# Patient Record
Sex: Male | Born: 1940 | Race: White | Hispanic: No | Marital: Married | Smoking: Never smoker
Health system: Southern US, Community
[De-identification: ages and names within clinical notes are randomized; demographics above are authoritative.]

## PROBLEM LIST (undated history)

## (undated) DIAGNOSIS — C8590 Non-Hodgkin lymphoma, unspecified, unspecified site: Secondary | ICD-10-CM

## (undated) DIAGNOSIS — G4730 Sleep apnea, unspecified: Secondary | ICD-10-CM

## (undated) DIAGNOSIS — R32 Unspecified urinary incontinence: Secondary | ICD-10-CM

## (undated) DIAGNOSIS — E222 Syndrome of inappropriate secretion of antidiuretic hormone: Secondary | ICD-10-CM

## (undated) DIAGNOSIS — M1990 Unspecified osteoarthritis, unspecified site: Secondary | ICD-10-CM

## (undated) DIAGNOSIS — G250 Essential tremor: Secondary | ICD-10-CM

## (undated) DIAGNOSIS — I4892 Unspecified atrial flutter: Secondary | ICD-10-CM

## (undated) DIAGNOSIS — I10 Essential (primary) hypertension: Secondary | ICD-10-CM

## (undated) DIAGNOSIS — E7800 Pure hypercholesterolemia, unspecified: Secondary | ICD-10-CM

## (undated) DIAGNOSIS — I639 Cerebral infarction, unspecified: Secondary | ICD-10-CM

## (undated) DIAGNOSIS — E119 Type 2 diabetes mellitus without complications: Secondary | ICD-10-CM

## (undated) HISTORY — PX: NASAL SINUS SURGERY: SHX719

## (undated) HISTORY — DX: Non-Hodgkin lymphoma, unspecified, unspecified site: C85.90

## (undated) HISTORY — PX: HERNIA REPAIR: SHX51

## (undated) HISTORY — DX: Cerebral infarction, unspecified: I63.9

## (undated) HISTORY — DX: Pure hypercholesterolemia, unspecified: E78.00

## (undated) HISTORY — PX: HIP FRACTURE SURGERY: SHX118

## (undated) HISTORY — DX: Sleep apnea, unspecified: G47.30

## (undated) HISTORY — PX: APPENDECTOMY: SHX54

## (undated) HISTORY — DX: Essential tremor: G25.0

## (undated) HISTORY — DX: Unspecified urinary incontinence: R32

## (undated) HISTORY — PX: CATARACT EXTRACTION, BILATERAL: SHX1313

## (undated) HISTORY — PX: HEMORRHOID SURGERY: SHX153

## (undated) HISTORY — PX: DEEP BRAIN STIMULATOR PLACEMENT: SHX608

## (undated) HISTORY — DX: Essential (primary) hypertension: I10

## (undated) HISTORY — DX: Unspecified osteoarthritis, unspecified site: M19.90

## (undated) HISTORY — PX: ABLATION: SHX5711

## (undated) HISTORY — PX: TONSILLECTOMY: SUR1361

## (undated) HISTORY — DX: Type 2 diabetes mellitus without complications: E11.9

## (undated) HISTORY — DX: Unspecified atrial flutter: I48.92

## (undated) HISTORY — PX: ORCHIECTOMY: SHX2116

---

## 2011-06-29 DIAGNOSIS — R279 Unspecified lack of coordination: Secondary | ICD-10-CM | POA: Insufficient documentation

## 2011-06-29 DIAGNOSIS — R269 Unspecified abnormalities of gait and mobility: Secondary | ICD-10-CM | POA: Insufficient documentation

## 2011-11-02 DIAGNOSIS — R2689 Other abnormalities of gait and mobility: Secondary | ICD-10-CM | POA: Insufficient documentation

## 2014-08-26 ENCOUNTER — Ambulatory Visit: Payer: Self-pay | Admitting: Physician Assistant

## 2014-11-07 DIAGNOSIS — N3946 Mixed incontinence: Secondary | ICD-10-CM | POA: Insufficient documentation

## 2014-11-07 DIAGNOSIS — G4730 Sleep apnea, unspecified: Secondary | ICD-10-CM | POA: Insufficient documentation

## 2014-11-07 DIAGNOSIS — E119 Type 2 diabetes mellitus without complications: Secondary | ICD-10-CM | POA: Insufficient documentation

## 2014-11-07 DIAGNOSIS — M129 Arthropathy, unspecified: Secondary | ICD-10-CM | POA: Insufficient documentation

## 2014-11-07 DIAGNOSIS — I4892 Unspecified atrial flutter: Secondary | ICD-10-CM | POA: Insufficient documentation

## 2014-11-07 DIAGNOSIS — E7800 Pure hypercholesterolemia, unspecified: Secondary | ICD-10-CM | POA: Insufficient documentation

## 2014-11-07 DIAGNOSIS — G250 Essential tremor: Secondary | ICD-10-CM | POA: Insufficient documentation

## 2014-11-07 DIAGNOSIS — I10 Essential (primary) hypertension: Secondary | ICD-10-CM | POA: Insufficient documentation

## 2014-11-21 DIAGNOSIS — R0681 Apnea, not elsewhere classified: Secondary | ICD-10-CM | POA: Insufficient documentation

## 2014-11-21 DIAGNOSIS — R0602 Shortness of breath: Secondary | ICD-10-CM | POA: Insufficient documentation

## 2015-04-03 ENCOUNTER — Ambulatory Visit (INDEPENDENT_AMBULATORY_CARE_PROVIDER_SITE_OTHER): Payer: Medicare Other | Admitting: Urology

## 2015-04-03 ENCOUNTER — Encounter: Payer: Self-pay | Admitting: Urology

## 2015-04-03 VITALS — BP 152/89 | HR 61 | Resp 18 | Ht 68.5 in | Wt 192.0 lb

## 2015-04-03 DIAGNOSIS — R32 Unspecified urinary incontinence: Secondary | ICD-10-CM

## 2015-04-03 DIAGNOSIS — C8590 Non-Hodgkin lymphoma, unspecified, unspecified site: Secondary | ICD-10-CM | POA: Insufficient documentation

## 2015-04-03 LAB — MICROSCOPIC EXAMINATION: Bacteria, UA: NONE SEEN

## 2015-04-03 LAB — URINALYSIS, COMPLETE
Bilirubin, UA: NEGATIVE
Glucose, UA: NEGATIVE
Ketones, UA: NEGATIVE
Leukocytes, UA: NEGATIVE
Nitrite, UA: NEGATIVE
Protein, UA: NEGATIVE
Specific Gravity, UA: 1.015 (ref 1.005–1.030)
Urobilinogen, Ur: 0.2 mg/dL (ref 0.2–1.0)
pH, UA: 7 (ref 5.0–7.5)

## 2015-04-03 NOTE — Progress Notes (Signed)
    Cystoscopy Procedure Note  Patient identification was confirmed, informed consent was obtained, and patient was prepped using Betadine solution.  Lidocaine jelly was administered per urethral meatus.    Preoperative abx where received prior to procedure.     Pre-Procedure: - Inspection reveals a normal caliber ureteral meatus.  Procedure: The flexible cystoscope was introduced without difficulty - No urethral strictures/lesions are present. - Normal prostate  - Normal bladder neck - Bilateral ureteral orifices identified - Bladder mucosa  reveals no ulcers, tumors, or lesions - No bladder stones - No trabeculation  Retroflexion shows no tumors or masses   Post-Procedure: - Patient tolerated the procedure well

## 2015-04-03 NOTE — Progress Notes (Signed)
04/03/2015 1:49 PM   Mike Holloway June 20, 1941 [id number]  Referring provider: No referring provider defined for this encounter.  Chief Complaint  Patient presents with  . Procedure    cysto.    HPI: This patient has a history of mixed incontinence returned determine which aspect is greater the elective aspect with overflow or the urge incontinence with overactive bladder component. Cystoscopy is planned today work should be viewed for the results of this male cystoscopy    PMH: Past Medical History  Diagnosis Date  . Non-Hodgkin lymphoma   . Diabetes mellitus   . Hypertension   . Hypercholesteremia   . Sleep apnea   . Incontinence   . Arthritis   . Atrial flutter   . Essential tremor   . Stroke   . Testicular cancer     Surgical History: Past Surgical History  Procedure Laterality Date  . Tonsillectomy      Home Medications:    Medication List       This list is accurate as of: 04/03/15  1:49 PM.  Always use your most recent med list.               aspirin 81 MG tablet  Take 81 mg by mouth daily.     azelastine 0.1 % nasal spray  Commonly known as:  ASTELIN  Place 2 sprays into both nostrils 2 (two) times daily. Use in each nostril as directed     BIOFREEZE EX  Apply topically.     diclofenac sodium 1 % Gel  Commonly known as:  VOLTAREN  Apply 2 g topically 4 (four) times daily.     famotidine 20 MG tablet  Commonly known as:  PEPCID  Take 20 mg by mouth 2 (two) times daily.     fluticasone 50 MCG/ACT nasal spray  Commonly known as:  FLONASE  Place 1 spray into both nostrils daily.     LACTOBACILLUS PO  Take by mouth.     levofloxacin 500 MG tablet  Commonly known as:  LEVAQUIN  Take 500 mg by mouth daily.     metFORMIN 500 MG tablet  Commonly known as:  GLUCOPHAGE  Take 500 mg by mouth 2 (two) times daily with a meal.     MIRALAX PO  Take by mouth.     montelukast 10 MG tablet  Commonly known as:  SINGULAIR  Take 10 mg by  mouth at bedtime.     primidone 50 MG tablet  Commonly known as:  MYSOLINE  Take 50 mg by mouth 3 (three) times daily.     propranolol ER 80 MG 24 hr capsule  Commonly known as:  INDERAL LA  Take 80 mg by mouth 2 (two) times daily.     tamsulosin 0.4 MG Caps capsule  Commonly known as:  FLOMAX  Take 0.4 mg by mouth daily.        Allergies:  Allergies  Allergen Reactions  . Penicillins   . Sulfa Antibiotics     Family History: Family History  Problem Relation Age of Onset  . Hematuria Father   . Prostate cancer Father   . Heart disease Mother     Social History:  reports that he has never smoked. He does not have any smokeless tobacco history on file. He reports that he does not drink alcohol. His drug history is not on file. Urological Symptom Review  Patient is experiencing the following symptoms: Frequent urination Hard to postpone urination Get  up at night to urinate Leakage of urine Stream starts and stops Trouble starting stream Have to strain to urinate Weak stream   Review of Systems  Gastrointestinal (upper)  : Negative for upper GI symptoms  Gastrointestinal (lower) : Negative for lower GI symptoms  Constitutional : Fatigue  Skin: Negative for skin symptoms  Eyes: Negative for eye symptoms  Ear/Nose/Throat : Negative for Ear/Nose/Throat symptoms  Hematologic/Lymphatic: Negative for Hematologic/Lymphatic symptoms  Cardiovascular : Negative for cardiovascular symptoms  Respiratory : Negative for respiratory symptoms  Endocrine: Negative for endocrine symptoms  Musculoskeletal: Back pain Joint pain  Neurological: Headaches  Psychologic: Depression Anxiety [id number] Symptom Review.buar   Patient is experiencing the following symptoms: Frequent urination Get up at night to urinate Leakage of urine Trouble starting stream Weak stream Erection problems (male only)   Review of Systems  Gastrointestinal (upper)   : Negative for upper GI symptoms  Gastrointestinal (lower) : Negative for lower GI symptoms  Constitutional : Fatigue  Skin: Negative for skin symptoms  Eyes: Negative for eye symptoms  Ear/Nose/Throat : Negative for Ear/Nose/Throat symptoms  Hematologic/Lymphatic: Negative for Hematologic/Lymphatic symptoms  Cardiovascular : Negative for cardiovascular symptoms  Respiratory : Negative for respiratory symptoms  Endocrine: Negative for endocrine symptoms  Musculoskeletal: Back pain Joint pain  Neurological: Headaches  Psychologic: Depression Anxiety   Physical Exam: There were no vitals taken for this visit.  Constitutional:  Alert and oriented, No acute distress. HEENT: [place] AT, moist mucus membranes.  Trachea midline, no masses. Cardiovascular: No clubbing, cyanosis, or edema. Respiratory: Normal respiratory effort, no increased work of breathing. GI: Abdomen is soft, nontender, nondistended, no abdominal masses GU: No CVA tenderness. Absent right testicle  Prostate small. No rectal masses small Skin: No rashes, bruises or suspicious lesions. Lymph: No cervical or inguinal adenopathy. Neurologic: Grossly intact, no focal deficits, moving all 4 extremities. Psychiatric: Normal mood and affect.  Laboratory Data: No results found for: WBC, HGB, HCT, MCV, PLT  No results found for: CREATININE  No results found for: PSA  No results found for: TESTOSTERONE  No results found for: HGBA1C  Urinalysis No results found for: COLORURINE, APPEARANCEUR, LABSPEC, PHURINE, GLUCOSEU, HGBUR, BILIRUBINUR, KETONESUR, PROTEINUR, UROBILINOGEN, NITRITE, LEUKOCYTESUR  Pertinent Imaging: None  Assessment & Plan:  Patient has a nonobstructive prostate and and apparently a nonobstructing appearing bladder. The bladder walls were smooth and the bladder capacity is limited to about 3-400 mL. He has an urge to void at that level. I left his bladder filled with the bout 400 mL  of fluid and he voided out all but 60 mL because of the mixed incontinence picture I have the family's desire to try pharmacologic manipulation first patient is going to be given samples of 5 mg of Vesicare daily for a month as well as maintaining himself on the tamsulosin will evaluate him in 6 weeks if the Vesicare is working well he will continue on it but by. at the pharmacy  1. Incontinence  - Urinalysis, Complete - BLADDER SCAN AMB NON-IMAGING   No Follow-up on file.  Collier Flowers, [facility] [address] [place], [place] [address] [phone]  From

## 2015-05-06 ENCOUNTER — Encounter: Payer: Self-pay | Admitting: Urology

## 2015-05-06 ENCOUNTER — Ambulatory Visit (INDEPENDENT_AMBULATORY_CARE_PROVIDER_SITE_OTHER): Payer: Medicare Other | Admitting: Urology

## 2015-05-06 VITALS — BP 138/83 | HR 59 | Ht 68.0 in | Wt 188.7 lb

## 2015-05-06 DIAGNOSIS — R339 Retention of urine, unspecified: Secondary | ICD-10-CM | POA: Diagnosis not present

## 2015-05-06 DIAGNOSIS — N3281 Overactive bladder: Secondary | ICD-10-CM

## 2015-05-06 LAB — BLADDER SCAN AMB NON-IMAGING: Scan Result: 389

## 2015-05-06 MED ORDER — SOLIFENACIN SUCCINATE 5 MG PO TABS: 5.0000 mg | ORAL_TABLET | Freq: Every day | ORAL | Status: DC

## 2015-05-06 NOTE — Progress Notes (Signed)
05/06/2015 3:10 PM   Mike Holloway 13-Oct-1940 [id number]  Referring provider: No referring provider defined for this encounter.  Chief Complaint  Patient presents with  . Follow-up    OAB    [id number] urinary incontinence with daily Vesicare creating a 300 mL residual in a patient with a nonneurogenic psychogenic bladder 4. This is caused by long-term ability to not void over hours it and including like the whole day. This situation was not relieved by TURP. Patient has no outlet obstruction we will put him on a decreased Vesicare recheck him in 2-3 months. HPI   PMH: Past Medical History  Diagnosis Date  . Non-Hodgkin lymphoma   . Diabetes mellitus   . Hypertension   . Hypercholesteremia   . Sleep apnea   . Incontinence   . Arthritis   . Atrial flutter   . Essential tremor   . Stroke   . Testicular cancer     Surgical History: Past Surgical History  Procedure Laterality Date  . Tonsillectomy      Home Medications:    Medication List       This list is accurate as of: 05/06/15  3:10 PM.  Always use your most recent med list.               aspirin 81 MG tablet  Take 81 mg by mouth daily.     azelastine 0.1 % nasal spray  Commonly known as:  ASTELIN  Place 2 sprays into both nostrils 2 (two) times daily. Use in each nostril as directed     BIOFREEZE EX  Apply topically.     diclofenac sodium 1 % Gel  Commonly known as:  VOLTAREN  Apply 2 g topically 4 (four) times daily.     famotidine 20 MG tablet  Commonly known as:  PEPCID  Take 20 mg by mouth 2 (two) times daily.     fluticasone 50 MCG/ACT nasal spray  Commonly known as:  FLONASE  Place 1 spray into both nostrils daily.     LACTOBACILLUS PO  Take by mouth.     metFORMIN 500 MG tablet  Commonly known as:  GLUCOPHAGE  Take 500 mg by mouth 2 (two) times daily with a meal.     MIRALAX PO  Take by mouth.     montelukast 10 MG tablet  Commonly known as:  SINGULAIR  Take 10 mg by  mouth at bedtime.     primidone 50 MG tablet  Commonly known as:  MYSOLINE  Take 50 mg by mouth 3 (three) times daily.     propranolol ER 80 MG 24 hr capsule  Commonly known as:  INDERAL LA  Take 80 mg by mouth 2 (two) times daily.     RA IBUPROFEN 200 MG Caps  Generic drug:  Ibuprofen  Take by mouth.     tamsulosin 0.4 MG Caps capsule  Commonly known as:  FLOMAX  Take 0.4 mg by mouth daily.     VITAMIN D-1000 MAX ST 1000 UNITS tablet  Generic drug:  Cholecalciferol  Take by mouth.        Allergies:  Allergies  Allergen Reactions  . Penicillins   . Sulfa Antibiotics     Family History: Family History  Problem Relation Age of Onset  . Hematuria Father   . Prostate cancer Father   . Heart disease Mother     Social History:  reports that he has never smoked. He does not have any  smokeless tobacco history on file. He reports that he does not drink alcohol. His drug history is not on file.  ROS: UROLOGY Frequent Urination?: Yes Hard to postpone urination?: Yes Burning/pain with urination?: No Get up at night to urinate?: No Leakage of urine?: No Urine stream starts and stops?: Yes Trouble starting stream?: Yes Do you have to strain to urinate?: Yes Blood in urine?: No Urinary tract infection?: No Sexually transmitted disease?: No Injury to kidneys or bladder?: No Painful intercourse?: No Weak stream?: Yes Erection problems?: Yes Penile pain?: No  Gastrointestinal Nausea?: No Vomiting?: No Indigestion/heartburn?: No Diarrhea?: No Constipation?: Yes  Constitutional Fever: No Night sweats?: No Weight loss?: No Fatigue?: Yes  Skin Skin rash/lesions?: No Itching?: No  Eyes Blurred vision?: No Double vision?: No  Ears/Nose/Throat Sore throat?: No Sinus problems?: No  Hematologic/Lymphatic Swollen glands?: No Easy bruising?: No  Cardiovascular Leg swelling?: No Chest pain?: No  Respiratory Cough?: Yes Shortness of breath?:  No  Endocrine Excessive thirst?: No  Musculoskeletal Back pain?: No Joint pain?: Yes  Neurological Headaches?: No Dizziness?: No  Psychologic Depression?: No Anxiety?: No  Physical Exam: BP 138/83 mmHg  Pulse 59  Ht 5\' 8"  (1.727 m)  Wt 188 lb 11.2 oz (85.594 kg)  BMI 28.70 kg/m2  Constitutional:  Alert and oriented, No acute distress. HEENT: [place] AT, moist mucus membranes.  Trachea midline, no masses. Cardiovascular: No clubbing, cyanosis, or edema. Respiratory: Normal respiratory effort, no increased work of breathing. GI: Abdomen is soft, nontender, nondistended, no abdominal masses GU: No CVA tenderness.she has a Skin: No rashes, bruises or suspicious lesions. Lymph: No cervical or inguinal adenopathy. Neurologic: Grossly intact, no focal deficits, moving all 4 extremities. Psychiatric: Normal mood and affect.  Laboratory Data: No results found for: WBC, HGB, HCT, MCV, PLT  No results found for: CREATININE  No results found for: PSA  No results found for: TESTOSTERONE  No results found for: HGBA1C  Urinalysis    Component Value Date/Time   GLUCOSEU Negative 04/03/2015 1402   BILIRUBINUR Negative 04/03/2015 1402   NITRITE Negative 04/03/2015 1402   LEUKOCYTESUR Negative 04/03/2015 1402    Pertinent Imaging:next incontinence and retention is difficult to deal with. His postvoid residual is 300.  Assessment and Plan: placed on Vesicare 5 mg every other day postvoid residual is 300I think this regimen of Vesicare at 5 mg allow him to void as he has no obstruction and no good neurologic anatomic reason for his attention other than having had long-term urinary retention. Y states that he could drive 4 hours and she thought he hadI think this contributed to a long-term cycle neurogenic type retention which was not relieved by I prostate resection Huge bladder capacity       Problem List Items Addressed This Visit    None      No Follow-up on  file.  Collier Flowers, [facility] [address], [place], [place] [address] [phone]

## 2015-06-10 ENCOUNTER — Encounter: Payer: Self-pay | Admitting: Urology

## 2015-06-10 ENCOUNTER — Ambulatory Visit (INDEPENDENT_AMBULATORY_CARE_PROVIDER_SITE_OTHER): Payer: Medicare Other | Admitting: Urology

## 2015-06-10 VITALS — BP 159/86 | HR 57 | Ht 68.0 in | Wt 191.3 lb

## 2015-06-10 DIAGNOSIS — N3946 Mixed incontinence: Secondary | ICD-10-CM | POA: Diagnosis not present

## 2015-06-10 LAB — MICROSCOPIC EXAMINATION
Bacteria, UA: NONE SEEN
Epithelial Cells (non renal): NONE SEEN /hpf (ref 0–10)
WBC, UA: NONE SEEN /hpf (ref 0–?)

## 2015-06-10 LAB — URINALYSIS, COMPLETE
Bilirubin, UA: NEGATIVE
Glucose, UA: NEGATIVE
Ketones, UA: NEGATIVE
Leukocytes, UA: NEGATIVE
Nitrite, UA: NEGATIVE
Protein, UA: NEGATIVE
Specific Gravity, UA: 1.02 (ref 1.005–1.030)
Urobilinogen, Ur: 0.2 mg/dL (ref 0.2–1.0)
pH, UA: 6 (ref 5.0–7.5)

## 2015-06-10 LAB — BLADDER SCAN AMB NON-IMAGING

## 2015-06-10 NOTE — Progress Notes (Signed)
06/10/2015 1:13 PM   Mike Holloway 18-Nov-1940 [id number]  Referring provider: [name], MD [place], [place] [address]  Chief Complaint  Patient presents with  . Urinary Incontinence    67month    HPI: I am saw this patient over the last year. He was treated in [place] by a urologist. He has both mother urologic problems and obstructive-type uropathy that down has responded well to tamsulosin close and combined with a low dose of Vesicare. He now has 0 residuals instead a 3-400 mL residuals. His stream is usually good. His wife states she can hear him going most of the time but not all patient is very pleased. He'll be following up with [place] in 6 months he N stay on his present medication combination of the anticholinergic urinary alpha-blocker this seems to solve his mixed incontinence retention problem     PMH: Past Medical History  Diagnosis Date  . Non-Hodgkin lymphoma   . Diabetes mellitus   . Hypertension   . Hypercholesteremia   . Sleep apnea   . Incontinence   . Arthritis   . Atrial flutter   . Essential tremor   . Stroke   . Testicular cancer     Surgical History: Past Surgical History  Procedure Laterality Date  . Tonsillectomy      Home Medications:    Medication List       This list is accurate as of: 06/10/15  1:13 PM.  Always use your most recent med list.               aspirin 81 MG tablet  Take 81 mg by mouth daily.     azelastine 0.1 % nasal spray  Commonly known as:  ASTELIN  Place 2 sprays into both nostrils 2 (two) times daily. Use in each nostril as directed     BIOFREEZE EX  Apply topically.     diclofenac sodium 1 % Gel  Commonly known as:  VOLTAREN  Apply 2 g topically 4 (four) times daily.     famotidine 20 MG tablet  Commonly known as:  PEPCID  Take 20 mg by mouth 2 (two) times daily.     fluticasone 50 MCG/ACT nasal spray  Commonly known as:  FLONASE  Place 1 spray into both nostrils daily.       LACTOBACILLUS PO  Take by mouth.     MIRALAX PO  Take by mouth.     montelukast 10 MG tablet  Commonly known as:  SINGULAIR  Take 10 mg by mouth at bedtime.     primidone 50 MG tablet  Commonly known as:  MYSOLINE  Take 50 mg by mouth 3 (three) times daily.     propranolol ER 80 MG 24 hr capsule  Commonly known as:  INDERAL LA  Take 80 mg by mouth 2 (two) times daily.     RA IBUPROFEN 200 MG Caps  Generic drug:  Ibuprofen  Take by mouth.     solifenacin 5 MG tablet  Commonly known as:  VESICARE  Take 1 tablet (5 mg total) by mouth daily.     tamsulosin 0.4 MG Caps capsule  Commonly known as:  FLOMAX  Take 0.4 mg by mouth daily.     VITAMIN D-1000 MAX ST 1000 UNITS tablet  Generic drug:  Cholecalciferol  Take by mouth.        Allergies:  Allergies  Allergen Reactions  . Ambien  [Zolpidem]  Other reaction(s): Other (See Comments) Disoriented and moody  . Penicillins   . Sulfa Antibiotics     Family History: Family History  Problem Relation Age of Onset  . Hematuria Father   . Prostate cancer Father   . Heart disease Mother     Social History:  reports that he has never smoked. He does not have any smokeless tobacco history on file. He reports that he does not drink alcohol. His drug history is not on file.  ROS:                                        Physical Exam: BP 159/86 mmHg  Pulse 57  Ht 5\' 8"  (1.727 m)  Wt 191 lb 4.8 oz (86.773 kg)  BMI 29.09 kg/m2  Constitutional:  Alert and oriented, No acute distress. HEENT: [place] AT, moist mucus membranes.  Trachea midline, no masses. Cardiovascular: No clubbing, cyanosis, or edema. Respiratory: Normal respiratory effort, no increased work of breathing. GI: Abdomen is soft, nontender, nondistended, no abdominal masses GU: No CVA tenderness.  Skin: No rashes, bruises or suspicious lesions. Lymph: No cervical or inguinal adenopathy. Neurologic: Grossly intact, no focal  deficits, moving all 4 extremities. Psychiatric: Normal mood and affect.  Laboratory Data: No results found for: WBC, HGB, HCT, MCV, PLT  No results found for: CREATININE  No results found for: PSA  No results found for: TESTOSTERONE  No results found for: HGBA1C  Urinalysis    Component Value Date/Time   GLUCOSEU Negative 04/03/2015 1402   BILIRUBINUR Negative 04/03/2015 1402   NITRITE Negative 04/03/2015 1402   LEUKOCYTESUR Negative 04/03/2015 1402    Pertinent Imaging: None  Assessment & Plan:  Mixed incontinence with good recovery results utilizing Vesicare and tamsulosin. Patient has no residual is voiding well and is very pleased   1. Mixed incontinence Urinary incontinence - Urinalysis, Complete - BLADDER SCAN AMB NON-IMAGING   No Follow-up on file.  Collier Flowers, [facility] [address] [place], [place] [address] [phone]

## 2015-09-08 DIAGNOSIS — R339 Retention of urine, unspecified: Secondary | ICD-10-CM | POA: Insufficient documentation

## 2015-12-09 ENCOUNTER — Ambulatory Visit: Payer: Medicare Other

## 2015-12-10 ENCOUNTER — Ambulatory Visit: Payer: Medicare Other

## 2015-12-11 ENCOUNTER — Ambulatory Visit (INDEPENDENT_AMBULATORY_CARE_PROVIDER_SITE_OTHER): Payer: Medicare Other | Admitting: Urology

## 2015-12-11 ENCOUNTER — Encounter: Payer: Self-pay | Admitting: Urology

## 2015-12-11 DIAGNOSIS — N3946 Mixed incontinence: Secondary | ICD-10-CM

## 2015-12-11 DIAGNOSIS — N3281 Overactive bladder: Secondary | ICD-10-CM

## 2015-12-11 DIAGNOSIS — C8590 Non-Hodgkin lymphoma, unspecified, unspecified site: Secondary | ICD-10-CM | POA: Insufficient documentation

## 2015-12-11 DIAGNOSIS — N400 Enlarged prostate without lower urinary tract symptoms: Secondary | ICD-10-CM

## 2015-12-11 LAB — URINALYSIS, COMPLETE
Bilirubin, UA: NEGATIVE
Glucose, UA: NEGATIVE
Ketones, UA: NEGATIVE
Leukocytes, UA: NEGATIVE
Nitrite, UA: NEGATIVE
Protein, UA: NEGATIVE
Specific Gravity, UA: 1.02 (ref 1.005–1.030)
Urobilinogen, Ur: 0.2 mg/dL (ref 0.2–1.0)
pH, UA: 6 (ref 5.0–7.5)

## 2015-12-11 LAB — MICROSCOPIC EXAMINATION
Bacteria, UA: NONE SEEN
Epithelial Cells (non renal): NONE SEEN /hpf (ref 0–10)
WBC, UA: NONE SEEN /hpf (ref 0–?)

## 2015-12-11 MED ORDER — MIRABEGRON ER 50 MG PO TB24: 50.0000 mg | ORAL_TABLET | Freq: Every day | ORAL | Status: DC

## 2015-12-11 NOTE — Progress Notes (Signed)
12/11/2015 3:16 PM   Mike Holloway 03/07/1941 [id number]  Referring provider: [name], MD Mike Holloway, [name]  Chief Complaint  Patient presents with  . Follow-up    49mth mixed incontinence     HPI: The patient is a 76 year old gentleman who recently moved from [place], [place] with history of BPH and overactive bladder who is well controlled with tamsulosin and low-dose Vesicare presents today for follow-up. He had a history of high post void residuals around 400. It has now decreased to near zero.Today ot is 31 cc. 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 Since that time he has had issues with urinary urgency, urge incontinence, frequency, and weak stream. He has been on Flomax and Vesicare for some time now. They are concerned because of Vesicare causes some mental status changes as well as very expensive. He has been on Ditropan in the past, however this caused severe changes in mental status. He was on Myrbetriq as well, however this is extremely expensive. His I PSS score today is 19/3. He denies nocturia, but he has symptoms in every other category.  PVR: 31 cc  PMH: Past Medical History  Diagnosis Date  . 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[place])   . Diabetes mellitus ([place])   . Hypertension   . Hypercholesteremia   . Sleep apnea   . Incontinence   . Arthritis   . Atrial flutter ([place])   . Essential tremor   . Stroke ([place])   . Testicular cancer [facility])     Surgical History: Past Surgical History  Procedure Laterality Date  . Tonsillectomy    . Appendectomy    . Hernia repair    . Orchiectomy    . Deep brain stimulator placement    . Ablation      Home Medications:    Medication List       This list is accurate as of: 12/11/15  3:16 PM.  Always use your most recent med list.               aspirin 81 MG  tablet  Take 81 mg by mouth daily.     azelastine 0.1 % nasal spray  Commonly known as:  ASTELIN  Place 2 sprays into both nostrils 2 (two) times daily. Use in each nostril as directed     diclofenac sodium 1 % Gel  Commonly known as:  VOLTAREN  Apply 2 g topically 4 (four) times daily.     famotidine 20 MG tablet  Commonly known as:  PEPCID  Take 20 mg by mouth 2 (two) times daily.     fluticasone 50 MCG/ACT nasal spray  Commonly known as:  FLONASE  Place 1 spray into both nostrils daily. Reported on 12/11/2015     LACTOBACILLUS PO  Take by mouth.     Menthol (Topical Analgesic) 10 % Liqd     MIRALAX PO  Take by mouth.     montelukast 10 MG tablet  Commonly known as:  SINGULAIR  Take 10 mg by mouth at bedtime.     primidone 50 MG tablet  Commonly known as:  MYSOLINE  Take 50 mg by mouth 3 (three) times daily.     propranolol ER 80 MG 24 hr capsule  Commonly known as:  INDERAL LA  Take 80 mg by mouth 2 (two) times daily.  RA IBUPROFEN 200 MG Caps  Generic drug:  Ibuprofen  Take by mouth.     solifenacin 5 MG tablet  Commonly known as:  VESICARE  Take 1 tablet (5 mg total) by mouth daily.     tamsulosin 0.4 MG Caps capsule  Commonly known as:  FLOMAX  Take 0.4 mg by mouth daily.     VITAMIN D-1000 MAX ST 1000 units tablet  Generic drug:  Cholecalciferol  Take by mouth.        Allergies:  Allergies  Allergen Reactions  . Clindamycin Diarrhea    Contracted C. Diff x 2  . Ambien  [Zolpidem]     Other reaction(s): Other (See Comments) Disoriented and moody Other reaction(s): Other (See Comments) Disoriented and moody  . Penicillins   . Sulfa Antibiotics     Family History: Family History  Problem Relation Age of Onset  . Hematuria Father   . Prostate cancer Father   . Heart disease Mother     Social History:  reports that he has never smoked. He does not have any smokeless tobacco history on file. He reports that he does not drink alcohol.  His drug history is not on file.  ROS: UROLOGY Frequent Urination?: Yes Hard to postpone urination?: Yes Burning/pain with urination?: No Get up at night to urinate?: No Leakage of urine?: Yes Urine stream starts and stops?: Yes Trouble starting stream?: Yes Do you have to strain to urinate?: No Blood in urine?: No Urinary tract infection?: No Sexually transmitted disease?: No Injury to kidneys or bladder?: No Painful intercourse?: No Weak stream?: Yes Erection problems?: Yes Penile pain?: No  Gastrointestinal Nausea?: No Vomiting?: No Indigestion/heartburn?: No Diarrhea?: No Constipation?: No  Constitutional Fever: No Night sweats?: No Weight loss?: No Fatigue?: No  Skin Skin rash/lesions?: No Itching?: No  Eyes Blurred vision?: No Double vision?: No  Ears/Nose/Throat Sore throat?: No Sinus problems?: Yes  Hematologic/Lymphatic Swollen glands?: No Easy bruising?: No  Cardiovascular Leg swelling?: No Chest pain?: No  Respiratory Cough?: No Shortness of breath?: No  Endocrine Excessive thirst?: No  Musculoskeletal Back pain?: No Joint pain?: No  Neurological Headaches?: No Dizziness?: No  Psychologic Depression?: No Anxiety?: No  Physical Exam: There were no vitals taken for this visit.  Constitutional:  Alert and oriented, No acute distress. HEENT: [place] AT, moist mucus membranes.  Trachea midline, no masses. Cardiovascular: No clubbing, cyanosis, or edema. Respiratory: Normal respiratory effort, no increased work of breathing. GI: Abdomen is soft, nontender, nondistended, no abdominal masses GU: No CVA tenderness.  Skin: No rashes, bruises or suspicious lesions. Lymph: No cervical or inguinal adenopathy. Neurologic: Grossly intact, no focal deficits, moving all 4 extremities. Psychiatric: Normal mood and affect.  Laboratory Data: No results found for: WBC, HGB, HCT, MCV, PLT  No results found for: CREATININE  No results found  for: PSA  No results found for: TESTOSTERONE  No results found for: HGBA1C  Urinalysis    Component Value Date/Time   APPEARANCEUR Clear 06/10/2015 1138   GLUCOSEU Negative 06/10/2015 1138   BILIRUBINUR Negative 06/10/2015 1138   PROTEINUR Negative 06/10/2015 1138   NITRITE Negative 06/10/2015 1138   LEUKOCYTESUR Negative 06/10/2015 1138     Assessment & Plan:   1 BPH Continue Flomax   2.Overactive Bladder/Radiation cystitis We will switch the patient to Myrbetriq as she does not tolerate anticholinergics due to mental status changes. He was given samples of Myrbetriq 50 mg daily. Again he is not a candidate for other medications  that are anticholinergics due to his mental status changes. He will follow-up in one month so we can assess how the Myrbetriq is working as well as check a PVR/IPSS.   Return in about 4 weeks (around 01/08/2016).  Nickie Retort, MD  [facility] [address] [place], [place] [address] [phone]

## 2015-12-11 NOTE — Progress Notes (Signed)
Bladder Scan Patient  void: 31 ml Performed By: K.Russell,CMA 

## 2016-01-01 ENCOUNTER — Telehealth: Payer: Self-pay | Admitting: Urology

## 2016-01-01 NOTE — Telephone Encounter (Signed)
Almyra Free from [facility] called to inform us that Dr. Pilar Jarvis sent a Rx for Myrbetriq for Mr. Rumph that's been denied. The non-formulary exception request 50mg  has been denied and there are alternate medications available on the formulary. They'll mail a letter to the patient and our office regarding this. If you have any questions, please feel free to contact [place].  [place] ph# [phone] Thank you.

## 2016-01-05 NOTE — Telephone Encounter (Signed)
What other medications can pt try?

## 2016-01-07 NOTE — Telephone Encounter (Signed)
An appeal has been submitted 

## 2016-01-07 NOTE — Telephone Encounter (Signed)
The patient is not a candidate for anticholinergics due his history of mental status changes. Anticholinergics (All other overactive bladder medications) are contraindicated in this patient and cannot be safely prescribed. Please let the insurance company know this. Mybetriq is the ONLY medication that he can safely take.

## 2016-01-12 ENCOUNTER — Telehealth: Payer: Self-pay

## 2016-01-12 NOTE — Telephone Encounter (Signed)
Received a denial letter from [employer] a second time in reference to [employer]. Therefore reps will provided medication for pt. Wife made aware.

## 2016-01-15 ENCOUNTER — Encounter: Payer: Self-pay | Admitting: Urology

## 2016-01-15 ENCOUNTER — Ambulatory Visit (INDEPENDENT_AMBULATORY_CARE_PROVIDER_SITE_OTHER): Payer: Medicare Other | Admitting: Urology

## 2016-01-15 ENCOUNTER — Telehealth: Payer: Self-pay | Admitting: Urology

## 2016-01-15 VITALS — BP 153/87 | HR 62 | Ht 68.5 in | Wt 195.0 lb

## 2016-01-15 DIAGNOSIS — N3281 Overactive bladder: Secondary | ICD-10-CM | POA: Diagnosis not present

## 2016-01-15 DIAGNOSIS — N400 Enlarged prostate without lower urinary tract symptoms: Secondary | ICD-10-CM | POA: Diagnosis not present

## 2016-01-15 LAB — MICROSCOPIC EXAMINATION
Bacteria, UA: NONE SEEN
Epithelial Cells (non renal): NONE SEEN /hpf (ref 0–10)
WBC, UA: NONE SEEN /hpf (ref 0–?)

## 2016-01-15 LAB — URINALYSIS, COMPLETE
Bilirubin, UA: NEGATIVE
Glucose, UA: NEGATIVE
Ketones, UA: NEGATIVE
Leukocytes, UA: NEGATIVE
Nitrite, UA: NEGATIVE
Protein, UA: NEGATIVE
RBC, UA: NEGATIVE
Specific Gravity, UA: 1.015 (ref 1.005–1.030)
Urobilinogen, Ur: 0.2 mg/dL (ref 0.2–1.0)
pH, UA: 6.5 (ref 5.0–7.5)

## 2016-01-15 NOTE — Progress Notes (Signed)
01/15/2016 11:38 AM   Mike Holloway Jul 05, 1941 [id number]  Referring provider: [name], MD [place], [place] [address]  Chief Complaint  Patient presents with  . Follow-up    BPH    HPI: The patient is a 75 year old gentleman who recently moved from [place], [place] with history of BPH and overactive bladder who is well controlled with tamsulosin and low-dose Vesicare presents today for follow-up. He had a history of high post void residuals around 400. It has now decreased to near zero.Today ot is 31 cc. 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 Since that time he has had issues with urinary urgency, urge incontinence, frequency, and weak stream. He has been on Flomax and Vesicare for some time now. They are concerned because of Vesicare causes some mental status changes as well as very expensive. He has been on Ditropan in the past, however this caused severe changes in mental status. He was on Myrbetriq as well, however this is extremely expensive. His I PSS score today is 19/3. He denies nocturia, but he has symptoms in every other category.  PVR: 31 cc    April 2017 Interval History: He returns to day with IPSS of 20/3. He again has symptoms in every category except for nocturia which he does not have.Complaint is in the feeling of incomplete emptying. His second biggest complaints are frequency and weak stream.  He states his symptoms are worse and sometimes better on other days. His frequency is very bothersome. His PVR today is 140 cc.  PMH: Past Medical History  Diagnosis Date  . 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[place])   . Diabetes mellitus ([place])   . Hypertension   . Hypercholesteremia   . Sleep apnea   . Incontinence   . Arthritis   . Atrial flutter ([place])   . Essential tremor   . Stroke ([place])   . 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 Tonsillectomy    . Appendectomy    . Hernia repair    . Orchiectomy    . Deep brain stimulator placement    . Ablation      Home Medications:    Medication List       This list is accurate as of: 01/15/16 11:38 AM.  Always use your most recent med list.               aspirin 81 MG tablet  Take 81 mg by mouth daily.     azelastine 0.1 % nasal spray  Commonly known as:  ASTELIN  Place 2 sprays into both nostrils 2 (two) times daily. Use in each nostril as directed     diclofenac sodium 1 % Gel  Commonly known as:  VOLTAREN  Apply 2 g topically 4 (four) times daily.     famotidine 20 MG tablet  Commonly known as:  PEPCID  Take 20 mg by mouth 2 (two) times daily.     fluticasone 50 MCG/ACT nasal spray  Commonly known as:  FLONASE  Place 1 spray into both nostrils daily. Reported on 01/15/2016     LACTOBACILLUS PO  Take by mouth.     Menthol (Topical Analgesic) 10 % Liqd     mirabegron ER 50 MG Tb24 tablet  Commonly known as:  MYRBETRIQ  Take  1 tablet (50 mg total) by mouth daily.     MIRALAX PO  Take by mouth.     montelukast 10 MG tablet  Commonly known as:  SINGULAIR  Take 10 mg by mouth at bedtime.     primidone 50 MG tablet  Commonly known as:  MYSOLINE  Take 50 mg by mouth 3 (three) times daily.     propranolol ER 80 MG 24 hr capsule  Commonly known as:  INDERAL LA  Take 80 mg by mouth 2 (two) times daily.     RA IBUPROFEN 200 MG Caps  Generic drug:  Ibuprofen  Take by mouth.     tamsulosin 0.4 MG Caps capsule  Commonly known as:  FLOMAX  Take 0.4 mg by mouth daily.     VITAMIN D-1000 MAX ST 1000 units tablet  Generic drug:  Cholecalciferol  Take by mouth.        Allergies:  Allergies  Allergen Reactions  . Clindamycin Diarrhea    Contracted C. Diff x 2  . Ambien  [Zolpidem]     Other reaction(s): Other (See Comments) Disoriented and moody Other  reaction(s): Other (See Comments) Disoriented and moody  . Penicillins   . Sulfa Antibiotics     Family History: Family History  Problem Relation Age of Onset  . Hematuria Father   . Prostate cancer Father   . Heart disease Mother     Social History:  reports that he has never smoked. He does not have any smokeless tobacco history on file. He reports that he does not drink alcohol. His drug history is not on file.  ROS: UROLOGY Frequent Urination?: Yes Hard to postpone urination?: Yes Burning/pain with urination?: No Get up at night to urinate?: No Leakage of urine?: Yes Urine stream starts and stops?: Yes Trouble starting stream?: Yes Do you have to strain to urinate?: Yes Blood in urine?: No Urinary tract infection?: No Sexually transmitted disease?: No Injury to kidneys or bladder?: No Painful intercourse?: No Weak stream?: Yes Erection problems?: Yes Penile pain?: No  Gastrointestinal Nausea?: No Vomiting?: No Indigestion/heartburn?: No Diarrhea?: Yes Constipation?: Yes  Constitutional Fever: No Night sweats?: No Weight loss?: No Fatigue?: No  Skin Skin rash/lesions?: No Itching?: No  Eyes Blurred vision?: No Double vision?: No  Ears/Nose/Throat Sore throat?: No Sinus problems?: Yes  Hematologic/Lymphatic Swollen glands?: No Easy bruising?: No  Cardiovascular Leg swelling?: Yes Chest pain?: No  Respiratory Cough?: Yes Shortness of breath?: No  Endocrine Excessive thirst?: No  Musculoskeletal Back pain?: No Joint pain?: Yes  Neurological Headaches?: No Dizziness?: No  Psychologic Depression?: No Anxiety?: No  Physical Exam: BP 153/87 mmHg  Pulse 62  Ht 5' 8.5" (1.74 m)  Wt 195 lb (88.451 kg)  BMI 29.21 kg/m2  Constitutional:  Alert and oriented, No acute distress. HEENT: [place] AT, moist mucus membranes.  Trachea midline, no masses. Cardiovascular: No clubbing, cyanosis, or edema. Respiratory: Normal respiratory effort, no  increased work of breathing. GI: Abdomen is soft, nontender, nondistended, no abdominal masses GU: No CVA tenderness.  Skin: No rashes, bruises or suspicious lesions. Lymph: No cervical or inguinal adenopathy. Neurologic: Grossly intact, no focal deficits, moving all 4 extremities. Psychiatric: Normal mood and affect.  Laboratory Data: No results found for: WBC, HGB, HCT, MCV, PLT  No results found for: CREATININE  No results found for: PSA  No results found for: TESTOSTERONE  No results found for: HGBA1C  Urinalysis    Component Value Date/Time   APPEARANCEUR Clear  12/11/2015 1416   GLUCOSEU Negative 12/11/2015 1416   BILIRUBINUR Negative 12/11/2015 1416   PROTEINUR Negative 12/11/2015 1416   NITRITE Negative 12/11/2015 1416   LEUKOCYTESUR Negative 12/11/2015 1416    Assessment & Plan:    1 BPH Continue Flomax   2.Overactive Bladder/Radiation cystitis We'll continue the patient Myrbetriq 50 mg daily. He was able to get charity medication through our Myrbetriq wrap. I have given him a voiding diary to better elicit how his frequency varies from day to day. He'll follow-up in one month after finishing his voiding diary. We may discuss PTNS at that time.   No Follow-up on file.  Nickie Retort, MD  [facility] [address] [place], [place] [address] [phone]

## 2016-01-15 NOTE — Telephone Encounter (Signed)
Auth# [id number] Ron from [employer] called with the authorization for Myrbetriq today and will fax the information to our office at some point today. Pt has been notified as well by Ron. Thank you.

## 2016-02-12 ENCOUNTER — Ambulatory Visit: Payer: Medicare Other

## 2016-02-26 ENCOUNTER — Ambulatory Visit (INDEPENDENT_AMBULATORY_CARE_PROVIDER_SITE_OTHER): Payer: Medicare Other | Admitting: Urology

## 2016-02-26 ENCOUNTER — Encounter: Payer: Self-pay | Admitting: Urology

## 2016-02-26 VITALS — BP 159/82 | HR 64 | Ht 68.0 in | Wt 196.8 lb

## 2016-02-26 DIAGNOSIS — N400 Enlarged prostate without lower urinary tract symptoms: Secondary | ICD-10-CM

## 2016-02-26 DIAGNOSIS — N3281 Overactive bladder: Secondary | ICD-10-CM

## 2016-02-26 NOTE — Progress Notes (Signed)
02/26/2016 12:08 PM   Mike Holloway Apr 04, 1941 [id number]  Referring provider: [name], MD [place], [place] [address]  Chief Complaint  Patient presents with  . Follow-up    HPI: The patient is a 75 year old gentleman who recently moved from [place], [place] with history of BPH and overactive bladder who is well controlled with tamsulosin and low-dose Vesicare presents today for follow-up. He had a history of high post void residuals around 400. It has now decreased to near zero.Today it is 31 cc. 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 Since that time he has had issues with urinary urgency, urge incontinence, frequency, and weak stream. He has been on Flomax and Vesicare for some time now. They are concerned because of Vesicare causes some mental status changes as well as very expensive. He has been on Ditropan in the past, however this caused severe changes in mental status. He was on Myrbetriq as well, however this is extremely expensive. His I PSS score today is 19/3. He denies nocturia, but he has symptoms in every other category.  PVR: 31 cc   April 2017 Interval History: He returns to day with IPSS of 20/3. He again has symptoms in every category except for nocturia which he does not have.Complaint is in the feeling of incomplete emptying. His second biggest complaints are frequency and weak stream.  He states his symptoms are worse and sometimes better on other days. His frequency is very bothersome. His PVR today is 140 cc.    May 2017 Interval History: The patient returns with his voiding diary. He is voiding every 1-4 hours for the most part. He probably averages of void every 2 hours. His findings ranged from 100-300 cc. His average voided volume is approximately 150 cc. In the morning, though he often has some  small volume voids shortly after voiding. He has not been double voiding though this is been discussed with him by previous physicians.  Though his I PSS hasn't greatly improved as discussed below, he is very happy with his improvement. Particulate, so he already has urinary urgency or urge incontinence. This dramatically improved his urinary quality of life.  He continues to have complaints in every category of the IPSS score sheet except for nocturia which he does not have. Feeling of incomplete emptying is his biggest complaint.  IPSS: 18/3 PVR:  103  PMH: Past Medical History  Diagnosis Date  . 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[place])   . Diabetes mellitus ([place])   . Hypertension   . Hypercholesteremia   . Sleep apnea   . Incontinence   . Arthritis   . Atrial flutter ([place])   . Essential tremor   . Stroke ([place])   . 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 Tonsillectomy    . Appendectomy    . Hernia repair    . Orchiectomy    . Deep brain stimulator placement    . Ablation      Home Medications:    Medication List       This list is accurate as of: 02/26/16 12:08 PM.  Always use your most recent med list.               aspirin 81 MG tablet  Take 81 mg by mouth daily.  diclofenac sodium 1 % Gel  Commonly known as:  VOLTAREN  Apply 2 g topically 4 (four) times daily.     famotidine 20 MG tablet  Commonly known as:  PEPCID  Take 20 mg by mouth 2 (two) times daily.     fluticasone 50 MCG/ACT nasal spray  Commonly known as:  FLONASE  Place 1 spray into both nostrils daily. Reported on 01/15/2016     LACTOBACILLUS PO  Take by mouth.     Menthol (Topical Analgesic) 10 % Liqd     mirabegron ER 50 MG Tb24 tablet  Commonly known as:  MYRBETRIQ  Take 1 tablet (50 mg total) by mouth daily.     MIRALAX PO  Take by mouth.     montelukast 10 MG tablet  Commonly known as:  SINGULAIR  Take 10 mg by mouth at  bedtime.     NASACORT AQ NA  Place into the nose.     primidone 50 MG tablet  Commonly known as:  MYSOLINE  Take 50 mg by mouth 3 (three) times daily.     propranolol ER 80 MG 24 hr capsule  Commonly known as:  INDERAL LA  Take 80 mg by mouth 2 (two) times daily.     RA IBUPROFEN 200 MG Caps  Generic drug:  Ibuprofen  Take by mouth.     tamsulosin 0.4 MG Caps capsule  Commonly known as:  FLOMAX  Take 0.4 mg by mouth daily.     VITAMIN D-1000 MAX ST 1000 units tablet  Generic drug:  Cholecalciferol  Take by mouth.        Allergies:  Allergies  Allergen Reactions  . Clindamycin Diarrhea    Contracted C. Diff x 2  . Ambien  [Zolpidem]     Other reaction(s): Other (See Comments) Disoriented and moody Other reaction(s): Other (See Comments) Disoriented and moody  . Penicillins   . Sulfa Antibiotics     Family History: Family History  Problem Relation Age of Onset  . Hematuria Father   . Prostate cancer Father   . Heart disease Mother     Social History:  reports that he has never smoked. He does not have any smokeless tobacco history on file. He reports that he does not drink alcohol. His drug history is not on file.  ROS: UROLOGY Frequent Urination?: Yes Hard to postpone urination?: Yes Burning/pain with urination?: No Get up at night to urinate?: No Leakage of urine?: No Urine stream starts and stops?: Yes Trouble starting stream?: Yes Do you have to strain to urinate?: No Blood in urine?: No Urinary tract infection?: No Sexually transmitted disease?: No Injury to kidneys or bladder?: No Painful intercourse?: No Weak stream?: Yes Erection problems?: Yes Penile pain?: No  Gastrointestinal Nausea?: No Vomiting?: No Indigestion/heartburn?: No Diarrhea?: No Constipation?: No  Constitutional Fever: No Night sweats?: No Weight loss?: No Fatigue?: No  Skin Skin rash/lesions?: No Itching?: No  Eyes Blurred vision?: No Double vision?:  No  Ears/Nose/Throat Sore throat?: No Sinus problems?: Yes  Hematologic/Lymphatic Swollen glands?: No Easy bruising?: No  Cardiovascular Leg swelling?: No Chest pain?: No  Respiratory Cough?: No Shortness of breath?: No  Endocrine Excessive thirst?: No  Musculoskeletal Back pain?: No Joint pain?: Yes  Neurological Headaches?: No Dizziness?: No  Psychologic Depression?: No Anxiety?: No  Physical Exam: BP 159/82 mmHg  Pulse 64  Ht 5\' 8"  (1.727 m)  Wt 196 lb 12.8 oz (89.268 kg)  BMI 29.93 kg/m2  Constitutional:  Alert and oriented, No acute distress. HEENT: [place] AT, moist mucus membranes.  Trachea midline, no masses. Cardiovascular: No clubbing, cyanosis, or edema. Respiratory: Normal respiratory effort, no increased work of breathing. GI: Abdomen is soft, nontender, nondistended, no abdominal masses GU: No CVA tenderness.  Skin: No rashes, bruises or suspicious lesions. Lymph: No cervical or inguinal adenopathy. Neurologic: Grossly intact, no focal deficits, moving all 4 extremities. Psychiatric: Normal mood and affect.  Laboratory Data: No results found for: WBC, HGB, HCT, MCV, PLT  No results found for: CREATININE  No results found for: PSA  No results found for: TESTOSTERONE  No results found for: HGBA1C  Urinalysis    Component Value Date/Time   APPEARANCEUR Clear 01/15/2016 1145   GLUCOSEU Negative 01/15/2016 1145   BILIRUBINUR Negative 01/15/2016 1145   PROTEINUR Negative 01/15/2016 1145   NITRITE Negative 01/15/2016 1145   LEUKOCYTESUR Negative 01/15/2016 1145      Assessment & Plan:    The patient's urinary urgency and urge incontinence systematically improved since he was started on Myrbetriq. He is very happy with his improvement as is his wife. He will continue this medication along with his Flomax.  1 BPH Continue Flomax   2.Overactive Bladder/Radiation cystitis We'll continue the patient Myrbetriq 50 mg daily. He was able to  get charity medication  from our office. He'll continue this medication. He was given 2 month supply during today's visit. He will call the office for his charity medication when he runs low on his current supply.  He will follow-up in 6 months to assess his progress.   Return in about 6 months (around 08/27/2016).  Nickie Retort, MD  [facility] [address], [place],  [address] [phone]

## 2016-04-23 ENCOUNTER — Telehealth: Payer: Self-pay | Admitting: Urology

## 2016-04-23 NOTE — Telephone Encounter (Signed)
Patient's wife called to let you know that he was down to his last three boxes of Myrbetriq. And to call so he doesn't run out.    [employer]

## 2016-04-26 NOTE — Telephone Encounter (Signed)
Spoke with patient wife and she states she has three weeks worth left. I let her know that we are out of the Myrbetriq 50 mg samples at this time and she could call back later in the week. She voiced understanding.

## 2016-05-13 DIAGNOSIS — I872 Venous insufficiency (chronic) (peripheral): Secondary | ICD-10-CM | POA: Insufficient documentation

## 2016-08-10 DIAGNOSIS — G8929 Other chronic pain: Secondary | ICD-10-CM | POA: Insufficient documentation

## 2016-08-10 DIAGNOSIS — M25571 Pain in right ankle and joints of right foot: Secondary | ICD-10-CM | POA: Insufficient documentation

## 2016-08-10 DIAGNOSIS — M25561 Pain in right knee: Secondary | ICD-10-CM

## 2016-08-27 ENCOUNTER — Ambulatory Visit (INDEPENDENT_AMBULATORY_CARE_PROVIDER_SITE_OTHER): Payer: Medicare Other | Admitting: Urology

## 2016-08-27 ENCOUNTER — Encounter: Payer: Self-pay | Admitting: Urology

## 2016-08-27 VITALS — BP 112/69 | HR 76 | Ht 68.75 in | Wt 198.0 lb

## 2016-08-27 DIAGNOSIS — N3281 Overactive bladder: Secondary | ICD-10-CM

## 2016-08-27 DIAGNOSIS — N400 Enlarged prostate without lower urinary tract symptoms: Secondary | ICD-10-CM | POA: Diagnosis not present

## 2016-08-27 LAB — BLADDER SCAN AMB NON-IMAGING: Scan Result: 80

## 2016-08-27 NOTE — Progress Notes (Signed)
08/27/2016 12:00 PM   Mike Holloway October 27, 1940 [id number]  Referring provider: [name], MD [place],  [id number]  Chief Complaint  Patient presents with  . Follow-up    BPH    HPI: The patient is a 75 year old gentleman who recently moved from [place], [place] with history of BPH and overactive bladder who is well controlled with tamsulosin and Myrbetriq 50 mg presents today for follow-up. 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 Since that time he has had issues with urinary urgency, urge incontinence, frequency, and weak stream. He had been on vesicare and ditropan in the past but it caused mental status changes.   He is on charity Myrbetriq as it was too expensive, and he had the aforementioned mental status changes with anticholinergices.  IPSS today 16/2. He is very pleased with his urinary symptoms. He has no more urgent incontinence that is significant which is very good for his quality of life. He has nocturia 0. He has mild incomplete emptying, frequency, intermittency, urgency, weak stream, and straining. Despite this he is very pleased. His PVR today is 80 cc.  PMH: Past Medical History:  Diagnosis Date  . Arthritis   . Atrial flutter ([place])   . Diabetes mellitus ([place])   . Essential tremor   . Hypercholesteremia   . Hypertension   . Incontinence   . 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[place])   . Sleep apnea   . Stroke ([place])   . 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 ABLATION    . APPENDECTOMY    . CATARACT EXTRACTION, BILATERAL    . DEEP BRAIN STIMULATOR PLACEMENT    . HEMORRHOID SURGERY    . HERNIA REPAIR    . NASAL SINUS SURGERY    . ORCHIECTOMY    . TONSILLECTOMY      Home Medications:    Medication List       Accurate as of 08/27/16  12:00 PM. Always use your most recent med list.          aspirin 81 MG tablet Take 81 mg by mouth daily.   diclofenac sodium 1 % Gel Commonly known as:  VOLTAREN Apply 2 g topically 4 (four) times daily.   famotidine 20 MG tablet Commonly known as:  PEPCID Take 20 mg by mouth 2 (two) times daily.   fluticasone 50 MCG/ACT nasal spray Commonly known as:  FLONASE Place 1 spray into both nostrils daily. Reported on 01/15/2016   LACTOBACILLUS PO Take by mouth.   Menthol (Topical Analgesic) 10 % Liqd   mirabegron ER 50 MG Tb24 tablet Commonly known as:  MYRBETRIQ Take 1 tablet (50 mg total) by mouth daily.   MIRALAX PO Take by mouth.   montelukast 10 MG tablet Commonly known as:  SINGULAIR Take 10 mg by mouth at bedtime.   NASACORT AQ NA Place into the nose.   primidone 50 MG tablet Commonly known as:  MYSOLINE Take 50 mg by mouth 4 (four) times daily.   propranolol ER 80 MG 24 hr capsule Commonly known as:  INDERAL LA Take 80 mg by mouth 2 (two) times daily.   RA IBUPROFEN 200 MG Caps Generic drug:  Ibuprofen Take by mouth.   tamsulosin 0.4 MG Caps capsule Commonly known as:  FLOMAX Take 0.4 mg  by mouth daily.   triamterene-hydrochlorothiazide 37.5-25 MG tablet Commonly known as:  MAXZIDE-25 Take 1 tablet by mouth daily.   VITAMIN D-1000 MAX ST 1000 units tablet Generic drug:  Cholecalciferol Take by mouth.       Allergies:  Allergies  Allergen Reactions  . Clindamycin Diarrhea    Contracted C. Diff x 2  . Ambien  [Zolpidem]     Other reaction(s): Other (See Comments) Disoriented and moody Other reaction(s): Other (See Comments) Disoriented and moody  . Penicillins   . Sulfa Antibiotics     Family History: Family History  Problem Relation Age of Onset  . Hematuria Father   . Prostate cancer Father   . Heart disease Mother   . Kidney disease Neg Hx   . Bladder Cancer Neg Hx     Social History:  reports that he has never smoked. He has  never used smokeless tobacco. He reports that he does not drink alcohol or use drugs.  ROS: UROLOGY Frequent Urination?: Yes Hard to postpone urination?: No Burning/pain with urination?: No Get up at night to urinate?: No Leakage of urine?: Yes Urine stream starts and stops?: Yes Trouble starting stream?: Yes Do you have to strain to urinate?: Yes Blood in urine?: No Urinary tract infection?: No Sexually transmitted disease?: No Injury to kidneys or bladder?: No Painful intercourse?: No Weak stream?: Yes Erection problems?: Yes Penile pain?: No  Gastrointestinal Nausea?: No Vomiting?: No Indigestion/heartburn?: No Diarrhea?: No Constipation?: No  Constitutional Fever: No Night sweats?: No Weight loss?: No Fatigue?: No  Skin Skin rash/lesions?: No Itching?: No  Eyes Blurred vision?: No Double vision?: No  Ears/Nose/Throat Sore throat?: No Sinus problems?: Yes  Hematologic/Lymphatic Swollen glands?: No Easy bruising?: No  Cardiovascular Leg swelling?: No Chest pain?: No  Respiratory Cough?: No Shortness of breath?: No  Endocrine Excessive thirst?: No  Musculoskeletal Back pain?: No Joint pain?: No  Neurological Headaches?: No Dizziness?: No  Psychologic Depression?: No Anxiety?: No  Physical Exam: BP 112/69   Pulse 76   Ht 5' 8.75" (1.746 m)   Wt 198 lb (89.8 kg)   BMI 29.45 kg/m   Constitutional:  Alert and oriented, No acute distress. HEENT: [place] AT, moist mucus membranes.  Trachea midline, no masses. Cardiovascular: No clubbing, cyanosis, or edema. Respiratory: Normal respiratory effort, no increased work of breathing. GI: Abdomen is soft, nontender, nondistended, no abdominal masses GU: No CVA tenderness.  Skin: No rashes, bruises or suspicious lesions. Lymph: No cervical or inguinal adenopathy. Neurologic: Grossly intact, no focal deficits, moving all 4 extremities. Psychiatric: Normal mood and affect.  Laboratory Data: No  results found for: WBC, HGB, HCT, MCV, PLT  No results found for: CREATININE  No results found for: PSA  No results found for: TESTOSTERONE  No results found for: HGBA1C  Urinalysis    Component Value Date/Time   APPEARANCEUR Clear 01/15/2016 1145   GLUCOSEU Negative 01/15/2016 1145   BILIRUBINUR Negative 01/15/2016 1145   PROTEINUR Negative 01/15/2016 1145   NITRITE Negative 01/15/2016 1145   LEUKOCYTESUR Negative 01/15/2016 1145     Assessment & Plan:    1 BPH Continue Flomax   2.Overactive Bladder/Radiation cystitis We'll continue the patient Myrbetriq 50 mg daily. He was able to get charity medication  from our office. He'll continue this medication.  He will call the office for his charity medication when he runs low on his current supply.  Return in about 1 year (around 08/27/2017).  Nickie Retort, MD  [facility] [address] [place], [place] [address] [phone]

## 2016-09-04 DIAGNOSIS — R251 Tremor, unspecified: Secondary | ICD-10-CM | POA: Insufficient documentation

## 2016-11-17 ENCOUNTER — Telehealth: Payer: Self-pay | Admitting: Urology

## 2016-11-17 NOTE — Telephone Encounter (Signed)
Pt called and would like some more samples of Myrbetriq 50 mg.  They can pick up one day next week.

## 2016-11-18 NOTE — Telephone Encounter (Signed)
Samples left up front.

## 2017-01-14 ENCOUNTER — Telehealth: Payer: Self-pay | Admitting: Urology

## 2017-01-14 NOTE — Telephone Encounter (Signed)
Pt's wife called back. Notified of samples left at front desk. Wife states she will pick up samples on 02/18/17.

## 2017-01-14 NOTE — Telephone Encounter (Signed)
No answer. Samples left up front.  

## 2017-01-14 NOTE — Telephone Encounter (Signed)
Pt asking for more Myrbetriq samples - green box, 50 mg. Please call pt.

## 2017-02-10 ENCOUNTER — Other Ambulatory Visit: Payer: Self-pay | Admitting: Unknown Physician Specialty

## 2017-02-10 DIAGNOSIS — M1711 Unilateral primary osteoarthritis, right knee: Secondary | ICD-10-CM

## 2017-02-24 ENCOUNTER — Ambulatory Visit
Admission: RE | Admit: 2017-02-24 | Discharge: 2017-02-24 | Disposition: A | Discharge status: Home / Self Care | Payer: Medicare Other | Source: Ambulatory Visit | Attending: Unknown Physician Specialty | Admitting: Unknown Physician Specialty

## 2017-02-24 DIAGNOSIS — M948X6 Other specified disorders of cartilage, lower leg: Secondary | ICD-10-CM | POA: Insufficient documentation

## 2017-02-24 DIAGNOSIS — M25561 Pain in right knee: Secondary | ICD-10-CM | POA: Diagnosis present

## 2017-02-24 DIAGNOSIS — M1711 Unilateral primary osteoarthritis, right knee: Secondary | ICD-10-CM

## 2017-02-24 MED ORDER — IOPAMIDOL (ISOVUE-200) INJECTION 41%
30.0000 mL | Freq: Once | INTRAVENOUS | Status: AC
  Administered 2017-02-24: 30 mL

## 2017-02-24 MED ORDER — LIDOCAINE HCL (PF) 1 % IJ SOLN
10.0000 mL | Freq: Once | INTRAMUSCULAR | Status: AC
  Administered 2017-02-24: 10 mL

## 2017-03-14 ENCOUNTER — Telehealth: Payer: Self-pay | Admitting: Urology

## 2017-03-14 NOTE — Telephone Encounter (Signed)
Gave patient 1 box of Myrbetriq 50mg   Lot [id number]  Exp 03-20  Called the patient's wife and let her know that they will be upfront to pick up. He has a follow up app with you on 09-01-17 but we have been giving him samples for almost a year now and need to know if there is anything else he can try other than this since his insurance does not cover the Myrbetriq and we can't keep supplying him with samples forever? They said if they need an appointment to come in and discuss they will make one or if you want to call something else in that's fine too.  Please advise  Sharyn Lull

## 2017-03-17 NOTE — Telephone Encounter (Signed)
Per dr. Pilar Jarvis ok to give patient more samples  Lot # [id number] Exp 02-20  Called patient to pick up  [facility]

## 2017-06-06 ENCOUNTER — Telehealth: Payer: Self-pay | Admitting: Urology

## 2017-06-06 NOTE — Telephone Encounter (Signed)
Pt wife called the office stating that Dr. Pilar Jarvis provides patient with Mybetriq samples and to call when pt is getting low.  Pt wife states that he only has 3 weeks of samples left and would like to come get more before he runs out.  Please leave message on machine. Please advise. Thanks.

## 2017-06-08 NOTE — Telephone Encounter (Signed)
Pt's wife stopped by office and wondered why no one called her back.  I spoke with Judson Roch and informed pt they had 3 boxes and we were out of samples at the current time.  I told pt she could check back in a few weeks OR get his prescription filled.

## 2017-06-28 ENCOUNTER — Ambulatory Visit
Admission: RE | Admit: 2017-06-28 | Discharge: 2017-06-28 | Disposition: A | Discharge status: Home / Self Care | Payer: Medicare Other | Source: Ambulatory Visit | Attending: Unknown Physician Specialty | Admitting: Unknown Physician Specialty

## 2017-06-28 ENCOUNTER — Other Ambulatory Visit: Payer: Self-pay | Admitting: Unknown Physician Specialty

## 2017-06-28 DIAGNOSIS — R600 Localized edema: Secondary | ICD-10-CM

## 2017-07-06 ENCOUNTER — Other Ambulatory Visit: Payer: Self-pay | Admitting: Orthopedic Surgery

## 2017-07-06 ENCOUNTER — Other Ambulatory Visit: Payer: Self-pay | Admitting: Unknown Physician Specialty

## 2017-07-06 ENCOUNTER — Ambulatory Visit
Admission: RE | Admit: 2017-07-06 | Discharge: 2017-07-06 | Disposition: A | Discharge status: Home / Self Care | Payer: Medicare Other | Source: Ambulatory Visit | Attending: Unknown Physician Specialty | Admitting: Unknown Physician Specialty

## 2017-07-06 DIAGNOSIS — Z96651 Presence of right artificial knee joint: Secondary | ICD-10-CM | POA: Diagnosis not present

## 2017-07-06 DIAGNOSIS — R600 Localized edema: Secondary | ICD-10-CM | POA: Insufficient documentation

## 2017-07-06 DIAGNOSIS — M79604 Pain in right leg: Secondary | ICD-10-CM

## 2017-07-06 DIAGNOSIS — M7121 Synovial cyst of popliteal space [Baker], right knee: Secondary | ICD-10-CM | POA: Insufficient documentation

## 2017-09-01 ENCOUNTER — Ambulatory Visit: Payer: Medicare Other

## 2017-09-02 ENCOUNTER — Ambulatory Visit: Payer: Medicare Other

## 2017-09-15 ENCOUNTER — Encounter: Payer: Self-pay | Admitting: Urology

## 2017-09-15 ENCOUNTER — Ambulatory Visit (INDEPENDENT_AMBULATORY_CARE_PROVIDER_SITE_OTHER): Payer: Medicare Other | Admitting: Urology

## 2017-09-15 ENCOUNTER — Encounter: Payer: Self-pay | Admitting: Gastroenterology

## 2017-09-15 ENCOUNTER — Encounter (INDEPENDENT_AMBULATORY_CARE_PROVIDER_SITE_OTHER): Payer: Self-pay

## 2017-09-15 ENCOUNTER — Ambulatory Visit (INDEPENDENT_AMBULATORY_CARE_PROVIDER_SITE_OTHER): Payer: Medicare Other | Admitting: Gastroenterology

## 2017-09-15 VITALS — BP 120/70 | HR 73 | Ht 68.0 in | Wt 202.5 lb

## 2017-09-15 VITALS — BP 119/74 | HR 71 | Ht 68.0 in | Wt 195.0 lb

## 2017-09-15 DIAGNOSIS — N3281 Overactive bladder: Secondary | ICD-10-CM

## 2017-09-15 DIAGNOSIS — A0472 Enterocolitis due to Clostridium difficile, not specified as recurrent: Secondary | ICD-10-CM | POA: Diagnosis not present

## 2017-09-15 DIAGNOSIS — N400 Enlarged prostate without lower urinary tract symptoms: Secondary | ICD-10-CM

## 2017-09-15 NOTE — Progress Notes (Signed)
Gastroenterology Consultation  Referring Provider:     [name], MD Primary Care Physician:  [name], MD Primary Gastroenterologist:  Dr. Allen Norris     Reason for Consultation:     Recurrent C. difficile        HPI:   Mike Holloway is a 76 y.o. y/o male referred for consultation & management of Recurrent C. difficile by Dr. Ellison Hughs, Chrissie Noa, MD.  This patient comes in today with his wife who reports that the patient has had recurrent C. Difficile infections in the past.  The patient recently had a bout of C. Difficile with foul-smelling stools and diarrhea.  The patient's wife states that he was started on antibiotics for this and has not had any further diarrhea abdominal pain or foul-smelling stool. There is no report of any abdominal pain nausea vomiting fevers or chills.  The patient has a history of constipation and has taken MiraLAX in the past.  The wife is concerned because the patient does have some incontinence of stool when he tries to get up from a sitting position sometimes.  The patient was recently on antibiotics before the most recent episode of C. Difficile occurred.  Past Medical History:  Diagnosis Date  . Arthritis   . Atrial flutter ([place])   . Diabetes mellitus ([place])   . Essential tremor   . Hypercholesteremia   . Hypertension   . Incontinence   . 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[place])   . Sleep apnea   . Stroke ([place])   . 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 ABLATION    . APPENDECTOMY    . CATARACT EXTRACTION, BILATERAL    . DEEP BRAIN STIMULATOR PLACEMENT    . HEMORRHOID SURGERY    . HERNIA REPAIR    . NASAL SINUS SURGERY    . ORCHIECTOMY    . TONSILLECTOMY      Prior to Admission medications   Medication Sig Start Date End Date Taking? Authorizing Provider  aspirin 81 MG tablet Take 81 mg by mouth daily.   Yes [provider]  Cholecalciferol (VITAMIN D-1000 MAX ST) 1000 UNITS tablet  Take by mouth.   Yes [provider]  diclofenac sodium (VOLTAREN) 1 % GEL Apply 2 g topically 4 (four) times daily.   Yes [provider]  famotidine (PEPCID) 20 MG tablet Take 20 mg by mouth 2 (two) times daily.   Yes [provider]  Ibuprofen (RA IBUPROFEN) 200 MG CAPS Take by mouth. 03/10/10  Yes [provider]  LACTOBACILLUS PO Take by mouth.   Yes [provider]  meloxicam (MOBIC) 15 MG tablet Take 15 mg by mouth daily.   Yes [provider]  Menthol, Topical Analgesic, 10 % LIQD    Yes [provider]  mirabegron ER (MYRBETRIQ) 50 MG TB24 tablet Take 1 tablet (50 mg total) by mouth daily. 12/11/15  Yes Nickie Retort, MD  montelukast (SINGULAIR) 10 MG tablet Take 10 mg by mouth at bedtime.   Yes [provider]  Polyethylene Glycol 3350 (MIRALAX PO) Take by mouth.   Yes [provider]  primidone (MYSOLINE) 50 MG tablet Take 50 mg by mouth 4 (four) times daily.    Yes [provider]  propranolol ER (INDERAL LA) 80 MG 24 hr capsule Take 80 mg by mouth 2 (two) times daily.   Yes [provider]  tamsulosin (FLOMAX) 0.4 MG  CAPS capsule Take 0.4 mg by mouth daily.   Yes [provider]  Triamcinolone Acetonide (NASACORT AQ NA) Place into the nose.   Yes [provider]  triamterene-hydrochlorothiazide (MAXZIDE-25) 37.5-25 MG tablet Take 1 tablet by mouth daily.   Yes [provider]  FLUoxetine (PROZAC) 10 MG tablet Take 10 mg by mouth daily.    [provider]    Family History  Problem Relation Age of Onset  . Hematuria Father   . Prostate cancer Father   . Heart disease Mother   . Kidney disease Neg Hx   . Bladder Cancer Neg Hx      Social History   Tobacco Use  . Smoking status: Never Smoker  . Smokeless tobacco: Never Used  Substance Use Topics  . Alcohol use: No    Alcohol/week: 0.0 oz  . Drug use: No    Allergies as of 09/15/2017  - Review Complete 09/15/2017  Allergen Reaction Noted  . Clindamycin Diarrhea 12/11/2015  . Ambien  [zolpidem]  06/10/2015  . Penicillins  02/12/2015  . Sulfa antibiotics  02/12/2015    Review of Systems:    All systems reviewed and negative except where noted in HPI.   Physical Exam:  BP 120/70   Pulse 73   Ht 5\' 8"  (1.727 m)   Wt 202 lb 8 oz (91.9 kg)   BMI 30.79 kg/m  No LMP for male patient. Psych:  Alert and cooperative. Normal mood and affect. General:   Alert,  Well-developed, well-nourished, pleasant and cooperative in NAD Head:  Normocephalic and atraumatic. Eyes:  Sclera clear, no icterus.   Conjunctiva pink. Ears:  Normal auditory acuity. Nose:  No deformity, discharge, or lesions. Mouth:  No deformity or lesions,oropharynx pink & moist. Neck:  Supple; no masses or thyromegaly. Lungs:  Respirations even and unlabored.  Clear throughout to auscultation.   No wheezes, crackles, or rhonchi. No acute distress. Heart:  Regular rate and rhythm; no murmurs, clicks, rubs, or gallops. Abdomen:  Normal bowel sounds.  No bruits.  Soft, non-tender and non-distended without masses, hepatosplenomegaly or hernias noted.  No guarding or rebound tenderness.  Negative Carnett sign.   Rectal:  Deferred.  Msk:  Symmetrical without gross deformities.  Good, equal movement & strength bilaterally. Pulses:  Normal pulses noted. Extremities:  No clubbing or edema.  No cyanosis. Neurologic:  Alert and oriented x3;  grossly normal neurologically. Skin:  Intact without significant lesions or rashes.  No jaundice. Lymph Nodes:  No significant cervical adenopathy. Psych:  Alert and cooperative. Normal mood and affect.  Imaging Studies: No results found.  Assessment and Plan:   Mike Holloway is a 76 y.o. y/o male with a recurrent history of C. Difficile.  The patient appears to be doing well right now.  There is no report of any diarrhea abdominal pain nausea vomiting fevers or chills.   The patient's wife also states that his stools are back to normal smelling.  The patient has been told that if the diarrhea should come back he should contact us as soon as possible and at that time he will be rapidly tested for C. Difficile and if positive will be set up for a stool transplant.  The patient has been explained that Dr. Vicente Males is presently doing stool transplants at [facility] with stools from a [place] that screens the samples.  The patient and his wife states they understand the plan and agree with it and will  contact us if the diarrhea returns.  Lucilla Lame, MD. [facility]   Note: This dictation was prepared with Dragon dictation along with smaller phrase technology. Any transcriptional errors that result from this process are unintentional.

## 2017-09-15 NOTE — Progress Notes (Signed)
09/15/2017 4:08 PM   Mike Holloway 1941-07-18 [id number]  Referring provider: [name], MD [place], [place] [address]  Chief Complaint  Patient presents with  . Benign Prostatic Hypertrophy    1year follow up    HPI: The patient is a 76 year old gentleman who is originally from [place], [place] with history of BPH and overactive bladder who is well controlled with tamsulosin and Myrbetriq 50 mg presents today for annual follow-up. 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 Since that time he has had issues with urinary urgency, urge incontinence, frequency, and weak stream. He had been on vesicare and ditropan in the past but it caused mental status changes.   He is on charity Myrbetriq as it was too expensive, and he had the aforementioned mental status changes with anticholinergices.  He presents today for annual follow-up with no major changes to his urinary status.  He has only very occasional urinary urgency with urge incontinence.  He is able to travel without difficulty which was a primary consider when he first saw [place].  He is voiding with a good stream.  He feels he empties his bladder.  Overall, he is extremely happy with the Myrbetriq medication.  He has no complaints at this time.   PMH: Past Medical History:  Diagnosis Date  . Arthritis   . Atrial flutter ([place])   . Diabetes mellitus ([place])   . Essential tremor   . Hypercholesteremia   . Hypertension   . Incontinence   . 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 Sleep apnea   . Stroke ([place])   . 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 ABLATION    . APPENDECTOMY    . CATARACT EXTRACTION, BILATERAL    . DEEP BRAIN STIMULATOR PLACEMENT    . HEMORRHOID SURGERY    . HERNIA REPAIR    . NASAL SINUS SURGERY    . ORCHIECTOMY    . TONSILLECTOMY       Home Medications:  Allergies as of 09/15/2017      Reactions   Clindamycin Diarrhea   Contracted C. Diff x 2   Ambien  [zolpidem]    Other reaction(s): Other (See Comments) Disoriented and moody Other reaction(s): Other (See Comments) Disoriented and moody   Penicillins    Sulfa Antibiotics       Medication List        Accurate as of 09/15/17  4:08 PM. Always use your most recent med list.          aspirin 81 MG tablet Take 81 mg by mouth daily.   diclofenac sodium 1 % Gel Commonly known as:  VOLTAREN Apply 2 g topically 4 (four) times daily.   famotidine 20 MG tablet Commonly known as:  PEPCID Take 20 mg by mouth 2 (two) times daily.   FLUoxetine 10 MG tablet Commonly known as:  PROZAC Take 10 mg by mouth daily.   LACTOBACILLUS PO Take by mouth.   meloxicam 15 MG tablet Commonly known as:  MOBIC Take 15 mg by mouth daily.   Menthol (Topical Analgesic) 10 % Liqd   mirabegron ER 50 MG Tb24 tablet Commonly known as:  MYRBETRIQ Take 1 tablet (50 mg total) by mouth daily.   MIRALAX PO Take by mouth.   montelukast 10 MG tablet Commonly known as:  SINGULAIR Take 10 mg by mouth at bedtime.   NASACORT AQ NA Place into the nose.   primidone 50 MG tablet Commonly known as:  MYSOLINE Take 50 mg by mouth 4 (four) times daily.   propranolol ER 80 MG 24 hr capsule Commonly known as:  INDERAL LA Take 80 mg by mouth 2 (two) times daily.   RA IBUPROFEN 200 MG Caps Generic drug:  Ibuprofen Take by mouth.   tamsulosin 0.4 MG Caps capsule Commonly known as:  FLOMAX Take 0.4 mg by mouth daily.   triamterene-hydrochlorothiazide 37.5-25 MG tablet Commonly known as:  MAXZIDE-25 Take 1 tablet by mouth daily.   VITAMIN D-1000 MAX ST 1000 units tablet Generic drug:  Cholecalciferol Take by mouth.       Allergies:  Allergies  Allergen Reactions  . Clindamycin Diarrhea    Contracted C. Diff x 2  . Ambien  [Zolpidem]     Other reaction(s): Other  (See Comments) Disoriented and moody Other reaction(s): Other (See Comments) Disoriented and moody  . Penicillins   . Sulfa Antibiotics     Family History: Family History  Problem Relation Age of Onset  . Hematuria Father   . Prostate cancer Father   . Heart disease Mother   . Kidney disease Neg Hx   . Bladder Cancer Neg Hx     Social History:  reports that  has never smoked. he has never used smokeless tobacco. He reports that he does not drink alcohol or use drugs.  ROS: UROLOGY Frequent Urination?: Yes Hard to postpone urination?: Yes Burning/pain with urination?: No Get up at night to urinate?: No Leakage of urine?: Yes Urine stream starts and stops?: Yes Trouble starting stream?: Yes Do you have to strain to urinate?: No Blood in urine?: No Urinary tract infection?: No Sexually transmitted disease?: No Injury to kidneys or bladder?: No Painful intercourse?: No Weak stream?: Yes Erection problems?: Yes Penile pain?: No  Gastrointestinal Nausea?: No Vomiting?: No Indigestion/heartburn?: Yes Diarrhea?: No Constipation?: No  Constitutional Fever: No Night sweats?: No Weight loss?: No Fatigue?: Yes  Skin Skin rash/lesions?: No Itching?: No  Eyes Blurred vision?: No Double vision?: No  Ears/Nose/Throat Sore throat?: No Sinus problems?: Yes  Hematologic/Lymphatic Swollen glands?: No Easy bruising?: No  Cardiovascular Leg swelling?: No Chest pain?: No  Respiratory Cough?: No Shortness of breath?: No  Endocrine Excessive thirst?: No  Musculoskeletal Back pain?: No Joint pain?: No  Neurological Headaches?: No Dizziness?: No  Psychologic Depression?: No Anxiety?: No  Physical Exam: BP 119/74   Pulse 71   Ht 5\' 8"  (1.727 m)   Wt 195 lb (88.5 kg)   BMI 29.65 kg/m   Constitutional:  Alert and oriented, No acute distress. HEENT: [place] AT, moist mucus membranes.  Trachea midline, no masses. Cardiovascular: No clubbing, cyanosis, or  edema. Respiratory: Normal respiratory effort, no increased work of breathing. GI: Abdomen is soft, nontender, nondistended, no abdominal masses GU: No CVA tenderness.  Skin: No rashes, bruises or suspicious lesions. Lymph: No cervical or inguinal adenopathy. Neurologic: Grossly intact, no focal deficits, moving all 4 extremities. Psychiatric: Normal mood and affect.  Laboratory Data: No results found for: WBC, HGB, HCT, MCV, PLT  No results found for: CREATININE  No results found for: PSA  No results found for: TESTOSTERONE  No results found for: HGBA1C  Urinalysis    Component Value Date/Time   APPEARANCEUR Clear 01/15/2016 1145   GLUCOSEU Negative 01/15/2016 1145   BILIRUBINUR Negative 01/15/2016 1145   PROTEINUR  Negative 01/15/2016 1145   NITRITE Negative 01/15/2016 1145   LEUKOCYTESUR Negative 01/15/2016 1145    Assessment & Plan:    1 BPH Continue Flomax   2.Overactive Bladder/Radiation cystitis We'll continue the patient Myrbetriq 50 mg daily. He was able to get charity medicationfrom our office with the help of the drug reps. He'll continue this medication.  He will call the office for his charity medication when he runs low on his current supply.  Return in about 1 year (around 09/15/2018).  Nickie Retort, MD  [facility] [address] [place], [place] [address] [phone]

## 2017-09-29 ENCOUNTER — Telehealth: Payer: Self-pay | Admitting: Gastroenterology

## 2017-09-29 ENCOUNTER — Other Ambulatory Visit: Payer: Self-pay

## 2017-09-29 DIAGNOSIS — K5900 Constipation, unspecified: Secondary | ICD-10-CM

## 2017-09-29 NOTE — Telephone Encounter (Signed)
Spoke with pt's wife regarding the constipation. Pt has been taking Citracal as directed. Spoke with Dr. Allen Norris and he has okayed a KUB and to restart Miralax.

## 2017-09-29 NOTE — Telephone Encounter (Signed)
Patients wife [name] called and patient is still constipated. He has been using citracel. Feels like something is wrong like a blockage. Patient has had cancer on lower abdomen and testicles years ago. Please call.

## 2017-09-30 ENCOUNTER — Ambulatory Visit
Admission: RE | Admit: 2017-09-30 | Discharge: 2017-09-30 | Disposition: A | Discharge status: Home / Self Care | Payer: Medicare Other | Source: Ambulatory Visit | Attending: Gastroenterology | Admitting: Gastroenterology

## 2017-09-30 DIAGNOSIS — K5900 Constipation, unspecified: Secondary | ICD-10-CM | POA: Insufficient documentation

## 2017-09-30 DIAGNOSIS — K3189 Other diseases of stomach and duodenum: Secondary | ICD-10-CM | POA: Diagnosis not present

## 2017-10-03 ENCOUNTER — Telehealth: Payer: Self-pay

## 2017-10-03 NOTE — Telephone Encounter (Signed)
Pt has been advised of KUB results. He has been advised to restart Miralax and take along with the fiber. If symptoms persist, then pt's wife is to contact me on Monday.

## 2017-10-03 NOTE — Telephone Encounter (Signed)
-----   Message from Lucilla Lame, MD sent at 09/30/2017  2:17 PM EST ----- Let the patient know that there is a moderate amount of stool in his colon. The patient should start back on his MiraLAX until his colon is cleared out. He should also continue the fiber.

## 2017-11-03 DIAGNOSIS — Z96651 Presence of right artificial knee joint: Secondary | ICD-10-CM | POA: Insufficient documentation

## 2017-11-30 ENCOUNTER — Other Ambulatory Visit
Admission: RE | Admit: 2017-11-30 | Discharge: 2017-11-30 | Disposition: A | Discharge status: Home / Self Care | Payer: Medicare Other | Source: Ambulatory Visit | Attending: Gastroenterology | Admitting: Gastroenterology

## 2017-11-30 ENCOUNTER — Other Ambulatory Visit: Payer: Self-pay

## 2017-11-30 DIAGNOSIS — R197 Diarrhea, unspecified: Secondary | ICD-10-CM

## 2017-11-30 LAB — C DIFFICILE QUICK SCREEN W PCR REFLEX
C Diff antigen: POSITIVE — AB
C Diff toxin: NEGATIVE

## 2017-11-30 LAB — CLOSTRIDIUM DIFFICILE BY PCR, REFLEXED: Toxigenic C. Difficile by PCR: POSITIVE — AB

## 2017-12-01 ENCOUNTER — Telehealth: Payer: Self-pay | Admitting: Gastroenterology

## 2017-12-02 ENCOUNTER — Other Ambulatory Visit: Payer: Self-pay

## 2017-12-02 MED ORDER — VANCOMYCIN HCL 125 MG PO CAPS: 125.0000 mg | ORAL_CAPSULE | Freq: Four times a day (QID) | ORAL | 0 refills | Status: DC

## 2017-12-02 NOTE — Telephone Encounter (Signed)
Pt was constipated before, but since Tuesday he has been experiencing very loose stools. They have also noticed a different odor. Per Dr. Allen Norris, pt is to be tried on Dificid due to previous infection. Pt's wife has been notified. Request for this medication has been sent to [facility].

## 2017-12-02 NOTE — Progress Notes (Signed)
van 

## 2017-12-02 NOTE — Telephone Encounter (Signed)
-----   Message from Lucilla Lame, MD sent at 12/01/2017 11:26 AM EST ----- The last time the patient contacted our office he was having constipation and a KUB showing large amount of stool. When that he started having diarrhea and was she on any antibiotics. Was she responsive to treatment with Flagyl during his last infection with C. difficile? If yes then restart him on Flagyl.

## 2017-12-16 ENCOUNTER — Other Ambulatory Visit: Payer: Self-pay

## 2017-12-16 MED ORDER — VANCOMYCIN HCL 125 MG PO CAPS: ORAL_CAPSULE | ORAL | 0 refills | Status: DC

## 2017-12-19 ENCOUNTER — Telehealth: Payer: Self-pay | Admitting: Urology

## 2017-12-19 NOTE — Telephone Encounter (Signed)
Pt was advised by Dr. Pilar Jarvis to call office to let nurse staff know when pt is low on his medication/Myrbetriq. Pt wife states pt only has 4 boxes.  Pt needs more.  Please advise. Thank.s

## 2017-12-20 NOTE — Telephone Encounter (Signed)
Medication is available for patient to pick up. Patient notified.

## 2017-12-28 DIAGNOSIS — R001 Bradycardia, unspecified: Secondary | ICD-10-CM | POA: Insufficient documentation

## 2017-12-29 ENCOUNTER — Other Ambulatory Visit: Payer: Self-pay

## 2017-12-29 MED ORDER — VANCOMYCIN HCL 125 MG PO CAPS: 125.0000 mg | ORAL_CAPSULE | ORAL | 0 refills | Status: AC

## 2018-02-14 ENCOUNTER — Ambulatory Visit: Payer: Medicare Other | Attending: Neurosurgery | Admitting: Physical Therapy

## 2018-02-14 ENCOUNTER — Encounter: Payer: Self-pay | Admitting: Physical Therapy

## 2018-02-14 DIAGNOSIS — R293 Abnormal posture: Secondary | ICD-10-CM | POA: Diagnosis present

## 2018-02-14 DIAGNOSIS — M6281 Muscle weakness (generalized): Secondary | ICD-10-CM | POA: Diagnosis not present

## 2018-02-14 DIAGNOSIS — R269 Unspecified abnormalities of gait and mobility: Secondary | ICD-10-CM | POA: Diagnosis present

## 2018-02-14 DIAGNOSIS — S8001XA Contusion of right knee, initial encounter: Secondary | ICD-10-CM | POA: Insufficient documentation

## 2018-02-16 ENCOUNTER — Ambulatory Visit: Payer: Medicare Other | Admitting: Physical Therapy

## 2018-02-16 DIAGNOSIS — R293 Abnormal posture: Secondary | ICD-10-CM

## 2018-02-16 DIAGNOSIS — R269 Unspecified abnormalities of gait and mobility: Secondary | ICD-10-CM

## 2018-02-16 DIAGNOSIS — M6281 Muscle weakness (generalized): Secondary | ICD-10-CM

## 2018-02-18 ENCOUNTER — Other Ambulatory Visit: Payer: Self-pay

## 2018-02-18 NOTE — Therapy (Addendum)
[facility] [address] [place], [place], [address] Phone: [phone]   Fax:  [phone]  Physical Therapy Treatment  Patient Details  Name: Mike Holloway MRN: [id number] Date of Birth: 07/23/1941 Referring Provider: Dr. Manson Passey   Encounter Date: 02/16/2018   Treatment: 2 of 8.  Recert date: 0/34/74   Past Medical History:  Diagnosis Date  . Arthritis   . Atrial flutter ([place])   . Diabetes mellitus ([place])   . Essential tremor   . Hypercholesteremia   . Hypertension   . Incontinence   . 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 Sleep apnea   . Stroke [facility])     Past Surgical History:  Procedure Laterality Date  . ABLATION    . APPENDECTOMY    . CATARACT EXTRACTION, BILATERAL    . COLONOSCOPY WITH PROPOFOL N/A 03/17/2018   Procedure: COLONOSCOPY WITH PROPOFOL;  Surgeon: Jonathon Bellows, MD;  Location: [facility] ENDOSCOPY;  Service: Gastroenterology;  Laterality: N/A;  . DEEP BRAIN STIMULATOR PLACEMENT    . 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 HEMORRHOID SURGERY    . HERNIA REPAIR    . NASAL SINUS SURGERY    . ORCHIECTOMY    . TONSILLECTOMY      There were no vitals filed for this visit.      Pt. reports no new complaints.       Treatment:   There.ex.: Scifit L6-7 10 min. B UE/LE.   Reviewed HEP (hip flexion/ ext./ abd./ knee flexion), SLR/ glut. Sets/ quad set.  Neuro. Mm.: Airex ex. (EC/ NBOS/ tandem) Resisted gait 2BTB 5x all 4-planes. Hallway tasks (turning/ high marching/ SPC).         PT. is progressing well to use of SPC on level surfaces with 2-point gait pattern. Pt. turned DBS off during tx. session in order to communicate better. Pt. demonstrates good technique with HEP.      PT Long Term Goals - 02/26/18 1017      PT LONG TERM GOAL #1    Title  Pt. independent with HEP to increase B hip flexion/ abduction strength 1/2 muscle grade to improve pain-free mobility.      Baseline  B quad/ hamtring strength grossly 5/5 MMT and B hip flexion/ abd. 4/5 MMT.    Time  4    Period  Weeks    Status  New    Target Date  03/14/18      PT LONG TERM GOAL #2   Title  Pt. will increase Berg balance test to >45 out of 56 to improve independence with gait/ decrease fall risk.      Baseline  Berg: 37/56    Time  4    Period  Weeks    Status  New    Target Date  03/14/18      PT LONG TERM GOAL #3   Title  Pt. able to ambulate 100 feet with consistent 2 point gait pattern and use of SPC to improve household mobility.     Baseline  Pt. currently using RW    Time  4    Period  Weeks    Status  New    Target Date  03/14/18      PT LONG TERM GOAL #4   Title  Pt. able to stand from  normal chair with no UE assist to improve safety/independence with transfers.     Baseline  extra time and UE assist required to stand    Time  4    Period  Weeks    Status  New    Target Date  03/14/18          Patient will benefit from skilled therapeutic intervention in order to improve the following deficits and impairments:  Abnormal gait, Decreased balance, Decreased endurance, Decreased mobility, Difficulty walking, Decreased range of motion, Decreased activity tolerance, Decreased strength, Impaired flexibility, Postural dysfunction, Pain  Visit Diagnosis: Muscle weakness (generalized)  Gait difficulty  Abnormal posture     Problem List Patient Active Problem List   Diagnosis Date Noted  . 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 HLD (hyperlipidemia) 03/24/2018  . Contusion of right knee 02/14/2018  . Bradycardia 12/28/2017  . 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 Chronic pain of right knee 08/10/2016  . Right ankle pain 08/10/2016  . Venous insufficiency of both lower extremities 05/13/2016   . 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[place]) 12/11/2015  . 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 Lymphoma, 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[place]) 04/03/2015  . Breathlessness on exertion 11/21/2014  . Breath shortness 11/21/2014  . Arthropathy 11/07/2014  . Atrial flutter, paroxysmal ([place]) 11/07/2014  . Type 2 diabetes mellitus ([place]) 11/07/2014  . Benign essential tremor 11/07/2014  . Benign essential HTN 11/07/2014  . Mixed incontinence 11/07/2014  . Hypercholesterolemia without hypertriglyceridemia 11/07/2014  . Apnea, sleep 11/07/2014  . Controlled type 2 diabetes mellitus without complication ([place]) 93/79/0240  . Pure hypercholesterolemia 11/07/2014  . Other abnormalities of gait and mobility 11/02/2011  . Abnormal gait 06/29/2011  . Discoordination 06/29/2011   Pura Spice, PT, DPT # [phone] 02/21/2018, 8:06 AM  [facility] [address] [place], [place], [address] Phone: [phone]   Fax:  [phone]  Name: Mike Holloway MRN: [id number] Date of Birth: 22-Feb-1941

## 2018-02-18 NOTE — Therapy (Addendum)
[facility] [address] [place], [place], [address] Phone: [phone]   Fax:  [phone]  Physical Therapy Evaluation  Patient Details  Name: Mike Holloway MRN: [id number] Date of Birth: 1941/09/21 Referring Provider: Dr. Manson Passey   Encounter Date: 02/14/2018    PT End of Session - 02/18/18 1543    Visit Number  1    Number of Visits  8    Date for PT Re-Evaluation  03/14/18    PT Start Time  2683    PT Stop Time  1517    PT Time Calculation (min)  55 min    Activity Tolerance  Patient tolerated treatment well    Behavior During Therapy  [facility] for tasks assessed/performed         Past Medical History:  Diagnosis Date  . Arthritis   . Atrial flutter ([place])   . Diabetes mellitus ([place])   . Essential tremor   . Hypercholesteremia   . Hypertension   . Incontinence   . 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[place])   . Sleep apnea   . Stroke ([place])   . Testicular cancer [facility])     Past Surgical History:  Procedure Laterality Date  . ABLATION    . APPENDECTOMY    . CATARACT EXTRACTION, BILATERAL    . DEEP BRAIN STIMULATOR PLACEMENT    . HEMORRHOID SURGERY    . HERNIA REPAIR    . NASAL SINUS SURGERY    . ORCHIECTOMY    . TONSILLECTOMY      There were no vitals filed for this visit.   S/p DBS placement on 01/10/18. Pt. returns this week for reassessment of DBS. Pt. using RW at this time and hoping to return to using cane. No recent falls.        Tandem stance/ gait.   Turning with EO/EC.  Functional reaching    PT Education - 02/26/18 0943    Education Details  Discussed standing hip/LE ex.      Person(s) Educated  Patient;Spouse    Methods  Explanation;Demonstration    Comprehension  Verbalized understanding;Returned demonstration          PT Long Term Goals - 02/26/18 1017      PT LONG TERM GOAL #1   Title  Pt. independent with HEP to increase B hip flexion/ abduction strength 1/2 muscle grade to improve  pain-free mobility.      Baseline  B quad/ hamtring strength grossly 5/5 MMT and B hip flexion/ abd. 4/5 MMT.    Time  4    Period  Weeks    Status  New    Target Date  03/14/18      PT LONG TERM GOAL #2   Title  Pt. will increase Berg balance test to >45 out of 56 to improve independence with gait/ decrease fall risk.      Baseline  Berg: 37/56    Time  4    Period  Weeks    Status  New    Target Date  03/14/18      PT LONG TERM GOAL #3   Title  Pt. able to ambulate 100 feet with consistent 2 point gait pattern and use of SPC to improve household mobility.     Baseline  Pt. currently using RW    Time  4    Period  Weeks    Status  New    Target Date  03/14/18  PT LONG TERM GOAL #4   Title  Pt. able to stand from normal chair with no UE assist to improve safety/independence with transfers.     Baseline  extra time and UE assist required to stand    Time  4    Period  Weeks    Status  New    Target Date  03/14/18        Pt. 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 Pt. reports no pain and had to turn off stimulator to assist with communication. Pt. currently ambulates with use of RW in home/ clinic for balance/ safety. Pt. hoping to return to using [facility] with home/ community ambulation. B UE AROM WFL and strength grossly 5/5 MMT. R knee discomfort (previous partial TKA) and tenderness with palpation. B quad/ hamtring strength grossly 5/5 MMT and B hip flexion/ abd. 4/5 MMT. Berg balance test: 37/56 (high fall risk). Pt. will benefit from skilled PT services to increase B LE muscle strength to improve independence with gait/ balance.      Patient will benefit from skilled therapeutic intervention in order to improve the following deficits and impairments:  Abnormal gait, Decreased balance, Decreased endurance, Decreased mobility, Difficulty walking, Decreased range of motion, Decreased activity tolerance, Decreased strength, Impaired  flexibility, Postural dysfunction, Pain  Visit Diagnosis: Muscle weakness (generalized)  Gait difficulty  Abnormal posture     Problem List Patient Active Problem List   Diagnosis Date Noted  . Chronic pain of right knee 08/10/2016  . Right ankle pain 08/10/2016  . Venous insufficiency of both lower extremities 05/13/2016  . 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[place]) 12/11/2015  . 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 Lymphoma, 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[place]) 04/03/2015  . Breathlessness on exertion 11/21/2014  . Breath shortness 11/21/2014  . Arthropathy 11/07/2014  . Atrial flutter ([place]) 11/07/2014  . Type 2 diabetes mellitus ([place]) 11/07/2014  . Benign essential tremor 11/07/2014  . Benign essential HTN 11/07/2014  . Mixed incontinence 11/07/2014  . Hypercholesterolemia without hypertriglyceridemia 11/07/2014  . Apnea, sleep 11/07/2014  . Controlled type 2 diabetes mellitus without complication ([place]) 21/30/8657  . Essential (primary) hypertension 11/07/2014  . Pure hypercholesterolemia 11/07/2014  . Other abnormalities of gait and mobility 11/02/2011  . Abnormal gait 06/29/2011  . Discoordination 06/29/2011   Pura Spice, PT, DPT # [id number] 02/26/2018, 10:24 AM  [facility] [address] [place], [place], [address] Phone: [phone]   Fax:  [phone]  Name: Mike Holloway MRN: [id number] Date of Birth: April 12, 1941

## 2018-02-22 ENCOUNTER — Ambulatory Visit: Payer: Medicare Other | Admitting: Physical Therapy

## 2018-02-22 DIAGNOSIS — M6281 Muscle weakness (generalized): Secondary | ICD-10-CM

## 2018-02-22 DIAGNOSIS — R293 Abnormal posture: Secondary | ICD-10-CM

## 2018-02-22 DIAGNOSIS — R269 Unspecified abnormalities of gait and mobility: Secondary | ICD-10-CM

## 2018-02-24 ENCOUNTER — Ambulatory Visit: Payer: Medicare Other | Admitting: Physical Therapy

## 2018-02-24 DIAGNOSIS — M6281 Muscle weakness (generalized): Secondary | ICD-10-CM

## 2018-02-24 DIAGNOSIS — R269 Unspecified abnormalities of gait and mobility: Secondary | ICD-10-CM

## 2018-02-24 DIAGNOSIS — R293 Abnormal posture: Secondary | ICD-10-CM

## 2018-02-24 NOTE — Therapy (Addendum)
[facility] [address] [place], [place], [address] Phone: [phone]   Fax:  [phone]  Physical Therapy Treatment  Patient Details  Name: Mike Holloway MRN: [id number] Date of Birth: 18-Nov-1940 Referring Provider: Dr. Manson Passey   Encounter Date: 02/24/2018   Treatment 4 of 8.  Recert date: 1/70/01  Past Medical History:  Diagnosis Date  . Arthritis   . Atrial flutter ([place])   . Diabetes mellitus ([place])   . Essential tremor   . Hypercholesteremia   . Hypertension   . Incontinence   . 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 Sleep apnea   . Stroke [facility])     Past Surgical History:  Procedure Laterality Date  . ABLATION    . APPENDECTOMY    . CATARACT EXTRACTION, BILATERAL    . COLONOSCOPY WITH PROPOFOL N/A 03/17/2018   Procedure: COLONOSCOPY WITH PROPOFOL;  Surgeon: Jonathon Bellows, MD;  Location: [facility] ENDOSCOPY;  Service: Gastroenterology;  Laterality: N/A;  . DEEP BRAIN STIMULATOR PLACEMENT    . 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   Procedure: FECAL TRANSPLANT;  Surgeon: Jonathon Bellows, MD;  Location: [facility] ENDOSCOPY;  Service: Gastroenterology;  Laterality: N/A;  . HEMORRHOID SURGERY    . HERNIA REPAIR    . NASAL SINUS SURGERY    . ORCHIECTOMY    . TONSILLECTOMY      There were no vitals filed for this visit.      Pt. reports toes still hurt today. Pt. entered PT with use of SPC and wife. No issues since last PT tx. session       Treatment:  Gait training: Ambulate in PT clinic with focus on consistent step pattern/ BOS/ heel strike with no assistive device.  Ascend/ descend stairs with recip. Pattern and cuing to BOS/ posture.  There.ex.: Standing hip ex. (all planes)- 2.5# Step ups/downs with CGA for assist 10x2 (//-bars/ mirror feedback). Seated hip flexion/ knee flexion with RTB 20x./ alt. UE and LE  Neuro: STS from varying surfaces (19.5") Obstacle course/ mini-squats/ step overs/  outside walking        Pt. able to stand from firm surface that is 19.5" from ground with no UE assist. Pt. has difficulty standing from soft seated surfaces and requires use of UE for safety/ proper technique. Pt. progressing well with difficulty of obstacle courses/ step patterns in PT clinic.        PT Long Term Goals - 02/26/18 1017      PT LONG TERM GOAL #1   Title  Pt. independent with HEP to increase B hip flexion/ abduction strength 1/2 muscle grade to improve pain-free mobility.      Baseline  B quad/ hamtring strength grossly 5/5 MMT and B hip flexion/ abd. 4/5 MMT.    Time  4    Period  Weeks    Status  New    Target Date  03/14/18      PT LONG TERM GOAL #2   Title  Pt. will increase Berg balance test to >45 out of 56 to improve independence with gait/ decrease fall risk.      Baseline  Berg: 37/56    Time  4    Period  Weeks    Status  New    Target Date  03/14/18      PT LONG TERM GOAL #3   Title  Pt. able to ambulate 100 feet with consistent 2 point gait pattern  and use of SPC to improve household mobility.     Baseline  Pt. currently using RW    Time  4    Period  Weeks    Status  New    Target Date  03/14/18      PT LONG TERM GOAL #4   Title  Pt. able to stand from normal chair with no UE assist to improve safety/independence with transfers.     Baseline  extra time and UE assist required to stand    Time  4    Period  Weeks    Status  New    Target Date  03/14/18              Patient will benefit from skilled therapeutic intervention in order to improve the following deficits and impairments:  Abnormal gait, Decreased balance, Decreased endurance, Decreased mobility, Difficulty walking, Decreased range of motion, Decreased activity tolerance, Decreased strength, Impaired flexibility, Postural dysfunction, Pain  Visit Diagnosis: Muscle weakness (generalized)  Gait difficulty  Abnormal posture     Problem List Patient Active Problem  List   Diagnosis Date Noted  . 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 HLD (hyperlipidemia) 03/24/2018  . Contusion of right knee 02/14/2018  . Bradycardia 12/28/2017  . 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 Chronic pain of right knee 08/10/2016  . Right ankle pain 08/10/2016  . Venous insufficiency of both lower extremities 05/13/2016  . 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[place]) 12/11/2015  . 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 Lymphoma, 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[place]) 04/03/2015  . Breathlessness on exertion 11/21/2014  . Breath shortness 11/21/2014  . Arthropathy 11/07/2014  . Atrial flutter, paroxysmal ([place]) 11/07/2014  . Type 2 diabetes mellitus ([place]) 11/07/2014  . Benign essential tremor 11/07/2014  . Benign essential HTN 11/07/2014  . Mixed incontinence 11/07/2014  . Hypercholesterolemia without hypertriglyceridemia 11/07/2014  . Apnea, sleep 11/07/2014  . Controlled type 2 diabetes mellitus without complication ([place]) 70/26/3785  . Pure hypercholesterolemia 11/07/2014  . Other abnormalities of gait and mobility 11/02/2011  . Abnormal gait 06/29/2011  . Discoordination 06/29/2011   Pura Spice, PT, DPT # [phone] 02/28/2018, 10:25 AM  [facility] [address] [place], [place], [address] Phone: [phone]   Fax:  [phone]  Name: Mike Holloway MRN: [id number] Date of Birth: [birth year]

## 2018-02-24 NOTE — Therapy (Addendum)
[facility] [address] [place], [place], [address] Phone: [phone]   Fax:  [phone]  Physical Therapy Treatment  Patient Details  Name: Mike Holloway MRN: [id number] Date of Birth: 12-Jan-1941 Referring Provider: Dr. Manson Passey   Encounter Date: 02/22/2018   Treatment 3 of 8.  Recert: 03/28/62     Past Medical History:  Diagnosis Date  . Arthritis   . Atrial flutter ([place])   . Diabetes mellitus ([place])   . Essential tremor   . Hypercholesteremia   . Hypertension   . Incontinence   . 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[place])    grew on the testical  . Sleep apnea   . Stroke [facility])     Past Surgical History:  Procedure Laterality Date  . ABLATION    . APPENDECTOMY    . CATARACT EXTRACTION, BILATERAL    . COLONOSCOPY WITH PROPOFOL N/A 03/17/2018   Procedure: COLONOSCOPY WITH PROPOFOL;  Surgeon: Jonathon Bellows, MD;  Location: [facility] ENDOSCOPY;  Service: Gastroenterology;  Laterality: N/A;  . DEEP BRAIN STIMULATOR PLACEMENT    . 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   Procedure: FECAL TRANSPLANT;  Surgeon: Jonathon Bellows, MD;  Location: [facility] ENDOSCOPY;  Service: Gastroenterology;  Laterality: N/A;  . HEMORRHOID SURGERY    . HERNIA REPAIR    . NASAL SINUS SURGERY    . ORCHIECTOMY    . TONSILLECTOMY      There were no vitals filed for this visit.      Pt. had toe nails removed and reports soreness/ discomfort in shoe.        Treatment:   There.ex.: Scifit L6 10 min. (warm-up/no charge) STS in //-bars (added blue Airex) 6" step ups/ downs with min. UE assist  Neuro: Obstacle course in hallway (1 LOB with self-correction) Cone touches/ plinth step overs Star touches/ clock cuing Blue Airex step ups/ static stance Resisted gait 2BTB 5x all 4-planes.         Pt. had 1 LOB during obstacle course tasks and was able to self-correct with light UE assist. Difficulty and cuing required during star touches. No change to current  HEP and pt. instructed to continue with daily walking program.      PT Long Term Goals - 02/26/18 1017      PT LONG TERM GOAL #1   Title  Pt. independent with HEP to increase B hip flexion/ abduction strength 1/2 muscle grade to improve pain-free mobility.      Baseline  B quad/ hamtring strength grossly 5/5 MMT and B hip flexion/ abd. 4/5 MMT.    Time  4    Period  Weeks    Status  New    Target Date  03/14/18      PT LONG TERM GOAL #2   Title  Pt. will increase Berg balance test to >45 out of 56 to improve independence with gait/ decrease fall risk.      Baseline  Berg: 37/56    Time  4    Period  Weeks    Status  New    Target Date  03/14/18      PT LONG TERM GOAL #3   Title  Pt. able to ambulate 100 feet with consistent 2 point gait pattern and use of SPC to improve household mobility.     Baseline  Pt. currently using RW    Time  4    Period  Weeks    Status  New  Target Date  03/14/18      PT LONG TERM GOAL #4   Title  Pt. able to stand from normal chair with no UE assist to improve safety/independence with transfers.     Baseline  extra time and UE assist required to stand    Time  4    Period  Weeks    Status  New    Target Date  03/14/18              Patient will benefit from skilled therapeutic intervention in order to improve the following deficits and impairments:  Abnormal gait, Decreased balance, Decreased endurance, Decreased mobility, Difficulty walking, Decreased range of motion, Decreased activity tolerance, Decreased strength, Impaired flexibility, Postural dysfunction, Pain  Visit Diagnosis: Muscle weakness (generalized)  Gait difficulty  Abnormal posture     Problem List Patient Active Problem List   Diagnosis Date Noted  . 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 HLD (hyperlipidemia) 03/24/2018  . Contusion of right knee 02/14/2018  . Bradycardia 12/28/2017  . Status post right unicompartmental knee replacement  11/03/2017  . Chronic pain of right knee 08/10/2016  . Right ankle pain 08/10/2016  . Venous insufficiency of both lower extremities 05/13/2016  . 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 Lymphoma, 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[place]) 04/03/2015  . Breathlessness on exertion 11/21/2014  . Breath shortness 11/21/2014  . Arthropathy 11/07/2014  . Atrial flutter, paroxysmal ([place]) 11/07/2014  . Type 2 diabetes mellitus ([place]) 11/07/2014  . Benign essential tremor 11/07/2014  . Benign essential HTN 11/07/2014  . Mixed incontinence 11/07/2014  . Hypercholesterolemia without hypertriglyceridemia 11/07/2014  . Apnea, sleep 11/07/2014  . Controlled type 2 diabetes mellitus without complication ([place]) 68/11/2120  . Pure hypercholesterolemia 11/07/2014  . Other abnormalities of gait and mobility 11/02/2011  . Abnormal gait 06/29/2011  . Discoordination 06/29/2011   Pura Spice, PT, DPT # [phone] 02/24/2018, 8:37 AM  [facility] [address] [place], [place], [address] Phone: [phone]   Fax:  [phone]  Name: Mike Holloway MRN: [id number] Date of Birth: [birth year]

## 2018-02-26 NOTE — Addendum Note (Signed)
Addended by: Pura Spice on: 02/26/2018 10:27 AM   Modules accepted: Orders

## 2018-02-27 ENCOUNTER — Ambulatory Visit: Payer: Medicare Other | Attending: Neurosurgery | Admitting: Physical Therapy

## 2018-02-27 DIAGNOSIS — R293 Abnormal posture: Secondary | ICD-10-CM | POA: Diagnosis present

## 2018-02-27 DIAGNOSIS — M6281 Muscle weakness (generalized): Secondary | ICD-10-CM | POA: Insufficient documentation

## 2018-02-27 DIAGNOSIS — R269 Unspecified abnormalities of gait and mobility: Secondary | ICD-10-CM

## 2018-03-01 ENCOUNTER — Ambulatory Visit: Payer: Medicare Other | Admitting: Physical Therapy

## 2018-03-01 DIAGNOSIS — M6281 Muscle weakness (generalized): Secondary | ICD-10-CM | POA: Diagnosis not present

## 2018-03-01 DIAGNOSIS — R293 Abnormal posture: Secondary | ICD-10-CM

## 2018-03-01 DIAGNOSIS — R269 Unspecified abnormalities of gait and mobility: Secondary | ICD-10-CM

## 2018-03-03 NOTE — Therapy (Addendum)
[place] [facility] [address] [place], [place], [address] Phone: [phone]   Fax:  [phone]  Physical Therapy Treatment  Patient Details  Name: Mike Holloway MRN: [id number] Date of Birth: 17-Oct-1940 Referring Provider: Dr. Manson Passey   Encounter Date: 02/27/2018   Treatment: 5 of 8.  Recert date: 5/63/14   Past Medical History:  Diagnosis Date  . Arthritis   . Atrial flutter ([place])   . Diabetes mellitus ([place])   . Essential tremor   . Hypercholesteremia   . Hypertension   . Incontinence   . 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[place])    grew on the testical  . Sleep apnea   . Stroke [facility])     Past Surgical History:  Procedure Laterality Date  . ABLATION    . APPENDECTOMY    . CATARACT EXTRACTION, BILATERAL    . COLONOSCOPY WITH PROPOFOL N/A 03/17/2018   Procedure: COLONOSCOPY WITH PROPOFOL;  Surgeon: Jonathon Bellows, MD;  Location: [facility] ENDOSCOPY;  Service: Gastroenterology;  Laterality: N/A;  . DEEP BRAIN STIMULATOR PLACEMENT    . 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   Procedure: FECAL TRANSPLANT;  Surgeon: Jonathon Bellows, MD;  Location: [facility] ENDOSCOPY;  Service: Gastroenterology;  Laterality: N/A;  . HEMORRHOID SURGERY    . HERNIA REPAIR    . NASAL SINUS SURGERY    . ORCHIECTOMY    . TONSILLECTOMY      There were no vitals filed for this visit.    No new complaints.       Treatment:   There.ex.:  Scifit L6 10 min. B UE/LE (no charge/ warm-up). Standing hip ex. (all planes)- 3# Step ups/downs with CGA for assist 10x2 (//-bars/ mirror feedback). Seated hip flexion/ knee flexion with RTB 20x./ alt. UE and LE  Neuro:  Hallway activities (turning/ head turning/ ball tossing) STS from varying surfaces (19.5") Obstacle course/ mini-squats/ step overs/ outside walking       No LOB during treatment session/ obstacle course balance tasks in hallway. Pt. showing consistent progress with B LE muscle strengthening/ sit to  stands from low or soft chairs. No c/o pain during tx. session.      PT Long Term Goals - 02/26/18 1017      PT LONG TERM GOAL #1   Title  Pt. independent with HEP to increase B hip flexion/ abduction strength 1/2 muscle grade to improve pain-free mobility.      Baseline  B quad/ hamtring strength grossly 5/5 MMT and B hip flexion/ abd. 4/5 MMT.    Time  4    Period  Weeks    Status  New    Target Date  03/14/18      PT LONG TERM GOAL #2   Title  Pt. will increase Berg balance test to >45 out of 56 to improve independence with gait/ decrease fall risk.      Baseline  Berg: 37/56    Time  4    Period  Weeks    Status  New    Target Date  03/14/18      PT LONG TERM GOAL #3   Title  Pt. able to ambulate 100 feet with consistent 2 point gait pattern and use of SPC to improve household mobility.     Baseline  Pt. currently using RW    Time  4    Period  Weeks    Status  New    Target Date  03/14/18  PT LONG TERM GOAL #4   Title  Pt. able to stand from normal chair with no UE assist to improve safety/independence with transfers.     Baseline  extra time and UE assist required to stand    Time  4    Period  Weeks    Status  New    Target Date  03/14/18              Patient will benefit from skilled therapeutic intervention in order to improve the following deficits and impairments:  Abnormal gait, Decreased balance, Decreased endurance, Decreased mobility, Difficulty walking, Decreased range of motion, Decreased activity tolerance, Decreased strength, Impaired flexibility, Postural dysfunction, Pain  Visit Diagnosis: Muscle weakness (generalized)  Gait difficulty  Abnormal posture     Problem List Patient Active Problem List   Diagnosis Date Noted  . 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 HLD (hyperlipidemia) 03/24/2018  . Contusion of right knee 02/14/2018  . Bradycardia 12/28/2017  . Status post right unicompartmental knee replacement  11/03/2017  . Chronic pain of right knee 08/10/2016  . Right ankle pain 08/10/2016  . Venous insufficiency of both lower extremities 05/13/2016  . 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[place]) 12/11/2015  . 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 Lymphoma, 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[place]) 04/03/2015  . Breathlessness on exertion 11/21/2014  . Breath shortness 11/21/2014  . Arthropathy 11/07/2014  . Atrial flutter, paroxysmal ([place]) 11/07/2014  . Type 2 diabetes mellitus ([place]) 11/07/2014  . Benign essential tremor 11/07/2014  . Benign essential HTN 11/07/2014  . Mixed incontinence 11/07/2014  . Hypercholesterolemia without hypertriglyceridemia 11/07/2014  . Apnea, sleep 11/07/2014  . Controlled type 2 diabetes mellitus without complication ([place]) 24/40/1027  . Pure hypercholesterolemia 11/07/2014  . Other abnormalities of gait and mobility 11/02/2011  . Abnormal gait 06/29/2011  . Discoordination 06/29/2011   Pura Spice, PT, DPT # [phone] 03/01/2018, 11:36 AM  [place] [facility] [address] [place], [place], [address] Phone: [phone]   Fax:  [phone]  Name: Mike Holloway MRN: [id number] Date of Birth: 06/06/1941

## 2018-03-03 NOTE — Therapy (Addendum)
[place] [facility] [address]. [place], [place], [address] Phone: [phone]   Fax:  [phone]  Physical Therapy Treatment  Patient Details  Name: Mike Holloway MRN: [id number] Date of Birth: [birth year] Referring Provider: Dr. Manson Passey   Encounter Date: 03/01/2018   Treatment: 6 of 8.  Recert date: 03/12/06   Past Medical History:  Diagnosis Date  . Arthritis   . Atrial flutter ([place])   . Diabetes mellitus ([place])   . Essential tremor   . Hypercholesteremia   . Hypertension   . Incontinence   . 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[place])    grew on the testical  . Sleep apnea   . Stroke [facility])     Past Surgical History:  Procedure Laterality Date  . ABLATION    . APPENDECTOMY    . CATARACT EXTRACTION, BILATERAL    . COLONOSCOPY WITH PROPOFOL N/A 03/17/2018   Procedure: COLONOSCOPY WITH PROPOFOL;  Surgeon: Jonathon Bellows, MD;  Location: [facility] ENDOSCOPY;  Service: Gastroenterology;  Laterality: N/A;  . DEEP BRAIN STIMULATOR PLACEMENT    . 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 HEMORRHOID SURGERY    . HERNIA REPAIR    . NASAL SINUS SURGERY    . ORCHIECTOMY    . TONSILLECTOMY      There were no vitals filed for this visit.      Pt./wife report pt. is staying active at home and walking on a regular basis. No c/o toe pain today.       Treatment:   There.ex.:  Scifit L6 10 min. B UE/LE (no charge/ warm-up). Standing hip ex. (all planes)- 3# Step ups/downs with CGA for assist 10x2 (//-bars/ mirror feedback). Seated hip flexion/ knee flexion with RTB 20x./ alt. UE and LE  Neuro:  Hallway activities (turning/ head turning/ ball tossing) STS from varying surfaces (19.5") Obstacle course/ mini-squats/ step overs/ outside walking       Pt. continues to work hard during tx. session and is becoming  independent with progresive HEP/ strengthening ex. Pt. issued new HEP handouts today to promote increase strengthening. Pt. progressing to use of SPC with mod. I/ SBA for safety on varying surfaces. No falls at home or in PT clinic.       PT Long Term Goals - 02/26/18 1017      PT LONG TERM GOAL #1   Title  Pt. independent with HEP to increase B hip flexion/ abduction strength 1/2 muscle grade to improve pain-free mobility.      Baseline  B quad/ hamtring strength grossly 5/5 MMT and B hip flexion/ abd. 4/5 MMT.    Time  4    Period  Weeks    Status  New    Target Date  03/14/18      PT LONG TERM GOAL #2   Title  Pt. will increase Berg balance test to >45 out of 56 to improve independence with gait/ decrease fall risk.      Baseline  Berg: 37/56    Time  4    Period  Weeks    Status  New    Target Date  03/14/18      PT LONG TERM GOAL #3   Title  Pt. able to ambulate 100 feet with consistent 2 point gait pattern and use of SPC to improve household mobility.     Baseline  Pt. currently using RW    Time  4    Period  Weeks    Status  New    Target Date  03/14/18      PT LONG TERM GOAL #4   Title  Pt. able to stand from normal chair with no UE assist to improve safety/independence with transfers.     Baseline  extra time and UE assist required to stand    Time  4    Period  Weeks    Status  New    Target Date  03/14/18              Patient will benefit from skilled therapeutic intervention in order to improve the following deficits and impairments:  Abnormal gait, Decreased balance, Decreased endurance, Decreased mobility, Difficulty walking, Decreased range of motion, Decreased activity tolerance, Decreased strength, Impaired flexibility, Postural dysfunction, Pain  Visit Diagnosis: Muscle weakness (generalized)  Gait difficulty  Abnormal posture     Problem List Patient Active Problem List   Diagnosis Date Noted  . Recurrent Clostridium difficile  diarrhea 03/24/2018  . HLD (hyperlipidemia) 03/24/2018  . Contusion of right knee 02/14/2018  . Bradycardia 12/28/2017  . 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 Chronic pain of right knee 08/10/2016  . Right ankle pain 08/10/2016  . Venous insufficiency of both lower extremities 05/13/2016  . 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 Lymphoma, 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[place]) 04/03/2015  . Breathlessness on exertion 11/21/2014  . Breath shortness 11/21/2014  . Arthropathy 11/07/2014  . Atrial flutter, paroxysmal ([place]) 11/07/2014  . Type 2 diabetes mellitus ([place]) 11/07/2014  . Benign essential tremor 11/07/2014  . Benign essential HTN 11/07/2014  . Mixed incontinence 11/07/2014  . Hypercholesterolemia without hypertriglyceridemia 11/07/2014  . Apnea, sleep 11/07/2014  . Controlled type 2 diabetes mellitus without complication ([place]) 57/26/2035  . Pure hypercholesterolemia 11/07/2014  . Other abnormalities of gait and mobility 11/02/2011  . Abnormal gait 06/29/2011  . Discoordination 06/29/2011   Pura Spice, PT, DPT # [phone] 03/03/2018, 11:48 AM  [place] [facility] [address] [place], [place], [address] Phone: [phone]   Fax:  [phone]  Name: Mike Holloway MRN: [id number] Date of Birth: [birth year]

## 2018-03-06 ENCOUNTER — Ambulatory Visit: Payer: Medicare Other | Admitting: Physical Therapy

## 2018-03-06 DIAGNOSIS — M6281 Muscle weakness (generalized): Secondary | ICD-10-CM

## 2018-03-06 DIAGNOSIS — R269 Unspecified abnormalities of gait and mobility: Secondary | ICD-10-CM

## 2018-03-06 DIAGNOSIS — R293 Abnormal posture: Secondary | ICD-10-CM

## 2018-03-08 ENCOUNTER — Ambulatory Visit: Payer: Medicare Other | Admitting: Physical Therapy

## 2018-03-09 ENCOUNTER — Other Ambulatory Visit: Payer: Self-pay

## 2018-03-09 ENCOUNTER — Other Ambulatory Visit
Admission: RE | Admit: 2018-03-09 | Discharge: 2018-03-09 | Disposition: A | Discharge status: Home / Self Care | Payer: Medicare Other | Source: Ambulatory Visit | Attending: Gastroenterology | Admitting: Gastroenterology

## 2018-03-09 DIAGNOSIS — R197 Diarrhea, unspecified: Secondary | ICD-10-CM | POA: Insufficient documentation

## 2018-03-09 DIAGNOSIS — Z114 Encounter for screening for human immunodeficiency virus [HIV]: Secondary | ICD-10-CM

## 2018-03-09 DIAGNOSIS — A0472 Enterocolitis due to Clostridium difficile, not specified as recurrent: Secondary | ICD-10-CM

## 2018-03-09 LAB — CLOSTRIDIUM DIFFICILE BY PCR, REFLEXED: Toxigenic C. Difficile by PCR: POSITIVE — AB

## 2018-03-09 LAB — C DIFFICILE QUICK SCREEN W PCR REFLEX
C Diff antigen: NEGATIVE
C Diff toxin: POSITIVE — AB

## 2018-03-09 MED ORDER — VANCOMYCIN HCL 125 MG PO CAPS: 125.0000 mg | ORAL_CAPSULE | Freq: Four times a day (QID) | ORAL | 0 refills | Status: DC

## 2018-03-10 ENCOUNTER — Other Ambulatory Visit
Admission: RE | Admit: 2018-03-10 | Discharge: 2018-03-10 | Disposition: A | Discharge status: Home / Self Care | Payer: Medicare Other | Source: Ambulatory Visit | Attending: Gastroenterology | Admitting: Gastroenterology

## 2018-03-10 DIAGNOSIS — A0472 Enterocolitis due to Clostridium difficile, not specified as recurrent: Secondary | ICD-10-CM | POA: Diagnosis present

## 2018-03-10 DIAGNOSIS — Z114 Encounter for screening for human immunodeficiency virus [HIV]: Secondary | ICD-10-CM | POA: Diagnosis present

## 2018-03-10 NOTE — Therapy (Addendum)
[place] [facility] [address]. [place], [place], [address] Phone: [phone]   Fax:  [phone]  Physical Therapy Treatment  Patient Details  Name: Mike Holloway MRN: [id number] Date of Birth: 1940/10/03 Referring Provider: Dr. Manson Passey   Encounter Date: 03/06/2018  Treatment: 7 of 8.  Recert date: 7/42/59  Past Medical History:  Diagnosis Date  . Arthritis   . Atrial flutter ([place])   . Diabetes mellitus ([place])   . Essential tremor   . Hypercholesteremia   . Hypertension   . Incontinence   . 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 Sleep apnea   . Stroke [facility])     Past Surgical History:  Procedure Laterality Date  . ABLATION    . APPENDECTOMY    . CATARACT EXTRACTION, BILATERAL    . COLONOSCOPY WITH PROPOFOL N/A 03/17/2018   Procedure: COLONOSCOPY WITH PROPOFOL;  Surgeon: Jonathon Bellows, MD;  Location: [facility] ENDOSCOPY;  Service: Gastroenterology;  Laterality: N/A;  . DEEP BRAIN STIMULATOR PLACEMENT    . 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 HEMORRHOID SURGERY    . HERNIA REPAIR    . NASAL SINUS SURGERY    . ORCHIECTOMY    . TONSILLECTOMY      There were no vitals filed for this visit.    No new complaints.         Treatment:   There.ex.:  Scifit L6 10 min. B UE/LE (no charge/ warm-up). Reviewed HEP in depth (see handouts)- instructed with wife present Step ups/downs with CGA for assist 10x2 (//-bars/ mirror feedback). Seated hip flexion/ knee flexion with RTB 20x./ alt. UE and LE  Neuro:  Hallway activities (turning/ head turning/ ball tossing/ alt. UE and LE). Obstacle course/ mini-squats/ step overs/ outside walking [place] tasks.        Pt. progressing to use of SPC on a mod. I basis with consistent step pattern/ proper BOS noted. Pt. requires cuing to  correct upright posture/ head position with walking/ obstacle course and ascending/ descending stairs.       PT Long Term Goals - 02/26/18 1017      PT LONG TERM GOAL #1   Title  Pt. independent with HEP to increase B hip flexion/ abduction strength 1/2 muscle grade to improve pain-free mobility.      Baseline  B quad/ hamtring strength grossly 5/5 MMT and B hip flexion/ abd. 4/5 MMT.    Time  4    Period  Weeks    Status  New    Target Date  03/14/18      PT LONG TERM GOAL #2   Title  Pt. will increase Berg balance test to >45 out of 56 to improve independence with gait/ decrease fall risk.      Baseline  Berg: 37/56    Time  4    Period  Weeks    Status  New    Target Date  03/14/18      PT LONG TERM GOAL #3   Title  Pt. able to ambulate 100 feet with consistent 2 point gait pattern and use of SPC to improve household mobility.     Baseline  Pt. currently using RW    Time  4    Period  Weeks    Status  New    Target  Date  03/14/18      PT LONG TERM GOAL #4   Title  Pt. able to stand from normal chair with no UE assist to improve safety/independence with transfers.     Baseline  extra time and UE assist required to stand    Time  4    Period  Weeks    Status  New    Target Date  03/14/18              Patient will benefit from skilled therapeutic intervention in order to improve the following deficits and impairments:  Abnormal gait, Decreased balance, Decreased endurance, Decreased mobility, Difficulty walking, Decreased range of motion, Decreased activity tolerance, Decreased strength, Impaired flexibility, Postural dysfunction, Pain  Visit Diagnosis: Muscle weakness (generalized)  Gait difficulty  Abnormal posture     Problem List Patient Active Problem List   Diagnosis Date Noted  . 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 HLD (hyperlipidemia) 03/24/2018  . Contusion of right knee 02/14/2018  . Bradycardia 12/28/2017  . Status post  right unicompartmental knee replacement 11/03/2017  . Chronic pain of right knee 08/10/2016  . Right ankle pain 08/10/2016  . Venous insufficiency of both lower extremities 05/13/2016  . 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 Lymphoma, 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[place]) 04/03/2015  . Breathlessness on exertion 11/21/2014  . Breath shortness 11/21/2014  . Arthropathy 11/07/2014  . Atrial flutter, paroxysmal ([place]) 11/07/2014  . Type 2 diabetes mellitus ([place]) 11/07/2014  . Benign essential tremor 11/07/2014  . Benign essential HTN 11/07/2014  . Mixed incontinence 11/07/2014  . Hypercholesterolemia without hypertriglyceridemia 11/07/2014  . Apnea, sleep 11/07/2014  . Controlled type 2 diabetes mellitus without complication ([place]) 68/08/7516  . Pure hypercholesterolemia 11/07/2014  . Other abnormalities of gait and mobility 11/02/2011  . Abnormal gait 06/29/2011  . Discoordination 06/29/2011   Pura Spice, PT, DPT # [phone] 03/12/2018, 12:02 PM  [place] [facility] [address] [place], [place], [address] Phone: [phone]   Fax:  [phone]  Name: Zeke Aker MRN: [id number] Date of Birth: [birth year]

## 2018-03-11 ENCOUNTER — Other Ambulatory Visit
Admission: RE | Admit: 2018-03-11 | Discharge: 2018-03-11 | Disposition: A | Discharge status: Home / Self Care | Payer: Medicare Other | Source: Ambulatory Visit | Attending: Gastroenterology | Admitting: Gastroenterology

## 2018-03-11 DIAGNOSIS — A09 Infectious gastroenteritis and colitis, unspecified: Secondary | ICD-10-CM | POA: Diagnosis not present

## 2018-03-11 DIAGNOSIS — A0472 Enterocolitis due to Clostridium difficile, not specified as recurrent: Secondary | ICD-10-CM | POA: Insufficient documentation

## 2018-03-11 LAB — GASTROINTESTINAL PANEL BY PCR, STOOL (REPLACES STOOL CULTURE)

## 2018-03-11 LAB — HEPATITIS B SURFACE ANTIGEN: Hepatitis B Surface Ag: NEGATIVE

## 2018-03-11 LAB — HIV ANTIBODY (ROUTINE TESTING W REFLEX): HIV Screen 4th Generation wRfx: NONREACTIVE

## 2018-03-11 LAB — HEPATITIS A ANTIBODY, TOTAL: Hep A Total Ab: NEGATIVE

## 2018-03-11 LAB — HEPATITIS C ANTIBODY: HCV Ab: 0.3 s/co ratio (ref 0.0–0.9)

## 2018-03-12 ENCOUNTER — Encounter: Payer: Self-pay | Admitting: Gastroenterology

## 2018-03-12 LAB — RPR: RPR Ser Ql: NONREACTIVE

## 2018-03-13 ENCOUNTER — Ambulatory Visit: Payer: Medicare Other | Admitting: Physical Therapy

## 2018-03-14 ENCOUNTER — Encounter: Payer: Self-pay | Admitting: Gastroenterology

## 2018-03-14 ENCOUNTER — Ambulatory Visit (INDEPENDENT_AMBULATORY_CARE_PROVIDER_SITE_OTHER): Payer: Medicare Other | Admitting: Gastroenterology

## 2018-03-14 VITALS — BP 111/72 | HR 73 | Ht 68.0 in | Wt 196.4 lb

## 2018-03-14 DIAGNOSIS — A0472 Enterocolitis due to Clostridium difficile, not specified as recurrent: Secondary | ICD-10-CM

## 2018-03-14 NOTE — Progress Notes (Signed)
Jonathon Bellows MD, MRCP(U.K) [address]  [place]  [place], [place] [address]  Main: [phone]  Fax: [phone]   Primary Care Physician: [name], MD  Primary Gastroenterologist:  Dr. Jonathon Bellows   No chief complaint on file.   HPI: Mike Holloway is a 77 y.o. 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 He is a patient of Dr Allen Norris seen in 08/2017 and was treated for recurrent C diff.   Just a few days back there was a FDA alert due to transmission of a multi drug resistant bacteria .    Labs 11/2017 - C diff antigen positive , 02/2018 C diff PCR positive.   First episode in 2009, 2010 and recently has had 3 episodes. Last recurrence was 6/12 after he had finished his vancomycin taper down.      Current Outpatient Medications  Medication Sig Dispense Refill  . senna-docusate (SENOKOT-S) 8.6-50 MG tablet Take by mouth.    [name] aspirin 81 MG tablet Take 81 mg by mouth daily.    . Cholecalciferol (VITAMIN D-1000 MAX ST) 1000 UNITS tablet Take by mouth.    . diclofenac sodium (VOLTAREN) 1 % GEL Apply 2 g topically 4 (four) times daily.    [name] docusate sodium (COLACE) 100 MG capsule Take by mouth.    . famotidine (PEPCID) 20 MG tablet Take 20 mg by mouth 2 (two) times daily.    [name] FLUoxetine (PROZAC) 10 MG capsule   2  . FLUoxetine (PROZAC) 10 MG tablet Take 10 mg by mouth daily.    . Ibuprofen (RA IBUPROFEN) 200 MG CAPS Take by mouth.    [name] LACTOBACILLUS PO Take by mouth.    . meloxicam (MOBIC) 15 MG tablet Take 15 mg by mouth daily.    . Menthol, Topical Analgesic, 10 % LIQD     . methylcellulose (CITRUCEL) oral powder Take by mouth.    . mirabegron ER (MYRBETRIQ) 50 MG TB24 tablet Take 1 tablet (50 mg total) by mouth daily. 30 tablet 11  . montelukast (SINGULAIR) 10 MG tablet Take 10 mg by mouth at bedtime.    [name] oxyCODONE (OXY IR/ROXICODONE) 5 MG immediate release tablet TAKE 1/2 -1 TABLET BY MOUTH EVERY  4 HOURS AS NEEDED  0  . Polyethylene Glycol 3350 (MIRALAX PO) Take by mouth.    . primidone (MYSOLINE) 50 MG tablet Take 50 mg by mouth 4 (four) times daily.     . Probiotic Product (VSL#3 PO) Take by mouth.    . propranolol ER (INDERAL LA) 80 MG 24 hr capsule Take 80 mg by mouth 2 (two) times daily.    . tamsulosin (FLOMAX) 0.4 MG CAPS capsule Take 0.4 mg by mouth daily.    . Triamcinolone Acetonide (NASACORT AQ NA) Place into the nose.    . triamterene-hydrochlorothiazide (MAXZIDE-25) 37.5-25 MG tablet Take 1 tablet by mouth daily.    . vancomycin (VANCOCIN HCL) 125 MG capsule Take 1 capsule (125 mg total) by mouth 4 (four) times daily. 30 capsule 0   No current facility-administered medications for this visit.     Allergies as of 03/14/2018 - Review Complete 02/14/2018  Allergen Reaction Noted  . Clindamycin Diarrhea 12/11/2015  . Ambien  [zolpidem]  06/10/2015  . Penicillins  02/12/2015  . Sulfa antibiotics  02/12/2015    ROS:  General: Negative for anorexia, weight loss, fever, chills, fatigue, weakness. ENT: Negative for  hoarseness, difficulty swallowing , nasal congestion. CV: Negative for chest pain, angina, palpitations, dyspnea on exertion, peripheral edema.  Respiratory: Negative for dyspnea at rest, dyspnea on exertion, cough, sputum, wheezing.  GI: See history of present illness. GU:  Negative for dysuria, hematuria, urinary incontinence, urinary frequency, nocturnal urination.  Endo: Negative for unusual weight change.    Physical Examination:   There were no vitals taken for this visit.  General: Well-nourished, well-developed in no acute distress.  Eyes: No icterus. Conjunctivae pink. Mouth: Oropharyngeal mucosa moist and pink , no lesions erythema or exudate. Lungs: Clear to auscultation bilaterally. Non-labored. Heart: Regular rate and rhythm, no murmurs rubs or gallops.  Abdomen: Bowel sounds are normal, nontender, nondistended, no hepatosplenomegaly or  masses, no abdominal bruits or hernia , no rebound or guarding.   Extremities: No lower extremity edema. No clubbing or deformities. Neuro: Alert and oriented x 3.  Grossly intact. Skin: Warm and dry, no jaundice.   Psych: Alert and cooperative, normal mood and affect.   Imaging Studies: No results found.  Assessment and Plan:   Mike Holloway is a 77 y.o. y/o male for recurrent C diff. Per guidelines as it is the third recurrence, stool transplant is recommended. Discussed risks vs benefits including but not limited to perforation , bleeding , infection and risks as stated below as per new FDA alert .    Plan   1. Stool transplant via colonoscopy with anesthesia.    Explained as below:  Transmission of antibiotic-resistant bacteria: These are bacteria that are resistant to one or more antibiotics. These bacteria could be transmitted through FMT and cause a serious infection. The FMT that you will receive is provided by a company called [employer] where donors who provide stool for the FMT treatments undergo screening for certain antibiotic-resistant bacteria and clinical risk factors that would make their exposure to antibiotic-resistant bacteria more likely. Individuals who test positive for these bacteria or have these risk factors are excluded from donating.   I have discussed alternative options, risks & benefits,  which include, but are not limited to, bleeding, infection, perforation,respiratory complication & drug reaction.  The patient agrees with this plan & written consent will be obtained.     Dr Jonathon Bellows  MD,MRCP [facility]) Follow up as needed

## 2018-03-15 ENCOUNTER — Telehealth: Payer: Self-pay | Admitting: Urology

## 2018-03-15 ENCOUNTER — Ambulatory Visit: Payer: Medicare Other | Admitting: Physical Therapy

## 2018-03-15 NOTE — Telephone Encounter (Signed)
Pt wife, [name], [facility] stating that her and the pt have been instructed to call office when pt is down to 4 weeks of Mybetriq, pt needs more samples. Please call Pat @ [phone]. Thanks

## 2018-03-17 ENCOUNTER — Encounter: Payer: Self-pay | Admitting: *Deleted

## 2018-03-17 ENCOUNTER — Ambulatory Visit
Admission: RE | Admit: 2018-03-17 | Discharge: 2018-03-17 | Disposition: A | Discharge status: Home / Self Care | Payer: Medicare Other | Source: Ambulatory Visit | Attending: Gastroenterology | Admitting: Gastroenterology

## 2018-03-17 ENCOUNTER — Encounter
Admission: RE | Disposition: A | Discharge status: Home / Self Care | Payer: Self-pay | Source: Ambulatory Visit | Attending: Gastroenterology

## 2018-03-17 ENCOUNTER — Other Ambulatory Visit: Payer: Self-pay

## 2018-03-17 ENCOUNTER — Ambulatory Visit: Payer: Medicare Other | Admitting: Anesthesiology

## 2018-03-17 DIAGNOSIS — Z881 Allergy status to other antibiotic agents status: Secondary | ICD-10-CM | POA: Diagnosis not present

## 2018-03-17 DIAGNOSIS — Z882 Allergy status to sulfonamides status: Secondary | ICD-10-CM | POA: Insufficient documentation

## 2018-03-17 DIAGNOSIS — A0472 Enterocolitis due to Clostridium difficile, not specified as recurrent: Secondary | ICD-10-CM | POA: Diagnosis not present

## 2018-03-17 DIAGNOSIS — E7800 Pure hypercholesterolemia, unspecified: Secondary | ICD-10-CM | POA: Diagnosis not present

## 2018-03-17 DIAGNOSIS — G250 Essential tremor: Secondary | ICD-10-CM | POA: Diagnosis not present

## 2018-03-17 DIAGNOSIS — I10 Essential (primary) hypertension: Secondary | ICD-10-CM | POA: Insufficient documentation

## 2018-03-17 DIAGNOSIS — I4892 Unspecified atrial flutter: Secondary | ICD-10-CM | POA: Insufficient documentation

## 2018-03-17 DIAGNOSIS — Z880 Allergy status to penicillin: Secondary | ICD-10-CM | POA: Insufficient documentation

## 2018-03-17 DIAGNOSIS — Z8673 Personal history of transient ischemic attack (TIA), and cerebral infarction without residual deficits: Secondary | ICD-10-CM | POA: Insufficient documentation

## 2018-03-17 DIAGNOSIS — A0471 Enterocolitis due to Clostridium difficile, recurrent: Secondary | ICD-10-CM | POA: Insufficient documentation

## 2018-03-17 DIAGNOSIS — M1990 Unspecified osteoarthritis, unspecified site: Secondary | ICD-10-CM | POA: Diagnosis not present

## 2018-03-17 DIAGNOSIS — E1151 Type 2 diabetes mellitus with diabetic peripheral angiopathy without gangrene: Secondary | ICD-10-CM | POA: Insufficient documentation

## 2018-03-17 DIAGNOSIS — K635 Polyp of colon: Secondary | ICD-10-CM | POA: Diagnosis not present

## 2018-03-17 DIAGNOSIS — G4730 Sleep apnea, unspecified: Secondary | ICD-10-CM | POA: Diagnosis not present

## 2018-03-17 DIAGNOSIS — Z8572 Personal history of non-Hodgkin lymphomas: Secondary | ICD-10-CM | POA: Insufficient documentation

## 2018-03-17 DIAGNOSIS — Z79899 Other long term (current) drug therapy: Secondary | ICD-10-CM | POA: Insufficient documentation

## 2018-03-17 DIAGNOSIS — Z888 Allergy status to other drugs, medicaments and biological substances status: Secondary | ICD-10-CM | POA: Diagnosis not present

## 2018-03-17 HISTORY — PX: COLONOSCOPY WITH PROPOFOL: SHX5780

## 2018-03-17 HISTORY — PX: FECAL TRANSPLANT: SHX6383

## 2018-03-17 SURGERY — COLONOSCOPY WITH PROPOFOL
Anesthesia: General

## 2018-03-17 MED ORDER — LOPERAMIDE HCL 2 MG PO TABS: ORAL_TABLET | ORAL | 0 refills | Status: DC

## 2018-03-17 MED ORDER — PROPOFOL 10 MG/ML IV BOLUS
INTRAVENOUS | Status: DC | PRN
  Administered 2018-03-17: 30 mg via INTRAVENOUS
  Administered 2018-03-17: 80 mg via INTRAVENOUS

## 2018-03-17 MED ORDER — PROPOFOL 500 MG/50ML IV EMUL
INTRAVENOUS | Status: DC | PRN
  Administered 2018-03-17: 120 ug/kg/min via INTRAVENOUS

## 2018-03-17 MED ORDER — SODIUM CHLORIDE 0.9 % IV SOLN
INTRAVENOUS | Status: DC
  Administered 2018-03-17: 1000 mL via INTRAVENOUS

## 2018-03-17 NOTE — Anesthesia Post-op Follow-up Note (Signed)
Anesthesia QCDR form completed.        

## 2018-03-17 NOTE — H&P (Signed)
Mike Bellows, MD [address] [place], [place], [place], [address] [address], [place], [place], [place], [address] Phone: [phone]  Fax: [phone]  Primary Care Physician:  [name], MD   Pre-Procedure History & Physical: HPI:  Pedrohenrique Mcconville is a 77 y.o. male is here for an colonoscopy.   Past Medical History:  Diagnosis Date  . Arthritis   . Atrial flutter ([place])   . Diabetes mellitus ([place])   . Essential tremor   . Hypercholesteremia   . Hypertension   . Incontinence   . 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 Sleep apnea   . Stroke [facility])     Past Surgical History:  Procedure Laterality Date  . ABLATION    . APPENDECTOMY    . CATARACT EXTRACTION, BILATERAL    . DEEP BRAIN STIMULATOR PLACEMENT    . HEMORRHOID SURGERY    . HERNIA REPAIR    . NASAL SINUS SURGERY    . ORCHIECTOMY    . TONSILLECTOMY      Prior to Admission medications   Medication Sig Start Date End Date Taking? Authorizing Provider  Cholecalciferol (VITAMIN D-1000 MAX ST) 1000 UNITS tablet Take by mouth.   Yes [provider]  diclofenac sodium (VOLTAREN) 1 % GEL Apply 2 g topically 4 (four) times daily.   Yes [provider]  docusate sodium (COLACE) 100 MG capsule Take by mouth.   Yes [provider]  FLUoxetine (PROZAC) 10 MG capsule  03/10/18  Yes [provider]  FLUoxetine (PROZAC) 10 MG tablet Take 10 mg by mouth daily.   Yes [provider]  Ibuprofen (RA IBUPROFEN) 200 MG CAPS Take by mouth. 03/10/10  Yes [provider]  LACTOBACILLUS PO Take by mouth.   Yes [provider]  Menthol, Topical Analgesic, 10 % LIQD    Yes [provider]  mirabegron ER (MYRBETRIQ) 50 MG TB24 tablet Take 1 tablet (50 mg total) by mouth daily. 12/11/15  Yes Nickie Retort, MD  montelukast (SINGULAIR) 10 MG tablet Take 10 mg by mouth at bedtime.   Yes [provider]  Polyethylene  Glycol 3350 (MIRALAX PO) Take by mouth.   Yes [provider]  primidone (MYSOLINE) 50 MG tablet Take 50 mg by mouth 4 (four) times daily.    Yes [provider]  Probiotic Product (VSL#3 PO) Take by mouth.   Yes [provider]  propranolol ER (INDERAL LA) 80 MG 24 hr capsule Take 80 mg by mouth 2 (two) times daily.   Yes [provider]  tamsulosin (FLOMAX) 0.4 MG CAPS capsule Take 0.4 mg by mouth daily.   Yes [provider]  Triamcinolone Acetonide (NASACORT AQ NA) Place into the nose.   Yes [provider]  triamterene-hydrochlorothiazide (MAXZIDE-25) 37.5-25 MG tablet Take 1 tablet by mouth daily.   Yes [provider]  vancomycin (VANCOCIN HCL) 125 MG capsule Take 1 capsule (125 mg total) by mouth 4 (four) times daily. 03/09/18  Yes Mike Bellows, MD  methylcellulose (CITRUCEL) oral powder Take by mouth.    [provider]    Allergies as of 03/09/2018 - Review Complete 02/14/2018  Allergen Reaction Noted  . Clindamycin Diarrhea 12/11/2015  . Ambien  [zolpidem]  06/10/2015  . Penicillins  02/12/2015  . Sulfa antibiotics  02/12/2015    Family History  Problem Relation Age of Onset  . Hematuria Father   . Prostate  cancer Father   . Heart disease Mother   . Kidney disease Neg Hx   . Bladder Cancer Neg Hx     Social History   Socioeconomic History  . Marital status: Married    Spouse name: Not on file  . Number of children: Not on file  . Years of education: Not on file  . Highest education level: Not on file  Occupational History  . Not on file  Social Needs  . Financial resource strain: Not on file  . Food insecurity:    Worry: Not on file    Inability: Not on file  . Transportation needs:    Medical: Not on file    Non-medical: Not on file  Tobacco Use  . Smoking status: Never Smoker  . Smokeless tobacco: Never Used  Substance and Sexual Activity  . Alcohol use: No    Alcohol/week: 0.0 oz  .  Drug use: No  . Sexual activity: Yes  Lifestyle  . Physical activity:    Days per week: Not on file    Minutes per session: Not on file  . Stress: Not on file  Relationships  . Social connections:    Talks on phone: Not on file    Gets together: Not on file    Attends religious service: Not on file    Active member of club or organization: Not on file    Attends meetings of clubs or organizations: Not on file    Relationship status: Not on file  . Intimate partner violence:    Fear of current or ex partner: Not on file    Emotionally abused: Not on file    Physically abused: Not on file    Forced sexual activity: Not on file  Other Topics Concern  . Not on file  Social History Narrative  . Not on file    Review of Systems: See HPI, otherwise negative ROS  Physical Exam: There were no vitals taken for this visit. General:   Alert,  pleasant and cooperative in NAD Head:  Normocephalic and atraumatic. Neck:  Supple; no masses or thyromegaly. Lungs:  Clear throughout to auscultation, normal respiratory effort.    Heart:  +S1, +S2, Regular rate and rhythm, No edema. Abdomen:  Soft, nontender and nondistended. Normal bowel sounds, without guarding, and without rebound.   Neurologic:  Alert and  oriented x4;  grossly normal neurologically.  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  Questions have been answered.  All parties agreeable.   Mike Bellows, MD  03/17/2018, 10:33 AM

## 2018-03-17 NOTE — Anesthesia Preprocedure Evaluation (Addendum)
Anesthesia Evaluation  Patient identified by MRN, date of birth, ID band Patient awake    Reviewed: Allergy & Precautions, H&P , NPO status , Patient's Chart, lab work & pertinent test results  History of Anesthesia Complications (+) PROLONGED EMERGENCE and history of anesthetic complications  Airway Mallampati: III  TM Distance: >3 FB Neck ROM: limited    Dental  (+) Chipped, Poor Dentition, Missing   Pulmonary neg shortness of breath, sleep apnea ,           Cardiovascular Exercise Tolerance: Good hypertension, (-) angina+ Peripheral Vascular Disease  (-) Past MI + dysrhythmias Atrial Fibrillation      Neuro/Psych  Neuromuscular disease CVA, Residual Symptoms negative psych ROS   GI/Hepatic negative GI ROS, Neg liver ROS,   Endo/Other  diabetes, Type 2  Renal/GU negative Renal ROS  negative genitourinary   Musculoskeletal  (+) Arthritis ,   Abdominal   Peds  Hematology negative hematology ROS (+)   Anesthesia Other Findings Past Medical History: No date: Arthritis No date: Atrial flutter (HCC) No date: Diabetes mellitus (HCC) No date: Essential tremor No date: Hypercholesteremia No date: Hypertension No date: Incontinence No date: 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[place])     Comment:  grew on the testical No date: Sleep apnea No date: Stroke [facility])  Past Surgical History: No date: ABLATION No date: APPENDECTOMY No date: CATARACT EXTRACTION, BILATERAL No date: DEEP BRAIN STIMULATOR PLACEMENT No date: HEMORRHOID SURGERY No date: HERNIA REPAIR No date: NASAL SINUS SURGERY No date: ORCHIECTOMY No date: TONSILLECTOMY  BMI    Body Mass Index:  29.80 kg/m      Reproductive/Obstetrics negative OB ROS                           Anesthesia Physical Anesthesia Plan  ASA: III  Anesthesia Plan: General   Post-op Pain Management:    Induction: Intravenous  PONV Risk Score and Plan:  Propofol infusion and TIVA  Airway Management Planned: Natural Airway and Nasal Cannula  Additional Equipment:   Intra-op Plan:   Post-operative Plan:   Informed Consent: I have reviewed the patients History and Physical, chart, labs and discussed the procedure including the risks, benefits and alternatives for the proposed anesthesia with the patient or authorized representative who has indicated his/her understanding and acceptance.   Dental Advisory Given  Plan Discussed with: Anesthesiologist, CRNA and Surgeon  Anesthesia Plan Comments: (Patient consented for risks of anesthesia including but not limited to:  - adverse reactions to medications - risk of intubation if required - damage to teeth, lips or other oral mucosa - sore throat or hoarseness - Damage to heart, brain, lungs or loss of life  Patient voiced understanding.)        Anesthesia Quick Evaluation

## 2018-03-17 NOTE — Transfer of Care (Signed)
Immediate Anesthesia Transfer of Care Note  Patient: Mike Holloway  Procedure(s) Performed: COLONOSCOPY WITH PROPOFOL (N/A ) FECAL TRANSPLANT (N/A )  Patient Location: Endoscopy Unit  Anesthesia Type:General  Level of Consciousness: sedated  Airway & Oxygen Therapy: Patient Spontanous Breathing and Patient connected to nasal cannula oxygen  Post-op Assessment: Report given to RN and Post -op Vital signs reviewed and stable  Post vital signs: stable  Last Vitals:  Vitals Value Taken Time  BP 99/55 03/17/2018 12:03 PM  Temp 36 C 03/17/2018 12:01 PM  Pulse 58 03/17/2018 12:03 PM  Resp 12 03/17/2018 12:01 PM  SpO2 99 % 03/17/2018 12:03 PM    Last Pain:  Vitals:   03/17/18 1201  TempSrc: Tympanic  PainSc: 0-No pain         Complications: No apparent anesthesia complications

## 2018-03-17 NOTE — Op Note (Addendum)
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 Procedure:            Pre-Anesthesia Assessment:                       - Prior to the procedure, a History and Physical was                        performed, and patient medications, allergies and                        sensitivities were reviewed. The patient's tolerance of                        previous anesthesia was reviewed.                       - The risks and benefits of the procedure and the                        sedation options and risks were discussed with the                        patient. All questions were answered and informed                        consent was obtained.                       - ASA Grade Assessment: II - A patient with mild                        systemic disease.                       After obtaining informed consent, the colonoscope was                        passed under direct vision. Throughout the procedure,                        the patient's blood pressure, pulse, and oxygen                        saturations were monitored continuously. The                        Colonoscope was  introduced through the anus and                        advanced to the the cecum, identified by the                        appendiceal orifice, IC valve and transillumination.                        The colonoscopy was performed with ease. The patient               tolerated the procedure well. The quality of the bowel                        preparation was good. Findings:      The perianal and digital rectal examinations were normal.      A 20 mm polyp was found in the proximal ascending colon. The polyp was       sessile. Not excised today . Donor stool flushed into the cecum and       scope withdrawn. Impression:           - One 20 mm polyp in the proximal ascending colon.                       - No specimens collected. Recommendation:       - Discharge patient to home (with escort).                       - Resume regular diet in 2 hours                       Take Loperamine 2mg  every two hours for 3 doses .                       Retain the stool as long as possible                       - Continue present medications.                       - Needs repeat colonoscopy to excise large polyp seen                        in proximal ascending colon Procedure Code(s):    --- Professional ---                       [phone], Colonoscopy, flexible; diagnostic, including                        collection of specimen(s) by brushing or washing, when                        performed (separate procedure) Diagnosis Code(s):    --- Professional ---                       A04.71, Enterocolitis due to Clostridium difficile,                        recurrent  D12.2, Benign neoplasm of ascending colon CPT copyright 2017 American Medical Association. All rights reserved. The codes documented in this report are preliminary and upon coder review may  be revised to meet current compliance requirements. Jonathon Bellows, MD Jonathon Bellows MD, MD 03/17/2018 12:03:23 PM This report has been signed electronically. Number of Addenda: 0 Note Initiated On: 03/17/2018 11:35 AM Scope Withdrawal Time: 0 hours 3 minutes 10 seconds  Total Procedure Duration: 0 hours 12 minutes 24 seconds       [facility]

## 2018-03-18 NOTE — Anesthesia Postprocedure Evaluation (Signed)
Anesthesia Post Note  Patient: Mike Holloway  Procedure(s) Performed: COLONOSCOPY WITH PROPOFOL (N/A ) FECAL TRANSPLANT (N/A )  Patient location during evaluation: Endoscopy Anesthesia Type: General Level of consciousness: awake and alert Pain management: pain level controlled Vital Signs Assessment: post-procedure vital signs reviewed and stable Respiratory status: spontaneous breathing, nonlabored ventilation, respiratory function stable and patient connected to nasal cannula oxygen Cardiovascular status: blood pressure returned to baseline and stable Postop Assessment: no apparent nausea or vomiting Anesthetic complications: no     Last Vitals:  Vitals:   03/17/18 1220 03/17/18 1230  BP: (!) 147/73 140/89  Pulse: (!) 59 62  Resp: 12 15  Temp:    SpO2: 100% 100%    Last Pain:  Vitals:   03/17/18 1230  TempSrc:   PainSc: 0-No pain                 Precious Haws [place]

## 2018-03-19 NOTE — Progress Notes (Signed)
Non-identifying Voicemail.  No message left. 

## 2018-03-21 ENCOUNTER — Telehealth: Payer: Self-pay | Admitting: Urology

## 2018-03-21 NOTE — Telephone Encounter (Signed)
Patient's wife, Trai Ells [phone]), called the office today requesting samples of Myrbetriq 50mg .    1 box (4 sleeves) of Myrbetriq 50mg  (Lot # [id number]) placed at the front desk for patient to pick up.

## 2018-03-24 ENCOUNTER — Telehealth: Payer: Self-pay | Admitting: Gastroenterology

## 2018-03-24 ENCOUNTER — Telehealth: Payer: Self-pay

## 2018-03-24 ENCOUNTER — Other Ambulatory Visit: Payer: Self-pay

## 2018-03-24 ENCOUNTER — Other Ambulatory Visit
Admission: RE | Admit: 2018-03-24 | Discharge: 2018-03-24 | Disposition: A | Discharge status: Home / Self Care | Payer: Medicare Other | Source: Ambulatory Visit | Attending: Gastroenterology | Admitting: Gastroenterology

## 2018-03-24 ENCOUNTER — Inpatient Hospital Stay
Admission: EM | Admit: 2018-03-24 | Discharge: 2018-03-29 | DRG: 372 | Disposition: A | Discharge status: Home / Self Care | Payer: Medicare Other | Attending: Internal Medicine | Admitting: Internal Medicine

## 2018-03-24 DIAGNOSIS — R197 Diarrhea, unspecified: Secondary | ICD-10-CM

## 2018-03-24 DIAGNOSIS — Z882 Allergy status to sulfonamides status: Secondary | ICD-10-CM

## 2018-03-24 DIAGNOSIS — I4892 Unspecified atrial flutter: Secondary | ICD-10-CM | POA: Diagnosis present

## 2018-03-24 DIAGNOSIS — E871 Hypo-osmolality and hyponatremia: Secondary | ICD-10-CM | POA: Diagnosis present

## 2018-03-24 DIAGNOSIS — E119 Type 2 diabetes mellitus without complications: Secondary | ICD-10-CM | POA: Diagnosis present

## 2018-03-24 DIAGNOSIS — Z8572 Personal history of non-Hodgkin lymphomas: Secondary | ICD-10-CM | POA: Diagnosis not present

## 2018-03-24 DIAGNOSIS — Z880 Allergy status to penicillin: Secondary | ICD-10-CM

## 2018-03-24 DIAGNOSIS — E785 Hyperlipidemia, unspecified: Secondary | ICD-10-CM | POA: Diagnosis present

## 2018-03-24 DIAGNOSIS — Z79899 Other long term (current) drug therapy: Secondary | ICD-10-CM

## 2018-03-24 DIAGNOSIS — T373X5A Adverse effect of other antiprotozoal drugs, initial encounter: Secondary | ICD-10-CM | POA: Diagnosis not present

## 2018-03-24 DIAGNOSIS — I10 Essential (primary) hypertension: Secondary | ICD-10-CM | POA: Diagnosis present

## 2018-03-24 DIAGNOSIS — Z9841 Cataract extraction status, right eye: Secondary | ICD-10-CM

## 2018-03-24 DIAGNOSIS — A0471 Enterocolitis due to Clostridium difficile, recurrent: Principal | ICD-10-CM | POA: Diagnosis present

## 2018-03-24 DIAGNOSIS — Z9842 Cataract extraction status, left eye: Secondary | ICD-10-CM | POA: Diagnosis not present

## 2018-03-24 DIAGNOSIS — R220 Localized swelling, mass and lump, head: Secondary | ICD-10-CM | POA: Diagnosis not present

## 2018-03-24 DIAGNOSIS — G4730 Sleep apnea, unspecified: Secondary | ICD-10-CM | POA: Diagnosis present

## 2018-03-24 DIAGNOSIS — Z9489 Other transplanted organ and tissue status: Secondary | ICD-10-CM | POA: Diagnosis not present

## 2018-03-24 DIAGNOSIS — Z794 Long term (current) use of insulin: Secondary | ICD-10-CM

## 2018-03-24 DIAGNOSIS — G250 Essential tremor: Secondary | ICD-10-CM | POA: Diagnosis present

## 2018-03-24 DIAGNOSIS — Z8249 Family history of ischemic heart disease and other diseases of the circulatory system: Secondary | ICD-10-CM

## 2018-03-24 DIAGNOSIS — E876 Hypokalemia: Secondary | ICD-10-CM | POA: Diagnosis present

## 2018-03-24 DIAGNOSIS — Z8042 Family history of malignant neoplasm of prostate: Secondary | ICD-10-CM

## 2018-03-24 DIAGNOSIS — Y92230 Patient room in hospital as the place of occurrence of the external cause: Secondary | ICD-10-CM | POA: Diagnosis not present

## 2018-03-24 DIAGNOSIS — Z881 Allergy status to other antibiotic agents status: Secondary | ICD-10-CM | POA: Diagnosis not present

## 2018-03-24 DIAGNOSIS — R110 Nausea: Secondary | ICD-10-CM | POA: Diagnosis not present

## 2018-03-24 DIAGNOSIS — Z888 Allergy status to other drugs, medicaments and biological substances status: Secondary | ICD-10-CM | POA: Diagnosis not present

## 2018-03-24 DIAGNOSIS — Z883 Allergy status to other anti-infective agents status: Secondary | ICD-10-CM | POA: Diagnosis not present

## 2018-03-24 DIAGNOSIS — Z8673 Personal history of transient ischemic attack (TIA), and cerebral infarction without residual deficits: Secondary | ICD-10-CM | POA: Diagnosis not present

## 2018-03-24 DIAGNOSIS — A0472 Enterocolitis due to Clostridium difficile, not specified as recurrent: Secondary | ICD-10-CM | POA: Diagnosis not present

## 2018-03-24 LAB — GASTROINTESTINAL PANEL BY PCR, STOOL (REPLACES STOOL CULTURE)

## 2018-03-24 LAB — CBC
HCT: 34.3 % — ABNORMAL LOW (ref 40.0–52.0)
Hemoglobin: 12 g/dL — ABNORMAL LOW (ref 13.0–18.0)
MCH: 34.6 pg — ABNORMAL HIGH (ref 26.0–34.0)
MCHC: 34.9 g/dL (ref 32.0–36.0)
MCV: 99.1 fL (ref 80.0–100.0)
Platelets: 122 10*3/uL — ABNORMAL LOW (ref 150–440)
RBC: 3.47 MIL/uL — ABNORMAL LOW (ref 4.40–5.90)
RDW: 14.8 % — ABNORMAL HIGH (ref 11.5–14.5)
WBC: 8.9 10*3/uL (ref 3.8–10.6)

## 2018-03-24 LAB — C DIFFICILE QUICK SCREEN W PCR REFLEX
C Diff antigen: POSITIVE — AB
C Diff interpretation: DETECTED
C Diff toxin: POSITIVE — AB

## 2018-03-24 MED ORDER — VANCOMYCIN 50 MG/ML ORAL SOLUTION
125.0000 mg | Freq: Four times a day (QID) | ORAL | Status: DC
  Administered 2018-03-25 – 2018-03-27 (×10): 125 mg via ORAL
  Filled 2018-03-24 (×13): qty 2.5

## 2018-03-24 MED ORDER — VANCOMYCIN HCL 1 G IV SOLR
1000.0000 mg | INTRAVENOUS | Status: DC
Start: 2018-03-24 — End: 2018-03-24

## 2018-03-24 MED ORDER — METRONIDAZOLE IN NACL 5-0.79 MG/ML-% IV SOLN
500.0000 mg | Freq: Once | INTRAVENOUS | Status: DC
  Filled 2018-03-24: qty 100

## 2018-03-24 NOTE — Telephone Encounter (Signed)
Check stool c diff pcr

## 2018-03-24 NOTE — Telephone Encounter (Signed)
No probiotics at this time. Try imodium till we get results

## 2018-03-24 NOTE — Telephone Encounter (Signed)
Patients wife contacted office this am.  She states she is concerned that her husband may have a return occurrence of C-Diff.   Fecal transplant done last Friday, and since last night he has been having loose stool,gelatinous.  5 changes of underwear, and ongoing this am.  He feels tired and wiped out.  Please advise.  Thanks [employer]

## 2018-03-24 NOTE — H&P (Signed)
[place] [facility] [place] NAME: Mike Holloway    MR#:  [id number]  DATE OF BIRTH:  09-15-1941  DATE OF ADMISSION:  03/24/2018  PRIMARY CARE PHYSICIAN: [name], MD   REQUESTING/REFERRING PHYSICIAN: Owens Shark, MD  CHIEF COMPLAINT:   Chief Complaint  Patient presents with  . Diarrhea    HISTORY OF PRESENT ILLNESS:  Mike Holloway  is a 77 y.o. male who presents with watery diarrhea.  Patient has had recurring bouts of C. difficile over the last several months.  He just had fecal transplant last week.  His stool improved for a few days, but over the last 36 hours or so his become progressively more diuretic, and today he had several episodes of watery stool with foul smell.  He had a repeat C. difficile test done and it was positive.  His gastroenterologist recommended he come to the hospital for antibiotics, and hospitalist were called for admission  PAST MEDICAL HISTORY:   Past Medical History:  Diagnosis Date  . Arthritis   . Atrial flutter ([place])   . Diabetes mellitus ([place])   . Essential tremor   . Hypercholesteremia   . Hypertension   . Incontinence   . 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 Sleep apnea   . Stroke [facility])      PAST SURGICAL HISTORY:   Past Surgical History:  Procedure Laterality Date  . ABLATION    . APPENDECTOMY    . CATARACT EXTRACTION, BILATERAL    . COLONOSCOPY WITH PROPOFOL N/A 03/17/2018   Procedure: COLONOSCOPY WITH PROPOFOL;  Surgeon: Jonathon Bellows, MD;  Location: [facility] ENDOSCOPY;  Service: Gastroenterology;  Laterality: N/A;  . DEEP BRAIN STIMULATOR PLACEMENT    . 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   Procedure: FECAL TRANSPLANT;  Surgeon: Jonathon Bellows, MD;  Location: [facility] ENDOSCOPY;  Service: Gastroenterology;  Laterality: N/A;  . HEMORRHOID SURGERY    . HERNIA REPAIR    . NASAL SINUS SURGERY    . ORCHIECTOMY    . TONSILLECTOMY       SOCIAL HISTORY:   Social History   Tobacco  Use  . Smoking status: Never Smoker  . Smokeless tobacco: Never Used  Substance Use Topics  . Alcohol use: No    Alcohol/week: 0.0 oz     FAMILY HISTORY:   Family History  Problem Relation Age of Onset  . Hematuria Father   . Prostate cancer Father   . Heart disease Mother   . Kidney disease Neg Hx   . Bladder Cancer Neg Hx      DRUG ALLERGIES:   Allergies  Allergen Reactions  . Clindamycin Diarrhea    Contracted C. Diff x 2  . Ambien  [Zolpidem]     Other reaction(s): Other (See Comments) Disoriented and moody Other reaction(s): Other (See Comments) Disoriented and moody  . Penicillins   . Sulfa Antibiotics     MEDICATIONS AT HOME:   Prior to Admission medications   Medication Sig Start Date End Date Taking? Authorizing Provider  Cholecalciferol (VITAMIN D-1000 MAX ST) 1000 UNITS tablet Take by mouth.    [provider]  diclofenac sodium (VOLTAREN) 1 % GEL Apply 2 g topically 4 (four) times daily.    [provider]  docusate sodium (COLACE) 100 MG capsule Take by mouth.    [provider]  FLUoxetine (PROZAC) 10 MG tablet Take 10 mg by mouth daily.    [provider]  Ibuprofen (RA IBUPROFEN) 200 MG CAPS Take 200 mg by mouth every 6 (six) hours as needed (pain).  03/10/10   [provider]  LACTOBACILLUS PO Take by mouth.    [provider]  loperamide (IMODIUM A-D) 2 MG tablet Take 2 mg every 2 hours for 3 doses for 1 day 03/17/18 04/16/18  Jonathon Bellows, MD  Menthol, Topical Analgesic, 10 % LIQD Apply 1 application topically as needed (pain).     [provider]  methylcellulose (CITRUCEL) oral powder Take by mouth.    [provider]  mirabegron ER (MYRBETRIQ) 50 MG TB24 tablet Take 1 tablet (50 mg total) by mouth daily. 12/11/15   Nickie Retort, MD  montelukast (SINGULAIR) 10 MG tablet Take 10 mg by mouth at bedtime.    [provider]  Polyethylene Glycol 3350 (MIRALAX PO) Take  by mouth.    [provider]  primidone (MYSOLINE) 50 MG tablet Take 50 mg by mouth 4 (four) times daily.     [provider]  Probiotic Product (VSL#3 PO) Take by mouth.    [provider]  propranolol ER (INDERAL LA) 80 MG 24 hr capsule Take 80 mg by mouth 2 (two) times daily.    [provider]  tamsulosin (FLOMAX) 0.4 MG CAPS capsule Take 0.4 mg by mouth daily.    [provider]  Triamcinolone Acetonide (NASACORT AQ NA) Place into the nose.    [provider]  triamterene-hydrochlorothiazide (MAXZIDE-25) 37.5-25 MG tablet Take 1 tablet by mouth daily.    [provider]  vancomycin (VANCOCIN HCL) 125 MG capsule Take 1 capsule (125 mg total) by mouth 4 (four) times daily. 03/09/18   Jonathon Bellows, MD    REVIEW OF SYSTEMS:  Review of Systems  Constitutional: Negative for chills, fever, malaise/fatigue and weight loss.  HENT: Negative for ear pain, hearing loss and tinnitus.   Eyes: Negative for blurred vision, double vision, pain and redness.  Respiratory: Negative for cough, hemoptysis and shortness of breath.   Cardiovascular: Negative for chest pain, palpitations, orthopnea and leg swelling.  Gastrointestinal: Positive for diarrhea. Negative for abdominal pain, constipation, nausea and vomiting.  Genitourinary: Negative for dysuria, frequency and hematuria.  Musculoskeletal: Negative for back pain, joint pain and neck pain.  Skin:       No acne, rash, or lesions  Neurological: Negative for dizziness, tremors, focal weakness and weakness.  Endo/Heme/Allergies: Negative for polydipsia. Does not bruise/bleed easily.  Psychiatric/Behavioral: Negative for depression. The patient is not nervous/anxious and does not have insomnia.      VITAL SIGNS:   Vitals:   03/24/18 2248 03/24/18 2309  BP: 100/61 110/66  Pulse: 81 78  Resp: 20 (!) 24  Temp: 100.3 F (37.9 C) 99.5 F (37.5 C)  TempSrc: Oral Oral  SpO2: 97% 95%   Weight: 88.9 kg (196 lb)    Wt Readings from Last 3 Encounters:  03/24/18 88.9 kg (196 lb)  03/17/18 88.9 kg (196 lb)  03/14/18 89.1 kg (196 lb 6.4 oz)    PHYSICAL EXAMINATION:  Physical Exam  Vitals reviewed. Constitutional: He is oriented to person, place, and time. He appears well-developed and well-nourished. No distress.  HENT:  Head: Normocephalic and atraumatic.  Mouth/Throat: Oropharynx is clear and moist.  Eyes: Pupils are equal, round, and reactive to light. Conjunctivae and EOM are normal. No scleral icterus.  Neck: Normal range of motion. Neck supple. No JVD present. No thyromegaly present.  Cardiovascular: Normal rate,  regular rhythm and intact distal pulses. Exam reveals no gallop and no friction rub.  No murmur heard. Respiratory: Effort normal and breath sounds normal. No respiratory distress. He has no wheezes. He has no rales.  GI: Soft. Bowel sounds are normal. He exhibits no distension. There is no tenderness.  Musculoskeletal: Normal range of motion. He exhibits no edema.  No arthritis, no gout  Lymphadenopathy:    He has no cervical adenopathy.  Neurological: He is alert and oriented to person, place, and time. No cranial nerve deficit.  No dysarthria, no aphasia  Skin: Skin is warm and dry. No rash noted. No erythema.  Psychiatric: He has a normal mood and affect. His behavior is normal. Judgment and thought content normal.    LABORATORY PANEL:   CBC No results for input(s): WBC, HGB, HCT, PLT in the last 168 hours. ------------------------------------------------------------------------------------------------------------------  Chemistries  No results for input(s): NA, K, CL, CO2, GLUCOSE, BUN, CREATININE, CALCIUM, MG, AST, ALT, ALKPHOS, BILITOT in the last 168 hours.  Invalid input(s): GFRCGP ------------------------------------------------------------------------------------------------------------------  Cardiac Enzymes No results for  input(s): TROPONINI in the last 168 hours. ------------------------------------------------------------------------------------------------------------------  RADIOLOGY:  No results found.  EKG:  No orders found for this or any previous visit.  IMPRESSION AND PLAN:  Principal Problem:   Recurrent Clostridium difficile diarrhea -initiate p.o. vancomycin and IV Flagyl.  GI consult, IV fluids for hydration Active Problems:   Atrial flutter, paroxysmal (HCC) -continue home meds   Type 2 diabetes mellitus (HCC) -sliding scale insulin with corresponding glucose checks   Benign essential HTN -continue home medications   HLD (hyperlipidemia) -Home dose antilipid  Chart review performed and case discussed with ED provider. Labs, imaging and/or ECG reviewed by provider and discussed with patient/family. Management plans discussed with the patient and/or family.  DVT PROPHYLAXIS: SubQ lovenox  GI PROPHYLAXIS: None  ADMISSION STATUS: Inpatient  CODE STATUS: Full  TOTAL TIME TAKING CARE OF THIS PATIENT: 45 minutes.   Ikechukwu Cerny [place] 03/24/2018, 11:51 PM  [facility]  Office  [phone]  CC: Primary care physician; [name], MD  Note:  This document was prepared using Dragon voice recognition software and may include unintentional dictation errors.

## 2018-03-24 NOTE — ED Notes (Signed)
Pt's spouse reports pt had fecal transplant 03/17/17 reports tolerated well had normal BM Monday and Tuesday, reports Wednesday consistency changed, reports this afternoon went to [facility] urgent care left sample, PCP called informed pt tested positive for C-diff, pt is calm, reports mild pain in lower abdominal area rates it 2/10

## 2018-03-24 NOTE — Telephone Encounter (Signed)
Advised pts wife that we will check stool for C-Diff.  Placed order to hospital lab.  Wife wants to know if and when should start Probiotics VSL (She has some at home).  And what can be done in the meanwhile?  Thanks [employer]

## 2018-03-24 NOTE — ED Triage Notes (Signed)
Pt in with co diarrhea since last week, had fecal transplant last Friday and saw pmd today. Was called to come in for admission due to pos cdiff results.

## 2018-03-24 NOTE — ED Provider Notes (Signed)
[facility] Emergency Department Provider Note    First MD Initiated Contact with Patient 03/24/18 2318     (approximate)  I have reviewed the triage vital signs and the nursing notes.   HISTORY  Chief Complaint Diarrhea   HPI Mike Holloway is a 77 y.o. male with below list of chronic medical conditions including multiple episodes of recurrent C. difficile status post fecal transplant last Friday return to the emergency department with history of return of diarrhea that is foul-smelling and fever noted at home.  Patient gave a stool sample to his gastroneurologist today and was notified at 5:30 PM that it was indeed positive for C. difficile.  Gastroneurologist recommended return to the emergency department hospital admission for IV Flagyl and p.o. vancomycin.  Patient does admit to mild abdominal discomfort at this time.  Patient afebrile at present with a temperature 99.5.  Patient denies any nausea vomiting.  Patient does admit to diarrhea.   Past Medical History:  Diagnosis Date  . Arthritis   . Atrial flutter ([place])   . Diabetes mellitus ([place])   . Essential tremor   . Hypercholesteremia   . Hypertension   . Incontinence   . 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 Sleep apnea   . Stroke [facility])     Patient Active Problem List   Diagnosis Date Noted  . Contusion of right knee 02/14/2018  . Bradycardia 12/28/2017  . 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 Chronic pain of right knee 08/10/2016  . Right ankle pain 08/10/2016  . Venous insufficiency of both lower extremities 05/13/2016  . 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[place]) 12/11/2015  . 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 Lymphoma, 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[place]) 04/03/2015  . Breathlessness on exertion 11/21/2014  . Breath shortness 11/21/2014  . Arthropathy 11/07/2014  . Atrial flutter ([place]) 11/07/2014  . Type 2 diabetes mellitus ([place]) 11/07/2014  . Benign essential  tremor 11/07/2014  . Benign essential HTN 11/07/2014  . Mixed incontinence 11/07/2014  . Hypercholesterolemia without hypertriglyceridemia 11/07/2014  . Apnea, sleep 11/07/2014  . Controlled type 2 diabetes mellitus without complication ([place]) 87/56/4332  . Essential (primary) hypertension 11/07/2014  . Pure hypercholesterolemia 11/07/2014  . Other abnormalities of gait and mobility 11/02/2011  . Abnormal gait 06/29/2011  . Discoordination 06/29/2011    Past Surgical History:  Procedure Laterality Date  . ABLATION    . APPENDECTOMY    . CATARACT EXTRACTION, BILATERAL    . COLONOSCOPY WITH PROPOFOL N/A 03/17/2018   Procedure: COLONOSCOPY WITH PROPOFOL;  Surgeon: Jonathon Bellows, MD;  Location: [facility] ENDOSCOPY;  Service: Gastroenterology;  Laterality: N/A;  . DEEP BRAIN STIMULATOR PLACEMENT    . 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   Procedure: FECAL TRANSPLANT;  Surgeon: Jonathon Bellows, MD;  Location: [facility] ENDOSCOPY;  Service: Gastroenterology;  Laterality: N/A;  . HEMORRHOID SURGERY    . HERNIA REPAIR    . NASAL SINUS SURGERY    . ORCHIECTOMY    . TONSILLECTOMY      Prior to Admission medications   Medication Sig Start Date End Date Taking? Authorizing Provider  Cholecalciferol (VITAMIN D-1000 MAX ST) 1000 UNITS tablet Take by mouth.    [provider]  diclofenac sodium (VOLTAREN) 1 % GEL Apply 2 g topically 4 (four) times daily.    [provider]  docusate sodium (COLACE) 100 MG capsule Take by mouth.    [provider]  FLUoxetine (PROZAC) 10 MG capsule  03/10/18  [provider]  FLUoxetine (PROZAC) 10 MG tablet Take 10 mg by mouth daily.    [provider]  Ibuprofen (RA IBUPROFEN) 200 MG CAPS Take by mouth. 03/10/10   [provider]  LACTOBACILLUS PO Take by mouth.    [provider]  loperamide (IMODIUM A-D) 2 MG tablet Take 2 mg every 2 hours for 3 doses for 1 day 03/17/18 04/16/18  Jonathon Bellows, MD  Menthol, Topical  Analgesic, 10 % LIQD     [provider]  methylcellulose (CITRUCEL) oral powder Take by mouth.    [provider]  mirabegron ER (MYRBETRIQ) 50 MG TB24 tablet Take 1 tablet (50 mg total) by mouth daily. 12/11/15   Nickie Retort, MD  montelukast (SINGULAIR) 10 MG tablet Take 10 mg by mouth at bedtime.    [provider]  Polyethylene Glycol 3350 (MIRALAX PO) Take by mouth.    [provider]  primidone (MYSOLINE) 50 MG tablet Take 50 mg by mouth 4 (four) times daily.     [provider]  Probiotic Product (VSL#3 PO) Take by mouth.    [provider]  propranolol ER (INDERAL LA) 80 MG 24 hr capsule Take 80 mg by mouth 2 (two) times daily.    [provider]  tamsulosin (FLOMAX) 0.4 MG CAPS capsule Take 0.4 mg by mouth daily.    [provider]  Triamcinolone Acetonide (NASACORT AQ NA) Place into the nose.    [provider]  triamterene-hydrochlorothiazide (MAXZIDE-25) 37.5-25 MG tablet Take 1 tablet by mouth daily.    [provider]  vancomycin (VANCOCIN HCL) 125 MG capsule Take 1 capsule (125 mg total) by mouth 4 (four) times daily. 03/09/18   Jonathon Bellows, MD    Allergies Clindamycin; Ambien  [zolpidem]; Penicillins; and Sulfa antibiotics  Family History  Problem Relation Age of Onset  . Hematuria Father   . Prostate cancer Father   . Heart disease Mother   . Kidney disease Neg Hx   . Bladder Cancer Neg Hx     Social History Social History   Tobacco Use  . Smoking status: Never Smoker  . Smokeless tobacco: Never Used  Substance Use Topics  . Alcohol use: No    Alcohol/week: 0.0 oz  . Drug use: No    Review of Systems Constitutional: Positive for fever Eyes: No visual changes. ENT: No sore throat. Cardiovascular: Denies chest pain. Respiratory: Denies shortness of breath. Gastrointestinal: No abdominal pain.  No nausea, no vomiting.  Positive for diarrhea Genitourinary: Negative  for dysuria. Musculoskeletal: Negative for neck pain.  Negative for back pain. Integumentary: Negative for rash. Neurological: Negative for headaches, focal weakness or numbness.   ____________________________________________   PHYSICAL EXAM:  VITAL SIGNS: ED Triage Vitals [03/24/18 2248]  Enc Vitals Group     BP 100/61     Pulse Rate 81     Resp 20     Temp 100.3 F (37.9 C)     Temp Source Oral     SpO2 97 %     Weight 88.9 kg (196 lb)     Height      Head Circumference      Peak Flow      Pain Score 2     Pain Loc      Pain Edu?      Excl. in [place]?     Constitutional: Alert and oriented. Well appearing and in no acute distress. Eyes: Conjunctivae are normal.  Head: Atraumatic. Mouth/Throat: Mucous membranes are moist.  Oropharynx non-erythematous. Neck: No stridor.   Cardiovascular: Normal rate, regular rhythm. Good peripheral circulation. Grossly normal heart sounds. Respiratory: Normal respiratory effort.  No retractions. Lungs CTAB. Gastrointestinal: Soft and nontender. No distention.  Musculoskeletal: No lower extremity tenderness nor edema. No gross deformities of extremities. Neurologic:  Normal speech and language. No gross focal neurologic deficits are appreciated.  Skin:  Skin is warm, dry and intact. No rash noted. Psychiatric: Mood and affect are normal. Speech and behavior are normal.      Procedures   ____________________________________________   INITIAL IMPRESSION / ASSESSMENT AND PLAN / ED COURSE  As part of my medical decision making, I reviewed the following data within the electronic MEDICAL RECORD NUMBER   77 year old male presented with above-stated history and physical exam secondary to recurrent C. difficile.  Patient given IV Flagyl 500 mg as well as vancomycin 1 g by mouth.  Patient discussed with staff for admission for further evaluation and management. ____________________________________________  FINAL CLINICAL IMPRESSION(S) / ED  DIAGNOSES  Final diagnoses:  Recurrent Clostridium difficile diarrhea     MEDICATIONS GIVEN DURING THIS VISIT:  Medications - No data to display   ED Discharge Orders    None       Note:  This document was prepared using Dragon voice recognition software and may include unintentional dictation errors.    Gregor Hams, MD 03/24/18 2325

## 2018-03-24 NOTE — Telephone Encounter (Signed)
Mike Holloway has been advised not to start probiotics at this time. We need to get results back first, but in the meantime she can try imodium.  Thanks [employer]

## 2018-03-24 NOTE — Telephone Encounter (Signed)
Lab called me because patient's C. Diff is positive. I have been on the phone with the patient's wife and pharmacy this evening. Initially patient was doing well and did not have abdominal pain or fever and was hydrating well so plan was to treat him as an outpatient. But, patient's wife contacted me again and patient had a loose BM with blood streaks and temp of a 100. No abdominal pain. Wife states he is otherwise not dizzy and appears stable. She feels she can safely drive him to the ER. I have asked to call the ambulance if his clinical status changes or he worsens. She verbalized understanding.   I have asked them to go to the ER. If he has fulminant colitis based on labs and clinical exam after he is evaluated by ER, Oral Vanco, rectal vanco, and IV flagyl would be the treatment for him.  If he does not have fulminant colitis, he still needs C. Diff treatment. If Difcid is available as an inpatient that is an option for this patient since he recently had vanco taper. If Difcid is not available Oral Vanco with our without IV flagyl can be used depending on clinical findings.  I spoke to Dr. Archie Balboa in the ER and informed him of the patient coming to the ER as well.

## 2018-03-25 ENCOUNTER — Encounter: Payer: Self-pay | Admitting: *Deleted

## 2018-03-25 DIAGNOSIS — A0472 Enterocolitis due to Clostridium difficile, not specified as recurrent: Secondary | ICD-10-CM

## 2018-03-25 LAB — COMPREHENSIVE METABOLIC PANEL
ALT: 16 U/L (ref 0–44)
AST: 23 U/L (ref 15–41)
Albumin: 3.3 g/dL — ABNORMAL LOW (ref 3.5–5.0)
Alkaline Phosphatase: 72 U/L (ref 38–126)
Anion gap: 7 (ref 5–15)
BUN: 22 mg/dL (ref 8–23)
CO2: 21 mmol/L — ABNORMAL LOW (ref 22–32)
Calcium: 9 mg/dL (ref 8.9–10.3)
Chloride: 102 mmol/L (ref 98–111)
Creatinine, Ser: 1.25 mg/dL — ABNORMAL HIGH (ref 0.61–1.24)
GFR calc Af Amer: >60 mL/min (ref >60)
GFR calc non Af Amer: 54 mL/min — ABNORMAL LOW (ref >60)
Glucose, Bld: 190 mg/dL — ABNORMAL HIGH (ref 70–99)
Potassium: 3.5 mmol/L (ref 3.5–5.1)
Sodium: 130 mmol/L — ABNORMAL LOW (ref 135–145)
Total Bilirubin: 0.6 mg/dL (ref 0.3–1.2)
Total Protein: 7.2 g/dL (ref 6.5–8.1)

## 2018-03-25 LAB — BASIC METABOLIC PANEL
Anion gap: 8 (ref 5–15)
BUN: 20 mg/dL (ref 8–23)
CO2: 22 mmol/L (ref 22–32)
Calcium: 8.8 mg/dL — ABNORMAL LOW (ref 8.9–10.3)
Chloride: 102 mmol/L (ref 98–111)
Creatinine, Ser: 1.28 mg/dL — ABNORMAL HIGH (ref 0.61–1.24)
GFR calc Af Amer: >60 mL/min (ref >60)
GFR calc non Af Amer: 53 mL/min — ABNORMAL LOW (ref >60)
Glucose, Bld: 130 mg/dL — ABNORMAL HIGH (ref 70–99)
Potassium: 3.2 mmol/L — ABNORMAL LOW (ref 3.5–5.1)
Sodium: 132 mmol/L — ABNORMAL LOW (ref 135–145)

## 2018-03-25 LAB — GLUCOSE, CAPILLARY
Glucose-Capillary: 101 mg/dL — ABNORMAL HIGH (ref 70–99)
Glucose-Capillary: 123 mg/dL — ABNORMAL HIGH (ref 70–99)
Glucose-Capillary: 148 mg/dL — ABNORMAL HIGH (ref 70–99)
Glucose-Capillary: 110 mg/dL — ABNORMAL HIGH (ref 70–99)
Glucose-Capillary: 166 mg/dL — ABNORMAL HIGH (ref 70–99)

## 2018-03-25 LAB — CBC
HCT: 32.1 % — ABNORMAL LOW (ref 40.0–52.0)
Hemoglobin: 11.2 g/dL — ABNORMAL LOW (ref 13.0–18.0)
MCH: 34.4 pg — ABNORMAL HIGH (ref 26.0–34.0)
MCHC: 34.9 g/dL (ref 32.0–36.0)
MCV: 98.5 fL (ref 80.0–100.0)
Platelets: 118 10*3/uL — ABNORMAL LOW (ref 150–440)
RBC: 3.26 MIL/uL — ABNORMAL LOW (ref 4.40–5.90)
RDW: 14.6 % — ABNORMAL HIGH (ref 11.5–14.5)
WBC: 7.2 10*3/uL (ref 3.8–10.6)

## 2018-03-25 LAB — MAGNESIUM: Magnesium: 1.9 mg/dL (ref 1.7–2.4)

## 2018-03-25 MED ORDER — ENOXAPARIN SODIUM 40 MG/0.4ML ~~LOC~~ SOLN
40.0000 mg | SUBCUTANEOUS | Status: DC
  Administered 2018-03-26: 40 mg via SUBCUTANEOUS
  Filled 2018-03-25 (×4): qty 0.4

## 2018-03-25 MED ORDER — MONTELUKAST SODIUM 10 MG PO TABS
10.0000 mg | ORAL_TABLET | Freq: Every day | ORAL | Status: DC
  Administered 2018-03-25 – 2018-03-28 (×4): 10 mg via ORAL
  Filled 2018-03-25 (×5): qty 1

## 2018-03-25 MED ORDER — SODIUM CHLORIDE 0.9 % IV SOLN
INTRAVENOUS | Status: DC
  Administered 2018-03-25: 01:00:00 via INTRAVENOUS

## 2018-03-25 MED ORDER — ONDANSETRON HCL 4 MG PO TABS: 4.0000 mg | ORAL_TABLET | Freq: Four times a day (QID) | ORAL | Status: DC | PRN

## 2018-03-25 MED ORDER — PRIMIDONE 50 MG PO TABS
50.0000 mg | ORAL_TABLET | Freq: Four times a day (QID) | ORAL | Status: DC
  Filled 2018-03-25: qty 1

## 2018-03-25 MED ORDER — ACETAMINOPHEN 325 MG PO TABS: 650.0000 mg | ORAL_TABLET | Freq: Four times a day (QID) | ORAL | Status: DC | PRN

## 2018-03-25 MED ORDER — POTASSIUM CHLORIDE IN NACL 40-0.9 MEQ/L-% IV SOLN
INTRAVENOUS | Status: DC
  Administered 2018-03-25 – 2018-03-26 (×2): 100 mL/h via INTRAVENOUS
  Administered 2018-03-26 – 2018-03-28 (×3): 50 mL/h via INTRAVENOUS
  Filled 2018-03-25 (×9): qty 1000

## 2018-03-25 MED ORDER — PRIMIDONE 50 MG PO TABS
50.0000 mg | ORAL_TABLET | Freq: Every day | ORAL | Status: DC
  Administered 2018-03-25 – 2018-03-28 (×4): 50 mg via ORAL
  Filled 2018-03-25 (×6): qty 1

## 2018-03-25 MED ORDER — INSULIN ASPART 100 UNIT/ML ~~LOC~~ SOLN
0.0000 [IU] | Freq: Three times a day (TID) | SUBCUTANEOUS | Status: DC
  Administered 2018-03-25 (×2): 1 [IU] via SUBCUTANEOUS
  Administered 2018-03-27: 2 [IU] via SUBCUTANEOUS
  Filled 2018-03-25 (×3): qty 1

## 2018-03-25 MED ORDER — INSULIN ASPART 100 UNIT/ML ~~LOC~~ SOLN
0.0000 [IU] | Freq: Every day | SUBCUTANEOUS | Status: DC
  Administered 2018-03-27: 2 [IU] via SUBCUTANEOUS
  Filled 2018-03-25: qty 1

## 2018-03-25 MED ORDER — ONDANSETRON HCL 4 MG/2ML IJ SOLN
4.0000 mg | Freq: Four times a day (QID) | INTRAMUSCULAR | Status: DC | PRN
  Administered 2018-03-27: 4 mg via INTRAVENOUS
  Filled 2018-03-25: qty 2

## 2018-03-25 MED ORDER — PROPRANOLOL HCL ER 80 MG PO CP24
80.0000 mg | ORAL_CAPSULE | Freq: Two times a day (BID) | ORAL | Status: DC
  Administered 2018-03-27 – 2018-03-29 (×3): 80 mg via ORAL
  Filled 2018-03-25 (×10): qty 1

## 2018-03-25 MED ORDER — MIRABEGRON ER 50 MG PO TB24
50.0000 mg | ORAL_TABLET | Freq: Every day | ORAL | Status: DC
  Administered 2018-03-25 – 2018-03-29 (×5): 50 mg via ORAL
  Filled 2018-03-25 (×5): qty 1

## 2018-03-25 MED ORDER — FLUOXETINE HCL 10 MG PO CAPS
10.0000 mg | ORAL_CAPSULE | Freq: Every day | ORAL | Status: DC
  Administered 2018-03-25 – 2018-03-29 (×5): 10 mg via ORAL
  Filled 2018-03-25 (×5): qty 1

## 2018-03-25 MED ORDER — PROPRANOLOL HCL ER 80 MG PO CP24
80.0000 mg | ORAL_CAPSULE | Freq: Two times a day (BID) | ORAL | Status: DC
  Filled 2018-03-25 (×3): qty 1

## 2018-03-25 MED ORDER — METRONIDAZOLE IN NACL 5-0.79 MG/ML-% IV SOLN
500.0000 mg | Freq: Three times a day (TID) | INTRAVENOUS | Status: DC
  Administered 2018-03-25 – 2018-03-27 (×7): 500 mg via INTRAVENOUS
  Filled 2018-03-25 (×10): qty 100

## 2018-03-25 MED ORDER — ACETAMINOPHEN 650 MG RE SUPP: 650.0000 mg | Freq: Four times a day (QID) | RECTAL | Status: DC | PRN

## 2018-03-25 MED ORDER — TAMSULOSIN HCL 0.4 MG PO CAPS
0.4000 mg | ORAL_CAPSULE | Freq: Every day | ORAL | Status: DC
  Administered 2018-03-25 – 2018-03-29 (×5): 0.4 mg via ORAL
  Filled 2018-03-25 (×5): qty 1

## 2018-03-25 MED ORDER — PRIMIDONE 50 MG PO TABS
100.0000 mg | ORAL_TABLET | Freq: Every morning | ORAL | Status: DC
  Administered 2018-03-25 – 2018-03-29 (×5): 100 mg via ORAL
  Filled 2018-03-25 (×5): qty 2

## 2018-03-25 MED ORDER — VANCOMYCIN HCL 125 MG PO CAPS: 125.0000 mg | ORAL_CAPSULE | Freq: Four times a day (QID) | ORAL | Status: DC

## 2018-03-25 NOTE — Consult Note (Signed)
Vonda Antigua, MD [address], [place], [place], [place], [address] [address], [place], [place], [place], [address] Phone: [phone]  Fax: [phone]  Consultation  Referring Provider:     Dr. Bridgett Larsson Primary Care Physician:  [name], MD Reason for Consultation:     Recurrent C. difficile  Date of Admission:  03/24/2018 Date of Consultation:  03/25/2018         HPI:   Mike Holloway is a 77 y.o. male with history of recurrent C. difficile, status post stool transplant 1 week ago, admitted with recurrent diarrhea, and stool testing positive for C. difficile toxin and antigen yesterday.  Over the last 2 days at home, patient has been having multiple loose to watery stools at home, which led to the C. difficile testing as an outpatient.  Symptoms worsened yesterday, and patient had blood streaks in stool, and  low-grade temperature at 100 as per the patient's wife, and they were advised to come to the ER.  He sees Dr. Allen Norris as an outpatient due to history of C. difficile, and recently had stool transplant by Dr. Vicente Males 1 week ago.  He recently completed a vancomycin taper as well.  See their clinic notes for further details.  No recent antibiotic use.  GI panel testing was negative for any other infectious organisms.  Past Medical History:  Diagnosis Date  . Arthritis   . Atrial flutter ([place])   . Diabetes mellitus ([place])   . Essential tremor   . Hypercholesteremia   . Hypertension   . Incontinence   . 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 Sleep apnea   . Stroke [facility])     Past Surgical History:  Procedure Laterality Date  . ABLATION    . APPENDECTOMY    . CATARACT EXTRACTION, BILATERAL    . COLONOSCOPY WITH PROPOFOL N/A 03/17/2018   Procedure: COLONOSCOPY WITH PROPOFOL;  Surgeon: Jonathon Bellows, MD;  Location: [facility] ENDOSCOPY;  Service: Gastroenterology;  Laterality: N/A;  . DEEP BRAIN STIMULATOR PLACEMENT    . 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 HEMORRHOID SURGERY    . HERNIA REPAIR    . NASAL SINUS SURGERY    . ORCHIECTOMY    . TONSILLECTOMY      Prior to Admission medications   Medication Sig Start Date End Date Taking? Authorizing Provider  acidophilus (RISAQUAD) CAPS capsule Take 1 capsule by mouth daily.   Yes [provider]  Cholecalciferol (VITAMIN D-1000 MAX ST) 1000 UNITS tablet Take 1,000 Units by mouth at bedtime.    Yes [provider]  diclofenac sodium (VOLTAREN) 1 % GEL Apply 2 g topically 2 (two) times daily as needed (pain).    Yes [provider]  docusate sodium (COLACE) 100 MG capsule Take 200 mg by mouth daily.    Yes [provider]  famotidine (PEPCID) 20 MG tablet Take 20 mg by mouth 2 (two) times daily.   Yes [provider]  FLUoxetine (PROZAC) 10 MG tablet Take 10 mg by mouth daily.   Yes [provider]  methylcellulose (CITRUCEL) oral powder Take 1 packet by mouth daily.    Yes [provider]  mirabegron ER (MYRBETRIQ) 50 MG TB24 tablet Take 1 tablet (50 mg total) by mouth daily. 12/11/15  Yes Nickie Retort, MD  montelukast Laurine Blazer)  10 MG tablet Take 10 mg by mouth at bedtime.   Yes [provider]  Polyethylene Glycol 3350 (MIRALAX PO) Take 17 g by mouth daily.    Yes [provider]  primidone (MYSOLINE) 50 MG tablet Take 50-100 mg by mouth 2 (two) times daily. 100 mg in the morning and 50 mg at bedtime   Yes [provider]  Probiotic Product (VSL#3 PO) Take 1 capsule by mouth 2 (two) times daily.    Yes [provider]  propranolol ER (INDERAL LA) 80 MG 24 hr capsule Take 160 mg by mouth daily.    Yes [provider]  tamsulosin (FLOMAX) 0.4 MG CAPS capsule Take 0.4 mg by mouth daily.   Yes [provider]  Triamcinolone Acetonide (NASACORT AQ  NA) Place 2 sprays into the nose daily.    Yes [provider]  triamterene-hydrochlorothiazide (MAXZIDE-25) 37.5-25 MG tablet Take 1 tablet by mouth daily.   Yes [provider]    Family History  Problem Relation Age of Onset  . Hematuria Father   . Prostate cancer Father   . Heart disease Mother   . Kidney disease Neg Hx   . Bladder Cancer Neg Hx      Social History   Tobacco Use  . Smoking status: Never Smoker  . Smokeless tobacco: Never Used  Substance Use Topics  . Alcohol use: No    Alcohol/week: 0.0 oz  . Drug use: No    Allergies as of 03/24/2018 - Review Complete 03/24/2018  Allergen Reaction Noted  . Clindamycin Diarrhea 12/11/2015  . Ambien  [zolpidem]  06/10/2015  . Penicillins  02/12/2015  . Sulfa antibiotics  02/12/2015    Review of Systems:    All systems reviewed and negative except where noted in HPI.   Physical Exam:  Vital signs in last 24 hours: Vitals:   03/24/18 2309 03/25/18 0027 03/25/18 0111 03/25/18 1021  BP: 110/66 108/60 136/77 (!) 95/47  Pulse: 78 71 70 71  Resp: (!) 24 16 20    Temp: 99.5 F (37.5 C) 98.6 F (37 C) (!) 97.4 F (36.3 C)   TempSrc: Oral Oral Oral   SpO2: 95% 95% 99%   Weight:   194 lb 9.6 oz (88.3 kg)   Height:   5\' 8"  (1.727 m)    Last BM Date: 03/25/18 General:   Pleasant, cooperative in NAD Head:  Normocephalic and atraumatic. Eyes:   No icterus.   Conjunctiva pink. PERRLA. Ears:  Normal auditory acuity. Neck:  Supple; no masses or thyroidomegaly Lungs: Respirations even and unlabored. Lungs clear to auscultation bilaterally.   No wheezes, crackles, or rhonchi.  Abdomen:  Soft, nondistended, nontender. Normal bowel sounds. No appreciable masses or hepatomegaly.  No rebound or guarding.  Neurologic:  Alert and oriented x3;  grossly normal neurologically. Skin:  Intact without significant lesions or rashes. Cervical Nodes:  No significant cervical adenopathy. Psych:  Alert and cooperative.  Normal affect.  LAB RESULTS: Recent Labs    03/24/18 2335 03/25/18 0722  WBC 8.9 7.2  HGB 12.0* 11.2*  HCT 34.3* 32.1*  PLT 122* 118*   BMET Recent Labs    03/24/18 2335 03/25/18 0722  NA 130* 132*  K 3.5 3.2*  CL 102 102  CO2 21* 22  GLUCOSE 190* 130*  BUN 22 20  CREATININE 1.25* 1.28*  CALCIUM 9.0 8.8*   LFT Recent Labs    03/24/18 2335  PROT 7.2  ALBUMIN 3.3*  AST 23  ALT 16  ALKPHOS 72  BILITOT 0.6   PT/INR No results for input(s): LABPROT, INR in the last 72 hours.  STUDIES: No results found.    Impression / Plan:   Mike Holloway is a 77 y.o. y/o male with recurrent C. difficile, status post stool transplant 1 week ago, with worsening diarrhea over the last 2 days at home, and C. difficile antigen and toxin positive on testing yesterday  Due to ongoing diarrhea despite stool transplant, and recent vancomycin taper, and low-grade temperature at home, dehydration, inpatient treatment is indicated Fidaxomicin was attempted to be prescribed as an outpatient, but his pharmacy did not have it Continue oral vancomycin Continue IV Flagyl No evidence of fulminant colitis at this time Stool are more formed and frequency of BMs have improved since admission which is reassuring If abdominal pain worsens, or diarrhea worsens, would also consider rectal vancomycin No signs of toxic megacolon at this time If abdominal pain occurs obtain Stat Abdominal Xray at that time. No abdominal pain at this time. Tolerating PO diet.   Given repeated episodes of recurrent C. difficile, would recommend an infectious disease consult for their expert opinion  Thank you for involving me in the care of this patient.      LOS: 1 day   Virgel Manifold, MD  03/25/2018, 12:08 PM

## 2018-03-25 NOTE — ED Notes (Signed)
Dr. Willis at bedside  

## 2018-03-25 NOTE — Progress Notes (Signed)
[facility] NAME: Mike Holloway    MR#:  [id number]  DATE OF BIRTH:  04-17-1941  SUBJECTIVE:  CHIEF COMPLAINT:   Chief Complaint  Patient presents with  . Diarrhea   Diarrhea but more formed stool. REVIEW OF SYSTEMS:  Review of Systems  Constitutional: Negative for chills, fever and malaise/fatigue.  HENT: Negative for sore throat.   Eyes: Negative for blurred vision and double vision.  Respiratory: Negative for cough, hemoptysis, shortness of breath, wheezing and stridor.   Cardiovascular: Negative for chest pain, palpitations, orthopnea and leg swelling.  Gastrointestinal: Positive for diarrhea. Negative for abdominal pain, blood in stool, melena, nausea and vomiting.  Genitourinary: Negative for dysuria, flank pain and hematuria.  Musculoskeletal: Negative for back pain and joint pain.  Skin: Negative for rash.  Neurological: Negative for dizziness, sensory change, focal weakness, seizures, loss of consciousness, weakness and headaches.  Endo/Heme/Allergies: Negative for polydipsia.  Psychiatric/Behavioral: Negative for depression. The patient is not nervous/anxious.     DRUG ALLERGIES:   Allergies  Allergen Reactions  . Clindamycin Diarrhea    Contracted C. Diff x 2  . Ambien  [Zolpidem]     Other reaction(s): Other (See Comments) Disoriented and moody Other reaction(s): Other (See Comments) Disoriented and moody  . Penicillins Rash    Has patient had a PCN reaction causing immediate rash, facial/tongue/throat swelling, SOB or lightheadedness with hypotension: Unknown Has patient had a PCN reaction causing severe rash involving mucus membranes or skin necrosis: Unknown Has patient had a PCN reaction that required hospitalization: No Has patient had a PCN reaction occurring within the last 10 years: No If all of the above answers are "NO", then may proceed with Cephalosporin use.  . Sulfa Antibiotics Rash    VITALS:  Blood pressure 122/64, pulse 69, temperature 98.2 F (36.8 C), temperature source Oral, resp. rate 18, height 5\' 8"  (1.727 m), weight 194 lb 9.6 oz (88.3 kg), SpO2 97 %. PHYSICAL EXAMINATION:  Physical Exam  Constitutional: He is oriented to person, place, and time. He appears well-developed.  HENT:  Head: Normocephalic.  Mouth/Throat: Oropharynx is clear and moist.  Eyes: Pupils are equal, round, and reactive to light. Conjunctivae and EOM are normal. No scleral icterus.  Neck: Normal range of motion. Neck supple. No JVD present. No tracheal deviation present.  Cardiovascular: Normal rate, regular rhythm and normal heart sounds. Exam reveals no gallop.  No murmur heard. Pulmonary/Chest: Effort normal and breath sounds normal. No respiratory distress. He has no wheezes. He has no rales.  Abdominal: Soft. Bowel sounds are normal. He exhibits no distension. There is no tenderness. There is no rebound.  Musculoskeletal: Normal range of motion. He exhibits no edema or tenderness.  Neurological: He is alert and oriented to person, place, and time. No cranial nerve deficit.  Skin: No rash noted. No erythema.  Psychiatric: He has a normal mood and affect.   LABORATORY PANEL:  Male CBC Recent Labs  Lab 03/25/18 0722  WBC 7.2  HGB 11.2*  HCT 32.1*  PLT 118*   ------------------------------------------------------------------------------------------------------------------ Chemistries  Recent Labs  Lab 03/24/18 2335 03/25/18 0722  NA 130* 132*  K 3.5 3.2*  CL 102 102  CO2 21* 22  GLUCOSE 190* 130*  BUN 22 20  CREATININE 1.25* 1.28*  CALCIUM 9.0 8.8*  MG  --  1.9  AST 23  --   ALT 16  --   ALKPHOS 72  --   BILITOT  0.6  --    RADIOLOGY:  No results found. ASSESSMENT AND PLAN:   Recurrent Clostridium difficile diarrhea Continue p.o. vancomycin and IV Flagyl. IV fluids for hydration  Hypokalemia.  Give potassium supplement IV normal saline with potassium.   Follow-up level.  Hyponatremia.  Continue normal saline  With KCl IV.  Follow up sodium level.    Atrial flutter, paroxysmal ([place]) -continue home meds   Type 2 diabetes mellitus (HCC) -sliding scale insulin with corresponding glucose checks   Benign essential HTN -continue home medications   HLD (hyperlipidemia) -Home dose antilipid  All the records are reviewed and case discussed with Care Management/Social Worker. Management plans discussed with the patient, his wife and they are in agreement.  CODE STATUS: Full Code  TOTAL TIME TAKING CARE OF THIS PATIENT: 32 minutes.   More than 50% of the time was spent in counseling/coordination of care: YES  POSSIBLE D/C IN 2 DAYS, DEPENDING ON CLINICAL CONDITION.   Demetrios Loll M.D on 03/25/2018 at 1:06 PM  Between 7am to 6pm - Pager - [phone]  After 6pm go to [email or web address] - [facility]

## 2018-03-26 LAB — BASIC METABOLIC PANEL
Anion gap: 6 (ref 5–15)
BUN: 19 mg/dL (ref 8–23)
CO2: 25 mmol/L (ref 22–32)
Calcium: 8.7 mg/dL — ABNORMAL LOW (ref 8.9–10.3)
Chloride: 106 mmol/L (ref 98–111)
Creatinine, Ser: 1.16 mg/dL (ref 0.61–1.24)
GFR calc Af Amer: >60 mL/min (ref >60)
GFR calc non Af Amer: 59 mL/min — ABNORMAL LOW (ref >60)
Glucose, Bld: 107 mg/dL — ABNORMAL HIGH (ref 70–99)
Potassium: 3.9 mmol/L (ref 3.5–5.1)
Sodium: 137 mmol/L (ref 135–145)

## 2018-03-26 LAB — GLUCOSE, CAPILLARY
Glucose-Capillary: 113 mg/dL — ABNORMAL HIGH (ref 70–99)
Glucose-Capillary: 80 mg/dL (ref 70–99)
Glucose-Capillary: 124 mg/dL — ABNORMAL HIGH (ref 70–99)
Glucose-Capillary: 140 mg/dL — ABNORMAL HIGH (ref 70–99)
Glucose-Capillary: 148 mg/dL — ABNORMAL HIGH (ref 70–99)

## 2018-03-26 NOTE — Progress Notes (Signed)
[place] at [place] NAME: Mike Holloway    MR#:  [id number]  DATE OF BIRTH:  1941-06-05  SUBJECTIVE:  CHIEF COMPLAINT:   Chief Complaint  Patient presents with  . Diarrhea   Several times watery diarrhea today.[name] REVIEW OF SYSTEMS:  Review of Systems  Constitutional: Negative for chills, fever and malaise/fatigue.  HENT: Negative for sore throat.   Eyes: Negative for blurred vision and double vision.  Respiratory: Negative for cough, hemoptysis, shortness of breath, wheezing and stridor.   Cardiovascular: Negative for chest pain, palpitations, orthopnea and leg swelling.  Gastrointestinal: Positive for diarrhea. Negative for abdominal pain, blood in stool, melena, nausea and vomiting.  Genitourinary: Negative for dysuria, flank pain and hematuria.  Musculoskeletal: Negative for back pain and joint pain.  Skin: Negative for rash.  Neurological: Negative for dizziness, sensory change, focal weakness, seizures, loss of consciousness, weakness and headaches.  Endo/Heme/Allergies: Negative for polydipsia.  Psychiatric/Behavioral: Negative for depression. The patient is not nervous/anxious.     DRUG ALLERGIES:   Allergies  Allergen Reactions  . Clindamycin Diarrhea    Contracted C. Diff x 2  . Ambien  [Zolpidem]     Other reaction(s): Other (See Comments) Disoriented and moody Other reaction(s): Other (See Comments) Disoriented and moody  . Penicillins Rash    Has patient had a PCN reaction causing immediate rash, facial/tongue/throat swelling, SOB or lightheadedness with hypotension: Unknown Has patient had a PCN reaction causing severe rash involving mucus membranes or skin necrosis: Unknown Has patient had a PCN reaction that required hospitalization: No Has patient had a PCN reaction occurring within the last 10 years: No If all of the above answers are "NO", then may proceed with Cephalosporin use.  . Sulfa Antibiotics Rash    VITALS:  Blood pressure 133/70, pulse 82, temperature 98.7 F (37.1 C), temperature source Oral, resp. rate 16, height 5\' 8"  (1.727 m), weight 194 lb 9.6 oz (88.3 kg), SpO2 98 %. PHYSICAL EXAMINATION:  Physical Exam  Constitutional: He is oriented to person, place, and time. He appears well-developed.  HENT:  Head: Normocephalic.  Mouth/Throat: Oropharynx is clear and moist.  Eyes: Pupils are equal, round, and reactive to light. Conjunctivae and EOM are normal. No scleral icterus.  Neck: Normal range of motion. Neck supple. No JVD present. No tracheal deviation present.  Cardiovascular: Normal rate, regular rhythm and normal heart sounds. Exam reveals no gallop.  No murmur heard. Pulmonary/Chest: Effort normal and breath sounds normal. No respiratory distress. He has no wheezes. He has no rales.  Abdominal: Soft. Bowel sounds are normal. He exhibits no distension. There is no tenderness. There is no rebound.  Musculoskeletal: Normal range of motion. He exhibits no edema or tenderness.  Neurological: He is alert and oriented to person, place, and time. No cranial nerve deficit.  Skin: No rash noted. No erythema.  Psychiatric: He has a normal mood and affect.   LABORATORY PANEL:  Male CBC Recent Labs  Lab 03/25/18 0722  WBC 7.2  HGB 11.2*  HCT 32.1*  PLT 118*   ------------------------------------------------------------------------------------------------------------------ Chemistries  Recent Labs  Lab 03/24/18 2335 03/25/18 0722 03/26/18 0657  NA 130* 132* 137  K 3.5 3.2* 3.9  CL 102 102 106  CO2 21* 22 25  GLUCOSE 190* 130* 107*  BUN 22 20 19   CREATININE 1.25* 1.28* 1.16  CALCIUM 9.0 8.8* 8.7*  MG  --  1.9  --   AST 23  --   --  ALT 16  --   --   ALKPHOS 72  --   --   BILITOT 0.6  --   --    RADIOLOGY:  No results found. ASSESSMENT AND PLAN:   Recurrent Clostridium difficile diarrhea Continue p.o. vancomycin and IV Flagyl. IV fluids for  hydration  Hypokalemia.  Give potassium supplement IV normal saline with potassium.  Improved. Hyponatremia.  Improved with normal saline IV.    Atrial flutter, paroxysmal ([name]) -continue home meds   Type 2 diabetes mellitus (HCC) -sliding scale insulin with corresponding glucose checks   Benign essential HTN -continue home medications   HLD (hyperlipidemia) -Home dose antilipid  All the records are reviewed and case discussed with Care Management/Social Worker. Management plans discussed with the patient, his wife and they are in agreement.  CODE STATUS: Full Code  TOTAL TIME TAKING CARE OF THIS PATIENT: 32 minutes.   More than 50% of the time was spent in counseling/coordination of care: YES  POSSIBLE D/C IN 1-2 DAYS, DEPENDING ON CLINICAL CONDITION.   Mike Holloway M.D on 03/26/2018 at 1:08 PM  Between 7am to 6pm - Pager - [phone]  After 6pm go to [email or web address] - [facility]

## 2018-03-27 DIAGNOSIS — A0471 Enterocolitis due to Clostridium difficile, recurrent: Principal | ICD-10-CM

## 2018-03-27 LAB — BASIC METABOLIC PANEL
Anion gap: 4 — ABNORMAL LOW (ref 5–15)
BUN: 14 mg/dL (ref 8–23)
CO2: 23 mmol/L (ref 22–32)
Calcium: 8.6 mg/dL — ABNORMAL LOW (ref 8.9–10.3)
Chloride: 109 mmol/L (ref 98–111)
Creatinine, Ser: 1.06 mg/dL (ref 0.61–1.24)
GFR calc Af Amer: >60 mL/min (ref >60)
GFR calc non Af Amer: >60 mL/min (ref >60)
Glucose, Bld: 109 mg/dL — ABNORMAL HIGH (ref 70–99)
Potassium: 3.5 mmol/L (ref 3.5–5.1)
Sodium: 136 mmol/L (ref 135–145)

## 2018-03-27 LAB — C DIFFICILE QUICK SCREEN W PCR REFLEX
C Diff antigen: NEGATIVE
C Diff interpretation: NOT DETECTED
C Diff toxin: NEGATIVE

## 2018-03-27 LAB — GLUCOSE, CAPILLARY
Glucose-Capillary: 121 mg/dL — ABNORMAL HIGH (ref 70–99)
Glucose-Capillary: 104 mg/dL — ABNORMAL HIGH (ref 70–99)
Glucose-Capillary: 116 mg/dL — ABNORMAL HIGH (ref 70–99)
Glucose-Capillary: 169 mg/dL — ABNORMAL HIGH (ref 70–99)
Glucose-Capillary: 232 mg/dL — ABNORMAL HIGH (ref 70–99)

## 2018-03-27 LAB — MAGNESIUM: Magnesium: 1.8 mg/dL (ref 1.7–2.4)

## 2018-03-27 MED ORDER — DIPHENHYDRAMINE HCL 25 MG PO CAPS
25.0000 mg | ORAL_CAPSULE | Freq: Four times a day (QID) | ORAL | Status: DC | PRN
  Administered 2018-03-27 (×2): 25 mg via ORAL
  Filled 2018-03-27 (×2): qty 1

## 2018-03-27 MED ORDER — FAMOTIDINE 20 MG PO TABS
20.0000 mg | ORAL_TABLET | Freq: Two times a day (BID) | ORAL | Status: DC
  Administered 2018-03-27 – 2018-03-29 (×5): 20 mg via ORAL
  Filled 2018-03-27 (×5): qty 1

## 2018-03-27 MED ORDER — METHYLPREDNISOLONE SODIUM SUCC 125 MG IJ SOLR
60.0000 mg | INTRAMUSCULAR | Status: DC
  Administered 2018-03-27: 60 mg via INTRAVENOUS
  Filled 2018-03-27: qty 2

## 2018-03-27 MED ORDER — FIDAXOMICIN 200 MG PO TABS
200.0000 mg | ORAL_TABLET | Freq: Two times a day (BID) | ORAL | Status: DC
  Administered 2018-03-27 – 2018-03-29 (×4): 200 mg via ORAL
  Filled 2018-03-27 (×6): qty 1

## 2018-03-27 NOTE — Progress Notes (Addendum)
[facility] at [place] NAME: Mike Holloway    MR#:  [id number]  DATE OF BIRTH:  [birth year]  SUBJECTIVE:  CHIEF COMPLAINT:   Chief Complaint  Patient presents with  . Diarrhea   The patient has shaking and nausea after given Flagyl last night and this morning.  He did not have sleep last night. he is drowsy today.  He had 2 episodes of watery diarrhea.  He did not answer questions due to drowsiness. REVIEW OF SYSTEMS:  Review of Systems  Unable to perform ROS: Medical condition    DRUG ALLERGIES:   Allergies  Allergen Reactions  . Clindamycin Diarrhea    Contracted C. Diff x 2  . Ambien  [Zolpidem]     Other reaction(s): Other (See Comments) Disoriented and moody Other reaction(s): Other (See Comments) Disoriented and moody  . Penicillins Rash    Has patient had a PCN reaction causing immediate rash, facial/tongue/throat swelling, SOB or lightheadedness with hypotension: Unknown Has patient had a PCN reaction causing severe rash involving mucus membranes or skin necrosis: Unknown Has patient had a PCN reaction that required hospitalization: No Has patient had a PCN reaction occurring within the last 10 years: No If all of the above answers are "NO", then may proceed with Cephalosporin use.  . Sulfa Antibiotics Rash   VITALS:  Blood pressure 121/80, pulse 97, temperature 99.1 F (37.3 C), temperature source Oral, resp. rate 12, height 5\' 8"  (1.727 m), weight 194 lb 9.6 oz (88.3 kg), SpO2 97 %. PHYSICAL EXAMINATION:  Physical Exam  Constitutional: He is oriented to person, place, and time. He appears well-developed.  HENT:  Head: Normocephalic.  Mouth/Throat: Oropharynx is clear and moist.  Eyes: Pupils are equal, round, and reactive to light. Conjunctivae and EOM are normal. No scleral icterus.  Neck: Normal range of motion. Neck supple. No JVD present. No tracheal deviation present.  Cardiovascular: Normal rate, regular rhythm  and normal heart sounds. Exam reveals no gallop.  No murmur heard. Pulmonary/Chest: Effort normal and breath sounds normal. No respiratory distress. He has no wheezes. He has no rales.  Abdominal: Soft. Bowel sounds are normal. He exhibits no distension. There is no tenderness. There is no rebound.  Musculoskeletal: Normal range of motion. He exhibits no edema or tenderness.  Neurological: He is alert and oriented to person, place, and time. No cranial nerve deficit.  Skin: No rash noted. No erythema.  Psychiatric: He has a normal mood and affect.   LABORATORY PANEL:  Male CBC Recent Labs  Lab 03/25/18 0722  WBC 7.2  HGB 11.2*  HCT 32.1*  PLT 118*   ------------------------------------------------------------------------------------------------------------------ Chemistries  Recent Labs  Lab 03/24/18 2335  03/27/18 0444  NA 130*   < > 136  K 3.5   < > 3.5  CL 102   < > 109  CO2 21*   < > 23  GLUCOSE 190*   < > 109*  BUN 22   < > 14  CREATININE 1.25*   < > 1.06  CALCIUM 9.0   < > 8.6*  MG  --    < > 1.8  AST 23  --   --   ALT 16  --   --   ALKPHOS 72  --   --   BILITOT 0.6  --   --    < > = values in this interval not displayed.   RADIOLOGY:  No results found. ASSESSMENT AND PLAN:  Recurrent Clostridium difficile diarrhea Start Dificid, dicontinue p.o. vancomycin and IV Flagyl.  Continue IV fluids for hydration. Repeat C. difficile test is negative.  Allergy to Flagyl.  Discontinue Flagyl, start IV Solu-Medrol, Benadryl as needed.  Hypokalemia.  Give potassium supplement IV normal saline with potassium.  Improved. Hyponatremia.  Improved with normal saline IV.    Atrial flutter, paroxysmal ([place]) -continue home meds   Type 2 diabetes mellitus (HCC) -sliding scale insulin with corresponding glucose checks   Benign essential HTN -BP is low side.  Maxzide was discontinued.   HLD (hyperlipidemia) -Home dose antilipid  Discussed with Dr. Vicente Males. All the records are  reviewed and case discussed with Care Management/Social Worker. Management plans discussed with the patient, his wife and they are in agreement.  CODE STATUS: Full Code  TOTAL TIME TAKING CARE OF THIS PATIENT: 38 minutes.   More than 50% of the time was spent in counseling/coordination of care: YES  POSSIBLE D/C IN 2 DAYS, DEPENDING ON CLINICAL CONDITION.   Demetrios Loll M.D on 03/27/2018 at 4:17 PM  Between 7am to 6pm - Pager - [phone]  After 6pm go to [email or web address] - [facility]

## 2018-03-27 NOTE — Progress Notes (Signed)
Pt wife stated that pt's  tongue was somewhat "fatter" or swelling . MD was notified and ordered benadryl and solumedrol. No signs of acute distress. VS stable.

## 2018-03-27 NOTE — Progress Notes (Signed)
Mike Holloway , MD [address] [place], [place], [place], [address] [address], [place], [place], [place], [address] Phone: [phone]  Fax: [phone]   Mike Holloway is being followed for recurrent c diff diarrhea    Subjective: Still having diarrhea but improving, lots of shakes and nausea, no abdominal pain    Objective: Vital signs in last 24 hours: Vitals:   03/27/18 0103 03/27/18 0112 03/27/18 0602 03/27/18 1003  BP:  140/71 (!) 118/58 (!) 90/52  Pulse:  85 80 98  Resp:   18 20  Temp: 98.8 F (37.1 C)  98.7 F (37.1 C) 98.9 F (37.2 C)  TempSrc: Oral  Oral Oral  SpO2:   98% 93%  Weight:      Height:       Weight change:   Intake/Output Summary (Last 24 hours) at 03/27/2018 1032 Last data filed at 03/26/2018 2046 Gross per 24 hour  Intake 220 ml  Output -  Net 220 ml     Exam: Heart:: Regular rate and rhythm, S1S2 present or without murmur or extra heart sounds Lungs: normal, clear to auscultation and clear to auscultation and percussion Abdomen: soft, nontender, normal bowel sounds   Lab Results: @LABTEST2 @ Micro Results: Recent Results (from the past 240 hour(s))  C difficile quick scan w PCR reflex     Status: Abnormal   Collection Time: 03/24/18  3:35 PM  Result Value Ref Range Status   C Diff antigen POSITIVE (A) NEGATIVE Final   C Diff toxin POSITIVE (A) NEGATIVE Final   C Diff interpretation Toxin producing C. difficile detected.  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 Performed at [facility], [address], [place], [place] [address]   Gastrointestinal Panel by PCR , Stool     Status: None   Collection Time: 03/24/18  5:20 PM  Result Value Ref Range Status   Campylobacter species NOT DETECTED NOT DETECTED Final   Plesimonas shigelloides NOT DETECTED NOT DETECTED Final   Salmonella species NOT DETECTED NOT DETECTED Final   Yersinia  enterocolitica NOT DETECTED NOT DETECTED Final   Vibrio species NOT DETECTED NOT DETECTED Final   Vibrio cholerae NOT DETECTED NOT DETECTED Final   Enteroaggregative E coli (EAEC) NOT DETECTED NOT DETECTED Final   Enteropathogenic E coli (EPEC) NOT DETECTED NOT DETECTED Final   Enterotoxigenic E coli (ETEC) NOT DETECTED NOT DETECTED Final   Shiga like toxin producing E coli (STEC) NOT DETECTED NOT DETECTED Final   Shigella/Enteroinvasive E coli (EIEC) NOT DETECTED NOT DETECTED Final   Cryptosporidium NOT DETECTED NOT DETECTED Final   Cyclospora cayetanensis NOT DETECTED NOT DETECTED Final   Entamoeba histolytica NOT DETECTED NOT DETECTED Final   Giardia lamblia NOT DETECTED NOT DETECTED Final   Adenovirus F40/41 NOT DETECTED NOT DETECTED Final   Astrovirus NOT DETECTED NOT DETECTED Final   Norovirus GI/GII NOT DETECTED NOT DETECTED Final   Rotavirus A NOT DETECTED NOT DETECTED Final   Sapovirus (I, II, IV, and V) NOT DETECTED NOT DETECTED Final    Comment: Performed at [facility], [address], [place], [place] [address]   Studies/Results: No results found. Medications: I have reviewed the patient's current medications. Scheduled Meds: . enoxaparin (LOVENOX) injection  40 mg Subcutaneous Q24H  . FLUoxetine  10 mg Oral Daily  . insulin aspart  0-5 Units Subcutaneous QHS  . insulin aspart  0-9 Units Subcutaneous TID WC  . mirabegron ER  50 mg Oral Daily  . montelukast  10 mg Oral QHS  . primidone  100 mg Oral q morning - 10a  . primidone  50 mg Oral QHS  . propranolol ER  80 mg Oral BID  . tamsulosin  0.4 mg Oral Daily  . vancomycin  125 mg Oral Q6H   Continuous Infusions: . 0.9 % NaCl with KCl 40 mEq / L 50 mL/hr (03/26/18 1933)  . metronidazole 500 mg (03/27/18 0959)   PRN Meds:.acetaminophen **OR** acetaminophen, ondansetron **OR** ondansetron (ZOFRAN) IV   Assessment: Principal Problem:   Recurrent Clostridium difficile diarrhea Active Problems:   Atrial  flutter, paroxysmal (HCC)   Type 2 diabetes mellitus (HCC)   Benign essential HTN   HLD (hyperlipidemia)  Mike Holloway 77 y.o. 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 On Flagyl and oral vancomycin   Plan: 1. Options at this time are continue Vancomycin and try to obtain Dificid which has not been tried. Stop Falgyl due to side effects. Will plan to obtain Dificid as an outpatient as well to continue at discharge. I will have my office contact the company .  2. A second round of stool transplant is warranted per the guidelines but  as a whole has put a stop on stool transplants at this time due to a recent FDA alert. We are yet to receive guidance on when it will resume.An option would be to refer to [facility]  If he is no better in 48 hours as they still are doing stool transplants   Plan discussed with wife and Dr Bridgett Larsson    LOS: 3 days   Mike Bellows, MD 03/27/2018, 10:32 AM

## 2018-03-28 LAB — GLUCOSE, CAPILLARY
Glucose-Capillary: 110 mg/dL — ABNORMAL HIGH (ref 70–99)
Glucose-Capillary: 102 mg/dL — ABNORMAL HIGH (ref 70–99)
Glucose-Capillary: 114 mg/dL — ABNORMAL HIGH (ref 70–99)
Glucose-Capillary: 132 mg/dL — ABNORMAL HIGH (ref 70–99)

## 2018-03-28 MED ORDER — METHYLPREDNISOLONE SODIUM SUCC 125 MG IJ SOLR: 60.0000 mg | Freq: Every day | INTRAMUSCULAR | Status: DC

## 2018-03-28 NOTE — Progress Notes (Signed)
[place] at [place] NAME: Mike Holloway    MR#:  [id number]  DATE OF BIRTH:  04-22-1941  SUBJECTIVE:  CHIEF COMPLAINT:   Chief Complaint  Patient presents with  . Diarrhea   The patient is alert, awake and oriented.  He has no complaints. He has only had a little formed stool today.  No diarrhea noticed. REVIEW OF SYSTEMS:  Review of Systems  Constitutional: Negative for chills, fever and malaise/fatigue.  HENT: Negative for sore throat.   Eyes: Negative for blurred vision and double vision.  Respiratory: Negative for cough, hemoptysis, shortness of breath, wheezing and stridor.   Cardiovascular: Negative for chest pain, palpitations, orthopnea and leg swelling.  Gastrointestinal: Negative for abdominal pain, blood in stool, diarrhea, melena, nausea and vomiting.  Genitourinary: Negative for dysuria, flank pain and hematuria.  Musculoskeletal: Negative for back pain and joint pain.  Skin: Negative for rash.  Neurological: Negative for dizziness, sensory change, focal weakness, seizures, loss of consciousness, weakness and headaches.  Endo/Heme/Allergies: Negative for polydipsia.  Psychiatric/Behavioral: Negative for depression. The patient is not nervous/anxious.     DRUG ALLERGIES:   Allergies  Allergen Reactions  . Clindamycin Diarrhea    Contracted C. Diff x 2  . Ambien  [Zolpidem]     Other reaction(s): Other (See Comments) Disoriented and moody Other reaction(s): Other (See Comments) Disoriented and moody  . Penicillins Rash    Has patient had a PCN reaction causing immediate rash, facial/tongue/throat swelling, SOB or lightheadedness with hypotension: Unknown Has patient had a PCN reaction causing severe rash involving mucus membranes or skin necrosis: Unknown Has patient had a PCN reaction that required hospitalization: No Has patient had a PCN reaction occurring within the last 10 years: No If all of the above answers  are "NO", then may proceed with Cephalosporin use.  . Sulfa Antibiotics Rash   VITALS:  Blood pressure 112/66, pulse 64, temperature 98.6 F (37 C), temperature source Oral, resp. rate 16, height 5\' 8"  (1.727 m), weight 199 lb 8.3 oz (90.5 kg), SpO2 97 %. PHYSICAL EXAMINATION:  Physical Exam  Constitutional: He is oriented to person, place, and time. He appears well-developed.  HENT:  Head: Normocephalic.  Mouth/Throat: Oropharynx is clear and moist.  Eyes: Pupils are equal, round, and reactive to light. Conjunctivae and EOM are normal. No scleral icterus.  Neck: Normal range of motion. Neck supple. No JVD present. No tracheal deviation present.  Cardiovascular: Normal rate, regular rhythm and normal heart sounds. Exam reveals no gallop.  No murmur heard. Pulmonary/Chest: Effort normal and breath sounds normal. No respiratory distress. He has no wheezes. He has no rales.  Abdominal: Soft. Bowel sounds are normal. He exhibits no distension. There is no tenderness. There is no rebound.  Musculoskeletal: Normal range of motion. He exhibits no edema or tenderness.  Neurological: He is alert and oriented to person, place, and time. No cranial nerve deficit.  Skin: No rash noted. No erythema.  Psychiatric: He has a normal mood and affect.   LABORATORY PANEL:  Male CBC Recent Labs  Lab 03/25/18 0722  WBC 7.2  HGB 11.2*  HCT 32.1*  PLT 118*   ------------------------------------------------------------------------------------------------------------------ Chemistries  Recent Labs  Lab 03/24/18 2335  03/27/18 0444  NA 130*   < > 136  K 3.5   < > 3.5  CL 102   < > 109  CO2 21*   < > 23  GLUCOSE 190*   < >  109*  BUN 22   < > 14  CREATININE 1.25*   < > 1.06  CALCIUM 9.0   < > 8.6*  MG  --    < > 1.8  AST 23  --   --   ALT 16  --   --   ALKPHOS 72  --   --   BILITOT 0.6  --   --    < > = values in this interval not displayed.   RADIOLOGY:  No results found. ASSESSMENT AND  PLAN:   Recurrent Clostridium difficile diarrhea Started Dificid, dicontinued p.o. vancomycin and IV Flagyl.   Repeat C. difficile test is negative.  Allergy to Flagyl.  Discontinued Flagyl.  Improved. discontinue IV Solu-Medrol, continue Benadryl as needed.  Hypokalemia.  Given potassium supplement IV normal saline with potassium.  Improved. Hyponatremia.  Improved with normal saline IV.    Atrial flutter, paroxysmal ([place]) -continue home meds   Type 2 diabetes mellitus (HCC) -sliding scale insulin with corresponding glucose checks   Benign essential HTN -BP is low side.  Maxzide was discontinued.   HLD (hyperlipidemia) -Home dose antilipid  Discussed with Dr. Vicente Males. All the records are reviewed and case discussed with Care Management/Social Worker. Management plans discussed with the patient, his wife and they are in agreement.  CODE STATUS: Full Code  TOTAL TIME TAKING CARE OF THIS PATIENT: 33 minutes.   More than 50% of the time was spent in counseling/coordination of care: YES  POSSIBLE D/C IN 1-2 DAYS, DEPENDING ON CLINICAL CONDITION.   Demetrios Loll M.D on 03/28/2018 at 4:37 PM  Between 7am to 6pm - Pager - [phone]  After 6pm go to [email or web address] - [facility]

## 2018-03-28 NOTE — Progress Notes (Signed)
Mike Holloway , MD [address], [place], [place], [place], [address] [address] [place], [place], [place], [address] Phone: [phone]  Fax: [phone]   Kotaro Buer is being followed for c diff colitis  Day  Subjective: Had swelling of tongue yesterday , discussed with wife, began before starting dificid along with nausea and was then given steroids, benadryl and felt better. Today no symptoms , no bowel movement.    Objective: Vital signs in last 24 hours: Vitals:   03/27/18 1952 03/27/18 2011 03/28/18 0457 03/28/18 0957  BP: 137/84 (!) 143/80 121/72 108/65  Pulse: 96 100 (!) 53 74  Resp: 16  18 20   Temp: 98.4 F (36.9 C) 99.2 F (37.3 C) 98.3 F (36.8 C) 98.4 F (36.9 C)  TempSrc: Axillary Oral Oral Oral  SpO2: 98%  97% 98%  Weight:      Height:       Weight change:   Intake/Output Summary (Last 24 hours) at 03/28/2018 1203 Last data filed at 03/28/2018 0300 Gross per 24 hour  Intake 1018 ml  Output -  Net 1018 ml     Exam: Heart:: Regular rate and rhythm, S1S2 present or without murmur or extra heart sounds Lungs: normal, clear to auscultation and clear to auscultation and percussion Abdomen: soft, nontender, normal bowel sounds   Lab Results: @LABTEST2 @ Micro Results: Recent Results (from the past 240 hour(s))  C difficile quick scan w PCR reflex     Status: Abnormal   Collection Time: 03/24/18  3:35 PM  Result Value Ref Range Status   C Diff antigen POSITIVE (A) NEGATIVE Final   C Diff toxin POSITIVE (A) NEGATIVE Final   C Diff interpretation Toxin producing C. difficile detected.  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 Performed at [facility], [address] [place], [place] [address]   Gastrointestinal Panel by PCR , Stool     Status: None   Collection Time: 03/24/18  5:20 PM  Result Value Ref Range Status   Campylobacter species NOT DETECTED NOT  DETECTED Final   Plesimonas shigelloides NOT DETECTED NOT DETECTED Final   Salmonella species NOT DETECTED NOT DETECTED Final   Yersinia enterocolitica NOT DETECTED NOT DETECTED Final   Vibrio species NOT DETECTED NOT DETECTED Final   Vibrio cholerae NOT DETECTED NOT DETECTED Final   Enteroaggregative E coli (EAEC) NOT DETECTED NOT DETECTED Final   Enteropathogenic E coli (EPEC) NOT DETECTED NOT DETECTED Final   Enterotoxigenic E coli (ETEC) NOT DETECTED NOT DETECTED Final   Shiga like toxin producing E coli (STEC) NOT DETECTED NOT DETECTED Final   Shigella/Enteroinvasive E coli (EIEC) NOT DETECTED NOT DETECTED Final   Cryptosporidium NOT DETECTED NOT DETECTED Final   Cyclospora cayetanensis NOT DETECTED NOT DETECTED Final   Entamoeba histolytica NOT DETECTED NOT DETECTED Final   Giardia lamblia NOT DETECTED NOT DETECTED Final   Adenovirus F40/41 NOT DETECTED NOT DETECTED Final   Astrovirus NOT DETECTED NOT DETECTED Final   Norovirus GI/GII NOT DETECTED NOT DETECTED Final   Rotavirus A NOT DETECTED NOT DETECTED Final   Sapovirus (I, II, IV, and V) NOT DETECTED NOT DETECTED Final    Comment: Performed at [facility], [address] [place], [place] [address]  C difficile quick scan w PCR reflex     Status: None   Collection Time: 03/27/18  2:53 PM  Result Value Ref  Range Status   C Diff antigen NEGATIVE NEGATIVE Final   C Diff toxin NEGATIVE NEGATIVE Final   C Diff interpretation No C. difficile detected.  Final    Comment: Performed at [facility], [address] [place], [place] [address]   Studies/Results: No results found. Medications: I have reviewed the patient's current medications. Scheduled Meds: . enoxaparin (LOVENOX) injection  40 mg Subcutaneous Q24H  . famotidine  20 mg Oral BID  . fidaxomicin  200 mg Oral BID  . FLUoxetine  10 mg Oral Daily  . insulin aspart  0-5 Units Subcutaneous QHS  . insulin aspart  0-9 Units Subcutaneous TID WC  .  methylPREDNISolone (SOLU-MEDROL) injection  60 mg Intravenous Q24H  . mirabegron ER  50 mg Oral Daily  . montelukast  10 mg Oral QHS  . primidone  100 mg Oral q morning - 10a  . primidone  50 mg Oral QHS  . propranolol ER  80 mg Oral BID  . tamsulosin  0.4 mg Oral Daily   Continuous Infusions: . 0.9 % NaCl with KCl 40 mEq / L 50 mL/hr (03/28/18 1144)   PRN Meds:.acetaminophen **OR** acetaminophen, diphenhydrAMINE, ondansetron **OR** ondansetron (ZOFRAN) IV   Assessment: Principal Problem:   Recurrent Clostridium difficile diarrhea Active Problems:   Atrial flutter, paroxysmal (HCC)   Type 2 diabetes mellitus ([place])   Benign essential HTN   HLD (hyperlipidemia)  Charley Miske 77 y.o. 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 Developed allergic reaction to Flagyl with shaking , severe nausea, swelling of tongue. Dificid was also held. Discussed with wife , she was clear that she noticed a change in his speech before the dificid was started.   Plan: 1. Restart dificid  2. A second round of stool transplant is warranted per the guidelines but [place] as a whole has put a stop on stool transplants at this time due to a recent FDA alert. We are yet to receive guidance on when it will resume.An option would be to refer to [facility] as an outpatient  Discussed plan with nursing and Dr Bridgett Larsson .     LOS: 4 days   Mike Bellows, MD 03/28/2018, 12:03 PM

## 2018-03-29 LAB — BASIC METABOLIC PANEL
Anion gap: 3 — ABNORMAL LOW (ref 5–15)
BUN: 15 mg/dL (ref 8–23)
CO2: 23 mmol/L (ref 22–32)
Calcium: 8.8 mg/dL — ABNORMAL LOW (ref 8.9–10.3)
Chloride: 112 mmol/L — ABNORMAL HIGH (ref 98–111)
Creatinine, Ser: 1.11 mg/dL (ref 0.61–1.24)
GFR calc Af Amer: >60 mL/min (ref >60)
GFR calc non Af Amer: >60 mL/min (ref >60)
Glucose, Bld: 92 mg/dL (ref 70–99)
Potassium: 4.2 mmol/L (ref 3.5–5.1)
Sodium: 138 mmol/L (ref 135–145)

## 2018-03-29 LAB — GLUCOSE, CAPILLARY
Glucose-Capillary: 83 mg/dL (ref 70–99)
Glucose-Capillary: 98 mg/dL (ref 70–99)

## 2018-03-29 LAB — MAGNESIUM: Magnesium: 1.8 mg/dL (ref 1.7–2.4)

## 2018-03-29 MED ORDER — FIDAXOMICIN 200 MG PO TABS: 200.0000 mg | ORAL_TABLET | Freq: Two times a day (BID) | ORAL | 0 refills | Status: DC

## 2018-03-29 NOTE — Progress Notes (Signed)
Discharge teaching given to patient, patient verbalized understanding and had no questions. Patient IV removed. Patient will be transported home by family. All patient belongings gathered prior to leaving.  

## 2018-03-29 NOTE — Progress Notes (Signed)
Mike Holloway , MD [address] [place], [place], [place], [address] [address], [place], [place], [place], [address] Phone: [phone]  Fax: [phone]   Sakari Alkhatib is being followed for c diff diarrhea  Subjective: Doing better, no issues with dificid    Objective: Vital signs in last 24 hours: Vitals:   03/28/18 2027 03/29/18 0520 03/29/18 1023 03/29/18 1223  BP: 140/73 (!) 147/69 131/67 125/68  Pulse: 66 (!) 51 (!) 58 64  Resp: 20 20  16   Temp: 98.3 F (36.8 C) 98.3 F (36.8 C)  98.4 F (36.9 C)  TempSrc: Oral Oral  Oral  SpO2: 96% 97%  95%  Weight:      Height:       Weight change:   Intake/Output Summary (Last 24 hours) at 03/29/2018 1304 Last data filed at 03/29/2018 0930 Gross per 24 hour  Intake -  Output 0 ml  Net 0 ml     Exam: Heart:: Regular rate and rhythm, S1S2 present or without murmur or extra heart sounds Lungs: normal, clear to auscultation and clear to auscultation and percussion Abdomen: soft, nontender, normal bowel sounds   Lab Results: @LABTEST2 @ Micro Results: Recent Results (from the past 240 hour(s))  C difficile quick scan w PCR reflex     Status: Abnormal   Collection Time: 03/24/18  3:35 PM  Result Value Ref Range Status   C Diff antigen POSITIVE (A) NEGATIVE Final   C Diff toxin POSITIVE (A) NEGATIVE Final   C Diff interpretation Toxin producing C. difficile detected.  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 Performed at [facility], [address] [place], [place] [address]   Gastrointestinal Panel by PCR , Stool     Status: None   Collection Time: 03/24/18  5:20 PM  Result Value Ref Range Status   Campylobacter species NOT DETECTED NOT DETECTED Final   Plesimonas shigelloides NOT DETECTED NOT DETECTED Final   Salmonella species NOT DETECTED NOT DETECTED Final   Yersinia enterocolitica NOT DETECTED NOT DETECTED Final   Vibrio species NOT DETECTED NOT DETECTED Final   Vibrio cholerae NOT DETECTED NOT DETECTED Final   Enteroaggregative E coli (EAEC) NOT DETECTED NOT DETECTED Final   Enteropathogenic E coli (EPEC) NOT DETECTED NOT DETECTED Final   Enterotoxigenic E coli (ETEC) NOT DETECTED NOT DETECTED Final   Shiga like toxin producing E coli (STEC) NOT DETECTED NOT DETECTED Final   Shigella/Enteroinvasive E coli (EIEC) NOT DETECTED NOT DETECTED Final   Cryptosporidium NOT DETECTED NOT DETECTED Final   Cyclospora cayetanensis NOT DETECTED NOT DETECTED Final   Entamoeba histolytica NOT DETECTED NOT DETECTED Final   Giardia lamblia NOT DETECTED NOT DETECTED Final   Adenovirus F40/41 NOT DETECTED NOT DETECTED Final   Astrovirus NOT DETECTED NOT DETECTED Final   Norovirus GI/GII NOT DETECTED NOT DETECTED Final   Rotavirus A NOT DETECTED NOT DETECTED Final   Sapovirus (I, II, IV, and V) NOT DETECTED NOT DETECTED Final    Comment: Performed at [facility], [address] [place], [place] [address]  C difficile quick scan w PCR reflex     Status: None   Collection Time: 03/27/18  2:53 PM  Result Value Ref Range Status   C Diff antigen NEGATIVE NEGATIVE Final   C Diff toxin NEGATIVE NEGATIVE Final   C Diff interpretation No C. difficile detected.  Final  Comment: Performed at [facility], [address] [place], [place] [address]   Studies/Results: No results found. Medications: I have reviewed the patient's current medications. Scheduled Meds: . enoxaparin (LOVENOX) injection  40 mg Subcutaneous Q24H  . famotidine  20 mg Oral BID  . fidaxomicin  200 mg Oral BID  . FLUoxetine  10 mg Oral Daily  . insulin aspart  0-5 Units Subcutaneous QHS  . insulin aspart  0-9 Units Subcutaneous TID WC  . mirabegron ER  50 mg Oral Daily  . montelukast  10 mg Oral QHS  . primidone  100 mg Oral q morning - 10a  . primidone  50 mg Oral QHS  . propranolol ER  80 mg Oral BID  . tamsulosin  0.4 mg  Oral Daily   Continuous Infusions: PRN Meds:.acetaminophen **OR** acetaminophen, diphenhydrAMINE, ondansetron **OR** ondansetron (ZOFRAN) IV   Assessment: Principal Problem:   Recurrent Clostridium difficile diarrhea Active Problems:   Atrial flutter, paroxysmal (HCC)   Type 2 diabetes mellitus ([place])   Benign essential HTN   HLD (hyperlipidemia)  Mike Kreiter Borden77 y.o.maleS/p Stool transplant for recurrent C diff diarrhea on 03/17/18 .Readmitted for recurrence of C diff diarrhea. Developed allergic reaction to Flagyl with shaking , severe nausea, swelling of tongue. Doing well on Dificid   Plan: 1. Continue Difificid, f/u with me in clinic before stopping the medication - may need a slower taper. I will try to get intouch with the company to help extend the course for a slow taper.  2. A second round of stool transplant is warranted per the guidelines but [place] as a whole has put a stop on stool transplants at this time due to a recent FDA alert. We are yet to receive guidance on when it will resume.An option would be to refer to [facility] an outpatient, can be decided based on how he does with dificid.      LOS: 5 days   Mike Bellows, MD 03/29/2018, 1:04 PM

## 2018-03-29 NOTE — Discharge Instructions (Signed)
Clostridium Difficile Infection   Clostridium difficile (C. difficile or C. diff) infection causes inflammation of the large intestine (colon). This condition can result in damage to the lining of your colon and may lead to another condition called colitis. This infection can be passed from person to person (is contagious).  Follow these instructions at home:  Eating and drinking   · Drink enough fluid to keep your pee (urine) clear or pale yellow.  · Avoid drinking:  ? Milk.  ? Caffeine.  ? Alcohol.  · Follow exact instructions from your doctor about how to get enough fluid in your body (rehydrate).  · Eat small meals often instead of large meals.  Medicines   · Take your antibiotic medicine as told by your doctor. Do not stop taking the antibiotic even if you start to feel better unless your doctor told you to do that.  · Take over-the-counter and prescription medicines only as told by your doctor.  · Do not use medicines to help with watery poop (diarrhea).  General instructions   · Wash your hands fully before you prepare food and after you use the bathroom. Make sure people who live with you also wash their  · hands often.  · Clean the surfaces that you touch. Use a product that contains chlorine bleach.  · Keep all follow-up visits as told by your doctor. This is important.  Contact a doctor if:  · Your symptoms do not get better with treatment.  · Your symptoms get worse with treatment.  · Your symptoms go away and then come back.  · You have a fever.  · You have new symptoms.  Get help right away if:  · You have more pain or tenderness in your belly (abdomen).  · Your poop (stool) is mostly bloody.  · Your poop looks dark black and tarry.  · You cannot eat or drink without throwing up (vomiting).  · You have signs of dehydration, such as:  ? Dark pee, very little pee, or no pee.  ? Cracked lips.  ? Not making tears when you cry.  ? Dry mouth.  ? Sunken eyes.  ? Feeling sleepy.  ? Feeling weak.  ? Feeling  dizzy.  This information is not intended to replace advice given to you by your health care provider. Make sure you discuss any questions you have with your health care provider.  Document Released: 07/11/2009 Document Revised: 02/19/2016 Document Reviewed: 03/17/2015  Elsevier Interactive Patient Education © 2017 Elsevier Inc.

## 2018-03-29 NOTE — Care Management Important Message (Signed)
Important Message  Patient Details  Name: Mike Holloway MRN: [id number] Date of Birth: January 04, 1941   Medicare Important Message Given:  Yes    Beverly Sessions, RN 03/29/2018, 4:41 PM

## 2018-03-29 NOTE — Progress Notes (Signed)
Contacted [facility] specialty pharmacy about filling Rx for Dificid for pt. Test claim was run, approved w/out PA, but rx had been filled somewhere else. After investigation by patient advocate, rx was processed by [employer], who was attempting to contact patient. Called [employer] on behalf of the patient. They stated they were following up on copay assistance forms that had been mailed to pt in March. Wanted to make sure they had completed and given to MD. I went a spoke with the patients and his wife. Wife states that she started to fill out the forms but was uncomfortable signing that the medication would cause a financial burden and she threw the forms away. I attempted to explain to the wife that this medication was very expensive and if they qualified (which they did) it was ok to accept the assistance. [employer] was able to tell me the price without using copay assistance. $1095.03. This information was relayed to pt and wife and they states they would pay this. Unfortunately [facility] would not be able to provide medication until Friday. I asked them to reverse the claim so it could be filled somewhere else. Pt is being discharged today. Contacted Inez Catalina, patient advocate at [facility] to see if they could fill the medication today. Unfortunately, it was not in stock. [facility] inpatient pharmacy will loan medication to [facility] employee pharmacy to fill prescription for pt and then they will return stock to inpatient pharmacy once their order comes in. Will deliver medication to bedside before discharge.  Ramond Dial, Pharm.D, BCPS Clinical Pharmacist

## 2018-03-29 NOTE — Discharge Summary (Signed)
[place] [facility] [place] NAME: Mike Holloway    MR#:  [id number]  DATE OF BIRTH:  1941-08-31  DATE OF ADMISSION:  03/24/2018   ADMITTING PHYSICIAN: Lance Coon, MD  DATE OF DISCHARGE: 03/29/2018 PRIMARY CARE PHYSICIAN: [name], MD   ADMISSION DIAGNOSIS:  Recurrent Clostridium difficile diarrhea [A04.71] DISCHARGE DIAGNOSIS:  Principal Problem:   Recurrent Clostridium difficile diarrhea Active Problems:   Atrial flutter, paroxysmal (HCC)   Type 2 diabetes mellitus (HCC)   Benign essential HTN   HLD (hyperlipidemia)  SECONDARY DIAGNOSIS:   Past Medical History:  Diagnosis Date  . Arthritis   . Atrial flutter ([place])   . Diabetes mellitus ([place])   . Essential tremor   . Hypercholesteremia   . Hypertension   . Incontinence   . 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 Sleep apnea   . Stroke [facility])    HOSPITAL COURSE:  Recurrent Clostridium difficile diarrhea Started Dificid, dicontinued p.o. vancomycin and IV Flagyl.   Repeated C. difficile test is negative.  Allergy to Flagyl.  Discontinued Flagyl.  Improved. He was given IV Solu-Medrol, and Benadryl as needed.  Hypokalemia.  Given potassium supplement IV normal saline with potassium.  Improved. Hyponatremia.  Improved with normal saline IV.  Atrial flutter, paroxysmal ([place]) -continue home meds Type 2 diabetes mellitus (HCC) -sliding scale insulin with corresponding glucose checks Benign essential HTN -BP is low side.  Maxzide was discontinued. HLD (hyperlipidemia) -Home dose antilipid  Discussed with Dr. Vicente Males. DISCHARGE CONDITIONS:  Stable, discharge to home today. CONSULTS OBTAINED:   DRUG ALLERGIES:   Allergies  Allergen Reactions  . Clindamycin Diarrhea    Contracted C. Diff x 2  . Flagyl [Metronidazole] Swelling    Swollen tongue, excessive shaking,    . Ambien  [Zolpidem]     Other reaction(s): Other (See  Comments) Disoriented and moody Other reaction(s): Other (See Comments) Disoriented and moody  . Penicillins Rash    Has patient had a PCN reaction causing immediate rash, facial/tongue/throat swelling, SOB or lightheadedness with hypotension: Unknown Has patient had a PCN reaction causing severe rash involving mucus membranes or skin necrosis: Unknown Has patient had a PCN reaction that required hospitalization: No Has patient had a PCN reaction occurring within the last 10 years: No If all of the above answers are "NO", then may proceed with Cephalosporin use.  . Sulfa Antibiotics Rash   DISCHARGE MEDICATIONS:   Allergies as of 03/29/2018      Reactions   Clindamycin Diarrhea   Contracted C. Diff x 2   Flagyl [metronidazole] Swelling   Swollen tongue, excessive shaking,     Ambien  [zolpidem]    Other reaction(s): Other (See Comments) Disoriented and moody Other reaction(s): Other (See Comments) Disoriented and moody   Penicillins Rash   Has patient had a PCN reaction causing immediate rash, facial/tongue/throat swelling, SOB or lightheadedness with hypotension: Unknown Has patient had a PCN reaction causing severe rash involving mucus membranes or skin necrosis: Unknown Has patient had a PCN reaction that required hospitalization: No Has patient had a PCN reaction occurring within the last 10 years: No If all of the above answers are "NO", then may proceed with Cephalosporin use.   Sulfa Antibiotics Rash      Medication List    STOP taking these medications   acidophilus Caps capsule   CITRUCEL oral powder Generic drug:  methylcellulose   docusate sodium 100 MG  capsule Commonly known as:  COLACE   famotidine 20 MG tablet Commonly known as:  PEPCID   mirabegron ER 50 MG Tb24 tablet Commonly known as:  MYRBETRIQ   MIRALAX PO   triamterene-hydrochlorothiazide 37.5-25 MG tablet Commonly known as:  MAXZIDE-25     TAKE these medications   diclofenac sodium 1 %  Gel Commonly known as:  VOLTAREN Apply 2 g topically 2 (two) times daily as needed (pain).   fidaxomicin 200 MG Tabs tablet Commonly known as:  DIFICID Take 1 tablet (200 mg total) by mouth 2 (two) times daily.   FLUoxetine 10 MG tablet Commonly known as:  PROZAC Take 10 mg by mouth daily.   montelukast 10 MG tablet Commonly known as:  SINGULAIR Take 10 mg by mouth at bedtime.   NASACORT AQ NA Place 2 sprays into the nose daily.   primidone 50 MG tablet Commonly known as:  MYSOLINE Take 50-100 mg by mouth 2 (two) times daily. 100 mg in the morning and 50 mg at bedtime   propranolol ER 80 MG 24 hr capsule Commonly known as:  INDERAL LA Take 160 mg by mouth daily.   tamsulosin 0.4 MG Caps capsule Commonly known as:  FLOMAX Take 0.4 mg by mouth daily.   VITAMIN D-1000 MAX ST 1000 units tablet Generic drug:  Cholecalciferol Take 1,000 Units by mouth at bedtime.   VSL#3 PO Take 1 capsule by mouth 2 (two) times daily.        DISCHARGE INSTRUCTIONS:  See AVS.  If you experience worsening of your admission symptoms, develop shortness of breath, life threatening emergency, suicidal or homicidal thoughts you must seek medical attention immediately by calling 911 or calling your MD immediately  if symptoms less severe.  You Must read complete instructions/literature along with all the possible adverse reactions/side effects for all the Medicines you take and that have been prescribed to you. Take any new Medicines after you have completely understood and accpet all the possible adverse reactions/side effects.   Please note  You were cared for by a hospitalist during your hospital stay. If you have any questions about your discharge medications or the care you received while you were in the hospital after you are discharged, you can call the unit and asked to speak with the hospitalist on call if the hospitalist that took care of you is not available. Once you are discharged,  your primary care physician will handle any further medical issues. Please note that NO REFILLS for any discharge medications will be authorized once you are discharged, as it is imperative that you return to your primary care physician (or establish a relationship with a primary care physician if you do not have one) for your aftercare needs so that they can reassess your need for medications and monitor your lab values.    On the day of Discharge:  VITAL SIGNS:  Blood pressure 125/68, pulse 64, temperature 98.4 F (36.9 C), temperature source Oral, resp. rate 16, height 5\' 8"  (1.727 m), weight 199 lb 8.3 oz (90.5 kg), SpO2 95 %. PHYSICAL EXAMINATION:  GENERAL:  77 y.o.-year-old patient lying in the bed with no acute distress.  EYES: Pupils equal, round, reactive to light and accommodation. No scleral icterus. Extraocular muscles intact.  HEENT: Head atraumatic, normocephalic. Oropharynx and nasopharynx clear.  NECK:  Supple, no jugular venous distention. No thyroid enlargement, no tenderness.  LUNGS: Normal breath sounds bilaterally, no wheezing, rales,rhonchi or crepitation. No use of accessory muscles of respiration.  CARDIOVASCULAR: S1, S2 normal. No murmurs, rubs, or gallops.  ABDOMEN: Soft, non-tender, non-distended. Bowel sounds present. No organomegaly or mass.  EXTREMITIES: No pedal edema, cyanosis, or clubbing.  NEUROLOGIC: Cranial nerves II through XII are intact. Muscle strength 5/5 in all extremities. Sensation intact. Gait not checked.  PSYCHIATRIC: The patient is alert and oriented x 3.  SKIN: No obvious rash, lesion, or ulcer.  DATA REVIEW:   CBC Recent Labs  Lab 03/25/18 0722  WBC 7.2  HGB 11.2*  HCT 32.1*  PLT 118*    Chemistries  Recent Labs  Lab 03/24/18 2335  03/29/18 0424  NA 130*   < > 138  K 3.5   < > 4.2  CL 102   < > 112*  CO2 21*   < > 23  GLUCOSE 190*   < > 92  BUN 22   < > 15  CREATININE 1.25*   < > 1.11  CALCIUM 9.0   < > 8.8*  MG  --    < >  1.8  AST 23  --   --   ALT 16  --   --   ALKPHOS 72  --   --   BILITOT 0.6  --   --    < > = values in this interval not displayed.     Microbiology Results  Results for orders placed or performed during the hospital encounter of 03/24/18  C difficile quick scan w PCR reflex     Status: None   Collection Time: 03/27/18  2:53 PM  Result Value Ref Range Status   C Diff antigen NEGATIVE NEGATIVE Final   C Diff toxin NEGATIVE NEGATIVE Final   C Diff interpretation No C. difficile detected.  Final    Comment: Performed at [facility], [address] [place], [place] [address]    RADIOLOGY:  No results found.   Management plans discussed with the patient,  his wife and they are in agreement.  CODE STATUS: Full Code   TOTAL TIME TAKING CARE OF THIS PATIENT: 45 minutes.    Demetrios Loll M.D on 03/29/2018 at 1:44 PM  Between 7am to 6pm - Pager - [phone]  After 6pm go to [email or web address] - Proofreader  [facility]  Office  [phone]  CC: Primary care physician; [name], MD   Note: This dictation was prepared with Dragon dictation along with smaller phrase technology. Any transcriptional errors that result from this process are unintentional.

## 2018-03-31 LAB — OVA + PARASITE EXAM

## 2018-03-31 LAB — O&P RESULT

## 2018-04-03 ENCOUNTER — Telehealth: Payer: Self-pay

## 2018-04-03 ENCOUNTER — Telehealth: Payer: Self-pay | Admitting: Gastroenterology

## 2018-04-03 NOTE — Telephone Encounter (Signed)
Received return call from Mrs. Wollen.  She mentioned that she completed the first EMMI call and that her question stemmed from something showing on the discharge papers that Dr. Vicente Males said not to do.  She has since spoken with Dr. Georgeann Oppenheim office to clarify.  Reports patient's diarrhea improving and that he's "more with it" today than previously.  I thanked her for returning my call and informed her that they would receive one more automated call checking on them in the next few days.

## 2018-04-03 NOTE — Telephone Encounter (Signed)
Flagged on EMMI report for having questions about discharge papers.  Called and spoke with Anderson Malta, daughter.  She mentioned patient unavailable and that her mother would be best person to speak with, however she was at an appointment.  Anderson Malta took a message with my number for Mrs. Gills to return call when she returned.  Will follow up tomorrow if needed.

## 2018-04-03 NOTE — Telephone Encounter (Signed)
Spoke with pt's wife. Diarrhea somewhat better. No odor since he left the hospital but still diarrhea. Pt has an appt with PCP tomorrow. She will call me Wednesday for an update on his diarrhea. Last Dificid will be Wednesday.

## 2018-04-03 NOTE — Telephone Encounter (Signed)
Patient LVM that Dr. Vicente Males wanted patient to call you concerning a problem. Please call

## 2018-04-06 ENCOUNTER — Telehealth: Payer: Self-pay | Admitting: Gastroenterology

## 2018-04-06 NOTE — Telephone Encounter (Signed)
Patients wife called and stated he is doing better today. No diarrhea since early last night. Please call her back.

## 2018-04-07 NOTE — Telephone Encounter (Signed)
Spoke with pt's wife. She advised me pt seems to be doing better. Stools are more formed. He has only had 2 bowel movements today. She will watch through the weekend and call me Monday.

## 2018-04-12 ENCOUNTER — Telehealth: Payer: Self-pay

## 2018-04-12 ENCOUNTER — Other Ambulatory Visit: Payer: Self-pay

## 2018-04-12 DIAGNOSIS — D122 Benign neoplasm of ascending colon: Principal | ICD-10-CM

## 2018-04-12 DIAGNOSIS — K635 Polyp of colon: Secondary | ICD-10-CM

## 2018-04-12 NOTE — Telephone Encounter (Signed)
Patient's wife contacted office to return [place] phone call.  She stated that her husband is too weak to have repeat colonoscopy as Dr. Vicente Males requested (Colonoscopy performed showed large polyp).  Procedure has been canceled with Evelena Peat in Endo.  Informed Ginger of this message and patients wife request to have her call back.  Thanks [employer]

## 2018-04-24 ENCOUNTER — Ambulatory Visit: Admit: 2018-04-24 | Payer: Medicare Other | Admitting: Gastroenterology

## 2018-04-24 SURGERY — COLONOSCOPY WITH PROPOFOL
Anesthesia: General

## 2018-04-25 ENCOUNTER — Telehealth: Payer: Self-pay | Admitting: Urology

## 2018-04-25 NOTE — Telephone Encounter (Signed)
Pt's wife called asking for more samples of Myrbetriq 50mg .  We have not seen this  patient since 09-15-17. Please advise    Sharyn Lull

## 2018-04-25 NOTE — Telephone Encounter (Signed)
Myrbetriq 50 was put up front for patient to pick up. He is scheduled for an office visit in December.

## 2018-04-26 ENCOUNTER — Encounter: Payer: Self-pay | Admitting: Physical Therapy

## 2018-04-26 ENCOUNTER — Ambulatory Visit: Payer: Medicare Other | Attending: Neurosurgery | Admitting: Physical Therapy

## 2018-04-26 DIAGNOSIS — R269 Unspecified abnormalities of gait and mobility: Secondary | ICD-10-CM

## 2018-04-26 DIAGNOSIS — M6281 Muscle weakness (generalized): Secondary | ICD-10-CM | POA: Diagnosis not present

## 2018-04-26 DIAGNOSIS — R293 Abnormal posture: Secondary | ICD-10-CM | POA: Insufficient documentation

## 2018-04-26 NOTE — Patient Instructions (Signed)
Access Code: [email or web address]  URL: [email or web address]  Date: 04/26/2018  Prepared by: Dorcas Carrow   Exercises  Beginner Clam - 15 reps - 2 sets - 1x daily - 7x weekly  Beginner Bridge - 15 reps - 2 sets - 1x daily - 7x weekly  Supine Straight Leg Raises - 15 reps - 2 sets - 1x daily - 7x weekly  Dead Bug - 15 reps - 2 sets - 1x daily - 7x weekly

## 2018-04-26 NOTE — Therapy (Addendum)
[facility] [address]. [place], [place], [address] Phone: [phone]   Fax:  [phone]  Physical Therapy Evaluation  Patient Details  Name: Mike Holloway MRN: [id number] Date of Birth: 07-24-1941 Referring Provider: Dr. Manson Passey   Encounter Date: 04/26/2018  PT End of Session - 04/26/18 1400    Visit Number  1    Number of Visits  8    Date for PT Re-Evaluation  05/24/18    PT Start Time  8101    PT Stop Time  1444    PT Time Calculation (min)  56 min    Activity Tolerance  Patient tolerated treatment well    Behavior During Therapy  [facility] for tasks assessed/performed       Past Medical History:  Diagnosis Date  . Arthritis   . Atrial flutter ([place])   . Diabetes mellitus ([place])   . Essential tremor   . Hypercholesteremia   . Hypertension   . Incontinence   . 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 Sleep apnea   . Stroke [facility])     Past Surgical History:  Procedure Laterality Date  . ABLATION    . APPENDECTOMY    . CATARACT EXTRACTION, BILATERAL    . COLONOSCOPY WITH PROPOFOL N/A 03/17/2018   Procedure: COLONOSCOPY WITH PROPOFOL;  Surgeon: Jonathon Bellows, MD;  Location: [facility] ENDOSCOPY;  Service: Gastroenterology;  Laterality: N/A;  . DEEP BRAIN STIMULATOR PLACEMENT    . 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 HEMORRHOID SURGERY    . HERNIA REPAIR    . NASAL SINUS SURGERY    . ORCHIECTOMY    . TONSILLECTOMY      There were no vitals filed for this visit.     Pt. returned to PT after several recent set backs due to C-Diff/ couple of hospital stays at [facility]. Pts. wife reports his walking and balance has worsened and is less active. Pt. is also complaining of persistent dry eyes.        Reviewed HEP (see handouts)  Gait training:  Amb. In clinic with use of [place]  working on maintaining proper  BOS/ consistent step pattern/ heel strike.  Pt. Amb. Outside to car with Deputy.          Pt. 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 Pt. reports no pain and had to turn off stimulator to assist with communication. Pt. has been recently seen by PT prior to return to hospital secondary to C-diff. Pts. wife reports marked decrease in LE strength/ balance since recent hospitalization/ lack of exercise. Pt. currently ambulates with use of RW in home/ clinic for balance/ safety. Pt. hoping to return to using [facility] with home/ community ambulation. B UE AROM WFL and strength grossly 5/5 MMT. Grip strength: R 49#/ L 48#, B quad/ hamtring strength grossly 5/5 MMT and B hip flexion/ abd. 4/5 MMT on R and 3+/5 MMT on L. Berg balance test: 39/56 (high fall risk). Pt. will benefit from skilled PT services to increase B LE muscle strength to improve independence with gait/ balance.       PT Long Term Goals - 05/01/18 1243      PT LONG TERM GOAL #1   Title  Pt. independent with HEP to increase  B hip flexion/ abduction strength 1/2 muscle grade to improve pain-free mobility.      Baseline  B quad/ hamtring strength grossly 5/5 MMT and B hip flexion/ abd. 4/5 MMT on R and 3+/5 MMT on L.    Time  4    Period  Weeks    Status  New    Target Date  05/24/18      PT LONG TERM GOAL #2   Title  Pt. will increase Berg balance test to >45 out of 56 to improve independence with gait/ decrease fall risk.      Baseline  Berg: 39/56    Time  4    Period  Weeks    Status  New    Target Date  05/24/18      PT LONG TERM GOAL #3   Title  Pt. able to ambulate 100 feet with consistent 2 point gait pattern and use of SPC to improve household mobility.     Baseline  Pt. currently using RW    Time  4    Period  Weeks    Status  New    Target Date  05/24/18      PT LONG TERM GOAL #4   Title  Pt. able to stand from normal chair with no UE  assist to improve safety/independence with transfers.     Baseline  extra time and UE assist required to stand    Time  4    Period  Weeks    Status  New    Target Date  05/24/18         Patient will benefit from skilled therapeutic intervention in order to improve the following deficits and impairments:  Abnormal gait, Decreased balance, Decreased endurance, Decreased mobility, Difficulty walking, Decreased range of motion, Decreased activity tolerance, Decreased strength, Impaired flexibility, Postural dysfunction, Pain  Visit Diagnosis: Muscle weakness (generalized)  Gait difficulty  Abnormal posture     Problem List Patient Active Problem List   Diagnosis Date Noted  . 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 HLD (hyperlipidemia) 03/24/2018  . Contusion of right knee 02/14/2018  . Bradycardia 12/28/2017  . 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 Chronic pain of right knee 08/10/2016  . Right ankle pain 08/10/2016  . Venous insufficiency of both lower extremities 05/13/2016  . 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[place]) 12/11/2015  . 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 Lymphoma, 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[place]) 04/03/2015  . Breathlessness on exertion 11/21/2014  . Breath shortness 11/21/2014  . Arthropathy 11/07/2014  . Atrial flutter, paroxysmal ([place]) 11/07/2014  . Type 2 diabetes mellitus ([place]) 11/07/2014  . Benign essential tremor 11/07/2014  . Benign essential HTN 11/07/2014  . Mixed incontinence 11/07/2014  . Hypercholesterolemia without hypertriglyceridemia 11/07/2014  . Apnea, sleep 11/07/2014  . Controlled type 2 diabetes mellitus without complication ([place]) 20/35/5974  . Pure hypercholesterolemia 11/07/2014  . Other abnormalities of gait and mobility 11/02/2011  . Abnormal gait 06/29/2011  . Discoordination 06/29/2011   Pura Spice, PT, DPT # [phone] 05/01/2018, 12:45 PM  [facility] [address] [place], [place], [address] Phone: [phone]   Fax:  [phone]  Name: Mike Holloway MRN: [id number] Date of Birth: Feb 11, 1941

## 2018-04-28 ENCOUNTER — Telehealth: Payer: Self-pay

## 2018-04-28 NOTE — Telephone Encounter (Signed)
I think once he is feeling completely normal for 3-4 weeks should be ok

## 2018-04-28 NOTE — Telephone Encounter (Signed)
Pt's wife is wanting to wait for a little while before scheduling colonoscopy. She is wanting his GI tract to get back to normal because she doesn't want the c diff to come back. When do you feel comfortable with him repeating?   When should they follow up with Dr. Allen Norris?

## 2018-04-28 NOTE — Telephone Encounter (Signed)
Pt will discuss with her husband and call back to schedule office appt on Monday.

## 2018-05-01 ENCOUNTER — Encounter: Payer: Self-pay | Admitting: Physical Therapy

## 2018-05-01 ENCOUNTER — Ambulatory Visit: Payer: Medicare Other | Attending: Neurosurgery | Admitting: Physical Therapy

## 2018-05-01 DIAGNOSIS — M6281 Muscle weakness (generalized): Secondary | ICD-10-CM | POA: Insufficient documentation

## 2018-05-01 DIAGNOSIS — R293 Abnormal posture: Secondary | ICD-10-CM | POA: Diagnosis present

## 2018-05-01 DIAGNOSIS — R269 Unspecified abnormalities of gait and mobility: Secondary | ICD-10-CM

## 2018-05-01 NOTE — Addendum Note (Signed)
Addended by: Dorcas Carrow C on: 05/01/2018 12:50 PM   Modules accepted: Orders

## 2018-05-02 NOTE — Therapy (Signed)
[facility] [address] [place], [place], [address] Phone: [phone]   Fax:  [phone]  Physical Therapy Treatment  Patient Details  Name: Mike Holloway MRN: [id number] Date of Birth: [birth year] Referring Provider: Dr. Manson Passey   Encounter Date: 05/01/2018  PT End of Session - 05/02/18 0756    Visit Number  2    Number of Visits  8    Date for PT Re-Evaluation  05/24/18    PT Start Time  1430    PT Stop Time  7867    PT Time Calculation (min)  44 min    Equipment Utilized During Treatment  Gait belt;Other (comment)    Activity Tolerance  Patient tolerated treatment well    Behavior During Therapy  [facility] for tasks assessed/performed       Past Medical History:  Diagnosis Date  . Arthritis   . Atrial flutter ([place])   . Diabetes mellitus ([place])   . Essential tremor   . Hypercholesteremia   . Hypertension   . Incontinence   . 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[place])    grew on the testical  . Sleep apnea   . Stroke [facility])     Past Surgical History:  Procedure Laterality Date  . ABLATION    . APPENDECTOMY    . CATARACT EXTRACTION, BILATERAL    . COLONOSCOPY WITH PROPOFOL N/A 03/17/2018   Procedure: COLONOSCOPY WITH PROPOFOL;  Surgeon: Jonathon Bellows, MD;  Location: [facility] ENDOSCOPY;  Service: Gastroenterology;  Laterality: N/A;  . DEEP BRAIN STIMULATOR PLACEMENT    . 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 HEMORRHOID SURGERY    . HERNIA REPAIR    . NASAL SINUS SURGERY    . ORCHIECTOMY    . TONSILLECTOMY      There were no vitals filed for this visit.  Subjective Assessment - 05/01/18 1905    Subjective  Pt. reports no new complaints upon arrival to therapy.  Pt. reports he still feels unsteady with gait.    Limitations  Lifting;Standing;Walking;House hold activities    Patient Stated Goals  Improve  LE strength/ gait and balance with daily tasks.  Return to using cane.      Currently in Pain?  No/denies       Treatment:     There.ex.: Scifit L6 10 min. Prior to session B UE/LE (no charge/ warm-up).  Pt. Struggled with proper foot placement on the NuStep, straps were not successful in keeping foot alignment. Standing hip exercises (all planes)-5#   Neuro: Hallway activities (head turning) with use of lofstrand crutches Static Stance on stable surface 4x30 sec Tandem Stance on stable surface 4x30 sec SLS Stance on stable surface 4x30 sec Static Stance with eyes closed on stable surface 4x30 sec Tandem Stance with eyes closed on stable surface 4x30 sec SLS Stance with eyes closed on stable surface 4x30 sec    PT Education - 05/02/18 0940    Education Details  Exercise technique    Person(s) Educated  Patient    Methods  Explanation;Demonstration;Verbal cues    Comprehension  Verbalized understanding;Returned demonstration;Verbal cues required;Need further instruction          PT Long Term Goals - 05/01/18 1243      PT LONG TERM GOAL #1   Title  Pt. independent with HEP to increase B hip flexion/ abduction strength 1/2 muscle  grade to improve pain-free mobility.      Baseline  B quad/ hamtring strength grossly 5/5 MMT and B hip flexion/ abd. 4/5 MMT on R and 3+/5 MMT on L.    Time  4    Period  Weeks    Status  New    Target Date  05/24/18      PT LONG TERM GOAL #2   Title  Pt. will increase Berg balance test to >45 out of 56 to improve independence with gait/ decrease fall risk.      Baseline  Berg: 39/56    Time  4    Period  Weeks    Status  New    Target Date  05/24/18      PT LONG TERM GOAL #3   Title  Pt. able to ambulate 100 feet with consistent 2 point gait pattern and use of SPC to improve household mobility.     Baseline  Pt. currently using RW    Time  4    Period  Weeks    Status  New    Target Date  05/24/18      PT LONG TERM GOAL #4    Title  Pt. able to stand from normal chair with no UE assist to improve safety/independence with transfers.     Baseline  extra time and UE assist required to stand    Time  4    Period  Weeks    Status  New    Target Date  05/24/18            Plan - 05/02/18 0757    Clinical Impression Statement  Pt. able to progress to using lofstrand crutches which he has used previously.  Pt. able to ambulate with lofstrand crutch and perform head turns well.  Pt. progressed with balance activities to include eyes closed in different standing positions.    Clinical Presentation  Evolving    Clinical Decision Making  Moderate    Rehab Potential  Fair    PT Frequency  2x / week    PT Duration  4 weeks    PT Treatment/Interventions  ADLs/Self Care Home Management;Therapeutic activities;Functional mobility training;Stair training;Gait training;Therapeutic exercise;Balance training;Neuromuscular re-education;Patient/family education;Manual techniques;Passive range of motion    PT Next Visit Plan  Progress balance training/ issue HEP    PT Home Exercise Plan  See handouts       Patient will benefit from skilled therapeutic intervention in order to improve the following deficits and impairments:  Abnormal gait, Decreased balance, Decreased endurance, Decreased mobility, Difficulty walking, Decreased range of motion, Decreased activity tolerance, Decreased strength, Impaired flexibility, Postural dysfunction, Pain  Visit Diagnosis: Muscle weakness (generalized)  Gait difficulty  Abnormal posture     Problem List Patient Active Problem List   Diagnosis Date Noted  . 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 HLD (hyperlipidemia) 03/24/2018  . Contusion of right knee 02/14/2018  . Bradycardia 12/28/2017  . 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 Chronic pain of right knee 08/10/2016  . Right ankle pain 08/10/2016  . Venous insufficiency of both lower  extremities 05/13/2016  . 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[place]) 12/11/2015  . 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 Lymphoma, 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[place]) 04/03/2015  . Breathlessness on exertion 11/21/2014  . Breath shortness 11/21/2014  . Arthropathy 11/07/2014  . Atrial flutter, paroxysmal ([place]) 11/07/2014  . Type 2 diabetes mellitus ([place]) 11/07/2014  . Benign essential tremor 11/07/2014  . Benign essential HTN 11/07/2014  .  Mixed incontinence 11/07/2014  . Hypercholesterolemia without hypertriglyceridemia 11/07/2014  . Apnea, sleep 11/07/2014  . Controlled type 2 diabetes mellitus without complication ([place]) 94/58/5929  . Pure hypercholesterolemia 11/07/2014  . Other abnormalities of gait and mobility 11/02/2011  . Abnormal gait 06/29/2011  . Discoordination 06/29/2011    Gwenlyn Saran, SPT 05/02/2018, 10:23 AM   This entire session was performed under direct supervision and direction of a licensed therapist/therapist assistant . I have personally read, edited and approve of the note as written. Collie Siad PT, DPT  [facility] [address] [place], [place], [address] Phone: [phone]   Fax:  [phone]  Name: Mike Holloway MRN: [id number] Date of Birth: May 30, 1941

## 2018-05-03 ENCOUNTER — Ambulatory Visit: Payer: Medicare Other | Admitting: Physical Therapy

## 2018-05-03 ENCOUNTER — Encounter: Payer: Self-pay | Admitting: Physical Therapy

## 2018-05-03 DIAGNOSIS — M6281 Muscle weakness (generalized): Secondary | ICD-10-CM | POA: Diagnosis not present

## 2018-05-03 DIAGNOSIS — R293 Abnormal posture: Secondary | ICD-10-CM

## 2018-05-03 DIAGNOSIS — R269 Unspecified abnormalities of gait and mobility: Secondary | ICD-10-CM

## 2018-05-03 NOTE — Therapy (Signed)
[facility] [address] [place], [place], [address] Phone: [phone]   Fax:  [phone]  Physical Therapy Treatment  Patient Details  Name: Mike Holloway MRN: [id number] Date of Birth: December 13, 1940 Referring Provider: Dr. Manson Passey   Encounter Date: 05/03/2018  PT End of Session - 05/03/18 1803    Visit Number  3    Number of Visits  8    Date for PT Re-Evaluation  05/24/18    PT Start Time  1427    PT Stop Time  1524    PT Time Calculation (min)  57 min    Equipment Utilized During Treatment  Gait belt;Other (comment)    Activity Tolerance  Patient tolerated treatment well    Behavior During Therapy  [facility] for tasks assessed/performed       Past Medical History:  Diagnosis Date  . Arthritis   . Atrial flutter ([place])   . Diabetes mellitus ([place])   . Essential tremor   . Hypercholesteremia   . Hypertension   . Incontinence   . 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 Sleep apnea   . Stroke [facility])     Past Surgical History:  Procedure Laterality Date  . ABLATION    . APPENDECTOMY    . CATARACT EXTRACTION, BILATERAL    . COLONOSCOPY WITH PROPOFOL N/A 03/17/2018   Procedure: COLONOSCOPY WITH PROPOFOL;  Surgeon: Jonathon Bellows, MD;  Location: [facility] ENDOSCOPY;  Service: Gastroenterology;  Laterality: N/A;  . DEEP BRAIN STIMULATOR PLACEMENT    . 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 HEMORRHOID SURGERY    . HERNIA REPAIR    . NASAL SINUS SURGERY    . ORCHIECTOMY    . TONSILLECTOMY      There were no vitals filed for this visit.  Subjective Assessment - 05/03/18 1750    Subjective  Pt. and wife report pt. is continuing to use rollator in order to maneuver around home.  Pt. reports it makes him feel much more steady than compared to loftsrand cane.    Limitations   Lifting;Standing;Walking;House hold activities    Patient Stated Goals  Improve LE strength/ gait and balance with daily tasks.  Return to using cane.      Currently in Pain?  No/denies          Treatment:     There.ex.: Scifit L6 5 min. Prior to session B UE/LE (no charge/ warm-up).             Pt. Struggled with proper foot placement on the NuStep, straps were not successful in keeping foot alignment. Standing hip exercises (all planes)-5#  Pt. Needed extended rest break in middle of standing exercises due to fatigue.   Neuro: Hallway activities (head turning/ coordination hands-to-knees) with no AD and modA and CGA for safety Static Stance on stable surface 4x30 sec Tandem Stance on stable surface 4x30 sec SLS Stance on stable surface 4x30 sec Static Stance with eyes closed on stable surface 4x30 sec Tandem Stance with eyes closed on stable surface 4x30 sec SLS Stance with eyes closed on stable surface 4x30 sec      PT Long Term Goals - 05/01/18 1243      PT LONG TERM GOAL #1   Title  Pt. independent with HEP to increase B hip flexion/ abduction strength 1/2  muscle grade to improve pain-free mobility.      Baseline  B quad/ hamtring strength grossly 5/5 MMT and B hip flexion/ abd. 4/5 MMT on R and 3+/5 MMT on L.    Time  4    Period  Weeks    Status  New    Target Date  05/24/18      PT LONG TERM GOAL #2   Title  Pt. will increase Berg balance test to >45 out of 56 to improve independence with gait/ decrease fall risk.      Baseline  Berg: 39/56    Time  4    Period  Weeks    Status  New    Target Date  05/24/18      PT LONG TERM GOAL #3   Title  Pt. able to ambulate 100 feet with consistent 2 point gait pattern and use of SPC to improve household mobility.     Baseline  Pt. currently using RW    Time  4    Period  Weeks    Status  New    Target Date  05/24/18      PT LONG TERM GOAL #4   Title  Pt. able to stand from normal chair with no UE assist  to improve safety/independence with transfers.     Baseline  extra time and UE assist required to stand    Time  4    Period  Weeks    Status  New    Target Date  05/24/18            Plan - 05/03/18 1803    Clinical Impression Statement  Pt. performed well today, however experienced mod. fatigue when performing walking exercises.  Pt. performed well with ther ex, however reported to be tired after doing standing exercises and needed to sit and rest before finishing exercises.  Pt. will continue with increase strengthening and dynamic balance exercises to return to using least restrictive AD to allow for more degrees of freedom.    Clinical Presentation  Evolving    Clinical Decision Making  Moderate    Rehab Potential  Fair    PT Frequency  2x / week    PT Duration  4 weeks    PT Treatment/Interventions  ADLs/Self Care Home Management;Therapeutic activities;Functional mobility training;Stair training;Gait training;Therapeutic exercise;Balance training;Neuromuscular re-education;Patient/family education;Manual techniques;Passive range of motion    PT Next Visit Plan  Progress balance training/ issue HEP    PT Home Exercise Plan  See handouts       Patient will benefit from skilled therapeutic intervention in order to improve the following deficits and impairments:  Abnormal gait, Decreased balance, Decreased endurance, Decreased mobility, Difficulty walking, Decreased range of motion, Decreased activity tolerance, Decreased strength, Impaired flexibility, Postural dysfunction, Pain  Visit Diagnosis: Muscle weakness (generalized)  Gait difficulty  Abnormal posture     Problem List Patient Active Problem List   Diagnosis Date Noted  . 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 HLD (hyperlipidemia) 03/24/2018  . Contusion of right knee 02/14/2018  . Bradycardia 12/28/2017  . 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 Chronic pain of right  knee 08/10/2016  . Right ankle pain 08/10/2016  . Venous insufficiency of both lower extremities 05/13/2016  . 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[place]) 12/11/2015  . 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 Lymphoma, 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[place]) 04/03/2015  . Breathlessness on exertion 11/21/2014  . Breath shortness 11/21/2014  . Arthropathy 11/07/2014  . Atrial flutter,  paroxysmal ([place]) 11/07/2014  . Type 2 diabetes mellitus ([place]) 11/07/2014  . Benign essential tremor 11/07/2014  . Benign essential HTN 11/07/2014  . Mixed incontinence 11/07/2014  . Hypercholesterolemia without hypertriglyceridemia 11/07/2014  . Apnea, sleep 11/07/2014  . Controlled type 2 diabetes mellitus without complication ([place]) 86/16/8372  . Pure hypercholesterolemia 11/07/2014  . Other abnormalities of gait and mobility 11/02/2011  . Abnormal gait 06/29/2011  . Discoordination 06/29/2011   Pura Spice, PT, DPT # [id number] Gwenlyn Saran, SPT 05/03/2018, 6:10 PM  [place] [address] [place], [place], [address] Phone: [phone]   Fax:  [phone]  Name: Key Cen MRN: [id number] Date of Birth: Mar 10, 1941

## 2018-05-09 ENCOUNTER — Ambulatory Visit: Payer: Medicare Other | Admitting: Physical Therapy

## 2018-05-09 DIAGNOSIS — M6281 Muscle weakness (generalized): Secondary | ICD-10-CM

## 2018-05-09 DIAGNOSIS — R293 Abnormal posture: Secondary | ICD-10-CM

## 2018-05-09 DIAGNOSIS — R269 Unspecified abnormalities of gait and mobility: Secondary | ICD-10-CM

## 2018-05-11 ENCOUNTER — Ambulatory Visit: Payer: Medicare Other | Admitting: Physical Therapy

## 2018-05-11 ENCOUNTER — Telehealth: Payer: Self-pay | Admitting: Urology

## 2018-05-11 DIAGNOSIS — M6281 Muscle weakness (generalized): Secondary | ICD-10-CM | POA: Diagnosis not present

## 2018-05-11 DIAGNOSIS — R269 Unspecified abnormalities of gait and mobility: Secondary | ICD-10-CM

## 2018-05-11 DIAGNOSIS — R293 Abnormal posture: Secondary | ICD-10-CM

## 2018-05-11 MED ORDER — MIRABEGRON ER 50 MG PO TB24: 50.0000 mg | ORAL_TABLET | Freq: Every day | ORAL | 5 refills | Status: DC

## 2018-05-11 NOTE — Telephone Encounter (Signed)
Pt wife called office stating that Dr. Pilar Jarvis was giving pt samples of Mybetriq 50mg  in green box, pt states she was told Dr. Pilar Jarvis is no longer with [place] and would like to know what is she to do about getting Mybetriq for her husband, states the Rx card didn't work for him because he's a medicare pt.  Please advise Pat @ [phone]. Pt has 4 weeks left of samples.

## 2018-05-11 NOTE — Telephone Encounter (Signed)
Patient's wife notified that we are currently do not have any samples at this time , will send in a script to [facility]

## 2018-05-11 NOTE — Therapy (Signed)
[facility] [address]. [place], [place], [address] Phone: [phone]   Fax:  [phone]  Physical Therapy Treatment  Patient Details  Name: Azeez Dunker MRN: [id number] Date of Birth: [birth year] Referring Provider: Dr. Manson Passey   Encounter Date: 05/09/2018  PT End of Session - 05/11/18 1311    Visit Number  4    Number of Visits  8    Date for PT Re-Evaluation  05/24/18    PT Start Time  1259    PT Stop Time  1356    PT Time Calculation (min)  57 min    Equipment Utilized During Treatment  Gait belt    Activity Tolerance  Patient tolerated treatment well    Behavior During Therapy  [facility] for tasks assessed/performed       Past Medical History:  Diagnosis Date  . Arthritis   . Atrial flutter ([place])   . Diabetes mellitus ([place])   . Essential tremor   . Hypercholesteremia   . Hypertension   . Incontinence   . 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 Sleep apnea   . Stroke [facility])     Past Surgical History:  Procedure Laterality Date  . ABLATION    . APPENDECTOMY    . CATARACT EXTRACTION, BILATERAL    . COLONOSCOPY WITH PROPOFOL N/A 03/17/2018   Procedure: COLONOSCOPY WITH PROPOFOL;  Surgeon: Jonathon Bellows, MD;  Location: [facility] ENDOSCOPY;  Service: Gastroenterology;  Laterality: N/A;  . DEEP BRAIN STIMULATOR PLACEMENT    . 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 HEMORRHOID SURGERY    . HERNIA REPAIR    . NASAL SINUS SURGERY    . ORCHIECTOMY    . TONSILLECTOMY      There were no vitals filed for this visit.  Subjective Assessment - 05/11/18 1300    Subjective  Pt. entered PT with use of rollator.  Pts. wife brought in single loftstrand crutch.      Limitations  Lifting;Standing;Walking;House hold activities    Patient Stated Goals  Improve LE strength/ gait and balance  with daily tasks.  Return to using cane.      Currently in Pain?  No/denies          Treatment:   There.ex.: Scifit L6 5 min.B UE/LE (no charge/ warm-up). Improved ability to maintain foot on pedal 5# standing hip ex. (all 4-planes).  Seated LAQ/ hip flexion/ heel raises 20x each. Reviewed HEP   Neuro: Hallway activities (head turning/ coordination hands-to-knees) with/without loftstrand crutch (SBA/CGA for safety). Static Stanceon stable surface4x30 sec Tandem Stance onstable surface4x30 sec Static Stance with eyes closed onstable surface4x30 sec SLS Stance with eyes closedon stable surface4x30 sec Standing on blue mat/ sit to stands from green chair  Gait Ambulate in hallway with single loftstrand working on 2-point gait pattern while maintaining proper BOS/ heel strike.   Ascend/ descend stairs with 1 UE assist and cuing to increase BOS.     PT Long Term Goals - 05/01/18 1243      PT LONG TERM GOAL #1   Title  Pt. independent with HEP to increase B hip flexion/ abduction strength 1/2 muscle grade to improve pain-free mobility.      Baseline  B quad/ hamtring strength grossly 5/5 MMT and B hip flexion/ abd. 4/5 MMT on R and 3+/5  MMT on L.    Time  4    Period  Weeks    Status  New    Target Date  05/24/18      PT LONG TERM GOAL #2   Title  Pt. will increase Berg balance test to >45 out of 56 to improve independence with gait/ decrease fall risk.      Baseline  Berg: 39/56    Time  4    Period  Weeks    Status  New    Target Date  05/24/18      PT LONG TERM GOAL #3   Title  Pt. able to ambulate 100 feet with consistent 2 point gait pattern and use of SPC to improve household mobility.     Baseline  Pt. currently using RW    Time  4    Period  Weeks    Status  New    Target Date  05/24/18      PT LONG TERM GOAL #4   Title  Pt. able to stand from normal chair with no UE assist to improve safety/independence with transfers.      Baseline  extra time and UE assist required to stand    Time  4    Period  Weeks    Status  New    Target Date  05/24/18            Plan - 05/11/18 1311    Clinical Impression Statement  Marked improvement with gait pattern/ control with use of single loftstrand while in hallway.  Pt. reports feeling unsteady/ fearful of falling but demonstrates consistent 2-point gait pattern with slight forward/ flexed head.  PT provided SBA/CGA during all aspects of gait and balance for verbal cuing.   Extra time/ focus with turning in clinic and standing on blue mat/ Airex pad.      Clinical Presentation  Evolving    Clinical Decision Making  Moderate    Rehab Potential  Fair    PT Frequency  2x / week    PT Duration  4 weeks    PT Treatment/Interventions  ADLs/Self Care Home Management;Therapeutic activities;Functional mobility training;Stair training;Gait training;Therapeutic exercise;Balance training;Neuromuscular re-education;Patient/family education;Manual techniques;Passive range of motion    PT Next Visit Plan  Progress balance training    PT Home Exercise Plan  See handouts       Patient will benefit from skilled therapeutic intervention in order to improve the following deficits and impairments:  Abnormal gait, Decreased balance, Decreased endurance, Decreased mobility, Difficulty walking, Decreased range of motion, Decreased activity tolerance, Decreased strength, Impaired flexibility, Postural dysfunction, Pain  Visit Diagnosis: Muscle weakness (generalized)  Gait difficulty  Abnormal posture     Problem List Patient Active Problem List   Diagnosis Date Noted  . 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 HLD (hyperlipidemia) 03/24/2018  . Contusion of right knee 02/14/2018  . Bradycardia 12/28/2017  . 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 Chronic pain of right knee 08/10/2016  . Right ankle pain 08/10/2016  . Venous insufficiency  of both lower extremities 05/13/2016  . 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[place]) 12/11/2015  . 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 Lymphoma, 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[place]) 04/03/2015  . Breathlessness on exertion 11/21/2014  . Breath shortness 11/21/2014  . Arthropathy 11/07/2014  . Atrial flutter, paroxysmal ([place]) 11/07/2014  . Type 2 diabetes mellitus ([place]) 11/07/2014  . Benign essential tremor 11/07/2014  . Benign essential HTN 11/07/2014  . Mixed incontinence 11/07/2014  .  Hypercholesterolemia without hypertriglyceridemia 11/07/2014  . Apnea, sleep 11/07/2014  . Controlled type 2 diabetes mellitus without complication ([place]) 48/35/0757  . Pure hypercholesterolemia 11/07/2014  . Other abnormalities of gait and mobility 11/02/2011  . Abnormal gait 06/29/2011  . Discoordination 06/29/2011   Pura Spice, PT, DPT # [id number] 05/11/2018, 2:39 PM  [facility] [address] [place], [place], [address] Phone: [phone]   Fax:  [phone]  Name: Brewer Hitchman MRN: [id number] Date of Birth: March 29, 1941

## 2018-05-11 NOTE — Therapy (Signed)
[facility] [address] [place], [place], [address] Phone: [phone]   Fax:  [phone]  Physical Therapy Treatment  Patient Details  Name: Mike Holloway MRN: [id number] Date of Birth: 1940-10-01 Referring Provider: Dr. Manson Passey   Encounter Date: 05/11/2018  PT End of Session - 05/11/18 1546    Visit Number  5    Number of Visits  8    Date for PT Re-Evaluation  05/24/18    PT Start Time  1432    PT Stop Time  1524    PT Time Calculation (min)  52 min    Equipment Utilized During Treatment  Gait belt    Activity Tolerance  Patient tolerated treatment well    Behavior During Therapy  [facility] for tasks assessed/performed       Past Medical History:  Diagnosis Date  . Arthritis   . Atrial flutter ([place])   . Diabetes mellitus ([place])   . Essential tremor   . Hypercholesteremia   . Hypertension   . Incontinence   . 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[place])    grew on the testical  . Sleep apnea   . Stroke [facility])     Past Surgical History:  Procedure Laterality Date  . ABLATION    . APPENDECTOMY    . CATARACT EXTRACTION, BILATERAL    . COLONOSCOPY WITH PROPOFOL N/A 03/17/2018   Procedure: COLONOSCOPY WITH PROPOFOL;  Surgeon: Jonathon Bellows, MD;  Location[facility] ENDOSCOPY;  Service: Gastroenterology;  Laterality: N/A;  . DEEP BRAIN STIMULATOR PLACEMENT    . 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 HEMORRHOID SURGERY    . HERNIA REPAIR    . NASAL SINUS SURGERY    . ORCHIECTOMY    . TONSILLECTOMY      There were no vitals filed for this visit.  Subjective Assessment - 05/11/18 1550    Subjective  Pt. reports that his eyes have been troubling and he has been unable to keep them open for prolonged periods of time.  Pt. entered with lofstrand crutch today instead of rollator.    Limitations   Lifting;Standing;Walking;House hold activities    Patient Stated Goals  Improve LE strength/ gait and balance with daily tasks.  Return to using cane.      Currently in Pain?  No/denies         Treatment:   There.ex.: Scifit L6 5 min. After sessionB UE/LE (no charge). Standing hip exercises(all planes)-5#             Pt. Needed extended rest break in middle of standing exercises due to fatigue. Walking over steps in // bars x6 Side Stepping over steps in //  Bars x4   Neuro: Hallway activities (head turning/post-it notes recall) with no AD and modA and CGA for safety multiple bouts. Hallway walking with 5# weights with verbal cueing for large, exaggerated hip flexion, multiple bouts       PT Long Term Goals - 05/01/18 1243      PT LONG TERM GOAL #1   Title  Pt. independent with HEP to increase B hip flexion/ abduction strength 1/2 muscle grade to improve pain-free mobility.      Baseline  B quad/ hamtring strength grossly 5/5 MMT and B hip flexion/ abd. 4/5 MMT on R and 3+/5 MMT on L.    Time  4  Period  Weeks    Status  New    Target Date  05/24/18      PT LONG TERM GOAL #2   Title  Pt. will increase Berg balance test to >45 out of 56 to improve independence with gait/ decrease fall risk.      Baseline  Berg: 39/56    Time  4    Period  Weeks    Status  New    Target Date  05/24/18      PT LONG TERM GOAL #3   Title  Pt. able to ambulate 100 feet with consistent 2 point gait pattern and use of SPC to improve household mobility.     Baseline  Pt. currently using RW    Time  4    Period  Weeks    Status  New    Target Date  05/24/18      PT LONG TERM GOAL #4   Title  Pt. able to stand from normal chair with no UE assist to improve safety/independence with transfers.     Baseline  extra time and UE assist required to stand    Time  4    Period  Weeks    Status  New    Target Date  05/24/18            Plan - 05/11/18 1547    Clinical  Impression Statement  Pt. continues to perform well with lofstrand crutch, and was able to perform all ther ex and neuro-based activities without need of rollator.  Pt. had difficulty performing certain exercises due to eyes burning and had to perform standing ther ex with eyes closed a majority of the time.  Pt. performed well, and maintained a safe upright position with the use of UE's on // bars.  Pt. also turned on DBS to prevent tremor.  Pt's. wife assisted during standing exercises for hand hold pressure.  Pt. will continue with neuro and strengtehning exercises at next visit.    Clinical Presentation  Evolving    Clinical Decision Making  Moderate    Rehab Potential  Fair    PT Frequency  2x / week    PT Duration  4 weeks    PT Treatment/Interventions  ADLs/Self Care Home Management;Therapeutic activities;Functional mobility training;Stair training;Gait training;Therapeutic exercise;Balance training;Neuromuscular re-education;Patient/family education;Manual techniques;Passive range of motion    PT Next Visit Plan  Progress balance training    PT Home Exercise Plan  See handouts       Patient will benefit from skilled therapeutic intervention in order to improve the following deficits and impairments:  Abnormal gait, Decreased balance, Decreased endurance, Decreased mobility, Difficulty walking, Decreased range of motion, Decreased activity tolerance, Decreased strength, Impaired flexibility, Postural dysfunction, Pain  Visit Diagnosis: Muscle weakness (generalized)  Gait difficulty  Abnormal posture     Problem List Patient Active Problem List   Diagnosis Date Noted  . 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 HLD (hyperlipidemia) 03/24/2018  . Contusion of right knee 02/14/2018  . Bradycardia 12/28/2017  . 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 Chronic pain of right knee 08/10/2016  . Right ankle pain 08/10/2016  . Venous insufficiency  of both lower extremities 05/13/2016  . 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[place]) 12/11/2015  . 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 Lymphoma, 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[place]) 04/03/2015  . Breathlessness on exertion 11/21/2014  . Breath shortness 11/21/2014  . Arthropathy 11/07/2014  . Atrial flutter, paroxysmal ([place]) 11/07/2014  . Type 2 diabetes  mellitus ([place]) 11/07/2014  . Benign essential tremor 11/07/2014  . Benign essential HTN 11/07/2014  . Mixed incontinence 11/07/2014  . Hypercholesterolemia without hypertriglyceridemia 11/07/2014  . Apnea, sleep 11/07/2014  . Controlled type 2 diabetes mellitus without complication ([place]) 82/99/3716  . Pure hypercholesterolemia 11/07/2014  . Other abnormalities of gait and mobility 11/02/2011  . Abnormal gait 06/29/2011  . Discoordination 06/29/2011   Pura Spice, PT, DPT # [id number] Gwenlyn Saran, SPT 05/11/2018, 3:51 PM  [facility] [facility] [address] [place], [place], [address] Phone: [phone]   Fax:  [phone]  Name: Meagan Spease MRN: [id number] Date of Birth: 06-08-1941

## 2018-05-15 ENCOUNTER — Encounter: Payer: Self-pay | Admitting: Physical Therapy

## 2018-05-15 ENCOUNTER — Ambulatory Visit: Payer: Medicare Other | Admitting: Physical Therapy

## 2018-05-15 DIAGNOSIS — R269 Unspecified abnormalities of gait and mobility: Secondary | ICD-10-CM

## 2018-05-15 DIAGNOSIS — M6281 Muscle weakness (generalized): Secondary | ICD-10-CM

## 2018-05-15 DIAGNOSIS — R293 Abnormal posture: Secondary | ICD-10-CM

## 2018-05-15 NOTE — Therapy (Signed)
[facility] [address] [place], [place], [address] Phone: [phone]   Fax:  [phone]  Physical Therapy Treatment  Patient Details  Name: Mike Holloway MRN: [id number] Date of Birth: [birth year] Referring Provider: Dr. Manson Passey   Encounter Date: 05/15/2018  PT End of Session - 05/15/18 1712    Visit Number  6    Number of Visits  8    Date for PT Re-Evaluation  05/24/18    PT Start Time  4580    PT Stop Time  1532    PT Time Calculation (min)  64 min    Equipment Utilized During Treatment  Gait belt    Activity Tolerance  Patient tolerated treatment well    Behavior During Therapy  [facility] for tasks assessed/performed       Past Medical History:  Diagnosis Date  . Arthritis   . Atrial flutter ([place])   . Diabetes mellitus ([place])   . Essential tremor   . Hypercholesteremia   . Hypertension   . Incontinence   . 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[place])    grew on the testical  . Sleep apnea   . Stroke [facility])     Past Surgical History:  Procedure Laterality Date  . ABLATION    . APPENDECTOMY    . CATARACT EXTRACTION, BILATERAL    . COLONOSCOPY WITH PROPOFOL N/A 03/17/2018   Procedure: COLONOSCOPY WITH PROPOFOL;  Surgeon: Jonathon Bellows, MD;  Location: [facility] ENDOSCOPY;  Service: Gastroenterology;  Laterality: N/A;  . DEEP BRAIN STIMULATOR PLACEMENT    . 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 HEMORRHOID SURGERY    . HERNIA REPAIR    . NASAL SINUS SURGERY    . ORCHIECTOMY    . TONSILLECTOMY      There were no vitals filed for this visit.  Subjective Assessment - 05/15/18 1711    Subjective  Pt's. eyes have been better today, and they have made an eye appointment to see if anything can be done.  Pt. is blinking a lot less and is ambulating well with the lofstrand crutch.    Limitations   Lifting;Standing;Walking;House hold activities    Patient Stated Goals  Improve LE strength/ gait and balance with daily tasks.  Return to using cane.      Currently in Pain?  No/denies        Treatment  Neuro:   Hallway Obstacle Course (step ups, cone toe taps, Airex pad step ups, squatting to pick up/put down tennis ball) x4  Pt. Had 2 episodes with LOB that he was able to self-correct during cone taps. Wobble-board Clockwise/Counter-clockwise/Front-to-Back/Lateral x10 each   There Ex:  Resisted walking in // bars, one UE for support, minA./CGA with 2 BlackTB (Front/Back/Lateral) x5 each  Pt. Instructed to stand at end range of resistance and not use UE support Ambulating around gym x4 with instructions on high knees, heel-to-toe strike with use of lofstrand crutch and CGA SciFit Level 6 - 5 min (R knee was uncomfortable, warm-up/ not billed)    PT Long Term Goals - 05/01/18 1243      PT LONG TERM GOAL #1   Title  Pt. independent with HEP to increase B hip flexion/ abduction strength 1/2 muscle grade to improve pain-free mobility.      Baseline  B quad/ hamtring strength grossly 5/5 MMT and B hip flexion/  abd. 4/5 MMT on R and 3+/5 MMT on L.    Time  4    Period  Weeks    Status  New    Target Date  05/24/18      PT LONG TERM GOAL #2   Title  Pt. will increase Berg balance test to >45 out of 56 to improve independence with gait/ decrease fall risk.      Baseline  Berg: 39/56    Time  4    Period  Weeks    Status  New    Target Date  05/24/18      PT LONG TERM GOAL #3   Title  Pt. able to ambulate 100 feet with consistent 2 point gait pattern and use of SPC to improve household mobility.     Baseline  Pt. currently using RW    Time  4    Period  Weeks    Status  New    Target Date  05/24/18      PT LONG TERM GOAL #4   Title  Pt. able to stand from normal chair with no UE assist to improve safety/independence with transfers.     Baseline  extra time and UE assist  required to stand    Time  4    Period  Weeks    Status  New    Target Date  05/24/18            Plan - 05/15/18 1712    Clinical Impression Statement  Pt. performed well with the obstacle course in the hallway with use of loftstrand crutch with 2 episodes of LOB that pt. was able to self-correct.  Pt. required frequent rest breaks with resisted walking.  Pt. experienced difficulty with resisted walking in // bars with one UE support on bar.  Pt. required minA/CGA in order to prevent falling towards direction of resistance at times, specifically at end range of resistance where he was able to stand for <5 seconds without any UE support.  Pt. will continue to benefit from strengthening exercises at next visit coupled with balance exercises for proprioception.    Clinical Presentation  Evolving    Clinical Decision Making  Moderate    Rehab Potential  Fair    PT Frequency  2x / week    PT Duration  4 weeks    PT Treatment/Interventions  ADLs/Self Care Home Management;Therapeutic activities;Functional mobility training;Stair training;Gait training;Therapeutic exercise;Balance training;Neuromuscular re-education;Patient/family education;Manual techniques;Passive range of motion    PT Next Visit Plan  Progress balance training, including hip strengthening exercises.    PT Home Exercise Plan  See handouts       Patient will benefit from skilled therapeutic intervention in order to improve the following deficits and impairments:  Abnormal gait, Decreased balance, Decreased endurance, Decreased mobility, Difficulty walking, Decreased range of motion, Decreased activity tolerance, Decreased strength, Impaired flexibility, Postural dysfunction, Pain  Visit Diagnosis: Muscle weakness (generalized)  Gait difficulty  Abnormal posture     Problem List Patient Active Problem List   Diagnosis Date Noted  . 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 HLD (hyperlipidemia)  03/24/2018  . Contusion of right knee 02/14/2018  . Bradycardia 12/28/2017  . 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 Chronic pain of right knee 08/10/2016  . Right ankle pain 08/10/2016  . Venous insufficiency of both lower extremities 05/13/2016  . 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[place]) 12/11/2015  . 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 Lymphoma, 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[place]) 04/03/2015  .  Breathlessness on exertion 11/21/2014  . Breath shortness 11/21/2014  . Arthropathy 11/07/2014  . Atrial flutter, paroxysmal ([place]) 11/07/2014  . Type 2 diabetes mellitus ([place]) 11/07/2014  . Benign essential tremor 11/07/2014  . Benign essential HTN 11/07/2014  . Mixed incontinence 11/07/2014  . Hypercholesterolemia without hypertriglyceridemia 11/07/2014  . Apnea, sleep 11/07/2014  . Controlled type 2 diabetes mellitus without complication ([place]) 76/19/5093  . Pure hypercholesterolemia 11/07/2014  . Other abnormalities of gait and mobility 11/02/2011  . Abnormal gait 06/29/2011  . Discoordination 06/29/2011   Pura Spice, PT, DPT # [id number] Gwenlyn Saran, SPT 05/15/2018, 5:25 PM  [facility] [address] [place], [place], [address] Phone: [phone]   Fax:  [phone]  Name: Mike Holloway MRN: [id number] Date of Birth: 1940/12/09

## 2018-05-17 ENCOUNTER — Encounter: Payer: Self-pay | Admitting: Physical Therapy

## 2018-05-17 ENCOUNTER — Ambulatory Visit: Payer: Medicare Other | Admitting: Physical Therapy

## 2018-05-17 DIAGNOSIS — R293 Abnormal posture: Secondary | ICD-10-CM

## 2018-05-17 DIAGNOSIS — M6281 Muscle weakness (generalized): Secondary | ICD-10-CM | POA: Diagnosis not present

## 2018-05-17 DIAGNOSIS — R269 Unspecified abnormalities of gait and mobility: Secondary | ICD-10-CM

## 2018-05-17 NOTE — Therapy (Signed)
[facility] [address] [place], [place], [address] Phone: [phone]   Fax:  [phone]  Physical Therapy Treatment  Patient Details  Name: Mike Holloway MRN: [id number] Date of Birth: 1940/10/01 Referring Provider: Dr. Manson Passey   Encounter Date: 05/17/2018  PT End of Session - 05/17/18 1616    Visit Number  7    Number of Visits  8    Date for PT Re-Evaluation  05/24/18    PT Start Time  1425    PT Stop Time  1516    PT Time Calculation (min)  51 min    Equipment Utilized During Treatment  Gait belt    Activity Tolerance  Patient tolerated treatment well    Behavior During Therapy  [facility] for tasks assessed/performed       Past Medical History:  Diagnosis Date  . Arthritis   . Atrial flutter ([place])   . Diabetes mellitus ([place])   . Essential tremor   . Hypercholesteremia   . Hypertension   . Incontinence   . 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 Sleep apnea   . Stroke [facility])     Past Surgical History:  Procedure Laterality Date  . ABLATION    . APPENDECTOMY    . CATARACT EXTRACTION, BILATERAL    . COLONOSCOPY WITH PROPOFOL N/A 03/17/2018   Procedure: COLONOSCOPY WITH PROPOFOL;  Surgeon: Jonathon Bellows, MD;  Location: [facility] ENDOSCOPY;  Service: Gastroenterology;  Laterality: N/A;  . DEEP BRAIN STIMULATOR PLACEMENT    . 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 HEMORRHOID SURGERY    . HERNIA REPAIR    . NASAL SINUS SURGERY    . ORCHIECTOMY    . TONSILLECTOMY      There were no vitals filed for this visit.  Subjective Assessment - 05/17/18 1614    Subjective  Pt. and wife reports that eyes are much better today after seeing eye doctor and being prescribed eye-drop antibiotics.  Pt. is in good spirits today and seems to be in a very good mood upon entering into the clinic.     Limitations  Lifting;Standing;Walking;House hold activities    Patient Stated Goals  Improve LE strength/ gait and balance with daily tasks.  Return to using cane.      Currently in Pain?  No/denies    Multiple Pain Sites  No           Treatment  Neuro:   Hallway Obstacle Course (step ups, cone toe taps, Airex pad step ups, squatting to pick up/put down tennis ball, cone weaves) x4             Pt. Had no episodes of LOB and only knocked over 2 cones throughout process. Large, Exaggerated movements of UE, LE in // bars with loud counting towards final sets 6x15  Pt. Responded well and only one LOB was noticed that required external correction from SPT.    Pt. Wore 5# weights while performing movements.      PT Long Term Goals - 05/01/18 1243      PT LONG TERM GOAL #1   Title  Pt. independent with HEP to increase B hip flexion/ abduction strength 1/2 muscle grade to improve pain-free mobility.      Baseline  B quad/ hamtring strength grossly 5/5 MMT and B hip  flexion/ abd. 4/5 MMT on R and 3+/5 MMT on L.    Time  4    Period  Weeks    Status  New    Target Date  05/24/18      PT LONG TERM GOAL #2   Title  Pt. will increase Berg balance test to >45 out of 56 to improve independence with gait/ decrease fall risk.      Baseline  Berg: 39/56    Time  4    Period  Weeks    Status  New    Target Date  05/24/18      PT LONG TERM GOAL #3   Title  Pt. able to ambulate 100 feet with consistent 2 point gait pattern and use of SPC to improve household mobility.     Baseline  Pt. currently using RW    Time  4    Period  Weeks    Status  New    Target Date  05/24/18      PT LONG TERM GOAL #4   Title  Pt. able to stand from normal chair with no UE assist to improve safety/independence with transfers.     Baseline  extra time and UE assist required to stand    Time  4    Period  Weeks    Status  New    Target Date  05/24/18            Plan - 05/17/18 1616     Clinical Impression Statement  Pt. was introduced to performing large, exaggerated movements inside the // bars today.  Pt. performed well and was able to perform them with only one LOB that needed external correction.  Pt. included loud counts during final stage of movements, to which the wife was pleased to hear.  Pt. overall had improved performance today and is making significant progress.  Pt. will be re-evaluated on goal progress in upcoming visits.    Clinical Presentation  Evolving    Clinical Decision Making  Moderate    Rehab Potential  Fair    PT Frequency  2x / week    PT Duration  4 weeks    PT Treatment/Interventions  ADLs/Self Care Home Management;Therapeutic activities;Functional mobility training;Stair training;Gait training;Therapeutic exercise;Balance training;Neuromuscular re-education;Patient/family education;Manual techniques;Passive range of motion    PT Next Visit Plan  RE-CERT    PT Home Exercise Plan  See handouts       Patient will benefit from skilled therapeutic intervention in order to improve the following deficits and impairments:  Abnormal gait, Decreased balance, Decreased endurance, Decreased mobility, Difficulty walking, Decreased range of motion, Decreased activity tolerance, Decreased strength, Impaired flexibility, Postural dysfunction, Pain  Visit Diagnosis: Muscle weakness (generalized)  Gait difficulty  Abnormal posture     Problem List Patient Active Problem List   Diagnosis Date Noted  . 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 HLD (hyperlipidemia) 03/24/2018  . Contusion of right knee 02/14/2018  . Bradycardia 12/28/2017  . 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 Chronic pain of right knee 08/10/2016  . Right ankle pain 08/10/2016  . Venous insufficiency of both lower extremities 05/13/2016  . 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[place]) 12/11/2015  . 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 Lymphoma, 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[place])  04/03/2015  . Breathlessness on exertion 11/21/2014  . Breath shortness 11/21/2014  . Arthropathy 11/07/2014  . Atrial flutter, paroxysmal ([place]) 11/07/2014  . Type 2 diabetes mellitus ([place]) 11/07/2014  . Benign essential tremor 11/07/2014  .  Benign essential HTN 11/07/2014  . Mixed incontinence 11/07/2014  . Hypercholesterolemia without hypertriglyceridemia 11/07/2014  . Apnea, sleep 11/07/2014  . Controlled type 2 diabetes mellitus without complication ([place]) 97/35/3299  . Pure hypercholesterolemia 11/07/2014  . Other abnormalities of gait and mobility 11/02/2011  . Abnormal gait 06/29/2011  . Discoordination 06/29/2011   Pura Spice, PT, DPT # [id number] Gwenlyn Saran, SPT 05/17/2018, 4:21 PM   [facility] [address] [place], [place], [address] Phone: [phone]   Fax:  [phone]  Name: Tukker Byrns MRN: [id number] Date of Birth: Oct 14, 1940

## 2018-05-22 ENCOUNTER — Ambulatory Visit: Payer: Medicare Other | Admitting: Physical Therapy

## 2018-05-22 ENCOUNTER — Encounter: Payer: Self-pay | Admitting: Physical Therapy

## 2018-05-22 DIAGNOSIS — M6281 Muscle weakness (generalized): Secondary | ICD-10-CM | POA: Diagnosis not present

## 2018-05-22 DIAGNOSIS — R269 Unspecified abnormalities of gait and mobility: Secondary | ICD-10-CM

## 2018-05-22 DIAGNOSIS — R293 Abnormal posture: Secondary | ICD-10-CM

## 2018-05-22 NOTE — Therapy (Signed)
[facility] [address] [place], [place], [address] Phone: [phone]   Fax:  [phone]  Physical Therapy Treatment  Patient Details  Name: Mike Holloway MRN: [id number] Date of Birth: 1940/10/18 Referring Provider: Dr. Manson Passey   Encounter Date: 05/22/2018  PT End of Session - 05/22/18 1902    Visit Number  8    Number of Visits  8    Date for PT Re-Evaluation  05/24/18    PT Start Time  1427    PT Stop Time  1519    PT Time Calculation (min)  52 min    Equipment Utilized During Treatment  Gait belt    Activity Tolerance  Patient tolerated treatment well    Behavior During Therapy  [facility] for tasks assessed/performed       Past Medical History:  Diagnosis Date  . Arthritis   . Atrial flutter ([place])   . Diabetes mellitus ([place])   . Essential tremor   . Hypercholesteremia   . Hypertension   . Incontinence   . 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[place])    grew on the testical  . Sleep apnea   . Stroke [facility])     Past Surgical History:  Procedure Laterality Date  . ABLATION    . APPENDECTOMY    . CATARACT EXTRACTION, BILATERAL    . COLONOSCOPY WITH PROPOFOL N/A 03/17/2018   Procedure: COLONOSCOPY WITH PROPOFOL;  Surgeon: Jonathon Bellows, MD;  Location: [facility] ENDOSCOPY;  Service: Gastroenterology;  Laterality: N/A;  . DEEP BRAIN STIMULATOR PLACEMENT    . 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 HEMORRHOID SURGERY    . HERNIA REPAIR    . NASAL SINUS SURGERY    . ORCHIECTOMY    . TONSILLECTOMY      There were no vitals filed for this visit.  Subjective Assessment - 05/22/18 1451    Subjective  Pt. and wife reports that eyes were an issue Thursday afternoon and Friday, and consistently got better as time progressed.  Pts. wife discussed that pt. is still using RW at home and was PT to talk with him about  using cane.      Limitations  Lifting;Standing;Walking;House hold activities    Patient Stated Goals  Improve LE strength/ gait and balance with daily tasks.  Return to using cane.      Currently in Pain?  No/denies    Multiple Pain Sites  No         Treatment:  Neuro:  Large, Exaggerated movements of UE, LE in // bars with loud counting 6x15 Lateral Steps on Airex Balance Beam x8 (UE light touch) Walking on Airex Balance Beam x8 (UE light touch)  There Ex:  Standing B Hip Marches 2x15 each Standing B Hip Extension 2x15 each Standing B Hip Abduction 2x15 each Lateral Side Steps in // bars x8  All standing exercises performed with 5# cuff weight      PT Education - 05/22/18 1901    Education Details  Pt. encouraged and educated on using lofstrand crutch while walking around house.    Person(s) Educated  Patient;Spouse    Methods  Explanation    Comprehension  Verbalized understanding          PT Long Term Goals - 05/01/18 1243      PT LONG TERM GOAL #1   Title  Pt. independent  with HEP to increase B hip flexion/ abduction strength 1/2 muscle grade to improve pain-free mobility.      Baseline  B quad/ hamtring strength grossly 5/5 MMT and B hip flexion/ abd. 4/5 MMT on R and 3+/5 MMT on L.    Time  4    Period  Weeks    Status  New    Target Date  05/24/18      PT LONG TERM GOAL #2   Title  Pt. will increase Berg balance test to >45 out of 56 to improve independence with gait/ decrease fall risk.      Baseline  Berg: 39/56    Time  4    Period  Weeks    Status  New    Target Date  05/24/18      PT LONG TERM GOAL #3   Title  Pt. able to ambulate 100 feet with consistent 2 point gait pattern and use of SPC to improve household mobility.     Baseline  Pt. currently using RW    Time  4    Period  Weeks    Status  New    Target Date  05/24/18      PT LONG TERM GOAL #4   Title  Pt. able to stand from normal chair with no UE assist to improve  safety/independence with transfers.     Baseline  extra time and UE assist required to stand    Time  4    Period  Weeks    Status  New    Target Date  05/24/18          Plan - 05/22/18 1902    Clinical Impression Statement  Pt. is progressing well with large, exaggerated movements inside the // bars.  Pt. is notedly having decreased sense of LOB when performing exercises and is making vocal counts much more clear.  Pt. will continue to improve using this technique coupled with strengthening program at home.    Clinical Presentation  Evolving    Clinical Decision Making  Moderate    Rehab Potential  Fair    PT Frequency  2x / week    PT Duration  4 weeks    PT Treatment/Interventions  ADLs/Self Care Home Management;Therapeutic activities;Functional mobility training;Stair training;Gait training;Therapeutic exercise;Balance training;Neuromuscular re-education;Patient/family education;Manual techniques;Passive range of motion    PT Next Visit Plan  RE-CERT    PT Home Exercise Plan  See handouts       Patient will benefit from skilled therapeutic intervention in order to improve the following deficits and impairments:  Abnormal gait, Decreased balance, Decreased endurance, Decreased mobility, Difficulty walking, Decreased range of motion, Decreased activity tolerance, Decreased strength, Impaired flexibility, Postural dysfunction, Pain  Visit Diagnosis: Muscle weakness (generalized)  Gait difficulty  Abnormal posture     Problem List Patient Active Problem List   Diagnosis Date Noted  . 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 HLD (hyperlipidemia) 03/24/2018  . Contusion of right knee 02/14/2018  . Bradycardia 12/28/2017  . 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 Chronic pain of right knee 08/10/2016  . Right ankle pain 08/10/2016  . Venous insufficiency of both lower extremities 05/13/2016  . 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[place]) 12/11/2015   . 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 Lymphoma, 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[place]) 04/03/2015  . Breathlessness on exertion 11/21/2014  . Breath shortness 11/21/2014  . Arthropathy 11/07/2014  . Atrial flutter, paroxysmal ([place]) 11/07/2014  . Type 2 diabetes mellitus ([place]) 11/07/2014  .  Benign essential tremor 11/07/2014  . Benign essential HTN 11/07/2014  . Mixed incontinence 11/07/2014  . Hypercholesterolemia without hypertriglyceridemia 11/07/2014  . Apnea, sleep 11/07/2014  . Controlled type 2 diabetes mellitus without complication ([place]) 72/05/1979  . Pure hypercholesterolemia 11/07/2014  . Other abnormalities of gait and mobility 11/02/2011  . Abnormal gait 06/29/2011  . Discoordination 06/29/2011   Pura Spice, PT, DPT # [id number] Gwenlyn Saran, SPT 05/22/2018, 7:10 PM  [facility] [address] [place], [place], [address] Phone: [phone]   Fax:  [phone]  Name: Zahki Hoogendoorn MRN: [id number] Date of Birth: 06/16/1941

## 2018-05-24 ENCOUNTER — Ambulatory Visit: Payer: Medicare Other | Admitting: Physical Therapy

## 2018-05-24 DIAGNOSIS — M6281 Muscle weakness (generalized): Secondary | ICD-10-CM

## 2018-05-24 DIAGNOSIS — R293 Abnormal posture: Secondary | ICD-10-CM

## 2018-05-24 DIAGNOSIS — R269 Unspecified abnormalities of gait and mobility: Secondary | ICD-10-CM

## 2018-05-26 NOTE — Therapy (Addendum)
[facility] [address] [place], [place], [address] Phone: [phone]   Fax:  [phone]  Physical Therapy Treatment/Progress Note Dates of reporting 04/26/2018 to 05/24/2018  Patient Details  Name: Mike Holloway MRN: [id number] Date of Birth: 08/23/1941 Referring Provider: Dr. Manson Passey   Encounter Date: 05/24/2018  PT End of Session - 05/30/18 1241    Visit Number  9    Number of Visits  17    Date for PT Re-Evaluation  06/21/18    PT Start Time  1431    PT Stop Time  1527    PT Time Calculation (min)  56 min    Equipment Utilized During Treatment  Gait belt    Activity Tolerance  Patient tolerated treatment well    Behavior During Therapy  [facility] for tasks assessed/performed       Past Medical History:  Diagnosis Date  . Arthritis   . Atrial flutter ([place])   . Diabetes mellitus ([place])   . Essential tremor   . Hypercholesteremia   . Hypertension   . Incontinence   . 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[place])    grew on the testical  . Sleep apnea   . Stroke [facility])     Past Surgical History:  Procedure Laterality Date  . ABLATION    . APPENDECTOMY    . CATARACT EXTRACTION, BILATERAL    . COLONOSCOPY WITH PROPOFOL N/A 03/17/2018   Procedure: COLONOSCOPY WITH PROPOFOL;  Surgeon: Jonathon Bellows, MD;  Location: [facility] ENDOSCOPY;  Service: Gastroenterology;  Laterality: N/A;  . DEEP BRAIN STIMULATOR PLACEMENT    . 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 HEMORRHOID SURGERY    . HERNIA REPAIR    . NASAL SINUS SURGERY    . ORCHIECTOMY    . TONSILLECTOMY      There were no vitals filed for this visit.      [facility] PT Assessment - 05/30/18 0001      Assessment   Medical Diagnosis  Essential Tremor/ Abnormality of gait and mobility.      Referring Provider  Dr. Manson Passey    Onset Date/Surgical Date   01/10/18      Prior Function   Level of Independence  Requires assistive device for independence         Pt. and wife report that he has been using lofstrand crutch at home much more consistently. Pt. reports he does not feel as comfortable using it but has not had any episodes of LOB around home. Pt. reports eyes are still a reoccurring issue and that they will be visiting another MD for the issue.         Treatment:  Neuro:  Lateral Steps on Airex Balance Beam x8 (UE light touch) Walking on Airex Balance Beam x8 (UE light touch) Large movement patterns in //-bars with mirror feedback. See handouts  There Ex: Standing B Hip Marches 2x15 each Standing B Hip Extension 2x15 each Standing B Hip Abduction 2x15 each Lateral Side Steps in // bars x8  All standing exercises performed with 5# cuff weight   Scifit L6 B UE/LE 10 min.           Pt. has made significant progress towards goals and has made improvements in strength of B LE. Pt. still requires assistance from hands in order to rise from seated position however has made  improvements in form and technique. Pt. will continue to benefit from skilled therapy for balance deficits and strengthening in order to decrease risk of falls and improve QoL.      PT Long Term Goals - 05/24/18 1437      PT LONG TERM GOAL #1   Title  Pt. independent with HEP to increase B hip flexion/ abduction strength 1/2 muscle grade to improve pain-free mobility.      Baseline  B quad/ hamtring strength grossly 5/5 MMT and B hip flexion/ abd. 4/5 MMT on R and 3+/5 MMT on L.; B quad/ hamtring strength grossly 5/5 MMT and B hip flexion/ abd. 4/5 MMT on R and 5/5 MMT on L    Time  4    Period  Weeks    Status  Achieved    Target Date  05/24/18      PT LONG TERM GOAL #2   Title  Pt. will increase Berg balance test to >45 out of 56 to improve independence with gait/ decrease fall risk.      Baseline  Berg: 39/56; 8/28 Berg: 43/56     Time  4    Period  Weeks    Status  Partially Met    Target Date  05/24/18      PT LONG TERM GOAL #3   Title  Pt. able to ambulate 100 feet with consistent 2 point gait pattern and use of SPC to improve household mobility.     Baseline  Pt. currently using RW; 8/28 consistent with 2 point gait patter with use of lofstrand crutch.    Time  4    Period  Weeks    Status  Achieved    Target Date  05/24/18      PT LONG TERM GOAL #4   Title  Pt. able to stand from normal chair with no UE assist to improve safety/independence with transfers.     Baseline  extra time and UE assist required to stand; 8/28 needs multiple attempts to rise form chair with min use of UE on legs.    Time  4    Period  Weeks    Status  Partially Met    Target Date  05/24/18         Patient will benefit from skilled therapeutic intervention in order to improve the following deficits and impairments:  Abnormal gait, Decreased balance, Decreased endurance, Decreased mobility, Difficulty walking, Decreased range of motion, Decreased activity tolerance, Decreased strength, Impaired flexibility, Postural dysfunction, Pain  Visit Diagnosis: Muscle weakness (generalized)  Gait difficulty  Abnormal posture     Problem List Patient Active Problem List   Diagnosis Date Noted  . 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 HLD (hyperlipidemia) 03/24/2018  . Contusion of right knee 02/14/2018  . Bradycardia 12/28/2017  . 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 Chronic pain of right knee 08/10/2016  . Right ankle pain 08/10/2016  . Venous insufficiency of both lower extremities 05/13/2016  . 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[place]) 12/11/2015  . 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 Lymphoma, 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[place]) 04/03/2015  . Breathlessness on exertion 11/21/2014  . Breath shortness 11/21/2014  . Arthropathy 11/07/2014  . Atrial flutter, paroxysmal ([place]) 11/07/2014  . Type 2 diabetes  mellitus ([place]) 11/07/2014  . Benign essential tremor 11/07/2014  . Benign essential HTN 11/07/2014  . Mixed incontinence 11/07/2014  . Hypercholesterolemia without hypertriglyceridemia 11/07/2014  . Apnea, sleep 11/07/2014  . Controlled type 2 diabetes mellitus without complication (  [place]) 11/07/2014  . Pure hypercholesterolemia 11/07/2014  . Other abnormalities of gait and mobility 11/02/2011  . Abnormal gait 06/29/2011  . Discoordination 06/29/2011   Pura Spice, PT, DPT # [phone] 05/30/2018, 12:43 PM  [facility] [address] [place], [place], [address] Phone: [phone]   Fax:  [phone]  Name: Mike Holloway MRN: [id number] Date of Birth: 1941-04-06

## 2018-05-30 ENCOUNTER — Ambulatory Visit: Payer: Medicare Other | Attending: Neurosurgery | Admitting: Physical Therapy

## 2018-05-30 ENCOUNTER — Encounter: Payer: Self-pay | Admitting: Physical Therapy

## 2018-05-30 DIAGNOSIS — R293 Abnormal posture: Secondary | ICD-10-CM | POA: Insufficient documentation

## 2018-05-30 DIAGNOSIS — R269 Unspecified abnormalities of gait and mobility: Secondary | ICD-10-CM

## 2018-05-30 DIAGNOSIS — M6281 Muscle weakness (generalized): Secondary | ICD-10-CM | POA: Diagnosis not present

## 2018-05-30 NOTE — Therapy (Signed)
[facility] [address] [place], [place], [address] Phone: [phone]   Fax:  [phone]  Physical Therapy Treatment  Patient Details  Name: Mike Holloway MRN: [id number] Date of Birth: Aug 27, 1941 Referring Provider: Dr. Manson Passey   Encounter Date: 05/30/2018  PT End of Session - 05/30/18 1307    Visit Number  10    Number of Visits  17    Date for PT Re-Evaluation  06/21/18    PT Start Time  1301    PT Stop Time  1356    PT Time Calculation (min)  55 min    Equipment Utilized During Treatment  Gait belt    Activity Tolerance  Patient tolerated treatment well    Behavior During Therapy  [facility] for tasks assessed/performed       Past Medical History:  Diagnosis Date  . Arthritis   . Atrial flutter ([place])   . Diabetes mellitus ([place])   . Essential tremor   . Hypercholesteremia   . Hypertension   . Incontinence   . 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 Sleep apnea   . Stroke [facility])     Past Surgical History:  Procedure Laterality Date  . ABLATION    . APPENDECTOMY    . CATARACT EXTRACTION, BILATERAL    . COLONOSCOPY WITH PROPOFOL N/A 03/17/2018   Procedure: COLONOSCOPY WITH PROPOFOL;  Surgeon: Jonathon Bellows, MD;  Location: [facility] ENDOSCOPY;  Service: Gastroenterology;  Laterality: N/A;  . DEEP BRAIN STIMULATOR PLACEMENT    . 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 HEMORRHOID SURGERY    . HERNIA REPAIR    . NASAL SINUS SURGERY    . ORCHIECTOMY    . TONSILLECTOMY      There were no vitals filed for this visit.  Subjective Assessment - 05/30/18 1303    Subjective  Pt. reports that Friday his eyes were much better than they have been in the past couple of days.  Pt. reports he was fatigued after last visit on Thursday and went home and immediately went to bed.  Pt. is seeing  eye doctor today with hopeful results.    Limitations  Lifting;Standing;Walking;House hold activities    Patient Stated Goals  Improve LE strength/ gait and balance with daily tasks.  Return to using cane.      Currently in Pain?  Yes    Pain Score  4     Pain Location  Knee    Pain Orientation  Right    Pain Descriptors / Indicators  Aching    Pain Type  Acute pain    Pain Onset  1 to 4 weeks ago    Pain Frequency  Intermittent    Aggravating Factors   Walking on Leg    Pain Relieving Factors  Ice pack    Multiple Pain Sites  No           Treatment:  Neuro:  Static Stance on Airex 4x30 sec Narrow BOS on Airex 4x30 sec Tandem Stance on Airex 4x30 sec each Static Stance with eyes closed on Airex 4x30 sec Narrow BOS on Airex 4x30 sec Tandem Stance with eyes closed on Airex 4x30 sec each     There Ex:  Standing B Hip Marches 2x15 each Standing B Hip Extension 2x15 each Standing B Hip Abduction 2x15  each Lateral Side Steps in // bars x8  Allstandingexercises performed with 6# cuff weight   Pt. Had difficulty with exercises due to eye being shut for prolonged periods of time.  Pt. also experienced difficulty due to R knee pain as well.     PT Long Term Goals - 05/24/18 1437      PT LONG TERM GOAL #1   Title  Pt. independent with HEP to increase B hip flexion/ abduction strength 1/2 muscle grade to improve pain-free mobility.      Baseline  B quad/ hamtring strength grossly 5/5 MMT and B hip flexion/ abd. 4/5 MMT on R and 3+/5 MMT on L.; B quad/ hamtring strength grossly 5/5 MMT and B hip flexion/ abd. 4/5 MMT on R and 5/5 MMT on L    Time  4    Period  Weeks    Status  Achieved    Target Date  05/24/18      PT LONG TERM GOAL #2   Title  Pt. will increase Berg balance test to >45 out of 56 to improve independence with gait/ decrease fall risk.      Baseline  Berg: 39/56; 8/28 Berg: 43/56    Time  4    Period  Weeks    Status  Partially Met    Target  Date  05/24/18      PT LONG TERM GOAL #3   Title  Pt. able to ambulate 100 feet with consistent 2 point gait pattern and use of SPC to improve household mobility.     Baseline  Pt. currently using RW; 8/28 consistent with 2 point gait patter with use of lofstrand crutch.    Time  4    Period  Weeks    Status  Achieved    Target Date  05/24/18      PT LONG TERM GOAL #4   Title  Pt. able to stand from normal chair with no UE assist to improve safety/independence with transfers.     Baseline  extra time and UE assist required to stand; 8/28 needs multiple attempts to rise form chair with min use of UE on legs.    Time  4    Period  Weeks    Status  Partially Met    Target Date  05/24/18            Plan - 05/30/18 1426    Clinical Impression Statement  Pt. had difficulty with exercises due to pain in R knee as well as difficulty with keeping eyes open.  Pt. stayed in // bars for majority of session with a 3 LOB noted, and no falls.  Pt. has been making significant progress with balance training and will continue with strengthening and balance going forward in therapy sessions.    Clinical Presentation  Evolving    Clinical Decision Making  Moderate    Rehab Potential  Fair    PT Frequency  2x / week    PT Duration  4 weeks    PT Treatment/Interventions  ADLs/Self Care Home Management;Therapeutic activities;Functional mobility training;Stair training;Gait training;Therapeutic exercise;Balance training;Neuromuscular re-education;Patient/family education;Manual techniques;Passive range of motion    PT Next Visit Plan  Progress LE strengthening/ balance    PT Home Exercise Plan  See handouts       Patient will benefit from skilled therapeutic intervention in order to improve the following deficits and impairments:  Abnormal gait, Decreased balance, Decreased endurance, Decreased mobility, Difficulty walking, Decreased range of  motion, Decreased activity tolerance, Decreased strength,  Impaired flexibility, Postural dysfunction, Pain  Visit Diagnosis: Muscle weakness (generalized)  Gait difficulty  Abnormal posture     Problem List Patient Active Problem List   Diagnosis Date Noted  . 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 HLD (hyperlipidemia) 03/24/2018  . Contusion of right knee 02/14/2018  . Bradycardia 12/28/2017  . 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 Chronic pain of right knee 08/10/2016  . Right ankle pain 08/10/2016  . Venous insufficiency of both lower extremities 05/13/2016  . 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[place]) 12/11/2015  . 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 Lymphoma, 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[place]) 04/03/2015  . Breathlessness on exertion 11/21/2014  . Breath shortness 11/21/2014  . Arthropathy 11/07/2014  . Atrial flutter, paroxysmal ([place]) 11/07/2014  . Type 2 diabetes mellitus ([place]) 11/07/2014  . Benign essential tremor 11/07/2014  . Benign essential HTN 11/07/2014  . Mixed incontinence 11/07/2014  . Hypercholesterolemia without hypertriglyceridemia 11/07/2014  . Apnea, sleep 11/07/2014  . Controlled type 2 diabetes mellitus without complication ([place]) 63/89/3734  . Pure hypercholesterolemia 11/07/2014  . Other abnormalities of gait and mobility 11/02/2011  . Abnormal gait 06/29/2011  . Discoordination 06/29/2011   Pura Spice, PT, DPT # [id number] Gwenlyn Saran, SPT 05/30/2018, 6:22 PM  [facility] [address] [place], [place], [address] Phone: [phone]   Fax:  [phone]  Name: Mike Holloway MRN: [id number] Date of Birth: 09/23/1941

## 2018-05-30 NOTE — Addendum Note (Signed)
Addended by: Dorcas Carrow C on: 05/30/2018 12:50 PM   Modules accepted: Orders

## 2018-06-01 ENCOUNTER — Ambulatory Visit: Payer: Medicare Other | Admitting: Physical Therapy

## 2018-06-01 DIAGNOSIS — M6281 Muscle weakness (generalized): Secondary | ICD-10-CM | POA: Diagnosis not present

## 2018-06-01 DIAGNOSIS — R293 Abnormal posture: Secondary | ICD-10-CM

## 2018-06-01 DIAGNOSIS — R269 Unspecified abnormalities of gait and mobility: Secondary | ICD-10-CM

## 2018-06-01 NOTE — Therapy (Signed)
[facility] [address] [place], [place], [address] Phone: [phone]   Fax:  [phone]  Physical Therapy Treatment  Patient Details  Name: Mike Holloway MRN: [id number] Date of Birth: [birth year] Referring Provider: Dr. Manson Passey   Encounter Date: 06/01/2018  PT End of Session - 06/01/18 1729    Visit Number  11    Number of Visits  17    Date for PT Re-Evaluation  06/21/18    PT Start Time  3570    PT Stop Time  1359    PT Time Calculation (min)  60 min    Equipment Utilized During Treatment  Gait belt    Activity Tolerance  Patient tolerated treatment well    Behavior During Therapy  [facility] for tasks assessed/performed       Past Medical History:  Diagnosis Date  . Arthritis   . Atrial flutter ([place])   . Diabetes mellitus ([place])   . Essential tremor   . Hypercholesteremia   . Hypertension   . Incontinence   . 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 Sleep apnea   . Stroke [facility])     Past Surgical History:  Procedure Laterality Date  . ABLATION    . APPENDECTOMY    . CATARACT EXTRACTION, BILATERAL    . COLONOSCOPY WITH PROPOFOL N/A 03/17/2018   Procedure: COLONOSCOPY WITH PROPOFOL;  Surgeon: Jonathon Bellows, MD;  Location: [facility] ENDOSCOPY;  Service: Gastroenterology;  Laterality: N/A;  . DEEP BRAIN STIMULATOR PLACEMENT    . 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 HEMORRHOID SURGERY    . HERNIA REPAIR    . NASAL SINUS SURGERY    . ORCHIECTOMY    . TONSILLECTOMY      There were no vitals filed for this visit.  Subjective Assessment - 06/01/18 1259    Subjective  Pt. continues to walk wth lofstrand crutch around home and into clinic.  Pt. reports he has not been using the walker at all in past week.  Pt. reports eye appointment went well on Tuesday and he was given new eye  drops and steroids that are assisting with keeping eyes open and decreasing pain.  Pt. reports his eyes feel much better, however R knee is still bothering him.  Pt. reports that knee doctor advised him to use an OTC topical cream with menthol in it for pain management of R knee.  Pt. and wife are going to look for cream today.    Limitations  Lifting;Standing;Walking;House hold activities    Patient Stated Goals  Improve LE strength/ gait and balance with daily tasks.  Return to using cane.      Currently in Pain?  Yes    Pain Score  4     Pain Location  Knee    Pain Orientation  Right    Pain Descriptors / Indicators  Aching    Pain Onset  1 to 4 weeks ago    Multiple Pain Sites  Yes           Treatment:  Neuro:  Dynamic Balance with UE use for Cones at varying heights and thoracic rotation 30 sec x multiple bouts Standing B Cone taps 2x15 each LE Standing Lunge onto BOSU ball for proprioception and balance training 2x20 each LE   There Ex: Standing B Hip  Marches 2x15 each Standing B Hip Extension 2x15 each Standing B Hip Abduction 2x15 each Lateral Side Steps in // bars over 3" step with min UE use x10 each 3" Step-Ups in // bars with min UE use x15 each  Allstandingexercises performed with5# cuff weight        PT Long Term Goals - 05/24/18 1437      PT LONG TERM GOAL #1   Title  Pt. independent with HEP to increase B hip flexion/ abduction strength 1/2 muscle grade to improve pain-free mobility.      Baseline  B quad/ hamtring strength grossly 5/5 MMT and B hip flexion/ abd. 4/5 MMT on R and 3+/5 MMT on L.; B quad/ hamtring strength grossly 5/5 MMT and B hip flexion/ abd. 4/5 MMT on R and 5/5 MMT on L    Time  4    Period  Weeks    Status  Achieved    Target Date  05/24/18      PT LONG TERM GOAL #2   Title  Pt. will increase Berg balance test to >45 out of 56 to improve independence with gait/ decrease fall risk.      Baseline  Berg: 39/56; 8/28 Berg:  43/56    Time  4    Period  Weeks    Status  Partially Met    Target Date  05/24/18      PT LONG TERM GOAL #3   Title  Pt. able to ambulate 100 feet with consistent 2 point gait pattern and use of SPC to improve household mobility.     Baseline  Pt. currently using RW; 8/28 consistent with 2 point gait patter with use of lofstrand crutch.    Time  4    Period  Weeks    Status  Achieved    Target Date  05/24/18      PT LONG TERM GOAL #4   Title  Pt. able to stand from normal chair with no UE assist to improve safety/independence with transfers.     Baseline  extra time and UE assist required to stand; 8/28 needs multiple attempts to rise form chair with min use of UE on legs.    Time  4    Period  Weeks    Status  Partially Met    Target Date  05/24/18            Plan - 06/01/18 1730    Clinical Impression Statement  Pt. reported difficulty with R knee pain, however was able to perform exercises without much increase in discomfort.  Pt. was able to perform all exercises with eyes open and had greater stability, specifically with newly introduced dynamic balance tasks.  Pt. continues to have difficulty with SLS on R LE.  Pt. will continue to benefit from strengthening and balance activities on R LE in order to increase functional capabailities with ambulating in the community.    Clinical Presentation  Evolving    Clinical Decision Making  Moderate    Rehab Potential  Fair    PT Frequency  2x / week    PT Duration  4 weeks    PT Treatment/Interventions  ADLs/Self Care Home Management;Therapeutic activities;Functional mobility training;Stair training;Gait training;Therapeutic exercise;Balance training;Neuromuscular re-education;Patient/family education;Manual techniques;Passive range of motion    PT Next Visit Plan  Progress LE strengthening/ balance    PT Home Exercise Plan  See handouts       Patient will benefit from skilled therapeutic intervention  in order to improve the  following deficits and impairments:  Abnormal gait, Decreased balance, Decreased endurance, Decreased mobility, Difficulty walking, Decreased range of motion, Decreased activity tolerance, Decreased strength, Impaired flexibility, Postural dysfunction, Pain  Visit Diagnosis: Muscle weakness (generalized)  Gait difficulty  Abnormal posture     Problem List Patient Active Problem List   Diagnosis Date Noted  . 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 HLD (hyperlipidemia) 03/24/2018  . Contusion of right knee 02/14/2018  . Bradycardia 12/28/2017  . 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 Chronic pain of right knee 08/10/2016  . Right ankle pain 08/10/2016  . Venous insufficiency of both lower extremities 05/13/2016  . 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[place]) 12/11/2015  . 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 Lymphoma, 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[place]) 04/03/2015  . Breathlessness on exertion 11/21/2014  . Breath shortness 11/21/2014  . Arthropathy 11/07/2014  . Atrial flutter, paroxysmal ([place]) 11/07/2014  . Type 2 diabetes mellitus ([place]) 11/07/2014  . Benign essential tremor 11/07/2014  . Benign essential HTN 11/07/2014  . Mixed incontinence 11/07/2014  . Hypercholesterolemia without hypertriglyceridemia 11/07/2014  . Apnea, sleep 11/07/2014  . Controlled type 2 diabetes mellitus without complication ([place]) 51/88/4166  . Pure hypercholesterolemia 11/07/2014  . Other abnormalities of gait and mobility 11/02/2011  . Abnormal gait 06/29/2011  . Discoordination 06/29/2011   Pura Spice, PT, DPT # [id number] Gwenlyn Saran, SPT 06/01/2018, 5:44 PM  [facility] [address] [place], [place], [address] Phone: [phone]   Fax:  [phone]  Name: Elray Dains MRN: [id number] Date of Birth: Mar 11, 1941

## 2018-06-05 ENCOUNTER — Ambulatory Visit: Payer: Medicare Other | Admitting: Physical Therapy

## 2018-06-05 DIAGNOSIS — M6281 Muscle weakness (generalized): Secondary | ICD-10-CM

## 2018-06-05 DIAGNOSIS — R269 Unspecified abnormalities of gait and mobility: Secondary | ICD-10-CM

## 2018-06-05 DIAGNOSIS — R293 Abnormal posture: Secondary | ICD-10-CM

## 2018-06-06 ENCOUNTER — Encounter: Payer: Self-pay | Admitting: Physical Therapy

## 2018-06-06 NOTE — Therapy (Signed)
[facility] [address] [place], [place], [address] Phone: [phone]   Fax:  [phone]  Physical Therapy Treatment  Patient Details  Name: Mike Holloway MRN: [id number] Date of Birth: [birth year] Referring Provider: Dr. Manson Passey   Encounter Date: 06/05/2018  PT End of Session - 06/06/18 1729    Visit Number  12    Number of Visits  17    Date for PT Re-Evaluation  06/21/18    PT Start Time  9450    PT Stop Time  1347    PT Time Calculation (min)  52 min    Equipment Utilized During Treatment  Gait belt    Activity Tolerance  Patient tolerated treatment well    Behavior During Therapy  [facility] for tasks assessed/performed       Past Medical History:  Diagnosis Date  . Arthritis   . Atrial flutter ([place])   . Diabetes mellitus ([place])   . Essential tremor   . Hypercholesteremia   . Hypertension   . Incontinence   . 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 Sleep apnea   . Stroke [facility])     Past Surgical History:  Procedure Laterality Date  . ABLATION    . APPENDECTOMY    . CATARACT EXTRACTION, BILATERAL    . COLONOSCOPY WITH PROPOFOL N/A 03/17/2018   Procedure: COLONOSCOPY WITH PROPOFOL;  Surgeon: Jonathon Bellows, MD;  Location: [facility] ENDOSCOPY;  Service: Gastroenterology;  Laterality: N/A;  . DEEP BRAIN STIMULATOR PLACEMENT    . 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 HEMORRHOID SURGERY    . HERNIA REPAIR    . NASAL SINUS SURGERY    . ORCHIECTOMY    . TONSILLECTOMY      There were no vitals filed for this visit.  Subjective Assessment - 06/06/18 1421    Subjective  Pt. reports his eyes are much better today, however has been having issues on/off depending on the day.  Pt. states that R knee has been limiting factor for decreased activity levels, however heat has been  alleviating factor.  Pt. would like to try [place] today at conclusion of treatment.    Limitations  Lifting;Standing;Walking;House hold activities    Patient Stated Goals  Improve LE strength/ gait and balance with daily tasks.  Return to using cane.      Currently in Pain?  Yes    Pain Score  4     Pain Location  Knee    Pain Orientation  Right    Pain Descriptors / Indicators  Aching    Pain Type  Acute pain    Pain Onset  1 to 4 weeks ago    Pain Frequency  Intermittent         Treatment:  Neuro:  Dynamic Balance with UE use for Cones at varying heights and thoracic rotation 30 sec x multiple bouts Standing Lunge onto BOSU ball for proprioception and balance training 2x20 each LE Ambulating on Airex Balance Beam (forward, backward, lateral) x5 each way    There Ex: Standing B Hip Marches 2x15 each Standing B Hip Extension 2x15 each Standing B Hip Abduction 2x15 each Standing B Hamstring Curls 2x15 each  Allstandingexercises performed with5# cuff weight   MH applied to R knee at conclusion of therapy for 15 minutes (unbilled)  PT Long Term Goals - 05/24/18 1437      PT LONG TERM GOAL #1   Title  Pt. independent with HEP to increase B hip flexion/ abduction strength 1/2 muscle grade to improve pain-free mobility.      Baseline  B quad/ hamtring strength grossly 5/5 MMT and B hip flexion/ abd. 4/5 MMT on R and 3+/5 MMT on L.; B quad/ hamtring strength grossly 5/5 MMT and B hip flexion/ abd. 4/5 MMT on R and 5/5 MMT on L    Time  4    Period  Weeks    Status  Achieved    Target Date  05/24/18      PT LONG TERM GOAL #2   Title  Pt. will increase Berg balance test to >45 out of 56 to improve independence with gait/ decrease fall risk.      Baseline  Berg: 39/56; 8/28 Berg: 43/56    Time  4    Period  Weeks    Status  Partially Met    Target Date  05/24/18      PT LONG TERM GOAL #3   Title  Pt. able to ambulate 100 feet with consistent 2 point gait  pattern and use of SPC to improve household mobility.     Baseline  Pt. currently using RW; 8/28 consistent with 2 point gait patter with use of lofstrand crutch.    Time  4    Period  Weeks    Status  Achieved    Target Date  05/24/18      PT LONG TERM GOAL #4   Title  Pt. able to stand from normal chair with no UE assist to improve safety/independence with transfers.     Baseline  extra time and UE assist required to stand; 8/28 needs multiple attempts to rise form chair with min use of UE on legs.    Time  4    Period  Weeks    Status  Partially Met    Target Date  05/24/18            Plan - 06/06/18 1729    Clinical Impression Statement  Pt. continues to have difficulty with cetain exercises before rest break is needed due to R knee pain.  Pt. continues to perform well, other than noted pain, and is making improvements with self-correction during LOB.  Pt. is stedily making improvements with walking without an AD and is much more confident when walking with lofstrand crutch.  Pt. will continue to benefit from strengthening and balance-related exercises going forward.    Clinical Presentation  Evolving    Clinical Decision Making  Moderate    Rehab Potential  Fair    PT Frequency  2x / week    PT Duration  4 weeks    PT Treatment/Interventions  ADLs/Self Care Home Management;Therapeutic activities;Functional mobility training;Stair training;Gait training;Therapeutic exercise;Balance training;Neuromuscular re-education;Patient/family education;Manual techniques;Passive range of motion    PT Next Visit Plan  Continue with Strengthening/balance.    PT Home Exercise Plan  See handouts       Patient will benefit from skilled therapeutic intervention in order to improve the following deficits and impairments:  Abnormal gait, Decreased balance, Decreased endurance, Decreased mobility, Difficulty walking, Decreased range of motion, Decreased activity tolerance, Decreased strength,  Impaired flexibility, Postural dysfunction, Pain  Visit Diagnosis: Muscle weakness (generalized)  Gait difficulty  Abnormal posture     Problem List Patient Active Problem List   Diagnosis Date  Noted  . 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 HLD (hyperlipidemia) 03/24/2018  . Contusion of right knee 02/14/2018  . Bradycardia 12/28/2017  . 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 Chronic pain of right knee 08/10/2016  . Right ankle pain 08/10/2016  . Venous insufficiency of both lower extremities 05/13/2016  . 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[place]) 12/11/2015  . 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 Lymphoma, 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[place]) 04/03/2015  . Breathlessness on exertion 11/21/2014  . Breath shortness 11/21/2014  . Arthropathy 11/07/2014  . Atrial flutter, paroxysmal ([place]) 11/07/2014  . Type 2 diabetes mellitus ([place]) 11/07/2014  . Benign essential tremor 11/07/2014  . Benign essential HTN 11/07/2014  . Mixed incontinence 11/07/2014  . Hypercholesterolemia without hypertriglyceridemia 11/07/2014  . Apnea, sleep 11/07/2014  . Controlled type 2 diabetes mellitus without complication ([place]) 85/50/1586  . Pure hypercholesterolemia 11/07/2014  . Other abnormalities of gait and mobility 11/02/2011  . Abnormal gait 06/29/2011  . Discoordination 06/29/2011   Pura Spice, PT, DPT # [id number] Gwenlyn Saran, SPT 06/06/2018, 5:38 PM   [facility] [address] [place], [place], [address] Phone: [phone]   Fax:  [phone]  Name: Jorey Dollard MRN: [id number] Date of Birth: 03-Apr-1941

## 2018-06-07 ENCOUNTER — Ambulatory Visit: Payer: Medicare Other | Admitting: Physical Therapy

## 2018-06-07 DIAGNOSIS — M6281 Muscle weakness (generalized): Secondary | ICD-10-CM | POA: Diagnosis not present

## 2018-06-07 DIAGNOSIS — R269 Unspecified abnormalities of gait and mobility: Secondary | ICD-10-CM

## 2018-06-07 DIAGNOSIS — R293 Abnormal posture: Secondary | ICD-10-CM

## 2018-06-07 NOTE — Therapy (Signed)
[place] [facility] [address] [place], [place], [address] Phone: [phone]   Fax:  [phone]  Physical Therapy Treatment  Patient Details  Name: Mike Holloway MRN: [id number] Date of Birth: 07-Nov-1940 Referring Provider: Dr. Manson Passey   Encounter Date: 06/07/2018  PT End of Session - 06/07/18 1310    Visit Number  13    Number of Visits  17    Date for PT Re-Evaluation  06/21/18    PT Start Time  3845    PT Stop Time  1356    PT Time Calculation (min)  57 min    Equipment Utilized During Treatment  Gait belt    Activity Tolerance  Patient tolerated treatment well    Behavior During Therapy  [facility] for tasks assessed/performed       Past Medical History:  Diagnosis Date  . Arthritis   . Atrial flutter ([place])   . Diabetes mellitus ([place])   . Essential tremor   . Hypercholesteremia   . Hypertension   . Incontinence   . 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 Sleep apnea   . Stroke [facility])     Past Surgical History:  Procedure Laterality Date  . ABLATION    . APPENDECTOMY    . CATARACT EXTRACTION, BILATERAL    . COLONOSCOPY WITH PROPOFOL N/A 03/17/2018   Procedure: COLONOSCOPY WITH PROPOFOL;  Surgeon: Jonathon Bellows, MD;  Location: [facility] ENDOSCOPY;  Service: Gastroenterology;  Laterality: N/A;  . DEEP BRAIN STIMULATOR PLACEMENT    . 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 ENDOSCOPY;  Service: Gastroenterology;  Laterality: N/A;  . HEMORRHOID SURGERY    . HERNIA REPAIR    . NASAL SINUS SURGERY    . ORCHIECTOMY    . TONSILLECTOMY      There were no vitals filed for this visit.  Subjective Assessment - 06/07/18 1308    Subjective  Pt. reports his eye have been open half of the time this morning.  Pt. and wife states that they have been in touch with eye MD about eye condition and that the eyes may be caused by Parkinson's sx's.  Pt. will  also be seeing neurologist tomorrow for knee contraindications with DBS.    Limitations  Lifting;Standing;Walking;House hold activities    Patient Stated Goals  Improve LE strength/ gait and balance with daily tasks.  Return to using cane.      Currently in Pain?  Yes    Pain Score  3     Pain Location  Knee    Pain Orientation  Right    Pain Descriptors / Indicators  Aching    Pain Type  Acute pain    Pain Onset  1 to 4 weeks ago    Pain Frequency  Intermittent           Treatment:  Neuro:  Large, Exaggerated movements of UE, LE in // bars with loud counting with mirror feedback. Hallway Obstacle Course (step ups, step over, cone toe taps, Airex pad step ups, squatting to pick up/put down tennis ball, ball throw) x3 Pt. Had no episodes of LOB and only knocked over 1 cone throughout   Pt. Performed x1 obstacle course bout with 5# weights donned   There Ex: Standing B Hip Marches x15 each Standing B Hip Extension x15 each Standing B Hip Abduction x15 each Standing B Hamstring Curls  x15 each  Allstandingexercises performed with5# cuff weight   MH applied to R knee at conclusion of therapy for 15 minutes (unbilled)        PT Long Term Goals - 05/24/18 1437      PT LONG TERM GOAL #1   Title  Pt. independent with HEP to increase B hip flexion/ abduction strength 1/2 muscle grade to improve pain-free mobility.      Baseline  B quad/ hamtring strength grossly 5/5 MMT and B hip flexion/ abd. 4/5 MMT on R and 3+/5 MMT on L.; B quad/ hamtring strength grossly 5/5 MMT and B hip flexion/ abd. 4/5 MMT on R and 5/5 MMT on L    Time  4    Period  Weeks    Status  Achieved    Target Date  05/24/18      PT LONG TERM GOAL #2   Title  Pt. will increase Berg balance test to >45 out of 56 to improve independence with gait/ decrease fall risk.      Baseline  Berg: 39/56; 8/28 Berg: 43/56    Time  4    Period  Weeks    Status  Partially Met    Target Date   05/24/18      PT LONG TERM GOAL #3   Title  Pt. able to ambulate 100 feet with consistent 2 point gait pattern and use of SPC to improve household mobility.     Baseline  Pt. currently using RW; 8/28 consistent with 2 point gait patter with use of lofstrand crutch.    Time  4    Period  Weeks    Status  Achieved    Target Date  05/24/18      PT LONG TERM GOAL #4   Title  Pt. able to stand from normal chair with no UE assist to improve safety/independence with transfers.     Baseline  extra time and UE assist required to stand; 8/28 needs multiple attempts to rise form chair with min use of UE on legs.    Time  4    Period  Weeks    Status  Partially Met    Target Date  05/24/18            Plan - 06/07/18 1357    Clinical Impression Statement  Pt. continues to make significant progress with loftstrand crutch and confidence in ability to use crutch.  Pt. still has difficulty with ambulating without an AD, however is making progress towards goals.  Pt. at times experiences high-guard reaction from imbalance however is able to retain upright posture.  Pt. performed well with obstacle course and large movements in therapy session today.  Pt. will continue to benefit from large movement therapy along with balance training going forward.    Clinical Presentation  Evolving    Clinical Decision Making  Moderate    Rehab Potential  Fair    PT Frequency  2x / week    PT Duration  4 weeks    PT Treatment/Interventions  ADLs/Self Care Home Management;Therapeutic activities;Functional mobility training;Stair training;Gait training;Therapeutic exercise;Balance training;Neuromuscular re-education;Patient/family education;Manual techniques;Passive range of motion    PT Next Visit Plan  Balance/Large Movement/Obstacle Course with no AD.    PT Home Exercise Plan  See handouts       Patient will benefit from skilled therapeutic intervention in order to improve the following deficits and  impairments:  Abnormal gait, Decreased balance, Decreased endurance, Decreased mobility, Difficulty  walking, Decreased range of motion, Decreased activity tolerance, Decreased strength, Impaired flexibility, Postural dysfunction, Pain  Visit Diagnosis: Muscle weakness (generalized)  Gait difficulty  Abnormal posture     Problem List Patient Active Problem List   Diagnosis Date Noted  . 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 HLD (hyperlipidemia) 03/24/2018  . Contusion of right knee 02/14/2018  . Bradycardia 12/28/2017  . 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 Chronic pain of right knee 08/10/2016  . Right ankle pain 08/10/2016  . Venous insufficiency of both lower extremities 05/13/2016  . 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[place]) 12/11/2015  . 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 Lymphoma, 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[place]) 04/03/2015  . Breathlessness on exertion 11/21/2014  . Breath shortness 11/21/2014  . Arthropathy 11/07/2014  . Atrial flutter, paroxysmal ([place]) 11/07/2014  . Type 2 diabetes mellitus ([place]) 11/07/2014  . Benign essential tremor 11/07/2014  . Benign essential HTN 11/07/2014  . Mixed incontinence 11/07/2014  . Hypercholesterolemia without hypertriglyceridemia 11/07/2014  . Apnea, sleep 11/07/2014  . Controlled type 2 diabetes mellitus without complication ([place]) 18/55/0158  . Pure hypercholesterolemia 11/07/2014  . Other abnormalities of gait and mobility 11/02/2011  . Abnormal gait 06/29/2011  . Discoordination 06/29/2011   Pura Spice, PT, DPT # [id number] Gwenlyn Saran, SPT 06/07/2018, 6:33 PM  [place] [facility] [address] [place], [place], [address] Phone: [phone]   Fax:  [phone]  Name: Mike Holloway MRN: [id number] Date of Birth: 07/18/1941

## 2018-06-08 ENCOUNTER — Encounter: Payer: Self-pay | Admitting: Physical Therapy

## 2018-06-08 DIAGNOSIS — G245 Blepharospasm: Secondary | ICD-10-CM | POA: Insufficient documentation

## 2018-06-12 ENCOUNTER — Ambulatory Visit: Payer: Medicare Other | Admitting: Physical Therapy

## 2018-06-14 ENCOUNTER — Ambulatory Visit: Payer: Medicare Other | Admitting: Physical Therapy

## 2018-06-14 ENCOUNTER — Encounter: Payer: Self-pay | Admitting: Physical Therapy

## 2018-06-14 DIAGNOSIS — M6281 Muscle weakness (generalized): Secondary | ICD-10-CM | POA: Diagnosis not present

## 2018-06-14 DIAGNOSIS — R269 Unspecified abnormalities of gait and mobility: Secondary | ICD-10-CM

## 2018-06-14 DIAGNOSIS — R293 Abnormal posture: Secondary | ICD-10-CM

## 2018-06-15 NOTE — Therapy (Signed)
[facility] [facility] [address] [place], [place], [address] Phone: [phone]   Fax:  [phone]  Physical Therapy Treatment  Patient Details  Name: Mike Holloway MRN: [id number] Date of Birth: [birth year] Referring Provider: Dr. Manson Passey   Encounter Date: 06/14/2018  PT End of Session - 06/14/18 1321    Visit Number  14    Number of Visits  17    Date for PT Re-Evaluation  06/21/18    PT Start Time  1301    PT Stop Time  1358    PT Time Calculation (min)  57 min    Equipment Utilized During Treatment  Gait belt    Activity Tolerance  Patient tolerated treatment well    Behavior During Therapy  [facility] for tasks assessed/performed       Past Medical History:  Diagnosis Date  . Arthritis   . Atrial flutter ([place])   . Diabetes mellitus ([place])   . Essential tremor   . Hypercholesteremia   . Hypertension   . Incontinence   . 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[place])    grew on the testical  . Sleep apnea   . Stroke [facility])     Past Surgical History:  Procedure Laterality Date  . ABLATION    . APPENDECTOMY    . CATARACT EXTRACTION, BILATERAL    . COLONOSCOPY WITH PROPOFOL N/A 03/17/2018   Procedure: COLONOSCOPY WITH PROPOFOL;  Surgeon: Jonathon Bellows, MD;  Location: [facility] ENDOSCOPY;  Service: Gastroenterology;  Laterality: N/A;  . DEEP BRAIN STIMULATOR PLACEMENT    . 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 HEMORRHOID SURGERY    . HERNIA REPAIR    . NASAL SINUS SURGERY    . ORCHIECTOMY    . TONSILLECTOMY      There were no vitals filed for this visit.  Subjective Assessment - 06/14/18 1304    Subjective  Pt. states that he is doing much better than earlier in the week due to the ability to see/ keep eyes open today.  Pt. reports no new complaints today upon entering into the clinic.    Patient is accompained by:   Family member    Limitations  Lifting;Standing;Walking;House hold activities    Patient Stated Goals  Improve LE strength/ gait and balance with daily tasks.  Return to using cane.      Currently in Pain?  Yes    Pain Score  2     Pain Location  Knee    Pain Orientation  Right    Pain Descriptors / Indicators  Aching    Pain Type  Acute pain    Pain Onset  1 to 4 weeks ago         Treatment:  Neuro:  Dynamic Balance with UE use for Cones at varying heights and thoracic rotation x multiple bouts Standing Lunge onto BOSU ball for proprioception and balance training 2x20 each LE Ambulation outside across varying surfaces (grass, mulch, pavement, curbs, etc.) Weighted walking (7.5#) around gym with no AD Ambulation over 3" step in // bars with minA from UE and CGA x8  Lateral ambulation over 3" step in // bars with minA from UE and CGA x8 Standing external perturbations (forward/lateral/backward) 30 sec x multiple bouts  Pt. Does well with lateral perturbations, but struggles with forward/backward movements   There Ex: Standing B Hip Marches x20  each Standing B Hip Extension x20each Standing B Hip Abduction x20each Standing B Hamstring Curls x20 each  Allstandingexercises performed with7.5# cuff weight   MH applied to R knee at conclusion of therapy for 15 minutes (unbilled)      PT Long Term Goals - 05/24/18 1437      PT LONG TERM GOAL #1   Title  Pt. independent with HEP to increase B hip flexion/ abduction strength 1/2 muscle grade to improve pain-free mobility.      Baseline  B quad/ hamtring strength grossly 5/5 MMT and B hip flexion/ abd. 4/5 MMT on R and 3+/5 MMT on L.; B quad/ hamtring strength grossly 5/5 MMT and B hip flexion/ abd. 4/5 MMT on R and 5/5 MMT on L    Time  4    Period  Weeks    Status  Achieved    Target Date  05/24/18      PT LONG TERM GOAL #2   Title  Pt. will increase Berg balance test to >45 out of 56 to improve independence with  gait/ decrease fall risk.      Baseline  Berg: 39/56; 8/28 Berg: 43/56    Time  4    Period  Weeks    Status  Partially Met    Target Date  05/24/18      PT LONG TERM GOAL #3   Title  Pt. able to ambulate 100 feet with consistent 2 point gait pattern and use of SPC to improve household mobility.     Baseline  Pt. currently using RW; 8/28 consistent with 2 point gait patter with use of lofstrand crutch.    Time  4    Period  Weeks    Status  Achieved    Target Date  05/24/18      PT LONG TERM GOAL #4   Title  Pt. able to stand from normal chair with no UE assist to improve safety/independence with transfers.     Baseline  extra time and UE assist required to stand; 8/28 needs multiple attempts to rise form chair with min use of UE on legs.    Time  4    Period  Weeks    Status  Partially Met    Target Date  05/24/18            Plan - 06/14/18 1418    Clinical Impression Statement  Pt. performed really well today with upright activities.  Pt. was able to walk around gym without lofstrand crutch with increased step length and increased stability.  Pt. increased weight for standing exercises as well.   1 sharp episode of knee pain with standing hip extension at //-bars.  No change to HEP.  Pt. encouarged to go out to lunch/dinner with wife and use loftstrand crutch.      Clinical Presentation  Evolving    Clinical Decision Making  Moderate    Rehab Potential  Fair    PT Frequency  2x / week    PT Duration  4 weeks    PT Treatment/Interventions  ADLs/Self Care Home Management;Therapeutic activities;Functional mobility training;Stair training;Gait training;Therapeutic exercise;Balance training;Neuromuscular re-education;Patient/family education;Manual techniques;Passive range of motion    PT Next Visit Plan  Balance/Large Movement/Obstacle Course with no AD.    PT Home Exercise Plan  See handouts       Patient will benefit from skilled therapeutic intervention in order to improve  the following deficits and impairments:  Abnormal gait, Decreased balance,  Decreased endurance, Decreased mobility, Difficulty walking, Decreased range of motion, Decreased activity tolerance, Decreased strength, Impaired flexibility, Postural dysfunction, Pain  Visit Diagnosis: Muscle weakness (generalized)  Gait difficulty  Abnormal posture     Problem List Patient Active Problem List   Diagnosis Date Noted  . 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 HLD (hyperlipidemia) 03/24/2018  . Contusion of right knee 02/14/2018  . Bradycardia 12/28/2017  . 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 Chronic pain of right knee 08/10/2016  . Right ankle pain 08/10/2016  . Venous insufficiency of both lower extremities 05/13/2016  . 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[place]) 12/11/2015  . 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 Lymphoma, 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[place]) 04/03/2015  . Breathlessness on exertion 11/21/2014  . Breath shortness 11/21/2014  . Arthropathy 11/07/2014  . Atrial flutter, paroxysmal ([place]) 11/07/2014  . Type 2 diabetes mellitus ([place]) 11/07/2014  . Benign essential tremor 11/07/2014  . Benign essential HTN 11/07/2014  . Mixed incontinence 11/07/2014  . Hypercholesterolemia without hypertriglyceridemia 11/07/2014  . Apnea, sleep 11/07/2014  . Controlled type 2 diabetes mellitus without complication ([place]) 35/57/3220  . Pure hypercholesterolemia 11/07/2014  . Other abnormalities of gait and mobility 11/02/2011  . Abnormal gait 06/29/2011  . Discoordination 06/29/2011   Pura Spice, PT, DPT # [id number] Gwenlyn Saran, SPT 06/15/2018, 7:56 AM  [place] [facility] [address] [place], [place], [address] Phone: [phone]   Fax:  [phone]  Name: Ashaz Robling MRN: [id number] Date of Birth: 1941-08-26

## 2018-06-19 ENCOUNTER — Ambulatory Visit: Payer: Medicare Other | Admitting: Physical Therapy

## 2018-06-20 ENCOUNTER — Ambulatory Visit
Payer: Medicare Other | Attending: Student in an Organized Health Care Education/Training Program | Admitting: Student in an Organized Health Care Education/Training Program

## 2018-06-20 ENCOUNTER — Encounter: Payer: Self-pay | Admitting: Student in an Organized Health Care Education/Training Program

## 2018-06-20 ENCOUNTER — Other Ambulatory Visit: Payer: Self-pay

## 2018-06-20 VITALS — BP 123/76 | HR 66 | Temp 98.7°F | Resp 18 | Ht 68.0 in | Wt 193.0 lb

## 2018-06-20 DIAGNOSIS — E785 Hyperlipidemia, unspecified: Secondary | ICD-10-CM | POA: Insufficient documentation

## 2018-06-20 DIAGNOSIS — I10 Essential (primary) hypertension: Secondary | ICD-10-CM | POA: Insufficient documentation

## 2018-06-20 DIAGNOSIS — M25561 Pain in right knee: Secondary | ICD-10-CM | POA: Diagnosis present

## 2018-06-20 DIAGNOSIS — E7800 Pure hypercholesterolemia, unspecified: Secondary | ICD-10-CM | POA: Diagnosis not present

## 2018-06-20 DIAGNOSIS — R0602 Shortness of breath: Secondary | ICD-10-CM | POA: Diagnosis not present

## 2018-06-20 DIAGNOSIS — Z96651 Presence of right artificial knee joint: Secondary | ICD-10-CM | POA: Diagnosis not present

## 2018-06-20 DIAGNOSIS — E119 Type 2 diabetes mellitus without complications: Secondary | ICD-10-CM | POA: Diagnosis not present

## 2018-06-20 DIAGNOSIS — G8929 Other chronic pain: Secondary | ICD-10-CM | POA: Insufficient documentation

## 2018-06-20 DIAGNOSIS — Z8673 Personal history of transient ischemic attack (TIA), and cerebral infarction without residual deficits: Secondary | ICD-10-CM | POA: Diagnosis not present

## 2018-06-20 DIAGNOSIS — I872 Venous insufficiency (chronic) (peripheral): Secondary | ICD-10-CM | POA: Insufficient documentation

## 2018-06-20 DIAGNOSIS — M1711 Unilateral primary osteoarthritis, right knee: Secondary | ICD-10-CM | POA: Diagnosis not present

## 2018-06-20 DIAGNOSIS — Z9889 Other specified postprocedural states: Secondary | ICD-10-CM | POA: Insufficient documentation

## 2018-06-20 DIAGNOSIS — R001 Bradycardia, unspecified: Secondary | ICD-10-CM | POA: Diagnosis not present

## 2018-06-20 DIAGNOSIS — Z79899 Other long term (current) drug therapy: Secondary | ICD-10-CM | POA: Insufficient documentation

## 2018-06-20 DIAGNOSIS — M25571 Pain in right ankle and joints of right foot: Secondary | ICD-10-CM | POA: Diagnosis not present

## 2018-06-20 DIAGNOSIS — Z8572 Personal history of non-Hodgkin lymphomas: Secondary | ICD-10-CM | POA: Insufficient documentation

## 2018-06-20 NOTE — Patient Instructions (Addendum)
You  And your wife have been given pre procedure instructions    Preparing for your procedure (without sedation) Instructions: Oral Intake: Do not eat or drink anything for at least 3 hours prior to your procedure. Transportation: Unless otherwise stated by your physician, you may drive yourself after the procedure. Blood Pressure Medicine: Take your blood pressure medicine with a sip of water the morning of the procedure. Insulin: Take only  of your normal insulin dose. Preventing infections: Shower with an antibacterial soap the morning of your procedure. Build-up your immune system: Take 1000 mg of Vitamin C with every meal (3 times a day) the day prior to your procedure. Pregnancy: If you are pregnant, call and cancel the procedure. Sickness: If you have a cold, fever, or any active infections, call and cancel the procedure. Arrival: You must be in the facility at least 30 minutes prior to your scheduled procedure. Children: Do not bring any children with you. Dress appropriately: Bring dark clothing that you would not mind if they get stained. Valuables: Do not bring any jewelry or valuables. Procedure appointments are reserved for interventional treatments only. No Prescription Refills. No medication changes will be discussed during procedure appointments. No disability issues will be discussed.

## 2018-06-20 NOTE — Progress Notes (Signed)
Patient's Name: Mike Holloway  MRN: [id number]  Referring Provider: Leanor Kail, MD  DOB: September 14, 1941  PCP: Mike Hartigan, MD  DOS: 06/20/2018  Note by: Mike Santa, MD  Service setting: Ambulatory outpatient  Specialty: Interventional Pain Management  Location: [facility] (AMB) Pain Management Facility  Visit type: Initial Patient Evaluation  Patient type: New Patient   Primary Reason(s) for Visit: Encounter for initial evaluation of one or more chronic problems (new to examiner) potentially causing chronic pain, and posing a threat to normal musculoskeletal function. (Level of risk: High) CC: Knee Pain (right)  HPI  Mike Holloway is a 77 y.o. year old, male patient, who comes today to see [place] for the first time for an initial evaluation of his chronic pain. He has Arthropathy; Atrial flutter, paroxysmal ([name]); Type 2 diabetes mellitus ([name]); Benign essential tremor; Benign essential HTN; Mixed incontinence; Lymphoma, 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[name]); Hypercholesterolemia without hypertriglyceridemia; Breathlessness on exertion; Apnea, sleep; Abnormal gait; Controlled type 2 diabetes mellitus without complication ([name]); Discoordination; 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[name]); Other abnormalities of gait and mobility; Breath shortness; Pure hypercholesterolemia; Bladder retention; Chronic pain of right knee; Right ankle pain; Venous insufficiency of both lower extremities; Bradycardia; Contusion of right knee; Status post right unicompartmental knee replacement; Recurrent Clostridium difficile diarrhea; and HLD (hyperlipidemia) on their problem list. Today he comes in for evaluation of his Knee Pain (right)  Pain Assessment: Location: Right Knee Radiating: denies Onset: More than a month ago Duration: Chronic pain Quality: Aching Severity: 3 /10 (subjective, self-reported pain score)  Note: Reported level is compatible with observation.                         When using our objective Pain Scale, levels between 6 and  10/10 are said to belong in an emergency room, as it progressively worsens from a 6/10, described as severely limiting, requiring emergency care not usually available at an outpatient pain management facility. At a 6/10 level, communication becomes difficult and requires great effort. Assistance to reach the emergency department may be required. Facial flushing and profuse sweating along with potentially dangerous increases in heart rate and blood pressure will be evident. Effect on ADL: limits activities Timing: Constant Modifying factors: ice BP: 123/76  HR: 66  Onset and Duration: Gradual Cause of pain: Unknown Severity: No change since onset, NAS-11 at its worse: 6/10, NAS-11 at its best: 2/10, NAS-11 now: 2/10 and NAS-11 on the average: 2/10 Timing: Not influenced by the time of the day, During activity or exercise and After activity or exercise Aggravating Factors: Climbing, Prolonged standing, Stooping , Walking, Walking uphill and Walking downhill Alleviating Factors: Cold packs, Hot packs, Medications, Resting, Sitting and Sleeping Associated Problems: Constipation, Erectile dysfunction and Inability to control bladder (urine) Quality of Pain: Aching, Annoying, Constant, Intermittent, Disabling, Distressing, Dull, Exhausting, Hot, Nagging, Sharp, Stabbing, Tender, Throbbing, Tiring and Uncomfortable Previous Examinations or Tests: CT scan, Endoscopy, X-rays, Orthopedic evaluation and Chiropractic evaluation Previous Treatments: Chiropractic manipulations, Epidural steroid injections and Physical Therapy  The patient comes into the clinics today for the first time for a chronic pain management evaluation.   Patient is a 77 year old male status post right knee arthroplasty September 2018 was having pain in his right knee medial greater than lateral aspect.  Patient also endorses knee instability.  Patient has been participating in physical therapy approximately twice a week.  Patient also  endorses right ankle pain.  Of note patient has a deep brain stimulator in place for  essential tremors.  Patient has chronic speech difficulty.  His wife who is also his caregiver provided collateral information.  Patient ambulates with a cane.  He does utilize Voltaren gel.  He is referred here for a right knee genicular nerve block and possible cooled radiofrequency ablation.  We will focus primarily on interventional pain management.  Medication management to continue with primary care physician.  Historic Controlled Substance Pharmacotherapy Review  Historical Background Evaluation: [place] PMP: Six (6) year initial data search conducted.              Meds   Current Outpatient Medications:  .  Cholecalciferol (VITAMIN D-1000 MAX ST) 1000 UNITS tablet, Take 1,000 Units by mouth at bedtime. , Disp: , Rfl:  .  famotidine (PEPCID) 20 MG tablet, TK 1 T PO BID, Disp: , Rfl: 3 .  FLUoxetine (PROZAC) 10 MG tablet, Take 10 mg by mouth daily., Disp: , Rfl:  .  mirabegron ER (MYRBETRIQ) 50 MG TB24 tablet, Take 1 tablet (50 mg total) by mouth daily., Disp: 30 tablet, Rfl: 5 .  montelukast (SINGULAIR) 10 MG tablet, Take 10 mg by mouth at bedtime., Disp: , Rfl:  .  neomycin-polymyxin b-dexamethasone (MAXITROL) 3.5-10000-0.1 SUSP, SHAKE LQ AND INT 1 GTT IN OU TID FOR 14 DAYS, Disp: , Rfl: 0 .  NON FORMULARY, Real time pain relief, Disp: , Rfl:  .  primidone (MYSOLINE) 50 MG tablet, Take 50-100 mg by mouth 2 (two) times daily. 100 mg in the morning and 50 mg at bedtime, Disp: , Rfl:  .  propranolol ER (INDERAL LA) 80 MG 24 hr capsule, Take 160 mg by mouth daily. , Disp: , Rfl:  .  tamsulosin (FLOMAX) 0.4 MG CAPS capsule, Take 0.4 mg by mouth daily., Disp: , Rfl:  .  Triamcinolone Acetonide (NASACORT AQ NA), Place 2 sprays into the nose daily. , Disp: , Rfl:  .  triamterene-hydrochlorothiazide (MAXZIDE-25) 37.5-25 MG tablet, TK 1 T PO QD, Disp: , Rfl: 1 .  diclofenac sodium (VOLTAREN) 1 % GEL, Apply 2 g  topically 2 (two) times daily as needed (pain). , Disp: , Rfl:  .  fidaxomicin (DIFICID) 200 MG TABS tablet, Take 1 tablet (200 mg total) by mouth 2 (two) times daily. (Patient not taking: Reported on 06/20/2018), Disp: 16 tablet, Rfl: 0 .  Probiotic Product (VSL#3 PO), Take 1 capsule by mouth 2 (two) times daily. , Disp: , Rfl:   Imaging Review     Knee-L MR wo contrast: No results found for this or any previous visit. Knee-R CT w contrast:  Results for orders placed during the hospital encounter of 02/24/17  CT KNEE RIGHT W CONTRAST   Narrative CLINICAL DATA:  Knee pain for a long time. Right knee osteoarthritis.  EXAM: CT OF THE RIGHT KNEE WITH CONTRAST (ARTHROGRAM)  TECHNIQUE: Multidetector CT imaging was performed following the standard protocol during bolus administration of intravenous contrast.  COMPARISON:  None.  FINDINGS: MENISCI  Medial: Intact.  Lateral: Intact.  LIGAMENTS  Cruciates: ACL Intact. PCL Intact.  Collaterals: MCL Intact. Lateral collateral ligament complex Intact.  CARTILAGE  Patellofemoral: Full-thickness cartilage loss of the lateral patellar facet. Partial-thickness cartilage loss of the lateral trochlea.  Medial compartment: No focal chondral defect.  Lateral compartment: Tiny focal area of partial-thickness cartilage loss of the periphery of the lateral femoral condyle.  BONES: Generalized osteopenia. No aggressive lytic or sclerotic osseous lesion. No fracture or dislocation.  JOINT: Intraarticular contrast distending the joint capsule. Normal Hoffa's fat-pad.  No plical thickening.  EXTENSOR MECHANISM: Intact quadriceps tendon and patellar tendon.  POPLITEAL FOSSA: Popliteus intact.  Soft tissue: Holloway atrophy of the media gastrocnemius muscle.  IMPRESSION: 1. Full-thickness cartilage loss of the lateral patellar facet. Partial-thickness cartilage loss of the lateral trochlea. 2. No meniscal or ligamentous injury of the  right knee.   Electronically Signed   By: Kathreen Devoid   On: 02/24/2017 15:52    Complexity Note: Imaging results reviewed. Results shared with Mr. Dolinger, using Layman's terms.                         ROS  Cardiovascular: High blood pressure and Heart catheterization Pulmonary or Respiratory: Snoring  and Temporary stoppage of breathing during sleep Neurological: Abnormal skin sensations (Peripheral Neuropathy) and Incontinence:  Urinary Review of Past Neurological Studies: No results found for this or any previous visit. Psychological-Psychiatric: No reported psychological or psychiatric signs or symptoms such as difficulty sleeping, anxiety, depression, delusions or hallucinations (schizophrenial), mood swings (bipolar disorders) or suicidal ideations or attempts Gastrointestinal: Irregular, infrequent bowel movements (Constipation) Genitourinary: No reported renal or genitourinary signs or symptoms such as difficulty voiding or producing urine, peeing blood, non-functioning kidney, kidney stones, difficulty emptying the bladder, difficulty controlling the flow of urine, or chronic kidney disease  Hematological: No reported hematological signs or symptoms such as prolonged bleeding, low or poor functioning platelets, bruising or bleeding easily, hereditary bleeding problems, low energy levels due to low hemoglobin or being anemic Endocrine: High blood sugar controlled without the use of insulin (NIDDM) Rheumatologic: Joint aches and or swelling due to excess weight (Osteoarthritis) Musculoskeletal: Negative for myasthenia gravis, muscular dystrophy, multiple sclerosis or malignant hyperthermia Work History: Retired  Allergies  Mr. Antosh is allergic to clindamycin; flagyl [metronidazole]; ambien  [zolpidem]; penicillins; and sulfa antibiotics.  Laboratory Chemistry  Inflammation Markers (CRP: Acute Phase) (ESR: Chronic Phase) No results found for: CRP, ESRSEDRATE, LATICACIDVEN                        Rheumatology Markers No results found for: RF, ANA, LABURIC, URICUR, LYMEIGGIGMAB, LYMEABIGMQN, HLAB27                      Renal Function Markers Lab Results  Component Value Date   BUN 15 03/29/2018   CREATININE 1.11 03/29/2018   GFRAA >60 03/29/2018   GFRNONAA >60 03/29/2018                             Hepatic Function Markers Lab Results  Component Value Date   AST 23 03/24/2018   ALT 16 03/24/2018   ALBUMIN 3.3 (L) 03/24/2018   ALKPHOS 72 03/24/2018   HCVAB 0.3 03/10/2018                        Electrolytes Lab Results  Component Value Date   NA 138 03/29/2018   K 4.2 03/29/2018   CL 112 (H) 03/29/2018   CALCIUM 8.8 (L) 03/29/2018   MG 1.8 03/29/2018                        Neuropathy Markers Lab Results  Component Value Date   HIV Non Reactive 03/10/2018  CNS Tests No results found for: COLORCSF, APPEARCSF, RBCCOUNTCSF, WBCCSF, POLYSCSF, LYMPHSCSF, EOSCSF, PROTEINCSF, GLUCCSF, JCVIRUS, CSFOLI, IGGCSF                      Bone Pathology Markers No results found for: VD25OH, DX833AS5KNL, ZJ6734LP3, XT0240XB3, 25OHVITD1, 25OHVITD2, 25OHVITD3, TESTOFREE, TESTOSTERONE                       Coagulation Parameters Lab Results  Component Value Date   PLT 118 (L) 03/25/2018                        Cardiovascular Markers Lab Results  Component Value Date   HGB 11.2 (L) 03/25/2018   HCT 32.1 (L) 03/25/2018                         CA Markers No results found for: CEA, CA125, LABCA2                      Note: Lab results reviewed.  PFSH  Drug: Mr. Umland  reports that he does not use drugs. Alcohol:  reports that he does not drink alcohol. Tobacco:  reports that he has never smoked. He has never used smokeless tobacco. Medical:  has a past medical history of Arthritis, Atrial flutter ([place]), Diabetes mellitus ([place]), Essential tremor, Hypercholesteremia, Hypertension, Incontinence, 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[place]), Sleep apnea,  and Stroke ([place]). Family: family history includes Heart disease in his mother; Hematuria in his father; Prostate cancer in his father.  Past Surgical History:  Procedure Laterality Date  . ABLATION    . APPENDECTOMY    . CATARACT EXTRACTION, BILATERAL    . COLONOSCOPY WITH PROPOFOL N/A 03/17/2018   Procedure: COLONOSCOPY WITH PROPOFOL;  Surgeon: Jonathon Bellows, MD;  Location: [facility] ENDOSCOPY;  Service: Gastroenterology;  Laterality: N/A;  . DEEP BRAIN STIMULATOR PLACEMENT    . 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 HEMORRHOID SURGERY    . HERNIA REPAIR    . NASAL SINUS SURGERY    . ORCHIECTOMY    . TONSILLECTOMY     Active Ambulatory Problems    Diagnosis Date Noted  . Arthropathy 11/07/2014  . Atrial flutter, paroxysmal ([place]) 11/07/2014  . Type 2 diabetes mellitus ([place]) 11/07/2014  . Benign essential tremor 11/07/2014  . Benign essential HTN 11/07/2014  . Mixed incontinence 11/07/2014  . Lymphoma, 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[place]) 04/03/2015  . Hypercholesterolemia without hypertriglyceridemia 11/07/2014  . Breathlessness on exertion 11/21/2014  . Apnea, sleep 11/07/2014  . Abnormal gait 06/29/2011  . Controlled type 2 diabetes mellitus without complication ([place]) 53/29/9242  . Discoordination 06/29/2011  . 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[place]) 12/11/2015  . Other abnormalities of gait and mobility 11/02/2011  . Breath shortness 11/21/2014  . Pure hypercholesterolemia 11/07/2014  . 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 Chronic pain of right knee 08/10/2016  . Right ankle pain 08/10/2016  . Venous insufficiency of both lower extremities 05/13/2016  . Bradycardia 12/28/2017  . Contusion of right knee 02/14/2018  . 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 HLD (hyperlipidemia) 03/24/2018   Resolved  Ambulatory Problems    Diagnosis Date Noted  . No Resolved Ambulatory Problems   Past Medical History:  Diagnosis Date  . Arthritis   . Atrial  flutter ([place])   . Diabetes mellitus ([place])   . Essential tremor   . Hypercholesteremia   . Hypertension   . Incontinence   . 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[place])   . Sleep apnea   . Stroke [facility])    Constitutional Exam  General appearance: Well nourished, well developed, and well hydrated. In no apparent acute distress Vitals:   06/20/18 0848  BP: 123/76  Pulse: 66  Resp: 18  Temp: 98.7 F (37.1 C)  SpO2: 99%  Weight: 193 lb (87.5 kg)  Height: '5\' 8"'  (1.727 m)   BMI Assessment: Estimated body mass index is 29.35 kg/m as calculated from the following:   Height as of this encounter: '5\' 8"'  (1.727 m).   Weight as of this encounter: 193 lb (87.5 kg).  BMI interpretation table: BMI level Category Range association with higher incidence of chronic pain  <18 kg/m2 Underweight   18.5-24.9 kg/m2 Ideal body weight   25-29.9 kg/m2 Overweight Increased incidence by 20%  30-34.9 kg/m2 Obese (Class I) Increased incidence by 68%  35-39.9 kg/m2 Holloway obesity (Class II) Increased incidence by 136%  >40 kg/m2 Extreme obesity (Class III) Increased incidence by 254%   Patient's current BMI Ideal Body weight  Body mass index is 29.35 kg/m. Ideal body weight: 68.4 kg (150 lb 12.7 oz) Adjusted ideal body weight: 76.1 kg (167 lb 10.8 oz)   BMI Readings from Last 4 Encounters:  06/20/18 29.35 kg/m  03/27/18 30.34 kg/m  03/17/18 29.80 kg/m  03/14/18 29.86 kg/m   Wt Readings from Last 4 Encounters:  06/20/18 193 lb (87.5 kg)  03/27/18 199 lb 8.3 oz (90.5 kg)  03/17/18 196 lb (88.9 kg)  03/14/18 196 lb 6.4 oz (89.1 kg)  Psych/Mental status: Alert, oriented x 3 (person, place, & time)       Eyes: PERLA Respiratory: No evidence of acute respiratory distress  Cervical Spine Area Exam  Skin & Axial Inspection: No masses, redness, edema, swelling, or  associated skin lesions Alignment: Symmetrical Functional ROM: Unrestricted ROM      Stability: No instability detected Muscle Tone/Strength: Functionally intact. No obvious neuro-muscular anomalies detected. Sensory (Neurological): Unimpaired Palpation: No palpable anomalies              Upper Extremity (UE) Exam    Side: Right upper extremity  Side: Left upper extremity  Skin & Extremity Inspection: Skin color, temperature, and hair growth are WNL. No peripheral edema or cyanosis. No masses, redness, swelling, asymmetry, or associated skin lesions. No contractures.  Skin & Extremity Inspection: Skin color, temperature, and hair growth are WNL. No peripheral edema or cyanosis. No masses, redness, swelling, asymmetry, or associated skin lesions. No contractures.  Functional ROM: Unrestricted ROM          Functional ROM: Unrestricted ROM          Muscle Tone/Strength: Functionally intact. No obvious neuro-muscular anomalies detected.  Muscle Tone/Strength: Functionally intact. No obvious neuro-muscular anomalies detected.  Sensory (Neurological): Unimpaired          Sensory (Neurological): Unimpaired          Palpation: No palpable anomalies              Palpation: No palpable anomalies              Provocative Test(s):  Phalen's test: deferred Tinel's test: deferred Apley's scratch test (touch opposite shoulder):  Action 1 (Across chest): deferred Action 2 (Overhead): deferred Action 3 (LB reach): deferred   Provocative Test(s):  Phalen's  test: deferred Tinel's test: deferred Apley's scratch test (touch opposite shoulder):  Action 1 (Across chest): deferred Action 2 (Overhead): deferred Action 3 (LB reach): deferred    Thoracic Spine Area Exam  Skin & Axial Inspection: No masses, redness, or swelling Alignment: Symmetrical Functional ROM: Unrestricted ROM Stability: No instability detected Muscle Tone/Strength: Functionally intact. No obvious neuro-muscular anomalies  detected. Sensory (Neurological): Unimpaired Muscle strength & Tone: No palpable anomalies  Lumbar Spine Area Exam  Skin & Axial Inspection: No masses, redness, or swelling Alignment: Symmetrical Functional ROM: Unrestricted ROM       Stability: No instability detected Muscle Tone/Strength: Functionally intact. No obvious neuro-muscular anomalies detected. Sensory (Neurological): Unimpaired Palpation: No palpable anomalies       Provocative Tests: Hyperextension/rotation test: deferred today       Lumbar quadrant test (Kemp's test): deferred today       Lateral bending test: deferred today       Patrick's Maneuver: deferred today                   FABER test: deferred today                   S-I anterior distraction/compression test: deferred today         S-I lateral compression test: deferred today         S-I Thigh-thrust test: deferred today         S-I Gaenslen's test: deferred today          Gait & Posture Assessment  Ambulation: Patient ambulates using a cane Gait: Antalgic Posture: Difficulty standing up straight, due to pain   Lower Extremity Exam    Side: Right lower extremity  Side: Left lower extremity  Stability: No instability observed          Stability: No instability observed          Skin & Extremity Inspection: Evidence of prior arthroplastic surgery  Skin & Extremity Inspection: Skin color, temperature, and hair growth are WNL. No peripheral edema or cyanosis. No masses, redness, swelling, asymmetry, or associated skin lesions. No contractures.  Functional ROM: Decreased ROM for knee joint          Functional ROM: Unrestricted ROM                  Muscle Tone/Strength: Functionally intact. No obvious neuro-muscular anomalies detected.  Muscle Tone/Strength: Functionally intact. No obvious neuro-muscular anomalies detected.  Sensory (Neurological): Arthropathic arthralgia  Sensory (Neurological): Unimpaired  Palpation: Complains of area being tender to  palpation  Palpation: No palpable anomalies   Assessment  Primary Diagnosis & Pertinent Problem List: The primary encounter diagnosis was Hx of total knee arthroplasty, right. Diagnoses of Primary osteoarthritis of right knee, Chronic pain of right knee, and Status post right unicompartmental knee replacement were also pertinent to this visit.  Visit Diagnosis (New problems to examiner): 1. Hx of total knee arthroplasty, right   2. Primary osteoarthritis of right knee   3. Chronic pain of right knee   4. Status post right unicompartmental knee replacement    General Recommendations: The pain condition that the patient suffers from is best treated with a multidisciplinary approach that involves an increase in physical activity to prevent de-conditioning and worsening of the pain cycle, as well as psychological counseling (formal and/or informal) to address the co-morbid psychological affects of pain. Treatment will often involve judicious use of pain medications and interventional procedures to decrease the pain, allowing the  patient to participate in the physical activity that will ultimately produce long-lasting pain reductions. The goal of the multidisciplinary approach is to return the patient to a higher level of overall function and to restore their ability to perform activities of daily living.  Patient is a 77 year old male status post right knee arthroplasty September 2018 was having pain in his right knee medial greater than lateral aspect.  Patient also endorses knee instability.  Patient has been participating in physical therapy approximately twice a week.  Patient also endorses right ankle pain.  Of note patient has a deep brain stimulator in place for essential tremors.  Patient has chronic speech difficulty.  His wife who is also his caregiver provided collateral information.  Patient ambulates with a cane.  He does utilize Voltaren gel.  He is referred here for a right knee genicular  nerve block and possible cooled radiofrequency ablation.  Risks and benefits of this procedure were discussed in great detail with the patient as well as his wife.  All questions and concerns were answered.  Patient would like to proceed.  We will get this patient scheduled for right knee genicular nerve block without sedation under fluoroscopy.   Plan of Care (Initial workup plan)  Ordered Lab-work, Procedure(s), Referral(s), & Consult(s): Orders Placed This Encounter  Procedures  . GENICULAR NERVE BLOCK   Provider-requested follow-up: Return in about 2 weeks (around 07/04/2018) for Procedure.  Future Appointments  Date Time Provider [place]  06/21/2018  1:00 PM Pura Spice, PT [facility] None  07/03/2018  9:00 AM Mike Santa, MD [facility] None  09/14/2018 10:45 AM [facility] None    Primary Care Physician: Mike Hartigan, MD Location: [facility] Out[facility] Facility Note by: Mike Holloway, M.D, Date: 06/20/2018; Time: 9:53 AM  Patient Instructions  You  And your wife have been given pre procedure instructions    Preparing for your procedure (without sedation) Instructions: Oral Intake: Do not eat or drink anything for at least 3 hours prior to your procedure. Transportation: Unless otherwise stated by your physician, you may drive yourself after the procedure. Blood Pressure Medicine: Take your blood pressure medicine with a sip of water the morning of the procedure. Insulin: Take only  of your normal insulin dose. Preventing infections: Shower with an antibacterial soap the morning of your procedure. Build-up your immune system: Take 1000 mg of Vitamin C with every meal (3 times a day) the day prior to your procedure. Pregnancy: If you are pregnant, call and cancel the procedure. Sickness: If you have a cold, fever, or any active infections, call and cancel the procedure. Arrival: You must be in the facility at least 30  minutes prior to your scheduled procedure. Children: Do not bring any children with you. Dress appropriately: Bring dark clothing that you would not mind if they get stained. Valuables: Do not bring any jewelry or valuables. Procedure appointments are reserved for interventional treatments only. No Prescription Refills. No medication changes will be discussed during procedure appointments. No disability issues will be discussed.

## 2018-06-20 NOTE — Progress Notes (Signed)
Safety precautions to be maintained throughout the outpatient stay will include: orient to surroundings, keep bed in low position, maintain call bell within reach at all times, provide assistance with transfer out of bed and ambulation.  

## 2018-06-21 ENCOUNTER — Ambulatory Visit: Payer: Medicare Other | Admitting: Physical Therapy

## 2018-06-21 DIAGNOSIS — R269 Unspecified abnormalities of gait and mobility: Secondary | ICD-10-CM

## 2018-06-21 DIAGNOSIS — M6281 Muscle weakness (generalized): Secondary | ICD-10-CM | POA: Diagnosis not present

## 2018-06-21 DIAGNOSIS — R293 Abnormal posture: Secondary | ICD-10-CM

## 2018-06-21 NOTE — Therapy (Addendum)
[facility] [address]. [place], [place], [address] Phone: [phone]   Fax:  [phone]  Physical Therapy Treatment/ Progress Note Dates of Reporting Period 05/30/2018 to 06/21/2018  Patient Details  Name: Mike Holloway MRN: [id number] Date of Birth: Oct 07, 1940 Referring Provider: Dr. Manson Passey   Encounter Date: 06/21/2018  PT End of Session - 06/21/18 1308    Visit Number  15    Number of Visits  17    Date for PT Re-Evaluation  06/21/18    PT Start Time  1300    PT Stop Time  1356    PT Time Calculation (min)  56 min    Equipment Utilized During Treatment  Gait belt    Activity Tolerance  Patient tolerated treatment well    Behavior During Therapy  [facility] for tasks assessed/performed       Past Medical History:  Diagnosis Date  . Arthritis   . Atrial flutter ([place])   . Diabetes mellitus ([place])   . Essential tremor   . Hypercholesteremia   . Hypertension   . Incontinence   . 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 Sleep apnea   . Stroke [facility])     Past Surgical History:  Procedure Laterality Date  . ABLATION    . APPENDECTOMY    . CATARACT EXTRACTION, BILATERAL    . COLONOSCOPY WITH PROPOFOL N/A 03/17/2018   Procedure: COLONOSCOPY WITH PROPOFOL;  Surgeon: Jonathon Bellows, MD;  Location: [facility] ENDOSCOPY;  Service: Gastroenterology;  Laterality: N/A;  . DEEP BRAIN STIMULATOR PLACEMENT    . 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 HEMORRHOID SURGERY    . HERNIA REPAIR    . NASAL SINUS SURGERY    . ORCHIECTOMY    . TONSILLECTOMY      There were no vitals filed for this visit.  Subjective Assessment - 06/21/18 1303    Subjective  Pt. and wife states that the visit yesterday went well and that he will get the first nerve block to R knee on 10/07.  Pt. is hopeful that this will  assist in decreasing pain of R knee.    Patient is accompained by:  Family member    Limitations  Lifting;Standing;Walking;House hold activities    Patient Stated Goals  Improve LE strength/ gait and balance with daily tasks.  Return to using cane.      Currently in Pain?  Yes    Pain Score  3     Pain Location  Knee    Pain Orientation  Right    Pain Descriptors / Indicators  Aching    Pain Type  Chronic pain    Pain Onset  1 to 4 weeks ago           Treatment:  Neuro:  Dynamic Balance with UE use for [place] at varying heights and thoracic rotation x multiple bouts in // bars  2 noted LOB occurred while performing exercise. High Knee Marching in // bars x10 Hallway Walking with head turns x8  Pt. Has difficulty with looking up and maintaining balance.  Pt. Tends to go into high-guard position to keep balance. Hallway Walking with changes in gait speed x8  Pt. Has very little increase in cadence when asked to walk quickly. Ambulation outside across varying surfaces (grass, mulch, pavement, curbs, etc.)  Goal reassessment was performed and noted below.       PT Long Term Goals - 06/21/18 1309      PT LONG TERM GOAL #1   Title  Pt. independent with HEP to increase B hip flexion/ abduction strength 1/2 muscle grade to improve pain-free mobility.      Baseline  B quad/ hamtring strength grossly 5/5 MMT and B hip flexion/ abd. 4/5 MMT on R and 3+/5 MMT on L.; B quad/ hamtring strength grossly 5/5 MMT and B hip flexion/ abd. 4/5 MMT on R and 5/5 MMT on L    Time  4    Period  Weeks    Status  Achieved    Target Date  05/24/18      PT LONG TERM GOAL #2   Title  Pt. will increase Berg balance test to >45 out of 56 to improve independence with gait/ decrease fall risk.      Baseline  Berg: 39/56; 8/28 Berg: 43/56; 9/25 Berg: 44/56    Time  4    Period  Weeks    Status  Partially Met    Target Date  08/17/18      PT LONG TERM GOAL #3   Title  Pt. able to  ambulate 100 feet with consistent 2 point gait pattern and use of SPC to improve household mobility.     Baseline  Pt. currently using RW; 8/28 consistent with 2 point gait patter with use of lofstrand crutch.    Time  4    Period  Weeks    Status  Achieved    Target Date  05/24/18      PT LONG TERM GOAL #4   Title  Pt. able to stand from normal chair with no UE assist to improve safety/independence with transfers.     Baseline  extra time and UE assist required to stand; 8/28 needs multiple attempts to rise form chair with min use of UE on legs.; 9/24 can ascend from chair with use of one UE    Time  4    Period  Weeks    Status  Partially Met    Target Date  08/17/18      PT LONG TERM GOAL #5   Title  Pt. able to ambulate on even surface for 100 ft without AD requiring only SBA.    Baseline  Pt. requires CGA with noted LOB when ambulating without AD.    Time  8    Period  Weeks    Status  New    Target Date  08/17/18            Plan - 06/21/18 1831    Clinical Impression Statement  Pt. reported that he felt better today, however had greater instances of LOB when performing balance exercises today.  Two noted LOB occurred when performing cone taps in // bars that were corrected by therapist and pt. grasping onto // bars.  Pt. performed well with weight shift/turns to hand object to therapist.  Pt. has made significant improvements as therapy has progressed, however still has confidence issues that are affect his balance.  Pt. continues to have difficulty with fear of falling.  Pt. will continue to benefit from skilled therapy to address the remaining deficits in balance and strength in order for pt. to return to PLOF and increase QoL.    Clinical Presentation  Evolving    Clinical Decision Making  Moderate    Rehab Potential  Fair    PT Frequency  2x / week    PT Duration  4 weeks    PT Treatment/Interventions  ADLs/Self Care Home Management;Therapeutic activities;Functional  mobility training;Stair training;Gait training;Therapeutic exercise;Balance training;Neuromuscular re-education;Patient/family education;Manual techniques;Passive range of motion    PT Next Visit Plan  Balance/Large Movement/Obstacle Course with no AD.   RECERT NEXT TX    PT Home Exercise Plan  See handouts       Patient will benefit from skilled therapeutic intervention in order to improve the following deficits and impairments:  Abnormal gait, Decreased balance, Decreased endurance, Decreased mobility, Difficulty walking, Decreased range of motion, Decreased activity tolerance, Decreased strength, Impaired flexibility, Postural dysfunction, Pain  Visit Diagnosis: Muscle weakness (generalized)  Gait difficulty  Abnormal posture     Problem List Patient Active Problem List   Diagnosis Date Noted  . 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 HLD (hyperlipidemia) 03/24/2018  . Contusion of right knee 02/14/2018  . Bradycardia 12/28/2017  . 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 Chronic pain of right knee 08/10/2016  . Right ankle pain 08/10/2016  . Venous insufficiency of both lower extremities 05/13/2016  . 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[place]) 12/11/2015  . 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 Lymphoma, 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[place]) 04/03/2015  . Breathlessness on exertion 11/21/2014  . Breath shortness 11/21/2014  . Arthropathy 11/07/2014  . Atrial flutter, paroxysmal ([place]) 11/07/2014  . Type 2 diabetes mellitus ([place]) 11/07/2014  . Benign essential tremor 11/07/2014  . Benign essential HTN 11/07/2014  . Mixed incontinence 11/07/2014  . Hypercholesterolemia without hypertriglyceridemia 11/07/2014  . Apnea, sleep 11/07/2014  . Controlled type 2 diabetes mellitus without complication ([place]) 82/51/8984  . Pure hypercholesterolemia 11/07/2014  . Other abnormalities of gait and mobility 11/02/2011  . Abnormal gait 06/29/2011  . Discoordination 06/29/2011    Pura Spice, PT, DPT # [id number] Gwenlyn Saran, SPT 06/22/2018, 9:41 AM  [facility] [address] [place], [place], [address] Phone: [phone]   Fax:  [phone]  Name: Mike Holloway MRN: [id number] Date of Birth: November 10, 1940

## 2018-06-22 ENCOUNTER — Encounter: Payer: Self-pay | Admitting: Physical Therapy

## 2018-06-26 ENCOUNTER — Encounter: Payer: Self-pay | Admitting: Physical Therapy

## 2018-06-26 ENCOUNTER — Ambulatory Visit: Payer: Medicare Other | Admitting: Physical Therapy

## 2018-06-26 DIAGNOSIS — M6281 Muscle weakness (generalized): Secondary | ICD-10-CM

## 2018-06-26 DIAGNOSIS — R293 Abnormal posture: Secondary | ICD-10-CM

## 2018-06-26 DIAGNOSIS — R269 Unspecified abnormalities of gait and mobility: Secondary | ICD-10-CM

## 2018-06-26 NOTE — Therapy (Signed)
[facility] [address]. [place], [place], [address] Phone: [phone]   Fax:  [phone]  Physical Therapy Treatment  Patient Details  Name: Mike Holloway MRN: [id number] Date of Birth: 1941/01/31 Referring Provider (PT): Dr. Manson Passey   Encounter Date: 06/26/2018  PT End of Session - 06/26/18 1523    Visit Number  16    Number of Visits  24    Date for PT Re-Evaluation  07/24/18    PT Start Time  1504    PT Stop Time  1609    PT Time Calculation (min)  65 min    Equipment Utilized During Treatment  Gait belt    Activity Tolerance  Patient tolerated treatment well    Behavior During Therapy  [facility] for tasks assessed/performed       Past Medical History:  Diagnosis Date  . Arthritis   . Atrial flutter ([place])   . Diabetes mellitus ([place])   . Essential tremor   . Hypercholesteremia   . Hypertension   . Incontinence   . 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 Sleep apnea   . Stroke [facility])     Past Surgical History:  Procedure Laterality Date  . ABLATION    . APPENDECTOMY    . CATARACT EXTRACTION, BILATERAL    . COLONOSCOPY WITH PROPOFOL N/A 03/17/2018   Procedure: COLONOSCOPY WITH PROPOFOL;  Surgeon: Jonathon Bellows, MD;  Location: [facility] ENDOSCOPY;  Service: Gastroenterology;  Laterality: N/A;  . DEEP BRAIN STIMULATOR PLACEMENT    . 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 HEMORRHOID SURGERY    . HERNIA REPAIR    . NASAL SINUS SURGERY    . ORCHIECTOMY    . TONSILLECTOMY      There were no vitals filed for this visit.  Subjective Assessment - 06/26/18 1519    Subjective  Pt. comes into the clinic with eyes closed.  Pt. and wife both report that he was doing much better earlier today, however when he was on the ride over, his eyes started to bother him.      Patient is  accompained by:  Family member    Limitations  Lifting;Standing;Walking;House hold activities    Patient Stated Goals  Improve LE strength/ gait and balance with daily tasks.  Return to using cane.      Currently in Pain?  Yes    Pain Score  2     Pain Location  Knee    Pain Orientation  Right    Pain Descriptors / Indicators  Aching    Pain Onset  1 to 4 weeks ago         [facility] PT Assessment - 06/26/18 0001      Assessment   Medical Diagnosis  Essential Tremor/ Abnormality of gait and mobility.      Referring Provider (PT)  Dr. Manson Passey    Onset Date/Surgical Date  01/10/18      Prior Function   Level of Independence  Requires assistive device for independence        Treatment:  Neuro:  Hallway Walking with head turns x8 Hallway Obstacle Course (step ups, cone toe taps, 4" step-overs, squatting to pick up/put down tennis ball) x8  Pt. Had one LOB episode that came after stepping down from 6" step at end of obstacle  course.  Pt. Required modA to remain upright. Hallway Walking with exaggerated arm swing x4    There Ex:  Standing B Hip Marches x20 each with GTB Standing B Hip Extension x20 each with GTB Standing B Hip Abduction x20 eachwith GTB Lateral Crab Walks in // bars with GTB [employer] in // bars with GTB x8     PT Long Term Goals - 06/26/18 1734      PT LONG TERM GOAL #1   Title  Pt. independent with HEP to increase B hip flexion/ abduction strength 1/2 muscle grade to improve pain-free mobility.      Baseline  B quad/ hamtring strength grossly 5/5 MMT and B hip flexion/ abd. 4/5 MMT on R and 3+/5 MMT on L.; B quad/ hamtring strength grossly 5/5 MMT and B hip flexion/ abd. 4/5 MMT on R and 5/5 MMT on L    Time  4    Period  Weeks    Status  Achieved    Target Date  05/24/18      PT LONG TERM GOAL #2   Title  Pt. will increase Berg balance test to >45 out of 56 to improve independence with gait/ decrease fall risk.      Baseline  Berg:  39/56; 8/28 Berg: 43/56; 9/25 Berg: 44/56    Time  4    Period  Weeks    Status  Partially Met    Target Date  07/24/18      PT LONG TERM GOAL #3   Title  Pt. able to ambulate 100 feet with consistent 2 point gait pattern and use of SPC to improve household mobility.     Baseline  Pt. currently using RW; 8/28 consistent with 2 point gait patter with use of lofstrand crutch.    Time  4    Period  Weeks    Status  Achieved    Target Date  05/24/18      PT LONG TERM GOAL #4   Title  Pt. able to stand from normal chair with no UE assist to improve safety/independence with transfers.     Baseline  extra time and UE assist required to stand; 8/28 needs multiple attempts to rise form chair with min use of UE on legs.; 9/24 can ascend from chair with use of one UE    Time  4    Period  Weeks    Status  Partially Met    Target Date  07/24/18      PT LONG TERM GOAL #5   Title  Pt. able to ambulate on even surface for 100 ft without AD requiring only SBA.    Baseline  Pt. requires CGA with noted LOB when ambulating without AD.    Time  4    Period  Weeks    Status  New    Target Date  07/24/18         Plan - 06/26/18 1544    Clinical Impression Statement  Pt. required sunglasses in order to be able to see during therapy today.  Pt. performed well with balance today compared to last session.  Pt. considerably more stable during obstacle course and was able to complete multiple times with only one instance of LOB.  Pt. is making progress towards goals and will continue to benefit from therapy in order to return to PLOF and be independent with with functional activities.  PT discussed in depth with wife pts. mobility and ability  to travel to [place] next month to see family.  Pt is also scheduled for 1st pain mgmt tx. to R knee next Monday.      Clinical Presentation  Evolving    Clinical Decision Making  Moderate    Rehab Potential  Fair    PT Frequency  2x / week    PT Duration  4 weeks     PT Treatment/Interventions  ADLs/Self Care Home Management;Therapeutic activities;Functional mobility training;Stair training;Gait training;Therapeutic exercise;Balance training;Neuromuscular re-education;Patient/family education;Manual techniques;Passive range of motion    PT Next Visit Plan  Balance/Large Movement/Obstacle Course with no AD.   Check pt. schedule in conjunction with knee appt. next Monday (10/7)    PT Home Exercise Plan  See handouts       Patient will benefit from skilled therapeutic intervention in order to improve the following deficits and impairments:  Abnormal gait, Decreased balance, Decreased endurance, Decreased mobility, Difficulty walking, Decreased range of motion, Decreased activity tolerance, Decreased strength, Impaired flexibility, Postural dysfunction, Pain  Visit Diagnosis: Muscle weakness (generalized)  Gait difficulty  Abnormal posture     Problem List Patient Active Problem List   Diagnosis Date Noted  . 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 HLD (hyperlipidemia) 03/24/2018  . Contusion of right knee 02/14/2018  . Bradycardia 12/28/2017  . 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 Chronic pain of right knee 08/10/2016  . Right ankle pain 08/10/2016  . Venous insufficiency of both lower extremities 05/13/2016  . 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 Lymphoma, 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[place]) 04/03/2015  . Breathlessness on exertion 11/21/2014  . Breath shortness 11/21/2014  . Arthropathy 11/07/2014  . Atrial flutter, paroxysmal ([place]) 11/07/2014  . Type 2 diabetes mellitus ([place]) 11/07/2014  . Benign essential tremor 11/07/2014  . Benign essential HTN 11/07/2014  . Mixed incontinence 11/07/2014  . Hypercholesterolemia without hypertriglyceridemia 11/07/2014  . Apnea, sleep 11/07/2014  . Controlled type 2 diabetes mellitus without complication ([place]) 18/56/3149  . Pure  hypercholesterolemia 11/07/2014  . Other abnormalities of gait and mobility 11/02/2011  . Abnormal gait 06/29/2011  . Discoordination 06/29/2011   Pura Spice, PT, DPT # [id number] Gwenlyn Saran, SPT 06/26/2018, 5:53 PM  [facility] [address] [place], [place], [address] Phone: [phone]   Fax:  [phone]  Name: Delno Blaisdell MRN: [id number] Date of Birth: 03-Jun-1941

## 2018-06-28 ENCOUNTER — Ambulatory Visit: Payer: Medicare Other | Attending: Neurosurgery | Admitting: Physical Therapy

## 2018-06-28 DIAGNOSIS — R269 Unspecified abnormalities of gait and mobility: Secondary | ICD-10-CM | POA: Diagnosis present

## 2018-06-28 DIAGNOSIS — M6281 Muscle weakness (generalized): Secondary | ICD-10-CM | POA: Insufficient documentation

## 2018-06-28 DIAGNOSIS — R293 Abnormal posture: Secondary | ICD-10-CM | POA: Diagnosis present

## 2018-06-28 NOTE — Therapy (Signed)
[facility] [address] [place], [place], [address] Phone: [phone]   Fax:  [phone]  Physical Therapy Treatment  Patient Details  Name: Mike Holloway MRN: [id number] Date of Birth: 05/09/1941 Referring Provider (PT): Dr. Manson Passey   Encounter Date: 06/28/2018  PT End of Session - 06/28/18 1453    Visit Number  17    Number of Visits  24    Date for PT Re-Evaluation  07/24/18    PT Start Time  1446    PT Stop Time  1542    PT Time Calculation (min)  56 min    Equipment Utilized During Treatment  Gait belt    Activity Tolerance  Patient tolerated treatment well    Behavior During Therapy  [facility] for tasks assessed/performed       Past Medical History:  Diagnosis Date  . Arthritis   . Atrial flutter ([place])   . Diabetes mellitus ([place])   . Essential tremor   . Hypercholesteremia   . Hypertension   . Incontinence   . 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 Sleep apnea   . Stroke [facility])     Past Surgical History:  Procedure Laterality Date  . ABLATION    . APPENDECTOMY    . CATARACT EXTRACTION, BILATERAL    . COLONOSCOPY WITH PROPOFOL N/A 03/17/2018   Procedure: COLONOSCOPY WITH PROPOFOL;  Surgeon: Jonathon Bellows, MD;  Location: [facility] ENDOSCOPY;  Service: Gastroenterology;  Laterality: N/A;  . DEEP BRAIN STIMULATOR PLACEMENT    . 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 HEMORRHOID SURGERY    . HERNIA REPAIR    . NASAL SINUS SURGERY    . ORCHIECTOMY    . TONSILLECTOMY      There were no vitals filed for this visit.  Subjective Assessment - 06/28/18 1447    Subjective  Pt. reports that he has been working on geneology today and has been pretty inactive.  Pt. is oging to have several Dr's. appointment next week.  Pt. states today has been a generally good day with his eyes.     Patient is accompained by:  Family member    Limitations  Lifting;Standing;Walking;House hold activities    Patient Stated Goals  Improve LE strength/ gait and balance with daily tasks.  Return to using cane.      Currently in Pain?  Yes    Pain Score  2     Pain Location  Knee    Pain Orientation  Right    Pain Descriptors / Indicators  Aching    Pain Type  Chronic pain    Pain Onset  1 to 4 weeks ago            Treatment:  Neuro:  Standing Cone Taps in // bars with CGA x multiple bouts Standing Dynamic Reaching with Cones on table at various heights x mutliple bouts Standing Dynamic Reaching with Cones on table at various heights and resistance provided by 2 BlackTB around waist x multiple bouts  All dynamic reaching exercises performed in // bars with CGA for safety Walking around gym with no AD CGA x4     There Ex:  Standing B Hip Marchesx20each with 5# cuff weight Standing B Hip Extensionx20 each with 5# cuff weight Standing B Hip Abductionx20 each with 5# cuff weight  Standing B Hip Extension at 45 deg posteriolateral x20 each with 5# cuff weight Resisted walking in // bars with two BlackTB (Front/Back/Lateral) x5 each    MH applied at conclusion of therapy session to R knee (15 min unbilled)        PT Long Term Goals - 06/26/18 1734      PT LONG TERM GOAL #1   Title  Pt. independent with HEP to increase B hip flexion/ abduction strength 1/2 muscle grade to improve pain-free mobility.      Baseline  B quad/ hamtring strength grossly 5/5 MMT and B hip flexion/ abd. 4/5 MMT on R and 3+/5 MMT on L.; B quad/ hamtring strength grossly 5/5 MMT and B hip flexion/ abd. 4/5 MMT on R and 5/5 MMT on L    Time  4    Period  Weeks    Status  Achieved    Target Date  05/24/18      PT LONG TERM GOAL #2   Title  Pt. will increase Berg balance test to >45 out of 56 to improve independence with gait/ decrease fall risk.      Baseline  Berg: 39/56; 8/28  Berg: 43/56; 9/25 Berg: 44/56    Time  4    Period  Weeks    Status  Partially Met    Target Date  07/24/18      PT LONG TERM GOAL #3   Title  Pt. able to ambulate 100 feet with consistent 2 point gait pattern and use of SPC to improve household mobility.     Baseline  Pt. currently using RW; 8/28 consistent with 2 point gait patter with use of lofstrand crutch.    Time  4    Period  Weeks    Status  Achieved    Target Date  05/24/18      PT LONG TERM GOAL #4   Title  Pt. able to stand from normal chair with no UE assist to improve safety/independence with transfers.     Baseline  extra time and UE assist required to stand; 8/28 needs multiple attempts to rise form chair with min use of UE on legs.; 9/24 can ascend from chair with use of one UE    Time  4    Period  Weeks    Status  Partially Met    Target Date  07/24/18      PT LONG TERM GOAL #5   Title  Pt. able to ambulate on even surface for 100 ft without AD requiring only SBA.    Baseline  Pt. requires CGA with noted LOB when ambulating without AD.    Time  4    Period  Weeks    Status  New    Target Date  07/24/18            Plan - 06/28/18 1728    Clinical Impression Statement  Pt. performed well today with balance exercises and was able to increase dynamic activities while in standing.  Pt. performed dynamic reaches while resistance was applied via 2 Black Therabands around waist in // bars.  Pt. performed well with added resistance, noting only 1 LOB.  Pt. is becoming more aware and is able to correct posture to prevent falls more frequently.  Pt. to benefit from incorporporating dynamic activities in future sessions along with perterbations.      Clinical Presentation  Evolving    Clinical Decision Making  Moderate  Rehab Potential  Fair    PT Frequency  2x / week    PT Duration  4 weeks    PT Treatment/Interventions  ADLs/Self Care Home Management;Therapeutic activities;Functional mobility training;Stair  training;Gait training;Therapeutic exercise;Balance training;Neuromuscular re-education;Patient/family education;Manual techniques;Passive range of motion    PT Next Visit Plan  Balance/Large Movement/Obstacle Course with no AD.   Check pt. schedule in conjunction with knee appt. next Monday (10/7)    PT Home Exercise Plan  See handouts       Patient will benefit from skilled therapeutic intervention in order to improve the following deficits and impairments:  Abnormal gait, Decreased balance, Decreased endurance, Decreased mobility, Difficulty walking, Decreased range of motion, Decreased activity tolerance, Decreased strength, Impaired flexibility, Postural dysfunction, Pain  Visit Diagnosis: Muscle weakness (generalized)  Gait difficulty  Abnormal posture     Problem List Patient Active Problem List   Diagnosis Date Noted  . 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 HLD (hyperlipidemia) 03/24/2018  . Contusion of right knee 02/14/2018  . Bradycardia 12/28/2017  . 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 Chronic pain of right knee 08/10/2016  . Right ankle pain 08/10/2016  . Venous insufficiency of both lower extremities 05/13/2016  . 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[place]) 12/11/2015  . 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 Lymphoma, 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[place]) 04/03/2015  . Breathlessness on exertion 11/21/2014  . Breath shortness 11/21/2014  . Arthropathy 11/07/2014  . Atrial flutter, paroxysmal ([place]) 11/07/2014  . Type 2 diabetes mellitus ([place]) 11/07/2014  . Benign essential tremor 11/07/2014  . Benign essential HTN 11/07/2014  . Mixed incontinence 11/07/2014  . Hypercholesterolemia without hypertriglyceridemia 11/07/2014  . Apnea, sleep 11/07/2014  . Controlled type 2 diabetes mellitus without complication ([place]) 41/04/5789  . Pure hypercholesterolemia 11/07/2014  . Other abnormalities of gait and mobility 11/02/2011  . Abnormal gait 06/29/2011  .  Discoordination 06/29/2011   Pura Spice, PT, DPT # [id number] Gwenlyn Saran, SPT 06/28/2018, 7:01 PM  [facility] [address] [place], [place], [address] Phone: [phone]   Fax:  [phone]  Name: Mike Holloway MRN: [id number] Date of Birth: [birth year]

## 2018-06-29 ENCOUNTER — Encounter: Payer: Self-pay | Admitting: Physical Therapy

## 2018-07-03 ENCOUNTER — Ambulatory Visit (HOSPITAL_BASED_OUTPATIENT_CLINIC_OR_DEPARTMENT_OTHER): Payer: Medicare Other | Admitting: Student in an Organized Health Care Education/Training Program

## 2018-07-03 ENCOUNTER — Encounter: Payer: Self-pay | Admitting: Student in an Organized Health Care Education/Training Program

## 2018-07-03 ENCOUNTER — Ambulatory Visit
Admission: RE | Admit: 2018-07-03 | Discharge: 2018-07-03 | Disposition: A | Discharge status: Home / Self Care | Payer: Medicare Other | Source: Ambulatory Visit | Attending: Student in an Organized Health Care Education/Training Program | Admitting: Student in an Organized Health Care Education/Training Program

## 2018-07-03 VITALS — BP 128/72 | HR 58 | Temp 98.3°F | Resp 12 | Ht 68.75 in | Wt 193.0 lb

## 2018-07-03 DIAGNOSIS — Z882 Allergy status to sulfonamides status: Secondary | ICD-10-CM | POA: Insufficient documentation

## 2018-07-03 DIAGNOSIS — Z880 Allergy status to penicillin: Secondary | ICD-10-CM | POA: Insufficient documentation

## 2018-07-03 DIAGNOSIS — M1711 Unilateral primary osteoarthritis, right knee: Secondary | ICD-10-CM | POA: Diagnosis not present

## 2018-07-03 DIAGNOSIS — Z881 Allergy status to other antibiotic agents status: Secondary | ICD-10-CM | POA: Diagnosis not present

## 2018-07-03 DIAGNOSIS — G8929 Other chronic pain: Secondary | ICD-10-CM | POA: Diagnosis present

## 2018-07-03 DIAGNOSIS — Z888 Allergy status to other drugs, medicaments and biological substances status: Secondary | ICD-10-CM | POA: Insufficient documentation

## 2018-07-03 DIAGNOSIS — M25561 Pain in right knee: Secondary | ICD-10-CM

## 2018-07-03 MED ORDER — DEXAMETHASONE SODIUM PHOSPHATE 10 MG/ML IJ SOLN
INTRAMUSCULAR | Status: AC
  Filled 2018-07-03: qty 1

## 2018-07-03 MED ORDER — DEXAMETHASONE SODIUM PHOSPHATE 10 MG/ML IJ SOLN
10.0000 mg | Freq: Once | INTRAMUSCULAR | Status: AC
  Administered 2018-07-03: 10 mg

## 2018-07-03 MED ORDER — LIDOCAINE HCL 2 % IJ SOLN
20.0000 mL | Freq: Once | INTRAMUSCULAR | Status: AC
  Administered 2018-07-03: 400 mg

## 2018-07-03 MED ORDER — ROPIVACAINE HCL 2 MG/ML IJ SOLN
INTRAMUSCULAR | Status: AC
  Filled 2018-07-03: qty 10

## 2018-07-03 MED ORDER — ROPIVACAINE HCL 2 MG/ML IJ SOLN
10.0000 mL | Freq: Once | INTRAMUSCULAR | Status: AC
  Administered 2018-07-03: 10 mL

## 2018-07-03 MED ORDER — LIDOCAINE HCL 2 % IJ SOLN
INTRAMUSCULAR | Status: AC
  Filled 2018-07-03: qty 20

## 2018-07-03 NOTE — Progress Notes (Signed)
Patient's Name: Mike Holloway  MRN: [id number]  Referring Provider: [name], MD  DOB: 1940/11/24  PCP: [name], MD  DOS: 07/03/2018  Note by: Gillis Santa, MD  Service setting: Ambulatory outpatient  Specialty: Interventional Pain Management  Patient type: Established  Location: [facility] (AMB) Pain Management Facility  Visit type: Interventional Procedure   Primary Reason for Visit: Interventional Pain Management Treatment. CC: Knee Pain (right )  Procedure:          Anesthesia, Analgesia, Anxiolysis:  Type: Genicular Nerves Block (Superior-lateral, Superior-medial, and Inferior-medial Genicular Nerves) #1  CPT: 40981      Primary Purpose: Diagnostic Region: Lateral, Anterior, and Medial aspects of the knee joint, above and below the knee joint proper. Level: Superior and inferior to the knee joint. Target Area: For Genicular Nerve block(s), the targets are: the superior-lateral genicular nerve, located in the lateral distal portion of the femoral shaft as it curves to form the lateral epicondyle, in the region of the distal femoral metaphysis; the superior-medial genicular nerve, located in the medial distal portion of the femoral shaft as it curves to form the medial epicondyle; and the inferior-medial genicular nerve, located in the medial, proximal portion of the tibial shaft, as it curves to form the medial epicondyle, in the region of the proximal tibial metaphysis. Approach: Anterior, percutaneous, ipsilateral approach. Laterality: Right knee  Type: Local Anesthesia Indication(s): Analgesia         Route: Infiltration (Obetz/IM) IV Access: Declined Sedation: Declined  Local Anesthetic: Lidocaine 1-2%  Position: Modified Fowler's position with pillows under the targeted knee(s).   Indications: 1. Primary osteoarthritis of right knee   2. Chronic pain of right knee    Pain Score: Pre-procedure: 2 /10 Post-procedure: 0-No pain/10  Pre-op Assessment:  Mr.  Holloway is a 77 y.o. (year old), male patient, seen today for interventional treatment. 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 Mike Holloway has a current medication list which includes the following prescription(s): cholecalciferol, famotidine, fluoxetine, mirabegron er, montelukast, NON FORMULARY, primidone, propranolol er, tamsulosin, triamcinolone acetonide, triamterene-hydrochlorothiazide, diclofenac sodium, neomycin-polymyxin b-dexamethasone, and probiotic product. His primarily concern today is the Knee Pain (right )  Initial Vital Signs:  Pulse/HCG Rate: (!) 58  Temp: 98.3 F (36.8 C) Resp: 16 BP: 132/78 SpO2: 97 %  BMI: Estimated body mass index is 28.71 kg/m as calculated from the following:   Height as of this encounter: 5' 8.75" (1.746 m).   Weight as of this encounter: 193 lb (87.5 kg).  Risk Assessment: Allergies: Reviewed. He is allergic to clindamycin; flagyl [metronidazole]; ambien  [zolpidem]; penicillins; and sulfa antibiotics.  Allergy Precautions: None required Coagulopathies: Reviewed. None identified.  Blood-thinner therapy: None at this time Active Infection(s): Reviewed. None identified. Mike Holloway is afebrile  Site Confirmation: Mike Holloway was asked to confirm the procedure and laterality before marking the site Procedure checklist: Completed Consent: Before the procedure and under the influence of no sedative(s), amnesic(s), or anxiolytics, the patient was informed of the treatment options, risks and possible complications. To fulfill our ethical and legal obligations, as recommended by the American Medical Association's Code of Ethics, I have informed the patient of my clinical impression; the nature and  purpose of the treatment or procedure; the risks, benefits, and possible complications of the intervention; the alternatives, including doing  nothing; the risk(s) and benefit(s) of the alternative treatment(s) or procedure(s); and the risk(s) and benefit(s) of doing nothing. The patient was provided information about the general risks and possible complications associated with the procedure. These may include, but are not limited to: failure to achieve desired goals, infection, bleeding, organ or nerve damage, allergic reactions, paralysis, and death. In addition, the patient was informed of those risks and complications associated to the procedure, such as failure to decrease pain; infection; bleeding; organ or nerve damage with subsequent damage to sensory, motor, and/or autonomic systems, resulting in permanent pain, numbness, and/or weakness of one or several areas of the body; allergic reactions; (i.e.: anaphylactic reaction); and/or death. Furthermore, the patient was informed of those risks and complications associated with the medications. These include, but are not limited to: allergic reactions (i.e.: anaphylactic or anaphylactoid reaction(s)); adrenal axis suppression; blood sugar elevation that in diabetics may result in ketoacidosis or comma; water retention that in patients with history of congestive heart failure may result in shortness of breath, pulmonary edema, and decompensation with resultant heart failure; weight gain; swelling or edema; medication-induced neural toxicity; particulate matter embolism and blood vessel occlusion with resultant organ, and/or nervous system infarction; and/or aseptic necrosis of one or more joints. Finally, the patient was informed that Medicine is not an exact science; therefore, there is also the possibility of unforeseen or unpredictable risks and/or possible complications that may result in a catastrophic outcome. The patient indicated having understood very  clearly. We have given the patient no guarantees and we have made no promises. Enough time was given to the patient to ask questions, all of which were answered to the patient's satisfaction. Mr. Nakamura has indicated that he wanted to continue with the procedure. Attestation: I, the ordering provider, attest that I have discussed with the patient the benefits, risks, side-effects, alternatives, likelihood of achieving goals, and potential problems during recovery for the procedure that I have provided informed consent. Date  Time: 07/03/2018  8:43 AM  Pre-Procedure Preparation:  Monitoring: As per clinic protocol. Respiration, ETCO2, SpO2, BP, heart rate and rhythm monitor placed and checked for adequate function Safety Precautions: Patient was assessed for positional comfort and pressure points before starting the procedure. Time-out: I initiated and conducted the "Time-out" before starting the procedure, as per protocol. The patient was asked to participate by confirming the accuracy of the "Time Out" information. Verification of the correct person, site, and procedure were performed and confirmed by me, the nursing staff, and the patient. "Time-out" conducted as per Joint Commission's Universal Protocol (UP.01.01.01). Time: 0958  Description of Procedure:          Area Prepped: Entire knee area, from mid-thigh to mid-shin, lateral, anterior, and medial aspects. Prepping solution: ChloraPrep (2% chlorhexidine gluconate and 70% isopropyl alcohol) Safety Precautions: Aspiration looking for blood return was conducted prior to all injections. At no point did we inject any substances, as a needle was being advanced. No attempts were made at seeking any paresthesias. Safe injection practices and needle disposal techniques used. Medications properly checked for expiration dates. SDV (single dose vial) medications used. Latex Allergy precautions taken.   Description of the Procedure: Protocol guidelines were  followed. The patient was placed in position over the procedure table. The target area was identified and the area prepped in the usual manner. Skin & deeper tissues infiltrated with local anesthetic. Appropriate amount of time allowed to pass for local anesthetics to take effect. The procedure needles were then advanced to  the target area. Proper needle placement secured. Negative aspiration confirmed. Solution injected in intermittent fashion, asking for systemic symptoms every 0.5cc of injectate. The needles were then removed and the area cleansed, making sure to leave some of the prepping solution back to take advantage of its long term bactericidal properties.  Vitals:   07/03/18 0950 07/03/18 1000 07/03/18 1005 07/03/18 1015  BP: 126/84 121/81 132/75 128/72  Pulse: (!) 58 (!) 59 (!) 58 (!) 58  Resp: 13 15 11 12   Temp:      TempSrc:      SpO2: 98% 97% 99% 100%  Weight:      Height:        Start Time: 0958 hrs. End Time: 1008 hrs. Materials:  Needle(s) Type: Spinal Needle Gauge: 25G Length: 3.5-in Medication(s): Please see orders for medications and dosing details. 5 cc solution made of 4 cc of 0.2% ropivacaine, 1 cc of Decadron 10 mg/cc.  1.5 cc injected at each level above. Imaging Guidance (Non-Spinal):          Type of Imaging Technique: Fluoroscopy Guidance (Non-Spinal) Indication(s): Assistance in needle guidance and placement for procedures requiring needle placement in or near specific anatomical locations not easily accessible without such assistance. Exposure Time: Please see nurses notes. Contrast: Before injecting any contrast, we confirmed that the patient did not have an allergy to iodine, shellfish, or radiological contrast. Once satisfactory needle placement was completed at the desired level, radiological contrast was injected. Contrast injected under live fluoroscopy. No contrast complications. See chart for type and volume of contrast used. Fluoroscopic Guidance: I  was personally present during the use of fluoroscopy. "Tunnel Vision Technique" used to obtain the best possible view of the target area. Parallax error corrected before commencing the procedure. "Direction-depth-direction" technique used to introduce the needle under continuous pulsed fluoroscopy. Once target was reached, antero-posterior, oblique, and lateral fluoroscopic projection used confirm needle placement in all planes. Images permanently stored in EMR. Interpretation: I personally interpreted the imaging intraoperatively. Adequate needle placement confirmed in multiple planes. Appropriate spread of contrast into desired area was observed. No evidence of afferent or efferent intravascular uptake. Permanent images saved into the patient's record.  Antibiotic Prophylaxis:   Anti-infectives (From admission, onward)   None     Indication(s): None identified  Post-operative Assessment:  Post-procedure Vital Signs:  Pulse/HCG Rate: (!) 58  Temp: 98.3 F (36.8 C) Resp: 12 BP: 128/72 SpO2: 100 %  EBL: None  Complications: No immediate post-treatment complications observed by team, or reported by patient.  Note: The patient tolerated the entire procedure well. A repeat set of vitals were taken after the procedure and the patient was kept under observation following institutional policy, for this type of procedure. Post-procedural neurological assessment was performed, showing return to baseline, prior to discharge. The patient was provided with post-procedure discharge instructions, including a section on how to identify potential problems. Should any problems arise concerning this procedure, the patient was given instructions to immediately contact us, at any time, without hesitation. In any case, we plan to contact the patient by telephone for a follow-up status report regarding this interventional procedure.  Comments:  No additional relevant information.  Plan of Care    Imaging  Orders     DG C-Arm 1-60 Min-No Report Procedure Orders    No procedure(s) ordered today    Medications ordered for procedure: Meds ordered this encounter  Medications  . lidocaine (XYLOCAINE) 2 % (with pres) injection 400 mg  . ropivacaine (  PF) 2 mg/mL (0.2%) (NAROPIN) injection 10 mL  . dexamethasone (DECADRON) injection 10 mg   Medications administered: We administered lidocaine, ropivacaine (PF) 2 mg/mL (0.2%), and dexamethasone.  See the medical record for exact dosing, route, and time of administration.  Disposition: Discharge home  Discharge Date & Time: 07/03/2018; 1017 hrs.   Physician-requested Follow-up: Return in about 4 weeks (around 07/31/2018) for Post Procedure Evaluation.  Future Appointments  Date Time Provider [place]  07/04/2018  2:30 PM Pura Spice, PT [facility] None  07/06/2018  2:30 PM Pura Spice, PT [facility] None  07/11/2018  2:30 PM Pura Spice, PT [facility] None  07/13/2018  2:30 PM Pura Spice, PT [facility] None  07/17/2018  2:30 PM Pura Spice, PT [facility] None  07/19/2018  2:30 PM Pura Spice, PT [facility] None  08/07/2018  1:30 PM Gillis Santa, MD [facility] None  09/14/2018 10:45 AM [facility] None   Primary Care Physician: [name], MD Location: [facility] Note by: Gillis Santa, MD Date: 07/03/2018; Time: 1:14 PM  Disclaimer:  Medicine is not an exact science. The only guarantee in medicine is that nothing is guaranteed. It is important to note that the decision to proceed with this intervention was based on the information collected from the patient. The Data and conclusions were drawn from the patient's questionnaire, the interview, and the physical examination. Because the information was provided in large part by the patient, it cannot be guaranteed that it has not been purposely or unconsciously manipulated. Every effort has been made to  obtain as much relevant data as possible for this evaluation. It is important to note that the conclusions that lead to this procedure are derived in large part from the available data. Always take into account that the treatment will also be dependent on availability of resources and existing treatment guidelines, considered by other Pain Management Practitioners as being common knowledge and practice, at the time of the intervention. For Medico-Legal purposes, it is also important to point out that variation in procedural techniques and pharmacological choices are the acceptable norm. The indications, contraindications, technique, and results of the above procedure should only be interpreted and judged by a Board-Certified Interventional Pain Specialist with extensive familiarity and expertise in the same exact procedure and technique.

## 2018-07-03 NOTE — Patient Instructions (Signed)
______________________________________________________________________________________________  Specialist's Pain Scale  Introduction:  The pain scale used by our Pain Specialists is different from that used by non-specialist.  It has 11 levels with "0" being no pain.  As you will learn, levels "6" through "10" do not belong in an outpatient pain facility. Learn and use this scale.  General Information:  The pain score should reflect your current level of pain at the time you are being asked. (Amount of pain you have NOW). Unless asked about your worst pain, or average pain, we will always be referring to your current pain.  Definition:  ADL: (Activities of Daily Living) This refers to basic activities such as bathing, brushing your teeth, getting dressed, eating, getting out of bed or a chair, and using restroom.  Instructions: Always describe your pain, based on what it allows you to do. Read below.     Score Level Description  0 No Pain Pain Description: No pain.    Effects on ADLs: None    Physiologic Effects: None    Treatment Location: Outpatient Pain Facility      1 Mild Pain Description: Nagging, annoying    Effects on ADLs: Does not interfere with ability to eat, bathe, get dressed, use the toilet without assistance, move in and out of bed or chair, or control your bowel and/or bladder. Ability to do house work and to maintain gainful employment is retained.    Physiologic Effects: Blood pressure and heart rate are seldom affected.    Treatment Location: Outpatient Pain Facility      2 Mild to Pain Description: Nagging, annoying, + noticeable and distracting.   Moderate ADL Effects: Frequent flare-ups. Possible to adapt and function. Still able to eat, bathe, get dressed, use the toilet, transfer in or out of bed/chair without assistance. Ability to do house work and to maintain gainful employment is still possible.    Physiologic Effects: Blood pressure and heart rate may be  affected with flare-ups. Bowel and/or bladder control is still unaffected.     Treatment Location: Outpatient Pain Facility      3 Moderate Pain Description: Nagging, annoying, noticeable, + very distracting & difficult to ignore.    ADL Effects: ADL such as bathing, getting dressed, cooking, getting out of bed and getting up from a chair takes additional effort. Ability to do house work and to maintain gainful employment may be diminished.    Physiologic Effects: Baseline blood pressure and heart rate may become elevated.     Treatment Location: Outpatient Pain Facility      4 Moderate to Severe Pain Description: Nagging, annoying, noticeable, very distracting, + impossible to ignore and very difficult to concentrate.    ADL Effects: With effort, patients may still be able to manage work or participate in some social activities. Patients find relief in laying down and not moving.    Physiologic Effects: Signs of autonomic nervous system discharge are evident: dilated pupils (mydriasis); mild sweating (diaphoresis); sleep interference. Heart rate becomes elevated (>115 bpm). Diastolic blood pressure (lower number) rises above 100 mmHg.    Treatment Location: Outpatient Pain Facility      5 Severe Pain Description: Nagging, annoying, noticeable, very distracting, impossible to ignore and very difficult to concentrate. + Pain is intense and extremely unpleasant.    ADL Effects: Associated with frowning face and frequent crying. Pain overwhelms the senses.  Ability to do any activity or maintain social relationships becomes significantly limited. Conversation becomes difficult. Pacing back and forth is common,  as getting into a comfortable position is nearly impossible. Pain wakes you up from deep sleep.     Physiologic Effects: Physical signs will be obvious: pupillary dilation; increased sweating; goosebumps; brisk reflexes; cold, clammy hands and feet; nausea, vomiting or dry heaves; loss of appetite;  significant sleep disturbance with inability to fall asleep or to remain asleep. When persistent, significant weight loss is observed due to the complete loss of appetite and sleep deprivation.  Blood pressure and heart rate becomes significantly elevated. Caution: If elevated blood pressure triggers a pounding headache associated with blurred vision, then the patient should immediately seek attention at an urgent or emergency care unit, as these may be signs of an impending stroke.    Treatment Location: Outpatient Pain Facility      6 Distressing Pain Description: Severely limiting.   This is  an emergency ADL Effects:   room pain level Physiologic Effects:     Treatment Location: Inpatient Emergency Department. Requires emergency care and should not be seen or managed at an outpatient pain management facility.       7 Disabling Pain Description:     ADL Effects:     Physiologic Effects:     Treatment Location: Inpatient Emergency Department. Requires emergency care and should not be seen or managed at an outpatient pain management facility.       8 Incapacitating Pain Description:     ADL Effects:     Physiologic Effects:     Treatment Location: Inpatient Emergency Department. Requires emergency care and should not be seen or managed at an outpatient pain management facility.       9 Worst pain Pain Description:    imaginable ADL Effects:     Physiologic Effects:     Treatment Location: Inpatient Emergency Department. Requires emergency care and should not be seen or managed at an outpatient pain management facility.       10 Patients will Pain Description: At this level, most patients pass out from pain.    pass out ADL Effects:     Physiologic Effects: When this level is reached, collapse of the autonomic nervous system occurs, leading to a sudden drop in blood pressure and heart rate. This in turn results in a temporary and dramatic drop in blood flow to the brain, leading to a loss  of consciousness. Fainting is one of the body's self defense mechanisms. Passing out puts the brain in a calmed state and causes it to shut down for a while, in order to begin the healing process.    Treatment Location: EMS (Emergency Medical Services) are required. Requires emergency care and should not be seen or managed at an outpatient pain management facility.         Emergency Department Pain Levels (6-10/10)  Emergency Room Pain 6 Communication becomes difficult and requires great effort. Assistance to reach the emergency department may be required. Facial flushing and profuse sweating along with potentially dangerous increases in heart rate and blood pressure will be evident.   Distressing 7 Self-care is very difficult. Assistance is required to transport, or use restroom. Assistance to reach the emergency department will be required. Tasks requiring coordination, such as bathing and getting dressed become very difficult.   Disabling 8 Self-care is no longer possible. At this level, pain is disabling. The individual is unable to do even the most "basic" activities such as walking, eating, bathing, dressing, transferring to a bed, or toileting. Fine motor skills are lost. It is difficult  to think clearly.   Incapacitating 9 Pain becomes incapacitating. Thought processing is no longer possible. Difficult to remember your own name. Control of movement and coordination are lost.    Summary: 1. Refer to this scale when providing [place] with your pain level. 2. Be accurate and careful when reporting your pain level. This will help with your care. 3. Over-reporting your pain level will lead to loss of credibility. 4. Even a level of 1/10 means that there is pain and will be treated at our facility. 5. High, inaccurate reporting will be documented as "Symptom Exaggeration", leading to loss of credibility and suspicions of possible secondary gains such as obtaining more narcotics, or wanting to appear  disabled, for fraudulent reasons. 6. Only pain levels of 5 or below will be seen at our facility. 7. Pain levels of 6 and above will be sent to the Emergency Department and the appointment cancelled. ____________________________________________________________________________________________  ____________________________________________________________________________________________  Post-Procedure Information  What to expect: Most procedures involve the use of a local anesthetic (numbing medicine), and a steroid (anti-inflammatory medicine).  The local anesthetics may cause temporary numbness and weakness of the legs or arms, depending on the location of the block. This numbness/weakness may last 4-6 hours, depending on the local anesthetic used. In rare instances, it can last up to 24 hours. While numb, you must be very careful not to injure the extremity.  After any procedure, you could expect the pain to get better within 15-20 minutes. This relief is temporary and may last 4-6 hours. Once the local anesthetics wears off, you could experience discomfort, possibly more than usual, for up to 10 (ten) days. In the case of radiofrequencies, it may last up to 6 weeks. Surgeries may take up to 8 weeks for the healing process. The discomfort is due to the irritation caused by needles going through skin and muscle. To minimize the discomfort, we recommend using ice the first day, and heat from then on. The ice should be applied for 15 minutes on, and 15 minutes off. Keep repeating this cycle until bedtime. Avoid applying the ice directly to the skin, to prevent frostbite. Heat should be used daily, until the pain improves (4-10 days). Be careful not to burn yourself.  Occasionally you may experience muscle spasms or cramps. These occur as a consequence of the irritation caused by the needle sticks to the muscle and the blood that will inevitably be lost into the surrounding muscle tissue. Blood tends to  be very irritating to tissues, which tend to react by going into spasm. These spasms may start the same day of your procedure, but they may also take days to develop. This late onset type of spasm or cramp is usually caused by electrolyte imbalances triggered by the steroids, at the level of the kidney. Cramps and spasms tend to respond well to muscle relaxants, multivitamins (some are triggered by the procedure, but may have their origins in vitamin deficiencies), and "Gatorade", or any sports drinks that can replenish any electrolyte imbalances. (If you are a diabetic, ask your pharmacist to get you a sugar-free brand.) Warm showers or baths may also be helpful. Stretching exercises are highly recommended.  General Instructions:  Be alert for signs of possible infection: redness, swelling, heat, red streaks, elevated temperature, and/or fever. These typically appear 4 to 6 days after the procedure. Immediately notify your doctor if you experience unusual bleeding, difficulty breathing, or loss of bowel or bladder control. If you experience increased pain, do not  increase your pain medicine intake, unless instructed by your pain physician.  Post-Procedure Care:  Be careful in moving about. Muscle spasms in the area of the injection may occur. Applying ice or heat to the area is often helpful. The incidence of spinal headaches after epidural injections ranges between 1.4% and 6%. If you develop a headache that does not seem to respond to conservative therapy, please let your physician know. This can be treated with an epidural blood patch.   Post-procedure numbness or redness is to be expected, however it should average 4 to 6 hours. If numbness and weakness of your extremities begins to develop 4 to 6 hours after your procedure, and is felt to be progressing and worsening, immediately contact your physician.    Diet:  If you experience nausea, do not eat until this sensation goes away. If you had a  "Stellate Ganglion Block" for upper extremity "Reflex Sympathetic Dystrophy", do not eat or drink until your hoarseness goes away. In any case, always start with liquids first and if you tolerate them well, then slowly progress to more solid foods.  Activity:  For the first 4 to 6 hours after the procedure, use caution in moving about as you may experience numbness and/or weakness. Use caution in cooking, using household electrical appliances, and climbing steps.  If you need to reach your Doctor call our office: [phone]) [phone] Monday-Thursday 8:00 am - 4:00 PM    Fridays: Closed      In case of an emergency: In case of emergency, call 911 or go to the nearest emergency room and have the physician there call us.  Interpretation of Procedure Every nerve block has two components: a diagnostic component, and a treatment component. Unrealistic expectations are the most common causes of "perceived failure".  In a perfect world, a single nerve block should be able to completely and permanently eliminate the pain. Sadly, the world is not perfect.  Most pain management nerve blocks are performed using local anesthetics and steroids. Steroids are responsible for any long-term benefit that you may experience. Their purpose is to decrease any chronic swelling that may exist in the area. Steroids begin to work immediately after being injected. However, most patients will not experience any benefits until 5 to 10 days after the injection, when the swelling has come down to the point where they can tell a difference. Steroids will only help if there is swelling to be treated. As such, they can assist with the diagnosis. If effective, they suggest an inflammatory component to the pain, and if ineffective, they rule out inflammation as the main cause or component of the problem. If the problem is one of mechanical compression, you will get no benefit from those steroids.   In the case of local anesthetics, they  have a crucial role in the diagnosis of your condition. Most will begin to work within15 to 20 minutes after injection. The duration will depend on the type used (short- vs. Long-acting). It is of outmost importance that patients keep tract of their pain, after the procedure. To assist with this matter, a "Post-procedure Pain Diary" is provided. Make sure to complete it and to bring it back to your follow-up appointment.  As long as the patient keeps accurate, detailed records of their symptoms after every procedure, and returns to have those interpreted, every procedure will provide [place] with invaluable information. Even a block that does not provide the patient with any relief, will always provide [place] with information  about the mechanism and the origin of the pain. The only time a nerve block can be considered a waste of time is when patients do not keep track of the results, or do not keep their post-procedure appointment.  Reporting the results back to your physician The Pain Score  Pain is a subjective complaint. It cannot be seen, touched, or measured. We depend entirely on the patient's report of the pain in order to assess your condition and treatment. To evaluate the pain, we use a pain scale, where "0" means "No Pain", and a "10" is "the worst possible pain that you can even imagine" (i.e. something like been eaten alive by a shark or being torn apart by a lion).   You will frequently be asked to rate your pain. Please be as accurate, remember that medical decisions will be based on your responses. Please do not rate your pain above a 10. Doing so is actually interpreted as "symptom magnification" (exaggeration), as well as lack of understanding with regards to the scale. To put this into perspective, when you tell us that your pain is at a 10 (ten), what you are saying is that there is nothing we can do to make this pain any worse. (Carefully think about that.)  Call if:  You experience numbness  and weakness that gets worse with time, as opposed to wearing off.  New onset bowel or bladder incontinence. (This applies to Spinal procedures only)  Emergency Numbers:  [facility] business hours (Monday - Thursday, 8:00 AM - 4:00 PM) (Friday, 9:00 AM - 12:00 Noon): [phone]  After hours: [phone] ____________________________________________________________________________________________

## 2018-07-04 ENCOUNTER — Telehealth: Payer: Self-pay

## 2018-07-04 ENCOUNTER — Encounter: Payer: Self-pay | Admitting: Physical Therapy

## 2018-07-04 ENCOUNTER — Ambulatory Visit: Payer: Medicare Other | Admitting: Physical Therapy

## 2018-07-04 DIAGNOSIS — R269 Unspecified abnormalities of gait and mobility: Secondary | ICD-10-CM

## 2018-07-04 DIAGNOSIS — M6281 Muscle weakness (generalized): Secondary | ICD-10-CM

## 2018-07-04 DIAGNOSIS — R293 Abnormal posture: Secondary | ICD-10-CM

## 2018-07-04 NOTE — Telephone Encounter (Signed)
Post procedure phone call. Patient states she is doing good.  

## 2018-07-04 NOTE — Therapy (Signed)
[facility] [address] [place], [place], [address] Phone: [phone]   Fax:  [phone]  Physical Therapy Treatment  Patient Details  Name: Mike Holloway MRN: [id number] Date of Birth: May 24, 1941 Referring Provider (PT): Dr. Manson Passey   Encounter Date: 07/04/2018  PT End of Session - 07/04/18 1500    Visit Number  18    Number of Visits  24    Date for PT Re-Evaluation  07/24/18    PT Start Time  1432    PT Stop Time  1519    PT Time Calculation (min)  47 min    Equipment Utilized During Treatment  Gait belt    Activity Tolerance  Patient tolerated treatment well    Behavior During Therapy  [facility] for tasks assessed/performed       Past Medical History:  Diagnosis Date  . Arthritis   . Atrial flutter ([place])   . Diabetes mellitus ([place])   . Essential tremor   . Hypercholesteremia   . Hypertension   . Incontinence   . 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 Sleep apnea   . Stroke [facility])     Past Surgical History:  Procedure Laterality Date  . ABLATION    . APPENDECTOMY    . CATARACT EXTRACTION, BILATERAL    . COLONOSCOPY WITH PROPOFOL N/A 03/17/2018   Procedure: COLONOSCOPY WITH PROPOFOL;  Surgeon: Jonathon Bellows, MD;  Location: [facility] ENDOSCOPY;  Service: Gastroenterology;  Laterality: N/A;  . DEEP BRAIN STIMULATOR PLACEMENT    . 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 HEMORRHOID SURGERY    . HERNIA REPAIR    . NASAL SINUS SURGERY    . ORCHIECTOMY    . TONSILLECTOMY      There were no vitals filed for this visit.  Subjective Assessment - 07/04/18 1457    Subjective  Pt reports that he is in no pain today 2/2 to a genticulate nerve block performed. Pt and his wife report that today has been a relatively good day and they are hopeful that the nerve block results in a more  prolonged decreased pain time frame.    Patient is accompained by:  Family member    Limitations  Lifting;Standing;Walking;House hold activities    Patient Stated Goals  Improve LE strength/ gait and balance with daily tasks.  Return to using cane.      Currently in Pain?  No/denies    Pain Score  0-No pain    Pain Onset  More than a month ago       TREATMENT  Neuromuscular Re-education // bars Resisted walking in // bars with two BlackTB (Front/Back/BiLateral) x5 each, CGA/MIN A for LOB prevention  Pt. experienced 2 episodes of LOB during lateral resisted walking to the Right.  Pt. was prevented from falling by therapist. Airex: Static Stance, EO, EC; Tandem Stance EO, EC Standing Cone Taps (pt. continues to have difficulty with SLS on L LE)   Unbilled Interventions MHP R knee (10 minutes)    PT Education - 07/04/18 1459    Education Details  large movements, balance strategies    Person(s) Educated  Patient;Spouse    Methods  Explanation;Demonstration    Comprehension  Verbalized understanding;Returned demonstration          PT Long Term Goals - 06/26/18 1734  PT LONG TERM GOAL #1   Title  Pt. independent with HEP to increase B hip flexion/ abduction strength 1/2 muscle grade to improve pain-free mobility.      Baseline  B quad/ hamtring strength grossly 5/5 MMT and B hip flexion/ abd. 4/5 MMT on R and 3+/5 MMT on L.; B quad/ hamtring strength grossly 5/5 MMT and B hip flexion/ abd. 4/5 MMT on R and 5/5 MMT on L    Time  4    Period  Weeks    Status  Achieved    Target Date  05/24/18      PT LONG TERM GOAL #2   Title  Pt. will increase Berg balance test to >45 out of 56 to improve independence with gait/ decrease fall risk.      Baseline  Berg: 39/56; 8/28 Berg: 43/56; 9/25 Berg: 44/56    Time  4    Period  Weeks    Status  Partially Met    Target Date  07/24/18      PT LONG TERM GOAL #3   Title  Pt. able to ambulate 100 feet with consistent 2 point gait  pattern and use of SPC to improve household mobility.     Baseline  Pt. currently using RW; 8/28 consistent with 2 point gait patter with use of lofstrand crutch.    Time  4    Period  Weeks    Status  Achieved    Target Date  05/24/18      PT LONG TERM GOAL #4   Title  Pt. able to stand from normal chair with no UE assist to improve safety/independence with transfers.     Baseline  extra time and UE assist required to stand; 8/28 needs multiple attempts to rise form chair with min use of UE on legs.; 9/24 can ascend from chair with use of one UE    Time  4    Period  Weeks    Status  Partially Met    Target Date  07/24/18      PT LONG TERM GOAL #5   Title  Pt. able to ambulate on even surface for 100 ft without AD requiring only SBA.    Baseline  Pt. requires CGA with noted LOB when ambulating without AD.    Time  4    Period  Weeks    Status  New    Target Date  07/24/18        Plan - 07/04/18 1501    Clinical Impression Statement Patient presents to clinic pain free 2/2 genicular nerve block and was amenable therapy. Patient demonstrates improving dynamic balance but continues to express fear of falling and require verbal cues for stride length. Patient had multiple LOBs during resisted walking corrected with CGA/MIN A and UE support. Patient will continue to benefit from skilled therapeutic intervention to address balance recovery strategies during movement, decrease risk of falls, and improve overall QOL.   Rehab Potential  Fair    PT Frequency  2x / week    PT Duration  4 weeks    PT Treatment/Interventions  ADLs/Self Care Home Management;Therapeutic activities;Functional mobility training;Stair training;Gait training;Therapeutic exercise;Balance training;Neuromuscular re-education;Patient/family education;Manual techniques;Passive range of motion    PT Next Visit Plan  Balance/large movement progressions    PT Home Exercise Plan  See handouts       Patient will benefit  from skilled therapeutic intervention in order to improve the following deficits and impairments:  Abnormal gait, Decreased balance, Decreased endurance, Decreased mobility, Difficulty walking, Decreased range of motion, Decreased activity tolerance, Decreased strength, Impaired flexibility, Postural dysfunction, Pain  Visit Diagnosis: Muscle weakness (generalized)  Gait difficulty  Abnormal posture     Problem List Patient Active Problem List   Diagnosis Date Noted  . 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 HLD (hyperlipidemia) 03/24/2018  . Contusion of right knee 02/14/2018  . Bradycardia 12/28/2017  . 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 Chronic pain of right knee 08/10/2016  . Right ankle pain 08/10/2016  . Venous insufficiency of both lower extremities 05/13/2016  . 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[place]) 12/11/2015  . 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 Lymphoma, 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[place]) 04/03/2015  . Breathlessness on exertion 11/21/2014  . Breath shortness 11/21/2014  . Arthropathy 11/07/2014  . Atrial flutter, paroxysmal ([place]) 11/07/2014  . Type 2 diabetes mellitus ([place]) 11/07/2014  . Benign essential tremor 11/07/2014  . Benign essential HTN 11/07/2014  . Mixed incontinence 11/07/2014  . Hypercholesterolemia without hypertriglyceridemia 11/07/2014  . Apnea, sleep 11/07/2014  . Controlled type 2 diabetes mellitus without complication ([place]) 02/58/5277  . Pure hypercholesterolemia 11/07/2014  . Other abnormalities of gait and mobility 11/02/2011  . Abnormal gait 06/29/2011  . Discoordination 06/29/2011    Gwenlyn Saran, SPT This entire session was performed under direct supervision and direction of a licensed therapist/therapist assistant . I have personally read, edited and approve of the note as written.  Myles Gip PT, DPT [phone] 07/04/2018, 3:05 PM  [place] [facility] [address] [place], [place], [address] Phone: [phone]   Fax:  [phone]  Name: Mike Holloway MRN: [id number] Date of Birth: 11-21-1940

## 2018-07-06 ENCOUNTER — Encounter: Payer: Self-pay | Admitting: Physical Therapy

## 2018-07-06 ENCOUNTER — Ambulatory Visit: Payer: Medicare Other | Admitting: Physical Therapy

## 2018-07-06 DIAGNOSIS — R269 Unspecified abnormalities of gait and mobility: Secondary | ICD-10-CM

## 2018-07-06 DIAGNOSIS — M6281 Muscle weakness (generalized): Secondary | ICD-10-CM | POA: Diagnosis not present

## 2018-07-06 DIAGNOSIS — R293 Abnormal posture: Secondary | ICD-10-CM

## 2018-07-06 NOTE — Therapy (Signed)
[facility] [address] [place], [place], [address] Phone: [phone]   Fax:  [phone]  Physical Therapy Treatment  Patient Details  Name: Mike Holloway MRN: [id number] Date of Birth: 08/31/1941 Referring Provider (PT): Dr. Manson Passey   Encounter Date: 07/06/2018  PT End of Session - 07/06/18 1449    Visit Number  19    Number of Visits  24    Date for PT Re-Evaluation  07/24/18    PT Start Time  1433    PT Stop Time  1542    PT Time Calculation (min)  69 min    Equipment Utilized During Treatment  Gait belt    Activity Tolerance  Patient tolerated treatment well    Behavior During Therapy  [facility] for tasks assessed/performed       Past Medical History:  Diagnosis Date  . Arthritis   . Atrial flutter ([place])   . Diabetes mellitus ([place])   . Essential tremor   . Hypercholesteremia   . Hypertension   . Incontinence   . 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 Sleep apnea   . Stroke [facility])     Past Surgical History:  Procedure Laterality Date  . ABLATION    . APPENDECTOMY    . CATARACT EXTRACTION, BILATERAL    . COLONOSCOPY WITH PROPOFOL N/A 03/17/2018   Procedure: COLONOSCOPY WITH PROPOFOL;  Surgeon: Jonathon Bellows, MD;  Location: [facility] ENDOSCOPY;  Service: Gastroenterology;  Laterality: N/A;  . DEEP BRAIN STIMULATOR PLACEMENT    . 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 HEMORRHOID SURGERY    . HERNIA REPAIR    . NASAL SINUS SURGERY    . ORCHIECTOMY    . TONSILLECTOMY      There were no vitals filed for this visit.  Subjective Assessment - 07/06/18 1437    Subjective  Pt. reports no increase in pain after last treatment and was not sore after treatment.  Pt. also notes that after speaking with the eye MD, he is going to stop taking one of his allergy medications to see if  that assists in his eyesight.  Pt. notes he is doing much better today with his eyes feeling much better.    Patient is accompained by:  Family member    Limitations  Lifting;Standing;Walking;House hold activities    Patient Stated Goals  Improve LE strength/ gait and balance with daily tasks.  Return to using cane.      Currently in Pain?  No/denies    Pain Score  0-No pain    Pain Onset  More than a month ago       TREATMENT   Neuromuscular Re-education  Ambulate outside on level, incline, and uneven surfaces for distances <200 ft. CGA, no AD. 1 LOB on uneven surface. Standing unsupported, no AD, on level surface, SBA ~4 min. No LOB. No increased knee pain.  // bars Step-over 4" --4 cone taps -- step over 4" x4 laps Standing on level surface w/ perturbations 5 min., CGA, no UE support 6" toe taps B, CGA, no UE support, VCs for big & slow movements  -R 2x10  -L 2x10  -alternating x20 Patient educated on balance strategies (weight shift to recover) and appropriate compensations for balance recovery.   Unbilled Interventions MHP R knee (10 minutes)  PT Long Term Goals - 06/26/18 1734      PT LONG TERM GOAL #1   Title  Pt. independent with HEP to increase B hip flexion/ abduction strength 1/2 muscle grade to improve pain-free mobility.      Baseline  B quad/ hamtring strength grossly 5/5 MMT and B hip flexion/ abd. 4/5 MMT on R and 3+/5 MMT on L.; B quad/ hamtring strength grossly 5/5 MMT and B hip flexion/ abd. 4/5 MMT on R and 5/5 MMT on L    Time  4    Period  Weeks    Status  Achieved    Target Date  05/24/18      PT LONG TERM GOAL #2   Title  Pt. will increase Berg balance test to >45 out of 56 to improve independence with gait/ decrease fall risk.      Baseline  Berg: 39/56; 8/28 Berg: 43/56; 9/25 Berg: 44/56    Time  4    Period  Weeks    Status  Partially Met    Target Date  07/24/18      PT LONG TERM GOAL #3   Title  Pt. able to ambulate 100 feet with  consistent 2 point gait pattern and use of SPC to improve household mobility.     Baseline  Pt. currently using RW; 8/28 consistent with 2 point gait patter with use of lofstrand crutch.    Time  4    Period  Weeks    Status  Achieved    Target Date  05/24/18      PT LONG TERM GOAL #4   Title  Pt. able to stand from normal chair with no UE assist to improve safety/independence with transfers.     Baseline  extra time and UE assist required to stand; 8/28 needs multiple attempts to rise form chair with min use of UE on legs.; 9/24 can ascend from chair with use of one UE    Time  4    Period  Weeks    Status  Partially Met    Target Date  07/24/18      PT LONG TERM GOAL #5   Title  Pt. able to ambulate on even surface for 100 ft without AD requiring only SBA.    Baseline  Pt. requires CGA with noted LOB when ambulating without AD.    Time  4    Period  Weeks    Status  New    Target Date  07/24/18            Plan - 07/06/18 1625    Clinical Impression Statement  Patient presents to clinic with decreased pain and vision limitations and increased energy. He continues to use an arm counterbalance strategy to prevent LOB, but upon receiving verbal cues to use a weight shift strategy initiating from his center of mass, the patient demonstrated improved dynamic balance against perturbations and during single leg stance activities. Patient ambulated across multiple surfaces (level, incline, and uneven) with CGA, no AD and only had one LOB demonstrating improving balance strategies, B LE strength, and mobility. Patient will continue to benefit from skilled therapeutic intervention to address his deficits in balance, strength, pain, and mobility in order to improve overall QOL and decrease risk of falls.   Rehab Potential  Fair    PT Frequency  2x / week    PT Duration  4 weeks    PT Treatment/Interventions  ADLs/Self Care Home Management;Therapeutic  activities;Functional mobility  training;Stair training;Gait training;Therapeutic exercise;Balance training;Neuromuscular re-education;Patient/family education;Manual techniques;Passive range of motion    PT Next Visit Plan  Balance/large movement progressions    PT Home Exercise Plan  See handouts       Patient will benefit from skilled therapeutic intervention in order to improve the following deficits and impairments:  Abnormal gait, Decreased balance, Decreased endurance, Decreased mobility, Difficulty walking, Decreased range of motion, Decreased activity tolerance, Decreased strength, Impaired flexibility, Postural dysfunction, Pain  Visit Diagnosis: Muscle weakness (generalized)  Gait difficulty  Abnormal posture     Problem List Patient Active Problem List   Diagnosis Date Noted  . 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 HLD (hyperlipidemia) 03/24/2018  . Contusion of right knee 02/14/2018  . Bradycardia 12/28/2017  . 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 Chronic pain of right knee 08/10/2016  . Right ankle pain 08/10/2016  . Venous insufficiency of both lower extremities 05/13/2016  . 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[place]) 12/11/2015  . 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 Lymphoma, 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[place]) 04/03/2015  . Breathlessness on exertion 11/21/2014  . Breath shortness 11/21/2014  . Arthropathy 11/07/2014  . Atrial flutter, paroxysmal ([place]) 11/07/2014  . Type 2 diabetes mellitus ([place]) 11/07/2014  . Benign essential tremor 11/07/2014  . Benign essential HTN 11/07/2014  . Mixed incontinence 11/07/2014  . Hypercholesterolemia without hypertriglyceridemia 11/07/2014  . Apnea, sleep 11/07/2014  . Controlled type 2 diabetes mellitus without complication ([place]) 61/24/3275  . Pure hypercholesterolemia 11/07/2014  . Other abnormalities of gait and mobility 11/02/2011  . Abnormal gait 06/29/2011  . Discoordination 06/29/2011   Gwenlyn Saran, SPT This entire session was  performed under direct supervision and direction of a licensed therapist/therapist assistant . I have personally read, edited and approve of the note as written.  Myles Gip PT, DPT [phone] 07/06/2018, 4:28 PM  [place] [facility] [address] [place], [place], [address] Phone: [phone]   Fax:  [phone]  Name: Alisha Burgo MRN: [id number] Date of Birth: 01-13-1941

## 2018-07-11 ENCOUNTER — Ambulatory Visit: Payer: Medicare Other | Admitting: Physical Therapy

## 2018-07-11 DIAGNOSIS — M6281 Muscle weakness (generalized): Secondary | ICD-10-CM

## 2018-07-11 DIAGNOSIS — R269 Unspecified abnormalities of gait and mobility: Secondary | ICD-10-CM

## 2018-07-11 DIAGNOSIS — R293 Abnormal posture: Secondary | ICD-10-CM

## 2018-07-11 NOTE — Therapy (Signed)
[facility] [address] [place], [place], [address] Phone: [phone]   Fax:  [phone]  Physical Therapy Treatment  Patient Details  Name: Mike Holloway MRN: [id number] Date of Birth: May 27, 1941 Referring Provider (PT): Dr. Manson Passey   Encounter Date: 07/11/2018  PT End of Session - 07/11/18 1609    Visit Number  20    Number of Visits  24    Date for PT Re-Evaluation  07/24/18    PT Start Time  2035    PT Stop Time  1524    PT Time Calculation (min)  56 min    Equipment Utilized During Treatment  Gait belt    Activity Tolerance  Patient tolerated treatment well    Behavior During Therapy  [facility] for tasks assessed/performed       Past Medical History:  Diagnosis Date  . Arthritis   . Atrial flutter ([place])   . Diabetes mellitus ([place])   . Essential tremor   . Hypercholesteremia   . Hypertension   . Incontinence   . 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 Sleep apnea   . Stroke [facility])     Past Surgical History:  Procedure Laterality Date  . ABLATION    . APPENDECTOMY    . CATARACT EXTRACTION, BILATERAL    . COLONOSCOPY WITH PROPOFOL N/A 03/17/2018   Procedure: COLONOSCOPY WITH PROPOFOL;  Surgeon: Jonathon Bellows, MD;  Location: [facility] ENDOSCOPY;  Service: Gastroenterology;  Laterality: N/A;  . DEEP BRAIN STIMULATOR PLACEMENT    . 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 HEMORRHOID SURGERY    . HERNIA REPAIR    . NASAL SINUS SURGERY    . ORCHIECTOMY    . TONSILLECTOMY      There were no vitals filed for this visit.  Subjective Assessment - 07/11/18 1608    Subjective  Pt. reports no new complaints today upon arrival and notices that his leg is slightly in pain, 1/10.      Patient is accompained by:  Family member    Limitations  Lifting;Standing;Walking;House hold  activities    Patient Stated Goals  Improve LE strength/ gait and balance with daily tasks.  Return to using cane.      Currently in Pain?  Yes    Pain Score  1     Pain Location  Knee    Pain Orientation  Right    Pain Descriptors / Indicators  Aching;Constant;Discomfort    Pain Onset  More than a month ago           TREATMENT   Neuromuscular Re-education  Ambulate outside on level, incline, and uneven surfaces for distances <200 ft. CGA, no AD.  Increased knee pain to 2/10 Standing unsupported, no AD, on level surface, SBA 4 min. No LOB. No increased knee pain. Hallway Obstacle Course (6" step ups, 3" step-ups, cone toe taps, Airex pad step ups/marches) x10   There Ex:  B Standing Hip Extension with 5# cuff weight x15 B Standing Hip Lateral Kicks with 5# cuff weight x15 B Standing Marches with 5# cuff weight x15 B Standing Hamstring Curls with 5# cuff weight x15    Unbilled Interventions MHP R knee (10 minutes)        PT Long Term Goals - 06/26/18 1734      PT LONG  TERM GOAL #1   Title  Pt. independent with HEP to increase B hip flexion/ abduction strength 1/2 muscle grade to improve pain-free mobility.      Baseline  B quad/ hamtring strength grossly 5/5 MMT and B hip flexion/ abd. 4/5 MMT on R and 3+/5 MMT on L.; B quad/ hamtring strength grossly 5/5 MMT and B hip flexion/ abd. 4/5 MMT on R and 5/5 MMT on L    Time  4    Period  Weeks    Status  Achieved    Target Date  05/24/18      PT LONG TERM GOAL #2   Title  Pt. will increase Berg balance test to >45 out of 56 to improve independence with gait/ decrease fall risk.      Baseline  Berg: 39/56; 8/28 Berg: 43/56; 9/25 Berg: 44/56    Time  4    Period  Weeks    Status  Partially Met    Target Date  07/24/18      PT LONG TERM GOAL #3   Title  Pt. able to ambulate 100 feet with consistent 2 point gait pattern and use of SPC to improve household mobility.     Baseline  Pt. currently using RW; 8/28  consistent with 2 point gait patter with use of lofstrand crutch.    Time  4    Period  Weeks    Status  Achieved    Target Date  05/24/18      PT LONG TERM GOAL #4   Title  Pt. able to stand from normal chair with no UE assist to improve safety/independence with transfers.     Baseline  extra time and UE assist required to stand; 8/28 needs multiple attempts to rise form chair with min use of UE on legs.; 9/24 can ascend from chair with use of one UE    Time  4    Period  Weeks    Status  Partially Met    Target Date  07/24/18      PT LONG TERM GOAL #5   Title  Pt. able to ambulate on even surface for 100 ft without AD requiring only SBA.    Baseline  Pt. requires CGA with noted LOB when ambulating without AD.    Time  4    Period  Weeks    Status  New    Target Date  07/24/18            Plan - 07/12/18 0921    Clinical Impression Statement  Pt. continues to perform well with exercises and has made considerable improvements with balance on obstacle course.  Pt. is able to perform obstacle course with use of lofstrand crutch without any LOB.  Pt. noted to feel more secure with lofstrand crutch and experienced less righting reactions with UE's.  also able to walk acorss outside surfaces without any LOB noted.  Pt. to continue with balance activities to increase stbaility during ambulation.    Clinical Presentation  Evolving    Clinical Decision Making  Moderate    Rehab Potential  Fair    PT Frequency  2x / week    PT Duration  4 weeks    PT Treatment/Interventions  ADLs/Self Care Home Management;Therapeutic activities;Functional mobility training;Stair training;Gait training;Therapeutic exercise;Balance training;Neuromuscular re-education;Patient/family education;Manual techniques;Passive range of motion    PT Next Visit Plan  Balance/large movement progressions    PT Home Exercise Plan  See handouts         Patient will benefit from skilled therapeutic intervention in order  to improve the following deficits and impairments:  Abnormal gait, Decreased balance, Decreased endurance, Decreased mobility, Difficulty walking, Decreased range of motion, Decreased activity tolerance, Decreased strength, Impaired flexibility, Postural dysfunction, Pain  Visit Diagnosis: Muscle weakness (generalized)  Gait difficulty  Abnormal posture     Problem List Patient Active Problem List   Diagnosis Date Noted  . 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 HLD (hyperlipidemia) 03/24/2018  . Contusion of right knee 02/14/2018  . Bradycardia 12/28/2017  . 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 Chronic pain of right knee 08/10/2016  . Right ankle pain 08/10/2016  . Venous insufficiency of both lower extremities 05/13/2016  . 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HCC) 12/11/2015  . 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 Lymphoma, 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HCC) 04/03/2015  . Breathlessness on exertion 11/21/2014  . Breath shortness 11/21/2014  . Arthropathy 11/07/2014  . Atrial flutter, paroxysmal (HCC) 11/07/2014  . Type 2 diabetes mellitus (HCC) 11/07/2014  . Benign essential tremor 11/07/2014  . Benign essential HTN 11/07/2014  . Mixed incontinence 11/07/2014  . Hypercholesterolemia without hypertriglyceridemia 11/07/2014  . Apnea, sleep 11/07/2014  . Controlled type 2 diabetes mellitus without complication (HCC) 11/07/2014  . Pure hypercholesterolemia 11/07/2014  . Other abnormalities of gait and mobility 11/02/2011  . Abnormal gait 06/29/2011  . Discoordination 06/29/2011    Joshua Robbins, SPT This entire session was performed under direct supervision and direction of a licensed therapist/therapist assistant . I have personally read, edited and approve of the note as written.  Katlin Harker PT, DPT #[id number] 07/12/2018, 9:53 AM  [facility] [address] [place], [place], [address] Phone: [phone]    Fax:  [phone]  Name: Gardy Donald Hershkowitz MRN: [id number] Date of Birth: 04/26/1941   

## 2018-07-12 ENCOUNTER — Encounter: Payer: Self-pay | Admitting: Physical Therapy

## 2018-07-13 ENCOUNTER — Encounter: Payer: Self-pay | Admitting: Physical Therapy

## 2018-07-13 ENCOUNTER — Ambulatory Visit: Payer: Medicare Other | Admitting: Physical Therapy

## 2018-07-13 DIAGNOSIS — R269 Unspecified abnormalities of gait and mobility: Secondary | ICD-10-CM

## 2018-07-13 DIAGNOSIS — R293 Abnormal posture: Secondary | ICD-10-CM

## 2018-07-13 DIAGNOSIS — M6281 Muscle weakness (generalized): Secondary | ICD-10-CM | POA: Diagnosis not present

## 2018-07-13 NOTE — Therapy (Signed)
[facility] [address] [place], [place], [address] Phone: [phone]   Fax:  [phone]  Physical Therapy Treatment  Patient Details  Name: Mike Holloway MRN: [id number] Date of Birth: 1941/06/28 Referring Provider (PT): Dr. Manson Passey   Encounter Date: 07/13/2018  PT End of Session - 07/13/18 1622    Visit Number  21    Number of Visits  24    Date for PT Re-Evaluation  07/24/18    PT Start Time  9937    PT Stop Time  1536    PT Time Calculation (min)  62 min    Equipment Utilized During Treatment  Gait belt    Activity Tolerance  Patient tolerated treatment well    Behavior During Therapy  [facility] for tasks assessed/performed       Past Medical History:  Diagnosis Date  . Arthritis   . Atrial flutter ([place])   . Diabetes mellitus ([place])   . Essential tremor   . Hypercholesteremia   . Hypertension   . Incontinence   . 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 Sleep apnea   . Stroke [facility])     Past Surgical History:  Procedure Laterality Date  . ABLATION    . APPENDECTOMY    . CATARACT EXTRACTION, BILATERAL    . COLONOSCOPY WITH PROPOFOL N/A 03/17/2018   Procedure: COLONOSCOPY WITH PROPOFOL;  Surgeon: Jonathon Bellows, MD;  Location: [facility] ENDOSCOPY;  Service: Gastroenterology;  Laterality: N/A;  . DEEP BRAIN STIMULATOR PLACEMENT    . 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 HEMORRHOID SURGERY    . HERNIA REPAIR    . NASAL SINUS SURGERY    . ORCHIECTOMY    . TONSILLECTOMY      There were no vitals filed for this visit.  Subjective Assessment - 07/13/18 1444    Subjective  Pt is doing "fine" today, but does note that his eyes are bothering him. He is in good spirits.  (Pended)     Patient is accompained by:  Family member    Limitations  Lifting;Standing;Walking;House hold  activities    Patient Stated Goals  Improve LE strength/ gait and balance with daily tasks.  Return to using cane.      Currently in Pain?  Yes  (Pended)     Pain Score  1   (Pended)     Pain Location  Knee  (Pended)     Pain Orientation  Right  (Pended)     Pain Descriptors / Indicators  Aching  (Pended)     Pain Type  Chronic pain  (Pended)     Pain Onset  More than a month ago       Neuromuscular Re-education Large amplitude movements in standing (// bars), CGA, BUE/BLE x60 total reps of patterns Hallway Obstacle Course (6" step ups, 3" step-ups, cone toe taps, Airex pad step ups/marches) x4 no AD, (MinA), x2 w/AD (CGA), 1 LOB on compliant surface  Patient requires rest breaks during balance activities 2/2 to fatigue and R knee pain.   Therapeutic Ex:  B Standing Hip Extension with 7.5# cuff weight x15 B Standing Hip Lateral Kicks with 7.5# cuff weight x15 B Standing Marches with 7.5# cuff weight x15 B Standing Hamstring Curls with 7.5# cuff weight x15    Unbilled Interventions MHP R knee (  10 minutes)     PT Education - 07/13/18 1620    Education Details  large movements, balance strategies, exercise technique    Person(s) Educated  Patient;Spouse    Methods  Explanation;Demonstration    Comprehension  Verbalized understanding;Returned demonstration          PT Long Term Goals - 06/26/18 1734      PT LONG TERM GOAL #1   Title  Pt. independent with HEP to increase B hip flexion/ abduction strength 1/2 muscle grade to improve pain-free mobility.      Baseline  B quad/ hamtring strength grossly 5/5 MMT and B hip flexion/ abd. 4/5 MMT on R and 3+/5 MMT on L.; B quad/ hamtring strength grossly 5/5 MMT and B hip flexion/ abd. 4/5 MMT on R and 5/5 MMT on L    Time  4    Period  Weeks    Status  Achieved    Target Date  05/24/18      PT LONG TERM GOAL #2   Title  Pt. will increase Berg balance test to >45 out of 56 to improve independence with gait/ decrease  fall risk.      Baseline  Berg: 39/56; 8/28 Berg: 43/56; 9/25 Berg: 44/56    Time  4    Period  Weeks    Status  Partially Met    Target Date  07/24/18      PT LONG TERM GOAL #3   Title  Pt. able to ambulate 100 feet with consistent 2 point gait pattern and use of SPC to improve household mobility.     Baseline  Pt. currently using RW; 8/28 consistent with 2 point gait patter with use of lofstrand crutch.    Time  4    Period  Weeks    Status  Achieved    Target Date  05/24/18      PT LONG TERM GOAL #4   Title  Pt. able to stand from normal chair with no UE assist to improve safety/independence with transfers.     Baseline  extra time and UE assist required to stand; 8/28 needs multiple attempts to rise form chair with min use of UE on legs.; 9/24 can ascend from chair with use of one UE    Time  4    Period  Weeks    Status  Partially Met    Target Date  07/24/18      PT LONG TERM GOAL #5   Title  Pt. able to ambulate on even surface for 100 ft without AD requiring only SBA.    Baseline  Pt. requires CGA with noted LOB when ambulating without AD.    Time  4    Period  Weeks    Status  New    Target Date  07/24/18            Plan - 07/13/18 1624    Clinical Impression Statement  Patient presents to clinic with no increased pain and no recent falls and was amenable to therapy. 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 Additionally, the patient demonstrates improved fluidity of movement during large exaggerated movements. Patient is no able to articulate and demonstrate a weight shift as a balance righting technique during dynamic activity. Patient will continue to  benefit from skilled therapeutic intervention to address remaining balance deficits and  decrease risk of falls.    Rehab Potential  Fair    PT Frequency  2x / week    PT Duration  4 weeks    PT Treatment/Interventions  ADLs/Self Care Home Management;Therapeutic activities;Functional mobility training;Stair training;Gait training;Therapeutic exercise;Balance training;Neuromuscular re-education;Patient/family education;Manual techniques;Passive range of motion    PT Next Visit Plan  Balance/large movement progressions    PT Home Exercise Plan  See handouts       Patient will benefit from skilled therapeutic intervention in order to improve the following deficits and impairments:  Abnormal gait, Decreased balance, Decreased endurance, Decreased mobility, Difficulty walking, Decreased range of motion, Decreased activity tolerance, Decreased strength, Impaired flexibility, Postural dysfunction, Pain  Visit Diagnosis: Muscle weakness (generalized)  Gait difficulty  Abnormal posture   Problem List Patient Active Problem List   Diagnosis Date Noted  . 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 HLD (hyperlipidemia) 03/24/2018  . Contusion of right knee 02/14/2018  . Bradycardia 12/28/2017  . 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 Chronic pain of right knee 08/10/2016  . Right ankle pain 08/10/2016  . Venous insufficiency of both lower extremities 05/13/2016  . 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[place]) 12/11/2015  . 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 Lymphoma, 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[place]) 04/03/2015  . Breathlessness on exertion 11/21/2014  . Breath shortness 11/21/2014  . Arthropathy 11/07/2014  . Atrial flutter, paroxysmal ([place]) 11/07/2014  . Type 2 diabetes mellitus ([place]) 11/07/2014  . Benign essential tremor 11/07/2014  . Benign essential HTN 11/07/2014  . Mixed incontinence 11/07/2014  . Hypercholesterolemia without hypertriglyceridemia 11/07/2014  . Apnea, sleep 11/07/2014  . Controlled type 2 diabetes mellitus without complication ([place]) 29/10/1113   . Pure hypercholesterolemia 11/07/2014  . Other abnormalities of gait and mobility 11/02/2011  . Abnormal gait 06/29/2011  . Discoordination 06/29/2011    Gwenlyn Saran, SPT  This entire session was performed under direct supervision and direction of a licensed therapist/therapist assistant . I have personally read, edited and approve of the note as written.  Myles Gip PT, DPT [phone] 07/13/2018, 4:28 PM  [facility] [address] [place], [place], [address] Phone: [phone]   Fax:  [phone]  Name: Marshel Golubski MRN: [id number] Date of Birth: 1941-04-25

## 2018-07-17 ENCOUNTER — Ambulatory Visit: Payer: Medicare Other | Admitting: Physical Therapy

## 2018-07-19 ENCOUNTER — Ambulatory Visit: Payer: Medicare Other | Admitting: Physical Therapy

## 2018-07-19 ENCOUNTER — Encounter: Payer: Self-pay | Admitting: Physical Therapy

## 2018-07-19 DIAGNOSIS — M6281 Muscle weakness (generalized): Secondary | ICD-10-CM | POA: Diagnosis not present

## 2018-07-19 DIAGNOSIS — R269 Unspecified abnormalities of gait and mobility: Secondary | ICD-10-CM

## 2018-07-19 DIAGNOSIS — R293 Abnormal posture: Secondary | ICD-10-CM

## 2018-07-20 ENCOUNTER — Encounter: Payer: Self-pay | Admitting: Physical Therapy

## 2018-07-20 ENCOUNTER — Ambulatory Visit: Payer: Medicare Other | Admitting: Physical Therapy

## 2018-07-20 DIAGNOSIS — R293 Abnormal posture: Secondary | ICD-10-CM

## 2018-07-20 DIAGNOSIS — M6281 Muscle weakness (generalized): Secondary | ICD-10-CM

## 2018-07-20 DIAGNOSIS — R269 Unspecified abnormalities of gait and mobility: Secondary | ICD-10-CM

## 2018-07-20 NOTE — Therapy (Signed)
[facility] [address] [place], [place], [address] Phone: [phone]   Fax:  [phone]  Physical Therapy Treatment  Patient Details  Name: Mike Holloway MRN: [id number] Date of Birth: 1940-10-26 Referring Provider (PT): Dr. Manson Passey   Encounter Date: 07/19/2018  PT End of Session - 07/20/18 0754    Visit Number  22    Number of Visits  24    Date for PT Re-Evaluation  07/24/18    PT Start Time  0354    PT Stop Time  1538    PT Time Calculation (min)  66 min    Equipment Utilized During Treatment  Gait belt    Activity Tolerance  Patient tolerated treatment well    Behavior During Therapy  [facility] for tasks assessed/performed       Past Medical History:  Diagnosis Date  . Arthritis   . Atrial flutter ([place])   . Diabetes mellitus ([place])   . Essential tremor   . Hypercholesteremia   . Hypertension   . Incontinence   . 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[place])    grew on the testical  . Sleep apnea   . Stroke [facility])     Past Surgical History:  Procedure Laterality Date  . ABLATION    . APPENDECTOMY    . CATARACT EXTRACTION, BILATERAL    . COLONOSCOPY WITH PROPOFOL N/A 03/17/2018   Procedure: COLONOSCOPY WITH PROPOFOL;  Surgeon: Jonathon Bellows, MD;  Location: [facility] ENDOSCOPY;  Service: Gastroenterology;  Laterality: N/A;  . DEEP BRAIN STIMULATOR PLACEMENT    . 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 HEMORRHOID SURGERY    . HERNIA REPAIR    . NASAL SINUS SURGERY    . ORCHIECTOMY    . TONSILLECTOMY      There were no vitals filed for this visit.  Subjective Assessment - 07/19/18 1509    Subjective  Pt. reports no new complaints today and is excited about leaving for [place] and other surrounding states on Friday through the next 2 weeks.  Wife reports that she will be doing the driving and she is much  happier with his progress with walking around with cane.    Patient is accompained by:  Family member    Limitations  Lifting;Standing;Walking;House hold activities    Patient Stated Goals  Improve LE strength/ gait and balance with daily tasks.  Return to using cane.      Currently in Pain?  Yes    Pain Score  1     Pain Location  Knee    Pain Orientation  Right    Pain Onset  More than a month ago          TREATMENT   Neuromuscular Re-education  Star Excursion with Large Amplitude Movements to reinforce postural correction 1 LOB noted due to crossing of feet x multiple bouts Hallway Obstacle Course (6"step ups,3" step-ups,cone toe taps, Airex pad step ups/marches) x2 no AD, (MinA), x1 w/AD (CGA), 2 LOB noted with wall-touch correction, but no falls noted. Stair climbing with reciprocal gait pattern and min use of UE for support x10  Patient requires rest breaks during balance activities due to fatigue and increasing R knee pain.   Therapeutic Ex:  B Standing Hip Extension with 5# cuff weight x15 B Standing Hip Lateral Kicks with 5# cuff weight x15 B StandingHamstring Curlswith 5#  cuff weight x15 B Seated LAQ with 5# cuff weight x15 B Seated Marches with 5# cuff weight x15      PT Long Term Goals - 06/26/18 1734      PT LONG TERM GOAL #1   Title  Pt. independent with HEP to increase B hip flexion/ abduction strength 1/2 muscle grade to improve pain-free mobility.      Baseline  B quad/ hamtring strength grossly 5/5 MMT and B hip flexion/ abd. 4/5 MMT on R and 3+/5 MMT on L.; B quad/ hamtring strength grossly 5/5 MMT and B hip flexion/ abd. 4/5 MMT on R and 5/5 MMT on L    Time  4    Period  Weeks    Status  Achieved    Target Date  05/24/18      PT LONG TERM GOAL #2   Title  Pt. will increase Berg balance test to >45 out of 56 to improve independence with gait/ decrease fall risk.      Baseline  Berg: 39/56; 8/28 Berg: 43/56; 9/25 Berg: 44/56    Time  4     Period  Weeks    Status  Partially Met    Target Date  07/24/18      PT LONG TERM GOAL #3   Title  Pt. able to ambulate 100 feet with consistent 2 point gait pattern and use of SPC to improve household mobility.     Baseline  Pt. currently using RW; 8/28 consistent with 2 point gait patter with use of lofstrand crutch.    Time  4    Period  Weeks    Status  Achieved    Target Date  05/24/18      PT LONG TERM GOAL #4   Title  Pt. able to stand from normal chair with no UE assist to improve safety/independence with transfers.     Baseline  extra time and UE assist required to stand; 8/28 needs multiple attempts to rise form chair with min use of UE on legs.; 9/24 can ascend from chair with use of one UE    Time  4    Period  Weeks    Status  Partially Met    Target Date  07/24/18      PT LONG TERM GOAL #5   Title  Pt. able to ambulate on even surface for 100 ft without AD requiring only SBA.    Baseline  Pt. requires CGA with noted LOB when ambulating without AD.    Time  4    Period  Weeks    Status  New    Target Date  07/24/18            Plan - 07/20/18 0755    Clinical Impression Statement  Pt. comes to clinic with no increased pain in R knee with eyes open and no new complaints.  Pt. continues to perform well with increasingly difficult balance exercises, noting only one LOB episode with star excursion exercise.  Pt. continues to improve with balance and stability on more unstable surfaces.  Pt. and wife will be out of town for the next two weeks and will be seen once they return.    Clinical Presentation  Evolving    Clinical Decision Making  Moderate    Rehab Potential  Fair    PT Frequency  2x / week    PT Duration  4 weeks    PT Treatment/Interventions  ADLs/Self Care Home Management;Therapeutic  activities;Functional mobility training;Stair training;Gait training;Therapeutic exercise;Balance training;Neuromuscular re-education;Patient/family education;Manual  techniques;Passive range of motion    PT Next Visit Plan  Balance/large movement progressions    PT Home Exercise Plan  See handouts       Patient will benefit from skilled therapeutic intervention in order to improve the following deficits and impairments:  Abnormal gait, Decreased balance, Decreased endurance, Decreased mobility, Difficulty walking, Decreased range of motion, Decreased activity tolerance, Decreased strength, Impaired flexibility, Postural dysfunction, Pain  Visit Diagnosis: Muscle weakness (generalized)  Gait difficulty  Abnormal posture     Problem List Patient Active Problem List   Diagnosis Date Noted  . 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 HLD (hyperlipidemia) 03/24/2018  . Contusion of right knee 02/14/2018  . Bradycardia 12/28/2017  . 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 Chronic pain of right knee 08/10/2016  . Right ankle pain 08/10/2016  . Venous insufficiency of both lower extremities 05/13/2016  . 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[place]) 12/11/2015  . 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 Lymphoma, 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[place]) 04/03/2015  . Breathlessness on exertion 11/21/2014  . Breath shortness 11/21/2014  . Arthropathy 11/07/2014  . Atrial flutter, paroxysmal ([place]) 11/07/2014  . Type 2 diabetes mellitus ([place]) 11/07/2014  . Benign essential tremor 11/07/2014  . Benign essential HTN 11/07/2014  . Mixed incontinence 11/07/2014  . Hypercholesterolemia without hypertriglyceridemia 11/07/2014  . Apnea, sleep 11/07/2014  . Controlled type 2 diabetes mellitus without complication ([place]) 63/84/6659  . Pure hypercholesterolemia 11/07/2014  . Other abnormalities of gait and mobility 11/02/2011  . Abnormal gait 06/29/2011  . Discoordination 06/29/2011   Pura Spice, PT, DPT # [id number] Gwenlyn Saran, SPT 07/20/2018, 8:37 AM  [facility] [address] [place], [place],  [address] Phone: [phone]   Fax:  [phone]  Name: Mike Holloway MRN: [id number] Date of Birth: 1941/05/03

## 2018-07-20 NOTE — Therapy (Signed)
[place] [facility] [address] [place], [place], [address] Phone: [phone]   Fax:  [phone]  Physical Therapy Treatment  Patient Details  Name: Curties Conigliaro MRN: [id number] Date of Birth: 07/07/1941 Referring Provider (PT): Dr. Manson Passey   Encounter Date: 07/20/2018  PT End of Session - 07/20/18 1459    Visit Number  23    Number of Visits  24    Date for PT Re-Evaluation  07/24/18    PT Start Time  4827    PT Stop Time  1524    PT Time Calculation (min)  56 min    Equipment Utilized During Treatment  Gait belt    Activity Tolerance  Patient tolerated treatment well    Behavior During Therapy  [facility] for tasks assessed/performed       Past Medical History:  Diagnosis Date  . Arthritis   . Atrial flutter ([place])   . Diabetes mellitus ([place])   . Essential tremor   . Hypercholesteremia   . Hypertension   . Incontinence   . 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 Sleep apnea   . Stroke [facility])     Past Surgical History:  Procedure Laterality Date  . ABLATION    . APPENDECTOMY    . CATARACT EXTRACTION, BILATERAL    . COLONOSCOPY WITH PROPOFOL N/A 03/17/2018   Procedure: COLONOSCOPY WITH PROPOFOL;  Surgeon: Jonathon Bellows, MD;  Location: [facility] ENDOSCOPY;  Service: Gastroenterology;  Laterality: N/A;  . DEEP BRAIN STIMULATOR PLACEMENT    . 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 HEMORRHOID SURGERY    . HERNIA REPAIR    . NASAL SINUS SURGERY    . ORCHIECTOMY    . TONSILLECTOMY      There were no vitals filed for this visit.  Subjective Assessment - 07/20/18 1457    Subjective  Pt. reports no new complaints, however did take an ibuprofen last night for pain in R knee.  Wife states the pain reported was just a nagging/annoying pain, not extremely painful.  Pt. also notes that they  were picking up someone from the airport later lsat night which was bothersome as well.  Pt. is excited for trip to [place] over the next two weeks.    Patient is accompained by:  Family member    Limitations  Lifting;Standing;Walking;House hold activities    Patient Stated Goals  Improve LE strength/ gait and balance with daily tasks.  Return to using cane.      Currently in Pain?  No/denies    Pain Score  0-No pain    Pain Onset  More than a month ago            TREATMENT Neuromuscular Re-education  Star Excursion with Large Amplitude Movements to reinforce postural correction 1 LOB noted due to crossing of feet x multiple bouts Hallway Obstacle Course (6"step ups,3" step-ups,cone toe taps, Airex pad step ups/marches)x1 no AD, (MinA), x1 w/AD (CGA), 1  LOB noted with wall-touch correction, but no falls noted. Stair climbing with reciprocal gait pattern and min use of UE for support x10  Patient required less frequent rest breaks today and no increased pain.   TherapeuticEx:  B Standing Marches with 5# cuff weight x15  B Standing Hip Extension with 5# cuff weight x15 B Standing Hip Lateral Kicks with5#  cuff weight x15 B StandingHamstring Curlswith5# cuff weight x15 B Seated LAQ with 5# cuff weight x15 B Seated Marches with 5# cuff weight x15   All TherEx performed in // bars for safety.  MH applied at conclusion of therapy, 10 min, not billed    Pt. And family education provided about direction of POC and returning to clinic after out-of-state trip concludes.    PT Long Term Goals - 06/26/18 1734      PT LONG TERM GOAL #1   Title  Pt. independent with HEP to increase B hip flexion/ abduction strength 1/2 muscle grade to improve pain-free mobility.      Baseline  B quad/ hamtring strength grossly 5/5 MMT and B hip flexion/ abd. 4/5 MMT on R and 3+/5 MMT on L.; B quad/ hamtring strength grossly 5/5 MMT and B hip flexion/ abd. 4/5 MMT on R and 5/5 MMT on L     Time  4    Period  Weeks    Status  Achieved    Target Date  05/24/18      PT LONG TERM GOAL #2   Title  Pt. will increase Berg balance test to >45 out of 56 to improve independence with gait/ decrease fall risk.      Baseline  Berg: 39/56; 8/28 Berg: 43/56; 9/25 Berg: 44/56    Time  4    Period  Weeks    Status  Partially Met    Target Date  07/24/18      PT LONG TERM GOAL #3   Title  Pt. able to ambulate 100 feet with consistent 2 point gait pattern and use of SPC to improve household mobility.     Baseline  Pt. currently using RW; 8/28 consistent with 2 point gait patter with use of lofstrand crutch.    Time  4    Period  Weeks    Status  Achieved    Target Date  05/24/18      PT LONG TERM GOAL #4   Title  Pt. able to stand from normal chair with no UE assist to improve safety/independence with transfers.     Baseline  extra time and UE assist required to stand; 8/28 needs multiple attempts to rise form chair with min use of UE on legs.; 9/24 can ascend from chair with use of one UE    Time  4    Period  Weeks    Status  Partially Met    Target Date  07/24/18      PT LONG TERM GOAL #5   Title  Pt. able to ambulate on even surface for 100 ft without AD requiring only SBA.    Baseline  Pt. requires CGA with noted LOB when ambulating without AD.    Time  4    Period  Weeks    Status  New    Target Date  07/24/18            Plan - 07/20/18 1500    Clinical Impression Statement  Pt. comes to therapy with decreased vision from eye exam.  Pt. reports eyesight was much better prior to MD shining bright lights in eyes. Pt. notes no increase in pain today with interventions and upon leaving clinic.  Pt. continues to show great strides with use of lofstrand crutch and ability to keep balance/upright posture during exercises.  Pt. and wife were educated on future sessions after return from trip to [place].  Pt. and  wife discussed POC changes and possibly allowing for pt. to go  without therapy until eye resolution occurs and pt. is able to see much clearer.  Pt. to update clinic on progress after two weeks of being out of town.    Clinical Presentation  Evolving    Clinical Decision Making  Moderate    Rehab Potential  Fair    PT Frequency  2x / week    PT Duration  4 weeks    PT Treatment/Interventions  ADLs/Self Care Home Management;Therapeutic activities;Functional mobility training;Stair training;Gait training;Therapeutic exercise;Balance training;Neuromuscular re-education;Patient/family education;Manual techniques;Passive range of motion    PT Next Visit Plan  Recertification/Assess [place] trip.    PT Home Exercise Plan  See handouts       Patient will benefit from skilled therapeutic intervention in order to improve the following deficits and impairments:  Abnormal gait, Decreased balance, Decreased endurance, Decreased mobility, Difficulty walking, Decreased range of motion, Decreased activity tolerance, Decreased strength, Impaired flexibility, Postural dysfunction, Pain  Visit Diagnosis: Muscle weakness (generalized)  Gait difficulty  Abnormal posture     Problem List Patient Active Problem List   Diagnosis Date Noted  . 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 HLD (hyperlipidemia) 03/24/2018  . Contusion of right knee 02/14/2018  . Bradycardia 12/28/2017  . 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 Chronic pain of right knee 08/10/2016  . Right ankle pain 08/10/2016  . Venous insufficiency of both lower extremities 05/13/2016  . 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[place]) 12/11/2015  . 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 Lymphoma, 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[place]) 04/03/2015  . Breathlessness on exertion 11/21/2014  . Breath shortness 11/21/2014  . Arthropathy 11/07/2014  . Atrial flutter, paroxysmal ([employer]) 11/07/2014  . Type 2 diabetes mellitus ([place]) 11/07/2014  . Benign essential tremor 11/07/2014  . Benign essential HTN 11/07/2014   . Mixed incontinence 11/07/2014  . Hypercholesterolemia without hypertriglyceridemia 11/07/2014  . Apnea, sleep 11/07/2014  . Controlled type 2 diabetes mellitus without complication ([place]) 27/61/4709  . Pure hypercholesterolemia 11/07/2014  . Other abnormalities of gait and mobility 11/02/2011  . Abnormal gait 06/29/2011  . Discoordination 06/29/2011    Gwenlyn Saran, SPT  This entire session was performed under direct supervision and direction of a licensed therapist/therapist assistant . I have personally read, edited and approve of the note as written.  Myles Gip PT, DPT [phone] 07/20/2018, 5:55 PM  [facility] [address] [place], [place], [address] Phone: [phone]   Fax:  [phone]  Name: Deniel Mcquiston MRN: [id number] Date of Birth: 06-26-1941

## 2018-08-07 ENCOUNTER — Encounter: Payer: Self-pay | Admitting: Student in an Organized Health Care Education/Training Program

## 2018-08-07 ENCOUNTER — Ambulatory Visit
Payer: Medicare Other | Attending: Student in an Organized Health Care Education/Training Program | Admitting: Student in an Organized Health Care Education/Training Program

## 2018-08-07 ENCOUNTER — Other Ambulatory Visit: Payer: Self-pay

## 2018-08-07 VITALS — BP 119/72 | HR 71 | Temp 97.9°F | Resp 20 | Ht 68.75 in | Wt 193.0 lb

## 2018-08-07 DIAGNOSIS — M25561 Pain in right knee: Secondary | ICD-10-CM | POA: Insufficient documentation

## 2018-08-07 DIAGNOSIS — Z8572 Personal history of non-Hodgkin lymphomas: Secondary | ICD-10-CM | POA: Diagnosis not present

## 2018-08-07 DIAGNOSIS — Z96651 Presence of right artificial knee joint: Secondary | ICD-10-CM | POA: Diagnosis not present

## 2018-08-07 DIAGNOSIS — E7800 Pure hypercholesterolemia, unspecified: Secondary | ICD-10-CM | POA: Diagnosis not present

## 2018-08-07 DIAGNOSIS — I10 Essential (primary) hypertension: Secondary | ICD-10-CM | POA: Insufficient documentation

## 2018-08-07 DIAGNOSIS — Z8673 Personal history of transient ischemic attack (TIA), and cerebral infarction without residual deficits: Secondary | ICD-10-CM | POA: Diagnosis not present

## 2018-08-07 DIAGNOSIS — Z888 Allergy status to other drugs, medicaments and biological substances status: Secondary | ICD-10-CM | POA: Insufficient documentation

## 2018-08-07 DIAGNOSIS — Z882 Allergy status to sulfonamides status: Secondary | ICD-10-CM | POA: Insufficient documentation

## 2018-08-07 DIAGNOSIS — N3946 Mixed incontinence: Secondary | ICD-10-CM | POA: Insufficient documentation

## 2018-08-07 DIAGNOSIS — Z881 Allergy status to other antibiotic agents status: Secondary | ICD-10-CM | POA: Diagnosis not present

## 2018-08-07 DIAGNOSIS — E119 Type 2 diabetes mellitus without complications: Secondary | ICD-10-CM | POA: Insufficient documentation

## 2018-08-07 DIAGNOSIS — G250 Essential tremor: Secondary | ICD-10-CM | POA: Diagnosis not present

## 2018-08-07 DIAGNOSIS — Z880 Allergy status to penicillin: Secondary | ICD-10-CM | POA: Insufficient documentation

## 2018-08-07 DIAGNOSIS — I872 Venous insufficiency (chronic) (peripheral): Secondary | ICD-10-CM | POA: Diagnosis not present

## 2018-08-07 DIAGNOSIS — Z79899 Other long term (current) drug therapy: Secondary | ICD-10-CM | POA: Diagnosis not present

## 2018-08-07 DIAGNOSIS — M1711 Unilateral primary osteoarthritis, right knee: Secondary | ICD-10-CM | POA: Diagnosis not present

## 2018-08-07 DIAGNOSIS — G8929 Other chronic pain: Secondary | ICD-10-CM

## 2018-08-07 DIAGNOSIS — G4730 Sleep apnea, unspecified: Secondary | ICD-10-CM | POA: Diagnosis not present

## 2018-08-07 DIAGNOSIS — I4892 Unspecified atrial flutter: Secondary | ICD-10-CM | POA: Diagnosis not present

## 2018-08-07 NOTE — Progress Notes (Signed)
Patient's Name: Mike Holloway  MRN: [id number]  Referring Provider: [name], MD  DOB: 05/01/1941  PCP: [name], MD  DOS: 08/07/2018  Note by: Gillis Santa, MD  Service setting: Ambulatory outpatient  Specialty: Interventional Pain Management  Location: [facility] (AMB) Pain Management Facility    Patient type: Established   Primary Reason(s) for Visit: Encounter for post-procedure evaluation of chronic illness with mild to moderate exacerbation CC: Follow-up  HPI  Mike Holloway is a 77 y.o. year old, male patient, who comes today for a post-procedure evaluation. He has Arthropathy; Atrial flutter, paroxysmal ([place]); Type 2 diabetes mellitus ([place]); Benign essential tremor; Benign essential HTN; Mixed incontinence; Lymphoma, 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[place]); Hypercholesterolemia without hypertriglyceridemia; Breathlessness on exertion; Apnea, sleep; Abnormal gait; Controlled type 2 diabetes mellitus without complication ([place]); Discoordination; 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[place]); Other abnormalities of gait and mobility; Breath shortness; Pure hypercholesterolemia; Bladder retention; Chronic pain of right knee; Right ankle pain; Venous insufficiency of both lower extremities; Bradycardia; Contusion of right knee; Status post right unicompartmental knee replacement; Recurrent Clostridium difficile diarrhea; and HLD (hyperlipidemia) on their problem list. His primarily concern today is the Follow-up  Pain Assessment: Location: Right Knee Radiating: Denies  Onset: More than a month ago Duration: Chronic pain Quality: Constant, Aching, Discomfort Severity: 2 /10 (subjective, self-reported pain score)  Note: Reported level is compatible with observation.                         When using our objective Pain Scale, levels between 6 and 10/10 are said to belong in an emergency room, as it progressively worsens from a 6/10, described as severely limiting, requiring emergency care not usually available at an  outpatient pain management facility. At a 6/10 level, communication becomes difficult and requires great effort. Assistance to reach the emergency department may be required. Facial flushing and profuse sweating along with potentially dangerous increases in heart rate and blood pressure will be evident. Effect on ADL: "I don't a whole lot. A lot more sitting now"  Timing: Constant Modifying factors: Procedure, pain relief cream, ice and heat  BP: 119/72  HR: 71  Mike Holloway comes in today for post-procedure evaluation.  Further details on both, my assessment(s), as well as the proposed treatment plan, please see below.  Post-Procedure Assessment  07/03/2018 Procedure: Right knee genicular nerve block Pre-procedure pain score:  2/10 Post-procedure pain score: 0/10         Influential Factors: BMI: 28.71 kg/m Intra-procedural challenges: None observed.         Assessment challenges: None detected.              Reported side-effects: None.        Post-procedural adverse reactions or complications: None reported         Sedation: Please see nurses note. When no sedatives are used, the analgesic levels obtained are directly associated to the effectiveness of the local anesthetics. However, when sedation is provided, the level of analgesia obtained during the initial 1 hour following the intervention, is believed to be the result of a combination of factors. These factors may include, but are not limited to: 1. The effectiveness of the local anesthetics used. 2. The effects of the analgesic(s) and/or anxiolytic(s) used. 3. The degree of discomfort experienced by the patient at the time of the procedure. 4. The patients ability and reliability in recalling and recording the events. 5. The presence and influence of possible secondary gains and/or psychosocial  factors. Reported result: Relief experienced during the 1st hour after the procedure: 100 % (Ultra-Short Term Relief)             Interpretative annotation: Clinically appropriate result. Analgesia during this period is likely to be Local Anesthetic and/or IV Sedative (Analgesic/Anxiolytic) related.          Effects of local anesthetic: The analgesic effects attained during this period are directly associated to the localized infiltration of local anesthetics and therefore cary significant diagnostic value as to the etiological location, or anatomical origin, of the pain. Expected duration of relief is directly dependent on the pharmacodynamics of the local anesthetic used. Long-acting (4-6 hours) anesthetics used.  Reported result: Relief during the next 4 to 6 hour after the procedure: 100 % (Short-Term Relief)            Interpretative annotation: Clinically appropriate result. Analgesia during this period is likely to be Local Anesthetic-related.          Long-term benefit: Defined as the period of time past the expected duration of local anesthetics (1 hour for short-acting and 4-6 hours for long-acting). With the possible exception of prolonged sympathetic blockade from the local anesthetics, benefits during this period are typically attributed to, or associated with, other factors such as analgesic sensory neuropraxia, antiinflammatory effects, or beneficial biochemical changes provided by agents other than the local anesthetics.  Reported result: Extended relief following procedure: 0 %("100% reflief for 5 days. 2 weeks with noticeable improvement. Last week pain started to return and now it seems worse" ) (Long-Term Relief)            Interpretative annotation: Clinically possible results. Good relief. No permanent benefit expected. Inflammation plays a part in the etiology to the pain.          Current benefits: Defined as reported results that persistent at this point in time.   Analgesia: <25 %            Function: Back to baseline ROM: Back to baseline Interpretative annotation: Recurrence of symptoms. No permanent  benefit expected. Effective diagnostic intervention.          Interpretation: Results would suggest a successful diagnostic intervention.                  Plan:  Repeat treatment or therapy and compare extent and duration of benefits.                Laboratory Chemistry  Inflammation Markers (CRP: Acute Phase) (ESR: Chronic Phase) No results found for: CRP, ESRSEDRATE, LATICACIDVEN                       Rheumatology Markers No results found for: RF, ANA, LABURIC, URICUR, LYMEIGGIGMAB, LYMEABIGMQN, HLAB27                      Renal Function Markers Lab Results  Component Value Date   BUN 15 03/29/2018   CREATININE 1.11 03/29/2018   GFRAA >60 03/29/2018   GFRNONAA >60 03/29/2018                             Hepatic Function Markers Lab Results  Component Value Date   AST 23 03/24/2018   ALT 16 03/24/2018   ALBUMIN 3.3 (L) 03/24/2018   ALKPHOS 72 03/24/2018   HCVAB 0.3 03/10/2018  Electrolytes Lab Results  Component Value Date   NA 138 03/29/2018   K 4.2 03/29/2018   CL 112 (H) 03/29/2018   CALCIUM 8.8 (L) 03/29/2018   MG 1.8 03/29/2018                        Neuropathy Markers Lab Results  Component Value Date   HIV Non Reactive 03/10/2018                        CNS Tests No results found for: COLORCSF, APPEARCSF, RBCCOUNTCSF, WBCCSF, POLYSCSF, LYMPHSCSF, EOSCSF, PROTEINCSF, GLUCCSF, JCVIRUS, CSFOLI, IGGCSF                      Bone Pathology Markers No results found for: [place], [id number], [id number], [id number], 25OHVITD1, 25OHVITD2, 25OHVITD3, TESTOFREE, TESTOSTERONE                       Coagulation Parameters Lab Results  Component Value Date   PLT 118 (L) 03/25/2018                        Cardiovascular Markers Lab Results  Component Value Date   HGB 11.2 (L) 03/25/2018   HCT 32.1 (L) 03/25/2018                         CA Markers No results found for: CEA, CA125, LABCA2                      Note: Lab results  reviewed.  Recent Diagnostic Imaging Results  DG C-Arm 1-60 Min-No Report Fluoroscopy was utilized by the requesting physician.  No radiographic  interpretation.   Complexity Note: Imaging results reviewed. Results shared with Mr. Couser, using Layman's terms.                         Meds   Current Outpatient Medications:  .  Cholecalciferol (VITAMIN D-1000 MAX ST) 1000 UNITS tablet, Take 1,000 Units by mouth at bedtime. , Disp: , Rfl:  .  diclofenac sodium (VOLTAREN) 1 % GEL, Apply 2 g topically 2 (two) times daily as needed (pain). , Disp: , Rfl:  .  famotidine (PEPCID) 20 MG tablet, TK 1 T PO BID, Disp: , Rfl: 3 .  FLUoxetine (PROZAC) 10 MG tablet, Take 10 mg by mouth daily., Disp: , Rfl:  .  mirabegron ER (MYRBETRIQ) 50 MG TB24 tablet, Take 1 tablet (50 mg total) by mouth daily., Disp: 30 tablet, Rfl: 5 .  neomycin-polymyxin b-dexamethasone (MAXITROL) 3.5-10000-0.1 SUSP, SHAKE LQ AND INT 1 GTT IN OU TID FOR 14 DAYS, Disp: , Rfl: 0 .  NON FORMULARY, Real time pain relief, Disp: , Rfl:  .  primidone (MYSOLINE) 50 MG tablet, Take 50-100 mg by mouth 2 (two) times daily. 100 mg in the morning and 50 mg at bedtime, Disp: , Rfl:  .  Probiotic Product (VSL#3 PO), Take 1 capsule by mouth 2 (two) times daily. , Disp: , Rfl:  .  propranolol ER (INDERAL LA) 80 MG 24 hr capsule, Take 160 mg by mouth daily. , Disp: , Rfl:  .  tamsulosin (FLOMAX) 0.4 MG CAPS capsule, Take 0.4 mg by mouth daily., Disp: , Rfl:  .  Triamcinolone Acetonide (NASACORT AQ NA), Place 2 sprays into the nose daily. , Disp: ,  Rfl:  .  triamterene-hydrochlorothiazide (MAXZIDE-25) 37.5-25 MG tablet, TK 1 T PO QD, Disp: , Rfl: 1 .  montelukast (SINGULAIR) 10 MG tablet, Take 10 mg by mouth at bedtime., Disp: , Rfl:   ROS  Constitutional: Denies any fever or chills Gastrointestinal: No reported hemesis, hematochezia, vomiting, or acute GI distress Musculoskeletal: Denies any acute onset joint swelling, redness, loss of ROM, or  weakness Neurological: No reported episodes of acute onset apraxia, aphasia, dysarthria, agnosia, amnesia, paralysis, loss of coordination, or loss of consciousness  Allergies  Mr. Cumpian is allergic to clindamycin; flagyl [metronidazole]; ambien  [zolpidem]; penicillins; and sulfa antibiotics.  PFSH  Drug: Mr. Hayner  reports that he does not use drugs. Alcohol:  reports that he does not drink alcohol. Tobacco:  reports that he has never smoked. He has never used smokeless tobacco. Medical:  has a past medical history of Arthritis, Atrial flutter ([place]), Diabetes mellitus ([place]), Essential tremor, Hypercholesteremia, Hypertension, Incontinence, 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[place]), Sleep apnea, and Stroke ([place]). Surgical: Mr. 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 Family: family history includes Heart disease in his mother; Hematuria in his father; Prostate cancer in his father.  Constitutional Exam  General appearance: Well nourished, well developed, and well hydrated. In no apparent acute distress Vitals:   08/07/18 1339  BP: 119/72  Pulse: 71  Resp: 20  Temp: 97.9 F (36.6 C)  SpO2: 97%  Weight: 193 lb (87.5 kg)  Height: 5' 8.75" (1.746 m)   BMI Assessment: Estimated body mass index is 28.71 kg/m as calculated from the following:   Height as of this encounter: 5' 8.75" (1.746 m).   Weight as of this encounter: 193 lb (87.5 kg).  BMI interpretation table: BMI level Category Range association with higher incidence of chronic pain  <18 kg/m2 Underweight   18.5-24.9 kg/m2 Ideal body weight   25-29.9 kg/m2 Overweight Increased incidence by 20%  30-34.9 kg/m2 Obese (Class I) Increased incidence by 68%  35-39.9 kg/m2 Severe obesity  (Class II) Increased incidence by 136%  >40 kg/m2 Extreme obesity (Class III) Increased incidence by 254%   Patient's current BMI Ideal Body weight  Body mass index is 28.71 kg/m. Ideal body weight: 70.1 kg (154 lb 9.6 oz) Adjusted ideal body weight: 77.1 kg (169 lb 15.3 oz)   BMI Readings from Last 4 Encounters:  08/07/18 28.71 kg/m  07/03/18 28.71 kg/m  06/20/18 29.35 kg/m  03/27/18 30.34 kg/m   Wt Readings from Last 4 Encounters:  08/07/18 193 lb (87.5 kg)  07/03/18 193 lb (87.5 kg)  06/20/18 193 lb (87.5 kg)  03/27/18 199 lb 8.3 oz (90.5 kg)  Psych/Mental status: Alert, oriented x 3 (person, place, & time)       Eyes: PERLA Respiratory: No evidence of acute respiratory distress  Cervical Spine Area Exam  Skin & Axial Inspection: No masses, redness, edema, swelling, or associated skin lesions Alignment: Symmetrical Functional ROM: Unrestricted ROM      Stability: No instability detected Muscle Tone/Strength: Functionally intact. No obvious neuro-muscular anomalies detected. Sensory (Neurological): Unimpaired Palpation: No palpable anomalies              Upper Extremity (UE) Exam    Side: Right upper extremity  Side: Left upper extremity  Skin & Extremity Inspection: Skin color, temperature, and hair growth are WNL. No peripheral  edema or cyanosis. No masses, redness, swelling, asymmetry, or associated skin lesions. No contractures.  Skin & Extremity Inspection: Skin color, temperature, and hair growth are WNL. No peripheral edema or cyanosis. No masses, redness, swelling, asymmetry, or associated skin lesions. No contractures.  Functional ROM: Unrestricted ROM          Functional ROM: Unrestricted ROM          Muscle Tone/Strength: Functionally intact. No obvious neuro-muscular anomalies detected.  Muscle Tone/Strength: Functionally intact. No obvious neuro-muscular anomalies detected.  Sensory (Neurological): Unimpaired          Sensory (Neurological): Unimpaired           Palpation: No palpable anomalies              Palpation: No palpable anomalies              Provocative Test(s):  Phalen's test: deferred Tinel's test: deferred Apley's scratch test (touch opposite shoulder):  Action 1 (Across chest): deferred Action 2 (Overhead): deferred Action 3 (LB reach): deferred   Provocative Test(s):  Phalen's test: deferred Tinel's test: deferred Apley's scratch test (touch opposite shoulder):  Action 1 (Across chest): deferred Action 2 (Overhead): deferred Action 3 (LB reach): deferred    Thoracic Spine Area Exam  Skin & Axial Inspection: No masses, redness, or swelling Alignment: Symmetrical Functional ROM: Unrestricted ROM Stability: No instability detected Muscle Tone/Strength: Functionally intact. No obvious neuro-muscular anomalies detected. Sensory (Neurological): Unimpaired Muscle strength & Tone: No palpable anomalies   Lower Extremity Exam    Side: Right lower extremity  Side: Left lower extremity  Stability: No instability observed          Stability: No instability observed          Skin & Extremity Inspection: Skin color, temperature, and hair growth are WNL. No peripheral edema or cyanosis. No masses, redness, swelling, asymmetry, or associated skin lesions. No contractures.  Skin & Extremity Inspection: Skin color, temperature, and hair growth are WNL. No peripheral edema or cyanosis. No masses, redness, swelling, asymmetry, or associated skin lesions. No contractures.  Functional ROM: Improved after treatment however now back to baseline                  Functional ROM: Unrestricted ROM                  Muscle Tone/Strength: Functionally intact. No obvious neuro-muscular anomalies detected.  Muscle Tone/Strength: Functionally intact. No obvious neuro-muscular anomalies detected.  Sensory (Neurological): Arthropathic arthralgia  Sensory (Neurological): Unimpaired  Palpation: Complains of area being tender to palpation  Palpation: No  palpable anomalies   Assessment  Primary Diagnosis & Pertinent Problem List: The primary encounter diagnosis was Primary osteoarthritis of right knee. Diagnoses of Chronic pain of right knee and Hx of total knee arthroplasty, right were also pertinent to this visit.  Status Diagnosis  Responding Responding Persistent 1. Primary osteoarthritis of right knee   2. Chronic pain of right knee   3. Hx of total knee arthroplasty, right     General Recommendations: The pain condition that the patient suffers from is best treated with a multidisciplinary approach that involves an increase in physical activity to prevent de-conditioning and worsening of the pain cycle, as well as psychological counseling (formal and/or informal) to address the co-morbid psychological affects of pain. Treatment will often involve judicious use of pain medications and interventional procedures to decrease the pain, allowing the patient to participate in the physical activity that will  ultimately produce long-lasting pain reductions. The goal of the multidisciplinary approach is to return the patient to a higher level of overall function and to restore their ability to perform activities of daily living.  77 year old male with a history of total right knee arthroplasty, right knee osteoarthritis who follows up status post right knee genicular nerve block #1 on 07/03/2018.  Patient notes significant pain relief for the first 2-2 and half weeks after his block.  He states that he was able to participate with physical therapy and they to noted improvement in his range of motion and his gait.  The patient and his wife take a trip out of town to [place] and they state that he fared much better in regards to his knee pain compared to if he had not received a nerve block.  Given that the patient's pain has returned back to pre-block levels, we discussed repeating right knee genicular nerve block followed possibly by radiofrequency  ablation.  Risks and benefits were discussed and patient like to proceed.  Plan: -Right knee genicular nerve block #2 without sedation  Lab-work, procedure(s), and/or referral(s): Orders Placed This Encounter  Procedures  . GENICULAR NERVE BLOCK   Provider-requested follow-up: Return in about 2 weeks (around 08/21/2018) for Procedure.  Time Note: Greater than 50% of the 25 minute(s) of face-to-face time spent with Mr. Myler, was spent in counseling/coordination of care regarding: Mr. Umstead primary cause of pain, the treatment plan, treatment alternatives, the risks and possible complications of proposed treatment, going over the informed consent, the results, interpretation and significance of  his recent diagnostic interventional treatment(s), realistic expectations and the goals of pain management (increased in functionality).  Future Appointments  Date Time Provider [place]  08/23/2018 10:00 AM Gillis Santa, MD [facility] None  09/14/2018 10:45 AM [facility] None    Primary Care Physician: [name], MD Location: [facility] Note by: Gillis Santa, M.D Date: 08/07/2018; Time: 2:34 PM  Patient Instructions   1. Can try teds cream 2. Schedule for R genicular nerve block #2 GENERAL RISKS AND COMPLICATIONS  What are the risk, side effects and possible complications? Generally speaking, most procedures are safe.  However, with any procedure there are risks, side effects, and the possibility of complications.  The risks and complications are dependent upon the sites that are lesioned, or the type of nerve block to be performed.  The closer the procedure is to the spine, the more serious the risks are.  Great care is taken when placing the radio frequency needles, block needles or lesioning probes, but sometimes complications can occur. 1. Infection: Any time there is an injection through the skin, there is a  risk of infection.  This is why sterile conditions are used for these blocks.  There are four possible types of infection. 1. Localized skin infection. 2. Central Nervous System Infection-This can be in the form of Meningitis, which can be deadly. 3. Epidural Infections-This can be in the form of an epidural abscess, which can cause pressure inside of the spine, causing compression of the spinal cord with subsequent paralysis. This would require an emergency surgery to decompress, and there are no guarantees that the patient would recover from the paralysis. 4. Discitis-This is an infection of the intervertebral discs.  It occurs in about 1% of discography procedures.  It is difficult to treat and it may lead to surgery.        2. Pain: the needles have  to go through skin and soft tissues, will cause soreness.       3. Damage to internal structures:  The nerves to be lesioned may be near blood vessels or    other nerves which can be potentially damaged.       4. Bleeding: Bleeding is more common if the patient is taking blood thinners such as  aspirin, Coumadin, Ticiid, Plavix, etc., or if he/she have some genetic predisposition  such as hemophilia. Bleeding into the spinal canal can cause compression of the spinal  cord with subsequent paralysis.  This would require an emergency surgery to  decompress and there are no guarantees that the patient would recover from the  paralysis.       5. Pneumothorax:  Puncturing of a lung is a possibility, every time a needle is introduced in  the area of the chest or upper back.  Pneumothorax refers to free air around the  collapsed lung(s), inside of the thoracic cavity (chest cavity).  Another two possible  complications related to a similar event would include: Hemothorax and Chylothorax.   These are variations of the Pneumothorax, where instead of air around the collapsed  lung(s), you may have blood or chyle, respectively.       6. Spinal headaches: They may  occur with any procedures in the area of the spine.       7. Persistent CSF (Cerebro-Spinal Fluid) leakage: This is a rare problem, but may occur  with prolonged intrathecal or epidural catheters either due to the formation of a fistulous  track or a dural tear.       8. Nerve damage: By working so close to the spinal cord, there is always a possibility of  nerve damage, which could be as serious as a permanent spinal cord injury with  paralysis.       9. Death:  Although rare, severe deadly allergic reactions known as "Anaphylactic  reaction" can occur to any of the medications used.      10. Worsening of the symptoms:  We can always make thing worse.  What are the chances of something like this happening? Chances of any of this occuring are extremely low.  By statistics, you have more of a chance of getting killed in a motor vehicle accident: while driving to the hospital than any of the above occurring .  Nevertheless, you should be aware that they are possibilities.  In general, it is similar to taking a shower.  Everybody knows that you can slip, hit your head and get killed.  Does that mean that you should not shower again?  Nevertheless always keep in mind that statistics do not mean anything if you happen to be on the wrong side of them.  Even if a procedure has a 1 (one) in a 1,000,000 (million) chance of going wrong, it you happen to be that one..Also, keep in mind that by statistics, you have more of a chance of having something go wrong when taking medications.  Who should not have this procedure? If you are on a blood thinning medication (e.g. Coumadin, Plavix, see list of "Blood Thinners"), or if you have an active infection going on, you should not have the procedure.  If you are taking any blood thinners, please inform your physician.  How should I prepare for this procedure?  Do not eat or drink anything at least six hours prior to the procedure.  Bring a driver with you .  It cannot  be a taxi.  Come accompanied by an adult that can drive you back, and that is strong enough to help you if your legs get weak or numb from the local anesthetic.  Take all of your medicines the morning of the procedure with just enough water to swallow them.  If you have diabetes, make sure that you are scheduled to have your procedure done first thing in the morning, whenever possible.  If you have diabetes, take only half of your insulin dose and notify our nurse that you have done so as soon as you arrive at the clinic.  If you are diabetic, but only take blood sugar pills (oral hypoglycemic), then do not take them on the morning of your procedure.  You may take them after you have had the procedure.  Do not take aspirin or any aspirin-containing medications, at least eleven (11) days prior to the procedure.  They may prolong bleeding.  Wear loose fitting clothing that may be easy to take off and that you would not mind if it got stained with Betadine or blood.  Do not wear any jewelry or perfume  Remove any nail coloring.  It will interfere with some of our monitoring equipment.  NOTE: Remember that this is not meant to be interpreted as a complete list of all possible complications.  Unforeseen problems may occur.  BLOOD THINNERS The following drugs contain aspirin or other products, which can cause increased bleeding during surgery and should not be taken for 2 weeks prior to and 1 week after surgery.  If you should need take something for relief of minor pain, you may take acetaminophen which is found in Tylenol,m Datril, Anacin-3 and Panadol. It is not blood thinner. The products listed below are.  Do not take any of the products listed below in addition to any listed on your instruction sheet.  A.P.C or A.P.C with Codeine Codeine Phosphate Capsules #3 Ibuprofen Ridaura  ABC compound Congesprin Imuran rimadil  Advil Cope Indocin Robaxisal  Alka-Seltzer Effervescent Pain Reliever  and Antacid Coricidin or Coricidin-D  Indomethacin Rufen  Alka-Seltzer plus Cold Medicine Cosprin Ketoprofen S-A-C Tablets  Anacin Analgesic Tablets or Capsules Coumadin Korlgesic Salflex  Anacin Extra Strength Analgesic tablets or capsules CP-2 Tablets Lanoril Salicylate  Anaprox Cuprimine Capsules Levenox Salocol  Anexsia-D Dalteparin Magan Salsalate  Anodynos Darvon compound Magnesium Salicylate Sine-off  Ansaid Dasin Capsules Magsal Sodium Salicylate  Anturane Depen Capsules Marnal Soma  APF Arthritis pain formula Dewitt's Pills Measurin Stanback  Argesic Dia-Gesic Meclofenamic Sulfinpyrazone  Arthritis Bayer Timed Release Aspirin Diclofenac Meclomen Sulindac  Arthritis pain formula Anacin Dicumarol Medipren Supac  Analgesic (Safety coated) Arthralgen Diffunasal Mefanamic Suprofen  Arthritis Strength Bufferin Dihydrocodeine Mepro Compound Suprol  Arthropan liquid Dopirydamole Methcarbomol with Aspirin Synalgos  ASA tablets/Enseals Disalcid Micrainin Tagament  Ascriptin Doan's Midol Talwin  Ascriptin A/D Dolene Mobidin Tanderil  Ascriptin Extra Strength Dolobid Moblgesic Ticlid  Ascriptin with Codeine Doloprin or Doloprin with Codeine Momentum Tolectin  Asperbuf Duoprin Mono-gesic Trendar  Aspergum Duradyne Motrin or Motrin IB Triminicin  Aspirin plain, buffered or enteric coated Durasal Myochrisine Trigesic  Aspirin Suppositories Easprin Nalfon Trillsate  Aspirin with Codeine Ecotrin Regular or Extra Strength Naprosyn Uracel  Atromid-S Efficin Naproxen Ursinus  Auranofin Capsules Elmiron Neocylate Vanquish  Axotal Emagrin Norgesic Verin  Azathioprine Empirin or Empirin with Codeine Normiflo Vitamin E  Azolid Emprazil Nuprin Voltaren  Bayer Aspirin plain, buffered or children's or timed BC Tablets or powders Encaprin Orgaran Warfarin Sodium  Buff-a-Comp Enoxaparin  Orudis Zorpin  Buff-a-Comp with Codeine Equegesic Os-Cal-Gesic   Buffaprin Excedrin plain, buffered or Extra  Strength Oxalid   Bufferin Arthritis Strength Feldene Oxphenbutazone   Bufferin plain or Extra Strength Feldene Capsules Oxycodone with Aspirin   Bufferin with Codeine Fenoprofen Fenoprofen Pabalate or Pabalate-SF   Buffets II Flogesic Panagesic   Buffinol plain or Extra Strength Florinal or Florinal with Codeine Panwarfarin   Buf-Tabs Flurbiprofen Penicillamine   Butalbital Compound Four-way cold tablets Penicillin   Butazolidin Fragmin Pepto-Bismol   Carbenicillin Geminisyn Percodan   Carna Arthritis Reliever Geopen Persantine   Carprofen Gold's salt Persistin   Chloramphenicol Goody's Phenylbutazone   Chloromycetin Haltrain Piroxlcam   Clmetidine heparin Plaquenil   Cllnoril Hyco-pap Ponstel   Clofibrate Hydroxy chloroquine Propoxyphen         Before stopping any of these medications, be sure to consult the physician who ordered them.  Some, such as Coumadin (Warfarin) are ordered to prevent or treat serious conditions such as "deep thrombosis", "pumonary embolisms", and other heart problems.  The amount of time that you may need off of the medication may also vary with the medication and the reason for which you were taking it.  If you are taking any of these medications, please make sure you notify your pain physician before you undergo any procedures.

## 2018-08-07 NOTE — Progress Notes (Signed)
Safety precautions to be maintained throughout the outpatient stay will include: orient to surroundings, keep bed in low position, maintain call bell within reach at all times, provide assistance with transfer out of bed and ambulation.  

## 2018-08-07 NOTE — Patient Instructions (Addendum)
1. Can try teds cream 2. Schedule for R genicular nerve block #2 GENERAL RISKS AND COMPLICATIONS  What are the risk, side effects and possible complications? Generally speaking, most procedures are safe.  However, with any procedure there are risks, side effects, and the possibility of complications.  The risks and complications are dependent upon the sites that are lesioned, or the type of nerve block to be performed.  The closer the procedure is to the spine, the more serious the risks are.  Great care is taken when placing the radio frequency needles, block needles or lesioning probes, but sometimes complications can occur. 1. Infection: Any time there is an injection through the skin, there is a risk of infection.  This is why sterile conditions are used for these blocks.  There are four possible types of infection. 1. Localized skin infection. 2. Central Nervous System Infection-This can be in the form of Meningitis, which can be deadly. 3. Epidural Infections-This can be in the form of an epidural abscess, which can cause pressure inside of the spine, causing compression of the spinal cord with subsequent paralysis. This would require an emergency surgery to decompress, and there are no guarantees that the patient would recover from the paralysis. 4. Discitis-This is an infection of the intervertebral discs.  It occurs in about 1% of discography procedures.  It is difficult to treat and it may lead to surgery.        2. Pain: the needles have to go through skin and soft tissues, will cause soreness.       3. Damage to internal structures:  The nerves to be lesioned may be near blood vessels or    other nerves which can be potentially damaged.       4. Bleeding: Bleeding is more common if the patient is taking blood thinners such as  aspirin, Coumadin, Ticiid, Plavix, etc., or if he/she have some genetic predisposition  such as hemophilia. Bleeding into the spinal canal can cause compression of  the spinal  cord with subsequent paralysis.  This would require an emergency surgery to  decompress and there are no guarantees that the patient would recover from the  paralysis.       5. Pneumothorax:  Puncturing of a lung is a possibility, every time a needle is introduced in  the area of the chest or upper back.  Pneumothorax refers to free air around the  collapsed lung(s), inside of the thoracic cavity (chest cavity).  Another two possible  complications related to a similar event would include: Hemothorax and Chylothorax.   These are variations of the Pneumothorax, where instead of air around the collapsed  lung(s), you may have blood or chyle, respectively.       6. Spinal headaches: They may occur with any procedures in the area of the spine.       7. Persistent CSF (Cerebro-Spinal Fluid) leakage: This is a rare problem, but may occur  with prolonged intrathecal or epidural catheters either due to the formation of a fistulous  track or a dural tear.       8. Nerve damage: By working so close to the spinal cord, there is always a possibility of  nerve damage, which could be as serious as a permanent spinal cord injury with  paralysis.       9. Death:  Although rare, severe deadly allergic reactions known as "Anaphylactic  reaction" can occur to any of the medications used.  10. Worsening of the symptoms:  We can always make thing worse.  What are the chances of something like this happening? Chances of any of this occuring are extremely low.  By statistics, you have more of a chance of getting killed in a motor vehicle accident: while driving to the hospital than any of the above occurring .  Nevertheless, you should be aware that they are possibilities.  In general, it is similar to taking a shower.  Everybody knows that you can slip, hit your head and get killed.  Does that mean that you should not shower again?  Nevertheless always keep in mind that statistics do not mean anything if you  happen to be on the wrong side of them.  Even if a procedure has a 1 (one) in a 1,000,000 (million) chance of going wrong, it you happen to be that one..Also, keep in mind that by statistics, you have more of a chance of having something go wrong when taking medications.  Who should not have this procedure? If you are on a blood thinning medication (e.g. Coumadin, Plavix, see list of "Blood Thinners"), or if you have an active infection going on, you should not have the procedure.  If you are taking any blood thinners, please inform your physician.  How should I prepare for this procedure?  Do not eat or drink anything at least six hours prior to the procedure.  Bring a driver with you .  It cannot be a taxi.  Come accompanied by an adult that can drive you back, and that is strong enough to help you if your legs get weak or numb from the local anesthetic.  Take all of your medicines the morning of the procedure with just enough water to swallow them.  If you have diabetes, make sure that you are scheduled to have your procedure done first thing in the morning, whenever possible.  If you have diabetes, take only half of your insulin dose and notify our nurse that you have done so as soon as you arrive at the clinic.  If you are diabetic, but only take blood sugar pills (oral hypoglycemic), then do not take them on the morning of your procedure.  You may take them after you have had the procedure.  Do not take aspirin or any aspirin-containing medications, at least eleven (11) days prior to the procedure.  They may prolong bleeding.  Wear loose fitting clothing that may be easy to take off and that you would not mind if it got stained with Betadine or blood.  Do not wear any jewelry or perfume  Remove any nail coloring.  It will interfere with some of our monitoring equipment.  NOTE: Remember that this is not meant to be interpreted as a complete list of all possible complications.   Unforeseen problems may occur.  BLOOD THINNERS The following drugs contain aspirin or other products, which can cause increased bleeding during surgery and should not be taken for 2 weeks prior to and 1 week after surgery.  If you should need take something for relief of minor pain, you may take acetaminophen which is found in Tylenol,m Datril, Anacin-3 and Panadol. It is not blood thinner. The products listed below are.  Do not take any of the products listed below in addition to any listed on your instruction sheet.  A.P.C or A.P.C with Codeine Codeine Phosphate Capsules #3 Ibuprofen Ridaura  ABC compound Congesprin Imuran rimadil  Advil Cope Indocin Robaxisal  Alka-Seltzer Effervescent Pain Reliever and Antacid Coricidin or Coricidin-D  Indomethacin Rufen  Alka-Seltzer plus Cold Medicine Cosprin Ketoprofen S-A-C Tablets  Anacin Analgesic Tablets or Capsules Coumadin Korlgesic Salflex  Anacin Extra Strength Analgesic tablets or capsules CP-2 Tablets Lanoril Salicylate  Anaprox Cuprimine Capsules Levenox Salocol  Anexsia-D Dalteparin Magan Salsalate  Anodynos Darvon compound Magnesium Salicylate Sine-off  Ansaid Dasin Capsules Magsal Sodium Salicylate  Anturane Depen Capsules Marnal Soma  APF Arthritis pain formula Dewitt's Pills Measurin Stanback  Argesic Dia-Gesic Meclofenamic Sulfinpyrazone  Arthritis Bayer Timed Release Aspirin Diclofenac Meclomen Sulindac  Arthritis pain formula Anacin Dicumarol Medipren Supac  Analgesic (Safety coated) Arthralgen Diffunasal Mefanamic Suprofen  Arthritis Strength Bufferin Dihydrocodeine Mepro Compound Suprol  Arthropan liquid Dopirydamole Methcarbomol with Aspirin Synalgos  ASA tablets/Enseals Disalcid Micrainin Tagament  Ascriptin Doan's Midol Talwin  Ascriptin A/D Dolene Mobidin Tanderil  Ascriptin Extra Strength Dolobid Moblgesic Ticlid  Ascriptin with Codeine Doloprin or Doloprin with Codeine Momentum Tolectin  Asperbuf Duoprin Mono-gesic  Trendar  Aspergum Duradyne Motrin or Motrin IB Triminicin  Aspirin plain, buffered or enteric coated Durasal Myochrisine Trigesic  Aspirin Suppositories Easprin Nalfon Trillsate  Aspirin with Codeine Ecotrin Regular or Extra Strength Naprosyn Uracel  Atromid-S Efficin Naproxen Ursinus  Auranofin Capsules Elmiron Neocylate Vanquish  Axotal Emagrin Norgesic Verin  Azathioprine Empirin or Empirin with Codeine Normiflo Vitamin E  Azolid Emprazil Nuprin Voltaren  Bayer Aspirin plain, buffered or children's or timed BC Tablets or powders Encaprin Orgaran Warfarin Sodium  Buff-a-Comp Enoxaparin Orudis Zorpin  Buff-a-Comp with Codeine Equegesic Os-Cal-Gesic   Buffaprin Excedrin plain, buffered or Extra Strength Oxalid   Bufferin Arthritis Strength Feldene Oxphenbutazone   Bufferin plain or Extra Strength Feldene Capsules Oxycodone with Aspirin   Bufferin with Codeine Fenoprofen Fenoprofen Pabalate or Pabalate-SF   Buffets II Flogesic Panagesic   Buffinol plain or Extra Strength Florinal or Florinal with Codeine Panwarfarin   Buf-Tabs Flurbiprofen Penicillamine   Butalbital Compound Four-way cold tablets Penicillin   Butazolidin Fragmin Pepto-Bismol   Carbenicillin Geminisyn Percodan   Carna Arthritis Reliever Geopen Persantine   Carprofen Gold's salt Persistin   Chloramphenicol Goody's Phenylbutazone   Chloromycetin Haltrain Piroxlcam   Clmetidine heparin Plaquenil   Cllnoril Hyco-pap Ponstel   Clofibrate Hydroxy chloroquine Propoxyphen         Before stopping any of these medications, be sure to consult the physician who ordered them.  Some, such as Coumadin (Warfarin) are ordered to prevent or treat serious conditions such as "deep thrombosis", "pumonary embolisms", and other heart problems.  The amount of time that you may need off of the medication may also vary with the medication and the reason for which you were taking it.  If you are taking any of these medications, please make sure  you notify your pain physician before you undergo any procedures.

## 2018-08-09 ENCOUNTER — Ambulatory Visit: Payer: Medicare Other | Attending: Neurosurgery | Admitting: Physical Therapy

## 2018-08-09 DIAGNOSIS — R269 Unspecified abnormalities of gait and mobility: Secondary | ICD-10-CM | POA: Insufficient documentation

## 2018-08-09 DIAGNOSIS — R293 Abnormal posture: Secondary | ICD-10-CM

## 2018-08-09 DIAGNOSIS — M6281 Muscle weakness (generalized): Secondary | ICD-10-CM | POA: Diagnosis not present

## 2018-08-09 NOTE — Therapy (Addendum)
[facility] [address] [place], [place], [address] Phone: [phone]   Fax:  [phone]  Physical Therapy Treatment/Progress Note Dates of Reporting Period 06/28/2018 to 08/09/2018  Patient Details  Name: Mike Holloway MRN: [id number] Date of Birth: Nov 11, 1940 Referring Provider (PT): Dr. Manson Passey   Encounter Date: 08/09/2018  PT End of Session - 08/13/18 1419    Visit Number  24    Number of Visits  32    Date for PT Re-Evaluation  09/06/18    PT Start Time  7124    PT Stop Time  1357    PT Time Calculation (min)  58 min    Equipment Utilized During Treatment  Gait belt    Activity Tolerance  Patient tolerated treatment well    Behavior During Therapy  [facility] for tasks assessed/performed       Past Medical History:  Diagnosis Date  . Arthritis   . Atrial flutter ([place])   . Diabetes mellitus ([place])   . Essential tremor   . Hypercholesteremia   . Hypertension   . Incontinence   . 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[place])    grew on the testical  . Sleep apnea   . Stroke [facility])     Past Surgical History:  Procedure Laterality Date  . ABLATION    . APPENDECTOMY    . CATARACT EXTRACTION, BILATERAL    . COLONOSCOPY WITH PROPOFOL N/A 03/17/2018   Procedure: COLONOSCOPY WITH PROPOFOL;  Surgeon: Jonathon Bellows, MD;  Location: [facility] ENDOSCOPY;  Service: Gastroenterology;  Laterality: N/A;  . DEEP BRAIN STIMULATOR PLACEMENT    . 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 HEMORRHOID SURGERY    . HERNIA REPAIR    . NASAL SINUS SURGERY    . ORCHIECTOMY    . TONSILLECTOMY      There were no vitals filed for this visit.  Subjective Assessment - 08/13/18 1414    Subjective  Pt. has been out of town over past couple weeks to visit family in [place].  Pt. reports several days of eyelid issues during trip.  No falls  reported but decrease overall activity/ walking.      Patient is accompained by:  Family member    Limitations  Lifting;Standing;Walking;House hold activities    Patient Stated Goals  Improve LE strength/ gait and balance with daily tasks.  Return to using cane.      Currently in Pain?  No/denies        11/27: R knee injection [facility] PT Assessment - 08/13/18 0001      Assessment   Medical Diagnosis  Essential Tremor/ Abnormality of gait and mobility.      Referring Provider (PT)  Dr. Manson Passey    Onset Date/Surgical Date  01/10/18    Prior Therapy  yes      Balance Screen   Has the patient fallen in the past 6 months  No      Prior Function   Level of Independence  Requires assistive device for independence           TREATMENT Neuromuscular Re-education  Star Excursion with Large Amplitude Movements to reinforce postural correction.  Focus on self-correction.   Hallway Obstacle Course (6"step ups,3" step-ups,cone toe taps, Airex pad step ups/marches)x1no AD, (MinA), x1w/AD (CGA), 1   LOB noted with wall-touch correction, but no falls noted. Stair  climbing with reciprocal gait pattern and min use of UE for support x10 Functional reaching: varying positions L/R  Patient required less frequent rest breaks today and no increased pain.   TherapeuticEx:  GTB ex. Standing hip ex./ lateral and forward walking.   3" step ups/overs 10x2 on L/R.  Step over plinths in //-bars with gait pattern instruction.    All TherEx performed in // bars for safety.  MH applied at conclusion of therapy, 10 min, not billed   PT Long Term Goals - 08/13/18 1427      PT LONG TERM GOAL #2   Title  Pt. will increase Berg balance test to >45 out of 56 to improve independence with gait/ decrease fall risk.      Baseline  Berg: 39/56; 8/28 Berg: 43/56; 9/25 Berg: 44/56.  11/13: 46/56    Time  4    Period  Weeks    Status  Achieved    Target Date  09/06/18      PT LONG TERM  GOAL #4   Title  Pt. able to stand from normal chair with no UE assist to improve safety/independence with transfers.     Baseline  multiple attempts/ benefits from 2" lift    Time  4    Period  Weeks    Status  Partially Met    Target Date  09/06/18      PT LONG TERM GOAL #5   Title  Pt. able to ambulate on even surface for 100 ft without AD requiring only SBA.    Baseline  Pt. requires CGA with noted LOB when ambulating without AD.    Time  4    Period  Weeks    Status  Partially Met    Target Date  09/06/18            Plan - 08/13/18 1420    Clinical Impression Statement  Pt. entered PT with use of single loftstrand and states his eyes are going okay today.  Pt. has MD appt. with eyelid specialist scheduled (2/4) and is hoping to get better answers.  Pt. returns to Dr. Lateef for another procedure in a couple weeks.  Pt. continues to perform well with increasingly difficult balance exercises with improved ability to self-correct LOB during functional reaching/ cone propriception tasks.  Pt. continues to improve with balance and stability on more unstable surfaces.  See updated goals.  Pt. will benefit from continued PT services to improve LE muscle strengthening and safety/ independence with gait/ mobility.  PT recommends a frequency of 1-2x/week with skilled tx. session and focus on walking/ HEP.      Clinical Presentation  Evolving    Clinical Decision Making  Moderate    Rehab Potential  Fair    PT Frequency  2x / week    PT Duration  4 weeks    PT Treatment/Interventions  ADLs/Self Care Home Management;Therapeutic activities;Functional mobility training;Stair training;Gait training;Therapeutic exercise;Balance training;Neuromuscular re-education;Patient/family education;Manual techniques;Passive range of motion    PT Next Visit Plan  Progress LE strengthening/ discuss HEP    PT Home Exercise Plan  See handouts       Patient will benefit from skilled therapeutic intervention  in order to improve the following deficits and impairments:  Abnormal gait, Decreased balance, Decreased endurance, Decreased mobility, Difficulty walking, Decreased range of motion, Decreased activity tolerance, Decreased strength, Impaired flexibility, Postural dysfunction, Pain  Visit Diagnosis: Muscle weakness (generalized)  Gait difficulty  Abnormal posture       Problem List Patient Active Problem List   Diagnosis Date Noted  . 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 HLD (hyperlipidemia) 03/24/2018  . Contusion of right knee 02/14/2018  . Bradycardia 12/28/2017  . 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 Chronic pain of right knee 08/10/2016  . Right ankle pain 08/10/2016  . Venous insufficiency of both lower extremities 05/13/2016  . 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HCC) 12/11/2015  . 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 Lymphoma, 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HCC) 04/03/2015  . Breathlessness on exertion 11/21/2014  . Breath shortness 11/21/2014  . Arthropathy 11/07/2014  . Atrial flutter, paroxysmal (HCC) 11/07/2014  . Type 2 diabetes mellitus (HCC) 11/07/2014  . Benign essential tremor 11/07/2014  . Benign essential HTN 11/07/2014  . Mixed incontinence 11/07/2014  . Hypercholesterolemia without hypertriglyceridemia 11/07/2014  . Apnea, sleep 11/07/2014  . Controlled type 2 diabetes mellitus without complication (HCC) 11/07/2014  . Pure hypercholesterolemia 11/07/2014  . Other abnormalities of gait and mobility 11/02/2011  . Abnormal gait 06/29/2011  . Discoordination 06/29/2011   Michael C Sherk, PT, DPT # [id number] 08/13/2018, 3:36 PM  [facility] [address] [place], [place], [address] Phone: [phone]   Fax:  [phone]  Name: Mike Holloway MRN: [id number] Date of Birth: 10/28/1940   

## 2018-08-13 ENCOUNTER — Encounter: Payer: Self-pay | Admitting: Physical Therapy

## 2018-08-16 ENCOUNTER — Ambulatory Visit: Payer: Medicare Other | Admitting: Physical Therapy

## 2018-08-16 DIAGNOSIS — M6281 Muscle weakness (generalized): Secondary | ICD-10-CM

## 2018-08-16 DIAGNOSIS — R293 Abnormal posture: Secondary | ICD-10-CM

## 2018-08-16 DIAGNOSIS — R269 Unspecified abnormalities of gait and mobility: Secondary | ICD-10-CM

## 2018-08-16 DIAGNOSIS — I340 Nonrheumatic mitral (valve) insufficiency: Secondary | ICD-10-CM | POA: Insufficient documentation

## 2018-08-17 ENCOUNTER — Other Ambulatory Visit: Payer: Self-pay | Admitting: Internal Medicine

## 2018-08-17 DIAGNOSIS — I82402 Acute embolism and thrombosis of unspecified deep veins of left lower extremity: Secondary | ICD-10-CM

## 2018-08-18 ENCOUNTER — Encounter: Payer: Self-pay | Admitting: Physical Therapy

## 2018-08-18 NOTE — Therapy (Signed)
[facility] [address]. [place], [place], [address] Phone: [phone]   Fax:  [phone]  Physical Therapy Treatment  Patient Details  Name: Mike Holloway MRN: [id number] Date of Birth: Mar 25, 1941 Referring Provider (PT): Dr. Manson Passey   Encounter Date: 08/16/2018  PT End of Session - 08/18/18 1019    Visit Number  25    Number of Visits  32    Date for PT Re-Evaluation  09/06/18    PT Start Time  0277    PT Stop Time  1438    PT Time Calculation (min)  55 min    Equipment Utilized During Treatment  Gait belt    Activity Tolerance  Patient tolerated treatment well    Behavior During Therapy  [facility] for tasks assessed/performed       Past Medical History:  Diagnosis Date  . Arthritis   . Atrial flutter ([place])   . Diabetes mellitus ([place])   . Essential tremor   . Hypercholesteremia   . Hypertension   . Incontinence   . 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[place])    grew on the testical  . Sleep apnea   . Stroke [facility])     Past Surgical History:  Procedure Laterality Date  . ABLATION    . APPENDECTOMY    . CATARACT EXTRACTION, BILATERAL    . COLONOSCOPY WITH PROPOFOL N/A 03/17/2018   Procedure: COLONOSCOPY WITH PROPOFOL;  Surgeon: Jonathon Bellows, MD;  Location: [facility] ENDOSCOPY;  Service: Gastroenterology;  Laterality: N/A;  . DEEP BRAIN STIMULATOR PLACEMENT    . 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 ENDOSCOPY;  Service: Gastroenterology;  Laterality: N/A;  . HEMORRHOID SURGERY    . HERNIA REPAIR    . NASAL SINUS SURGERY    . ORCHIECTOMY    . TONSILLECTOMY      There were no vitals filed for this visit.  Subjective Assessment - 08/18/18 1017    Subjective  Pt. entered PT with no new complaints.  Pt. states eyes are doing okay today.  No knee pain reported prior to PT tx. session.      Patient is accompained by:  Family member    Limitations   Lifting;Standing;Walking;House hold activities    Patient Stated Goals  Improve LE strength/ gait and balance with daily tasks.  Return to using cane.      Currently in Pain?  No/denies         TREATMENT Neuromuscular Re-education  Hallway walking/ turning tasks with and without use of single loftstrand Stair climbing with reciprocal gait pattern and min use of UE for support x10 Step ups/downs on 6" step with light //-bar assist/ use of mirror for posture Step over 3" plinth in //-bars working on consistent hip flexion/ step pattern Walking cone taps at agility ladder (1 LOB with PT assist required)   TherapeuticEx:  GTB ex. Standing hip ex./ lateral and forward walking.   Seated hip flexion/ abd. With cone taps (varying positions) Seated LAQ/ sit to stands with Airex pad in green chair 10x.   MH applied at conclusion of therapy, 10 min, not billed    PT Long Term Goals - 08/13/18 1427      PT LONG TERM GOAL #2   Title  Pt. will increase Berg balance test to >45 out of 56 to improve independence with gait/ decrease fall risk.      Baseline  Berg: 39/56;  8/28 Berg: 43/56; 9/25 Berg: 44/56.  11/13: 46/56    Time  4    Period  Weeks    Status  Achieved    Target Date  09/06/18      PT LONG TERM GOAL #4   Title  Pt. able to stand from normal chair with no UE assist to improve safety/independence with transfers.     Baseline  multiple attempts/ benefits from 2" lift    Time  4    Period  Weeks    Status  Partially Met    Target Date  09/06/18      PT LONG TERM GOAL #5   Title  Pt. able to ambulate on even surface for 100 ft without AD requiring only SBA.    Baseline  Pt. requires CGA with noted LOB when ambulating without AD.    Time  4    Period  Weeks    Status  Partially Met    Target Date  09/06/18         Plan - 08/18/18 1019    Clinical Impression Statement  1 LOB during turning balance ex at end of agility ladder.  Pt. required min. A to  self-correct and return to upright standing posture.  Slight increase in R knee pain discomfort with step ups/down on 6" step.  Pt. benefits from mod. R UE assist with R hip flexion/ cone touches outside of //-bars.  Pt. will continue to ambulate with single loftstrand crutch at home/ community.      Clinical Presentation  Evolving    Clinical Decision Making  Moderate    Rehab Potential  Fair    PT Frequency  2x / week    PT Duration  4 weeks    PT Treatment/Interventions  ADLs/Self Care Home Management;Therapeutic activities;Functional mobility training;Stair training;Gait training;Therapeutic exercise;Balance training;Neuromuscular re-education;Patient/family education;Manual techniques;Passive range of motion    PT Next Visit Plan  Progress LE strengthening/ discuss HEP    PT Home Exercise Plan  See handouts       Patient will benefit from skilled therapeutic intervention in order to improve the following deficits and impairments:  Abnormal gait, Decreased balance, Decreased endurance, Decreased mobility, Difficulty walking, Decreased range of motion, Decreased activity tolerance, Decreased strength, Impaired flexibility, Postural dysfunction, Pain  Visit Diagnosis: Muscle weakness (generalized)  Gait difficulty  Abnormal posture     Problem List Patient Active Problem List   Diagnosis Date Noted  . 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 HLD (hyperlipidemia) 03/24/2018  . Contusion of right knee 02/14/2018  . Bradycardia 12/28/2017  . 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 Chronic pain of right knee 08/10/2016  . Right ankle pain 08/10/2016  . Venous insufficiency of both lower extremities 05/13/2016  . 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[place]) 12/11/2015  . 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 Lymphoma, 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[place]) 04/03/2015  . Breathlessness on exertion 11/21/2014  . Breath shortness 11/21/2014  . Arthropathy 11/07/2014  . Atrial  flutter, paroxysmal ([place]) 11/07/2014  . Type 2 diabetes mellitus ([place]) 11/07/2014  . Benign essential tremor 11/07/2014  . Benign essential HTN 11/07/2014  . Mixed incontinence 11/07/2014  . Hypercholesterolemia without hypertriglyceridemia 11/07/2014  . Apnea, sleep 11/07/2014  . Controlled type 2 diabetes mellitus without complication ([place]) 05/10/4817  . Pure hypercholesterolemia 11/07/2014  . Other abnormalities of gait and mobility 11/02/2011  . Abnormal gait 06/29/2011  . Discoordination 06/29/2011   Pura Spice, PT, DPT # [phone] 08/18/2018, 10:32 AM  [facility] [address] [place], [place], [address] Phone: [phone]   Fax:  [phone]  Name: Torris House MRN: [id number] Date of Birth: 1941-04-30

## 2018-08-21 ENCOUNTER — Ambulatory Visit: Payer: Medicare Other

## 2018-08-21 ENCOUNTER — Ambulatory Visit
Admission: RE | Admit: 2018-08-21 | Discharge: 2018-08-21 | Disposition: A | Discharge status: Home / Self Care | Payer: Medicare Other | Source: Ambulatory Visit | Attending: Internal Medicine | Admitting: Internal Medicine

## 2018-08-21 DIAGNOSIS — I872 Venous insufficiency (chronic) (peripheral): Secondary | ICD-10-CM | POA: Diagnosis not present

## 2018-08-21 DIAGNOSIS — I82402 Acute embolism and thrombosis of unspecified deep veins of left lower extremity: Secondary | ICD-10-CM

## 2018-08-22 ENCOUNTER — Ambulatory Visit: Payer: Medicare Other | Admitting: Physical Therapy

## 2018-08-22 ENCOUNTER — Encounter: Payer: Self-pay | Admitting: Physical Therapy

## 2018-08-22 DIAGNOSIS — M6281 Muscle weakness (generalized): Secondary | ICD-10-CM | POA: Diagnosis not present

## 2018-08-22 DIAGNOSIS — R293 Abnormal posture: Secondary | ICD-10-CM

## 2018-08-22 DIAGNOSIS — R269 Unspecified abnormalities of gait and mobility: Secondary | ICD-10-CM

## 2018-08-22 NOTE — Therapy (Signed)
[facility] [address] [place], [place], [address] Phone: [phone]   Fax:  [phone]  Physical Therapy Treatment  Patient Details  Name: Mike Holloway MRN: [id number] Date of Birth: [birth year] Referring Provider (PT): Dr. Manson Passey   Encounter Date: 08/22/2018  PT End of Session - 08/22/18 1802    Visit Number  26    Number of Visits  32    Date for PT Re-Evaluation  09/06/18    PT Start Time  1640    PT Stop Time  1750    PT Time Calculation (min)  70 min    Equipment Utilized During Treatment  Gait belt    Activity Tolerance  Patient tolerated treatment well    Behavior During Therapy  [facility] for tasks assessed/performed       Past Medical History:  Diagnosis Date  . Arthritis   . Atrial flutter ([place])   . Diabetes mellitus ([place])   . Essential tremor   . Hypercholesteremia   . Hypertension   . Incontinence   . 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 Sleep apnea   . Stroke [facility])     Past Surgical History:  Procedure Laterality Date  . ABLATION    . APPENDECTOMY    . CATARACT EXTRACTION, BILATERAL    . COLONOSCOPY WITH PROPOFOL N/A 03/17/2018   Procedure: COLONOSCOPY WITH PROPOFOL;  Surgeon: Jonathon Bellows, MD;  Location: [facility] ENDOSCOPY;  Service: Gastroenterology;  Laterality: N/A;  . DEEP BRAIN STIMULATOR PLACEMENT    . 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 HEMORRHOID SURGERY    . HERNIA REPAIR    . NASAL SINUS SURGERY    . ORCHIECTOMY    . TONSILLECTOMY      There were no vitals filed for this visit.  Subjective Assessment - 08/22/18 1801    Subjective  Pt. reports no new complaints.  Pt. is scheduled for pain mgmt. with Dr. Holley Raring tomorrow morning for knee injection.      Patient is accompained by:  Family member    Limitations   Lifting;Standing;Walking;House hold activities    Patient Stated Goals  Improve LE strength/ gait and balance with daily tasks.  Return to using cane.      Currently in Pain?  No/denies          TREATMENT Neuromuscular Re-education  Step ups/downs on 6" step with light //-bar assist/ use of mirror for posture Standing step touches (heel or toe) with 5# ankle wts. On outside of //-bars (wt. Shifting/ no UE assist)- CGA from PT for safety due to 2 posterior LOB.   Step over 3" plinth in //-bars working on consistent hip flexion/ step pattern Walking in PT clinic with varying cadence and consistent BOS/ heel strike without use of loftstrand.   TherapeuticEx:  5# ankle wt. standing hip ex. (marching/ heel raises/ knee flexion)/ lateral and forward walking. Seated LAQ/ marching/ heel and toe raises 20x each.  Sit to stands from blue mat table (slight elevated >90 deg. Hip flexion) 10x. Reviewed HEP    MH applied at conclusion of therapy, 10 min, not billed     PT Long Term Goals - 08/13/18 1427      PT LONG TERM GOAL #2   Title  Pt. will increase Berg balance test to >45 out of 56  to improve independence with gait/ decrease fall risk.      Baseline  Berg: 39/56; 8/28 Berg: 43/56; 9/25 Berg: 44/56.  11/13: 46/56    Time  4    Period  Weeks    Status  Achieved    Target Date  09/06/18      PT LONG TERM GOAL #4   Title  Pt. able to stand from normal chair with no UE assist to improve safety/independence with transfers.     Baseline  multiple attempts/ benefits from 2" lift    Time  4    Period  Weeks    Status  Partially Met    Target Date  09/06/18      PT LONG TERM GOAL #5   Title  Pt. able to ambulate on even surface for 100 ft without AD requiring only SBA.    Baseline  Pt. requires CGA with noted LOB when ambulating without AD.    Time  4    Period  Weeks    Status  Partially Met    Target Date  09/06/18        Plan - 08/22/18 1802    Clinical  Impression Statement  Extra time for L hip/LE muscle control during braiding technique (L to R lateral walking).  Pt. had several posterior LOB with higher level step touching balance tasks on outside of //-bars.  Pt. required PT assist to prevent 2x but demonstrates the ability to self-correct with no UE assist.  Pt. ambulates with improved cadence/ gait pattern with SBA in PT clinic with no assistive device.  Good motivation and pt. very hard working during PT tx. today.  Few overall rest breaks required.      Clinical Presentation  Evolving    Clinical Decision Making  Moderate    Rehab Potential  Fair    PT Frequency  2x / week    PT Duration  4 weeks    PT Treatment/Interventions  ADLs/Self Care Home Management;Therapeutic activities;Functional mobility training;Stair training;Gait training;Therapeutic exercise;Balance training;Neuromuscular re-education;Patient/family education;Manual techniques;Passive range of motion    PT Next Visit Plan  Progress LE strengthening.    PT Home Exercise Plan  See handouts       Patient will benefit from skilled therapeutic intervention in order to improve the following deficits and impairments:  Abnormal gait, Decreased balance, Decreased endurance, Decreased mobility, Difficulty walking, Decreased range of motion, Decreased activity tolerance, Decreased strength, Impaired flexibility, Postural dysfunction, Pain  Visit Diagnosis: Muscle weakness (generalized)  Gait difficulty  Abnormal posture     Problem List Patient Active Problem List   Diagnosis Date Noted  . 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 HLD (hyperlipidemia) 03/24/2018  . Contusion of right knee 02/14/2018  . Bradycardia 12/28/2017  . 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 Chronic pain of right knee 08/10/2016  . Right ankle pain 08/10/2016  . Venous insufficiency of both lower extremities 05/13/2016  . 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[place])  12/11/2015  . 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 Lymphoma, 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[place]) 04/03/2015  . Breathlessness on exertion 11/21/2014  . Breath shortness 11/21/2014  . Arthropathy 11/07/2014  . Atrial flutter, paroxysmal ([place]) 11/07/2014  . Type 2 diabetes mellitus ([place]) 11/07/2014  . Benign essential tremor 11/07/2014  . Benign essential HTN 11/07/2014  . Mixed incontinence 11/07/2014  . Hypercholesterolemia without hypertriglyceridemia 11/07/2014  . Apnea, sleep 11/07/2014  . Controlled type 2 diabetes mellitus without complication ([place]) 70/17/7939  . Pure hypercholesterolemia 11/07/2014  .  Other abnormalities of gait and mobility 11/02/2011  . Abnormal gait 06/29/2011  . Discoordination 06/29/2011   Pura Spice, PT, DPT # [phone] 08/22/2018, 6:06 PM  [facility] [address] [place], [place], [address] Phone: [phone]   Fax:  [phone]  Name: Mike Holloway MRN: [id number] Date of Birth: [birth year]

## 2018-08-23 ENCOUNTER — Ambulatory Visit
Admission: RE | Admit: 2018-08-23 | Discharge: 2018-08-23 | Disposition: A | Discharge status: Home / Self Care | Payer: Medicare Other | Source: Ambulatory Visit | Attending: Student in an Organized Health Care Education/Training Program | Admitting: Student in an Organized Health Care Education/Training Program

## 2018-08-23 ENCOUNTER — Other Ambulatory Visit: Payer: Self-pay

## 2018-08-23 ENCOUNTER — Encounter: Payer: Self-pay | Admitting: Student in an Organized Health Care Education/Training Program

## 2018-08-23 ENCOUNTER — Ambulatory Visit (HOSPITAL_BASED_OUTPATIENT_CLINIC_OR_DEPARTMENT_OTHER): Payer: Medicare Other | Admitting: Student in an Organized Health Care Education/Training Program

## 2018-08-23 VITALS — BP 112/62 | HR 59 | Temp 98.1°F | Resp 22 | Ht 68.0 in | Wt 195.0 lb

## 2018-08-23 DIAGNOSIS — G8929 Other chronic pain: Secondary | ICD-10-CM | POA: Diagnosis present

## 2018-08-23 DIAGNOSIS — M25561 Pain in right knee: Secondary | ICD-10-CM | POA: Diagnosis present

## 2018-08-23 DIAGNOSIS — M1711 Unilateral primary osteoarthritis, right knee: Secondary | ICD-10-CM

## 2018-08-23 MED ORDER — DEXAMETHASONE SODIUM PHOSPHATE 10 MG/ML IJ SOLN
10.0000 mg | Freq: Once | INTRAMUSCULAR | Status: AC
  Administered 2018-08-23: 10 mg
  Filled 2018-08-23: qty 1

## 2018-08-23 MED ORDER — ROPIVACAINE HCL 2 MG/ML IJ SOLN
10.0000 mL | Freq: Once | INTRAMUSCULAR | Status: AC
  Administered 2018-08-23: 7 mL
  Filled 2018-08-23: qty 10

## 2018-08-23 MED ORDER — LIDOCAINE HCL 2 % IJ SOLN
20.0000 mL | Freq: Once | INTRAMUSCULAR | Status: AC
  Administered 2018-08-23: 400 mg
  Filled 2018-08-23: qty 40

## 2018-08-23 NOTE — Patient Instructions (Signed)

## 2018-08-23 NOTE — Progress Notes (Signed)
Safety precautions to be maintained throughout the outpatient stay will include: orient to surroundings, keep bed in low position, maintain call bell within reach at all times, provide assistance with transfer out of bed and ambulation.  

## 2018-08-23 NOTE — Progress Notes (Signed)
Patient's Name: Mike Holloway  MRN: [id number]  Referring Provider: [name], MD  DOB: 08-21-1941  PCP: [name], MD  DOS: 08/23/2018  Note by: Gillis Santa, MD  Service setting: Ambulatory outpatient  Specialty: Interventional Pain Management  Patient type: Established  Location: [facility] (AMB) Pain Management Facility  Visit type: Interventional Procedure   Primary Reason for Visit: Interventional Pain Management Treatment. CC: Knee Pain (right)  Procedure:          Anesthesia, Analgesia, Anxiolysis:  Type: Genicular Nerves Block (Superior-lateral, Superior-medial, and Inferior-medial Genicular Nerves) #2  CPT: 23762      Primary Purpose: Diagnostic Region: Lateral, Anterior, and Medial aspects of the knee joint, above and below the knee joint proper. Level: Superior and inferior to the knee joint. Target Area: For Genicular Nerve block(s), the targets are: the superior-lateral genicular nerve, located in the lateral distal portion of the femoral shaft as it curves to form the lateral epicondyle, in the region of the distal femoral metaphysis; the superior-medial genicular nerve, located in the medial distal portion of the femoral shaft as it curves to form the medial epicondyle; and the inferior-medial genicular nerve, located in the medial, proximal portion of the tibial shaft, as it curves to form the medial epicondyle, in the region of the proximal tibial metaphysis. Approach: Anterior, percutaneous, ipsilateral approach. Laterality: Right knee  Type: Local Anesthesia Indication(s): Analgesia         Route: Infiltration (/IM) IV Access: Declined Sedation: Declined  Local Anesthetic: Lidocaine 1-2%  Position: Modified Fowler's position with pillows under the targeted knee(s).   Indications: 1. Primary osteoarthritis of right knee   2. Chronic pain of right knee    Pain Score: Pre-procedure: 1 /10 Post-procedure: 0-No pain/10  Pre-op Assessment:  Mr.  Holloway is a 77 y.o. (year old), male patient, seen today for interventional treatment. 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 Mike Holloway has a current medication list which includes the following prescription(s): cholecalciferol, famotidine, fluoxetine, mirabegron er, montelukast, NON FORMULARY, primidone, propranolol er, tamsulosin, triamcinolone acetonide, triamterene-hydrochlorothiazide, diclofenac sodium, neomycin-polymyxin b-dexamethasone, and probiotic product. His primarily concern today is the Knee Pain (right)  Initial Vital Signs:  Pulse/HCG Rate: (!) 59ECG Heart Rate: (!) 56 Temp: 98.1 F (36.7 C) Resp: 18 BP: 114/70 SpO2: 96 %  BMI: Estimated body mass index is 29.65 kg/m as calculated from the following:   Height as of this encounter: 5\' 8"  (1.727 m).   Weight as of this encounter: 195 lb (88.5 kg).  Risk Assessment: Allergies: Reviewed. He is allergic to clindamycin; flagyl [metronidazole]; ambien  [zolpidem]; penicillins; and sulfa antibiotics.  Allergy Precautions: None required Coagulopathies: Reviewed. None identified.  Blood-thinner therapy: None at this time Active Infection(s): Reviewed. None identified. Mike Holloway is afebrile  Site Confirmation: Mike Holloway was asked to confirm the procedure and laterality before marking the site Procedure checklist: Completed Consent: Before the procedure and under the influence of no sedative(s), amnesic(s), or anxiolytics, the patient was informed of the treatment options, risks and possible complications. To fulfill our ethical and legal obligations, as recommended by the American Medical Association's Code of Ethics, I have informed the patient of my clinical impression; the  nature and purpose of the treatment or procedure; the risks, benefits, and possible complications of the intervention; the alternatives, including  doing nothing; the risk(s) and benefit(s) of the alternative treatment(s) or procedure(s); and the risk(s) and benefit(s) of doing nothing. The patient was provided information about the general risks and possible complications associated with the procedure. These may include, but are not limited to: failure to achieve desired goals, infection, bleeding, organ or nerve damage, allergic reactions, paralysis, and death. In addition, the patient was informed of those risks and complications associated to the procedure, such as failure to decrease pain; infection; bleeding; organ or nerve damage with subsequent damage to sensory, motor, and/or autonomic systems, resulting in permanent pain, numbness, and/or weakness of one or several areas of the body; allergic reactions; (i.e.: anaphylactic reaction); and/or death. Furthermore, the patient was informed of those risks and complications associated with the medications. These include, but are not limited to: allergic reactions (i.e.: anaphylactic or anaphylactoid reaction(s)); adrenal axis suppression; blood sugar elevation that in diabetics may result in ketoacidosis or comma; water retention that in patients with history of congestive heart failure may result in shortness of breath, pulmonary edema, and decompensation with resultant heart failure; weight gain; swelling or edema; medication-induced neural toxicity; particulate matter embolism and blood vessel occlusion with resultant organ, and/or nervous system infarction; and/or aseptic necrosis of one or more joints. Finally, the patient was informed that Medicine is not an exact science; therefore, there is also the possibility of unforeseen or unpredictable risks and/or possible complications that may result in a catastrophic outcome. The patient indicated having  understood very clearly. We have given the patient no guarantees and we have made no promises. Enough time was given to the patient to ask questions, all of which were answered to the patient's satisfaction. Mr. Favila has indicated that he wanted to continue with the procedure. Attestation: I, the ordering provider, attest that I have discussed with the patient the benefits, risks, side-effects, alternatives, likelihood of achieving goals, and potential problems during recovery for the procedure that I have provided informed consent. Date  Time: 08/23/2018  9:37 AM  Pre-Procedure Preparation:  Monitoring: As per clinic protocol. Respiration, ETCO2, SpO2, BP, heart rate and rhythm monitor placed and checked for adequate function Safety Precautions: Patient was assessed for positional comfort and pressure points before starting the procedure. Time-out: I initiated and conducted the "Time-out" before starting the procedure, as per protocol. The patient was asked to participate by confirming the accuracy of the "Time Out" information. Verification of the correct person, site, and procedure were performed and confirmed by me, the nursing staff, and the patient. "Time-out" conducted as per Joint Commission's Universal Protocol (UP.01.01.01). Time: 1057  Description of Procedure:          Area Prepped: Entire knee area, from mid-thigh to mid-shin, lateral, anterior, and medial aspects. Prepping solution: ChloraPrep (2% chlorhexidine gluconate and 70% isopropyl alcohol) Safety Precautions: Aspiration looking for blood return was conducted prior to all injections. At no point did we inject any substances, as a needle was being advanced. No attempts were made at seeking any paresthesias. Safe injection practices and needle disposal techniques used. Medications properly checked for expiration dates. SDV (single dose vial) medications used. Latex Allergy precautions taken.   Description of the Procedure:  Protocol guidelines were followed. The patient was placed in position over the procedure table. The target area was identified and the area prepped in the usual manner. Skin & deeper tissues infiltrated with local anesthetic. Appropriate amount of time allowed to pass for local anesthetics to take effect. The procedure needles were then advanced  to the target area. Proper needle placement secured. Negative aspiration confirmed. Solution injected in intermittent fashion, asking for systemic symptoms every 0.5cc of injectate. The needles were then removed and the area cleansed, making sure to leave some of the prepping solution back to take advantage of its long term bactericidal properties.  Vitals:   08/23/18 1011 08/23/18 1054 08/23/18 1058 08/23/18 1100  BP: 114/70 123/70 115/73 112/62  Pulse: (!) 59     Resp: 18 (!) 9 19 (!) 22  Temp: 98.1 F (36.7 C)     TempSrc: Oral     SpO2: 96% 97% 98% 99%  Weight: 195 lb (88.5 kg)     Height: 5\' 8"  (1.727 m)       Start Time: 1057 hrs. End Time: 1103 hrs. Materials:  Needle(s) Type: Spinal Needle Gauge: 25G Length: 3.5-in Medication(s): Please see orders for medications and dosing details. 6 cc solution made of 5 cc of 0.2% ropivacaine, 1 cc of Decadron 10 mg/cc.  2cc injected at each level above. Imaging Guidance (Non-Spinal):          Type of Imaging Technique: Fluoroscopy Guidance (Non-Spinal) Indication(s): Assistance in needle guidance and placement for procedures requiring needle placement in or near specific anatomical locations not easily accessible without such assistance. Exposure Time: Please see nurses notes. Contrast: Before injecting any contrast, we confirmed that the patient did not have an allergy to iodine, shellfish, or radiological contrast. Once satisfactory needle placement was completed at the desired level, radiological contrast was injected. Contrast injected under live fluoroscopy. No contrast complications. See chart for  type and volume of contrast used. Fluoroscopic Guidance: I was personally present during the use of fluoroscopy. "Tunnel Vision Technique" used to obtain the best possible view of the target area. Parallax error corrected before commencing the procedure. "Direction-depth-direction" technique used to introduce the needle under continuous pulsed fluoroscopy. Once target was reached, antero-posterior, oblique, and lateral fluoroscopic projection used confirm needle placement in all planes. Images permanently stored in EMR. Interpretation: I personally interpreted the imaging intraoperatively. Adequate needle placement confirmed in multiple planes. Appropriate spread of contrast into desired area was observed. No evidence of afferent or efferent intravascular uptake. Permanent images saved into the patient's record.  Antibiotic Prophylaxis:   Anti-infectives (From admission, onward)   None     Indication(s): None identified  Post-operative Assessment:  Post-procedure Vital Signs:  Pulse/HCG Rate: (!) 5963 Temp: 98.1 F (36.7 C) Resp: (!) 22 BP: 112/62 SpO2: 99 %  EBL: None  Complications: No immediate post-treatment complications observed by team, or reported by patient.  Note: The patient tolerated the entire procedure well. A repeat set of vitals were taken after the procedure and the patient was kept under observation following institutional policy, for this type of procedure. Post-procedural neurological assessment was performed, showing return to baseline, prior to discharge. The patient was provided with post-procedure discharge instructions, including a section on how to identify potential problems. Should any problems arise concerning this procedure, the patient was given instructions to immediately contact us, at any time, without hesitation. In any case, we plan to contact the patient by telephone for a follow-up status report regarding this interventional procedure.  Comments:  No  additional relevant information.  Plan of Care    Imaging Orders     DG C-Arm 1-60 Min-No Report Procedure Orders    No procedure(s) ordered today    Medications ordered for procedure: Meds ordered this encounter  Medications  . ropivacaine (PF) 2 mg/mL (  0.2%) (NAROPIN) injection 10 mL  . lidocaine (XYLOCAINE) 2 % (with pres) injection 400 mg  . dexamethasone (DECADRON) injection 10 mg   Medications administered: We administered ropivacaine (PF) 2 mg/mL (0.2%), lidocaine, and dexamethasone.  See the medical record for exact dosing, route, and time of administration.  Disposition: Discharge home  Discharge Date & Time: 08/23/2018; 1109 hrs.   Physician-requested Follow-up: Return in about 6 weeks (around 10/05/2018) for Post Procedure Evaluation.  Future Appointments  Date Time Provider [place]  08/29/2018  1:00 PM Pura Spice, PT [facility] None  09/05/2018  2:30 PM Pura Spice, PT [facility] None  09/14/2018 10:45 AM [facility] None  10/03/2018 11:45 AM Gillis Santa, MD [facility] None   Primary Care Physician: [name], MD Location: [facility] Note by: Gillis Santa, MD Date: 08/23/2018; Time: 12:52 PM  Disclaimer:  Medicine is not an exact science. The only guarantee in medicine is that nothing is guaranteed. It is important to note that the decision to proceed with this intervention was based on the information collected from the patient. The Data and conclusions were drawn from the patient's questionnaire, the interview, and the physical examination. Because the information was provided in large part by the patient, it cannot be guaranteed that it has not been purposely or unconsciously manipulated. Every effort has been made to obtain as much relevant data as possible for this evaluation. It is important to note that the conclusions that lead to this procedure are derived in large part from the  available data. Always take into account that the treatment will also be dependent on availability of resources and existing treatment guidelines, considered by other Pain Management Practitioners as being common knowledge and practice, at the time of the intervention. For Medico-Legal purposes, it is also important to point out that variation in procedural techniques and pharmacological choices are the acceptable norm. The indications, contraindications, technique, and results of the above procedure should only be interpreted and judged by a Board-Certified Interventional Pain Specialist with extensive familiarity and expertise in the same exact procedure and technique.

## 2018-08-29 ENCOUNTER — Ambulatory Visit: Payer: Medicare Other | Admitting: Physical Therapy

## 2018-09-01 ENCOUNTER — Ambulatory Visit: Payer: Medicare Other | Attending: Neurosurgery | Admitting: Physical Therapy

## 2018-09-01 DIAGNOSIS — R293 Abnormal posture: Secondary | ICD-10-CM | POA: Insufficient documentation

## 2018-09-01 DIAGNOSIS — M6281 Muscle weakness (generalized): Secondary | ICD-10-CM

## 2018-09-01 DIAGNOSIS — R269 Unspecified abnormalities of gait and mobility: Secondary | ICD-10-CM | POA: Diagnosis present

## 2018-09-02 NOTE — Therapy (Signed)
[facility] [address] [place], [place], [address] Phone: [phone]   Fax:  [phone]  Physical Therapy Treatment  Patient Details  Name: Mike Holloway MRN: [id number] Date of Birth: 06-06-1941 Referring Provider (PT): Dr. Manson Passey   Encounter Date: 09/01/2018  PT End of Session - 09/02/18 1302    Visit Number  27    Number of Visits  32    Date for PT Re-Evaluation  09/06/18    PT Start Time  0943    PT Stop Time  1039    PT Time Calculation (min)  56 min    Equipment Utilized During Treatment  Gait belt    Activity Tolerance  Patient tolerated treatment well    Behavior During Therapy  [facility] for tasks assessed/performed       Past Medical History:  Diagnosis Date  . Arthritis   . Atrial flutter ([place])   . Diabetes mellitus ([place])   . Essential tremor   . Hypercholesteremia   . Hypertension   . Incontinence   . 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[place])    grew on the testical  . Sleep apnea   . Stroke [facility])     Past Surgical History:  Procedure Laterality Date  . ABLATION    . APPENDECTOMY    . CATARACT EXTRACTION, BILATERAL    . COLONOSCOPY WITH PROPOFOL N/A 03/17/2018   Procedure: COLONOSCOPY WITH PROPOFOL;  Surgeon: Jonathon Bellows, MD;  Location: [facility] ENDOSCOPY;  Service: Gastroenterology;  Laterality: N/A;  . DEEP BRAIN STIMULATOR PLACEMENT    . 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 HEMORRHOID SURGERY    . HERNIA REPAIR    . NASAL SINUS SURGERY    . ORCHIECTOMY    . TONSILLECTOMY      There were no vitals filed for this visit.  Subjective Assessment - 09/02/18 1259    Subjective  Pt. states he received Botox in B eyelids and is doing well.  Pt. reports no new issues.  R knee discomfort remains present with increase activity.      Patient is accompained by:  Family member    Limitations  Lifting;Standing;Walking;House hold activities    Patient Stated Goals  Improve LE strength/ gait and balance with daily tasks.  Return to using cane.      Currently in Pain?  No/denies         TREATMENT Neuromuscular Re-education  Standing step touches (heel or toe) with 5# ankle wts. On outside of //-bars (wt. Shifting/ no UE assist).   Step over 3" plinth in //-bars working on consistent hip flexion/ step pattern Standing cone taps (lateral) with no ankle wts. L/R 10x each (outside of //-bars) Walking in PT clinic with/without 5# ankle wts. Working on consistent BOS/ heel strike without use of loftstrand. Turning in clinic/ //-bars with no UE assist.   TherapeuticEx:  5# ankle wt. standing hip ex. (marching/ heel raises/ knee flexion)/ lateral and forward walking. Seated LAQ/ marching/ heel and toe raises 20x each. Supine hamstring stretches/ SLR/ bridging/ hip abduction Reviewed HEP   MH applied at conclusion of therapy, 10 min, not billed    PT Long Term Goals - 08/13/18 1427      PT LONG TERM GOAL #2   Title  Pt. will increase Berg balance test to >45 out of 56 to improve independence with gait/  decrease fall risk.      Baseline  Berg: 39/56; 8/28 Berg: 43/56; 9/25 Berg: 44/56.  11/13: 46/56    Time  4    Period  Weeks    Status  Achieved    Target Date  09/06/18      PT LONG TERM GOAL #4   Title  Pt. able to stand from normal chair with no UE assist to improve safety/independence with transfers.     Baseline  multiple attempts/ benefits from 2" lift    Time  4    Period  Weeks    Status  Partially Met    Target Date  09/06/18      PT LONG TERM GOAL #5   Title  Pt. able to ambulate on even surface for 100 ft without AD requiring only SBA.    Baseline  Pt. requires CGA with noted LOB when ambulating without AD.    Time  4    Period  Weeks    Status  Partially Met    Target Date  09/06/18            Plan - 09/02/18 1305     Clinical Impression Statement  Pt. works hard during PT tx. session with resisted ther.ex./ balance tasks.  Pt. had 1 LOB with step touches without use of UE.  Pt. ambulates with a consistent step pattern/ stride length and heel strike with use of loftstrand crutches.  Pt. able to ambulate short distances with no assistive device but less consistent step pattern/ BOS.  No increase c/o knee pain but pt. request heat to knee after tx. session.      Clinical Presentation  Evolving    Clinical Decision Making  Moderate    Rehab Potential  Fair    PT Frequency  2x / week    PT Duration  4 weeks    PT Treatment/Interventions  ADLs/Self Care Home Management;Therapeutic activities;Functional mobility training;Stair training;Gait training;Therapeutic exercise;Balance training;Neuromuscular re-education;Patient/family education;Manual techniques;Passive range of motion    PT Next Visit Plan  Progress LE strengthening.    PT Home Exercise Plan  See handouts       Patient will benefit from skilled therapeutic intervention in order to improve the following deficits and impairments:  Abnormal gait, Decreased balance, Decreased endurance, Decreased mobility, Difficulty walking, Decreased range of motion, Decreased activity tolerance, Decreased strength, Impaired flexibility, Postural dysfunction, Pain  Visit Diagnosis: Muscle weakness (generalized)  Gait difficulty  Abnormal posture     Problem List Patient Active Problem List   Diagnosis Date Noted  . 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 HLD (hyperlipidemia) 03/24/2018  . Contusion of right knee 02/14/2018  . Bradycardia 12/28/2017  . 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 Chronic pain of right knee 08/10/2016  . Right ankle pain 08/10/2016  . Venous insufficiency of both lower extremities 05/13/2016  . 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[place]) 12/11/2015  . 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 Lymphoma,  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[place]) 04/03/2015  . Breathlessness on exertion 11/21/2014  . Breath shortness 11/21/2014  . Arthropathy 11/07/2014  . Atrial flutter, paroxysmal ([place]) 11/07/2014  . Type 2 diabetes mellitus ([place]) 11/07/2014  . Benign essential tremor 11/07/2014  . Benign essential HTN 11/07/2014  . Mixed incontinence 11/07/2014  . Hypercholesterolemia without hypertriglyceridemia 11/07/2014  . Apnea, sleep 11/07/2014  . Controlled type 2 diabetes mellitus without complication ([place]) 08/65/7846  . Pure hypercholesterolemia 11/07/2014  . Other abnormalities of gait and mobility 11/02/2011  . Abnormal gait  06/29/2011  . Discoordination 06/29/2011   Pura Spice, PT, DPT # [id number] 09/02/2018, 1:12 PM  [facility] [address] [place], [place], [address] Phone: [phone]   Fax:  [phone]  Name: Mike Holloway MRN: [id number] Date of Birth: 1940-11-23

## 2018-09-05 ENCOUNTER — Ambulatory Visit: Payer: Medicare Other | Admitting: Physical Therapy

## 2018-09-05 ENCOUNTER — Encounter: Payer: Self-pay | Admitting: Physical Therapy

## 2018-09-05 DIAGNOSIS — M6281 Muscle weakness (generalized): Secondary | ICD-10-CM | POA: Diagnosis not present

## 2018-09-05 DIAGNOSIS — R293 Abnormal posture: Secondary | ICD-10-CM

## 2018-09-05 DIAGNOSIS — R269 Unspecified abnormalities of gait and mobility: Secondary | ICD-10-CM

## 2018-09-10 NOTE — Therapy (Signed)
[facility] [address] [place], [place], [address] Phone: [phone]   Fax:  [phone]  Physical Therapy Treatment  Patient Details  Name: Mike Holloway MRN: [id number] Date of Birth: 10-01-1940 Referring Provider (PT): Dr. Manson Passey   Encounter Date: 09/05/2018  PT End of Session - 09/10/18 1709    Visit Number  28    Number of Visits  32    Date for PT Re-Evaluation  09/06/18    PT Start Time  6269    PT Stop Time  1525    PT Time Calculation (min)  57 min    Equipment Utilized During Treatment  Gait belt    Activity Tolerance  Treatment limited secondary to medical complications (Comment)    Behavior During Therapy  [facility] for tasks assessed/performed       Past Medical History:  Diagnosis Date  . Arthritis   . Atrial flutter ([place])   . Diabetes mellitus ([place])   . Essential tremor   . Hypercholesteremia   . Hypertension   . Incontinence   . 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 Sleep apnea   . Stroke [facility])     Past Surgical History:  Procedure Laterality Date  . ABLATION    . APPENDECTOMY    . CATARACT EXTRACTION, BILATERAL    . COLONOSCOPY WITH PROPOFOL N/A 03/17/2018   Procedure: COLONOSCOPY WITH PROPOFOL;  Surgeon: Jonathon Bellows, MD;  Location: [facility] ENDOSCOPY;  Service: Gastroenterology;  Laterality: N/A;  . DEEP BRAIN STIMULATOR PLACEMENT    . 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 HEMORRHOID SURGERY    . HERNIA REPAIR    . NASAL SINUS SURGERY    . ORCHIECTOMY    . TONSILLECTOMY      There were no vitals filed for this visit.  Subjective Assessment - 09/10/18 1702    Subjective  Pt. states he is having eye issues again today.  Pt. states the Botox did not help much.      Patient is accompained by:  Family member    Limitations   Lifting;Standing;Walking;House hold activities    Patient Stated Goals  Improve LE strength/ gait and balance with daily tasks.  Return to using cane.      Currently in Pain?  No/denies       TREATMENT Neuromuscular Re-education  Walking in PT clinic and //-bars.  Working on consistent BOS/ heel strike without use of loftstrand. Pt. Limited by vision today and requires CGA for safety.  Standing step touches L/R.    TherapeuticEx:  Seated LAQ/ marching/ heel and toe raises 20x each. Supine hamstring/ hip stretches 9 min/ SLR/ bridging/ hip abduction with GTB 20x each Reviewed HEP   MH applied at conclusion of therapy, 10 min, not billed    PT Long Term Goals - 08/13/18 1427      PT LONG TERM GOAL #2   Title  Pt. will increase Berg balance test to >45 out of 56 to improve independence with gait/ decrease fall risk.      Baseline  Berg: 39/56; 8/28 Berg: 43/56; 9/25 Berg: 44/56.  11/13: 46/56    Time  4    Period  Weeks    Status  Achieved    Target Date  09/06/18      PT LONG TERM GOAL #4  Title  Pt. able to stand from normal chair with no UE assist to improve safety/independence with transfers.     Baseline  multiple attempts/ benefits from 2" lift    Time  4    Period  Weeks    Status  Partially Met    Target Date  09/06/18      PT LONG TERM GOAL #5   Title  Pt. able to ambulate on even surface for 100 ft without AD requiring only SBA.    Baseline  Pt. requires CGA with noted LOB when ambulating without AD.    Time  4    Period  Weeks    Status  Partially Met    Target Date  09/06/18            Plan - 09/10/18 1710    Clinical Impression Statement  Pts. balance/ gait in clinic limited by vision issues.  Pt. had to keep eyes closed/ blink a lot durin tx. session secondary to eye discomfort.  Pt. used rewetting drops with minimal benefit.  PT focused more on LE strengtheing in supine position due to eye issues.  No progression with balance/ standing  ther.ex. today.      Clinical Presentation  Evolving    Clinical Decision Making  Moderate    Rehab Potential  Fair    PT Frequency  2x / week    PT Duration  4 weeks    PT Treatment/Interventions  ADLs/Self Care Home Management;Therapeutic activities;Functional mobility training;Stair training;Gait training;Therapeutic exercise;Balance training;Neuromuscular re-education;Patient/family education;Manual techniques;Passive range of motion    PT Next Visit Plan  Progress LE strengthening.  RECERT next tx. session.     PT Home Exercise Plan  See handouts       Patient will benefit from skilled therapeutic intervention in order to improve the following deficits and impairments:  Abnormal gait, Decreased balance, Decreased endurance, Decreased mobility, Difficulty walking, Decreased range of motion, Decreased activity tolerance, Decreased strength, Impaired flexibility, Postural dysfunction, Pain  Visit Diagnosis: Muscle weakness (generalized)  Gait difficulty  Abnormal posture     Problem List Patient Active Problem List   Diagnosis Date Noted  . 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 HLD (hyperlipidemia) 03/24/2018  . Contusion of right knee 02/14/2018  . Bradycardia 12/28/2017  . 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 Chronic pain of right knee 08/10/2016  . Right ankle pain 08/10/2016  . Venous insufficiency of both lower extremities 05/13/2016  . 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[place]) 12/11/2015  . 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 Lymphoma, 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[place]) 04/03/2015  . Breathlessness on exertion 11/21/2014  . Breath shortness 11/21/2014  . Arthropathy 11/07/2014  . Atrial flutter, paroxysmal ([place]) 11/07/2014  . Type 2 diabetes mellitus ([place]) 11/07/2014  . Benign essential tremor 11/07/2014  . Benign essential HTN 11/07/2014  . Mixed incontinence 11/07/2014  . Hypercholesterolemia without hypertriglyceridemia 11/07/2014  . Apnea,  sleep 11/07/2014  . Controlled type 2 diabetes mellitus without complication ([place]) 40/06/2724  . Pure hypercholesterolemia 11/07/2014  . Other abnormalities of gait and mobility 11/02/2011  . Abnormal gait 06/29/2011  . Discoordination 06/29/2011   Pura Spice, PT, DPT # [id number] 09/10/2018, 5:22 PM  [facility] [address] [place], [place], [address] Phone: [phone]   Fax:  [phone]  Name: Mike Holloway MRN: [id number] Date of Birth: 18-Nov-1940

## 2018-09-12 ENCOUNTER — Ambulatory Visit (INDEPENDENT_AMBULATORY_CARE_PROVIDER_SITE_OTHER): Payer: Medicare Other | Admitting: Urology

## 2018-09-12 ENCOUNTER — Encounter: Payer: Self-pay | Admitting: Urology

## 2018-09-12 VITALS — BP 103/62 | HR 69 | Ht 68.75 in | Wt 197.0 lb

## 2018-09-12 DIAGNOSIS — R399 Unspecified symptoms and signs involving the genitourinary system: Secondary | ICD-10-CM

## 2018-09-12 DIAGNOSIS — N181 Chronic kidney disease, stage 1: Secondary | ICD-10-CM

## 2018-09-12 NOTE — Progress Notes (Signed)
09/12/2018 12:58 PM   Mike Holloway [birth year] [id number]  Referring provider: [name], MD [place], [place] [address]  Chief Complaint  Patient presents with  . Follow-up  . Over Active Bladder   Urologic history: 1.  BPH with lower urinary tract symptoms  -Tamsulosin and Myrbetriq  2.  History B-cell lymphoma right testis   -status post orchiectomy/chemoradiation abdomen/pelvis  HPI: 77 year old male presents for annual follow-up of lower urinary tract symptoms.   -   Last saw Dr. Pilar Jarvis December 2018 and was doing well on Myrbetriq and tamsulosin  -   Since his last visit he has had worsening urinary hesitancy, urgency with urge incontinence.  -   Denies dysuria, gross hematuria or flank/abdominal/pelvic pain   PMH: Past Medical History:  Diagnosis Date  . Arthritis   . Atrial flutter ([place])   . Diabetes mellitus ([place])   . Essential tremor   . Hypercholesteremia   . Hypertension   . Incontinence   . 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 Sleep apnea   . Stroke [facility])     Surgical History: Past Surgical History:  Procedure Laterality Date  . ABLATION    . APPENDECTOMY    . CATARACT EXTRACTION, BILATERAL    . COLONOSCOPY WITH PROPOFOL N/A 03/17/2018   Procedure: COLONOSCOPY WITH PROPOFOL;  Surgeon: Jonathon Bellows, MD;  Location: [facility] ENDOSCOPY;  Service: Gastroenterology;  Laterality: N/A;  . DEEP BRAIN STIMULATOR PLACEMENT    . 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 HEMORRHOID SURGERY    . HERNIA REPAIR    . NASAL SINUS SURGERY    . ORCHIECTOMY    . TONSILLECTOMY      Home Medications:  Allergies as of 09/12/2018      Reactions   Clindamycin Diarrhea   Contracted C. Diff x 2   Flagyl [metronidazole] Swelling   Swollen tongue, excessive shaking,     Ambien  [zolpidem]    Other  reaction(s): Other (See Comments) Disoriented and moody Other reaction(s): Other (See Comments) Disoriented and moody   Penicillins Rash   Has patient had a PCN reaction causing immediate rash, facial/tongue/throat swelling, SOB or lightheadedness with hypotension: Unknown Has patient had a PCN reaction causing severe rash involving mucus membranes or skin necrosis: Unknown Has patient had a PCN reaction that required hospitalization: No Has patient had a PCN reaction occurring within the last 10 years: No If all of the above answers are "NO", then may proceed with Cephalosporin use.   Sulfa Antibiotics Rash      Medication List       Accurate as of September 12, 2018 11:59 PM. Always use your most recent med list.        diclofenac sodium 1 % Gel Commonly known as:  VOLTAREN Apply 2 g topically 2 (two) times daily as needed (pain).   famotidine 20 MG tablet Commonly known as:  PEPCID TK 1 T PO BID   FLUoxetine 10 MG tablet Commonly known as:  PROZAC Take 10 mg by mouth daily.   mirabegron ER 50 MG Tb24 tablet Commonly known as:  MYRBETRIQ Take 1 tablet (50 mg total) by mouth daily.   montelukast 10 MG tablet Commonly known as:  SINGULAIR Take 10 mg by mouth at bedtime.   NASACORT AQ NA Place 2 sprays into the  nose daily.   neomycin-polymyxin b-dexamethasone 3.5-10000-0.1 Susp Commonly known as:  MAXITROL SHAKE LQ AND INT 1 GTT IN OU TID FOR 14 DAYS   NON FORMULARY Real time pain relief   primidone 50 MG tablet Commonly known as:  MYSOLINE Take 50-100 mg by mouth 2 (two) times daily. 100 mg in the morning and 50 mg at bedtime   propranolol ER 80 MG 24 hr capsule Commonly known as:  INDERAL LA Take 160 mg by mouth daily.   tamsulosin 0.4 MG Caps capsule Commonly known as:  FLOMAX Take 0.4 mg by mouth daily.   triamterene-hydrochlorothiazide 37.5-25 MG tablet Commonly known as:  MAXZIDE-25 TK 1 T PO QD   VITAMIN D-1000 MAX ST 25 MCG (1000 UT)  tablet Generic drug:  Cholecalciferol Take 1,000 Units by mouth at bedtime.   VSL#3 PO Take 1 capsule by mouth 2 (two) times daily.       Allergies:  Allergies  Allergen Reactions  . Clindamycin Diarrhea    Contracted C. Diff x 2  . Flagyl [Metronidazole] Swelling    Swollen tongue, excessive shaking,    . Ambien  [Zolpidem]     Other reaction(s): Other (See Comments) Disoriented and moody Other reaction(s): Other (See Comments) Disoriented and moody  . Penicillins Rash    Has patient had a PCN reaction causing immediate rash, facial/tongue/throat swelling, SOB or lightheadedness with hypotension: Unknown Has patient had a PCN reaction causing severe rash involving mucus membranes or skin necrosis: Unknown Has patient had a PCN reaction that required hospitalization: No Has patient had a PCN reaction occurring within the last 10 years: No If all of the above answers are "NO", then may proceed with Cephalosporin use.  . Sulfa Antibiotics Rash    Family History: Family History  Problem Relation Age of Onset  . Hematuria Father   . Prostate cancer Father   . Heart disease Mother   . Kidney disease Neg Hx   . Bladder Cancer Neg Hx     Social History:  reports that he has never smoked. He has never used smokeless tobacco. He reports that he does not drink alcohol or use drugs.  ROS: UROLOGY Frequent Urination?: Yes Hard to postpone urination?: Yes Burning/pain with urination?: No Get up at night to urinate?: No Leakage of urine?: Yes Urine stream starts and stops?: Yes Trouble starting stream?: Yes Do you have to strain to urinate?: Yes Blood in urine?: No Urinary tract infection?: No Sexually transmitted disease?: No Injury to kidneys or bladder?: No Painful intercourse?: No Weak stream?: Yes Erection problems?: Yes Penile pain?: No  Gastrointestinal Nausea?: No Vomiting?: No Indigestion/heartburn?: Yes Diarrhea?: No Constipation?:  No  Constitutional Fever: No Night sweats?: No Weight loss?: No Fatigue?: Yes  Skin Skin rash/lesions?: No Itching?: Yes  Eyes Blurred vision?: No Double vision?: No  Ears/Nose/Throat Sore throat?: No Sinus problems?: Yes  Hematologic/Lymphatic Swollen glands?: No Easy bruising?: No  Cardiovascular Leg swelling?: Yes Chest pain?: No  Respiratory Cough?: Yes Shortness of breath?: No  Endocrine Excessive thirst?: No  Musculoskeletal Back pain?: No Joint pain?: Yes  Neurological Headaches?: No Dizziness?: No  Psychologic Depression?: No Anxiety?: No  Physical Exam: BP 103/62 (BP Location: Left Arm, Patient Position: Sitting, Cuff Size: Large)   Pulse 69   Ht 5' 8.75" (1.746 m)   Wt 197 lb (89.4 kg)   BMI 29.30 kg/m   Constitutional:  Alert and oriented, No acute distress. HEENT: [place] AT, moist mucus membranes.  Trachea midline,  no masses. Cardiovascular: No clubbing, cyanosis, or edema. Respiratory: Normal respiratory effort, no increased work of breathing.   Assessment & Plan:     -   77 year old male with worsening lower urinary tract symptoms on tamsulosin and Myrbetriq.  His most bothersome symptoms are storage related   -   Intolerant of oxybutynin secondary to confusion.  Discussed adding a different anticholinergic such as trospium however he does not desire additional medication at this time   -   Urodynamic study was discussed however he does not desire to pursue at present   -   Schedule screening renal ultrasound   Return in about 1 year (around 09/13/2019) for Recheck.   Abbie Sons, [place] [address] [place], [place] [address] [phone]

## 2018-09-13 ENCOUNTER — Encounter: Payer: Self-pay | Admitting: Urology

## 2018-09-14 ENCOUNTER — Ambulatory Visit: Payer: Medicare Other

## 2018-09-14 ENCOUNTER — Ambulatory Visit: Payer: Medicare Other | Admitting: Physical Therapy

## 2018-09-14 ENCOUNTER — Encounter: Payer: Self-pay | Admitting: Urology

## 2018-09-14 ENCOUNTER — Encounter: Payer: Self-pay | Admitting: Physical Therapy

## 2018-09-14 DIAGNOSIS — Z6829 Body mass index (BMI) 29.0-29.9, adult: Secondary | ICD-10-CM

## 2018-09-14 DIAGNOSIS — S72141A Displaced intertrochanteric fracture of right femur, initial encounter for closed fracture: Secondary | ICD-10-CM | POA: Diagnosis not present

## 2018-09-14 DIAGNOSIS — N401 Enlarged prostate with lower urinary tract symptoms: Secondary | ICD-10-CM | POA: Diagnosis present

## 2018-09-14 DIAGNOSIS — R32 Unspecified urinary incontinence: Secondary | ICD-10-CM | POA: Diagnosis present

## 2018-09-14 DIAGNOSIS — Z9221 Personal history of antineoplastic chemotherapy: Secondary | ICD-10-CM

## 2018-09-14 DIAGNOSIS — W010XXA Fall on same level from slipping, tripping and stumbling without subsequent striking against object, initial encounter: Secondary | ICD-10-CM | POA: Diagnosis present

## 2018-09-14 DIAGNOSIS — M1610 Unilateral primary osteoarthritis, unspecified hip: Secondary | ICD-10-CM | POA: Diagnosis present

## 2018-09-14 DIAGNOSIS — R293 Abnormal posture: Secondary | ICD-10-CM

## 2018-09-14 DIAGNOSIS — Z8673 Personal history of transient ischemic attack (TIA), and cerebral infarction without residual deficits: Secondary | ICD-10-CM

## 2018-09-14 DIAGNOSIS — Z96659 Presence of unspecified artificial knee joint: Secondary | ICD-10-CM | POA: Diagnosis present

## 2018-09-14 DIAGNOSIS — D696 Thrombocytopenia, unspecified: Secondary | ICD-10-CM | POA: Diagnosis present

## 2018-09-14 DIAGNOSIS — M6281 Muscle weakness (generalized): Secondary | ICD-10-CM

## 2018-09-14 DIAGNOSIS — Z8042 Family history of malignant neoplasm of prostate: Secondary | ICD-10-CM

## 2018-09-14 DIAGNOSIS — E119 Type 2 diabetes mellitus without complications: Secondary | ICD-10-CM | POA: Diagnosis present

## 2018-09-14 DIAGNOSIS — D62 Acute posthemorrhagic anemia: Secondary | ICD-10-CM | POA: Diagnosis not present

## 2018-09-14 DIAGNOSIS — I4892 Unspecified atrial flutter: Secondary | ICD-10-CM | POA: Diagnosis present

## 2018-09-14 DIAGNOSIS — S60511A Abrasion of right hand, initial encounter: Secondary | ICD-10-CM | POA: Diagnosis present

## 2018-09-14 DIAGNOSIS — R338 Other retention of urine: Secondary | ICD-10-CM | POA: Diagnosis present

## 2018-09-14 DIAGNOSIS — Z8572 Personal history of non-Hodgkin lymphomas: Secondary | ICD-10-CM

## 2018-09-14 DIAGNOSIS — I10 Essential (primary) hypertension: Secondary | ICD-10-CM | POA: Diagnosis present

## 2018-09-14 DIAGNOSIS — Z8249 Family history of ischemic heart disease and other diseases of the circulatory system: Secondary | ICD-10-CM

## 2018-09-14 DIAGNOSIS — I480 Paroxysmal atrial fibrillation: Secondary | ICD-10-CM | POA: Diagnosis present

## 2018-09-14 DIAGNOSIS — K5900 Constipation, unspecified: Secondary | ICD-10-CM | POA: Diagnosis present

## 2018-09-14 DIAGNOSIS — S72001A Fracture of unspecified part of neck of right femur, initial encounter for closed fracture: Secondary | ICD-10-CM | POA: Diagnosis not present

## 2018-09-14 DIAGNOSIS — G250 Essential tremor: Secondary | ICD-10-CM | POA: Diagnosis present

## 2018-09-14 DIAGNOSIS — N400 Enlarged prostate without lower urinary tract symptoms: Secondary | ICD-10-CM | POA: Diagnosis present

## 2018-09-14 DIAGNOSIS — R269 Unspecified abnormalities of gait and mobility: Secondary | ICD-10-CM

## 2018-09-14 DIAGNOSIS — F329 Major depressive disorder, single episode, unspecified: Secondary | ICD-10-CM | POA: Diagnosis present

## 2018-09-14 NOTE — Therapy (Signed)
[facility] [address]. [place], [place], [address] Phone: [phone]   Fax:  [phone]  Physical Therapy Treatment  Patient Details  Name: Mike Holloway MRN: [id number] Date of Birth: 01/17/1941 Referring Provider (PT): Dr. Manson Passey   Encounter Date: 09/14/2018  PT End of Session - 09/14/18 1439    Visit Number  29    Number of Visits  32    Date for PT Re-Evaluation  09/06/18    PT Start Time  1435    PT Stop Time  1540    PT Time Calculation (min)  65 min    Equipment Utilized During Treatment  Gait belt    Activity Tolerance  Treatment limited secondary to medical complications (Comment);Patient tolerated treatment well    Behavior During Therapy  [facility] for tasks assessed/performed       Past Medical History:  Diagnosis Date  . Arthritis   . Atrial flutter ([place])   . Diabetes mellitus ([place])   . Essential tremor   . Hypercholesteremia   . Hypertension   . Incontinence   . 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 Sleep apnea   . Stroke [facility])     Past Surgical History:  Procedure Laterality Date  . ABLATION    . APPENDECTOMY    . CATARACT EXTRACTION, BILATERAL    . COLONOSCOPY WITH PROPOFOL N/A 03/17/2018   Procedure: COLONOSCOPY WITH PROPOFOL;  Surgeon: Jonathon Bellows, MD;  Location: [facility] ENDOSCOPY;  Service: Gastroenterology;  Laterality: N/A;  . DEEP BRAIN STIMULATOR PLACEMENT    . 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 HEMORRHOID SURGERY    . HERNIA REPAIR    . NASAL SINUS SURGERY    . ORCHIECTOMY    . TONSILLECTOMY      There were no vitals filed for this visit.  Subjective Assessment - 09/14/18 1436    Subjective  Patient states he continues to have eye issues, but is feeling okay at the moment. Patient denies any falls or stumbles since the last visit.      Patient is accompained by:  Family member    Limitations  Lifting;Standing;Walking;House hold activities    Patient Stated Goals  Improve LE strength/ gait and balance with daily tasks.  Return to using cane.      Currently in Pain?  No/denies    Pain Score  1     Pain Location  Knee    Pain Orientation  Right    Pain Descriptors / Indicators  Discomfort    Pain Type  Chronic pain    Pain Onset  More than a month ago    Pain Frequency  Intermittent         TREATMENT  Therapeutic Exercise: Goals Assessment STS x10 3" elevated surface, VCs for hip hinge strategy and hand placement on thighs to assist in knee extension STS x5 2" elevated surface STS x5 1.5" elevated surface  Stair (4) ascend/descend, BUE support, step through gait pattern, SBA ascend, IND descend SPC ambulation x110 feet, SBA, 2 pt gait pattern, noted increased UE tremor, no LOB or unsteadiness to gait Ambulation no AD, 85 feet, CGA/SBA, noted R foot drop with increased fatigue    Treatments unbilled: MHP x15 min at end of session, R knee      PT Education - 09/14/18 1751  Education Details  exercise technique, prognosis, POC    Person(s) Educated  Patient;Spouse    Methods  Explanation;Demonstration;Verbal cues    Comprehension  Verbalized understanding;Returned demonstration;Need further instruction          PT Long Term Goals - 09/14/18 1503      PT LONG TERM GOAL #2   Title  Pt. will increase Berg balance test to >45 out of 56 to improve independence with gait/ decrease fall risk.      Baseline  Berg: 39/56; 8/28 Berg: 43/56; 9/25 Berg: 44/56.  11/13: 46/56    Time  4    Period  Weeks    Status  Achieved      PT LONG TERM GOAL #3   Title  Pt. able to ambulate 100 feet with consistent 2 point gait pattern and use of SPC to improve household mobility.     Baseline  Pt. currently using RW; 8/28 consistent with 2 point gait patter with use of lofstrand crutch.; 09/14/18: able to ambulate w/  SPC 100 feet w/ 2 point gait pattern    Time  4    Period  Weeks    Status  Achieved      PT LONG TERM GOAL #4   Title  Pt. able to stand from normal chair with no UE assist to improve safety/independence with transfers.     Baseline  multiple attempts/ benefits from 2" lift; 1.5" above standard chair able to STS with UE on thigh assist    Time  4    Period  Weeks    Status  Partially Met    Target Date  10/12/18      PT LONG TERM GOAL #5   Title  Pt. able to ambulate on even surface for 100 ft without AD requiring only SBA.    Baseline  Pt. requires CGA with noted LOB when ambulating without AD.; 09/14/2018: Pt ambulated CGA/SBA no AD for 85 feet    Time  4    Period  Weeks    Status  Partially Met    Target Date  10/12/18            Plan - 09/14/18 1752    Clinical Impression Statement  Patient continues to have limitations secondary to vision impairment, but is amenable to therapy. Patient presents with persistent deficits in BLE strength and R knee pain, as well as balance and gait. However, patient demonstrates improvement as evidenced by his ability to stand from a sitting on a surface 1.5 inches higher thana standard chair with minimal use of his hands and moderate verbal cues for technique. Additionally, patient is able to maintain steady 2 pt gait pattern with a SPC and SBA for a distance >100 feet. Patient continues to benefit from use of lofstrand cane 2/2 to increased tremors in the UE. Patient will continue to benefit from skilled therapeutic intervention to improve BLE strength and maintain balance improvements as degenerative neurological disease progresses.    Rehab Potential  Fair    PT Frequency  2x / week    PT Duration  4 weeks    PT Treatment/Interventions  ADLs/Self Care Home Management;Therapeutic activities;Functional mobility training;Stair training;Gait training;Therapeutic exercise;Balance training;Neuromuscular re-education;Patient/family education;Manual  techniques;Passive range of motion    PT Next Visit Plan  Progress LE strengthening.    PT Home Exercise Plan  See handouts    Consulted and Agree with Plan of Care  Patient;Family member/caregiver    Family Member Consulted  spouse: Mike Holloway       Patient will benefit from skilled therapeutic intervention in order to improve the following deficits and impairments:  Abnormal gait, Decreased balance, Decreased endurance, Decreased mobility, Difficulty walking, Decreased range of motion, Decreased activity tolerance, Decreased strength, Impaired flexibility, Postural dysfunction, Pain  Visit Diagnosis: Muscle weakness (generalized)  Gait difficulty  Abnormal posture     Problem List Patient Active Problem List   Diagnosis Date Noted  . 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 HLD (hyperlipidemia) 03/24/2018  . Contusion of right knee 02/14/2018  . Bradycardia 12/28/2017  . 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 Chronic pain of right knee 08/10/2016  . Right ankle pain 08/10/2016  . Venous insufficiency of both lower extremities 05/13/2016  . 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[place]) 12/11/2015  . 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 Lymphoma, 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[place]) 04/03/2015  . Breathlessness on exertion 11/21/2014  . Breath shortness 11/21/2014  . Arthropathy 11/07/2014  . Atrial flutter, paroxysmal ([place]) 11/07/2014  . Type 2 diabetes mellitus ([place]) 11/07/2014  . Benign essential tremor 11/07/2014  . Benign essential HTN 11/07/2014  . Mixed incontinence 11/07/2014  . Hypercholesterolemia without hypertriglyceridemia 11/07/2014  . Apnea, sleep 11/07/2014  . Controlled type 2 diabetes mellitus without complication ([place]) 89/38/1017  . Pure hypercholesterolemia 11/07/2014  . Other abnormalities of gait and mobility 11/02/2011  . Abnormal gait 06/29/2011  . Discoordination 06/29/2011    Myles Gip PT, DPT [phone] 09/14/2018, 5:58 PM  [facility] [address] [place], [place], [address] Phone: [phone]   Fax:  [phone]  Name: Mike Holloway MRN: [id number] Date of Birth: 12-24-1940

## 2018-09-15 ENCOUNTER — Encounter: Payer: Self-pay | Admitting: Emergency Medicine

## 2018-09-15 ENCOUNTER — Inpatient Hospital Stay
Admission: EM | Admit: 2018-09-15 | Discharge: 2018-09-21 | DRG: 481 | Disposition: A | Discharge status: Skilled Nursing Facility | Payer: Medicare Other | Attending: Internal Medicine | Admitting: Internal Medicine

## 2018-09-15 ENCOUNTER — Emergency Department: Payer: Medicare Other

## 2018-09-15 ENCOUNTER — Other Ambulatory Visit: Payer: Self-pay

## 2018-09-15 DIAGNOSIS — Z8572 Personal history of non-Hodgkin lymphomas: Secondary | ICD-10-CM | POA: Diagnosis not present

## 2018-09-15 DIAGNOSIS — S72001A Fracture of unspecified part of neck of right femur, initial encounter for closed fracture: Secondary | ICD-10-CM

## 2018-09-15 DIAGNOSIS — S72009A Fracture of unspecified part of neck of unspecified femur, initial encounter for closed fracture: Secondary | ICD-10-CM | POA: Diagnosis present

## 2018-09-15 DIAGNOSIS — I10 Essential (primary) hypertension: Secondary | ICD-10-CM | POA: Diagnosis present

## 2018-09-15 DIAGNOSIS — S72141A Displaced intertrochanteric fracture of right femur, initial encounter for closed fracture: Secondary | ICD-10-CM | POA: Diagnosis present

## 2018-09-15 DIAGNOSIS — R32 Unspecified urinary incontinence: Secondary | ICD-10-CM | POA: Diagnosis present

## 2018-09-15 DIAGNOSIS — F329 Major depressive disorder, single episode, unspecified: Secondary | ICD-10-CM | POA: Diagnosis present

## 2018-09-15 DIAGNOSIS — D696 Thrombocytopenia, unspecified: Secondary | ICD-10-CM | POA: Diagnosis present

## 2018-09-15 DIAGNOSIS — G250 Essential tremor: Secondary | ICD-10-CM | POA: Diagnosis present

## 2018-09-15 DIAGNOSIS — M1610 Unilateral primary osteoarthritis, unspecified hip: Secondary | ICD-10-CM | POA: Diagnosis present

## 2018-09-15 DIAGNOSIS — N401 Enlarged prostate with lower urinary tract symptoms: Secondary | ICD-10-CM | POA: Diagnosis present

## 2018-09-15 DIAGNOSIS — I4892 Unspecified atrial flutter: Secondary | ICD-10-CM | POA: Diagnosis present

## 2018-09-15 DIAGNOSIS — W010XXA Fall on same level from slipping, tripping and stumbling without subsequent striking against object, initial encounter: Secondary | ICD-10-CM | POA: Diagnosis present

## 2018-09-15 DIAGNOSIS — Z8042 Family history of malignant neoplasm of prostate: Secondary | ICD-10-CM | POA: Diagnosis not present

## 2018-09-15 DIAGNOSIS — Z419 Encounter for procedure for purposes other than remedying health state, unspecified: Secondary | ICD-10-CM

## 2018-09-15 DIAGNOSIS — Z96659 Presence of unspecified artificial knee joint: Secondary | ICD-10-CM | POA: Diagnosis present

## 2018-09-15 DIAGNOSIS — Z6829 Body mass index (BMI) 29.0-29.9, adult: Secondary | ICD-10-CM | POA: Diagnosis not present

## 2018-09-15 DIAGNOSIS — R338 Other retention of urine: Secondary | ICD-10-CM | POA: Diagnosis present

## 2018-09-15 DIAGNOSIS — Z8249 Family history of ischemic heart disease and other diseases of the circulatory system: Secondary | ICD-10-CM | POA: Diagnosis not present

## 2018-09-15 DIAGNOSIS — S60511A Abrasion of right hand, initial encounter: Secondary | ICD-10-CM | POA: Diagnosis present

## 2018-09-15 DIAGNOSIS — D62 Acute posthemorrhagic anemia: Secondary | ICD-10-CM | POA: Diagnosis not present

## 2018-09-15 DIAGNOSIS — N400 Enlarged prostate without lower urinary tract symptoms: Secondary | ICD-10-CM | POA: Diagnosis present

## 2018-09-15 DIAGNOSIS — Z9221 Personal history of antineoplastic chemotherapy: Secondary | ICD-10-CM | POA: Diagnosis not present

## 2018-09-15 DIAGNOSIS — K5900 Constipation, unspecified: Secondary | ICD-10-CM | POA: Diagnosis present

## 2018-09-15 DIAGNOSIS — Z8673 Personal history of transient ischemic attack (TIA), and cerebral infarction without residual deficits: Secondary | ICD-10-CM | POA: Diagnosis not present

## 2018-09-15 DIAGNOSIS — E119 Type 2 diabetes mellitus without complications: Secondary | ICD-10-CM | POA: Diagnosis present

## 2018-09-15 DIAGNOSIS — I480 Paroxysmal atrial fibrillation: Secondary | ICD-10-CM | POA: Diagnosis present

## 2018-09-15 LAB — COMPREHENSIVE METABOLIC PANEL
ALT: 13 U/L (ref 0–44)
AST: 19 U/L (ref 15–41)
Albumin: 3.9 g/dL (ref 3.5–5.0)
Alkaline Phosphatase: 74 U/L (ref 38–126)
Anion gap: 6 (ref 5–15)
BUN: 19 mg/dL (ref 8–23)
CO2: 24 mmol/L (ref 22–32)
Calcium: 9.5 mg/dL (ref 8.9–10.3)
Chloride: 102 mmol/L (ref 98–111)
Creatinine, Ser: 1.2 mg/dL (ref 0.61–1.24)
GFR calc Af Amer: >60 mL/min (ref >60)
GFR calc non Af Amer: 58 mL/min — ABNORMAL LOW (ref >60)
Glucose, Bld: 123 mg/dL — ABNORMAL HIGH (ref 70–99)
Potassium: 4 mmol/L (ref 3.5–5.1)
Sodium: 132 mmol/L — ABNORMAL LOW (ref 135–145)
Total Bilirubin: 0.6 mg/dL (ref 0.3–1.2)
Total Protein: 7.5 g/dL (ref 6.5–8.1)

## 2018-09-15 LAB — CBC WITH DIFFERENTIAL/PLATELET
Abs Immature Granulocytes: 0.11 10*3/uL — ABNORMAL HIGH (ref 0.00–0.07)
Basophils Absolute: 0.1 10*3/uL (ref 0.0–0.1)
Basophils Relative: 1 %
Eosinophils Absolute: 0.1 10*3/uL (ref 0.0–0.5)
Eosinophils Relative: 1 %
HCT: 36.8 % — ABNORMAL LOW (ref 39.0–52.0)
Hemoglobin: 12.5 g/dL — ABNORMAL LOW (ref 13.0–17.0)
Immature Granulocytes: 1 %
Lymphocytes Relative: 16 %
Lymphs Abs: 1.5 10*3/uL (ref 0.7–4.0)
MCH: 33.3 pg (ref 26.0–34.0)
MCHC: 34 g/dL (ref 30.0–36.0)
MCV: 98.1 fL (ref 80.0–100.0)
Monocytes Absolute: 0.8 10*3/uL (ref 0.1–1.0)
Monocytes Relative: 8 %
Neutro Abs: 6.6 10*3/uL (ref 1.7–7.7)
Neutrophils Relative %: 73 %
Platelets: 135 10*3/uL — ABNORMAL LOW (ref 150–400)
RBC: 3.75 MIL/uL — ABNORMAL LOW (ref 4.22–5.81)
RDW: 13.5 % (ref 11.5–15.5)
WBC: 9.1 10*3/uL (ref 4.0–10.5)
nRBC: 0 % (ref 0.0–0.2)

## 2018-09-15 LAB — PROTIME-INR
INR: 0.99
Prothrombin Time: 13 seconds (ref 11.4–15.2)

## 2018-09-15 LAB — TSH: TSH: 1.596 u[IU]/mL (ref 0.350–4.500)

## 2018-09-15 LAB — SURGICAL PCR SCREEN
MRSA, PCR: NEGATIVE
Staphylococcus aureus: NEGATIVE

## 2018-09-15 LAB — APTT: aPTT: 32 seconds (ref 24–36)

## 2018-09-15 MED ORDER — PROPRANOLOL HCL ER 80 MG PO CP24
160.0000 mg | ORAL_CAPSULE | Freq: Every day | ORAL | Status: DC
  Administered 2018-09-16 – 2018-09-21 (×6): 160 mg via ORAL
  Filled 2018-09-15 (×6): qty 2

## 2018-09-15 MED ORDER — FLUOXETINE HCL 20 MG PO TABS
10.0000 mg | ORAL_TABLET | Freq: Every day | ORAL | Status: DC
  Filled 2018-09-15: qty 1

## 2018-09-15 MED ORDER — CEFAZOLIN SODIUM-DEXTROSE 2-4 GM/100ML-% IV SOLN
2.0000 g | Freq: Once | INTRAVENOUS | Status: AC
  Administered 2018-09-16: 2 g via INTRAVENOUS
  Filled 2018-09-15: qty 100

## 2018-09-15 MED ORDER — TRIAMTERENE-HCTZ 37.5-25 MG PO TABS
1.0000 | ORAL_TABLET | Freq: Every day | ORAL | Status: DC
  Administered 2018-09-17: 1 via ORAL
  Filled 2018-09-15 (×3): qty 1

## 2018-09-15 MED ORDER — VITAMIN D 25 MCG (1000 UNIT) PO TABS
1000.0000 [IU] | ORAL_TABLET | Freq: Every day | ORAL | Status: DC
  Administered 2018-09-15 – 2018-09-20 (×6): 1000 [IU] via ORAL
  Filled 2018-09-15 (×6): qty 1

## 2018-09-15 MED ORDER — ONDANSETRON HCL 4 MG/2ML IJ SOLN
4.0000 mg | Freq: Four times a day (QID) | INTRAMUSCULAR | Status: DC | PRN
  Administered 2018-09-16: 4 mg via INTRAVENOUS
  Filled 2018-09-15: qty 2

## 2018-09-15 MED ORDER — TAMSULOSIN HCL 0.4 MG PO CAPS
0.4000 mg | ORAL_CAPSULE | Freq: Every day | ORAL | Status: DC
  Administered 2018-09-17 – 2018-09-21 (×5): 0.4 mg via ORAL
  Filled 2018-09-15 (×5): qty 1

## 2018-09-15 MED ORDER — DOCUSATE SODIUM 100 MG PO CAPS
100.0000 mg | ORAL_CAPSULE | Freq: Two times a day (BID) | ORAL | Status: DC
  Administered 2018-09-15: 100 mg via ORAL
  Filled 2018-09-15: qty 1

## 2018-09-15 MED ORDER — SODIUM CHLORIDE 0.9 % IV SOLN
INTRAVENOUS | Status: DC
  Administered 2018-09-15 – 2018-09-16 (×2): via INTRAVENOUS

## 2018-09-15 MED ORDER — FENTANYL CITRATE (PF) 100 MCG/2ML IJ SOLN
50.0000 ug | INTRAMUSCULAR | Status: AC | PRN
  Administered 2018-09-15 (×2): 50 ug via INTRAVENOUS
  Filled 2018-09-15 (×2): qty 2

## 2018-09-15 MED ORDER — MORPHINE SULFATE (PF) 2 MG/ML IV SOLN
2.0000 mg | INTRAVENOUS | Status: DC | PRN
  Administered 2018-09-15 – 2018-09-16 (×2): 2 mg via INTRAVENOUS
  Filled 2018-09-15 (×2): qty 1

## 2018-09-15 MED ORDER — MUPIROCIN 2 % EX OINT
1.0000 "application " | TOPICAL_OINTMENT | Freq: Two times a day (BID) | CUTANEOUS | Status: DC
  Administered 2018-09-15 – 2018-09-18 (×5): 1 via NASAL
  Filled 2018-09-15: qty 22

## 2018-09-15 MED ORDER — PRIMIDONE 50 MG PO TABS
100.0000 mg | ORAL_TABLET | Freq: Every morning | ORAL | Status: DC
  Administered 2018-09-17 – 2018-09-21 (×5): 100 mg via ORAL
  Filled 2018-09-15 (×6): qty 2

## 2018-09-15 MED ORDER — PRIMIDONE 50 MG PO TABS
50.0000 mg | ORAL_TABLET | Freq: Every day | ORAL | Status: DC
  Administered 2018-09-15 – 2018-09-20 (×6): 50 mg via ORAL
  Filled 2018-09-15 (×7): qty 1

## 2018-09-15 MED ORDER — ACETAMINOPHEN 650 MG RE SUPP: 650.0000 mg | Freq: Four times a day (QID) | RECTAL | Status: DC | PRN

## 2018-09-15 MED ORDER — FAMOTIDINE 20 MG PO TABS
20.0000 mg | ORAL_TABLET | Freq: Two times a day (BID) | ORAL | Status: DC
  Administered 2018-09-15 – 2018-09-21 (×11): 20 mg via ORAL
  Filled 2018-09-15 (×11): qty 1

## 2018-09-15 MED ORDER — ONDANSETRON HCL 4 MG PO TABS: 4.0000 mg | ORAL_TABLET | Freq: Four times a day (QID) | ORAL | Status: DC | PRN

## 2018-09-15 MED ORDER — MIRABEGRON ER 50 MG PO TB24
50.0000 mg | ORAL_TABLET | Freq: Every day | ORAL | Status: DC
  Administered 2018-09-17 – 2018-09-21 (×5): 50 mg via ORAL
  Filled 2018-09-15 (×7): qty 1

## 2018-09-15 MED ORDER — ACETAMINOPHEN 325 MG PO TABS
650.0000 mg | ORAL_TABLET | Freq: Four times a day (QID) | ORAL | Status: DC | PRN
  Administered 2018-09-19 – 2018-09-21 (×5): 650 mg via ORAL
  Filled 2018-09-15 (×5): qty 2

## 2018-09-15 NOTE — ED Triage Notes (Signed)
Pt arrived via EMS from home with reports of mechanical fall that happened when he stepped off of his porch to grab a package that was heavier than he thought and fell onto right side and hip. Outward rotation noted to hip.   Per EMS pt's initial pain level was 9/10 was given 75 mcg Fentanyl which brought his pain level down to 6/10 and then was given an additional 25 mcg of Fentanyl and pain down to 3/10.  Pt is alert and oriented on arrival.

## 2018-09-15 NOTE — Consult Note (Signed)
Reason for consult is right hip fracture History: This is a 77 year old who has a history of a prior partial knee replacement on the right as well as some peripheral neuropathy and weakness of the right leg related to prior brain stimulator activity.  He has been using a cane.  He went out to pick about [employer] package and fell fracturing the right hip also getting abrasions to the right hand and bruising his right elbow.  He has had difficulty ambulating secondary to his peripheral neuropathy as well as right leg weakness related to episode with brain stimulator that was placed for tremors.  On exam the right leg is shortened and externally rotated with swelling but intact skin to the right hip.  He has diminished sensation to the foot but is able flex to the toes has trace posterior tib pulse  Radiographs reveal a very comminuted unstable subtrochanteric stroke hip fracture with mild hip degenerative arthritis  Impression is unstable hip fracture in a patient with some limitations on his mobility from underlying problems  Plan is for ORIF in the morning with IM rod possibly cerclage wire.  Risk benefits possible complications were discussed with family and patient.  Site marked

## 2018-09-15 NOTE — ED Provider Notes (Addendum)
X-ray of the knee is negative.  3 of the chest shows cardiomegaly per radiology x-ray of the hip shows a comminuted right intertrochanteric fracture that extends to the subtrochanteric area all these films were read by radiology and reviewed by me.  Dr. Rudene Christians is been notified.   Nena Polio, MD 09/15/18 1617    Nena Polio, MD 09/15/18 [phone]

## 2018-09-15 NOTE — ED Notes (Signed)
ED TO INPATIENT HANDOFF REPORT  Name/Age/Gender Mike Holloway 76 y.o. male  Code Status Code Status History    Date Active Date Inactive Code Status Order ID Comments User Context   03/25/2018 0111 03/29/2018 2049 Full Code [id number]  Lance Coon, MD Inpatient      Home/SNF/Other Home or rehab possibly   Chief Complaint fall  Level of Care/Admitting Diagnosis ED Disposition    ED Disposition Condition [place]: [place] [[id number]]  Level of Care: Med-Surg [16]  Diagnosis: Hip fracture [facility]) [859292]  Admitting Physician: Harrie Foreman [[id number]]  Attending Physician: Harrie Foreman [[id number]]  Estimated length of stay: past midnight tomorrow  Certification:: I certify this patient will need inpatient services for at least 2 midnights  PT Class (Do Not Modify): Inpatient [101]  PT Acc Code (Do Not Modify): Private [1]       Medical History Past Medical History:  Diagnosis Date  . Arthritis   . Atrial flutter ([place])   . Diabetes mellitus ([place])   . Essential tremor   . Essential tremor    deep brain stimulator   . Hypercholesteremia   . Hypertension   . Incontinence   . 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 Sleep apnea   . Stroke [facility])     Allergies Allergies  Allergen Reactions  . Clindamycin Diarrhea    Contracted C. Diff x 2  . Flagyl [Metronidazole] Swelling    Swollen tongue, excessive shaking,    . Ambien  [Zolpidem]     Other reaction(s): Other (See Comments) Disoriented and moody Other reaction(s): Other (See Comments) Disoriented and moody  . Penicillins Rash    Has patient had a PCN reaction causing immediate rash, facial/tongue/throat swelling, SOB or lightheadedness with hypotension: Unknown Has patient had a PCN reaction causing severe rash involving mucus membranes or skin necrosis: Unknown Has patient had a PCN reaction that required hospitalization: No Has patient  had a PCN reaction occurring within the last 10 years: No If all of the above answers are "NO", then may proceed with Cephalosporin use.  . Sulfa Antibiotics Rash    IV Location/Drains/Wounds Patient Lines/Drains/Airways Status   Active Line/Drains/Airways    Name:   Placement date:   Placement time:   Site:   Days:   Peripheral IV 09/15/18 Right Hand   09/15/18    -    Hand   less than 1   Peripheral IV 09/15/18 Left Antecubital   09/15/18    1428    Antecubital   less than 1          Labs/Imaging Results for orders placed or performed during the hospital encounter of 09/15/18 (from the past 48 hour(s))  CBC WITH DIFFERENTIAL     Status: Abnormal   Collection Time: 09/15/18  2:29 PM  Result Value Ref Range   WBC 9.1 4.0 - 10.5 K/uL   RBC 3.75 (L) 4.22 - 5.81 MIL/uL   Hemoglobin 12.5 (L) 13.0 - 17.0 g/dL   HCT 36.8 (L) 39.0 - 52.0 %   MCV 98.1 80.0 - 100.0 fL   MCH 33.3 26.0 - 34.0 pg   MCHC 34.0 30.0 - 36.0 g/dL   RDW 13.5 11.5 - 15.5 %   Platelets 135 (L) 150 - 400 K/uL    Comment: Immature Platelet Fraction may be clinically indicated, consider ordering this additional test [id number]    nRBC 0.0 0.0 -  0.2 %   Neutrophils Relative % 73 %   Neutro Abs 6.6 1.7 - 7.7 K/uL   Lymphocytes Relative 16 %   Lymphs Abs 1.5 0.7 - 4.0 K/uL   Monocytes Relative 8 %   Monocytes Absolute 0.8 0.1 - 1.0 K/uL   Eosinophils Relative 1 %   Eosinophils Absolute 0.1 0.0 - 0.5 K/uL   Basophils Relative 1 %   Basophils Absolute 0.1 0.0 - 0.1 K/uL   Immature Granulocytes 1 %   Abs Immature Granulocytes 0.11 (H) 0.00 - 0.07 K/uL    Comment: Performed at [facility], [address] [place], [place] [address]  Protime-INR     Status: None   Collection Time: 09/15/18  2:29 PM  Result Value Ref Range   Prothrombin Time 13.0 11.4 - 15.2 seconds   INR 0.99     Comment: Performed at [facility], [address] [place], [place] [address]  Type and screen [place]     Status: None   Collection Time: 09/15/18  2:29 PM  Result Value Ref Range   ABO/RH(D) O POS    Antibody Screen NEG    Sample Expiration      09/18/2018 Performed at [facility], [address] [place], [place] [address]   Comprehensive metabolic panel     Status: Abnormal   Collection Time: 09/15/18  2:29 PM  Result Value Ref Range   Sodium 132 (L) 135 - 145 mmol/L   Potassium 4.0 3.5 - 5.1 mmol/L   Chloride 102 98 - 111 mmol/L   CO2 24 22 - 32 mmol/L   Glucose, Bld 123 (H) 70 - 99 mg/dL   BUN 19 8 - 23 mg/dL   Creatinine, Ser 1.20 0.61 - 1.24 mg/dL   Calcium 9.5 8.9 - 10.3 mg/dL   Total Protein 7.5 6.5 - 8.1 g/dL   Albumin 3.9 3.5 - 5.0 g/dL   AST 19 15 - 41 U/L   ALT 13 0 - 44 U/L   Alkaline Phosphatase 74 38 - 126 U/L   Total Bilirubin 0.6 0.3 - 1.2 mg/dL   GFR calc non Af Amer 58 (L) >60 mL/min   GFR calc Af Amer >60 >60 mL/min   Anion gap 6 5 - 15    Comment: Performed at [facility], [address] [place], [place] [address]  APTT     Status: None   Collection Time: 09/15/18  2:29 PM  Result Value Ref Range   aPTT 32 24 - 36 seconds    Comment: Performed at [facility], [address] [place], [place] [address]   Dg Chest 1 View  Result Date: 09/15/2018 CLINICAL DATA:  Pain following fall EXAM: CHEST  1 VIEW COMPARISON:  None. FINDINGS: There is no edema or consolidation. There is cardiomegaly with pulmonary vascularity normal. No adenopathy. Stimulator is seen on the left with leads extending into the left neck region. IMPRESSION: Cardiomegaly.  No edema or consolidation. Electronically Signed   By: Lowella Grip III M.D.   On: 09/15/2018 15:12   Dg Knee 2 Views Right  Result Date: 09/15/2018 CLINICAL DATA:  [place].  Right knee pain. EXAM: RIGHT KNEE - 1-2 VIEW COMPARISON:  Intraoperative spot films from 08/23/2018 FINDINGS: Unicompartmental (patellofemoral) prosthesis without complicating features.  No knee fractures identified. Small joint effusion. 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 No acute fracture. Electronically Signed   By: Ricky Stabs.D.  On: 09/15/2018 15:15   Dg Hip Unilat With Pelvis 2-3 Views Right  Result Date: 09/15/2018 CLINICAL DATA:  [place] at home today. EXAM: DG HIP (WITH OR WITHOUT PELVIS) 2-3V RIGHT COMPARISON:  None. FINDINGS: Displaced intertrochanteric fracture of the right hip with a varus deformity. There is a large butterfly fragment involving the lesser trochanter and also slight subtrochanteric extension. The right hip is normally located. The left hip is intact. The pubic symphysis and SI joints are intact. IMPRESSION: Displaced and comminuted intertrochanteric fracture of the right hip with a subtrochanteric component. Electronically Signed   By: Marijo Sanes M.D.   On: 09/15/2018 15:13    Pending Labs [employer] (From admission, onward)    Start     Ordered   Signed and Held  TSH  Add-on,   R     Signed and Held   Signed and Held  Hemoglobin A1c  Add-on,   R     Signed and Held          Vitals/Pain Today's Vitals   09/15/18 1655 09/15/18 1700 09/15/18 1730 09/15/18 1800  BP:  130/71 111/68 129/77  Pulse:  (!) 56 (!) 55 60  Resp:  11 12 17   Temp:      TempSrc:      SpO2:  98% 95% 99%  Weight:      Height:      PainSc: 8        Isolation Precautions No active isolations  Medications Medications  ceFAZolin (ANCEF) IVPB 2g/100 mL premix (has no administration in time range)  fentaNYL (SUBLIMAZE) injection 50 mcg (50 mcg Intravenous Given 09/15/18 1655)    Mobility non-ambulatory

## 2018-09-15 NOTE — ED Provider Notes (Signed)
[facility] Emergency Department Provider Note   ____________________________________________    I have reviewed the triage vital signs and the nursing notes.   HISTORY  Chief Complaint Fall and Hip Pain     HPI Mike Holloway is a 77 y.o. male who presents after a fall.  Patient reports he lost his balance and fell over onto his right hip.  Complains of severe pain in his right hip, EMS gave 100 mcg of fentanyl which did help his symptoms.  He states his pain is a 3 currently.  He also has some mild right elbow discomfort and abrasions to his right hand.  Additionally he has some pain to his right knee which she reports is mild.  No head injury.  Not on blood thinners reportedly.  No abdominal pain or chest pain or shortness of breath   Past Medical History:  Diagnosis Date  . Arthritis   . Atrial flutter ([place])   . Diabetes mellitus ([place])   . Essential tremor   . Hypercholesteremia   . Hypertension   . Incontinence   . 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 Sleep apnea   . Stroke [facility])     Patient Active Problem List   Diagnosis Date Noted  . 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 HLD (hyperlipidemia) 03/24/2018  . Contusion of right knee 02/14/2018  . Bradycardia 12/28/2017  . 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 Chronic pain of right knee 08/10/2016  . Right ankle pain 08/10/2016  . Venous insufficiency of both lower extremities 05/13/2016  . 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[place]) 12/11/2015  . 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 Lymphoma, 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[place]) 04/03/2015  . Breathlessness on exertion 11/21/2014  . Breath shortness 11/21/2014  . Arthropathy 11/07/2014  . Atrial flutter, paroxysmal ([place]) 11/07/2014  . Type 2 diabetes mellitus ([place]) 11/07/2014  . Benign essential tremor 11/07/2014  . Benign essential HTN 11/07/2014  . Mixed incontinence 11/07/2014  .  Hypercholesterolemia without hypertriglyceridemia 11/07/2014  . Apnea, sleep 11/07/2014  . Controlled type 2 diabetes mellitus without complication ([place]) 62/69/4854  . Pure hypercholesterolemia 11/07/2014  . Other abnormalities of gait and mobility 11/02/2011  . Abnormal gait 06/29/2011  . Discoordination 06/29/2011    Past Surgical History:  Procedure Laterality Date  . ABLATION    . APPENDECTOMY    . CATARACT EXTRACTION, BILATERAL    . COLONOSCOPY WITH PROPOFOL N/A 03/17/2018   Procedure: COLONOSCOPY WITH PROPOFOL;  Surgeon: Jonathon Bellows, MD;  Location: [facility] ENDOSCOPY;  Service: Gastroenterology;  Laterality: N/A;  . DEEP BRAIN STIMULATOR PLACEMENT    . 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 HEMORRHOID SURGERY    . HERNIA REPAIR    . NASAL SINUS SURGERY    . ORCHIECTOMY    . TONSILLECTOMY      Prior to Admission medications   Medication Sig Start Date End Date Taking? Authorizing Provider  Cholecalciferol (VITAMIN D-1000 MAX ST) 1000 UNITS tablet Take 1,000 Units by mouth at bedtime.     [provider]  diclofenac sodium (VOLTAREN) 1 % GEL Apply 2 g topically 2 (two) times daily as needed (pain).     [provider]  famotidine (PEPCID) 20 MG tablet TK 1 T PO BID 05/20/18   [provider]  FLUoxetine (PROZAC) 10 MG tablet Take 10 mg by  mouth daily.    [provider]  mirabegron ER (MYRBETRIQ) 50 MG TB24 tablet Take 1 tablet (50 mg total) by mouth daily. 05/11/18   Billey Co, MD  montelukast (SINGULAIR) 10 MG tablet Take 10 mg by mouth at bedtime.    [provider]  neomycin-polymyxin b-dexamethasone (MAXITROL) 3.5-10000-0.1 SUSP SHAKE LQ AND INT 1 GTT IN OU TID FOR 14 DAYS 05/16/18   [provider]  NON FORMULARY Real time pain relief    [provider]  primidone (MYSOLINE) 50 MG  tablet Take 50-100 mg by mouth 2 (two) times daily. 100 mg in the morning and 50 mg at bedtime    [provider]  Probiotic Product (VSL#3 PO) Take 1 capsule by mouth 2 (two) times daily.     [provider]  propranolol ER (INDERAL LA) 80 MG 24 hr capsule Take 160 mg by mouth daily.     [provider]  tamsulosin (FLOMAX) 0.4 MG CAPS capsule Take 0.4 mg by mouth daily.    [provider]  Triamcinolone Acetonide (NASACORT AQ NA) Place 2 sprays into the nose daily.     [provider]  triamterene-hydrochlorothiazide (MAXZIDE-25) 37.5-25 MG tablet TK 1 T PO QD 04/09/18   [provider]     Allergies Clindamycin; Flagyl [metronidazole]; Ambien  [zolpidem]; Penicillins; and Sulfa antibiotics  Family History  Problem Relation Age of Onset  . Hematuria Father   . Prostate cancer Father   . Heart disease Mother   . Kidney disease Neg Hx   . Bladder Cancer Neg Hx     Social History Social History   Tobacco Use  . Smoking status: Never Smoker  . Smokeless tobacco: Never Used  Substance Use Topics  . Alcohol use: No    Alcohol/week: 0.0 standard drinks  . Drug use: No    Review of Systems  Constitutional: No fever/chills Eyes: No visual changes.  ENT: No neck pain Cardiovascular: Denies chest pain. Respiratory: Denies shortness of breath. Gastrointestinal: No abdominal pain.  No nausea, no vomiting.   Genitourinary: Negative for dysuria. Musculoskeletal: As above Skin: Abrasions as above Neurological: Negative for headaches or weakness   ____________________________________________   PHYSICAL EXAM:  VITAL SIGNS: ED Triage Vitals [09/15/18 1411]  Enc Vitals Group     BP      Pulse      Resp      Temp      Temp src      SpO2      Weight 89.4 kg (197 lb)     Height 1.746 m (5' 8.75")     Head Circumference      Peak Flow      Pain Score 4     Pain Loc      Pain Edu?      Excl. in [place]?     Constitutional:  Alert and oriented. No acute distress.  Eyes: Conjunctivae are normal.  Head: Atraumatic. Nose: No congestion/rhinnorhea. Mouth/Throat: Mucous membranes are moist.   Neck:  Painless ROM, no vertebral tenderness palpation Cardiovascular: Normal rate. Grossly normal heart sounds.  Good peripheral circulation. Respiratory: Normal respiratory effort.  No retractions. Lungs CTAB. Gastrointestinal: Soft and nontender. No distention.    Musculoskeletal: Right leg is externally rotated and shortened, unable to tolerate any movement.  Warm and well perfused.  Abrasion to the right elbow, normal range of motion Neurologic:  Normal speech and language. No gross focal neurologic deficits are appreciated.  Skin:  Skin is warm, dry and intact.  Abrasions to the right fingers Psychiatric: Mood and affect are normal. Speech and behavior are normal.  ____________________________________________   LABS (all labs ordered are listed, but only abnormal results are displayed)  Labs Reviewed  CBC WITH DIFFERENTIAL/PLATELET - Abnormal; Notable for the following components:      Result Value   RBC 3.75 (*)    Hemoglobin 12.5 (*)    HCT 36.8 (*)    Platelets 135 (*)    Abs Immature Granulocytes 0.11 (*)    All other components within normal limits  COMPREHENSIVE METABOLIC PANEL - Abnormal; Notable for the following components:   Sodium 132 (*)    Glucose, Bld 123 (*)    GFR calc non Af Amer 58 (*)    All other components within normal limits  PROTIME-INR  APTT  TYPE AND SCREEN   ____________________________________________  EKG  ED ECG REPORT I, Lavonia Drafts, the attending physician, personally viewed and interpreted this ECG.  Date: 09/15/2018  Rhythm: Atrial fibrillation QRS Axis: normal Intervals: Right bundle branch block ST/T Wave abnormalities: normal Narrative Interpretation: no evidence of acute ischemia  ____________________________________________  RADIOLOGY  X-ray  hip/pelvis Right knee ____________________________________________   PROCEDURES  Procedure(s) performed: No  Procedures   Critical Care performed: No ____________________________________________   INITIAL IMPRESSION / ASSESSMENT AND PLAN / ED COURSE  Pertinent labs & imaging results that were available during my care of the patient were reviewed by me and considered in my medical decision making (see chart for details).  Patient presents after mechanical fall, exam suspicious for right hip fracture.  Pending x-rays, patient's pain is controlled after 100 mcg of fentanyl currently.  ----------------------------------------- 3:00 PM on 09/15/2018 -----------------------------------------  Pending x-ray results, I have asked Dr. Cinda Quest to follow-up on x-ray results, including knee and hip and chest x-rays.  Anticipate need for admission and orthopedic consultation given exam    ____________________________________________   FINAL CLINICAL IMPRESSION(S) / ED DIAGNOSES  Final diagnoses:  Closed fracture of right hip, initial encounter [facility])        Note:  This document was prepared using Dragon voice recognition software and may include unintentional dictation errors.    Lavonia Drafts, MD 09/15/18 1500

## 2018-09-15 NOTE — H&P (Signed)
Mike Holloway is an 77 y.o. male.   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  The patient bent over to pick up a package on his porch and lost his balance and struck his right hip.  He immediately felt pain and was unable to stand.  Hip was found to be shortened and externally rotated.  X-ray confirmed right hip fracture which prompted the emergency department staff call hospitalist service for admission.  Past Medical History:  Diagnosis Date  . Arthritis   . Atrial flutter ([place])   . Diabetes mellitus ([place])   . Essential tremor   . Essential tremor    deep brain stimulator   . Hypercholesteremia   . Hypertension   . Incontinence   . 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[place])    grew on the testical  . Sleep apnea   . Stroke [facility])     Past Surgical History:  Procedure Laterality Date  . ABLATION    . APPENDECTOMY    . CATARACT EXTRACTION, BILATERAL    . COLONOSCOPY WITH PROPOFOL N/A 03/17/2018   Procedure: COLONOSCOPY WITH PROPOFOL;  Surgeon: Jonathon Bellows, MD;  Location: [facility] ENDOSCOPY;  Service: Gastroenterology;  Laterality: N/A;  . DEEP BRAIN STIMULATOR PLACEMENT    . 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 HEMORRHOID SURGERY    . HERNIA REPAIR    . NASAL SINUS SURGERY    . ORCHIECTOMY    . TONSILLECTOMY      Family History  Problem Relation Age of Onset  . Hematuria Father   . Prostate cancer Father   . Heart disease Mother   . Kidney disease Neg Hx   . Bladder Cancer Neg Hx    Social History:  reports that he has never smoked. He has never used smokeless tobacco. He reports that he does not drink alcohol or use  drugs.  Allergies:  Allergies  Allergen Reactions  . Clindamycin Diarrhea    Contracted C. Diff x 2  . Flagyl [Metronidazole] Swelling    Swollen tongue, excessive shaking,    . Ambien  [Zolpidem]     Other reaction(s): Other (See Comments) Disoriented and moody Other reaction(s): Other (See Comments) Disoriented and moody  . Penicillins Rash    Has patient had a PCN reaction causing immediate rash, facial/tongue/throat swelling, SOB or lightheadedness with hypotension: Unknown Has patient had a PCN reaction causing severe rash involving mucus membranes or skin necrosis: Unknown Has patient had a PCN reaction that required hospitalization: No Has patient had a PCN reaction occurring within the last 10 years: No If all of the above answers are "NO", then may proceed with Cephalosporin use.  . Sulfa Antibiotics Rash    Medications Prior to Admission  Medication Sig Dispense Refill  . Cholecalciferol (VITAMIN D-1000 MAX ST) 1000 UNITS tablet Take 1,000 Units by mouth at bedtime.     . diclofenac sodium (VOLTAREN) 1 % GEL Apply 2 g topically 2 (two) times daily as needed (pain).     . famotidine (PEPCID) 20 MG tablet TK 1 T PO BID  3  . FLUoxetine (PROZAC) 10 MG tablet Take 10 mg by mouth daily.    . mirabegron ER (MYRBETRIQ) 50 MG TB24  tablet Take 1 tablet (50 mg total) by mouth daily. 30 tablet 5  . primidone (MYSOLINE) 50 MG tablet Take 50-100 mg by mouth 2 (two) times daily. 100 mg in the morning and 50 mg at bedtime    . propranolol ER (INDERAL LA) 80 MG 24 hr capsule Take 160 mg by mouth daily.     . tamsulosin (FLOMAX) 0.4 MG CAPS capsule Take 0.4 mg by mouth daily.    [name] triamterene-hydrochlorothiazide (MAXZIDE-25) 37.5-25 MG tablet TK 1 T PO QD  1    Results for orders placed or performed during the hospital encounter of 09/15/18 (from the past 48 hour(s))  CBC WITH DIFFERENTIAL     Status: Abnormal   Collection Time: 09/15/18  2:29 PM  Result Value Ref Range   WBC 9.1 4.0  - 10.5 K/uL   RBC 3.75 (L) 4.22 - 5.81 MIL/uL   Hemoglobin 12.5 (L) 13.0 - 17.0 g/dL   HCT 36.8 (L) 39.0 - 52.0 %   MCV 98.1 80.0 - 100.0 fL   MCH 33.3 26.0 - 34.0 pg   MCHC 34.0 30.0 - 36.0 g/dL   RDW 13.5 11.5 - 15.5 %   Platelets 135 (L) 150 - 400 K/uL    Comment: Immature Platelet Fraction may be clinically indicated, consider ordering this additional test [id number]    nRBC 0.0 0.0 - 0.2 %   Neutrophils Relative % 73 %   Neutro Abs 6.6 1.7 - 7.7 K/uL   Lymphocytes Relative 16 %   Lymphs Abs 1.5 0.7 - 4.0 K/uL   Monocytes Relative 8 %   Monocytes Absolute 0.8 0.1 - 1.0 K/uL   Eosinophils Relative 1 %   Eosinophils Absolute 0.1 0.0 - 0.5 K/uL   Basophils Relative 1 %   Basophils Absolute 0.1 0.0 - 0.1 K/uL   Immature Granulocytes 1 %   Abs Immature Granulocytes 0.11 (H) 0.00 - 0.07 K/uL    Comment: Performed at [facility], [address] [place], [place] [address]  Protime-INR     Status: None   Collection Time: 09/15/18  2:29 PM  Result Value Ref Range   Prothrombin Time 13.0 11.4 - 15.2 seconds   INR 0.99     Comment: Performed at [facility], [address] [place], [place] [address]  Type and screen [place]     Status: None   Collection Time: 09/15/18  2:29 PM  Result Value Ref Range   ABO/RH(D) O POS    Antibody Screen NEG    Sample Expiration      09/18/2018 Performed at [facility], [address] [place], [place] [address]   Comprehensive metabolic panel     Status: Abnormal   Collection Time: 09/15/18  2:29 PM  Result Value Ref Range   Sodium 132 (L) 135 - 145 mmol/L   Potassium 4.0 3.5 - 5.1 mmol/L   Chloride 102 98 - 111 mmol/L   CO2 24 22 - 32 mmol/L   Glucose, Bld 123 (H) 70 - 99 mg/dL   BUN 19 8 - 23 mg/dL   Creatinine, Ser 1.20 0.61 - 1.24 mg/dL   Calcium 9.5 8.9 - 10.3 mg/dL   Total Protein 7.5 6.5 - 8.1 g/dL   Albumin 3.9 3.5 - 5.0 g/dL   AST 19 15 - 41 U/L   ALT 13 0 - 44 U/L    Alkaline Phosphatase 74 38 - 126 U/L   Total Bilirubin 0.6 0.3 -  1.2 mg/dL   GFR calc non Af Amer 58 (L) >60 mL/min   GFR calc Af Amer >60 >60 mL/min   Anion gap 6 5 - 15    Comment: Performed at [facility], [address] [place], [place] [address]  APTT     Status: None   Collection Time: 09/15/18  2:29 PM  Result Value Ref Range   aPTT 32 24 - 36 seconds    Comment: Performed at [facility], [address] [place], [place] [address]  TSH     Status: None   Collection Time: 09/15/18  2:29 PM  Result Value Ref Range   TSH 1.596 0.350 - 4.500 uIU/mL    Comment: Performed by a 3rd Generation assay with a functional sensitivity of <=0.01 uIU/mL. Performed at [facility], [address] [place], [place] [address]    Dg Chest 1 View  Result Date: 09/15/2018 CLINICAL DATA:  Pain following fall EXAM: CHEST  1 VIEW COMPARISON:  None. FINDINGS: There is no edema or consolidation. There is cardiomegaly with pulmonary vascularity normal. No adenopathy. Stimulator is seen on the left with leads extending into the left neck region. IMPRESSION: Cardiomegaly.  No edema or consolidation. Electronically Signed   By: Lowella Grip III M.D.   On: 09/15/2018 15:12   Dg Knee 2 Views Right  Result Date: 09/15/2018 CLINICAL DATA:  [place].  Right knee pain. EXAM: RIGHT KNEE - 1-2 VIEW COMPARISON:  Intraoperative spot films from 08/23/2018 FINDINGS: Unicompartmental (patellofemoral) prosthesis without complicating features. No knee fractures identified. Small joint effusion. 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 No acute fracture. Electronically Signed   By: Marijo Sanes M.D.   On: 09/15/2018 15:15   Dg Hip Unilat With Pelvis 2-3 Views Right  Result Date: 09/15/2018 CLINICAL DATA:  [place] at home today. EXAM: DG HIP (WITH OR WITHOUT PELVIS) 2-3V RIGHT COMPARISON:  None. FINDINGS: Displaced intertrochanteric fracture of  the right hip with a varus deformity. There is a large butterfly fragment involving the lesser trochanter and also slight subtrochanteric extension. The right hip is normally located. The left hip is intact. The pubic symphysis and SI joints are intact. IMPRESSION: Displaced and comminuted intertrochanteric fracture of the right hip with a subtrochanteric component. Electronically Signed   By: Marijo Sanes M.D.   On: 09/15/2018 15:13    Review of Systems  Constitutional: Negative for chills and fever.  HENT: Negative for sore throat and tinnitus.   Eyes: Negative for blurred vision and redness.  Respiratory: Negative for cough and shortness of breath.   Cardiovascular: Negative for chest pain, palpitations, orthopnea and PND.  Gastrointestinal: Negative for abdominal pain, diarrhea, nausea and vomiting.  Genitourinary: Negative for dysuria, frequency and urgency.  Musculoskeletal: Positive for falls. Negative for joint pain and myalgias.  Skin: Negative for rash.       No lesions  Neurological: Negative for speech change, focal weakness and weakness.  Endo/Heme/Allergies: Does not bruise/bleed easily.       No temperature intolerance  Psychiatric/Behavioral: Negative for depression and suicidal ideas.    Blood pressure 129/77, pulse 60, temperature 97.6 F (36.4 C), temperature source Oral, resp. rate 17, height 5' 8.75" (1.746 m), weight 89.4 kg, SpO2 99 %. Physical Exam  Vitals reviewed. Constitutional: He is oriented to person, place, and time. He appears well-developed and well-nourished. No distress.  HENT:  Head: Normocephalic and atraumatic.  Mouth/Throat: Oropharynx is clear and moist.  Eyes: Pupils are  equal, round, and reactive to light. Conjunctivae and EOM are normal. No scleral icterus.  Neck: Normal range of motion. Neck supple. No JVD present. No tracheal deviation present. No thyromegaly present.  Cardiovascular: Normal rate, regular rhythm and normal heart sounds. Exam  reveals no gallop and no friction rub.  No murmur heard. Respiratory: Effort normal and breath sounds normal. No respiratory distress.  GI: Soft. Bowel sounds are normal. He exhibits no distension. There is no abdominal tenderness.  Genitourinary:    Genitourinary Comments: Deferred   Musculoskeletal: Normal range of motion.        General: No edema.  Lymphadenopathy:    He has no cervical adenopathy.  Neurological: He is alert and oriented to person, place, and time. No cranial nerve deficit.  Skin: Skin is warm and dry. No rash noted. No erythema.  Psychiatric: He has a normal mood and affect. His behavior is normal. Judgment and thought content normal.     Assessment/Plan This is a 77 year old male admitted for hip fracture. 1.  Hip fracture: Intertrochanteric comminuted fracture of right femur.  Manage severe pain with IV morphine.  Patient is n.p.o. after midnight in anticipation of surgery in the morning.  Moderate to high risk for advanced age.  No cardiovascular risk factors. 2.  Hypertension: Controlled; continue hydrochlorothiazide with triamterene 3.  Atrial fibrillation: Paroxysmal; rate controlled.  Continue to monitor 4.  Essential tremor: Continue primidone and propanolol.  The patient has a deep brain stimulator 5.  BPH: Continue tamsulosin.  Mirabegron for incontinence 6.  DVT prophylaxis: SCDs 7.  GI prophylaxis: H2 blocker per home regimen The patient is a full code.  Time spent on admission orders and patient care approximately 45 minutes  Harrie Foreman, MD 09/15/2018, 10:11 PM

## 2018-09-15 NOTE — ED Notes (Signed)
Pulses intact at this time.

## 2018-09-15 NOTE — ED Notes (Addendum)
Dr. Rudene Christians and Dr. Marcille Blanco at bedside.

## 2018-09-16 ENCOUNTER — Inpatient Hospital Stay: Payer: Medicare Other | Admitting: Anesthesiology

## 2018-09-16 ENCOUNTER — Encounter: Payer: Self-pay | Admitting: Anesthesiology

## 2018-09-16 ENCOUNTER — Inpatient Hospital Stay: Payer: Medicare Other

## 2018-09-16 ENCOUNTER — Encounter
Admission: EM | Disposition: A | Discharge status: Skilled Nursing Facility | Payer: Self-pay | Source: Home / Self Care | Attending: Internal Medicine

## 2018-09-16 HISTORY — PX: INTRAMEDULLARY (IM) NAIL INTERTROCHANTERIC: SHX5875

## 2018-09-16 LAB — HEMOGLOBIN A1C
Hgb A1c MFr Bld: 5.3 % (ref 4.8–5.6)
Mean Plasma Glucose: 105.41 mg/dL

## 2018-09-16 SURGERY — FIXATION, FRACTURE, INTERTROCHANTERIC, WITH INTRAMEDULLARY ROD
Anesthesia: Spinal | Laterality: Right

## 2018-09-16 SURGERY — FIXATION, FRACTURE, INTERTROCHANTERIC, WITH INTRAMEDULLARY ROD
Anesthesia: Choice | Laterality: Right

## 2018-09-16 MED ORDER — SODIUM CHLORIDE 0.9 % IV SOLN
INTRAVENOUS | Status: DC | PRN
  Administered 2018-09-16: 50 ug/min via INTRAVENOUS

## 2018-09-16 MED ORDER — PROPOFOL 10 MG/ML IV BOLUS
INTRAVENOUS | Status: DC | PRN
  Administered 2018-09-16: 30 mg via INTRAVENOUS

## 2018-09-16 MED ORDER — METOCLOPRAMIDE HCL 10 MG PO TABS: 5.0000 mg | ORAL_TABLET | Freq: Three times a day (TID) | ORAL | Status: DC | PRN

## 2018-09-16 MED ORDER — ALUM & MAG HYDROXIDE-SIMETH 200-200-20 MG/5ML PO SUSP: 30.0000 mL | ORAL | Status: DC | PRN

## 2018-09-16 MED ORDER — HYDROCODONE-ACETAMINOPHEN 7.5-325 MG PO TABS: 1.0000 | ORAL_TABLET | ORAL | Status: DC | PRN

## 2018-09-16 MED ORDER — NEOMYCIN-POLYMYXIN B GU 40-200000 IR SOLN
Status: DC | PRN
  Administered 2018-09-16: 2 mL

## 2018-09-16 MED ORDER — EPHEDRINE SULFATE 50 MG/ML IJ SOLN
INTRAMUSCULAR | Status: AC
  Filled 2018-09-16: qty 1

## 2018-09-16 MED ORDER — EPHEDRINE SULFATE 50 MG/ML IJ SOLN
INTRAMUSCULAR | Status: DC | PRN
  Administered 2018-09-16: 50 mg via INTRAVENOUS
  Administered 2018-09-16: 12.5 mg via INTRAVENOUS

## 2018-09-16 MED ORDER — METHOCARBAMOL 1000 MG/10ML IJ SOLN
500.0000 mg | Freq: Four times a day (QID) | INTRAVENOUS | Status: DC | PRN
  Filled 2018-09-16: qty 5

## 2018-09-16 MED ORDER — ENOXAPARIN SODIUM 40 MG/0.4ML ~~LOC~~ SOLN: 40.0000 mg | SUBCUTANEOUS | Status: DC

## 2018-09-16 MED ORDER — KETAMINE HCL 10 MG/ML IJ SOLN
INTRAMUSCULAR | Status: DC | PRN
  Administered 2018-09-16: 40 mg via INTRAVENOUS

## 2018-09-16 MED ORDER — FLUOXETINE HCL 10 MG PO CAPS
10.0000 mg | ORAL_CAPSULE | Freq: Every day | ORAL | Status: DC
  Administered 2018-09-17 – 2018-09-21 (×5): 10 mg via ORAL
  Filled 2018-09-16 (×6): qty 1

## 2018-09-16 MED ORDER — HYDROCODONE-ACETAMINOPHEN 5-325 MG PO TABS
1.0000 | ORAL_TABLET | ORAL | Status: DC | PRN
  Administered 2018-09-16: 1 via ORAL
  Filled 2018-09-16 (×2): qty 1

## 2018-09-16 MED ORDER — BUPIVACAINE HCL (PF) 0.5 % IJ SOLN
INTRAMUSCULAR | Status: AC
  Filled 2018-09-16: qty 10

## 2018-09-16 MED ORDER — ONDANSETRON HCL 4 MG/2ML IJ SOLN
INTRAMUSCULAR | Status: AC
  Filled 2018-09-16: qty 2

## 2018-09-16 MED ORDER — LACTATED RINGERS IV SOLN
INTRAVENOUS | Status: DC | PRN
  Administered 2018-09-16: 13:00:00 via INTRAVENOUS

## 2018-09-16 MED ORDER — FENTANYL CITRATE (PF) 100 MCG/2ML IJ SOLN
INTRAMUSCULAR | Status: AC
  Filled 2018-09-16: qty 2

## 2018-09-16 MED ORDER — SODIUM CHLORIDE 0.9 % IV SOLN
INTRAVENOUS | Status: DC
  Administered 2018-09-16 – 2018-09-18 (×2): via INTRAVENOUS

## 2018-09-16 MED ORDER — TRAMADOL HCL 50 MG PO TABS
50.0000 mg | ORAL_TABLET | Freq: Four times a day (QID) | ORAL | Status: DC
  Administered 2018-09-16 – 2018-09-19 (×9): 50 mg via ORAL
  Filled 2018-09-16 (×10): qty 1

## 2018-09-16 MED ORDER — METOCLOPRAMIDE HCL 5 MG/ML IJ SOLN: 5.0000 mg | Freq: Three times a day (TID) | INTRAMUSCULAR | Status: DC | PRN

## 2018-09-16 MED ORDER — BUPIVACAINE HCL (PF) 0.5 % IJ SOLN
INTRAMUSCULAR | Status: DC | PRN
  Administered 2018-09-16: 2 mL

## 2018-09-16 MED ORDER — CEFAZOLIN SODIUM-DEXTROSE 2-4 GM/100ML-% IV SOLN
2.0000 g | Freq: Four times a day (QID) | INTRAVENOUS | Status: AC
  Administered 2018-09-16 – 2018-09-17 (×3): 2 g via INTRAVENOUS
  Filled 2018-09-16 (×3): qty 100

## 2018-09-16 MED ORDER — MAGNESIUM CITRATE PO SOLN
1.0000 | Freq: Once | ORAL | Status: AC | PRN
  Administered 2018-09-18: 1 via ORAL
  Filled 2018-09-16: qty 296

## 2018-09-16 MED ORDER — PHENOL 1.4 % MT LIQD
1.0000 | OROMUCOSAL | Status: DC | PRN
  Filled 2018-09-16: qty 177

## 2018-09-16 MED ORDER — CEFAZOLIN SODIUM 1 G IJ SOLR
INTRAMUSCULAR | Status: AC
  Filled 2018-09-16: qty 20

## 2018-09-16 MED ORDER — DOCUSATE SODIUM 100 MG PO CAPS
100.0000 mg | ORAL_CAPSULE | Freq: Two times a day (BID) | ORAL | Status: DC
  Administered 2018-09-16 – 2018-09-20 (×9): 100 mg via ORAL
  Filled 2018-09-16 (×9): qty 1

## 2018-09-16 MED ORDER — FENTANYL CITRATE (PF) 100 MCG/2ML IJ SOLN
INTRAMUSCULAR | Status: DC | PRN
  Administered 2018-09-16: 25 ug via INTRAVENOUS

## 2018-09-16 MED ORDER — MAGNESIUM HYDROXIDE 400 MG/5ML PO SUSP
30.0000 mL | Freq: Every day | ORAL | Status: DC | PRN
  Administered 2018-09-17: 30 mL via ORAL
  Filled 2018-09-16 (×2): qty 30

## 2018-09-16 MED ORDER — KETAMINE HCL 50 MG/ML IJ SOLN
INTRAMUSCULAR | Status: AC
  Filled 2018-09-16: qty 10

## 2018-09-16 MED ORDER — FENTANYL CITRATE (PF) 100 MCG/2ML IJ SOLN: 25.0000 ug | INTRAMUSCULAR | Status: DC | PRN

## 2018-09-16 MED ORDER — BISACODYL 10 MG RE SUPP: 10.0000 mg | Freq: Every day | RECTAL | Status: DC | PRN

## 2018-09-16 MED ORDER — ACETAMINOPHEN 500 MG PO TABS
500.0000 mg | ORAL_TABLET | Freq: Four times a day (QID) | ORAL | Status: AC
  Administered 2018-09-16 – 2018-09-17 (×4): 500 mg via ORAL
  Filled 2018-09-16 (×4): qty 1

## 2018-09-16 MED ORDER — PROPOFOL 500 MG/50ML IV EMUL
INTRAVENOUS | Status: AC
  Filled 2018-09-16: qty 50

## 2018-09-16 MED ORDER — PROPOFOL 500 MG/50ML IV EMUL
INTRAVENOUS | Status: DC | PRN
  Administered 2018-09-16: 50 ug/kg/min via INTRAVENOUS

## 2018-09-16 MED ORDER — METHOCARBAMOL 500 MG PO TABS
500.0000 mg | ORAL_TABLET | Freq: Four times a day (QID) | ORAL | Status: DC | PRN
  Administered 2018-09-17: 500 mg via ORAL
  Filled 2018-09-16: qty 1

## 2018-09-16 MED ORDER — MENTHOL 3 MG MT LOZG
1.0000 | LOZENGE | OROMUCOSAL | Status: DC | PRN
  Filled 2018-09-16: qty 9

## 2018-09-16 MED ORDER — ONDANSETRON HCL 4 MG/2ML IJ SOLN
INTRAMUSCULAR | Status: DC | PRN
  Administered 2018-09-16: 4 mg via INTRAVENOUS

## 2018-09-16 MED ORDER — PHENYLEPHRINE HCL 10 MG/ML IJ SOLN
INTRAMUSCULAR | Status: DC | PRN
  Administered 2018-09-16 (×4): 200 ug via INTRAVENOUS

## 2018-09-16 MED ORDER — PROPOFOL 10 MG/ML IV BOLUS
INTRAVENOUS | Status: AC
  Filled 2018-09-16: qty 20

## 2018-09-16 MED ORDER — ONDANSETRON HCL 4 MG/2ML IJ SOLN: 4.0000 mg | Freq: Once | INTRAMUSCULAR | Status: DC | PRN

## 2018-09-16 SURGICAL SUPPLY — 35 items
BIT DRILL 4.3MMS DISTAL GRDTED (BIT) ×1 IMPLANT
CABLE ASSY CERCLAGE SST 1.8X55 (Orthopedic Implant) ×2 IMPLANT
CANISTER SUCT 1200ML W/VALVE (MISCELLANEOUS) ×2 IMPLANT
CHLORAPREP W/TINT 26ML (MISCELLANEOUS) ×2 IMPLANT
CORTICAL BONE SCR 5.0MM X 56MM (Screw) ×2 IMPLANT
COVER WAND RF STERILE (DRAPES) ×2 IMPLANT
DRAPE SHEET LG 3/4 BI-LAMINATE (DRAPES) ×2 IMPLANT
DRAPE U-SHAPE 47X51 STRL (DRAPES) ×2 IMPLANT
DRILL 4.3MMS DISTAL GRADUATED (BIT) ×2
DRSG OPSITE POSTOP 3X4 (GAUZE/BANDAGES/DRESSINGS) ×10 IMPLANT
DRSG OPSITE POSTOP 4X8 (GAUZE/BANDAGES/DRESSINGS) ×2 IMPLANT
GLOVE BIOGEL PI IND STRL 9 (GLOVE) ×1 IMPLANT
GLOVE BIOGEL PI INDICATOR 9 (GLOVE) ×1
GLOVE SURG SYN 9.0  PF PI (GLOVE) ×1
GLOVE SURG SYN 9.0 PF PI (GLOVE) ×1 IMPLANT
GOWN SRG 2XL LVL 4 RGLN SLV (GOWNS) ×1 IMPLANT
GOWN STRL NON-REIN 2XL LVL4 (GOWNS) ×1
GOWN STRL REUS W/ TWL LRG LVL3 (GOWN DISPOSABLE) ×1 IMPLANT
GOWN STRL REUS W/TWL LRG LVL3 (GOWN DISPOSABLE) ×1
GUIDEWIRE BALL NOSE 100CM (WIRE) ×2 IMPLANT
KIT TURNOVER KIT A (KITS) ×2 IMPLANT
MAT ABSORB  FLUID 56X50 GRAY (MISCELLANEOUS) ×1
MAT ABSORB FLUID 56X50 GRAY (MISCELLANEOUS) ×1 IMPLANT
NAIL HIP FRAC RT 130 11MX400M (Nail) ×2 IMPLANT
NEEDLE FILTER BLUNT 18X 1/2SAF (NEEDLE) ×1
NEEDLE FILTER BLUNT 18X1 1/2 (NEEDLE) ×1 IMPLANT
NS IRRIG 500ML POUR BTL (IV SOLUTION) ×2 IMPLANT
PACK HIP COMPR (MISCELLANEOUS) ×2 IMPLANT
SCALPEL PROTECTED #15 DISP (BLADE) ×4 IMPLANT
SCREW HIP FRAC NAIL LAG 130 (Screw) ×2 IMPLANT
STAPLER SKIN PROX 35W (STAPLE) ×2 IMPLANT
SUT ETHIBOND NAB CT1 #1 30IN (SUTURE) ×2 IMPLANT
SUT VIC AB 1 CT1 36 (SUTURE) ×2 IMPLANT
SUT VIC AB 2-0 CT1 (SUTURE) ×2 IMPLANT
SYR 10ML LL (SYRINGE) ×2 IMPLANT

## 2018-09-16 NOTE — Anesthesia Procedure Notes (Signed)
Spinal  Patient location during procedure: OR Start time: 09/16/2018 12:05 PM End time: 09/16/2018 12:13 PM Staffing Resident/CRNA: Clinton Sawyer, CRNA Performed: resident/CRNA  Preanesthetic Checklist Completed: patient identified, site marked, surgical consent, pre-op evaluation, timeout performed, IV checked, risks and benefits discussed and monitors and equipment checked Spinal Block Patient position: sitting Prep: ChloraPrep Patient monitoring: heart rate, continuous pulse ox, blood pressure and cardiac monitor Approach: midline Location: L3-4 Injection technique: single-shot Needle Needle type: [place] and Introducer  Needle gauge: 22 G Needle length: 9 cm Assessment Sensory level: T10 Additional Notes Negative paresthesia. Negative blood return. Positive free-flowing CSF. Expiration date of kit checked and confirmed. Patient tolerated procedure well, without complications.

## 2018-09-16 NOTE — Anesthesia Post-op Follow-up Note (Signed)
Anesthesia QCDR form completed.        

## 2018-09-16 NOTE — Anesthesia Preprocedure Evaluation (Addendum)
Anesthesia Evaluation  Patient identified by MRN, date of birth, ID band Patient awake    Reviewed: Allergy & Precautions, H&P , NPO status , Patient's Chart, lab work & pertinent test results  History of Anesthesia Complications (+) PROLONGED EMERGENCE and history of anesthetic complications  Airway Mallampati: III  TM Distance: >3 FB Neck ROM: limited    Dental  (+) Chipped, Poor Dentition, Missing   Pulmonary neg shortness of breath, sleep apnea ,           Cardiovascular Exercise Tolerance: Good hypertension, (-) angina+ Peripheral Vascular Disease  (-) Past MI + dysrhythmias Atrial Fibrillation      Neuro/Psych  Neuromuscular disease CVA, Residual Symptoms negative psych ROS   GI/Hepatic negative GI ROS, Neg liver ROS,   Endo/Other  diabetes, Type 2  Renal/GU negative Renal ROS Bladder dysfunction      Musculoskeletal  (+) Arthritis ,   Abdominal   Peds negative pediatric ROS (+)  Hematology negative hematology ROS (+)   Anesthesia Other Findings Past Medical History: No date: Arthritis No date: Atrial flutter ([facility]) No date: Diabetes mellitus ([facility]) No date: Essential tremor No date: Hypercholesteremia No date: Hypertension No date: Incontinence No date: 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[place])     Comment:  grew on the testical No date: Sleep apnea No date: Stroke [facility])  Past Surgical History: No date: ABLATION No date: APPENDECTOMY No date: CATARACT EXTRACTION, BILATERAL No date: DEEP BRAIN STIMULATOR PLACEMENT No date: HEMORRHOID SURGERY No date: HERNIA REPAIR No date: NASAL SINUS SURGERY No date: ORCHIECTOMY No date: TONSILLECTOMY  BMI    Body Mass Index:  29.80 kg/m      Reproductive/Obstetrics negative OB ROS                             Anesthesia Physical  Anesthesia Plan  ASA: III and emergent  Anesthesia Plan: Spinal   Post-op Pain Management:    Induction:  Intravenous  PONV Risk Score and Plan: Propofol infusion and TIVA  Airway Management Planned: Nasal Cannula  Additional Equipment:   Intra-op Plan:   Post-operative Plan:   Informed Consent: I have reviewed the patients History and Physical, chart, labs and discussed the procedure including the risks, benefits and alternatives for the proposed anesthesia with the patient or authorized representative who has indicated his/her understanding and acceptance.   Dental Advisory Given  Plan Discussed with: Anesthesiologist, CRNA and Surgeon  Anesthesia Plan Comments: (Patient consented for risks of anesthesia   Patient voiced understanding.)       Anesthesia Quick Evaluation

## 2018-09-16 NOTE — Progress Notes (Signed)
15 minute call to floor. 

## 2018-09-16 NOTE — Op Note (Signed)
09/15/2018 - 09/16/2018  2:03 PM  PATIENT:  Mike Holloway  77 y.o. male  PRE-OPERATIVE DIAGNOSIS:  Right hip fracture subtrochanteric intertrochanteric  POST-OPERATIVE DIAGNOSIS:  Right hip fracture same  PROCEDURE:  Procedure(s): INTRAMEDULLARY (IM) NAIL INTERTROCHANTRIC (Right)  SURGEON: Laurene Footman, MD  ASSISTANTS: None  ANESTHESIA:   spinal  EBL:  Total I/O In: 1000 [I.V.:1000] Out: 850 [Urine:850]  BLOOD ADMINISTERED:none  DRAINS: none   LOCAL MEDICATIONS USED:  NONE  SPECIMEN:  No Specimen  DISPOSITION OF SPECIMEN:  N/A  COUNTS:  YES  TOURNIQUET:  * No tourniquets in log *  IMPLANTS: Biomet affixes 11 x 400 mm right rod with 130 mm leg screw distal interlocking screw and cerclage wire  DICTATION: .Dragon Dictation patient brought the operating room and after adequate anesthesia was obtained the patient was transferred to the fracture table with the left leg in the well-leg holder right foot in the traction boot.  With traction alignment was anatomic and after prepping and draping the usual sterile fashion appropriate patient identification and timeout procedure were completed.  A small incision was then made proximally and a guidewire inserted into the tip of the trochanter and proximal reaming carried out followed by passing of a guidewire making measurements off of this and choosing a 400 mm rod.  Next reaming was carried out to 13 mm and the 11 x 400 mm rod was inserted.  The rod was well seated and then a small lateral incision was made and a guidewire inserted into the head and this measured 130 mm and actually 130 mm screw was somewhat short.  The thing over the guidewire was performed followed by placement of the 130 mm screw although would not advance all the way into the head and was about a centimeter short from the joint surface.  This is true on both AP and lateral images.  This point the setscrew was tightened with a quarter turn to allow for  compression.  The distal interlocking screw was placed without difficulty and because of the complex nature of the fracture an additional cerclage wire was placed this was somewhat difficult was placed up around the lesser trochanter and at the screw insertion site to try to get the 3 main fragments to be to stay adjacent and to allow for early weightbearing after the wire was passed was tightened crimped and then the wire cut the wounds were thoroughly irrigated and the lateral incision at that mid where the wire had been placed and the proximal screw was closed with #1 Vicryl for the deep fascia 2-0 Vicryl subcutaneously and skin staples with 2-0 Vicryl and staples at the other incisions.  PLAN OF CARE: Continue as inpatient  PATIENT DISPOSITION:  PACU - hemodynamically stable.

## 2018-09-16 NOTE — Progress Notes (Signed)
PT Cancellation Note  Patient Details Name: Mike Holloway MRN: [id number] DOB: 1941-03-31   Cancelled Treatment:    Reason Eval/Treat Not Completed: Other (comment).  Order received for "start tomorrow". Will attempt to see pt next date, 12/22.     Collie Siad PT, DPT 09/16/2018, 3:15 PM

## 2018-09-16 NOTE — Progress Notes (Signed)
[facility] [place] NAME: Mike Holloway    MR#:  [id number]  DATE OF BIRTH:  April 29, 1941  SUBJECTIVE:  CHIEF COMPLAINT:  Pt was seen after hip surgery , pain 4-5 /10 Wife and kids bed side  REVIEW OF SYSTEMS:  CONSTITUTIONAL: No fever, fatigue or weakness.  EYES: No blurred or double vision.  EARS, NOSE, AND THROAT: No tinnitus or ear pain.  RESPIRATORY: No cough, shortness of breath, wheezing or hemoptysis.  CARDIOVASCULAR: No chest pain, orthopnea, edema.  GASTROINTESTINAL: No nausea, vomiting, diarrhea or abdominal pain.  GENITOURINARY: No dysuria, hematuria.  ENDOCRINE: No polyuria, nocturia,  HEMATOLOGY: No anemia, easy bruising or bleeding SKIN: No rash or lesion. MUSCULOSKELETAL: Reporting hip pain on the right side NEUROLOGIC: No tingling, numbness, weakness.  PSYCHIATRY: No anxiety or depression.   DRUG ALLERGIES:   Allergies  Allergen Reactions  . Clindamycin Diarrhea    Contracted C. Diff x 2  . Flagyl [Metronidazole] Swelling    Swollen tongue, excessive shaking,    . Ambien  [Zolpidem]     Other reaction(s): Other (See Comments) Disoriented and moody Other reaction(s): Other (See Comments) Disoriented and moody  . Penicillins Rash    Has patient had a PCN reaction causing immediate rash, facial/tongue/throat swelling, SOB or lightheadedness with hypotension: Unknown Has patient had a PCN reaction causing severe rash involving mucus membranes or skin necrosis: Unknown Has patient had a PCN reaction that required hospitalization: No Has patient had a PCN reaction occurring within the last 10 years: No If all of the above answers are "NO", then may proceed with Cephalosporin use.  . Sulfa Antibiotics Rash    VITALS:  Blood pressure 103/67, pulse 60, temperature 97.7 F (36.5 C), temperature source Oral, resp. rate 18, height 5' 8.75" (1.746 m), weight 89.4 kg, SpO2 100 %.  PHYSICAL EXAMINATION:  GENERAL:   77 y.o.-year-old patient lying in the bed with no acute distress.  EYES: Pupils equal, round, reactive to light and accommodation. No scleral icterus. Extraocular muscles intact.  HEENT: Head atraumatic, normocephalic. Oropharynx and nasopharynx clear.  NECK:  Supple, no jugular venous distention. No thyroid enlargement, no tenderness.  LUNGS: Normal breath sounds bilaterally, no wheezing, rales,rhonchi or crepitation. No use of accessory muscles of respiration.  CARDIOVASCULAR: S1, S2 normal. No murmurs, rubs, or gallops.  ABDOMEN: Soft, nontender, nondistended. Bowel sounds present. No organomegaly or mass.  EXTREMITIES: Right hip tender with honeycomb dressing soaked with some blood, no pedal edema, cyanosis, or clubbing.  NEUROLOGIC: Awake, alert and oriented x3 sensation intact. Gait not checked.  PSYCHIATRIC: The patient is alert and oriented x 3.  SKIN: No obvious rash, lesion, or ulcer.    LABORATORY PANEL:   CBC Recent Labs  Lab 09/15/18 1429  WBC 9.1  HGB 12.5*  HCT 36.8*  PLT 135*   ------------------------------------------------------------------------------------------------------------------  Chemistries  Recent Labs  Lab 09/15/18 1429  NA 132*  K 4.0  CL 102  CO2 24  GLUCOSE 123*  BUN 19  CREATININE 1.20  CALCIUM 9.5  AST 19  ALT 13  ALKPHOS 74  BILITOT 0.6   ------------------------------------------------------------------------------------------------------------------  Cardiac Enzymes No results for input(s): TROPONINI in the last 168 hours. ------------------------------------------------------------------------------------------------------------------  RADIOLOGY:  Dg Chest 1 View  Result Date: 09/15/2018 CLINICAL DATA:  Pain following fall EXAM: CHEST  1 VIEW COMPARISON:  None. FINDINGS: There is no edema or consolidation. There is cardiomegaly with pulmonary vascularity normal. No adenopathy. Stimulator is seen on the  left with leads  extending into the left neck region. IMPRESSION: Cardiomegaly.  No edema or consolidation. Electronically Signed   By: Lowella Grip III M.D.   On: 09/15/2018 15:12   Dg Knee 2 Views Right  Result Date: 09/15/2018 CLINICAL DATA:  [place].  Right knee pain. EXAM: RIGHT KNEE - 1-2 VIEW COMPARISON:  Intraoperative spot films from 08/23/2018 FINDINGS: Unicompartmental (patellofemoral) prosthesis without complicating features. No knee fractures identified. Small joint effusion. 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 No acute fracture. Electronically Signed   By: Marijo Sanes M.D.   On: 09/15/2018 15:15   Dg Hip Operative Unilat W Or W/o Pelvis Right  Result Date: 09/16/2018 CLINICAL DATA:  Fixation right hip fracture. EXAM: OPERATIVE right HIP (WITH PELVIS IF PERFORMED) 4 VIEWS TECHNIQUE: Fluoroscopic spot image(s) were submitted for interpretation post-operatively. COMPARISON:  09/15/2018 FINDINGS: Exam demonstrates placement of an intramedullary nail over the right femur with associated screw bridging the femoral neck into the femoral head as hardware is intact. Associated screw over the distal aspect of the right femoral intramedullary nail intact. There is anatomic alignment about the right intertrochanteric fracture. IMPRESSION: Internal fixation of right hip fracture with hardware intact and near anatomic alignment over the fracture site. Electronically Signed   By: Marin Olp M.D.   On: 09/16/2018 15:35   Dg Hip Unilat With Pelvis 2-3 Views Right  Result Date: 09/15/2018 CLINICAL DATA:  [place] at home today. EXAM: DG HIP (WITH OR WITHOUT PELVIS) 2-3V RIGHT COMPARISON:  None. FINDINGS: Displaced intertrochanteric fracture of the right hip with a varus deformity. There is a large butterfly fragment involving the lesser trochanter and also slight subtrochanteric extension. The right hip is normally located. The left hip is intact. The pubic symphysis  and SI joints are intact. IMPRESSION: Displaced and comminuted intertrochanteric fracture of the right hip with a subtrochanteric component. Electronically Signed   By: Marijo Sanes M.D.   On: 09/15/2018 15:13    EKG:   Orders placed or performed during the hospital encounter of 09/15/18  . ED EKG  . ED EKG    ASSESSMENT AND PLAN:   This is a 77 year old male admitted for hip fracture.  1.    Acute right hip fracture: Intertrochanteric comminuted fracture of right femur.  Postop day #0 Status post surgery by Dr. Rudene Christians  Manage severe pain with IV morphine.   Postop care per Ortho  PT consult   2.  Hypertension: Controlled; continue hydrochlorothiazide with triamterene  3.  Atrial fibrillation: Paroxysmal; rate controlled.  Continue to monitor  4.  Essential tremor: Continue primidone and propanolol.  The patient has a deep brain stimulator  5.  BPH: Continue tamsulosin.  Mirabegron for incontinence  6.  DVT prophylaxis: Lovenox subcu  7.  GI prophylaxis: H2 blocker per home regimen  The patient is a full code until [place] determined   All the records are reviewed and case discussed with Care Management/Social Workerr. Management plans discussed with the patient, family and they are in agreement.  CODE STATUS: Full code until CODE STATUS determined   TOTAL TIME TAKING CARE OF THIS PATIENT:36 minutes.   POSSIBLE D/C IN 2 DAYS, DEPENDING ON CLINICAL CONDITION.  Note: This dictation was prepared with Dragon dictation along with smaller phrase technology. Any transcriptional errors that result from this process are unintentional.   Nicholes Mango M.D on 09/16/2018 at 5:29 PM  Between 7am to 6pm - Pager - [phone] After 6pm go to [email or web address] -  password [email or web address] [facility]  [facility]  Office  [phone]  CC: Primary care physician; [name], MD

## 2018-09-16 NOTE — Progress Notes (Signed)
Oral airway. removed by Dr. Kayleen Memos

## 2018-09-16 NOTE — Transfer of Care (Signed)
Immediate Anesthesia Transfer of Care Note  Patient: Mike Holloway  Procedure(s) Performed: INTRAMEDULLARY (IM) NAIL INTERTROCHANTRIC (Right )  Patient Location: PACU  Anesthesia Type:Spinal  Level of Consciousness: sedated  Airway & Oxygen Therapy: Patient Spontanous Breathing and Patient connected to nasal cannula oxygen  Post-op Assessment: Report given to RN and Post -op Vital signs reviewed and stable  Post vital signs: Reviewed and stable  Last Vitals:  Vitals Value Taken Time  BP 105/62 09/16/2018  2:07 PM  Temp 36.8 C 09/16/2018  2:07 PM  Pulse 62 09/16/2018  2:10 PM  Resp 12 09/16/2018  2:10 PM  SpO2 100 % 09/16/2018  2:10 PM  Vitals shown include unvalidated device data.  Last Pain:  Vitals:   09/16/18 1407  TempSrc:   PainSc: Asleep         Complications: No apparent anesthesia complications

## 2018-09-17 LAB — CBC
HCT: 23.2 % — ABNORMAL LOW (ref 39.0–52.0)
Hemoglobin: 7.8 g/dL — ABNORMAL LOW (ref 13.0–17.0)
MCH: 33.9 pg (ref 26.0–34.0)
MCHC: 33.6 g/dL (ref 30.0–36.0)
MCV: 100.9 fL — ABNORMAL HIGH (ref 80.0–100.0)
Platelets: 100 10*3/uL — ABNORMAL LOW (ref 150–400)
RBC: 2.3 MIL/uL — ABNORMAL LOW (ref 4.22–5.81)
RDW: 13.8 % (ref 11.5–15.5)
WBC: 7.1 10*3/uL (ref 4.0–10.5)
nRBC: 0 % (ref 0.0–0.2)

## 2018-09-17 LAB — BASIC METABOLIC PANEL
Anion gap: 5 (ref 5–15)
BUN: 18 mg/dL (ref 8–23)
CO2: 24 mmol/L (ref 22–32)
Calcium: 8.1 mg/dL — ABNORMAL LOW (ref 8.9–10.3)
Chloride: 104 mmol/L (ref 98–111)
Creatinine, Ser: 1.35 mg/dL — ABNORMAL HIGH (ref 0.61–1.24)
GFR calc Af Amer: 59 mL/min — ABNORMAL LOW (ref >60)
GFR calc non Af Amer: 51 mL/min — ABNORMAL LOW (ref >60)
Glucose, Bld: 147 mg/dL — ABNORMAL HIGH (ref 70–99)
Potassium: 3.7 mmol/L (ref 3.5–5.1)
Sodium: 133 mmol/L — ABNORMAL LOW (ref 135–145)

## 2018-09-17 LAB — HEMOGLOBIN AND HEMATOCRIT, BLOOD
HCT: 27.7 % — ABNORMAL LOW (ref 39.0–52.0)
Hemoglobin: 9.2 g/dL — ABNORMAL LOW (ref 13.0–17.0)

## 2018-09-17 LAB — PREPARE RBC (CROSSMATCH)

## 2018-09-17 LAB — ABO/RH: ABO/RH(D): O POS

## 2018-09-17 MED ORDER — FE FUMARATE-B12-VIT C-FA-IFC PO CAPS
1.0000 | ORAL_CAPSULE | Freq: Two times a day (BID) | ORAL | Status: DC
  Administered 2018-09-17 – 2018-09-21 (×7): 1 via ORAL
  Filled 2018-09-17 (×10): qty 1

## 2018-09-17 MED ORDER — SODIUM CHLORIDE 0.9% IV SOLUTION: Freq: Once | INTRAVENOUS | Status: DC

## 2018-09-17 NOTE — Progress Notes (Signed)
[facility] at [place] NAME: Mike Holloway    MR#:  [id number]  DATE OF BIRTH:  08/09/1941  SUBJECTIVE:  CHIEF COMPLAINT:  Pt is doing okay.  Family at bedside.  Hemoglobin dropped down to 7.8 agreeable with blood transfusion  REVIEW OF SYSTEMS:  CONSTITUTIONAL: No fever, fatigue or weakness.  EYES: No blurred or double vision.  EARS, NOSE, AND THROAT: No tinnitus or ear pain.  RESPIRATORY: No cough, shortness of breath, wheezing or hemoptysis.  CARDIOVASCULAR: No chest pain, orthopnea, edema.  GASTROINTESTINAL: No nausea, vomiting, diarrhea or abdominal pain.  GENITOURINARY: No dysuria, hematuria.  ENDOCRINE: No polyuria, nocturia,  HEMATOLOGY: No anemia, easy bruising or bleeding SKIN: No rash or lesion. MUSCULOSKELETAL: Reporting hip pain on the right side NEUROLOGIC: No tingling, numbness, weakness.  PSYCHIATRY: No anxiety or depression.   DRUG ALLERGIES:   Allergies  Allergen Reactions  . Clindamycin Diarrhea    Contracted C. Diff x 2  . Flagyl [Metronidazole] Swelling    Swollen tongue, excessive shaking,    . Ambien  [Zolpidem]     Other reaction(s): Other (See Comments) Disoriented and moody Other reaction(s): Other (See Comments) Disoriented and moody  . Penicillins Rash    Has patient had a PCN reaction causing immediate rash, facial/tongue/throat swelling, SOB or lightheadedness with hypotension: Unknown Has patient had a PCN reaction causing severe rash involving mucus membranes or skin necrosis: Unknown Has patient had a PCN reaction that required hospitalization: No Has patient had a PCN reaction occurring within the last 10 years: No If all of the above answers are "NO", then may proceed with Cephalosporin use.  . Sulfa Antibiotics Rash    VITALS:  Blood pressure 105/62, pulse 95, temperature 98.3 F (36.8 C), temperature source Oral, resp. rate 18, height 5' 8.75" (1.746 m), weight 89.4 kg, SpO2 96  %.  PHYSICAL EXAMINATION:  GENERAL:  77 y.o.-year-old patient lying in the bed with no acute distress.  EYES: Pupils equal, round, reactive to light and accommodation. No scleral icterus. Extraocular muscles intact.  HEENT: Head atraumatic, normocephalic. Oropharynx and nasopharynx clear.  NECK:  Supple, no jugular venous distention. No thyroid enlargement, no tenderness.  LUNGS: Normal breath sounds bilaterally, no wheezing, rales,rhonchi or crepitation. No use of accessory muscles of respiration.  CARDIOVASCULAR: S1, S2 normal. No murmurs, rubs, or gallops.  ABDOMEN: Soft, nontender, nondistended. Bowel sounds present.  EXTREMITIES: Right hip tender with honeycomb dressing soaked with some blood, no pedal edema, cyanosis, or clubbing.  NEUROLOGIC: Awake, alert and oriented x3 sensation intact. Gait not checked.  PSYCHIATRIC: The patient is alert and oriented x 3.  SKIN: No obvious rash, lesion, or ulcer.    LABORATORY PANEL:   CBC Recent Labs  Lab 09/17/18 0505  WBC 7.1  HGB 7.8*  HCT 23.2*  PLT 100*   ------------------------------------------------------------------------------------------------------------------  Chemistries  Recent Labs  Lab 09/15/18 1429 09/17/18 0505  NA 132* 133*  K 4.0 3.7  CL 102 104  CO2 24 24  GLUCOSE 123* 147*  BUN 19 18  CREATININE 1.20 1.35*  CALCIUM 9.5 8.1*  AST 19  --   ALT 13  --   ALKPHOS 74  --   BILITOT 0.6  --    ------------------------------------------------------------------------------------------------------------------  Cardiac Enzymes No results for input(s): TROPONINI in the last 168 hours. ------------------------------------------------------------------------------------------------------------------  RADIOLOGY:  Dg Chest 1 View  Result Date: 09/15/2018 CLINICAL DATA:  Pain following fall EXAM: CHEST  1 VIEW COMPARISON:  None.  FINDINGS: There is no edema or consolidation. There is cardiomegaly with pulmonary  vascularity normal. No adenopathy. Stimulator is seen on the left with leads extending into the left neck region. IMPRESSION: Cardiomegaly.  No edema or consolidation. Electronically Signed   By: Lowella Grip III M.D.   On: 09/15/2018 15:12   Dg Knee 2 Views Right  Result Date: 09/15/2018 CLINICAL DATA:  [place].  Right knee pain. EXAM: RIGHT KNEE - 1-2 VIEW COMPARISON:  Intraoperative spot films from 08/23/2018 FINDINGS: Unicompartmental (patellofemoral) prosthesis without complicating features. No knee fractures identified. Small joint effusion. 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 No acute fracture. Electronically Signed   By: Marijo Sanes M.D.   On: 09/15/2018 15:15   Dg Hip Operative Unilat W Or W/o Pelvis Right  Result Date: 09/16/2018 CLINICAL DATA:  Fixation right hip fracture. EXAM: OPERATIVE right HIP (WITH PELVIS IF PERFORMED) 4 VIEWS TECHNIQUE: Fluoroscopic spot image(s) were submitted for interpretation post-operatively. COMPARISON:  09/15/2018 FINDINGS: Exam demonstrates placement of an intramedullary nail over the right femur with associated screw bridging the femoral neck into the femoral head as hardware is intact. Associated screw over the distal aspect of the right femoral intramedullary nail intact. There is anatomic alignment about the right intertrochanteric fracture. IMPRESSION: Internal fixation of right hip fracture with hardware intact and near anatomic alignment over the fracture site. Electronically Signed   By: Marin Olp M.D.   On: 09/16/2018 15:35   Dg Hip Unilat With Pelvis 2-3 Views Right  Result Date: 09/15/2018 CLINICAL DATA:  [place] at home today. EXAM: DG HIP (WITH OR WITHOUT PELVIS) 2-3V RIGHT COMPARISON:  None. FINDINGS: Displaced intertrochanteric fracture of the right hip with a varus deformity. There is a large butterfly fragment involving the lesser trochanter and also slight subtrochanteric extension.  The right hip is normally located. The left hip is intact. The pubic symphysis and SI joints are intact. IMPRESSION: Displaced and comminuted intertrochanteric fracture of the right hip with a subtrochanteric component. Electronically Signed   By: Marijo Sanes M.D.   On: 09/15/2018 15:13    EKG:   Orders placed or performed during the hospital encounter of 09/15/18  . ED EKG  . ED EKG    ASSESSMENT AND PLAN:   This is a 77 year old male admitted for hip fracture.  1.    Acute right hip fracture: Intertrochanteric comminuted fracture of right femur.  Postop day #1 Status post surgery by Dr. Rudene Christians  Manage severe pain with IV morphine.   Postop care per Ortho  PT consult   2.  Hypertension: Controlled; continue hydrochlorothiazide with triamterene  3.  Atrial fibrillation: Paroxysmal; rate controlled.  Continue to monitor  4.  Essential tremor: Continue primidone and propanolol.  The patient has a deep brain stimulator  5.  BPH: Continue tamsulosin.  Mirabegron for incontinence  6.  DVT prophylaxis: Lovenox subcu  7.  GI prophylaxis: H2 blocker per home regimen  The patient is a full code until [place] determined   All the records are reviewed and case discussed with Care Management/Social Workerr. Management plans discussed with the patient, family and they are in agreement.  CODE STATUS: Full code until CODE STATUS determined   TOTAL TIME TAKING CARE OF THIS PATIENT:36 minutes.   POSSIBLE D/C IN 2 DAYS, DEPENDING ON CLINICAL CONDITION.  Note: This dictation was prepared with Dragon dictation along with smaller phrase technology. Any transcriptional errors that result from this process are unintentional.   Illene Silver  Liset Mcmonigle M.D on 09/17/2018 at 1:37 PM  Between 7am to 6pm - Pager - [phone] After 6pm go to [email or web address] - password [email or web address] [facility]  [place] [facility]  Office  [phone]  CC: Primary care physician; [name], MD

## 2018-09-17 NOTE — Anesthesia Postprocedure Evaluation (Signed)
Anesthesia Post Note  Patient: Mike Holloway  Procedure(s) Performed: INTRAMEDULLARY (IM) NAIL INTERTROCHANTRIC (Right )  Patient location during evaluation: Other Anesthesia Type: Spinal Level of consciousness: awake and alert and oriented Pain management: pain level controlled Vital Signs Assessment: post-procedure vital signs reviewed and stable Respiratory status: spontaneous breathing Cardiovascular status: blood pressure returned to baseline Anesthetic complications: no     Last Vitals:  Vitals:   09/17/18 0922 09/17/18 1403  BP: 105/62 106/61  Pulse: 95 87  Resp: 18 18  Temp: 36.8 C 37.2 C  SpO2: 96% 95%    Last Pain:  Vitals:   09/17/18 1403  TempSrc: Oral  PainSc: 2                  [name],[name]

## 2018-09-17 NOTE — Clinical Social Work Note (Signed)
Clinical Social Work Assessment  Patient Details  Name: Mike Holloway MRN: [id number] Date of Birth: [birth year]  Date of referral:  09/17/18               Reason for consult:  Facility Placement                Permission sought to share information with:  Facility Sport and exercise psychologist Permission granted to share information::  Yes, Verbal Permission Granted  Name::        Agency::  All listed SNFs on post-acute care list for [facility] provided by the [place].  Relationship::     Contact Information:     Housing/Transportation Living arrangements for the past 2 months:  [place] of Information:  Patient, Medical Team Patient Interpreter Needed:  None Criminal Activity/Legal Involvement Pertinent to Current Situation/Hospitalization:  No - Comment as needed Significant Relationships:  Spouse, Community Support Lives with:  Spouse Do you feel safe going back to the place where you live?  Yes Need for family participation in patient care:  No (Coment)  Care giving concerns:  PT recommendation for SNF   Social Worker assessment / plan:  CSW met with the patient at bedside to discuss discharge planning. The patient provided verbal consent to begin the referral process, and the CSW provided the CMS.gov list of facilities in the area. The patient shared that he would prefer a facility closer to his home in [place]. The CSW explained the Medicare guidelines for payment.   The CSW has begun the referral process and received the patient's PASRR. The CSW will follow up with bed offers as they become available. The patient will most likely discharge to the facility of his choice Tuesday or Wednesday if medically stable. CSW will follow for discharge facilitation.  Employment status:  Retired Forensic scientist:  [employer] PT Recommendations:  [place] / Referral to community resources:  [place]  Patient/Family's  Response to care: The patient thanked the CSW.  Patient/Family's Understanding of and Emotional Response to Diagnosis, Current Treatment, and Prognosis:  The patient understands the level of care recommended and is in agreement with the discharge plan.  Emotional Assessment Appearance:  Appears stated age Attitude/Demeanor/Rapport:  Gracious, Engaged Affect (typically observed):  Pleasant, Appropriate, Stable Orientation:  Oriented to Self, Oriented to Place, Oriented to  Time, Oriented to Situation Alcohol / Substance use:  Never Used Psych involvement (Current and /or in the community):  No (Comment)  Discharge Needs  Concerns to be addressed:  Discharge Planning Concerns, Care Coordination Readmission within the last 30 days:  No Current discharge risk:  Physical Impairment Barriers to Discharge:  Continued Medical Work up   [employer], LCSW 09/17/2018, 3:58 PM

## 2018-09-17 NOTE — Progress Notes (Signed)
Pts bladder scan 798 ml. MD Gouru notified. Orders received for in and out X1.

## 2018-09-17 NOTE — Evaluation (Signed)
Physical Therapy Evaluation Patient Details Name: Mike Holloway MRN: [id number] DOB: 01-May-1941 Today's Date: 09/17/2018   History of Present Illness  77 yo male with onset of fracture falling on porch picking up a package from [place] was admitted with R intertrochanteric fracture and now with IM nailing 09/16/18.  PMHx:  cervical spine stimulator, deep brain stim, sleep apnea, lymphoma, DM, PN, tremors, a-flutter, stroke,   Clinical Impression  Pt was seen for initial mobility with encouraging start to stand with support of L knee bedside x 3 and got more upright last attempt after being unable to stand with RW.  Pt is back to bed repositioned and instructed on ROM to increase hip and ankle postures and talked with him about progression of rehab.  Wife in attendance for all therapy and able to fill PT in on his history of difficulty using LUE, therefore a platform walker is in room to see if that gives him a bit more control of standing when ready for it.  Follow up acutely until dc to rehab.    Follow Up Recommendations SNF    Equipment Recommendations  None recommended by PT    Recommendations for Other Services       Precautions / Restrictions Precautions Precautions: Fall(deep brain stim, c-spine stimulator) Precaution Comments: pt may not have the devices running with stim, unclear from conversation with wife Restrictions Weight Bearing Restrictions: Yes RLE Weight Bearing: Weight bearing as tolerated      Mobility  Bed Mobility Overal bed mobility: Needs Assistance Bed Mobility: Supine to Sit;Sit to Supine     Supine to sit: Mod assist Sit to supine: Max assist   General bed mobility comments: mod assist to scoot out trunk and pivot with legs, back with more controlled pivot sitting to supine  Transfers Overall transfer level: Needs assistance Equipment used: Rolling walker (2 wheeled);1 person hand held assist Transfers: Sit to/from Stand Sit to Stand: Max  assist;Mod assist;From elevated surface         General transfer comment: attempted with no success from walker so with PT directly assisting to stand was able to partially stand with WB on LLE mainly  Ambulation/Gait             General Gait Details: unable to step with PT  Stairs            Wheelchair Mobility    Modified Rankin (Stroke Patients Only)       Balance                                             Pertinent Vitals/Pain Pain Assessment: 0-10 Pain Score: 7  Pain Location: R hip  Pain Descriptors / Indicators: Operative site guarding Pain Intervention(s): Limited activity within patient's tolerance;Monitored during session;Premedicated before session;Repositioned;Ice applied    Home Living Family/patient expects to be discharged to:: Skilled nursing facility Living Arrangements: Spouse/significant other               Additional Comments: has stairs to enter house    Prior Function Level of Independence: Independent with assistive device(s)         Comments: cane on R hand due to tremors and control issues of L hand     Hand Dominance   Dominant Hand: Right    Extremity/Trunk Assessment   Upper Extremity Assessment Upper Extremity Assessment: Generalized weakness  Lower Extremity Assessment Lower Extremity Assessment: Generalized weakness;RLE deficits/detail RLE Deficits / Details: new R femoral IM nailing RLE: (post op weakness and pain) RLE Coordination: decreased fine motor;decreased gross motor    Cervical / Trunk Assessment Cervical / Trunk Assessment: Other exceptions(previous cervical spine fusion with stimulator)  Communication   Communication: No difficulties  Cognition Arousal/Alertness: Awake/alert Behavior During Therapy: WFL for tasks assessed/performed Overall Cognitive Status: Within Functional Limits for tasks assessed                                        General  Comments General comments (skin integrity, edema, etc.): clean incision on R hip with ice applied before and after visit    Exercises General Exercises - Lower Extremity Ankle Circles/Pumps: AROM;Both;5 reps(holding stretches)   Assessment/Plan    PT Assessment Patient needs continued PT services  PT Problem List Decreased strength;Decreased range of motion;Decreased activity tolerance;Decreased balance;Decreased mobility;Decreased coordination;Decreased knowledge of use of DME;Decreased safety awareness;Decreased skin integrity;Pain       PT Treatment Interventions DME instruction;Gait training;Stair training;Functional mobility training;Therapeutic activities;Therapeutic exercise;Balance training;Neuromuscular re-education;Patient/family education    PT Goals (Current goals can be found in the Care Plan section)  Acute Rehab PT Goals Patient Stated Goal: to get stronger  PT Goal Formulation: With patient/family Time For Goal Achievement: 10/01/18 Potential to Achieve Goals: Good    Frequency BID   Barriers to discharge Inaccessible home environment;Decreased caregiver support home with stairs with wife there alone    Co-evaluation               AM-PAC PT "6 Clicks" Mobility  Outcome Measure Help needed turning from your back to your side while in a flat bed without using bedrails?: A Lot Help needed moving from lying on your back to sitting on the side of a flat bed without using bedrails?: A Lot Help needed moving to and from a bed to a chair (including a wheelchair)?: Total Help needed standing up from a chair using your arms (e.g., wheelchair or bedside chair)?: A Lot Help needed to walk in hospital room?: Total Help needed climbing 3-5 steps with a railing? : Total 6 Click Score: 9    End of Session Equipment Utilized During Treatment: Gait belt Activity Tolerance: Patient limited by pain;Patient limited by fatigue Patient left: in bed;with call bell/phone  within reach;with bed alarm set;with family/visitor present;with nursing/sitter in room Nurse Communication: Mobility status PT Visit Diagnosis: Unsteadiness on feet (R26.81);Muscle weakness (generalized) (M62.81);Difficulty in walking, not elsewhere classified (R26.2);Pain Pain - Right/Left: Right Pain - part of body: Hip    Time: 2774-1287(8676 to 1035) PT Time Calculation (min) (ACUTE ONLY): 41 min   Charges:   PT Evaluation $PT Eval Moderate Complexity: 1 Mod PT Treatments $Therapeutic Exercise: 8-22 mins $Therapeutic Activity: 23-37 mins       Ramond Dial 09/17/2018, 12:25 PM   Mee Hives, PT MS Acute Rehab Dept. Number: [place] and [place]

## 2018-09-17 NOTE — Progress Notes (Addendum)
   Subjective: 1 Day Post-Op Procedure(s) (LRB): INTRAMEDULLARY (IM) NAIL INTERTROCHANTRIC (Right) Patient reports pain as mild.   Patient is well, and has had no acute complaints or problems Denies any CP, SOB, ABD pain. We will star physical therapy today.   Objective: Vital signs in last 24 hours: Temp:  [97.5 F (36.4 C)-98.9 F (37.2 C)] 98 F (36.7 C) (12/22 0404) Pulse Rate:  [58-90] 90 (12/22 0404) Resp:  [12-19] 19 (12/22 0404) BP: (96-139)/(52-73) 104/60 (12/22 0404) SpO2:  [94 %-100 %] 95 % (12/22 0404)  Intake/Output from previous day: 12/21 0701 - 12/22 0700 In: 1722.2 [I.V.:1522.2; IV Piggyback:200] Out: 1250 [Urine:1150; Blood:100] Intake/Output this shift: No intake/output data recorded.  Recent Labs    09/15/18 1429 09/17/18 0505  HGB 12.5* 7.8*   Recent Labs    09/15/18 1429 09/17/18 0505  WBC 9.1 7.1  RBC 3.75* 2.30*  HCT 36.8* 23.2*  PLT 135* 100*   Recent Labs    09/15/18 1429 09/17/18 0505  NA 132* 133*  K 4.0 3.7  CL 102 104  CO2 24 24  BUN 19 18  CREATININE 1.20 1.35*  GLUCOSE 123* 147*  CALCIUM 9.5 8.1*   Recent Labs    09/15/18 1429  INR 0.99    EXAM General - Patient is Alert, Appropriate and Oriented Extremity - Neurovascular intact Sensation intact distally Intact pulses distally Dorsiflexion/Plantar flexion intact No cellulitis present Compartment soft Dressing - moderate drainage and new dressing applied.  No active drainage. Motor Function - intact, moving foot and toes well on exam.   Past Medical History:  Diagnosis Date  . Arthritis   . Atrial flutter ([place])   . Diabetes mellitus ([place])   . Essential tremor   . Essential tremor    deep brain stimulator   . Hypercholesteremia   . Hypertension   . Incontinence   . 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 Sleep apnea   . Stroke [facility])     Assessment/Plan:   1 Day Post-Op Procedure(s) (LRB): INTRAMEDULLARY (IM) NAIL INTERTROCHANTRIC  (Right) Active Problems:   Hip fracture (HCC)   Acute post op blood loss anemia   Estimated body mass index is 29.3 kg/m as calculated from the following:   Height as of this encounter: 5' 8.75" (1.746 m).   Weight as of this encounter: 89.4 kg. Up with therapy, weightbearing as tolerated right lower extremity  Acute post op blood loss anemia -hemoglobin 7.8.  Transfuse 1 unit packed red blood cells today and recheck hemoglobin in the morning.  Thrombocytopenia -hold Lovenox today.  Restart Lovenox tomorrow  Work on having bowel movement  Care management to assist with discharge    Patient will need follow-up with [facility] orthopedics in 2 weeks for staple removal Steri-Strip application. Lovenox 40 mg subcu daily x14 days at discharge TED hose bilateral lower extremities x6 weeks, remove at night   DVT Prophylaxis - Lovenox, TED hose and SCDs Weight-Bearing as tolerated to right leg   T. Rachelle Hora, PA-C [place] 09/17/2018, 9:03 AM

## 2018-09-17 NOTE — NC FL2 (Addendum)
[place] LEVEL OF CARE SCREENING TOOL     IDENTIFICATION  Patient Name: Mike Holloway Birthdate: [birth year] Sex: male Admission Date (Current Location): 09/15/2018  [place] and [place] Number:  Engineering geologist and Address:  [facility], [address] [place], [place] [address]      Provider Number: [id number]  Attending Physician Name and Address:  Nicholes Mango, MD  Relative Name and Phone Number:  [facility]) [phone]    Current Level of Care: Hospital Recommended Level of Care: [place] Prior Approval Number:    Date Approved/Denied:   PASRR Number: [id number]  Discharge Plan: SNF    Current Diagnoses: Patient Active Problem List   Diagnosis Date Noted  . Hip fracture ([place]) 09/15/2018  . 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 HLD (hyperlipidemia) 03/24/2018  . Contusion of right knee 02/14/2018  . Bradycardia 12/28/2017  . 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 Chronic pain of right knee 08/10/2016  . Right ankle pain 08/10/2016  . Venous insufficiency of both lower extremities 05/13/2016  . 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[place]) 12/11/2015  . 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 Lymphoma, 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[place]) 04/03/2015  . Breathlessness on exertion 11/21/2014  . Breath shortness 11/21/2014  . Arthropathy 11/07/2014  . Atrial flutter, paroxysmal ([place]) 11/07/2014  . Type 2 diabetes mellitus ([place]) 11/07/2014  . Benign essential tremor 11/07/2014  . Benign essential HTN 11/07/2014  . Mixed incontinence 11/07/2014  . Hypercholesterolemia without hypertriglyceridemia 11/07/2014  . Apnea, sleep 11/07/2014  . Controlled type 2 diabetes mellitus without complication ([place]) 26/20/3559  . Pure hypercholesterolemia 11/07/2014  . Other abnormalities of gait and mobility 11/02/2011  . Abnormal gait 06/29/2011  . Discoordination 06/29/2011    Orientation  RESPIRATION BLADDER Height & Weight     Self, Time, Situation, Place  Normal Incontinent Weight: 197 lb (89.4 kg) Height:  5' 8.75" (174.6 cm)  BEHAVIORAL SYMPTOMS/MOOD NEUROLOGICAL BOWEL NUTRITION STATUS      Continent    AMBULATORY STATUS COMMUNICATION OF NEEDS Skin   Extensive Assist Verbally Surgical wounds                       Personal Care Assistance Level of Assistance  Bathing, Feeding, Dressing Bathing Assistance: Limited assistance Feeding assistance: Independent Dressing Assistance: Limited assistance     Functional Limitations Info  Sight, Hearing, Speech Sight Info: Adequate Hearing Info: Adequate Speech Info: Adequate    SPECIAL CARE FACTORS FREQUENCY  PT (By licensed PT), OT (By licensed OT)     PT Frequency: Up to 5X per week OT Frequency: Up to 3X per week            Contractures Contractures Info: Not present    Additional Factors Info  Code Status, Allergies Code Status Info: Full Allergies Info: Clindamycin, Flagyl Metronidazole, Ambien  Zolpidem, Penicillins, Sulfa Antibiotics           Current Medications (09/17/2018):  This is the current hospital active medication list Current Facility-Administered Medications  Medication Dose Route Frequency Provider Last Rate Last Dose  . 0.9 %  sodium chloride infusion (Manually program via Guardrails IV Fluids)   Intravenous Once Gouru, Aruna, MD      . 0.9 %  sodium chloride infusion   Intravenous Continuous Hessie Knows, MD   Stopped at 09/16/18 1301  . 0.9 %  sodium chloride infusion   Intravenous Continuous Hessie Knows, MD 75 mL/hr at 09/16/18 1521    .  acetaminophen (TYLENOL) tablet 650 mg  650 mg Oral Q6H PRN Hessie Knows, MD       Or  . acetaminophen (TYLENOL) suppository 650 mg  650 mg Rectal Q6H PRN Hessie Knows, MD      . alum & mag hydroxide-simeth (MAALOX/MYLANTA) 200-200-20 MG/5ML suspension 30 mL  30 mL Oral Q4H PRN Hessie Knows, MD      . bisacodyl (DULCOLAX) suppository 10  mg  10 mg Rectal Daily PRN Hessie Knows, MD      . cholecalciferol (VITAMIN D3) tablet 1,000 Units  1,000 Units Oral QHS Hessie Knows, MD   1,000 Units at 09/16/18 2112  . docusate sodium (COLACE) capsule 100 mg  100 mg Oral BID Hessie Knows, MD   100 mg at 09/17/18 0925  . famotidine (PEPCID) tablet 20 mg  20 mg Oral BID Hessie Knows, MD   20 mg at 09/17/18 0925  . ferrous [id number] C-folic acid (TRINSICON / FOLTRIN) capsule 1 capsule  1 capsule Oral BID PC Hessie Knows, MD   1 capsule at 09/17/18 0924  . FLUoxetine (PROZAC) capsule 10 mg  10 mg Oral Daily Hessie Knows, MD   10 mg at 09/17/18 0925  . HYDROcodone-acetaminophen (NORCO) 7.5-325 MG per tablet 1-2 tablet  1-2 tablet Oral Q4H PRN Hessie Knows, MD      . HYDROcodone-acetaminophen (NORCO/VICODIN) 5-325 MG per tablet 1-2 tablet  1-2 tablet Oral Q4H PRN Hessie Knows, MD   1 tablet at 09/16/18 2124  . magnesium citrate solution 1 Bottle  1 Bottle Oral Once PRN Hessie Knows, MD      . magnesium hydroxide (MILK OF MAGNESIA) suspension 30 mL  30 mL Oral Daily PRN Hessie Knows, MD   30 mL at 09/17/18 0924  . menthol-cetylpyridinium (CEPACOL) lozenge 3 mg  1 lozenge Oral PRN Hessie Knows, MD       Or  . phenol (CHLORASEPTIC) mouth spray 1 spray  1 spray Mouth/Throat PRN Hessie Knows, MD      . methocarbamol (ROBAXIN) tablet 500 mg  500 mg Oral Q6H PRN Hessie Knows, MD   500 mg at 09/17/18 1132   Or  . methocarbamol (ROBAXIN) 500 mg in dextrose 5 % 50 mL IVPB  500 mg Intravenous Q6H PRN Hessie Knows, MD      . metoCLOPramide (REGLAN) tablet 5-10 mg  5-10 mg Oral Q8H PRN Hessie Knows, MD       Or  . metoCLOPramide (REGLAN) injection 5-10 mg  5-10 mg Intravenous Q8H PRN Hessie Knows, MD      . mirabegron ER [facility]) tablet 50 mg  50 mg Oral Daily Hessie Knows, MD   50 mg at 09/17/18 8413  . morphine 2 MG/ML injection 2-4 mg  2-4 mg Intravenous Q4H PRN Hessie Knows, MD   2 mg at 09/16/18 0424  . mupirocin ointment  (BACTROBAN) 2 % 1 application  1 application Nasal BID Hessie Knows, MD   1 application at 24/40/10 [phone]  . ondansetron (ZOFRAN) tablet 4 mg  4 mg Oral Q6H PRN Hessie Knows, MD       Or  . ondansetron [facility]) injection 4 mg  4 mg Intravenous Q6H PRN Hessie Knows, MD   4 mg at 09/16/18 0748  . primidone (MYSOLINE) tablet 100 mg  100 mg Oral q morning - 10a Hessie Knows, MD   100 mg at 09/17/18 0925  . primidone (MYSOLINE) tablet 50 mg  50 mg Oral QHS Hessie Knows, MD   50 mg at  09/16/18 2112  . propranolol ER (INDERAL LA) 24 hr capsule 160 mg  160 mg Oral Daily Hessie Knows, MD   160 mg at 09/17/18 0926  . tamsulosin (FLOMAX) capsule 0.4 mg  0.4 mg Oral Daily Hessie Knows, MD   0.4 mg at 09/17/18 0925  . traMADol (ULTRAM) tablet 50 mg  50 mg Oral Q6H Hessie Knows, MD   50 mg at 09/17/18 1132  . triamterene-hydrochlorothiazide (MAXZIDE-25) 37.5-25 MG per tablet 1 tablet  1 tablet Oral Daily Hessie Knows, MD   1 tablet at 09/17/18 4037     Discharge Medications: Please see discharge summary for a list of discharge medications.  Relevant Imaging Results:  Relevant Lab Results:   Additional Information [id number]  Zettie Pho, LCSW

## 2018-09-18 ENCOUNTER — Encounter: Payer: Self-pay | Admitting: Orthopedic Surgery

## 2018-09-18 LAB — BPAM RBC
Blood Product Expiration Date: 201912242359
ISSUE DATE / TIME: 201912221409
Unit Type and Rh: 9500

## 2018-09-18 LAB — CBC
HCT: 24.6 % — ABNORMAL LOW (ref 39.0–52.0)
Hemoglobin: 8.2 g/dL — ABNORMAL LOW (ref 13.0–17.0)
MCH: 32.7 pg (ref 26.0–34.0)
MCHC: 33.3 g/dL (ref 30.0–36.0)
MCV: 98 fL (ref 80.0–100.0)
Platelets: 86 10*3/uL — ABNORMAL LOW (ref 150–400)
RBC: 2.51 MIL/uL — ABNORMAL LOW (ref 4.22–5.81)
RDW: 17.8 % — ABNORMAL HIGH (ref 11.5–15.5)
WBC: 8.3 10*3/uL (ref 4.0–10.5)
nRBC: 0 % (ref 0.0–0.2)

## 2018-09-18 LAB — TYPE AND SCREEN
ABO/RH(D): O POS
Antibody Screen: NEGATIVE
Unit division: 0

## 2018-09-18 MED ORDER — MAGNESIUM CITRATE PO SOLN
1.0000 | Freq: Once | ORAL | Status: DC | PRN
  Filled 2018-09-18: qty 296

## 2018-09-18 NOTE — Progress Notes (Signed)
Physical Therapy Treatment Patient Details Name: Shawn Carattini MRN: [id number] DOB: May 12, 1941 Today's Date: 09/18/2018    History of Present Illness 77 yo male with onset of fracture falling on porch picking up a package from [place] was admitted with R intertrochanteric fracture and now with IM nailing 09/16/18.  PMHx:  cervical spine stimulator, deep brain stim, sleep apnea, lymphoma, DM, PN, tremors, a-flutter, stroke,     PT Comments    Pt agreeable to PT for supine bed exercises. Pt notes R hip pain 5/10; pt complains of fatigue. Pt/spouse educated on and pt performs exercises with assist throughout RLE. Written program provided and all questions answered. Continue PT to progress strength, range and endurance to improve all functional mobility.    Follow Up Recommendations  SNF     Equipment Recommendations  None recommended by PT    Recommendations for Other Services       Precautions / Restrictions Precautions Precautions: Fall Restrictions Weight Bearing Restrictions: Yes RLE Weight Bearing: Weight bearing as tolerated    Mobility  Bed Mobility               General bed mobility comments: Not tested  Transfers                    Ambulation/Gait                 Stairs             Wheelchair Mobility    Modified Rankin (Stroke Patients Only)       Balance                                            Cognition Arousal/Alertness: (Fatigued) Behavior During Therapy: WFL for tasks assessed/performed Overall Cognitive Status: Within Functional Limits for tasks assessed                                        Exercises General Exercises - Lower Extremity Ankle Circles/Pumps: AROM;Both;15 reps;Supine Quad Sets: Strengthening;Both;10 reps;Supine Gluteal Sets: Strengthening;Both;10 reps;Supine Short Arc Quad: AAROM;Right;10 reps;Supine(L AROM) Heel Slides: AAROM;Both;10 reps;Supine Hip  ABduction/ADduction: AAROM;Both;10 reps;Supine Other Exercises Other Exercises: Pt/spouse educated on exercises and assist and given HEP    General Comments        Pertinent Vitals/Pain Pain Assessment: 0-10 Pain Score: 5  Pain Location: R hip  Pain Intervention(s): Limited activity within patient's tolerance    Home Living                      Prior Function            PT Goals (current goals can now be found in the care plan section) Progress towards PT goals: Progressing toward goals    Frequency    BID      PT Plan Current plan remains appropriate    Co-evaluation              AM-PAC PT "6 Clicks" Mobility   Outcome Measure  Help needed turning from your back to your side while in a flat bed without using bedrails?: A Lot Help needed moving from lying on your back to sitting on the side of a flat bed without using bedrails?: A Lot Help needed moving  to and from a bed to a chair (including a wheelchair)?: Total Help needed standing up from a chair using your arms (e.g., wheelchair or bedside chair)?: Total Help needed to walk in hospital room?: Total Help needed climbing 3-5 steps with a railing? : Total 6 Click Score: 8    End of Session   Activity Tolerance: Patient limited by fatigue Patient left: in bed;with call bell/phone within reach;with bed alarm set;with SCD's reapplied   PT Visit Diagnosis: Unsteadiness on feet (R26.81);Muscle weakness (generalized) (M62.81);Difficulty in walking, not elsewhere classified (R26.2);Pain Pain - Right/Left: Right Pain - part of body: Hip     Time: 1525-1600 PT Time Calculation (min) (ACUTE ONLY): 35 min  Charges:  $Therapeutic Exercise: 23-37 mins                      Larae Grooms, PTA 09/18/2018, 4:41 PM

## 2018-09-18 NOTE — Progress Notes (Signed)
[facility] NAME: Mike Holloway    MR#:  [id number]  DATE OF BIRTH:  Dec 20, 1940  SUBJECTIVE:   patient with decreased UOP  Hx of low PLTS reprots wife  REVIEW OF SYSTEMS:    Review of Systems  Constitutional: Negative for fever, chills weight loss HENT: Negative for ear pain, nosebleeds, congestion, facial swelling, rhinorrhea, neck pain, neck stiffness and ear discharge.   Respiratory: Negative for cough, shortness of breath, wheezing  Cardiovascular: Negative for chest pain, palpitations and leg swelling.  Gastrointestinal: Negative for heartburn, abdominal pain, vomiting, diarrhea or consitpation Genitourinary: Negative for dysuria, urgency, frequency, hematuria Musculoskeletal: Negative for back pain or ++joint pain Neurological: Negative for dizziness, seizures, syncope, focal weakness,  numbness and headaches.  Hematological: Does not bruise/bleed easily.  Psychiatric/Behavioral: Negative for hallucinations, confusion, dysphoric mood    Tolerating Diet: yes      DRUG ALLERGIES:   Allergies  Allergen Reactions  . Clindamycin Diarrhea    Contracted C. Diff x 2  . Flagyl [Metronidazole] Swelling    Swollen tongue, excessive shaking,    . Ambien  [Zolpidem]     Other reaction(s): Other (See Comments) Disoriented and moody Other reaction(s): Other (See Comments) Disoriented and moody  . Penicillins Rash    Has patient had a PCN reaction causing immediate rash, facial/tongue/throat swelling, SOB or lightheadedness with hypotension: Unknown Has patient had a PCN reaction causing severe rash involving mucus membranes or skin necrosis: Unknown Has patient had a PCN reaction that required hospitalization: No Has patient had a PCN reaction occurring within the last 10 years: No If all of the above answers are "NO", then may proceed with Cephalosporin use.  . Sulfa Antibiotics Rash    VITALS:  Blood pressure 108/62,  pulse 75, temperature 98.6 F (37 C), temperature source Oral, resp. rate 19, height 5' 8.75" (1.746 m), weight 89.4 kg, SpO2 95 %.  PHYSICAL EXAMINATION:  Constitutional: Appears well-developed and well-nourished. No distress. HENT: Normocephalic. [name] Oropharynx is clear and moist.  Eyes: Conjunctivae and EOM are normal. PERRLA, no scleral icterus.  Neck: Normal ROM. Neck supple. No JVD. No tracheal deviation. CVS: RRR, S1/S2 +, no murmurs, no gallops, no carotid bruit.  Pulmonary: Effort and breath sounds normal, no stridor, rhonchi, wheezes, rales.  Abdominal: Soft. BS +,  no distension, tenderness, rebound or guarding.  Musculoskeletal: Normal range of motion. No edema and no tenderness.  Neuro: Alert. CN 2-12 grossly intact. No focal deficits. Skin: Skin is warm and dry. No rash noted. Psychiatric: Normal mood and affect.      LABORATORY PANEL:   CBC Recent Labs  Lab 09/18/18 0316  WBC 8.3  HGB 8.2*  HCT 24.6*  PLT 86*   ------------------------------------------------------------------------------------------------------------------  Chemistries  Recent Labs  Lab 09/15/18 1429 09/17/18 0505  NA 132* 133*  K 4.0 3.7  CL 102 104  CO2 24 24  GLUCOSE 123* 147*  BUN 19 18  CREATININE 1.20 1.35*  CALCIUM 9.5 8.1*  AST 19  --   ALT 13  --   ALKPHOS 74  --   BILITOT 0.6  --    ------------------------------------------------------------------------------------------------------------------  Cardiac Enzymes No results for input(s): TROPONINI in the last 168 hours. ------------------------------------------------------------------------------------------------------------------  RADIOLOGY:  Dg Hip Operative Unilat W Or W/o Pelvis Right  Result Date: 09/16/2018 CLINICAL DATA:  Fixation right hip fracture. EXAM: OPERATIVE right HIP (WITH PELVIS IF PERFORMED) 4 VIEWS TECHNIQUE: Fluoroscopic spot image(s) were submitted for interpretation  post-operatively.  COMPARISON:  09/15/2018 FINDINGS: Exam demonstrates placement of an intramedullary nail over the right femur with associated screw bridging the femoral neck into the femoral head as hardware is intact. Associated screw over the distal aspect of the right femoral intramedullary nail intact. There is anatomic alignment about the right intertrochanteric fracture. IMPRESSION: Internal fixation of right hip fracture with hardware intact and near anatomic alignment over the fracture site. Electronically Signed   By: Marin Olp M.D.   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   1.  Intertrochanteric commuted fracture of right femur: Patient is postoperative day #2 With postop complication including anemia requiring blood transfusion Platelets are low and therefore Lovenox should be on hold for now Continue monitor CBC   2.  Chronic thrombocytopenia: Continue to monitor Would not use Lovenox for DVT prophylaxis due to thrombocytopenia  3.  Acute on chronic blood loss anemia from surgery: Continue ferrous fumarate and monitor CBC  4.  Depression: Continue Prozac  5.  Essential tremor: Continue primidone and propanolol  6.  BPH: Continue Flomax  7.  Essential hypertension: Blood pressure is low normal and therefore I have discontinued diuretic in addition he has decreased urine output and his urine is dark and needs some IV fluids.   Management plans discussed with the patient and wife and they are in agreement.  CODE STATUS: full  TOTAL TIME TAKING CARE OF THIS PATIENT: 27 minutes.     POSSIBLE D/C [facility], DEPENDING ON CLINICAL CONDITION.   Orvan Papadakis M.D on 09/18/2018 at 11:24 AM  Between 7am to 6pm - Pager - [phone] After 6pm go to [email or web address] - password [email or web address] [facility]  Office  [phone]  CC: Primary care physician; [name], MD  Note: This dictation was prepared with Dragon dictation along with smaller phrase technology. Any transcriptional errors that result from this process are unintentional.

## 2018-09-18 NOTE — Care Management Important Message (Signed)
Important Message  Patient Details  Name: Mike Holloway MRN: [id number] Date of Birth: 1941/02/05   Medicare Important Message Given:  Yes    [name] 09/18/2018, 11:33 AM

## 2018-09-18 NOTE — Clinical Social Work Placement (Signed)
   CLINICAL SOCIAL WORK PLACEMENT  NOTE  Date:  09/18/2018  Patient Details  Name: Damico Partin MRN: [id number] Date of Birth: [birth year]  Clinical Social Work is seeking post-discharge placement for this patient at the [place] level of care (*CSW will initial, date and re-position this form in  chart as items are completed):  Yes   Patient/family provided with [facility] Work Department's list of facilities offering this level of care within the geographic area requested by the patient (or if unable, by the patient's family).  Yes   Patient/family informed of their freedom to choose among providers that offer the needed level of care, that participate in Medicare, Medicaid or managed care program needed by the patient, have an available bed and are willing to accept the patient.  Yes   Patient/family informed of [facility]'s ownership interest in [facility] and [facility], as well as of the fact that they are under no obligation to receive care at these facilities.  PASRR submitted to EDS on 09/17/18     PASRR number received on 09/17/18     Existing PASRR number confirmed on       FL2 transmitted to all facilities in geographic area requested by pt/family on 09/17/18     FL2 transmitted to all facilities within larger geographic area on       Patient informed that his/her managed care company has contracts with or will negotiate with certain facilities, including the following:        Yes   Patient/family informed of bed offers received.  Patient chooses bed at ([facility] )     Physician recommends and patient chooses bed at      Patient to be transferred to   on  .  Patient to be transferred to facility by       Patient family notified on   of transfer.  Name of family member notified:        PHYSICIAN       Additional Comment:    _______________________________________________ Rayn Shorb, Veronia Beets, LCSW 09/18/2018,  3:51 PM

## 2018-09-18 NOTE — Progress Notes (Signed)
   Subjective: 2 Days Post-Op Procedure(s) (LRB): INTRAMEDULLARY (IM) NAIL INTERTROCHANTRIC (Right) Patient reports pain as 2 on 0-10 scale.   Patient is well, and has had no acute complaints or problems Therapy was initiated yesterday.  Was able to stand but not weight-bear on the right lower extremity.  No ambulation as of yet. Plan is to go Rehab after hospital stay. no nausea and no vomiting Patient denies any chest pains or shortness of breath. Objective: Vital signs in last 24 hours: Temp:  [98.2 F (36.8 C)-99.4 F (37.4 C)] 98.6 F (37 C) (12/23 0743) Pulse Rate:  [75-95] 75 (12/23 0743) Resp:  [18-19] 19 (12/22 2315) BP: (91-116)/(50-65) 108/62 (12/23 0743) SpO2:  [93 %-96 %] 95 % (12/23 0743) well approximated incision Heels are non tender and elevated off the bed using rolled towels Intake/Output from previous day: 12/22 0701 - 12/23 0700 In: 310 [Blood:310] Out: 850 [Urine:850] Intake/Output this shift: No intake/output data recorded.  Recent Labs    09/15/18 1429 09/17/18 0505 09/17/18 1932 09/18/18 0316  HGB 12.5* 7.8* 9.2* 8.2*   Recent Labs    09/17/18 0505 09/17/18 1932 09/18/18 0316  WBC 7.1  --  8.3  RBC 2.30*  --  2.51*  HCT 23.2* 27.7* 24.6*  PLT 100*  --  86*   Recent Labs    09/15/18 1429 09/17/18 0505  NA 132* 133*  K 4.0 3.7  CL 102 104  CO2 24 24  BUN 19 18  CREATININE 1.20 1.35*  GLUCOSE 123* 147*  CALCIUM 9.5 8.1*   Recent Labs    09/15/18 1429  INR 0.99    EXAM General - Patient is Alert, Appropriate and Oriented Extremity - Neurovascular intact Dorsiflexion/Plantar flexion intact No cellulitis present Compartment soft Dressing - dressing C/D/I Motor Function - intact, moving foot and toes well on exam.    Past Medical History:  Diagnosis Date  . Arthritis   . Atrial flutter ([place])   . Diabetes mellitus ([place])   . Essential tremor   . Essential tremor    deep brain stimulator   . Hypercholesteremia   .  Hypertension   . Incontinence   . 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[place])    grew on the testical  . Sleep apnea   . Stroke [facility])     Assessment/Plan: 2 Days Post-Op Procedure(s) (LRB): INTRAMEDULLARY (IM) NAIL INTERTROCHANTRIC (Right) Active Problems:   Hip fracture (HCC)  Estimated body mass index is 29.3 kg/m as calculated from the following:   Height as of this encounter: 5' 8.75" (1.746 m).   Weight as of this encounter: 89.4 kg. Up with therapy Plan for discharge tomorrow Discharge to SNF  Labs: Hemoglobin 8.2.  Blood pressure still slightly hypotensive with slight increase in heart rate DVT Prophylaxis - Lovenox, Foot Pumps and TED hose Weight-Bearing as tolerated to right leg Patient needs bowel movement Patient will need follow-up prior to clinic 2 weeks postop for staple removal Continue Lovenox injections 30 mg every 12 hours for 2 weeks postop  Karmella Bouvier R. [place] 09/18/2018, 7:51 AM

## 2018-09-18 NOTE — Progress Notes (Signed)
Physical Therapy Treatment Patient Details Name: Mike Holloway MRN: [id number] DOB: April 03, 1941 Today's Date: 09/18/2018    History of Present Illness 77 yo male with onset of fracture falling on porch picking up a package from [place] was admitted with R intertrochanteric fracture and now with IM nailing 09/16/18.  PMHx:  cervical spine stimulator, deep brain stim, sleep apnea, lymphoma, DM, PN, tremors, a-flutter, stroke,     PT Comments    Pt agreeable to PT; pt wishing to try and stand to use urinal. R hip/LE pain 7/10. Mod A supine to sit; Max A sit to supine. Pt requires heavy sequence cues and additional assist at times from family members for scooting to/from edge of bed and up in bed. Pt requires Min guard at times for sitting edge of bed. Stand attempted 2 times from elevated surface with Max A x 2; unable to attain; noted to have poor proprioception with RLE and difficulty placing on ground despite verbal and tactile cues. Pt also unable to use urinal from seated position. Pt ultimately returned to supine, as requested by nursing for in/out catheter. Continue PT to progress range and strength as well as proprioception of RLE for improved functional mobility.   Follow Up Recommendations  SNF     Equipment Recommendations  None recommended by PT    Recommendations for Other Services       Precautions / Restrictions Precautions Precautions: Fall Precaution Comments: deep brain and spine stimulators Restrictions Weight Bearing Restrictions: Yes RLE Weight Bearing: Weight bearing as tolerated    Mobility  Bed Mobility Overal bed mobility: Needs Assistance Bed Mobility: Supine to Sit;Sit to Supine     Supine to sit: Mod assist;Max assist Sit to supine: Max assist   General bed mobility comments: requires all sequence cues and mod A for LEs; heavy assist for trunk. Max A trunk and LEs for sit to supine. Mod A and heavy cues to scoot  Transfers Overall transfer level:  Needs assistance Equipment used: Left platform walker Transfers: Sit to/from Stand Sit to Stand: Max assist;+2 physical assistance;From elevated surface         General transfer comment: Attempted x 2; unable to attain stand; difficulty with proprioception on R to place/maintain foot on floor  Ambulation/Gait             General Gait Details: unable   Stairs             Wheelchair Mobility    Modified Rankin (Stroke Patients Only)       Balance Overall balance assessment: Needs assistance Sitting-balance support: Bilateral upper extremity supported;Feet supported Sitting balance-Leahy Scale: Fair     Standing balance support: Bilateral upper extremity supported Standing balance-Leahy Scale: Zero(unable to attain upright stance)                              Cognition Arousal/Alertness: Awake/alert Behavior During Therapy: WFL for tasks assessed/performed Overall Cognitive Status: Within Functional Limits for tasks assessed                                        Exercises Other Exercises Other Exercises: sitting balance edge of bed Other Exercises: attempted urinal use in sit with sitting balance    General Comments        Pertinent Vitals/Pain Pain Assessment: 0-10 Pain Score: 7  Pain Location: R hip  Pain Descriptors / Indicators: Constant;Aching;Operative site guarding Pain Intervention(s): RN gave pain meds during session    Home Living                      Prior Function            PT Goals (current goals can now be found in the care plan section) Progress towards PT goals: Progressing toward goals(slowly)    Frequency    BID      PT Plan Current plan remains appropriate    Co-evaluation              AM-PAC PT "6 Clicks" Mobility   Outcome Measure  Help needed turning from your back to your side while in a flat bed without using bedrails?: A Lot Help needed moving from lying on  your back to sitting on the side of a flat bed without using bedrails?: A Lot Help needed moving to and from a bed to a chair (including a wheelchair)?: Total Help needed standing up from a chair using your arms (e.g., wheelchair or bedside chair)?: Total Help needed to walk in hospital room?: Total Help needed climbing 3-5 steps with a railing? : Total 6 Click Score: 8    End of Session Equipment Utilized During Treatment: Gait belt Activity Tolerance: Patient limited by fatigue;Patient limited by pain(weakness/poor proprioception) Patient left: in bed;with call bell/phone within reach;with family/visitor present(nursing notified to perform in/out cath as requested)   PT Visit Diagnosis: Unsteadiness on feet (R26.81);Muscle weakness (generalized) (M62.81);Difficulty in walking, not elsewhere classified (R26.2);Pain Pain - Right/Left: Right Pain - part of body: Hip     Time: 9629-5284 PT Time Calculation (min) (ACUTE ONLY): 36 min  Charges:  $Therapeutic Activity: 23-37 mins                     Larae Grooms, PTA 09/18/2018, 12:06 PM

## 2018-09-18 NOTE — Progress Notes (Signed)
Clinical Education officer, museum (CSW) met with patient and his wife [name]. CSW presented SNF bed offers. They chose [facility]. Per patient his brain stimulator is something he knows how to work and the nursing staff at [facility] do not have to do anything with it. [facility] liaison is aware of above.   [employer], LCSW [phone]

## 2018-09-19 LAB — CBC
HCT: 22.4 % — ABNORMAL LOW (ref 39.0–52.0)
Hemoglobin: 7.3 g/dL — ABNORMAL LOW (ref 13.0–17.0)
MCH: 32.2 pg (ref 26.0–34.0)
MCHC: 32.6 g/dL (ref 30.0–36.0)
MCV: 98.7 fL (ref 80.0–100.0)
Platelets: 101 10*3/uL — ABNORMAL LOW (ref 150–400)
RBC: 2.27 MIL/uL — ABNORMAL LOW (ref 4.22–5.81)
RDW: 17 % — ABNORMAL HIGH (ref 11.5–15.5)
WBC: 7.6 10*3/uL (ref 4.0–10.5)
nRBC: 0 % (ref 0.0–0.2)

## 2018-09-19 LAB — BASIC METABOLIC PANEL
Anion gap: 4 — ABNORMAL LOW (ref 5–15)
BUN: 20 mg/dL (ref 8–23)
CO2: 24 mmol/L (ref 22–32)
Calcium: 8.3 mg/dL — ABNORMAL LOW (ref 8.9–10.3)
Chloride: 103 mmol/L (ref 98–111)
Creatinine, Ser: 1.17 mg/dL (ref 0.61–1.24)
GFR calc Af Amer: >60 mL/min (ref >60)
GFR calc non Af Amer: >60 mL/min (ref >60)
Glucose, Bld: 165 mg/dL — ABNORMAL HIGH (ref 70–99)
Potassium: 4.2 mmol/L (ref 3.5–5.1)
Sodium: 131 mmol/L — ABNORMAL LOW (ref 135–145)

## 2018-09-19 LAB — PREPARE RBC (CROSSMATCH)

## 2018-09-19 LAB — HEMOGLOBIN AND HEMATOCRIT, BLOOD
HCT: 25.9 % — ABNORMAL LOW (ref 39.0–52.0)
Hemoglobin: 8.8 g/dL — ABNORMAL LOW (ref 13.0–17.0)

## 2018-09-19 MED ORDER — SODIUM CHLORIDE 0.9% IV SOLUTION: Freq: Once | INTRAVENOUS | Status: DC

## 2018-09-19 MED ORDER — FE FUMARATE-B12-VIT C-FA-IFC PO CAPS: 1.0000 | ORAL_CAPSULE | Freq: Two times a day (BID) | ORAL | Status: DC

## 2018-09-19 MED ORDER — HYDROCODONE-ACETAMINOPHEN 5-325 MG PO TABS: 1.0000 | ORAL_TABLET | ORAL | 0 refills | Status: DC | PRN

## 2018-09-19 MED ORDER — PANTOPRAZOLE SODIUM 40 MG PO TBEC: 40.0000 mg | DELAYED_RELEASE_TABLET | Freq: Every day | ORAL | Status: DC

## 2018-09-19 MED ORDER — PANTOPRAZOLE SODIUM 40 MG PO TBEC
40.0000 mg | DELAYED_RELEASE_TABLET | Freq: Every day | ORAL | Status: DC
  Administered 2018-09-19 – 2018-09-21 (×3): 40 mg via ORAL
  Filled 2018-09-19 (×3): qty 1

## 2018-09-19 NOTE — Progress Notes (Addendum)
Subjective: 3 Days Post-Op Procedure(s) (LRB): INTRAMEDULLARY (IM) NAIL INTERTROCHANTRIC (Right) Patient reports pain as mild.   Patient is well, and has had no acute complaints or problems Patient unable to do therapy secondary to more fatigue than anything. Patient much more alert this morning since discontinuing the oxycodone and taking tramadol.  Is able to keep his eyes open to carry on a conversation. Wife is present and asks numerous questions that were answered Plan is to go Rehab after hospital stay. no nausea and no vomiting Patient denies any chest pains or shortness of breath.  Objective: Vital signs in last 24 hours: Temp:  [98.4 F (36.9 C)-99 F (37.2 C)] 99 F (37.2 C) (12/23 2353) Pulse Rate:  [72-75] 72 (12/23 2353) Resp:  [16-18] 18 (12/23 2353) BP: (104-112)/(58-63) 112/61 (12/23 2353) SpO2:  [94 %-96 %] 96 % (12/23 2353) Weight:  [89.4 kg] 89.4 kg (12/24 0643) well approximated incision Heels are non tender and elevated off the bed using rolled towels Intake/Output from previous day: 12/23 0701 - 12/24 0700 In: 120 [P.O.:120] Out: 1275 [Urine:1275] Intake/Output this shift: No intake/output data recorded.  Recent Labs    09/17/18 0505 09/17/18 1932 09/18/18 0316 09/19/18 0437  HGB 7.8* 9.2* 8.2* 7.3*   Recent Labs    09/18/18 0316 09/19/18 0437  WBC 8.3 7.6  RBC 2.51* 2.27*  HCT 24.6* 22.4*  PLT 86* 101*   Recent Labs    09/17/18 0505 09/19/18 0437  NA 133* 131*  K 3.7 4.2  CL 104 103  CO2 24 24  BUN 18 20  CREATININE 1.35* 1.17  GLUCOSE 147* 165*  CALCIUM 8.1* 8.3*   No results for input(s): LABPT, INR in the last 72 hours.  EXAM General - Patient is Alert, Appropriate and Oriented Extremity - Neurovascular intact Intact pulses distally Dorsiflexion/Plantar flexion intact No cellulitis present Compartment soft  Right thigh swollen but not excessively or tight Dressing - dressing C/D/I Motor Function - intact, moving  foot and toes well on exam.    Past Medical History:  Diagnosis Date  . Arthritis   . Atrial flutter ([place])   . Diabetes mellitus ([place])   . Essential tremor   . Essential tremor    deep brain stimulator   . Hypercholesteremia   . Hypertension   . Incontinence   . 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[place])    grew on the testical  . Sleep apnea   . Stroke [facility])     Assessment/Plan: 3 Days Post-Op Procedure(s) (LRB): INTRAMEDULLARY (IM) NAIL INTERTROCHANTRIC (Right) Active Problems:   Hip fracture (HCC)  Estimated body mass index is 29.3 kg/m as calculated from the following:   Height as of this encounter: 5' 8.75" (1.746 m).   Weight as of this encounter: 89.4 kg. Up with therapy Discharge to SNF when medically cleared.  From orthopedic standpoint he is cleared for transfer  Labs: Hemoglobin has dropped once again to 7.3 from 8.2.  Consider  transfusing 1 packed RBCs.  Vital signs are all stable and within normal limits.  May help with some of his fatigue that he had yesterday. DVT Prophylaxis - Lovenox, Foot Pumps and TED hose Weight-Bearing as tolerated to right leg Patient has had a bowel movement but wife states it was her normal bowel movement. Patient will need follow-up prior to clinic 2 weeks postop for staple removal Continue Lovenox injections 30 mg every 12 hours for 2 weeks postop   [employer] [facility] 09/19/2018, 7:42  AM

## 2018-09-19 NOTE — Progress Notes (Signed)
Physical Therapy Treatment Patient Details Name: Mike Holloway MRN: [id number] DOB: [birth year] Today's Date: 09/19/2018    History of Present Illness 77 yo male with onset of fracture falling on porch picking up a package from [place] was admitted with R intertrochanteric fracture and now with IM nailing 09/16/18.  PMHx:  cervical spine stimulator, deep brain stim, sleep apnea, lymphoma, DM, PN, tremors, a-flutter, stroke,     PT Comments    Spoke with nursing prior to treatment, as pt preparing for transfusion. Nurse agreeable after IV placed. Returned for treatment; IV unable to be placed (awaiting IV team); pt agreeable to bed exercises. R hip pain mildly improved; however R knee pain increased with heel slides, so deferred at this time. Pt does show progression with addition of AAROM SLR. Pt fatigued post limited exercise session. Spouse requests afternoon session around 1300 if able. Will attempt second session then pending transfusion status, which was explained to spouse; spouse understands. Continue PT to progress range, strength and endurance to improve all functional mobility.    Follow Up Recommendations  SNF     Equipment Recommendations  None recommended by PT    Recommendations for Other Services       Precautions / Restrictions Precautions Precautions: Fall Restrictions Weight Bearing Restrictions: Yes RLE Weight Bearing: Weight bearing as tolerated    Mobility  Bed Mobility               General bed mobility comments: Not tested (fatigue and awaiting IV team)  Transfers                    Ambulation/Gait                 Stairs             Wheelchair Mobility    Modified Rankin (Stroke Patients Only)       Balance                                            Cognition Arousal/Alertness: Lethargic Behavior During Therapy: WFL for tasks assessed/performed Overall Cognitive Status: Within Functional  Limits for tasks assessed                                 General Comments: pt reports fatigue and wanting to sleep      Exercises General Exercises - Lower Extremity Ankle Circles/Pumps: AROM;Both;20 reps Quad Sets: Strengthening;Both;10 reps Gluteal Sets: Strengthening;Both;10 reps Short Arc Quad: AROM;Both;10 reps Heel Slides: AROM;AAROM;Left;10 reps;Other (comment)(attempted R; too much pain in R knee) Hip ABduction/ADduction: AAROM;Both;10 reps Straight Leg Raises: AAROM;Both;10 reps    General Comments        Pertinent Vitals/Pain Pain Assessment: 0-10 Pain Score: 4 (R knee greater with attempted AAROM heelslide) Pain Location: R hip  Pain Descriptors / Indicators: Constant;Dull;Aching    Home Living                      Prior Function            PT Goals (current goals can now be found in the care plan section) Progress towards PT goals: Progressing toward goals(slowly)    Frequency    BID      PT Plan Current plan remains appropriate    Co-evaluation  AM-PAC PT "6 Clicks" Mobility   Outcome Measure  Help needed turning from your back to your side while in a flat bed without using bedrails?: A Lot Help needed moving from lying on your back to sitting on the side of a flat bed without using bedrails?: A Lot Help needed moving to and from a bed to a chair (including a wheelchair)?: Total Help needed standing up from a chair using your arms (e.g., wheelchair or bedside chair)?: Total Help needed to walk in hospital room?: Total Help needed climbing 3-5 steps with a railing? : Total 6 Click Score: 8    End of Session   Activity Tolerance: Patient limited by fatigue;Patient limited by pain Patient left: in bed;with call bell/phone within reach;with bed alarm set;with family/visitor present;with SCD's reapplied Nurse Communication: Other (comment)(allowable ex/before and with transfusion) PT Visit Diagnosis:  Unsteadiness on feet (R26.81);Muscle weakness (generalized) (M62.81);Difficulty in walking, not elsewhere classified (R26.2);Pain Pain - Right/Left: Right Pain - part of body: Hip     Time: 1110-1130 PT Time Calculation (min) (ACUTE ONLY): 20 min  Charges:  $Therapeutic Exercise: 8-22 mins                      Larae Grooms, PTA 09/19/2018, 11:39 AM

## 2018-09-19 NOTE — Progress Notes (Signed)
PT Cancellation Note  Patient Details Name: Mike Holloway MRN: [id number] DOB: May 03, 1941   Cancelled Treatment:    Reason Eval/Treat Not Completed: Fatigue/lethargy limiting ability to participate;Medical issues which prohibited therapy. Treatment attempted, pt with increased lethargy/sleeping. Spouse notes IV was just placed and nurse is going to start transfusion soon. Spouse notes that pt is "done right now" "not even able to do what he did this morning". Hold treatment at this time until improved status post transfusion. Pt has not been able to awaken to eat lunch. Re attempt tomorrow.    Larae Grooms, PTA 09/19/2018, 1:10 PM

## 2018-09-19 NOTE — Progress Notes (Signed)
[place] at [place] NAME: Mike Holloway    MR#:  [id number]  DATE OF BIRTH:  1940/10/08  SUBJECTIVE:  Hemoglobin low again this morning.  Patient is asymptomatic.  Patient denies melena or dark-colored stools.  REVIEW OF SYSTEMS:    Review of Systems  Constitutional: Negative for fever, chills weight loss HENT: Negative for ear pain, nosebleeds, congestion, facial swelling, rhinorrhea, neck pain, neck stiffness and ear discharge.   Respiratory: Negative for cough, shortness of breath, wheezing  Cardiovascular: Negative for chest pain, palpitations and leg swelling.  Gastrointestinal: Negative for heartburn, abdominal pain, vomiting, diarrhea or consitpation Genitourinary: Negative for dysuria, urgency, frequency, hematuria Musculoskeletal: Negative for back pain or no joint pain Neurological: Negative for dizziness, seizures, syncope, focal weakness,  numbness and headaches.  Hematological: Does not bruise/bleed easily.  Psychiatric/Behavioral: Negative for hallucinations, confusion, dysphoric mood    Tolerating Diet: yes      DRUG ALLERGIES:   Allergies  Allergen Reactions  . Clindamycin Diarrhea    Contracted C. Diff x 2  . Flagyl [Metronidazole] Swelling    Swollen tongue, excessive shaking,    . Ambien  [Zolpidem]     Other reaction(s): Other (See Comments) Disoriented and moody Other reaction(s): Other (See Comments) Disoriented and moody  . Penicillins Rash    Has patient had a PCN reaction causing immediate rash, facial/tongue/throat swelling, SOB or lightheadedness with hypotension: Unknown Has patient had a PCN reaction causing severe rash involving mucus membranes or skin necrosis: Unknown Has patient had a PCN reaction that required hospitalization: No Has patient had a PCN reaction occurring within the last 10 years: No If all of the above answers are "NO", then may proceed with Cephalosporin use.  . Sulfa  Antibiotics Rash    VITALS:  Blood pressure 135/62, pulse 70, temperature 98.3 F (36.8 C), temperature source Oral, resp. rate 18, height 5' 8.75" (1.746 m), weight 89.4 kg, SpO2 98 %.  PHYSICAL EXAMINATION:  Constitutional: Appears well-developed and well-nourished. No distress. HENT: Normocephalic. [name] Oropharynx is clear and moist.  Eyes: Conjunctivae and EOM are normal. PERRLA, no scleral icterus.  Neck: Normal ROM. Neck supple. No JVD. No tracheal deviation. CVS: RRR, S1/S2 +, no murmurs, no gallops, no carotid bruit.  Pulmonary: Effort and breath sounds normal, no stridor, rhonchi, wheezes, rales.  Abdominal: Soft. BS +,  no distension, tenderness, rebound or guarding.  Musculoskeletal: Normal range of motion. No edema and no tenderness.  Neuro: Alert. CN 2-12 grossly intact. No focal deficits. Skin: Skin is warm and dry. No rash noted. Psychiatric: Normal mood and affect.      LABORATORY PANEL:   CBC Recent Labs  Lab 09/19/18 0437  WBC 7.6  HGB 7.3*  HCT 22.4*  PLT 101*   ------------------------------------------------------------------------------------------------------------------  Chemistries  Recent Labs  Lab 09/15/18 1429  09/19/18 0437  NA 132*   < > 131*  K 4.0   < > 4.2  CL 102   < > 103  CO2 24   < > 24  GLUCOSE 123*   < > 165*  BUN 19   < > 20  CREATININE 1.20   < > 1.17  CALCIUM 9.5   < > 8.3*  AST 19  --   --   ALT 13  --   --   ALKPHOS 74  --   --   BILITOT 0.6  --   --    < > = values in this  interval not displayed.   ------------------------------------------------------------------------------------------------------------------  Cardiac Enzymes No results for input(s): TROPONINI in the last 168 hours. ------------------------------------------------------------------------------------------------------------------  RADIOLOGY:  No results found.   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   1.  Intertrochanteric commuted fracture of right femur: Patient is postoperative day #3 With postop complication including anemia requiring blood transfusion Patient will require another unit PRBC. Start PPI Patient is not on Lovenox due to anemia and low platelets.   2.  Chronic thrombocytopenia: Continue to monitor Would not use Lovenox for DVT prophylaxis due to thrombocytopenia  3.  Acute on chronic blood loss anemia from surgery: Patient will receive 1 more unit of PRBC.  CBC for a.m. ordered.   Continue ferrous fumarate and monitor CBC  4.  Depression: Continue Prozac  5.  Essential tremor: Continue primidone and propanolol  6.  BPH with acute urinary retention: Continue Flomax He will need Foley catheter at the time of discharge.  He will need voiding trial in 1 week.  He will be followed by Dr. Bernardo Heater at [facility] urology.  7.  Essential hypertension: Blood pressure is stable . Consider restarting diuretic tomorrow  8.  Constipation: Patient had bowel movement  Management plans discussed with the patient and wife and they are in agreement.  CODE STATUS: full  TOTAL TIME TAKING CARE OF THIS PATIENT: 28 minutes.     [facility], DEPENDING ON CLINICAL CONDITION.   Mike Holloway M.D on 09/19/2018 at 9:39 AM  Between 7am to 6pm - Pager - [phone] After 6pm go to [email or web address] - password [email or web address] [place] [facility]  Office  [phone]  CC: Primary care physician; [name], MD  Note: This dictation was prepared with Dragon dictation along with smaller phrase technology. Any transcriptional errors that result from this process are unintentional.

## 2018-09-19 NOTE — Progress Notes (Signed)
Patient hgb 7.3 this am. MD notified. No orders received.

## 2018-09-19 NOTE — Discharge Summary (Addendum)
[place] at [place] NAME: Mike Holloway    MR#:  [id number]  DATE OF BIRTH:  [birth year]  DATE OF ADMISSION:  09/15/2018 ADMITTING PHYSICIAN: Harrie Foreman, MD  DATE OF DISCHARGE: 09/21/2018  PRIMARY CARE PHYSICIAN: [name], MD    ADMISSION DIAGNOSIS:  Closed fracture of right hip, initial encounter ([place]) [S72.001A]  DISCHARGE DIAGNOSIS:  Active Problems:   Hip fracture ([place])   SECONDARY DIAGNOSIS:   Past Medical History:  Diagnosis Date  . Arthritis   . Atrial flutter ([place])   . Diabetes mellitus ([place])   . Essential tremor   . Essential tremor    deep brain stimulator   . Hypercholesteremia   . Hypertension   . Incontinence   . 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[place])    grew on the testical  . Sleep apnea   . 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   1.  Intertrochanteric commuted fracture of right femur: Patient is postoperative day #5 With postop complication including anemia requiring blood transfusion Patient required blood transfusions while in the hospital but now hemoglobin has stabalized. He has a bruise near his hip which is also contributed to blood loss.  Patient's wife was determined that Lovenox was causing the patient a side effect such as hallucinations.  She did not want Lovenox to be continued for DVT prophylaxis.  I did explain that patient is high risk of DVT/PE and possible death.  We will do aspirin 325 mg daily.  If the family feels comfortable later on during his rehab stay to reinstate Lovenox she may consider this.  The patient is in agreement with this plan and would like to Lovenox to be discontinued as well. He has outpatient ORTHO follow up in 1 week.  2.  Chronic thrombocytopenia:His platelet count is at  baseline at the time of discharge.   3.  Acute on chronic blood loss anemia from surgery: Patient received a total of 2 units PRBC while in the hospital for blood loss from surgery and the bruise.   Continue ferrous fumarate. 4.  Depression: Continue Prozac  5.  Essential tremor: Continue primidone and propanolol  6.  BPH with acute urinary retention: Continue Flomax He will need Foley catheter at the time of discharge.  He will need voiding trial in 1 week.  He will be followed by Dr. Bernardo Heater at [facility] urology.  7.  Essential hypertension: Blood pressure is stable . He may restart outpatient medications  8.  Constipation: Patient had bowel movement   DISCHARGE CONDITIONS AND DIET:  stable Cardiac diet  CONSULTS OBTAINED:  Treatment Team:  Hessie Knows, MD  DRUG ALLERGIES:   Allergies  Allergen Reactions  . Clindamycin Diarrhea    Contracted C. Diff x 2  . Flagyl [Metronidazole] Swelling    Swollen tongue, excessive shaking,    . Ambien  [Zolpidem]     Other reaction(s): Other (See Comments) Disoriented and moody Other reaction(s): Other (See Comments) Disoriented and moody  . Penicillins Rash    Has patient had a PCN reaction causing immediate rash, facial/tongue/throat swelling, SOB or lightheadedness with hypotension: Unknown Has patient had a PCN reaction causing severe rash involving mucus membranes or skin necrosis: Unknown Has patient had a PCN reaction that required hospitalization: No Has patient had  a PCN reaction occurring within the last 10 years: No If all of the above answers are "NO", then may proceed with Cephalosporin use.  . Sulfa Antibiotics Rash    DISCHARGE MEDICATIONS:   Allergies as of 09/21/2018      Reactions   Clindamycin Diarrhea   Contracted C. Diff x 2   Flagyl [metronidazole] Swelling   Swollen tongue, excessive shaking,     Ambien  [zolpidem]    Other reaction(s): Other (See Comments) Disoriented and moody Other  reaction(s): Other (See Comments) Disoriented and moody   Penicillins Rash   Has patient had a PCN reaction causing immediate rash, facial/tongue/throat swelling, SOB or lightheadedness with hypotension: Unknown Has patient had a PCN reaction causing severe rash involving mucus membranes or skin necrosis: Unknown Has patient had a PCN reaction that required hospitalization: No Has patient had a PCN reaction occurring within the last 10 years: No If all of the above answers are "NO", then may proceed with Cephalosporin use.   Sulfa Antibiotics Rash      Medication List    TAKE these medications   aspirin EC 325 MG tablet Take 1 tablet (325 mg total) by mouth daily.   diclofenac sodium 1 % Gel Commonly known as:  VOLTAREN Apply 2 g topically 2 (two) times daily as needed (pain).   famotidine 20 MG tablet Commonly known as:  PEPCID TK 1 T PO BID   ferrous [id number] C-folic acid capsule Commonly known as:  TRINSICON / FOLTRIN Take 1 capsule by mouth 2 (two) times daily after a meal.   FLUoxetine 10 MG tablet Commonly known as:  PROZAC Take 10 mg by mouth daily.   HYDROcodone-acetaminophen 5-325 MG tablet Commonly known as:  NORCO/VICODIN Take 1-2 tablets by mouth every 4 (four) hours as needed for moderate pain (pain score 4-6).   mirabegron ER 50 MG Tb24 tablet Commonly known as:  MYRBETRIQ Take 1 tablet (50 mg total) by mouth daily.   pantoprazole 40 MG tablet Commonly known as:  PROTONIX Take 1 tablet (40 mg total) by mouth daily.   primidone 50 MG tablet Commonly known as:  MYSOLINE Take 50-100 mg by mouth 2 (two) times daily. 100 mg in the morning and 50 mg at bedtime   propranolol ER 80 MG 24 hr capsule Commonly known as:  INDERAL LA Take 160 mg by mouth daily.   tamsulosin 0.4 MG Caps capsule Commonly known as:  FLOMAX Take 0.4 mg by mouth daily.   traMADol 50 MG tablet Commonly known as:  ULTRAM Take 1 tablet (50 mg total) by mouth every 6  (six) hours as needed.   triamterene-hydrochlorothiazide 37.5-25 MG tablet Commonly known as:  MAXZIDE-25 TK 1 T PO QD   VITAMIN D-1000 MAX ST 25 MCG (1000 UT) tablet Generic drug:  Cholecalciferol Take 1,000 Units by mouth at bedtime.         Today   CHIEF COMPLAINT:   No issues   VITAL SIGNS:  Blood pressure 119/69, pulse 70, temperature 98.4 F (36.9 C), temperature source Oral, resp. rate 18, height 5' 8.75" (1.746 m), weight 89.4 kg, SpO2 95 %.   REVIEW OF SYSTEMS:  Review of Systems  Constitutional: Negative.  Negative for chills, fever and malaise/fatigue.  HENT: Negative.  Negative for ear discharge, ear pain, hearing loss, nosebleeds and sore throat.   Eyes: Negative.  Negative for blurred vision and pain.  Respiratory: Negative.  Negative for cough, hemoptysis, shortness of breath and wheezing.  Cardiovascular: Negative.  Negative for chest pain, palpitations and leg swelling.  Gastrointestinal: Negative.  Negative for abdominal pain, blood in stool, diarrhea, nausea and vomiting.  Genitourinary: Negative.  Negative for dysuria.  Musculoskeletal: Negative.  Negative for back pain.  Skin: Negative.   Neurological: Negative for dizziness, tremors, speech change, focal weakness, seizures and headaches.  Endo/Heme/Allergies: Negative.  Does not bruise/bleed easily.  Psychiatric/Behavioral: Negative.  Negative for depression, hallucinations and suicidal ideas.     PHYSICAL EXAMINATION:  GENERAL:  77 y.o.-year-old patient lying in the bed with no acute distress.  NECK:  Supple, no jugular venous distention. No thyroid enlargement, no tenderness.  LUNGS: Normal breath sounds bilaterally, no wheezing, rales,rhonchi  No use of accessory muscles of respiration.  CARDIOVASCULAR: S1, S2 normal. No murmurs, rubs, or gallops.  ABDOMEN: Soft, non-tender, non-distended. Bowel sounds present. No organomegaly or mass.  EXTREMITIES: No pedal edema, cyanosis, or clubbing.   PSYCHIATRIC: The patient is alert and oriented x 3.  SKIN: No obvious rash, lesion, or ulcer.   DATA REVIEW:   CBC Recent Labs  Lab 09/21/18 0328  WBC 8.2  HGB 8.3*  HCT 25.2*  PLT 144*    Chemistries  Recent Labs  Lab 09/15/18 1429  09/19/18 0437  NA 132*   < > 131*  K 4.0   < > 4.2  CL 102   < > 103  CO2 24   < > 24  GLUCOSE 123*   < > 165*  BUN 19   < > 20  CREATININE 1.20   < > 1.17  CALCIUM 9.5   < > 8.3*  AST 19  --   --   ALT 13  --   --   ALKPHOS 74  --   --   BILITOT 0.6  --   --    < > = values in this interval not displayed.    Cardiac Enzymes No results for input(s): TROPONINI in the last 168 hours.  Microbiology Results  @MICRORSLT48 @  RADIOLOGY:  No results found.    Allergies as of 09/21/2018      Reactions   Clindamycin Diarrhea   Contracted C. Diff x 2   Flagyl [metronidazole] Swelling   Swollen tongue, excessive shaking,     Ambien  [zolpidem]    Other reaction(s): Other (See Comments) Disoriented and moody Other reaction(s): Other (See Comments) Disoriented and moody   Penicillins Rash   Has patient had a PCN reaction causing immediate rash, facial/tongue/throat swelling, SOB or lightheadedness with hypotension: Unknown Has patient had a PCN reaction causing severe rash involving mucus membranes or skin necrosis: Unknown Has patient had a PCN reaction that required hospitalization: No Has patient had a PCN reaction occurring within the last 10 years: No If all of the above answers are "NO", then may proceed with Cephalosporin use.   Sulfa Antibiotics Rash      Medication List    TAKE these medications   aspirin EC 325 MG tablet Take 1 tablet (325 mg total) by mouth daily.   diclofenac sodium 1 % Gel Commonly known as:  VOLTAREN Apply 2 g topically 2 (two) times daily as needed (pain).   famotidine 20 MG tablet Commonly known as:  PEPCID TK 1 T PO BID   ferrous [id number] C-folic acid capsule Commonly known  as:  TRINSICON / FOLTRIN Take 1 capsule by mouth 2 (two) times daily after a meal.   FLUoxetine 10 MG tablet Commonly known as:  PROZAC  Take 10 mg by mouth daily.   HYDROcodone-acetaminophen 5-325 MG tablet Commonly known as:  NORCO/VICODIN Take 1-2 tablets by mouth every 4 (four) hours as needed for moderate pain (pain score 4-6).   mirabegron ER 50 MG Tb24 tablet Commonly known as:  MYRBETRIQ Take 1 tablet (50 mg total) by mouth daily.   pantoprazole 40 MG tablet Commonly known as:  PROTONIX Take 1 tablet (40 mg total) by mouth daily.   primidone 50 MG tablet Commonly known as:  MYSOLINE Take 50-100 mg by mouth 2 (two) times daily. 100 mg in the morning and 50 mg at bedtime   propranolol ER 80 MG 24 hr capsule Commonly known as:  INDERAL LA Take 160 mg by mouth daily.   tamsulosin 0.4 MG Caps capsule Commonly known as:  FLOMAX Take 0.4 mg by mouth daily.   traMADol 50 MG tablet Commonly known as:  ULTRAM Take 1 tablet (50 mg total) by mouth every 6 (six) hours as needed.   triamterene-hydrochlorothiazide 37.5-25 MG tablet Commonly known as:  MAXZIDE-25 TK 1 T PO QD   VITAMIN D-1000 MAX ST 25 MCG (1000 UT) tablet Generic drug:  Cholecalciferol Take 1,000 Units by mouth at bedtime.          Management plans discussed with the patient and he is in agreement. Stable for discharge   Patient should follow up with ortho  CODE STATUS:     Code Status Orders  (From admission, onward)         Start     Ordered   09/15/18 1832  Full code  Continuous     09/15/18 1831        Code Status History    Date Active Date Inactive Code Status Order ID Comments User Context   03/25/2018 0111 03/29/2018 2049 Full Code [id number]  Lance Coon, MD Inpatient      TOTAL TIME TAKING CARE OF THIS PATIENT: 38 minutes.    Note: This dictation was prepared with Dragon dictation along with smaller phrase technology. Any transcriptional errors that result from this process  are unintentional.  Talicia Sui M.D on 09/21/2018 at 11:12 AM  Between 7am to 6pm - Pager - [phone] After 6pm go to [email or web address] - password [email or web address] [place] [facility]  Office  [phone]  CC: Primary care physician; [name], MD

## 2018-09-19 NOTE — Progress Notes (Signed)
Per Otila K[facility] liaison patient can come to [facility] tomorrow if medically stable. Clinical Education officer, museum (CSW) sent D/C summary to [facility] via [facility] today. Patient and his wife [name] are aware of above. MD aware of above.   [employer], LCSW [phone]

## 2018-09-20 LAB — CBC
HCT: 26.2 % — ABNORMAL LOW (ref 39.0–52.0)
Hemoglobin: 8.9 g/dL — ABNORMAL LOW (ref 13.0–17.0)
MCH: 32.7 pg (ref 26.0–34.0)
MCHC: 34 g/dL (ref 30.0–36.0)
MCV: 96.3 fL (ref 80.0–100.0)
Platelets: 121 10*3/uL — ABNORMAL LOW (ref 150–400)
RBC: 2.72 MIL/uL — ABNORMAL LOW (ref 4.22–5.81)
RDW: 17.5 % — ABNORMAL HIGH (ref 11.5–15.5)
WBC: 9.6 10*3/uL (ref 4.0–10.5)
nRBC: 0 % (ref 0.0–0.2)

## 2018-09-20 LAB — BPAM RBC
Blood Product Expiration Date: 202001162359
ISSUE DATE / TIME: 201912241308
Unit Type and Rh: 5100

## 2018-09-20 LAB — TYPE AND SCREEN
ABO/RH(D): O POS
Antibody Screen: NEGATIVE
Unit division: 0

## 2018-09-20 MED ORDER — ENOXAPARIN SODIUM 40 MG/0.4ML ~~LOC~~ SOLN
40.0000 mg | SUBCUTANEOUS | Status: DC
  Administered 2018-09-20 – 2018-09-21 (×2): 40 mg via SUBCUTANEOUS
  Filled 2018-09-20 (×2): qty 0.4

## 2018-09-20 NOTE — Progress Notes (Signed)
[place] at [place] NAME: Mike Holloway    MR#:  [id number]  DATE OF BIRTH:  [birth year]  SUBJECTIVE:  Patient slept well yesterday.  REVIEW OF SYSTEMS:    Review of Systems  Constitutional: Negative for fever, chills weight loss HENT: Negative for ear pain, nosebleeds, congestion, facial swelling, rhinorrhea, neck pain, neck stiffness and ear discharge.   Respiratory: Negative for cough, shortness of breath, wheezing  Cardiovascular: Negative for chest pain, palpitations and leg swelling.  Gastrointestinal: Negative for heartburn, abdominal pain, vomiting, diarrhea or consitpation Genitourinary: Negative for dysuria, urgency, frequency, hematuria Musculoskeletal: Negative for back pain or no joint pain Neurological: Negative for dizziness, seizures, syncope, focal weakness,  numbness and headaches.  Hematological: Does not bruise/bleed easily.  Psychiatric/Behavioral: Negative for hallucinations, confusion, dysphoric mood    Tolerating Diet: yes      DRUG ALLERGIES:   Allergies  Allergen Reactions  . Clindamycin Diarrhea    Contracted C. Diff x 2  . Flagyl [Metronidazole] Swelling    Swollen tongue, excessive shaking,    . Ambien  [Zolpidem]     Other reaction(s): Other (See Comments) Disoriented and moody Other reaction(s): Other (See Comments) Disoriented and moody  . Penicillins Rash    Has patient had a PCN reaction causing immediate rash, facial/tongue/throat swelling, SOB or lightheadedness with hypotension: Unknown Has patient had a PCN reaction causing severe rash involving mucus membranes or skin necrosis: Unknown Has patient had a PCN reaction that required hospitalization: No Has patient had a PCN reaction occurring within the last 10 years: No If all of the above answers are "NO", then may proceed with Cephalosporin use.  . Sulfa Antibiotics Rash    VITALS:  Blood pressure (!) 145/74, pulse 77, temperature 98.9  F (37.2 C), temperature source Oral, resp. rate 14, height 5' 8.75" (1.746 m), weight 89.4 kg, SpO2 98 %.  PHYSICAL EXAMINATION:  Constitutional: Appears well-developed and well-nourished. No distress. HENT: Normocephalic. [name] Oropharynx is clear and moist.  Eyes: Conjunctivae and EOM are normal. PERRLA, no scleral icterus.  Neck: Normal ROM. Neck supple. No JVD. No tracheal deviation. CVS: RRR, S1/S2 +, no murmurs, no gallops, no carotid bruit.  Pulmonary: Effort and breath sounds normal, no stridor, rhonchi, wheezes, rales.  Abdominal: Soft. BS +,  no distension, tenderness, rebound or guarding.  Musculoskeletal: Normal range of motion. No edema and no tenderness.  Neuro: Alert. CN 2-12 grossly intact. No focal deficits. Skin: Bruising around hip. Psychiatric: Normal mood and affect.      LABORATORY PANEL:   CBC Recent Labs  Lab 09/20/18 0810  WBC 9.6  HGB 8.9*  HCT 26.2*  PLT 121*   ------------------------------------------------------------------------------------------------------------------  Chemistries  Recent Labs  Lab 09/15/18 1429  09/19/18 0437  NA 132*   < > 131*  K 4.0   < > 4.2  CL 102   < > 103  CO2 24   < > 24  GLUCOSE 123*   < > 165*  BUN 19   < > 20  CREATININE 1.20   < > 1.17  CALCIUM 9.5   < > 8.3*  AST 19  --   --   ALT 13  --   --   ALKPHOS 74  --   --   BILITOT 0.6  --   --    < > = values in this interval not displayed.   ------------------------------------------------------------------------------------------------------------------  Cardiac Enzymes No results for input(s): TROPONINI in the  last 168 hours. ------------------------------------------------------------------------------------------------------------------  RADIOLOGY:  No results found.   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   1.  Intertrochanteric commuted fracture of right femur: Patient is postoperative day #4 With postop complication including anemia requiring blood transfusion Patient has bruise which is contributing to low hemoglobin. Repeat CBC in a.m. and if stable then patient will be discharged to skilled nursing facility tomorrow.  Patient put back on Lovenox today.  We will repeat CBC as mentioned above.   2.  Chronic thrombocytopenia: Patient on Lovenox for DVT prophylaxis as per orthopedic surgery.  Follow CBC.    3.  Acute on chronic blood loss anemia from surgery:  Patient has received PRBC.  CBC for a.m. ordered   continue ferrous fumarate  4.  Depression: Continue Prozac  5.  Essential tremor: Continue primidone and propanolol  6.  BPH with acute urinary retention: Continue Flomax He will need Foley catheter at the time of discharge.  He will need voiding trial in 1 week.  He will be followed by Dr. Bernardo Heater at [facility] urology.  7.  Essential hypertension: Blood pressure is stable .   8.  Constipation: Patient had bowel movement  Management plans discussed with the patient and wife and they are in agreement.  CODE STATUS: full  TOTAL TIME TAKING CARE OF THIS PATIENT: 28 minutes.     [facility], DEPENDING ON CLINICAL CONDITION.   Kelby Lotspeich M.D on 09/20/2018 at 10:23 AM  Between 7am to 6pm - Pager - [phone] After 6pm go to [email or web address] - password [email or web address] [place] [facility]  Office  [phone]  CC: Primary care physician; [name], MD  Note: This dictation was prepared with Dragon dictation along with smaller phrase technology. Any transcriptional errors that result from this process are unintentional.

## 2018-09-20 NOTE — Progress Notes (Addendum)
Per MD patient will not D/C today. Plan is for patient to D/C to [facility] when medically stable. [facility] liaison is aware of above.   [employer], LCSW [phone]

## 2018-09-20 NOTE — Progress Notes (Signed)
Subjective: 4 Days Post-Op Procedure(s) (LRB): INTRAMEDULLARY (IM) NAIL INTERTROCHANTRIC (Right) Patient reports pain as mild.   Patient is well, and has had no acute complaints or problems Wife is present and asks numerous questions that were answered, very concerned about fluctuating blood numbers.  Concerned with bruising surrounding right hip. Plan is to go Rehab after hospital stay. no nausea and no vomiting Patient denies any chest pains or shortness of breath.  Objective: Vital signs in last 24 hours: Temp:  [97.4 F (36.3 C)-98.9 F (37.2 C)] 98.9 F (37.2 C) (12/24 2347) Pulse Rate:  [75-82] 82 (12/24 2347) Resp:  [14-18] 14 (12/24 2347) BP: (110-147)/(60-78) 147/78 (12/24 2347) SpO2:  [95 %-100 %] 98 % (12/24 2347) well approximated incision Heels are non tender and elevated off the bed using rolled towels Intake/Output from previous day: No intake/output data recorded. Intake/Output this shift: No intake/output data recorded.  Recent Labs    09/17/18 1932 09/18/18 0316 09/19/18 0437 09/19/18 1656  HGB 9.2* 8.2* 7.3* 8.8*   Recent Labs    09/18/18 0316 09/19/18 0437 09/19/18 1656  WBC 8.3 7.6  --   RBC 2.51* 2.27*  --   HCT 24.6* 22.4* 25.9*  PLT 86* 101*  --    Recent Labs    09/19/18 0437  NA 131*  K 4.2  CL 103  CO2 24  BUN 20  CREATININE 1.17  GLUCOSE 165*  CALCIUM 8.3*   No results for input(s): LABPT, INR in the last 72 hours.  EXAM General - Patient is Alert, Appropriate and Oriented Extremity - Neurovascular intact Intact pulses distally Dorsiflexion/Plantar flexion intact No cellulitis present Compartment soft  Right thigh swollen but not excessively or tight Bruising noted along the posterior aspect of the hip around hamstring. Dressing - dressing C/D/I Motor Function - intact, moving foot and toes well on exam.    Past Medical History:  Diagnosis Date  . Arthritis   . Atrial flutter ([place])   . Diabetes mellitus ([place])   .  Essential tremor   . Essential tremor    deep brain stimulator   . Hypercholesteremia   . Hypertension   . Incontinence   . 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 Sleep apnea   . Stroke [facility])     Assessment/Plan: 4 Days Post-Op Procedure(s) (LRB): INTRAMEDULLARY (IM) NAIL INTERTROCHANTRIC (Right) Active Problems:   Hip fracture (HCC)  Estimated body mass index is 29.3 kg/m as calculated from the following:   Height as of this encounter: 5' 8.75" (1.746 m).   Weight as of this encounter: 89.4 kg. Up with therapy Discharge to SNF when medically cleared.  From orthopedic standpoint he is cleared for transfer  Labs: Pt received 1 unit of PRBC yesterday, Hg 8.8 afterwards.  Will re-check this AM.  BP 147/78 last night No recent fevers this AM. Patient has had a bowel movement.  DVT Prophylaxis - Lovenox, Foot Pumps and TED hose Weight-Bearing as tolerated to right leg  Patient will need follow-up prior to clinic 2 weeks postop for staple removal Continue Lovenox injections 40 mg every 24 hours for 2 weeks postop  J. Cameron Proud, PA-C [place] 09/20/2018, 8:14 AM

## 2018-09-21 ENCOUNTER — Ambulatory Visit: Payer: Medicare Other | Admitting: Physical Therapy

## 2018-09-21 LAB — CBC
HCT: 25.2 % — ABNORMAL LOW (ref 39.0–52.0)
Hemoglobin: 8.3 g/dL — ABNORMAL LOW (ref 13.0–17.0)
MCH: 32.3 pg (ref 26.0–34.0)
MCHC: 32.9 g/dL (ref 30.0–36.0)
MCV: 98.1 fL (ref 80.0–100.0)
Platelets: 144 10*3/uL — ABNORMAL LOW (ref 150–400)
RBC: 2.57 MIL/uL — ABNORMAL LOW (ref 4.22–5.81)
RDW: 17.2 % — ABNORMAL HIGH (ref 11.5–15.5)
WBC: 8.2 10*3/uL (ref 4.0–10.5)
nRBC: 0 % (ref 0.0–0.2)

## 2018-09-21 MED ORDER — TRAMADOL HCL 50 MG PO TABS: 50.0000 mg | ORAL_TABLET | Freq: Four times a day (QID) | ORAL | 0 refills | Status: DC | PRN

## 2018-09-21 MED ORDER — PANTOPRAZOLE SODIUM 40 MG PO TBEC: 40.0000 mg | DELAYED_RELEASE_TABLET | Freq: Every day | ORAL | Status: DC

## 2018-09-21 MED ORDER — ASPIRIN EC 325 MG PO TBEC: 325.0000 mg | DELAYED_RELEASE_TABLET | Freq: Every day | ORAL | 3 refills | Status: AC

## 2018-09-21 MED ORDER — HYDROCODONE-ACETAMINOPHEN 5-325 MG PO TABS: 1.0000 | ORAL_TABLET | ORAL | 0 refills | Status: DC | PRN

## 2018-09-21 MED ORDER — ENOXAPARIN SODIUM 40 MG/0.4ML ~~LOC~~ SOLN: 40.0000 mg | SUBCUTANEOUS | 0 refills | Status: DC

## 2018-09-21 NOTE — Progress Notes (Signed)
Physical Therapy Treatment Patient Details Name: Mike Holloway MRN: [id number] DOB: 1941-08-04 Today's Date: 09/21/2018    History of Present Illness 77 yo male with onset of fracture falling on porch picking up a package from [place] was admitted with R intertrochanteric fracture and now with IM nailing 09/16/18.  PMHx:  cervical spine stimulator, deep brain stim, sleep apnea, lymphoma, DM, PN, tremors, a-flutter, stroke,     PT Comments    Pt agreeable to PT; pt spouse note pt feeling worse since taking Lovenox this a.m. Discussed with nursing/MD and is being addressed. Pt notes some dizziness in head, but wishes to work with PT and attempt up in bed. Pt continues to be Mod/Max A for bed mobility requiring a second person assist at times. Initially poor sitting balance, which progressed to [place] post time and assist/cues. Pt able to tolerated sitting edge of bed for quite some time to include some hygiene while sitting and LE exercises. Pt notes mild improvement in dizziness/swimming in head, but not cleared. Pt does wish to attempt stand. Pt unable to attain any stand even with elevation of bed and +2 support at L platform rolling walker; will do best to attempt in parallel bars with 2 person assist. Pt pleased with ability to change position and maintain for increased time. Max A of 2 to return to bed. Pt with current discharge order to skilled nursing facility to continue rehab efforts.    Follow Up Recommendations  SNF     Equipment Recommendations  None recommended by PT    Recommendations for Other Services       Precautions / Restrictions Precautions Precautions: Fall Restrictions Weight Bearing Restrictions: Yes RLE Weight Bearing: Weight bearing as tolerated    Mobility  Bed Mobility Overal bed mobility: Needs Assistance Bed Mobility: Supine to Sit;Sit to Supine     Supine to sit: Mod assist Sit to supine: Max assist;+2 for physical assistance   General bed mobility  comments: Effortful; pain in R hip/knee. Assist for LEs and trunk and scooting to edge of bed  Transfers Overall transfer level: Needs assistance Equipment used: Left platform walker Transfers: Sit to/from Stand Sit to Stand: Max assist;+2 physical assistance;From elevated surface         General transfer comment: unable to attain due to pain and significant weakness  Ambulation/Gait             General Gait Details: unable   Stairs             Wheelchair Mobility    Modified Rankin (Stroke Patients Only)       Balance   Sitting-balance support: Bilateral upper extremity supported;Feet supported Sitting balance-Leahy Scale: Fair(initially poor requiring Min A; improved with time) Sitting balance - Comments: sits EOB 36 min Postural control: Right lateral lean;Other (comment)(trunk flexed)   Standing balance-Leahy Scale: Zero                              Cognition Arousal/Alertness: Awake/alert(dizziness/malaise post taking Lovenox) Behavior During Therapy: WFL for tasks assessed/performed Overall Cognitive Status: Within Functional Limits for tasks assessed                                        Exercises General Exercises - Lower Extremity Ankle Circles/Pumps: AROM;Both;20 reps;Seated Quad Sets: Strengthening;Both;10 reps;Supine Gluteal Sets: Strengthening;Both;10 reps;Supine Long  Arc [employer];Right;10 reps;Seated;Other (comment)(L AROM; 2 sets each) Heel Slides: AAROM;Right;10 reps;Seated(some pain in knee) Hip ABduction/ADduction: AAROM;Both;20 reps;Seated Straight Leg Raises: AAROM;Both;10 reps;Supine Hip Flexion/Marching: AAROM;Both;20 reps;Seated Other Exercises Other Exercises: assisting to mobilize in bed with mini bridge and hip scoot and toward head of bed with LLE     General Comments        Pertinent Vitals/Pain Pain Assessment: Faces Faces Pain Scale: Hurts whole lot(With movement; bed  mobility) Pain Location: R hip and knee Pain Intervention(s): Limited activity within patient's tolerance;Monitored during session;Repositioned    Home Living                      Prior Function            PT Goals (current goals can now be found in the care plan section) Progress towards PT goals: Progressing toward goals(slowly)    Frequency    BID      PT Plan Current plan remains appropriate    Co-evaluation              AM-PAC PT "6 Clicks" Mobility   Outcome Measure  Help needed turning from your back to your side while in a flat bed without using bedrails?: A Lot Help needed moving from lying on your back to sitting on the side of a flat bed without using bedrails?: A Lot Help needed moving to and from a bed to a chair (including a wheelchair)?: Total Help needed standing up from a chair using your arms (e.g., wheelchair or bedside chair)?: Total Help needed to walk in hospital room?: Total Help needed climbing 3-5 steps with a railing? : Total 6 Click Score: 8    End of Session Equipment Utilized During Treatment: Gait belt Activity Tolerance: Patient limited by fatigue;Other (comment)(weakness; dizziness/swimming in head) Patient left: in bed;with call bell/phone within reach;with bed alarm set;with family/visitor present;with SCD's reapplied   PT Visit Diagnosis: Unsteadiness on feet (R26.81);Muscle weakness (generalized) (M62.81);Difficulty in walking, not elsewhere classified (R26.2);Pain Pain - Right/Left: Right Pain - part of body: Hip     Time: 1107-1210 PT Time Calculation (min) (ACUTE ONLY): 63 min  Charges:  $Therapeutic Exercise: 23-37 mins $Therapeutic Activity: 23-37 mins                      Larae Grooms, PTA 09/21/2018, 12:23 PM

## 2018-09-21 NOTE — Progress Notes (Signed)
EMS called for transport.

## 2018-09-21 NOTE — Care Management Important Message (Signed)
Important Message  Patient Details  Name: Mike Holloway MRN: [id number] Date of Birth: 1941/06/06   Medicare Important Message Given:  Yes    Juliann Pulse A Riggin Cuttino 09/21/2018, 11:25 AM

## 2018-09-21 NOTE — Progress Notes (Signed)
Report called and given to [place] at [facility]. Awaiting PT to see pt then will call EMS for transport.

## 2018-09-21 NOTE — Clinical Social Work Note (Addendum)
Patient to be d/c'ed today to [facility] room 702.  Patient and family agreeable to plans will transport via ems RN to call report [phone].  Patient's wife is at bedside and aware that patient is discharging today.  Evette Cristal, MSW, [place]

## 2018-09-21 NOTE — Clinical Social Work Placement (Signed)
   CLINICAL SOCIAL WORK PLACEMENT  NOTE  Date:  09/21/2018  Patient Details  Name: Mike Holloway MRN: [id number] Date of Birth: 12-02-1940  Clinical Social Work is seeking post-discharge placement for this patient at the [place] level of care (*CSW will initial, date and re-position this form in  chart as items are completed):  Yes   Patient/family provided with [place] Work Department's list of facilities offering this level of care within the geographic area requested by the patient (or if unable, by the patient's family).  Yes   Patient/family informed of their freedom to choose among providers that offer the needed level of care, that participate in Medicare, Medicaid or managed care program needed by the patient, have an available bed and are willing to accept the patient.  Yes   Patient/family informed of [place]'s ownership interest in [facility] and [facility], as well as of the fact that they are under no obligation to receive care at these facilities.  PASRR submitted to EDS on 09/17/18     PASRR number received on 09/17/18     Existing PASRR number confirmed on       FL2 transmitted to all facilities in geographic area requested by pt/family on 09/17/18     FL2 transmitted to all facilities within larger geographic area on       Patient informed that his/her managed care company has contracts with or will negotiate with certain facilities, including the following:        Yes   Patient/family informed of bed offers received.  Patient chooses bed at ([facility] )     Physician recommends and patient chooses bed at      Patient to be transferred to [facility] on 09/21/18.  Patient to be transferred to facility by [facility]     Patient family notified on 09/21/18 of transfer.  Name of family member notified:  Patient's wife was at bedside and is aware that patient is discharging today.      PHYSICIAN Please sign FL2, Please prepare prescriptions     Additional Comment:    _______________________________________________ Ross Ludwig, LCSWA 09/21/2018, 12:11 PM

## 2018-09-25 ENCOUNTER — Telehealth: Payer: Self-pay

## 2018-09-25 ENCOUNTER — Telehealth: Payer: Self-pay | Admitting: Urology

## 2018-09-25 NOTE — Telephone Encounter (Signed)
Pt's wife called to cancel Jan 7th appt, Pt fell and broke his hip and had surgery to repair, he is at [facility] for rehab. I left Wife a message to call if they need to reschedule appt at a later time.

## 2018-09-25 NOTE — Telephone Encounter (Signed)
Mike Holloway in scheduling sent me an email and stated that she tried to get Mike Holloway RUS scheduled but Mike Holloway daughter said that due to the fact that he had a broken femur she didn't want to schedule Mike Holloway [place], but it looks like they moved Mike Holloway follow up app with you up the 10-02-18. Does he need this prior to Mike Holloway app?   Sharyn Lull

## 2018-09-25 NOTE — Telephone Encounter (Signed)
Patient does not need to see me.  He had urinary retention during his hospitalization and has a Foley catheter.  He just needs a nurse visit/voiding trial since I just saw him.  The ultrasound can be done at a later date.

## 2018-09-26 NOTE — Telephone Encounter (Signed)
Placed note in Surgical history and under medications.

## 2018-09-28 ENCOUNTER — Encounter: Payer: Medicare Other | Admitting: Physical Therapy

## 2018-09-28 NOTE — Telephone Encounter (Signed)
Lm for pt to cb   Mike Holloway

## 2018-10-03 ENCOUNTER — Ambulatory Visit: Payer: Medicare Other | Admitting: Student in an Organized Health Care Education/Training Program

## 2018-10-04 ENCOUNTER — Encounter: Payer: Medicare Other | Admitting: Physical Therapy

## 2018-10-10 ENCOUNTER — Telehealth: Payer: Self-pay

## 2018-10-10 ENCOUNTER — Ambulatory Visit (INDEPENDENT_AMBULATORY_CARE_PROVIDER_SITE_OTHER): Payer: Medicare Other

## 2018-10-10 DIAGNOSIS — R339 Retention of urine, unspecified: Secondary | ICD-10-CM

## 2018-10-10 NOTE — Progress Notes (Signed)
Fill and Pull Catheter Removal  Patient is present today for a catheter removal.  Patient was cleaned and prepped in a sterile fashion 114ml of sterile water/ saline was instilled into the bladder when the patient felt the urge to urinate. 71ml of water was then drained from the balloon.  A 16FR foley cath was removed from the bladder no complications were noted .  Patient as then given some time to void on their own.  Patient was not able to urinate on their own after some time. Per Dr.Stoioff 16FR catheter placed back into bladder, patient was prepped and cleaned in a sterile fashion.  Patient tolerated well.  Preformed by: Mike Holloway, CMA (AAMA)  Follow up/ Additional notes: Patient to keep cath in placed until he is weight bearing as he is currently in [facility] for rehab. Will inform wife to call us when patient is released.

## 2018-10-10 NOTE — Telephone Encounter (Signed)
Called pt's wife informed her that per Dr.Stoioff we will be leaving the patient's cath in until he is weight bearing and able to ambulate. Pt and wife gave verbal understanding.

## 2018-10-11 ENCOUNTER — Encounter: Payer: Medicare Other | Admitting: Physical Therapy

## 2018-10-12 ENCOUNTER — Ambulatory Visit: Payer: Medicare Other | Admitting: Urology

## 2018-10-13 ENCOUNTER — Observation Stay
Admission: EM | Admit: 2018-10-13 | Discharge: 2018-10-15 | Disposition: A | Discharge status: Skilled Nursing Facility | Payer: Medicare Other | Attending: Internal Medicine | Admitting: Internal Medicine

## 2018-10-13 ENCOUNTER — Other Ambulatory Visit: Payer: Self-pay

## 2018-10-13 DIAGNOSIS — B9689 Other specified bacterial agents as the cause of diseases classified elsewhere: Secondary | ICD-10-CM | POA: Insufficient documentation

## 2018-10-13 DIAGNOSIS — Z888 Allergy status to other drugs, medicaments and biological substances status: Secondary | ICD-10-CM | POA: Diagnosis not present

## 2018-10-13 DIAGNOSIS — M25561 Pain in right knee: Secondary | ICD-10-CM | POA: Insufficient documentation

## 2018-10-13 DIAGNOSIS — E871 Hypo-osmolality and hyponatremia: Principal | ICD-10-CM | POA: Insufficient documentation

## 2018-10-13 DIAGNOSIS — G250 Essential tremor: Secondary | ICD-10-CM | POA: Insufficient documentation

## 2018-10-13 DIAGNOSIS — Z7982 Long term (current) use of aspirin: Secondary | ICD-10-CM | POA: Insufficient documentation

## 2018-10-13 DIAGNOSIS — I10 Essential (primary) hypertension: Secondary | ICD-10-CM | POA: Insufficient documentation

## 2018-10-13 DIAGNOSIS — I4892 Unspecified atrial flutter: Secondary | ICD-10-CM | POA: Diagnosis not present

## 2018-10-13 DIAGNOSIS — R531 Weakness: Secondary | ICD-10-CM

## 2018-10-13 DIAGNOSIS — R339 Retention of urine, unspecified: Secondary | ICD-10-CM | POA: Diagnosis not present

## 2018-10-13 DIAGNOSIS — Z960 Presence of urogenital implants: Secondary | ICD-10-CM | POA: Insufficient documentation

## 2018-10-13 DIAGNOSIS — G4733 Obstructive sleep apnea (adult) (pediatric): Secondary | ICD-10-CM | POA: Insufficient documentation

## 2018-10-13 DIAGNOSIS — Z8673 Personal history of transient ischemic attack (TIA), and cerebral infarction without residual deficits: Secondary | ICD-10-CM | POA: Insufficient documentation

## 2018-10-13 DIAGNOSIS — N3090 Cystitis, unspecified without hematuria: Secondary | ICD-10-CM

## 2018-10-13 DIAGNOSIS — R2689 Other abnormalities of gait and mobility: Secondary | ICD-10-CM | POA: Diagnosis not present

## 2018-10-13 DIAGNOSIS — Z882 Allergy status to sulfonamides status: Secondary | ICD-10-CM | POA: Diagnosis not present

## 2018-10-13 DIAGNOSIS — Z880 Allergy status to penicillin: Secondary | ICD-10-CM | POA: Diagnosis not present

## 2018-10-13 DIAGNOSIS — C8590 Non-Hodgkin lymphoma, unspecified, unspecified site: Secondary | ICD-10-CM | POA: Insufficient documentation

## 2018-10-13 DIAGNOSIS — G245 Blepharospasm: Secondary | ICD-10-CM | POA: Insufficient documentation

## 2018-10-13 DIAGNOSIS — F329 Major depressive disorder, single episode, unspecified: Secondary | ICD-10-CM | POA: Insufficient documentation

## 2018-10-13 DIAGNOSIS — Z79899 Other long term (current) drug therapy: Secondary | ICD-10-CM | POA: Insufficient documentation

## 2018-10-13 DIAGNOSIS — N3000 Acute cystitis without hematuria: Secondary | ICD-10-CM | POA: Insufficient documentation

## 2018-10-13 DIAGNOSIS — E119 Type 2 diabetes mellitus without complications: Secondary | ICD-10-CM | POA: Insufficient documentation

## 2018-10-13 DIAGNOSIS — E7800 Pure hypercholesterolemia, unspecified: Secondary | ICD-10-CM | POA: Insufficient documentation

## 2018-10-13 DIAGNOSIS — R2681 Unsteadiness on feet: Secondary | ICD-10-CM | POA: Insufficient documentation

## 2018-10-13 DIAGNOSIS — Z8249 Family history of ischemic heart disease and other diseases of the circulatory system: Secondary | ICD-10-CM | POA: Insufficient documentation

## 2018-10-13 DIAGNOSIS — M79604 Pain in right leg: Secondary | ICD-10-CM | POA: Insufficient documentation

## 2018-10-13 DIAGNOSIS — Z881 Allergy status to other antibiotic agents status: Secondary | ICD-10-CM | POA: Insufficient documentation

## 2018-10-13 DIAGNOSIS — Z978 Presence of other specified devices: Secondary | ICD-10-CM

## 2018-10-13 LAB — URINALYSIS, COMPLETE (UACMP) WITH MICROSCOPIC
Bilirubin Urine: NEGATIVE
Glucose, UA: NEGATIVE mg/dL
Ketones, ur: NEGATIVE mg/dL
Nitrite: POSITIVE — AB
Protein, ur: NEGATIVE mg/dL
Specific Gravity, Urine: 1.01 (ref 1.005–1.030)
Squamous Epithelial / HPF: NONE SEEN (ref 0–5)
pH: 6 (ref 5.0–8.0)

## 2018-10-13 LAB — BASIC METABOLIC PANEL
Anion gap: 8 (ref 5–15)
BUN: 21 mg/dL (ref 8–23)
CO2: 22 mmol/L (ref 22–32)
Calcium: 9.2 mg/dL (ref 8.9–10.3)
Chloride: 95 mmol/L — ABNORMAL LOW (ref 98–111)
Creatinine, Ser: 1.04 mg/dL (ref 0.61–1.24)
GFR calc Af Amer: >60 mL/min (ref >60)
GFR calc non Af Amer: >60 mL/min (ref >60)
Glucose, Bld: 137 mg/dL — ABNORMAL HIGH (ref 70–99)
Potassium: 4.2 mmol/L (ref 3.5–5.1)
Sodium: 125 mmol/L — ABNORMAL LOW (ref 135–145)

## 2018-10-13 LAB — CBC
HCT: 29.5 % — ABNORMAL LOW (ref 39.0–52.0)
Hemoglobin: 10.1 g/dL — ABNORMAL LOW (ref 13.0–17.0)
MCH: 33.4 pg (ref 26.0–34.0)
MCHC: 34.2 g/dL (ref 30.0–36.0)
MCV: 97.7 fL (ref 80.0–100.0)
Platelets: 136 10*3/uL — ABNORMAL LOW (ref 150–400)
RBC: 3.02 MIL/uL — ABNORMAL LOW (ref 4.22–5.81)
RDW: 16.5 % — ABNORMAL HIGH (ref 11.5–15.5)
WBC: 8.1 10*3/uL (ref 4.0–10.5)
nRBC: 0 % (ref 0.0–0.2)

## 2018-10-13 LAB — MAGNESIUM: Magnesium: 2.1 mg/dL (ref 1.7–2.4)

## 2018-10-13 LAB — PHOSPHORUS: Phosphorus: 2.4 mg/dL — ABNORMAL LOW (ref 2.5–4.6)

## 2018-10-13 MED ORDER — SODIUM CHLORIDE 0.9 % IV BOLUS
1000.0000 mL | Freq: Once | INTRAVENOUS | Status: AC
  Administered 2018-10-13: 1000 mL via INTRAVENOUS

## 2018-10-13 MED ORDER — SODIUM CHLORIDE 0.9 % IV SOLN
1.0000 g | Freq: Once | INTRAVENOUS | Status: AC
  Administered 2018-10-13: 1 g via INTRAVENOUS
  Filled 2018-10-13: qty 10

## 2018-10-13 NOTE — ED Notes (Signed)
Pt cleansed of stool.

## 2018-10-13 NOTE — ED Notes (Signed)
Report from kassie, rn

## 2018-10-13 NOTE — ED Triage Notes (Signed)
Patient arrives from [facility]. Patient is staying there for rehab for a right femur fracture. Today was patients first day out of bed with physical therapy. Patient c/o generalized weakness.

## 2018-10-14 ENCOUNTER — Observation Stay: Payer: Medicare Other

## 2018-10-14 DIAGNOSIS — E871 Hypo-osmolality and hyponatremia: Secondary | ICD-10-CM | POA: Diagnosis not present

## 2018-10-14 DIAGNOSIS — R531 Weakness: Secondary | ICD-10-CM

## 2018-10-14 LAB — BASIC METABOLIC PANEL
Anion gap: 4 — ABNORMAL LOW (ref 5–15)
BUN: 17 mg/dL (ref 8–23)
CO2: 25 mmol/L (ref 22–32)
Calcium: 9.1 mg/dL (ref 8.9–10.3)
Chloride: 100 mmol/L (ref 98–111)
Creatinine, Ser: 0.99 mg/dL (ref 0.61–1.24)
GFR calc Af Amer: >60 mL/min (ref >60)
GFR calc non Af Amer: >60 mL/min (ref >60)
Glucose, Bld: 103 mg/dL — ABNORMAL HIGH (ref 70–99)
Potassium: 4 mmol/L (ref 3.5–5.1)
Sodium: 129 mmol/L — ABNORMAL LOW (ref 135–145)

## 2018-10-14 LAB — CBC
HCT: 29.8 % — ABNORMAL LOW (ref 39.0–52.0)
Hemoglobin: 9.9 g/dL — ABNORMAL LOW (ref 13.0–17.0)
MCH: 33 pg (ref 26.0–34.0)
MCHC: 33.2 g/dL (ref 30.0–36.0)
MCV: 99.3 fL (ref 80.0–100.0)
Platelets: 121 10*3/uL — ABNORMAL LOW (ref 150–400)
RBC: 3 MIL/uL — ABNORMAL LOW (ref 4.22–5.81)
RDW: 16.8 % — ABNORMAL HIGH (ref 11.5–15.5)
WBC: 7.9 10*3/uL (ref 4.0–10.5)
nRBC: 0 % (ref 0.0–0.2)

## 2018-10-14 LAB — GLUCOSE, CAPILLARY
Glucose-Capillary: 99 mg/dL (ref 70–99)
Glucose-Capillary: 100 mg/dL — ABNORMAL HIGH (ref 70–99)
Glucose-Capillary: 127 mg/dL — ABNORMAL HIGH (ref 70–99)
Glucose-Capillary: 103 mg/dL — ABNORMAL HIGH (ref 70–99)

## 2018-10-14 MED ORDER — FLUOXETINE HCL 10 MG PO CAPS
10.0000 mg | ORAL_CAPSULE | Freq: Every day | ORAL | Status: DC
  Administered 2018-10-14 – 2018-10-15 (×2): 10 mg via ORAL
  Filled 2018-10-14 (×3): qty 1

## 2018-10-14 MED ORDER — PRIMIDONE 50 MG PO TABS
50.0000 mg | ORAL_TABLET | Freq: Every day | ORAL | Status: DC
  Administered 2018-10-14: 50 mg via ORAL
  Filled 2018-10-14 (×2): qty 1

## 2018-10-14 MED ORDER — INSULIN ASPART 100 UNIT/ML ~~LOC~~ SOLN: 0.0000 [IU] | Freq: Every day | SUBCUTANEOUS | Status: DC

## 2018-10-14 MED ORDER — PRIMIDONE 50 MG PO TABS: 50.0000 mg | ORAL_TABLET | Freq: Two times a day (BID) | ORAL | Status: DC

## 2018-10-14 MED ORDER — DICLOFENAC SODIUM 1 % TD GEL
2.0000 g | Freq: Two times a day (BID) | TRANSDERMAL | Status: DC | PRN
  Filled 2018-10-14: qty 100

## 2018-10-14 MED ORDER — ACETAMINOPHEN 325 MG PO TABS
650.0000 mg | ORAL_TABLET | Freq: Four times a day (QID) | ORAL | Status: DC | PRN
  Administered 2018-10-14 – 2018-10-15 (×2): 650 mg via ORAL
  Filled 2018-10-14 (×2): qty 2

## 2018-10-14 MED ORDER — INSULIN ASPART 100 UNIT/ML ~~LOC~~ SOLN
0.0000 [IU] | Freq: Three times a day (TID) | SUBCUTANEOUS | Status: DC
  Administered 2018-10-14: 1 [IU] via SUBCUTANEOUS
  Filled 2018-10-14 (×2): qty 1

## 2018-10-14 MED ORDER — PRIMIDONE 50 MG PO TABS
100.0000 mg | ORAL_TABLET | Freq: Every morning | ORAL | Status: DC
  Administered 2018-10-14 – 2018-10-15 (×2): 100 mg via ORAL
  Filled 2018-10-14 (×4): qty 2

## 2018-10-14 MED ORDER — TRIAMTERENE-HCTZ 37.5-25 MG PO TABS
1.0000 | ORAL_TABLET | Freq: Every day | ORAL | Status: DC
  Filled 2018-10-14: qty 1

## 2018-10-14 MED ORDER — PROPRANOLOL HCL ER 80 MG PO CP24
160.0000 mg | ORAL_CAPSULE | Freq: Every day | ORAL | Status: DC
  Administered 2018-10-14 – 2018-10-15 (×2): 160 mg via ORAL
  Filled 2018-10-14 (×4): qty 2

## 2018-10-14 MED ORDER — BACITRACIN-NEOMYCIN-POLYMYXIN OINTMENT TUBE
TOPICAL_OINTMENT | Freq: Two times a day (BID) | CUTANEOUS | Status: DC
  Administered 2018-10-15: 08:00:00 via TOPICAL
  Filled 2018-10-14: qty 14.17

## 2018-10-14 MED ORDER — MIRABEGRON ER 50 MG PO TB24
50.0000 mg | ORAL_TABLET | Freq: Every day | ORAL | Status: DC
  Administered 2018-10-14 – 2018-10-15 (×2): 50 mg via ORAL
  Filled 2018-10-14 (×3): qty 1

## 2018-10-14 MED ORDER — SODIUM CHLORIDE 0.9 % IV SOLN
1.0000 g | INTRAVENOUS | Status: DC
  Administered 2018-10-14: 1 g via INTRAVENOUS
  Filled 2018-10-14: qty 1
  Filled 2018-10-14: qty 10

## 2018-10-14 MED ORDER — FAMOTIDINE 20 MG PO TABS
20.0000 mg | ORAL_TABLET | Freq: Two times a day (BID) | ORAL | Status: DC
  Administered 2018-10-14 – 2018-10-15 (×3): 20 mg via ORAL
  Filled 2018-10-14 (×3): qty 1

## 2018-10-14 MED ORDER — HYDROCODONE-ACETAMINOPHEN 5-325 MG PO TABS: 1.0000 | ORAL_TABLET | ORAL | Status: DC | PRN

## 2018-10-14 MED ORDER — ONDANSETRON HCL 4 MG/2ML IJ SOLN: 4.0000 mg | Freq: Four times a day (QID) | INTRAMUSCULAR | Status: DC | PRN

## 2018-10-14 MED ORDER — SODIUM CHLORIDE 0.9 % IV SOLN
INTRAVENOUS | Status: AC
  Administered 2018-10-14: 05:00:00 via INTRAVENOUS

## 2018-10-14 MED ORDER — ONDANSETRON HCL 4 MG PO TABS: 4.0000 mg | ORAL_TABLET | Freq: Four times a day (QID) | ORAL | Status: DC | PRN

## 2018-10-14 MED ORDER — ASPIRIN EC 325 MG PO TBEC
325.0000 mg | DELAYED_RELEASE_TABLET | Freq: Every day | ORAL | Status: DC
  Administered 2018-10-14 – 2018-10-15 (×2): 325 mg via ORAL
  Filled 2018-10-14 (×2): qty 1

## 2018-10-14 MED ORDER — HYDROCODONE-ACETAMINOPHEN 5-325 MG PO TABS
1.0000 | ORAL_TABLET | ORAL | Status: DC | PRN
  Administered 2018-10-14 (×2): 2 via ORAL
  Filled 2018-10-14 (×2): qty 2

## 2018-10-14 MED ORDER — ACETAMINOPHEN 650 MG RE SUPP: 650.0000 mg | Freq: Four times a day (QID) | RECTAL | Status: DC | PRN

## 2018-10-14 MED ORDER — POLYETHYLENE GLYCOL 3350 17 G PO PACK
17.0000 g | PACK | Freq: Every day | ORAL | Status: DC | PRN
  Administered 2018-10-14: 17 g via ORAL
  Filled 2018-10-14: qty 1

## 2018-10-14 MED ORDER — ENOXAPARIN SODIUM 40 MG/0.4ML ~~LOC~~ SOLN: 40.0000 mg | SUBCUTANEOUS | Status: DC

## 2018-10-14 MED ORDER — TAMSULOSIN HCL 0.4 MG PO CAPS
0.4000 mg | ORAL_CAPSULE | Freq: Every day | ORAL | Status: DC
  Administered 2018-10-14 – 2018-10-15 (×2): 0.4 mg via ORAL
  Filled 2018-10-14 (×2): qty 1

## 2018-10-14 NOTE — Progress Notes (Signed)
Family Meeting Note  Advance Directive:yes  Today a meeting took place with the Patient and spouse.  Patient is able to participate.  The following clinical team members were present during this meeting:MD  The following were discussed:Patient's diagnosis: generalized weakness, Patient's progosis: Unable to determine and Goals for treatment: Full Code  Additional follow-up to be provided: prn  Time spent during discussion:20 minutes  Evette Doffing, MD

## 2018-10-14 NOTE — ED Notes (Signed)
Pt's spouse out to RN station to report "he feels like he's floating and he feels like the last 3 fingers on his right hand are numb and it's going up his arm." pt with strong grip noted to right hand, moves all fingers and is able to feel RN touching all right fingers. Pt moving right arm without difficulty. Dr. Mable Paris notified of pt's complaint. No new verbal orders received.

## 2018-10-14 NOTE — Clinical Social Work Note (Signed)
Clinical Social Work Assessment  Patient Details  Name: Mike Holloway MRN: [id number] Date of Birth: 01/28/1941  Date of referral:  10/14/18               Reason for consult:  Discharge Planning                Permission sought to share information with:  Family Supports, Facility Sport and exercise psychologist, Case Manager(Pat Pesqueira [phone]) Permission granted to share information::  Yes, Verbal Permission Granted  Name::     Mike Holloway, Wife, [phone]  Agency::  [facility] [phone]  Relationship::  Mike Holloway, wife, [phone]  Contact Information:  Thayer Headings, wife  Housing/Transportation Living arrangements for the past 2 months:  [place], [place] of Information:  Patient, Spouse([facility]) Patient Interpreter Needed:  None Criminal Activity/Legal Involvement Pertinent to Current Situation/Hospitalization:  No - Comment as needed Significant Relationships:  Spouse Lives with:    Do you feel safe going back to the place where you live?  Yes Need for family participation in patient care:  No (Coment)  Care giving concerns:  Family request for pt to discharge to [facility].    Social Worker assessment / plan:  Pt is a 78 y.o., Caucasian male who was admitted to the ED 10/13/2018, weakness. Pt from [facility]. CSW introduce herself to pt and his wife who was in the room at the time. Permission given to CSW to perform assessment. According to pt and his wife pt needs assistance with basic ADLs.  Pt has difficulty ambulating and uses a wheelchair.   Plan: Pt to return to [facility].   Employment status:  Retired Forensic scientist:  Medicare PT Recommendations:  [place] / Referral to community resources:  [place]  Patient/Family's Response to care:  Pt and family aware of discharge plan and all are in agreement.  Clinician called the [facility] and they are expecting pt to return to  [facility].   Patient/Family's Understanding of and Emotional Response to Diagnosis, Current Treatment, and Prognosis:  Pt and family demonstrate good understanding and agrees to plan.   Emotional Assessment Appearance:  Appears older than stated age Attitude/Demeanor/Rapport:  Other(Calm) Affect (typically observed):  Stable Orientation:  Oriented to Self, Oriented to  Time, Oriented to Place, Oriented to Situation Alcohol / Substance use:  Not Applicable Psych involvement (Current and /or in the community):     Discharge Needs  Concerns to be addressed:  Discharge Planning Concerns Readmission within the last 30 days:  No Current discharge risk:  Physical Impairment Barriers to Discharge:  Continued Medical Work up   [employer], LCSW 10/14/2018, 1:45 PM

## 2018-10-14 NOTE — ED Provider Notes (Signed)
[facility] Emergency Department Provider Note  ____________________________________________  Time seen: Approximately 12:02 AM  I have reviewed the triage vital signs and the nursing notes.   HISTORY  Chief Complaint Weakness    HPI Mike Holloway is a 78 y.o. male with a history of hypertension, diabetes, and recent right femur fracture for which he is currently being cared for at [facility] who was sent to the ED today due to generalized weakness.  The patient had been in his usual state of health, did physical therapy today during which she made a lot of progress and walked 8 steps which was significant for him.  Later in the afternoon he was profoundly weak, unable to stand up.  No focal weakness paresthesias headaches or vision changes.  No vomiting or diarrhea.  He has been eating normally.  On Macrobid chronically as urinary prophylaxis.  [facility] show an initial sodium of 122, most recently 127.  Baseline hemoglobin is 10      Past Medical History:  Diagnosis Date  . Arthritis   . Atrial flutter ([place])   . Diabetes mellitus ([place])   . Essential tremor   . Essential tremor    deep brain stimulator   . Hypercholesteremia   . Hypertension   . Incontinence   . 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 Sleep apnea   . Stroke [facility])      Patient Active Problem List   Diagnosis Date Noted  . Hip fracture ([place]) 09/15/2018  . 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 HLD (hyperlipidemia) 03/24/2018  . Contusion of right knee 02/14/2018  . Bradycardia 12/28/2017  . 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 Chronic pain of right knee 08/10/2016  . Right ankle pain 08/10/2016  . Venous insufficiency of both lower extremities 05/13/2016  . 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[place]) 12/11/2015  . 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 Lymphoma, 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[place]) 04/03/2015  . Breathlessness  on exertion 11/21/2014  . Breath shortness 11/21/2014  . Arthropathy 11/07/2014  . Atrial flutter, paroxysmal ([place]) 11/07/2014  . Type 2 diabetes mellitus ([place]) 11/07/2014  . Benign essential tremor 11/07/2014  . Benign essential HTN 11/07/2014  . Mixed incontinence 11/07/2014  . Hypercholesterolemia without hypertriglyceridemia 11/07/2014  . Apnea, sleep 11/07/2014  . Controlled type 2 diabetes mellitus without complication ([place]) 95/05/3266  . Pure hypercholesterolemia 11/07/2014  . Other abnormalities of gait and mobility 11/02/2011  . Abnormal gait 06/29/2011  . Discoordination 06/29/2011     Past Surgical History:  Procedure Laterality Date  . ABLATION    . APPENDECTOMY    . CATARACT EXTRACTION, BILATERAL    . COLONOSCOPY WITH PROPOFOL N/A 03/17/2018   Procedure: COLONOSCOPY WITH PROPOFOL;  Surgeon: Jonathon Bellows, MD;  Location[facility] END[facility]COPY;  Service: Gastroenterology;  Laterality: N/A;  . DEEP BRAIN STIMULATOR PLACEMENT    . 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 HEMORRHOID SURGERY    . HERNIA REPAIR    . HIP FRACTURE SURGERY    . INTRAMEDULLARY (IM) NAIL INTERTROCHANTERIC Right 09/16/2018   Procedure: INTRAMEDULLARY (IM) NAIL INTERTROCHANTRIC;  Surgeon: Hessie Knows, MD;  Location: [facility] ORS;  Service: Orthopedics;  Laterality: Right;  . NASAL SINUS SURGERY    . ORCHIECTOMY    . TONSILLECTOMY       Prior to Admission medications   Medication  Sig Start Date End Date Taking? Authorizing Provider  aspirin EC 325 MG tablet Take 1 tablet (325 mg total) by mouth daily. 09/21/18 09/21/19  Bettey Costa, MD  Cholecalciferol (VITAMIN D-1000 MAX ST) 1000 UNITS tablet Take 1,000 Units by mouth at bedtime.     [provider]  diclofenac sodium (VOLTAREN) 1 % GEL Apply 2 g topically 2 (two) times daily as needed (pain).     [provider]   famotidine (PEPCID) 20 MG tablet TK 1 T PO BID 05/20/18   [provider]  ferrous [id number] C-folic acid (TRINSICON / FOLTRIN) capsule Take 1 capsule by mouth 2 (two) times daily after a meal. 09/19/18   Bettey Costa, MD  FLUoxetine (PROZAC) 10 MG tablet Take 10 mg by mouth daily.    [provider]  HYDROcodone-acetaminophen (NORCO/VICODIN) 5-325 MG tablet Take 1-2 tablets by mouth every 4 (four) hours as needed for moderate pain (pain score 4-6). 09/21/18   Bettey Costa, MD  mirabegron ER (MYRBETRIQ) 50 MG TB24 tablet Take 1 tablet (50 mg total) by mouth daily. 05/11/18   Billey Co, MD  pantoprazole (PROTONIX) 40 MG tablet Take 1 tablet (40 mg total) by mouth daily. 09/21/18   Bettey Costa, MD  primidone (MYSOLINE) 50 MG tablet Take 50-100 mg by mouth 2 (two) times daily. 100 mg in the morning and 50 mg at bedtime    [provider]  propranolol ER (INDERAL LA) 80 MG 24 hr capsule Take 160 mg by mouth daily.     [provider]  tamsulosin (FLOMAX) 0.4 MG CAPS capsule Take 0.4 mg by mouth daily.    [provider]  traMADol (ULTRAM) 50 MG tablet Take 1 tablet (50 mg total) by mouth every 6 (six) hours as needed. 09/21/18   Bettey Costa, MD  triamterene-hydrochlorothiazide (MAXZIDE-25) 37.5-25 MG tablet TK 1 T PO QD 04/09/18   [provider]     Allergies Clindamycin; Flagyl [metronidazole]; Ambien  [zolpidem]; Penicillins; and Sulfa antibiotics   Family History  Problem Relation Age of Onset  . Hematuria Father   . Prostate cancer Father   . Heart disease Mother   . Kidney disease Neg Hx   . Bladder Cancer Neg Hx     Social History Social History   Tobacco Use  . Smoking status: Never Smoker  . Smokeless tobacco: Never Used  Substance Use Topics  . Alcohol use: No    Alcohol/week: 0.0 standard drinks  . Drug use: No    Review of Systems  Constitutional:   No fever or chills.  ENT:   No sore throat. No  rhinorrhea. Cardiovascular:   No chest pain or syncope. Respiratory:   No dyspnea or cough. Gastrointestinal:   Negative for abdominal pain, vomiting and diarrhea.  Musculoskeletal:   Negative for focal pain or swelling All other systems reviewed and are negative except as documented above in ROS and HPI.  ____________________________________________   PHYSICAL EXAM:  VITAL SIGNS: ED Triage Vitals [10/13/18 2052]  Enc Vitals Group     BP (!) 142/68     Pulse Rate 72     Resp 15     Temp 99.1 F (37.3 C)     Temp Source Oral     SpO2 97 %     Weight 190 lb (86.2 kg)     Height 5' 8.75" (1.746 m)     Head Circumference      [facility] Flow  Pain Score 0     Pain Loc      Pain Edu?      Excl. in [place]?     Vital signs reviewed, nursing assessments reviewed.   Constitutional:   Alert and oriented.  Ill-appearing Eyes:   Conjunctivae are normal. EOMI. PERRL. ENT      Head:   Normocephalic and atraumatic.      Nose:   No congestion/rhinnorhea.       Mouth/Throat:   MMM, no pharyngeal erythema. No peritonsillar mass.       Neck:   No meningismus. Full ROM. Hematological/Lymphatic/Immunilogical:   No cervical lymphadenopathy. Cardiovascular:   RRR. Symmetric bilateral radial and DP pulses.  No murmurs. Cap refill less than 2 seconds. Respiratory:   Normal respiratory effort without tachypnea/retractions. Breath sounds are clear and equal bilaterally. No wheezes/rales/rhonchi. Gastrointestinal:   Soft with suprapubic tenderness. Non distended. There is no CVA tenderness.  No rebound, rigidity, or guarding. Genitourinary:   Foley catheter in place.  Catheter tubing was pulled taut, and there is an associated tear of the penile meatus.  Small amount of blood, without active bleeding Musculoskeletal:   Normal range of motion in all extremities. No joint effusions.  No lower extremity tenderness.  No edema. Neurologic:   Normal speech and language.  Motor grossly intact. No acute  focal neurologic deficits are appreciated.  Skin:    Skin is warm, dry and intact. No rash noted.  No petechiae, purpura, or bullae.  ____________________________________________    LABS (pertinent positives/negatives) (all labs ordered are listed, but only abnormal results are displayed) Labs Reviewed  BASIC METABOLIC PANEL - Abnormal; Notable for the following components:      Result Value   Sodium 125 (*)    Chloride 95 (*)    Glucose, Bld 137 (*)    All other components within normal limits  CBC - Abnormal; Notable for the following components:   RBC 3.02 (*)    Hemoglobin 10.1 (*)    HCT 29.5 (*)    RDW 16.5 (*)    Platelets 136 (*)    All other components within normal limits  URINALYSIS, COMPLETE (UACMP) WITH MICROSCOPIC - Abnormal; Notable for the following components:   Color, Urine YELLOW (*)    APPearance CLEAR (*)    Hgb urine dipstick LARGE (*)    Nitrite POSITIVE (*)    Leukocytes, UA SMALL (*)    Bacteria, UA MANY (*)    All other components within normal limits  PHOSPHORUS - Abnormal; Notable for the following components:   Phosphorus 2.4 (*)    All other components within normal limits  URINE CULTURE  MAGNESIUM   ____________________________________________   EKG    ____________________________________________    RADIOLOGY  No results found.  ____________________________________________   PROCEDURES Procedures  ____________________________________________  DIFFERENTIAL DIAGNOSIS   UTI, hyponatremia, dehydration  CLINICAL IMPRESSION / ASSESSMENT AND PLAN / ED COURSE  Pertinent labs & imaging results that were available during my care of the patient were reviewed by me and considered in my medical decision making (see chart for details).    Patient presents with generalized weakness.  Vital signs unremarkable.  No symptoms to suggest dysrhythmia ACS PE dissection or pericarditis.  Labs to shows slight worsening of his hyponatremia.   Urinalysis also reveals a nitrite positive UTI.  His tubing has been changes 3 days ago and is clean so I think this urinalysis is reliable.  Ceftriaxone given, urine culture ordered.  Discussed with the patient and spouse at bedside, engaged in shared decision-making during which we agree that the patient would be better served by hospitalization here for further management, possibly including sodium replacement, following up urine culture, further work with PT, urology evaluation for his meatus injury.  Case discussed with the hospitalist.      ____________________________________________   FINAL CLINICAL IMPRESSION(S) / ED DIAGNOSES    Final diagnoses:  Cystitis  Chronic indwelling Foley catheter  Hyponatremia  Generalized weakness     ED Discharge Orders    None      Portions of this note were generated with dragon dictation software. Dictation errors may occur despite best attempts at proofreading.   Carrie Mew, MD 10/14/18 [phone]

## 2018-10-14 NOTE — ED Notes (Signed)
Explanation of admission procedure explained to pt and spouse who verbalize understanding.

## 2018-10-14 NOTE — Evaluation (Addendum)
Physical Therapy Evaluation Patient Details Name: Mike Holloway MRN: [id number] DOB: Dec 18, 1940 Today's Date: 10/14/2018   History of Present Illness  Mike Holloway is a 78yo male who comes to [facility] on 1/17 from [place] [facility]) after reporting generalized weakness throughout his body. Pt underwent Rt femur ORIF (WBAT) on December 21st and was at [facility] to improve independence with mobility. Pt continiues to require assistance for basic mobility and ADL. He has not been able to progress to AMB but for a single session PTA.   Clinical Impression  Pt admitted with above diagnosis. Pt currently with functional limitations due to the deficits listed below (see "PT Problem List"). Upon entry, pt in bed, wife present. The pt is awake and agreeable to participate. The pt is alert and oriented x3, pleasant, conversational, and following simple commands consistently. Speech remains slow, but per wife improved since arrival. Heavy physical assist for mobility to EOB and 3 transfers. Pain also very limiting. This is similar to how things have been since surgery on 12/21, but more weakness this date. Functional mobility assessment demonstrates increased effort/time requirements, poor tolerance, and need for physical assistance, whereas the patient performed these at a higher level of independence PTA. Pt will benefit from skilled PT intervention to increase independence and safety with basic mobility in preparation for discharge to the venue listed below.       Follow Up Recommendations SNF;Supervision for mobility/OOB;Supervision - Intermittent    Equipment Recommendations  None recommended by PT    Recommendations for Other Services       Precautions / Restrictions Precautions Precautions: Fall Restrictions Weight Bearing Restrictions: No      Mobility  Bed Mobility Overal bed mobility: Needs Assistance Bed Mobility: Supine to Sit     Supine to sit: Max assist     General bed mobility comments:  assist for trunk and for right leg   Transfers Overall transfer level: Needs assistance Equipment used: Rolling walker (2 wheeled);None;1 person hand held assist Transfers: Sit to/from Stand Sit to Stand: Mod assist;Max assist         General transfer comment: maxA dependent transfer, and modA RW transfer, extreme pain in right knee/thigh similar to state s/p ORIF on 12/21. Weakness in 4 limbs limits standing to <5 seconds. (3x total)  Ambulation/Gait                Stairs            Wheelchair Mobility    Modified Rankin (Stroke Patients Only)       Balance Overall balance assessment: Needs assistance         Standing balance support: During functional activity;Bilateral upper extremity supported Standing balance-Leahy Scale: Zero                               Pertinent Vitals/Pain Pain Assessment: Faces Faces Pain Scale: Hurts whole lot Pain Location: Right knee/thigh pain are very limiting with load bearing and mobility in bed.  Pain Intervention(s): Limited activity within patient's tolerance;Monitored during session;Premedicated before session;Repositioned    Home Living Family/patient expects to be discharged to:: Skilled nursing facility                 Additional Comments: has stairs to enter house    Prior Function Level of Independence: Independent with assistive device(s)         Comments: cane on R hand due to tremors and control  issues of L hand     Hand Dominance   Dominant Hand: Right    Extremity/Trunk Assessment   Upper Extremity Assessment Upper Extremity Assessment: Generalized weakness    Lower Extremity Assessment Lower Extremity Assessment: Generalized weakness    Cervical / Trunk Assessment Cervical / Trunk Assessment: Kyphotic  Communication   Communication: No difficulties  Cognition Arousal/Alertness: Awake/alert Behavior During Therapy: WFL for tasks assessed/performed Overall  Cognitive Status: Within Functional Limits for tasks assessed                                        General Comments      Exercises     Assessment/Plan    PT Assessment Patient needs continued PT services  PT Problem List Decreased strength;Decreased range of motion;Decreased activity tolerance;Decreased balance;Decreased mobility;Decreased knowledge of use of DME       PT Treatment Interventions DME instruction;Therapeutic exercise;Gait training;Stair training;Functional mobility training;Therapeutic activities;Patient/family education    PT Goals (Current goals can be found in the Care Plan section)  Acute Rehab PT Goals Patient Stated Goal: [facility] and continue with [facility] PT Goal Formulation: With patient Time For Goal Achievement: 10/21/18 Potential to Achieve Goals: Good    Frequency Min 2X/week   Barriers to discharge Inaccessible home environment;Decreased caregiver support      Co-evaluation               AM-PAC PT "6 Clicks" Mobility  Outcome Measure Help needed turning from your back to your side while in a flat bed without using bedrails?: Total Help needed moving from lying on your back to sitting on the side of a flat bed without using bedrails?: Total Help needed moving to and from a bed to a chair (including a wheelchair)?: Total Help needed standing up from a chair using your arms (e.g., wheelchair or bedside chair)?: Total Help needed to walk in hospital room?: Total Help needed climbing 3-5 steps with a railing? : Total 6 Click Score: 6    End of Session   Activity Tolerance: Patient limited by fatigue;Patient limited by pain Patient left: in bed;with bed alarm set;with call bell/phone within reach(seated at eOB with wife in room) Nurse Communication: Mobility status PT Visit Diagnosis: Unsteadiness on feet (R26.81);Other abnormalities of gait and mobility (R26.89);Muscle weakness (generalized) (M62.81);Pain Pain -  Right/Left: Right Pain - part of body: Leg;Knee    Time: 4010-2725 PT Time Calculation (min) (ACUTE ONLY): 22 min   Charges:   PT Evaluation $PT Eval Moderate Complexity: 1 Mod          12:53 PM, 10/14/18 Etta Grandchild, PT, DPT Physical Therapist - [facility]  [phone] ([place])    Shion Bluestein C 10/14/2018, 12:52 PM

## 2018-10-14 NOTE — ED Notes (Signed)
April RN, aware of bed assigned   

## 2018-10-14 NOTE — Care Management Obs Status (Signed)
[facility] NOTIFICATION   Patient Details  Name: Mike Holloway MRN: [id number] Date of Birth: 02/16/1941   Medicare Observation Status Notification Given:  Yes    Porschea Borys A Addysen Louth, RN 10/14/2018, 9:27 AM

## 2018-10-14 NOTE — ED Notes (Signed)
Pt and spouse updated on admission process.

## 2018-10-14 NOTE — ED Provider Notes (Signed)
I was asked by nursing to come speak with the patient.  The patient reports for the past 10 or 12 hours intermittent episodes of room spinning vertigo that lasted less than 1 minute at a time.  He describes it as "being on a boat and feeling like it is not stopping".  I examined his ears and are normal.  He is currently asymptomatic.  His symptoms are most consistent with peripheral vertigo.  He remained stable for admission.   Darel Hong, MD 10/14/18 [phone]

## 2018-10-14 NOTE — ED Notes (Signed)
Dr. Mable Paris in to reassess.

## 2018-10-14 NOTE — Clinical Social Work Note (Signed)
CSW is aware via verbal consult from [facility] that the patient admitted from [facility]. CSW will assess when able.  Santiago Bumpers, MSW, Latanya Presser [phone]

## 2018-10-14 NOTE — Progress Notes (Addendum)
[place] at [place] NAME: Mike Holloway    MR#:  [id number]  DATE OF BIRTH:  [birth year]  SUBJECTIVE:  CHIEF COMPLAINT:   Chief Complaint  Patient presents with  . Weakness   Weakness is slowly improving.  Not back to baseline yet.  REVIEW OF SYSTEMS:  Review of Systems  Constitutional: Positive for malaise/fatigue. Negative for chills and fever.  HENT: Negative for congestion, ear discharge, hearing loss and nosebleeds.   Eyes: Negative for blurred vision and double vision.  Respiratory: Negative for cough, shortness of breath and wheezing.   Cardiovascular: Negative for chest pain, palpitations and leg swelling.  Gastrointestinal: Negative for abdominal pain, constipation, diarrhea, nausea and vomiting.  Genitourinary: Negative for dysuria and hematuria.  Musculoskeletal: Positive for joint pain and myalgias.  Neurological: Negative for dizziness, focal weakness, seizures, weakness and headaches.  Psychiatric/Behavioral: Negative for depression.    DRUG ALLERGIES:   Allergies  Allergen Reactions  . Clindamycin Diarrhea    Contracted C. Diff x 2  . Flagyl [Metronidazole] Swelling    Swollen tongue, excessive shaking,    . Ambien  [Zolpidem]     Other reaction(s): Other (See Comments) Disoriented and moody Other reaction(s): Other (See Comments) Disoriented and moody  . Penicillins Rash    Has patient had a PCN reaction causing immediate rash, facial/tongue/throat swelling, SOB or lightheadedness with hypotension: Unknown Has patient had a PCN reaction causing severe rash involving mucus membranes or skin necrosis: Unknown Has patient had a PCN reaction that required hospitalization: No Has patient had a PCN reaction occurring within the last 10 years: No If all of the above answers are "NO", then may proceed with Cephalosporin use.  . Sulfa Antibiotics Rash    VITALS:  Blood pressure 140/71, pulse 69, temperature 98 F  (36.7 C), temperature source Axillary, resp. rate 18, height 5' 8.75" (1.746 m), weight 86.2 kg, SpO2 94 %.  PHYSICAL EXAMINATION:  Physical Exam   GENERAL:  78 y.o.-year-old patient sitting in the bed with no acute distress.  EYES: Pupils equal, round, reactive to light and accommodation. No scleral icterus. Extraocular muscles intact. -Frequent blepharospasms worse on the right side HEENT: Head atraumatic, normocephalic. Oropharynx and nasopharynx clear.  NECK:  Supple, no jugular venous distention. No thyroid enlargement, no tenderness.  LUNGS: Normal breath sounds bilaterally, no wheezing, rales,rhonchi or crepitation. No use of accessory muscles of respiration.  Decreased bibasilar breath sounds CARDIOVASCULAR: S1, S2 normal. No  rubs, or gallops.  2/6 systolic murmur is present ABDOMEN: Soft, nontender, nondistended. Bowel sounds present. No organomegaly or mass.  EXTREMITIES: No pedal edema, cyanosis, or clubbing.  Tonic left arm tremors. NEUROLOGIC: Cranial nerves II through XII are intact. Muscle strength 5/5 in all extremities. Sensation intact. Gait not checked.  Global weakness noted next is worse in the right leg and around the right side of the mouth. PSYCHIATRIC: The patient is alert and oriented x 3.  SKIN: No obvious rash, lesion, or ulcer.    LABORATORY PANEL:   CBC Recent Labs  Lab 10/14/18 0527  WBC 7.9  HGB 9.9*  HCT 29.8*  PLT 121*   ------------------------------------------------------------------------------------------------------------------  Chemistries  Recent Labs  Lab 10/13/18 2103 10/14/18 0527  NA 125* 129*  K 4.2 4.0  CL 95* 100  CO2 22 25  GLUCOSE 137* 103*  BUN 21 17  CREATININE 1.04 0.99  CALCIUM 9.2 9.1  MG 2.1  --    ------------------------------------------------------------------------------------------------------------------  Cardiac Enzymes  No results for input(s): TROPONINI in the last 168  hours. ------------------------------------------------------------------------------------------------------------------  RADIOLOGY:  Ct Head Wo Contrast  Result Date: 10/14/2018 CLINICAL DATA:  Speech difficulty and vertigo EXAM: CT HEAD WITHOUT CONTRAST TECHNIQUE: Contiguous axial images were obtained from the base of the skull through the vertex without intravenous contrast. COMPARISON:  None. FINDINGS: Brain: No acute hemorrhage. No extra-axial collection or mass effect. There is mild generalized volume loss. There are bifrontal approach deep brain stimulator leads with tips in the deep gray nuclei. There is an old left cerebellar infarct. Vascular: No abnormal hyperdensity of the major intracranial arteries or dural venous sinuses. No intracranial atherosclerosis. Skull: Bifrontal burr holes. Sinuses/Orbits: No fluid levels or advanced mucosal thickening of the visualized paranasal sinuses. No mastoid or middle ear effusion. The orbits are normal. IMPRESSION: 1. No acute intracranial abnormality. 2. Unchanged appearance of bifrontal approach deep brain stimulator leads. Electronically Signed   By: Ulyses Jarred M.D.   On: 10/14/2018 06:10    EKG:   Orders placed or performed during the hospital encounter of 09/15/18  . ED EKG  . 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  1.  Acute cystitis-follow-up urine cultures -Could have been causing his weakness -No history of urinary retention up until last month when he had the Foley catheter placed for his hip surgery.  Since then he failed 2 voiding trials the most recent 1 being last week as outpatient at urology office. -Continue IV  Rocephin for now. -He is on Flomax.  Continue to follow-up with urology. -Urethral meatus skin abrasions from the Foley catheter.  Apply Neosporin  2.  Hyponatremia-likely hypovolemic hyponatremia -Continue gentle hydration and monitor -Discontinue hydrochlorothiazide  3.  Essential tremors-continue follow-up with neurology as outpatient -Status post deep brain stimulator. -Continue primidone and propranolol  4.  DVT prophylaxis-Lovenox  Physical therapy consulted.  [facility].    All the records are reviewed and case discussed with Care Management/Social Workerr. Management plans discussed with the patient, family and they are in agreement.  CODE STATUS: Full code  TOTAL TIME TAKING CARE OF THIS PATIENT: 38 minutes.   POSSIBLE D/C IN 1-2 DAYS, DEPENDING ON CLINICAL CONDITION.   Gladstone Lighter M.D on 10/14/2018 at 11:17 AM  Between 7am to 6pm - Pager - [phone]  After 6pm go to [email or web address] - password [email or web address] [place] [facility]  Office  [phone]  CC: Primary care physician; [name], MD

## 2018-10-14 NOTE — H&P (Signed)
[place] at [place] NAME: Mike Holloway    MR#:  [id number]  DATE OF BIRTH:  11-Nov-1940  DATE OF ADMISSION:  10/13/2018  PRIMARY CARE PHYSICIAN: [name], MD   REQUESTING/REFERRING PHYSICIAN: Darel Hong, MD  CHIEF COMPLAINT:   Chief Complaint  Patient presents with  . Weakness    HISTORY OF PRESENT ILLNESS:  Mike Holloway  is a 78 y.o. male with a known history of hypertension, hyperlipidemia, type 2 diabetes, paroxysmal atrial flutter, Mike, history of stroke, chronic urinary retention with indwelling Foley who presented to the ED with generalized weakness.  He "feels like his whole body is moving and floating".  He denies any numbness, tingling, focal weakness.  His wife has not noted any slurred speech, but states that he is moving more slowly than normal.  He just recently underwent ORIF of the right femur on 09/16/18 for right hip fracture.  [facility]  Today was the first day he was able to walk.  In the ED, vitals were unremarkable.  Labs are significant for sodium 125, hemoglobin 10.1.  UA with large hemoglobin, small leukocytes, positive nitrites, many bacteria.  He was started on ceftriaxone.  Hospitalists were called for admission.  PAST MEDICAL HISTORY:   Past Medical History:  Diagnosis Date  . Arthritis   . Atrial flutter ([place])   . Diabetes mellitus ([place])   . Essential tremor   . Essential tremor    deep brain stimulator   . Hypercholesteremia   . Hypertension   . Incontinence   . 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 Sleep apnea   . Stroke [facility])     PAST SURGICAL HISTORY:   Past Surgical History:  Procedure Laterality Date  . ABLATION    . APPENDECTOMY    . CATARACT EXTRACTION, BILATERAL    . COLONOSCOPY WITH PROPOFOL N/A 03/17/2018   Procedure: COLONOSCOPY WITH PROPOFOL;  Surgeon: Jonathon Bellows, MD;  Location[facility] ENDOSCOPY;  Service: Gastroenterology;   Laterality: N/A;  . DEEP BRAIN STIMULATOR PLACEMENT    . 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 HEMORRHOID SURGERY    . HERNIA REPAIR    . HIP FRACTURE SURGERY    . INTRAMEDULLARY (IM) NAIL INTERTROCHANTERIC Right 09/16/2018   Procedure: INTRAMEDULLARY (IM) NAIL INTERTROCHANTRIC;  Surgeon: Hessie Knows, MD;  Location: [facility] ORS;  Service: Orthopedics;  Laterality: Right;  . NASAL SINUS SURGERY    . ORCHIECTOMY    . TONSILLECTOMY      SOCIAL HISTORY:   Social History   Tobacco Use  . Smoking status: Never Smoker  . Smokeless tobacco: Never Used  Substance Use Topics  . Alcohol use: No    Alcohol/week: 0.0 standard drinks    FAMILY HISTORY:   Family History  Problem Relation Age of Onset  . Hematuria Father   . Prostate cancer Father   . Heart disease Mother   . Kidney disease Neg Hx   . Bladder Cancer Neg Hx     DRUG ALLERGIES:   Allergies  Allergen Reactions  . Clindamycin Diarrhea    Contracted C. Diff x 2  . Flagyl [Metronidazole] Swelling    Swollen tongue, excessive shaking,    . Ambien  [Zolpidem]     Other reaction(s): Other (See Comments) Disoriented  and moody Other reaction(s): Other (See Comments) Disoriented and moody  . Penicillins Rash    Has patient had a PCN reaction causing immediate rash, facial/tongue/throat swelling, SOB or lightheadedness with hypotension: Unknown Has patient had a PCN reaction causing severe rash involving mucus membranes or skin necrosis: Unknown Has patient had a PCN reaction that required hospitalization: No Has patient had a PCN reaction occurring within the last 10 years: No If all of the above answers are "NO", then may proceed with Cephalosporin use.  . Sulfa Antibiotics Rash    REVIEW OF SYSTEMS:   Review of Systems  Constitutional: Positive for malaise/fatigue. Negative for chills and  fever.  HENT: Negative for congestion and sore throat.   Eyes: Negative for blurred vision and double vision.  Respiratory: Negative for cough and shortness of breath.   Cardiovascular: Negative for chest pain, palpitations and leg swelling.  Gastrointestinal: Negative for abdominal pain, nausea and vomiting.  Genitourinary: Negative for dysuria and urgency.  Musculoskeletal: Negative for back pain, falls, joint pain and neck pain.  Neurological: Positive for weakness. Negative for dizziness and headaches.  Psychiatric/Behavioral: Negative for depression. The patient is not nervous/anxious.     MEDICATIONS AT HOME:   Prior to Admission medications   Medication Sig Start Date End Date Taking? Authorizing Provider  acetaminophen (TYLENOL) 650 MG CR tablet Take 650 mg by mouth every 8 (eight) hours as needed for pain.   Yes [provider]  aspirin EC 325 MG tablet Take 1 tablet (325 mg total) by mouth daily. 09/21/18 09/21/19 Yes Mody, Ulice Bold, MD  Cholecalciferol (VITAMIN D-1000 MAX ST) 1000 UNITS tablet Take 1,000 Units by mouth at bedtime.    Yes [provider]  diclofenac sodium (VOLTAREN) 1 % GEL Apply 2 g topically 2 (two) times daily as needed (pain).    Yes [provider]  famotidine (PEPCID) 20 MG tablet TK 1 T PO BID 05/20/18  Yes [provider]  ferrous [id number] C-folic acid (TRINSICON / FOLTRIN) capsule Take 1 capsule by mouth 2 (two) times daily after a meal. 09/19/18  Yes Mody, Sital, MD  FLUoxetine (PROZAC) 10 MG tablet Take 10 mg by mouth daily.   Yes [provider]  HYDROcodone-acetaminophen (NORCO/VICODIN) 5-325 MG tablet Take 1-2 tablets by mouth every 4 (four) hours as needed for moderate pain (pain score 4-6). 09/21/18  Yes Mody, Ulice Bold, MD  mirabegron ER (MYRBETRIQ) 50 MG TB24 tablet Take 1 tablet (50 mg total) by mouth daily. 05/11/18  Yes Billey Co, MD  primidone (MYSOLINE) 50 MG tablet Take 50-100 mg by  mouth 2 (two) times daily. 100 mg in the morning and 50 mg at bedtime   Yes [provider]  Probiotic Product (VSL#3) CAPS Take 2 capsules by mouth daily.   Yes [provider]  propranolol ER (INDERAL LA) 80 MG 24 hr capsule Take 160 mg by mouth daily.    Yes [provider]  tamsulosin (FLOMAX) 0.4 MG CAPS capsule Take 0.4 mg by mouth daily.   Yes [provider]  pantoprazole (PROTONIX) 40 MG tablet Take 1 tablet (40 mg total) by mouth daily. Patient not taking: Reported on 10/14/2018 09/21/18   Bettey Costa, MD  traMADol (ULTRAM) 50 MG tablet Take 1 tablet (50 mg total) by mouth every 6 (six) hours as needed. Patient not taking: Reported on 10/14/2018 09/21/18   Bettey Costa, MD  triamterene-hydrochlorothiazide (MAXZIDE-25) 37.5-25 MG tablet TK 1 T PO QD 04/09/18   [provider]      VITAL SIGNS:  Blood pressure (!) 142/75, pulse 73, temperature 99.1 F (37.3 C), temperature source Oral, resp. rate 15, height 5' 8.75" (1.746 m), weight 86.2 kg, SpO2 97 %.  PHYSICAL EXAMINATION:  Physical Exam  GENERAL:  78 y.o.-year-old patient lying in the bed with no acute distress.  EYES: Pupils equal, round, reactive to light and accommodation. No scleral icterus. Extraocular muscles intact.  HEENT: Head atraumatic, normocephalic. Oropharynx and nasopharynx clear.  NECK:  Supple, no jugular venous distention. No thyroid enlargement, no tenderness.  LUNGS: Normal breath sounds bilaterally, no wheezing, rales,rhonchi or crepitation. No use of accessory muscles of respiration.  CARDIOVASCULAR: RRR, S1, S2 normal. No murmurs, rubs, or gallops.  ABDOMEN: Soft, nontender, nondistended. Bowel sounds present. No organomegaly or mass.  EXTREMITIES: No pedal edema, cyanosis, or clubbing. + Well-healing surgical incision present on the right lateral hip, no drainage or erythema. NEUROLOGIC: Cranial nerves II through XII are intact. +global weakness. Sensation intact.  Gait not checked. + Slowed speech and movement.  No focal deficits. PSYCHIATRIC: The patient is alert and oriented x 3.  SKIN: No obvious rash, lesion, or ulcer.   LABORATORY PANEL:   CBC Recent Labs  Lab 10/13/18 2103  WBC 8.1  HGB 10.1*  HCT 29.5*  PLT 136*   ------------------------------------------------------------------------------------------------------------------  Chemistries  Recent Labs  Lab 10/13/18 2103  NA 125*  K 4.2  CL 95*  CO2 22  GLUCOSE 137*  BUN 21  CREATININE 1.04  CALCIUM 9.2  MG 2.1   ------------------------------------------------------------------------------------------------------------------  Cardiac Enzymes No results for input(s): TROPONINI in the last 168 hours. ------------------------------------------------------------------------------------------------------------------  RADIOLOGY:  No results found.    IMPRESSION AND PLAN:   Generalized weakness- likely secondary to deconditioning in the setting of recent right hip fracture and hyponatremia.  Patient also endorsing slow speech and movement. No focal deficits. -Will obtain CT head to assess for stroke, given concern for "slow speech" -Unable to obtain MRI brain due to patient having deep brain stimulator -Consider neurology consult if no improvement -PT consult   UTI vs asymptomatic bacteriuria with indwelling foley- unsure if this is a true UTI. Patient is afebrile, has a normal WBC count, and is not having any symptoms. -Continue ceftriaxone for now -Urology consult -Urine culture pending  Chronic hyponatremia- Na 125 (baseline 131-133) -IVFs -Hold triamterene-HCTZ and would not restart on discharge  Recent right hip fracture- had an ORIF on 09/16/18 -Needs to f/u with ortho as an outpatient -PT consult  Chronic urinary retention with indwelling foley- just had foley changed 1/14 after failing voiding trial at urology office. -Continue myrbetriq and  flomax  Paroxysmal atrial flutter- in NSR here. -Not on anticoagulation at home  History of essential tremor with deep brain stimulator in place -Continue primidone   Hypertension- BPs normotensive in the ED -Hold triamterene-HCTZ due to hyponatremia  Depression- stable -Continue home prozac  All the records are reviewed and case discussed with ED provider. Management plans discussed with the patient, family and they are in agreement.  CODE STATUS: Full  TOTAL TIME TAKING CARE OF THIS PATIENT: 45 minutes.    Berna Spare Kruz Chiu M.D on 10/14/2018 at 1:56 AM  Between 7am to 6pm - Pager - [phone]  After 6pm go to [email or web address] - Proofreader  [facility] [place] [facility]  Office  [phone]  CC: Primary care physician; [name], MD   Note: This dictation was prepared with Dragon dictation along with smaller phrase technology.  Any transcriptional errors that result from this process are unintentional.

## 2018-10-15 DIAGNOSIS — E871 Hypo-osmolality and hyponatremia: Secondary | ICD-10-CM | POA: Diagnosis not present

## 2018-10-15 LAB — GLUCOSE, CAPILLARY
Glucose-Capillary: 91 mg/dL (ref 70–99)
Glucose-Capillary: 100 mg/dL — ABNORMAL HIGH (ref 70–99)

## 2018-10-15 LAB — BASIC METABOLIC PANEL
Anion gap: 4 — ABNORMAL LOW (ref 5–15)
BUN: 21 mg/dL (ref 8–23)
CO2: 24 mmol/L (ref 22–32)
Calcium: 9 mg/dL (ref 8.9–10.3)
Chloride: 102 mmol/L (ref 98–111)
Creatinine, Ser: 1.18 mg/dL (ref 0.61–1.24)
GFR calc Af Amer: >60 mL/min (ref >60)
GFR calc non Af Amer: 60 mL/min — ABNORMAL LOW (ref >60)
Glucose, Bld: 99 mg/dL (ref 70–99)
Potassium: 4.5 mmol/L (ref 3.5–5.1)
Sodium: 130 mmol/L — ABNORMAL LOW (ref 135–145)

## 2018-10-15 MED ORDER — AMLODIPINE BESYLATE 2.5 MG PO TABS: 2.5000 mg | ORAL_TABLET | Freq: Every day | ORAL | 1 refills | Status: DC

## 2018-10-15 MED ORDER — CEPHALEXIN 500 MG PO CAPS: 500.0000 mg | ORAL_CAPSULE | Freq: Three times a day (TID) | ORAL | 0 refills | Status: AC

## 2018-10-15 NOTE — Discharge Instructions (Signed)
1. Please check a repeat sodium level in blood in 1 week from discharge 2. Urine sodium sodium level in 1 week from discharge 3. Fluid restriction of about 1500cc/day 4.  Note that the blood pressure medication has been changed. 5.  Apply Neosporin to the urethral meatus at least daily

## 2018-10-15 NOTE — Progress Notes (Signed)
EMS called for transportation.  

## 2018-10-15 NOTE — Progress Notes (Signed)
Patient discharged with EMS.

## 2018-10-15 NOTE — NC FL2 (Signed)
[place] LEVEL OF CARE SCREENING TOOL     IDENTIFICATION  Patient Name: Mike Holloway Birthdate: Nov 09, 1940 Sex: male Admission Date (Current Location): 10/13/2018  [place] and [place] Number:  Engineering geologist and Address:  [facility], [address], [place], [place] [address]      Provider Number: [id number]  Attending Physician Name and Address:  Gladstone Lighter, MD  Relative Name and Phone Number:  Brycen Bean [facility]) [phone]    Current Level of Care: Hospital Recommended Level of Care: [place] Prior Approval Number:    Date Approved/Denied:   PASRR Number: [id number]  Discharge Plan: SNF    Current Diagnoses: Patient Active Problem List   Diagnosis Date Noted  . Generalized weakness 10/14/2018  . Hip fracture ([place]) 09/15/2018  . 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 HLD (hyperlipidemia) 03/24/2018  . Contusion of right knee 02/14/2018  . Bradycardia 12/28/2017  . 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 Chronic pain of right knee 08/10/2016  . Right ankle pain 08/10/2016  . Venous insufficiency of both lower extremities 05/13/2016  . 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[place]) 12/11/2015  . 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 Lymphoma, 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[place]) 04/03/2015  . Breathlessness on exertion 11/21/2014  . Breath shortness 11/21/2014  . Arthropathy 11/07/2014  . Atrial flutter, paroxysmal ([place]) 11/07/2014  . Type 2 diabetes mellitus ([place]) 11/07/2014  . Benign essential tremor 11/07/2014  . Benign essential HTN 11/07/2014  . Mixed incontinence 11/07/2014  . Hypercholesterolemia without hypertriglyceridemia 11/07/2014  . Apnea, sleep 11/07/2014  . Controlled type 2 diabetes mellitus without complication ([place]) 41/32/4401  . Pure hypercholesterolemia 11/07/2014  . Other abnormalities of gait and mobility 11/02/2011  . Abnormal gait 06/29/2011  .  Discoordination 06/29/2011    Orientation RESPIRATION BLADDER Height & Weight     Self, Time, Situation, Place  Normal Continent Weight: 190 lb (86.2 kg) Height:  5' 8.75" (174.6 cm)  BEHAVIORAL SYMPTOMS/MOOD NEUROLOGICAL BOWEL NUTRITION STATUS      Continent Diet(Heart healthy/carb modified)  AMBULATORY STATUS COMMUNICATION OF NEEDS Skin   Extensive Assist Verbally Normal                       Personal Care Assistance Level of Assistance  Bathing, Feeding, Dressing Bathing Assistance: Limited assistance Feeding assistance: Independent Dressing Assistance: Limited assistance Total Care Assistance: Maximum assistance   Functional Limitations Info  Sight, Hearing, Speech Sight Info: Adequate Hearing Info: Adequate Speech Info: Adequate    SPECIAL CARE FACTORS FREQUENCY  PT (By licensed PT), OT (By licensed OT)     PT Frequency: Up to 5X per week OT Frequency: Up to 3X per week            Contractures Contractures Info: Not present    Additional Factors Info  Code Status, Allergies Code Status Info: Full Allergies Info: Clindamycin, Flagyl Metronidazole, Ambien  Zolpidem, Penicillins, Sulfa Antibiotics           Current Medications (10/15/2018):  This is the current hospital active medication list Current Facility-Administered Medications  Medication Dose Route Frequency Provider Last Rate Last Dose  . acetaminophen (TYLENOL) tablet 650 mg  650 mg Oral Q6H PRN Sela Hua, MD   650 mg at 10/15/18 0820   Or  . acetaminophen (TYLENOL) suppository 650 mg  650 mg Rectal Q6H PRN Mayo, Pete Pelt, MD      . aspirin EC tablet 325 mg  325 mg Oral Daily  Mayo, Pete Pelt, MD   325 mg at 10/15/18 [id number]  . cefTRIAXone (ROCEPHIN) 1 g in sodium chloride 0.9 % 100 mL IVPB  1 g Intravenous Q24H Mayo, Pete Pelt, MD   Stopped at 10/14/18 2236  . diclofenac sodium (VOLTAREN) 1 % transdermal gel 2 g  2 g Topical BID PRN Mayo, Pete Pelt, MD      . enoxaparin (LOVENOX) injection  40 mg  40 mg Subcutaneous Q24H Mayo, Pete Pelt, MD      . famotidine (PEPCID) tablet 20 mg  20 mg Oral BID Sela Hua, MD   20 mg at 10/15/18 0811  . FLUoxetine (PROZAC) capsule 10 mg  10 mg Oral Daily Mayo, Pete Pelt, MD   10 mg at 10/15/18 0815  . HYDROcodone-acetaminophen (NORCO/VICODIN) 5-325 MG per tablet 1-2 tablet  1-2 tablet Oral Q4H PRN Gladstone Lighter, MD   2 tablet at 10/14/18 1947  . insulin aspart (novoLOG) injection 0-5 Units  0-5 Units Subcutaneous QHS Mayo, Pete Pelt, MD      . insulin aspart (novoLOG) injection 0-9 Units  0-9 Units Subcutaneous TID WC Mayo, Pete Pelt, MD   1 Units at 10/14/18 1646  . mirabegron ER (MYRBETRIQ) tablet 50 mg  50 mg Oral Daily Mayo, Pete Pelt, MD   50 mg at 10/15/18 0815  . neomycin-bacitracin-polymyxin (NEOSPORIN) ointment   Topical BID Gladstone Lighter, MD      . ondansetron [facility]) tablet 4 mg  4 mg Oral Q6H PRN Mayo, Pete Pelt, MD       Or  . ondansetron [facility]) injection 4 mg  4 mg Intravenous Q6H PRN Mayo, Pete Pelt, MD      . polyethylene glycol (MIRALAX / GLYCOLAX) packet 17 g  17 g Oral Daily PRN Sela Hua, MD   17 g at 10/14/18 1638  . primidone (MYSOLINE) tablet 100 mg  100 mg Oral q morning - 10a Mayo, Pete Pelt, MD   100 mg at 10/15/18 0815   And  . primidone (MYSOLINE) tablet 50 mg  50 mg Oral QHS Mayo, Pete Pelt, MD   50 mg at 10/14/18 2202  . propranolol ER (INDERAL LA) 24 hr capsule 160 mg  160 mg Oral Daily Mayo, Pete Pelt, MD   160 mg at 10/15/18 0814  . tamsulosin (FLOMAX) capsule 0.4 mg  0.4 mg Oral Daily Mayo, Pete Pelt, MD   0.4 mg at 10/15/18 5329     Discharge Medications: Please see discharge summary for a list of discharge medications.  Relevant Imaging Results:  Relevant Lab Results:   Additional Information [id number]  Zettie Pho, LCSW

## 2018-10-15 NOTE — Discharge Summary (Signed)
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 Arthritis   . Atrial flutter ([place])   . Diabetes mellitus ([place])   . Essential tremor   . Essential tremor    deep brain stimulator   . Hypercholesteremia   . Hypertension   . Incontinence   . 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 Sleep apnea   . 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  1.  Acute cystitis-urine cultures growing gram-negative rods -Responding well to Rocephin here at this time.  Will discharge on oral Keflex. -Could have been causing his weakness -No history of urinary retention up until last month when he had the Foley catheter placed for his hip surgery.  Since then he failed 2 voiding trials the most recent 1 being last week as outpatient at urology office. -He is on Flomax.  Continue to follow-up with urology. -Urethral meatus skin abrasions from the Foley catheter.  Apply Neosporin  2.  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 -Received gentle hydration.  Has been discontinued.  Sodium is up to 130  today -Fluid restriction recommended of about 1500 cc/day.  Repeat sodium level in 1 week and also check urine sodium at the time -Discontinue hydrochlorothiazide  3.  Essential tremors-continue follow-up with neurology as outpatient -Status post deep brain stimulator. -Continue primidone and propranolol  4.  Hypertension-since triamterene, hydrochlorothiazide has been discontinued at discharge, added low-dose Norvasc at this time.   Patient is currently at [facility].  Will be discharged back today  DISCHARGE CONDITIONS:   Guarded  CONSULTS OBTAINED:   Treatment Team:  Irine Seal, MD  DRUG ALLERGIES:   Allergies  Allergen Reactions  . Clindamycin Diarrhea    Contracted C. Diff x 2  . Flagyl [Metronidazole] Swelling    Swollen tongue, excessive shaking,    . Ambien  [Zolpidem]     Other reaction(s): Other (See Comments) Disoriented and moody Other reaction(s): Other (See Comments) Disoriented and moody  . Penicillins Rash    Has patient had a PCN reaction causing immediate rash, facial/tongue/throat swelling, SOB or lightheadedness with hypotension: Unknown Has patient had a PCN reaction causing severe rash involving mucus membranes or skin necrosis: Unknown Has patient had a PCN reaction that required hospitalization: No Has patient had a PCN reaction occurring within the last 10 years: No If all of the above answers are "NO", then may proceed with Cephalosporin use.  . Sulfa Antibiotics Rash   DISCHARGE MEDICATIONS:   Allergies as of 10/15/2018      Reactions   Clindamycin Diarrhea   Contracted C. Diff x 2   Flagyl [metronidazole] Swelling   Swollen tongue,  excessive shaking,     Ambien  [zolpidem]    Other reaction(s): Other (See Comments) Disoriented and moody Other reaction(s): Other (See Comments) Disoriented and moody   Penicillins Rash   Has patient had a PCN reaction causing immediate rash, facial/tongue/throat swelling, SOB or lightheadedness  with hypotension: Unknown Has patient had a PCN reaction causing severe rash involving mucus membranes or skin necrosis: Unknown Has patient had a PCN reaction that required hospitalization: No Has patient had a PCN reaction occurring within the last 10 years: No If all of the above answers are "NO", then may proceed with Cephalosporin use.   Sulfa Antibiotics Rash      Medication List    STOP taking these medications   traMADol 50 MG tablet Commonly known as:  ULTRAM   triamterene-hydrochlorothiazide 37.5-25 MG tablet Commonly known as:  MAXZIDE-25     TAKE these medications   acetaminophen 650 MG CR tablet Commonly known as:  TYLENOL Take 650 mg by mouth every 8 (eight) hours as needed for pain.   amLODipine 2.5 MG tablet Commonly known as:  NORVASC Take 1 tablet (2.5 mg total) by mouth daily.   aspirin EC 325 MG tablet Take 1 tablet (325 mg total) by mouth daily.   cephALEXin 500 MG capsule Commonly known as:  KEFLEX Take 1 capsule (500 mg total) by mouth 3 (three) times daily for 7 days.   diclofenac sodium 1 % Gel Commonly known as:  VOLTAREN Apply 2 g topically 2 (two) times daily as needed (pain).   famotidine 20 MG tablet Commonly known as:  PEPCID TK 1 T PO BID   ferrous [id number] C-folic acid capsule Commonly known as:  TRINSICON / FOLTRIN Take 1 capsule by mouth 2 (two) times daily after a meal.   FLUoxetine 10 MG tablet Commonly known as:  PROZAC Take 10 mg by mouth daily.   HYDROcodone-acetaminophen 5-325 MG tablet Commonly known as:  NORCO/VICODIN Take 1-2 tablets by mouth every 4 (four) hours as needed for moderate pain (pain score 4-6).   mirabegron ER 50 MG Tb24 tablet Commonly known as:  MYRBETRIQ Take 1 tablet (50 mg total) by mouth daily.   pantoprazole 40 MG tablet Commonly known as:  PROTONIX Take 1 tablet (40 mg total) by mouth daily.   primidone 50 MG tablet Commonly known as:  MYSOLINE Take 50-100 mg by mouth 2 (two)  times daily. 100 mg in the morning and 50 mg at bedtime   propranolol ER 80 MG 24 hr capsule Commonly known as:  INDERAL LA Take 160 mg by mouth daily.   tamsulosin 0.4 MG Caps capsule Commonly known as:  FLOMAX Take 0.4 mg by mouth daily.   VITAMIN D-1000 MAX ST 25 MCG (1000 UT) tablet Generic drug:  Cholecalciferol Take 1,000 Units by mouth at bedtime.   VSL#3 Caps Take 2 capsules by mouth daily.        DISCHARGE INSTRUCTIONS:   1. PCP f/u in 1-2 weeks 2. Repeat BMP in 1 week, please also check a urine sodium level in 1 week 3. Fluid restriction of about 1500cc/day 4. Urology f/u in 1 week 5.  Apply Neosporin to the urethral meatus daily  DIET:   Cardiac diet  ACTIVITY:   Activity as tolerated  OXYGEN:   Home Oxygen: No.  Oxygen Delivery: room air  DISCHARGE LOCATION:   nursing home   If you experience worsening of your admission symptoms, develop shortness of breath, life threatening emergency, suicidal or homicidal  thoughts you must seek medical attention immediately by calling 911 or calling your MD immediately  if symptoms less severe.  You Must read complete instructions/literature along with all the possible adverse reactions/side effects for all the Medicines you take and that have been prescribed to you. Take any new Medicines after you have completely understood and accpet all the possible adverse reactions/side effects.   Please note  You were cared for by a hospitalist during your hospital stay. If you have any questions about your discharge medications or the care you received while you were in the hospital after you are discharged, you can call the unit and asked to speak with the hospitalist on call if the hospitalist that took care of you is not available. Once you are discharged, your primary care physician will handle any further medical issues. Please note that NO REFILLS for any discharge medications will be authorized once you are discharged,  as it is imperative that you return to your primary care physician (or establish a relationship with a primary care physician if you do not have one) for your aftercare needs so that they can reassess your need for medications and monitor your lab values.    On the day of Discharge:  VITAL SIGNS:   Blood pressure 133/60, pulse 67, temperature 98.3 F (36.8 C), temperature source Oral, resp. rate 18, height 5' 8.75" (1.746 m), weight 86.2 kg, SpO2 100 %.  PHYSICAL EXAMINATION:    GENERAL:  78 y.o.-year-old patient sitting in the bed with no acute distress.  EYES: Pupils equal, round, reactive to light and accommodation. No scleral icterus. Extraocular muscles intact. -Frequent blepharospasms worse on the right side HEENT: Head atraumatic, normocephalic. Oropharynx and nasopharynx clear.  NECK:  Supple, no jugular venous distention. No thyroid enlargement, no tenderness.  LUNGS: Normal breath sounds bilaterally, no wheezing, rales,rhonchi or crepitation. No use of accessory muscles of respiration.  Decreased bibasilar breath sounds CARDIOVASCULAR: S1, S2 normal. No  rubs, or gallops.  2/6 systolic murmur is present ABDOMEN: Soft, nontender, nondistended. Bowel sounds present. No organomegaly or mass.  EXTREMITIES: No pedal edema, cyanosis, or clubbing.  Tonic left arm tremors. NEUROLOGIC: Cranial nerves II through XII are intact. Muscle strength 5/5 in all extremities. Sensation intact. Gait not checked.  Global weakness noted next is worse in the right leg and around the right side of the mouth. PSYCHIATRIC: The patient is alert and oriented x 3.  SKIN: No obvious rash, lesion, or ulcer.    DATA REVIEW:   CBC Recent Labs  Lab 10/14/18 0527  WBC 7.9  HGB 9.9*  HCT 29.8*  PLT 121*    Chemistries  Recent Labs  Lab 10/13/18 2103  10/15/18 0324  NA 125*   < > 130*  K 4.2   < > 4.5  CL 95*   < > 102  CO2 22   < > 24  GLUCOSE 137*   < > 99  BUN 21   < > 21  CREATININE 1.04    < > 1.18  CALCIUM 9.2   < > 9.0  MG 2.1  --   --    < > = values in this interval not displayed.     Microbiology Results  Results for orders placed or performed during the hospital encounter of 10/13/18  Urine culture     Status: Abnormal (Preliminary result)   Collection Time: 10/13/18  9:16 PM  Result Value Ref Range Status   Specimen Description  Final    URINE, RANDOM Performed at [facility], [address] [place], [place] [address]    Special Requests   Final    NONE Performed at [facility], [address] [place], [place] [address]    Culture >=100,000 COLONIES/mL KLEBSIELLA OXYTOCA (A)  Final   Report Status PENDING  Incomplete    RADIOLOGY:  No results found.   Management plans discussed with the patient, family and they are in agreement.  CODE STATUS:     Code Status Orders  (From admission, onward)         Start     Ordered   10/14/18 0225  Full code  Continuous     10/14/18 0224        Code Status History    Date Active Date Inactive Code Status Order ID Comments User Context   09/15/2018 1831 09/21/2018 1628 Full Code [id number]  Harrie Foreman, MD Inpatient   03/25/2018 0111 03/29/2018 2049 Full Code [id number]  Lance Coon, MD Inpatient      TOTAL TIME TAKING CARE OF THIS PATIENT: 38 minutes.    Gladstone Lighter M.D on 10/15/2018 at 10:54 AM  Between 7am to 6pm - Pager - [phone]  After 6pm go to [email or web address] - Proofreader  [facility]  Office  [phone]  CC: Primary care physician; [name], MD   Note: This dictation was prepared with Dragon dictation along with smaller phrase technology. Any transcriptional errors that result from this process are unintentional.

## 2018-10-15 NOTE — Clinical Social Work Note (Signed)
The patient will discharge back to [facility], today, via non-emergent EMS. The patient and facility are aware and in agreement. The CSW has sent all discharge information to the facility and will deliver the discharge packet as soon as possible. Once the discharge packet has been delivered, the CSW will sign off. Please consult should needs arise.  Santiago Bumpers, MSW, Latanya Presser [phone]

## 2018-10-16 LAB — URINE CULTURE: Culture: >=100000 — AB

## 2018-10-19 ENCOUNTER — Encounter: Payer: Medicare Other | Admitting: Physical Therapy

## 2018-10-25 ENCOUNTER — Ambulatory Visit (INDEPENDENT_AMBULATORY_CARE_PROVIDER_SITE_OTHER): Payer: Medicare Other | Admitting: Urology

## 2018-10-25 ENCOUNTER — Encounter: Payer: Self-pay | Admitting: Urology

## 2018-10-25 ENCOUNTER — Other Ambulatory Visit: Payer: Self-pay | Admitting: Radiology

## 2018-10-25 ENCOUNTER — Encounter: Payer: Medicare Other | Admitting: Physical Therapy

## 2018-10-25 VITALS — BP 127/79 | HR 80

## 2018-10-25 DIAGNOSIS — T8389XA Other specified complication of genitourinary prosthetic devices, implants and grafts, initial encounter: Secondary | ICD-10-CM

## 2018-10-25 DIAGNOSIS — R339 Retention of urine, unspecified: Secondary | ICD-10-CM | POA: Diagnosis not present

## 2018-10-25 DIAGNOSIS — T83511A Infection and inflammatory reaction due to indwelling urethral catheter, initial encounter: Secondary | ICD-10-CM

## 2018-10-25 DIAGNOSIS — N390 Urinary tract infection, site not specified: Secondary | ICD-10-CM | POA: Diagnosis not present

## 2018-10-25 DIAGNOSIS — N368 Other specified disorders of urethra: Secondary | ICD-10-CM

## 2018-10-25 NOTE — Progress Notes (Signed)
10/25/2018 2:19 PM   [name] 1941/05/17 [id number]  Referring provider: [name], MD [place], [place] [address]  Chief Complaint  Patient presents with  . Hospitalization Follow-up    HPI: 78 year old male developed urinary retention after fall/hip fracture and surgical repair.  He has failed 2 voiding trials and has not been able to bear weight.  It was recommended his Foley catheter remain indwelling until he was ambulatory.  He was hospitalized at [facility] and discharged on 1/19 for a urinary tract infection.  He was noted at that visit to have mild catheter erosion at the meatus.  He is currently in rehab at [facility].  He has just started bearing weight with a walker but is not yet ambulatory.   PMH: Past Medical History:  Diagnosis Date  . Arthritis   . Atrial flutter ([place])   . Diabetes mellitus ([place])   . Essential tremor   . Essential tremor    deep brain stimulator   . Hypercholesteremia   . Hypertension   . Incontinence   . 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 Sleep apnea   . Stroke [facility])     Surgical History: Past Surgical History:  Procedure Laterality Date  . ABLATION    . APPENDECTOMY    . CATARACT EXTRACTION, BILATERAL    . COLONOSCOPY WITH PROPOFOL N/A 03/17/2018   Procedure: COLONOSCOPY WITH PROPOFOL;  Surgeon: Jonathon Bellows, MD;  Location: [facility] ENDOSCOPY;  Service: Gastroenterology;  Laterality: N/A;  . DEEP BRAIN STIMULATOR PLACEMENT    . 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 HEMORRHOID SURGERY    . HERNIA REPAIR    . HIP FRACTURE SURGERY    . INTRAMEDULLARY (IM) NAIL INTERTROCHANTERIC Right 09/16/2018   Procedure: INTRAMEDULLARY (IM) NAIL INTERTROCHANTRIC;  Surgeon: Hessie Knows, MD;  Location: [facility] ORS;  Service: Orthopedics;  Laterality: Right;  . NASAL SINUS  SURGERY    . ORCHIECTOMY    . TONSILLECTOMY      Home Medications:  Allergies as of 10/25/2018      Reactions   Clindamycin Diarrhea   Contracted C. Diff x 2   Flagyl [metronidazole] Swelling   Swollen tongue, excessive shaking,     Ambien  [zolpidem]    Other reaction(s): Other (See Comments) Disoriented and moody Other reaction(s): Other (See Comments) Disoriented and moody   Penicillins Rash   Has patient had a PCN reaction causing immediate rash, facial/tongue/throat swelling, SOB or lightheadedness with hypotension: Unknown Has patient had a PCN reaction causing severe rash involving mucus membranes or skin necrosis: Unknown Has patient had a PCN reaction that required hospitalization: No Has patient had a PCN reaction occurring within the last 10 years: No If all of the above answers are "NO", then may proceed with Cephalosporin use.   Sulfa Antibiotics Rash      Medication List       Accurate as of October 25, 2018  2:19 PM. Always use your most recent med list.        acetaminophen 650 MG CR tablet Commonly known as:  TYLENOL Take 650 mg by mouth every 8 (eight) hours as needed for pain.   aspirin EC 325 MG tablet Take 1 tablet (325 mg total) by mouth daily.   diclofenac sodium 1 % Gel Commonly known as:  VOLTAREN Apply 2  g topically 2 (two) times daily as needed (pain).   famotidine 20 MG tablet Commonly known as:  PEPCID TK 1 T PO BID   ferrous [id number] C-folic acid capsule Commonly known as:  TRINSICON / FOLTRIN Take 1 capsule by mouth 2 (two) times daily after a meal.   FLUoxetine 10 MG capsule Commonly known as:  PROZAC   HYDROcodone-acetaminophen 5-325 MG tablet Commonly known as:  NORCO/VICODIN Take 1-2 tablets by mouth every 4 (four) hours as needed for moderate pain (pain score 4-6).   mirabegron ER 50 MG Tb24 tablet Commonly known as:  MYRBETRIQ Take 1 tablet (50 mg total) by mouth daily.   pantoprazole 40 MG tablet Commonly  known as:  PROTONIX Take 1 tablet (40 mg total) by mouth daily.   primidone 50 MG tablet Commonly known as:  MYSOLINE Take 50-100 mg by mouth 2 (two) times daily. 100 mg in the morning and 50 mg at bedtime   propranolol ER 80 MG 24 hr capsule Commonly known as:  INDERAL LA Take 160 mg by mouth daily.   tamsulosin 0.4 MG Caps capsule Commonly known as:  FLOMAX Take 0.4 mg by mouth daily.   traMADol 50 MG tablet Commonly known as:  ULTRAM Take by mouth.   VITAMIN D3 PO Take by mouth.   VSL#3 Caps Take 2 capsules by mouth daily.       Allergies:  Allergies  Allergen Reactions  . Clindamycin Diarrhea    Contracted C. Diff x 2  . Flagyl [Metronidazole] Swelling    Swollen tongue, excessive shaking,    . Ambien  [Zolpidem]     Other reaction(s): Other (See Comments) Disoriented and moody Other reaction(s): Other (See Comments) Disoriented and moody  . Penicillins Rash    Has patient had a PCN reaction causing immediate rash, facial/tongue/throat swelling, SOB or lightheadedness with hypotension: Unknown Has patient had a PCN reaction causing severe rash involving mucus membranes or skin necrosis: Unknown Has patient had a PCN reaction that required hospitalization: No Has patient had a PCN reaction occurring within the last 10 years: No If all of the above answers are "NO", then may proceed with Cephalosporin use.  . Sulfa Antibiotics Rash    Family History: Family History  Problem Relation Age of Onset  . Hematuria Father   . Prostate cancer Father   . Heart disease Mother   . Kidney disease Neg Hx   . Bladder Cancer Neg Hx     Social History:  reports that he has never smoked. He has never used smokeless tobacco. He reports that he does not drink alcohol or use drugs.  ROS: UROLOGY Frequent Urination?: Yes Hard to postpone urination?: Yes Burning/pain with urination?: No Get up at night to urinate?: No Leakage of urine?: No Urine stream starts and  stops?: Yes Trouble starting stream?: Yes Do you have to strain to urinate?: Yes Blood in urine?: No Urinary tract infection?: No Sexually transmitted disease?: No Injury to kidneys or bladder?: No Painful intercourse?: No Weak stream?: Yes Erection problems?: Yes Penile pain?: No  Gastrointestinal Nausea?: No Vomiting?: No Indigestion/heartburn?: No Diarrhea?: No Constipation?: No  Constitutional Fever: No Night sweats?: No Weight loss?: No Fatigue?: No  Skin Skin rash/lesions?: No Itching?: No  Eyes Blurred vision?: No Double vision?: No  Ears/Nose/Throat Sore throat?: No Sinus problems?: No  Hematologic/Lymphatic Swollen glands?: No Easy bruising?: No  Cardiovascular Leg swelling?: No Chest pain?: No  Respiratory Cough?: No Shortness of breath?: No  Endocrine  Excessive thirst?: No  Musculoskeletal Back pain?: No Joint pain?: No  Neurological Headaches?: No Dizziness?: No  Psychologic Depression?: No Anxiety?: No  Physical Exam: BP 127/79 (BP Location: Left Arm, Patient Position: Sitting, Cuff Size: Large)   Pulse 80   Constitutional:  Alert and oriented, No acute distress. HEENT: [place] AT, moist mucus membranes.  Trachea midline, no masses. Cardiovascular: No clubbing, cyanosis, or edema. Respiratory: Normal respiratory effort, no increased work of breathing. GU: No CVA tenderness.  Foley catheter in place.  The catheter was on traction and reposition.  There is erosion of the catheter through the meatus distally with erythema and inflammatory change.   Assessment & Plan:    1. Urinary retention Repeat voiding trial once he is ambulatory with a walker  2.  Meatal erosion Will schedule SP tube placement by interventional radiology.   Abbie Sons, [place] [address], [place], [place] [address] [phone]

## 2018-10-25 NOTE — Patient Instructions (Signed)

## 2018-10-26 ENCOUNTER — Other Ambulatory Visit: Payer: Self-pay

## 2018-10-26 ENCOUNTER — Ambulatory Visit
Admission: RE | Admit: 2018-10-26 | Discharge: 2018-10-26 | Disposition: A | Discharge status: Home / Self Care | Payer: Medicare Other | Source: Ambulatory Visit | Attending: Urology | Admitting: Urology

## 2018-10-26 ENCOUNTER — Ambulatory Visit
Admission: RE | Admit: 2018-10-26 | Discharge: 2018-10-26 | Disposition: A | Discharge status: Home / Self Care | Payer: Medicare Other | Source: Ambulatory Visit | Attending: Interventional Radiology | Admitting: Interventional Radiology

## 2018-10-26 ENCOUNTER — Ambulatory Visit: Payer: Medicare Other | Admitting: Urology

## 2018-10-26 DIAGNOSIS — I10 Essential (primary) hypertension: Secondary | ICD-10-CM | POA: Diagnosis not present

## 2018-10-26 DIAGNOSIS — E119 Type 2 diabetes mellitus without complications: Secondary | ICD-10-CM | POA: Insufficient documentation

## 2018-10-26 DIAGNOSIS — Z8572 Personal history of non-Hodgkin lymphomas: Secondary | ICD-10-CM | POA: Insufficient documentation

## 2018-10-26 DIAGNOSIS — Z841 Family history of disorders of kidney and ureter: Secondary | ICD-10-CM | POA: Insufficient documentation

## 2018-10-26 DIAGNOSIS — Z888 Allergy status to other drugs, medicaments and biological substances status: Secondary | ICD-10-CM | POA: Diagnosis not present

## 2018-10-26 DIAGNOSIS — Z8042 Family history of malignant neoplasm of prostate: Secondary | ICD-10-CM | POA: Insufficient documentation

## 2018-10-26 DIAGNOSIS — Z881 Allergy status to other antibiotic agents status: Secondary | ICD-10-CM | POA: Insufficient documentation

## 2018-10-26 DIAGNOSIS — Z8673 Personal history of transient ischemic attack (TIA), and cerebral infarction without residual deficits: Secondary | ICD-10-CM | POA: Diagnosis not present

## 2018-10-26 DIAGNOSIS — Z882 Allergy status to sulfonamides status: Secondary | ICD-10-CM | POA: Diagnosis not present

## 2018-10-26 DIAGNOSIS — Z880 Allergy status to penicillin: Secondary | ICD-10-CM | POA: Diagnosis not present

## 2018-10-26 DIAGNOSIS — N3946 Mixed incontinence: Secondary | ICD-10-CM | POA: Diagnosis not present

## 2018-10-26 DIAGNOSIS — N368 Other specified disorders of urethra: Secondary | ICD-10-CM | POA: Diagnosis not present

## 2018-10-26 DIAGNOSIS — T8389XA Other specified complication of genitourinary prosthetic devices, implants and grafts, initial encounter: Secondary | ICD-10-CM | POA: Diagnosis not present

## 2018-10-26 DIAGNOSIS — G250 Essential tremor: Secondary | ICD-10-CM | POA: Diagnosis not present

## 2018-10-26 DIAGNOSIS — R339 Retention of urine, unspecified: Secondary | ICD-10-CM | POA: Diagnosis not present

## 2018-10-26 DIAGNOSIS — I4892 Unspecified atrial flutter: Secondary | ICD-10-CM | POA: Insufficient documentation

## 2018-10-26 DIAGNOSIS — M1990 Unspecified osteoarthritis, unspecified site: Secondary | ICD-10-CM | POA: Diagnosis not present

## 2018-10-26 DIAGNOSIS — Z7982 Long term (current) use of aspirin: Secondary | ICD-10-CM | POA: Insufficient documentation

## 2018-10-26 DIAGNOSIS — Z79899 Other long term (current) drug therapy: Secondary | ICD-10-CM | POA: Diagnosis not present

## 2018-10-26 DIAGNOSIS — G4730 Sleep apnea, unspecified: Secondary | ICD-10-CM | POA: Diagnosis not present

## 2018-10-26 DIAGNOSIS — Z8249 Family history of ischemic heart disease and other diseases of the circulatory system: Secondary | ICD-10-CM | POA: Insufficient documentation

## 2018-10-26 DIAGNOSIS — Z842 Family history of other diseases of the genitourinary system: Secondary | ICD-10-CM | POA: Insufficient documentation

## 2018-10-26 MED ORDER — MIDAZOLAM HCL 5 MG/5ML IJ SOLN
INTRAMUSCULAR | Status: AC
  Filled 2018-10-26: qty 5

## 2018-10-26 MED ORDER — FENTANYL CITRATE (PF) 100 MCG/2ML IJ SOLN
INTRAMUSCULAR | Status: AC | PRN
  Administered 2018-10-26: 50 ug via INTRAVENOUS

## 2018-10-26 MED ORDER — MIDAZOLAM HCL 5 MG/5ML IJ SOLN
INTRAMUSCULAR | Status: AC | PRN
  Administered 2018-10-26 (×2): 1 mg via INTRAVENOUS

## 2018-10-26 MED ORDER — FENTANYL CITRATE (PF) 100 MCG/2ML IJ SOLN
INTRAMUSCULAR | Status: AC
  Filled 2018-10-26: qty 4

## 2018-10-26 NOTE — Consult Note (Signed)
Chief Complaint: Urinary retention  Referring Physician(s): Stoioff,Scott C  Patient Status: [facility] - Out-pt  History of Present Illness: Mike Holloway is a 78 y.o. 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  As such, request made for placement of an image guided suprapubic catheter for urinary diversion purposes.  The patient is accompanied by his wife who assists with the provided history.  Patient is currently without complaint.  Specifically, no chest pain, fever or chills.  Past Medical History:  Diagnosis Date  . Arthritis   . Atrial flutter ([place])   . Diabetes mellitus ([place])   . Essential tremor   . Essential tremor    deep brain stimulator   . Hypercholesteremia   . Hypertension   . Incontinence   . 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[place])    grew on the testical  . Sleep apnea   . Stroke [facility])     Past Surgical History:  Procedure Laterality Date  . ABLATION    . APPENDECTOMY    . CATARACT EXTRACTION, BILATERAL    . COLONOSCOPY WITH PROPOFOL N/A 03/17/2018   Procedure: COLONOSCOPY WITH PROPOFOL;  Surgeon: Jonathon Bellows, MD;  Location[facility] ENDOSCOPY;  Service: Gastroenterology;  Laterality: N/A;  . DEEP BRAIN STIMULATOR PLACEMENT    . 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 HEMORRHOID SURGERY    . HERNIA REPAIR    . HIP FRACTURE SURGERY    . INTRAMEDULLARY (IM) NAIL INTERTROCHANTERIC Right 09/16/2018   Procedure: INTRAMEDULLARY (IM) NAIL INTERTROCHANTRIC;  Surgeon: Hessie Knows, MD;  Location: [facility] ORS;  Service: Orthopedics;  Laterality: Right;  . NASAL SINUS SURGERY    . ORCHIECTOMY    .  TONSILLECTOMY      Allergies: Clindamycin; Flagyl [metronidazole]; Ambien  [zolpidem]; Penicillins; and Sulfa antibiotics  Medications: Prior to Admission medications   Medication Sig Start Date End Date Taking? Authorizing Provider  acetaminophen (TYLENOL) 650 MG CR tablet Take 650 mg by mouth every 8 (eight) hours as needed for pain.   Yes [provider]  aspirin EC 325 MG tablet Take 1 tablet (325 mg total) by mouth daily. 09/21/18 09/21/19 Yes Bettey Costa, MD  Cholecalciferol (VITAMIN D3 PO) Take by mouth.   Yes [provider]  famotidine (PEPCID) 20 MG tablet TK 1 T PO BID 05/20/18  Yes [provider]  ferrous [id number] C-folic acid (TRINSICON / FOLTRIN) capsule Take 1 capsule by mouth 2 (two) times daily after a meal. 09/19/18  Yes Mody, Sital, MD  FLUoxetine (PROZAC) 10 MG capsule Take 10 mg by mouth daily.  09/09/18  Yes [provider]  HYDROcodone-acetaminophen (NORCO/VICODIN) 5-325 MG tablet Take 1-2 tablets by mouth every 4 (four) hours as needed for moderate pain (pain score 4-6). 09/21/18  Yes Mody, Ulice Bold, MD  mirabegron ER (MYRBETRIQ) 50 MG TB24 tablet Take 1 tablet (50 mg total) by mouth daily. 05/11/18  Yes Billey Co, MD  primidone (MYSOLINE) 50 MG tablet Take 50-100 mg by mouth 2 (two) times daily. 100 mg in the morning and 50 mg at bedtime   Yes [provider]  Probiotic Product (VSL#3) CAPS Take 2 capsules by mouth daily.   Yes [provider]  propranolol ER (INDERAL LA) 80  MG 24 hr capsule Take 160 mg by mouth daily.    Yes [provider]  tamsulosin (FLOMAX) 0.4 MG CAPS capsule Take 0.4 mg by mouth daily.   Yes [provider]  diclofenac sodium (VOLTAREN) 1 % GEL Apply 2 g topically 2 (two) times daily as needed (pain).     [provider]  pantoprazole (PROTONIX) 40 MG tablet Take 1 tablet (40 mg total) by mouth daily. Patient not taking: Reported on 10/26/2018 09/21/18    Bettey Costa, MD  traMADol Veatrice Bourbon) 50 MG tablet Take by mouth. 09/21/18   [provider]     Family History  Problem Relation Age of Onset  . Hematuria Father   . Prostate cancer Father   . Heart disease Mother   . Kidney disease Neg Hx   . Bladder Cancer Neg Hx     Social History   Socioeconomic History  . Marital status: Married    Spouse name: Not on file  . Number of children: Not on file  . Years of education: Not on file  . Highest education level: Not on file  Occupational History  . Not on file  Social Needs  . Financial resource strain: Not on file  . Food insecurity:    Worry: Not on file    Inability: Not on file  . Transportation needs:    Medical: Not on file    Non-medical: Not on file  Tobacco Use  . Smoking status: Never Smoker  . Smokeless tobacco: Never Used  Substance and Sexual Activity  . Alcohol use: No    Alcohol/week: 0.0 standard drinks  . Drug use: No  . Sexual activity: Yes  Lifestyle  . Physical activity:    Days per week: Not on file    Minutes per session: Not on file  . Stress: Not on file  Relationships  . Social connections:    Talks on phone: Not on file    Gets together: Not on file    Attends religious service: Not on file    Active member of club or organization: Not on file    Attends meetings of clubs or organizations: Not on file    Relationship status: Not on file  Other Topics Concern  . Not on file  Social History Narrative  . Not on file    ECOG Status: 2 - Symptomatic, <50% confined to bed  Review of Systems: A 12 point ROS discussed and pertinent positives are indicated in the HPI above.  All other systems are negative.  Review of Systems  Constitutional: Negative for activity change and fever.  Respiratory: Negative.   Cardiovascular: Negative.   Genitourinary: Positive for penile pain.    Vital Signs: BP 104/62   Pulse 64   Resp 14   SpO2 98%   Physical Exam Vitals signs and  nursing note reviewed.  Constitutional:      Appearance: Normal appearance.  Cardiovascular:     Rate and Rhythm: Normal rate and regular rhythm.     Pulses: Normal pulses.     Heart sounds: Normal heart sounds.  Neurological:     Mental Status: He is alert.  Psychiatric:        Mood and Affect: Mood normal.        Behavior: Behavior normal.     Imaging: Ct Head Wo Contrast  Result Date: 10/14/2018 CLINICAL DATA:  Speech difficulty and vertigo EXAM: CT HEAD WITHOUT CONTRAST TECHNIQUE: Contiguous axial images were obtained  from the base of the skull through the vertex without intravenous contrast. COMPARISON:  None. FINDINGS: Brain: No acute hemorrhage. No extra-axial collection or mass effect. There is mild generalized volume loss. There are bifrontal approach deep brain stimulator leads with tips in the deep gray nuclei. There is an old left cerebellar infarct. Vascular: No abnormal hyperdensity of the major intracranial arteries or dural venous sinuses. No intracranial atherosclerosis. Skull: Bifrontal burr holes. Sinuses/Orbits: No fluid levels or advanced mucosal thickening of the visualized paranasal sinuses. No mastoid or middle ear effusion. The orbits are normal. IMPRESSION: 1. No acute intracranial abnormality. 2. Unchanged appearance of bifrontal approach deep brain stimulator leads. Electronically Signed   By: Ulyses Jarred M.D.   On: 10/14/2018 06:10   [place] Intraoperative  Result Date: 10/26/2018 CLINICAL DATA:  Ultrasound was provided for use by the ordering physician, and a technical charge was applied by the performing facility.  No radiologist interpretation/professional services rendered.    Labs:  CBC: Recent Labs    09/20/18 0810 09/21/18 0328 10/13/18 2103 10/14/18 0527  WBC 9.6 8.2 8.1 7.9  HGB 8.9* 8.3* 10.1* 9.9*  HCT 26.2* 25.2* 29.5* 29.8*  PLT 121* 144* 136* 121*    COAGS: Recent Labs    09/15/18 1429  INR 0.99  APTT 32    BMP: Recent Labs     09/19/18 0437 10/13/18 2103 10/14/18 0527 10/15/18 0324  NA 131* 125* 129* 130*  K 4.2 4.2 4.0 4.5  CL 103 95* 100 102  CO2 24 22 25 24   GLUCOSE 165* 137* 103* 99  BUN 20 21 17 21   CALCIUM 8.3* 9.2 9.1 9.0  CREATININE 1.17 1.04 0.99 1.18  GFRNONAA >60 >60 >60 60*  GFRAA >60 >60 >60 >60    LIVER FUNCTION TESTS: Recent Labs    03/24/18 2335 09/15/18 1429  BILITOT 0.6 0.6  AST 23 19  ALT 16 13  ALKPHOS 72 74  PROT 7.2 7.5  ALBUMIN 3.3* 3.9    TUMOR MARKERS: No results for input(s): AFPTM, CEA, CA199, CHROMGRNA in the last 8760 hours.  Assessment and Plan:  Mike Holloway is a 78 y.o. 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  As such, request made for placement of an image guided suprapubic catheter for urinary diversion purposes.  Patient is currently without complaint.    Risks and benefits image guided suprapubic catheter placement was discussed with the patient including bleeding, infection, damage to adjacent structures, bowel perforation/fistula connection, and sepsis.  All of the patient's questions were answered, patient is agreeable to proceed.  Consent signed and in chart.  Thank you for this interesting consult.  I greatly enjoyed meeting Nolberto Cheuvront and look forward to participating in their care.  A copy of this report was sent to the requesting provider on this date.  Electronically Signed: Sandi Mariscal, MD 10/26/2018, 4:28 PM   I spent a total of 15 Minutes in face to face in clinical consultation, greater than 50% of which was counseling/coordinating care for image guided suprapubic catheter placement.

## 2018-10-26 NOTE — Progress Notes (Signed)
Wife to come with patient today for CT guided supra pubic catheter insertion.  No further blood work needed per Dr. [name].

## 2018-10-26 NOTE — Procedures (Signed)
Pre procedural Dx: Urinary retention, urethral erosion Post procedural Dx: Same  Technically successful CT guided placed of a 14 Fr drainage catheter placement into the urinary bladder yielding clear urine.    EBL: None  Complications: None immediate  [name], MD Pager #: [phone]

## 2018-10-27 ENCOUNTER — Encounter: Payer: Self-pay | Admitting: Urology

## 2018-10-27 DIAGNOSIS — N368 Other specified disorders of urethra: Secondary | ICD-10-CM | POA: Insufficient documentation

## 2018-10-27 DIAGNOSIS — T8389XA Other specified complication of genitourinary prosthetic devices, implants and grafts, initial encounter: Secondary | ICD-10-CM

## 2018-10-30 ENCOUNTER — Other Ambulatory Visit: Payer: Self-pay | Admitting: Radiology

## 2018-10-30 DIAGNOSIS — T8389XD Other specified complication of genitourinary prosthetic devices, implants and grafts, subsequent encounter: Secondary | ICD-10-CM

## 2018-10-30 DIAGNOSIS — T8389XA Other specified complication of genitourinary prosthetic devices, implants and grafts, initial encounter: Secondary | ICD-10-CM

## 2018-10-30 DIAGNOSIS — R339 Retention of urine, unspecified: Secondary | ICD-10-CM

## 2018-10-30 DIAGNOSIS — N368 Other specified disorders of urethra: Secondary | ICD-10-CM

## 2018-10-31 ENCOUNTER — Ambulatory Visit: Admit: 2018-10-31 | Payer: Medicare Other | Admitting: Ophthalmology

## 2018-10-31 SURGERY — BLEPHAROPLASTY
Anesthesia: Monitor Anesthesia Care | Laterality: Bilateral

## 2018-11-13 ENCOUNTER — Other Ambulatory Visit: Payer: Self-pay

## 2018-11-13 ENCOUNTER — Emergency Department
Admission: EM | Admit: 2018-11-13 | Discharge: 2018-11-13 | Disposition: A | Discharge status: Home / Self Care | Payer: Medicare Other | Attending: Emergency Medicine | Admitting: Emergency Medicine

## 2018-11-13 ENCOUNTER — Emergency Department: Payer: Medicare Other

## 2018-11-13 DIAGNOSIS — Z79899 Other long term (current) drug therapy: Secondary | ICD-10-CM | POA: Insufficient documentation

## 2018-11-13 DIAGNOSIS — I10 Essential (primary) hypertension: Secondary | ICD-10-CM | POA: Diagnosis not present

## 2018-11-13 DIAGNOSIS — Z8572 Personal history of non-Hodgkin lymphomas: Secondary | ICD-10-CM | POA: Diagnosis not present

## 2018-11-13 DIAGNOSIS — Z960 Presence of urogenital implants: Secondary | ICD-10-CM | POA: Insufficient documentation

## 2018-11-13 DIAGNOSIS — R310 Gross hematuria: Secondary | ICD-10-CM | POA: Diagnosis not present

## 2018-11-13 DIAGNOSIS — Z880 Allergy status to penicillin: Secondary | ICD-10-CM | POA: Insufficient documentation

## 2018-11-13 DIAGNOSIS — E119 Type 2 diabetes mellitus without complications: Secondary | ICD-10-CM | POA: Diagnosis not present

## 2018-11-13 DIAGNOSIS — R1030 Lower abdominal pain, unspecified: Secondary | ICD-10-CM | POA: Insufficient documentation

## 2018-11-13 DIAGNOSIS — Z7982 Long term (current) use of aspirin: Secondary | ICD-10-CM | POA: Diagnosis not present

## 2018-11-13 DIAGNOSIS — R319 Hematuria, unspecified: Secondary | ICD-10-CM | POA: Diagnosis present

## 2018-11-13 LAB — CBC WITH DIFFERENTIAL/PLATELET
Abs Immature Granulocytes: 0.05 10*3/uL (ref 0.00–0.07)
Basophils Absolute: 0 10*3/uL (ref 0.0–0.1)
Basophils Relative: 0 %
Eosinophils Absolute: 0.2 10*3/uL (ref 0.0–0.5)
Eosinophils Relative: 3 %
HCT: 34.7 % — ABNORMAL LOW (ref 39.0–52.0)
Hemoglobin: 11.6 g/dL — ABNORMAL LOW (ref 13.0–17.0)
Immature Granulocytes: 1 %
Lymphocytes Relative: 18 %
Lymphs Abs: 1.2 10*3/uL (ref 0.7–4.0)
MCH: 33 pg (ref 26.0–34.0)
MCHC: 33.4 g/dL (ref 30.0–36.0)
MCV: 98.9 fL (ref 80.0–100.0)
Monocytes Absolute: 0.8 10*3/uL (ref 0.1–1.0)
Monocytes Relative: 11 %
Neutro Abs: 4.5 10*3/uL (ref 1.7–7.7)
Neutrophils Relative %: 67 %
Platelets: 141 10*3/uL — ABNORMAL LOW (ref 150–400)
RBC: 3.51 MIL/uL — ABNORMAL LOW (ref 4.22–5.81)
RDW: 16.3 % — ABNORMAL HIGH (ref 11.5–15.5)
WBC: 6.9 10*3/uL (ref 4.0–10.5)
nRBC: 0 % (ref 0.0–0.2)

## 2018-11-13 LAB — URINALYSIS, COMPLETE (UACMP) WITH MICROSCOPIC
Bacteria, UA: NONE SEEN
RBC / HPF: >50 RBC/hpf — ABNORMAL HIGH (ref 0–5)
Specific Gravity, Urine: 1.012 (ref 1.005–1.030)
Squamous Epithelial / LPF: NONE SEEN (ref 0–5)
WBC, UA: NONE SEEN WBC/hpf (ref 0–5)

## 2018-11-13 LAB — BASIC METABOLIC PANEL
Anion gap: 7 (ref 5–15)
BUN: 17 mg/dL (ref 8–23)
CO2: 24 mmol/L (ref 22–32)
Calcium: 9.5 mg/dL (ref 8.9–10.3)
Chloride: 99 mmol/L (ref 98–111)
Creatinine, Ser: 1.01 mg/dL (ref 0.61–1.24)
GFR calc Af Amer: >60 mL/min (ref >60)
GFR calc non Af Amer: >60 mL/min (ref >60)
Glucose, Bld: 113 mg/dL — ABNORMAL HIGH (ref 70–99)
Potassium: 4.3 mmol/L (ref 3.5–5.1)
Sodium: 130 mmol/L — ABNORMAL LOW (ref 135–145)

## 2018-11-13 NOTE — ED Triage Notes (Addendum)
[facility]) with lower abd pain since yesterdayand blood in urine. Pt has a foley in place and urine is dark red upon arrival to ED. PT reports it has been draining properly. Large clot noted in the bottom of pts leg bag. No tenderness upon assessment of lower abd. No NVD. No fevers. No hx of prostate problems.

## 2018-11-13 NOTE — ED Provider Notes (Signed)
[facility] Emergency Department Provider Note  ____________________________________________  Time seen: Approximately 3:16 PM  I have reviewed the triage vital signs and the nursing notes.   HISTORY  Chief Complaint Hematuria   HPI Mike Holloway is a 78 y.o. male who presents for evaluation of hematuria.  Patient sustained a fall with hip fracture in December.  He developed urinary retention and failed 2 voiding trials.  He had a Foley catheter for several weeks.  3 weeks ago Dr. Bernardo Heater switch his Foley catheter for a suprapubic catheter.  Today he started having gross hematuria in the catheter.  Patient is not on blood thinners.  He is currently on Levaquin for UTI.  Is also complaining of pressure in the suprapubic region and bilateral flank pain worse on the right that he describes as dull mild pain.  No fever, chills, nausea, vomiting.  Past Medical History:  Diagnosis Date  . Arthritis   . Atrial flutter ([place])   . Diabetes mellitus ([place])   . Essential tremor   . Essential tremor    deep brain stimulator   . Hypercholesteremia   . Hypertension   . Incontinence   . 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[place])    grew on the testical  . Sleep apnea   . Stroke [facility])     Patient Active Problem List   Diagnosis Date Noted  . 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 Generalized weakness 10/14/2018  . Hip fracture ([place]) 09/15/2018  . 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 HLD (hyperlipidemia) 03/24/2018  . Contusion of right knee 02/14/2018  . Bradycardia 12/28/2017  . 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 Chronic pain of right knee 08/10/2016  . Right ankle pain 08/10/2016  . Venous insufficiency of both lower extremities 05/13/2016  . 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[place]) 12/11/2015  . Urinary retention 09/08/2015  . Lymphoma, 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[place]) 04/03/2015  . Breathlessness on  exertion 11/21/2014  . Breath shortness 11/21/2014  . Arthropathy 11/07/2014  . Atrial flutter, paroxysmal ([place]) 11/07/2014  . Type 2 diabetes mellitus ([place]) 11/07/2014  . Benign essential tremor 11/07/2014  . Benign essential HTN 11/07/2014  . Mixed incontinence 11/07/2014  . Hypercholesterolemia without hypertriglyceridemia 11/07/2014  . Apnea, sleep 11/07/2014  . Controlled type 2 diabetes mellitus without complication ([place]) 83/41/9622  . Pure hypercholesterolemia 11/07/2014  . Other abnormalities of gait and mobility 11/02/2011  . Abnormal gait 06/29/2011  . Discoordination 06/29/2011    Past Surgical History:  Procedure Laterality Date  . ABLATION    . APPENDECTOMY    . CATARACT EXTRACTION, BILATERAL    . COLONOSCOPY WITH PROPOFOL N/A 03/17/2018   Procedure: COLONOSCOPY WITH PROPOFOL;  Surgeon: Jonathon Bellows, MD;  Location: [facility] ENDOSCOPY;  Service: Gastroenterology;  Laterality: N/A;  . DEEP BRAIN STIMULATOR PLACEMENT    . 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 HEMORRHOID SURGERY    . HERNIA REPAIR    . HIP FRACTURE SURGERY    . INTRAMEDULLARY (IM) NAIL INTERTROCHANTERIC Right 09/16/2018   Procedure: INTRAMEDULLARY (IM) NAIL INTERTROCHANTRIC;  Surgeon: Hessie Knows, MD;  Location: [facility] ORS;  Service: Orthopedics;  Laterality: Right;  . NASAL SINUS SURGERY    . ORCHIECTOMY    . TONSILLECTOMY      Prior to Admission medications   Medication Sig Start Date End Date Taking? Authorizing Provider  acetaminophen (TYLENOL) 650 MG CR tablet Take 650 mg by mouth every 8 (eight) hours as needed for pain.   Yes [provider]  aspirin EC 325 MG tablet Take 1 tablet (325 mg total) by mouth daily. 09/21/18 09/21/19 Yes Bettey Costa, MD  cholecalciferol (VITAMIN D) 25 MCG (1000 UT) tablet Take 1,000 Units by mouth at bedtime.    Yes [provider]  diclofenac sodium (VOLTAREN) 1 % GEL Apply 2 g topically 2 (two) times daily as needed (pain).    Yes [provider]  famotidine (PEPCID) 20 MG tablet Take 20 mg by mouth 2 (two) times daily.  05/20/18  Yes [provider]  ferrous [id number] C-folic acid (TRINSICON / FOLTRIN) capsule Take 1 capsule by mouth 2 (two) times daily after a meal. 09/19/18  Yes Mody, Sital, MD  FLUoxetine (PROZAC) 10 MG capsule Take 10 mg by mouth daily.  09/09/18  Yes [provider]  guaiFENesin-dextromethorphan (ROBITUSSIN DM) 100-10 MG/5ML syrup Take 5 mLs by mouth every 4 (four) hours as needed for cough.   Yes [provider]  HYDROcodone-acetaminophen (NORCO/VICODIN) 5-325 MG tablet Take 1-2 tablets by mouth every 4 (four) hours as needed for moderate pain (pain score 4-6). 09/21/18  Yes Mody, Ulice Bold, MD  Melatonin 5 MG TABS Take 5 mg by mouth at bedtime.   Yes [provider]  mirabegron ER (MYRBETRIQ) 50 MG TB24 tablet Take 1 tablet (50 mg total) by mouth daily. 05/11/18  Yes Billey Co, MD  primidone (MYSOLINE) 50 MG tablet Take 50-100 mg by mouth 2 (two) times daily. Take 2 tablets (100mg ) by mouth every morning and 1 tablet (50mg ) by mouth every night at bedtime   Yes [provider]  Probiotic Product (VSL#3) CAPS Take 1 capsule by mouth 2 (two) times daily.    Yes [provider]  propranolol ER (INDERAL LA) 160 MG SR capsule Take 160 mg by mouth daily.    Yes [provider]  psyllium (HYDROCIL/METAMUCIL) 95 % PACK Take 1 packet by mouth daily.   Yes [provider]  tamsulosin (FLOMAX) 0.4 MG CAPS capsule Take 0.4 mg by mouth daily.   Yes [provider]    Allergies Clindamycin; Flagyl [metronidazole]; Ambien  [zolpidem]; Penicillins; and Sulfa antibiotics  Family History  Problem Relation Age of Onset  . Hematuria Father   . Prostate cancer Father   . Heart disease Mother   . Kidney disease  Neg Hx   . Bladder Cancer Neg Hx     Social History Social History   Tobacco Use  . Smoking status: Never Smoker  . Smokeless tobacco: Never Used  Substance Use Topics  . Alcohol use: No    Alcohol/week: 0.0 standard drinks  . Drug use: No    Review of Systems  Constitutional: Negative for fever. Eyes: Negative for visual changes. ENT: Negative for sore throat. Neck: No neck pain  Cardiovascular: Negative for chest pain. Respiratory: Negative for shortness of breath. Gastrointestinal: Negative for abdominal pain, vomiting or diarrhea. Genitourinary: Negative for dysuria. + hematuria, b/l flank pain Musculoskeletal: Negative for back pain. Skin: Negative for rash. Neurological: Negative for headaches, weakness or numbness. Psych: No SI or HI  ____________________________________________   PHYSICAL EXAM:  VITAL SIGNS: ED Triage Vitals  Enc Vitals Group     BP 11/13/18 1311 127/89     Pulse Rate 11/13/18 1311 82     Resp --      Temp 11/13/18 1311  98.8 F (37.1 C)     Temp Source 11/13/18 1311 Oral     SpO2 11/13/18 1311 95 %     Weight 11/13/18 1312 190 lb 0.6 oz (86.2 kg)     Height --      Head Circumference --      [facility] Flow --      Pain Score 11/13/18 1314 2     Pain Loc --      Pain Edu? --      Excl. in [place]? --     Constitutional: Alert and oriented. Well appearing and in no apparent distress. HEENT:      Head: Normocephalic and atraumatic.         Eyes: Conjunctivae are normal. Sclera is non-icteric.       Mouth/Throat: Mucous membranes are moist.       Neck: Supple with no signs of meningismus. Cardiovascular: Regular rate and rhythm. No murmurs, gallops, or rubs. 2+ symmetrical distal pulses are present in all extremities. No JVD. Respiratory: Normal respiratory effort. Lungs are clear to auscultation bilaterally. No wheezes, crackles, or rhonchi.  Gastrointestinal: Soft, mild suprapubic tenderness, and non distended with positive bowel sounds. No  rebound or guarding. Genitourinary: Gross hematuria on the bag and catheter Musculoskeletal: Nontender with normal range of motion in all extremities. No edema, cyanosis, or erythema of extremities. Neurologic: Normal speech and language. Face is symmetric. Moving all extremities. No gross focal neurologic deficits are appreciated. Skin: Skin is warm, dry and intact. No rash noted. Psychiatric: Mood and affect are normal. Speech and behavior are normal.  ____________________________________________   LABS (all labs ordered are listed, but only abnormal results are displayed)  Labs Reviewed  CBC WITH DIFFERENTIAL/PLATELET - Abnormal; Notable for the following components:      Result Value   RBC 3.51 (*)    Hemoglobin 11.6 (*)    HCT 34.7 (*)    RDW 16.3 (*)    Platelets 141 (*)    All other components within normal limits  BASIC METABOLIC PANEL - Abnormal; Notable for the following components:   Sodium 130 (*)    Glucose, Bld 113 (*)    All other components within normal limits  URINALYSIS, COMPLETE (UACMP) WITH MICROSCOPIC - Abnormal; Notable for the following components:   Color, Urine RED (*)    APPearance CLOUDY (*)    Glucose, UA   (*)    Value: TEST NOT REPORTED DUE TO COLOR INTERFERENCE OF URINE PIGMENT   Hgb urine dipstick   (*)    Value: TEST NOT REPORTED DUE TO COLOR INTERFERENCE OF URINE PIGMENT   Bilirubin Urine   (*)    Value: TEST NOT REPORTED DUE TO COLOR INTERFERENCE OF URINE PIGMENT   Ketones, ur   (*)    Value: TEST NOT REPORTED DUE TO COLOR INTERFERENCE OF URINE PIGMENT   Protein, ur   (*)    Value: TEST NOT REPORTED DUE TO COLOR INTERFERENCE OF URINE PIGMENT   Nitrite   (*)    Value: TEST NOT REPORTED DUE TO COLOR INTERFERENCE OF URINE PIGMENT   Leukocytes,Ua   (*)    Value: TEST NOT REPORTED DUE TO COLOR INTERFERENCE OF URINE PIGMENT   RBC / HPF >50 (*)    All other components within normal limits  URINE CULTURE    ____________________________________________  EKG  none  ____________________________________________  RADIOLOGY  I have personally reviewed the images performed during this visit and I agree with the Radiologist's  read.   Interpretation by Radiologist:  Ct Renal Stone Study  Result Date: 11/13/2018 CLINICAL DATA:  Onset lower abdominal pain and hematuria yesterday. EXAM: CT ABDOMEN AND PELVIS WITHOUT CONTRAST TECHNIQUE: Multidetector CT imaging of the abdomen and pelvis was performed following the standard protocol without IV contrast. COMPARISON:  None. FINDINGS: Lower chest: Mild dependent atelectasis is present in the lung bases. No pleural or pericardial effusion. Heart size is normal. Hepatobiliary: 3 small low attenuating lesions are seen scattered in the liver measuring up to 1.3 cm in diameter. Two of these are too small to characterize. The largest is definitely a cyst. The liver is otherwise unremarkable. The gallbladder and biliary tree appear normal. Pancreas: Unremarkable. No pancreatic ductal dilatation or surrounding inflammatory changes. Spleen: Normal in size without focal abnormality. Adrenals/Urinary Tract: The adrenal glands appear normal. Two left renal cysts are noted. No renal or ureteral stones. No hydronephrosis. Suprapubic catheter is in place in the urinary bladder. Stomach/Bowel: Stomach is within normal limits. The appendix has been removed. No evidence of bowel wall thickening, distention, or inflammatory changes. Prominent stool ball in the rectum noted. Vascular/Lymphatic: Aortic atherosclerosis. No enlarged abdominal or pelvic lymph nodes. Reproductive: Prostate is unremarkable. Other: Small fat containing umbilical hernia is noted. Musculoskeletal: No acute bony or joint abnormality. There is partial visualization of fracture fixation hardware in the right hip. IMPRESSION: Negative for urinary tract stone. No acute abnormality or finding to explain the patient's  symptoms. Suprapubic catheter in place in the urinary bladder. Atherosclerosis. Somewhat prominent stool ball in the rectosigmoid colon. Small fat containing umbilical hernia. Electronically Signed   By: Inge Rise M.D.   On: 11/13/2018 14:01     ____________________________________________   PROCEDURES  Procedure(s) performed: None Procedures Critical Care performed:  None ____________________________________________   INITIAL IMPRESSION / ASSESSMENT AND PLAN / ED COURSE  78 y.o. male with a 23 week old suprapubic catheter who presents for evaluation of gross hematuria.  Hemodynamically stable, not on blood thinners.  Bladder scan showing no retention.  I was able to flush several clots out of the catheter.  After that, clear urine started to flow through the catheter.  Patient was complaining of flank pain therefore he was sent for CT abdomen pelvis to rule out kidney stones and that was negative.  Urinalysis here showing no bacteria but limited otherwise due to blood.  Culture is pending.  Patient is currently on Levaquin.  Patient remains hemodynamically stable with stable hemoglobin. Patient will be discharged back to his rehab facility.  Discussed Foley care and close follow-up with urologist.  Discussed signs and symptoms of acute blood loss anemia, sepsis, pyelo with patient and his wife and recommended return to the emergency room if these develop.      As part of my medical decision making, I reviewed the following data within the [place] notes reviewed and incorporated, Labs reviewed , Old chart reviewed, Radiograph reviewed , Notes from prior ED visits and [place] Controlled Substance Database    Pertinent labs & imaging results that were available during my care of the patient were reviewed by me and considered in my medical decision making (see chart for details).    ____________________________________________   FINAL CLINICAL IMPRESSION(S) /  ED DIAGNOSES  Final diagnoses:  Gross hematuria      NEW MEDICATIONS STARTED DURING THIS VISIT:  ED Discharge Orders    None       Note:  This document was prepared using  [employer] training and education officer and may include unintentional dictation errors.    Alfred Levins, [place], MD 11/13/18 [phone]

## 2018-11-13 NOTE — ED Notes (Signed)
Report to brittany at [facility], patient to go with wife by private car,

## 2018-11-13 NOTE — ED Notes (Signed)
Patient and family updated on treatment plan.

## 2018-11-13 NOTE — ED Notes (Signed)
Patient transported to CT 

## 2018-11-14 LAB — URINE CULTURE: Culture: NO GROWTH

## 2018-11-21 ENCOUNTER — Other Ambulatory Visit: Payer: Self-pay | Admitting: Radiology

## 2018-11-22 ENCOUNTER — Encounter: Payer: Self-pay | Admitting: Diagnostic Radiology

## 2018-11-22 ENCOUNTER — Ambulatory Visit
Admission: RE | Admit: 2018-11-22 | Discharge: 2018-11-22 | Disposition: A | Discharge status: Home / Self Care | Payer: Medicare Other | Source: Ambulatory Visit | Attending: Urology | Admitting: Urology

## 2018-11-22 DIAGNOSIS — T8389XD Other specified complication of genitourinary prosthetic devices, implants and grafts, subsequent encounter: Secondary | ICD-10-CM | POA: Diagnosis not present

## 2018-11-22 DIAGNOSIS — R339 Retention of urine, unspecified: Secondary | ICD-10-CM | POA: Insufficient documentation

## 2018-11-22 DIAGNOSIS — Z4803 Encounter for change or removal of drains: Secondary | ICD-10-CM | POA: Diagnosis present

## 2018-11-22 DIAGNOSIS — N368 Other specified disorders of urethra: Secondary | ICD-10-CM | POA: Diagnosis not present

## 2018-11-22 DIAGNOSIS — Y846 Urinary catheterization as the cause of abnormal reaction of the patient, or of later complication, without mention of misadventure at the time of the procedure: Secondary | ICD-10-CM | POA: Diagnosis not present

## 2018-11-22 HISTORY — PX: IR CATHETER TUBE CHANGE: IMG717

## 2018-11-22 LAB — GLUCOSE, CAPILLARY: Glucose-Capillary: 116 mg/dL — ABNORMAL HIGH (ref 70–99)

## 2018-11-22 MED ORDER — IOPAMIDOL (ISOVUE-300) INJECTION 61%
30.0000 mL | Freq: Once | INTRAVENOUS | Status: AC | PRN
  Administered 2018-11-22: 10 mL via INTRAVENOUS

## 2018-11-22 MED ORDER — LIDOCAINE HCL (PF) 1 % IJ SOLN
INTRAMUSCULAR | Status: AC
  Filled 2018-11-22: qty 30

## 2018-11-22 NOTE — Procedures (Signed)
Interventional Radiology Procedure:   Indications: Tube needs upsizing  Procedure: Exchange of suprapubic catheter  Findings: 14 Fr exchanged for 16 Fr pigtail drain  Complications: None     EBL: None  Plan: Drain is ready to be used.     Aleeya Veitch R. Anselm Pancoast, MD  Pager: [phone]

## 2018-11-22 NOTE — Discharge Instructions (Signed)
Suprapubic Catheter Home Guide  A suprapubic catheter is a rubber tube used to drain urine from the bladder into a collection bag. The catheter is inserted into the bladder through a small opening in the in the lower abdomen, near the center of the body, above the pubic bone (suprapubic area). There is a tiny balloon filled with germ-free (sterile) water on the end of the catheter that is in the bladder. The balloon helps to keep the catheter in place.  Your suprapubic catheter may need to be replaced every 4-6 weeks, or as often as recommended by your health care provider. The collection bag must be emptied every day and cleaned every 2-3 days. The collection bag can be put beside your bed at night and attached to your leg during the day. You may have a large collection bag to use at night and a smaller one to use during the day.  What are the risks?   Urine flow can become blocked. This can happen if the catheter is not working correctly, or if you have a blood clot in your bladder or in the catheter.   Tissue near the catheter may can become irritated and bleed.   Bacteria may get into your bladder and cause a urinary tract infection.  How do I change the catheter?  Supplies needed   Two pairs of sterile gloves.   Catheter.   Two syringes.   Sterile water.   Sterile cleaning solution.   Lubricant.   Collection bags.  Changing the catheter  To replace your catheter, take the following steps:  1. Drink plenty of fluids during the hours before you plan to change the catheter.  2. Wash your hands with soap and water. If soap and water are not available, use hand sanitizer.  3. Lie on your back and put on sterile gloves.  4. Clean the skin around the catheter opening using the sterile cleaning solution.  5. Remove the water from the balloon using a syringe.  6. Slowly remove the catheter.  ? Do not pull on the catheter if it seems stuck.  ? Call your health care provider immediately if you have difficulty  removing the catheter.  7. Take off the used gloves, and put on a new pair.  8. Put lubricant on the end of the new catheter that will go into your bladder.  9. Gently slide the catheter through the opening in your abdomen and into your bladder.  10. Wait for some urine to start flowing through the catheter. When urine starts to flow through the catheter, use a new syringe to fill the balloon with sterile water.  11. Attach the collection bag to the end of the catheter. Make sure the connection is tight.  12. Remove the gloves and wash your hands with soap and water.  How do I care for my skin around the catheter?  Use a clean washcloth and soapy water to clean the skin around your catheter every day. Pat the area dry with a clean towel.   Do not pull on the catheter.   Do not use ointment or lotion on this area unless told by your health care provider.   Check your skin around the catheter every day for signs of infection. Check for:  ? Redness, swelling, or pain.  ? Fluid or blood.  ? Warmth.  ? Pus or a bad smell.  How do I clean and empty the collection bag?  Clean the collection bag every 2-3   days, or as often as told by your health care provider. To do this, take the following steps:   Wash your hands with soap and water. If soap and water are not available, use hand sanitizer.   Disconnect the bag from the catheter and immediately attach a new bag to the catheter.   Empty the used bag completely.   Clean the used bag using one of the following methods:  ? Rinse the used bag with warm water and soap.  ? Fill the bag with water and add 1 tsp of vinegar. Let it sit for about 30 minutes, then empty the bag.   Let the bag dry completely, and put it in a clean plastic bag before storing it.  Empty the large collection bag every 8 hours. Empty the small collection bag when it is about ? full. To empty your large or small collection bag, take the following steps:   Always keep the bag below the level of the  catheter. This keeps urine from flowing backwards into the catheter.   Hold the bag over the toilet or another container. Turn the valve (spigot) at the bottom of the bag to empty the urine.  ? Do not touch the opening of the spigot.  ? Do not let the opening touch the toilet or container.   Close the spigot tightly when the bag is empty.  What are some general tips?     Always wash your hands before and after caring for your catheter and collection bag. Use a mild, fragrance-free soap. If soap and water are not available, use hand sanitizer.   Clean the catheter with soap and water as often as told by your health care provider.   Always make sure there are no twists or curls (kinks) in the catheter tube.   Always make sure there are no leaks in the catheter or collection bag.   Drink enough fluid to keep your urine clear or pale yellow.   Do not take baths, swim, or use a hot tub.  When should I seek medical care?  Seek medical care if:   You leak urine.   You have redness, swelling, or pain around your catheter opening.   You have fluid or blood coming from your catheter opening.   Your catheter opening feels warm to the touch.   You have pus or a bad smell coming from your catheter opening.   You have a fever or chills.   Your urine flow slows down.   Your urine becomes cloudy or smelly.  When should I seek immediate medical care?  Seek immediate medical care if your catheter comes out, or if you have:   Nausea.   Back pain.   Difficulty changing your catheter.   Blood in your urine.   No urine flow for 1 hour.  This information is not intended to replace advice given to you by your health care provider. Make sure you discuss any questions you have with your health care provider.  Document Released: 06/01/2011 Document Revised: 05/12/2016 Document Reviewed: 05/27/2015  Elsevier Interactive Patient Education  2019 [employer] Inc[employer]

## 2018-12-01 ENCOUNTER — Telehealth: Payer: Self-pay | Admitting: Urology

## 2018-12-01 NOTE — Telephone Encounter (Signed)
Called pt's wife informed her to place gauze at [facility] site for comfort as well as use medical tape or band aid to keep tube in place as she states that they tube is not staying in stat-lock. Advised pt that our supplies in this office are different from the supplies in IR and therefore I do not have supplies to give pt. Pt's wife gave verbal understanding.

## 2018-12-01 NOTE — Telephone Encounter (Signed)
Pt's wife has called back again about someone to give her advise over the phone about a problem with the clip on the cath. She LMOM

## 2018-12-01 NOTE — Telephone Encounter (Signed)
Pt's wife requests a call back ASAP about a question with the "clip " on the cath.

## 2018-12-20 ENCOUNTER — Encounter: Payer: Self-pay | Admitting: Urology

## 2018-12-20 ENCOUNTER — Ambulatory Visit (INDEPENDENT_AMBULATORY_CARE_PROVIDER_SITE_OTHER): Payer: Medicare Other | Admitting: Urology

## 2018-12-20 ENCOUNTER — Other Ambulatory Visit: Payer: Self-pay

## 2018-12-20 VITALS — BP 120/74 | HR 78

## 2018-12-20 DIAGNOSIS — N368 Other specified disorders of urethra: Secondary | ICD-10-CM

## 2018-12-20 DIAGNOSIS — R339 Retention of urine, unspecified: Secondary | ICD-10-CM

## 2018-12-20 NOTE — Progress Notes (Signed)
Suprapubic Cath Change  Patient is present today for a suprapubic catheter change due to urinary retention.   He has a 16 French pigtail catheter placed by interventional radiology approximately 1 month ago and presents for his first SP tube change. Site was cleaned and prepped in a sterile fashion with betadine.  The anchoring suture was cut and removed.  The pigtail catheter was cut to release the locking loop and removed without difficulty.    A 16 FR foley cath was unable to be placed in the tract due to a slightly larger outer diameter.   A 0.038 guidewire was placed through the SP tract and was negotiated into the bladder.  A 16 [place] council catheter was placed over the wire with mild difficulty secondary to resistance.  Urine return was noted, 10 ml of sterile water was inflated into the balloon and a leg bag was attached for drainage.  The catheter was irrigated without difficulty.  Patient tolerated well.     Performed by: John Giovanni, MD  Follow up: Follow-up 1 month for SP tube change.  His wife was given a catheter plug and will place approximately 4 hours prior to his appointment and we will see if the patient is able to void prior to tube change.   John Giovanni, MD

## 2018-12-20 NOTE — Patient Instructions (Signed)
Suprapubic Catheter Home Guide A suprapubic catheter is a rubber tube used to drain urine from the bladder into a collection bag. The catheter is inserted into the bladder through a small opening in the in the lower abdomen, near the center of the body, above the pubic bone (suprapubic area). There is a tiny balloon filled with germ-free (sterile) water on the end of the catheter that is in the bladder. The balloon helps to keep the catheter in place. Your suprapubic catheter may need to be replaced every 4-6 weeks, or as often as recommended by your health care provider. The collection bag must be emptied every day and cleaned every 2-3 days. The collection bag can be put beside your bed at night and attached to your leg during the day. You may have a large collection bag to use at night and a smaller one to use during the day. What are the risks?  Urine flow can become blocked. This can happen if the catheter is not working correctly, or if you have a blood clot in your bladder or in the catheter.  Tissue near the catheter may can become irritated and bleed.  Bacteria may get into your bladder and cause a urinary tract infection. How do I change the catheter? Supplies needed  Two pairs of sterile gloves.  Catheter.  Two syringes.  Sterile water.  Sterile cleaning solution.  Lubricant.  Collection bags. Changing the catheter To replace your catheter, take the following steps: 1. Drink plenty of fluids during the hours before you plan to change the catheter. 2. Wash your hands with soap and water. If soap and water are not available, use hand sanitizer. 3. Lie on your back and put on sterile gloves. 4. Clean the skin around the catheter opening using the sterile cleaning solution. 5. Remove the water from the balloon using a syringe. 6. Slowly remove the catheter. ? Do not pull on the catheter if it seems stuck. ? Call your health care provider immediately if you have difficulty  removing the catheter. 7. Take off the used gloves, and put on a new pair. 8. Put lubricant on the end of the new catheter that will go into your bladder. 9. Gently slide the catheter through the opening in your abdomen and into your bladder. 10. Wait for some urine to start flowing through the catheter. When urine starts to flow through the catheter, use a new syringe to fill the balloon with sterile water. 11. Attach the collection bag to the end of the catheter. Make sure the connection is tight. 12. Remove the gloves and wash your hands with soap and water. How do I care for my skin around the catheter? Use a clean washcloth and soapy water to clean the skin around your catheter every day. Pat the area dry with a clean towel.  Do not pull on the catheter.  Do not use ointment or lotion on this area unless told by your health care provider.  Check your skin around the catheter every day for signs of infection. Check for: ? Redness, swelling, or pain. ? Fluid or blood. ? Warmth. ? Pus or a bad smell. How do I clean and empty the collection bag? Clean the collection bag every 2-3 days, or as often as told by your health care provider. To do this, take the following steps:  Wash your hands with soap and water. If soap and water are not available, use hand sanitizer.  Disconnect the bag from the  catheter and immediately attach a new bag to the catheter.  Empty the used bag completely.  Clean the used bag using one of the following methods: ? Rinse the used bag with warm water and soap. ? Fill the bag with water and add 1 tsp of vinegar. Let it sit for about 30 minutes, then empty the bag.  Let the bag dry completely, and put it in a clean plastic bag before storing it. Empty the large collection bag every 8 hours. Empty the small collection bag when it is about ? full. To empty your large or small collection bag, take the following steps:  Always keep the bag below the level of the  catheter. This keeps urine from flowing backwards into the catheter.  Hold the bag over the toilet or another container. Turn the valve (spigot) at the bottom of the bag to empty the urine. ? Do not touch the opening of the spigot. ? Do not let the opening touch the toilet or container.  Close the spigot tightly when the bag is empty. What are some general tips?   Always wash your hands before and after caring for your catheter and collection bag. Use a mild, fragrance-free soap. If soap and water are not available, use hand sanitizer.  Clean the catheter with soap and water as often as told by your health care provider.  Always make sure there are no twists or curls (kinks) in the catheter tube.  Always make sure there are no leaks in the catheter or collection bag.  Drink enough fluid to keep your urine clear or pale yellow.  Do not take baths, swim, or use a hot tub. When should I seek medical care? Seek medical care if:  You leak urine.  You have redness, swelling, or pain around your catheter opening.  You have fluid or blood coming from your catheter opening.  Your catheter opening feels warm to the touch.  You have pus or a bad smell coming from your catheter opening.  You have a fever or chills.  Your urine flow slows down.  Your urine becomes cloudy or smelly. When should I seek immediate medical care? Seek immediate medical care if your catheter comes out, or if you have:  Nausea.  Back pain.  Difficulty changing your catheter.  Blood in your urine.  No urine flow for 1 hour. This information is not intended to replace advice given to you by your health care provider. Make sure you discuss any questions you have with your health care provider. Document Released: 06/01/2011 Document Revised: 05/12/2016 Document Reviewed: 05/27/2015 Elsevier Interactive Patient Education  2019 [employer] and Patient Education  Perform  catheter care every day.  You can do this while in the shower, but NOT while taking a tub bath.  You will need the following supplies: -mild soap, such as Dove -water -a clean washcloth (not one already used for bathing) or a 4x4 piece of gauze -1 Cath-Secure -night drainage bag -2 alcohol swabs  1. Was you hands thoroughly with soap and water 2. Using mild soap and water, clan your genital area a. Men should retract the foreskin, if needed, and clean the area, including the penis b. Women should separate the labia, and clean the area from front to back  3. Clean your urinary opening, which is where the catheter enters your body. 4. Clean the catheter from where it enters your body and then down, away from  your body.  Hold the catheter at the point it enters your body so that you don't put tension on it. 5. Rinse the area well and dry it gently.  Changing the drainage bag You will change your drainage bag twice a day -in the morning after you shower, change he night bag to the leg bag -at night before you go to bed, change the leg bag to the night bag  1. Wash your hands thoroughly with soap and water 2. Empty the urine from the drainage bag into the toilet before you change it 3. Pinch off the catheter with your fingers and disconnect the used bag 4. Wipe the end of the catheter using an alcohol pad 5. Wipe the connector on the bag using the second alcohol pad 6. Connect the clean bag to the catheter and release your finger pinch 7. Check all connections.  Straighten any kinks or twits in the tubing  Caring for the Leg bag -always wear the leg bag below your knee.  This will help it drain. -keep the leg bag secure with the velcro straps.  If the straps leave a mark on your leg they are to tight and should be loosened.  Leaving the straps too tight can decrease you circulation and lead to blood clots. -empty the leg bag through the spout at the bottom every 2-4 hours as needed.  Do  not let the bag become completely full. -do not lie down for longer than 2 hours while you are wearing the leg bag.  Caring for the Night Bag -always keep the night bag below the level of your bladder . -to hang your night bag while you sleep, place a clean plastic bag inside of a wastebasket.  Hang the night bag on the inside of the wastebasket.  Cleaning the Drainage bag 1. Wash you hands thoroughly with soap and water. 2. Rinse the equipment with cool water.  Do not use hot water it can damage the plastic equipment. 3. Was the equipment with a mild liquid detergent (ivory) and rinse with cool water. 4. To decrease odor, fill the bag halfway with a mixture of 1 part vinegar and 3 parts water. Shake the bag and let it sit for 15 minutes. 5. Rinse the bag with cool water and hang it up to dry.  Preventing infection -keep the drainage bag below the level of your bladder and off the floor at all times. -keep the catheter secured to your thigh to prevent it from moving. -do not lie on or block the flow of urine in the tubing. -shower daily to keep the catheter clean. -clean your hands before and after touching the catheter or bag. -the spout of the drainage bag should never touch the side of the toilet or any emptying container.  Special Points -You may see some blood or urine around where the catheter enters your body, especially when walking or having a bowel movement.  This is normal, as long as there is urine draining into the drainage bag.  If you experience significant leakage around  catheter tube where it enters your urethra possibly associated with lower abdominal cramping you could be having a bladder spasm.  Please notify your doctor and we can prescribe you a medication to improve your symptoms. -drink 1-2 glasses of liquids every 2 hours while you're awake.  Call your doctor immediately if -your catheter comes out, do not try to replace it yourself -you have temperature of 101F  (38.8C) or  higher -you have decrease in the amount of urine you are making -you have foul-smelling urine -you have bright red blood or large blood clots in your urine -you have abdominal pain and no urine in your catheter bag

## 2018-12-24 DIAGNOSIS — E222 Syndrome of inappropriate secretion of antidiuretic hormone: Secondary | ICD-10-CM | POA: Insufficient documentation

## 2018-12-24 DIAGNOSIS — E871 Hypo-osmolality and hyponatremia: Secondary | ICD-10-CM | POA: Insufficient documentation

## 2018-12-25 ENCOUNTER — Telehealth: Payer: Self-pay | Admitting: Urology

## 2018-12-25 NOTE — Telephone Encounter (Signed)
Spoke to patient's wife and he is in need of the stat lock to hold the catheter in place on his leg. I put it up front for her to pick up.

## 2018-12-25 NOTE — Telephone Encounter (Signed)
Pt was in office last week to see Stoioff.  Pt's tube keeps coming off and has no way of staying in place.  She wants to know what she can do.

## 2018-12-28 ENCOUNTER — Telehealth: Payer: Self-pay | Admitting: Urology

## 2018-12-28 NOTE — Telephone Encounter (Signed)
Pt's wife called and says pt is having clots in bag and notices a pinkish color to urine in bag.  He did have PT today and she wants to know if maybe this is due to activity.  Please call Pat at [phone].  She is worried about pt.

## 2018-12-28 NOTE — Telephone Encounter (Signed)
Spoke with [name] and told her that this can happen with increased exercise and for him to increase water intake. If clots were to increase and cause urine not to flow into bag she is to call for possible irrigation. Patient's bag is flowing fine now

## 2019-01-23 ENCOUNTER — Other Ambulatory Visit: Payer: Self-pay

## 2019-01-23 ENCOUNTER — Encounter: Payer: Self-pay | Admitting: Urology

## 2019-01-23 ENCOUNTER — Telehealth: Payer: Self-pay | Admitting: Urology

## 2019-01-23 ENCOUNTER — Ambulatory Visit (INDEPENDENT_AMBULATORY_CARE_PROVIDER_SITE_OTHER): Payer: Medicare Other | Admitting: Urology

## 2019-01-23 VITALS — BP 114/67 | HR 66

## 2019-01-23 DIAGNOSIS — R339 Retention of urine, unspecified: Secondary | ICD-10-CM | POA: Diagnosis not present

## 2019-01-23 NOTE — Telephone Encounter (Signed)
Patient's wife called this morning and left a voice mail message regarding the patient's appointment in the office today.  She is requesting a call back from the clinical staff with advice.

## 2019-01-23 NOTE — Progress Notes (Signed)
01/23/2019 2:36 PM   Mike Holloway [birth year] [id number]  Referring provider: [name], MD [place], [place] [address]  Chief Complaint  Patient presents with  . Follow-up    HPI: 78 year old male with postoperative urinary retention after fall/hip fracture.  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  He is currently out of rehab and is able to stand.  His wife plugged his SP tube this morning.  She states he voided around 125 mL approximately 2 hours before coming to this appointment.  He presently has no sensation of bladder fullness.   PMH: Past Medical History:  Diagnosis Date  . Arthritis   . Atrial flutter ([place])   . Diabetes mellitus ([place])   . Essential tremor   . Essential tremor    deep brain stimulator   . Hypercholesteremia   . Hypertension   . Incontinence   . 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 Sleep apnea   . Stroke [facility])     Surgical History: Past Surgical History:  Procedure Laterality Date  . ABLATION    . APPENDECTOMY    . CATARACT EXTRACTION, BILATERAL    . COLONOSCOPY WITH PROPOFOL N/A 03/17/2018   Procedure: COLONOSCOPY WITH PROPOFOL;  Surgeon: Jonathon Bellows, MD;  Location: [facility] ENDOSCOPY;  Service: Gastroenterology;  Laterality: N/A;  . DEEP BRAIN STIMULATOR PLACEMENT    . 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 HEMORRHOID SURGERY    . HERNIA REPAIR    . HIP FRACTURE SURGERY    . INTRAMEDULLARY (IM) NAIL INTERTROCHANTERIC Right 09/16/2018   Procedure: INTRAMEDULLARY (IM) NAIL INTERTROCHANTRIC;  Surgeon: Hessie Knows, MD;  Location: [facility] ORS;  Service: Orthopedics;  Laterality: Right;  . IR CATHETER TUBE CHANGE  11/22/2018  . NASAL SINUS SURGERY    . ORCHIECTOMY    . TONSILLECTOMY      Home Medications:  Allergies as  of 01/23/2019      Reactions   Clindamycin Diarrhea   Contracted C. Diff x 2   Flagyl [metronidazole] Swelling   Swollen tongue, excessive shaking,     Ambien  [zolpidem]    Other reaction(s): Other (See Comments) Disoriented and moody Other reaction(s): Other (See Comments) Disoriented and moody   Penicillins Rash   Has patient had a PCN reaction causing immediate rash, facial/tongue/throat swelling, SOB or lightheadedness with hypotension: Unknown Has patient had a PCN reaction causing severe rash involving mucus membranes or skin necrosis: Unknown Has patient had a PCN reaction that required hospitalization: No Has patient had a PCN reaction occurring within the last 10 years: No If all of the above answers are "NO", then may proceed with Cephalosporin use.   Sulfa Antibiotics Rash      Medication List       Accurate as of January 23, 2019  2:36 PM. Always use your most recent med list.        acetaminophen 650 MG CR tablet Commonly known as:  TYLENOL Take 650 mg by mouth every 8 (eight) hours as needed for pain.   aspirin EC 325 MG tablet Take 1 tablet (325 mg total) by mouth daily.   cholecalciferol 25 MCG (1000 UT) tablet Commonly known as:  VITAMIN D Take 1,000 Units by mouth at bedtime.   diclofenac sodium 1 % Gel  Commonly known as:  VOLTAREN Apply 2 g topically 2 (two) times daily as needed (pain).   famotidine 20 MG tablet Commonly known as:  PEPCID Take 20 mg by mouth 2 (two) times daily.   ferrous [id number] C-folic acid capsule Commonly known as:  TRINSICON / FOLTRIN Take 1 capsule by mouth 2 (two) times daily after a meal.   FLUoxetine 10 MG capsule Commonly known as:  PROZAC Take 10 mg by mouth daily.   furosemide 20 MG tablet Commonly known as:  LASIX Take by mouth.   guaiFENesin-dextromethorphan 100-10 MG/5ML syrup Commonly known as:  ROBITUSSIN DM Take 5 mLs by mouth every 4 (four) hours as needed for cough.    HYDROcodone-acetaminophen 5-325 MG tablet Commonly known as:  NORCO/VICODIN Take 1-2 tablets by mouth every 4 (four) hours as needed for moderate pain (pain score 4-6).   Melatonin 5 MG Tabs Take 5 mg by mouth at bedtime.   mirabegron ER 50 MG Tb24 tablet Commonly known as:  MYRBETRIQ Take 1 tablet (50 mg total) by mouth daily.   nystatin cream Commonly known as:  MYCOSTATIN Apply topically.   primidone 50 MG tablet Commonly known as:  MYSOLINE Take 50-100 mg by mouth 2 (two) times daily. Take 2 tablets (100mg ) by mouth every morning and 1 tablet (50mg ) by mouth every night at bedtime   propranolol ER 160 MG SR capsule Commonly known as:  INDERAL LA Take 160 mg by mouth daily.   psyllium 95 % Pack Commonly known as:  HYDROCIL/METAMUCIL Take 1 packet by mouth daily.   tamsulosin 0.4 MG Caps capsule Commonly known as:  FLOMAX Take 0.4 mg by mouth daily.   traZODone 50 MG tablet Commonly known as:  DESYREL   UNABLE TO FIND APPLY FROM NECK DOWN DAILY   VSL#3 Caps Take 1 capsule by mouth 2 (two) times daily.       Allergies:  Allergies  Allergen Reactions  . Clindamycin Diarrhea    Contracted C. Diff x 2  . Flagyl [Metronidazole] Swelling    Swollen tongue, excessive shaking,    . Ambien  [Zolpidem]     Other reaction(s): Other (See Comments) Disoriented and moody Other reaction(s): Other (See Comments) Disoriented and moody  . Penicillins Rash    Has patient had a PCN reaction causing immediate rash, facial/tongue/throat swelling, SOB or lightheadedness with hypotension: Unknown Has patient had a PCN reaction causing severe rash involving mucus membranes or skin necrosis: Unknown Has patient had a PCN reaction that required hospitalization: No Has patient had a PCN reaction occurring within the last 10 years: No If all of the above answers are "NO", then may proceed with Cephalosporin use.  . Sulfa Antibiotics Rash    Family History: Family History   Problem Relation Age of Onset  . Hematuria Father   . Prostate cancer Father   . Heart disease Mother   . Kidney disease Neg Hx   . Bladder Cancer Neg Hx     Social History:  reports that he has never smoked. He has never used smokeless tobacco. He reports that he does not drink alcohol or use drugs.  ROS: UROLOGY Frequent Urination?: No Hard to postpone urination?: No Burning/pain with urination?: No Get up at night to urinate?: No Leakage of urine?: No Urine stream starts and stops?: No Trouble starting stream?: No Do you have to strain to urinate?: No Blood in urine?: Yes Urinary tract infection?: No Sexually transmitted disease?: No Injury to kidneys or bladder?:  No Painful intercourse?: No Weak stream?: No Erection problems?: No Penile pain?: Yes  Gastrointestinal Nausea?: No Vomiting?: No Indigestion/heartburn?: No Diarrhea?: No Constipation?: No  Constitutional Fever: No Night sweats?: No Weight loss?: No Fatigue?: No  Skin Skin rash/lesions?: No Itching?: No  Eyes Blurred vision?: No Double vision?: No  Ears/Nose/Throat Sore throat?: No Sinus problems?: Yes  Hematologic/Lymphatic Swollen glands?: No Easy bruising?: No  Cardiovascular Leg swelling?: Yes Chest pain?: No  Respiratory Cough?: No Shortness of breath?: No  Endocrine Excessive thirst?: No  Musculoskeletal Back pain?: No Joint pain?: No  Neurological Headaches?: No Dizziness?: No  Psychologic Depression?: No Anxiety?: No  Physical Exam: BP 114/67 (BP Location: Left Arm, Patient Position: Sitting, Cuff Size: Normal)   Pulse 66   Constitutional:  Alert and oriented, No acute distress. HEENT: [place] AT, moist mucus membranes.  Trachea midline, no masses. Respiratory: Normal respiratory effort, no increased work of breathing.   Assessment & Plan:   78 year old male with urinary retention.  He has voided once since his SP tube was plugged and residual approximately 2  hours after voiding was 260 mL.  I recommended leaving his SP tube plugged and his wife will keep a log of voided volumes and residuals.  They will come back Monday and if his residuals are consistently less than 300 mL and he is asymptomatic would recommend removing the SP tube.  If he develops symptomatic urinary retention over the weekend they were given a Foley bag and she was instructed to place the catheter back to gravity drainage.   Abbie Sons, [place] [address] [place], [place] [address] [phone]

## 2019-01-23 NOTE — Telephone Encounter (Signed)
Called pt's wife, who inquires about if it is ok to plug pt's cath while there is blood visible in the catheter bag. Gave pt verbal reassurance to plug cath, we will see pt in clinic in a few hours as scheduled.

## 2019-01-25 ENCOUNTER — Ambulatory Visit: Payer: Medicare Other | Admitting: Urology

## 2019-01-25 DIAGNOSIS — R600 Localized edema: Secondary | ICD-10-CM | POA: Insufficient documentation

## 2019-01-28 NOTE — Progress Notes (Signed)
01/29/2019 12:34 PM   Mike Holloway 02-15-1941 [id number]  Referring provider: Sofie Holloway, [place], [place] [address]  Chief Complaint  Patient presents with   Follow-up    HPI: Mike Holloway is a 78 year old Caucasian male with urinary retention managed with a SPT who presents today to review his residual logs for possible SPT removal with his wife, Mike Holloway.  His wife brings in the voiding log for my review.   He is voiding 100-300 cc spontaneously with PVR's of a few ounces - 100 cc.  She has noted a few incidences of soaking his depends when SPT is plugged.  She has also noted a few incidences of gross hematuria in the bag and in the depends.  The hematuria in the depends is a faded spot about the size of a quarter.   When the SPT is plugged at night, he has to get up to urinate.  Due to his ambulatory issues, Mike Holloway is responsible in helping him up out of his chair to the commode.  This takes about 45 minutes and is happening twice nightly.  Because of this, she is plugging the SPT at night.    He is not having any fevers, chills, nausea or vomiting.  He is not having any suprapubic or flank pain.  His wife states his UTI symptoms consist of foul smelling urine and mental confusion.    His UA today is nitrate positive with 6-10 WBC's, > 30 RBC's and moderate bacteria.    He had postoperative urinary retention after fall/hip fracture.  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   Procedure: FECAL TRANSPLANT;  Surgeon: Jonathon Bellows, MD;  Location: [facility] ENDOSCOPY;  Service: Gastroenterology;  Laterality: N/A;   HEMORRHOID SURGERY     HERNIA REPAIR     HIP FRACTURE SURGERY     INTRAMEDULLARY (IM) NAIL INTERTROCHANTERIC Right 09/16/2018   Procedure: INTRAMEDULLARY (IM) NAIL INTERTROCHANTRIC;  Surgeon: Hessie Knows, MD;  Location: [facility] ORS;  Service: Orthopedics;  Laterality: Right;   IR CATHETER TUBE CHANGE  11/22/2018   NASAL SINUS SURGERY     ORCHIECTOMY     TONSILLECTOMY      Home Medications:  Allergies as of 01/29/2019      Reactions   Clindamycin Diarrhea   Contracted C. Diff x 2   Flagyl [metronidazole] Swelling   Swollen tongue, excessive shaking,     Ambien  [zolpidem]    Other reaction(s): Other (See Comments) Disoriented and moody Other reaction(s): Other (See Comments) Disoriented and moody   Penicillins Rash  Has patient had a PCN reaction causing immediate rash, facial/tongue/throat swelling, SOB or lightheadedness with hypotension: Unknown Has patient had a PCN reaction causing severe rash involving mucus membranes or skin necrosis: Unknown Has patient had a PCN reaction that required hospitalization: No Has patient had a PCN reaction occurring within the last 10 years: No If all of the above answers are "NO", then may proceed with Cephalosporin use.   Sulfa Antibiotics Rash      Medication List       Accurate as of Jan 29, 2019 12:34 PM. Always use your most recent med list.        acetaminophen 650 MG CR tablet Commonly known as:  TYLENOL Take 650 mg by mouth every 8 (eight) hours as needed for pain.   aspirin EC 325 MG tablet Take 1  tablet (325 mg total) by mouth daily.   cholecalciferol 25 MCG (1000 UT) tablet Commonly known as:  VITAMIN D Take 1,000 Units by mouth at bedtime.   diclofenac sodium 1 % Gel Commonly known as:  VOLTAREN Apply 2 g topically 2 (two) times daily as needed (pain).   famotidine 20 MG tablet Commonly known as:  PEPCID Take 20 mg by mouth 2 (two) times daily.   ferrous [id number] C-folic acid capsule Commonly known as:  TRINSICON / FOLTRIN Take 1 capsule by mouth 2 (two) times daily after a meal.   FLUoxetine 10 MG capsule Commonly known as:  PROZAC Take 10 mg by mouth daily.   furosemide 20 MG tablet Commonly known as:  LASIX Take by mouth.   guaiFENesin-dextromethorphan 100-10 MG/5ML syrup Commonly known as:  ROBITUSSIN DM Take 5 mLs by mouth every 4 (four) hours as needed for cough.   HYDROcodone-acetaminophen 5-325 MG tablet Commonly known as:  NORCO/VICODIN Take 1-2 tablets by mouth every 4 (four) hours as needed for moderate pain (pain score 4-6).   Melatonin 5 MG Tabs Take 5 mg by mouth at bedtime.   mirabegron ER 50 MG Tb24 tablet Commonly known as:  MYRBETRIQ Take 1 tablet (50 mg total) by mouth daily.   nystatin cream Commonly known as:  MYCOSTATIN Apply topically.   primidone 50 MG tablet Commonly known as:  MYSOLINE Take 50-100 mg by mouth 2 (two) times daily. Take 2 tablets (100mg ) by mouth every morning and 1 tablet (50mg ) by mouth every night at bedtime   propranolol ER 160 MG SR capsule Commonly known as:  INDERAL LA Take 160 mg by mouth daily.   psyllium 95 % Pack Commonly known as:  HYDROCIL/METAMUCIL Take 1 packet by mouth daily.   tamsulosin 0.4 MG Caps capsule Commonly known as:  FLOMAX Take 0.4 mg by mouth daily.   traZODone 50 MG tablet Commonly known as:  DESYREL   UNABLE TO FIND APPLY FROM NECK DOWN DAILY   VSL#3 Caps Take 1 capsule by mouth 2 (two) times daily.       Allergies:  Allergies  Allergen Reactions    Clindamycin Diarrhea    Contracted C. Diff x 2   Flagyl [Metronidazole] Swelling    Swollen tongue, excessive shaking,     Ambien  [Zolpidem]     Other reaction(s): Other (See Comments) Disoriented and moody Other reaction(s): Other (See Comments) Disoriented and moody   Penicillins Rash    Has patient had a PCN reaction causing immediate rash, facial/tongue/throat swelling, SOB or lightheadedness with hypotension: Unknown Has patient had a PCN reaction causing severe rash involving mucus membranes or skin necrosis:  Unknown Has patient had a PCN reaction that required hospitalization: No Has patient had a PCN reaction occurring within the last 10 years: No If all of the above answers are "NO", then may proceed with Cephalosporin use.   Sulfa Antibiotics Rash    Family History: Family History  Problem Relation Age of Onset   Hematuria Father    Prostate cancer Father    Heart disease Mother    Kidney disease Neg Hx    Bladder Cancer Neg Hx     Social History:  reports that he has never smoked. He has never used smokeless tobacco. He reports that he does not drink alcohol or use drugs.  ROS: UROLOGY Frequent Urination?: No Hard to postpone urination?: Yes Burning/pain with urination?: No Get up at night to urinate?: No Leakage of urine?: No Urine stream starts and stops?: No Trouble starting stream?: No Do you have to strain to urinate?: No Blood in urine?: No Urinary tract infection?: No Sexually transmitted disease?: No Injury to kidneys or bladder?: No Painful intercourse?: No Weak stream?: No Erection problems?: No Penile pain?: No  Gastrointestinal Nausea?: No Vomiting?: No Indigestion/heartburn?: No Diarrhea?: No Constipation?: No  Constitutional Fever: No Night sweats?: No Weight loss?: No Fatigue?: No  Skin Skin rash/lesions?: No Itching?: No  Eyes Blurred vision?: No Double vision?: No  Ears/Nose/Throat Sore throat?: No Sinus  problems?: No  Hematologic/Lymphatic Swollen glands?: No Easy bruising?: No  Cardiovascular Leg swelling?: No Chest pain?: No  Respiratory Cough?: No Shortness of breath?: No  Endocrine Excessive thirst?: No  Musculoskeletal Back pain?: No Joint pain?: No  Neurological Headaches?: No Dizziness?: No  Psychologic Depression?: No Anxiety?: No  Physical Exam: BP 123/79    Pulse 68    Ht 5\' 3"  (1.6 m)    Wt 201 lb (91.2 kg)    BMI 35.61 kg/m   Constitutional:  Well nourished. Alert and oriented, No acute distress. HEENT: [place] AT, moist mucus membranes.  Trachea midline, no masses. Cardiovascular: No clubbing or cyanosis.  Peripheral edema is noted bilaterally.   Respiratory: Normal respiratory effort, no increased work of breathing. GI: Abdomen is soft, non tender, non distended, no abdominal masses. Liver and spleen not palpable.  No hernias appreciated.  Stool sample for occult testing is not indicated.   GU: No CVA tenderness.  No bladder fullness or masses.  Patient with uncircumcised phallus.  Foreskin easily retracted.   Urethral meatus is patent.  No penile discharge. Balanitis is present.   Skin: A large mole in the inguinal grove on the right.   Lymph: No inguinal adenopathy. Neurologic: Grossly intact, no focal deficits, moving all 4 extremities. Psychiatric: Normal mood and affect.  Suprapubic Cath Change Patient is present today for a suprapubic catheter change due to urinary retention.  Catheter plug was removed and SPT was drained of 200 cc of dark yellow urine.  A sample was collected for UA and culture.  5 ml of water was drained from the balloon, a 16 FR foley cath was removed from the tract with out difficulty.  Site was cleaned and prepped in a sterile fashion with betadine.  A 16 FR foley cath was replaced into the tract no complications were noted. Urine return was noted, 10 ml of sterile water was inflated into the balloon and a catheter plug was replaced.    Patient tolerated well. A night bag was given to patient.  I also instructed Mrs. Beaubien on how to irrigate the SPT if necessary.  I have given her a bottle of saline solution, tomey syringe and an extra over night bag.    Performed by: Zara Council, PA-C  Assessment & Plan:     1. Gross hematuria - UA looks suspicious for infection so will send for culture, will hold on prescribing an antibiotic until sensitivities have returned - Mike Holloway is to contact us if her husband should start having fevers/chills, nausea/vomiting or suprapubic/flank pain prior to sensitivity availability  2. Nocturia - Mike Holloway is exhausted and frustrated with her husband's night time voids, they do have an appointment with a sleep specialist in June and she feels he will become more ambulatory in the future, so we will retain the SPT at this time and evaluate when they return in June   3. History of urinary retention - appears to be voiding adequately when SPT is plugged, but will hold on removing tube until patient is more ambulatory and has seen the sleep specialist - follow up in one month for SPT exchange or removal in one month  4. Balanitis - instructed Pat to pull the foreskin back and clean the head of the penis with soapy water and then dry completely before applying the Nystatin cream BID - she does not need a script at this time  - will recheck in one month   Zara Council, [place] [address] [place], [place] [address] [phone]

## 2019-01-29 ENCOUNTER — Other Ambulatory Visit: Payer: Self-pay

## 2019-01-29 ENCOUNTER — Encounter: Payer: Self-pay | Admitting: Urology

## 2019-01-29 ENCOUNTER — Ambulatory Visit (INDEPENDENT_AMBULATORY_CARE_PROVIDER_SITE_OTHER): Payer: Medicare Other | Admitting: Urology

## 2019-01-29 VITALS — BP 123/79 | HR 68 | Ht 63.0 in | Wt 201.0 lb

## 2019-01-29 DIAGNOSIS — R310 Gross hematuria: Secondary | ICD-10-CM

## 2019-01-29 DIAGNOSIS — R339 Retention of urine, unspecified: Secondary | ICD-10-CM | POA: Diagnosis not present

## 2019-01-29 DIAGNOSIS — N481 Balanitis: Secondary | ICD-10-CM

## 2019-01-29 DIAGNOSIS — R351 Nocturia: Secondary | ICD-10-CM | POA: Diagnosis not present

## 2019-01-29 LAB — URINALYSIS, COMPLETE
Bilirubin, UA: NEGATIVE
Glucose, UA: NEGATIVE
Ketones, UA: NEGATIVE
Nitrite, UA: POSITIVE — AB
Specific Gravity, UA: 1.02 (ref 1.005–1.030)
Urobilinogen, Ur: 0.2 mg/dL (ref 0.2–1.0)
pH, UA: 5.5 (ref 5.0–7.5)

## 2019-01-29 LAB — MICROSCOPIC EXAMINATION
Epithelial Cells (non renal): NONE SEEN /hpf (ref 0–10)
RBC, Urine: >30 /hpf — AB (ref 0–2)

## 2019-01-30 ENCOUNTER — Telehealth: Payer: Self-pay

## 2019-01-30 NOTE — Telephone Encounter (Signed)
-----   Message from Nori Riis, PA-C sent at 01/30/2019 10:40 AM EDT ----- Would you let Mike Holloway know that Mr. Cominsky urine was positive for blood and had a lot of bacteria?  I do want to wait on the culture before prescribing an antibiotic at this time unless he has developed fevers or pain.

## 2019-01-30 NOTE — Telephone Encounter (Signed)
Patients wife notified

## 2019-02-05 ENCOUNTER — Telehealth: Payer: Self-pay

## 2019-02-05 DIAGNOSIS — N3001 Acute cystitis with hematuria: Secondary | ICD-10-CM

## 2019-02-05 LAB — CULTURE, URINE COMPREHENSIVE

## 2019-02-05 MED ORDER — NITROFURANTOIN MONOHYD MACRO 100 MG PO CAPS: 100.0000 mg | ORAL_CAPSULE | Freq: Two times a day (BID) | ORAL | 0 refills | Status: AC

## 2019-02-05 NOTE — Telephone Encounter (Signed)
Called pt informed him of the information below, pt gave verbal understanding. RX sent in. Advised pt to call back in 2 weeks to recheck urine.

## 2019-02-05 NOTE — Telephone Encounter (Signed)
-----   Message from Nori Riis, PA-C sent at 02/05/2019 12:40 PM EDT ----- Please let Mr. Martine and his wife know that his urine culture has resulted and it is positive for two bacteria.  There was a third organism that was growing out, but thankfully it was just normal urogenital flora.  That's the reason why the culture took longer to results.  I suggest we start Macrobid 100 mg, BID x seven days.  Then we can recheck the urine for blood.

## 2019-02-08 ENCOUNTER — Telehealth: Payer: Self-pay | Admitting: Urology

## 2019-02-08 NOTE — Telephone Encounter (Signed)
Patient's wife called the office about a possible medication reaction.  Patient started Macrobid on 02/05/19.  He now has a rash all over his legs.    Please call patient and advise.

## 2019-02-08 NOTE — Telephone Encounter (Signed)
Called pt's wife who states that since beginning [place] the patient has had a rash on the lower extremities. Rash stops at the sock line, rash is red, itchy, and in clusters. Pt has multiple antibiotic allergies. Spoke with doctor of the day (Dr.Sninsky) regarding this who advises that pt do timed benadryl and continue to monitor symptoms. Called pt's wife informed her of the information from the doctor. Wife gave verbal understanding.

## 2019-02-22 ENCOUNTER — Telehealth: Payer: Self-pay | Admitting: Urology

## 2019-02-22 NOTE — Telephone Encounter (Signed)
Pt's wife wants to know when he can come in for a follow up appt about urine culture. Please advise.

## 2019-02-22 NOTE — Telephone Encounter (Signed)
He has an appointment with me on 03/05/2019 for exam and SPT exchange.  I can just check his urine at that time.

## 2019-02-27 NOTE — Telephone Encounter (Signed)
Patient notified

## 2019-03-04 NOTE — Progress Notes (Signed)
03/05/2019 11:50 AM   Mike Holloway Mar 24, 1941 [id number]  Referring provider: [name], MD [place], [place] [address]  Chief Complaint  Patient presents with  . Urinary Retention    HPI: Mike Holloway is a 78 year old Caucasian male with urinary retention managed with a SPT who presents today to review his residual logs for possible SPT removal with his wife, Mike Holloway.  01/29/2019 -  His wife brings in the voiding log for my review.   He is voiding 100-300 cc spontaneously with PVR's of a few ounces - 100 cc.  She has noted a few incidences of soaking his depends when SPT is plugged.  She has also noted a few incidences of gross hematuria in the bag and in the depends.  The hematuria in the depends is a faded spot about the size of a quarter.  When the SPT is plugged at night, he has to get up to urinate.  Due to his ambulatory issues, Mike Holloway is responsible in helping him up out of his chair to the commode.  This takes about 45 minutes and is happening twice nightly.  Because of this, she is plugging the SPT at night.  He is not having any fevers, chills, nausea or vomiting.  He is not having any suprapubic or flank pain.  His wife states his UTI symptoms consist of foul smelling urine and mental confusion.  His UA today is nitrate positive with 6-10 WBC's, > 30 RBC's and moderate bacteria.  Urine culture was positive for staph simulans.  He had postoperative urinary retention after fall/hip fracture.  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  Today, Mike Holloway states that her husband's urgency has seemed to improved after he started the antibiotic.  His hematuria has resolved as well.   She states that when the SPT is capped during the day, his residuals are minimal.  She still has to uncap the SPT as he has nocturia.  Patient denies any gross hematuria, dysuria or suprapubic/flank pain.  Patient denies any fevers, chills, nausea  or vomiting.   His UA today is nitrite positive, > 30 WBC's, 3-10 RBC's and many bacteria.     PMH: Past Medical History:  Diagnosis Date  . Arthritis   . Atrial flutter ([place])   . Diabetes mellitus ([place])   . Essential tremor   . Essential tremor    deep brain stimulator   . Hypercholesteremia   . Hypertension   . Incontinence   . 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 Sleep apnea   . Stroke [facility])     Surgical History: Past Surgical History:  Procedure Laterality Date  . ABLATION    . APPENDECTOMY    . CATARACT EXTRACTION, BILATERAL    . COLONOSCOPY WITH PROPOFOL N/A 03/17/2018   Procedure: COLONOSCOPY WITH PROPOFOL;  Surgeon: Jonathon Bellows, MD;  Location: [facility] ENDOSCOPY;  Service: Gastroenterology;  Laterality: N/A;  . DEEP BRAIN STIMULATOR PLACEMENT    . 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 HEMORRHOID SURGERY    . HERNIA REPAIR    . HIP FRACTURE SURGERY    . INTRAMEDULLARY (IM) NAIL INTERTROCHANTERIC Right 09/16/2018   Procedure: INTRAMEDULLARY (IM) NAIL INTERTROCHANTRIC;  Surgeon: Hessie Knows, MD;  Location: [facility] ORS;  Service: Orthopedics;  Laterality:  Right;  . IR CATHETER TUBE CHANGE  11/22/2018  . NASAL SINUS SURGERY    . ORCHIECTOMY    . TONSILLECTOMY      Home Medications:  Allergies as of 03/05/2019      Reactions   Clindamycin Diarrhea   Contracted C. Diff x 2   Flagyl [metronidazole] Swelling   Swollen tongue, excessive shaking,     Ambien  [zolpidem]    Other reaction(s): Other (See Comments) Disoriented and moody Other reaction(s): Other (See Comments) Disoriented and moody   Penicillins Rash   Has patient had a PCN reaction causing immediate rash, facial/tongue/throat swelling, SOB or lightheadedness with hypotension: Unknown Has patient had a PCN reaction causing severe rash involving mucus membranes or skin  necrosis: Unknown Has patient had a PCN reaction that required hospitalization: No Has patient had a PCN reaction occurring within the last 10 years: No If all of the above answers are "NO", then may proceed with Cephalosporin use.   Sulfa Antibiotics Rash      Medication List       Accurate as of March 05, 2019 11:50 AM. If you have any questions, ask your nurse or doctor.        STOP taking these medications   ferrous [id number] C-folic acid capsule Commonly known as:  TRINSICON / FOLTRIN Stopped by:  Zara Council, PA-C   HYDROcodone-acetaminophen 5-325 MG tablet Commonly known as:  NORCO/VICODIN Stopped by:  Nysir Fergusson, PA-C   nystatin cream Commonly known as:  MYCOSTATIN Stopped by:  Jayvon Mounger, PA-C   traZODone 50 MG tablet Commonly known as:  DESYREL Stopped by:  Zara Council, PA-C     TAKE these medications   acetaminophen 650 MG CR tablet Commonly known as:  TYLENOL Take 650 mg by mouth every 8 (eight) hours as needed for pain.   aspirin EC 325 MG tablet Take 1 tablet (325 mg total) by mouth daily.   cholecalciferol 25 MCG (1000 UT) tablet Commonly known as:  VITAMIN D Take 1,000 Units by mouth at bedtime.   diclofenac sodium 1 % Gel Commonly known as:  VOLTAREN Apply 2 g topically 2 (two) times daily as needed (pain).   famotidine 20 MG tablet Commonly known as:  PEPCID Take 20 mg by mouth 2 (two) times daily.   FLUoxetine 10 MG capsule Commonly known as:  PROZAC Take 10 mg by mouth daily.   furosemide 20 MG tablet Commonly known as:  LASIX Take by mouth.   guaiFENesin-dextromethorphan 100-10 MG/5ML syrup Commonly known as:  ROBITUSSIN DM Take 5 mLs by mouth every 4 (four) hours as needed for cough.   Melatonin 5 MG Tabs Take 5 mg by mouth at bedtime.   mirabegron ER 50 MG Tb24 tablet Commonly known as:  MYRBETRIQ Take 1 tablet (50 mg total) by mouth daily.   primidone 50 MG tablet Commonly known as:  MYSOLINE  Take 50-100 mg by mouth 2 (two) times daily. Take 2 tablets (100mg ) by mouth every morning and 1 tablet (50mg ) by mouth every night at bedtime   propranolol ER 160 MG SR capsule Commonly known as:  INDERAL LA Take 160 mg by mouth daily.   psyllium 95 % Pack Commonly known as:  HYDROCIL/METAMUCIL Take 1 packet by mouth daily.   tamsulosin 0.4 MG Caps capsule Commonly known as:  FLOMAX Take 0.4 mg by mouth daily.   UNABLE TO FIND APPLY FROM NECK DOWN DAILY   VSL#3 Caps Take 1 capsule by mouth 2 (  two) times daily.       Allergies:  Allergies  Allergen Reactions  . Clindamycin Diarrhea    Contracted C. Diff x 2  . Flagyl [Metronidazole] Swelling    Swollen tongue, excessive shaking,    . Ambien  [Zolpidem]     Other reaction(s): Other (See Comments) Disoriented and moody Other reaction(s): Other (See Comments) Disoriented and moody  . Penicillins Rash    Has patient had a PCN reaction causing immediate rash, facial/tongue/throat swelling, SOB or lightheadedness with hypotension: Unknown Has patient had a PCN reaction causing severe rash involving mucus membranes or skin necrosis: Unknown Has patient had a PCN reaction that required hospitalization: No Has patient had a PCN reaction occurring within the last 10 years: No If all of the above answers are "NO", then may proceed with Cephalosporin use.  . Sulfa Antibiotics Rash    Family History: Family History  Problem Relation Age of Onset  . Hematuria Father   . Prostate cancer Father   . Heart disease Mother   . Kidney disease Neg Hx   . Bladder Cancer Neg Hx     Social History:  reports that he has never smoked. He has never used smokeless tobacco. He reports that he does not drink alcohol or use drugs.  ROS: UROLOGY Frequent Urination?: No Hard to postpone urination?: No Burning/pain with urination?: No Get up at night to urinate?: No Leakage of urine?: No Urine stream starts and stops?: No Trouble  starting stream?: No Do you have to strain to urinate?: No Blood in urine?: No Urinary tract infection?: No Sexually transmitted disease?: No Injury to kidneys or bladder?: No Painful intercourse?: No Weak stream?: No Erection problems?: No Penile pain?: No  Gastrointestinal Nausea?: No Vomiting?: No Indigestion/heartburn?: No Diarrhea?: No Constipation?: No  Constitutional Fever: No Night sweats?: No Weight loss?: No Fatigue?: No  Skin Skin rash/lesions?: No Itching?: Yes  Eyes Blurred vision?: No Double vision?: No  Ears/Nose/Throat Sore throat?: No Sinus problems?: Yes  Hematologic/Lymphatic Swollen glands?: No Easy bruising?: No  Cardiovascular Leg swelling?: Yes Chest pain?: No  Respiratory Cough?: No Shortness of breath?: No  Endocrine Excessive thirst?: No  Musculoskeletal Back pain?: No Joint pain?: Yes  Neurological Headaches?: No Dizziness?: No  Psychologic Depression?: No Anxiety?: No  Physical Exam: BP 116/72 (BP Location: Left Arm, Patient Position: Sitting, Cuff Size: Normal)   Pulse 69   Ht 5\' 3"  (1.6 m)   BMI 35.61 kg/m   Constitutional:  Well nourished. Alert and oriented, No acute distress. HEENT: [place] AT, moist mucus membranes.  Trachea midline, no masses. Cardiovascular: No clubbing, cyanosis, or edema. Respiratory: Normal respiratory effort, no increased work of breathing. Neurologic: Grossly intact, no focal deficits, moving all 4 extremities. Psychiatric: Normal mood and affect.  Assessment & Plan:     1. History of gross hematuria UA still with microscopic hematuria and nitrite positive - will send for culture and will hold on prescribing antibiotic until cultures are available Mike Holloway is to contact us if her husband should start having fevers/chills, nausea/vomiting or suprapubic/flank pain prior to sensitivity availability  2. Nocturia Explained to Mike Holloway that the nocturia is often multi-factorial and still may  persist even though urological issues have been treated They have an appointment with [facility] sleep specialist on 03/15/2019  3. History of urinary retention Appears to be voiding adequately when SPT is plugged, but will hold on removing tube until patient is more ambulatory and has seen the sleep specialist SPT exchange  today  Follow up in one month for SPT exchange or removal in one month  4. Balanitis Pat states it has resolved   Zara Council, PA-C  [place] [address] [place], [place] [address] [phone]

## 2019-03-05 ENCOUNTER — Ambulatory Visit (INDEPENDENT_AMBULATORY_CARE_PROVIDER_SITE_OTHER): Payer: Medicare Other | Admitting: Urology

## 2019-03-05 ENCOUNTER — Encounter: Payer: Self-pay | Admitting: Urology

## 2019-03-05 ENCOUNTER — Other Ambulatory Visit: Payer: Self-pay

## 2019-03-05 ENCOUNTER — Ambulatory Visit: Payer: Medicare Other | Admitting: Urology

## 2019-03-05 VITALS — BP 116/72 | HR 69 | Ht 63.0 in

## 2019-03-05 DIAGNOSIS — R351 Nocturia: Secondary | ICD-10-CM | POA: Diagnosis not present

## 2019-03-05 DIAGNOSIS — R339 Retention of urine, unspecified: Secondary | ICD-10-CM

## 2019-03-05 DIAGNOSIS — R310 Gross hematuria: Secondary | ICD-10-CM | POA: Diagnosis not present

## 2019-03-05 DIAGNOSIS — N481 Balanitis: Secondary | ICD-10-CM

## 2019-03-05 LAB — MICROSCOPIC EXAMINATION: WBC, UA: >30 /hpf — AB (ref 0–5)

## 2019-03-05 LAB — URINALYSIS, COMPLETE
Bilirubin, UA: NEGATIVE
Glucose, UA: NEGATIVE
Ketones, UA: NEGATIVE
Nitrite, UA: POSITIVE — AB
Protein,UA: NEGATIVE
Specific Gravity, UA: 1.02 (ref 1.005–1.030)
Urobilinogen, Ur: 0.2 mg/dL (ref 0.2–1.0)
pH, UA: 5 (ref 5.0–7.5)

## 2019-03-05 NOTE — Progress Notes (Signed)
Suprapubic Cath Change  Patient is present today for a suprapubic catheter change due to urinary retention.  68ml of water was drained from the balloon, a 16FR foley cath was removed from the tract with out difficulty.  Site was cleaned and prepped in a sterile fashion with betadine.  A 16FR foley cath was replaced into the tract no complications were noted. Urine return was noted, 10 ml of sterile water was inflated into the balloon and a plug was attached.  Patient tolerated well. A night bag was given to patient.    Preformed by: Elberta Leatherwood, CMA  Follow up: 4 weeks

## 2019-03-07 ENCOUNTER — Telehealth: Payer: Self-pay

## 2019-03-07 LAB — CULTURE, URINE COMPREHENSIVE

## 2019-03-07 NOTE — Telephone Encounter (Signed)
Patients wife notified

## 2019-03-07 NOTE — Telephone Encounter (Signed)
-----   Message from Nori Riis, PA-C sent at 03/07/2019 11:39 AM EDT ----- Please let [name] know that her husband's urine culture was negative for infection.

## 2019-03-29 ENCOUNTER — Ambulatory Visit (INDEPENDENT_AMBULATORY_CARE_PROVIDER_SITE_OTHER): Payer: Medicare Other | Admitting: Vascular Surgery

## 2019-03-29 ENCOUNTER — Encounter (INDEPENDENT_AMBULATORY_CARE_PROVIDER_SITE_OTHER): Payer: Self-pay | Admitting: Vascular Surgery

## 2019-03-29 ENCOUNTER — Other Ambulatory Visit: Payer: Self-pay

## 2019-03-29 VITALS — BP 127/77 | HR 71 | Resp 17 | Ht 68.0 in | Wt 201.0 lb

## 2019-03-29 DIAGNOSIS — I872 Venous insufficiency (chronic) (peripheral): Secondary | ICD-10-CM | POA: Diagnosis not present

## 2019-03-29 DIAGNOSIS — I890 Lymphedema, not elsewhere classified: Secondary | ICD-10-CM | POA: Diagnosis not present

## 2019-03-29 DIAGNOSIS — E119 Type 2 diabetes mellitus without complications: Secondary | ICD-10-CM

## 2019-03-29 DIAGNOSIS — E7800 Pure hypercholesterolemia, unspecified: Secondary | ICD-10-CM

## 2019-03-29 DIAGNOSIS — I10 Essential (primary) hypertension: Secondary | ICD-10-CM

## 2019-03-29 DIAGNOSIS — Z794 Long term (current) use of insulin: Secondary | ICD-10-CM

## 2019-03-29 DIAGNOSIS — I4892 Unspecified atrial flutter: Secondary | ICD-10-CM

## 2019-03-29 DIAGNOSIS — Z79899 Other long term (current) drug therapy: Secondary | ICD-10-CM

## 2019-04-06 ENCOUNTER — Ambulatory Visit (INDEPENDENT_AMBULATORY_CARE_PROVIDER_SITE_OTHER): Payer: Medicare Other

## 2019-04-06 ENCOUNTER — Other Ambulatory Visit: Payer: Self-pay

## 2019-04-06 DIAGNOSIS — R339 Retention of urine, unspecified: Secondary | ICD-10-CM

## 2019-04-06 DIAGNOSIS — Z9359 Other cystostomy status: Secondary | ICD-10-CM

## 2019-04-06 NOTE — Progress Notes (Signed)
Suprapubic Cath Change  Patient is present today for a suprapubic catheter change due to urinary retention.  39ml of water was drained from the balloon, a 16FR foley cath was removed from the tract with out difficulty.  Site was cleaned and prepped in a sterile fashion with betadine.  A 16FR foley cath was replaced into the tract no complications were noted. Urine return was noted, 10 ml of sterile water was inflated into the balloon. A bag was not attached at the patient and provider have decided to keep the catheter plugged in order to assist the patient with urinating on his own, which patient and wife state have overall been going well.   Preformed by: Sherril Cong, CMA (AAMA)   Follow up: RTC in 1 month to see [facility], discuss catheter removal

## 2019-04-15 ENCOUNTER — Encounter (INDEPENDENT_AMBULATORY_CARE_PROVIDER_SITE_OTHER): Payer: Self-pay | Admitting: Vascular Surgery

## 2019-04-15 DIAGNOSIS — I890 Lymphedema, not elsewhere classified: Secondary | ICD-10-CM | POA: Insufficient documentation

## 2019-04-15 NOTE — Progress Notes (Signed)
MRN : [id number]  Mike Holloway is a 78 y.o. (01-08-1941) male who presents with chief complaint of  Chief Complaint  Patient presents with  . New Patient (Initial Visit)    bilateral leg swelling  .  History of Present Illness:   Patient is seen for evaluation of leg pain and leg swelling. The patient first noticed the swelling remotely. The swelling is associated with pain and discoloration. The pain and swelling worsens with prolonged dependency and improves with elevation. The pain is unrelated to activity.  The patient notes that in the morning the legs are significantly improved but they steadily worsened throughout the course of the day. The patient also notes a steady worsening of the discoloration in the ankle and shin area.   The patient denies claudication symptoms.  The patient denies symptoms consistent with rest pain.  The patient denies and extensive history of DJD and LS spine disease.  The patient has no had any past angiography, interventions or vascular surgery.  Elevation makes the leg symptoms better, dependency makes them much worse. There is no history of ulcerations. The patient denies any recent changes in medications.  The patient has not been wearing graduated compression.  The patient denies a history of DVT or PE. There is no prior history of phlebitis. There is no history of primary lymphedema.  No history of malignancies. No history of trauma or groin or pelvic surgery. There is no history of radiation treatment to the groin or pelvis  The patient denies amaurosis fugax or recent TIA symptoms. There are no recent neurological changes noted. The patient denies recent episodes of angina or shortness of breath  Current Meds  Medication Sig  . acetaminophen (TYLENOL) 650 MG CR tablet Take 650 mg by mouth every 8 (eight) hours as needed for pain.  [name] aspirin EC 325 MG tablet Take 1 tablet (325 mg total) by mouth daily.  . cholecalciferol (VITAMIN D)  25 MCG (1000 UT) tablet Take 1,000 Units by mouth at bedtime.   . diclofenac sodium (VOLTAREN) 1 % GEL Apply 2 g topically 2 (two) times daily as needed (pain).   . famotidine (PEPCID) 20 MG tablet Take 20 mg by mouth 2 (two) times daily.   [name] FLUoxetine (PROZAC) 10 MG capsule Take 10 mg by mouth daily.   . furosemide (LASIX) 20 MG tablet Take by mouth.  [name] guaiFENesin-dextromethorphan (ROBITUSSIN DM) 100-10 MG/5ML syrup Take 5 mLs by mouth every 4 (four) hours as needed for cough.  . Melatonin 5 MG TABS Take 5 mg by mouth at bedtime.  . mirabegron ER (MYRBETRIQ) 50 MG TB24 tablet Take 1 tablet (50 mg total) by mouth daily.  . primidone (MYSOLINE) 50 MG tablet Take 50-100 mg by mouth 2 (two) times daily. Take 2 tablets (100mg ) by mouth every morning and 1 tablet (50mg ) by mouth every night at bedtime  . Probiotic Product (VSL#3) CAPS Take 1 capsule by mouth 2 (two) times daily.   . propranolol ER (INDERAL LA) 160 MG SR capsule Take 160 mg by mouth daily.   . psyllium (HYDROCIL/METAMUCIL) 95 % PACK Take 1 packet by mouth daily.  . tamsulosin (FLOMAX) 0.4 MG CAPS capsule Take 0.4 mg by mouth daily.  [name] UNABLE TO FIND APPLY FROM NECK DOWN DAILY    Past Medical History:  Diagnosis Date  . Arthritis   . Atrial flutter ([place])   . Diabetes mellitus ([place])   . Essential tremor   . Essential tremor  deep brain stimulator   . Hypercholesteremia   . Hypertension   . Incontinence   . 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 Sleep apnea   . Stroke [facility])     Past Surgical History:  Procedure Laterality Date  . ABLATION    . APPENDECTOMY    . CATARACT EXTRACTION, BILATERAL    . COLONOSCOPY WITH PROPOFOL N/A 03/17/2018   Procedure: COLONOSCOPY WITH PROPOFOL;  Surgeon: Jonathon Bellows, MD;  Location: [facility] ENDOSCOPY;  Service: Gastroenterology;  Laterality: N/A;  . DEEP BRAIN STIMULATOR PLACEMENT    . 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 HEMORRHOID SURGERY    . HERNIA REPAIR    . HIP FRACTURE SURGERY    . INTRAMEDULLARY (IM) NAIL INTERTROCHANTERIC Right 09/16/2018   Procedure: INTRAMEDULLARY (IM) NAIL INTERTROCHANTRIC;  Surgeon: Hessie Knows, MD;  Location: [facility] ORS;  Service: Orthopedics;  Laterality: Right;  . IR CATHETER TUBE CHANGE  11/22/2018  . NASAL SINUS SURGERY    . ORCHIECTOMY    . TONSILLECTOMY      Social History Social History   Tobacco Use  . Smoking status: Never Smoker  . Smokeless tobacco: Never Used  Substance Use Topics  . Alcohol use: No    Alcohol/week: 0.0 standard drinks  . Drug use: No    Family History Family History  Problem Relation Age of Onset  . Hematuria Father   . Prostate cancer Father   . Diabetes Father   . Heart disease Mother   . Diabetes Mother   . Kidney disease Neg Hx   . Bladder Cancer Neg Hx     Allergies  Allergen Reactions  . Clindamycin Diarrhea    Contracted C. Diff x 2  . Flagyl [Metronidazole] Swelling    Swollen tongue, excessive shaking,    . Ambien  [Zolpidem]     Other reaction(s): Other (See Comments) Disoriented and moody Other reaction(s): Other (See Comments) Disoriented and moody  . Penicillins Rash    Has patient had a PCN reaction causing immediate rash, facial/tongue/throat swelling, SOB or lightheadedness with hypotension: Unknown Has patient had a PCN reaction causing severe rash involving mucus membranes or skin necrosis: Unknown Has patient had a PCN reaction that required hospitalization: No Has patient had a PCN reaction occurring within the last 10 years: No If all of the above answers are "NO", then may proceed with Cephalosporin use.  . Sulfa Antibiotics Rash     REVIEW OF SYSTEMS (Negative unless checked)  Constitutional: [] Weight loss  [] Fever  [] Chills Cardiac: [] Chest pain   [] Chest pressure   [] Palpitations   [] Shortness of breath when  laying flat   [] Shortness of breath with exertion. Vascular:  [] Pain in legs with walking   [] Pain in legs at rest  [x] History of DVT   [] Phlebitis   [x] Swelling in legs   [] Varicose veins   [] Non-healing ulcers Pulmonary:   [] Uses home oxygen   [] Productive cough   [] Hemoptysis   [] Wheeze  [] COPD   [] Asthma Neurologic:  [] Dizziness   [] Seizures   [] History of stroke   [] History of TIA  [] Aphasia   [] Vissual changes   [] Weakness or numbness in arm   [] Weakness or numbness in leg Musculoskeletal:   [] Joint swelling   [] Joint pain   [] Low back pain Hematologic:  [] Easy bruising  [] Easy bleeding   [] Hypercoagulable state   []   Anemic Gastrointestinal:  [] Diarrhea   [] Vomiting  [x] Gastroesophageal reflux/heartburn   [] Difficulty swallowing. Genitourinary:  [] Chronic kidney disease   [] Difficult urination  [] Frequent urination   [] Blood in urine Skin:  [x] Rashes   [] Ulcers  Psychological:  [] History of anxiety   []  History of major depression.  Physical Examination  Vitals:   03/29/19 1426  BP: 127/77  Pulse: 71  Resp: 17  Weight: 201 lb (91.2 kg)  Height: 5\' 8"  (1.727 m)   Body mass index is 30.56 kg/m. Gen: WD/WN, NAD Head: [place]/AT, No temporalis wasting.  Ear/Nose/Throat: Hearing grossly intact, nares w/o erythema or drainage Eyes: PER, EOMI, sclera nonicteric.  Neck: Supple, no large masses.   Pulmonary:  Good air movement, no audible wheezing bilaterally, no use of accessory muscles.  Cardiac: RRR, no JVD Vascular:  scattered varicosities present bilaterally.  Mild venous stasis changes to the legs bilaterally.  2+ soft pitting edema Vessel Right Left  Radial Palpable Palpable  PT Palpable Palpable  DP Palpable Palpable  Gastrointestinal: Non-distended. No guarding/no peritoneal signs.  Musculoskeletal: M/S 5/5 throughout.  No deformity or atrophy.  Neurologic: CN 2-12 intact. Symmetrical.  Speech is fluent. Motor exam as listed above. Psychiatric: Judgment intact, Mood & affect  appropriate for pt's clinical situation. Dermatologic: venous rashes no ulcers noted.  No changes consistent with cellulitis. Lymph : No lichenification or skin changes of chronic lymphedema.  CBC Lab Results  Component Value Date   WBC 6.9 11/13/2018   HGB 11.6 (L) 11/13/2018   HCT 34.7 (L) 11/13/2018   MCV 98.9 11/13/2018   PLT 141 (L) 11/13/2018    BMET    Component Value Date/Time   NA 130 (L) 11/13/2018 1331   K 4.3 11/13/2018 1331   CL 99 11/13/2018 1331   CO2 24 11/13/2018 1331   GLUCOSE 113 (H) 11/13/2018 1331   BUN 17 11/13/2018 1331   CREATININE 1.01 11/13/2018 1331   CALCIUM 9.5 11/13/2018 1331   GFRNONAA >60 11/13/2018 1331   GFRAA >60 11/13/2018 1331   CrCl cannot be calculated (Patient's most recent lab result is older than the maximum 21 days allowed.).  COAG Lab Results  Component Value Date   INR 0.99 09/15/2018    Radiology No results found.   Assessment/Plan 1. Chronic venous insufficiency No surgery or intervention at this point in time.    I have had a long discussion with the patient regarding venous insufficiency and why it  causes symptoms. I have discussed with the patient the chronic skin changes that accompany venous insufficiency and the long term sequela such as infection and ulceration.  Patient will begin wearing graduated compression stockings class 1 (20-30 mmHg) or compression wraps on a daily basis a prescription was given. The patient will put the stockings on first thing in the morning and removing them in the evening. The patient is instructed specifically not to sleep in the stockings.    In addition, behavioral modification including several periods of elevation of the lower extremities during the day will be continued. I have demonstrated that proper elevation is a position with the ankles at heart level.  The patient is instructed to begin routine exercise, especially walking on a daily basis  Patient should undergo duplex  ultrasound of the venous system to ensure that DVT or reflux is not present.  Following the review of the ultrasound the patient will follow up in 2-3 months to reassess the degree of swelling and the control that graduated compression stockings or  compression wraps  is offering.   The patient can be assessed for a Lymph Pump at that time - VAS [place] LOWER EXTREMITY VENOUS (DVT); Future  2. Lymphedema I have had a long discussion with the patient regarding swelling and why it  causes symptoms.  Patient will begin wearing graduated compression stockings class 1 (20-30 mmHg) on a daily basis a prescription was given. The patient will  beginning wearing the stockings first thing in the morning and removing them in the evening. The patient is instructed specifically not to sleep in the stockings.   In addition, behavioral modification will be initiated.  This will include frequent elevation, use of over the counter pain medications and exercise such as walking.  I have reviewed systemic causes for chronic edema such as liver, kidney and cardiac etiologies.  The patient denies problems with these organ systems.    Consideration for a lymph pump will also be made based upon the effectiveness of conservative therapy.  This would help to improve the edema control and prevent sequela such as ulcers and infections   Patient should undergo duplex ultrasound of the venous system to ensure that DVT or reflux is not present.  The patient will follow-up with me after the ultrasound.   - VAS [place] LOWER EXTREMITY VENOUS (DVT); Future  3. Pure hypercholesterolemia Continue statin as ordered and reviewed, no changes at this time   4. Type 2 diabetes mellitus without complication, with long-term current use of insulin (HCC) Continue hypoglycemic medications as already ordered, these medications have been reviewed and there are no changes at this time.  Hgb A1C to be monitored as already arranged by primary service    5. Benign essential HTN Continue antihypertensive medications as already ordered, these medications have been reviewed and there are no changes at this time.   6. Atrial flutter, paroxysmal (HCC) Continue antiarrhythmia medications as already ordered, these medications have been reviewed and there are no changes at this time.  Continue anticoagulation as ordered by Cardiology Service     Hortencia Pilar, MD  04/15/2019 5:45 PM

## 2019-04-30 ENCOUNTER — Telehealth: Payer: Self-pay | Admitting: Urology

## 2019-04-30 DIAGNOSIS — R351 Nocturia: Secondary | ICD-10-CM

## 2019-04-30 MED ORDER — MIRABEGRON ER 50 MG PO TB24: 50.0000 mg | ORAL_TABLET | Freq: Every day | ORAL | 11 refills | Status: DC

## 2019-04-30 NOTE — Telephone Encounter (Signed)
Patient's wife called and stated that his insurance will no longer fill his script for mirabegron ER (MYRBETRIQ) 50 MG TB24 tablet under Sninsky's name since he never saw him in the clinic. She said they need a new one under Stoioff's name called in to the pharmacy today. He only has one pill left.  Sharyn Lull

## 2019-04-30 NOTE — Telephone Encounter (Signed)
New RX sent in under Stoioff.

## 2019-05-07 ENCOUNTER — Ambulatory Visit: Payer: Medicare Other | Attending: Orthopedic Surgery | Admitting: Physical Therapy

## 2019-05-07 ENCOUNTER — Other Ambulatory Visit: Payer: Self-pay

## 2019-05-07 DIAGNOSIS — R269 Unspecified abnormalities of gait and mobility: Secondary | ICD-10-CM | POA: Diagnosis present

## 2019-05-07 DIAGNOSIS — R293 Abnormal posture: Secondary | ICD-10-CM | POA: Diagnosis present

## 2019-05-07 DIAGNOSIS — M6281 Muscle weakness (generalized): Secondary | ICD-10-CM | POA: Diagnosis present

## 2019-05-09 ENCOUNTER — Encounter: Payer: Self-pay | Admitting: Physical Therapy

## 2019-05-09 ENCOUNTER — Ambulatory Visit: Payer: Medicare Other | Admitting: Physical Therapy

## 2019-05-09 NOTE — Therapy (Signed)
[facility] [address] [place], [place], [address] Phone: [phone]   Fax:  [phone]  Physical Therapy Evaluation  Patient Details  Name: Mike Holloway MRN: [id number] Date of Birth: 1941/04/21 Referring Provider (PT): Dr. Rudene Christians   Encounter Date: 05/07/2019  PT End of Session - 05/09/19 1017    Visit Number  1    Number of Visits  8    Date for PT Re-Evaluation  06/04/19    Authorization - Visit Number  1    Authorization - Number of Visits  10    PT Start Time  1111    PT Stop Time  1210    PT Time Calculation (min)  59 min    Equipment Utilized During Treatment  Gait belt    Activity Tolerance  Patient tolerated treatment well    Behavior During Therapy  [facility] for tasks assessed/performed       Past Medical History:  Diagnosis Date  . Arthritis   . Atrial flutter ([place])   . Diabetes mellitus ([place])   . Essential tremor   . Essential tremor    deep brain stimulator   . Hypercholesteremia   . Hypertension   . Incontinence   . 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[place])    grew on the testical  . Sleep apnea   . Stroke [facility])     Past Surgical History:  Procedure Laterality Date  . ABLATION    . APPENDECTOMY    . CATARACT EXTRACTION, BILATERAL    . COLONOSCOPY WITH PROPOFOL N/A 03/17/2018   Procedure: COLONOSCOPY WITH PROPOFOL;  Surgeon: Jonathon Bellows, MD;  Location: [facility] ENDOSCOPY;  Service: Gastroenterology;  Laterality: N/A;  . DEEP BRAIN STIMULATOR PLACEMENT    . 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 HEMORRHOID SURGERY    . HERNIA REPAIR    . HIP FRACTURE SURGERY    . INTRAMEDULLARY (IM) NAIL INTERTROCHANTERIC Right 09/16/2018   Procedure: INTRAMEDULLARY (IM) NAIL INTERTROCHANTRIC;  Surgeon: Hessie Knows, MD;  Location: [facility] ORS;  Service: Orthopedics;  Laterality: Right;  . IR CATHETER TUBE  CHANGE  11/22/2018  . NASAL SINUS SURGERY    . ORCHIECTOMY    . TONSILLECTOMY      There were no vitals filed for this visit.   Subjective Assessment - 05/09/19 1019    Subjective  Pt. s/p R hip ORIF after fall at home on 09/16/2019.  Pt. known well to PT clinic after extensive PT for LE strengthening/ gait/ balance training last year after readjustment of DBS.    Patient is accompained by:  Family member    Limitations  Lifting;Standing;Walking;House hold activities    Patient Stated Goals  Improve LE strength/ gait and balance with daily tasks.  Improve gait/ safety/ progression to least assistive device.    Currently in Pain?  Yes    Pain Score  1     Pain Location  Hip    Pain Orientation  Right    Pain Descriptors / Indicators  Aching    Pain Type  Chronic pain    Pain Radiating Towards  Slight radiating symptoms into R knee/lower leg    Pain Onset  More than a month ago    Pain Relieving Factors  rest/ ice         [facility] PT Assessment - 05/09/19 0001      Assessment  Medical Diagnosis  S/p R subtrochanteric hip fracture, S/p R ORIF fracture of hip.      Referring Provider (PT)  Dr. Rudene Christians    Onset Date/Surgical Date  09/15/18    Prior Therapy  Yes, known well to PT      Precautions   Precautions  Fall      Restrictions   Weight Bearing Restrictions  No      Balance Screen   Has the patient fallen in the past 6 months  No    Has the patient had a decrease in activity level because of a fear of falling?   Yes    Is the patient reluctant to leave their home because of a fear of falling?   Yes      [place] residence      Prior Function   Level of Independence  Needs assistance with gait    Vocation  Retired         See HEP/ reviewed [place].     PT Education - 05/09/19 1059    Education Details  Reviewed HEP/ Gait training with RW    Person(s) Educated  Patient;Spouse    Methods  Explanation;Demonstration;Handout     Comprehension  Verbalized understanding;Returned demonstration          PT Long Term Goals - 05/09/19 1142      PT LONG TERM GOAL #1   Title  Pt. independent with HEP to increase B hip flexion/ R quad muscle strength 1/2 muscle grade to improve pain-free mobility.    Baseline  Decrease B LE muscle strength: hip flexion 4/5 MMT, L quad 4+/5 MMT, R quad 3+/5 MMT, B hamstring 5/5 MMT, B DF/PF 5/5 MMT. B UE AROM WFL and strength grossly 5/5 MMT except R shoulder flexion 4+/5 MMT.    Time  4    Period  Weeks    Status  New    Target Date  06/04/19      PT LONG TERM GOAL #2   Title  Pt. will increase Berg balance test to >40 out of 56 to improve independence with gait/ decrease fall risk.    Baseline  Berg: 22/56 (significant fall risk)    Time  4    Period  Weeks    Status  New    Target Date  06/04/19      PT LONG TERM GOAL #3   Title  Pt. able to ambulate 100 feet with consistent 2 point gait pattern and use of least restrictive assistive devce to improve household mobility.    Baseline  Pt. able to ambulate with use of RW/ CGA to min. A for safety.  Decrease step pattern/ length/ heel strike.    Time  4    Period  Weeks    Status  New    Target Date  06/04/19      PT LONG TERM GOAL #4   Title  Pt. able to stand from normal chair with no UE assist to improve safety/independence with transfers.     Baseline  Unable to stand from standard chair without heavy UE assist.    Time  4    Period  Weeks    Status  New    Target Date  06/04/19      PT LONG TERM GOAL #5   Title  --    Baseline  --    Time  --    Period  --  Status  --        Plan - 05/09/19 1018    Clinical Impression Statement  Pt. is a pleasant 78 y/o male s/p R hip ORIF after fall on 09/15/2018.  Pt. known well to PT clinic after extensive PT in 2019 for LE strength/ balance/ gait training after readjustment of DBS.  Pt. reports 1.5/10 R hip/LE pain at rest in seated posture, 0/10 pain in bed and >4/10 with  increase standing/ walking.  Pt. presents with 1/2 inch external heel lift on R and increase R lower leg swelling noted as compared to L. Pt. has difficulty standing from low chair/ surfaces at home and requires assist with short distance ambulation with RW.  Decrease B LE muscle strength: hip flexion 4/5 MMT, L quad 4+/5 MMT, R quad 3+/5 MMT, B hamstring 5/5 MMT, B DF/PF 5/5 MMT.  B UE AROM WFL and strength grossly 5/5 MMT except R shoulder flexion 4+/5 MMT.  Pt. able to complete 4x sit to stand in 30 seconds.  Gait speed: 0.05 m/s.  Berg: 22/56 (significant fall risk).  Pt. ambulates with slow, R antalgic gait pattern with decrease step length/ heel strike with use of RW and CGA.  Pt. will benefit from skilled PT services to increase B LE muscle strength/ mod. independence with gait/ balance.    Stability/Clinical Decision Making  Evolving/Moderate complexity    Clinical Decision Making  Moderate    Rehab Potential  Good    PT Frequency  2x / week    PT Duration  4 weeks    PT Treatment/Interventions  ADLs/Self Care Home Management;Therapeutic activities;Functional mobility training;Stair training;Gait training;Therapeutic exercise;Balance training;Neuromuscular re-education;Patient/family education;Manual techniques;Passive range of motion    PT Next Visit Plan  Progress LE strengthening/ gait endurance/ safety    PT Home Exercise Plan  See handouts    Consulted and Agree with Plan of Care  Patient;Family member/caregiver    Family Member Consulted  spouse: [name]       Patient will benefit from skilled therapeutic intervention in order to improve the following deficits and impairments:  Abnormal gait, Decreased balance, Decreased endurance, Decreased mobility, Difficulty walking, Decreased range of motion, Decreased activity tolerance, Decreased strength, Impaired flexibility, Postural dysfunction, Pain  Visit Diagnosis: 1. Muscle weakness (generalized)   2. Gait difficulty   3. Abnormal posture         Problem List Patient Active Problem List   Diagnosis Date Noted  . Lymphedema 04/15/2019  . Hyponatremia 12/24/2018  . SIADH (syndrome of inappropriate ADH production) ([place]) 12/24/2018  . Erosion of urethra due to catheterization of urinary tract ([place]) 10/27/2018  . Generalized weakness 10/14/2018  . Hip fracture ([place]) 09/15/2018  . 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 HLD (hyperlipidemia) 03/24/2018  . Contusion of right knee 02/14/2018  . Bradycardia 12/28/2017  . 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 Chronic pain of right knee 08/10/2016  . Right ankle pain 08/10/2016  . 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  . 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[place]) 12/11/2015  . Urinary retention 09/08/2015  . Lymphoma, 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[place]) 04/03/2015  . Breathlessness on exertion 11/21/2014  . Breath shortness 11/21/2014  . Arthropathy 11/07/2014  . Atrial flutter, paroxysmal ([place]) 11/07/2014  . Type 2 diabetes mellitus ([place]) 11/07/2014  . Benign essential tremor 11/07/2014  . Benign essential HTN 11/07/2014  . Mixed incontinence 11/07/2014  . Hypercholesterolemia without hypertriglyceridemia 11/07/2014  . Apnea, sleep 11/07/2014  .  Controlled type 2 diabetes mellitus without complication ([place]) 56/81/2751  . Pure hypercholesterolemia 11/07/2014  . Other abnormalities of gait and mobility 11/02/2011  . Abnormal gait 06/29/2011  . Discoordination 06/29/2011   Pura Spice, PT, DPT # [id number] 05/09/2019, 12:38 PM  [facility] [address] [place], [place], [address] Phone: [phone]   Fax:  [phone]  Name: Mike Holloway MRN: [id number] Date of Birth: May 02, 1941

## 2019-05-09 NOTE — Progress Notes (Signed)
05/10/2019 2:29 PM   [name] 06-01-1941 [id number]  Referring provider: [name], MD [facility],  [place] [address]  Chief Complaint  Patient presents with  . Follow-up    HPI: Mr. Vandeberg is a 78 year old Caucasian male with urinary retention managed with a SPT who presents today to review his residual logs for possible SPT removal with his wife, [name].  The SPT has been capped for two months now and he is voiding through his urethra without difficulty.  There have not been episodes of suprapubic pain or urinary retention.  [name] still notes some bleeding around the SPT site.    She has kept a voiding log and he is having good urinary output.  Patient denies any gross hematuria, dysuria or suprapubic/flank pain.  Patient denies any fevers, chills, nausea or vomiting.   PMH: Past Medical History:  Diagnosis Date  . Arthritis   . Atrial flutter ([place])   . Diabetes mellitus ([place])   . Essential tremor   . Essential tremor    deep brain stimulator   . Hypercholesteremia   . Hypertension   . Incontinence   . 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[place])    grew on the testical  . Sleep apnea   . Stroke [facility])     Surgical History: Past Surgical History:  Procedure Laterality Date  . ABLATION    . APPENDECTOMY    . CATARACT EXTRACTION, BILATERAL    . COLONOSCOPY WITH PROPOFOL N/A 03/17/2018   Procedure: COLONOSCOPY WITH PROPOFOL;  Surgeon: Jonathon Bellows, MD;  Location: [facility] ENDOSCOPY;  Service: Gastroenterology;  Laterality: N/A;  . DEEP BRAIN STIMULATOR PLACEMENT    . 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 HEMORRHOID SURGERY    . HERNIA REPAIR    . HIP FRACTURE SURGERY    . INTRAMEDULLARY (IM) NAIL INTERTROCHANTERIC Right 09/16/2018   Procedure: INTRAMEDULLARY (IM) NAIL INTERTROCHANTRIC;  Surgeon: Hessie Knows, MD;  Location: [facility] ORS;   Service: Orthopedics;  Laterality: Right;  . IR CATHETER TUBE CHANGE  11/22/2018  . NASAL SINUS SURGERY    . ORCHIECTOMY    . TONSILLECTOMY      Home Medications:  Allergies as of 05/10/2019      Reactions   Clindamycin Diarrhea   Contracted C. Diff x 2   Flagyl [metronidazole] Swelling   Swollen tongue, excessive shaking,     Ambien  [zolpidem]    Other reaction(s): Other (See Comments) Disoriented and moody Other reaction(s): Other (See Comments) Disoriented and moody   Penicillins Rash   Has patient had a PCN reaction causing immediate rash, facial/tongue/throat swelling, SOB or lightheadedness with hypotension: Unknown Has patient had a PCN reaction causing severe rash involving mucus membranes or skin necrosis: Unknown Has patient had a PCN reaction that required hospitalization: No Has patient had a PCN reaction occurring within the last 10 years: No If all of the above answers are "NO", then may proceed with Cephalosporin use.   Sulfa Antibiotics Rash      Medication List       Accurate as of May 10, 2019  2:29 PM. If you have any questions, ask your nurse or doctor.        acetaminophen 650 MG CR tablet Commonly known as: TYLENOL Take 650 mg by mouth every 8 (eight) hours as needed for pain.   aspirin EC 325  MG tablet Take 1 tablet (325 mg total) by mouth daily.   Azelastine HCl 0.15 % Soln U 1 SPRAY IEN BID   cholecalciferol 25 MCG (1000 UT) tablet Commonly known as: VITAMIN D Take 1,000 Units by mouth at bedtime.   diclofenac sodium 1 % Gel Commonly known as: VOLTAREN Apply 2 g topically 2 (two) times daily as needed (pain).   famotidine 20 MG tablet Commonly known as: PEPCID Take 20 mg by mouth 2 (two) times daily.   fexofenadine 180 MG tablet Commonly known as: ALLEGRA TK 1 T PO ONCE D   FLUoxetine 10 MG capsule Commonly known as: PROZAC Take 10 mg by mouth daily.   furosemide 20 MG tablet Commonly known as: LASIX Take by mouth.    guaiFENesin-dextromethorphan 100-10 MG/5ML syrup Commonly known as: ROBITUSSIN DM Take 5 mLs by mouth every 4 (four) hours as needed for cough.   Melatonin 5 MG Tabs Take 5 mg by mouth at bedtime.   mirabegron ER 50 MG Tb24 tablet Commonly known as: MYRBETRIQ Take 1 tablet (50 mg total) by mouth daily.   primidone 50 MG tablet Commonly known as: MYSOLINE Take 50-100 mg by mouth 2 (two) times daily. Take 2 tablets (100mg ) by mouth every morning and 1 tablet (50mg ) by mouth every night at bedtime   propranolol ER 160 MG SR capsule Commonly known as: INDERAL LA Take 160 mg by mouth daily.   psyllium 95 % Pack Commonly known as: HYDROCIL/METAMUCIL Take 1 packet by mouth daily.   tamsulosin 0.4 MG Caps capsule Commonly known as: FLOMAX Take 0.4 mg by mouth daily.   UNABLE TO FIND APPLY FROM NECK DOWN DAILY   VSL#3 Caps Take 1 capsule by mouth 2 (two) times daily.       Allergies:  Allergies  Allergen Reactions  . Clindamycin Diarrhea    Contracted C. Diff x 2  . Flagyl [Metronidazole] Swelling    Swollen tongue, excessive shaking,    . Ambien  [Zolpidem]     Other reaction(s): Other (See Comments) Disoriented and moody Other reaction(s): Other (See Comments) Disoriented and moody  . Penicillins Rash    Has patient had a PCN reaction causing immediate rash, facial/tongue/throat swelling, SOB or lightheadedness with hypotension: Unknown Has patient had a PCN reaction causing severe rash involving mucus membranes or skin necrosis: Unknown Has patient had a PCN reaction that required hospitalization: No Has patient had a PCN reaction occurring within the last 10 years: No If all of the above answers are "NO", then may proceed with Cephalosporin use.  . Sulfa Antibiotics Rash    Family History: Family History  Problem Relation Age of Onset  . Hematuria Father   . Prostate cancer Father   . Diabetes Father   . Heart disease Mother   . Diabetes Mother   . Kidney  disease Neg Hx   . Bladder Cancer Neg Hx     Social History:  reports that he has never smoked. He has never used smokeless tobacco. He reports that he does not drink alcohol or use drugs.  ROS: UROLOGY Frequent Urination?: Yes Hard to postpone urination?: Yes Burning/pain with urination?: No Get up at night to urinate?: No Leakage of urine?: No Urine stream starts and stops?: Yes Trouble starting stream?: Yes Do you have to strain to urinate?: Yes Blood in urine?: No Urinary tract infection?: No Sexually transmitted disease?: No Injury to kidneys or bladder?: No Painful intercourse?: No Weak stream?: Yes Erection problems?: No  Penile pain?: No  Gastrointestinal Nausea?: No Vomiting?: No Indigestion/heartburn?: No Diarrhea?: No Constipation?: No  Constitutional Fever: No Night sweats?: No Weight loss?: No Fatigue?: Yes  Skin Skin rash/lesions?: No Itching?: No  Eyes Blurred vision?: No Double vision?: No  Ears/Nose/Throat Sore throat?: No Sinus problems?: Yes  Hematologic/Lymphatic Swollen glands?: No Easy bruising?: No  Cardiovascular Leg swelling?: Yes Chest pain?: No  Respiratory Cough?: No Shortness of breath?: No  Endocrine Excessive thirst?: No  Musculoskeletal Back pain?: No Joint pain?: No  Neurological Headaches?: No Dizziness?: No  Psychologic Depression?: Yes Anxiety?: No  Physical Exam: BP 126/72   Pulse 69   Wt 201 lb (91.2 kg)   BMI 30.56 kg/m   Constitutional:  Well nourished. Alert and oriented, No acute distress. HEENT: [place] AT, moist mucus membranes.  Trachea midline, no masses. Cardiovascular: No clubbing, cyanosis, or edema. Respiratory: Normal respiratory effort, no increased work of breathing. GI: Abdomen is soft, non tender, non distended, no abdominal masses. SPT site clean and dry.  Neurologic: Grossly intact, no focal deficits, moving all 4 extremities. Psychiatric: Normal mood and affect.  Catheter  Removal Patient is present today for a suprapubic catheter removal.  10 ml of water was drained from the balloon. A 16 FR foley cath was removed from the bladder no complications were noted. Patient tolerated well.  Performed by: Zara Council, PA-C  Assessment & Plan:    1. History of urinary retention SPT removed today RTC in one month for I PSS and PVR  Patient is advised that if they should start to experience pain that is not able to be controlled with pain medication, intractable nausea and/or vomiting and/or fevers greater than 103 or shaking chills to contact the office immediately or seek treatment in the emergency department for emergent intervention.     2. History of gross hematuria Likely due to SPT trauma and/or infections Will recheck UA upon return in one month  3. Nocturia Will address upon return in one month  Zara Council, PA-C  [facility] [address] [facility], [place] [address] [phone]

## 2019-05-10 ENCOUNTER — Encounter: Payer: Self-pay | Admitting: Urology

## 2019-05-10 ENCOUNTER — Other Ambulatory Visit: Payer: Self-pay

## 2019-05-10 ENCOUNTER — Ambulatory Visit (INDEPENDENT_AMBULATORY_CARE_PROVIDER_SITE_OTHER): Payer: Medicare Other | Admitting: Urology

## 2019-05-10 VITALS — BP 126/72 | HR 69 | Wt 201.0 lb

## 2019-05-10 DIAGNOSIS — Z87898 Personal history of other specified conditions: Secondary | ICD-10-CM

## 2019-05-10 DIAGNOSIS — R339 Retention of urine, unspecified: Secondary | ICD-10-CM | POA: Diagnosis not present

## 2019-05-10 DIAGNOSIS — R351 Nocturia: Secondary | ICD-10-CM

## 2019-05-10 DIAGNOSIS — Z87448 Personal history of other diseases of urinary system: Secondary | ICD-10-CM | POA: Diagnosis not present

## 2019-05-10 LAB — BLADDER SCAN AMB NON-IMAGING: Scan Result: 208

## 2019-05-11 ENCOUNTER — Ambulatory Visit: Payer: Medicare Other | Admitting: Physical Therapy

## 2019-05-11 ENCOUNTER — Encounter: Payer: Self-pay | Admitting: Physical Therapy

## 2019-05-11 DIAGNOSIS — R269 Unspecified abnormalities of gait and mobility: Secondary | ICD-10-CM

## 2019-05-11 DIAGNOSIS — M6281 Muscle weakness (generalized): Secondary | ICD-10-CM

## 2019-05-11 DIAGNOSIS — R293 Abnormal posture: Secondary | ICD-10-CM

## 2019-05-11 NOTE — Therapy (Signed)
[facility] [address] [place], [place], [address] Phone: [phone]   Fax:  [phone]  Physical Therapy Treatment  Patient Details  Name: Mike Holloway MRN: [id number] Date of Birth: October 10, 1940 Referring Provider (PT): Dr. Rudene Christians   Encounter Date: 05/11/2019  PT End of Session - 05/11/19 1920    Visit Number  2    Number of Visits  8    Date for PT Re-Evaluation  06/04/19    Authorization - Visit Number  2    Authorization - Number of Visits  10    PT Start Time  1110    PT Stop Time  1208    PT Time Calculation (min)  58 min    Equipment Utilized During Treatment  Gait belt    Activity Tolerance  Patient tolerated treatment well;Patient limited by pain    Behavior During Therapy  [facility] for tasks assessed/performed       Past Medical History:  Diagnosis Date  . Arthritis   . Atrial flutter ([place])   . Diabetes mellitus ([place])   . Essential tremor   . Essential tremor    deep brain stimulator   . Hypercholesteremia   . Hypertension   . Incontinence   . 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[place])    grew on the testical  . Sleep apnea   . Stroke [facility])     Past Surgical History:  Procedure Laterality Date  . ABLATION    . APPENDECTOMY    . CATARACT EXTRACTION, BILATERAL    . COLONOSCOPY WITH PROPOFOL N/A 03/17/2018   Procedure: COLONOSCOPY WITH PROPOFOL;  Surgeon: Jonathon Bellows, MD;  Location: [facility] ENDOSCOPY;  Service: Gastroenterology;  Laterality: N/A;  . DEEP BRAIN STIMULATOR PLACEMENT    . 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 HEMORRHOID SURGERY    . HERNIA REPAIR    . HIP FRACTURE SURGERY    . INTRAMEDULLARY (IM) NAIL INTERTROCHANTERIC Right 09/16/2018   Procedure: INTRAMEDULLARY (IM) NAIL INTERTROCHANTRIC;  Surgeon: Hessie Knows, MD;  Location: [facility] ORS;  Service: Orthopedics;  Laterality: Right;   . IR CATHETER TUBE CHANGE  11/22/2018  . NASAL SINUS SURGERY    . ORCHIECTOMY    . TONSILLECTOMY      There were no vitals filed for this visit.  Subjective Assessment - 05/11/19 1918    Subjective  Pt. reports he had suprapubic catheter removed yesterday. Pt. reports 1/10 R knee pain prior to tx. session.  Pt. entered PT with use of RW from vehicle to rehab gym with CGA from SPT.    Patient is accompained by:  Family member    Patient Stated Goals  Improve LE strength/ gait and balance with daily tasks.  Improve gait/ safety/ progression to least assistive device.    Currently in Pain?  Yes    Pain Score  1     Pain Location  Knee    Pain Orientation  Right    Pain Descriptors / Indicators  Aching    Pain Type  Chronic pain         There.ex.:  Ambulate in PT clinic with RW and CGA for cuing to correct posture/ increase step length (55 feet/ 60 feet/ 50 feet) Sit to stands from chair with min. UE assist/ cuing for proper technique Standing hip there.ex. (see handouts)- 2# ankle wt. Seated LAQ (2#)/ heel and  toe raises (5#) 30x Seated on blue mat table: hamstring curls with RTB 20x/ posture correction Scifit L5.5 10 min. B UE/LE (no increase c/o R knee pain) Discussed home walking program    PT Education - 05/11/19 1920    Education Details  See HEP    Person(s) Educated  Patient;Spouse    Methods  Explanation;Demonstration;Handout;Verbal cues    Comprehension  Verbalized understanding;Returned demonstration          PT Long Term Goals - 05/09/19 1142      PT LONG TERM GOAL #1   Title  Pt. independent with HEP to increase B hip flexion/ R quad muscle strength 1/2 muscle grade to improve pain-free mobility.    Baseline  Decrease B LE muscle strength: hip flexion 4/5 MMT, L quad 4+/5 MMT, R quad 3+/5 MMT, B hamstring 5/5 MMT, B DF/PF 5/5 MMT. B UE AROM WFL and strength grossly 5/5 MMT except R shoulder flexion 4+/5 MMT.    Time  4    Period  Weeks    Status  New     Target Date  06/04/19      PT LONG TERM GOAL #2   Title  Pt. will increase Berg balance test to >40 out of 56 to improve independence with gait/ decrease fall risk.    Baseline  Berg: 22/56 (significant fall risk)    Time  4    Period  Weeks    Status  New    Target Date  06/04/19      PT LONG TERM GOAL #3   Title  Pt. able to ambulate 100 feet with consistent 2 point gait pattern and use of least restrictive assistive devce to improve household mobility.    Baseline  Pt. able to ambulate with use of RW/ CGA to min. A for safety.  Decrease step pattern/ length/ heel strike.    Time  4    Period  Weeks    Status  New    Target Date  06/04/19      PT LONG TERM GOAL #4   Title  Pt. able to stand from normal chair with no UE assist to improve safety/independence with transfers.     Baseline  Unable to stand from standard chair without heavy UE assist.    Time  4    Period  Weeks    Status  New    Target Date  06/04/19      PT LONG TERM GOAL #5   Title  --    Baseline  --    Time  --    Period  --    Status  --            Plan - 05/11/19 1921    Clinical Impression Statement  Pt. worked hard during PT tx. session with seated/ standing ther.ex. but increase R knee pain reports with prolonged standing/ hip ex.  Pt. is planning to contact Dr. Holley Raring to discuss another injection in knee to manage pain/ symptoms.  Pt. ambulates slow but controlled with decrease step length while using RW in clinic.  Moderate cuing to correct posture/ complete ther.ex. with proper technique.  See new HEP.    Stability/Clinical Decision Making  Evolving/Moderate complexity    Clinical Decision Making  Moderate    Rehab Potential  Good    PT Frequency  2x / week    PT Duration  4 weeks    PT Treatment/Interventions  ADLs/Self Care Home Management;Therapeutic  activities;Functional mobility training;Stair training;Gait training;Therapeutic exercise;Balance training;Neuromuscular  re-education;Patient/family education;Manual techniques;Passive range of motion    PT Next Visit Plan  Progress LE strengthening/ gait endurance/ safety    PT Home Exercise Plan  See handouts    Consulted and Agree with Plan of Care  Patient;Family member/caregiver    Family Member Consulted  spouse: [name]       Patient will benefit from skilled therapeutic intervention in order to improve the following deficits and impairments:  Abnormal gait, Decreased balance, Decreased endurance, Decreased mobility, Difficulty walking, Decreased range of motion, Decreased activity tolerance, Decreased strength, Impaired flexibility, Postural dysfunction, Pain  Visit Diagnosis: 1. Muscle weakness (generalized)   2. Gait difficulty   3. Abnormal posture        Problem List Patient Active Problem List   Diagnosis Date Noted  . Lymphedema 04/15/2019  . Hyponatremia 12/24/2018  . SIADH (syndrome of inappropriate ADH production) ([place]) 12/24/2018  . Erosion of urethra due to catheterization of urinary tract ([place]) 10/27/2018  . Generalized weakness 10/14/2018  . Hip fracture ([place]) 09/15/2018  . 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 HLD (hyperlipidemia) 03/24/2018  . Contusion of right knee 02/14/2018  . Bradycardia 12/28/2017  . 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 Chronic pain of right knee 08/10/2016  . Right ankle pain 08/10/2016  . 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  . 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[place]) 12/11/2015  . Urinary retention 09/08/2015  . Lymphoma, 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[place]) 04/03/2015  . Breathlessness on exertion 11/21/2014  . Breath shortness 11/21/2014  . Arthropathy 11/07/2014  . Atrial flutter, paroxysmal ([place]) 11/07/2014  . Type 2 diabetes mellitus ([place]) 11/07/2014  . Benign essential tremor 11/07/2014  . Benign essential HTN 11/07/2014  . Mixed  incontinence 11/07/2014  . Hypercholesterolemia without hypertriglyceridemia 11/07/2014  . Apnea, sleep 11/07/2014  . Controlled type 2 diabetes mellitus without complication ([place]) 47/82/9562  . Pure hypercholesterolemia 11/07/2014  . Other abnormalities of gait and mobility 11/02/2011  . Abnormal gait 06/29/2011  . Discoordination 06/29/2011   Pura Spice, PT, DPT # [phone] 05/11/2019, 7:29 PM  [facility] [address] [place], [place], [address] Phone: [phone]   Fax:  [phone]  Name: Dimitrius Steedman MRN: [id number] Date of Birth: 1941-07-24

## 2019-05-11 NOTE — Patient Instructions (Signed)
Access Code: [email or web address]  URL: [email or web address]  Date: 05/11/2019  Prepared by: Dorcas Carrow   Exercises  Mini Squat with Counter Support - 10 reps - 1 sets - 2x daily - 7x weekly  Standing Hip Abduction with Counter Support - 20 reps - 1 sets - 1x daily - 7x weekly  Standing Hip Extension with Counter Support - 20 reps - 1 sets - 2x daily - 7x weekly  Standing March with Unilateral Counter Support - 20 reps - 1 sets - 2x daily - 7x weekly  Heel rises with counter support - 20 reps - 1 sets - 2x daily - 7x weekly

## 2019-05-14 ENCOUNTER — Other Ambulatory Visit: Payer: Self-pay

## 2019-05-14 ENCOUNTER — Ambulatory Visit: Payer: Medicare Other | Admitting: Physical Therapy

## 2019-05-14 DIAGNOSIS — M6281 Muscle weakness (generalized): Secondary | ICD-10-CM

## 2019-05-14 DIAGNOSIS — R269 Unspecified abnormalities of gait and mobility: Secondary | ICD-10-CM

## 2019-05-14 DIAGNOSIS — R293 Abnormal posture: Secondary | ICD-10-CM

## 2019-05-15 ENCOUNTER — Encounter: Payer: Self-pay | Admitting: Physical Therapy

## 2019-05-15 NOTE — Therapy (Signed)
[facility] [address]. [place], [place], [address] Phone: [phone]   Fax:  [phone]  Physical Therapy Treatment  Patient Details  Name: Mike Holloway MRN: [id number] Date of Birth: 01/12/1941 Referring Provider (PT): Dr. Rudene Christians   Encounter Date: 05/14/2019  PT End of Session - 05/15/19 1125    Visit Number  3    Number of Visits  8    Date for PT Re-Evaluation  06/04/19    Authorization - Visit Number  3    Authorization - Number of Visits  10    PT Start Time  1346    PT Stop Time  4481    PT Time Calculation (min)  56 min    Equipment Utilized During Treatment  Gait belt    Activity Tolerance  Patient tolerated treatment well;Patient limited by pain    Behavior During Therapy  [facility] for tasks assessed/performed       Past Medical History:  Diagnosis Date  . Arthritis   . Atrial flutter ([place])   . Diabetes mellitus ([place])   . Essential tremor   . Essential tremor    deep brain stimulator   . Hypercholesteremia   . Hypertension   . Incontinence   . 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 Sleep apnea   . Stroke [facility])     Past Surgical History:  Procedure Laterality Date  . ABLATION    . APPENDECTOMY    . CATARACT EXTRACTION, BILATERAL    . COLONOSCOPY WITH PROPOFOL N/A 03/17/2018   Procedure: COLONOSCOPY WITH PROPOFOL;  Surgeon: Jonathon Bellows, MD;  Location: [facility] ENDOSCOPY;  Service: Gastroenterology;  Laterality: N/A;  . DEEP BRAIN STIMULATOR PLACEMENT    . 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 HEMORRHOID SURGERY    . HERNIA REPAIR    . HIP FRACTURE SURGERY    . INTRAMEDULLARY (IM) NAIL INTERTROCHANTERIC Right 09/16/2018   Procedure: INTRAMEDULLARY (IM) NAIL INTERTROCHANTRIC;  Surgeon: Hessie Knows, MD;  Location: [facility] ORS;  Service: Orthopedics;  Laterality: Right;   . IR CATHETER TUBE CHANGE  11/22/2018  . NASAL SINUS SURGERY    . ORCHIECTOMY    . TONSILLECTOMY      There were no vitals filed for this visit.  Subjective Assessment - 05/15/19 1117    Subjective  Pt reports he hurt R knee and R hip since previous PT session and that pain has increased. Pt reports he feels like "hardware is moving around in there," when referring to R hip. Pt reports he has upcoming doctor's appointment.    Patient is accompained by:  Family member    Patient Stated Goals  Improve LE strength/ gait and balance with daily tasks.  Improve gait/ safety/ progression to least assistive device.    Currently in Pain?  Yes    Pain Score  6     Pain Location  Hip    Pain Orientation  Right      Therapeutic exercise:  Ambulated with RW, CGA through clinic to chair Standing marches/gait in // bars 6x - SPT palpating for differences in R vs L hip, with no differences/abnormalities noted. Supine AROM/AAROM/PROM R hip flexion/extension/IR/ER x several repetitions.Pt with increased pain with IR/ER AAROM/AROM/PROM that decreased once out of position. Supine AROM/AAROM/ PROM R knee flexion/extension - Pt with pain medial and lateral  R knee--- no pain with passive ROM of knee flexion/extension, but pain with MMT of R knee flexion/extension over anterior knee Supine SLR - 2x12 B, AROM and AAROM from SPT Supine SAQ with green bolster - 2x15 AROM and AAROM Seated glute squeezes 20x. Cuing provided for technique Seated manually resisted DF/PF 10x each on B LEs       PT Long Term Goals - 05/09/19 1142      PT LONG TERM GOAL #1   Title  Pt. independent with HEP to increase B hip flexion/ R quad muscle strength 1/2 muscle grade to improve pain-free mobility.    Baseline  Decrease B LE muscle strength: hip flexion 4/5 MMT, L quad 4+/5 MMT, R quad 3+/5 MMT, B hamstring 5/5 MMT, B DF/PF 5/5 MMT. B UE AROM WFL and strength grossly 5/5 MMT except R shoulder flexion 4+/5 MMT.    Time  4     Period  Weeks    Status  New    Target Date  06/04/19      PT LONG TERM GOAL #2   Title  Pt. will increase Berg balance test to >40 out of 56 to improve independence with gait/ decrease fall risk.    Baseline  Berg: 22/56 (significant fall risk)    Time  4    Period  Weeks    Status  New    Target Date  06/04/19      PT LONG TERM GOAL #3   Title  Pt. able to ambulate 100 feet with consistent 2 point gait pattern and use of least restrictive assistive devce to improve household mobility.    Baseline  Pt. able to ambulate with use of RW/ CGA to min. A for safety.  Decrease step pattern/ length/ heel strike.    Time  4    Period  Weeks    Status  New    Target Date  06/04/19      PT LONG TERM GOAL #4   Title  Pt. able to stand from normal chair with no UE assist to improve safety/independence with transfers.     Baseline  Unable to stand from standard chair without heavy UE assist.    Time  4    Period  Weeks    Status  New    Target Date  06/04/19      PT LONG TERM GOAL #5   Title  --    Baseline  --    Time  --    Period  --    Status  --          Plan - 05/15/19 1126    Clinical Impression Statement  PT and SPT reassessed pt R hip and knee today d/t pt reporting increased pain. No abnormalities noted with palpation of R hip. Pt had pain with supine PROM of R hip IR/ER and pt was TTP on medial and lateral borders of R knee. Pt with no reports of pain with PROM of R knee. Pt still able to complete therapeutic exercises today but with majority of exercises modifiedin a supine position instead of seated or standing without increased pain. Pt will continue to benefit from further skilled therapy to improve R hip/LE symptoms and increase B LE strength.    Stability/Clinical Decision Making  Evolving/Moderate complexity    Clinical Decision Making  Moderate    Rehab Potential  Good    PT Frequency  2x / week    PT Duration  4 weeks    PT Treatment/Interventions  ADLs/Self  Care Home Management;Therapeutic activities;Functional mobility training;Stair training;Gait training;Therapeutic exercise;Balance training;Neuromuscular re-education;Patient/family education;Manual techniques;Passive range of motion    PT Next Visit Plan  Progress LE strengthening/ gait endurance/ safety; progress gait mechanics and gait exercises    PT Home Exercise Plan  See handouts    Consulted and Agree with Plan of Care  Patient;Family member/caregiver    Family Member Consulted  spouse: [name]       Patient will benefit from skilled therapeutic intervention in order to improve the following deficits and impairments:  Abnormal gait, Decreased balance, Decreased endurance, Decreased mobility, Difficulty walking, Decreased range of motion, Decreased activity tolerance, Decreased strength, Impaired flexibility, Postural dysfunction, Pain  Visit Diagnosis: 1. Muscle weakness (generalized)   2. Gait difficulty   3. Abnormal posture        Problem List Patient Active Problem List   Diagnosis Date Noted  . Lymphedema 04/15/2019  . Hyponatremia 12/24/2018  . SIADH (syndrome of inappropriate ADH production) ([place]) 12/24/2018  . Erosion of urethra due to catheterization of urinary tract ([place]) 10/27/2018  . Generalized weakness 10/14/2018  . Hip fracture ([place]) 09/15/2018  . 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 HLD (hyperlipidemia) 03/24/2018  . Contusion of right knee 02/14/2018  . Bradycardia 12/28/2017  . 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 Chronic pain of right knee 08/10/2016  . Right ankle pain 08/10/2016  . 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  . 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[place]) 12/11/2015  . Urinary retention 09/08/2015  . Lymphoma, 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[place]) 04/03/2015  . Breathlessness on exertion 11/21/2014  . Breath shortness 11/21/2014  . Arthropathy  11/07/2014  . Atrial flutter, paroxysmal ([place]) 11/07/2014  . Type 2 diabetes mellitus ([place]) 11/07/2014  . Benign essential tremor 11/07/2014  . Benign essential HTN 11/07/2014  . Mixed incontinence 11/07/2014  . Hypercholesterolemia without hypertriglyceridemia 11/07/2014  . Apnea, sleep 11/07/2014  . Controlled type 2 diabetes mellitus without complication ([place]) 37/16/9678  . Pure hypercholesterolemia 11/07/2014  . Other abnormalities of gait and mobility 11/02/2011  . Abnormal gait 06/29/2011  . Discoordination 06/29/2011   Pura Spice, PT, DPT # [id number] Ricard Dillon, SPT 05/15/2019, 11:35 AM  [facility] [address] [place], [place], [address] Phone: [phone]   Fax:  [phone]  Name: Mike Holloway MRN: [id number] Date of Birth: [birth year]

## 2019-05-16 ENCOUNTER — Other Ambulatory Visit: Payer: Self-pay

## 2019-05-16 ENCOUNTER — Encounter: Payer: Self-pay | Admitting: Physical Therapy

## 2019-05-16 ENCOUNTER — Ambulatory Visit: Payer: Medicare Other | Admitting: Physical Therapy

## 2019-05-16 DIAGNOSIS — R269 Unspecified abnormalities of gait and mobility: Secondary | ICD-10-CM

## 2019-05-16 DIAGNOSIS — R293 Abnormal posture: Secondary | ICD-10-CM

## 2019-05-16 DIAGNOSIS — M6281 Muscle weakness (generalized): Secondary | ICD-10-CM | POA: Diagnosis not present

## 2019-05-16 NOTE — Therapy (Signed)
[facility] [address] [place], [place], [address] Phone: [phone]   Fax:  [phone]  Physical Therapy Treatment  Patient Details  Name: Mike Holloway MRN: [id number] Date of Birth: [birth year] Referring Provider (PT): Dr. Rudene Christians   Encounter Date: 05/16/2019  PT End of Session - 05/16/19 1506    Visit Number  4    Number of Visits  8    Date for PT Re-Evaluation  06/04/19    Authorization - Visit Number  4    Authorization - Number of Visits  10    PT Start Time  1346    PT Stop Time  1449    PT Time Calculation (min)  63 min    Equipment Utilized During Treatment  Gait belt    Activity Tolerance  Patient tolerated treatment well;Patient limited by pain    Behavior During Therapy  [facility] for tasks assessed/performed       Past Medical History:  Diagnosis Date  . Arthritis   . Atrial flutter ([place])   . Diabetes mellitus ([place])   . Essential tremor   . Essential tremor    deep brain stimulator   . Hypercholesteremia   . Hypertension   . Incontinence   . 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[place])    grew on the testical  . Sleep apnea   . Stroke [facility])     Past Surgical History:  Procedure Laterality Date  . ABLATION    . APPENDECTOMY    . CATARACT EXTRACTION, BILATERAL    . COLONOSCOPY WITH PROPOFOL N/A 03/17/2018   Procedure: COLONOSCOPY WITH PROPOFOL;  Surgeon: Jonathon Bellows, MD;  Location[facility];  Service: Gastroenterology;  Laterality: N/A;  . DEEP BRAIN STIMULATOR PLACEMENT    . 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 HEMORRHOID SURGERY    . HERNIA REPAIR    . HIP FRACTURE SURGERY    . INTRAMEDULLARY (IM) NAIL INTERTROCHANTERIC Right 09/16/2018   Procedure: INTRAMEDULLARY (IM) NAIL INTERTROCHANTRIC;  Surgeon: Hessie Knows, MD;  Location: [facility] ORS;  Service: Orthopedics;  Laterality: Right;   . IR CATHETER TUBE CHANGE  11/22/2018  . NASAL SINUS SURGERY    . ORCHIECTOMY    . TONSILLECTOMY      There were no vitals filed for this visit.  Subjective Assessment - 05/16/19 1504    Subjective  Pt reports R knee pain today as 5/10. Pt reports he has been performing HEP. Pt reports he is going to follow-up with his physician about knee pain and possible injections.    Patient is accompained by:  Family member    Patient Stated Goals  Improve LE strength/ gait and balance with daily tasks.  Improve gait/ safety/ progression to least assistive device.    Currently in Pain?  Yes    Pain Score  5     Pain Location  Knee    Pain Orientation  Right       Therapeutic Exercise:  Seated LAQ - 2x10 B Standing reaches with cone stacking - x several repetitions.  Stand>sit, emphasis on eccentric control 3x.  Standing on foam - 4x60 sec with intermittent UE support and CGA from SPT. Pt improved with repetition. Seated on wobble pad and feet on foam with reaching/trunk rotation for cone stacking - 2x12 Lateral stepping in // bars - 2x. Pt with increased R knee pain, requiring  seated rest break that resolved with SciFit exercise. SciFit L6, 10 min.      PT Education - 05/16/19 1506    Education Details  Pt educated on benefits of activity for pain/joint pain modulation.    Person(s) Educated  Patient    Methods  Demonstration;Explanation    Comprehension  Verbalized understanding;Returned demonstration          PT Long Term Goals - 05/09/19 1142      PT LONG TERM GOAL #1   Title  Pt. independent with HEP to increase B hip flexion/ R quad muscle strength 1/2 muscle grade to improve pain-free mobility.    Baseline  Decrease B LE muscle strength: hip flexion 4/5 MMT, L quad 4+/5 MMT, R quad 3+/5 MMT, B hamstring 5/5 MMT, B DF/PF 5/5 MMT. B UE AROM WFL and strength grossly 5/5 MMT except R shoulder flexion 4+/5 MMT.    Time  4    Period  Weeks    Status  New    Target Date   06/04/19      PT LONG TERM GOAL #2   Title  Pt. will increase Berg balance test to >40 out of 56 to improve independence with gait/ decrease fall risk.    Baseline  Berg: 22/56 (significant fall risk)    Time  4    Period  Weeks    Status  New    Target Date  06/04/19      PT LONG TERM GOAL #3   Title  Pt. able to ambulate 100 feet with consistent 2 point gait pattern and use of least restrictive assistive devce to improve household mobility.    Baseline  Pt. able to ambulate with use of RW/ CGA to min. A for safety.  Decrease step pattern/ length/ heel strike.    Time  4    Period  Weeks    Status  New    Target Date  06/04/19      PT LONG TERM GOAL #4   Title  Pt. able to stand from normal chair with no UE assist to improve safety/independence with transfers.     Baseline  Unable to stand from standard chair without heavy UE assist.    Time  4    Period  Weeks    Status  New    Target Date  06/04/19      PT LONG TERM GOAL #5   Title  --    Baseline  --    Time  --    Period  --    Status  --            Plan - 05/16/19 1513    Clinical Impression Statement  Pt limited with exercises today secondary to reports of continue R knee pain. PT educated pt on benefits of maintaining activity levels throughout day for pain modulation. Pt demonstrated 3 instances of LOB with standing on foam, requiring B UE support on RW and CGA from SPT to regain balance. However, pt with decreased sway/improved postural stability with continuing repetitions of standing on foam. Pt with decreased tolerance for lateral stepping in // bars with reports of R knee pain and required prolonged seated rest break. Afterwards, ptt reported decreased/no R knee pain with SciFit exercise. Pt will continue to benefit from further skilled therapay to improve B LE strength and balance.    Stability/Clinical Decision Making  Evolving/Moderate complexity    Clinical Decision Making  Moderate  Rehab Potential   Good    PT Frequency  2x / week    PT Duration  4 weeks    PT Treatment/Interventions  ADLs/Self Care Home Management;Therapeutic activities;Functional mobility training;Stair training;Gait training;Therapeutic exercise;Balance training;Neuromuscular re-education;Patient/family education;Manual techniques;Passive range of motion    PT Next Visit Plan  Progress LE strengthening/ gait endurance/ safety; progress gait mechanics and gait exercises; progress static balance exercises    PT Home Exercise Plan  See handouts    Consulted and Agree with Plan of Care  Patient;Family member/caregiver    Family Member Consulted  spouse: [name]       Patient will benefit from skilled therapeutic intervention in order to improve the following deficits and impairments:  Abnormal gait, Decreased balance, Decreased endurance, Decreased mobility, Difficulty walking, Decreased range of motion, Decreased activity tolerance, Decreased strength, Impaired flexibility, Postural dysfunction, Pain  Visit Diagnosis: 1. Muscle weakness (generalized)   2. Gait difficulty   3. Abnormal posture        Problem List Patient Active Problem List   Diagnosis Date Noted  . Lymphedema 04/15/2019  . Hyponatremia 12/24/2018  . SIADH (syndrome of inappropriate ADH production) ([place]) 12/24/2018  . Erosion of urethra due to catheterization of urinary tract ([place]) 10/27/2018  . Generalized weakness 10/14/2018  . Hip fracture ([place]) 09/15/2018  . 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 HLD (hyperlipidemia) 03/24/2018  . Contusion of right knee 02/14/2018  . Bradycardia 12/28/2017  . 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 Chronic pain of right knee 08/10/2016  . Right ankle pain 08/10/2016  . 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  . 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[place]) 12/11/2015  . Urinary retention 09/08/2015   . Lymphoma, 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[place]) 04/03/2015  . Breathlessness on exertion 11/21/2014  . Breath shortness 11/21/2014  . Arthropathy 11/07/2014  . Atrial flutter, paroxysmal ([place]) 11/07/2014  . Type 2 diabetes mellitus ([place]) 11/07/2014  . Benign essential tremor 11/07/2014  . Benign essential HTN 11/07/2014  . Mixed incontinence 11/07/2014  . Hypercholesterolemia without hypertriglyceridemia 11/07/2014  . Apnea, sleep 11/07/2014  . Controlled type 2 diabetes mellitus without complication ([place]) 03/49/1791  . Pure hypercholesterolemia 11/07/2014  . Other abnormalities of gait and mobility 11/02/2011  . Abnormal gait 06/29/2011  . Discoordination 06/29/2011   Pura Spice, PT, DPT # [id number] Ricard Dillon, SPT 05/16/2019, 3:18 PM  [facility] [address] [place], [place], [address] Phone: [phone]   Fax:  [phone]  Name: Mike Holloway MRN: [id number] Date of Birth: 1940-11-24

## 2019-05-21 ENCOUNTER — Encounter: Payer: Self-pay | Admitting: Physical Therapy

## 2019-05-21 ENCOUNTER — Other Ambulatory Visit: Payer: Self-pay

## 2019-05-21 ENCOUNTER — Encounter: Payer: Self-pay | Admitting: Student in an Organized Health Care Education/Training Program

## 2019-05-21 ENCOUNTER — Ambulatory Visit: Payer: Medicare Other | Admitting: Physical Therapy

## 2019-05-21 DIAGNOSIS — M6281 Muscle weakness (generalized): Secondary | ICD-10-CM | POA: Diagnosis not present

## 2019-05-21 DIAGNOSIS — R293 Abnormal posture: Secondary | ICD-10-CM

## 2019-05-21 DIAGNOSIS — R269 Unspecified abnormalities of gait and mobility: Secondary | ICD-10-CM

## 2019-05-21 NOTE — Therapy (Signed)
[facility] [address]. [place], [place], [address] Phone: [phone]   Fax:  [phone]  Physical Therapy Treatment  Patient Details  Name: Mike Holloway MRN: [id number] Date of Birth: 08/13/1941 Referring Provider (PT): Dr. Rudene Christians   Encounter Date: 05/21/2019  PT End of Session - 05/21/19 1623    Visit Number  5    Number of Visits  8    Date for PT Re-Evaluation  06/04/19    Authorization - Visit Number  5    Authorization - Number of Visits  10    PT Start Time  1340    PT Stop Time  1446    PT Time Calculation (min)  66 min    Equipment Utilized During Treatment  Gait belt    Activity Tolerance  Patient tolerated treatment well;Patient limited by pain    Behavior During Therapy  [facility] for tasks assessed/performed       Past Medical History:  Diagnosis Date  . Arthritis   . Atrial flutter ([place])   . Diabetes mellitus ([place])   . Essential tremor   . Essential tremor    deep brain stimulator   . Hypercholesteremia   . Hypertension   . Incontinence   . 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[place])    grew on the testical  . Sleep apnea   . Stroke [facility])     Past Surgical History:  Procedure Laterality Date  . ABLATION    . APPENDECTOMY    . CATARACT EXTRACTION, BILATERAL    . COLONOSCOPY WITH PROPOFOL N/A 03/17/2018   Procedure: COLONOSCOPY WITH PROPOFOL;  Surgeon: Jonathon Bellows, MD;  Location: [facility] ENDOSCOPY;  Service: Gastroenterology;  Laterality: N/A;  . DEEP BRAIN STIMULATOR PLACEMENT    . 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 HEMORRHOID SURGERY    . HERNIA REPAIR    . HIP FRACTURE SURGERY    . INTRAMEDULLARY (IM) NAIL INTERTROCHANTERIC Right 09/16/2018   Procedure: INTRAMEDULLARY (IM) NAIL INTERTROCHANTRIC;  Surgeon: Hessie Knows, MD;  Location: [facility] ORS;  Service: Orthopedics;  Laterality: Right;   . IR CATHETER TUBE CHANGE  11/22/2018  . NASAL SINUS SURGERY    . ORCHIECTOMY    . TONSILLECTOMY      There were no vitals filed for this visit.  Subjective Assessment - 05/21/19 1355    Subjective  Pt reports R knee pain today as 5/10.  Pt. has follow-up with his physician about knee pain and possible injections tomorrow.    Patient is accompained by:  Family member    Patient Stated Goals  Improve LE strength/ gait and balance with daily tasks.  Improve gait/ safety/ progression to least assistive device.    Currently in Pain?  Yes    Pain Score  5     Pain Location  Knee    Pain Orientation  Right       Therapeutic exercise: SciFit L6 10 min Obstacle course with uneven surfaces -2x with RW and CGA from SPT Sit<>stands from modified (increased height) with unilateral UE assist - 4x. Cuing provided for technique  Sit<>stand with feet on foam - 4x. Unilateral UE assist. Cuing for technique Standing on foam - 4x30 sec without intermittent UE support Lateral walking in // bars with UE support - 2x each Standing weightshifts with RW as UE support x several repetitions Seated LAQs - 20x  Seated marches - 20x Seated heel raises - 20x  Pt seated, with heat pack applied over pants over R knee for 10 min. Pt with no adverse reactions noted after heat pack removed.     PT Education - 05/21/19 1623    Education Details  Pt educated on technique with standing on foam balance exercise    Person(s) Educated  Patient    Methods  Explanation;Demonstration    Comprehension  Verbalized understanding;Returned demonstration          PT Long Term Goals - 05/09/19 1142      PT LONG TERM GOAL #1   Title  Pt. independent with HEP to increase B hip flexion/ R quad muscle strength 1/2 muscle grade to improve pain-free mobility.    Baseline  Decrease B LE muscle strength: hip flexion 4/5 MMT, L quad 4+/5 MMT, R quad 3+/5 MMT, B hamstring 5/5 MMT, B DF/PF 5/5 MMT. B UE AROM WFL and strength  grossly 5/5 MMT except R shoulder flexion 4+/5 MMT.    Time  4    Period  Weeks    Status  New    Target Date  06/04/19      PT LONG TERM GOAL #2   Title  Pt. will increase Berg balance test to >40 out of 56 to improve independence with gait/ decrease fall risk.    Baseline  Berg: 22/56 (significant fall risk)    Time  4    Period  Weeks    Status  New    Target Date  06/04/19      PT LONG TERM GOAL #3   Title  Pt. able to ambulate 100 feet with consistent 2 point gait pattern and use of least restrictive assistive devce to improve household mobility.    Baseline  Pt. able to ambulate with use of RW/ CGA to min. A for safety.  Decrease step pattern/ length/ heel strike.    Time  4    Period  Weeks    Status  New    Target Date  06/04/19      PT LONG TERM GOAL #4   Title  Pt. able to stand from normal chair with no UE assist to improve safety/independence with transfers.     Baseline  Unable to stand from standard chair without heavy UE assist.    Time  4    Period  Weeks    Status  New    Target Date  06/04/19      PT LONG TERM GOAL #5   Title  --    Baseline  --    Time  --    Period  --    Status  --            Plan - 05/21/19 1624    Clinical Impression Statement  Pt limited with weightbearing exercise today due to continued R knee pain. Pt with decreased lateral step length on L LE with lateral stepping exercise and with gait due to R knee pain. Pt demonstrated increased weightbearing of B UEs (approximately 20-30%) onto RW with gait and was unable shift more weight onto B LEs even with cuing. However, pt still demonstrated progress with improved sit<>stand and standing on foam technique today, without LOB and with decreased reliance on intermittent UE support. Pt reported decreased R knee pain by end of session.  Pt will continue to benefit from further skilled therapy to improve LE strength, balance, and gait mechanics.  Stability/Clinical Decision Making   Evolving/Moderate complexity    Clinical Decision Making  Moderate    Rehab Potential  Good    PT Frequency  2x / week    PT Duration  4 weeks    PT Treatment/Interventions  ADLs/Self Care Home Management;Therapeutic activities;Functional mobility training;Stair training;Gait training;Therapeutic exercise;Balance training;Neuromuscular re-education;Patient/family education;Manual techniques;Passive range of motion    PT Next Visit Plan  Progress LE strengthening/ gait endurance/ safety; progress gait mechanics and gait exercises; progress static balance exercises; progress dynamic balance exercises    PT Home Exercise Plan  See handouts    Consulted and Agree with Plan of Care  Patient;Family member/caregiver    Family Member Consulted  spouse: [name]       Patient will benefit from skilled therapeutic intervention in order to improve the following deficits and impairments:  Abnormal gait, Decreased balance, Decreased endurance, Decreased mobility, Difficulty walking, Decreased range of motion, Decreased activity tolerance, Decreased strength, Impaired flexibility, Postural dysfunction, Pain  Visit Diagnosis: Muscle weakness (generalized)  Gait difficulty  Abnormal posture     Problem List Patient Active Problem List   Diagnosis Date Noted  . Lymphedema 04/15/2019  . Hyponatremia 12/24/2018  . SIADH (syndrome of inappropriate ADH production) ([place]) 12/24/2018  . Erosion of urethra due to catheterization of urinary tract ([place]) 10/27/2018  . Generalized weakness 10/14/2018  . Hip fracture ([place]) 09/15/2018  . 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 HLD (hyperlipidemia) 03/24/2018  . Contusion of right knee 02/14/2018  . Bradycardia 12/28/2017  . 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 Chronic pain of right knee 08/10/2016  . Right ankle pain 08/10/2016  .  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  . 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[place]) 12/11/2015  . Urinary retention 09/08/2015  . Lymphoma, 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[place]) 04/03/2015  . Breathlessness on exertion 11/21/2014  . Breath shortness 11/21/2014  . Arthropathy 11/07/2014  . Atrial flutter, paroxysmal ([place]) 11/07/2014  . Type 2 diabetes mellitus ([place]) 11/07/2014  . Benign essential tremor 11/07/2014  . Benign essential HTN 11/07/2014  . Mixed incontinence 11/07/2014  . Hypercholesterolemia without hypertriglyceridemia 11/07/2014  . Apnea, sleep 11/07/2014  . Controlled type 2 diabetes mellitus without complication ([place]) 0000000  . Pure hypercholesterolemia 11/07/2014  . Other abnormalities of gait and mobility 11/02/2011  . Abnormal gait 06/29/2011  . Discoordination 06/29/2011   Pura Spice, PT, DPT # [id number] Ricard Dillon, SPT 05/22/2019, 9:43 AM  [place] [address] [place], [place], [address] Phone: [phone]   Fax:  [phone]  Name: Myquan Vito MRN: [id number] Date of Birth: 11-03-1940

## 2019-05-22 ENCOUNTER — Ambulatory Visit
Payer: Medicare Other | Attending: Student in an Organized Health Care Education/Training Program | Admitting: Student in an Organized Health Care Education/Training Program

## 2019-05-22 ENCOUNTER — Encounter: Payer: Self-pay | Admitting: Student in an Organized Health Care Education/Training Program

## 2019-05-22 ENCOUNTER — Other Ambulatory Visit: Payer: Self-pay

## 2019-05-22 DIAGNOSIS — M25561 Pain in right knee: Secondary | ICD-10-CM

## 2019-05-22 DIAGNOSIS — M1711 Unilateral primary osteoarthritis, right knee: Secondary | ICD-10-CM | POA: Diagnosis not present

## 2019-05-22 DIAGNOSIS — Z96651 Presence of right artificial knee joint: Secondary | ICD-10-CM

## 2019-05-22 DIAGNOSIS — G8929 Other chronic pain: Secondary | ICD-10-CM | POA: Diagnosis not present

## 2019-05-22 NOTE — Progress Notes (Signed)
Pain Management Virtual Encounter Note - Virtual Visit via [place] (real-time audio visits between healthcare provider and patient).   Patient's Phone No. & Preferred Pharmacy:  [phone] (home); [phone] (mobile); (Preferred) [phone] [email or web address]  [facility] [id number], [place] [address] AT [place] [address] Phone: [phone] Fax: [phone]    Pre-screening note:  Our staff contacted Mr. Blatz and offered him an "in person", "face-to-face" appointment versus a telephone encounter. He indicated preferring the telephone encounter, at this time.   Reason for Virtual Visit: COVID-19*  Social distancing based on CDC and AMA recommendations.   I contacted [name] on 05/22/2019 via video conference.      I clearly identified myself as Gillis Santa, MD. I verified that I was speaking with the correct person using two identifiers (Name: Jordany Warters, and date of birth: [birth year]).  Advanced Informed Consent I sought verbal advanced consent from [name] for virtual visit interactions. I informed Mr. Benshoof of possible security and privacy concerns, risks, and limitations associated with providing "not-in-person" medical evaluation and management services. I also informed Mr. Mcnulty of the availability of "in-person" appointments. Finally, I informed him that there would be a charge for the virtual visit and that he could be  personally, fully or partially, financially responsible for it. Mr. Mcghie expressed understanding and agreed to proceed.   Historic Elements   Mr. Harrison Vitalis is a 78 y.o. year old, male patient evaluated today after his last encounter by our practice on Visit date not found. Mr. Moffit  has a past medical history of Arthritis, Atrial flutter ([place]), Diabetes mellitus ([place]), Essential tremor, Essential tremor, Hypercholesteremia,  Hypertension, Incontinence, 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[place]), Sleep apnea, and Stroke ([place]). 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 Mr. Ahlstrand has a current medication list which includes the following prescription(s): acetaminophen, aspirin ec, azelastine hcl, cholecalciferol, diclofenac sodium, famotidine, fexofenadine, fluoxetine, furosemide, melatonin, mirabegron er, primidone, vsl#3, propranolol er, psyllium, tamsulosin, guaifenesin-dextromethorphan, and UNABLE TO FIND. He  reports that he has never smoked. He has never used smokeless tobacco. He reports that he does not drink alcohol or use drugs. Mr. Eulberg is allergic to clindamycin; flagyl [metronidazole]; ambien  [zolpidem]; penicillins; and sulfa antibiotics.   HPI  Today, he is being contacted for worsening of previously known (established) problem  Patient is requesting right genicular radiofrequency ablation.  Of note patient has a history of right knee total arthroplasty.  He is status post 2+ diagnostic right knee genicular nerve blocks on 07/03/2018 and 08/23/2018.  Unfortunately 1 month after his genicular nerve block, patient sustained a fall and had a right hip ORIF done with Dr. Rudene Christians.  Patient is currently in a wheelchair.  He is attempting to work with PT but is having significant right knee pain.  Risks and benefits of right knee genicular radiofrequency ablation reviewed with patient.  Patient like to proceed.  Laboratory Chemistry Profile (12 mo)  Renal: 11/13/2018: BUN 17; Creatinine, Ser 1.01  Lab Results  Component Value Date   GFRAA >60 11/13/2018    GFRNONAA >60 11/13/2018   Hepatic: 09/15/2018: Albumin 3.9 Lab Results  Component  Value Date   AST 19 09/15/2018   ALT 13 09/15/2018   Other: No results found for requested labs within last 8760 hours. Note: Above Lab results reviewed.   Assessment  The primary encounter diagnosis was Primary osteoarthritis of right knee. Diagnoses of Chronic pain of right knee, Hx of total knee arthroplasty, right, and Status post right unicompartmental knee replacement were also pertinent to this visit.  Plan of Care  I am having Javel Carranco. Parthasarathy maintain his primidone, propranolol ER, diclofenac sodium, tamsulosin, famotidine, aspirin EC, acetaminophen, VSL#3, FLUoxetine, cholecalciferol, psyllium, Melatonin, guaiFENesin-dextromethorphan, UNABLE TO FIND, furosemide, fexofenadine, Azelastine HCl, and mirabegron ER.   Orders:  Orders Placed This Encounter  Procedures  . Radiofrequency,Genicular    Standing Status:   Future    Standing Expiration Date:   11/21/2020    Scheduling Instructions:     Side(s): RIGHT     Level(s): Superior-Lateral, Superior-Medial, and Inferior-Medial Genicular Nerve(s)     Sedation: With Sedation     Scheduling Timeframe: As soon as pre-approved    Order Specific Question:   Where will this procedure be performed?    Answer:   [facility]   Follow-up plan:   Return in about 1 week (around 05/29/2019) for Procedure R Genciular RFA ASAP.      post 2+ diagnostic right knee genicular nerve blocks on 07/03/2018 and 08/23/2018   Recent Visits No visits were found meeting these conditions.  Showing recent visits within past 90 days and meeting all other requirements   Today's Visits Date Type Provider Dept  05/22/19 Office Visit Gillis Santa, MD [facility]  Showing today's visits and meeting all other requirements   Future Appointments No visits were found meeting these conditions.  Showing future appointments within next 90 days and meeting all  other requirements   I discussed the assessment and treatment plan with the patient. The patient was provided an opportunity to ask questions and all were answered. The patient agreed with the plan and demonstrated an understanding of the instructions.  Patient advised to call back or seek an in-person evaluation if the symptoms or condition worsens.  Total duration of non-face-to-face encounter: 56minutes.  Note by: Gillis Santa, MD Date: 05/22/2019; Time: 2:05 PM  Note: This dictation was prepared with Dragon dictation. Any transcriptional errors that may result from this process are unintentional.  Disclaimer:  * Given the special circumstances of the COVID-19 pandemic, the federal government has announced that the Office for Civil Rights (OCR) will exercise its enforcement discretion and will not impose penalties on physicians using telehealth in the event of noncompliance with regulatory requirements under the [place] and [place] (HIPAA) in connection with the good faith provision of telehealth during the XX123456 national public health emergency. ([place])

## 2019-05-23 ENCOUNTER — Ambulatory Visit: Payer: Medicare Other | Admitting: Physical Therapy

## 2019-05-23 ENCOUNTER — Encounter: Payer: Self-pay | Admitting: Physical Therapy

## 2019-05-23 ENCOUNTER — Other Ambulatory Visit: Payer: Self-pay

## 2019-05-23 DIAGNOSIS — M6281 Muscle weakness (generalized): Secondary | ICD-10-CM

## 2019-05-23 DIAGNOSIS — R293 Abnormal posture: Secondary | ICD-10-CM

## 2019-05-23 DIAGNOSIS — R269 Unspecified abnormalities of gait and mobility: Secondary | ICD-10-CM

## 2019-05-23 NOTE — Therapy (Signed)
[facility] [address] [place], [place], [address] Phone: [phone]   Fax:  [phone]  Physical Therapy Treatment  Patient Details  Name: Mike Holloway MRN: [id number] Date of Birth: 1940-10-23 Referring Provider (PT): Dr. Rudene Christians   Encounter Date: 05/23/2019  PT End of Session - 05/23/19 1547    Visit Number  6    Number of Visits  8    Date for PT Re-Evaluation  06/04/19    Authorization - Visit Number  6    Authorization - Number of Visits  10    PT Start Time  [id number]    PT Stop Time  1448    PT Time Calculation (min)  56 min    Equipment Utilized During Treatment  Gait belt    Activity Tolerance  Patient tolerated treatment well;Patient limited by pain    Behavior During Therapy  [facility] for tasks assessed/performed       Past Medical History:  Diagnosis Date  . Arthritis   . Atrial flutter ([place])   . Diabetes mellitus ([place])   . Essential tremor   . Essential tremor    deep brain stimulator   . Hypercholesteremia   . Hypertension   . Incontinence   . 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[place])    grew on the testical  . Sleep apnea   . Stroke [facility])     Past Surgical History:  Procedure Laterality Date  . ABLATION    . APPENDECTOMY    . CATARACT EXTRACTION, BILATERAL    . COLONOSCOPY WITH PROPOFOL N/A 03/17/2018   Procedure: COLONOSCOPY WITH PROPOFOL;  Surgeon: Jonathon Bellows, MD;  Location: [facility] ENDOSCOPY;  Service: Gastroenterology;  Laterality: N/A;  . DEEP BRAIN STIMULATOR PLACEMENT    . 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   Procedure: FECAL TRANSPLANT;  Surgeon: Jonathon Bellows, MD;  Location: [facility] ENDOSCOPY;  Service: Gastroenterology;  Laterality: N/A;  . HEMORRHOID SURGERY    . HERNIA REPAIR    . HIP FRACTURE SURGERY    . INTRAMEDULLARY (IM) NAIL INTERTROCHANTERIC Right 09/16/2018   Procedure: INTRAMEDULLARY (IM) NAIL INTERTROCHANTRIC;  Surgeon: Hessie Knows, MD;  Location: [facility] ORS;  Service: Orthopedics;  Laterality: Right;   . IR CATHETER TUBE CHANGE  11/22/2018  . NASAL SINUS SURGERY    . ORCHIECTOMY    . TONSILLECTOMY      There were no vitals filed for this visit.  Subjective Assessment - 05/23/19 1544    Subjective  Pt reports R knee pain is 3-4/10 today. Pt reports he spoke with physician and will be having RF ablation procedure for R knee pain this coming Monday morning.    Patient is accompained by:  Family member    Patient Stated Goals  Improve LE strength/ gait and balance with daily tasks.  Improve gait/ safety/ progression to least assistive device.    Pain Score  4     Pain Location  Knee    Pain Orientation  Right         Therapeutic exercises:  Ambulation from parking lot through clinic with RW. Cuing for technique/decrease UE weightbearing  Seated LAQ - 2x20 B LEs Seated marching 2x20 no weight, 20x with 3# ankle weights Seated heel raises with and without 3# ankle weights -  20x each Seated toe raises - 20x  Ambulation/marching in // bars forward and backward, B UE support - 4x with prolonged seated rest break after 2nd set. Cuing for technique. Stepping over 3" plinth in //  bars - x multiple repetitions  SciFit, L6 10 min - cool down       PT Education - 05/23/19 1546    Education Details  Pt educated on pain modulation technqiues/benefits of activity for R knee pain    Person(s) Educated  Patient;Spouse    Methods  Explanation;Demonstration    Comprehension  Verbalized understanding          PT Long Term Goals - 05/09/19 1142      PT LONG TERM GOAL #1   Title  Pt. independent with HEP to increase B hip flexion/ R quad muscle strength 1/2 muscle grade to improve pain-free mobility.    Baseline  Decrease B LE muscle strength: hip flexion 4/5 MMT, L quad 4+/5 MMT, R quad 3+/5 MMT, B hamstring 5/5 MMT, B DF/PF 5/5 MMT. B UE AROM WFL and strength grossly 5/5 MMT except R shoulder flexion 4+/5 MMT.    Time  4    Period  Weeks    Status  New    Target Date  06/04/19       PT LONG TERM GOAL #2   Title  Pt. will increase Berg balance test to >40 out of 56 to improve independence with gait/ decrease fall risk.    Baseline  Berg: 22/56 (significant fall risk)    Time  4    Period  Weeks    Status  New    Target Date  06/04/19      PT LONG TERM GOAL #3   Title  Pt. able to ambulate 100 feet with consistent 2 point gait pattern and use of least restrictive assistive devce to improve household mobility.    Baseline  Pt. able to ambulate with use of RW/ CGA to min. A for safety.  Decrease step pattern/ length/ heel strike.    Time  4    Period  Weeks    Status  New    Target Date  06/04/19      PT LONG TERM GOAL #4   Title  Pt. able to stand from normal chair with no UE assist to improve safety/independence with transfers.     Baseline  Unable to stand from standard chair without heavy UE assist.    Time  4    Period  Weeks    Status  New    Target Date  06/04/19      PT LONG TERM GOAL #5   Title  --    Baseline  --    Time  --    Period  --    Status  --            Plan - 05/23/19 1556    Clinical Impression Statement  Pt continues to be limited with exercises secondary to R knee pain and difficulty weightbearing on R LE due to pain. Pt cued frequently throughout session to shift more weight from B UEs to B LEs with standing exercises and gait, but pt had difficulty correcting. Pt also ambulates with kyphotic/forward head posture, decreased gait speed and step length B. However, pt reported improvement in R knee sx with seated, open chain exercises, such as LAQ. Pt difficulty increasing hip flexion with marching in // bars, with L hip flexion<R, indicating decreased strength of B hip flexors. Pt will continue to benefit from further skilled therapy to increase gait mechanics and B LE strength.    Stability/Clinical Decision Making  Evolving/Moderate complexity    Clinical Decision Making  Moderate    Rehab Potential  Good    PT Frequency  2x /  week    PT Duration  4 weeks    PT Treatment/Interventions  ADLs/Self Care Home Management;Therapeutic activities;Functional mobility training;Stair training;Gait training;Therapeutic exercise;Balance training;Neuromuscular re-education;Patient/family education;Manual techniques;Passive range of motion    PT Next Visit Plan  Progress LE strengthening/ gait endurance/ safety; progress gait mechanics and gait exercises; progress static balance exercises; progress dynamic balance exercises    PT Home Exercise Plan  See handouts    Consulted and Agree with Plan of Care  Patient;Family member/caregiver    Family Member Consulted  spouse: [name]       Patient will benefit from skilled therapeutic intervention in order to improve the following deficits and impairments:  Abnormal gait, Decreased balance, Decreased endurance, Decreased mobility, Difficulty walking, Decreased range of motion, Decreased activity tolerance, Decreased strength, Impaired flexibility, Postural dysfunction, Pain  Visit Diagnosis: Muscle weakness (generalized)  Gait difficulty  Abnormal posture     Problem List Patient Active Problem List   Diagnosis Date Noted  . Lymphedema 04/15/2019  . Hyponatremia 12/24/2018  . SIADH (syndrome of inappropriate ADH production) ([place]) 12/24/2018  . Erosion of urethra due to catheterization of urinary tract ([place]) 10/27/2018  . Generalized weakness 10/14/2018  . Hip fracture ([place]) 09/15/2018  . 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 HLD (hyperlipidemia) 03/24/2018  . Contusion of right knee 02/14/2018  . Bradycardia 12/28/2017  . 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 Chronic pain of right knee 08/10/2016  . Right ankle pain 08/10/2016  . 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  . 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[place]) 12/11/2015  . Urinary retention 09/08/2015  .  Lymphoma, 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[place]) 04/03/2015  . Breathlessness on exertion 11/21/2014  . Breath shortness 11/21/2014  . Arthropathy 11/07/2014  . Atrial flutter, paroxysmal ([place]) 11/07/2014  . Type 2 diabetes mellitus ([place]) 11/07/2014  . Benign essential tremor 11/07/2014  . Benign essential HTN 11/07/2014  . Mixed incontinence 11/07/2014  . Hypercholesterolemia without hypertriglyceridemia 11/07/2014  . Apnea, sleep 11/07/2014  . Controlled type 2 diabetes mellitus without complication ([place]) 0000000  . Pure hypercholesterolemia 11/07/2014  . Other abnormalities of gait and mobility 11/02/2011  . Abnormal gait 06/29/2011  . Discoordination 06/29/2011   Pura Spice, PT, DPT # [id number] Ricard Dillon, SPT 05/23/2019, 3:57 PM  [facility] [address] [place], [place], [address] Phone: [phone]   Fax:  [phone]  Name: Mike Holloway MRN: [id number] Date of Birth: 08-Apr-1941

## 2019-05-28 ENCOUNTER — Ambulatory Visit (HOSPITAL_BASED_OUTPATIENT_CLINIC_OR_DEPARTMENT_OTHER): Payer: Medicare Other | Admitting: Student in an Organized Health Care Education/Training Program

## 2019-05-28 ENCOUNTER — Encounter: Payer: Medicare Other | Admitting: Physical Therapy

## 2019-05-28 ENCOUNTER — Encounter: Payer: Self-pay | Admitting: Student in an Organized Health Care Education/Training Program

## 2019-05-28 ENCOUNTER — Other Ambulatory Visit: Payer: Self-pay

## 2019-05-28 ENCOUNTER — Ambulatory Visit
Admission: RE | Admit: 2019-05-28 | Discharge: 2019-05-28 | Disposition: A | Discharge status: Home / Self Care | Payer: Medicare Other | Source: Ambulatory Visit | Attending: Student in an Organized Health Care Education/Training Program | Admitting: Student in an Organized Health Care Education/Training Program

## 2019-05-28 VITALS — BP 147/67 | HR 61 | Temp 98.0°F | Resp 16 | Ht 68.75 in | Wt 201.0 lb

## 2019-05-28 DIAGNOSIS — G8929 Other chronic pain: Secondary | ICD-10-CM | POA: Diagnosis present

## 2019-05-28 DIAGNOSIS — M25561 Pain in right knee: Secondary | ICD-10-CM | POA: Diagnosis present

## 2019-05-28 DIAGNOSIS — M1711 Unilateral primary osteoarthritis, right knee: Secondary | ICD-10-CM | POA: Diagnosis present

## 2019-05-28 MED ORDER — LIDOCAINE HCL 2 % IJ SOLN
20.0000 mL | Freq: Once | INTRAMUSCULAR | Status: AC
  Administered 2019-05-28: 400 mg

## 2019-05-28 MED ORDER — DEXAMETHASONE SODIUM PHOSPHATE 10 MG/ML IJ SOLN
10.0000 mg | Freq: Once | INTRAMUSCULAR | Status: AC
  Administered 2019-05-28: 12:00:00 10 mg

## 2019-05-28 MED ORDER — ROPIVACAINE HCL 2 MG/ML IJ SOLN
INTRAMUSCULAR | Status: AC
  Filled 2019-05-28: qty 10

## 2019-05-28 MED ORDER — SODIUM CHLORIDE 0.9% FLUSH: 1.0000 mL | Freq: Once | INTRAVENOUS | Status: DC

## 2019-05-28 MED ORDER — LIDOCAINE HCL 2 % IJ SOLN
INTRAMUSCULAR | Status: AC
  Filled 2019-05-28: qty 20

## 2019-05-28 MED ORDER — FENTANYL CITRATE (PF) 100 MCG/2ML IJ SOLN
INTRAMUSCULAR | Status: AC
  Filled 2019-05-28: qty 2

## 2019-05-28 MED ORDER — DEXAMETHASONE SODIUM PHOSPHATE 10 MG/ML IJ SOLN
INTRAMUSCULAR | Status: AC
  Filled 2019-05-28: qty 1

## 2019-05-28 MED ORDER — FENTANYL CITRATE (PF) 100 MCG/2ML IJ SOLN
25.0000 ug | INTRAMUSCULAR | Status: DC | PRN
  Administered 2019-05-28: 11:00:00 50 ug via INTRAVENOUS

## 2019-05-28 MED ORDER — ROPIVACAINE HCL 2 MG/ML IJ SOLN
1.0000 mL | Freq: Once | INTRAMUSCULAR | Status: AC
  Administered 2019-05-28: 1 mL via EPIDURAL

## 2019-05-28 NOTE — Progress Notes (Signed)
Patient's Name: Mike Holloway  MRN: [id number]  Referring Provider: [name], MD  DOB: 04/11/1941  PCP: [name], MD  DOS: 05/28/2019  Note by: Gillis Santa, MD  Service setting: Ambulatory outpatient  Specialty: Interventional Pain Management  Patient type: Established  Location: [facility] (AMB) Pain Management Facility  Visit type: Interventional Procedure   Primary Reason for Visit: Interventional Pain Management Treatment. CC: Knee Pain (right )  Procedure:          Anesthesia, Analgesia, Anxiolysis:  Type: Therapeutic Superior-lateral, Superior-medial, and Inferior-medial, Genicular Nerve Radiofrequency Ablation.  #1  Region: Lateral, Anterior, and Medial aspects of the knee joint, above and below the knee joint proper. Level: Superior and inferior to the knee joint. Laterality: Right  Type: Moderate (Conscious) Sedation combined with Local Anesthesia Indication(s): Analgesia and Anxiety Route: Intravenous (IV) IV Access: Secured Sedation: Meaningful verbal contact was maintained at all times during the procedure  Local Anesthetic: Lidocaine 1-2%  Position: Supine   Indications: 1. Primary osteoarthritis of right knee   2. Chronic pain of right knee    Mike Holloway has been dealing with the above chronic pain for longer than three months and has either failed to respond, was unable to tolerate, or simply did not get enough benefit from other more conservative therapies including, but not limited to: 1. Over-the-counter medications 2. Anti-inflammatory medications 3. Muscle relaxants 4. Membrane stabilizers 5. Opioids 6. Physical therapy and/or chiropractic manipulation 7. Modalities (Heat, ice, etc.) 8. Invasive techniques such as nerve blocks. Mike Holloway has attained more than 50% relief of the pain from a series of diagnostic injections conducted in separate occasions.  Pain Score: Pre-procedure: 1 /10 Post-procedure: 0-No pain/10  Pre-op Assessment:   Mike Holloway is a 78 y.o. (year old), male patient, seen today for interventional treatment. 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 Mike Holloway has a current medication list which includes the following prescription(s): acetaminophen, aspirin ec, azelastine hcl, cholecalciferol, diclofenac sodium, famotidine, fexofenadine, fluoxetine, furosemide, guaifenesin-dextromethorphan, melatonin, mirabegron er, primidone, vsl#3, propranolol er, psyllium, tamsulosin, and UNABLE TO FIND, and the following Facility-Administered Medications: fentanyl and sodium chloride flush. His primarily concern today is the Knee Pain (right )  Initial Vital Signs:  Pulse/HCG Rate: 61ECG Heart Rate: (!) 58 Temp: 98 F (36.7 C) Resp: 16 BP: (!) 153/82 SpO2: 98 %  BMI: Estimated body mass index is 29.9 kg/m as calculated from the following:   Height as of this encounter: 5' 8.75" (1.746 m).   Weight as of this encounter: 201 lb (91.2 kg).  Risk Assessment: Allergies: Reviewed. He is allergic to clindamycin; flagyl [metronidazole]; ambien  [zolpidem]; penicillins; and sulfa antibiotics.  Allergy Precautions: None required Coagulopathies: Reviewed. None identified.  Blood-thinner therapy: None at this time Active Infection(s): Reviewed. None identified. Mike Holloway is afebrile  Site Confirmation: Mike Holloway was asked to confirm the procedure and laterality before marking the site Procedure checklist: Completed Consent: Before the procedure and under the influence of no sedative(s), amnesic(s), or anxiolytics, the patient was informed of the  treatment options, risks and possible complications. To fulfill our ethical and legal obligations, as recommended by the American Medical Association's Code of Ethics, I have informed the patient of my clinical impression; the  nature and purpose of the treatment or procedure; the risks, benefits, and possible complications of the intervention; the alternatives, including doing nothing; the risk(s) and benefit(s) of the alternative treatment(s) or procedure(s); and the risk(s) and benefit(s) of doing nothing. The patient was provided information about the general risks and possible complications associated with the procedure. These may include, but are not limited to: failure to achieve desired goals, infection, bleeding, organ or nerve damage, allergic reactions, paralysis, and death. In addition, the patient was informed of those risks and complications associated to the procedure, such as failure to decrease pain; infection; bleeding; organ or nerve damage with subsequent damage to sensory, motor, and/or autonomic systems, resulting in permanent pain, numbness, and/or weakness of one or several areas of the body; allergic reactions; (i.e.: anaphylactic reaction); and/or death. Furthermore, the patient was informed of those risks and complications associated with the medications. These include, but are not limited to: allergic reactions (i.e.: anaphylactic or anaphylactoid reaction(s)); adrenal axis suppression; blood sugar elevation that in diabetics may result in ketoacidosis or comma; water retention that in patients with history of congestive heart failure may result in shortness of breath, pulmonary edema, and decompensation with resultant heart failure; weight gain; swelling or edema; medication-induced neural toxicity; particulate matter embolism and blood vessel occlusion with resultant organ, and/or nervous system infarction; and/or aseptic necrosis of one or more joints. Finally, the patient was  informed that Medicine is not an exact science; therefore, there is also the possibility of unforeseen or unpredictable risks and/or possible complications that may result in a catastrophic outcome. The patient indicated having understood very clearly. We have given the patient no guarantees and we have made no promises. Enough time was given to the patient to ask questions, all of which were answered to the patient's satisfaction. Mr. Coulibaly has indicated that he wanted to continue with the procedure. Attestation: I, the ordering provider, attest that I have discussed with the patient the benefits, risks, side-effects, alternatives, likelihood of achieving goals, and potential problems during recovery for the procedure that I have provided informed consent. Date  Time: 05/28/2019 10:44 AM  Pre-Procedure Preparation:  Monitoring: As per clinic protocol. Respiration, ETCO2, SpO2, BP, heart rate and rhythm monitor placed and checked for adequate function Safety Precautions: Patient was assessed for positional comfort and pressure points before starting the procedure. Time-out: I initiated and conducted the "Time-out" before starting the procedure, as per protocol. The patient was asked to participate by confirming the accuracy of the "Time Out" information. Verification of the correct person, site, and procedure were performed and confirmed by me, the nursing staff, and the patient. "Time-out" conducted as per Joint Commission's Universal Protocol (UP.01.01.01). Time: 1133  Description of Procedure:          Target Area: For Genicular Nerve block(s), the targets are: the superior-lateral genicular nerve, located in the lateral distal portion of the femoral shaft as it curves to form the lateral epicondyle, in the region of the distal femoral metaphysis; the superior-medial genicular nerve, located in the medial distal portion of the femoral shaft as it curves to form the medial epicondyle; and the  inferior-medial genicular nerve, located in the medial, proximal portion of the tibial shaft, as it curves to form the medial epicondyle, in the region of the proximal tibial metaphysis. Approach: Anterior, ipsilateral approach. Area Prepped: Entire knee area, from mid-thigh to mid-shin, lateral, anterior, and medial aspects. Prepping solution: DuraPrep (Iodine Povacrylex [0.7% available iodine] and Isopropyl Alcohol, 74% w/w) Safety  Precautions: Aspiration looking for blood return was conducted prior to all injections. At no point did we inject any substances, as a needle was being advanced. No attempts were made at seeking any paresthesias. Safe injection practices and needle disposal techniques used. Medications properly checked for expiration dates. SDV (single dose vial) medications used. Description of the Procedure: Protocol guidelines were followed. The patient was placed in position over the procedure table. The target area was identified and the area prepped in the usual manner. The skin and muscle were infiltrated with local anesthetic. Appropriate amount of time allowed to pass for local anesthetics to take effect. Radiofrequency needles were introduced to the target area using fluoroscopic guidance. Using the NeuroTherm NT1100 Radiofrequency Generator, sensory stimulation using 50 Hz was used to locate & identify the nerve, making sure that the needle was positioned such that there was no sensory stimulation below 0.3 V or above 0.7 V. Stimulation using 2 Hz was used to evaluate the motor component. Care was taken not to lesion any nerves that demonstrated motor stimulation of the lower extremities at an output of less than 2.5 times that of the sensory threshold, or a maximum of 2.0 V. Once satisfactory placement of the needles was achieved, the numbing solution was slowly injected after negative aspiration. After waiting for at least 2 minutes, the ablation was performed at 80 degrees C for 60  seconds, using regular Radiofrequency settings. Once the procedure was completed, the needles were then removed and the area cleansed, making sure to leave some of the prepping solution back to take advantage of its long term bactericidal properties. Intra-operative Compliance: Compliant Vitals:   05/28/19 1200 05/28/19 1212 05/28/19 1217 05/28/19 1227  BP: (!) 149/72 138/70 (!) 143/71 (!) 147/67  Pulse:      Resp: 13 16 16 16   Temp: 98 F (36.7 C)     TempSrc:      SpO2: 96% 97% 96% 100%  Weight:      Height:        Start Time: 1133 hrs. End Time: 1155 hrs. Materials & Medications:  Needle(s) Type: Teflon-coated, curved tip, Radiofrequency needle(s) Gauge: 22G Length: 10cm Medication(s): Please see orders for medications and dosing details. 5 cc solution made of 4 cc of 0.2% ropivacaine, 1 cc of Decadron 10 mg/cc.  1.5 cc injected at each level above after sensorimotor testing. Total steroid dose equals 10 mg of Decadron Imaging Guidance (Non-Spinal):          Type of Imaging Technique: Fluoroscopy Guidance (Non-Spinal) Indication(s): Assistance in needle guidance and placement for procedures requiring needle placement in or near specific anatomical locations not easily accessible without such assistance. Exposure Time: Please see nurses notes. Contrast: Before injecting any contrast, we confirmed that the patient did not have an allergy to iodine, shellfish, or radiological contrast. Once satisfactory needle placement was completed at the desired level, radiological contrast was injected. Contrast injected under live fluoroscopy. No contrast complications. See chart for type and volume of contrast used. Fluoroscopic Guidance: I was personally present during the use of fluoroscopy. "Tunnel Vision Technique" used to obtain the best possible view of the target area. Parallax error corrected before commencing the procedure. "Direction-depth-direction" technique used to introduce the needle  under continuous pulsed fluoroscopy. Once target was reached, antero-posterior, oblique, and lateral fluoroscopic projection used confirm needle placement in all planes. Images permanently stored in EMR. Interpretation: I personally interpreted the imaging intraoperatively. Adequate needle placement confirmed in multiple planes. Appropriate spread of contrast  into desired area was observed. No evidence of afferent or efferent intravascular uptake. Permanent images saved into the patient's record.  Antibiotic Prophylaxis:   Anti-infectives (From admission, onward)   None     Indication(s): None identified  Post-operative Assessment:  Post-procedure Vital Signs:  Pulse/HCG Rate: 6160 Temp: 98 F (36.7 C) Resp: 16 BP: (!) 147/67 SpO2: 100 %  EBL: None  Complications: No immediate post-treatment complications observed by team, or reported by patient.  Note: The patient tolerated the entire procedure well. A repeat set of vitals were taken after the procedure and the patient was kept under observation following institutional policy, for this type of procedure. Post-procedural neurological assessment was performed, showing return to baseline, prior to discharge. The patient was provided with post-procedure discharge instructions, including a section on how to identify potential problems. Should any problems arise concerning this procedure, the patient was given instructions to immediately contact us, at any time, without hesitation. In any case, we plan to contact the patient by telephone for a follow-up status report regarding this interventional procedure.  Comments:  No additional relevant information.  Plan of Care  Orders:  Orders Placed This Encounter  Procedures  . DG PA[facility] C-ARM 1-60 MIN NO REPORT    Intraoperative interpretation by procedural physician at [place].    Standing Status:   Standing    Number of Occurrences:   1    Order Specific Question:    Reason for exam:    Answer:   Assistance in needle guidance and placement for procedures requiring needle placement in or near specific anatomical locations not easily accessible without such assistance.   Medications ordered for procedure: Meds ordered this encounter  Medications  . lidocaine (XYLOCAINE) 2 % (with pres) injection 400 mg  . fentaNYL (SUBLIMAZE) injection 25-50 mcg    Make sure Narcan is available in the pyxis when using this medication. In the event of respiratory depression (RR< 8/min): Titrate NARCAN (naloxone) in increments of 0.1 to 0.2 mg IV at 2-3 minute intervals, until desired degree of reversal.  . ropivacaine (PF) 2 mg/mL (0.2%) (NAROPIN) injection 1 mL  . dexamethasone (DECADRON) injection 10 mg  . sodium chloride flush (NS) 0.9 % injection 1 mL   Medications administered: We administered lidocaine, fentaNYL, ropivacaine (PF) 2 mg/mL (0.2%), and dexamethasone.  See the medical record for exact dosing, route, and time of administration.  Follow-up plan:   Return in about 6 weeks (around 07/09/2019) for Post Procedure Evaluation, virtual.      Status post 2+ diagnostic right knee genicular nerve blocks on 07/03/2018 and 08/23/2018, s/p R GN RFA on 05/28/2019    Recent Visits Date Type Provider Dept  05/22/19 Office Visit Gillis Santa, MD [facility]  Showing recent visits within past 90 days and meeting all other requirements   Today's Visits Date Type Provider Dept  05/28/19 Procedure visit Gillis Santa, MD [facility]  Showing today's visits and meeting all other requirements   Future Appointments Date Type Provider Dept  07/09/19 Appointment Gillis Santa, MD [facility]  Showing future appointments within next 90 days and meeting all other requirements   Disposition: Discharge home  Discharge Date & Time: 05/28/2019; 1231 hrs.   Primary Care Physician: [name], MD Location: [facility]  Facility Note by: Gillis Santa, MD Date: 05/28/2019; Time: 1:03 PM  Disclaimer:  Medicine is not an exact science. The only guarantee in medicine is that nothing is  guaranteed. It is important to note that the decision to proceed with this intervention was based on the information collected from the patient. The Data and conclusions were drawn from the patient's questionnaire, the interview, and the physical examination. Because the information was provided in large part by the patient, it cannot be guaranteed that it has not been purposely or unconsciously manipulated. Every effort has been made to obtain as much relevant data as possible for this evaluation. It is important to note that the conclusions that lead to this procedure are derived in large part from the available data. Always take into account that the treatment will also be dependent on availability of resources and existing treatment guidelines, considered by other Pain Management Practitioners as being common knowledge and practice, at the time of the intervention. For Medico-Legal purposes, it is also important to point out that variation in procedural techniques and pharmacological choices are the acceptable norm. The indications, contraindications, technique, and results of the above procedure should only be interpreted and judged by a Board-Certified Interventional Pain Specialist with extensive familiarity and expertise in the same exact procedure and technique.

## 2019-05-28 NOTE — Patient Instructions (Signed)

## 2019-05-28 NOTE — Progress Notes (Signed)
Safety precautions to be maintained throughout the outpatient stay will include: orient to surroundings, keep bed in low position, maintain call bell within reach at all times, provide assistance with transfer out of bed and ambulation.  

## 2019-05-29 ENCOUNTER — Telehealth: Payer: Self-pay | Admitting: Student in an Organized Health Care Education/Training Program

## 2019-05-29 ENCOUNTER — Telehealth: Payer: Self-pay

## 2019-05-29 NOTE — Telephone Encounter (Signed)
Called patient and discused use of heat.  No further questions

## 2019-05-29 NOTE — Telephone Encounter (Signed)
Patients wife called and would like to know how often and for how long patient needs to apply heat. He had RF on  Monday. Please call asap.

## 2019-05-29 NOTE — Telephone Encounter (Signed)
Post procedure phone call.  Patients wife states he is doing well.

## 2019-05-30 ENCOUNTER — Encounter: Payer: Medicare Other | Admitting: Physical Therapy

## 2019-06-06 ENCOUNTER — Other Ambulatory Visit: Payer: Self-pay

## 2019-06-06 ENCOUNTER — Encounter: Payer: Self-pay | Admitting: Physical Therapy

## 2019-06-06 ENCOUNTER — Ambulatory Visit: Payer: Medicare Other | Attending: Orthopedic Surgery | Admitting: Physical Therapy

## 2019-06-06 DIAGNOSIS — M6281 Muscle weakness (generalized): Secondary | ICD-10-CM | POA: Diagnosis not present

## 2019-06-06 DIAGNOSIS — R269 Unspecified abnormalities of gait and mobility: Secondary | ICD-10-CM

## 2019-06-06 DIAGNOSIS — R293 Abnormal posture: Secondary | ICD-10-CM | POA: Diagnosis present

## 2019-06-08 ENCOUNTER — Ambulatory Visit: Payer: Medicare Other | Admitting: Physical Therapy

## 2019-06-08 ENCOUNTER — Other Ambulatory Visit: Payer: Self-pay

## 2019-06-08 DIAGNOSIS — M6281 Muscle weakness (generalized): Secondary | ICD-10-CM

## 2019-06-08 DIAGNOSIS — R293 Abnormal posture: Secondary | ICD-10-CM

## 2019-06-08 DIAGNOSIS — R269 Unspecified abnormalities of gait and mobility: Secondary | ICD-10-CM

## 2019-06-08 NOTE — Therapy (Addendum)
[facility] [address]. [place], [place], [address] Phone: [phone]   Fax:  [phone]  Physical Therapy Treatment  Patient Details  Name: Mike Holloway MRN: [id number] Date of Birth: 01-07-1941 Referring Provider (PT): Dr. Rudene Christians   Encounter Date: 06/08/2019  PT End of Session - 06/10/19 1416    Visit Number  8    Number of Visits  15    Date for PT Re-Evaluation  07/04/19    Authorization - Visit Number  2    Authorization - Number of Visits  10    PT Start Time  2820    PT Stop Time  1215    PT Time Calculation (min)  59 min    Equipment Utilized During Treatment  Gait belt    Activity Tolerance  Patient tolerated treatment well;Patient limited by pain    Behavior During Therapy  [facility] for tasks assessed/performed       Past Medical History:  Diagnosis Date  . Arthritis   . Atrial flutter ([place])   . Diabetes mellitus ([place])   . Essential tremor   . Essential tremor    deep brain stimulator   . Hypercholesteremia   . Hypertension   . Incontinence   . 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  . Sleep apnea   . Stroke [facility])     Past Surgical History:  Procedure Laterality Date  . ABLATION    . APPENDECTOMY    . CATARACT EXTRACTION, BILATERAL    . COLONOSCOPY WITH PROPOFOL N/A 03/17/2018   Procedure: COLONOSCOPY WITH PROPOFOL;  Surgeon: Jonathon Bellows, MD;  Location[facility] ENDOSCOPY;  Service: Gastroenterology;  Laterality: N/A;  . DEEP BRAIN STIMULATOR PLACEMENT    . 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 HEMORRHOID SURGERY    . HERNIA REPAIR    . HIP FRACTURE SURGERY    . INTRAMEDULLARY (IM) NAIL INTERTROCHANTERIC Right 09/16/2018   Procedure: INTRAMEDULLARY (IM) NAIL INTERTROCHANTRIC;  Surgeon: Hessie Knows, MD;  Location: [facility] ORS;  Service: Orthopedics;  Laterality: Right;   . IR CATHETER TUBE CHANGE  11/22/2018  . NASAL SINUS SURGERY    . ORCHIECTOMY    . TONSILLECTOMY      There were no vitals filed for this visit.  Subjective Assessment - 06/10/19 1413    Subjective  R knee pain continues to persist.  Pt. c/o 3/10 R knee pain prior to PT tx. session.  Pt. entered PT with use of RW and SBA/mod. I.   Pts. wife discussed her concern with pts. limited ability to dress LE/ toileting and walk with less assistance.  PT discussed pts. R knee pain/ tremors in hands with grasping/ reaching tasks.  Pt. continues to wear B LE support stockings.    Patient Stated Goals  Improve LE strength/ gait and balance with daily tasks.  Improve gait/ safety/ progression to least assistive device.    Currently in Pain?  Yes    Pain Score  3     Pain Location  Knee    Pain Orientation  Right    Pain Descriptors / Indicators  Aching;Constant         There.ex.:  Nustep level 4 x10 min B LE only (no increase c/o knee pain) Standing marching in // bars - 6x Lateral stepping in // bars - x Seated heel  raises with 3# ankle weights - 20x Seated marching with 3# ankle weights - 20x B Seated hip adduction with ball - 10x5 second hold Seated hip abduction with RTB  Seated hip abduction against manual resistance Ambulates from PT gym in parking lot with use of RW and cuing to increase step length/ pattern  Decreased hip flexion and abduction R<L.      PT Long Term Goals - 06/10/19 1346      PT LONG TERM GOAL #1   Title  Pt. independent with HEP to increase B hip flexion/ R quad muscle strength 1/2 muscle grade to improve pain-free mobility.    Baseline  Decrease B LE muscle strength: hip flexion 4/5 MMT, L quad 4+/5 MMT, R quad 4/5 MMT, B hamstring 5/5 MMT, B DF/PF 5/5 MMT. B UE AROM WFL and strength grossly 5/5 MMT except R shoulder flexion 4+/5 MMT.    Time  4    Period  Weeks    Status  Partially Met    Target Date  07/04/19      PT LONG TERM GOAL #2   Title  Pt. will  increase Berg balance test to >40 out of 56 to improve independence with gait/ decrease fall risk.    Baseline  Berg: 22/56 (significant fall risk)    Time  4    Period  Weeks    Status  On-going    Target Date  07/04/19      PT LONG TERM GOAL #3   Title  Pt. able to ambulate 100 feet with consistent 2 point gait pattern and use of least restrictive assistive devce to improve household mobility.    Baseline  Pt. able to ambulate with use of RW/ CGA to min. A for safety.   Heavy use of B UE.    Time  4    Period  Weeks    Status  Partially Met    Target Date  07/04/19      PT LONG TERM GOAL #4   Title  Pt. able to stand from normal chair with no UE assist to improve safety/independence with transfers.     Baseline  Unable to stand from standard chair without heavy UE assist.    Time  4    Period  Weeks    Status  Not Met    Target Date  07/04/19            Plan - 06/10/19 1416    Clinical Impression Statement  Pt. unable to ambulate in //-bars with L UE assist during R stance/ L swing through phase of gait.  Pain limited with R LE wt. bearing/ balance tasks.  Several short seated rest breaks during ther.ex. to relieve R knee pain.  No change to HEP at this time.    Stability/Clinical Decision Making  Evolving/Moderate complexity    Clinical Decision Making  Moderate    Rehab Potential  Good    PT Frequency  2x / week    PT Duration  4 weeks    PT Treatment/Interventions  ADLs/Self Care Home Management;Therapeutic activities;Functional mobility training;Stair training;Gait training;Therapeutic exercise;Balance training;Neuromuscular re-education;Patient/family education;Manual techniques;Passive range of motion    PT Next Visit Plan  Progress LE strengthening/ gait endurance/ safety; progress gait mechanics and gait exercises; progress static balance exercises; progress dynamic balance exercises    PT Home Exercise Plan  See handouts    Consulted and Agree with Plan of Care   Patient;Family member/caregiver  Family Member Consulted  spouse: [name]       Patient will benefit from skilled therapeutic intervention in order to improve the following deficits and impairments:  Abnormal gait, Decreased balance, Decreased endurance, Decreased mobility, Difficulty walking, Decreased range of motion, Decreased activity tolerance, Decreased strength, Impaired flexibility, Postural dysfunction, Pain  Visit Diagnosis: Muscle weakness (generalized)  Gait difficulty  Abnormal posture     Problem List Patient Active Problem List   Diagnosis Date Noted  . Lymphedema 04/15/2019  . Hyponatremia 12/24/2018  . SIADH (syndrome of inappropriate ADH production) ([place]) 12/24/2018  . Erosion of urethra due to catheterization of urinary tract ([place]) 10/27/2018  . Generalized weakness 10/14/2018  . Hip fracture ([place]) 09/15/2018  . 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 HLD (hyperlipidemia) 03/24/2018  . Contusion of right knee 02/14/2018  . Bradycardia 12/28/2017  . 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 Chronic pain of right knee 08/10/2016  . Right ankle pain 08/10/2016  . 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  . 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[place]) 12/11/2015  . Urinary retention 09/08/2015  . Lymphoma, 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[place]) 04/03/2015  . Breathlessness on exertion 11/21/2014  . Breath shortness 11/21/2014  . Arthropathy 11/07/2014  . Atrial flutter, paroxysmal ([place]) 11/07/2014  . Type 2 diabetes mellitus ([place]) 11/07/2014  . Benign essential tremor 11/07/2014  . Benign essential HTN 11/07/2014  . Mixed incontinence 11/07/2014  . Hypercholesterolemia without hypertriglyceridemia 11/07/2014  . Apnea, sleep 11/07/2014  . Controlled type 2 diabetes mellitus without complication ([place]) 79/98/7215  . Pure hypercholesterolemia 11/07/2014  . Other  abnormalities of gait and mobility 11/02/2011  . Abnormal gait 06/29/2011  . Discoordination 06/29/2011   Pura Spice, PT, DPT # [phone] 06/10/2019, 2:29 PM  [facility] [address] [place], [place], [address] Phone: [phone]   Fax:  [phone]  Name: Mike Holloway MRN: [id number] Date of Birth: 1941-03-06

## 2019-06-09 NOTE — Progress Notes (Signed)
06/11/2019 4:27 PM   Mike Holloway 10/30/1940 [id number]  Referring provider: [name], MD [place],  [place] [address]  Chief Complaint  Patient presents with  . Urinary Retention    HPI: Mr. Mcvicker is a 78 year old male with a history of urinary retention managed who presents today for follow up after removal of SPT with his wife, Mike Holloway.  SPT was removed on 05/10/2019.    BPH WITH LUTS  (prostate and/or bladder) IPSS score: 11/5   PVR: 76 mL     Major complaint(s):  Urgency, incontinence, intermittency, hesitancy and a weak stream.  Denies any dysuria, hematuria or suprapubic pain.  He states he is losing his urine when he rises from a sitting to the standing position.    Currently taking: Myrbetriq 50 mg and tamsulosin 0.4 mg daily   Denies any recent fevers, chills, nausea or vomiting.   IPSS    Row Name 06/11/19 1500         International Prostate Symptom Score   How often have you had the sensation of not emptying your bladder?  Less than 1 in 5     How often have you had to urinate less than every two hours?  Less than half the time     How often have you found you stopped and started again several times when you urinated?  More than half the time     How often have you found it difficult to postpone urination?  More than half the time     How often have you had a weak urinary stream?  Not at All     How often have you had to strain to start urination?  Not at All     How many times did you typically get up at night to urinate?  None     Total IPSS Score  11       Quality of Life due to urinary symptoms   If you were to spend the rest of your life with your urinary condition just the way it is now how would you feel about that?  Unhappy        Score:  1-7 Mild 8-19 Moderate 20-35 Severe     PMH: Past Medical History:  Diagnosis Date  . Arthritis   . Atrial flutter ([place])   . Diabetes mellitus ([place])   . Essential tremor    . Essential tremor    deep brain stimulator   . Hypercholesteremia   . Hypertension   . Incontinence   . 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[place])    grew on the testical  . Sleep apnea   . Stroke [facility])     Surgical History: Past Surgical History:  Procedure Laterality Date  . ABLATION    . APPENDECTOMY    . CATARACT EXTRACTION, BILATERAL    . COLONOSCOPY WITH PROPOFOL N/A 03/17/2018   Procedure: COLONOSCOPY WITH PROPOFOL;  Surgeon: Jonathon Bellows, MD;  Location: [facility] ENDOSCOPY;  Service: Gastroenterology;  Laterality: N/A;  . DEEP BRAIN STIMULATOR PLACEMENT    . 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 HEMORRHOID SURGERY    . HERNIA REPAIR    . HIP FRACTURE SURGERY    . INTRAMEDULLARY (IM) NAIL INTERTROCHANTERIC Right 09/16/2018   Procedure: INTRAMEDULLARY (IM) NAIL INTERTROCHANTRIC;  Surgeon: Hessie Knows, MD;  Location: [facility] ORS;  Service: Orthopedics;  Laterality: Right;  . IR CATHETER TUBE CHANGE  11/22/2018  . NASAL SINUS SURGERY    . ORCHIECTOMY    . TONSILLECTOMY      Home Medications:  Allergies as of 06/11/2019      Reactions   Clindamycin Diarrhea   Contracted C. Diff x 2   Flagyl [metronidazole] Swelling   Swollen tongue, excessive shaking,     Ambien  [zolpidem]    Other reaction(s): Other (See Comments) Disoriented and moody Other reaction(s): Other (See Comments) Disoriented and moody   Penicillins Rash   Has patient had a PCN reaction causing immediate rash, facial/tongue/throat swelling, SOB or lightheadedness with hypotension: Unknown Has patient had a PCN reaction causing severe rash involving mucus membranes or skin necrosis: Unknown Has patient had a PCN reaction that required hospitalization: No Has patient had a PCN reaction occurring within the last 10 years: No If all of the above answers are "NO", then may proceed with Cephalosporin use.    Sulfa Antibiotics Rash      Medication List       Accurate as of June 11, 2019  4:27 PM. If you have any questions, ask your nurse or doctor.        acetaminophen 650 MG CR tablet Commonly known as: TYLENOL Take 650 mg by mouth every 8 (eight) hours as needed for pain.   aspirin EC 325 MG tablet Take 1 tablet (325 mg total) by mouth daily.   Azelastine HCl 0.15 % Soln U 1 SPRAY IEN BID   cholecalciferol 25 MCG (1000 UT) tablet Commonly known as: VITAMIN D Take 1,000 Units by mouth at bedtime.   diclofenac sodium 1 % Gel Commonly known as: VOLTAREN Apply 2 g topically 2 (two) times daily as needed (pain).   famotidine 20 MG tablet Commonly known as: PEPCID Take 20 mg by mouth 2 (two) times daily.   fexofenadine 180 MG tablet Commonly known as: ALLEGRA TK 1 T PO ONCE D   FLUoxetine 10 MG capsule Commonly known as: PROZAC Take 10 mg by mouth daily.   furosemide 20 MG tablet Commonly known as: LASIX Take by mouth.   guaiFENesin-dextromethorphan 100-10 MG/5ML syrup Commonly known as: ROBITUSSIN DM Take 5 mLs by mouth every 4 (four) hours as needed for cough.   Melatonin 5 MG Tabs Take 5 mg by mouth at bedtime. Takes 2 3 mg tablets   mirabegron ER 50 MG Tb24 tablet Commonly known as: MYRBETRIQ Take 1 tablet (50 mg total) by mouth daily.   primidone 50 MG tablet Commonly known as: MYSOLINE Take 50-100 mg by mouth 2 (two) times daily. Take 2 tablets (100mg ) by mouth every morning and 1 tablet (50mg ) by mouth every night at bedtime   propranolol ER 160 MG SR capsule Commonly known as: INDERAL LA Take 160 mg by mouth daily.   psyllium 95 % Pack Commonly known as: HYDROCIL/METAMUCIL Take 1 packet by mouth daily.   tamsulosin 0.4 MG Caps capsule Commonly known as: FLOMAX Take 0.4 mg by mouth daily.   UNABLE TO FIND APPLY FROM NECK DOWN DAILY   VSL#3 Caps Take 1 capsule by mouth daily.       Allergies:  Allergies  Allergen Reactions  .  Clindamycin Diarrhea    Contracted C. Diff x 2  . Flagyl [Metronidazole] Swelling    Swollen tongue, excessive shaking,    . Ambien  [Zolpidem]     Other reaction(s): Other (See Comments) Disoriented and moody Other  reaction(s): Other (See Comments) Disoriented and moody  . Penicillins Rash    Has patient had a PCN reaction causing immediate rash, facial/tongue/throat swelling, SOB or lightheadedness with hypotension: Unknown Has patient had a PCN reaction causing severe rash involving mucus membranes or skin necrosis: Unknown Has patient had a PCN reaction that required hospitalization: No Has patient had a PCN reaction occurring within the last 10 years: No If all of the above answers are "NO", then may proceed with Cephalosporin use.  . Sulfa Antibiotics Rash    Family History: Family History  Problem Relation Age of Onset  . Hematuria Father   . Prostate cancer Father   . Diabetes Father   . Heart disease Mother   . Diabetes Mother   . Kidney disease Neg Hx   . Bladder Cancer Neg Hx     Social History:  reports that he has never smoked. He has never used smokeless tobacco. He reports that he does not drink alcohol or use drugs.  ROS: UROLOGY Frequent Urination?: No Hard to postpone urination?: Yes Burning/pain with urination?: No Get up at night to urinate?: No Leakage of urine?: Yes Urine stream starts and stops?: Yes Trouble starting stream?: Yes Do you have to strain to urinate?: No Blood in urine?: No Urinary tract infection?: No Sexually transmitted disease?: No Injury to kidneys or bladder?: No Painful intercourse?: No Weak stream?: Yes Erection problems?: Yes Penile pain?: No  Gastrointestinal Nausea?: No Vomiting?: No Indigestion/heartburn?: No Diarrhea?: No Constipation?: No  Constitutional Fever: No Night sweats?: No Weight loss?: No Fatigue?: No  Skin Skin rash/lesions?: No Itching?: Yes  Eyes Blurred vision?: No Double vision?: No   Ears/Nose/Throat Sore throat?: No Sinus problems?: Yes  Hematologic/Lymphatic Swollen glands?: No Easy bruising?: No  Cardiovascular Leg swelling?: Yes Chest pain?: No  Respiratory Cough?: No Shortness of breath?: No  Endocrine Excessive thirst?: No  Musculoskeletal Back pain?: No Joint pain?: No  Neurological Headaches?: No Dizziness?: No  Psychologic Depression?: Yes Anxiety?: No  Physical Exam: BP 123/65 (BP Location: Left Arm, Patient Position: Sitting, Cuff Size: Normal)   Pulse 74   Ht 5' 8.75" (1.746 m)   BMI 29.90 kg/m   Constitutional:  Well nourished. Alert and oriented, No acute distress. HEENT: [place] AT, moist mucus membranes.  Trachea midline, no masses. Cardiovascular: No clubbing, cyanosis, or edema. Respiratory: Normal respiratory effort, no increased work of breathing. GI: Abdomen is soft, non tender, non distended, no abdominal masses. Liver and spleen not palpable.  No hernias appreciated.  Stool sample for occult testing is not indicated.   GU: No CVA tenderness.  No bladder fullness or masses.  SPT site is healed over.   Neurologic: Grossly intact, no focal deficits, moving all 4 extremities. Psychiatric: Normal mood and affect.  Laboratory Data: Component     Latest Ref Rng & Units 06/11/2019  Color, Urine     YELLOW YELLOW  Appearance     CLEAR HAZY (A)  Specific Gravity, Urine     1.005 - 1.030 1.015  pH     5.0 - 8.0 5.5  Glucose, UA     NEGATIVE mg/dL NEGATIVE  Hgb urine dipstick     NEGATIVE SMALL (A)  Bilirubin Urine     NEGATIVE NEGATIVE  Ketones, ur     NEGATIVE mg/dL NEGATIVE  Protein     NEGATIVE mg/dL NEGATIVE  Nitrite     NEGATIVE POSITIVE (A)  Leukocytes,Ua     NEGATIVE MODERATE (A)  RBC /  HPF     0 - 5 RBC/hpf 6-10  WBC, UA     0 - 5 WBC/hpf >50  Bacteria, UA     NONE SEEN MANY (A)  Squamous Epithelial / LPF     0 - 5 NONE SEEN    Assessment & Plan:    1. History of urinary retention Resolved.  2.  Incontinence Patient is still waiting on his CPAP machine.  Mike Holloway is going to speak with Dr. Bea Laura to see if he may discontinue the Lasix as she feels it is unneccessary at this time.  We will discontinue the tamsulosin to see if the incontinence improves and it seems to be related to stress.  Mike Holloway will monitor for signs of retention  Will send urine for culture to rule out indolent infection as a cause for his incontinence  RTC in one month   2. History of gross hematuria No reports of gross hematuria  3. Nocturia Patient in need of the CPAP machine   Zara Council, PA-C  [place] [address] [place], [place] [address] [phone]

## 2019-06-09 NOTE — Therapy (Addendum)
[facility] [address] [place], [place], [address] Phone: [phone]   Fax:  [phone]  Physical Therapy Treatment  Patient Details  Name: Mike Holloway MRN: [id number] Date of Birth: 04/13/1941 Referring Provider (PT): Dr. Rudene Christians   Encounter Date: 06/06/2019    Treatment: 7 of 15.  Recert date: 68/11/4194 1348 to 1449   Past Medical History:  Diagnosis Date  . Arthritis   . Atrial flutter ([place])   . Diabetes mellitus ([place])   . Essential tremor   . Essential tremor    deep brain stimulator   . Hypercholesteremia   . Hypertension   . Incontinence   . 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[place])    grew on the testical  . Sleep apnea   . Stroke [facility])     Past Surgical History:  Procedure Laterality Date  . ABLATION    . APPENDECTOMY    . CATARACT EXTRACTION, BILATERAL    . COLONOSCOPY WITH PROPOFOL N/A 03/17/2018   Procedure: COLONOSCOPY WITH PROPOFOL;  Surgeon: Jonathon Bellows, MD;  Location: [facility] ENDOSCOPY;  Service: Gastroenterology;  Laterality: N/A;  . DEEP BRAIN STIMULATOR PLACEMENT    . 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 HEMORRHOID SURGERY    . HERNIA REPAIR    . HIP FRACTURE SURGERY    . INTRAMEDULLARY (IM) NAIL INTERTROCHANTERIC Right 09/16/2018   Procedure: INTRAMEDULLARY (IM) NAIL INTERTROCHANTRIC;  Surgeon: Hessie Knows, MD;  Location: [facility] ORS;  Service: Orthopedics;  Laterality: Right;  . IR CATHETER TUBE CHANGE  11/22/2018  . NASAL SINUS SURGERY    . ORCHIECTOMY    . TONSILLECTOMY      There were no vitals filed for this visit.      [facility] PT Assessment - 06/10/19 0001      Assessment   Medical Diagnosis  S/p R subtrochanteric hip fracture, S/p R ORIF fracture of hip.      Referring Provider (PT)  Dr. Rudene Christians    Onset Date/Surgical Date  09/15/18    Prior Therapy  Yes, known well to  PT      Precautions   Precautions  [place] residence      Prior Function   Level of Independence  Needs assistance with gait       Pt. states he is still having R knee pain since receiving knee procedure last week. Pt. hasn't been as active over past week while healing up from procedure a week ago.       Therapeutic exercises:  Ambulation from parking lot through clinic with RW. Cuing for technique/decrease UE weightbearing  Seated LAQ 3# - 2x20 B LEs/ marching/ heel and toe raises/ standing hip extension/ knee flexion/ marching 20x. Ambulation/marching in // bars forward and backward, B UE support - 4x with prolonged seated rest break after 2nd set. Cuing for technique. Stepping over 3" plinth in // bars - x multiple repetitions  Forward/ backwards walking with cuing to decrease UE assist in //-bars 5x (mirror feedback) SciFit, L6 10 min - cool down (no increase c/o R knee pain)      PT Long Term Goals - 06/10/19 1346      PT LONG TERM GOAL #1   Title  Pt. independent with HEP to increase B hip flexion/ R quad muscle strength 1/2 muscle  grade to improve pain-free mobility.    Baseline  Decrease B LE muscle strength: hip flexion 4/5 MMT, L quad 4+/5 MMT, R quad 4/5 MMT, B hamstring 5/5 MMT, B DF/PF 5/5 MMT. B UE AROM WFL and strength grossly 5/5 MMT except R shoulder flexion 4+/5 MMT.    Time  4    Period  Weeks    Status  Partially Met    Target Date  07/04/19      PT LONG TERM GOAL #2   Title  Pt. will increase Berg balance test to >40 out of 56 to improve independence with gait/ decrease fall risk.    Baseline  Berg: 22/56 (significant fall risk)    Time  4    Period  Weeks    Status  On-going    Target Date  07/04/19      PT LONG TERM GOAL #3   Title  Pt. able to ambulate 100 feet with consistent 2 point gait pattern and use of least restrictive assistive devce to improve household mobility.    Baseline  Pt.  able to ambulate with use of RW/ CGA to min. A for safety.   Heavy use of B UE.    Time  4    Period  Weeks    Status  Partially Met    Target Date  07/04/19      PT LONG TERM GOAL #4   Title  Pt. able to stand from normal chair with no UE assist to improve safety/independence with transfers.     Baseline  Unable to stand from standard chair without heavy UE assist.    Time  4    Period  Weeks    Status  Not Met    Target Date  07/04/19            Plan - 06/10/19 1340    Clinical Impression Statement  Pt. continues to c/o persistent R knee pain (3/10) with all standing/ walking tasks.  Pt. requires heavy B UE support on RW and SBA/CGA for safety with walking/ balance tasks.  Pt. continues to ambulate with consistent step through gait pattern but unable to progress off the RW at this time.  Pts. primary limitation is increase R knee pain with L swing through phase of gait.  Pt with decreased tolerance for lateral stepping in // bars with reports of R knee pain and requires several seated rest breaks.  See updated goals.  Pt will continue to benefit from further skilled therapay to improve B LE strength and balance.    Stability/Clinical Decision Making  Evolving/Moderate complexity    Clinical Decision Making  Moderate    Rehab Potential  Good    PT Frequency  2x / week    PT Duration  4 weeks    PT Treatment/Interventions  ADLs/Self Care Home Management;Therapeutic activities;Functional mobility training;Stair training;Gait training;Therapeutic exercise;Balance training;Neuromuscular re-education;Patient/family education;Manual techniques;Passive range of motion    PT Next Visit Plan  Progress LE strengthening/ gait endurance/ safety; progress gait mechanics and gait exercises; progress static balance exercises; progress dynamic balance exercises    PT Home Exercise Plan  See handouts    Consulted and Agree with Plan of Care  Patient;Family member/caregiver    Family Member Consulted   spouse: [name]       Patient will benefit from skilled therapeutic intervention in order to improve the following deficits and impairments:  Abnormal gait, Decreased balance, Decreased endurance, Decreased mobility, Difficulty walking, Decreased range  of motion, Decreased activity tolerance, Decreased strength, Impaired flexibility, Postural dysfunction, Pain  Visit Diagnosis: Muscle weakness (generalized)  Gait difficulty  Abnormal posture     Problem List Patient Active Problem List   Diagnosis Date Noted  . Lymphedema 04/15/2019  . Hyponatremia 12/24/2018  . SIADH (syndrome of inappropriate ADH production) ([place]) 12/24/2018  . Erosion of urethra due to catheterization of urinary tract ([place]) 10/27/2018  . Generalized weakness 10/14/2018  . Hip fracture ([place]) 09/15/2018  . 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 HLD (hyperlipidemia) 03/24/2018  . Contusion of right knee 02/14/2018  . Bradycardia 12/28/2017  . 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 Chronic pain of right knee 08/10/2016  . Right ankle pain 08/10/2016  . 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  . 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[place]) 12/11/2015  . Urinary retention 09/08/2015  . Lymphoma, 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[place]) 04/03/2015  . Breathlessness on exertion 11/21/2014  . Breath shortness 11/21/2014  . Arthropathy 11/07/2014  . Atrial flutter, paroxysmal ([place]) 11/07/2014  . Type 2 diabetes mellitus ([place]) 11/07/2014  . Benign essential tremor 11/07/2014  . Benign essential HTN 11/07/2014  . Mixed incontinence 11/07/2014  . Hypercholesterolemia without hypertriglyceridemia 11/07/2014  . Apnea, sleep 11/07/2014  . Controlled type 2 diabetes mellitus without complication ([place]) 78/77/6548  . Pure hypercholesterolemia 11/07/2014  . Other abnormalities of gait and mobility 11/02/2011  . Abnormal gait  06/29/2011  . Discoordination 06/29/2011   Pura Spice, PT, DPT # [id number] 06/10/2019, 2:09 PM  [place] [facility] [address] [place], [place], [address] Phone: [phone]   Fax:  [phone]  Name: Mike Holloway MRN: [id number] Date of Birth: 1940/11/25

## 2019-06-10 ENCOUNTER — Encounter: Payer: Self-pay | Admitting: Physical Therapy

## 2019-06-10 NOTE — Addendum Note (Signed)
Addended by: Pura Spice on: 06/10/2019 02:12 PM   Modules accepted: Orders

## 2019-06-11 ENCOUNTER — Ambulatory Visit: Payer: Medicare Other | Admitting: Physical Therapy

## 2019-06-11 ENCOUNTER — Other Ambulatory Visit
Admission: RE | Admit: 2019-06-11 | Discharge: 2019-06-11 | Disposition: A | Discharge status: Home / Self Care | Payer: Medicare Other | Source: Ambulatory Visit | Attending: Urology | Admitting: Urology

## 2019-06-11 ENCOUNTER — Other Ambulatory Visit: Payer: Self-pay

## 2019-06-11 ENCOUNTER — Encounter: Payer: Self-pay | Admitting: Urology

## 2019-06-11 ENCOUNTER — Ambulatory Visit (INDEPENDENT_AMBULATORY_CARE_PROVIDER_SITE_OTHER): Payer: Medicare Other | Admitting: Urology

## 2019-06-11 VITALS — BP 123/65 | HR 74 | Ht 68.75 in

## 2019-06-11 DIAGNOSIS — R351 Nocturia: Secondary | ICD-10-CM | POA: Diagnosis not present

## 2019-06-11 DIAGNOSIS — Z87448 Personal history of other diseases of urinary system: Secondary | ICD-10-CM | POA: Diagnosis not present

## 2019-06-11 DIAGNOSIS — M6281 Muscle weakness (generalized): Secondary | ICD-10-CM | POA: Diagnosis not present

## 2019-06-11 DIAGNOSIS — R269 Unspecified abnormalities of gait and mobility: Secondary | ICD-10-CM

## 2019-06-11 DIAGNOSIS — Z87898 Personal history of other specified conditions: Secondary | ICD-10-CM

## 2019-06-11 DIAGNOSIS — R293 Abnormal posture: Secondary | ICD-10-CM

## 2019-06-11 LAB — URINALYSIS, COMPLETE (UACMP) WITH MICROSCOPIC
Bilirubin Urine: NEGATIVE
Glucose, UA: NEGATIVE mg/dL
Ketones, ur: NEGATIVE mg/dL
Nitrite: POSITIVE — AB
Protein, ur: NEGATIVE mg/dL
Specific Gravity, Urine: 1.015 (ref 1.005–1.030)
Squamous Epithelial / HPF: NONE SEEN (ref 0–5)
WBC, UA: >50 WBC/hpf (ref 0–5)
pH: 5.5 (ref 5.0–8.0)

## 2019-06-11 LAB — BLADDER SCAN AMB NON-IMAGING: Scan Result: 76

## 2019-06-12 ENCOUNTER — Encounter: Payer: Self-pay | Admitting: Physical Therapy

## 2019-06-12 NOTE — Therapy (Signed)
[place] [facility] [address] [place], [place], [address] Phone: [phone]   Fax:  [phone]  Physical Therapy Treatment  Patient Details  Name: Mike Holloway MRN: [id number] Date of Birth: Sep 18, 1941 Referring Provider (PT): Dr. Rudene Christians   Encounter Date: 06/11/2019  PT End of Session - 06/12/19 1016    Visit Number  9    Number of Visits  15    Date for PT Re-Evaluation  07/04/19    Authorization - Visit Number  3    Authorization - Number of Visits  10    PT Start Time  1336    PT Stop Time  1444    PT Time Calculation (min)  68 min    Equipment Utilized During Treatment  Gait belt    Activity Tolerance  Patient tolerated treatment well;Patient limited by pain    Behavior During Therapy  [facility] for tasks assessed/performed       Past Medical History:  Diagnosis Date  . Arthritis   . Atrial flutter ([place])   . Diabetes mellitus ([place])   . Essential tremor   . Essential tremor    deep brain stimulator   . Hypercholesteremia   . Hypertension   . Incontinence   . 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 Sleep apnea   . Stroke [facility])     Past Surgical History:  Procedure Laterality Date  . ABLATION    . APPENDECTOMY    . CATARACT EXTRACTION, BILATERAL    . COLONOSCOPY WITH PROPOFOL N/A 03/17/2018   Procedure: COLONOSCOPY WITH PROPOFOL;  Surgeon: Jonathon Bellows, MD;  Location: [facility] ENDOSCOPY;  Service: Gastroenterology;  Laterality: N/A;  . DEEP BRAIN STIMULATOR PLACEMENT    . 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 HEMORRHOID SURGERY    . HERNIA REPAIR    . HIP FRACTURE SURGERY    . INTRAMEDULLARY (IM) NAIL INTERTROCHANTERIC Right 09/16/2018   Procedure: INTRAMEDULLARY (IM) NAIL INTERTROCHANTRIC;  Surgeon: Hessie Knows, MD;  Location: [facility] ORS;  Service: Orthopedics;  Laterality: Right;   . IR CATHETER TUBE CHANGE  11/22/2018  . NASAL SINUS SURGERY    . ORCHIECTOMY    . TONSILLECTOMY      There were no vitals filed for this visit.  Subjective Assessment - 06/12/19 1005    Subjective  Pt reports 0/10 pain at rest, but reports 2/10 pain with weightbearing. Pt reports urology appointment after PT today. Pt reports using BioFreeze for knee pain modulation.    Patient Stated Goals  Improve LE strength/ gait and balance with daily tasks.  Improve gait/ safety/ progression to least assistive device.    Currently in Pain?  Yes    Pain Score  2     Pain Location  Knee    Pain Orientation  Right       Therapeutic Exercise:   Nustep L4, 10 min Seated LAQ - 1x15 without weights, 1x20 with 3# ankle weights  Staggered stance with unilateral UE support - 4x30 sec B UEs. Must use B UE support to get into position and to take a step. Pt reports more difficult with R leg as stance leg. Static lunge weight shifts - B LEs Unilateral UE support. Pt reports increase to 8/10 pain with position.  Pt reports pain "goes away" out of position.  Seated LAQ without weight  of R LE - 1x15  Seated heel raises- 2x20 Seated toe raises- 2x20 BTB hamstring curls - 2x15 B LEs Gait in // bars - 2x Marching in // bars 2x    PT Education - 06/12/19 1006    Education Details  Pt educated on staggered stance exercise technique    Person(s) Educated  Patient    Methods  Explanation;Demonstration    Comprehension  Verbalized understanding;Returned demonstration          PT Long Term Goals - 06/10/19 1346      PT LONG TERM GOAL #1   Title  Pt. independent with HEP to increase B hip flexion/ R quad muscle strength 1/2 muscle grade to improve pain-free mobility.    Baseline  Decrease B LE muscle strength: hip flexion 4/5 MMT, L quad 4+/5 MMT, R quad 4/5 MMT, B hamstring 5/5 MMT, B DF/PF 5/5 MMT. B UE AROM WFL and strength grossly 5/5 MMT except R shoulder flexion 4+/5 MMT.    Time  4    Period   Weeks    Status  Partially Met    Target Date  07/04/19      PT LONG TERM GOAL #2   Title  Pt. will increase Berg balance test to >40 out of 56 to improve independence with gait/ decrease fall risk.    Baseline  Berg: 22/56 (significant fall risk)    Time  4    Period  Weeks    Status  On-going    Target Date  07/04/19      PT LONG TERM GOAL #3   Title  Pt. able to ambulate 100 feet with consistent 2 point gait pattern and use of least restrictive assistive devce to improve household mobility.    Baseline  Pt. able to ambulate with use of RW/ CGA to min. A for safety.   Heavy use of B UE.    Time  4    Period  Weeks    Status  Partially Met    Target Date  07/04/19      PT LONG TERM GOAL #4   Title  Pt. able to stand from normal chair with no UE assist to improve safety/independence with transfers.     Baseline  Unable to stand from standard chair without heavy UE assist.    Time  4    Period  Weeks    Status  Not Met    Target Date  07/04/19            Plan - 06/12/19 1015    Clinical Impression Statement  Pt unable to assume staggered stance exercise position without using B UEs for support d/t increased pain with weightbearing on R LE. However, pt able to perform exercise with support of one UE once in position without change in pain sx. Pt reported greater difficulty with R leg as predominant stance leg compared to L LE. Pt did demonstrate increased pain to 8/10 of R LE with static lunge weight shifting exercise, but pain resolved back to baseline once pt resting in seated position. Pt able to perform seated therex without increased R knee pain.  Pt will continue to benefit from further skilled therapy to improve gait mechanics and B LE strength to improve QOL.    Stability/Clinical Decision Making  Evolving/Moderate complexity    Clinical Decision Making  Moderate    Rehab Potential  Good    PT Frequency  2x / week    PT  Duration  4 weeks    PT Treatment/Interventions   ADLs/Self Care Home Management;Therapeutic activities;Functional mobility training;Stair training;Gait training;Therapeutic exercise;Balance training;Neuromuscular re-education;Patient/family education;Manual techniques;Passive range of motion    PT Next Visit Plan  weight shifting exercises, gait mechanics    PT Home Exercise Plan  See handouts    Consulted and Agree with Plan of Care  Patient;Family member/caregiver    Family Member Consulted  spouse: [name]       Patient will benefit from skilled therapeutic intervention in order to improve the following deficits and impairments:  Abnormal gait, Decreased balance, Decreased endurance, Decreased mobility, Difficulty walking, Decreased range of motion, Decreased activity tolerance, Decreased strength, Impaired flexibility, Postural dysfunction, Pain  Visit Diagnosis: Muscle weakness (generalized)  Gait difficulty  Abnormal posture     Problem List Patient Active Problem List   Diagnosis Date Noted  . Lymphedema 04/15/2019  . Hyponatremia 12/24/2018  . SIADH (syndrome of inappropriate ADH production) ([place]) 12/24/2018  . Erosion of urethra due to catheterization of urinary tract ([place]) 10/27/2018  . Generalized weakness 10/14/2018  . Hip fracture ([place]) 09/15/2018  . 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 HLD (hyperlipidemia) 03/24/2018  . Contusion of right knee 02/14/2018  . Bradycardia 12/28/2017  . 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 Chronic pain of right knee 08/10/2016  . Right ankle pain 08/10/2016  . 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  . 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[place]) 12/11/2015  . Urinary retention 09/08/2015  . Lymphoma, 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[place]) 04/03/2015  . Breathlessness on exertion 11/21/2014  . Breath shortness 11/21/2014  . Arthropathy 11/07/2014  . Atrial flutter, paroxysmal ([place])  11/07/2014  . Type 2 diabetes mellitus ([place]) 11/07/2014  . Benign essential tremor 11/07/2014  . Benign essential HTN 11/07/2014  . Mixed incontinence 11/07/2014  . Hypercholesterolemia without hypertriglyceridemia 11/07/2014  . Apnea, sleep 11/07/2014  . Controlled type 2 diabetes mellitus without complication ([place]) 23/41/4436  . Pure hypercholesterolemia 11/07/2014  . Other abnormalities of gait and mobility 11/02/2011  . Abnormal gait 06/29/2011  . Discoordination 06/29/2011   Pura Spice, PT, DPT # [id number] Ricard Dillon, SPT 06/12/2019, 10:18 AM  [place] [facility] [address] [place], [place], [address] Phone: [phone]   Fax:  [phone]  Name: Mike Holloway MRN: [id number] Date of Birth: 05-May-1941

## 2019-06-13 ENCOUNTER — Ambulatory Visit: Payer: Medicare Other | Admitting: Physical Therapy

## 2019-06-13 ENCOUNTER — Other Ambulatory Visit: Payer: Self-pay

## 2019-06-13 DIAGNOSIS — R293 Abnormal posture: Secondary | ICD-10-CM

## 2019-06-13 DIAGNOSIS — R269 Unspecified abnormalities of gait and mobility: Secondary | ICD-10-CM

## 2019-06-13 DIAGNOSIS — M6281 Muscle weakness (generalized): Secondary | ICD-10-CM

## 2019-06-14 ENCOUNTER — Encounter: Payer: Self-pay | Admitting: Physical Therapy

## 2019-06-14 ENCOUNTER — Telehealth: Payer: Self-pay

## 2019-06-14 LAB — URINE CULTURE: Culture: >=100000 — AB

## 2019-06-14 MED ORDER — CEPHALEXIN 500 MG PO CAPS: 500.0000 mg | ORAL_CAPSULE | Freq: Four times a day (QID) | ORAL | 0 refills | Status: DC

## 2019-06-14 NOTE — Telephone Encounter (Signed)
-----   Message from Nori Riis, PA-C sent at 06/14/2019  8:02 AM EDT ----- Please let the [facility] know that Mr. Prew urine culture returned positive for infection.  I would like for him to start Keflex 500 mg, BID x 7 days to see if this infection is causing his incontinence to be worse.

## 2019-06-14 NOTE — Telephone Encounter (Signed)
Patient's wife notified and script sent into pharm

## 2019-06-15 ENCOUNTER — Telehealth: Payer: Self-pay

## 2019-06-15 NOTE — Therapy (Addendum)
[place] [facility] [address] [place], [place], [address] Phone: [phone]   Fax:  [phone]  Physical Therapy Treatment  Patient Details  Name: Mike Holloway MRN: [id number] Date of Birth: [birth year] Referring Provider (PT): Dr. Rudene Christians   Encounter Date: 06/13/2019    PT End of Session - 06/14/19 1609    Visit Number  10    Number of Visits  15    Date for PT Re-Evaluation  07/04/19    Authorization - Visit Number  4    Authorization - Number of Visits  10    PT Start Time  1344    PT Stop Time  3536    PT Time Calculation (min)  53 min    Equipment Utilized During Treatment  Gait belt    Activity Tolerance  Patient tolerated treatment well;Patient limited by pain    Behavior During Therapy  [facility] for tasks assessed/performed        Past Medical History:  Diagnosis Date  . Arthritis   . Atrial flutter ([place])   . Diabetes mellitus ([place])   . Essential tremor   . Essential tremor    deep brain stimulator   . Hypercholesteremia   . Hypertension   . Incontinence   . 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[place])    grew on the testical  . Sleep apnea   . Stroke [facility])     Past Surgical History:  Procedure Laterality Date  . ABLATION    . APPENDECTOMY    . CATARACT EXTRACTION, BILATERAL    . COLONOSCOPY WITH PROPOFOL N/A 03/17/2018   Procedure: COLONOSCOPY WITH PROPOFOL;  Surgeon: Jonathon Bellows, MD;  Location: [facility] ENDOSCOPY;  Service: Gastroenterology;  Laterality: N/A;  . DEEP BRAIN STIMULATOR PLACEMENT    . 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 HEMORRHOID SURGERY    . HERNIA REPAIR    . HIP FRACTURE SURGERY    . INTRAMEDULLARY (IM) NAIL INTERTROCHANTERIC Right 09/16/2018   Procedure: INTRAMEDULLARY (IM) NAIL INTERTROCHANTRIC;  Surgeon: Hessie Knows, MD;  Location: [facility] ORS;  Service: Orthopedics;  Laterality:  Right;  . IR CATHETER TUBE CHANGE  11/22/2018  . NASAL SINUS SURGERY    . ORCHIECTOMY    . TONSILLECTOMY      There were no vitals filed for this visit.    Pt. entered PT with no new complaints (knee discomfort is still present). Pts. wife brought in a pool noodle to adapt to RW handles to decrease grasping.         Therapeutic Exercise:   Nustep L4, 10 min Seated LAQ - 1x15 without weights, 1x20 with 3# ankle weights  Staggered stance with unilateral UE support - 4x30 sec B UEs. Must use B UE support to get into position and to take a step. Pt reports more difficult with R leg as stance leg. Static lunge weight shifts - B LEs Unilateral UE support. Pt reports increase to 8/10 pain with position.  Pt reports pain "goes away" out of position.  Functional reaching: L/R UE with use of cones.   Seated LAQ without weight of R LE - 1x15  Seated heel and toe raises- 2x20 BTB hamstring curls - 2x15 B LEs Gait in // bars - 2x (Forward, backwards, lateral).   Marching in // bars 2x Reviewed HEP in depth   PT Long Term Goals - 06/10/19 1346  PT LONG TERM GOAL #1   Title  Pt. independent with HEP to increase B hip flexion/ R quad muscle strength 1/2 muscle grade to improve pain-free mobility.    Baseline  Decrease B LE muscle strength: hip flexion 4/5 MMT, L quad 4+/5 MMT, R quad 4/5 MMT, B hamstring 5/5 MMT, B DF/PF 5/5 MMT. B UE AROM WFL and strength grossly 5/5 MMT except R shoulder flexion 4+/5 MMT.    Time  4    Period  Weeks    Status  Partially Met    Target Date  07/04/19      PT LONG TERM GOAL #2   Title  Pt. will increase Berg balance test to >40 out of 56 to improve independence with gait/ decrease fall risk.    Baseline  Berg: 22/56 (significant fall risk)    Time  4    Period  Weeks    Status  On-going    Target Date  07/04/19      PT LONG TERM GOAL #3   Title  Pt. able to ambulate 100 feet with consistent 2 point gait pattern and use of least restrictive  assistive devce to improve household mobility.    Baseline  Pt. able to ambulate with use of RW/ CGA to min. A for safety.   Heavy use of B UE.    Time  4    Period  Weeks    Status  Partially Met    Target Date  07/04/19      PT LONG TERM GOAL #4   Title  Pt. able to stand from normal chair with no UE assist to improve safety/independence with transfers.     Baseline  Unable to stand from standard chair without heavy UE assist.    Time  4    Period  Weeks    Status  Not Met    Target Date  07/04/19         Plan - 06/18/19 0903    Clinical Impression Statement  Pt. continues to require heavy use of B UE with all standing/walking tasks.  PT working on increase LE wt. bearing with walking/ stance phase of gait.  Increase knee discomfort with prolonged wt. bearing activities/ wt. shifting.  Pt. request occasional seated rest breaks due to knee pain, not fatigue.    Stability/Clinical Decision Making  Evolving/Moderate complexity    Clinical Decision Making  Moderate    Rehab Potential  Good    PT Frequency  2x / week    PT Duration  4 weeks    PT Treatment/Interventions  ADLs/Self Care Home Management;Therapeutic activities;Functional mobility training;Stair training;Gait training;Therapeutic exercise;Balance training;Neuromuscular re-education;Patient/family education;Manual techniques;Passive range of motion    PT Next Visit Plan  weight shifting exercises, gait mechanics (decrease UE assist)    PT Home Exercise Plan  See handouts    Consulted and Agree with Plan of Care  Patient;Family member/caregiver    Family Member Consulted  spouse: [name]       Patient will benefit from skilled therapeutic intervention in order to improve the following deficits and impairments:  Abnormal gait, Decreased balance, Decreased endurance, Decreased mobility, Difficulty walking, Decreased range of motion, Decreased activity tolerance, Decreased strength, Impaired flexibility, Postural dysfunction,  Pain  Visit Diagnosis: Muscle weakness (generalized)  Gait difficulty  Abnormal posture     Problem List Patient Active Problem List   Diagnosis Date Noted  . Lymphedema 04/15/2019  . Hyponatremia 12/24/2018  . SIADH (syndrome of  inappropriate ADH production) ([place]) 12/24/2018  . Erosion of urethra due to catheterization of urinary tract ([place]) 10/27/2018  . Generalized weakness 10/14/2018  . Hip fracture ([place]) 09/15/2018  . 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 HLD (hyperlipidemia) 03/24/2018  . Contusion of right knee 02/14/2018  . Bradycardia 12/28/2017  . 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 Chronic pain of right knee 08/10/2016  . Right ankle pain 08/10/2016  . 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  . 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[place]) 12/11/2015  . Urinary retention 09/08/2015  . Lymphoma, 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[place]) 04/03/2015  . Breathlessness on exertion 11/21/2014  . Breath shortness 11/21/2014  . Arthropathy 11/07/2014  . Atrial flutter, paroxysmal ([place]) 11/07/2014  . Type 2 diabetes mellitus ([place]) 11/07/2014  . Benign essential tremor 11/07/2014  . Benign essential HTN 11/07/2014  . Mixed incontinence 11/07/2014  . Hypercholesterolemia without hypertriglyceridemia 11/07/2014  . Apnea, sleep 11/07/2014  . Controlled type 2 diabetes mellitus without complication ([place]) 19/50/9326  . Pure hypercholesterolemia 11/07/2014  . Other abnormalities of gait and mobility 11/02/2011  . Abnormal gait 06/29/2011  . Discoordination 06/29/2011   Pura Spice, PT, DPT # [phone] 06/18/2019, 9:18 AM  [place] [facility] [address] [place], [place], [address] Phone: [phone]   Fax:  [phone]  Name: Mike Holloway MRN: [id number] Date of Birth: 1941-03-25

## 2019-06-15 NOTE — Telephone Encounter (Signed)
Pt's wife calls and states that she wants to clarify pt's husband rx for cephalexin. Rx that was given to patient says 4 times daily, however pt was told to take 2 times daily. Advised pt that the RX should be for twice daily not four times daily. Pt and wife gave verbal understanding.

## 2019-06-18 ENCOUNTER — Ambulatory Visit: Payer: Medicare Other | Admitting: Physical Therapy

## 2019-06-18 ENCOUNTER — Other Ambulatory Visit: Payer: Self-pay

## 2019-06-18 ENCOUNTER — Encounter: Payer: Self-pay | Admitting: Physical Therapy

## 2019-06-18 DIAGNOSIS — R269 Unspecified abnormalities of gait and mobility: Secondary | ICD-10-CM

## 2019-06-18 DIAGNOSIS — M6281 Muscle weakness (generalized): Secondary | ICD-10-CM

## 2019-06-18 DIAGNOSIS — R293 Abnormal posture: Secondary | ICD-10-CM

## 2019-06-18 NOTE — Therapy (Signed)
[place] [facility] [address]. [place], [place], [address] Phone: [phone]   Fax:  [phone]  Physical Therapy Treatment  Patient Details  Name: Mike Holloway MRN: [id number] Date of Birth: Sep 25, 1941 Referring Provider (PT): Dr. Rudene Christians   Encounter Date: 06/18/2019    Treatment: 11 pf 15.  Recert date: 65/0/3546 1344 to 1449   Past Medical History:  Diagnosis Date  . Arthritis   . Atrial flutter ([place])   . Diabetes mellitus ([place])   . Essential tremor   . Essential tremor    deep brain stimulator   . Hypercholesteremia   . Hypertension   . Incontinence   . 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 Sleep apnea   . Stroke [facility])     Past Surgical History:  Procedure Laterality Date  . ABLATION    . APPENDECTOMY    . CATARACT EXTRACTION, BILATERAL    . COLONOSCOPY WITH PROPOFOL N/A 03/17/2018   Procedure: COLONOSCOPY WITH PROPOFOL;  Surgeon: Jonathon Bellows, MD;  Location: [facility] ENDOSCOPY;  Service: Gastroenterology;  Laterality: N/A;  . DEEP BRAIN STIMULATOR PLACEMENT    . 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 HEMORRHOID SURGERY    . HERNIA REPAIR    . HIP FRACTURE SURGERY    . INTRAMEDULLARY (IM) NAIL INTERTROCHANTERIC Right 09/16/2018   Procedure: INTRAMEDULLARY (IM) NAIL INTERTROCHANTRIC;  Surgeon: Hessie Knows, MD;  Location: [facility] ORS;  Service: Orthopedics;  Laterality: Right;  . IR CATHETER TUBE CHANGE  11/22/2018  . NASAL SINUS SURGERY    . ORCHIECTOMY    . TONSILLECTOMY      There were no vitals filed for this visit.    Pt reports taking Tylenol prior to PT session today. Pt reports pain currently 3/10 in R knee and that R knee pain has been increased over past few days.       Therapeutic Exercise:  Nustep L4 for 7 min, L3 for 5 min. Seated LAQ, 5# ankle weights-  2x20 Seated marching with 5# ankle weights - 2x20  Neuro:  Gait over hidden obstacles under math and around cones, CGA - 2x Standing fishing (fixed BOS) with staggered stance (L LE back), fishing pole in R UE - x several minutes Standing on foam (fixed BOS) fishing, - fishing pole in R UE -7 min. Pt reported improved knee sx with standing on foam. Gait with dual task, reading words, letters, numbers - 2x. Performed task well, but with decreased step length throughout     PT Long Term Goals - 06/10/19 1346      PT LONG TERM GOAL #1   Title  Pt. independent with HEP to increase B hip flexion/ R quad muscle strength 1/2 muscle grade to improve pain-free mobility.    Baseline  Decrease B LE muscle strength: hip flexion 4/5 MMT, L quad 4+/5 MMT, R quad 4/5 MMT, B hamstring 5/5 MMT, B DF/PF 5/5 MMT. B UE AROM WFL and strength grossly 5/5 MMT except R shoulder flexion 4+/5 MMT.    Time  4    Period  Weeks    Status  Partially Met    Target Date  07/04/19      PT LONG TERM GOAL #2   Title  Pt. will increase Berg balance test to >40 out of 56 to improve independence with gait/ decrease  fall risk.    Baseline  Berg: 22/56 (significant fall risk)    Time  4    Period  Weeks    Status  On-going    Target Date  07/04/19      PT LONG TERM GOAL #3   Title  Pt. able to ambulate 100 feet with consistent 2 point gait pattern and use of least restrictive assistive devce to improve household mobility.    Baseline  Pt. able to ambulate with use of RW/ CGA to min. A for safety.   Heavy use of B UE.    Time  4    Period  Weeks    Status  Partially Met    Target Date  07/04/19      PT LONG TERM GOAL #4   Title  Pt. able to stand from normal chair with no UE assist to improve safety/independence with transfers.     Baseline  Unable to stand from standard chair without heavy UE assist.    Time  4    Period  Weeks    Status  Not Met    Target Date  07/04/19          Plan - 06/20/19 6213     Clinical Impression Statement  Pt continues to be limited with exercises due to R knee pain and requires prolonged rest breaks between each exercise secondary to pain and fatigue. During obstacle course with RW pt with weightbearing more heavily on R UE (approx. 40%) compared to L. Pt cued to use fishing pole in R hand during standing fishing balance exercise to discourage R UE weightbearing. Pt able to perform fishing exercise but with reports of increased R knee pain. When pt performed fishing exercise on foam pad, pt reported improved R knee sx and without decreased postural stability. Pt with decreased B step length and had difficulty correcting with frequent verbal cues. Pt with decreased step length on L still, indicative of decrased weightbearing and stance time on R. Pt will benefit from further skilled therapy to improve gait mechanics and increase B LE strength.    Stability/Clinical Decision Making  Evolving/Moderate complexity    Clinical Decision Making  Moderate    Rehab Potential  Good    PT Frequency  2x / week    PT Duration  4 weeks    PT Treatment/Interventions  ADLs/Self Care Home Management;Therapeutic activities;Functional mobility training;Stair training;Gait training;Therapeutic exercise;Balance training;Neuromuscular re-education;Patient/family education;Manual techniques;Passive range of motion    PT Next Visit Plan  weight shifting exercises, gait mechanics (decrease UE assist), new HEP    PT Home Exercise Plan  See handouts    Consulted and Agree with Plan of Care  Patient;Family member/caregiver    Family Member Consulted  spouse: [name]       Patient will benefit from skilled therapeutic intervention in order to improve the following deficits and impairments:  Abnormal gait, Decreased balance, Decreased endurance, Decreased mobility, Difficulty walking, Decreased range of motion, Decreased activity tolerance, Decreased strength, Impaired flexibility, Postural dysfunction,  Pain  Visit Diagnosis: Muscle weakness (generalized)  Gait difficulty  Abnormal posture     Problem List Patient Active Problem List   Diagnosis Date Noted  . Lymphedema 04/15/2019  . Hyponatremia 12/24/2018  . SIADH (syndrome of inappropriate ADH production) ([place]) 12/24/2018  . Erosion of urethra due to catheterization of urinary tract ([place]) 10/27/2018  . Generalized weakness 10/14/2018  . Hip fracture ([place]) 09/15/2018  . 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 HLD (hyperlipidemia) 03/24/2018  . Contusion of right knee 02/14/2018  . Bradycardia 12/28/2017  . 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 Chronic pain of right knee 08/10/2016  . Right ankle pain 08/10/2016  . 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  . 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[place]) 12/11/2015  . Urinary retention 09/08/2015  . Lymphoma, 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[place]) 04/03/2015  . Breathlessness on exertion 11/21/2014  . Breath shortness 11/21/2014  . Arthropathy 11/07/2014  . Atrial flutter, paroxysmal ([place]) 11/07/2014  . Type 2 diabetes mellitus ([place]) 11/07/2014  . Benign essential tremor 11/07/2014  . Benign essential HTN 11/07/2014  . Mixed incontinence 11/07/2014  . Hypercholesterolemia without hypertriglyceridemia 11/07/2014  . Apnea, sleep 11/07/2014  . Controlled type 2 diabetes mellitus without complication ([place]) 93/57/0177  . Pure hypercholesterolemia 11/07/2014  . Other abnormalities of gait and mobility 11/02/2011  . Abnormal gait 06/29/2011  . Discoordination 06/29/2011   Pura Spice, PT, DPT # [id number] Ricard Dillon, SPT 06/20/2019, 9:56 AM  [place] [facility] [address] [place], [place], [address] Phone: [phone]   Fax:  [phone]  Name: Mike Holloway MRN: [id number] Date of Birth: 1941/01/01

## 2019-06-21 ENCOUNTER — Encounter: Payer: Self-pay | Admitting: Physical Therapy

## 2019-06-21 ENCOUNTER — Other Ambulatory Visit: Payer: Self-pay

## 2019-06-21 ENCOUNTER — Ambulatory Visit: Payer: Medicare Other

## 2019-06-21 DIAGNOSIS — R293 Abnormal posture: Secondary | ICD-10-CM

## 2019-06-21 DIAGNOSIS — R269 Unspecified abnormalities of gait and mobility: Secondary | ICD-10-CM

## 2019-06-21 DIAGNOSIS — M6281 Muscle weakness (generalized): Secondary | ICD-10-CM

## 2019-06-21 NOTE — Therapy (Signed)
[facility] [address] [place], [place], [address] Phone: [phone]   Fax:  [phone]  Physical Therapy Treatment  Patient Details  Name: Mike Holloway MRN: [id number] Date of Birth: 12-16-1940 Referring Provider (PT): Dr. Rudene Christians   Encounter Date: 06/21/2019  PT End of Session - 06/21/19 1535    Visit Number  12    Number of Visits  15    Date for PT Re-Evaluation  07/04/19    Authorization - Visit Number  6    Authorization - Number of Visits  10    PT Start Time  0151    PT Stop Time  0243    PT Time Calculation (min)  52 min    Equipment Utilized During Treatment  Gait belt    Activity Tolerance  Patient tolerated treatment well;Patient limited by pain    Behavior During Therapy  [facility] for tasks assessed/performed       Past Medical History:  Diagnosis Date  . Arthritis   . Atrial flutter ([place])   . Diabetes mellitus ([place])   . Essential tremor   . Essential tremor    deep brain stimulator   . Hypercholesteremia   . Hypertension   . Incontinence   . 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[place])    grew on the testical  . Sleep apnea   . Stroke [facility])     Past Surgical History:  Procedure Laterality Date  . ABLATION    . APPENDECTOMY    . CATARACT EXTRACTION, BILATERAL    . COLONOSCOPY WITH PROPOFOL N/A 03/17/2018   Procedure: COLONOSCOPY WITH PROPOFOL;  Surgeon: Jonathon Bellows, MD;  Location: [facility] ENDOSCOPY;  Service: Gastroenterology;  Laterality: N/A;  . DEEP BRAIN STIMULATOR PLACEMENT    . 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 HEMORRHOID SURGERY    . HERNIA REPAIR    . HIP FRACTURE SURGERY    . INTRAMEDULLARY (IM) NAIL INTERTROCHANTERIC Right 09/16/2018   Procedure: INTRAMEDULLARY (IM) NAIL INTERTROCHANTRIC;  Surgeon: Hessie Knows, MD;  Location: [facility] ORS;  Service: Orthopedics;  Laterality:  Right;  . IR CATHETER TUBE CHANGE  11/22/2018  . NASAL SINUS SURGERY    . ORCHIECTOMY    . TONSILLECTOMY      There were no vitals filed for this visit.  Subjective Assessment - 06/21/19 1402    Subjective  Pt reports improved R knee pain today and that he did not need to take Tylenol this morning. Pt reports he did use biofreeze on R knee.    Patient is accompained by:  Family member    Patient Stated Goals  Improve LE strength/ gait and balance with daily tasks.  Improve gait/ safety/ progression to least assistive device.    Currently in Pain?  Yes       Therapeutic Exercise: CGA provided CGA throughout Nustep, L4, 10 min Stand>sit eccentrics - 3x. Improved eccentric control of quads Seated marches, 5# ankle weight on L, 3# on R LE - 2x20 (with blue foam on chair to increased marching height) LAQ, 5# ankle weight on L 3# on RLE 2x20  Neuro: Standing cone taps with UE support - 4x B LEs [place] (fixed BOS on unstable surface - x several minutes. Cuing provided Gait training with RW -  3x around clinic cues for stride length, RW use     PT Long Term Goals -  06/10/19 1346      PT LONG TERM GOAL #1   Title  Pt. independent with HEP to increase B hip flexion/ R quad muscle strength 1/2 muscle grade to improve pain-free mobility.    Baseline  Decrease B LE muscle strength: hip flexion 4/5 MMT, L quad 4+/5 MMT, R quad 4/5 MMT, B hamstring 5/5 MMT, B DF/PF 5/5 MMT. B UE AROM WFL and strength grossly 5/5 MMT except R shoulder flexion 4+/5 MMT.    Time  4    Period  Weeks    Status  Partially Met    Target Date  07/04/19      PT LONG TERM GOAL #2   Title  Pt. will increase Berg balance test to >40 out of 56 to improve independence with gait/ decrease fall risk.    Baseline  Berg: 22/56 (significant fall risk)    Time  4    Period  Weeks    Status  On-going    Target Date  07/04/19      PT LONG TERM GOAL #3   Title  Pt. able to ambulate 100 feet with consistent 2 point gait  pattern and use of least restrictive assistive devce to improve household mobility.    Baseline  Pt. able to ambulate with use of RW/ CGA to min. A for safety.   Heavy use of B UE.    Time  4    Period  Weeks    Status  Partially Met    Target Date  07/04/19      PT LONG TERM GOAL #4   Title  Pt. able to stand from normal chair with no UE assist to improve safety/independence with transfers.     Baseline  Unable to stand from standard chair without heavy UE assist.    Time  4    Period  Weeks    Status  Not Met    Target Date  07/04/19            Plan - 06/21/19 1537    Clinical Impression Statement  Pt shows imporvement in therapy today completing 3 laps on nustep in 11 minutes compared to 12 minutes previous sessoin. Pt also with improved eccentric quad control with stand>sit with UA assist. Pt had to discontinue seated marches with 5# ankle weights due to increased R knee pain, but was able to complete exercise with 3# on R LE reporting decrease in R LE pain/sx. PT weightbearing heavily through UEs with // bar cone taps exercise. Pt also required cues for anterior weight shifts for balance activities with moderate carry-over. PT adjusted height of RW. Pt interested in trying rollator next therapy session. Pt still requires frequent cuing to increase step-length and foot clearance with gait. Pt will benefit from further skilled therapy to improve static and dynamic balance and gait mechanics.    Stability/Clinical Decision Making  Evolving/Moderate complexity    Clinical Decision Making  Moderate    Rehab Potential  Good    PT Frequency  2x / week    PT Duration  4 weeks    PT Treatment/Interventions  ADLs/Self Care Home Management;Therapeutic activities;Functional mobility training;Stair training;Gait training;Therapeutic exercise;Balance training;Neuromuscular re-education;Patient/family education;Manual techniques;Passive range of motion    PT Next Visit Plan  weight shifting  exercises, gait mechanics (decrease UE assist), new HEP and gait training    PT Home Exercise Plan  See handouts    Consulted and Agree with Plan of Care  Patient;Family member/caregiver  Family Member Consulted  spouse: [name]       Patient will benefit from skilled therapeutic intervention in order to improve the following deficits and impairments:  Abnormal gait, Decreased balance, Decreased endurance, Decreased mobility, Difficulty walking, Decreased range of motion, Decreased activity tolerance, Decreased strength, Impaired flexibility, Postural dysfunction, Pain  Visit Diagnosis: Muscle weakness (generalized)  Gait difficulty  Abnormal posture     Problem List Patient Active Problem List   Diagnosis Date Noted  . Lymphedema 04/15/2019  . Hyponatremia 12/24/2018  . SIADH (syndrome of inappropriate ADH production) ([place]) 12/24/2018  . Erosion of urethra due to catheterization of urinary tract ([place]) 10/27/2018  . Generalized weakness 10/14/2018  . Hip fracture ([place]) 09/15/2018  . 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 HLD (hyperlipidemia) 03/24/2018  . Contusion of right knee 02/14/2018  . Bradycardia 12/28/2017  . 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 Chronic pain of right knee 08/10/2016  . Right ankle pain 08/10/2016  . 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  . 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[place]) 12/11/2015  . Urinary retention 09/08/2015  . Lymphoma, 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[place]) 04/03/2015  . Breathlessness on exertion 11/21/2014  . Breath shortness 11/21/2014  . Arthropathy 11/07/2014  . Atrial flutter, paroxysmal ([place]) 11/07/2014  . Type 2 diabetes mellitus ([place]) 11/07/2014  . Benign essential tremor 11/07/2014  . Benign essential HTN 11/07/2014  . Mixed incontinence 11/07/2014  . Hypercholesterolemia without hypertriglyceridemia 11/07/2014  . Apnea,  sleep 11/07/2014  . Controlled type 2 diabetes mellitus without complication ([place]) 23/41/4436  . Pure hypercholesterolemia 11/07/2014  . Other abnormalities of gait and mobility 11/02/2011  . Abnormal gait 06/29/2011  . Discoordination 06/29/2011    Chinita Greenland, SPT 06/21/2019, 3:46 PM  [facility] [address] [place], [place], [address] Phone: [phone]   Fax:  [phone]  Name: Franklyn Cafaro MRN: [id number] Date of Birth: [birth year]

## 2019-06-25 ENCOUNTER — Other Ambulatory Visit: Payer: Self-pay

## 2019-06-25 ENCOUNTER — Encounter: Payer: Self-pay | Admitting: Physical Therapy

## 2019-06-25 ENCOUNTER — Ambulatory Visit: Payer: Medicare Other | Admitting: Physical Therapy

## 2019-06-25 DIAGNOSIS — R293 Abnormal posture: Secondary | ICD-10-CM

## 2019-06-25 DIAGNOSIS — R269 Unspecified abnormalities of gait and mobility: Secondary | ICD-10-CM

## 2019-06-25 DIAGNOSIS — M6281 Muscle weakness (generalized): Secondary | ICD-10-CM | POA: Diagnosis not present

## 2019-06-25 NOTE — Therapy (Signed)
[facility] [address] [place], [place], [address] Phone: [phone]   Fax:  [phone]  Physical Therapy Treatment  Patient Details  Name: Mike Holloway MRN: [id number] Date of Birth: [birth year] Referring Provider (PT): Dr. Rudene Christians   Encounter Date: 06/25/2019  PT End of Session - 06/25/19 1046    Visit Number  13    Number of Visits  15    Date for PT Re-Evaluation  07/04/19    Authorization - Visit Number  7    Authorization - Number of Visits  10    PT Start Time  2458    PT Stop Time  1155    PT Time Calculation (min)  72 min    Equipment Utilized During Treatment  Gait belt    Activity Tolerance  Patient tolerated treatment well;Patient limited by pain    Behavior During Therapy  [facility] for tasks assessed/performed       Past Medical History:  Diagnosis Date  . Arthritis   . Atrial flutter ([place])   . Diabetes mellitus ([place])   . Essential tremor   . Essential tremor    deep brain stimulator   . Hypercholesteremia   . Hypertension   . Incontinence   . 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[place])    grew on the testical  . Sleep apnea   . Stroke [facility])     Past Surgical History:  Procedure Laterality Date  . ABLATION    . APPENDECTOMY    . CATARACT EXTRACTION, BILATERAL    . COLONOSCOPY WITH PROPOFOL N/A 03/17/2018   Procedure: COLONOSCOPY WITH PROPOFOL;  Surgeon: Jonathon Bellows, MD;  Location: [facility] ENDOSCOPY;  Service: Gastroenterology;  Laterality: N/A;  . DEEP BRAIN STIMULATOR PLACEMENT    . 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 HEMORRHOID SURGERY    . HERNIA REPAIR    . HIP FRACTURE SURGERY    . INTRAMEDULLARY (IM) NAIL INTERTROCHANTERIC Right 09/16/2018   Procedure: INTRAMEDULLARY (IM) NAIL INTERTROCHANTRIC;  Surgeon: Hessie Knows, MD;  Location: [facility] ORS;  Service: Orthopedics;  Laterality:  Right;  . IR CATHETER TUBE CHANGE  11/22/2018  . NASAL SINUS SURGERY    . ORCHIECTOMY    . TONSILLECTOMY      There were no vitals filed for this visit.  Subjective Assessment - 06/25/19 1045    Subjective  No new complaints.  Pt. reports R knee is still hurting (no significant improvement since procedure).  Pt. brought in rollator today.    Patient Stated Goals  Improve LE strength/ gait and balance with daily tasks.  Improve gait/ safety/ progression to least assistive device.    Currently in Pain?  Yes    Pain Score  2     Pain Location  Knee    Pain Orientation  Right    Pain Descriptors / Indicators  Aching    Pain Type  Chronic pain         Therapeutic Exercise: CGA provided   Nustep, L4, 10 min Stand>sit eccentrics - 5x. (slow but controlled) Seated marches ex (Airex on chair to increase height): marching/ LAQ/ hip abd./ heel and toe raises (no wt. Today)- increase reps Standing marching/ lateral walking 3x each in //-bars (mirror feedback/ cuing).  Attempted step up at end of tx. But unable secondary to knee pain  Neuro:  Amb. In //-bars with lighter UE  assist working on consistent step pattern (recip.) Rollator use in clinic with recip. Step pattern but pt. Requires heavy UE assist (unable to decrease UE use) Turning in //-bars/ functional reaching/ BOS dynamic activites Car transfer with rollator (difficulty)- requires RW    PT Long Term Goals - 06/10/19 1346      PT LONG TERM GOAL #1   Title  Pt. independent with HEP to increase B hip flexion/ R quad muscle strength 1/2 muscle grade to improve pain-free mobility.    Baseline  Decrease B LE muscle strength: hip flexion 4/5 MMT, L quad 4+/5 MMT, R quad 4/5 MMT, B hamstring 5/5 MMT, B DF/PF 5/5 MMT. B UE AROM WFL and strength grossly 5/5 MMT except R shoulder flexion 4+/5 MMT.    Time  4    Period  Weeks    Status  Partially Met    Target Date  07/04/19      PT LONG TERM GOAL #2   Title  Pt. will increase  Berg balance test to >40 out of 56 to improve independence with gait/ decrease fall risk.    Baseline  Berg: 22/56 (significant fall risk)    Time  4    Period  Weeks    Status  On-going    Target Date  07/04/19      PT LONG TERM GOAL #3   Title  Pt. able to ambulate 100 feet with consistent 2 point gait pattern and use of least restrictive assistive devce to improve household mobility.    Baseline  Pt. able to ambulate with use of RW/ CGA to min. A for safety.   Heavy use of B UE.    Time  4    Period  Weeks    Status  Partially Met    Target Date  07/04/19      PT LONG TERM GOAL #4   Title  Pt. able to stand from normal chair with no UE assist to improve safety/independence with transfers.     Baseline  Unable to stand from standard chair without heavy UE assist.    Time  4    Period  Weeks    Status  Not Met    Target Date  07/04/19          Plan - 06/25/19 1047    Clinical Impression Statement  Pt. able to safely progress with use of rollator to increase gait cadence/ recip. step pattern.  Pt. continues to require heavy B UE assist with use of rollator on level surfaces.  Pt. unable to complete a step up on 6" step at end of tx. secondary to increase knee pain.  Persistent knee pain with all wt. bearing tasks and unable to progress to use of 1 UE assist in //-bars due to pain/ fear of knee pain.  PT recommends use of rollator with all walking inside house at this time to promote a more consistent gait pattern.  Pt. demonstrates ability to use seat on rollator with brakes safely.    Stability/Clinical Decision Making  Evolving/Moderate complexity    Clinical Decision Making  Moderate    Rehab Potential  Good    PT Frequency  2x / week    PT Duration  4 weeks    PT Treatment/Interventions  ADLs/Self Care Home Management;Therapeutic activities;Functional mobility training;Stair training;Gait training;Therapeutic exercise;Balance training;Neuromuscular re-education;Patient/family  education;Manual techniques;Passive range of motion    PT Next Visit Plan  weight shifting exercises, gait mechanics (decrease UE assist),  new HEP and gait training    PT Home Exercise Plan  See handouts    Consulted and Agree with Plan of Care  Patient;Family member/caregiver    Family Member Consulted  spouse: [name]       Patient will benefit from skilled therapeutic intervention in order to improve the following deficits and impairments:  Abnormal gait, Decreased balance, Decreased endurance, Decreased mobility, Difficulty walking, Decreased range of motion, Decreased activity tolerance, Decreased strength, Impaired flexibility, Postural dysfunction, Pain  Visit Diagnosis: Muscle weakness (generalized)  Gait difficulty  Abnormal posture     Problem List Patient Active Problem List   Diagnosis Date Noted  . Lymphedema 04/15/2019  . Hyponatremia 12/24/2018  . 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 Erosion of urethra due to catheterization of urinary tract ([place]) 10/27/2018  . Generalized weakness 10/14/2018  . Hip fracture ([place]) 09/15/2018  . 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 HLD (hyperlipidemia) 03/24/2018  . Contusion of right knee 02/14/2018  . Bradycardia 12/28/2017  . 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 Chronic pain of right knee 08/10/2016  . Right ankle pain 08/10/2016  . 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  . 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[place]) 12/11/2015  . Urinary retention 09/08/2015  . Lymphoma, 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[place]) 04/03/2015  . Breathlessness on exertion 11/21/2014  . Breath shortness 11/21/2014  . Arthropathy 11/07/2014  . Atrial flutter, paroxysmal ([place]) 11/07/2014  . Type 2 diabetes mellitus ([place]) 11/07/2014  . Benign essential tremor 11/07/2014  . Benign essential HTN 11/07/2014  . Mixed  incontinence 11/07/2014  . Hypercholesterolemia without hypertriglyceridemia 11/07/2014  . Apnea, sleep 11/07/2014  . Controlled type 2 diabetes mellitus without complication ([place]) 62/44/6950  . Pure hypercholesterolemia 11/07/2014  . Other abnormalities of gait and mobility 11/02/2011  . Abnormal gait 06/29/2011  . Discoordination 06/29/2011   Pura Spice, PT, DPT # [phone] 06/26/2019, 7:21 AM  [facility] [address] [place], [place], [address] Phone: [phone]   Fax:  [phone]  Name: Mike Holloway MRN: [id number] Date of Birth: Jan 31, 1941

## 2019-06-27 ENCOUNTER — Other Ambulatory Visit: Payer: Self-pay

## 2019-06-27 ENCOUNTER — Encounter: Payer: Self-pay | Admitting: Physical Therapy

## 2019-06-27 ENCOUNTER — Ambulatory Visit: Payer: Medicare Other | Admitting: Physical Therapy

## 2019-06-27 DIAGNOSIS — R293 Abnormal posture: Secondary | ICD-10-CM

## 2019-06-27 DIAGNOSIS — M6281 Muscle weakness (generalized): Secondary | ICD-10-CM

## 2019-06-27 DIAGNOSIS — R269 Unspecified abnormalities of gait and mobility: Secondary | ICD-10-CM

## 2019-06-27 NOTE — Patient Instructions (Signed)
Access Code: [email or web address]  URL: [email or web address]  Date: 06/27/2019  Prepared by: Dorcas Carrow   Exercises  Shoulder External Rotation and Scapular Retraction with Resistance - 20 reps - 2 sets - 1x daily - 4x weekly  Seated Shoulder Row with Anchored Resistance - 20 reps - 2 sets - 1x daily - 4x weekly  Seated Single Arm Elbow Flexion with Resistance - 20 reps - 2 sets - 1x daily - 4x weekly

## 2019-06-27 NOTE — Therapy (Signed)
[facility] [address] [place], [place], [address] Phone: [phone]   Fax:  [phone]  Physical Therapy Treatment  Patient Details  Name: Mike Holloway MRN: [id number] Date of Birth: 1941-03-12 Referring Provider (PT): Dr. Rudene Christians   Encounter Date: 06/27/2019  PT End of Session - 06/27/19 1426    Visit Number  14    Number of Visits  15    Date for PT Re-Evaluation  07/04/19    Authorization - Visit Number  8    Authorization - Number of Visits  10    PT Start Time  1324    PT Stop Time  1448    PT Time Calculation (min)  66 min    Equipment Utilized During Treatment  Gait belt    Activity Tolerance  Patient tolerated treatment well;Patient limited by pain    Behavior During Therapy  [facility] for tasks assessed/performed       Past Medical History:  Diagnosis Date  . Arthritis   . Atrial flutter ([place])   . Diabetes mellitus ([place])   . Essential tremor   . Essential tremor    deep brain stimulator   . Hypercholesteremia   . Hypertension   . Incontinence   . 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[place])    grew on the testical  . Sleep apnea   . Stroke [facility])     Past Surgical History:  Procedure Laterality Date  . ABLATION    . APPENDECTOMY    . CATARACT EXTRACTION, BILATERAL    . COLONOSCOPY WITH PROPOFOL N/A 03/17/2018   Procedure: COLONOSCOPY WITH PROPOFOL;  Surgeon: Jonathon Bellows, MD;  Location: [facility] ENDOSCOPY;  Service: Gastroenterology;  Laterality: N/A;  . DEEP BRAIN STIMULATOR PLACEMENT    . 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 HEMORRHOID SURGERY    . HERNIA REPAIR    . HIP FRACTURE SURGERY    . INTRAMEDULLARY (IM) NAIL INTERTROCHANTERIC Right 09/16/2018   Procedure: INTRAMEDULLARY (IM) NAIL INTERTROCHANTRIC;  Surgeon: Hessie Knows, MD;  Location: [facility] ORS;  Service: Orthopedics;  Laterality:  Right;  . IR CATHETER TUBE CHANGE  11/22/2018  . NASAL SINUS SURGERY    . ORCHIECTOMY    . TONSILLECTOMY      There were no vitals filed for this visit.  Subjective Assessment - 06/27/19 1421    Subjective  Pt. entered PT with use of rollator and SBA/CGA from PT into PT gym.  Pt. states R knee is hurting with wt. bearing/ standing activities.  Pts. wife reports pt. is not standing much at home.    Patient Stated Goals  Improve LE strength/ gait and balance with daily tasks.  Improve gait/ safety/ progression to least assistive device.    Currently in Pain?  Yes    Pain Score  3     Pain Location  Knee    Pain Orientation  Right         Therapeutic Exercise:CGA provided   Seated 4# LAQ/ marching/ heel and toe raises 30x Nustep L4, 10 min Standing marching/ lateral walking 3x each in //-bars (mirror feedback/ cuing)- increase R knee pain with marching (heavy UE assist).   Standing step touches (heavy UE assist due to R knee pain)- 13x Standing RTB UE ex. Program (see handouts)  Neuro:  Merrilee Jansky balance test: 32/56 (marked increase since initial evaluation) Sit to stands from  blue mat table (varying heights)- no rollator assist once standing. Amb. In //-bars with lighter UE assist working on consistent step pattern (recip.) Rollator use in clinic with recip. Step pattern but pt. Requires heavy UE assist (unable to decrease UE use) Car transfer with rollator (SBA/CGA)      PT Long Term Goals - 06/10/19 1346      PT LONG TERM GOAL #1   Title  Pt. independent with HEP to increase B hip flexion/ R quad muscle strength 1/2 muscle grade to improve pain-free mobility.    Baseline  Decrease B LE muscle strength: hip flexion 4/5 MMT, L quad 4+/5 MMT, R quad 4/5 MMT, B hamstring 5/5 MMT, B DF/PF 5/5 MMT. B UE AROM WFL and strength grossly 5/5 MMT except R shoulder flexion 4+/5 MMT.    Time  4    Period  Weeks    Status  Partially Met    Target Date  07/04/19      PT LONG TERM  GOAL #2   Title  Pt. will increase Berg balance test to >40 out of 56 to improve independence with gait/ decrease fall risk.    Baseline  Berg: 22/56 (significant fall risk)    Time  4    Period  Weeks    Status  On-going    Target Date  07/04/19      PT LONG TERM GOAL #3   Title  Pt. able to ambulate 100 feet with consistent 2 point gait pattern and use of least restrictive assistive devce to improve household mobility.    Baseline  Pt. able to ambulate with use of RW/ CGA to min. A for safety.   Heavy use of B UE.    Time  4    Period  Weeks    Status  Partially Met    Target Date  07/04/19      PT LONG TERM GOAL #4   Title  Pt. able to stand from normal chair with no UE assist to improve safety/independence with transfers.     Baseline  Unable to stand from standard chair without heavy UE assist.    Time  4    Period  Weeks    Status  Not Met    Target Date  07/04/19          Plan - 06/27/19 1426    Clinical Impression Statement  Pt. able to maneuver rollator to get in/out of van safely with SBA.  Pt. ambulates with a more reciprocal pattern while using rollator during tx. session.  Pt. able to tolerate standing for 2 minutes with no UE assist and improved R LE wt. bearing.  Difficulty with step touches/ higher levels balance on Berg primarily due to increase R knee pain with wt. bearing/ decrease UE assist.  Merrilee Jansky balance test: 32/56 (marked increase).  Issued UE ex. program with RTB.    Stability/Clinical Decision Making  Evolving/Moderate complexity    Clinical Decision Making  Moderate    Rehab Potential  Good    PT Frequency  2x / week    PT Duration  4 weeks    PT Treatment/Interventions  ADLs/Self Care Home Management;Therapeutic activities;Functional mobility training;Stair training;Gait training;Therapeutic exercise;Balance training;Neuromuscular re-education;Patient/family education;Manual techniques;Passive range of motion    PT Next Visit Plan  weight shifting  exercises, gait mechanics (decrease UE assist), REASSESS GOALS NEXT TX    PT Home Exercise Plan  See handouts    Consulted and Agree with Plan  of Care  Patient;Family member/caregiver    Family Member Consulted  spouse: [name]       Patient will benefit from skilled therapeutic intervention in order to improve the following deficits and impairments:  Abnormal gait, Decreased balance, Decreased endurance, Decreased mobility, Difficulty walking, Decreased range of motion, Decreased activity tolerance, Decreased strength, Impaired flexibility, Postural dysfunction, Pain  Visit Diagnosis: Muscle weakness (generalized)  Gait difficulty  Abnormal posture     Problem List Patient Active Problem List   Diagnosis Date Noted  . Lymphedema 04/15/2019  . Hyponatremia 12/24/2018  . SIADH (syndrome of inappropriate ADH production) ([place]) 12/24/2018  . Erosion of urethra due to catheterization of urinary tract ([place]) 10/27/2018  . Generalized weakness 10/14/2018  . Hip fracture ([place]) 09/15/2018  . 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 HLD (hyperlipidemia) 03/24/2018  . Contusion of right knee 02/14/2018  . Bradycardia 12/28/2017  . 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 Chronic pain of right knee 08/10/2016  . Right ankle pain 08/10/2016  . 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  . 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[place]) 12/11/2015  . Urinary retention 09/08/2015  . Lymphoma, 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[place]) 04/03/2015  . Breathlessness on exertion 11/21/2014  . Breath shortness 11/21/2014  . Arthropathy 11/07/2014  . Atrial flutter, paroxysmal ([place]) 11/07/2014  . Type 2 diabetes mellitus ([place]) 11/07/2014  . Benign essential tremor 11/07/2014  . Benign essential HTN 11/07/2014  . Mixed incontinence 11/07/2014  . Hypercholesterolemia without hypertriglyceridemia 11/07/2014  . Apnea,  sleep 11/07/2014  . Controlled type 2 diabetes mellitus without complication ([place]) 50/15/8682  . Pure hypercholesterolemia 11/07/2014  . Other abnormalities of gait and mobility 11/02/2011  . Abnormal gait 06/29/2011  . Discoordination 06/29/2011   Pura Spice, PT, DPT # [phone] 06/27/2019, 4:58 PM  [facility] [address] [place], [place], [address] Phone: [phone]   Fax:  [phone]  Name: Mike Holloway MRN: [id number] Date of Birth: 1941/09/01

## 2019-07-02 ENCOUNTER — Telehealth (INDEPENDENT_AMBULATORY_CARE_PROVIDER_SITE_OTHER): Payer: Self-pay

## 2019-07-02 ENCOUNTER — Other Ambulatory Visit: Payer: Self-pay

## 2019-07-02 ENCOUNTER — Encounter (INDEPENDENT_AMBULATORY_CARE_PROVIDER_SITE_OTHER): Payer: Self-pay | Admitting: Vascular Surgery

## 2019-07-02 ENCOUNTER — Ambulatory Visit (INDEPENDENT_AMBULATORY_CARE_PROVIDER_SITE_OTHER): Payer: Medicare Other

## 2019-07-02 ENCOUNTER — Ambulatory Visit (INDEPENDENT_AMBULATORY_CARE_PROVIDER_SITE_OTHER): Payer: Medicare Other | Admitting: Vascular Surgery

## 2019-07-02 VITALS — BP 138/76 | HR 65 | Resp 16 | Wt 208.0 lb

## 2019-07-02 DIAGNOSIS — I890 Lymphedema, not elsewhere classified: Secondary | ICD-10-CM

## 2019-07-02 DIAGNOSIS — I872 Venous insufficiency (chronic) (peripheral): Secondary | ICD-10-CM

## 2019-07-02 DIAGNOSIS — I10 Essential (primary) hypertension: Secondary | ICD-10-CM

## 2019-07-02 DIAGNOSIS — E119 Type 2 diabetes mellitus without complications: Secondary | ICD-10-CM

## 2019-07-02 DIAGNOSIS — I4892 Unspecified atrial flutter: Secondary | ICD-10-CM | POA: Diagnosis not present

## 2019-07-02 DIAGNOSIS — Z794 Long term (current) use of insulin: Secondary | ICD-10-CM

## 2019-07-02 NOTE — Telephone Encounter (Signed)
An e-mail was sent to [employer] requesting that for now they can put the Lymphedema pump on hold.

## 2019-07-02 NOTE — Progress Notes (Signed)
MRN : [id number]  Mike Holloway is a 78 y.o. (1940/12/15) male who presents with chief complaint of No chief complaint on file. [name]  History of Present Illness:   The patient returns to the office for followup evaluation regarding leg swelling.  The swelling has improved quite a bit and the pain associated with swelling has decreased substantially. There have not been any interval development of a ulcerations or wounds.  Since the previous visit the patient has been wearing graduated compression stockings and has noted little significant improvement in the lymphedema. The patient has been using compression routinely morning until night.  The patient also states elevation during the day and exercise is being done too.  Venous duplex shows both deep and superficial reflux bilaterally   No outpatient medications have been marked as taking for the 07/02/19 encounter (Office Visit) with Delana Meyer, Dolores Lory, MD.    Past Medical History:  Diagnosis Date  . Arthritis   . Atrial flutter ([place])   . Diabetes mellitus ([place])   . Essential tremor   . Essential tremor    deep brain stimulator   . Hypercholesteremia   . Hypertension   . Incontinence   . 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 Sleep apnea   . Stroke [facility])     Past Surgical History:  Procedure Laterality Date  . ABLATION    . APPENDECTOMY    . CATARACT EXTRACTION, BILATERAL    . COLONOSCOPY WITH PROPOFOL N/A 03/17/2018   Procedure: COLONOSCOPY WITH PROPOFOL;  Surgeon: Jonathon Bellows, MD;  Location[facility] ENDOSCOPY;  Service: Gastroenterology;  Laterality: N/A;  . DEEP BRAIN STIMULATOR PLACEMENT    . 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 HEMORRHOID SURGERY    . HERNIA REPAIR    . HIP FRACTURE SURGERY    . INTRAMEDULLARY (IM) NAIL INTERTROCHANTERIC Right 09/16/2018   Procedure:  INTRAMEDULLARY (IM) NAIL INTERTROCHANTRIC;  Surgeon: Hessie Knows, MD;  Location: [facility] ORS;  Service: Orthopedics;  Laterality: Right;  . IR CATHETER TUBE CHANGE  11/22/2018  . NASAL SINUS SURGERY    . ORCHIECTOMY    . TONSILLECTOMY      Social History Social History   Tobacco Use  . Smoking status: Never Smoker  . Smokeless tobacco: Never Used  Substance Use Topics  . Alcohol use: No    Alcohol/week: 0.0 standard drinks  . Drug use: No    Family History Family History  Problem Relation Age of Onset  . Hematuria Father   . Prostate cancer Father   . Diabetes Father   . Heart disease Mother   . Diabetes Mother   . Kidney disease Neg Hx   . Bladder Cancer Neg Hx     Allergies  Allergen Reactions  . Clindamycin Diarrhea    Contracted C. Diff x 2  . Flagyl [Metronidazole] Swelling    Swollen tongue, excessive shaking,    . Ambien  [Zolpidem]     Other reaction(s): Other (See Comments) Disoriented and moody Other reaction(s): Other (See Comments) Disoriented and moody  . Penicillins Rash    Has patient had a PCN reaction causing immediate rash, facial/tongue/throat swelling, SOB or lightheadedness with hypotension: Unknown Has patient had a PCN reaction causing severe rash involving mucus membranes or skin necrosis: Unknown Has patient had a PCN reaction that required hospitalization: No Has patient  had a PCN reaction occurring within the last 10 years: No If all of the above answers are "NO", then may proceed with Cephalosporin use.  . Sulfa Antibiotics Rash     REVIEW OF SYSTEMS (Negative unless checked)  Constitutional: [] Weight loss  [] Fever  [] Chills Cardiac: [] Chest pain   [] Chest pressure   [] Palpitations   [] Shortness of breath when laying flat   [] Shortness of breath with exertion. Vascular:  [] Pain in legs with walking   [] Pain in legs at rest  [] History of DVT   [] Phlebitis   [x] Swelling in legs   [] Varicose veins   [] Non-healing ulcers Pulmonary:    [] Uses home oxygen   [] Productive cough   [] Hemoptysis   [] Wheeze  [] COPD   [] Asthma Neurologic:  [] Dizziness   [] Seizures   [] History of stroke   [] History of TIA  [] Aphasia   [] Vissual changes   [] Weakness or numbness in arm   [] Weakness or numbness in leg Musculoskeletal:   [] Joint swelling   [] Joint pain   [] Low back pain Hematologic:  [] Easy bruising  [] Easy bleeding   [] Hypercoagulable state   [] Anemic Gastrointestinal:  [] Diarrhea   [] Vomiting  [] Gastroesophageal reflux/heartburn   [] Difficulty swallowing. Genitourinary:  [] Chronic kidney disease   [] Difficult urination  [] Frequent urination   [] Blood in urine Skin:  [] Rashes   [] Ulcers  Psychological:  [] History of anxiety   []  History of major depression.  Physical Examination  Vitals:   07/02/19 1435  BP: 138/76  Pulse: 65  Resp: 16  Weight: 208 lb (94.3 kg)   Body mass index is 30.94 kg/m. Gen: WD/WN, NAD Head: [place]/AT, No temporalis wasting.  Ear/Nose/Throat: Hearing grossly intact, nares w/o erythema or drainage Eyes: PER, EOMI, sclera nonicteric.  Neck: Supple, no large masses.   Pulmonary:  Good air movement, no audible wheezing bilaterally, no use of accessory muscles.  Cardiac: RRR, no JVD Vascular: scattered varicosities present bilaterally.  Mild venous stasis changes to the legs bilaterally.  2+ soft pitting edema today Gastrointestinal: Non-distended. No guarding/no peritoneal signs.  Musculoskeletal: M/S 5/5 throughout.  No deformity or atrophy.  Neurologic: CN 2-12 intact. Symmetrical.  Speech is fluent. Motor exam as listed above. Psychiatric: Judgment intact, Mood & affect appropriate for pt's clinical situation. Dermatologic: Mild venous  rashes no ulcers noted.  No changes consistent with cellulitis. Lymph : No lichenification or skin changes of chronic lymphedema.  CBC Lab Results  Component Value Date   WBC 6.9 11/13/2018   HGB 11.6 (L) 11/13/2018   HCT 34.7 (L) 11/13/2018   MCV 98.9 11/13/2018    PLT 141 (L) 11/13/2018    BMET    Component Value Date/Time   NA 130 (L) 11/13/2018 1331   K 4.3 11/13/2018 1331   CL 99 11/13/2018 1331   CO2 24 11/13/2018 1331   GLUCOSE 113 (H) 11/13/2018 1331   BUN 17 11/13/2018 1331   CREATININE 1.01 11/13/2018 1331   CALCIUM 9.5 11/13/2018 1331   GFRNONAA >60 11/13/2018 1331   GFRAA >60 11/13/2018 1331   CrCl cannot be calculated (Patient's most recent lab result is older than the maximum 21 days allowed.).  COAG Lab Results  Component Value Date   INR 0.99 09/15/2018    Radiology No results found.   Assessment/Plan 1. Lymphedema No surgery or intervention at this point in time.    I have reviewed my discussion with the patient regarding venous insufficiency and secondary lymph edema and why it  causes symptoms. I have discussed with  the patient the chronic skin changes that accompany these problems and the long term sequela such as ulceration and infection.  Patient will continue wearing graduated compression stockings class 1 (20-30 mmHg) on a daily basis a prescription was given to the patient to keep this updated. The patient will  put the stockings on first thing in the morning and removing them in the evening. The patient is instructed specifically not to sleep in the stockings.  In addition, behavioral modification including elevation during the day will be continued.  Diet and salt restriction was also discussed.  Previous duplex ultrasound of the lower extremities shows normal deep venous system, superficial reflux was not present.   Following the review of the ultrasound the patient will follow up in 12 months to reassess the degree of swelling and the control that graduated compression is offering.   The patient can be assessed for a Lymph Pump at that time.  However, at this time the patient states they are satisfied with the control compression and elevation is yielding.    2. Chronic venous insufficiency No surgery or  intervention at this point in time.    I have reviewed my discussion with the patient regarding venous insufficiency and secondary lymph edema and why it  causes symptoms. I have discussed with the patient the chronic skin changes that accompany these problems and the long term sequela such as ulceration and infection.  Patient will continue wearing graduated compression stockings class 1 (20-30 mmHg) on a daily basis a prescription was given to the patient to keep this updated. The patient will  put the stockings on first thing in the morning and removing them in the evening. The patient is instructed specifically not to sleep in the stockings.  In addition, behavioral modification including elevation during the day will be continued.  Diet and salt restriction was also discussed.  Previous duplex ultrasound of the lower extremities shows normal deep venous system, superficial reflux was not present.   Following the review of the ultrasound the patient will follow up in 12 months to reassess the degree of swelling and the control that graduated compression is offering.   The patient can be assessed for a Lymph Pump at that time.  However, at this time the patient states they are satisfied with the control compression and elevation is yielding.    3. Benign essential HTN Continue antihypertensive medications as already ordered, these medications have been reviewed and there are no changes at this time.   4. Atrial flutter, paroxysmal (HCC) Continue antiarrhythmia medications as already ordered, these medications have been reviewed and there are no changes at this time.  Continue anticoagulation as ordered by Cardiology Service   5. Type 2 diabetes mellitus without complication, with long-term current use of insulin (HCC) Continue hypoglycemic medications as already ordered, these medications have been reviewed and there are no changes at this time.  Hgb A1C to be monitored as already arranged  by primary service     Hortencia Pilar, MD  07/02/2019 2:36 PM

## 2019-07-03 ENCOUNTER — Ambulatory Visit: Payer: Medicare Other | Attending: Orthopedic Surgery

## 2019-07-03 ENCOUNTER — Encounter: Payer: Self-pay | Admitting: Physical Therapy

## 2019-07-03 DIAGNOSIS — M6281 Muscle weakness (generalized): Secondary | ICD-10-CM | POA: Insufficient documentation

## 2019-07-03 DIAGNOSIS — R293 Abnormal posture: Secondary | ICD-10-CM | POA: Diagnosis present

## 2019-07-03 DIAGNOSIS — R269 Unspecified abnormalities of gait and mobility: Secondary | ICD-10-CM | POA: Diagnosis present

## 2019-07-03 NOTE — Therapy (Signed)
[place] [facility] [address] [place], [place], [address] Phone: [phone]   Fax:  [phone]  Physical Therapy Treatment  Patient Details  Name: Mike Holloway MRN: [id number] Date of Birth: [birth year] Referring Provider (PT): Dr. Rudene Christians   Encounter Date: 07/03/2019  PT End of Session - 07/03/19 1354    Visit Number  15    Number of Visits  15    Date for PT Re-Evaluation  08/28/19    Authorization - Visit Number  9    Authorization - Number of Visits  10    PT Start Time  [id number]    PT Stop Time  1440    PT Time Calculation (min)  55 min    Equipment Utilized During Treatment  Gait belt    Activity Tolerance  Patient tolerated treatment well;Patient limited by pain    Behavior During Therapy  [facility] for tasks assessed/performed       Past Medical History:  Diagnosis Date  . Arthritis   . Atrial flutter ([place])   . Diabetes mellitus ([place])   . Essential tremor   . Essential tremor    deep brain stimulator   . Hypercholesteremia   . Hypertension   . Incontinence   . 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 Sleep apnea   . Stroke [facility])     Past Surgical History:  Procedure Laterality Date  . ABLATION    . APPENDECTOMY    . CATARACT EXTRACTION, BILATERAL    . COLONOSCOPY WITH PROPOFOL N/A 03/17/2018   Procedure: COLONOSCOPY WITH PROPOFOL;  Surgeon: Jonathon Bellows, MD;  Location: [facility] ENDOSCOPY;  Service: Gastroenterology;  Laterality: N/A;  . DEEP BRAIN STIMULATOR PLACEMENT    . 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 HEMORRHOID SURGERY    . HERNIA REPAIR    . HIP FRACTURE SURGERY    . INTRAMEDULLARY (IM) NAIL INTERTROCHANTERIC Right 09/16/2018   Procedure: INTRAMEDULLARY (IM) NAIL INTERTROCHANTRIC;  Surgeon: Hessie Knows, MD;  Location: [facility] ORS;  Service: Orthopedics;  Laterality:  Right;  . IR CATHETER TUBE CHANGE  11/22/2018  . NASAL SINUS SURGERY    . ORCHIECTOMY    . TONSILLECTOMY      There were no vitals filed for this visit.  Subjective Assessment - 07/03/19 1353    Subjective  Patient reported R knee pain 2/10 after putting on biofreeze, it was a 3 prior to that. He also has had tylenol today, and put on biofreeze on earlier today as well.    Patient is accompained by:  Family member    Limitations  Lifting;Standing;Walking;House hold activities    Patient Stated Goals  Improve LE strength/ gait and balance with daily tasks.  Improve gait/ safety/ progression to least assistive device.    Currently in Pain?  Yes    Pain Score  2     Pain Location  Knee    Pain Orientation  Right    Pain Descriptors / Indicators  Aching    Pain Type  Chronic pain    Pain Onset  More than a month ago       Objective: LE strength R/L 4-/4 hip flexion 4+*/5 Knee extension 4*/5 Knee flexion 5/5 Ankle dorsiflexion 5/5 Ankle plantar flexion in sitting resisting PT motions 5/5 Hip abduction in sitting 4+/5 Hip adduction in sitting *pain  with movement  BERG: 32/56 (performed last session)  TREATMENT:  Therapeutic exercise: CGA provided throughout for safety, pt with intermittent complaints of R knee pain  Nustep L4, 10 min, STM to UTs due to increased muscle tension/tenderness from UE work during ambulation Sit <> stand from standard chair x4 attempts without UE Sit <> Stand with blue foam on standard chair x3 attempts Sit <> stand from blue foam on standard chair x5 reps with unilateral UE support Seated 5# LAQ/ marching/ heel and toe raises x15 in circuit exercise, and then 10 reps  Also discussed sleeping positions, HEP importance and performing consistently  Neuro: Standing weight shifts without UE support (lateral shifting, verbal cues to increase weight shift) Rollator use in clinic  laps with recip. step pattern but pt. requires heavy UE assist (unable  to decrease UE use) Car transfer with rollator (SBA)       PT Education - 07/03/19 1354    Education Details  Pt educated on therapeutic exercise form and gait mechanics, as well as importance of HEP    Person(s) Educated  Patient    Methods  Explanation;Demonstration;Handout    Comprehension  Verbalized understanding;Returned demonstration          PT Long Term Goals - 07/03/19 1356      PT LONG TERM GOAL #1   Title  Pt. independent with HEP to increase B hip flexion/ R quad muscle strength 1/2 muscle grade to improve pain-free mobility.    Baseline  Decrease B LE muscle strength: hip flexion 4/5 MMT, L quad 4+/5 MMT, R quad 4/5 MMT, B hamstring 5/5 MMT, B DF/PF 5/5 MMT. B UE AROM WFL and strength grossly 5/5 MMT except R shoulder flexion 4+/5 MMT; hip flexion R/l: 4-/4, knee extension 4+/5, ankle PF/DF 5/5, hip abduction 5/5, hip adduction 4+/5 and poor compliance with HEP    Time  8    Period  Weeks    Status  On-going    Target Date  08/28/19      PT LONG TERM GOAL #2   Title  Pt. will increase Berg balance test to >40 out of 56 to improve independence with gait/ decrease fall risk.    Baseline  Berg: 22/56 (significant fall risk); 9/30 32/56    Time  8    Period  Weeks    Status  On-going    Target Date  08/28/19      PT LONG TERM GOAL #3   Title  Pt. able to ambulate 100 feet with consistent 2 point gait pattern and use of least restrictive assistive devce to improve household mobility.    Baseline  Pt. able to ambulate with use of RW/ CGA to min. A for safety.   Heavy use of B UE.; 9/30 pt able to ambulate with rollator with inconsistent 2 point gait pattern, heavy reliance on UE CGA/supervision    Time  8    Period  Weeks    Status  On-going    Target Date  08/28/19      PT LONG TERM GOAL #4   Title  Pt. able to stand from normal chair with no UE assist to improve safety/independence with transfers.     Baseline  Unable to stand from standard chair without heavy  UE assist. 10/6, pt unable to stand from standard chair without UE support    Time  8    Period  Weeks    Status  On-going    Target Date  08/28/19            Plan - 07/03/19 1355    Clinical Impression Statement  Pt LE MMT reveals LE grossly 5/5 except for hip flexion (R: 4-/5, L: 4/5), knee extension (R: 4+ with pain), knee flexion (R: 4/5 with pain), and hip adduction (R: 4+/5).  Pt unable to sit<>stand from standard chair without UE support or unilateral UE support; able to perform modified sit<>stand with blue foam on standard chair with unilateral UE support.  Pt able to perform lateral weight shifting to 50-60% WB on R with no increase in R knee pain (2/10); ceased activity due to LBP.  Pt ambulates in clinic with rollator with reciprocal step patten, requires heavy UE assist.  Pt educated on importance of LE exercise and AROM at home. The patient demonstrated mild progression towards goals this session and last session. The ptt will benefit from further skilled therapy to improve gait mechanics with LRD and increase LE strength as well as improve safety at home.    Stability/Clinical Decision Making  Evolving/Moderate complexity    Clinical Decision Making  Moderate    Rehab Potential  Good    PT Frequency  2x / week    PT Duration  8 weeks    PT Treatment/Interventions  ADLs/Self Care Home Management;Therapeutic activities;Functional mobility training;Stair training;Gait training;Therapeutic exercise;Balance training;Neuromuscular re-education;Patient/family education;Manual techniques;Passive range of motion    PT Next Visit Plan  weight shifting exercises, gait mechanics (decrease UE assist)    PT Home Exercise Plan  See handouts    Consulted and Agree with Plan of Care  Patient;Family member/caregiver    Family Member Consulted  spouse: [name]       Patient will benefit from skilled therapeutic intervention in order to improve the following deficits and impairments:  Abnormal  gait, Decreased balance, Decreased endurance, Decreased mobility, Difficulty walking, Decreased range of motion, Decreased activity tolerance, Decreased strength, Impaired flexibility, Postural dysfunction, Pain  Visit Diagnosis: Muscle weakness (generalized)  Gait difficulty  Abnormal posture     Problem List Patient Active Problem List   Diagnosis Date Noted  . Lymphedema 04/15/2019  . Bilateral leg edema 01/25/2019  . Hyponatremia 12/24/2018  . SIADH (syndrome of inappropriate ADH production) ([place]) 12/24/2018  . Erosion of urethra due to catheterization of urinary tract ([place]) 10/27/2018  . Generalized weakness 10/14/2018  . Hip fracture ([place]) 09/15/2018  . 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 HLD (hyperlipidemia) 03/24/2018  . Contusion of right knee 02/14/2018  . Bradycardia 12/28/2017  . 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 Chronic pain of right knee 08/10/2016  . Right ankle pain 08/10/2016  . 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  . 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[place]) 12/11/2015  . Urinary retention 09/08/2015  . Lymphoma, 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[place]) 04/03/2015  . Breathlessness on exertion 11/21/2014  . Breath shortness 11/21/2014  . Arthropathy 11/07/2014  . Atrial flutter, paroxysmal ([place]) 11/07/2014  . Type 2 diabetes mellitus ([place]) 11/07/2014  . Benign essential tremor 11/07/2014  . Benign essential HTN 11/07/2014  . Mixed incontinence 11/07/2014  . Hypercholesterolemia without hypertriglyceridemia 11/07/2014  . Apnea, sleep 11/07/2014  . Controlled type 2 diabetes mellitus without complication ([place]) 0000000  . Pure hypercholesterolemia 11/07/2014  . Other abnormalities of gait and mobility 11/02/2011  . Decreased mobility 11/02/2011  . Abnormal gait 06/29/2011  . Discoordination 06/29/2011    Chinita Greenland, SPT  [place]  [facility] [address] [place], [place], [address] Phone: [phone]   Fax:  [phone]  Name: Mike Holloway MRN: [id number] Date of Birth: May 31, 1941

## 2019-07-05 ENCOUNTER — Ambulatory Visit: Payer: Medicare Other | Admitting: Physical Therapy

## 2019-07-05 ENCOUNTER — Encounter: Payer: Self-pay | Admitting: Physical Therapy

## 2019-07-05 ENCOUNTER — Other Ambulatory Visit: Payer: Self-pay

## 2019-07-05 DIAGNOSIS — M6281 Muscle weakness (generalized): Secondary | ICD-10-CM | POA: Diagnosis not present

## 2019-07-05 DIAGNOSIS — R269 Unspecified abnormalities of gait and mobility: Secondary | ICD-10-CM

## 2019-07-05 DIAGNOSIS — R293 Abnormal posture: Secondary | ICD-10-CM

## 2019-07-05 NOTE — Therapy (Signed)
[facility] [address] [place], [place], [address] Phone: [phone]   Fax:  [phone]  Physical Therapy Treatment  Patient Details  Name: Mike Holloway MRN: [id number] Date of Birth: 02-27-1941 Referring Provider (PT): Dr. Rudene Christians   Encounter Date: 07/05/2019  PT End of Session - 07/05/19 1507    Visit Number  16    Number of Visits  24    Date for PT Re-Evaluation  08/02/19    Authorization - Visit Number  1    Authorization - Number of Visits  10    PT Start Time  1350    PT Stop Time  [id number]    PT Time Calculation (min)  59 min    Equipment Utilized During Treatment  Gait belt    Activity Tolerance  Patient tolerated treatment well;Patient limited by pain    Behavior During Therapy  [facility] for tasks assessed/performed       Past Medical History:  Diagnosis Date  . Arthritis   . Atrial flutter ([place])   . Diabetes mellitus ([place])   . Essential tremor   . Essential tremor    deep brain stimulator   . Hypercholesteremia   . Hypertension   . Incontinence   . 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  . Sleep apnea   . Stroke [facility])     Past Surgical History:  Procedure Laterality Date  . ABLATION    . APPENDECTOMY    . CATARACT EXTRACTION, BILATERAL    . COLONOSCOPY WITH PROPOFOL N/A 03/17/2018   Procedure: COLONOSCOPY WITH PROPOFOL;  Surgeon: Jonathon Bellows, MD;  Location: [facility] ENDOSCOPY;  Service: Gastroenterology;  Laterality: N/A;  . DEEP BRAIN STIMULATOR PLACEMENT    . 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 HEMORRHOID SURGERY    . HERNIA REPAIR    . HIP FRACTURE SURGERY    . INTRAMEDULLARY (IM) NAIL INTERTROCHANTERIC Right 09/16/2018   Procedure: INTRAMEDULLARY (IM) NAIL INTERTROCHANTRIC;  Surgeon: Hessie Knows, MD;  Location: [facility] ORS;  Service: Orthopedics;  Laterality:  Right;  . IR CATHETER TUBE CHANGE  11/22/2018  . NASAL SINUS SURGERY    . ORCHIECTOMY    . TONSILLECTOMY      There were no vitals filed for this visit.  Subjective Assessment - 07/05/19 1428    Subjective  Pt arrived to PT with nose bleed that resolved prior to entering the facility. Pt reports 1/10 pain in knee and that he put on biofreeze and took tylenol prior to PT today.    Patient is accompained by:  Family member    Limitations  Lifting;Standing;Walking;House hold activities    Patient Stated Goals  Improve LE strength/ gait and balance with daily tasks.  Improve gait/ safety/ progression to least assistive device.    Currently in Pain?  Yes    Pain Score  1     Pain Location  Knee    Pain Orientation  Right    Pain Descriptors / Indicators  Aching    Pain Type  Chronic pain    Pain Onset  More than a month ago        Neuro: Weight shifting onto R leg in // bars with PT blocking R knee ant/post for security - 3x8 reps with 10 sec hold Pre gait weight shifting onto R leg with L step  forward/ backward in // bars with PT blocking R knee ant/post for security - 2x8 each Weight shifting on R leg on foam in // bars with PT blocking R knee for security - 2x8 reps with 10 sec hold Gait in clinic with emphasis on weight shifting and large steps  Therapeutic Exercise: Seated marching/ LAQ/ heel raises/ toe raises with 5# ankle weights - 2x20 each No Scifit today Reviewed HEP  [facility] PT Assessment - 07/05/19 0001      Assessment   Medical Diagnosis  S/p R subtrochanteric hip fracture, S/p R ORIF fracture of hip.      Referring Provider (PT)  Dr. Rudene Christians    Onset Date/Surgical Date  09/15/18    Prior Therapy  Yes, known well to PT      Prior Function   Level of Independence  Needs assistance with gait      Cognition   Overall Cognitive Status  Within Functional Limits for tasks assessed        PT Long Term Goals - 07/05/19 1521      PT LONG TERM GOAL #1   Title  Pt.  independent with HEP to increase B hip flexion/ R quad muscle strength 1/2 muscle grade to improve pain-free mobility.    Baseline  Decrease B LE muscle strength: hip flexion 4/5 MMT, L quad 4+/5 MMT, R quad 4/5 MMT, B hamstring 5/5 MMT, B DF/PF 5/5 MMT. B UE AROM WFL and strength grossly 5/5 MMT except R shoulder flexion 4+/5 MMT; hip flexion R/l: 4-/4, knee extension 4+/5, ankle PF/DF 5/5, hip abduction 5/5, hip adduction 4+/5 and poor compliance with HEP    Time  4    Period  Weeks    Status  On-going    Target Date  08/02/19      PT LONG TERM GOAL #2   Title  Pt. will increase Berg balance test to >40 out of 56 to improve independence with gait/ decrease fall risk.    Baseline  Berg: 22/56 (significant fall risk); 9/30 32/56    Time  4    Period  Weeks    Status  On-going    Target Date  08/02/19      PT LONG TERM GOAL #3   Title  Pt. able to ambulate 100 feet with consistent 2 point gait pattern and use of least restrictive assistive devce to improve household mobility.    Baseline  Pt. able to ambulate with use of RW/ CGA to min. A for safety.   Heavy use of B UE.; 9/30 pt able to ambulate with rollator with inconsistent 2 point gait pattern, heavy reliance on UE CGA/supervision    Time  8    Period  Weeks    Status  On-going      PT LONG TERM GOAL #4   Title  Pt. able to stand from normal chair with no UE assist to improve safety/independence with transfers.     Baseline  Unable to stand from standard chair without heavy UE assist. 10/6, pt unable to stand from standard chair without UE support    Time  4    Period  Weeks    Status  On-going    Target Date  08/02/19          Plan - 07/05/19 1511    Clinical Impression Statement  Pt able to weight shift onto R leg on stable surface and foam with unilateral UE support with PT blocking R knee  for security; pt reports fatigue and LBP with >8 reps of 10 second holds.  Pt unable to perform L step with full WB on R leg with  pregait stepping forward/ backward with PT R knee block; uses B UE WB support and requires cueing for decrease UE WB and posture.  Pt LBP and knee pain increases with prolonged R LE WB; R knee pain resolves out of position.  Pt reports feeling as if he is weight bearing on UE less than usual.  Pt will benefit from further skilled therapy to improve gait mechanics with LRD, increase LE strength, and improve safety at home.    Stability/Clinical Decision Making  Evolving/Moderate complexity    Clinical Decision Making  Moderate    Rehab Potential  Good    PT Frequency  2x / week    PT Duration  8 weeks    PT Treatment/Interventions  ADLs/Self Care Home Management;Therapeutic activities;Functional mobility training;Stair training;Gait training;Therapeutic exercise;Balance training;Neuromuscular re-education;Patient/family education;Manual techniques;Passive range of motion    PT Next Visit Plan  weight shifting exercises, gait mechanics (decrease UE assist), LE strengthening/ endurance    PT Home Exercise Plan  See handouts    Consulted and Agree with Plan of Care  Patient;Family member/caregiver    Family Member Consulted  spouse: [name]       Patient will benefit from skilled therapeutic intervention in order to improve the following deficits and impairments:  Abnormal gait, Decreased balance, Decreased endurance, Decreased mobility, Difficulty walking, Decreased range of motion, Decreased activity tolerance, Decreased strength, Impaired flexibility, Postural dysfunction, Pain  Visit Diagnosis: Muscle weakness (generalized)  Gait difficulty  Abnormal posture     Problem List Patient Active Problem List   Diagnosis Date Noted  . Lymphedema 04/15/2019  . Bilateral leg edema 01/25/2019  . Hyponatremia 12/24/2018  . SIADH (syndrome of inappropriate ADH production) ([place]) 12/24/2018  . Erosion of urethra due to catheterization of urinary tract ([place]) 10/27/2018  . Generalized weakness  10/14/2018  . Hip fracture ([place]) 09/15/2018  . 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 HLD (hyperlipidemia) 03/24/2018  . Contusion of right knee 02/14/2018  . Bradycardia 12/28/2017  . 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 Chronic pain of right knee 08/10/2016  . Right ankle pain 08/10/2016  . 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  . 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[place]) 12/11/2015  . Urinary retention 09/08/2015  . Lymphoma, 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[place]) 04/03/2015  . Breathlessness on exertion 11/21/2014  . Breath shortness 11/21/2014  . Arthropathy 11/07/2014  . Atrial flutter, paroxysmal ([place]) 11/07/2014  . Type 2 diabetes mellitus ([place]) 11/07/2014  . Benign essential tremor 11/07/2014  . Benign essential HTN 11/07/2014  . Mixed incontinence 11/07/2014  . Hypercholesterolemia without hypertriglyceridemia 11/07/2014  . Apnea, sleep 11/07/2014  . Controlled type 2 diabetes mellitus without complication ([place]) 0000000  . Pure hypercholesterolemia 11/07/2014  . Other abnormalities of gait and mobility 11/02/2011  . Decreased mobility 11/02/2011  . Abnormal gait 06/29/2011  . Discoordination 06/29/2011   Pura Spice, PT, DPT # [id number] Chinita Greenland, SPT 07/05/2019, 6:18 PM  [place] [facility] [address] [place], [place], [address] Phone: [phone]   Fax:  [phone]  Name: Mike Holloway MRN: [id number] Date of Birth: [birth year]

## 2019-07-06 ENCOUNTER — Encounter: Payer: Self-pay | Admitting: Student in an Organized Health Care Education/Training Program

## 2019-07-09 ENCOUNTER — Other Ambulatory Visit: Payer: Self-pay

## 2019-07-09 ENCOUNTER — Encounter: Payer: Self-pay | Admitting: Student in an Organized Health Care Education/Training Program

## 2019-07-09 ENCOUNTER — Ambulatory Visit: Payer: Medicare Other | Admitting: Physical Therapy

## 2019-07-09 ENCOUNTER — Encounter: Payer: Self-pay | Admitting: Physical Therapy

## 2019-07-09 ENCOUNTER — Ambulatory Visit
Payer: Medicare Other | Attending: Student in an Organized Health Care Education/Training Program | Admitting: Student in an Organized Health Care Education/Training Program

## 2019-07-09 DIAGNOSIS — M6281 Muscle weakness (generalized): Secondary | ICD-10-CM | POA: Diagnosis not present

## 2019-07-09 DIAGNOSIS — M1711 Unilateral primary osteoarthritis, right knee: Secondary | ICD-10-CM | POA: Diagnosis not present

## 2019-07-09 DIAGNOSIS — Z96651 Presence of right artificial knee joint: Secondary | ICD-10-CM | POA: Diagnosis not present

## 2019-07-09 DIAGNOSIS — R269 Unspecified abnormalities of gait and mobility: Secondary | ICD-10-CM

## 2019-07-09 DIAGNOSIS — R293 Abnormal posture: Secondary | ICD-10-CM

## 2019-07-09 NOTE — Therapy (Signed)
[facility] [address] [place], [place], [address] Phone: [phone]   Fax:  [phone]  Physical Therapy Treatment  Patient Details  Name: Mike Holloway MRN: [id number] Date of Birth: [birth year] Referring Provider (PT): Dr. Rudene Christians   Encounter Date: 07/09/2019  PT End of Session - 07/10/19 1535    Visit Number  17    Number of Visits  24    Date for PT Re-Evaluation  08/02/19    Authorization - Visit Number  2    Authorization - Number of Visits  10    PT Start Time  [id number]    PT Stop Time  1137    PT Time Calculation (min)  57 min    Equipment Utilized During Treatment  Gait belt    Activity Tolerance  Patient tolerated treatment well;Patient limited by pain    Behavior During Therapy  [facility] for tasks assessed/performed       Past Medical History:  Diagnosis Date  . Arthritis   . Atrial flutter ([place])   . Diabetes mellitus ([place])   . Essential tremor   . Essential tremor    deep brain stimulator   . Hypercholesteremia   . Hypertension   . Incontinence   . 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 Sleep apnea   . Stroke [facility])     Past Surgical History:  Procedure Laterality Date  . ABLATION    . APPENDECTOMY    . CATARACT EXTRACTION, BILATERAL    . COLONOSCOPY WITH PROPOFOL N/A 03/17/2018   Procedure: COLONOSCOPY WITH PROPOFOL;  Surgeon: Jonathon Bellows, MD;  Location: [facility] ENDOSCOPY;  Service: Gastroenterology;  Laterality: N/A;  . DEEP BRAIN STIMULATOR PLACEMENT    . 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 HEMORRHOID SURGERY    . HERNIA REPAIR    . HIP FRACTURE SURGERY    . INTRAMEDULLARY (IM) NAIL INTERTROCHANTERIC Right 09/16/2018   Procedure: INTRAMEDULLARY (IM) NAIL INTERTROCHANTRIC;  Surgeon: Hessie Knows, MD;  Location: [facility] ORS;  Service: Orthopedics;  Laterality:  Right;  . IR CATHETER TUBE CHANGE  11/22/2018  . NASAL SINUS SURGERY    . ORCHIECTOMY    . TONSILLECTOMY      There were no vitals filed for this visit.    Pt reports 1/10 pain in R knee; put on biofreeze prior to PT today. Pt reports no back pain. Pt reports doing HEP at home. Pt reports feeling "safe" with rollator.    Therapeutic Exercise: Seated marching, LAQ, heel/toe raises with 5# ankle weights - 2x20 Standing 4-way hip (no wt.) working on decrease UE assist in //-bars. Sit to stands 5x2 Nustep L5 10 min. B UE/LE (consistent cadence)   Neuro: Weight shifting onto R leg with PT knee block post/ant 5x3 Amb. In //-bars with posture correction/ increase BOS (moderate cuing) Seated/ standing upright posture with mirror feedback, esp. During sit to stand Gait in clinic with emphasis on larger steps/ maintaining wider BOS Amb. Forward/ backwards (verbal cuing to increase step length) 10x     PT Long Term Goals - 07/05/19 1521      PT LONG TERM GOAL #1   Title  Pt. independent with HEP to increase B hip flexion/ R quad muscle strength 1/2 muscle grade to improve pain-free mobility.    Baseline  Decrease B LE muscle strength:  hip flexion 4/5 MMT, L quad 4+/5 MMT, R quad 4/5 MMT, B hamstring 5/5 MMT, B DF/PF 5/5 MMT. B UE AROM WFL and strength grossly 5/5 MMT except R shoulder flexion 4+/5 MMT; hip flexion R/l: 4-/4, knee extension 4+/5, ankle PF/DF 5/5, hip abduction 5/5, hip adduction 4+/5 and poor compliance with HEP    Time  4    Period  Weeks    Status  On-going    Target Date  08/02/19      PT LONG TERM GOAL #2   Title  Pt. will increase Berg balance test to >40 out of 56 to improve independence with gait/ decrease fall risk.    Baseline  Berg: 22/56 (significant fall risk); 9/30 32/56    Time  4    Period  Weeks    Status  On-going    Target Date  08/02/19      PT LONG TERM GOAL #3   Title  Pt. able to ambulate 100 feet with consistent 2 point gait pattern and use  of least restrictive assistive devce to improve household mobility.    Baseline  Pt. able to ambulate with use of RW/ CGA to min. A for safety.   Heavy use of B UE.; 9/30 pt able to ambulate with rollator with inconsistent 2 point gait pattern, heavy reliance on UE CGA/supervision    Time  8    Period  Weeks    Status  On-going      PT LONG TERM GOAL #4   Title  Pt. able to stand from normal chair with no UE assist to improve safety/independence with transfers.     Baseline  Unable to stand from standard chair without heavy UE assist. 10/6, pt unable to stand from standard chair without UE support    Time  4    Period  Weeks    Status  On-going    Target Date  08/02/19            Plan - 07/11/19 1536    Clinical Impression Statement  Marked increase in standing/ tx. endurnace today but increase low back discomfort reported.  Pt. requires UE assist in //-bars/ rollator with all standing ther.ex./ wt. shifting/ balance tasks secondary to R knee pain.  Fear of falling remains while walking out of //-bars/ outside but no LOB during tx. session.  Palpable R knee pain on lateral joint line/ distal quad/ ITB.  No change to HEP at this time.    Stability/Clinical Decision Making  Evolving/Moderate complexity    Clinical Decision Making  Moderate    Rehab Potential  Good    PT Frequency  2x / week    PT Duration  4 weeks    PT Treatment/Interventions  ADLs/Self Care Home Management;Therapeutic activities;Functional mobility training;Stair training;Gait training;Therapeutic exercise;Balance training;Neuromuscular re-education;Patient/family education;Manual techniques;Passive range of motion    PT Next Visit Plan  weight shifting exercises, gait mechanics (decrease UE assist), LE strengthening/ endurance    PT Home Exercise Plan  See handouts    Consulted and Agree with Plan of Care  Patient;Family member/caregiver    Family Member Consulted  spouse: [name]       Patient will benefit from  skilled therapeutic intervention in order to improve the following deficits and impairments:  Abnormal gait, Decreased balance, Decreased endurance, Decreased mobility, Difficulty walking, Decreased range of motion, Decreased activity tolerance, Decreased strength, Impaired flexibility, Postural dysfunction, Pain  Visit Diagnosis: Muscle weakness (generalized)  Gait difficulty  Abnormal  posture     Problem List Patient Active Problem List   Diagnosis Date Noted  . Lymphedema 04/15/2019  . Bilateral leg edema 01/25/2019  . Hyponatremia 12/24/2018  . SIADH (syndrome of inappropriate ADH production) ([place]) 12/24/2018  . Erosion of urethra due to catheterization of urinary tract ([place]) 10/27/2018  . Generalized weakness 10/14/2018  . Hip fracture ([place]) 09/15/2018  . 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 HLD (hyperlipidemia) 03/24/2018  . Contusion of right knee 02/14/2018  . Bradycardia 12/28/2017  . 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 Chronic pain of right knee 08/10/2016  . Right ankle pain 08/10/2016  . 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  . 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[place]) 12/11/2015  . Urinary retention 09/08/2015  . Lymphoma, 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[place]) 04/03/2015  . Breathlessness on exertion 11/21/2014  . Breath shortness 11/21/2014  . Arthropathy 11/07/2014  . Atrial flutter, paroxysmal ([place]) 11/07/2014  . Type 2 diabetes mellitus ([place]) 11/07/2014  . Benign essential tremor 11/07/2014  . Benign essential HTN 11/07/2014  . Mixed incontinence 11/07/2014  . Hypercholesterolemia without hypertriglyceridemia 11/07/2014  . Apnea, sleep 11/07/2014  . Controlled type 2 diabetes mellitus without complication ([place]) 0000000  . Pure hypercholesterolemia 11/07/2014  . Other abnormalities of gait and mobility 11/02/2011  . Decreased mobility 11/02/2011  .  Abnormal gait 06/29/2011  . Discoordination 06/29/2011   Pura Spice, PT, DPT # [phone] 07/11/2019, 3:40 PM  [facility] [address] [place], [place], [address] Phone: [phone]   Fax:  [phone]  Name: Mike Holloway MRN: [id number] Date of Birth: Jun 10, 1941

## 2019-07-09 NOTE — Progress Notes (Signed)
Pain Management Virtual Encounter Note - Virtual Visit via [place] (real-time audio visits between healthcare provider and patient).   Patient's Phone No. & Preferred Pharmacy:  [phone] (home); [phone] (mobile); (Preferred) [phone] [email or web address]  [facility] [id number] [facility] [place] [address] AT [address] [place] [address] Phone: [phone] Fax: [phone]    Pre-screening note:  Our staff contacted Mr. Baldi and offered him an "in person", "face-to-face" appointment versus a telephone encounter. He indicated preferring the telephone encounter, at this time.   Reason for Virtual Visit: COVID-19*  Social distancing based on CDC and AMA recommendations.   I contacted [name] on 07/09/2019 via video conference.      I clearly identified myself as Gillis Santa, MD. I verified that I was speaking with the correct person using two identifiers (Name: Mike Holloway, and date of birth: 05-22-1941).  Advanced Informed Consent I sought verbal advanced consent from [name] for virtual visit interactions. I informed Mr. Lehne of possible security and privacy concerns, risks, and limitations associated with providing "not-in-person" medical evaluation and management services. I also informed Mr. Stencil of the availability of "in-person" appointments. Finally, I informed him that there would be a charge for the virtual visit and that he could be  personally, fully or partially, financially responsible for it. Mr. Wielenga expressed understanding and agreed to proceed.   Historic Elements   Mr. Saadiq Olmsted is a 78 y.o. year old, male patient evaluated today after his last encounter by our practice on 05/29/2019. Mr. Dreibelbis  has a past medical history of Arthritis, Atrial flutter ([place]), Diabetes mellitus ([place]), Essential tremor, Essential tremor, Hypercholesteremia, Hypertension,  Incontinence, 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[place]), Sleep apnea, and Stroke ([place]). 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 Mr. Tomme has a current medication list which includes the following prescription(s): acetaminophen, aspirin ec, azelastine hcl, cholecalciferol, diclofenac sodium, famotidine, fexofenadine, fluoxetine, guaifenesin-dextromethorphan, melatonin, mirabegron er, primidone, vsl#3, propranolol er, psyllium, UNABLE TO FIND, cephalexin, furosemide, and tamsulosin. He  reports that he has never smoked. He has never used smokeless tobacco. He reports that he does not drink alcohol or use drugs. Mr. Grindstaff is allergic to clindamycin; flagyl [metronidazole]; ambien  [zolpidem]; penicillins; and sulfa antibiotics.   HPI  Today, he is being contacted for a post-procedure assessment.   Evaluation of last interventional procedure  05/29/2019 Procedure:   Type: Therapeutic Superior-lateral, Superior-medial, and Inferior-medial, Genicular Nerve Radiofrequency Ablation.  #1  Region: Lateral, Anterior, and Medial aspects of the knee joint, above and below the knee joint proper. Level: Superior and inferior to the knee joint. Laterality: Right  Influential Factors: Intra-procedural challenges: None observed.         Reported side-effects: None.        Post-procedural adverse reactions or complications: None reported         Sedation: Please see nurses note for DOS. When no sedatives are used, the analgesic levels obtained are directly associated to the effectiveness of the local anesthetics. However, when sedation is  provided, the level of analgesia obtained during the initial 1 hour following the intervention, is believed to be the result of a combination  of factors. These factors may include, but are not limited to: 1. The effectiveness of the local anesthetics used. 2. The effects of the analgesic(s) and/or anxiolytic(s) used. 3. The degree of discomfort experienced by the patient at the time of the procedure. 4. The patients ability and reliability in recalling and recording the events. 5. The presence and influence of possible secondary gains and/or psychosocial factors. Reported result: Relief experienced during the 1st hour after the procedure: 100 % (Ultra-Short Term Relief)            Interpretative annotation: Clinically appropriate result. Analgesia during this period is likely to be Local Anesthetic and/or IV Sedative (Analgesic/Anxiolytic) related.          Effects of local anesthetic: The analgesic effects attained during this period are directly associated to the localized infiltration of local anesthetics and therefore cary significant diagnostic value as to the etiological location, or anatomical origin, of the pain. Expected duration of relief is directly dependent on the pharmacodynamics of the local anesthetic used. Long-acting (4-6 hours) anesthetics used.  Reported result: Relief during the next 4 to 6 hour after the procedure: 100 % (Short-Term Relief)            Interpretative annotation: Clinically appropriate result. Analgesia during this period is likely to be Local Anesthetic-related.          Long-term benefit: Defined as the period of time past the expected duration of local anesthetics (1 hour for short-acting and 4-6 hours for long-acting). With the possible exception of prolonged sympathetic blockade from the local anesthetics, benefits during this period are typically attributed to, or associated with, other factors such as analgesic sensory neuropraxia, antiinflammatory effects, or  beneficial biochemical changes provided by agents other than the local anesthetics.  Reported result: Extended relief following procedure: 40-45% (Long-Term Relief)            Interpretative annotation: Clinically appropriate result. Partial relief.                Laboratory Chemistry Profile (12 mo)  Renal: 11/13/2018: BUN 17; Creatinine, Ser 1.01  Lab Results  Component Value Date   GFRAA >60 11/13/2018   GFRNONAA >60 11/13/2018   Hepatic: 09/15/2018: Albumin 3.9 Lab Results  Component Value Date   AST 19 09/15/2018   ALT 13 09/15/2018   Other: No results found for requested labs within last 8760 hours. Note: Above Lab results reviewed.  Imaging  Last 90 days:  Dg Pain Clinic C-arm 1-60 Min No Report  Result Date: 05/28/2019 Fluoro was used, but no Radiologist interpretation will be provided. Please refer to "NOTES" tab for provider progress note.  Vas [place] Lower Extremity Venous Reflux  Result Date: 07/02/2019  Lower Venous Reflux Study Indications: Swelling 3 months ago, no swelling at this time.  Performing Technologist: Concha Norway RVT  Examination Guidelines: A complete evaluation includes B-mode imaging, spectral Doppler, color Doppler, and power Doppler as needed of all accessible portions of each vessel. Bilateral testing is considered an integral part of a complete examination. Limited examinations for reoccurring indications may be performed as noted. The reflux portion of the exam is performed with the patient in reverse Trendelenburg.  +------+---------------+---------+-----------+----------+--------------+ RIGHT CompressibilityPhasicitySpontaneityPropertiesThrombus Aging +------+---------------+---------+-----------+----------+--------------+ CFV   Full           Yes      Yes                                 +------+---------------+---------+-----------+----------+--------------+  SFJ   Full                                                         +------+---------------+---------+-----------+----------+--------------+ FV MidFull           Yes      Yes                                 +------+---------------+---------+-----------+----------+--------------+ POP   Full           Yes      Yes                                 +------+---------------+---------+-----------+----------+--------------+ GSV   Full                                                        +------+---------------+---------+-----------+----------+--------------+ SSV   Full                                                        +------+---------------+---------+-----------+----------+--------------+   +------+---------------+---------+-----------+----------+--------------+ LEFT  CompressibilityPhasicitySpontaneityPropertiesThrombus Aging +------+---------------+---------+-----------+----------+--------------+ CFV   Full           Yes      Yes                                 +------+---------------+---------+-----------+----------+--------------+ SFJ   Full                                                        +------+---------------+---------+-----------+----------+--------------+ FV MidFull           Yes      Yes                                 +------+---------------+---------+-----------+----------+--------------+ POP   Full           Yes      Yes                                 +------+---------------+---------+-----------+----------+--------------+ GSV   Full                                                        +------+---------------+---------+-----------+----------+--------------+ SSV   Full                                                        +------+---------------+---------+-----------+----------+--------------+  Venous Reflux Times Normal value < 0.5 sec +------------------------------+----------+---------+                               Right (ms)Left (ms)  +------------------------------+----------+---------+ CFV                           741.00    704.00    +------------------------------+----------+---------+ FV                            836.00    851.00    +------------------------------+----------+---------+ Popliteal                     594.00    697.00    +------------------------------+----------+---------+ GSV at Saphenofemoral junction528.00    623.00    +------------------------------+----------+---------+ GSV prox thigh                          462.00    +------------------------------+----------+---------+ SSV prox                      770.00              +------------------------------+----------+---------+ +------------------------------+----------+---------+ VEIN DIAMETERS:               Right (cm)Left (cm) +------------------------------+----------+---------+ GSV at Saphenofemoral junction.60       .50       +------------------------------+----------+---------+ GSV at prox thigh             .60       .53       +------------------------------+----------+---------+ SSV prox                      .24                 +------------------------------+----------+---------+   Summary: Right: Abnormal reflux times were noted in the great saphenous vein at the saphenofemoral junction, and proximal small saphenous vein. There is no evidence of deep vein thrombosis in the lower extremity.There is no evidence of superficial venous thrombosis. Left: Abnormal reflux times were noted in the great saphenous vein at the saphenofemoral junction. There is no evidence of deep vein thrombosis in the lower extremity.There is no evidence of superficial venous thrombosis.  *See table(s) above for measurements and observations. Electronically signed by Hortencia Pilar MD on 07/02/2019 at 5:03:37 PM.    Final     Assessment  The primary encounter diagnosis was Primary osteoarthritis of right knee. A diagnosis of Hx of total knee  arthroplasty, right was also pertinent to this visit.  Plan of Care  I am having Srivatsa Mashek. Sowash maintain his primidone, propranolol ER, diclofenac sodium, tamsulosin, famotidine, aspirin EC, acetaminophen, VSL#3, FLUoxetine, cholecalciferol, psyllium, Melatonin, guaiFENesin-dextromethorphan, UNABLE TO FIND, furosemide, fexofenadine, Azelastine HCl, mirabegron ER, and cephALEXin.  Virtual visit for follow-up status post right genicular nerve RFA performed on 05/28/2019.  Patient has been working with physical therapy and states that while he is able to bear more weight on the right knee with less pain, he still having moderate to severe pain episodes.  He was expecting the genicular nerve RFA to provide greater pain relief.  He does feel that he has increased range of motion.  Given severe knee pain in the context of having completed a genicular nerve radiofrequency ablation,  would like to obtain right knee MRI for further evaluation.  Of note patient does have a deep brain stimulator in place for essential tremors.  I instructed the patient to discuss MRI suitability of DBS with his neurosurgeon which he as well as his wife agrees to do.  I will call the patient back with his MRI results and discuss plan at that time.  Patient and his wife endorsed understanding.  Orders:  Orders Placed This Encounter  Procedures  . MR KNEE RIGHT WO CONTRAST    Standing Status:   Future    Standing Expiration Date:   09/07/2020    Order Specific Question:   What is the patient's sedation requirement?    Answer:   No Sedation    Order Specific Question:   Does the patient have a pacemaker or implanted devices?    Answer:   Yes    Comments:   Deep Brain Stimulator    Order Specific Question:   Preferred imaging location?    Answer:   [facility] (table limit-400lbs)    Order Specific Question:   Radiology Contrast Protocol - do NOT remove file path    Answer:   \\[email or web address]    Follow-up plan:   Return for I'll call patient with MRI results.     Status post 2+ diagnostic right knee genicular nerve blocks on 07/03/2018 and 08/23/2018, s/p R GN RFA on 05/28/2019     Recent Visits Date Type Provider Dept  05/28/19 Procedure visit Gillis Santa, MD [facility]  05/22/19 Office Visit Gillis Santa, MD [facility]  Showing recent visits within past 90 days and meeting all other requirements   Today's Visits Date Type Provider Dept  07/09/19 Office Visit Gillis Santa, MD [facility]  Showing today's visits and meeting all other requirements   Future Appointments No visits were found meeting these conditions.  Showing future appointments within next 90 days and meeting all other requirements   I discussed the assessment and treatment plan with the patient. The patient was provided an opportunity to ask questions and all were answered. The patient agreed with the plan and demonstrated an understanding of the instructions.  Patient advised to call back or seek an in-person evaluation if the symptoms or condition worsens.  Total duration of non-face-to-face encounter: 25minutes.  Note by: Gillis Santa, MD Date: 07/09/2019; Time: 3:17 PM  Note: This dictation was prepared with Dragon dictation. Any transcriptional errors that may result from this process are unintentional.  Disclaimer:  * Given the special circumstances of the COVID-19 pandemic, the federal government has announced that the Office for Civil Rights (OCR) will exercise its enforcement discretion and will not impose penalties on physicians using telehealth in the event of noncompliance with regulatory requirements under the [place] (HIPAA) in connection with the good faith provision of telehealth during the XX123456 national public health emergency. ([place])

## 2019-07-10 ENCOUNTER — Telehealth: Payer: Self-pay | Admitting: *Deleted

## 2019-07-11 ENCOUNTER — Encounter: Payer: Self-pay | Admitting: Physical Therapy

## 2019-07-11 ENCOUNTER — Ambulatory Visit: Payer: Medicare Other | Admitting: Physical Therapy

## 2019-07-11 ENCOUNTER — Encounter: Payer: Medicare Other | Admitting: Physical Therapy

## 2019-07-11 ENCOUNTER — Other Ambulatory Visit: Payer: Self-pay

## 2019-07-11 DIAGNOSIS — M6281 Muscle weakness (generalized): Secondary | ICD-10-CM

## 2019-07-11 DIAGNOSIS — R293 Abnormal posture: Secondary | ICD-10-CM

## 2019-07-11 DIAGNOSIS — R269 Unspecified abnormalities of gait and mobility: Secondary | ICD-10-CM

## 2019-07-11 NOTE — Therapy (Signed)
[facility] [address]. [place], [place], [address] Phone: [phone]   Fax:  [phone]  Physical Therapy Treatment  Patient Details  Name: Mike Holloway MRN: [id number] Date of Birth: Jan 30, 1941 Referring Provider (PT): Dr. Rudene Christians   Encounter Date: 07/11/2019  PT End of Session - 07/11/19 1459    Visit Number  18    Number of Visits  24    Date for PT Re-Evaluation  08/02/19    Authorization - Visit Number  3    Authorization - Number of Visits  10    PT Start Time  [id number]    PT Stop Time  1455    PT Time Calculation (min)  52 min    Equipment Utilized During Treatment  Gait belt    Activity Tolerance  Patient tolerated treatment well;Patient limited by pain    Behavior During Therapy  [facility] for tasks assessed/performed       Past Medical History:  Diagnosis Date  . Arthritis   . Atrial flutter ([place])   . Diabetes mellitus ([place])   . Essential tremor   . Essential tremor    deep brain stimulator   . Hypercholesteremia   . Hypertension   . Incontinence   . 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 Sleep apnea   . Stroke [facility])     Past Surgical History:  Procedure Laterality Date  . ABLATION    . APPENDECTOMY    . CATARACT EXTRACTION, BILATERAL    . COLONOSCOPY WITH PROPOFOL N/A 03/17/2018   Procedure: COLONOSCOPY WITH PROPOFOL;  Surgeon: Jonathon Bellows, MD;  Location: [facility] ENDOSCOPY;  Service: Gastroenterology;  Laterality: N/A;  . DEEP BRAIN STIMULATOR PLACEMENT    . 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 HEMORRHOID SURGERY    . HERNIA REPAIR    . HIP FRACTURE SURGERY    . INTRAMEDULLARY (IM) NAIL INTERTROCHANTERIC Right 09/16/2018   Procedure: INTRAMEDULLARY (IM) NAIL INTERTROCHANTRIC;  Surgeon: Hessie Knows, MD;  Location: [facility] ORS;  Service: Orthopedics;  Laterality:  Right;  . IR CATHETER TUBE CHANGE  11/22/2018  . NASAL SINUS SURGERY    . ORCHIECTOMY    . TONSILLECTOMY      There were no vitals filed for this visit.  Subjective Assessment - 07/11/19 1448    Subjective  Pt reports 1/10 in R knee.  Pt reports had 4/10 back pain in low back earlier, is now 0/10 after taking Tylenol.  Pt states he has been doing his HEP and walking with rollator.    Patient is accompained by:  Family member    Limitations  Lifting;Standing;Walking;House hold activities    Patient Stated Goals  Improve LE strength/ gait and balance with daily tasks.  Improve gait/ safety/ progression to least assistive device.    Currently in Pain?  Yes    Pain Score  1     Pain Location  Knee    Pain Orientation  Right    Pain Descriptors / Indicators  Aching    Pain Type  Chronic pain    Pain Onset  More than a month ago       Therapeutic Exercise: Nustep L4 x10 min Gait in clinic and out to car with emphasis on decreased UE WB during R LE stance, large steps, and tall posture  Neuro: Weight-shifting onto  R LE - 2x8 with 10 sec holds Pre-gait weight-shifting on R LE with L step - 2x8 Weight-shifting onto R LE with L LE on airex pad - 2x8 with 15 sec holds, pt able to WB 80% on R LE with progressive R UE WB (reduces to 60% after ~10 seconds) Forward/ lateral/ rotational reaching for cones - 2x10 each     PT Long Term Goals - 07/05/19 1521      PT LONG TERM GOAL #1   Title  Pt. independent with HEP to increase B hip flexion/ R quad muscle strength 1/2 muscle grade to improve pain-free mobility.    Baseline  Decrease B LE muscle strength: hip flexion 4/5 MMT, L quad 4+/5 MMT, R quad 4/5 MMT, B hamstring 5/5 MMT, B DF/PF 5/5 MMT. B UE AROM WFL and strength grossly 5/5 MMT except R shoulder flexion 4+/5 MMT; hip flexion R/l: 4-/4, knee extension 4+/5, ankle PF/DF 5/5, hip abduction 5/5, hip adduction 4+/5 and poor compliance with HEP    Time  4    Period  Weeks    Status   On-going    Target Date  08/02/19      PT LONG TERM GOAL #2   Title  Pt. will increase Berg balance test to >40 out of 56 to improve independence with gait/ decrease fall risk.    Baseline  Berg: 22/56 (significant fall risk); 9/30 32/56    Time  4    Period  Weeks    Status  On-going    Target Date  08/02/19      PT LONG TERM GOAL #3   Title  Pt. able to ambulate 100 feet with consistent 2 point gait pattern and use of least restrictive assistive devce to improve household mobility.    Baseline  Pt. able to ambulate with use of RW/ CGA to min. A for safety.   Heavy use of B UE.; 9/30 pt able to ambulate with rollator with inconsistent 2 point gait pattern, heavy reliance on UE CGA/supervision    Time  8    Period  Weeks    Status  On-going      PT LONG TERM GOAL #4   Title  Pt. able to stand from normal chair with no UE assist to improve safety/independence with transfers.     Baseline  Unable to stand from standard chair without heavy UE assist. 10/6, pt unable to stand from standard chair without UE support    Time  4    Period  Weeks    Status  On-going    Target Date  08/02/19            Plan - 07/11/19 1806    Clinical Impression Statement  Pt demonstrates increase in pain-free R LE WB endurance in standing (improved from immediate R knee pain with WB to pain after ~10 seconds) with decreased UE support.  Pt demonstrates progressive increase in R UE WB with prolonged standing/ walking.  PT guarding R knee during weight-shifting reveals some R knee weakness/ instability after several reps of 10 second R WB holds.  Pt will benefit from further skilled therapy to increase LE strength/ endurance and improve balance/ gait mechanics.    Stability/Clinical Decision Making  Evolving/Moderate complexity    Clinical Decision Making  Moderate    Rehab Potential  Good    PT Frequency  2x / week    PT Duration  4 weeks    PT Treatment/Interventions  ADLs/Self Care Home  Management;Therapeutic activities;Functional mobility training;Stair training;Gait training;Therapeutic exercise;Balance training;Neuromuscular re-education;Patient/family education;Manual techniques;Passive range of motion    PT Next Visit Plan  weight shifting exercises, gait mechanics (decrease UE assist), LE strengthening/ endurance    PT Home Exercise Plan  See handouts    Consulted and Agree with Plan of Care  Patient;Family member/caregiver    Family Member Consulted  spouse: [name]       Patient will benefit from skilled therapeutic intervention in order to improve the following deficits and impairments:  Abnormal gait, Decreased balance, Decreased endurance, Decreased mobility, Difficulty walking, Decreased range of motion, Decreased activity tolerance, Decreased strength, Impaired flexibility, Postural dysfunction, Pain  Visit Diagnosis: Muscle weakness (generalized)  Gait difficulty  Abnormal posture     Problem List Patient Active Problem List   Diagnosis Date Noted  . Lymphedema 04/15/2019  . Bilateral leg edema 01/25/2019  . Hyponatremia 12/24/2018  . SIADH (syndrome of inappropriate ADH production) ([place]) 12/24/2018  . Erosion of urethra due to catheterization of urinary tract ([place]) 10/27/2018  . Generalized weakness 10/14/2018  . Hip fracture ([place]) 09/15/2018  . 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 HLD (hyperlipidemia) 03/24/2018  . Contusion of right knee 02/14/2018  . Bradycardia 12/28/2017  . 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 Chronic pain of right knee 08/10/2016  . Right ankle pain 08/10/2016  . 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  . 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[place]) 12/11/2015  . Urinary retention 09/08/2015  . Lymphoma, 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[place]) 04/03/2015  . Breathlessness on exertion 11/21/2014  . Breath shortness 11/21/2014  .  Arthropathy 11/07/2014  . Atrial flutter, paroxysmal ([place]) 11/07/2014  . Type 2 diabetes mellitus ([place]) 11/07/2014  . Benign essential tremor 11/07/2014  . Benign essential HTN 11/07/2014  . Mixed incontinence 11/07/2014  . Hypercholesterolemia without hypertriglyceridemia 11/07/2014  . Apnea, sleep 11/07/2014  . Controlled type 2 diabetes mellitus without complication ([place]) 0000000  . Pure hypercholesterolemia 11/07/2014  . Other abnormalities of gait and mobility 11/02/2011  . Decreased mobility 11/02/2011  . Abnormal gait 06/29/2011  . Discoordination 06/29/2011   Pura Spice, PT, DPT # [id number] Chinita Greenland, SPT 07/12/2019, 6:02 PM  [place] [facility] [address] [place], [place], [address] Phone: [phone]   Fax:  [phone]  Name: Raydon Kritzman MRN: [id number] Date of Birth: 1941/04/01

## 2019-07-12 ENCOUNTER — Ambulatory Visit: Payer: Medicare Other | Admitting: Physical Therapy

## 2019-07-15 NOTE — Progress Notes (Addendum)
07/16/2019 4:22 PM   Mike Holloway 1941/09/23 [id number]  Referring provider: [name], MD [place],  [place] [address]  Chief Complaint  Patient presents with  . Urinary Retention    1 month    HPI: Mike Holloway is a 78 year old male with a history of urinary retention managed who presents today for follow up with his wife, [name].  SPT was removed on 05/10/2019.    BPH WITH LUTS  (prostate and/or bladder) PVR:  424 mL     Previous IPSS score: 11/5    Previous PVR: 76 mL     Major complaint(s):  He is having outputs of 1200 cc daily as well as having wet depends. Denies any dysuria, hematuria or suprapubic pain.  He states he is losing his urine when he rises from a sitting to the standing position.    Currently taking: Myrbetriq 50 mg.    Denies any recent fevers, chills, nausea or vomiting.  PMH: Past Medical History:  Diagnosis Date  . Arthritis   . Atrial flutter ([place])   . Diabetes mellitus ([place])   . Essential tremor   . Essential tremor    deep brain stimulator   . Hypercholesteremia   . Hypertension   . Incontinence   . 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 Sleep apnea   . Stroke [facility])     Surgical History: Past Surgical History:  Procedure Laterality Date  . ABLATION    . APPENDECTOMY    . CATARACT EXTRACTION, BILATERAL    . COLONOSCOPY WITH PROPOFOL N/A 03/17/2018   Procedure: COLONOSCOPY WITH PROPOFOL;  Surgeon: Jonathon Bellows, MD;  Location: [facility] ENDOSCOPY;  Service: Gastroenterology;  Laterality: N/A;  . DEEP BRAIN STIMULATOR PLACEMENT    . 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 HEMORRHOID SURGERY    . HERNIA REPAIR    . HIP FRACTURE SURGERY    . INTRAMEDULLARY (IM) NAIL INTERTROCHANTERIC Right 09/16/2018   Procedure: INTRAMEDULLARY (IM) NAIL INTERTROCHANTRIC;  Surgeon: Hessie Knows, MD;  Location: [facility] ORS;  Service: Orthopedics;  Laterality: Right;  . IR CATHETER TUBE CHANGE  11/22/2018  . NASAL SINUS SURGERY    . ORCHIECTOMY    . TONSILLECTOMY      Home Medications:  Allergies as of 07/16/2019      Reactions   Clindamycin Diarrhea   Contracted C. Diff x 2   Flagyl [metronidazole] Swelling   Swollen tongue, excessive shaking,     Ambien  [zolpidem]    Other reaction(s): Other (See Comments) Disoriented and moody Other reaction(s): Other (See Comments) Disoriented and moody   Penicillins Rash   Has patient had a PCN reaction causing immediate rash, facial/tongue/throat swelling, SOB or lightheadedness with hypotension: Unknown Has patient had a PCN reaction causing severe rash involving mucus membranes or skin necrosis: Unknown Has patient had a PCN reaction that required hospitalization: No Has patient had a PCN reaction occurring within the last 10 years: No If all of the above answers are "NO", then may proceed with Cephalosporin use.   Sulfa Antibiotics Rash      Medication List       Accurate as of July 16, 2019  4:22 PM. If you have any questions, ask your nurse or doctor.        STOP taking these medications  furosemide 20 MG tablet Commonly known as: LASIX Stopped by: Anasofia Micallef, PA-C   tamsulosin 0.4 MG Caps capsule Commonly known as: FLOMAX Stopped by: Zara Council, PA-C     TAKE these medications   acetaminophen 650 MG CR tablet Commonly known as: TYLENOL Take 650 mg by mouth every 8 (eight) hours as needed for pain.   aspirin EC 325 MG tablet Take 1 tablet (325 mg total) by mouth daily.   Azelastine HCl 0.15 % Soln U 1 SPRAY IEN BID   cephALEXin 500 MG capsule Commonly known as: Keflex Take 1 capsule (500 mg total) by mouth 4 (four) times daily.   cholecalciferol 25 MCG (1000 UT) tablet Commonly known as: VITAMIN D Take 1,000 Units by mouth at bedtime.   diclofenac sodium 1 % Gel Commonly known as:  VOLTAREN Apply 2 g topically 2 (two) times daily as needed (pain).   famotidine 20 MG tablet Commonly known as: PEPCID Take 20 mg by mouth 2 (two) times daily.   fexofenadine 180 MG tablet Commonly known as: ALLEGRA TK 1 T PO ONCE D   FLUoxetine 10 MG capsule Commonly known as: PROZAC Take 10 mg by mouth daily.   guaiFENesin-dextromethorphan 100-10 MG/5ML syrup Commonly known as: ROBITUSSIN DM Take 5 mLs by mouth every 4 (four) hours as needed for cough.   Melatonin 5 MG Tabs Take 5 mg by mouth at bedtime. Takes 2 3 mg tablets   mirabegron ER 50 MG Tb24 tablet Commonly known as: MYRBETRIQ Take 1 tablet (50 mg total) by mouth daily.   primidone 50 MG tablet Commonly known as: MYSOLINE Take 50-100 mg by mouth 2 (two) times daily. Take 2 tablets (100mg ) by mouth every morning and 1 tablet (50mg ) by mouth every night at bedtime   propranolol ER 160 MG SR capsule Commonly known as: INDERAL LA Take 160 mg by mouth daily.   psyllium 95 % Pack Commonly known as: HYDROCIL/METAMUCIL Take 1 packet by mouth daily.   UNABLE TO FIND APPLY FROM NECK DOWN DAILY   VSL#3 Caps Take 1 capsule by mouth daily.       Allergies:  Allergies  Allergen Reactions  . Clindamycin Diarrhea    Contracted C. Diff x 2  . Flagyl [Metronidazole] Swelling    Swollen tongue, excessive shaking,    . Ambien  [Zolpidem]     Other reaction(s): Other (See Comments) Disoriented and moody Other reaction(s): Other (See Comments) Disoriented and moody  . Penicillins Rash    Has patient had a PCN reaction causing immediate rash, facial/tongue/throat swelling, SOB or lightheadedness with hypotension: Unknown Has patient had a PCN reaction causing severe rash involving mucus membranes or skin necrosis: Unknown Has patient had a PCN reaction that required hospitalization: No Has patient had a PCN reaction occurring within the last 10 years: No If all of the above answers are "NO", then may proceed with  Cephalosporin use.  . Sulfa Antibiotics Rash    Family History: Family History  Problem Relation Age of Onset  . Hematuria Father   . Prostate cancer Father   . Diabetes Father   . Heart disease Mother   . Diabetes Mother   . Kidney disease Neg Hx   . Bladder Cancer Neg Hx     Social History:  reports that he has never smoked. He has never used smokeless tobacco. He reports that he does not drink alcohol or use drugs.  ROS: UROLOGY Frequent Urination?: No Hard to postpone urination?: Yes Burning/pain  with urination?: No Get up at night to urinate?: No Leakage of urine?: Yes Urine stream starts and stops?: Yes Trouble starting stream?: Yes Do you have to strain to urinate?: No Blood in urine?: No Urinary tract infection?: No Sexually transmitted disease?: No Injury to kidneys or bladder?: No Painful intercourse?: No Weak stream?: Yes Erection problems?: Yes Penile pain?: No  Gastrointestinal Nausea?: No Vomiting?: No Indigestion/heartburn?: No Diarrhea?: No Constipation?: No  Constitutional Fever: No Night sweats?: No Weight loss?: No Fatigue?: No  Skin Skin rash/lesions?: No Itching?: No  Eyes Blurred vision?: No Double vision?: No  Ears/Nose/Throat Sore throat?: No Sinus problems?: No  Hematologic/Lymphatic Swollen glands?: No Easy bruising?: No  Cardiovascular Leg swelling?: No Chest pain?: No  Respiratory Cough?: No Shortness of breath?: No  Endocrine Excessive thirst?: No  Musculoskeletal Back pain?: No Joint pain?: No  Neurological Headaches?: No Dizziness?: No  Psychologic Depression?: No Anxiety?: No  Physical Exam: BP 136/77   Pulse 67   Ht 5\' 3"  (1.6 m)   Wt 210 lb (95.3 kg)   BMI 37.20 kg/m   Constitutional:  Well nourished. Alert and oriented, No acute distress. HEENT: [place] AT, moist mucus membranes.  Trachea midline, no masses. Cardiovascular: No clubbing, cyanosis, or edema. Respiratory: Normal respiratory  effort, no increased work of breathing. GI: Abdomen is soft, non tender, non distended, no abdominal masses.   GU: No CVA tenderness.  No bladder fullness or masses.  Patient with uncircumcised phallus.  Foreskin easily retracted  Urethral meatus is patent.  No penile discharge. No penile lesions or rashes.  Skin: No rashes, bruises or suspicious lesions. Neurologic: Grossly intact, no focal deficits, moving all 4 extremities.  In a wheelchair but can stand with assistance.   Psychiatric: Normal mood and affect.   Laboratory Data: Urinalysis Component     Latest Ref Rng & Units 07/16/2019  Color, Urine     YELLOW YELLOW  Appearance     CLEAR CLEAR  Specific Gravity, Urine     1.005 - 1.030 1.025  pH     5.0 - 8.0 6.0  Glucose, UA     NEGATIVE mg/dL NEGATIVE  Hgb urine dipstick     NEGATIVE TRACE (A)  Bilirubin Urine     NEGATIVE NEGATIVE  Ketones, ur     NEGATIVE mg/dL NEGATIVE  Protein     NEGATIVE mg/dL NEGATIVE  Nitrite     NEGATIVE NEGATIVE  Leukocytes,Ua     NEGATIVE NEGATIVE  Squamous Epithelial / LPF     0 - 5 0-5  WBC, UA     0 - 5 WBC/hpf 0-5  RBC / HPF     0 - 5 RBC/hpf 0-5  Bacteria, UA     NONE SEEN NONE SEEN  Mucus      PRESENT  I have reviewed the labs.   Pertinent Imaging  Results for BHAVIK, HAUPTMANN (MRN [id number]) as of 07/16/2019 16:39  Ref. Range 07/16/2019 16:39  Scan Result Unknown 424 mL    Assessment & Plan:    1. History of urinary retention PVR is 424 mL  We discussed placing an indwelling Foley or self cathing.  Mr. Donaghey is adamantly against placing an indwelling Foley and [name] is not comfortable with cathing him as she has tried it in the past and had great difficulty with cathing him.  I explained that the risk of having a high residual may result in frank retention requiring a visit to the ED for Foley  placement, sepsis if infected urine is present in the bladder, damage to the kidneys and permanent damage to the bladder  and/or bladder rupture.  They understand these risks and have agreed to return tomorrow for a repeat PVR and further discussion.   He will restart the tamsulosin 0.4 mg daily and discontinue the Myrbetriq.    2. Incontinence He has discontinued the Lasix and is still having issues with incontinence when standing   3. History of gross hematuria No reports of gross hematuria UA sent to check for microscopic hematuria  4. Nocturia Patient in need of the CPAP machine   Zara Council, PA-C  [place] [address], [place], [place] [address] [phone]

## 2019-07-16 ENCOUNTER — Encounter: Payer: Self-pay | Admitting: Urology

## 2019-07-16 ENCOUNTER — Ambulatory Visit (INDEPENDENT_AMBULATORY_CARE_PROVIDER_SITE_OTHER): Payer: Medicare Other | Admitting: Urology

## 2019-07-16 ENCOUNTER — Other Ambulatory Visit
Admission: RE | Admit: 2019-07-16 | Discharge: 2019-07-16 | Disposition: A | Discharge status: Home / Self Care | Payer: Medicare Other | Attending: Urology | Admitting: Urology

## 2019-07-16 ENCOUNTER — Other Ambulatory Visit: Payer: Self-pay

## 2019-07-16 ENCOUNTER — Ambulatory Visit: Payer: Medicare Other | Admitting: Physical Therapy

## 2019-07-16 VITALS — BP 136/77 | HR 67 | Ht 63.0 in | Wt 210.0 lb

## 2019-07-16 DIAGNOSIS — M6281 Muscle weakness (generalized): Secondary | ICD-10-CM | POA: Diagnosis not present

## 2019-07-16 DIAGNOSIS — R269 Unspecified abnormalities of gait and mobility: Secondary | ICD-10-CM

## 2019-07-16 DIAGNOSIS — R351 Nocturia: Secondary | ICD-10-CM | POA: Diagnosis present

## 2019-07-16 DIAGNOSIS — N39498 Other specified urinary incontinence: Secondary | ICD-10-CM

## 2019-07-16 DIAGNOSIS — Z87448 Personal history of other diseases of urinary system: Secondary | ICD-10-CM

## 2019-07-16 DIAGNOSIS — R293 Abnormal posture: Secondary | ICD-10-CM

## 2019-07-16 DIAGNOSIS — Z87898 Personal history of other specified conditions: Secondary | ICD-10-CM | POA: Diagnosis not present

## 2019-07-16 LAB — URINALYSIS, COMPLETE (UACMP) WITH MICROSCOPIC
Bacteria, UA: NONE SEEN
Bilirubin Urine: NEGATIVE
Glucose, UA: NEGATIVE mg/dL
Ketones, ur: NEGATIVE mg/dL
Leukocytes,Ua: NEGATIVE
Nitrite: NEGATIVE
Protein, ur: NEGATIVE mg/dL
Specific Gravity, Urine: 1.025 (ref 1.005–1.030)
pH: 6 (ref 5.0–8.0)

## 2019-07-16 LAB — BLADDER SCAN AMB NON-IMAGING: Scan Result: 424

## 2019-07-16 NOTE — Addendum Note (Signed)
Addended by: Parke Poisson on: 07/16/2019 04:59 PM   Modules accepted: Orders

## 2019-07-17 ENCOUNTER — Ambulatory Visit (INDEPENDENT_AMBULATORY_CARE_PROVIDER_SITE_OTHER): Payer: Medicare Other | Admitting: Urology

## 2019-07-17 ENCOUNTER — Encounter: Payer: Self-pay | Admitting: Urology

## 2019-07-17 DIAGNOSIS — Z87448 Personal history of other diseases of urinary system: Secondary | ICD-10-CM | POA: Diagnosis not present

## 2019-07-17 DIAGNOSIS — R351 Nocturia: Secondary | ICD-10-CM

## 2019-07-17 DIAGNOSIS — N39498 Other specified urinary incontinence: Secondary | ICD-10-CM | POA: Diagnosis not present

## 2019-07-17 DIAGNOSIS — R339 Retention of urine, unspecified: Secondary | ICD-10-CM

## 2019-07-17 LAB — BLADDER SCAN AMB NON-IMAGING

## 2019-07-17 NOTE — Progress Notes (Signed)
07/17/2019 1:18 PM   Mike Holloway June 25, 1941 [id number]  Referring provider: [name], MD [address] [place],  [place] [address]  Chief Complaint  Patient presents with  . Urinary Retention    HPI: Mike Holloway is a 78 year old male with a history of urinary retention who presents today for follow up with his wife, [name] to recheck his PVR.    SPT was removed on 05/10/2019.    Bladder scan was 424 mL yesterday.  He and his wife are adamantly against Foley catheterization.  They discontinued the Myrbetriq 50 mg daily and restarted the tamsulosin since 0.4 mg yesterday.  They present this afternoon for a repeat bladder scan.    Bladder scan today is 146 mL.     PMH: Past Medical History:  Diagnosis Date  . Arthritis   . Atrial flutter ([place])   . Diabetes mellitus ([place])   . Essential tremor   . Essential tremor    deep brain stimulator   . Hypercholesteremia   . Hypertension   . Incontinence   . 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 Sleep apnea   . Stroke [facility])     Surgical History: Past Surgical History:  Procedure Laterality Date  . ABLATION    . APPENDECTOMY    . CATARACT EXTRACTION, BILATERAL    . COLONOSCOPY WITH PROPOFOL N/A 03/17/2018   Procedure: COLONOSCOPY WITH PROPOFOL;  Surgeon: Jonathon Bellows, MD;  Location: [facility] ENDOSCOPY;  Service: Gastroenterology;  Laterality: N/A;  . DEEP BRAIN STIMULATOR PLACEMENT    . 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 HEMORRHOID SURGERY    . HERNIA REPAIR    . HIP FRACTURE SURGERY    . INTRAMEDULLARY (IM) NAIL INTERTROCHANTERIC Right 09/16/2018   Procedure: INTRAMEDULLARY (IM) NAIL INTERTROCHANTRIC;  Surgeon: Hessie Knows, MD;  Location: [facility] ORS;  Service: Orthopedics;  Laterality: Right;  . IR CATHETER TUBE CHANGE  11/22/2018  . NASAL SINUS SURGERY    . ORCHIECTOMY     . TONSILLECTOMY      Home Medications:  Allergies as of 07/17/2019      Reactions   Clindamycin Diarrhea   Contracted C. Diff x 2   Flagyl [metronidazole] Swelling   Swollen tongue, excessive shaking,     Ambien  [zolpidem]    Other reaction(s): Other (See Comments) Disoriented and moody Other reaction(s): Other (See Comments) Disoriented and moody   Penicillins Rash   Has patient had a PCN reaction causing immediate rash, facial/tongue/throat swelling, SOB or lightheadedness with hypotension: Unknown Has patient had a PCN reaction causing severe rash involving mucus membranes or skin necrosis: Unknown Has patient had a PCN reaction that required hospitalization: No Has patient had a PCN reaction occurring within the last 10 years: No If all of the above answers are "NO", then may proceed with Cephalosporin use.   Sulfa Antibiotics Rash      Medication List       Accurate as of July 17, 2019  1:18 PM. If you have any questions, ask your nurse or doctor.        acetaminophen 650 MG CR tablet Commonly known as: TYLENOL Take 650 mg by mouth every 8 (eight) hours as needed for pain.   aspirin EC 325 MG tablet Take 1 tablet (325 mg total) by mouth daily.   Azelastine HCl  0.15 % Soln U 1 SPRAY IEN BID   cephALEXin 500 MG capsule Commonly known as: Keflex Take 1 capsule (500 mg total) by mouth 4 (four) times daily.   cholecalciferol 25 MCG (1000 UT) tablet Commonly known as: VITAMIN D Take 1,000 Units by mouth at bedtime.   diclofenac sodium 1 % Gel Commonly known as: VOLTAREN Apply 2 g topically 2 (two) times daily as needed (pain).   famotidine 20 MG tablet Commonly known as: PEPCID Take 20 mg by mouth 2 (two) times daily.   fexofenadine 180 MG tablet Commonly known as: ALLEGRA TK 1 T PO ONCE D   FLUoxetine 10 MG capsule Commonly known as: PROZAC Take 10 mg by mouth daily.   guaiFENesin-dextromethorphan 100-10 MG/5ML syrup Commonly known as: ROBITUSSIN  DM Take 5 mLs by mouth every 4 (four) hours as needed for cough.   Melatonin 5 MG Tabs Take 5 mg by mouth at bedtime. Takes 2 3 mg tablets   mirabegron ER 50 MG Tb24 tablet Commonly known as: MYRBETRIQ Take 1 tablet (50 mg total) by mouth daily.   primidone 50 MG tablet Commonly known as: MYSOLINE Take 50-100 mg by mouth 2 (two) times daily. Take 2 tablets (100mg ) by mouth every morning and 1 tablet (50mg ) by mouth every night at bedtime   propranolol ER 160 MG SR capsule Commonly known as: INDERAL LA Take 160 mg by mouth daily.   psyllium 95 % Pack Commonly known as: HYDROCIL/METAMUCIL Take 1 packet by mouth daily.   UNABLE TO FIND APPLY FROM NECK DOWN DAILY   VSL#3 Caps Take 1 capsule by mouth daily.       Allergies:  Allergies  Allergen Reactions  . Clindamycin Diarrhea    Contracted C. Diff x 2  . Flagyl [Metronidazole] Swelling    Swollen tongue, excessive shaking,    . Ambien  [Zolpidem]     Other reaction(s): Other (See Comments) Disoriented and moody Other reaction(s): Other (See Comments) Disoriented and moody  . Penicillins Rash    Has patient had a PCN reaction causing immediate rash, facial/tongue/throat swelling, SOB or lightheadedness with hypotension: Unknown Has patient had a PCN reaction causing severe rash involving mucus membranes or skin necrosis: Unknown Has patient had a PCN reaction that required hospitalization: No Has patient had a PCN reaction occurring within the last 10 years: No If all of the above answers are "NO", then may proceed with Cephalosporin use.  . Sulfa Antibiotics Rash    Family History: Family History  Problem Relation Age of Onset  . Hematuria Father   . Prostate cancer Father   . Diabetes Father   . Heart disease Mother   . Diabetes Mother   . Kidney disease Neg Hx   . Bladder Cancer Neg Hx     Social History:  reports that he has never smoked. He has never used smokeless tobacco. He reports that he does not  drink alcohol or use drugs.  ROS: UROLOGY Frequent Urination?: No Hard to postpone urination?: Yes Burning/pain with urination?: No Get up at night to urinate?: No Leakage of urine?: No Urine stream starts and stops?: No Trouble starting stream?: Yes Do you have to strain to urinate?: No Blood in urine?: No Urinary tract infection?: No Sexually transmitted disease?: No Injury to kidneys or bladder?: No Painful intercourse?: No Weak stream?: No Erection problems?: No Penile pain?: No  Gastrointestinal Nausea?: No Vomiting?: No Indigestion/heartburn?: Yes Diarrhea?: No Constipation?: No  Constitutional Fever: No Night sweats?:  No Weight loss?: No Fatigue?: No  Skin Skin rash/lesions?: No Itching?: No  Eyes Blurred vision?: No Double vision?: No  Ears/Nose/Throat Sore throat?: No Sinus problems?: Yes  Hematologic/Lymphatic Swollen glands?: No Easy bruising?: No  Cardiovascular Leg swelling?: No Chest pain?: No  Respiratory Cough?: No Shortness of breath?: No  Endocrine Excessive thirst?: No  Musculoskeletal Back pain?: Yes Joint pain?: Yes  Neurological Headaches?: No Dizziness?: No  Psychologic Depression?: Yes Anxiety?: No  Physical Exam: Constitutional:  Well nourished. Alert and oriented, No acute distress. HEENT: [place] AT, moist mucus membranes.  Trachea midline, no masses. Cardiovascular: No clubbing, cyanosis, or edema. Respiratory: Normal respiratory effort, no increased work of breathing. Neurologic: Grossly intact, no focal deficits, moving all 4 extremities. Psychiatric: Normal mood and affect.   Laboratory Data: Urinalysis Component     Latest Ref Rng & Units 07/16/2019  Color, Urine     YELLOW YELLOW  Appearance     CLEAR CLEAR  Specific Gravity, Urine     1.005 - 1.030 1.025  pH     5.0 - 8.0 6.0  Glucose, UA     NEGATIVE mg/dL NEGATIVE  Hgb urine dipstick     NEGATIVE TRACE (A)  Bilirubin Urine     NEGATIVE  NEGATIVE  Ketones, ur     NEGATIVE mg/dL NEGATIVE  Protein     NEGATIVE mg/dL NEGATIVE  Nitrite     NEGATIVE NEGATIVE  Leukocytes,Ua     NEGATIVE NEGATIVE  Squamous Epithelial / LPF     0 - 5 0-5  WBC, UA     0 - 5 WBC/hpf 0-5  RBC / HPF     0 - 5 RBC/hpf 0-5  Bacteria, UA     NONE SEEN NONE SEEN  Mucus      PRESENT  I have reviewed the labs.   Pertinent Imaging  Results for MACIN, CZARNECKI (MRN [id number]) as of 07/16/2019 16:39  Ref. Range 07/16/2019 16:39  Scan Result Unknown 424 mL    Assessment & Plan:    1. History of urinary retention Resolved for now Return to clinic in 1 month for IPSS score and PVR Pat to notify us if his urinary output diminishes or he goes into frank retention Continue tamsulosin 0.4 mg daily  2. Incontinence No incontinence episodes through the night or through today We will reassess in 1 month  3. History of gross hematuria No reports of gross hematuria UA 0-5 RBC's, will repeat in one month  4. Nocturia Patient has acquired a CPAP machine and has been sleeping with it nightly  Zara Council, PA-C  [facility] [address], [place], [place] [address] [phone]

## 2019-07-18 ENCOUNTER — Ambulatory Visit: Payer: Medicare Other | Admitting: Physical Therapy

## 2019-07-18 ENCOUNTER — Encounter: Payer: Self-pay | Admitting: Physical Therapy

## 2019-07-18 ENCOUNTER — Other Ambulatory Visit: Payer: Self-pay

## 2019-07-18 DIAGNOSIS — R269 Unspecified abnormalities of gait and mobility: Secondary | ICD-10-CM

## 2019-07-18 DIAGNOSIS — M6281 Muscle weakness (generalized): Secondary | ICD-10-CM | POA: Diagnosis not present

## 2019-07-18 DIAGNOSIS — R293 Abnormal posture: Secondary | ICD-10-CM

## 2019-07-18 NOTE — Therapy (Addendum)
[facility] [address] [place], [place], [address] Phone: [phone]   Fax:  [phone]  Physical Therapy Treatment  Patient Details  Name: Mike Holloway MRN: [id number] Date of Birth: 01-Sep-1941 Referring Provider (PT): Dr. Rudene Christians   Encounter Date: 07/18/2019  PT End of Session - 07/18/19 1408    Visit Number  20    Number of Visits  24    Date for PT Re-Evaluation  08/02/19    Authorization - Visit Number  5    Authorization - Number of Visits  10    PT Start Time  1343    Equipment Utilized During Treatment  Gait belt    Activity Tolerance  Patient tolerated treatment well;Patient limited by pain    Behavior During Therapy  [facility] for tasks assessed/performed       Past Medical History:  Diagnosis Date  . Arthritis   . Atrial flutter ([place])   . Diabetes mellitus ([place])   . Essential tremor   . Essential tremor    deep brain stimulator   . Hypercholesteremia   . Hypertension   . Incontinence   . 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[place])    grew on the testical  . Sleep apnea   . Stroke [facility])     Past Surgical History:  Procedure Laterality Date  . ABLATION    . APPENDECTOMY    . CATARACT EXTRACTION, BILATERAL    . COLONOSCOPY WITH PROPOFOL N/A 03/17/2018   Procedure: COLONOSCOPY WITH PROPOFOL;  Surgeon: Jonathon Bellows, MD;  Location: [facility] ENDOSCOPY;  Service: Gastroenterology;  Laterality: N/A;  . DEEP BRAIN STIMULATOR PLACEMENT    . 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 HEMORRHOID SURGERY    . HERNIA REPAIR    . HIP FRACTURE SURGERY    . INTRAMEDULLARY (IM) NAIL INTERTROCHANTERIC Right 09/16/2018   Procedure: INTRAMEDULLARY (IM) NAIL INTERTROCHANTRIC;  Surgeon: Hessie Knows, MD;  Location: [facility] ORS;  Service: Orthopedics;  Laterality: Right;  . IR CATHETER TUBE CHANGE  11/22/2018  . NASAL SINUS  SURGERY    . ORCHIECTOMY    . TONSILLECTOMY      There were no vitals filed for this visit.  Subjective Assessment - 07/18/19 1359    Subjective  Pt. states R knee is hurting while walking into PT clinic.  Pt. did not give a subjective pain score today.    Patient is accompained by:  Family member    Limitations  Lifting;Standing;Walking;House hold activities    Patient Stated Goals  Improve LE strength/ gait and balance with daily tasks.  Improve gait/ safety/ progression to least assistive device.    Currently in Pain?  Yes    Pain Location  Knee    Pain Orientation  Right    Pain Onset  More than a month ago         Therapeutic Exercise:  Nustep L5 x10 min. Seated/standing LE there.ex. (5#): seated heel/toe raises/ marching/ LAQ/ standing heel raises/ hip abduction/ hip extension/ knee flexion 20x.   Sit to stand with 1-2 UE assist on armrest (in //-bars).   Discussed HEP/ standing tolerance and increasing walking times/distance  Neuro:  Forward/backward walking in //-bars working on step length/ maintaining adequate BOS and upright posture.  Pt. Completed with 5# ankle wts.   Gait in clinic and out to car with emphasis on decreased UE WB  during R LE stance, large steps, and tall posture Standing reaching to L/R (across body).       PT Education - 07/18/19 1350    Education Details  Pt educated on Glass blower/designer) Educated  Patient    Methods  Explanation;Demonstration    Comprehension  Verbalized understanding;Returned demonstration;Need further instruction          PT Long Term Goals - 07/05/19 1521      PT LONG TERM GOAL #1   Title  Pt. independent with HEP to increase B hip flexion/ R quad muscle strength 1/2 muscle grade to improve pain-free mobility.    Baseline  Decrease B LE muscle strength: hip flexion 4/5 MMT, L quad 4+/5 MMT, R quad 4/5 MMT, B hamstring 5/5 MMT, B DF/PF 5/5 MMT. B UE AROM WFL and strength grossly 5/5 MMT except R  shoulder flexion 4+/5 MMT; hip flexion R/l: 4-/4, knee extension 4+/5, ankle PF/DF 5/5, hip abduction 5/5, hip adduction 4+/5 and poor compliance with HEP    Time  4    Period  Weeks    Status  On-going    Target Date  08/02/19      PT LONG TERM GOAL #2   Title  Pt. will increase Berg balance test to >40 out of 56 to improve independence with gait/ decrease fall risk.    Baseline  Berg: 22/56 (significant fall risk); 9/30 32/56    Time  4    Period  Weeks    Status  On-going    Target Date  08/02/19      PT LONG TERM GOAL #3   Title  Pt. able to ambulate 100 feet with consistent 2 point gait pattern and use of least restrictive assistive devce to improve household mobility.    Baseline  Pt. able to ambulate with use of RW/ CGA to min. A for safety.   Heavy use of B UE.; 9/30 pt able to ambulate with rollator with inconsistent 2 point gait pattern, heavy reliance on UE CGA/supervision    Time  8    Period  Weeks    Status  On-going      PT LONG TERM GOAL #4   Title  Pt. able to stand from normal chair with no UE assist to improve safety/independence with transfers.     Baseline  Unable to stand from standard chair without heavy UE assist. 10/6, pt unable to stand from standard chair without UE support    Time  4    Period  Weeks    Status  On-going    Target Date  08/02/19        Prolonged standing balance/ ther.ex. remains limited by low back discomfort. No radicular symptoms but increase standing flexed posture/ UE assist in //-bars as back pain progresses. Pt. completed all ther.ex./ walking in clinic with 5# ankle wts. and no increase c/o knee pain. No LOB during tx. session and moderate fatigue noted after completion of Nustep. No change to HEP and pt. encouraged to increase walking time/ distance over the weekend.     Plan - 07/18/19 1408    Stability/Clinical Decision Making  Evolving/Moderate complexity    Clinical Decision Making  Moderate    Rehab Potential  Good     PT Frequency  2x / week    PT Duration  4 weeks    PT Treatment/Interventions  ADLs/Self Care Home Management;Therapeutic activities;Functional mobility training;Stair training;Gait training;Therapeutic exercise;Balance training;Neuromuscular re-education;Patient/family education;Manual  techniques;Passive range of motion    PT Next Visit Plan  weight shifting exercises, gait mechanics (decrease UE assist), LE strengthening/ endurance    PT Home Exercise Plan  See handouts    Consulted and Agree with Plan of Care  Patient;Family member/caregiver    Family Member Consulted  spouse: [name]       Patient will benefit from skilled therapeutic intervention in order to improve the following deficits and impairments:  Abnormal gait, Decreased balance, Decreased endurance, Decreased mobility, Difficulty walking, Decreased range of motion, Decreased activity tolerance, Decreased strength, Impaired flexibility, Postural dysfunction, Pain  Visit Diagnosis: Muscle weakness (generalized)  Gait difficulty  Abnormal posture     Problem List Patient Active Problem List   Diagnosis Date Noted  . Lymphedema 04/15/2019  . Bilateral leg edema 01/25/2019  . Hyponatremia 12/24/2018  . SIADH (syndrome of inappropriate ADH production) ([place]) 12/24/2018  . Erosion of urethra due to catheterization of urinary tract ([place]) 10/27/2018  . Generalized weakness 10/14/2018  . Hip fracture ([place]) 09/15/2018  . 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 HLD (hyperlipidemia) 03/24/2018  . Contusion of right knee 02/14/2018  . Bradycardia 12/28/2017  . 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 Chronic pain of right knee 08/10/2016  . Right ankle pain 08/10/2016  . 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  . 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[place]) 12/11/2015  . Urinary retention 09/08/2015  . Lymphoma,  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[place]) 04/03/2015  . Breathlessness on exertion 11/21/2014  . Breath shortness 11/21/2014  . Arthropathy 11/07/2014  . Atrial flutter, paroxysmal ([place]) 11/07/2014  . Type 2 diabetes mellitus ([place]) 11/07/2014  . Benign essential tremor 11/07/2014  . Benign essential HTN 11/07/2014  . Mixed incontinence 11/07/2014  . Hypercholesterolemia without hypertriglyceridemia 11/07/2014  . Apnea, sleep 11/07/2014  . Controlled type 2 diabetes mellitus without complication ([place]) 0000000  . Pure hypercholesterolemia 11/07/2014  . Other abnormalities of gait and mobility 11/02/2011  . Decreased mobility 11/02/2011  . Abnormal gait 06/29/2011  . Discoordination 06/29/2011   Pura Spice, PT, DPT # [phone] 07/18/2019, 2:10 PM  [facility] [address] [place], [place], [address] Phone: [phone]   Fax:  [phone]  Name: Mike Holloway MRN: [id number] Date of Birth: May 04, 1941

## 2019-07-18 NOTE — Therapy (Signed)
[facility] [address] [place], [place], [address] Phone: [phone]   Fax:  [phone]  Physical Therapy Treatment  Patient Details  Name: Mike Holloway MRN: [id number] Date of Birth: 24-Dec-1940 Referring Provider (PT): Dr. Rudene Christians   Encounter Date: 07/16/2019  PT End of Session - 07/18/19 0828    Visit Number  19    Number of Visits  24    Date for PT Re-Evaluation  08/02/19    Authorization - Visit Number  4    Authorization - Number of Visits  10    PT Start Time  [id number]    PT Stop Time  1145    PT Time Calculation (min)  64 min    Equipment Utilized During Treatment  Gait belt    Activity Tolerance  Patient tolerated treatment well;Patient limited by pain    Behavior During Therapy  [facility] for tasks assessed/performed       Past Medical History:  Diagnosis Date  . Arthritis   . Atrial flutter ([place])   . Diabetes mellitus ([place])   . Essential tremor   . Essential tremor    deep brain stimulator   . Hypercholesteremia   . Hypertension   . Incontinence   . 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 Sleep apnea   . Stroke [facility])     Past Surgical History:  Procedure Laterality Date  . ABLATION    . APPENDECTOMY    . CATARACT EXTRACTION, BILATERAL    . COLONOSCOPY WITH PROPOFOL N/A 03/17/2018   Procedure: COLONOSCOPY WITH PROPOFOL;  Surgeon: Jonathon Bellows, MD;  Location: [facility] ENDOSCOPY;  Service: Gastroenterology;  Laterality: N/A;  . DEEP BRAIN STIMULATOR PLACEMENT    . 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 ENDOSCOPY;  Service: Gastroenterology;  Laterality: N/A;  . HEMORRHOID SURGERY    . HERNIA REPAIR    . HIP FRACTURE SURGERY    . INTRAMEDULLARY (IM) NAIL INTERTROCHANTERIC Right 09/16/2018   Procedure: INTRAMEDULLARY (IM) NAIL INTERTROCHANTRIC;  Surgeon: Hessie Knows, MD;  Location: [facility] ORS;  Service: Orthopedics;  Laterality:  Right;  . IR CATHETER TUBE CHANGE  11/22/2018  . NASAL SINUS SURGERY    . ORCHIECTOMY    . TONSILLECTOMY      There were no vitals filed for this visit.  Subjective Assessment - 07/18/19 0742    Subjective  Pt reports 1/10 in R knee.  Pt reports had 4/10 back pain in low back earlier, is now 0/10 after taking Tylenol.  Pt states he has been doing his HEP and walking with rollator.    Patient is accompained by:  Family member    Limitations  Lifting;Standing;Walking;House hold activities    Patient Stated Goals  Improve LE strength/ gait and balance with daily tasks.  Improve gait/ safety/ progression to least assistive device.    Currently in Pain?  No/denies    Pain Onset  More than a month ago        Therapeutic Exercise: Nustep L5 x10 min Seated/standing LE there.ex. (no wts. Today): marching/ LAQ/ heel raises/ hip abduction/ knee flexion 20x (mirror feedback)  Neuro: Weight-shifting onto R LE - 2x8 with 10 sec holds Pre-gait weight-shifting on R LE with L step - 2x8 Weight-shifting onto R LE with L LE on airex pad - 2x8 with 15 sec holds, pt able to WB 80% on R LE with  progressive R UE WB (reduces to 60% after ~10 seconds) Fishing activities: dynamic balance Gait in clinic and out to car with emphasis on decreased UE WB during R LE stance, large steps, and tall posture     PT Long Term Goals - 07/05/19 1521      PT LONG TERM GOAL #1   Title  Pt. independent with HEP to increase B hip flexion/ R quad muscle strength 1/2 muscle grade to improve pain-free mobility.    Baseline  Decrease B LE muscle strength: hip flexion 4/5 MMT, L quad 4+/5 MMT, R quad 4/5 MMT, B hamstring 5/5 MMT, B DF/PF 5/5 MMT. B UE AROM WFL and strength grossly 5/5 MMT except R shoulder flexion 4+/5 MMT; hip flexion R/l: 4-/4, knee extension 4+/5, ankle PF/DF 5/5, hip abduction 5/5, hip adduction 4+/5 and poor compliance with HEP    Time  4    Period  Weeks    Status  On-going    Target Date  08/02/19       PT LONG TERM GOAL #2   Title  Pt. will increase Berg balance test to >40 out of 56 to improve independence with gait/ decrease fall risk.    Baseline  Berg: 22/56 (significant fall risk); 9/30 32/56    Time  4    Period  Weeks    Status  On-going    Target Date  08/02/19      PT LONG TERM GOAL #3   Title  Pt. able to ambulate 100 feet with consistent 2 point gait pattern and use of least restrictive assistive devce to improve household mobility.    Baseline  Pt. able to ambulate with use of RW/ CGA to min. A for safety.   Heavy use of B UE.; 9/30 pt able to ambulate with rollator with inconsistent 2 point gait pattern, heavy reliance on UE CGA/supervision    Time  8    Period  Weeks    Status  On-going      PT LONG TERM GOAL #4   Title  Pt. able to stand from normal chair with no UE assist to improve safety/independence with transfers.     Baseline  Unable to stand from standard chair without heavy UE assist. 10/6, pt unable to stand from standard chair without UE support    Time  4    Period  Weeks    Status  On-going    Target Date  08/02/19         Plan - 07/18/19 [id number]    Clinical Impression Statement  Pt. is tolerating longer static/dynamic standing tasks but remains limited by increase low back pain.  Pts. pain resolves quickly with short seated rest breaks.  Pt. ambulates with less UE force through RW while walking into PT but tends to increase UE assist/grasp while stepping over thresholds and walking outside.  Good understanding of ther.ex. program.    Stability/Clinical Decision Making  Evolving/Moderate complexity    Clinical Decision Making  Moderate    Rehab Potential  Good    PT Frequency  2x / week    PT Duration  4 weeks    PT Treatment/Interventions  ADLs/Self Care Home Management;Therapeutic activities;Functional mobility training;Stair training;Gait training;Therapeutic exercise;Balance training;Neuromuscular re-education;Patient/family education;Manual  techniques;Passive range of motion    PT Next Visit Plan  weight shifting exercises, gait mechanics (decrease UE assist), LE strengthening/ endurance    PT Home Exercise Plan  See handouts    Consulted and Agree  with Plan of Care  Patient;Family member/caregiver    Family Member Consulted  spouse: [name]       Patient will benefit from skilled therapeutic intervention in order to improve the following deficits and impairments:  Abnormal gait, Decreased balance, Decreased endurance, Decreased mobility, Difficulty walking, Decreased range of motion, Decreased activity tolerance, Decreased strength, Impaired flexibility, Postural dysfunction, Pain  Visit Diagnosis: Muscle weakness (generalized)  Gait difficulty  Abnormal posture     Problem List Patient Active Problem List   Diagnosis Date Noted  . Lymphedema 04/15/2019  . Bilateral leg edema 01/25/2019  . Hyponatremia 12/24/2018  . 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 Erosion of urethra due to catheterization of urinary tract ([place]) 10/27/2018  . Generalized weakness 10/14/2018  . Hip fracture ([place]) 09/15/2018  . 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 HLD (hyperlipidemia) 03/24/2018  . Contusion of right knee 02/14/2018  . Bradycardia 12/28/2017  . 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 Chronic pain of right knee 08/10/2016  . Right ankle pain 08/10/2016  . 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  . 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[place]) 12/11/2015  . Urinary retention 09/08/2015  . Lymphoma, 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[place]) 04/03/2015  . Breathlessness on exertion 11/21/2014  . Breath shortness 11/21/2014  . Arthropathy 11/07/2014  . Atrial flutter, paroxysmal ([place]) 11/07/2014  . Type 2 diabetes mellitus ([place]) 11/07/2014  . Benign essential tremor 11/07/2014  . Benign essential HTN 11/07/2014   . Mixed incontinence 11/07/2014  . Hypercholesterolemia without hypertriglyceridemia 11/07/2014  . Apnea, sleep 11/07/2014  . Controlled type 2 diabetes mellitus without complication ([place]) 0000000  . Pure hypercholesterolemia 11/07/2014  . Other abnormalities of gait and mobility 11/02/2011  . Decreased mobility 11/02/2011  . Abnormal gait 06/29/2011  . Discoordination 06/29/2011   Pura Spice, PT, DPT # [id number] 07/18/2019, 1:18 PM  [facility] [address] [place], [place], [address] Phone: [phone]   Fax:  [phone]  Name: Mike Holloway MRN: [id number] Date of Birth: 07-27-1941

## 2019-07-19 IMAGING — CT CT HEAD W/O CM
3 series · 15 of 47 positions shown, 18 images · non-contrast
Comparison: None.

CLINICAL DATA: Speech difficulty and vertigo

EXAM:
CT HEAD WITHOUT CONTRAST
TECHNIQUE: Contiguous axial images were obtained from the base of the skull
through the vertex without intravenous contrast.

[Series 3: head wo · axial · 0.42mm/px · z∈[-110,+20]mm · 9 of 32 slices shown, 12 images]
[im 3/32  brain]
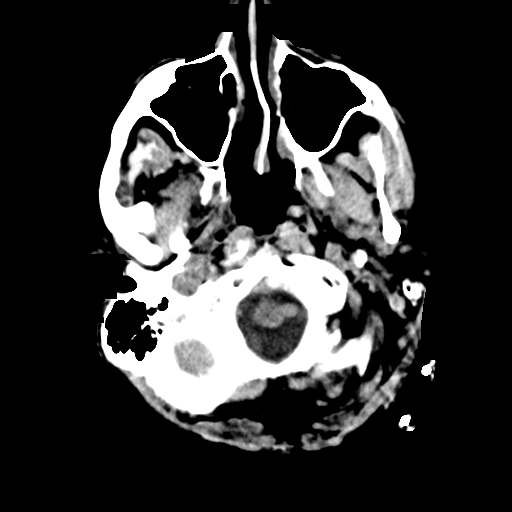
[im 3/32  bone]
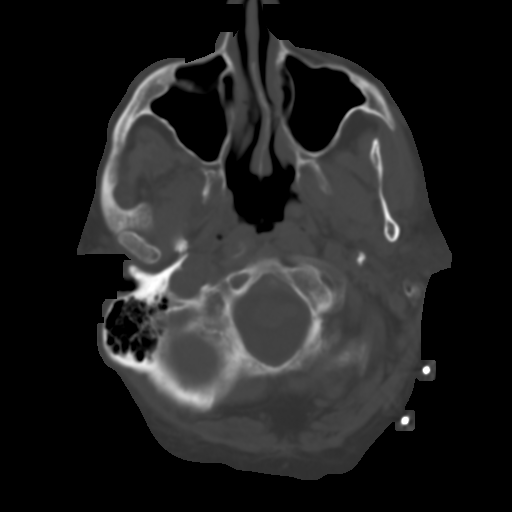
[im 6/32  brain]
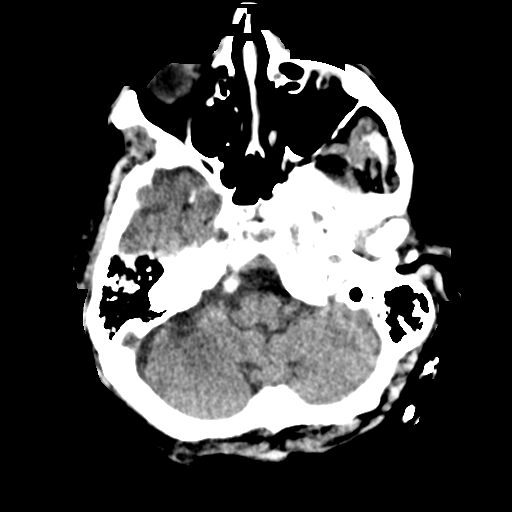
[im 9/32  brain]
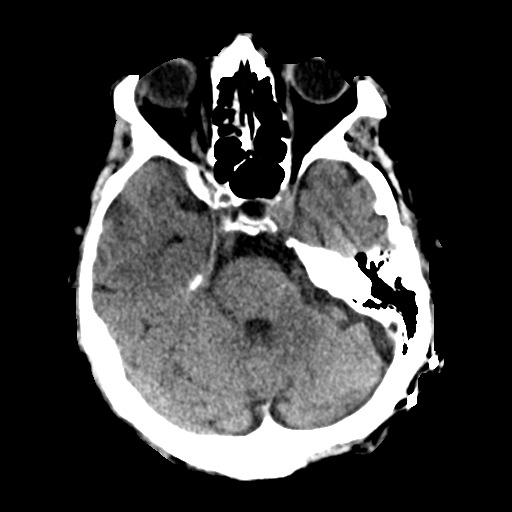
[im 12/32  brain]
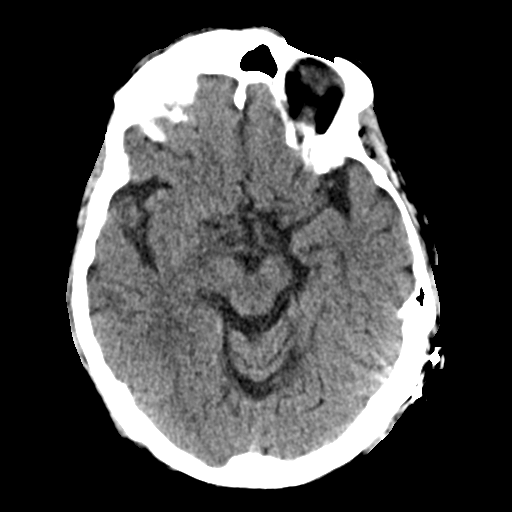
[im 17/32  brain]
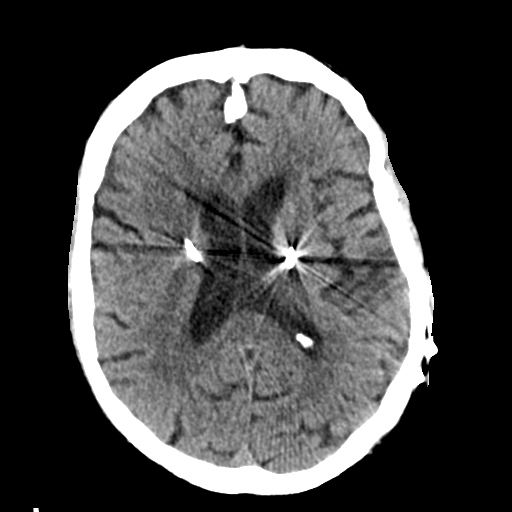
[im 17/32  bone]
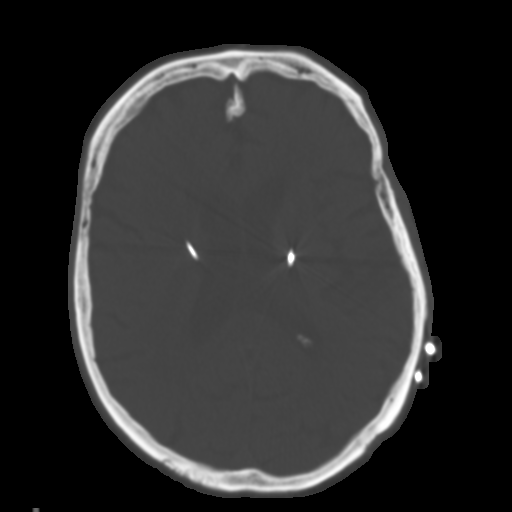
[im 20/32  brain]
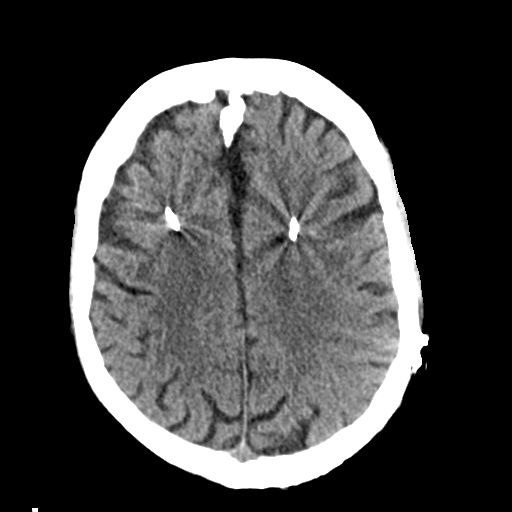
[im 23/32  brain]
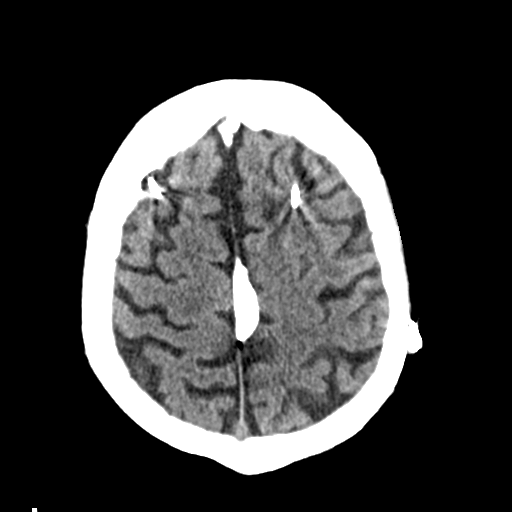
[im 26/32  brain]
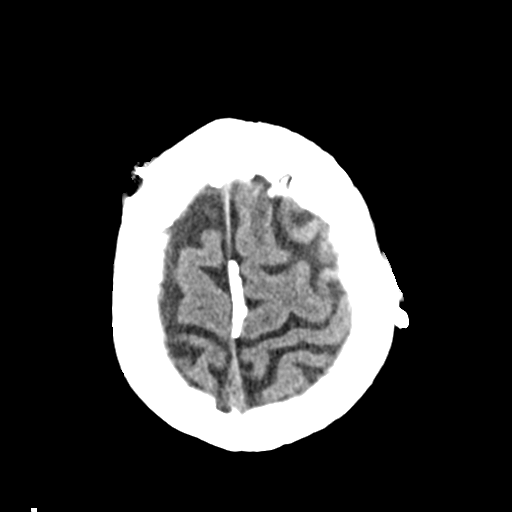
[im 29/32  brain]
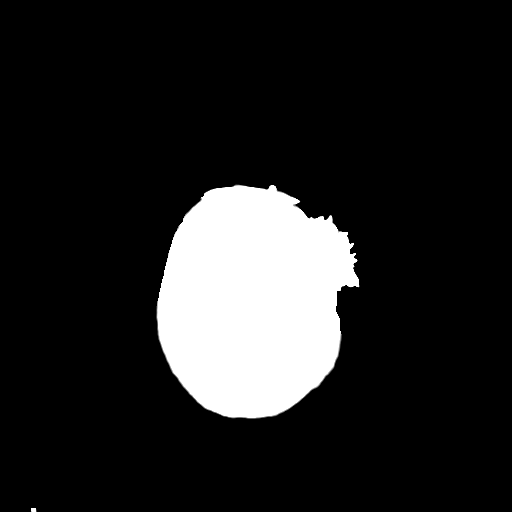
[im 29/32  bone]
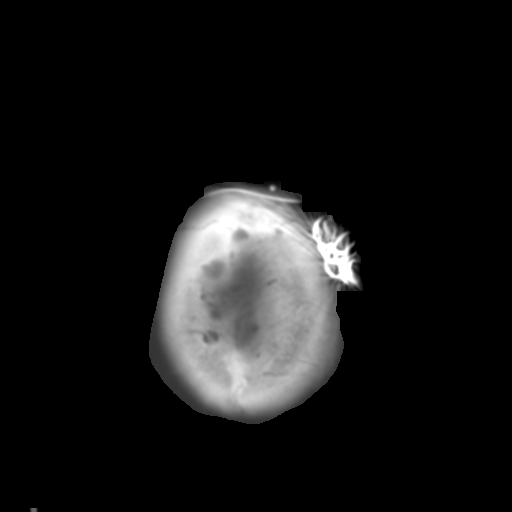

[Series 4: coronal soft tissue · coronal · 0.31mm/px · 3 of 63 slices shown]
[im 21/63  brain]
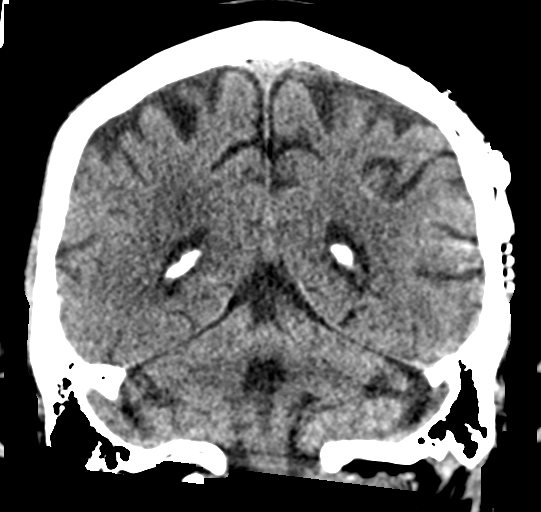
[im 28/63  brain]
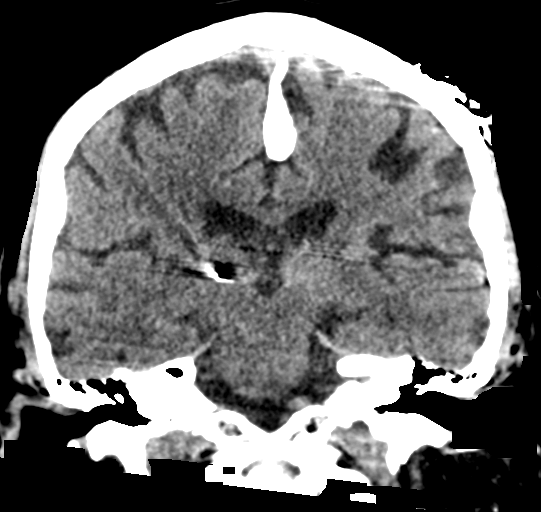
[im 35/63  brain]
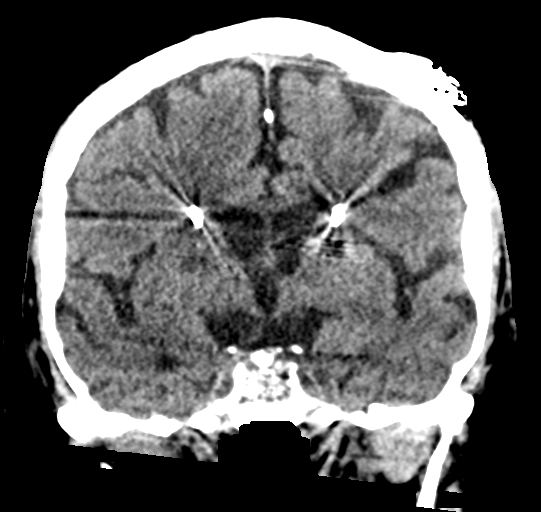

[Series 5: sagittal soft tissue · sagittal · 0.31mm/px · 3 of 54 slices shown]
[im 20/54  brain]
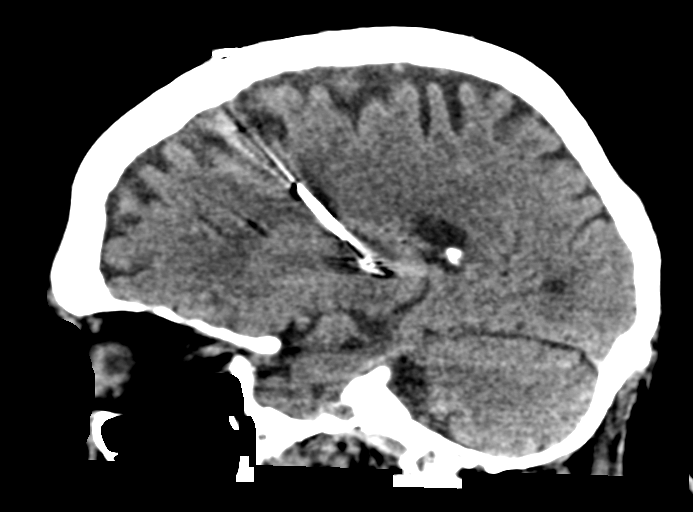
[im 27/54  brain]
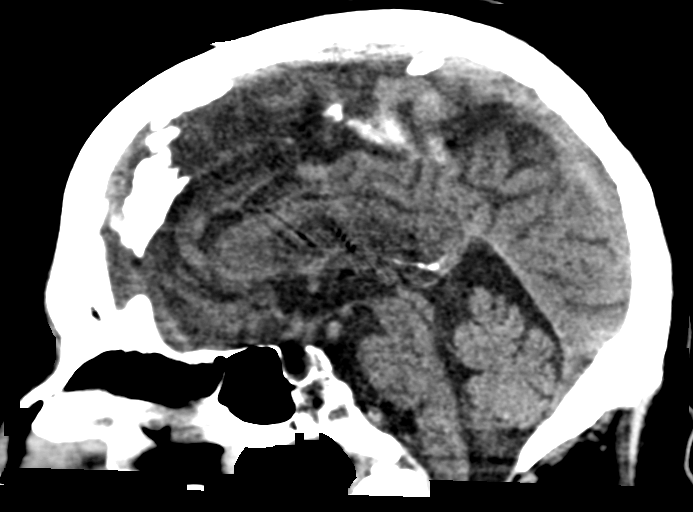
[im 35/54  brain]
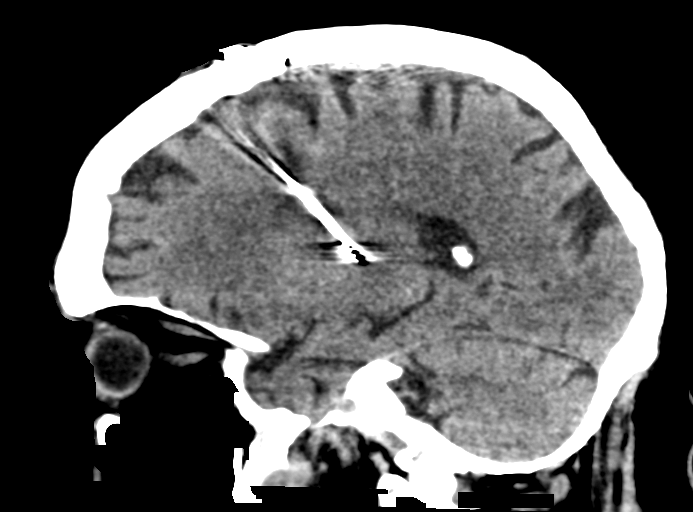

[15 of 47 positions shown; findings below may reference images not displayed]

FINDINGS: Brain: No acute hemorrhage. No extra-axial collection or mass
effect. There is mild generalized volume loss. There are bifrontal
approach deep brain stimulator leads with tips in the deep gray
nuclei. There is an old left cerebellar infarct.

Vascular: No abnormal hyperdensity of the major intracranial
arteries or dural venous sinuses. No intracranial atherosclerosis.

Skull: Bifrontal burr holes.

Sinuses/Orbits: No fluid levels or advanced mucosal thickening of
the visualized paranasal sinuses. No mastoid or middle ear effusion.
The orbits are normal.
IMPRESSION: 1. No acute intracranial abnormality.
2. Unchanged appearance of bifrontal approach deep brain stimulator
leads.

## 2019-07-23 ENCOUNTER — Ambulatory Visit: Payer: Medicare Other

## 2019-07-23 ENCOUNTER — Encounter: Payer: Self-pay | Admitting: Physical Therapy

## 2019-07-23 ENCOUNTER — Other Ambulatory Visit: Payer: Self-pay

## 2019-07-23 DIAGNOSIS — R269 Unspecified abnormalities of gait and mobility: Secondary | ICD-10-CM

## 2019-07-23 DIAGNOSIS — R293 Abnormal posture: Secondary | ICD-10-CM

## 2019-07-23 DIAGNOSIS — M6281 Muscle weakness (generalized): Secondary | ICD-10-CM | POA: Diagnosis not present

## 2019-07-23 NOTE — Therapy (Signed)
[facility] [address] [place], [place], [address] Phone: [phone]   Fax:  [phone]  Physical Therapy Treatment  Patient Details  Name: Mike Holloway MRN: [id number] Date of Birth: [birth year] Referring Provider (PT): Dr. Rudene Christians   Encounter Date: 07/23/2019  PT End of Session - 07/23/19 1422    Visit Number  21    Number of Visits  24    Date for PT Re-Evaluation  08/02/19    Authorization - Visit Number  6    Authorization - Number of Visits  10    PT Start Time  [id number]    PT Stop Time  1430    PT Time Calculation (min)  45 min    Equipment Utilized During Treatment  Gait belt    Activity Tolerance  Patient tolerated treatment well;Patient limited by pain    Behavior During Therapy  [facility] for tasks assessed/performed       Past Medical History:  Diagnosis Date  . Arthritis   . Atrial flutter ([place])   . Diabetes mellitus ([place])   . Essential tremor   . Essential tremor    deep brain stimulator   . Hypercholesteremia   . Hypertension   . Incontinence   . 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[place])    grew on the testical  . Sleep apnea   . Stroke [facility])     Past Surgical History:  Procedure Laterality Date  . ABLATION    . APPENDECTOMY    . CATARACT EXTRACTION, BILATERAL    . COLONOSCOPY WITH PROPOFOL N/A 03/17/2018   Procedure: COLONOSCOPY WITH PROPOFOL;  Surgeon: Jonathon Bellows, MD;  Location: [facility] ENDOSCOPY;  Service: Gastroenterology;  Laterality: N/A;  . DEEP BRAIN STIMULATOR PLACEMENT    . 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 HEMORRHOID SURGERY    . HERNIA REPAIR    . HIP FRACTURE SURGERY    . INTRAMEDULLARY (IM) NAIL INTERTROCHANTERIC Right 09/16/2018   Procedure: INTRAMEDULLARY (IM) NAIL INTERTROCHANTRIC;  Surgeon: Hessie Knows, MD;  Location: [facility] ORS;  Service: Orthopedics;  Laterality:  Right;  . IR CATHETER TUBE CHANGE  11/22/2018  . NASAL SINUS SURGERY    . ORCHIECTOMY    . TONSILLECTOMY      There were no vitals filed for this visit.  Subjective Assessment - 07/23/19 1356    Subjective  Patient reported having a busy weekend, social distanced with family for visits. Pt reported he has only been walking with rollator at home for HEP, "some" of marching.    Patient is accompained by:  Family member    Limitations  Lifting;Standing;Walking;House hold activities    Patient Stated Goals  Improve LE strength/ gait and balance with daily tasks.  Improve gait/ safety/ progression to least assistive device.    Currently in Pain?  Yes    Pain Score  1     Pain Location  Knee    Pain Orientation  Right    Pain Descriptors / Indicators  Aching         Therapeutic Exercise:  Nustep L5x10 min. Seated/standing LE there.ex. (5#): seated heel/toe raises/ marching/ LAQ/ standing heel raises/ hip abduction/ hip extension/ knee flexion 2x20.   Sit to stand with 1-2 UE assist on armrest (in //-bars).  x10, CGA   Neuro:  Forward/backward walking in //-bars working on step length/ maintaining adequate BOS and  upright posture.  Pt. Completed with 5# ankle wts.   Standing weight shifts to promote weight bearing in RLE x8 in // (with 5# ankle weights) Ambulation in bars with CGAx2 and R knee block as needed with cues for unilateral UE support. Poor carryover, needs more instruction Gait in clinic and out to car with emphasis on decreased UE WB during R LE stance, large steps, and tall posture    Pt response/clinical impression. During ambulation in parallel bars, pt was able to take 1-2 steps this session with unilateral support. With significant questioning, pt reported difficulty with decreased UE support due to feeling of instability and R knee pain. Family also expressed updated HEP next session to promote compliance at home (son has planned to see his dad on lunch breaks and  work with him). The patient would benefit from further skilled PT intervention to maximize mobility, safety, and independence.      PT Education - 07/23/19 1406    Education Details  Pt educated on Engineer, production) Educated  Patient    Methods  Explanation;Demonstration    Comprehension  Verbalized understanding;Returned demonstration;Need further instruction          PT Long Term Goals - 07/05/19 1521      PT LONG TERM GOAL #1   Title  Pt. independent with HEP to increase B hip flexion/ R quad muscle strength 1/2 muscle grade to improve pain-free mobility.    Baseline  Decrease B LE muscle strength: hip flexion 4/5 MMT, L quad 4+/5 MMT, R quad 4/5 MMT, B hamstring 5/5 MMT, B DF/PF 5/5 MMT. B UE AROM WFL and strength grossly 5/5 MMT except R shoulder flexion 4+/5 MMT; hip flexion R/l: 4-/4, knee extension 4+/5, ankle PF/DF 5/5, hip abduction 5/5, hip adduction 4+/5 and poor compliance with HEP    Time  4    Period  Weeks    Status  On-going    Target Date  08/02/19      PT LONG TERM GOAL #2   Title  Pt. will increase Berg balance test to >40 out of 56 to improve independence with gait/ decrease fall risk.    Baseline  Berg: 22/56 (significant fall risk); 9/30 32/56    Time  4    Period  Weeks    Status  On-going    Target Date  08/02/19      PT LONG TERM GOAL #3   Title  Pt. able to ambulate 100 feet with consistent 2 point gait pattern and use of least restrictive assistive devce to improve household mobility.    Baseline  Pt. able to ambulate with use of RW/ CGA to min. A for safety.   Heavy use of B UE.; 9/30 pt able to ambulate with rollator with inconsistent 2 point gait pattern, heavy reliance on UE CGA/supervision    Time  8    Period  Weeks    Status  On-going      PT LONG TERM GOAL #4   Title  Pt. able to stand from normal chair with no UE assist to improve safety/independence with transfers.     Baseline  Unable to stand from standard chair  without heavy UE assist. 10/6, pt unable to stand from standard chair without UE support    Time  4    Period  Weeks    Status  On-going    Target Date  08/02/19  Plan - 07/23/19 1422    Clinical Impression Statement  During ambulation in parallel bars, pt was able to take 1-2 steps this session with unilateral support. With significant questioning, pt reported difficulty with decreased UE support due to feeling of instability and R knee pain. Family also expressed updated HEP next session to promote compliance at home (son has planned to see his dad on lunch breaks and work with him). The patient would benefit from further skilled PT intervention to maximize mobility, safety, and independence.    Rehab Potential  Good    PT Frequency  2x / week    PT Duration  4 weeks    PT Treatment/Interventions  ADLs/Self Care Home Management;Therapeutic activities;Functional mobility training;Stair training;Gait training;Therapeutic exercise;Balance training;Neuromuscular re-education;Patient/family education;Manual techniques;Passive range of motion    PT Next Visit Plan  weight shifting exercises, gait mechanics (decrease UE assist), LE strengthening/ endurance    PT Home Exercise Plan  See handouts    Consulted and Agree with Plan of Care  Patient;Family member/caregiver       Patient will benefit from skilled therapeutic intervention in order to improve the following deficits and impairments:  Abnormal gait, Decreased balance, Decreased endurance, Decreased mobility, Difficulty walking, Decreased range of motion, Decreased activity tolerance, Decreased strength, Impaired flexibility, Postural dysfunction, Pain  Visit Diagnosis: Muscle weakness (generalized)  Gait difficulty  Abnormal posture     Problem List Patient Active Problem List   Diagnosis Date Noted  . Lymphedema 04/15/2019  . Bilateral leg edema 01/25/2019  . Hyponatremia 12/24/2018  . SIADH (syndrome of  inappropriate ADH production) ([place]) 12/24/2018  . Erosion of urethra due to catheterization of urinary tract ([place]) 10/27/2018  . Generalized weakness 10/14/2018  . Hip fracture ([place]) 09/15/2018  . 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 HLD (hyperlipidemia) 03/24/2018  . Contusion of right knee 02/14/2018  . Bradycardia 12/28/2017  . 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 Chronic pain of right knee 08/10/2016  . Right ankle pain 08/10/2016  . 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  . 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[place]) 12/11/2015  . Urinary retention 09/08/2015  . Lymphoma, 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[place]) 04/03/2015  . Breathlessness on exertion 11/21/2014  . Breath shortness 11/21/2014  . Arthropathy 11/07/2014  . Atrial flutter, paroxysmal ([place]) 11/07/2014  . Type 2 diabetes mellitus ([place]) 11/07/2014  . Benign essential tremor 11/07/2014  . Benign essential HTN 11/07/2014  . Mixed incontinence 11/07/2014  . Hypercholesterolemia without hypertriglyceridemia 11/07/2014  . Apnea, sleep 11/07/2014  . Controlled type 2 diabetes mellitus without complication ([place]) 0000000  . Pure hypercholesterolemia 11/07/2014  . Other abnormalities of gait and mobility 11/02/2011  . Decreased mobility 11/02/2011  . Abnormal gait 06/29/2011  . Discoordination 06/29/2011    Lieutenant Diego PT, DPT 2:49 PM,07/23/19 [phone]  [facility] [address] [place], [place], [address] Phone: [phone]   Fax:  [phone]  Name: Mike Holloway MRN: [id number] Date of Birth: [birth year]

## 2019-07-24 ENCOUNTER — Telehealth: Payer: Self-pay | Admitting: Urology

## 2019-07-24 NOTE — Telephone Encounter (Signed)
Spoke with patient wife, she doesn't think patient is retaining any urine but agrees with evaluation.  Appointment scheduled.

## 2019-07-24 NOTE — Telephone Encounter (Signed)
Pt wife Mike Holloway) calling office concerned about her husbands increased volume of urine.  States pt has fewer dry times, less control, also when [place] stands up he completely soaked thru "flood" and still had 250 once he made it to the restroom. Wife is concerned and says she's frustrated as well as the pt and wants to speak to someone about the changes made in his medication at last appt. Please call [name] at [phone]. Thanks.

## 2019-07-24 NOTE — Telephone Encounter (Signed)
We need to make sure that Mike Holloway is emptying his bladder as this may be overflow incontinence.  I would suggest they come in tomorrow for a bladder scan for PVR and further discussion.

## 2019-07-25 ENCOUNTER — Encounter: Payer: Self-pay | Admitting: Physical Therapy

## 2019-07-25 ENCOUNTER — Encounter: Payer: Self-pay | Admitting: Urology

## 2019-07-25 ENCOUNTER — Ambulatory Visit (INDEPENDENT_AMBULATORY_CARE_PROVIDER_SITE_OTHER): Payer: Medicare Other | Admitting: Urology

## 2019-07-25 ENCOUNTER — Ambulatory Visit: Payer: Medicare Other

## 2019-07-25 ENCOUNTER — Other Ambulatory Visit: Payer: Self-pay

## 2019-07-25 VITALS — BP 124/76 | HR 72 | Ht 68.0 in | Wt 210.0 lb

## 2019-07-25 DIAGNOSIS — M6281 Muscle weakness (generalized): Secondary | ICD-10-CM

## 2019-07-25 DIAGNOSIS — R339 Retention of urine, unspecified: Secondary | ICD-10-CM

## 2019-07-25 DIAGNOSIS — R351 Nocturia: Secondary | ICD-10-CM

## 2019-07-25 DIAGNOSIS — N39498 Other specified urinary incontinence: Secondary | ICD-10-CM | POA: Diagnosis not present

## 2019-07-25 DIAGNOSIS — R269 Unspecified abnormalities of gait and mobility: Secondary | ICD-10-CM

## 2019-07-25 DIAGNOSIS — R293 Abnormal posture: Secondary | ICD-10-CM

## 2019-07-25 LAB — BLADDER SCAN AMB NON-IMAGING

## 2019-07-25 NOTE — Progress Notes (Signed)
07/25/2019 12:05 PM   Mike Holloway 10/16/1940 [id number]  Referring provider: [name], MD [place],  [place] [address]  Chief Complaint  Patient presents with  . Urinary Retention    HPI: Mike Holloway is a 78 year old male with a history of urinary retention who presents today for follow up with his wife, Mike Holloway, increased leakage.  His wife states that he has now had fever dry times, less control of bladder function and when her husband stands up he completely soaks through his depends.  Patient denies any gross hematuria, dysuria or suprapubic/flank pain.  Patient denies any fevers, chills, nausea or vomiting.   They have discontinued the Myrbetriq 50 mg daily as instructed and have restarted the tamsulosin 0.4 mg daily.    Bladder scan is 634 mL.  Mike Holloway asked for more time to empty his bladder and was able to void 350 mL in the room with an urinal.    SPT was removed on 05/10/2019.      PMH: Past Medical History:  Diagnosis Date  . Arthritis   . Atrial flutter ([place])   . Diabetes mellitus ([place])   . Essential tremor   . Essential tremor    deep brain stimulator   . Hypercholesteremia   . Hypertension   . Incontinence   . 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[place])    grew on the testical  . Sleep apnea   . Stroke [facility])     Surgical History: Past Surgical History:  Procedure Laterality Date  . ABLATION    . APPENDECTOMY    . CATARACT EXTRACTION, BILATERAL    . COLONOSCOPY WITH PROPOFOL N/A 03/17/2018   Procedure: COLONOSCOPY WITH PROPOFOL;  Surgeon: Jonathon Bellows, MD;  Location: [facility] ENDOSCOPY;  Service: Gastroenterology;  Laterality: N/A;  . DEEP BRAIN STIMULATOR PLACEMENT    . 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 HEMORRHOID SURGERY    . HERNIA REPAIR    . HIP FRACTURE SURGERY    . INTRAMEDULLARY (IM) NAIL  INTERTROCHANTERIC Right 09/16/2018   Procedure: INTRAMEDULLARY (IM) NAIL INTERTROCHANTRIC;  Surgeon: Hessie Knows, MD;  Location: [facility] ORS;  Service: Orthopedics;  Laterality: Right;  . IR CATHETER TUBE CHANGE  11/22/2018  . NASAL SINUS SURGERY    . ORCHIECTOMY    . TONSILLECTOMY      Home Medications:  Allergies as of 07/25/2019      Reactions   Clindamycin Diarrhea   Contracted C. Diff x 2   Flagyl [metronidazole] Swelling   Swollen tongue, excessive shaking,     Ambien  [zolpidem]    Other reaction(s): Other (See Comments) Disoriented and moody Other reaction(s): Other (See Comments) Disoriented and moody   Penicillins Rash   Has patient had a PCN reaction causing immediate rash, facial/tongue/throat swelling, SOB or lightheadedness with hypotension: Unknown Has patient had a PCN reaction causing severe rash involving mucus membranes or skin necrosis: Unknown Has patient had a PCN reaction that required hospitalization: No Has patient had a PCN reaction occurring within the last 10 years: No If all of the above answers are "NO", then may proceed with Cephalosporin use.   Sulfa Antibiotics Rash      Medication List       Accurate as of July 25, 2019 12:05 PM. If you have any questions, ask your nurse or doctor.  acetaminophen 650 MG CR tablet Commonly known as: TYLENOL Take 650 mg by mouth every 8 (eight) hours as needed for pain.   aspirin EC 325 MG tablet Take 1 tablet (325 mg total) by mouth daily.   Azelastine HCl 0.15 % Soln U 1 SPRAY IEN BID   cephALEXin 500 MG capsule Commonly known as: Keflex Take 1 capsule (500 mg total) by mouth 4 (four) times daily.   cholecalciferol 25 MCG (1000 UT) tablet Commonly known as: VITAMIN D Take 1,000 Units by mouth at bedtime.   diclofenac sodium 1 % Gel Commonly known as: VOLTAREN Apply 2 g topically 2 (two) times daily as needed (pain).   famotidine 20 MG tablet Commonly known as: PEPCID Take 20 mg by  mouth 2 (two) times daily.   fexofenadine 180 MG tablet Commonly known as: ALLEGRA TK 1 T PO ONCE D   FLUoxetine 10 MG capsule Commonly known as: PROZAC Take 10 mg by mouth daily.   guaiFENesin-dextromethorphan 100-10 MG/5ML syrup Commonly known as: ROBITUSSIN DM Take 5 mLs by mouth every 4 (four) hours as needed for cough.   Melatonin 5 MG Tabs Take 5 mg by mouth at bedtime. Takes 2 3 mg tablets   mirabegron ER 50 MG Tb24 tablet Commonly known as: MYRBETRIQ Take 1 tablet (50 mg total) by mouth daily.   primidone 50 MG tablet Commonly known as: MYSOLINE Take 50-100 mg by mouth 2 (two) times daily. Take 2 tablets (100mg ) by mouth every morning and 1 tablet (50mg ) by mouth every night at bedtime   propranolol ER 160 MG SR capsule Commonly known as: INDERAL LA Take 160 mg by mouth daily.   psyllium 95 % Pack Commonly known as: HYDROCIL/METAMUCIL Take 1 packet by mouth daily.   UNABLE TO FIND APPLY FROM NECK DOWN DAILY   VSL#3 Caps Take 1 capsule by mouth daily.       Allergies:  Allergies  Allergen Reactions  . Clindamycin Diarrhea    Contracted C. Diff x 2  . Flagyl [Metronidazole] Swelling    Swollen tongue, excessive shaking,    . Ambien  [Zolpidem]     Other reaction(s): Other (See Comments) Disoriented and moody Other reaction(s): Other (See Comments) Disoriented and moody  . Penicillins Rash    Has patient had a PCN reaction causing immediate rash, facial/tongue/throat swelling, SOB or lightheadedness with hypotension: Unknown Has patient had a PCN reaction causing severe rash involving mucus membranes or skin necrosis: Unknown Has patient had a PCN reaction that required hospitalization: No Has patient had a PCN reaction occurring within the last 10 years: No If all of the above answers are "NO", then may proceed with Cephalosporin use.  . Sulfa Antibiotics Rash    Family History: Family History  Problem Relation Age of Onset  . Hematuria Father    . Prostate cancer Father   . Diabetes Father   . Heart disease Mother   . Diabetes Mother   . Kidney disease Neg Hx   . Bladder Cancer Neg Hx     Social History:  reports that he has never smoked. He has never used smokeless tobacco. He reports that he does not drink alcohol or use drugs.  ROS: UROLOGY Frequent Urination?: No Hard to postpone urination?: Yes Burning/pain with urination?: No Get up at night to urinate?: No Leakage of urine?: Yes Urine stream starts and stops?: Yes Trouble starting stream?: Yes Do you have to strain to urinate?: No Blood in urine?: No Urinary tract  infection?: No Sexually transmitted disease?: No Injury to kidneys or bladder?: No Painful intercourse?: No Weak stream?: No Erection problems?: No Penile pain?: No  Gastrointestinal Nausea?: No Vomiting?: No Indigestion/heartburn?: Yes Diarrhea?: No Constipation?: No  Constitutional Fever: No Night sweats?: No Weight loss?: No Fatigue?: No  Skin Skin rash/lesions?: No Itching?: No  Eyes Blurred vision?: No Double vision?: No  Ears/Nose/Throat Sore throat?: No Sinus problems?: No  Hematologic/Lymphatic Swollen glands?: No Easy bruising?: No  Cardiovascular Leg swelling?: No Chest pain?: No  Respiratory Cough?: No Shortness of breath?: No  Endocrine Excessive thirst?: No  Musculoskeletal Back pain?: Yes Joint pain?: Yes  Neurological Headaches?: No Dizziness?: No  Psychologic Depression?: Yes Anxiety?: No  Physical Exam: Blood pressure 124/76, pulse 72, height 5\' 8"  (1.727 m), weight 210 lb (95.3 kg). Constitutional:  Well nourished. Alert and oriented, No acute distress. HEENT: [place] AT, moist mucus membranes.  Trachea midline, no masses. Cardiovascular: No clubbing, cyanosis, or edema. Respiratory: Normal respiratory effort, no increased work of breathing. Neurologic: Grossly intact, no focal deficits, moving all 4 extremities. Psychiatric: Normal mood  and affect.  Laboratory Data: Urinalysis Component     Latest Ref Rng & Units 07/16/2019  Color, Urine     YELLOW YELLOW  Appearance     CLEAR CLEAR  Specific Gravity, Urine     1.005 - 1.030 1.025  pH     5.0 - 8.0 6.0  Glucose, UA     NEGATIVE mg/dL NEGATIVE  Hgb urine dipstick     NEGATIVE TRACE (A)  Bilirubin Urine     NEGATIVE NEGATIVE  Ketones, ur     NEGATIVE mg/dL NEGATIVE  Protein     NEGATIVE mg/dL NEGATIVE  Nitrite     NEGATIVE NEGATIVE  Leukocytes,Ua     NEGATIVE NEGATIVE  Squamous Epithelial / LPF     0 - 5 0-5  WBC, UA     0 - 5 WBC/hpf 0-5  RBC / HPF     0 - 5 RBC/hpf 0-5  Bacteria, UA     NONE SEEN NONE SEEN  Mucus      PRESENT  I have reviewed the labs.   Pertinent Imaging  Results for DIVIT, HAAKE (MRN [id number]) as of 08/08/2019 09:04  Ref. Range 07/25/2019 11:25  Scan Result Unknown 685mL    Assessment & Plan:    1. History of urinary retention Continue tamsulosin 0.4 mg daily Discussed with patient and wife that his bladder function needs further studies and I recommended urodynamics.  I explained to the patient that urodynamics is a study that assesses how the bladder and urethra are performing their job of storing and releasing urine.  Urodynamic tests can help explain symptoms such as: incontinence, frequent urination, urgency, hesitancy, dysuria, urinary retention and/or recurrent UTI's.  To perform the test, special catheter is placed into the urethra, a rectal catheter is placed and electrodes are placed around the perineum.  The bladder is filled with water and the patient will be asked to void, it they can.  There may be a video component involved at some center, where the bladder activity is monitored by a video The patient and his wife would like to proceed with UDS at [facility] as most of their other specialists are located at that facility  2. Incontinence Likely overflow incontinence  3. Nocturia Patient has  acquired a CPAP machine and has been sleeping with it nightly  Zara Council, [facility] [address]  [address] [place], [place] [address] [phone]

## 2019-07-25 NOTE — Patient Instructions (Signed)
Access Code: [email or web address]  URL: [email or web address]  Date: 07/25/2019  Prepared by: Lieutenant Diego   Exercises  Seated March - 10 reps - 3 sets - 1x daily - 4x weekly  Seated Long Arc Quad - 10 reps - 3 sets - 1x daily - 4x weekly  Seated Heel Toe Raises - 10 reps - 3 sets - 1x daily - 4x weekly  Seated Hip Abduction with Resistance - 10 reps - 3 sets - 1x daily - 4x weekly  Seated Hamstring Curl with Anchored Resistance - 10 reps - 3 sets - 1x daily - 4x weekly Sit to Stand with Armchair - 10 reps - 2 sets - 1x daily - 4x weekly  Standing Balance - 1x daily - 7x weekly  Side to Side Weight Shift with Counter Support - 10 reps - 2 sets - 1x daily - 7x weekly  Walking - 1x daily - 7x weekly

## 2019-07-25 NOTE — Therapy (Signed)
[facility] [address]. [place], [place], [address] Phone: [phone]   Fax:  [phone]  Physical Therapy Treatment  Patient Details  Name: Mike Holloway MRN: [id number] Date of Birth: 1941/06/15 Referring Provider (PT): Dr. Rudene Christians   Encounter Date: 07/25/2019  PT End of Session - 07/25/19 1419    Visit Number  22    Number of Visits  24    Date for PT Re-Evaluation  08/02/19    Authorization - Visit Number  7    Authorization - Number of Visits  10    PT Start Time  [id number]    PT Stop Time  1430    PT Time Calculation (min)  38 min    Equipment Utilized During Treatment  Gait belt    Activity Tolerance  Patient tolerated treatment well;Patient limited by pain    Behavior During Therapy  [facility] for tasks assessed/performed       Past Medical History:  Diagnosis Date  . Arthritis   . Atrial flutter ([place])   . Diabetes mellitus ([place])   . Essential tremor   . Essential tremor    deep brain stimulator   . Hypercholesteremia   . Hypertension   . Incontinence   . 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[place])    grew on the testical  . Sleep apnea   . Stroke [facility])     Past Surgical History:  Procedure Laterality Date  . ABLATION    . APPENDECTOMY    . CATARACT EXTRACTION, BILATERAL    . COLONOSCOPY WITH PROPOFOL N/A 03/17/2018   Procedure: COLONOSCOPY WITH PROPOFOL;  Surgeon: Jonathon Bellows, MD;  Location: [facility] ENDOSCOPY;  Service: Gastroenterology;  Laterality: N/A;  . DEEP BRAIN STIMULATOR PLACEMENT    . 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 HEMORRHOID SURGERY    . HERNIA REPAIR    . HIP FRACTURE SURGERY    . INTRAMEDULLARY (IM) NAIL INTERTROCHANTERIC Right 09/16/2018   Procedure: INTRAMEDULLARY (IM) NAIL INTERTROCHANTRIC;  Surgeon: Hessie Knows, MD;  Location: [facility] ORS;  Service: Orthopedics;  Laterality:  Right;  . IR CATHETER TUBE CHANGE  11/22/2018  . NASAL SINUS SURGERY    . ORCHIECTOMY    . TONSILLECTOMY      There were no vitals filed for this visit.  Subjective Assessment - 07/25/19 1358    Subjective  Pt reports 1/10 pain in R knee; says he put biofreeze on this morning.  Pt reports taking advil before coming to PT today.    Patient is accompained by:  Family member    Limitations  Lifting;Standing;Walking;House hold activities    Patient Stated Goals  Improve LE strength/ gait and balance with daily tasks.  Improve gait/ safety/ progression to least assistive device.    Currently in Pain?  Yes    Pain Score  1     Pain Location  Knee    Pain Orientation  Right    Pain Descriptors / Indicators  Aching    Pain Type  Chronic pain        Therapeutic Exercise: Nustep L5 x4.5 min, L4 5.5 min with constant monitoring throughout, cues to maintain pace Standing endurance: 3 min, 3 min 10 sec, 3 min 36 sec Seated marching, LAQ, heel/ toe raises with 5# ankle weights - 2x20 each Weight shifting in // bars - 2x10 Standing endurance with fishing: 4  min 45 sec Walking endurance from clinic gym to car: 5 min  Total time on feet during therapy: ~20 min  Clinical Impression: Pt session focused on standing/ walking endurance; pt limited by R LBP with progressive WB at ~3 minutes of standing, but is able to stand longer (~4.5 minutes) with UE activity during standing endurance.  LBP resolves in sitting and pt is able to perform seated exercises without LBP.  Pt educated on HEP to promote standing and walking endurance at home with son.  Pt will benefit from further skilled therapy to improve gait mechanics/ endurance, increase LE strength, and maximize mobility/ independence.    PT Long Term Goals - 07/05/19 1521      PT LONG TERM GOAL #1   Title  Pt. independent with HEP to increase B hip flexion/ R quad muscle strength 1/2 muscle grade to improve pain-free mobility.    Baseline   Decrease B LE muscle strength: hip flexion 4/5 MMT, L quad 4+/5 MMT, R quad 4/5 MMT, B hamstring 5/5 MMT, B DF/PF 5/5 MMT. B UE AROM WFL and strength grossly 5/5 MMT except R shoulder flexion 4+/5 MMT; hip flexion R/l: 4-/4, knee extension 4+/5, ankle PF/DF 5/5, hip abduction 5/5, hip adduction 4+/5 and poor compliance with HEP    Time  4    Period  Weeks    Status  On-going    Target Date  08/02/19      PT LONG TERM GOAL #2   Title  Pt. will increase Berg balance test to >40 out of 56 to improve independence with gait/ decrease fall risk.    Baseline  Berg: 22/56 (significant fall risk); 9/30 32/56    Time  4    Period  Weeks    Status  On-going    Target Date  08/02/19      PT LONG TERM GOAL #3   Title  Pt. able to ambulate 100 feet with consistent 2 point gait pattern and use of least restrictive assistive devce to improve household mobility.    Baseline  Pt. able to ambulate with use of RW/ CGA to min. A for safety.   Heavy use of B UE.; 9/30 pt able to ambulate with rollator with inconsistent 2 point gait pattern, heavy reliance on UE CGA/supervision    Time  8    Period  Weeks    Status  On-going      PT LONG TERM GOAL #4   Title  Pt. able to stand from normal chair with no UE assist to improve safety/independence with transfers.     Baseline  Unable to stand from standard chair without heavy UE assist. 10/6, pt unable to stand from standard chair without UE support    Time  4    Period  Weeks    Status  On-going    Target Date  08/02/19            Plan - 07/25/19 1508    Clinical Impression Statement  Pt session focused on standing/ walking endurance; pt limited by R LBP with progressive WB at ~3 minutes of standing, but is able to stand longer (~4.5 minutes) with UE activity during standing endurance.  LBP resolves in sitting and pt is able to perform seated exercises without LBP.  Pt educated on HEP to promote standing and walking endurance at home with son.  Pt will  benefit from further skilled therapy to improve gait mechanics/ endurance, increase LE strength, and maximize  mobility/ independence.    Stability/Clinical Decision Making  Evolving/Moderate complexity    Clinical Decision Making  Moderate    Rehab Potential  Good    PT Frequency  2x / week    PT Duration  4 weeks    PT Treatment/Interventions  ADLs/Self Care Home Management;Therapeutic activities;Functional mobility training;Stair training;Gait training;Therapeutic exercise;Balance training;Neuromuscular re-education;Patient/family education;Manual techniques;Passive range of motion    PT Next Visit Plan  LE strengthening, standing/ walking endurance, weight shifting    PT Home Exercise Plan  [email or web address]    Consulted and Agree with Plan of Care  Patient;Family member/caregiver    Family Member Consulted  spouse: [name]       Patient will benefit from skilled therapeutic intervention in order to improve the following deficits and impairments:  Abnormal gait, Decreased balance, Decreased endurance, Decreased mobility, Difficulty walking, Decreased range of motion, Decreased activity tolerance, Decreased strength, Impaired flexibility, Postural dysfunction, Pain  Visit Diagnosis: Muscle weakness (generalized)  Gait difficulty  Abnormal posture     Problem List Patient Active Problem List   Diagnosis Date Noted  . Lymphedema 04/15/2019  . Bilateral leg edema 01/25/2019  . Hyponatremia 12/24/2018  . SIADH (syndrome of inappropriate ADH production) ([place]) 12/24/2018  . Erosion of urethra due to catheterization of urinary tract ([place]) 10/27/2018  . Generalized weakness 10/14/2018  . Hip fracture ([place]) 09/15/2018  . 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 HLD (hyperlipidemia) 03/24/2018  . Contusion of right knee 02/14/2018  . Bradycardia 12/28/2017  . Status post right unicompartmental knee  replacement 11/03/2017  . Chronic pain of right knee 08/10/2016  . Right ankle pain 08/10/2016  . 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  . 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[place]) 12/11/2015  . Urinary retention 09/08/2015  . Lymphoma, 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[place]) 04/03/2015  . Breathlessness on exertion 11/21/2014  . Breath shortness 11/21/2014  . Arthropathy 11/07/2014  . Atrial flutter, paroxysmal ([place]) 11/07/2014  . Type 2 diabetes mellitus ([place]) 11/07/2014  . Benign essential tremor 11/07/2014  . Benign essential HTN 11/07/2014  . Mixed incontinence 11/07/2014  . Hypercholesterolemia without hypertriglyceridemia 11/07/2014  . Apnea, sleep 11/07/2014  . Controlled type 2 diabetes mellitus without complication ([place]) 0000000  . Pure hypercholesterolemia 11/07/2014  . Other abnormalities of gait and mobility 11/02/2011  . Decreased mobility 11/02/2011  . Abnormal gait 06/29/2011  . Discoordination 06/29/2011    Chinita Greenland, SPT 07/25/2019, 3:34 PM  [place] [facility] [address]. [place], [place], [address] Phone: [phone]   Fax:  [phone]  Name: Mike Holloway MRN: [id number] Date of Birth: 1940/10/18

## 2019-07-30 ENCOUNTER — Other Ambulatory Visit: Payer: Self-pay

## 2019-07-30 ENCOUNTER — Encounter: Payer: Self-pay | Admitting: Physical Therapy

## 2019-07-30 ENCOUNTER — Ambulatory Visit: Payer: Medicare Other | Attending: Orthopedic Surgery | Admitting: Physical Therapy

## 2019-07-30 DIAGNOSIS — M6281 Muscle weakness (generalized): Secondary | ICD-10-CM | POA: Insufficient documentation

## 2019-07-30 DIAGNOSIS — R269 Unspecified abnormalities of gait and mobility: Secondary | ICD-10-CM | POA: Insufficient documentation

## 2019-07-30 DIAGNOSIS — R293 Abnormal posture: Secondary | ICD-10-CM | POA: Insufficient documentation

## 2019-07-30 NOTE — Therapy (Signed)
[facility] [address] [place], [place], [address] Phone: [phone]   Fax:  [phone]  Physical Therapy Treatment  Patient Details  Name: Mike Holloway MRN: [id number] Date of Birth: [birth year] Referring Provider (PT): Dr. Rudene Christians   Encounter Date: 07/30/2019  PT End of Session - 07/30/19 1042    Visit Number  23    Number of Visits  24    Date for PT Re-Evaluation  08/02/19    Authorization - Visit Number  8    Authorization - Number of Visits  10    PT Start Time  1034    PT Stop Time  1130    PT Time Calculation (min)  56 min    Equipment Utilized During Treatment  Gait belt    Activity Tolerance  Patient tolerated treatment well;Patient limited by pain    Behavior During Therapy  [facility] for tasks assessed/performed       Past Medical History:  Diagnosis Date  . Arthritis   . Atrial flutter ([place])   . Diabetes mellitus ([place])   . Essential tremor   . Essential tremor    deep brain stimulator   . Hypercholesteremia   . Hypertension   . Incontinence   . 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[place])    grew on the testical  . Sleep apnea   . Stroke [facility])     Past Surgical History:  Procedure Laterality Date  . ABLATION    . APPENDECTOMY    . CATARACT EXTRACTION, BILATERAL    . COLONOSCOPY WITH PROPOFOL N/A 03/17/2018   Procedure: COLONOSCOPY WITH PROPOFOL;  Surgeon: Jonathon Bellows, MD;  Location: [facility] ENDOSCOPY;  Service: Gastroenterology;  Laterality: N/A;  . DEEP BRAIN STIMULATOR PLACEMENT    . 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 HEMORRHOID SURGERY    . HERNIA REPAIR    . HIP FRACTURE SURGERY    . INTRAMEDULLARY (IM) NAIL INTERTROCHANTERIC Right 09/16/2018   Procedure: INTRAMEDULLARY (IM) NAIL INTERTROCHANTRIC;  Surgeon: Hessie Knows, MD;  Location: [facility] ORS;  Service: Orthopedics;  Laterality:  Right;  . IR CATHETER TUBE CHANGE  11/22/2018  . NASAL SINUS SURGERY    . ORCHIECTOMY    . TONSILLECTOMY      There were no vitals filed for this visit.  Subjective Assessment - 07/30/19 1034    Subjective  Pt. reports 1/10 pain in R knee. No new complaints.  Pt. using rollator more at home and less w/c use.    Patient is accompained by:  Family member    Limitations  Lifting;Standing;Walking;House hold activities    Patient Stated Goals  Improve LE strength/ gait and balance with daily tasks.  Improve gait/ safety/ progression to least assistive device.    Currently in Pain?  Yes    Pain Score  1     Pain Location  Knee    Pain Orientation  Right        Therapeutic Exercise:  Seated marching, LAQ, heel/ toe raises with 5# ankle weights - 2x20 each on blue mat table Standing weight shifting in // bars - 2x10 (light UE assist) Walking forward/ backwards/ lateral in //-bars with UE assist Attempted step up on 1st stair but unable due to increase R knee pain Seated alternate step touches L/R 20x Walking endurance from clinic gym to car: 6.5 min with rollator Nustep L5 10  min. B UE/LE (consistent cadence)     PT Long Term Goals - 07/05/19 1521      PT LONG TERM GOAL #1   Title  Pt. independent with HEP to increase B hip flexion/ R quad muscle strength 1/2 muscle grade to improve pain-free mobility.    Baseline  Decrease B LE muscle strength: hip flexion 4/5 MMT, L quad 4+/5 MMT, R quad 4/5 MMT, B hamstring 5/5 MMT, B DF/PF 5/5 MMT. B UE AROM WFL and strength grossly 5/5 MMT except R shoulder flexion 4+/5 MMT; hip flexion R/l: 4-/4, knee extension 4+/5, ankle PF/DF 5/5, hip abduction 5/5, hip adduction 4+/5 and poor compliance with HEP    Time  4    Period  Weeks    Status  On-going    Target Date  08/02/19      PT LONG TERM GOAL #2   Title  Pt. will increase Berg balance test to >40 out of 56 to improve independence with gait/ decrease fall risk.    Baseline  Berg: 22/56  (significant fall risk); 9/30 32/56    Time  4    Period  Weeks    Status  On-going    Target Date  08/02/19      PT LONG TERM GOAL #3   Title  Pt. able to ambulate 100 feet with consistent 2 point gait pattern and use of least restrictive assistive devce to improve household mobility.    Baseline  Pt. able to ambulate with use of RW/ CGA to min. A for safety.   Heavy use of B UE.; 9/30 pt able to ambulate with rollator with inconsistent 2 point gait pattern, heavy reliance on UE CGA/supervision    Time  8    Period  Weeks    Status  On-going      PT LONG TERM GOAL #4   Title  Pt. able to stand from normal chair with no UE assist to improve safety/independence with transfers.     Baseline  Unable to stand from standard chair without heavy UE assist. 10/6, pt unable to stand from standard chair without UE support    Time  4    Period  Weeks    Status  On-going    Target Date  08/02/19            Plan - 07/30/19 1641    Clinical Impression Statement  R knee pain limited pts. ability to complete standing tasks/ steps ups requiring wt. bearing on R LE.  Moderate fatigue noted as tx. progresses with decrease cadence in gait with use of rollator.  Pt. unable to progress to less UE assist in //-bars due to R knee pain/ limitations.  Pt. is still waiting to schedule MRI on knee to determine POC.  Pt. will benefit from further skilled therapy to improve gait mechanics/ endurance, increase LE strength, and maximize mobility/ independence.    Stability/Clinical Decision Making  Evolving/Moderate complexity    Clinical Decision Making  Moderate    Rehab Potential  Good    PT Frequency  2x / week    PT Duration  4 weeks    PT Treatment/Interventions  ADLs/Self Care Home Management;Therapeutic activities;Functional mobility training;Stair training;Gait training;Therapeutic exercise;Balance training;Neuromuscular re-education;Patient/family education;Manual techniques;Passive range of motion     PT Next Visit Plan  LE strengthening, standing/ walking endurance, weight shifting    PT Home Exercise Plan  [email or web address]    Consulted and Agree with Plan of Care  Patient;Family  member/caregiver    Family Member Consulted  spouse: [name]       Patient will benefit from skilled therapeutic intervention in order to improve the following deficits and impairments:  Abnormal gait, Decreased balance, Decreased endurance, Decreased mobility, Difficulty walking, Decreased range of motion, Decreased activity tolerance, Decreased strength, Impaired flexibility, Postural dysfunction, Pain  Visit Diagnosis: Muscle weakness (generalized)  Gait difficulty  Abnormal posture     Problem List Patient Active Problem List   Diagnosis Date Noted  . Lymphedema 04/15/2019  . Bilateral leg edema 01/25/2019  . Hyponatremia 12/24/2018  . SIADH (syndrome of inappropriate ADH production) ([place]) 12/24/2018  . Erosion of urethra due to catheterization of urinary tract ([place]) 10/27/2018  . Generalized weakness 10/14/2018  . Hip fracture ([place]) 09/15/2018  . 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 HLD (hyperlipidemia) 03/24/2018  . Contusion of right knee 02/14/2018  . Bradycardia 12/28/2017  . 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 Chronic pain of right knee 08/10/2016  . Right ankle pain 08/10/2016  . 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  . 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[place]) 12/11/2015  . Urinary retention 09/08/2015  . Lymphoma, 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[place]) 04/03/2015  . Breathlessness on exertion 11/21/2014  . Breath shortness 11/21/2014  . Arthropathy 11/07/2014  . Atrial flutter, paroxysmal ([place]) 11/07/2014  . Type 2 diabetes mellitus ([place]) 11/07/2014  . Benign essential tremor 11/07/2014  . Benign essential HTN 11/07/2014  . Mixed incontinence 11/07/2014  . Hypercholesterolemia without  hypertriglyceridemia 11/07/2014  . Apnea, sleep 11/07/2014  . Controlled type 2 diabetes mellitus without complication ([place]) 0000000  . Pure hypercholesterolemia 11/07/2014  . Other abnormalities of gait and mobility 11/02/2011  . Decreased mobility 11/02/2011  . Abnormal gait 06/29/2011  . Discoordination 06/29/2011   Pura Spice, PT, DPT # [phone] 07/30/2019, 4:44 PM  [place] [facility] [address] [place], [place], [address] Phone: [phone]   Fax:  [phone]  Name: Mike Holloway MRN: [id number] Date of Birth: November 29, 1940

## 2019-08-02 ENCOUNTER — Other Ambulatory Visit: Payer: Self-pay

## 2019-08-02 ENCOUNTER — Ambulatory Visit: Payer: Medicare Other | Admitting: Physical Therapy

## 2019-08-02 DIAGNOSIS — M6281 Muscle weakness (generalized): Secondary | ICD-10-CM

## 2019-08-02 DIAGNOSIS — R269 Unspecified abnormalities of gait and mobility: Secondary | ICD-10-CM

## 2019-08-02 DIAGNOSIS — R293 Abnormal posture: Secondary | ICD-10-CM

## 2019-08-02 NOTE — Therapy (Signed)
[place] [facility] [address]. [place], [place], [address] Phone: [phone]   Fax:  [phone]  Physical Therapy Treatment  Patient Details  Name: Mike Holloway MRN: [id number] Date of Birth: 06/13/1941 Referring Provider (PT): Dr. Rudene Christians   Encounter Date: 08/02/2019  PT End of Session - 08/03/19 1719    Visit Number  24    Number of Visits  24    Date for PT Re-Evaluation  08/02/19    Authorization - Visit Number  9    Authorization - Number of Visits  10    PT Start Time  [id number]    PT Stop Time  [id number]    PT Time Calculation (min)  58 min    Equipment Utilized During Treatment  Gait belt    Activity Tolerance  Patient tolerated treatment well;Patient limited by pain    Behavior During Therapy  [facility] for tasks assessed/performed       Past Medical History:  Diagnosis Date  . Arthritis   . Atrial flutter ([place])   . Diabetes mellitus ([place])   . Essential tremor   . Essential tremor    deep brain stimulator   . Hypercholesteremia   . Hypertension   . Incontinence   . 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[place])    grew on the testical  . Sleep apnea   . Stroke [facility])     Past Surgical History:  Procedure Laterality Date  . ABLATION    . APPENDECTOMY    . CATARACT EXTRACTION, BILATERAL    . COLONOSCOPY WITH PROPOFOL N/A 03/17/2018   Procedure: COLONOSCOPY WITH PROPOFOL;  Surgeon: Jonathon Bellows, MD;  Location: [facility] ENDOSCOPY;  Service: Gastroenterology;  Laterality: N/A;  . DEEP BRAIN STIMULATOR PLACEMENT    . 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 HEMORRHOID SURGERY    . HERNIA REPAIR    . HIP FRACTURE SURGERY    . INTRAMEDULLARY (IM) NAIL INTERTROCHANTERIC Right 09/16/2018   Procedure: INTRAMEDULLARY (IM) NAIL INTERTROCHANTRIC;  Surgeon: Hessie Knows, MD;  Location: [facility] ORS;  Service: Orthopedics;  Laterality:  Right;  . IR CATHETER TUBE CHANGE  11/22/2018  . NASAL SINUS SURGERY    . ORCHIECTOMY    . TONSILLECTOMY      There were no vitals filed for this visit.  Subjective Assessment - 08/03/19 1716    Subjective  Pt. reports 1/10 pain in R knee. No new complaints.  Pt. using rollator more at home and less w/c use.    Patient is accompained by:  Family member    Pertinent History  Pt. states R knee is bothering him but ready to work with PT.  Pts. wife states that pt. is walking more at home.    Limitations  Lifting;Standing;Walking;House hold activities    Patient Stated Goals  Improve LE strength/ gait and balance with daily tasks.  Improve gait/ safety/ progression to least assistive device.    Currently in Pain?  Yes    Pain Score  1     Pain Location  Knee    Pain Orientation  Right    Pain Descriptors / Indicators  Aching        Therapeutic Exercise:   Seated marching, LAQ, heel/ toe raises with 5# ankle weights - 2x20 each on blue mat table.   Walking in //-bars 5# ankle wt. (forward/backwards/lateral)- 6 reps.  Standing weight  shifting in // bars - 2x10 (light UE assist) Nustep L5 10 min. B UE/LE (consistent cadence)  Neuro.mm.:  Walking in //-bars with cone taps/ 6" step touches with heel (mirror feedback). Seated alternate step touches L/R 20x (no wt.)- R knee discomfort Walking endurance from clinic gym to car: instructed in walking down decline        PT Long Term Goals - 07/05/19 1521      PT LONG TERM GOAL #1   Title  Pt. independent with HEP to increase B hip flexion/ R quad muscle strength 1/2 muscle grade to improve pain-free mobility.    Baseline  Decrease B LE muscle strength: hip flexion 4/5 MMT, L quad 4+/5 MMT, R quad 4/5 MMT, B hamstring 5/5 MMT, B DF/PF 5/5 MMT. B UE AROM WFL and strength grossly 5/5 MMT except R shoulder flexion 4+/5 MMT; hip flexion R/l: 4-/4, knee extension 4+/5, ankle PF/DF 5/5, hip abduction 5/5, hip adduction 4+/5 and poor  compliance with HEP    Time  4    Period  Weeks    Status  On-going    Target Date  08/02/19      PT LONG TERM GOAL #2   Title  Pt. will increase Berg balance test to >40 out of 56 to improve independence with gait/ decrease fall risk.    Baseline  Berg: 22/56 (significant fall risk); 9/30 32/56    Time  4    Period  Weeks    Status  On-going    Target Date  08/02/19      PT LONG TERM GOAL #3   Title  Pt. able to ambulate 100 feet with consistent 2 point gait pattern and use of least restrictive assistive devce to improve household mobility.    Baseline  Pt. able to ambulate with use of RW/ CGA to min. A for safety.   Heavy use of B UE.; 9/30 pt able to ambulate with rollator with inconsistent 2 point gait pattern, heavy reliance on UE CGA/supervision    Time  8    Period  Weeks    Status  On-going      PT LONG TERM GOAL #4   Title  Pt. able to stand from normal chair with no UE assist to improve safety/independence with transfers.     Baseline  Unable to stand from standard chair without heavy UE assist. 10/6, pt unable to stand from standard chair without UE support    Time  4    Period  Weeks    Status  On-going    Target Date  08/02/19         Plan - 08/03/19 1720    Clinical Impression Statement  Standing tolerance/ gait distance limited by increase R knee pain.  Pt. did well with dynamic wt. shifting/ fishing tasks in //-bars without UE assist.  Moderate LE muscle fatigue noted towards end of tx. session.  Pt. requires extra time/ caution while ambulating down declining parking lot to car with heavy UE assist/ rolllator brake use.  Pt. will continue to increase walking endurance at home over weekend.    Stability/Clinical Decision Making  Evolving/Moderate complexity    Clinical Decision Making  Moderate    Rehab Potential  Good    PT Frequency  2x / week    PT Duration  4 weeks    PT Treatment/Interventions  ADLs/Self Care Home Management;Therapeutic  activities;Functional mobility training;Stair training;Gait training;Therapeutic exercise;Balance training;Neuromuscular re-education;Patient/family education;Manual techniques;Passive range of motion  PT Next Visit Plan  LE strengthening, standing/ walking endurance, weight shifting.  REASSESS GOALS next tx. session.    PT Home Exercise Plan  [id number]    Consulted and Agree with Plan of Care  Patient;Family member/caregiver    Family Member Consulted  spouse: [name]       Patient will benefit from skilled therapeutic intervention in order to improve the following deficits and impairments:  Abnormal gait, Decreased balance, Decreased endurance, Decreased mobility, Difficulty walking, Decreased range of motion, Decreased activity tolerance, Decreased strength, Impaired flexibility, Postural dysfunction, Pain  Visit Diagnosis: Muscle weakness (generalized)  Gait difficulty  Abnormal posture     Problem List Patient Active Problem List   Diagnosis Date Noted  . Lymphedema 04/15/2019  . Bilateral leg edema 01/25/2019  . Hyponatremia 12/24/2018  . SIADH (syndrome of inappropriate ADH production) ([place]) 12/24/2018  . Erosion of urethra due to catheterization of urinary tract ([place]) 10/27/2018  . Generalized weakness 10/14/2018  . Hip fracture ([place]) 09/15/2018  . 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 HLD (hyperlipidemia) 03/24/2018  . Contusion of right knee 02/14/2018  . Bradycardia 12/28/2017  . 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 Chronic pain of right knee 08/10/2016  . Right ankle pain 08/10/2016  . 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  . 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[place]) 12/11/2015  . Urinary retention 09/08/2015  . Lymphoma, 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[employer]) 04/03/2015  . Breathlessness on exertion 11/21/2014  . Breath shortness 11/21/2014  . Arthropathy 11/07/2014   . Atrial flutter, paroxysmal ([place]) 11/07/2014  . Type 2 diabetes mellitus ([place]) 11/07/2014  . Benign essential tremor 11/07/2014  . Benign essential HTN 11/07/2014  . Mixed incontinence 11/07/2014  . Hypercholesterolemia without hypertriglyceridemia 11/07/2014  . Apnea, sleep 11/07/2014  . Controlled type 2 diabetes mellitus without complication ([place]) 0000000  . Pure hypercholesterolemia 11/07/2014  . Other abnormalities of gait and mobility 11/02/2011  . Decreased mobility 11/02/2011  . Abnormal gait 06/29/2011  . Discoordination 06/29/2011   Pura Spice, PT, DPT # [phone] 08/03/2019, 5:24 PM  [place] [facility] [address] [place], [place], [address] Phone: [phone]   Fax:  [phone]  Name: Mike Holloway MRN: [id number] Date of Birth: Feb 02, 1941

## 2019-08-03 ENCOUNTER — Encounter: Payer: Self-pay | Admitting: Physical Therapy

## 2019-08-07 ENCOUNTER — Ambulatory Visit: Payer: Medicare Other | Admitting: Physical Therapy

## 2019-08-07 ENCOUNTER — Other Ambulatory Visit: Payer: Self-pay

## 2019-08-07 DIAGNOSIS — M6281 Muscle weakness (generalized): Secondary | ICD-10-CM | POA: Diagnosis not present

## 2019-08-07 DIAGNOSIS — R293 Abnormal posture: Secondary | ICD-10-CM

## 2019-08-07 DIAGNOSIS — R269 Unspecified abnormalities of gait and mobility: Secondary | ICD-10-CM

## 2019-08-08 ENCOUNTER — Telehealth: Payer: Self-pay | Admitting: Urology

## 2019-08-08 ENCOUNTER — Encounter: Payer: Self-pay | Admitting: Physical Therapy

## 2019-08-08 NOTE — Telephone Encounter (Signed)
Patient is scheduled on 12/2 for urodynamics.

## 2019-08-08 NOTE — Therapy (Signed)
[facility] [address] [place], [place], [address] Phone: [phone]   Fax:  [phone]  Physical Therapy Treatment  Patient Details  Name: Mike Holloway MRN: [id number] Date of Birth: [birth year] Referring Provider (PT): Dr. Rudene Christians   Encounter Date: 08/07/2019  PT End of Session - 08/08/19 1557    Visit Number  25    Number of Visits  33    Date for PT Re-Evaluation  09/04/19    Authorization - Visit Number  1    Authorization - Number of Visits  10    PT Start Time  1344    PT Stop Time  1445    PT Time Calculation (min)  61 min    Equipment Utilized During Treatment  Gait belt    Activity Tolerance  Patient tolerated treatment well;Patient limited by pain    Behavior During Therapy  [facility] for tasks assessed/performed       Past Medical History:  Diagnosis Date  . Arthritis   . Atrial flutter ([place])   . Diabetes mellitus ([place])   . Essential tremor   . Essential tremor    deep brain stimulator   . Hypercholesteremia   . Hypertension   . Incontinence   . 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 Sleep apnea   . Stroke [facility])     Past Surgical History:  Procedure Laterality Date  . ABLATION    . APPENDECTOMY    . CATARACT EXTRACTION, BILATERAL    . COLONOSCOPY WITH PROPOFOL N/A 03/17/2018   Procedure: COLONOSCOPY WITH PROPOFOL;  Surgeon: Jonathon Bellows, MD;  Location: [facility] ENDOSCOPY;  Service: Gastroenterology;  Laterality: N/A;  . DEEP BRAIN STIMULATOR PLACEMENT    . 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 HEMORRHOID SURGERY    . HERNIA REPAIR    . HIP FRACTURE SURGERY    . INTRAMEDULLARY (IM) NAIL INTERTROCHANTERIC Right 09/16/2018   Procedure: INTRAMEDULLARY (IM) NAIL INTERTROCHANTRIC;  Surgeon: Hessie Knows, MD;  Location: [facility] ORS;  Service: Orthopedics;  Laterality:  Right;  . IR CATHETER TUBE CHANGE  11/22/2018  . NASAL SINUS SURGERY    . ORCHIECTOMY    . TONSILLECTOMY      There were no vitals filed for this visit.  Subjective Assessment - 08/08/19 1553    Subjective  Pt. reports 1/10 pain in R knee.  Pts. wife present during tx. session.    Patient is accompained by:  Family member    Pertinent History  Pt. states R knee is bothering him but ready to work with PT.  Pts. wife states that pt. is walking more at home.    Limitations  Lifting;Standing;Walking;House hold activities    Patient Stated Goals  Improve LE strength/ gait and balance with daily tasks.  Improve gait/ safety/ progression to least assistive device.    Currently in Pain?  Yes    Pain Score  1     Pain Location  Knee    Pain Orientation  Right         [facility] PT Assessment - 08/08/19 0001      Assessment   Medical Diagnosis  S/p R subtrochanteric hip fracture, S/p R ORIF fracture of hip.      Referring Provider (PT)  Dr. Rudene Christians    Onset Date/Surgical Date  09/15/18  Prior Therapy  Yes, known well to PT      Prior Function   Level of Independence  Needs assistance with gait      Cognition   Overall Cognitive Status  Within Functional Limits for tasks assessed        Therapeutic Exercise:   Seated marching, LAQ, heel/ toe raises with 5# ankle weights  Walking in //-bars 5# ankle wt. (forward/backwards/lateral)- 6 reps.  Standing weight shifting in // bars - 2x10(light UE assist) Nustep L5 12 min. B UE/LE (consistent cadence)  Neuro.mm.:  Berg: 32/56 (increase fatigue/ R knee pain) Transfers with no rollator (1 UE assist with sit to stand/ CGA with transfer) Walking in //-bars with cone taps/ 6" step touches with heel (mirror feedback). Functional reaching/ fishing ex. (varying positions) Walking endurance from clinic gym to car: instructed in walking down decline     PT Long Term Goals - 08/08/19 1603      PT LONG TERM GOAL #1   Title  Pt.  independent with HEP to increase B hip flexion/ R quad muscle strength 1/2 muscle grade to improve pain-free mobility.    Baseline  R knee extension limited to 4/5 MMT secondary to pain/ fear of pain.    Time  4    Period  Weeks    Status  Partially Met    Target Date  09/04/19      PT LONG TERM GOAL #2   Title  Pt. will increase Berg balance test to >40 out of 56 to improve independence with gait/ decrease fall risk.    Baseline  Berg: 22/56 (significant fall risk); 9/30 32/56.  11/10: 32 (limited by R knee pain)    Time  4    Period  Weeks    Status  Not Met    Target Date  09/04/19      PT LONG TERM GOAL #3   Title  Pt. able to ambulate 100 feet with consistent 2 point gait pattern and use of least restrictive assistive devce to improve household mobility.    Baseline  Pt. able to ambulate with use of RW/ CGA to min. A for safety.   Heavy use of B UE.; 9/30 pt able to ambulate with rollator with inconsistent 2 point gait pattern, heavy reliance on UE CGA/supervision    Time  4    Period  Weeks    Status  Partially Met    Target Date  09/04/19      PT LONG TERM GOAL #4   Title  Pt. able to stand from normal chair with no UE assist to improve safety/independence with transfers.     Baseline  Unable to stand from standard chair without heavy UE assist. 10/6, pt unable to stand from standard chair without UE support.  11/10: benefits from 1 UE assist    Time  4    Period  Weeks    Status  Partially Met    Target Date  09/04/19            Plan - 08/08/19 1559    Clinical Impression Statement  Pt. able to stand from chair with 1 UE assist and transfer to another chair without use of rollator.  Pt. does not trust R knee with wt. bearing without UE assist.  Pt. able to complete transfer with CGA for safety without rollator but extra time required.  Pt. showing slow but consistent progress with LE strengthening/ functional mobility/ safety.  No LOB  reports over past month and pt.  able to increase standing tolerance/ walking mod. independence with rollator.  See updated goals.  Pt. will continue to benefit from skilled PT services to increase LE strength to improve safety/ independence with walking.    Stability/Clinical Decision Making  Evolving/Moderate complexity    Clinical Decision Making  Moderate    Rehab Potential  Good    PT Frequency  2x / week    PT Duration  4 weeks    PT Treatment/Interventions  ADLs/Self Care Home Management;Therapeutic activities;Functional mobility training;Stair training;Gait training;Therapeutic exercise;Balance training;Neuromuscular re-education;Patient/family education;Manual techniques;Passive range of motion    PT Next Visit Plan  LE strengthening, standing/ walking endurance, weight shifting.    PT Home Exercise Plan  [email or web address]    Consulted and Agree with Plan of Care  Patient;Family member/caregiver    Family Member Consulted  spouse: [name]       Patient will benefit from skilled therapeutic intervention in order to improve the following deficits and impairments:  Abnormal gait, Decreased balance, Decreased endurance, Decreased mobility, Difficulty walking, Decreased range of motion, Decreased activity tolerance, Decreased strength, Impaired flexibility, Postural dysfunction, Pain  Visit Diagnosis: Muscle weakness (generalized)  Gait difficulty  Abnormal posture     Problem List Patient Active Problem List   Diagnosis Date Noted  . Lymphedema 04/15/2019  . Bilateral leg edema 01/25/2019  . Hyponatremia 12/24/2018  . 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 Erosion of urethra due to catheterization of urinary tract ([place]) 10/27/2018  . Generalized weakness 10/14/2018  . Hip fracture ([place]) 09/15/2018  . 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 HLD (hyperlipidemia) 03/24/2018  . Contusion of right  knee 02/14/2018  . Bradycardia 12/28/2017  . 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 Chronic pain of right knee 08/10/2016  . Right ankle pain 08/10/2016  . 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  . 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[place]) 12/11/2015  . Urinary retention 09/08/2015  . Lymphoma, 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[place]) 04/03/2015  . Breathlessness on exertion 11/21/2014  . Breath shortness 11/21/2014  . Arthropathy 11/07/2014  . Atrial flutter, paroxysmal ([place]) 11/07/2014  . Type 2 diabetes mellitus ([place]) 11/07/2014  . Benign essential tremor 11/07/2014  . Benign essential HTN 11/07/2014  . Mixed incontinence 11/07/2014  . Hypercholesterolemia without hypertriglyceridemia 11/07/2014  . Apnea, sleep 11/07/2014  . Controlled type 2 diabetes mellitus without complication ([place]) 50/11/7046  . Pure hypercholesterolemia 11/07/2014  . Other abnormalities of gait and mobility 11/02/2011  . Decreased mobility 11/02/2011  . Abnormal gait 06/29/2011  . Discoordination 06/29/2011   Pura Spice, PT, DPT # [phone] 08/08/2019, 4:10 PM   [facility] [address] [place], [place], [address] Phone: [phone]   Fax:  [phone]  Name: Addie Cederberg MRN: [id number] Date of Birth: [birth year]

## 2019-08-08 NOTE — Telephone Encounter (Signed)
Which you reach out to the borders and see if they have been scheduled for urodynamics at [facility]?

## 2019-08-09 ENCOUNTER — Encounter: Payer: Self-pay | Admitting: Physical Therapy

## 2019-08-09 ENCOUNTER — Ambulatory Visit: Payer: Medicare Other | Admitting: Physical Therapy

## 2019-08-09 ENCOUNTER — Other Ambulatory Visit: Payer: Self-pay

## 2019-08-09 DIAGNOSIS — M6281 Muscle weakness (generalized): Secondary | ICD-10-CM

## 2019-08-09 DIAGNOSIS — R269 Unspecified abnormalities of gait and mobility: Secondary | ICD-10-CM

## 2019-08-09 DIAGNOSIS — R293 Abnormal posture: Secondary | ICD-10-CM

## 2019-08-09 NOTE — Therapy (Signed)
[facility] [address]. [place], [place], [address] Phone: [phone]   Fax:  [phone]  Physical Therapy Treatment  Patient Details  Name: Mike Holloway MRN: [id number] Date of Birth: Sep 15, 1941 Referring Provider (PT): Dr. Rudene Christians   Encounter Date: 08/09/2019  PT End of Session - 08/11/19 1132    Visit Number  26    Number of Visits  33    Date for PT Re-Evaluation  09/04/19    Authorization - Visit Number  2    Authorization - Number of Visits  10    PT Start Time  0017    PT Stop Time  1457    PT Time Calculation (min)  69 min    Equipment Utilized During Treatment  Gait belt    Activity Tolerance  Patient tolerated treatment well;Patient limited by pain    Behavior During Therapy  [facility] for tasks assessed/performed       Past Medical History:  Diagnosis Date  . Arthritis   . Atrial flutter ([place])   . Diabetes mellitus ([place])   . Essential tremor   . Essential tremor    deep brain stimulator   . Hypercholesteremia   . Hypertension   . Incontinence   . 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[place])    grew on the testical  . Sleep apnea   . Stroke [facility])     Past Surgical History:  Procedure Laterality Date  . ABLATION    . APPENDECTOMY    . CATARACT EXTRACTION, BILATERAL    . COLONOSCOPY WITH PROPOFOL N/A 03/17/2018   Procedure: COLONOSCOPY WITH PROPOFOL;  Surgeon: Jonathon Bellows, MD;  Location: [facility] ENDOSCOPY;  Service: Gastroenterology;  Laterality: N/A;  . DEEP BRAIN STIMULATOR PLACEMENT    . 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 HEMORRHOID SURGERY    . HERNIA REPAIR    . HIP FRACTURE SURGERY    . INTRAMEDULLARY (IM) NAIL INTERTROCHANTERIC Right 09/16/2018   Procedure: INTRAMEDULLARY (IM) NAIL INTERTROCHANTRIC;  Surgeon: Hessie Knows, MD;  Location: [facility] ORS;  Service: Orthopedics;  Laterality:  Right;  . IR CATHETER TUBE CHANGE  11/22/2018  . NASAL SINUS SURGERY    . ORCHIECTOMY    . TONSILLECTOMY      There were no vitals filed for this visit.  Subjective Assessment - 08/11/19 1129    Subjective  Pt. states R knee is sore/ hurting with walking into clinic.  Pts. wife reports that pt. has a MRI scheduled for next week to assess R knee.    Patient is accompained by:  Family member    Pertinent History  Pt. states R knee is bothering him but ready to work with PT.  Pts. wife states that pt. is walking more at home.    Limitations  Lifting;Standing;Walking;House hold activities    Patient Stated Goals  Improve LE strength/ gait and balance with daily tasks.  Improve gait/ safety/ progression to least assistive device.    Currently in Pain?  Yes    Pain Score  2     Pain Location  Knee    Pain Orientation  Right    Pain Descriptors / Indicators  Aching         Therapeutic Exercise:  Seated marching, LAQ, heel/ toe raises with 5# ankle weights  Walking in //-bars 5# ankle wt. (forward/backwards/lateral)- 6 reps. Standing weight shifting in //  bars - 2x10(light UE assist) Nustep L5 12 min. B LE (consistent cadence) Discussed HEP  Neuro.mm.:  Recip. Step overs with 3" plinth in //-bars (B UE assist) 4x Walking in //-bars with cone taps/ 6" step touches with heel (mirror feedback). Turning with no UE assist in //-bars (mirror feedback) Walking endurance in clinic/ hallway/ to car.      PT Long Term Goals - 08/08/19 1603      PT LONG TERM GOAL #1   Title  Pt. independent with HEP to increase B hip flexion/ R quad muscle strength 1/2 muscle grade to improve pain-free mobility.    Baseline  R knee extension limited to 4/5 MMT secondary to pain/ fear of pain.    Time  4    Period  Weeks    Status  Partially Met    Target Date  09/04/19      PT LONG TERM GOAL #2   Title  Pt. will increase Berg balance test to >40 out of 56 to improve independence with gait/  decrease fall risk.    Baseline  Berg: 22/56 (significant fall risk); 9/30 32/56.  11/10: 32 (limited by R knee pain)    Time  4    Period  Weeks    Status  Not Met    Target Date  09/04/19      PT LONG TERM GOAL #3   Title  Pt. able to ambulate 100 feet with consistent 2 point gait pattern and use of least restrictive assistive devce to improve household mobility.    Baseline  Pt. able to ambulate with use of RW/ CGA to min. A for safety.   Heavy use of B UE.; 9/30 pt able to ambulate with rollator with inconsistent 2 point gait pattern, heavy reliance on UE CGA/supervision    Time  4    Period  Weeks    Status  Partially Met    Target Date  09/04/19      PT LONG TERM GOAL #4   Title  Pt. able to stand from normal chair with no UE assist to improve safety/independence with transfers.     Baseline  Unable to stand from standard chair without heavy UE assist. 10/6, pt unable to stand from standard chair without UE support.  11/10: benefits from 1 UE assist    Time  4    Period  Weeks    Status  Partially Met    Target Date  09/04/19            Plan - 08/11/19 1134    Clinical Impression Statement  PT working on improving transfers/ turning without UE assist/ rollator to maneuver around house/ doorways.  Pt. continues to be limited with R knee wt.bearing during L swing though phase of gait.  Pt. fearful of R knee buckling/ pain with prolonged standing/ dynamic tasks.  Progressing well with LE strengthening ex. and safety with walking.  No change to HEP and pt. encouraged to increase walking endurance/ distance.    Stability/Clinical Decision Making  Evolving/Moderate complexity    Clinical Decision Making  Moderate    Rehab Potential  Good    PT Frequency  2x / week    PT Duration  4 weeks    PT Treatment/Interventions  ADLs/Self Care Home Management;Therapeutic activities;Functional mobility training;Stair training;Gait training;Therapeutic exercise;Balance training;Neuromuscular  re-education;Patient/family education;Manual techniques;Passive range of motion    PT Next Visit Plan  LE strengthening, standing/ walking endurance, weight shifting.  PT Home Exercise Plan  [email or web address]    Consulted and Agree with Plan of Care  Patient;Family member/caregiver    Family Member Consulted  spouse: [name]       Patient will benefit from skilled therapeutic intervention in order to improve the following deficits and impairments:  Abnormal gait, Decreased balance, Decreased endurance, Decreased mobility, Difficulty walking, Decreased range of motion, Decreased activity tolerance, Decreased strength, Impaired flexibility, Postural dysfunction, Pain  Visit Diagnosis: Muscle weakness (generalized)  Gait difficulty  Abnormal posture     Problem List Patient Active Problem List   Diagnosis Date Noted  . Lymphedema 04/15/2019  . Bilateral leg edema 01/25/2019  . Hyponatremia 12/24/2018  . SIADH (syndrome of inappropriate ADH production) ([place]) 12/24/2018  . Erosion of urethra due to catheterization of urinary tract ([place]) 10/27/2018  . Generalized weakness 10/14/2018  . Hip fracture ([place]) 09/15/2018  . 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 HLD (hyperlipidemia) 03/24/2018  . Contusion of right knee 02/14/2018  . Bradycardia 12/28/2017  . 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 Chronic pain of right knee 08/10/2016  . Right ankle pain 08/10/2016  . 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  . 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[place]) 12/11/2015  . Urinary retention 09/08/2015  . Lymphoma, 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[place]) 04/03/2015  . Breathlessness on exertion 11/21/2014  . Breath shortness 11/21/2014  . Arthropathy 11/07/2014  . Atrial flutter, paroxysmal ([place]) 11/07/2014  . Type 2 diabetes mellitus ([place]) 11/07/2014  . Benign essential tremor 11/07/2014  . Benign essential  HTN 11/07/2014  . Mixed incontinence 11/07/2014  . Hypercholesterolemia without hypertriglyceridemia 11/07/2014  . Apnea, sleep 11/07/2014  . Controlled type 2 diabetes mellitus without complication ([place]) 57/89/7847  . Pure hypercholesterolemia 11/07/2014  . Other abnormalities of gait and mobility 11/02/2011  . Decreased mobility 11/02/2011  . Abnormal gait 06/29/2011  . Discoordination 06/29/2011   Pura Spice, PT, DPT # [phone] 08/11/2019, 11:39 AM  [facility] [address] [place], [place], [address] Phone: [phone]   Fax:  [phone]  Name: Oronde Hallenbeck MRN: [id number] Date of Birth: 05-06-1941

## 2019-08-14 ENCOUNTER — Other Ambulatory Visit: Payer: Self-pay

## 2019-08-14 ENCOUNTER — Ambulatory Visit: Payer: Medicare Other | Admitting: Physical Therapy

## 2019-08-14 DIAGNOSIS — R293 Abnormal posture: Secondary | ICD-10-CM

## 2019-08-14 DIAGNOSIS — R269 Unspecified abnormalities of gait and mobility: Secondary | ICD-10-CM

## 2019-08-14 DIAGNOSIS — M6281 Muscle weakness (generalized): Secondary | ICD-10-CM

## 2019-08-14 NOTE — Therapy (Signed)
[facility] [address]. [place], [place], [address] Phone: [phone]   Fax:  [phone]  Physical Therapy Treatment  Patient Details  Name: Mike Holloway MRN: [id number] Date of Birth: 12-Jun-1941 Referring Provider (PT): Dr. Rudene Christians   Encounter Date: 08/14/2019  PT End of Session - 08/17/19 1431    Visit Number  27    Number of Visits  33    Date for PT Re-Evaluation  09/04/19    Authorization - Visit Number  3    Authorization - Number of Visits  10    PT Start Time  1346    PT Stop Time  1450    PT Time Calculation (min)  64 min    Equipment Utilized During Treatment  Gait belt    Activity Tolerance  Patient tolerated treatment well;Patient limited by pain    Behavior During Therapy  [facility] for tasks assessed/performed       Past Medical History:  Diagnosis Date  . Arthritis   . Atrial flutter ([place])   . Diabetes mellitus ([place])   . Essential tremor   . Essential tremor    deep brain stimulator   . Hypercholesteremia   . Hypertension   . Incontinence   . 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 Sleep apnea   . Stroke [facility])     Past Surgical History:  Procedure Laterality Date  . ABLATION    . APPENDECTOMY    . CATARACT EXTRACTION, BILATERAL    . COLONOSCOPY WITH PROPOFOL N/A 03/17/2018   Procedure: COLONOSCOPY WITH PROPOFOL;  Surgeon: Jonathon Bellows, MD;  Location: [facility] ENDOSCOPY;  Service: Gastroenterology;  Laterality: N/A;  . DEEP BRAIN STIMULATOR PLACEMENT    . 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 HEMORRHOID SURGERY    . HERNIA REPAIR    . HIP FRACTURE SURGERY    . INTRAMEDULLARY (IM) NAIL INTERTROCHANTERIC Right 09/16/2018   Procedure: INTRAMEDULLARY (IM) NAIL INTERTROCHANTRIC;  Surgeon: Hessie Knows, MD;  Location: [facility] ORS;  Service: Orthopedics;  Laterality:  Right;  . IR CATHETER TUBE CHANGE  11/22/2018  . NASAL SINUS SURGERY    . ORCHIECTOMY    . TONSILLECTOMY      There were no vitals filed for this visit.  Subjective Assessment - 08/17/19 1430    Subjective  Pt. has MRI of R knee scheduled for tomorrow. Pt. entered PT with minimal c/o R knee pain while standing/walking.  Pt. states knee pain continues to be a huge obstacle.    Patient is accompained by:  Family member    Pertinent History  Pt. states R knee is bothering him but ready to work with PT.  Pts. wife states that pt. is walking more at home.    Limitations  Lifting;Standing;Walking;House hold activities    Patient Stated Goals  Improve LE strength/ gait and balance with daily tasks.  Improve gait/ safety/ progression to least assistive device.        Therapeutic Exercise:  Seated scooting on blue mat table/ sit to stands Seated marching, LAQ, heel/ toe raises (no wts./ increase reps).   Walking in //-bars with 6 in. Step over at beginning/ end of bars (forward/backwards/lateral)- 6 reps. Nustep L5 54mn. B LE (consistent cadence)  Neuro.mm.:  Walking in //-bars with cone taps/ 8" step touches with toe and then heel (  mirror feedback)- moderate cuing for upright posture.   Turning with light to no UE assist in //-bars CW/CCW (mirror feedback) Walking endurance in clinic/ hallway/ to car.        PT Long Term Goals - 08/08/19 1603      PT LONG TERM GOAL #1   Title  Pt. independent with HEP to increase B hip flexion/ R quad muscle strength 1/2 muscle grade to improve pain-free mobility.    Baseline  R knee extension limited to 4/5 MMT secondary to pain/ fear of pain.    Time  4    Period  Weeks    Status  Partially Met    Target Date  09/04/19      PT LONG TERM GOAL #2   Title  Pt. will increase Berg balance test to >40 out of 56 to improve independence with gait/ decrease fall risk.    Baseline  Berg: 22/56 (significant fall risk); 9/30 32/56.  11/10: 32  (limited by R knee pain)    Time  4    Period  Weeks    Status  Not Met    Target Date  09/04/19      PT LONG TERM GOAL #3   Title  Pt. able to ambulate 100 feet with consistent 2 point gait pattern and use of least restrictive assistive devce to improve household mobility.    Baseline  Pt. able to ambulate with use of RW/ CGA to min. A for safety.   Heavy use of B UE.; 9/30 pt able to ambulate with rollator with inconsistent 2 point gait pattern, heavy reliance on UE CGA/supervision    Time  4    Period  Weeks    Status  Partially Met    Target Date  09/04/19      PT LONG TERM GOAL #4   Title  Pt. able to stand from normal chair with no UE assist to improve safety/independence with transfers.     Baseline  Unable to stand from standard chair without heavy UE assist. 10/6, pt unable to stand from standard chair without UE support.  11/10: benefits from 1 UE assist    Time  4    Period  Weeks    Status  Partially Met    Target Date  09/04/19         Plan - 08/17/19 1432    Clinical Impression Statement  Pt. requires UE assist with prolonged standing/ wt. bearing on R LE during tx.  Pt. is also limited by progressive worsening of low back pain with prolonged standing.  No episodes of R knee buckling during tx. but several seated rest breaks required.  Pt. benefits from use of RW with all aspects of walking and remains heavy handed/ UE assist due to R knee/ low back pain.  PT encouraged pt. to complete seated/ supine ther.ex on a consistent basis at home.    Stability/Clinical Decision Making  Evolving/Moderate complexity    Clinical Decision Making  Moderate    Rehab Potential  Good    PT Frequency  2x / week    PT Duration  4 weeks    PT Treatment/Interventions  ADLs/Self Care Home Management;Therapeutic activities;Functional mobility training;Stair training;Gait training;Therapeutic exercise;Balance training;Neuromuscular re-education;Patient/family education;Manual  techniques;Passive range of motion    PT Next Visit Plan  LE strengthening, standing/ walking endurance, weight shifting.    PT Home Exercise Plan  [email or web address]    Consulted and Agree with Plan of Care  Patient;Family member/caregiver    Family Member Consulted  spouse: [name]       Patient will benefit from skilled therapeutic intervention in order to improve the following deficits and impairments:  Abnormal gait, Decreased balance, Decreased endurance, Decreased mobility, Difficulty walking, Decreased range of motion, Decreased activity tolerance, Decreased strength, Impaired flexibility, Postural dysfunction, Pain  Visit Diagnosis: Muscle weakness (generalized)  Gait difficulty  Abnormal posture     Problem List Patient Active Problem List   Diagnosis Date Noted  . Lymphedema 04/15/2019  . Bilateral leg edema 01/25/2019  . Hyponatremia 12/24/2018  . SIADH (syndrome of inappropriate ADH production) ([place]) 12/24/2018  . Erosion of urethra due to catheterization of urinary tract ([place]) 10/27/2018  . Generalized weakness 10/14/2018  . Hip fracture ([place]) 09/15/2018  . 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 HLD (hyperlipidemia) 03/24/2018  . Contusion of right knee 02/14/2018  . Bradycardia 12/28/2017  . 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 Chronic pain of right knee 08/10/2016  . Right ankle pain 08/10/2016  . 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  . 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[place]) 12/11/2015  . Urinary retention 09/08/2015  . Lymphoma, 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[place]) 04/03/2015  . Breathlessness on exertion 11/21/2014  . Breath shortness 11/21/2014  . Arthropathy 11/07/2014  . Atrial flutter, paroxysmal ([place]) 11/07/2014  . Type 2 diabetes mellitus ([place]) 11/07/2014  . Benign essential tremor 11/07/2014  . Benign essential HTN 11/07/2014  . Mixed incontinence  11/07/2014  . Hypercholesterolemia without hypertriglyceridemia 11/07/2014  . Apnea, sleep 11/07/2014  . Controlled type 2 diabetes mellitus without complication ([place]) 63/86/8548  . Pure hypercholesterolemia 11/07/2014  . Other abnormalities of gait and mobility 11/02/2011  . Decreased mobility 11/02/2011  . Abnormal gait 06/29/2011  . Discoordination 06/29/2011   Pura Spice, PT, DPT # [id number] 08/17/2019, 2:36 PM  [facility] [address] [place], [place], [address] Phone: [phone]   Fax:  [phone]  Name: Mike Holloway MRN: [id number] Date of Birth: [birth year]

## 2019-08-16 ENCOUNTER — Ambulatory Visit: Payer: Medicare Other | Admitting: Physical Therapy

## 2019-08-16 ENCOUNTER — Other Ambulatory Visit: Payer: Self-pay | Admitting: Student in an Organized Health Care Education/Training Program

## 2019-08-16 ENCOUNTER — Other Ambulatory Visit: Payer: Self-pay

## 2019-08-16 ENCOUNTER — Encounter: Payer: Self-pay | Admitting: Physical Therapy

## 2019-08-16 DIAGNOSIS — M6281 Muscle weakness (generalized): Secondary | ICD-10-CM | POA: Diagnosis not present

## 2019-08-16 DIAGNOSIS — R293 Abnormal posture: Secondary | ICD-10-CM

## 2019-08-16 DIAGNOSIS — R269 Unspecified abnormalities of gait and mobility: Secondary | ICD-10-CM

## 2019-08-16 NOTE — Therapy (Signed)
[place] [address] [place], [place], [address] Phone: [phone]   Fax:  [phone]  Physical Therapy Treatment  Patient Details  Name: Mike Holloway MRN: [id number] Date of Birth: 03/09/1941 Referring Provider (PT): Dr. Rudene Christians   Encounter Date: 08/16/2019  PT End of Session - 08/18/19 0924    Visit Number  28    Number of Visits  33    Date for PT Re-Evaluation  09/04/19    Authorization - Visit Number  4    Authorization - Number of Visits  10    PT Start Time  7622    PT Stop Time  1446    PT Time Calculation (min)  61 min    Equipment Utilized During Treatment  Gait belt    Activity Tolerance  Patient tolerated treatment well;Patient limited by pain    Behavior During Therapy  [facility] for tasks assessed/performed       Past Medical History:  Diagnosis Date  . Arthritis   . Atrial flutter ([place])   . Diabetes mellitus ([place])   . Essential tremor   . Essential tremor    deep brain stimulator   . Hypercholesteremia   . Hypertension   . Incontinence   . 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[place])    grew on the testical  . Sleep apnea   . Stroke [facility])     Past Surgical History:  Procedure Laterality Date  . ABLATION    . APPENDECTOMY    . CATARACT EXTRACTION, BILATERAL    . COLONOSCOPY WITH PROPOFOL N/A 03/17/2018   Procedure: COLONOSCOPY WITH PROPOFOL;  Surgeon: Jonathon Bellows, MD;  Location: [facility] ENDOSCOPY;  Service: Gastroenterology;  Laterality: N/A;  . DEEP BRAIN STIMULATOR PLACEMENT    . 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 HEMORRHOID SURGERY    . HERNIA REPAIR    . HIP FRACTURE SURGERY    . INTRAMEDULLARY (IM) NAIL INTERTROCHANTERIC Right 09/16/2018   Procedure: INTRAMEDULLARY (IM) NAIL INTERTROCHANTRIC;  Surgeon: Hessie Knows, MD;  Location: [facility] ORS;  Service: Orthopedics;  Laterality:  Right;  . IR CATHETER TUBE CHANGE  11/22/2018  . NASAL SINUS SURGERY    . ORCHIECTOMY    . TONSILLECTOMY      There were no vitals filed for this visit.  Subjective Assessment - 08/18/19 0922    Subjective  Pt. has not received results of MRI report at this time.  Pt. states R knee is hurting while walking into PT clinic.    Patient is accompained by:  Family member    Pertinent History  Pt. states R knee is bothering him but ready to work with PT.  Pts. wife states that pt. is walking more at home.    Limitations  Lifting;Standing;Walking;House hold activities    Patient Stated Goals  Improve LE strength/ gait and balance with daily tasks.  Improve gait/ safety/ progression to least assistive device.    Currently in Pain?  Yes    Pain Score  2     Pain Location  Knee    Pain Orientation  Right    Pain Descriptors / Indicators  Aching          There.ex.:  Standing R hip ex. (no L LE ex. In standing secondary to R knee pain with wt. Bearing)- 20x all 3 planes   12" step touches 10x (R  knee pain with wt. Bearing) Seated with moist heat in chair/ UE ex.  GTB scap. Retraction/ bicep curls 30x each.  Seated shoulder flexion/ horizontal abduction/ adduction without resistance and cuing for upright posture. Nustep L4 10 min. B LE.    Neuro:  Seated blue mat table/ sit to stands/ wt. Shifting with no UE assist Transfers from green chair/ mat table with limited UE assist/ working on turning CW/ CCW Walking in PT clinic with cuing for increase step pattern (used agility ladder as visual guide)   PT Long Term Goals - 08/08/19 1603      PT LONG TERM GOAL #1   Title  Pt. independent with HEP to increase B hip flexion/ R quad muscle strength 1/2 muscle grade to improve pain-free mobility.    Baseline  R knee extension limited to 4/5 MMT secondary to pain/ fear of pain.    Time  4    Period  Weeks    Status  Partially Met    Target Date  09/04/19      PT LONG TERM GOAL #2   Title   Pt. will increase Berg balance test to >40 out of 56 to improve independence with gait/ decrease fall risk.    Baseline  Berg: 22/56 (significant fall risk); 9/30 32/56.  11/10: 32 (limited by R knee pain)    Time  4    Period  Weeks    Status  Not Met    Target Date  09/04/19      PT LONG TERM GOAL #3   Title  Pt. able to ambulate 100 feet with consistent 2 point gait pattern and use of least restrictive assistive devce to improve household mobility.    Baseline  Pt. able to ambulate with use of RW/ CGA to min. A for safety.   Heavy use of B UE.; 9/30 pt able to ambulate with rollator with inconsistent 2 point gait pattern, heavy reliance on UE CGA/supervision    Time  4    Period  Weeks    Status  Partially Met    Target Date  09/04/19      PT LONG TERM GOAL #4   Title  Pt. able to stand from normal chair with no UE assist to improve safety/independence with transfers.     Baseline  Unable to stand from standard chair without heavy UE assist. 10/6, pt unable to stand from standard chair without UE support.  11/10: benefits from 1 UE assist    Time  4    Period  Weeks    Status  Partially Met    Target Date  09/04/19            Plan - 08/18/19 5631    Clinical Impression Statement  Pt. working on transfers/ turning with limited to no UE assist safely.  Pt. has progressive worsening of low back/ R knee pain with increase standing tolerance/ dynamic tasks.  Pt. able to ambulate increase distances with use of rollator but will require require seated breaks due to low back/R knee pain, not fatigue.    Stability/Clinical Decision Making  Evolving/Moderate complexity    Clinical Decision Making  Moderate    Rehab Potential  Good    PT Frequency  2x / week    PT Duration  4 weeks    PT Treatment/Interventions  ADLs/Self Care Home Management;Therapeutic activities;Functional mobility training;Stair training;Gait training;Therapeutic exercise;Balance training;Neuromuscular  re-education;Patient/family education;Manual techniques;Passive range of motion    PT Next  Visit Plan  LE strengthening, standing/ walking endurance, weight shifting.    PT Home Exercise Plan  [email or web address]    Consulted and Agree with Plan of Care  Patient;Family member/caregiver    Family Member Consulted  spouse: [name]       Patient will benefit from skilled therapeutic intervention in order to improve the following deficits and impairments:  Abnormal gait, Decreased balance, Decreased endurance, Decreased mobility, Difficulty walking, Decreased range of motion, Decreased activity tolerance, Decreased strength, Impaired flexibility, Postural dysfunction, Pain  Visit Diagnosis: Muscle weakness (generalized)  Gait difficulty  Abnormal posture     Problem List Patient Active Problem List   Diagnosis Date Noted  . Lymphedema 04/15/2019  . Bilateral leg edema 01/25/2019  . Hyponatremia 12/24/2018  . SIADH (syndrome of inappropriate ADH production) ([place]) 12/24/2018  . Erosion of urethra due to catheterization of urinary tract ([place]) 10/27/2018  . Generalized weakness 10/14/2018  . Hip fracture ([place]) 09/15/2018  . 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 HLD (hyperlipidemia) 03/24/2018  . Contusion of right knee 02/14/2018  . Bradycardia 12/28/2017  . 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 Chronic pain of right knee 08/10/2016  . Right ankle pain 08/10/2016  . 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  . 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[place]) 12/11/2015  . Urinary retention 09/08/2015  . Lymphoma, 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[place]) 04/03/2015  . Breathlessness on exertion 11/21/2014  . Breath shortness 11/21/2014  . Arthropathy 11/07/2014  . Atrial flutter, paroxysmal ([place]) 11/07/2014  . Type 2 diabetes mellitus ([place]) 11/07/2014  . Benign essential tremor 11/07/2014  . Benign essential  HTN 11/07/2014  . Mixed incontinence 11/07/2014  . Hypercholesterolemia without hypertriglyceridemia 11/07/2014  . Apnea, sleep 11/07/2014  . Controlled type 2 diabetes mellitus without complication ([place]) 50/05/3817  . Pure hypercholesterolemia 11/07/2014  . Other abnormalities of gait and mobility 11/02/2011  . Decreased mobility 11/02/2011  . Abnormal gait 06/29/2011  . Discoordination 06/29/2011   Pura Spice, PT, DPT # [id number] 08/18/2019, 9:36 AM  [facility] [address] [place], [place], [address] Phone: [phone]   Fax:  [phone]  Name: Izak Anding MRN: [id number] Date of Birth: January 18, 1941

## 2019-08-19 NOTE — Progress Notes (Signed)
08/20/2019 11:55 AM   Mike Holloway April 27, 1941 [id number]  Referring provider: [name], MD [place],  [place] [address]  Chief Complaint  Patient presents with  . Urinary Retention    HPI: Mike Holloway is a 78 year old male with a history of urinary retention who presents today for follow up with his wife, Mike Holloway.  His wife states that he has now had fever dry times, less control of bladder function and when her husband stands up he completely soaks through his depends.  Patient denies any gross hematuria, dysuria or suprapubic/flank pain.  Patient denies any fevers, chills, nausea or vomiting.   They have discontinued the Myrbetriq 50 mg daily as instructed and have restarted the tamsulosin 0.4 mg daily.    SPT was removed on 05/10/2019.    UDS scheduled for 08/29/2019.    Today, he is having urgency, intermittency, hesitancy, straining to urinate and a weak stream.  Patient denies any gross hematuria, dysuria or suprapubic/flank pain.  Patient denies any fevers, chills, nausea or vomiting. I PSS: 16/5    PVR 191 mL.    IPSS    Row Name 08/20/19 1100         International Prostate Symptom Score   How often have you had the sensation of not emptying your bladder?  Not at All     How often have you had to urinate less than every two hours?  Less than 1 in 5 times     How often have you found you stopped and started again several times when you urinated?  Almost always     How often have you found it difficult to postpone urination?  About half the time     How often have you had a weak urinary stream?  More than half the time     How often have you had to strain to start urination?  About half the time     How many times did you typically get up at night to urinate?  None     Total IPSS Score  16       Quality of Life due to urinary symptoms   If you were to spend the rest of your life with your urinary condition just the way it is now how would you  feel about that?  Unhappy        PMH: Past Medical History:  Diagnosis Date  . Arthritis   . Atrial flutter ([place])   . Diabetes mellitus ([place])   . Essential tremor   . Essential tremor    deep brain stimulator   . Hypercholesteremia   . Hypertension   . Incontinence   . 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[place])    grew on the testical  . Sleep apnea   . Stroke [facility])     Surgical History: Past Surgical History:  Procedure Laterality Date  . ABLATION    . APPENDECTOMY    . CATARACT EXTRACTION, BILATERAL    . COLONOSCOPY WITH PROPOFOL N/A 03/17/2018   Procedure: COLONOSCOPY WITH PROPOFOL;  Surgeon: Jonathon Bellows, MD;  Location[facility] ENDOSCOPY;  Service: Gastroenterology;  Laterality: N/A;  . DEEP BRAIN STIMULATOR PLACEMENT    . 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 HEMORRHOID SURGERY    . HERNIA REPAIR    . HIP FRACTURE SURGERY    . INTRAMEDULLARY (  IM) NAIL INTERTROCHANTERIC Right 09/16/2018   Procedure: INTRAMEDULLARY (IM) NAIL INTERTROCHANTRIC;  Surgeon: Hessie Knows, MD;  Location: [facility] ORS;  Service: Orthopedics;  Laterality: Right;  . IR CATHETER TUBE CHANGE  11/22/2018  . NASAL SINUS SURGERY    . ORCHIECTOMY    . TONSILLECTOMY      Home Medications:  Allergies as of 08/20/2019      Reactions   Clindamycin Diarrhea   Contracted C. Diff x 2   Flagyl [metronidazole] Swelling   Swollen tongue, excessive shaking,     Ambien  [zolpidem]    Other reaction(s): Other (See Comments) Disoriented and moody Other reaction(s): Other (See Comments) Disoriented and moody   Penicillins Rash   Has patient had a PCN reaction causing immediate rash, facial/tongue/throat swelling, SOB or lightheadedness with hypotension: Unknown Has patient had a PCN reaction causing severe rash involving mucus membranes or skin necrosis: Unknown Has patient had a PCN reaction that  required hospitalization: No Has patient had a PCN reaction occurring within the last 10 years: No If all of the above answers are "NO", then may proceed with Cephalosporin use.   Sulfa Antibiotics Rash      Medication List       Accurate as of August 20, 2019 11:55 AM. If you have any questions, ask your nurse or doctor.        STOP taking these medications   cephALEXin 500 MG capsule Commonly known as: Keflex Stopped by: Isaac Dubie, PA-C   diclofenac sodium 1 % Gel Commonly known as: VOLTAREN Stopped by: Mykeria Garman, PA-C   mirabegron ER 50 MG Tb24 tablet Commonly known as: MYRBETRIQ Stopped by: Zara Council, PA-C     TAKE these medications   acetaminophen 650 MG CR tablet Commonly known as: TYLENOL Take 650 mg by mouth every 8 (eight) hours as needed for pain.   aspirin EC 325 MG tablet Take 1 tablet (325 mg total) by mouth daily.   Azelastine HCl 0.15 % Soln U 1 SPRAY IEN BID   cholecalciferol 25 MCG (1000 UT) tablet Commonly known as: VITAMIN D Take 1,000 Units by mouth at bedtime.   famotidine 20 MG tablet Commonly known as: PEPCID Take 20 mg by mouth 2 (two) times daily.   fexofenadine 180 MG tablet Commonly known as: ALLEGRA TK 1 T PO ONCE D   FLUoxetine 10 MG capsule Commonly known as: PROZAC Take 10 mg by mouth daily.   guaiFENesin-dextromethorphan 100-10 MG/5ML syrup Commonly known as: ROBITUSSIN DM Take 5 mLs by mouth every 4 (four) hours as needed for cough.   Melatonin 5 MG Tabs Take 5 mg by mouth at bedtime. Takes 2 3 mg tablets   primidone 50 MG tablet Commonly known as: MYSOLINE Take 50-100 mg by mouth 2 (two) times daily. Take 2 tablets (100mg ) by mouth every morning and 1 tablet (50mg ) by mouth every night at bedtime   propranolol ER 160 MG SR capsule Commonly known as: INDERAL LA Take 160 mg by mouth daily.   psyllium 95 % Pack Commonly known as: HYDROCIL/METAMUCIL Take 1 packet by mouth daily.   UNABLE TO FIND  APPLY FROM NECK DOWN DAILY   VSL#3 Caps Take 1 capsule by mouth daily.       Allergies:  Allergies  Allergen Reactions  . Clindamycin Diarrhea    Contracted C. Diff x 2  . Flagyl [Metronidazole] Swelling    Swollen tongue, excessive shaking,    . Ambien  [Zolpidem]  Other reaction(s): Other (See Comments) Disoriented and moody Other reaction(s): Other (See Comments) Disoriented and moody  . Penicillins Rash    Has patient had a PCN reaction causing immediate rash, facial/tongue/throat swelling, SOB or lightheadedness with hypotension: Unknown Has patient had a PCN reaction causing severe rash involving mucus membranes or skin necrosis: Unknown Has patient had a PCN reaction that required hospitalization: No Has patient had a PCN reaction occurring within the last 10 years: No If all of the above answers are "NO", then may proceed with Cephalosporin use.  . Sulfa Antibiotics Rash    Family History: Family History  Problem Relation Age of Onset  . Hematuria Father   . Prostate cancer Father   . Diabetes Father   . Heart disease Mother   . Diabetes Mother   . Kidney disease Neg Hx   . Bladder Cancer Neg Hx     Social History:  reports that he has never smoked. He has never used smokeless tobacco. He reports that he does not drink alcohol or use drugs.  ROS: UROLOGY Frequent Urination?: No Hard to postpone urination?: Yes Burning/pain with urination?: No Get up at night to urinate?: No Leakage of urine?: No Urine stream starts and stops?: Yes Trouble starting stream?: Yes Do you have to strain to urinate?: Yes Blood in urine?: No Urinary tract infection?: No Sexually transmitted disease?: No Injury to kidneys or bladder?: No Painful intercourse?: No Weak stream?: Yes Erection problems?: Yes Penile pain?: No  Gastrointestinal Nausea?: No Vomiting?: No Indigestion/heartburn?: No Diarrhea?: No Constipation?: No  Constitutional Fever: No Night  sweats?: No Weight loss?: No Fatigue?: No  Skin Skin rash/lesions?: No Itching?: No  Eyes Blurred vision?: No Double vision?: No  Ears/Nose/Throat Sore throat?: No Sinus problems?: Yes  Hematologic/Lymphatic Swollen glands?: No Easy bruising?: No  Cardiovascular Leg swelling?: No Chest pain?: No  Respiratory Cough?: No Shortness of breath?: No  Endocrine Excessive thirst?: No  Musculoskeletal Back pain?: No Joint pain?: No  Neurological Headaches?: No Dizziness?: No  Psychologic Depression?: Yes Anxiety?: No  Physical Exam: Constitutional:  Well nourished. Alert and oriented, No acute distress. HEENT: [place] AT, moist mucus membranes.  Trachea midline, no masses. Cardiovascular: No clubbing, cyanosis, or edema. Respiratory: Normal respiratory effort, no increased work of breathing. Neurologic: Grossly intact, no focal deficits, moving all 4 extremities. Psychiatric: Normal mood and affect.  Laboratory Data: No current labs    Pertinent Imaging  Results for DUNTE, BLINN (MRN [id number]) as of 08/20/2019 11:47  Ref. Range 08/20/2019 11:33  Scan Result Unknown 191     Assessment & Plan:    1. History of urinary retention Voiding good amounts PVR improved  UDS scheduled for 12/02   2. Incontinence Likely overflow incontinence Improving with CPAP   3. Nocturia Patient has acquired a CPAP machine and has been sleeping with it nightly He has noticed an improvement with the nocturia since using the [place], PA-C  [facility] [address], [place], [place] [address] [phone]

## 2019-08-20 ENCOUNTER — Other Ambulatory Visit: Payer: Self-pay

## 2019-08-20 ENCOUNTER — Encounter: Payer: Self-pay | Admitting: Urology

## 2019-08-20 ENCOUNTER — Ambulatory Visit (INDEPENDENT_AMBULATORY_CARE_PROVIDER_SITE_OTHER): Payer: Medicare Other | Admitting: Urology

## 2019-08-20 VITALS — BP 133/69 | HR 66

## 2019-08-20 DIAGNOSIS — N39498 Other specified urinary incontinence: Secondary | ICD-10-CM

## 2019-08-20 DIAGNOSIS — Z87898 Personal history of other specified conditions: Secondary | ICD-10-CM | POA: Diagnosis not present

## 2019-08-20 DIAGNOSIS — R351 Nocturia: Secondary | ICD-10-CM

## 2019-08-20 LAB — BLADDER SCAN AMB NON-IMAGING: Scan Result: 191

## 2019-08-21 ENCOUNTER — Encounter: Payer: Self-pay | Admitting: Physical Therapy

## 2019-08-21 ENCOUNTER — Ambulatory Visit: Payer: Medicare Other | Admitting: Physical Therapy

## 2019-08-21 DIAGNOSIS — R269 Unspecified abnormalities of gait and mobility: Secondary | ICD-10-CM

## 2019-08-21 DIAGNOSIS — M6281 Muscle weakness (generalized): Secondary | ICD-10-CM | POA: Diagnosis not present

## 2019-08-21 DIAGNOSIS — R293 Abnormal posture: Secondary | ICD-10-CM

## 2019-08-21 NOTE — Therapy (Signed)
[facility] [address] [place], [place], [address] Phone: [phone]   Fax:  [phone]  Physical Therapy Treatment  Patient Details  Name: Mike Holloway MRN: [id number] Date of Birth: 10-03-1940 Referring Provider (PT): Dr. Rudene Christians   Encounter Date: 08/21/2019  PT End of Session - 08/21/19 1548    Visit Number  29    Number of Visits  33    Date for PT Re-Evaluation  09/04/19    Authorization - Visit Number  5    Authorization - Number of Visits  10    PT Start Time  5974    PT Stop Time  1452    PT Time Calculation (min)  64 min    Equipment Utilized During Treatment  Gait belt    Activity Tolerance  Patient tolerated treatment well;Patient limited by pain    Behavior During Therapy  [facility] for tasks assessed/performed       Past Medical History:  Diagnosis Date  . Arthritis   . Atrial flutter ([place])   . Diabetes mellitus ([place])   . Essential tremor   . Essential tremor    deep brain stimulator   . Hypercholesteremia   . Hypertension   . Incontinence   . 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[place])    grew on the testical  . Sleep apnea   . Stroke [facility])     Past Surgical History:  Procedure Laterality Date  . ABLATION    . APPENDECTOMY    . CATARACT EXTRACTION, BILATERAL    . COLONOSCOPY WITH PROPOFOL N/A 03/17/2018   Procedure: COLONOSCOPY WITH PROPOFOL;  Surgeon: Jonathon Bellows, MD;  Location: [facility] ENDOSCOPY;  Service: Gastroenterology;  Laterality: N/A;  . DEEP BRAIN STIMULATOR PLACEMENT    . 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 HEMORRHOID SURGERY    . HERNIA REPAIR    . HIP FRACTURE SURGERY    . INTRAMEDULLARY (IM) NAIL INTERTROCHANTERIC Right 09/16/2018   Procedure: INTRAMEDULLARY (IM) NAIL INTERTROCHANTRIC;  Surgeon: Hessie Knows, MD;  Location: [facility] ORS;  Service: Orthopedics;  Laterality:  Right;  . IR CATHETER TUBE CHANGE  11/22/2018  . NASAL SINUS SURGERY    . ORCHIECTOMY    . TONSILLECTOMY      There were no vitals filed for this visit.  Subjective Assessment - 08/21/19 1352    Subjective  Pts. wife brought a copy of MRI report.  No significance with clinical impression on Radiologist report.  Pt. reports persistent R knee/ low back pain with standing and walking with rollator.    Patient is accompained by:  Family member    Pertinent History  Pt. states R knee is bothering him but ready to work with PT.  Pts. wife states that pt. is walking more at home.    Limitations  Lifting;Standing;Walking;House hold activities    Patient Stated Goals  Improve LE strength/ gait and balance with daily tasks.  Improve gait/ safety/ progression to least assistive device.    Currently in Pain?  Yes    Pain Score  3     Pain Location  Knee    Pain Orientation  Right    Pain Descriptors / Indicators  Aching        There.ex.:  Standing GTB ex.: scap. Retraction/ shoulder extension 30x without support in front of blue mat table Walking in //-bars with high marching/  resisted gait lateral walking 1BTB 5x each.   Nustep L4 10 min. B LE.  Moist heat to low back.    Neuro:  Seated blue mat table/ sit to stands/ wt. Shifting with no UE assist Fishing activities outside of //-bars (SBA/CGA)- 2x  (increase c/o low back pain).  Walking in PT clinic with cuing for increase step pattern (used agility ladder as visual guide) Seated/ standing posture correction (mirror feedback).     PT Long Term Goals - 08/08/19 1603      PT LONG TERM GOAL #1   Title  Pt. independent with HEP to increase B hip flexion/ R quad muscle strength 1/2 muscle grade to improve pain-free mobility.    Baseline  R knee extension limited to 4/5 MMT secondary to pain/ fear of pain.    Time  4    Period  Weeks    Status  Partially Met    Target Date  09/04/19      PT LONG TERM GOAL #2   Title  Pt. will  increase Berg balance test to >40 out of 56 to improve independence with gait/ decrease fall risk.    Baseline  Berg: 22/56 (significant fall risk); 9/30 32/56.  11/10: 32 (limited by R knee pain)    Time  4    Period  Weeks    Status  Not Met    Target Date  09/04/19      PT LONG TERM GOAL #3   Title  Pt. able to ambulate 100 feet with consistent 2 point gait pattern and use of least restrictive assistive devce to improve household mobility.    Baseline  Pt. able to ambulate with use of RW/ CGA to min. A for safety.   Heavy use of B UE.; 9/30 pt able to ambulate with rollator with inconsistent 2 point gait pattern, heavy reliance on UE CGA/supervision    Time  4    Period  Weeks    Status  Partially Met    Target Date  09/04/19      PT LONG TERM GOAL #4   Title  Pt. able to stand from normal chair with no UE assist to improve safety/independence with transfers.     Baseline  Unable to stand from standard chair without heavy UE assist. 10/6, pt unable to stand from standard chair without UE support.  11/10: benefits from 1 UE assist    Time  4    Period  Weeks    Status  Partially Met    Target Date  09/04/19            Plan - 08/21/19 1550    Clinical Impression Statement  Tx. progression remains limited by R knee/ low back pain with standing and walking tasks.  Pt. requires heavy UE assist with all wt. shifting/ walking activities.  PT working on Air cabin crew tasks and LE strengthening to improve safety/independence with gait.  Pt. benefits from moist heat on back during Nustep for low back pain mgmt.    Stability/Clinical Decision Making  Evolving/Moderate complexity    Clinical Decision Making  Moderate    Rehab Potential  Good    PT Frequency  2x / week    PT Duration  4 weeks    PT Treatment/Interventions  ADLs/Self Care Home Management;Therapeutic activities;Functional mobility training;Stair training;Gait training;Therapeutic exercise;Balance  training;Neuromuscular re-education;Patient/family education;Manual techniques;Passive range of motion    PT Next Visit Plan  LE strengthening, standing/ walking endurance, weight  shifting.    PT Home Exercise Plan  [email or web address]    Consulted and Agree with Plan of Care  Patient;Family member/caregiver    Family Member Consulted  spouse: [name]       Patient will benefit from skilled therapeutic intervention in order to improve the following deficits and impairments:  Abnormal gait, Decreased balance, Decreased endurance, Decreased mobility, Difficulty walking, Decreased range of motion, Decreased activity tolerance, Decreased strength, Impaired flexibility, Postural dysfunction, Pain  Visit Diagnosis: Muscle weakness (generalized)  Gait difficulty  Abnormal posture     Problem List Patient Active Problem List   Diagnosis Date Noted  . Lymphedema 04/15/2019  . Bilateral leg edema 01/25/2019  . Hyponatremia 12/24/2018  . SIADH (syndrome of inappropriate ADH production) ([place]) 12/24/2018  . Erosion of urethra due to catheterization of urinary tract ([place]) 10/27/2018  . Generalized weakness 10/14/2018  . Hip fracture ([place]) 09/15/2018  . 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 HLD (hyperlipidemia) 03/24/2018  . Contusion of right knee 02/14/2018  . Bradycardia 12/28/2017  . 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 Chronic pain of right knee 08/10/2016  . Right ankle pain 08/10/2016  . 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  . 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[place]) 12/11/2015  . Urinary retention 09/08/2015  . Lymphoma, 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[place]) 04/03/2015  . Breathlessness on exertion 11/21/2014  . Breath shortness 11/21/2014  . Arthropathy 11/07/2014  . Atrial flutter, paroxysmal ([place]) 11/07/2014  . Type 2 diabetes mellitus ([place]) 11/07/2014  . Benign essential tremor 11/07/2014   . Benign essential HTN 11/07/2014  . Mixed incontinence 11/07/2014  . Hypercholesterolemia without hypertriglyceridemia 11/07/2014  . Apnea, sleep 11/07/2014  . Controlled type 2 diabetes mellitus without complication ([place]) 09/19/8249  . Pure hypercholesterolemia 11/07/2014  . Other abnormalities of gait and mobility 11/02/2011  . Decreased mobility 11/02/2011  . Abnormal gait 06/29/2011  . Discoordination 06/29/2011   Pura Spice, PT, DPT # [id number] 08/21/2019, 5:20 PM  [facility] [address] [place], [place], [address] Phone: [phone]   Fax:  [phone]  Name: Mike Holloway MRN: [id number] Date of Birth: 04-12-1941

## 2019-08-22 ENCOUNTER — Telehealth: Payer: Self-pay

## 2019-08-22 NOTE — Telephone Encounter (Signed)
His MRI results are in and they would like for Dr. Holley Raring to call them with the results. His last check out order states he will call with the results. Please call today. They are anxious for the results.

## 2019-08-22 NOTE — Telephone Encounter (Signed)
Called patient to discuss right knee MRI results.  Right knee MRI is largely unremarkable except for fatty change/deposition of his medial gastrocnemius muscle.  I recommend the patient follow-up with orthopedics regarding his right knee MRI findings as I do question this fatty deposition of his medial gastrocnemius muscle.  So long as a surgery or a biopsy is not planned or indicated, I can offer the patient a trigger point injection in that region to see if it helps out.  I instructed the patient and his wife to call me with an update after they have seen orthopedics.

## 2019-08-28 ENCOUNTER — Encounter: Payer: Self-pay | Admitting: Physical Therapy

## 2019-08-28 ENCOUNTER — Ambulatory Visit: Payer: Medicare Other | Attending: Orthopedic Surgery | Admitting: Physical Therapy

## 2019-08-28 ENCOUNTER — Other Ambulatory Visit: Payer: Self-pay

## 2019-08-28 DIAGNOSIS — R269 Unspecified abnormalities of gait and mobility: Secondary | ICD-10-CM

## 2019-08-28 DIAGNOSIS — R293 Abnormal posture: Secondary | ICD-10-CM

## 2019-08-28 DIAGNOSIS — M6281 Muscle weakness (generalized): Secondary | ICD-10-CM | POA: Insufficient documentation

## 2019-08-28 NOTE — Therapy (Signed)
[facility] [address]. [place], [place], [address] Phone: [phone]   Fax:  [phone]  Physical Therapy Treatment  Patient Details  Name: Mike Holloway MRN: [id number] Date of Birth: 1940/12/15 Referring Provider (PT): Dr. Rudene Christians   Encounter Date: 08/28/2019  PT End of Session - 08/28/19 1722    Visit Number  30    Number of Visits  33    Date for PT Re-Evaluation  09/04/19    Authorization - Visit Number  6    Authorization - Number of Visits  10    PT Start Time  1346    PT Stop Time  1446    PT Time Calculation (min)  60 min    Equipment Utilized During Treatment  Gait belt    Activity Tolerance  Patient tolerated treatment well;Patient limited by pain    Behavior During Therapy  [facility] for tasks assessed/performed       Past Medical History:  Diagnosis Date  . Arthritis   . Atrial flutter ([place])   . Diabetes mellitus ([place])   . Essential tremor   . Essential tremor    deep brain stimulator   . Hypercholesteremia   . Hypertension   . Incontinence   . 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 Sleep apnea   . Stroke [facility])     Past Surgical History:  Procedure Laterality Date  . ABLATION    . APPENDECTOMY    . CATARACT EXTRACTION, BILATERAL    . COLONOSCOPY WITH PROPOFOL N/A 03/17/2018   Procedure: COLONOSCOPY WITH PROPOFOL;  Surgeon: Jonathon Bellows, MD;  Location: [facility] ENDOSCOPY;  Service: Gastroenterology;  Laterality: N/A;  . DEEP BRAIN STIMULATOR PLACEMENT    . 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 HEMORRHOID SURGERY    . HERNIA REPAIR    . HIP FRACTURE SURGERY    . INTRAMEDULLARY (IM) NAIL INTERTROCHANTERIC Right 09/16/2018   Procedure: INTRAMEDULLARY (IM) NAIL INTERTROCHANTRIC;  Surgeon: Hessie Knows, MD;  Location: [facility] ORS;  Service: Orthopedics;  Laterality:  Right;  . IR CATHETER TUBE CHANGE  11/22/2018  . NASAL SINUS SURGERY    . ORCHIECTOMY    . TONSILLECTOMY      There were no vitals filed for this visit.  Subjective Assessment - 08/28/19 1347    Subjective  Pt. able to get into shower and move chair himself while maintaining balance.  Pt. able to stand from shower chair in bathroom without use of arms and use legs 100%.  Pts. wife is happy with recent improvement in functional mobility.    Patient is accompained by:  Family member    Pertinent History  Pt. states R knee is bothering him but ready to work with PT.  Pts. wife states that pt. is walking more at home.    Limitations  Lifting;Standing;Walking;House hold activities    Patient Stated Goals  Improve LE strength/ gait and balance with daily tasks.  Improve gait/ safety/ progression to least assistive device.    Currently in Pain?  Yes       Dr. Holley Raring recommends pt. Contact Dr. Rudene Christians to dicsuss MRI results.      There.ex.:  Walking in //-bars with light UE assist 4x.  Mod. Cuing to correct posture Walking in //-bars with high marching/ resisted gait lateral walking 1BTB 5x each.  Nustep L4 10 min. B LE. Moist heat to low back.    Neuro:  Walking cone taps/ step touches in //-bars 8x.  Recip. Step pattern on 3" plinths/ obstacles with cuing to increase hip flexion/ step length/ heel strike.  Mirror feedback for posture correction.   1st step ups/ downs (limited by increase R knee pain)- unable to complete all 4 steps.  Heavy UE assist required.   Seated/ standing posture correction (mirror feedback).      PT Long Term Goals - 08/08/19 1603      PT LONG TERM GOAL #1   Title  Pt. independent with HEP to increase B hip flexion/ R quad muscle strength 1/2 muscle grade to improve pain-free mobility.    Baseline  R knee extension limited to 4/5 MMT secondary to pain/ fear of pain.    Time  4    Period  Weeks    Status  Partially Met    Target Date  09/04/19      PT  LONG TERM GOAL #2   Title  Pt. will increase Berg balance test to >40 out of 56 to improve independence with gait/ decrease fall risk.    Baseline  Berg: 22/56 (significant fall risk); 9/30 32/56.  11/10: 32 (limited by R knee pain)    Time  4    Period  Weeks    Status  Not Met    Target Date  09/04/19      PT LONG TERM GOAL #3   Title  Pt. able to ambulate 100 feet with consistent 2 point gait pattern and use of least restrictive assistive devce to improve household mobility.    Baseline  Pt. able to ambulate with use of RW/ CGA to min. A for safety.   Heavy use of B UE.; 9/30 pt able to ambulate with rollator with inconsistent 2 point gait pattern, heavy reliance on UE CGA/supervision    Time  4    Period  Weeks    Status  Partially Met    Target Date  09/04/19      PT LONG TERM GOAL #4   Title  Pt. able to stand from normal chair with no UE assist to improve safety/independence with transfers.     Baseline  Unable to stand from standard chair without heavy UE assist. 10/6, pt unable to stand from standard chair without UE support.  11/10: benefits from 1 UE assist    Time  4    Period  Weeks    Status  Partially Met    Target Date  09/04/19            Plan - 08/28/19 1722    Clinical Impression Statement  Significant increase in R knee pain with standing/ wt. bearing tasks.  Pt. only able to ascend 1 step today due to marked increase in R knee pain with L hip flexion.  Pts. wife is planning to contact Dr. Rudene Christians this week to discuss MRI report and limitations of R knee with standing/ walking tasks.    Stability/Clinical Decision Making  Evolving/Moderate complexity    Clinical Decision Making  Moderate    Rehab Potential  Good    PT Frequency  2x / week    PT Duration  4 weeks    PT Treatment/Interventions  ADLs/Self Care Home Management;Therapeutic activities;Functional mobility training;Stair training;Gait training;Therapeutic exercise;Balance training;Neuromuscular  re-education;Patient/family education;Manual techniques;Passive range of motion    PT Next Visit Plan  LE strengthening, standing/  walking endurance, weight shifting.    PT Home Exercise Plan  [email or web address]    Consulted and Agree with Plan of Care  Patient;Family member/caregiver    Family Member Consulted  spouse: [name]       Patient will benefit from skilled therapeutic intervention in order to improve the following deficits and impairments:  Abnormal gait, Decreased balance, Decreased endurance, Decreased mobility, Difficulty walking, Decreased range of motion, Decreased activity tolerance, Decreased strength, Impaired flexibility, Postural dysfunction, Pain  Visit Diagnosis: Muscle weakness (generalized)  Gait difficulty  Abnormal posture     Problem List Patient Active Problem List   Diagnosis Date Noted  . Lymphedema 04/15/2019  . Bilateral leg edema 01/25/2019  . Hyponatremia 12/24/2018  . SIADH (syndrome of inappropriate ADH production) ([place]) 12/24/2018  . Erosion of urethra due to catheterization of urinary tract ([place]) 10/27/2018  . Generalized weakness 10/14/2018  . Hip fracture ([place]) 09/15/2018  . 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 HLD (hyperlipidemia) 03/24/2018  . Contusion of right knee 02/14/2018  . Bradycardia 12/28/2017  . 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 Chronic pain of right knee 08/10/2016  . Right ankle pain 08/10/2016  . 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  . 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[place]) 12/11/2015  . Urinary retention 09/08/2015  . Lymphoma, 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[place]) 04/03/2015  . Breathlessness on exertion 11/21/2014  . Breath shortness 11/21/2014  . Arthropathy 11/07/2014  . Atrial flutter, paroxysmal ([name]) 11/07/2014  . Type 2 diabetes mellitus ([place]) 11/07/2014  . Benign essential tremor 11/07/2014  . Benign essential  HTN 11/07/2014  . Mixed incontinence 11/07/2014  . Hypercholesterolemia without hypertriglyceridemia 11/07/2014  . Apnea, sleep 11/07/2014  . Controlled type 2 diabetes mellitus without complication ([place]) 70/17/7939  . Pure hypercholesterolemia 11/07/2014  . Other abnormalities of gait and mobility 11/02/2011  . Decreased mobility 11/02/2011  . Abnormal gait 06/29/2011  . Discoordination 06/29/2011   Pura Spice, PT, DPT # [phone] 08/28/2019, 5:27 PM  [place] [facility] [address] [place], [place], [address] Phone: [phone]   Fax:  [phone]  Name: Mike Holloway MRN: [id number] Date of Birth: 01/23/1941

## 2019-08-30 ENCOUNTER — Other Ambulatory Visit: Payer: Self-pay

## 2019-08-30 ENCOUNTER — Encounter: Payer: Self-pay | Admitting: Physical Therapy

## 2019-08-30 ENCOUNTER — Telehealth: Payer: Self-pay | Admitting: *Deleted

## 2019-08-30 ENCOUNTER — Ambulatory Visit: Payer: Medicare Other | Admitting: Physical Therapy

## 2019-08-30 ENCOUNTER — Telehealth: Payer: Self-pay | Admitting: Urology

## 2019-08-30 DIAGNOSIS — R269 Unspecified abnormalities of gait and mobility: Secondary | ICD-10-CM

## 2019-08-30 DIAGNOSIS — M6281 Muscle weakness (generalized): Secondary | ICD-10-CM | POA: Diagnosis not present

## 2019-08-30 DIAGNOSIS — R293 Abnormal posture: Secondary | ICD-10-CM

## 2019-08-30 NOTE — Therapy (Signed)
[place] [facility] [address]. [place], [place], [address] Phone: [phone]   Fax:  [phone]  Physical Therapy Treatment  Patient Details  Name: Mike Holloway MRN: [id number] Date of Birth: 25-Feb-1941 Referring Provider (PT): Dr. Rudene Christians   Encounter Date: 08/30/2019  PT End of Session - 08/31/19 1348    Visit Number  31    Number of Visits  33    Date for PT Re-Evaluation  09/04/19    Authorization - Visit Number  7    Authorization - Number of Visits  10    PT Start Time  1344    PT Stop Time  1441    PT Time Calculation (min)  57 min    Equipment Utilized During Treatment  Gait belt    Activity Tolerance  Patient tolerated treatment well;Patient limited by pain    Behavior During Therapy  [facility] for tasks assessed/performed       Past Medical History:  Diagnosis Date  . Arthritis   . Atrial flutter ([place])   . Diabetes mellitus ([place])   . Essential tremor   . Essential tremor    deep brain stimulator   . Hypercholesteremia   . Hypertension   . Incontinence   . 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[place])    grew on the testical  . Sleep apnea   . Stroke [facility])     Past Surgical History:  Procedure Laterality Date  . ABLATION    . APPENDECTOMY    . CATARACT EXTRACTION, BILATERAL    . COLONOSCOPY WITH PROPOFOL N/A 03/17/2018   Procedure: COLONOSCOPY WITH PROPOFOL;  Surgeon: Jonathon Bellows, MD;  Location: [facility] ENDOSCOPY;  Service: Gastroenterology;  Laterality: N/A;  . DEEP BRAIN STIMULATOR PLACEMENT    . 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 HEMORRHOID SURGERY    . HERNIA REPAIR    . HIP FRACTURE SURGERY    . INTRAMEDULLARY (IM) NAIL INTERTROCHANTERIC Right 09/16/2018   Procedure: INTRAMEDULLARY (IM) NAIL INTERTROCHANTRIC;  Surgeon: Hessie Knows, MD;  Location: [facility] ORS;  Service: Orthopedics;  Laterality:  Right;  . IR CATHETER TUBE CHANGE  11/22/2018  . NASAL SINUS SURGERY    . ORCHIECTOMY    . TONSILLECTOMY      There were no vitals filed for this visit.  Subjective Assessment - 08/31/19 1345    Subjective  Pt. has appt. with Dr. Rudene Christians on 12/21 to discuss knee/ MRI results.  No LOB or new issues.  Pt. continues to require heavy UE use with rollator use/ transfers.    Patient is accompained by:  Family member    Pertinent History  Pt. states R knee is bothering him but ready to work with PT.  Pts. wife states that pt. is walking more at home.    Limitations  Lifting;Standing;Walking;House hold activities    Patient Stated Goals  Improve LE strength/ gait and balance with daily tasks.  Improve gait/ safety/ progression to least assistive device.    Currently in Pain?  Yes    Pain Score  3     Pain Location  Knee    Pain Orientation  Right         There.ex.:  Nustep L4-5 10 min. B LE.Moist heat to low back. Walking in //-bars (forward/ backwards/ lateral) with light UE assist 4x.  Mod. Cuing to correct posture Walking in //-bars with  high marching/ resisted gait lateral walking 1BTB 5x each. Seated R knee reassessment/ grade II AP/PA mobs. 2x30 sec. (no change in pain with wt. Bearing).  Neuro:  Walking cone taps/ step touches in //-bars 8x.  Obstacles with cuing to increase hip flexion/ step length/ heel strike.  Mirror feedback for posture correction.   1st step ups/ downs (limited by increase R knee pain)- unable to complete more than 1 step today due to R knee pain.  Heavy UE assist required.   Turning CW/CCW in //-bars with decrease UE assist Seated/ standing posture correction (mirror feedback).     PT Long Term Goals - 08/08/19 1603      PT LONG TERM GOAL #1   Title  Pt. independent with HEP to increase B hip flexion/ R quad muscle strength 1/2 muscle grade to improve pain-free mobility.    Baseline  R knee extension limited to 4/5 MMT secondary to pain/ fear  of pain.    Time  4    Period  Weeks    Status  Partially Met    Target Date  09/04/19      PT LONG TERM GOAL #2   Title  Pt. will increase Berg balance test to >40 out of 56 to improve independence with gait/ decrease fall risk.    Baseline  Berg: 22/56 (significant fall risk); 9/30 32/56.  11/10: 32 (limited by R knee pain)    Time  4    Period  Weeks    Status  Not Met    Target Date  09/04/19      PT LONG TERM GOAL #3   Title  Pt. able to ambulate 100 feet with consistent 2 point gait pattern and use of least restrictive assistive devce to improve household mobility.    Baseline  Pt. able to ambulate with use of RW/ CGA to min. A for safety.   Heavy use of B UE.; 9/30 pt able to ambulate with rollator with inconsistent 2 point gait pattern, heavy reliance on UE CGA/supervision    Time  4    Period  Weeks    Status  Partially Met    Target Date  09/04/19      PT LONG TERM GOAL #4   Title  Pt. able to stand from normal chair with no UE assist to improve safety/independence with transfers.     Baseline  Unable to stand from standard chair without heavy UE assist. 10/6, pt unable to stand from standard chair without UE support.  11/10: benefits from 1 UE assist    Time  4    Period  Weeks    Status  Partially Met    Target Date  09/04/19            Plan - 08/31/19 1348    Clinical Impression Statement  R knee pain with wt. bearing/ standing tasks limits progression of balance tasks/ walking with [place] crutches/ lesser assistive device.  No benefit from manual tx. to R knee.  Pt. unable to complete ascending 3 stairs due to R knee pain.  No change in HEP.    Stability/Clinical Decision Making  Evolving/Moderate complexity    Clinical Decision Making  Moderate    Rehab Potential  Good    PT Frequency  2x / week    PT Duration  4 weeks    PT Treatment/Interventions  ADLs/Self Care Home Management;Therapeutic activities;Functional mobility training;Stair training;Gait  training;Therapeutic exercise;Balance training;Neuromuscular re-education;Patient/family education;Manual techniques;Passive range of  motion    PT Next Visit Plan  LE strengthening, standing/ walking endurance, weight shifting.    PT Home Exercise Plan  [email or web address]    Consulted and Agree with Plan of Care  Patient;Family member/caregiver    Family Member Consulted  spouse: [name]       Patient will benefit from skilled therapeutic intervention in order to improve the following deficits and impairments:  Abnormal gait, Decreased balance, Decreased endurance, Decreased mobility, Difficulty walking, Decreased range of motion, Decreased activity tolerance, Decreased strength, Impaired flexibility, Postural dysfunction, Pain  Visit Diagnosis: Muscle weakness (generalized)  Gait difficulty  Abnormal posture     Problem List Patient Active Problem List   Diagnosis Date Noted  . Lymphedema 04/15/2019  . Bilateral leg edema 01/25/2019  . Hyponatremia 12/24/2018  . SIADH (syndrome of inappropriate ADH production) ([place]) 12/24/2018  . Erosion of urethra due to catheterization of urinary tract ([place]) 10/27/2018  . Generalized weakness 10/14/2018  . Hip fracture ([place]) 09/15/2018  . 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 HLD (hyperlipidemia) 03/24/2018  . Contusion of right knee 02/14/2018  . Bradycardia 12/28/2017  . 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 Chronic pain of right knee 08/10/2016  . Right ankle pain 08/10/2016  . 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  . 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[place]) 12/11/2015  . Urinary retention 09/08/2015  . Lymphoma, 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[place]) 04/03/2015  . Breathlessness on exertion 11/21/2014  . Breath shortness 11/21/2014  . Arthropathy 11/07/2014  . Atrial flutter, paroxysmal ([place]) 11/07/2014  . Type 2 diabetes mellitus ([place]) 11/07/2014   . Benign essential tremor 11/07/2014  . Benign essential HTN 11/07/2014  . Mixed incontinence 11/07/2014  . Hypercholesterolemia without hypertriglyceridemia 11/07/2014  . Apnea, sleep 11/07/2014  . Controlled type 2 diabetes mellitus without complication ([place]) 04/79/9872  . Pure hypercholesterolemia 11/07/2014  . Other abnormalities of gait and mobility 11/02/2011  . Decreased mobility 11/02/2011  . Abnormal gait 06/29/2011  . Discoordination 06/29/2011   Pura Spice, PT, DPT # [phone] 08/31/2019, 2:12 PM  [facility] [address] [place], [place], [address] Phone: [phone]   Fax:  [phone]  Name: Mike Holloway MRN: [id number] Date of Birth: 04/15/1941

## 2019-08-30 NOTE — Telephone Encounter (Signed)
Pt went to [facility] for [employer] and machine was broke when pt went for appt.  They had to r/s for 12/16.  She wanted to make you aware.

## 2019-09-04 ENCOUNTER — Encounter: Payer: Self-pay | Admitting: Physical Therapy

## 2019-09-04 ENCOUNTER — Other Ambulatory Visit: Payer: Self-pay

## 2019-09-04 ENCOUNTER — Ambulatory Visit: Payer: Medicare Other | Admitting: Physical Therapy

## 2019-09-04 DIAGNOSIS — M6281 Muscle weakness (generalized): Secondary | ICD-10-CM

## 2019-09-04 DIAGNOSIS — R269 Unspecified abnormalities of gait and mobility: Secondary | ICD-10-CM

## 2019-09-04 DIAGNOSIS — R293 Abnormal posture: Secondary | ICD-10-CM

## 2019-09-04 NOTE — Therapy (Signed)
[facility] [address]. [place], [place], [address] Phone: [phone]   Fax:  [phone]  Physical Therapy Treatment  Patient Details  Name: Mike Holloway MRN: [id number] Date of Birth: 27-Feb-1941 Referring Provider (PT): Dr. Rudene Christians   Encounter Date: 09/04/2019  PT End of Session - 09/07/19 1803    Visit Number  32    Number of Visits  33    Date for PT Re-Evaluation  09/04/19    Authorization - Visit Number  8    Authorization - Number of Visits  10    PT Start Time  9242    PT Stop Time  1428    PT Time Calculation (min)  45 min    Equipment Utilized During Treatment  Gait belt    Activity Tolerance  Patient tolerated treatment well;Patient limited by pain    Behavior During Therapy  [facility] for tasks assessed/performed       Past Medical History:  Diagnosis Date  . Arthritis   . Atrial flutter ([place])   . Diabetes mellitus ([place])   . Essential tremor   . Essential tremor    deep brain stimulator   . Hypercholesteremia   . Hypertension   . Incontinence   . 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[place])    grew on the testical  . Sleep apnea   . Stroke [facility])     Past Surgical History:  Procedure Laterality Date  . ABLATION    . APPENDECTOMY    . CATARACT EXTRACTION, BILATERAL    . COLONOSCOPY WITH PROPOFOL N/A 03/17/2018   Procedure: COLONOSCOPY WITH PROPOFOL;  Surgeon: Jonathon Bellows, MD;  Location: [facility] ENDOSCOPY;  Service: Gastroenterology;  Laterality: N/A;  . DEEP BRAIN STIMULATOR PLACEMENT    . 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 HEMORRHOID SURGERY    . HERNIA REPAIR    . HIP FRACTURE SURGERY    . INTRAMEDULLARY (IM) NAIL INTERTROCHANTERIC Right 09/16/2018   Procedure: INTRAMEDULLARY (IM) NAIL INTERTROCHANTRIC;  Surgeon: Hessie Knows, MD;  Location: [facility] ORS;  Service: Orthopedics;  Laterality:  Right;  . IR CATHETER TUBE CHANGE  11/22/2018  . NASAL SINUS SURGERY    . ORCHIECTOMY    . TONSILLECTOMY      There were no vitals filed for this visit.  Subjective Assessment - 09/07/19 1800    Subjective  No new complaints.  Pts. wife is working on getting pt. more independent in bathroom with standing from commode.  Pt. continues to require bed side commode/ elevated toilet.    Patient is accompained by:  Family member    Pertinent History  Pt. states R knee is bothering him but ready to work with PT.  Pts. wife states that pt. is walking more at home.    Limitations  Lifting;Standing;Walking;House hold activities    Patient Stated Goals  Improve LE strength/ gait and balance with daily tasks.  Improve gait/ safety/ progression to least assistive device.    Currently in Pain?  Yes    Pain Score  3     Pain Location  Knee    Pain Orientation  Right    Pain Descriptors / Indicators  Aching    Pain Type  Chronic pain           Getting out on edge of bed more independently wife reports.  Pt. Starts bed on R  side and then back/ L side.     There.ex.:  Nustep L5 10 min. B LE.Moist heat to low back. Walking in //-bars (forward/ backwards/ lateral) with light UE assist 4x.Mod. Cuing to correct posture Walking in //-bars with high marching/ lateral walking.   Supine lumbar rotn/ marching/ supine LE generalized stretches.    Neuro:  Functional reaching/ rotation with cones at elevated hospital table (2 min. 15 sec. Then seated break due to increase low back pain on R side). Turning CW/CCW in //-bars with decrease UE assist Seated/ standing posture correction (mirror feedback).     PT Long Term Goals - 08/08/19 1603      PT LONG TERM GOAL #1   Title  Pt. independent with HEP to increase B hip flexion/ R quad muscle strength 1/2 muscle grade to improve pain-free mobility.    Baseline  R knee extension limited to 4/5 MMT secondary to pain/ fear of pain.    Time   4    Period  Weeks    Status  Partially Met    Target Date  09/04/19      PT LONG TERM GOAL #2   Title  Pt. will increase Berg balance test to >40 out of 56 to improve independence with gait/ decrease fall risk.    Baseline  Berg: 22/56 (significant fall risk); 9/30 32/56.  11/10: 32 (limited by R knee pain)    Time  4    Period  Weeks    Status  Not Met    Target Date  09/04/19      PT LONG TERM GOAL #3   Title  Pt. able to ambulate 100 feet with consistent 2 point gait pattern and use of least restrictive assistive devce to improve household mobility.    Baseline  Pt. able to ambulate with use of RW/ CGA to min. A for safety.   Heavy use of B UE.; 9/30 pt able to ambulate with rollator with inconsistent 2 point gait pattern, heavy reliance on UE CGA/supervision    Time  4    Period  Weeks    Status  Partially Met    Target Date  09/04/19      PT LONG TERM GOAL #4   Title  Pt. able to stand from normal chair with no UE assist to improve safety/independence with transfers.     Baseline  Unable to stand from standard chair without heavy UE assist. 10/6, pt unable to stand from standard chair without UE support.  11/10: benefits from 1 UE assist    Time  4    Period  Weeks    Status  Partially Met    Target Date  09/04/19         Plan - 09/07/19 1804    Clinical Impression Statement  Pt. able to stand from 19 inch mat table/ chair with UE assist safely with mod. I.  Pt. limited by R knee pain/ fatigue with repetitive sit to stands.  Heavy UE assist t/o tx. with standing therex./ walking.  Increase c/o low back pain with prolonged standing for functional reaching/ wt. shifting tasks.  R knee/ low back pain resolves quickly with seated breaks.    Stability/Clinical Decision Making  Evolving/Moderate complexity    Clinical Decision Making  Moderate    Rehab Potential  Good    PT Frequency  2x / week    PT Duration  4 weeks    PT Treatment/Interventions  ADLs/Self Care Home  Management;Therapeutic activities;Functional mobility training;Stair training;Gait training;Therapeutic exercise;Balance training;Neuromuscular re-education;Patient/family education;Manual techniques;Passive range of motion    PT Next Visit Plan  LE strengthening, standing/ walking endurance, weight shifting.   Check goals/ Recert next tx.  (continue PT until MD f/u 12/21).    PT Home Exercise Plan  [email or web address]    Consulted and Agree with Plan of Care  Patient;Family member/caregiver    Family Member Consulted  spouse: [name]       Patient will benefit from skilled therapeutic intervention in order to improve the following deficits and impairments:  Abnormal gait, Decreased balance, Decreased endurance, Decreased mobility, Difficulty walking, Decreased range of motion, Decreased activity tolerance, Decreased strength, Impaired flexibility, Postural dysfunction, Pain  Visit Diagnosis: Muscle weakness (generalized)  Gait difficulty  Abnormal posture     Problem List Patient Active Problem List   Diagnosis Date Noted  . Lymphedema 04/15/2019  . Bilateral leg edema 01/25/2019  . Hyponatremia 12/24/2018  . SIADH (syndrome of inappropriate ADH production) ([place]) 12/24/2018  . Erosion of urethra due to catheterization of urinary tract ([place]) 10/27/2018  . Generalized weakness 10/14/2018  . Hip fracture ([place]) 09/15/2018  . 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 HLD (hyperlipidemia) 03/24/2018  . Contusion of right knee 02/14/2018  . Bradycardia 12/28/2017  . 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 Chronic pain of right knee 08/10/2016  . Right ankle pain 08/10/2016  . 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  . 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[place]) 12/11/2015  . Urinary retention 09/08/2015  . Lymphoma, 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[place]) 04/03/2015  . Breathlessness on exertion 11/21/2014  .  Breath shortness 11/21/2014  . Arthropathy 11/07/2014  . Atrial flutter, paroxysmal ([place]) 11/07/2014  . Type 2 diabetes mellitus ([place]) 11/07/2014  . Benign essential tremor 11/07/2014  . Benign essential HTN 11/07/2014  . Mixed incontinence 11/07/2014  . Hypercholesterolemia without hypertriglyceridemia 11/07/2014  . Apnea, sleep 11/07/2014  . Controlled type 2 diabetes mellitus without complication ([place]) 72/15/8727  . Pure hypercholesterolemia 11/07/2014  . Other abnormalities of gait and mobility 11/02/2011  . Decreased mobility 11/02/2011  . Abnormal gait 06/29/2011  . Discoordination 06/29/2011   Pura Spice, PT, DPT # [phone] 09/07/2019, 6:18 PM  [place] [facility] [address] [place], [place], [address] Phone: [phone]   Fax:  [phone]  Name: Danney Bungert MRN: [id number] Date of Birth: June 24, 1941

## 2019-09-06 ENCOUNTER — Encounter: Payer: Medicare Other | Admitting: Physical Therapy

## 2019-09-06 ENCOUNTER — Other Ambulatory Visit: Payer: Self-pay

## 2019-09-06 ENCOUNTER — Ambulatory Visit: Payer: Medicare Other | Admitting: Physical Therapy

## 2019-09-06 DIAGNOSIS — R269 Unspecified abnormalities of gait and mobility: Secondary | ICD-10-CM

## 2019-09-06 DIAGNOSIS — M6281 Muscle weakness (generalized): Secondary | ICD-10-CM

## 2019-09-06 DIAGNOSIS — R293 Abnormal posture: Secondary | ICD-10-CM

## 2019-09-09 ENCOUNTER — Encounter: Payer: Self-pay | Admitting: Physical Therapy

## 2019-09-09 NOTE — Therapy (Signed)
[place] [facility] [address]. [place], [place], [address] Phone: [phone]   Fax:  [phone]  Physical Therapy Treatment  Patient Details  Name: Mike Holloway MRN: [id number] Date of Birth: [birth year] Referring Provider (PT): Dr. Rudene Christians   Encounter Date: 09/06/2019  PT End of Session - 09/09/19 0918    Visit Number  33    Number of Visits  41    Date for PT Re-Evaluation  10/04/19    Authorization - Visit Number  1    Authorization - Number of Visits  10    PT Start Time  7544    PT Stop Time  1520    PT Time Calculation (min)  52 min    Equipment Utilized During Treatment  Gait belt    Activity Tolerance  Patient tolerated treatment well;Patient limited by pain    Behavior During Therapy  [facility] for tasks assessed/performed       Past Medical History:  Diagnosis Date  . Arthritis   . Atrial flutter ([place])   . Diabetes mellitus ([place])   . Essential tremor   . Essential tremor    deep brain stimulator   . Hypercholesteremia   . Hypertension   . Incontinence   . 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 Sleep apnea   . Stroke [facility])     Past Surgical History:  Procedure Laterality Date  . ABLATION    . APPENDECTOMY    . CATARACT EXTRACTION, BILATERAL    . COLONOSCOPY WITH PROPOFOL N/A 03/17/2018   Procedure: COLONOSCOPY WITH PROPOFOL;  Surgeon: Jonathon Bellows, MD;  Location[facility] ENDOSCOPY;  Service: Gastroenterology;  Laterality: N/A;  . DEEP BRAIN STIMULATOR PLACEMENT    . 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 HEMORRHOID SURGERY    . HERNIA REPAIR    . HIP FRACTURE SURGERY    . INTRAMEDULLARY (IM) NAIL INTERTROCHANTERIC Right 09/16/2018   Procedure: INTRAMEDULLARY (IM) NAIL INTERTROCHANTRIC;  Surgeon: Hessie Knows, MD;  Location: [facility] ORS;  Service: Orthopedics;  Laterality:  Right;  . IR CATHETER TUBE CHANGE  11/22/2018  . NASAL SINUS SURGERY    . ORCHIECTOMY    . TONSILLECTOMY      There were no vitals filed for this visit.  Subjective Assessment - 09/09/19 0916    Subjective  Pt. states toilet height at home is 17" and pt. has grab bar/ door edge to pull up on.  Pt. wife really wants pt. to get back to using commode instead of 3 in 1 commode.    Patient is accompained by:  Family member    Pertinent History  Pt. states R knee is bothering him but ready to work with PT.  Pts. wife states that pt. is walking more at home.    Limitations  Lifting;Standing;Walking;House hold activities    Patient Stated Goals  Improve LE strength/ gait and balance with daily tasks.  Improve gait/ safety/ progression to least assistive device.    Currently in Pain?  Yes    Pain Score  3     Pain Location  Knee    Pain Orientation  Right         [facility] PT Assessment - 09/09/19 0001      Assessment   Medical Diagnosis  S/p R subtrochanteric hip fracture, S/p R ORIF fracture of  hip.      Referring Provider (PT)  Dr. Rudene Christians    Onset Date/Surgical Date  09/15/18    Prior Therapy  Yes, known well to PT      Prior Function   Level of Independence  Needs assistance with gait      Cognition   Overall Cognitive Status  Within Functional Limits for tasks assessed        There.ex.:  Nustep L510 min. B LE.Moist heat to low back. Walking in //-bars with high marching/ lateral walking.   Standing hip extension/ lunge 10x with heavy UE assist Reviewed HEP    Neuro:  Sit to stands from blue mat table (varying heights)- working on standing from 17" height to simulate commode transfers. Focus on sit to stands today (technique changes/ UE assist with pull ups)- instructed pts. Wife in technique.       PT Long Term Goals - 09/09/19 0931      PT LONG TERM GOAL #1   Title  Pt. independent with HEP to increase B hip flexion/ R quad muscle strength 1/2 muscle grade  to improve pain-free mobility.    Baseline  R knee extension limited to 4/5 MMT secondary to pain/ fear of pain.    Time  4    Period  Weeks    Status  Partially Met    Target Date  10/04/19      PT LONG TERM GOAL #2   Title  Pt. will increase Berg balance test to >40 out of 56 to improve independence with gait/ decrease fall risk.    Baseline  Berg: 22/56 (significant fall risk); 9/30 32/56.  11/10: 32 (limited by R knee pain)    Time  4    Period  Weeks    Status  Not Met    Target Date  10/04/19      PT LONG TERM GOAL #3   Title  Pt. able to ambulate 100 feet with consistent 2 point gait pattern and use of least restrictive assistive devce to improve household mobility.    Baseline  Pt. able to ambulate with use of RW/ CGA to min. A for safety.   Heavy use of B UE.; 9/30 pt able to ambulate with rollator with inconsistent 2 point gait pattern, heavy reliance on UE CGA/supervision    Time  4    Period  Weeks    Status  Partially Met    Target Date  10/04/19      PT LONG TERM GOAL #4   Title  Pt. able to stand from normal chair with no UE assist to improve safety/independence with transfers.     Baseline  Unable to stand from standard chair without heavy UE assist. 10/6, pt unable to stand from standard chair without UE support.  11/10: benefits from 1 UE assist    Time  4    Period  Weeks    Status  Partially Met    Target Date  10/04/19            Plan - 09/09/19 9476    Clinical Impression Statement  Pt. continues to be significantly limited by R knee pain with wt. bearing/ standing tasks/ walking with [place] crutches/ lesser assistive device. Pt. unable to complete ascending or descending 3 stairs due to R knee pain.  PT focusing on improving pts. independence with transfers/ sit to stands from commode to improve pts. independence.  No falls or LOB reported over past month.  Pt. will continue to benefit from skilled PT services to increase LE strengthening/ balance and  independence with transfers.    Stability/Clinical Decision Making  Evolving/Moderate complexity    Clinical Decision Making  Moderate    Rehab Potential  Good    PT Frequency  2x / week    PT Duration  4 weeks    PT Treatment/Interventions  ADLs/Self Care Home Management;Therapeutic activities;Functional mobility training;Stair training;Gait training;Therapeutic exercise;Balance training;Neuromuscular re-education;Patient/family education;Manual techniques;Passive range of motion    PT Next Visit Plan  LE strengthening, standing/ walking endurance, weight shifting.  (continue PT until MD f/u 12/21).    PT Home Exercise Plan  [id number]    Consulted and Agree with Plan of Care  Patient;Family member/caregiver    Family Member Consulted  spouse: [name]       Patient will benefit from skilled therapeutic intervention in order to improve the following deficits and impairments:  Abnormal gait, Decreased balance, Decreased endurance, Decreased mobility, Difficulty walking, Decreased range of motion, Decreased activity tolerance, Decreased strength, Impaired flexibility, Postural dysfunction, Pain  Visit Diagnosis: Muscle weakness (generalized)  Gait difficulty  Abnormal posture     Problem List Patient Active Problem List   Diagnosis Date Noted  . Lymphedema 04/15/2019  . Bilateral leg edema 01/25/2019  . Hyponatremia 12/24/2018  . SIADH (syndrome of inappropriate ADH production) ([place]) 12/24/2018  . Erosion of urethra due to catheterization of urinary tract ([place]) 10/27/2018  . Generalized weakness 10/14/2018  . Hip fracture ([place]) 09/15/2018  . 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 HLD (hyperlipidemia) 03/24/2018  . Contusion of right knee 02/14/2018  . Bradycardia 12/28/2017  . 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 Chronic pain of right knee 08/10/2016  .  Right ankle pain 08/10/2016  . 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  . 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[place]) 12/11/2015  . Urinary retention 09/08/2015  . Lymphoma, 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[place]) 04/03/2015  . Breathlessness on exertion 11/21/2014  . Breath shortness 11/21/2014  . Arthropathy 11/07/2014  . Atrial flutter, paroxysmal ([place]) 11/07/2014  . Type 2 diabetes mellitus ([place]) 11/07/2014  . Benign essential tremor 11/07/2014  . Benign essential HTN 11/07/2014  . Mixed incontinence 11/07/2014  . Hypercholesterolemia without hypertriglyceridemia 11/07/2014  . Apnea, sleep 11/07/2014  . Controlled type 2 diabetes mellitus without complication ([place]) 63/09/6008  . Pure hypercholesterolemia 11/07/2014  . Other abnormalities of gait and mobility 11/02/2011  . Decreased mobility 11/02/2011  . Abnormal gait 06/29/2011  . Discoordination 06/29/2011   Pura Spice, PT, DPT # [phone] 09/09/2019, 9:40 AM  [place] [facility] [address] [place], [place], [address] Phone: [phone]   Fax:  [phone]  Name: Tanvir Hipple MRN: [id number] Date of Birth: 03/20/1941

## 2019-09-11 ENCOUNTER — Ambulatory Visit: Payer: Medicare Other | Admitting: Physical Therapy

## 2019-09-11 ENCOUNTER — Encounter: Payer: Medicare Other | Admitting: Physical Therapy

## 2019-09-13 ENCOUNTER — Encounter: Payer: Self-pay | Admitting: Physical Therapy

## 2019-09-13 ENCOUNTER — Ambulatory Visit: Payer: Medicare Other | Admitting: Physical Therapy

## 2019-09-13 ENCOUNTER — Encounter: Payer: Medicare Other | Admitting: Physical Therapy

## 2019-09-13 ENCOUNTER — Other Ambulatory Visit: Payer: Self-pay

## 2019-09-13 DIAGNOSIS — M6281 Muscle weakness (generalized): Secondary | ICD-10-CM

## 2019-09-13 DIAGNOSIS — R269 Unspecified abnormalities of gait and mobility: Secondary | ICD-10-CM

## 2019-09-13 DIAGNOSIS — R293 Abnormal posture: Secondary | ICD-10-CM

## 2019-09-13 NOTE — Therapy (Addendum)
[place] [facility] [address] [place], [place], [address] Phone: [phone]   Fax:  [phone]  Physical Therapy Treatment  Patient Details  Name: Mike Holloway MRN: [id number] Date of Birth: [birth year] Referring Provider (PT): Dr. Rudene Christians   Encounter Date: 09/13/2019   Treatment: 34 of 41.  Recert date: 05/01/276 4128 to 1407   Past Medical History:  Diagnosis Date  . Arthritis   . Atrial flutter ([place])   . Diabetes mellitus ([place])   . Essential tremor   . Essential tremor    deep brain stimulator   . Hypercholesteremia   . Hypertension   . Incontinence   . 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 Sleep apnea   . Stroke [facility])     Past Surgical History:  Procedure Laterality Date  . ABLATION    . APPENDECTOMY    . CATARACT EXTRACTION, BILATERAL    . COLONOSCOPY WITH PROPOFOL N/A 03/17/2018   Procedure: COLONOSCOPY WITH PROPOFOL;  Surgeon: Jonathon Bellows, MD;  Location: [facility] ENDOSCOPY;  Service: Gastroenterology;  Laterality: N/A;  . DEEP BRAIN STIMULATOR PLACEMENT    . 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 HEMORRHOID SURGERY    . HERNIA REPAIR    . HIP FRACTURE SURGERY    . INTRAMEDULLARY (IM) NAIL INTERTROCHANTERIC Right 09/16/2018   Procedure: INTRAMEDULLARY (IM) NAIL INTERTROCHANTRIC;  Surgeon: Hessie Knows, MD;  Location: [facility] ORS;  Service: Orthopedics;  Laterality: Right;  . IR CATHETER TUBE CHANGE  11/22/2018  . NASAL SINUS SURGERY    . ORCHIECTOMY    . TONSILLECTOMY      There were no vitals filed for this visit.     Pt. apologized for missing last appt. Pts. wife reports she got the times wrong on the schedule sheet.     There.ex.:  Nustep L510 min. B LE.Moist heat to low back. Walking in //-bars with high marching/lateral walking. Seated LAQ/ hip  flexion/ abduction 20x.  MMT reassessment. Discuss HEP/ supine ex.    Neuro:  Sit to stands from blue mat table (varying heights)- working on standing from 17" height to simulate commode transfers.  Transfers to chair next to mat table 3x (no rollator)- significant pain in R knee Walking in //-bars with 3" plinth step ups/ overs 3x with recip. Pattern and cuing to decrease UE assist/ wt. Bearing.   Standing step touches (toe/ heel)- B UE assist 10x2   PT Long Term Goals - 09/09/19 0931      PT LONG TERM GOAL #1   Title  Pt. independent with HEP to increase B hip flexion/ R quad muscle strength 1/2 muscle grade to improve pain-free mobility.    Baseline  R knee extension limited to 4/5 MMT secondary to pain/ fear of pain.    Time  4    Period  Weeks    Status  Partially Met    Target Date  10/04/19      PT LONG TERM GOAL #2   Title  Pt. will increase Berg balance test to >40 out of 56 to improve independence with gait/ decrease fall risk.    Baseline  Berg: 22/56 (significant fall risk); 9/30 32/56.  11/10: 32 (limited by R knee pain)    Time  4    Period  Weeks    Status  Not Met  Target Date  10/04/19      PT LONG TERM GOAL #3   Title  Pt. able to ambulate 100 feet with consistent 2 point gait pattern and use of least restrictive assistive devce to improve household mobility.    Baseline  Pt. able to ambulate with use of RW/ CGA to min. A for safety.   Heavy use of B UE.; 9/30 pt able to ambulate with rollator with inconsistent 2 point gait pattern, heavy reliance on UE CGA/supervision    Time  4    Period  Weeks    Status  Partially Met    Target Date  10/04/19      PT LONG TERM GOAL #4   Title  Pt. able to stand from normal chair with no UE assist to improve safety/independence with transfers.     Baseline  Unable to stand from standard chair without heavy UE assist. 10/6, pt unable to stand from standard chair without UE support.  11/10: benefits from 1 UE assist     Time  4    Period  Weeks    Status  Partially Met    Target Date  10/04/19         R knee pain continues to limit any progression with sit to stands/ walking with less assistance due to c/o significant pain. Pt. returns to Dr. Rudene Christians next week to discuss status of R knee/ POC. Pt. able to stand with UE assist from 17 inch mat table but remains limited at home with sit to stands from commode. Pt. states he has attempted to get on commode but R knee pain limits ability to safely complete task. No falls or LOB over past month but requires heavy UE assist on rollator with standing/walking.      Patient will benefit from skilled therapeutic intervention in order to improve the following deficits and impairments:  Abnormal gait, Decreased balance, Decreased endurance, Decreased mobility, Difficulty walking, Decreased range of motion, Decreased activity tolerance, Decreased strength, Impaired flexibility, Postural dysfunction, Pain  Visit Diagnosis: Muscle weakness (generalized)  Gait difficulty  Abnormal posture     Problem List Patient Active Problem List   Diagnosis Date Noted  . Lymphedema 04/15/2019  . Bilateral leg edema 01/25/2019  . Hyponatremia 12/24/2018  . SIADH (syndrome of inappropriate ADH production) ([place]) 12/24/2018  . Erosion of urethra due to catheterization of urinary tract ([place]) 10/27/2018  . Generalized weakness 10/14/2018  . Hip fracture ([place]) 09/15/2018  . 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 HLD (hyperlipidemia) 03/24/2018  . Contusion of right knee 02/14/2018  . Bradycardia 12/28/2017  . 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 Chronic pain of right knee 08/10/2016  . Right ankle pain 08/10/2016  . 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  . 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[place]) 12/11/2015  . Urinary retention 09/08/2015  . Lymphoma,  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[place]) 04/03/2015  . Breathlessness on exertion 11/21/2014  . Breath shortness 11/21/2014  . Arthropathy 11/07/2014  . Atrial flutter, paroxysmal ([place]) 11/07/2014  . Type 2 diabetes mellitus ([place]) 11/07/2014  . Benign essential tremor 11/07/2014  . Benign essential HTN 11/07/2014  . Mixed incontinence 11/07/2014  . Hypercholesterolemia without hypertriglyceridemia 11/07/2014  . Apnea, sleep 11/07/2014  . Controlled type 2 diabetes mellitus without complication ([place]) 08/65/7846  . Pure hypercholesterolemia 11/07/2014  . Other abnormalities of gait and mobility 11/02/2011  . Decreased mobility 11/02/2011  . Abnormal gait 06/29/2011  .  Discoordination 06/29/2011   Pura Spice, PT, DPT # [phone] 09/16/2019, 6:03 AM  [facility] [address] [place], [place], [address] Phone: [phone]   Fax:  [phone]  Name: Zavon Hyson MRN: [id number] Date of Birth: 05-01-1941

## 2019-09-14 ENCOUNTER — Ambulatory Visit: Payer: Medicare Other | Admitting: Urology

## 2019-09-14 ENCOUNTER — Encounter: Payer: Medicare Other | Admitting: Physical Therapy

## 2019-09-18 ENCOUNTER — Encounter: Payer: Self-pay | Admitting: Physical Therapy

## 2019-09-18 ENCOUNTER — Ambulatory Visit: Payer: Medicare Other | Admitting: Physical Therapy

## 2019-09-18 ENCOUNTER — Encounter: Payer: Medicare Other | Admitting: Physical Therapy

## 2019-09-18 ENCOUNTER — Other Ambulatory Visit: Payer: Self-pay

## 2019-09-18 DIAGNOSIS — R293 Abnormal posture: Secondary | ICD-10-CM

## 2019-09-18 DIAGNOSIS — R269 Unspecified abnormalities of gait and mobility: Secondary | ICD-10-CM

## 2019-09-18 DIAGNOSIS — M6281 Muscle weakness (generalized): Secondary | ICD-10-CM | POA: Diagnosis not present

## 2019-09-18 NOTE — Therapy (Signed)
[place] [facility] [address] [place], [place], [address] Phone: [phone]   Fax:  [phone]  Physical Therapy Treatment  Patient Details  Name: Mike Holloway MRN: [id number] Date of Birth: 11-23-1940 Referring Provider (PT): Dr. Rudene Christians   Encounter Date: 09/18/2019  PT End of Session - 09/18/19 1916    Visit Number  35    Number of Visits  41    Date for PT Re-Evaluation  10/04/19    Authorization - Visit Number  3    Authorization - Number of Visits  10    PT Start Time  8453    PT Stop Time  1450    PT Time Calculation (min)  67 min    Equipment Utilized During Treatment  Gait belt    Activity Tolerance  Patient tolerated treatment well;Patient limited by pain    Behavior During Therapy  [facility] for tasks assessed/performed       Past Medical History:  Diagnosis Date  . Arthritis   . Atrial flutter ([place])   . Diabetes mellitus ([place])   . Essential tremor   . Essential tremor    deep brain stimulator   . Hypercholesteremia   . Hypertension   . Incontinence   . 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 Sleep apnea   . Stroke [facility])     Past Surgical History:  Procedure Laterality Date  . ABLATION    . APPENDECTOMY    . CATARACT EXTRACTION, BILATERAL    . COLONOSCOPY WITH PROPOFOL N/A 03/17/2018   Procedure: COLONOSCOPY WITH PROPOFOL;  Surgeon: Jonathon Bellows, MD;  Location: [facility] ENDOSCOPY;  Service: Gastroenterology;  Laterality: N/A;  . DEEP BRAIN STIMULATOR PLACEMENT    . 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 HEMORRHOID SURGERY    . HERNIA REPAIR    . HIP FRACTURE SURGERY    . INTRAMEDULLARY (IM) NAIL INTERTROCHANTERIC Right 09/16/2018   Procedure: INTRAMEDULLARY (IM) NAIL INTERTROCHANTRIC;  Surgeon: Hessie Knows, MD;  Location: [facility] ORS;  Service: Orthopedics;  Laterality:  Right;  . IR CATHETER TUBE CHANGE  11/22/2018  . NASAL SINUS SURGERY    . ORCHIECTOMY    . TONSILLECTOMY      There were no vitals filed for this visit.  Subjective Assessment - 09/18/19 1456    Subjective  Pt. saw Dr. Rudene Christians yesterday and received an injection from Dr. Mack Guise in R hip.  MD feels R knee pain is a result of a referring pain pattern from R hip.    Patient is accompained by:  Family member    Pertinent History  Pt. states R knee is bothering him but ready to work with PT.  Pts. wife states that pt. is walking more at home.    Limitations  Lifting;Standing;Walking;House hold activities    Patient Stated Goals  Improve LE strength/ gait and balance with daily tasks.  Improve gait/ safety/ progression to least assistive device.    Currently in Pain?  Yes    Pain Score  3     Pain Location  Knee    Pain Orientation  Right    Pain Descriptors / Indicators  Aching         There.ex.:  Walking in //-bars with high marching/lateral walking. Seated LAQ/ hip flexion/ abduction 20x.  No ankle wts. Nustep L510 min. B  LE.Moist heat to low back.  Neuro:  Sit to stands from green chair- working on standing technique.  Transfers to chair  without rollator working on increasing step pattern/ length.  No UE assist and pt. Fearful of R knee pain  Walking in //-bars posture correction/ mirror feedback. Cuing for heel strike.    Gait training:  Amb. In clinic with rollator working on increasing BOS/ hip flexion/ step pattern.  Pt. Requires cuing to increase recip. Step pattern/ heel strike.  No increase c/o R knee pain.  Amb. With rollator over thresholds/ carpet with cuing to increase hip flexion    PT Long Term Goals - 09/09/19 0931      PT LONG TERM GOAL #1   Title  Pt. independent with HEP to increase B hip flexion/ R quad muscle strength 1/2 muscle grade to improve pain-free mobility.    Baseline  R knee extension limited to 4/5 MMT secondary to pain/ fear  of pain.    Time  4    Period  Weeks    Status  Partially Met    Target Date  10/04/19      PT LONG TERM GOAL #2   Title  Pt. will increase Berg balance test to >40 out of 56 to improve independence with gait/ decrease fall risk.    Baseline  Berg: 22/56 (significant fall risk); 9/30 32/56.  11/10: 32 (limited by R knee pain)    Time  4    Period  Weeks    Status  Not Met    Target Date  10/04/19      PT LONG TERM GOAL #3   Title  Pt. able to ambulate 100 feet with consistent 2 point gait pattern and use of least restrictive assistive devce to improve household mobility.    Baseline  Pt. able to ambulate with use of RW/ CGA to min. A for safety.   Heavy use of B UE.; 9/30 pt able to ambulate with rollator with inconsistent 2 point gait pattern, heavy reliance on UE CGA/supervision    Time  4    Period  Weeks    Status  Partially Met    Target Date  10/04/19      PT LONG TERM GOAL #4   Title  Pt. able to stand from normal chair with no UE assist to improve safety/independence with transfers.     Baseline  Unable to stand from standard chair without heavy UE assist. 10/6, pt unable to stand from standard chair without UE support.  11/10: benefits from 1 UE assist    Time  4    Period  Weeks    Status  Partially Met    Target Date  10/04/19          Plan - 09/18/19 1917    Clinical Impression Statement  Pt. demonstrates improved ability to stand from 18 inch chair with less UE assist.  Pt. able to stand on first attempt today.  Pt. remains limited/ guarded with transfers without UE assist/ rollator due to R knee pain or fear of pain.  PT did not increase any resistance or prolonged standing tasks due to recent injection yesterday.  Pt. instructed to continue with HEP and walking program this weekend.    Stability/Clinical Decision Making  Evolving/Moderate complexity    Clinical Decision Making  Moderate    Rehab Potential  Good    PT Frequency  2x / week    PT Duration  4  weeks     PT Treatment/Interventions  ADLs/Self Care Home Management;Therapeutic activities;Functional mobility training;Stair training;Gait training;Therapeutic exercise;Balance training;Neuromuscular re-education;Patient/family education;Manual techniques;Passive range of motion    PT Next Visit Plan  LE strengthening, standing/ walking endurance, weight shifting.  Check R knee pain/ call with Dr. Rudene Christians    PT Home Exercise Plan  [facility]    Consulted and Agree with Plan of Care  Patient;Family member/caregiver    Family Member Consulted  spouse: [name]       Patient will benefit from skilled therapeutic intervention in order to improve the following deficits and impairments:  Abnormal gait, Decreased balance, Decreased endurance, Decreased mobility, Difficulty walking, Decreased range of motion, Decreased activity tolerance, Decreased strength, Impaired flexibility, Postural dysfunction, Pain  Visit Diagnosis: Muscle weakness (generalized)  Gait difficulty  Abnormal posture     Problem List Patient Active Problem List   Diagnosis Date Noted  . Lymphedema 04/15/2019  . Bilateral leg edema 01/25/2019  . Hyponatremia 12/24/2018  . SIADH (syndrome of inappropriate ADH production) ([place]) 12/24/2018  . Erosion of urethra due to catheterization of urinary tract ([place]) 10/27/2018  . Generalized weakness 10/14/2018  . Hip fracture ([place]) 09/15/2018  . 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 HLD (hyperlipidemia) 03/24/2018  . Contusion of right knee 02/14/2018  . Bradycardia 12/28/2017  . 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 Chronic pain of right knee 08/10/2016  . Right ankle pain 08/10/2016  . 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  . 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[place]) 12/11/2015  . Urinary retention 09/08/2015  . Lymphoma, 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[place]) 04/03/2015  . Breathlessness on  exertion 11/21/2014  . Breath shortness 11/21/2014  . Arthropathy 11/07/2014  . Atrial flutter, paroxysmal ([place]) 11/07/2014  . Type 2 diabetes mellitus ([place]) 11/07/2014  . Benign essential tremor 11/07/2014  . Benign essential HTN 11/07/2014  . Mixed incontinence 11/07/2014  . Hypercholesterolemia without hypertriglyceridemia 11/07/2014  . Apnea, sleep 11/07/2014  . Controlled type 2 diabetes mellitus without complication ([place]) 74/04/1447  . Pure hypercholesterolemia 11/07/2014  . Other abnormalities of gait and mobility 11/02/2011  . Decreased mobility 11/02/2011  . Abnormal gait 06/29/2011  . Discoordination 06/29/2011   Pura Spice, PT, DPT # [phone] 09/18/2019, 7:22 PM  [facility] [address] [place], [place], [address] Phone: [phone]   Fax:  [phone]  Name: Mike Holloway MRN: [id number] Date of Birth: April 24, 1941

## 2019-09-25 ENCOUNTER — Telehealth: Payer: Self-pay | Admitting: Urology

## 2019-09-25 ENCOUNTER — Other Ambulatory Visit: Payer: Self-pay

## 2019-09-25 ENCOUNTER — Ambulatory Visit: Payer: Medicare Other | Admitting: Physical Therapy

## 2019-09-25 ENCOUNTER — Encounter: Payer: Self-pay | Admitting: Physical Therapy

## 2019-09-25 DIAGNOSIS — R269 Unspecified abnormalities of gait and mobility: Secondary | ICD-10-CM

## 2019-09-25 DIAGNOSIS — R293 Abnormal posture: Secondary | ICD-10-CM

## 2019-09-25 DIAGNOSIS — M6281 Muscle weakness (generalized): Secondary | ICD-10-CM

## 2019-09-25 NOTE — Telephone Encounter (Signed)
Call patient regarding urodynamic study results and left message on voicemail.

## 2019-09-25 NOTE — Therapy (Signed)
[facility] [address]. [place], [place], [address] Phone: [phone]   Fax:  [phone]  Physical Therapy Treatment  Patient Details  Name: Mike Holloway MRN: [id number] Date of Birth: 03/03/1941 Referring Provider (PT): Dr. Rudene Christians   Encounter Date: 09/25/2019  PT End of Session - 09/25/19 1308    Visit Number  36    Number of Visits  41    Date for PT Re-Evaluation  10/04/19    Authorization - Visit Number  4    Authorization - Number of Visits  10    PT Start Time  1300    PT Stop Time  1410    PT Time Calculation (min)  70 min    Equipment Utilized During Treatment  Gait belt    Activity Tolerance  Patient tolerated treatment well    Behavior During Therapy  [facility] for tasks assessed/performed       Past Medical History:  Diagnosis Date  . Arthritis   . Atrial flutter ([place])   . Diabetes mellitus ([place])   . Essential tremor   . Essential tremor    deep brain stimulator   . Hypercholesteremia   . Hypertension   . Incontinence   . 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[place])    grew on the testical  . Sleep apnea   . Stroke [facility])     Past Surgical History:  Procedure Laterality Date  . ABLATION    . APPENDECTOMY    . CATARACT EXTRACTION, BILATERAL    . COLONOSCOPY WITH PROPOFOL N/A 03/17/2018   Procedure: COLONOSCOPY WITH PROPOFOL;  Surgeon: Jonathon Bellows, MD;  Location: [facility] ENDOSCOPY;  Service: Gastroenterology;  Laterality: N/A;  . DEEP BRAIN STIMULATOR PLACEMENT    . 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 HEMORRHOID SURGERY    . HERNIA REPAIR    . HIP FRACTURE SURGERY    . INTRAMEDULLARY (IM) NAIL INTERTROCHANTERIC Right 09/16/2018   Procedure: INTRAMEDULLARY (IM) NAIL INTERTROCHANTRIC;  Surgeon: Hessie Knows, MD;  Location: [facility] ORS;  Service: Orthopedics;  Laterality: Right;  . IR CATHETER TUBE  CHANGE  11/22/2018  . NASAL SINUS SURGERY    . ORCHIECTOMY    . TONSILLECTOMY      There were no vitals filed for this visit.  Subjective Assessment - 09/25/19 1306    Subjective  Patient notes that he had a nice holiday. He states that his knee and back are both feeling okay today (1 and 0 /10 pain respectively). Patient's wife excited to report that while at the podiatrist, the patient was able to independently stand from his transport chair and transfer to the seating surface in the office without assistance and including navigating a narrow turn.    Patient is accompained by:  Family member    Pertinent History  Pt. states R knee is bothering him but ready to work with PT.  Pts. wife states that pt. is walking more at home.    Limitations  Lifting;Standing;Walking;House hold activities    Patient Stated Goals  Improve LE strength/ gait and balance with daily tasks.  Improve gait/ safety/ progression to least assistive device.    Currently in Pain?  Yes    Pain Score  1     Pain Location  Knee    Pain Orientation  Right    Pain Descriptors / Indicators  Aching  TREATMENT  Neuromuscular Re-education: STS from progressively lower heights x5 at 24" x3 at 21" x3 at 20" x3 at 19" x3 at 18" x3 at 17.5" Patient performing with BUE support for transfer initiation, but able to maintain standing independently (SBA/CGA). Patient not demonstrating any TKE leverage at seat nor other gross compensations. Min-Mod VCs for erect posture and using hips to drive transfer.  Walking on variable surface with hidden obstacles, // bars. BUE support fading to open hand contact with bars. No LOB. SBA/CGA. Min-Mod VCs for erect posture.  Standing balance with fishing activity, no UE support, SBA/CGA, 1 LOB with uncontrolled descent to seat. Patient demonstrating hip strategy to maintain balance with increased reaching.   Therapeutic Exercise: Nustep L4, seat 12,10 min. BLE VCs for pacing to  encourage steady work state for improved overall endurance. Seated gluteal sets, 5 sec hold x20 to increase neural drive to hip region prior to practicing STS  Treatments unbilled: MHP to lumbar spine while strengthening on Nu-Step.   Patient educated throughout session on appropriate technique and form using multi-modal cueing, HEP, and activity modification. Patient articulated understanding and returned demonstration.  Patient Response to interventions: Knee pain 2/10, Back pain 1-2/10. Patient reporting mild enthusiasm with ease of standing from lower surface.   ASSESSMENT Patient presents to clinic with excellent motivation to participate in therapy. Patient demonstrates deficits in BLE strength, balance, balance confidence, and functional independence. Patient able to achieve 20 STS from progressively lower surfaces (24" to 17.5") during today's session and responded positively to neuromuscular re-education interventions. Patient continues to require rest breaks of moderate length between activities to prevent fatigue. Patient will benefit from continued skilled therapeutic intervention to address remaining deficits in BLE strength, balance, balance confidence, and functional independence in order to decrease risk of falls, increase function, and improve overall QOL.       PT Long Term Goals - 09/09/19 0931      PT LONG TERM GOAL #1   Title  Pt. independent with HEP to increase B hip flexion/ R quad muscle strength 1/2 muscle grade to improve pain-free mobility.    Baseline  R knee extension limited to 4/5 MMT secondary to pain/ fear of pain.    Time  4    Period  Weeks    Status  Partially Met    Target Date  10/04/19      PT LONG TERM GOAL #2   Title  Pt. will increase Berg balance test to >40 out of 56 to improve independence with gait/ decrease fall risk.    Baseline  Berg: 22/56 (significant fall risk); 9/30 32/56.  11/10: 32 (limited by R knee pain)    Time  4    Period   Weeks    Status  Not Met    Target Date  10/04/19      PT LONG TERM GOAL #3   Title  Pt. able to ambulate 100 feet with consistent 2 point gait pattern and use of least restrictive assistive devce to improve household mobility.    Baseline  Pt. able to ambulate with use of RW/ CGA to min. A for safety.   Heavy use of B UE.; 9/30 pt able to ambulate with rollator with inconsistent 2 point gait pattern, heavy reliance on UE CGA/supervision    Time  4    Period  Weeks    Status  Partially Met    Target Date  10/04/19      PT LONG TERM  GOAL #4   Title  Pt. able to stand from normal chair with no UE assist to improve safety/independence with transfers.     Baseline  Unable to stand from standard chair without heavy UE assist. 10/6, pt unable to stand from standard chair without UE support.  11/10: benefits from 1 UE assist    Time  4    Period  Weeks    Status  Partially Met    Target Date  10/04/19            Plan - 09/25/19 1310    Clinical Impression Statement  Patient presents to clinic with excellent motivation to participate in therapy. Patient demonstrates deficits in BLE strength, balance, balance confidence, and functional independence. Patient able to achieve 20 STS from progressively lower surfaces (24" to 17.5") during today's session and responded positively to neuromuscular re-education interventions. Patient continues to require rest breaks of moderate length between activities to prevent fatigue. Patient will benefit from continued skilled therapeutic intervention to address remaining deficits in BLE strength, balance, balance confidence, and functional independence in order to decrease risk of falls, increase function, and improve overall QOL.    Stability/Clinical Decision Making  Evolving/Moderate complexity    Rehab Potential  Good    PT Frequency  2x / week    PT Duration  4 weeks    PT Treatment/Interventions  ADLs/Self Care Home Management;Therapeutic  activities;Functional mobility training;Stair training;Gait training;Therapeutic exercise;Balance training;Neuromuscular re-education;Patient/family education;Manual techniques;Passive range of motion    PT Next Visit Plan  LE strengthening, standing/ walking endurance, weight shifting.  Check R knee pain/ call with Dr. Rudene Christians    PT Home Exercise Plan  [facility]    Consulted and Agree with Plan of Care  Patient;Family member/caregiver    Family Member Consulted  spouse: [name]       Patient will benefit from skilled therapeutic intervention in order to improve the following deficits and impairments:  Abnormal gait, Decreased balance, Decreased endurance, Decreased mobility, Difficulty walking, Decreased range of motion, Decreased activity tolerance, Decreased strength, Impaired flexibility, Postural dysfunction, Pain  Visit Diagnosis: Muscle weakness (generalized)  Gait difficulty  Abnormal posture     Problem List Patient Active Problem List   Diagnosis Date Noted  . Lymphedema 04/15/2019  . Bilateral leg edema 01/25/2019  . Hyponatremia 12/24/2018  . SIADH (syndrome of inappropriate ADH production) ([place]) 12/24/2018  . Erosion of urethra due to catheterization of urinary tract ([place]) 10/27/2018  . Generalized weakness 10/14/2018  . Hip fracture ([place]) 09/15/2018  . 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 HLD (hyperlipidemia) 03/24/2018  . Contusion of right knee 02/14/2018  . Bradycardia 12/28/2017  . 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 Chronic pain of right knee 08/10/2016  . Right ankle pain 08/10/2016  . 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  . 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[place]) 12/11/2015  . Urinary retention 09/08/2015  . Lymphoma, 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[place]) 04/03/2015  . Breathlessness on exertion 11/21/2014  . Breath shortness 11/21/2014  . Arthropathy  11/07/2014  . Atrial flutter, paroxysmal ([place]) 11/07/2014  . Type 2 diabetes mellitus ([place]) 11/07/2014  . Benign essential tremor 11/07/2014  . Benign essential HTN 11/07/2014  . Mixed incontinence 11/07/2014  . Hypercholesterolemia without hypertriglyceridemia 11/07/2014  . Apnea, sleep 11/07/2014  . Controlled type 2 diabetes mellitus without complication ([place]) 76/28/3151  . Pure hypercholesterolemia 11/07/2014  . Other abnormalities of gait and mobility 11/02/2011  . Decreased mobility  11/02/2011  . Abnormal gait 06/29/2011  . Discoordination 06/29/2011   Myles Gip PT, DPT [phone] 09/25/2019, 2:55 PM  [place] [facility] [address] [place], [place], [address] Phone: [phone]   Fax:  [phone]  Name: Mike Holloway MRN: [id number] Date of Birth: 20-Aug-1941

## 2019-09-26 NOTE — Telephone Encounter (Signed)
Patient's wife returned the call and said if you were able to call back today fine, if not they will wait to hear from you next week. I explained that you were out of the office for the rest of the week.   Sharyn Lull

## 2019-09-27 ENCOUNTER — Encounter: Payer: Self-pay | Admitting: Physical Therapy

## 2019-09-27 ENCOUNTER — Other Ambulatory Visit: Payer: Self-pay

## 2019-09-27 ENCOUNTER — Ambulatory Visit: Payer: Medicare Other | Admitting: Physical Therapy

## 2019-09-27 DIAGNOSIS — R269 Unspecified abnormalities of gait and mobility: Secondary | ICD-10-CM

## 2019-09-27 DIAGNOSIS — M6281 Muscle weakness (generalized): Secondary | ICD-10-CM

## 2019-09-27 DIAGNOSIS — R293 Abnormal posture: Secondary | ICD-10-CM

## 2019-09-27 NOTE — Therapy (Signed)
[facility] [address] [place], [place], [address] Phone: [phone]   Fax:  [phone]  Physical Therapy Treatment  Patient Details  Name: Mike Holloway MRN: [id number] Date of Birth: Dec 08, 1940 Referring Provider (PT): Dr. Rudene Christians   Encounter Date: 09/27/2019  PT End of Session - 09/27/19 1827    Visit Number  37    Number of Visits  41    Date for PT Re-Evaluation  10/04/19    Authorization - Visit Number  5    Authorization - Number of Visits  10    PT Start Time  1344    PT Stop Time  1440    PT Time Calculation (min)  56 min    Equipment Utilized During Treatment  Gait belt    Activity Tolerance  Patient tolerated treatment well;Patient limited by pain    Behavior During Therapy  [facility] for tasks assessed/performed       Past Medical History:  Diagnosis Date  . Arthritis   . Atrial flutter ([place])   . Diabetes mellitus ([place])   . Essential tremor   . Essential tremor    deep brain stimulator   . Hypercholesteremia   . Hypertension   . Incontinence   . 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 Sleep apnea   . Stroke [facility])     Past Surgical History:  Procedure Laterality Date  . ABLATION    . APPENDECTOMY    . CATARACT EXTRACTION, BILATERAL    . COLONOSCOPY WITH PROPOFOL N/A 03/17/2018   Procedure: COLONOSCOPY WITH PROPOFOL;  Surgeon: Jonathon Bellows, MD;  Location: [facility] ENDOSCOPY;  Service: Gastroenterology;  Laterality: N/A;  . DEEP BRAIN STIMULATOR PLACEMENT    . 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 HEMORRHOID SURGERY    . HERNIA REPAIR    . HIP FRACTURE SURGERY    . INTRAMEDULLARY (IM) NAIL INTERTROCHANTERIC Right 09/16/2018   Procedure: INTRAMEDULLARY (IM) NAIL INTERTROCHANTRIC;  Surgeon: Hessie Knows, MD;  Location: [facility] ORS;  Service: Orthopedics;  Laterality:  Right;  . IR CATHETER TUBE CHANGE  11/22/2018  . NASAL SINUS SURGERY    . ORCHIECTOMY    . TONSILLECTOMY      There were no vitals filed for this visit.  Subjective Assessment - 09/27/19 1814    Subjective  Pt. states he has not heard back from Dr. Rudene Christians.  Pt. states R knee discomfort is present with standing/ walking.    Patient is accompained by:  Family member    Pertinent History  Pt. states R knee is bothering him but ready to work with PT.  Pts. wife states that pt. is walking more at home.    Limitations  Lifting;Standing;Walking;House hold activities    Patient Stated Goals  Improve LE strength/ gait and balance with daily tasks.  Improve gait/ safety/ progression to least assistive device.    Currently in Pain?  Yes    Pain Score  2     Pain Location  Knee    Pain Orientation  Right        There.ex.:  Walking in //-bars with high marching/lateral walking. Seated LAQ/ hip flexion/ abduction 20x.  No ankle wts. Nustep L510 min. B LE.Moist heat to low back. Standing heel raises 20x (cuing to correct posture).   Neuro:  Sit to stands from  blue mat table- working on standing technique. Static standing: B UE wand ex. (chest press/ shoulder flexion 20x each)- pt. Complete >5 minutes of standing with no UE assist.   Walking in //-bars posture correction/ mirror feedback. Cuing for heel strike.   Walking in //-bars with cone taps 2x (mirror feedback).      PT Long Term Goals - 09/09/19 0931      PT LONG TERM GOAL #1   Title  Pt. independent with HEP to increase B hip flexion/ R quad muscle strength 1/2 muscle grade to improve pain-free mobility.    Baseline  R knee extension limited to 4/5 MMT secondary to pain/ fear of pain.    Time  4    Period  Weeks    Status  Partially Met    Target Date  10/04/19      PT LONG TERM GOAL #2   Title  Pt. will increase Berg balance test to >40 out of 56 to improve independence with gait/ decrease fall risk.     Baseline  Berg: 22/56 (significant fall risk); 9/30 32/56.  11/10: 32 (limited by R knee pain)    Time  4    Period  Weeks    Status  Not Met    Target Date  10/04/19      PT LONG TERM GOAL #3   Title  Pt. able to ambulate 100 feet with consistent 2 point gait pattern and use of least restrictive assistive devce to improve household mobility.    Baseline  Pt. able to ambulate with use of RW/ CGA to min. A for safety.   Heavy use of B UE.; 9/30 pt able to ambulate with rollator with inconsistent 2 point gait pattern, heavy reliance on UE CGA/supervision    Time  4    Period  Weeks    Status  Partially Met    Target Date  10/04/19      PT LONG TERM GOAL #4   Title  Pt. able to stand from normal chair with no UE assist to improve safety/independence with transfers.     Baseline  Unable to stand from standard chair without heavy UE assist. 10/6, pt unable to stand from standard chair without UE support.  11/10: benefits from 1 UE assist    Time  4    Period  Weeks    Status  Partially Met    Target Date  10/04/19         Plan - 09/27/19 1828    Clinical Impression Statement  Pt. requires several seated rest breaks t/o tx. esp. after standing ther.ex./ walking.  Pt. limited by increase low back/ R knee pain with wt. bearing ex.  Pt. did complete 5 min. 12 sec. of static standing during UE wand ex. before required seated break.  No change to HEP and pt. encouraged to increase walking distance/ time at home over weekend.    Stability/Clinical Decision Making  Evolving/Moderate complexity    Clinical Decision Making  Moderate    Rehab Potential  Good    PT Frequency  2x / week    PT Duration  4 weeks    PT Treatment/Interventions  ADLs/Self Care Home Management;Therapeutic activities;Functional mobility training;Stair training;Gait training;Therapeutic exercise;Balance training;Neuromuscular re-education;Patient/family education;Manual techniques;Passive range of motion    PT Next Visit  Plan  LE strengthening, standing/ walking endurance, weight shifting.  Check R knee pain/ call with Dr. Rudene Christians    PT Home Exercise Plan  [facility]  Consulted and Agree with Plan of Care  Patient;Family member/caregiver    Family Member Consulted  spouse: [name]       Patient will benefit from skilled therapeutic intervention in order to improve the following deficits and impairments:  Abnormal gait, Decreased balance, Decreased endurance, Decreased mobility, Difficulty walking, Decreased range of motion, Decreased activity tolerance, Decreased strength, Impaired flexibility, Postural dysfunction, Pain  Visit Diagnosis: Muscle weakness (generalized)  Gait difficulty  Abnormal posture     Problem List Patient Active Problem List   Diagnosis Date Noted  . Lymphedema 04/15/2019  . Bilateral leg edema 01/25/2019  . Hyponatremia 12/24/2018  . 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 Erosion of urethra due to catheterization of urinary tract ([place]) 10/27/2018  . Generalized weakness 10/14/2018  . Hip fracture ([place]) 09/15/2018  . 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 HLD (hyperlipidemia) 03/24/2018  . Contusion of right knee 02/14/2018  . Bradycardia 12/28/2017  . 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 Chronic pain of right knee 08/10/2016  . Right ankle pain 08/10/2016  . 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  . 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[place]) 12/11/2015  . Urinary retention 09/08/2015  . Lymphoma, 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[place]) 04/03/2015  . Breathlessness on exertion 11/21/2014  . Breath shortness 11/21/2014  . Arthropathy 11/07/2014  . Atrial flutter, paroxysmal ([place]) 11/07/2014  . Type 2 diabetes mellitus ([place]) 11/07/2014  . Benign essential tremor 11/07/2014  . Benign essential HTN 11/07/2014  . Mixed incontinence 11/07/2014  .  Hypercholesterolemia without hypertriglyceridemia 11/07/2014  . Apnea, sleep 11/07/2014  . Controlled type 2 diabetes mellitus without complication ([place]) 29/56/2130  . Pure hypercholesterolemia 11/07/2014  . Other abnormalities of gait and mobility 11/02/2011  . Decreased mobility 11/02/2011  . Abnormal gait 06/29/2011  . Discoordination 06/29/2011   Pura Spice, PT, DPT # [phone] 09/27/2019, 6:36 PM  [place] [facility] [address] [place], [place], [address] Phone: [phone]   Fax:  [phone]  Name: Rondey Fallen MRN: [id number] Date of Birth: [birth year]

## 2019-10-02 ENCOUNTER — Other Ambulatory Visit: Payer: Self-pay

## 2019-10-02 ENCOUNTER — Encounter: Payer: Self-pay | Admitting: Physical Therapy

## 2019-10-02 ENCOUNTER — Ambulatory Visit: Payer: Medicare Other | Attending: Orthopedic Surgery | Admitting: Physical Therapy

## 2019-10-02 DIAGNOSIS — M6281 Muscle weakness (generalized): Secondary | ICD-10-CM

## 2019-10-02 DIAGNOSIS — R269 Unspecified abnormalities of gait and mobility: Secondary | ICD-10-CM | POA: Diagnosis present

## 2019-10-02 DIAGNOSIS — R2689 Other abnormalities of gait and mobility: Secondary | ICD-10-CM | POA: Diagnosis present

## 2019-10-02 DIAGNOSIS — R293 Abnormal posture: Secondary | ICD-10-CM | POA: Diagnosis present

## 2019-10-02 NOTE — Therapy (Signed)
[place] [facility] [address]. [place], [place], [address] Phone: [phone]   Fax:  [phone]  Physical Therapy Treatment  Patient Details  Name: Mike Holloway MRN: [id number] Date of Birth: 11-20-1940 Referring Provider (PT): Dr. Rudene Christians   Encounter Date: 10/02/2019  PT End of Session - 10/02/19 1322    Visit Number  38    Number of Visits  41    Date for PT Re-Evaluation  10/04/19    Authorization - Visit Number  6    Authorization - Number of Visits  10    PT Start Time  1300    PT Stop Time  1410    PT Time Calculation (min)  70 min    Equipment Utilized During Treatment  Gait belt    Activity Tolerance  Patient tolerated treatment well;Patient limited by pain;Patient limited by fatigue    Behavior During Therapy  [facility] for tasks assessed/performed       Past Medical History:  Diagnosis Date  . Arthritis   . Atrial flutter ([place])   . Diabetes mellitus ([place])   . Essential tremor   . Essential tremor    deep brain stimulator   . Hypercholesteremia   . Hypertension   . Incontinence   . 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[place])    grew on the testical  . Sleep apnea   . Stroke [facility])     Past Surgical History:  Procedure Laterality Date  . ABLATION    . APPENDECTOMY    . CATARACT EXTRACTION, BILATERAL    . COLONOSCOPY WITH PROPOFOL N/A 03/17/2018   Procedure: COLONOSCOPY WITH PROPOFOL;  Surgeon: Jonathon Bellows, MD;  Location: [facility] ENDOSCOPY;  Service: Gastroenterology;  Laterality: N/A;  . DEEP BRAIN STIMULATOR PLACEMENT    . 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 HEMORRHOID SURGERY    . HERNIA REPAIR    . HIP FRACTURE SURGERY    . INTRAMEDULLARY (IM) NAIL INTERTROCHANTERIC Right 09/16/2018   Procedure: INTRAMEDULLARY (IM) NAIL INTERTROCHANTRIC;  Surgeon: Hessie Knows, MD;  Location: [facility] ORS;  Service:  Orthopedics;  Laterality: Right;  . IR CATHETER TUBE CHANGE  11/22/2018  . NASAL SINUS SURGERY    . ORCHIECTOMY    . TONSILLECTOMY      There were no vitals filed for this visit.  Subjective Assessment - 10/02/19 1451    Subjective  Patient reports no significant changes since his last visit. He denies any new onset of pain, but notes he is at his usual pain level for his knees, R hip, and lumbar region. Patient's spouse is eager for him to continue working on rising from a low seating surface for improved ability to transfer on to and off of the toilet.    Patient is accompained by:  Family member    Pertinent History  Pt. states R knee is bothering him but ready to work with PT.  Pts. wife states that pt. is walking more at home.    Limitations  Lifting;Standing;Walking;House hold activities    Patient Stated Goals  Improve LE strength/ gait and balance with daily tasks.  Improve gait/ safety/ progression to least assistive device.    Currently in Pain?  Yes    Pain Score  3     Pain Location  Knee    Pain Orientation  Right    Pain Descriptors / Indicators  Aching       TREATMENT  Neuromuscular Re-education: Seated hip hinge for improved body mechanics and glute dominant strategy with STS from low surface, x20  STS from progressively lower heights x5 at 23" x4 at 21" x4 at 19" x4 at 17" Patient performing with BUE support for transfer initiation, but able to maintain standing independently (SBA/CGA). Patient not demonstrating any TKE leverage at seat nor other gross compensations. Min VCs for erect posture and using hips to drive transfer.  Forward and backward walking in // bars with faded UE support, several laps. SBA/CGA. Patient able to fade to LUE support only. Patient has more fluid movement with forward walking LUE support, but had no LOB with retro-walking.   Standing balance with fishing activity, no UE support, SBA/CGA, no LOB. Patient maintaining balance with erect  posture and UE reaches without loss of balance.   Patient and caregiver education on benefits of preparatory exercises (hip hinge, glute sets) to improve success with transfers from lower surfaces, as well as benefits to strengthening PFM to decrease incidence of bowel/bladder incontinence.   Therapeutic Exercise: Nustep L5, seat 12,10 min. BLE VCs for pacing to encourage steady work state for improved overall endurance. Seated gluteal sets, 3 sec hold x30 to increase neural drive to hip region prior to practicing STS  Treatments unbilled: MHP to lumbar spine while strengthening on Nu-Step.   Patient educated throughout session on appropriate technique and form using multi-modal cueing, HEP, and activity modification. Patient articulated understanding and returned demonstration.  Patient Response to interventions: Patient expressing mild pleasure with ability to walk in // bars with LUE support only.  ASSESSMENT Patient presents to clinic with excellent motivation to participate in therapy. Patient demonstrates deficits in BLE strength, balance, balance confidence, and functional independence. Patient able to ambulate with LUE support in parallel bars forward and backward during today's session and responded positively to neuromuscular re-education interventions. Patient continues to require rest breaks of moderate length between activities to prevent fatigue, but likely could tolerate greater density of activity if required. Patient will benefit from continued skilled therapeutic intervention to address remaining deficits in BLE strength, balance, balance confidence, and functional independence in order to decrease risk of falls, increase function, and improve overall QOL.         PT Long Term Goals - 09/09/19 0931      PT LONG TERM GOAL #1   Title  Pt. independent with HEP to increase B hip flexion/ R quad muscle strength 1/2 muscle grade to improve pain-free mobility.    Baseline  R  knee extension limited to 4/5 MMT secondary to pain/ fear of pain.    Time  4    Period  Weeks    Status  Partially Met    Target Date  10/04/19      PT LONG TERM GOAL #2   Title  Pt. will increase Berg balance test to >40 out of 56 to improve independence with gait/ decrease fall risk.    Baseline  Berg: 22/56 (significant fall risk); 9/30 32/56.  11/10: 32 (limited by R knee pain)    Time  4    Period  Weeks    Status  Not Met    Target Date  10/04/19      PT LONG TERM GOAL #3   Title  Pt. able to ambulate 100 feet with consistent 2 point gait pattern and use of least restrictive assistive devce to improve household mobility.  Baseline  Pt. able to ambulate with use of RW/ CGA to min. A for safety.   Heavy use of B UE.; 9/30 pt able to ambulate with rollator with inconsistent 2 point gait pattern, heavy reliance on UE CGA/supervision    Time  4    Period  Weeks    Status  Partially Met    Target Date  10/04/19      PT LONG TERM GOAL #4   Title  Pt. able to stand from normal chair with no UE assist to improve safety/independence with transfers.     Baseline  Unable to stand from standard chair without heavy UE assist. 10/6, pt unable to stand from standard chair without UE support.  11/10: benefits from 1 UE assist    Time  4    Period  Weeks    Status  Partially Met    Target Date  10/04/19            Plan - 10/02/19 1322    Clinical Impression Statement  Patient presents to clinic with excellent motivation to participate in therapy. Patient demonstrates deficits in BLE strength, balance, balance confidence, and functional independence. Patient able to ambulate with LUE support in parallel bars forward and backward during today's session and responded positively to neuromuscular re-education interventions. Patient continues to require rest breaks of moderate length between activities to prevent fatigue, but likely could tolerate greater density of activity if required.  Patient will benefit from continued skilled therapeutic intervention to address remaining deficits in BLE strength, balance, balance confidence, and functional independence in order to decrease risk of falls, increase function, and improve overall QOL.    Stability/Clinical Decision Making  Evolving/Moderate complexity    Rehab Potential  Good    PT Frequency  2x / week    PT Duration  4 weeks    PT Treatment/Interventions  ADLs/Self Care Home Management;Therapeutic activities;Functional mobility training;Stair training;Gait training;Therapeutic exercise;Balance training;Neuromuscular re-education;Patient/family education;Manual techniques;Passive range of motion    PT Next Visit Plan  LE strengthening, standing/ walking endurance, weight shifting.  Check R knee pain/ call with Dr. Rudene Christians    PT Home Exercise Plan  [facility]    Consulted and Agree with Plan of Care  Patient;Family member/caregiver    Family Member Consulted  spouse: [name]       Patient will benefit from skilled therapeutic intervention in order to improve the following deficits and impairments:  Abnormal gait, Decreased balance, Decreased endurance, Decreased mobility, Difficulty walking, Decreased range of motion, Decreased activity tolerance, Decreased strength, Impaired flexibility, Postural dysfunction, Pain  Visit Diagnosis: Muscle weakness (generalized)  Gait difficulty  Abnormal posture     Problem List Patient Active Problem List   Diagnosis Date Noted  . Lymphedema 04/15/2019  . Bilateral leg edema 01/25/2019  . Hyponatremia 12/24/2018  . SIADH (syndrome of inappropriate ADH production) ([place]) 12/24/2018  . Erosion of urethra due to catheterization of urinary tract ([place]) 10/27/2018  . Generalized weakness 10/14/2018  . Hip fracture ([place]) 09/15/2018  . 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 HLD (hyperlipidemia)  03/24/2018  . Contusion of right knee 02/14/2018  . Bradycardia 12/28/2017  . 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 Chronic pain of right knee 08/10/2016  . Right ankle pain 08/10/2016  . 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  . 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[place]) 12/11/2015  . Urinary retention 09/08/2015  . Lymphoma, 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[place]) 04/03/2015  .  Breathlessness on exertion 11/21/2014  . Breath shortness 11/21/2014  . Arthropathy 11/07/2014  . Atrial flutter, paroxysmal ([place]) 11/07/2014  . Type 2 diabetes mellitus ([place]) 11/07/2014  . Benign essential tremor 11/07/2014  . Benign essential HTN 11/07/2014  . Mixed incontinence 11/07/2014  . Hypercholesterolemia without hypertriglyceridemia 11/07/2014  . Apnea, sleep 11/07/2014  . Controlled type 2 diabetes mellitus without complication ([place]) 60/73/7106  . Pure hypercholesterolemia 11/07/2014  . Other abnormalities of gait and mobility 11/02/2011  . Decreased mobility 11/02/2011  . Abnormal gait 06/29/2011  . Discoordination 06/29/2011   Myles Gip PT, DPT [phone] 10/02/2019, 3:08 PM  [place] [facility] [address] [place], [place], [address] Phone: [phone]   Fax:  [phone]  Name: Fredrich Cory MRN: [id number] Date of Birth: Apr 01, 1941

## 2019-10-04 ENCOUNTER — Encounter: Payer: Self-pay | Admitting: Physical Therapy

## 2019-10-04 ENCOUNTER — Ambulatory Visit: Payer: Medicare Other | Admitting: Physical Therapy

## 2019-10-04 ENCOUNTER — Other Ambulatory Visit: Payer: Self-pay

## 2019-10-04 DIAGNOSIS — R293 Abnormal posture: Secondary | ICD-10-CM

## 2019-10-04 DIAGNOSIS — M6281 Muscle weakness (generalized): Secondary | ICD-10-CM | POA: Diagnosis not present

## 2019-10-04 DIAGNOSIS — R269 Unspecified abnormalities of gait and mobility: Secondary | ICD-10-CM

## 2019-10-04 NOTE — Therapy (Signed)
[place] [facility] [address] [place], [place], [address] Phone: [phone]   Fax:  [phone]  Physical Therapy Treatment  Patient Details  Name: Mike Holloway MRN: [id number] Date of Birth: [birth year] Referring Provider (PT): Dr. Rudene Christians   Encounter Date: 10/04/2019  PT End of Session - 10/04/19 1412    Visit Number  39    Number of Visits  41    Date for PT Re-Evaluation  10/04/19    Authorization - Visit Number  7    Authorization - Number of Visits  10    PT Start Time  1301    PT Stop Time  1411    PT Time Calculation (min)  70 min    Equipment Utilized During Treatment  Gait belt    Activity Tolerance  Patient tolerated treatment well;Patient limited by pain;Patient limited by fatigue    Behavior During Therapy  [facility] for tasks assessed/performed       Past Medical History:  Diagnosis Date  . Arthritis   . Atrial flutter ([place])   . Diabetes mellitus ([place])   . Essential tremor   . Essential tremor    deep brain stimulator   . Hypercholesteremia   . Hypertension   . Incontinence   . 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[place])    grew on the testical  . Sleep apnea   . Stroke [facility])     Past Surgical History:  Procedure Laterality Date  . ABLATION    . APPENDECTOMY    . CATARACT EXTRACTION, BILATERAL    . COLONOSCOPY WITH PROPOFOL N/A 03/17/2018   Procedure: COLONOSCOPY WITH PROPOFOL;  Surgeon: Jonathon Bellows, MD;  Location: [facility] ENDOSCOPY;  Service: Gastroenterology;  Laterality: N/A;  . DEEP BRAIN STIMULATOR PLACEMENT    . 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 HEMORRHOID SURGERY    . HERNIA REPAIR    . HIP FRACTURE SURGERY    . INTRAMEDULLARY (IM) NAIL INTERTROCHANTERIC Right 09/16/2018   Procedure: INTRAMEDULLARY (IM) NAIL INTERTROCHANTRIC;  Surgeon: Hessie Knows, MD;  Location: [facility] ORS;  Service:  Orthopedics;  Laterality: Right;  . IR CATHETER TUBE CHANGE  11/22/2018  . NASAL SINUS SURGERY    . ORCHIECTOMY    . TONSILLECTOMY      There were no vitals filed for this visit.  Subjective Assessment - 10/04/19 1304    Subjective  Pt. reports no signficant changes from last visit. Pt. reports soreness in his low back and R knee from increased activity after last tx and was relieved with MH.    Patient is accompained by:  Family member    Pertinent History  Pt. states R knee is bothering him but it is the typical discomfort he experiences    Limitations  Lifting;Standing;Walking;[place] hold activities    Patient Stated Goals  Improve LE strength/ gait and balance with daily tasks.  Improve gait/ safety/ progression to least assistive device.    Currently in Pain?  Yes   Pt. reports typical discomfort in R knee but no objective number given   Pain Location  Knee   Pt. reports LBP during exercises   Pain Orientation  Right    Pain Descriptors / Indicators  Aching    Pain Type  Chronic pain    Pain Onset  More than a month ago    Pain Frequency  Intermittent  TREATMENT  Neuromuscular Re-education:  Seated hip hinge for improved body mechanics and glute dominant strategy with STS from low surface, x20  STS from progressively lower heights x2 at 21" x2 at 19" x4 at 17"  Pt was able to perform consecutive repetitions with decreased rest time between sets.   Standing functional reach and balance fishing activity 2x: Pt did not use UE support and experienced no LOB.    Therapeutic Exercise:  Nustep L4, seat 12,10 min. Pt decreased intensity level from 5 to 4.  Seated gluteal sets, 3 sec hold x30 to increase neural drive to hip region prior to practicing STS Seated Marching with 3 sec holds X 10 alternating Seated LAQ with 3 sec holds X 10 alternating  Seated UE punches, flexion, and arms behind head 10x each Seated Overhead press with [place] Bar  Treatments  unbilled: MHP to lumbar spine while on Nu-Step.  Patient Response to interventions: Patient expressing pleasure with standing balance and functional reach fishing activity     PT Long Term Goals - 09/09/19 0931      PT LONG TERM GOAL #1   Title  Pt. independent with HEP to increase B hip flexion/ R quad muscle strength 1/2 muscle grade to improve pain-free mobility.    Baseline  R knee extension limited to 4/5 MMT secondary to pain/ fear of pain.    Time  4    Period  Weeks    Status  Partially Met    Target Date  10/04/19      PT LONG TERM GOAL #2   Title  Pt. will increase Berg balance test to >40 out of 56 to improve independence with gait/ decrease fall risk.    Baseline  Berg: 22/56 (significant fall risk); 9/30 32/56.  11/10: 32 (limited by R knee pain)    Time  4    Period  Weeks    Status  Not Met    Target Date  10/04/19      PT LONG TERM GOAL #3   Title  Pt. able to ambulate 100 feet with consistent 2 point gait pattern and use of least restrictive assistive devce to improve household mobility.    Baseline  Pt. able to ambulate with use of RW/ CGA to min. A for safety.   Heavy use of B UE.; 9/30 pt able to ambulate with rollator with inconsistent 2 point gait pattern, heavy reliance on UE CGA/supervision    Time  4    Period  Weeks    Status  Partially Met    Target Date  10/04/19      PT LONG TERM GOAL #4   Title  Pt. able to stand from normal chair with no UE assist to improve safety/independence with transfers.     Baseline  Unable to stand from standard chair without heavy UE assist. 10/6, pt unable to stand from standard chair without UE support.  11/10: benefits from 1 UE assist    Time  4    Period  Weeks    Status  Partially Met    Target Date  10/04/19         Plan - 10/04/19 1415    Clinical Impression Statement  Pt. demonstrated fatigue and LBP and knee discomfort with prolonged standing and sit to stand exercises. Pt. was able to stand for 1.5  min at blue mat table at beginning of tx. before needing a rest break due to reports of LBP.  During  rest breaks, pt. demonstrated B UE AROM (functional with flexion) and strength. Pt. demonstrated ability to maintain upright posture with standing marching and weight shifting with decreased hand support (SBA from SPT for safety).  Pt. showed seated marching and LAQ exercises with eccentric control.  Pt. will benefit from further functional activities to improve ability to toilet independently.    Examination-Activity Limitations  Toileting    Stability/Clinical Decision Making  Evolving/Moderate complexity    Clinical Decision Making  Moderate    Rehab Potential  Poor    PT Frequency  2x / week    PT Duration  4 weeks    PT Treatment/Interventions  Moist Heat;Functional mobility training;Therapeutic activities;Therapeutic exercise;Balance training    PT Next Visit Plan  Increase standing/walking endurance, LE strengthening. Can add ankle weights for seated hip flexion exercises.  REASSESS GOAL/ RECERT    PT Home Exercise Plan  [email or web address]    Consulted and Agree with Plan of Care  Patient;Family member/caregiver    Family Member Consulted  spouse: [name]       Patient will benefit from skilled therapeutic intervention in order to improve the following deficits and impairments:  Abnormal gait, Decreased endurance, Decreased activity tolerance, Pain, Decreased balance, Impaired flexibility, Decreased strength, Postural dysfunction  Visit Diagnosis: Muscle weakness (generalized)  Gait difficulty  Abnormal posture     Problem List Patient Active Problem List   Diagnosis Date Noted  . Lymphedema 04/15/2019  . Bilateral leg edema 01/25/2019  . Hyponatremia 12/24/2018  . 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 Erosion of urethra due to catheterization of urinary tract ([place]) 10/27/2018  . Generalized weakness 10/14/2018  . Hip fracture ([place]) 09/15/2018  . Moderate  mitral insufficiency 08/16/2018  . 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 HLD (hyperlipidemia) 03/24/2018  . Contusion of right knee 02/14/2018  . Bradycardia 12/28/2017  . 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 Chronic pain of right knee 08/10/2016  . Right ankle pain 08/10/2016  . 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  . 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[place]) 12/11/2015  . Urinary retention 09/08/2015  . Lymphoma, 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[place]) 04/03/2015  . Breathlessness on exertion 11/21/2014  . Breath shortness 11/21/2014  . Arthropathy 11/07/2014  . Atrial flutter, paroxysmal ([place]) 11/07/2014  . Type 2 diabetes mellitus ([place]) 11/07/2014  . Benign essential tremor 11/07/2014  . Benign essential HTN 11/07/2014  . Mixed incontinence 11/07/2014  . Hypercholesterolemia without hypertriglyceridemia 11/07/2014  . Apnea, sleep 11/07/2014  . Controlled type 2 diabetes mellitus without complication ([place]) 20/80/2233  . Pure hypercholesterolemia 11/07/2014  . Other abnormalities of gait and mobility 11/02/2011  . Decreased mobility 11/02/2011  . Abnormal gait 06/29/2011  . Discoordination 06/29/2011   Pura Spice, PT, DPT # [id number] Andrey Campanile, SPT 10/04/2019, 2:52 PM  [place] [facility] [address] [place], [place], [address] Phone: [phone]   Fax:  [phone]  Name: Mike Holloway MRN: [id number] Date of Birth: Aug 12, 1941

## 2019-10-08 ENCOUNTER — Telehealth: Payer: Self-pay | Admitting: Urology

## 2019-10-08 NOTE — Telephone Encounter (Signed)
Pt's wife called and wants to discuss UDS results and also discuss follow up plan.

## 2019-10-09 ENCOUNTER — Encounter: Payer: Self-pay | Admitting: Physical Therapy

## 2019-10-09 ENCOUNTER — Other Ambulatory Visit: Payer: Self-pay

## 2019-10-09 ENCOUNTER — Ambulatory Visit: Payer: Medicare Other | Admitting: Physical Therapy

## 2019-10-09 DIAGNOSIS — R269 Unspecified abnormalities of gait and mobility: Secondary | ICD-10-CM

## 2019-10-09 DIAGNOSIS — R293 Abnormal posture: Secondary | ICD-10-CM

## 2019-10-09 DIAGNOSIS — M6281 Muscle weakness (generalized): Secondary | ICD-10-CM | POA: Diagnosis not present

## 2019-10-09 NOTE — Therapy (Signed)
[facility] [address] [place], [place], [address] Phone: [phone]   Fax:  [phone]  Physical Therapy Treatment  Patient Details  Name: Mike Holloway MRN: [id number] Date of Birth: 12-08-1940 Referring Provider (PT): Dr. Rudene Christians   Encounter Date: 10/09/2019    Treatment: 43 of 16.  Recert date: 7/0/1779 3903 to 52   Past Medical History:  Diagnosis Date  . Arthritis   . Atrial flutter ([place])   . Diabetes mellitus ([place])   . Essential tremor   . Essential tremor    deep brain stimulator   . Hypercholesteremia   . Hypertension   . Incontinence   . 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[place])    grew on the testical  . Sleep apnea   . Stroke [facility])     Past Surgical History:  Procedure Laterality Date  . ABLATION    . APPENDECTOMY    . CATARACT EXTRACTION, BILATERAL    . COLONOSCOPY WITH PROPOFOL N/A 03/17/2018   Procedure: COLONOSCOPY WITH PROPOFOL;  Surgeon: Jonathon Bellows, MD;  Location: [facility] ENDOSCOPY;  Service: Gastroenterology;  Laterality: N/A;  . DEEP BRAIN STIMULATOR PLACEMENT    . 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 HEMORRHOID SURGERY    . HERNIA REPAIR    . HIP FRACTURE SURGERY    . INTRAMEDULLARY (IM) NAIL INTERTROCHANTERIC Right 09/16/2018   Procedure: INTRAMEDULLARY (IM) NAIL INTERTROCHANTRIC;  Surgeon: Hessie Knows, MD;  Location: [facility] ORS;  Service: Orthopedics;  Laterality: Right;  . IR CATHETER TUBE CHANGE  11/22/2018  . NASAL SINUS SURGERY    . ORCHIECTOMY    . TONSILLECTOMY      There were no vitals filed for this visit.  Pt. reports soreness in his R knee and "ouch" in his R lower back prior to today's treatment. Pt. reports that the heat at the end of the session help to reduce discomfort in his R lower back.     Objective  There.ex.:  Seated Horizontal Abduction with  YTB 20x  Standing Walking Marching in //-bars 3 x down and back (cuing for posture correction/ mirror feedback)- SBA/CGA for safety.  Seated B UE Diagonal Pulls YTB 20x  Seated Scapular Retractions YTB 20x  Overhead press with weighted bar 10 reps 2 sets NuStep L5 10 min. With B UE/LE with MH to low back (end of tx. Session).    Neuro.:  Standing Side Stepping with Cone taps in //-bars x 4 min Sit to Stands: 21" 10x and 19.5" 5 reps x 2  Standing Double Leg Balance (no hands) on airex pad in //-bars 30 sec x 2  Walking in PT clinic with cuing to increase upright posture/ recip. Step length/ pattern with consistent heel strike.        PT Long Term Goals - 10/11/19 0826      PT LONG TERM GOAL #1   Title  Pt. independent with HEP to increase B hip flexion/ R quad muscle strength 1/2 muscle grade to improve pain-free mobility.    Baseline  R knee extension limited to 4/5 MMT secondary to pain/ fear of pain.    Time  4    Period  Weeks    Status  Partially Met    Target Date  11/06/19      PT LONG TERM GOAL #2   Title  Pt. will increase Berg balance test  to >40 out of 56 to improve independence with gait/ decrease fall risk.    Baseline  Berg: 22/56 (significant fall risk); 9/30 32/56.  11/10: 32 (limited by R knee pain)    Time  4    Period  Weeks    Status  Not Met    Target Date  11/06/19      PT LONG TERM GOAL #3   Title  Pt. able to ambulate 100 feet with consistent 2 point gait pattern and use of least restrictive assistive devce to improve household mobility.    Baseline  Pt. able to ambulate with use of RW/ CGA to min. A for safety.   Heavy use of B UE.; 9/30 pt able to ambulate with rollator with inconsistent 2 point gait pattern, heavy reliance on UE CGA/supervision    Time  4    Period  Weeks    Status  Partially Met    Target Date  11/06/19      PT LONG TERM GOAL #4   Title  Pt. able to stand from normal chair with no UE assist to improve safety/independence with  transfers.     Baseline  Unable to stand from standard chair without heavy UE assist. 10/6, pt unable to stand from standard chair without UE support.  11/10: benefits from 1 UE assist    Time  4    Period  Weeks    Status  Partially Met    Target Date  11/06/19         Pt. tolerated new standing side stepping exercises in the //-bars and required SBA and bilateral UE support. Pt. is limited due to fatigue in B LE and R low back. Pt. was able to continuously stand for 5 minutes in //-bars before needing a break in the beginning of tx. Pt. requires frequent breaks but tolerates UE exercises during rest periods. Pt. will continue to benefit from skilled PT to increase B LE endurance/strengthening and balance activities to improve independence.      Patient will benefit from skilled therapeutic intervention in order to improve the following deficits and impairments:  Abnormal gait, Decreased endurance, Decreased activity tolerance, Pain, Decreased balance, Impaired flexibility, Decreased strength, Postural dysfunction  Visit Diagnosis: Muscle weakness (generalized)  Gait difficulty  Abnormal posture     Problem List Patient Active Problem List   Diagnosis Date Noted  . Lymphedema 04/15/2019  . Bilateral leg edema 01/25/2019  . Hyponatremia 12/24/2018  . SIADH (syndrome of inappropriate ADH production) ([place]) 12/24/2018  . Erosion of urethra due to catheterization of urinary tract ([place]) 10/27/2018  . Generalized weakness 10/14/2018  . Hip fracture ([place]) 09/15/2018  . 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 HLD (hyperlipidemia) 03/24/2018  . Contusion of right knee 02/14/2018  . Bradycardia 12/28/2017  . 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 Chronic pain of right knee 08/10/2016  . Right ankle pain 08/10/2016  . 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   . 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[place]) 12/11/2015  . Urinary retention 09/08/2015  . Lymphoma, 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[place]) 04/03/2015  . Breathlessness on exertion 11/21/2014  . Breath shortness 11/21/2014  . Arthropathy 11/07/2014  . Atrial flutter, paroxysmal ([place]) 11/07/2014  . Type 2 diabetes mellitus ([place]) 11/07/2014  . Benign essential tremor 11/07/2014  . Benign essential HTN 11/07/2014  . Mixed incontinence 11/07/2014  . Hypercholesterolemia without hypertriglyceridemia 11/07/2014  . Apnea, sleep 11/07/2014  . Controlled  type 2 diabetes mellitus without complication ([place]) 31/49/7026  . Pure hypercholesterolemia 11/07/2014  . Other abnormalities of gait and mobility 11/02/2011  . Decreased mobility 11/02/2011  . Abnormal gait 06/29/2011  . Discoordination 06/29/2011   Pura Spice, PT, DPT # [id number] 10/11/2019, 12:05 PM  [facility] [address] [place], [place], [address] Phone: [phone]   Fax:  [phone]  Name: Mike Holloway MRN: [id number] Date of Birth: September 30, 1940

## 2019-10-09 NOTE — Telephone Encounter (Signed)
Pt returned your call.  

## 2019-10-09 NOTE — Telephone Encounter (Signed)
Left voice mail

## 2019-10-10 MED ORDER — MIRABEGRON ER 50 MG PO TB24: 50.0000 mg | ORAL_TABLET | Freq: Every day | ORAL | 1 refills | Status: DC

## 2019-10-10 NOTE — Telephone Encounter (Signed)
I contacted Mike Holloway regarding Mr. Mike Holloway urodynamic study.  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  Last residual [place] was approximately 250 mL.  Mike Holloway states Mike Holloway was told during the study that there was no evidence of obstruction.  Options were discussed of doing nothing and monitoring his kidney function however he does have significant incontinence.  He had urethral erosion with a Foley catheter and this is not a good option.  Mike Holloway would not be able to perform intermittent catheterization.  We discussed the only viable option for bladder drainage would be to replace the SP tube.  He is currently off Myrbetriq and Mike Holloway thought he did better while on this medication.  He is on tamsulosin also.  I did send in an Rx for Myrbetriq which Mike Holloway will try for another month.  Mike Holloway was informed to call back if Mike Holloway desires to schedule SP tube placement.

## 2019-10-11 ENCOUNTER — Other Ambulatory Visit: Payer: Self-pay

## 2019-10-11 ENCOUNTER — Ambulatory Visit: Payer: Medicare Other | Admitting: Physical Therapy

## 2019-10-11 DIAGNOSIS — R293 Abnormal posture: Secondary | ICD-10-CM

## 2019-10-11 DIAGNOSIS — M6281 Muscle weakness (generalized): Secondary | ICD-10-CM

## 2019-10-11 DIAGNOSIS — R269 Unspecified abnormalities of gait and mobility: Secondary | ICD-10-CM

## 2019-10-11 NOTE — Therapy (Signed)
[facility] [address] [place], [place], [address] Phone: [phone]   Fax:  [phone]  Physical Therapy Treatment  Patient Details  Name: Mike Holloway MRN: [id number] Date of Birth: March 16, 1941 Referring Provider (PT): Dr. Rudene Christians   Encounter Date: 10/11/2019  PT End of Session - 10/11/19 1308    Visit Number  41    Number of Visits  48    Date for PT Re-Evaluation  11/06/19    Authorization - Visit Number  2    Authorization - Number of Visits  10    PT Start Time  9233    PT Stop Time  1429    PT Time Calculation (min)  85 min    Equipment Utilized During Treatment  Gait belt    Activity Tolerance  Patient tolerated treatment well;Patient limited by pain;Patient limited by fatigue    Behavior During Therapy  [facility] for tasks assessed/performed       Past Medical History:  Diagnosis Date  . Arthritis   . Atrial flutter ([place])   . Diabetes mellitus ([place])   . Essential tremor   . Essential tremor    deep brain stimulator   . Hypercholesteremia   . Hypertension   . Incontinence   . 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 Sleep apnea   . Stroke [facility])     Past Surgical History:  Procedure Laterality Date  . ABLATION    . APPENDECTOMY    . CATARACT EXTRACTION, BILATERAL    . COLONOSCOPY WITH PROPOFOL N/A 03/17/2018   Procedure: COLONOSCOPY WITH PROPOFOL;  Surgeon: Jonathon Bellows, MD;  Location: [facility] ENDOSCOPY;  Service: Gastroenterology;  Laterality: N/A;  . DEEP BRAIN STIMULATOR PLACEMENT    . 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 HEMORRHOID SURGERY    . HERNIA REPAIR    . HIP FRACTURE SURGERY    . INTRAMEDULLARY (IM) NAIL INTERTROCHANTERIC Right 09/16/2018   Procedure: INTRAMEDULLARY (IM) NAIL INTERTROCHANTRIC;  Surgeon: Hessie Knows, MD;  Location: [facility] ORS;  Service:  Orthopedics;  Laterality: Right;  . IR CATHETER TUBE CHANGE  11/22/2018  . NASAL SINUS SURGERY    . ORCHIECTOMY    . TONSILLECTOMY      There were no vitals filed for this visit.  Subjective Assessment - 10/11/19 1303    Subjective  Pt. reports a constant 1/10 L knee pain and his low back pain is okay before tx. Pt. reports that he does not feel very good about his balance. No recent falls reported. Pt's wife reports that he will be getting a suprapubic catheter put in soon.    Patient is accompained by:  Family member    Pertinent History  Pt. states R knee is bothering him but it is the typical discomfort he experiences    Limitations  Lifting;Standing;Walking;House hold activities    Patient Stated Goals  Improve LE strength/ gait and balance with daily tasks.  Improve gait/ safety/ progression to least assistive device.    Currently in Pain?  Yes    Pain Score  1     Pain Location  Knee    Pain Orientation  Right    Pain Descriptors / Indicators  Aching    Pain Type  Chronic pain    Pain Onset  More than a month ago  Pain Frequency  Constant    Multiple Pain Sites  No    Pain Onset  More than a month ago      Objective  TheraEx NuStep 10'  Seat height 12, level 4, bilat LE only Supine TrA activation 5 sec holds x 5  Supine Marching with TrA activation 3 sec holds x 10 bilat TRX Squats to chair with 1 blue foam cushion 2 x 10   Seated hip flexion alternating 2 x 10  Seated hip abd GTB with manual resistance 2 x 15    Manual Therapy Supine R Hip Distraction  Supine R proximal hamstring stretch (limited to 45 degs of hip flexion) Supine R Hip flexion stretch  Supine R Hip ER stretch  Pt. Struggled with rolling towards the right side from supine to sit  Neuromuscular Re-education Walking Marching in //-bars with blue mat and weights underneath to challenge balance 3 laps Cone Tapping in //-bars 3 laps (pt still requires heavy UE involvement)   Pt. Requires cueing for  postural corrections during gait activities    PT Long Term Goals - 10/11/19 0826      PT LONG TERM GOAL #1   Title  Pt. independent with HEP to increase B hip flexion/ R quad muscle strength 1/2 muscle grade to improve pain-free mobility.    Baseline  R knee extension limited to 4/5 MMT secondary to pain/ fear of pain.    Time  4    Period  Weeks    Status  Partially Met    Target Date  11/06/19      PT LONG TERM GOAL #2   Title  Pt. will increase Berg balance test to >40 out of 56 to improve independence with gait/ decrease fall risk.    Baseline  Berg: 22/56 (significant fall risk); 9/30 32/56.  11/10: 32 (limited by R knee pain)    Time  4    Period  Weeks    Status  Not Met    Target Date  11/06/19      PT LONG TERM GOAL #3   Title  Pt. able to ambulate 100 feet with consistent 2 point gait pattern and use of least restrictive assistive devce to improve household mobility.    Baseline  Pt. able to ambulate with use of RW/ CGA to min. A for safety.   Heavy use of B UE.; 9/30 pt able to ambulate with rollator with inconsistent 2 point gait pattern, heavy reliance on UE CGA/supervision    Time  4    Period  Weeks    Status  Partially Met    Target Date  11/06/19      PT LONG TERM GOAL #4   Title  Pt. able to stand from normal chair with no UE assist to improve safety/independence with transfers.     Baseline  Unable to stand from standard chair without heavy UE assist. 10/6, pt unable to stand from standard chair without UE support.  11/10: benefits from 1 UE assist    Time  4    Period  Weeks    Status  Partially Met    Target Date  11/06/19            Plan - 10/11/19 1431    Clinical Impression Statement  Pt. had pain at his lower back when transfer from sitting to supine on blue mat table, the symptom eases after he stay in supine position for 1 min. Pt required verbal  cueing for activating TrA during supine marching. Pt. was able to perform sit to stand with TRX  with proper technique and SBA at 21.5 inches chair (including the height of airex). Pt. was concern about spraining his ankles during forward walking on blue mat with ankle weights underneath the mat within //-bars with SBA. Pt. had to use two hands for maintaining his balance during forward marching and cone tapping with SBA within //-bars. Pt. responded well to manual distraction at his R hip. Pt. will continue to benefit from skilled PT to increase B LE strengthening and balance activities to decrease fall risks and increase independence.    Examination-Activity Limitations  Toileting    Stability/Clinical Decision Making  Evolving/Moderate complexity    Clinical Decision Making  Moderate    Rehab Potential  Poor    PT Frequency  2x / week    PT Duration  4 weeks    PT Treatment/Interventions  Moist Heat;Functional mobility training;Therapeutic activities;Therapeutic exercise;Balance training;Patient/family education;Manual techniques;Passive range of motion;Neuromuscular re-education;Stair training;Gait training;ADLs/Self Care Home Management    PT Next Visit Plan  Increase standing/walking endurance, LE strengthening.    PT Home Exercise Plan  [email or web address]    Consulted and Agree with Plan of Care  Patient;Family member/caregiver    Family Member Consulted  spouse: [name]       Patient will benefit from skilled therapeutic intervention in order to improve the following deficits and impairments:  Abnormal gait, Decreased endurance, Decreased activity tolerance, Pain, Decreased balance, Impaired flexibility, Decreased strength, Postural dysfunction  Visit Diagnosis: Muscle weakness (generalized)  Gait difficulty  Abnormal posture     Problem List Patient Active Problem List   Diagnosis Date Noted  . Lymphedema 04/15/2019  . Bilateral leg edema 01/25/2019  . Hyponatremia 12/24/2018  . SIADH (syndrome of inappropriate ADH production) ([place]) 12/24/2018  . Erosion of urethra due to  catheterization of urinary tract ([place]) 10/27/2018  . Generalized weakness 10/14/2018  . Hip fracture ([place]) 09/15/2018  . 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 HLD (hyperlipidemia) 03/24/2018  . Contusion of right knee 02/14/2018  . Bradycardia 12/28/2017  . 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 Chronic pain of right knee 08/10/2016  . Right ankle pain 08/10/2016  . 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  . 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[place]) 12/11/2015  . Urinary retention 09/08/2015  . Lymphoma, 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[place]) 04/03/2015  . Breathlessness on exertion 11/21/2014  . Breath shortness 11/21/2014  . Arthropathy 11/07/2014  . Atrial flutter, paroxysmal ([place]) 11/07/2014  . Type 2 diabetes mellitus ([place]) 11/07/2014  . Benign essential tremor 11/07/2014  . Benign essential HTN 11/07/2014  . Mixed incontinence 11/07/2014  . Hypercholesterolemia without hypertriglyceridemia 11/07/2014  . Apnea, sleep 11/07/2014  . Controlled type 2 diabetes mellitus without complication ([place]) 64/33/2951  . Pure hypercholesterolemia 11/07/2014  . Other abnormalities of gait and mobility 11/02/2011  . Decreased mobility 11/02/2011  . Abnormal gait 06/29/2011  . Discoordination 06/29/2011   Pura Spice, PT, DPT # [id number] Andrey Campanile, SPT 10/12/2019, 8:14 AM  [facility] [address] [place], [place], [address] Phone: [phone]   Fax:  [phone]  Name: Koleton Duchemin MRN: [id number] Date of Birth: 1941/03/05

## 2019-10-15 ENCOUNTER — Encounter: Payer: Self-pay | Admitting: Physical Therapy

## 2019-10-15 ENCOUNTER — Other Ambulatory Visit: Payer: Self-pay

## 2019-10-15 ENCOUNTER — Ambulatory Visit: Payer: Medicare Other | Admitting: Physical Therapy

## 2019-10-15 DIAGNOSIS — M6281 Muscle weakness (generalized): Secondary | ICD-10-CM

## 2019-10-15 DIAGNOSIS — R269 Unspecified abnormalities of gait and mobility: Secondary | ICD-10-CM

## 2019-10-15 DIAGNOSIS — R2689 Other abnormalities of gait and mobility: Secondary | ICD-10-CM

## 2019-10-15 NOTE — Therapy (Signed)
[facility] [address] [place], [place], [address] Phone: [phone]   Fax:  [phone]  Physical Therapy Treatment  Patient Details  Name: Mike Holloway MRN: [id number] Date of Birth: 11-30-1940 Referring Provider (PT): Dr. Rudene Christians   Encounter Date: 10/15/2019  PT End of Session - 10/15/19 1446    Visit Number  42    Number of Visits  48    Date for PT Re-Evaluation  11/06/19    Authorization - Visit Number  3    Authorization - Number of Visits  10    PT Start Time  5374    PT Stop Time  8270    PT Time Calculation (min)  96 min    Equipment Utilized During Treatment  Gait belt    Activity Tolerance  Patient tolerated treatment well;Patient limited by pain;Patient limited by fatigue    Behavior During Therapy  [facility] for tasks assessed/performed       Past Medical History:  Diagnosis Date  . Arthritis   . Atrial flutter ([place])   . Diabetes mellitus ([place])   . Essential tremor   . Essential tremor    deep brain stimulator   . Hypercholesteremia   . Hypertension   . Incontinence   . 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[place])    grew on the testical  . Sleep apnea   . Stroke [facility])     Past Surgical History:  Procedure Laterality Date  . ABLATION    . APPENDECTOMY    . CATARACT EXTRACTION, BILATERAL    . COLONOSCOPY WITH PROPOFOL N/A 03/17/2018   Procedure: COLONOSCOPY WITH PROPOFOL;  Surgeon: Jonathon Bellows, MD;  Location: [facility] ENDOSCOPY;  Service: Gastroenterology;  Laterality: N/A;  . DEEP BRAIN STIMULATOR PLACEMENT    . 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 HEMORRHOID SURGERY    . HERNIA REPAIR    . HIP FRACTURE SURGERY    . INTRAMEDULLARY (IM) NAIL INTERTROCHANTERIC Right 09/16/2018   Procedure: INTRAMEDULLARY (IM) NAIL INTERTROCHANTRIC;  Surgeon: Hessie Knows, MD;  Location: [facility] ORS;  Service:  Orthopedics;  Laterality: Right;  . IR CATHETER TUBE CHANGE  11/22/2018  . NASAL SINUS SURGERY    . ORCHIECTOMY    . TONSILLECTOMY      There were no vitals filed for this visit.  Subjective Assessment - 10/15/19 1443    Subjective  Pt. reports 1-2/10 soreness in his R knee prior to today's session. No recent falls reported.    Patient is accompained by:  Family member    Pertinent History  Pt. states R knee is bothering him but it is the typical discomfort he experiences    Limitations  Lifting;Standing;Walking;House hold activities    Patient Stated Goals  Improve LE strength/ gait and balance with daily tasks.  Improve gait/ safety/ progression to least assistive device.    Currently in Pain?  Yes    Pain Score  2     Pain Location  Knee    Pain Orientation  Right    Pain Descriptors / Indicators  Sore    Pain Type  Chronic pain    Pain Onset  More than a month ago    Pain Frequency  Intermittent    Multiple Pain Sites  No    Pain Onset  More than a month ago  Therex:  Nustep: seat 12 L5 x 12 min Sit to stand (chair with no armrests, use one hand to push through a table by his right side for assist), SBA 5 reps x 4 sets YTB shoulder horizontal ADD 10 reps x 1 set  Quad set 10 reps x 1 set each side, long sitting Amb. In hallway/ out to car with use of rollator and SBA/CGA for cuing to increase step length/ upright posture.  Pt. Able to manage slight descend in ramp to enter passenger side of van.  Pt. Requires extra time to maneuver rollator at [place] to back into passenger seat.    Manual Therapy:  Supine calf stretch 60 s x 1 set each side, long sitting Supine hip distraction 30 s x 3 sets each side, long sitting Assist with bed mobility/ scooting up on mat table after LAD.       PT Long Term Goals - 10/11/19 0826      PT LONG TERM GOAL #1   Title  Pt. independent with HEP to increase B hip flexion/ R quad muscle strength 1/2 muscle grade to improve pain-free  mobility.    Baseline  R knee extension limited to 4/5 MMT secondary to pain/ fear of pain.    Time  4    Period  Weeks    Status  Partially Met    Target Date  11/06/19      PT LONG TERM GOAL #2   Title  Pt. will increase Berg balance test to >40 out of 56 to improve independence with gait/ decrease fall risk.    Baseline  Berg: 22/56 (significant fall risk); 9/30 32/56.  11/10: 32 (limited by R knee pain)    Time  4    Period  Weeks    Status  Not Met    Target Date  11/06/19      PT LONG TERM GOAL #3   Title  Pt. able to ambulate 100 feet with consistent 2 point gait pattern and use of least restrictive assistive devce to improve household mobility.    Baseline  Pt. able to ambulate with use of RW/ CGA to min. A for safety.   Heavy use of B UE.; 9/30 pt able to ambulate with rollator with inconsistent 2 point gait pattern, heavy reliance on UE CGA/supervision    Time  4    Period  Weeks    Status  Partially Met    Target Date  11/06/19      PT LONG TERM GOAL #4   Title  Pt. able to stand from normal chair with no UE assist to improve safety/independence with transfers.     Baseline  Unable to stand from standard chair without heavy UE assist. 10/6, pt unable to stand from standard chair without UE support.  11/10: benefits from 1 UE assist    Time  4    Period  Weeks    Status  Partially Met    Target Date  11/06/19         Plan - 10/15/19 1448    Clinical Impression Statement  Pt. requires frequent breaks during sit to stand (chair without armrests and pt. places one hand on a table on his right side for UE assist) exercise. Pt. is able to perform sit to stand use with a table by his right side for UE assist with SBA. Pt. has more positive reponses when SPT adjusts the hand replacements from above knee to above ankle  during manual hip distraction bilaterally. Pts R hip PROM is limited by pain at R knee and L hip PROM is limited by pain at L hip during SLR. Pt. requires verbal  cueing for performing quad set exercise with proper techniques. Pt. will benefit from skilled PT to progress functional/balance exercises to improve his ability to tolieting and other ADLs as well as decreasing fall risks.    Examination-Activity Limitations  Toileting    Stability/Clinical Decision Making  Evolving/Moderate complexity    Clinical Decision Making  Moderate    Rehab Potential  Poor    PT Frequency  2x / week    PT Duration  4 weeks    PT Treatment/Interventions  Moist Heat;Functional mobility training;Therapeutic activities;Therapeutic exercise;Balance training;Patient/family education;Manual techniques;Passive range of motion;Neuromuscular re-education;Stair training;Gait training;ADLs/Self Care Home Management    PT Next Visit Plan  Continue sit to stand at chair without handles to improve his ability to tolieting; balance training to improve LE endurance; decrease break frequency (timing for break)    PT Home Exercise Plan  [facility]    Consulted and Agree with Plan of Care  Patient;Family member/caregiver    Family Member Consulted  spouse: [name]       Patient will benefit from skilled therapeutic intervention in order to improve the following deficits and impairments:  Abnormal gait, Decreased endurance, Decreased activity tolerance, Pain, Decreased balance, Impaired flexibility, Decreased strength, Postural dysfunction  Visit Diagnosis: Muscle weakness (generalized)  Gait difficulty  Balance problem     Problem List Patient Active Problem List   Diagnosis Date Noted  . Lymphedema 04/15/2019  . Bilateral leg edema 01/25/2019  . Hyponatremia 12/24/2018  . SIADH (syndrome of inappropriate ADH production) ([place]) 12/24/2018  . Erosion of urethra due to catheterization of urinary tract ([place]) 10/27/2018  . Generalized weakness 10/14/2018  . Hip fracture ([place]) 09/15/2018  . 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 HLD (hyperlipidemia) 03/24/2018  . Contusion of right knee 02/14/2018  . Bradycardia 12/28/2017  . 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 Chronic pain of right knee 08/10/2016  . Right ankle pain 08/10/2016  . 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  . 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[place]) 12/11/2015  . Urinary retention 09/08/2015  . Lymphoma, 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[place]) 04/03/2015  . Breathlessness on exertion 11/21/2014  . Breath shortness 11/21/2014  . Arthropathy 11/07/2014  . Atrial flutter, paroxysmal ([place]) 11/07/2014  . Type 2 diabetes mellitus ([place]) 11/07/2014  . Benign essential tremor 11/07/2014  . Benign essential HTN 11/07/2014  . Mixed incontinence 11/07/2014  . Hypercholesterolemia without hypertriglyceridemia 11/07/2014  . Apnea, sleep 11/07/2014  . Controlled type 2 diabetes mellitus without complication ([place]) 74/04/1447  . Pure hypercholesterolemia 11/07/2014  . Other abnormalities of gait and mobility 11/02/2011  . Decreased mobility 11/02/2011  . Abnormal gait 06/29/2011  . Discoordination 06/29/2011   Pura Spice, PT, DPT # [place], SPT 10/15/2019, 7:40 PM  [facility] [address] [place], [place], [address] Phone: [phone]   Fax:  [phone]  Name: Mike Holloway MRN: [id number] Date of Birth: 1940/11/25

## 2019-10-18 ENCOUNTER — Ambulatory Visit: Payer: Medicare Other | Admitting: Physical Therapy

## 2019-10-18 ENCOUNTER — Encounter: Payer: Self-pay | Admitting: Physical Therapy

## 2019-10-18 ENCOUNTER — Other Ambulatory Visit: Payer: Self-pay

## 2019-10-18 DIAGNOSIS — R2689 Other abnormalities of gait and mobility: Secondary | ICD-10-CM

## 2019-10-18 DIAGNOSIS — R293 Abnormal posture: Secondary | ICD-10-CM

## 2019-10-18 DIAGNOSIS — R269 Unspecified abnormalities of gait and mobility: Secondary | ICD-10-CM

## 2019-10-18 DIAGNOSIS — M6281 Muscle weakness (generalized): Secondary | ICD-10-CM | POA: Diagnosis not present

## 2019-10-18 NOTE — Therapy (Signed)
[facility] [address] [place], [place], [address] Phone: [phone]   Fax:  [phone]  Physical Therapy Treatment  Patient Details  Name: Mike Holloway MRN: [id number] Date of Birth: 05-03-1941 Referring Provider (PT): Dr. Rudene Christians   Encounter Date: 10/18/2019  PT End of Session - 10/18/19 1444    Visit Number  43    Number of Visits  48    Date for PT Re-Evaluation  11/06/19    Authorization - Visit Number  4    Authorization - Number of Visits  10    PT Start Time  1301    PT Stop Time  1429    PT Time Calculation (min)  88 min    Equipment Utilized During Treatment  Gait belt    Activity Tolerance  Patient tolerated treatment well;Patient limited by pain    Behavior During Therapy  [facility] for tasks assessed/performed       Past Medical History:  Diagnosis Date  . Arthritis   . Atrial flutter ([place])   . Diabetes mellitus ([place])   . Essential tremor   . Essential tremor    deep brain stimulator   . Hypercholesteremia   . Hypertension   . Incontinence   . 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 Sleep apnea   . Stroke [facility])     Past Surgical History:  Procedure Laterality Date  . ABLATION    . APPENDECTOMY    . CATARACT EXTRACTION, BILATERAL    . COLONOSCOPY WITH PROPOFOL N/A 03/17/2018   Procedure: COLONOSCOPY WITH PROPOFOL;  Surgeon: Jonathon Bellows, MD;  Location: [facility] ENDOSCOPY;  Service: Gastroenterology;  Laterality: N/A;  . DEEP BRAIN STIMULATOR PLACEMENT    . 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 HEMORRHOID SURGERY    . HERNIA REPAIR    . HIP FRACTURE SURGERY    . INTRAMEDULLARY (IM) NAIL INTERTROCHANTERIC Right 09/16/2018   Procedure: INTRAMEDULLARY (IM) NAIL INTERTROCHANTRIC;  Surgeon: Hessie Knows, MD;  Location: [facility] ORS;  Service: Orthopedics;  Laterality:  Right;  . IR CATHETER TUBE CHANGE  11/22/2018  . NASAL SINUS SURGERY    . ORCHIECTOMY    . TONSILLECTOMY      There were no vitals filed for this visit.  Subjective Assessment - 10/18/19 1439    Subjective  Pt. reports 3/10 pain in his low back and no increased R knee and R hip pain prior to today's session. No recent falls reported.    Patient is accompained by:  Family member    Pertinent History  Pt. states R knee is bothering him but it is the typical discomfort he experiences    Limitations  House hold activities;Walking;Standing;Lifting    Patient Stated Goals  Improve LE strength/ gait and balance with daily tasks.  Improve gait/ safety/ progression to least assistive device.    Currently in Pain?  Yes    Pain Score  3     Pain Location  Back    Pain Descriptors / Indicators  Sore    Pain Type  Chronic pain    Pain Onset  More than a month ago    Pain Frequency  Intermittent    Multiple Pain Sites  No         Therex:  Nustep L4 10 min.  (B LE only)- consistent cadence  Sit to stand on non-handle chair with a table by his right side to mimic the dinning table at his home, 1 reps x 10 set, 45 s-60 s standing in between sets, CGA Seated hip flexion with light manual resistance 10 reps x 2 sets Reviewed HEP/ shoulder AROM  Neuro:    Forward marching/side stepping/cone tapping within //- bars 3 laps x 1 set, SBA/CGA Standing with cone reaching with one hand x 4 mins, SBA/CGA Walking in PT clinic/ outside to car with use of rollator and cuing to decrease UE assist/ relax UT muscle activation.       PT Long Term Goals - 10/11/19 0826      PT LONG TERM GOAL #1   Title  Pt. independent with HEP to increase B hip flexion/ R quad muscle strength 1/2 muscle grade to improve pain-free mobility.    Baseline  R knee extension limited to 4/5 MMT secondary to pain/ fear of pain.    Time  4    Period  Weeks    Status  Partially Met    Target Date  11/06/19      PT LONG TERM  GOAL #2   Title  Pt. will increase Berg balance test to >40 out of 56 to improve independence with gait/ decrease fall risk.    Baseline  Berg: 22/56 (significant fall risk); 9/30 32/56.  11/10: 32 (limited by R knee pain)    Time  4    Period  Weeks    Status  Not Met    Target Date  11/06/19      PT LONG TERM GOAL #3   Title  Pt. able to ambulate 100 feet with consistent 2 point gait pattern and use of least restrictive assistive devce to improve household mobility.    Baseline  Pt. able to ambulate with use of RW/ CGA to min. A for safety.   Heavy use of B UE.; 9/30 pt able to ambulate with rollator with inconsistent 2 point gait pattern, heavy reliance on UE CGA/supervision    Time  4    Period  Weeks    Status  Partially Met    Target Date  11/06/19      PT LONG TERM GOAL #4   Title  Pt. able to stand from normal chair with no UE assist to improve safety/independence with transfers.     Baseline  Unable to stand from standard chair without heavy UE assist. 10/6, pt unable to stand from standard chair without UE support.  11/10: benefits from 1 UE assist    Time  4    Period  Weeks    Status  Partially Met    Target Date  11/06/19          Plan - 10/18/19 1447    Clinical Impression Statement  Pt. has increased low back pain from 3/10 to 5/10 duirng sit to stands (no armrest green chair with a table by his right side for UE assist) and standing cone reaching within //-bars. Pt. requires rest breaks during standing cone reaching exercise due to increased low back pain but the pain is able to ease after sitting down for 1 min.  Pt. demonstrates improved LE endurance by standing for 45 s-60 s for 10 sets without use of his hands for balance. Pt. requires vebral cueing for balance training within //-bars for posture correction. Pt. demonstrates good heel strike pattern during balance training. Pt. will benefit from skilled PT to  improve LE endurance and strength to improve independence  in ADLs and decrease fall risks.    Examination-Activity Limitations  Toileting;Stand    Stability/Clinical Decision Making  Evolving/Moderate complexity    Clinical Decision Making  Moderate    Rehab Potential  Poor    PT Frequency  2x / week    PT Duration  4 weeks    PT Treatment/Interventions  Moist Heat;Functional mobility training;Therapeutic activities;Therapeutic exercise;Balance training;Patient/family education;Manual techniques;Passive range of motion;Neuromuscular re-education;Stair training;Gait training;ADLs/Self Care Home Management    PT Next Visit Plan  Continue sit to stand at chair without handles to improve pts ability to tolieting; balance training/more standing to improve LE endurance; decrease break frequency (timing for break)    PT Home Exercise Plan  [facility]    Consulted and Agree with Plan of Care  Patient;Family member/caregiver    Family Member Consulted  spouse: [name]       Patient will benefit from skilled therapeutic intervention in order to improve the following deficits and impairments:  Abnormal gait, Decreased endurance, Decreased activity tolerance, Pain, Decreased balance, Impaired flexibility, Decreased strength, Postural dysfunction  Visit Diagnosis: Muscle weakness (generalized)  Gait difficulty  Balance problem  Abnormal posture     Problem List Patient Active Problem List   Diagnosis Date Noted  . Lymphedema 04/15/2019  . Bilateral leg edema 01/25/2019  . Hyponatremia 12/24/2018  . SIADH (syndrome of inappropriate ADH production) ([place]) 12/24/2018  . Erosion of urethra due to catheterization of urinary tract ([place]) 10/27/2018  . Generalized weakness 10/14/2018  . Hip fracture ([place]) 09/15/2018  . 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 HLD (hyperlipidemia) 03/24/2018  . Contusion of right knee 02/14/2018  . Bradycardia 12/28/2017  . Status  post right unicompartmental knee replacement 11/03/2017  . Chronic pain of right knee 08/10/2016  . Right ankle pain 08/10/2016  . 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  . 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[place]) 12/11/2015  . Urinary retention 09/08/2015  . Lymphoma, 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[place]) 04/03/2015  . Breathlessness on exertion 11/21/2014  . Breath shortness 11/21/2014  . Arthropathy 11/07/2014  . Atrial flutter, paroxysmal ([place]) 11/07/2014  . Type 2 diabetes mellitus ([place]) 11/07/2014  . Benign essential tremor 11/07/2014  . Benign essential HTN 11/07/2014  . Mixed incontinence 11/07/2014  . Hypercholesterolemia without hypertriglyceridemia 11/07/2014  . Apnea, sleep 11/07/2014  . Controlled type 2 diabetes mellitus without complication ([place]) 67/08/4579  . Pure hypercholesterolemia 11/07/2014  . Other abnormalities of gait and mobility 11/02/2011  . Decreased mobility 11/02/2011  . Abnormal gait 06/29/2011  . Discoordination 06/29/2011   Pura Spice, PT, DPT # [place], SPT 10/19/2019, 1:03 PM  [facility] [address] [place], [place], [address] Phone: [phone]   Fax:  [phone]  Name: Mike Holloway MRN: [id number] Date of Birth: 12-25-1940

## 2019-10-23 ENCOUNTER — Encounter: Payer: Medicare Other | Admitting: Physical Therapy

## 2019-10-25 ENCOUNTER — Other Ambulatory Visit: Payer: Self-pay

## 2019-10-25 ENCOUNTER — Ambulatory Visit: Payer: Medicare Other | Admitting: Physical Therapy

## 2019-10-25 DIAGNOSIS — R293 Abnormal posture: Secondary | ICD-10-CM

## 2019-10-25 DIAGNOSIS — R2689 Other abnormalities of gait and mobility: Secondary | ICD-10-CM

## 2019-10-25 DIAGNOSIS — M6281 Muscle weakness (generalized): Secondary | ICD-10-CM

## 2019-10-25 DIAGNOSIS — R269 Unspecified abnormalities of gait and mobility: Secondary | ICD-10-CM

## 2019-10-25 NOTE — Therapy (Addendum)
[place] [facility] [address] [place], [place], [address] Phone: [phone]   Fax:  [phone]  Physical Therapy Treatment  Patient Details  Name: Mike Holloway MRN: [id number] Date of Birth: 06-18-1941 Referring Provider (PT): Dr. Rudene Christians   Encounter Date: 10/25/2019  PT End of Session - 10/25/19 1440    Visit Number  44    Number of Visits  48    Date for PT Re-Evaluation  11/06/19    Authorization - Visit Number  5    Authorization - Number of Visits  10    PT Start Time  1747    PT Stop Time  1431    PT Time Calculation (min)  88 min    Equipment Utilized During Treatment  Gait belt    Activity Tolerance  Patient tolerated treatment well;Patient limited by pain    Behavior During Therapy  [facility] for tasks assessed/performed       Past Medical History:  Diagnosis Date  . Arthritis   . Atrial flutter ([place])   . Diabetes mellitus ([place])   . Essential tremor   . Essential tremor    deep brain stimulator   . Hypercholesteremia   . Hypertension   . Incontinence   . 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[place])    grew on the testical  . Sleep apnea   . Stroke [facility])     Past Surgical History:  Procedure Laterality Date  . ABLATION    . APPENDECTOMY    . CATARACT EXTRACTION, BILATERAL    . COLONOSCOPY WITH PROPOFOL N/A 03/17/2018   Procedure: COLONOSCOPY WITH PROPOFOL;  Surgeon: Jonathon Bellows, MD;  Location: [facility] ENDOSCOPY;  Service: Gastroenterology;  Laterality: N/A;  . DEEP BRAIN STIMULATOR PLACEMENT    . 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 HEMORRHOID SURGERY    . HERNIA REPAIR    . HIP FRACTURE SURGERY    . INTRAMEDULLARY (IM) NAIL INTERTROCHANTERIC Right 09/16/2018   Procedure: INTRAMEDULLARY (IM) NAIL INTERTROCHANTRIC;  Surgeon: Hessie Knows, MD;  Location: [facility] ORS;  Service: Orthopedics;  Laterality:  Right;  . IR CATHETER TUBE CHANGE  11/22/2018  . NASAL SINUS SURGERY    . ORCHIECTOMY    . TONSILLECTOMY      There were no vitals filed for this visit.  Subjective Assessment - 10/25/19 1309    Subjective  Pt. reports that he received an injection from his physician in his R hip on this past Monday on 10/22/2019. Pt reports 1-2/10 soreness in his R knee and he describes his R knee is more sore than usual prior to today's session. Pt. reports he is able to perform sit to stand with UE assist at his dining table and he and his wife are happy about it.    Patient is accompained by:  Family member    Pertinent History  Pt. states R knee is bothering him but it is the typical discomfort he experiences    Limitations  House hold activities;Walking;Standing;Lifting    Patient Stated Goals  Improve LE strength/ gait and balance with daily tasks.  Improve gait/ safety/ progression to least assistive device.    Currently in Pain?  Yes    Pain Score  2     Pain Location  Knee    Pain Orientation  Right    Pain Descriptors / Indicators  Sore  Pain Type  Chronic pain    Pain Onset  More than a month ago    Pain Frequency  Intermittent       Therex: Nustep L 4 10 min (no charge) Sit to stand at blue mate table at 19.5 inches with cones reaching at standing phase (1 min at a time) x 8 reps, SBA/CGA Seated YTB W pull 20 reps x 1 set Seated weighted bar shoulder press 20 reps x 1 set 10 min heat on L knee (no charge)  Neuromuscular re-education: Standing swiss ball punching, CGA, 10 reps x 2 sets each arm, CGA, verbal cueing for maintaining balance (standing on airex with ball placing on cones x 45 s, step over a yoga block, step over a hard block within //-bars) x 2 laps x 2 sets, SBA Single leg step forward/backward/to the side with weight shifting within //-bars, 10 reps standing on L leg, 2 reps standing on R leg and is limited by R knee soreness, SBA, verbal cueing for feet placement and  proper techniques.   PT Long Term Goals - 10/11/19 0826      PT LONG TERM GOAL #1   Title  Pt. independent with HEP to increase B hip flexion/ R quad muscle strength 1/2 muscle grade to improve pain-free mobility.    Baseline  R knee extension limited to 4/5 MMT secondary to pain/ fear of pain.    Time  4    Period  Weeks    Status  Partially Met    Target Date  11/06/19      PT LONG TERM GOAL #2   Title  Pt. will increase Berg balance test to >40 out of 56 to improve independence with gait/ decrease fall risk.    Baseline  Berg: 22/56 (significant fall risk); 9/30 32/56.  11/10: 32 (limited by R knee pain)    Time  4    Period  Weeks    Status  Not Met    Target Date  11/06/19      PT LONG TERM GOAL #3   Title  Pt. able to ambulate 100 feet with consistent 2 point gait pattern and use of least restrictive assistive devce to improve household mobility.    Baseline  Pt. able to ambulate with use of RW/ CGA to min. A for safety.   Heavy use of B UE.; 9/30 pt able to ambulate with rollator with inconsistent 2 point gait pattern, heavy reliance on UE CGA/supervision    Time  4    Period  Weeks    Status  Partially Met    Target Date  11/06/19      PT LONG TERM GOAL #4   Title  Pt. able to stand from normal chair with no UE assist to improve safety/independence with transfers.     Baseline  Unable to stand from standard chair without heavy UE assist. 10/6, pt unable to stand from standard chair without UE support.  11/10: benefits from 1 UE assist    Time  4    Period  Weeks    Status  Partially Met    Target Date  11/06/19            Plan - 10/25/19 1508    Clinical Impression Statement  Pt. demonstrates improved standing static balance by standing 1 min x 10 reps with cone stacking without UE assist for maintaining balance. Pt. is limited by soreness in his R knee during balance training when  L leg is in single leg stance phase. Pt. requires two hands for UE assist during  balance training within //- bars due to increased soreness in his R knee from 1-2/10 to 3/10. Pt. will benefit from skilled PT to increase LE strength and endurance to improve independence of toileting and gait and decrease fall risks.    Examination-Activity Limitations  Toileting;Stand;Squat;Lift;Stairs    Stability/Clinical Decision Making  Evolving/Moderate complexity    Clinical Decision Making  Moderate    Rehab Potential  Fair    PT Frequency  2x / week    PT Duration  4 weeks    PT Treatment/Interventions  Moist Heat;Functional mobility training;Therapeutic activities;Therapeutic exercise;Balance training;Patient/family education;Manual techniques;Passive range of motion;Neuromuscular re-education;Stair training;Gait training;ADLs/Self Care Home Management    PT Next Visit Plan  Progress blue mate table sit to stand to 19-18 inches to improve independence in tolieting; balance training/more standing to improve LE endurance; decrease break frequency (timing for break)    PT Home Exercise Plan  [facility]    Consulted and Agree with Plan of Care  Patient;Family member/caregiver    Family Member Consulted  spouse: [name]       Patient will benefit from skilled therapeutic intervention in order to improve the following deficits and impairments:  Abnormal gait, Decreased endurance, Decreased activity tolerance, Pain, Decreased balance, Impaired flexibility, Decreased strength, Postural dysfunction  Visit Diagnosis: Muscle weakness (generalized)  Gait difficulty  Balance problem  Abnormal posture     Problem List Patient Active Problem List   Diagnosis Date Noted  . Lymphedema 04/15/2019  . Bilateral leg edema 01/25/2019  . Hyponatremia 12/24/2018  . SIADH (syndrome of inappropriate ADH production) ([place]) 12/24/2018  . Erosion of urethra due to catheterization of urinary tract ([place]) 10/27/2018  . Generalized weakness 10/14/2018  . Hip fracture ([place]) 09/15/2018  . Moderate mitral  insufficiency 08/16/2018  . 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 HLD (hyperlipidemia) 03/24/2018  . Contusion of right knee 02/14/2018  . Bradycardia 12/28/2017  . 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 Chronic pain of right knee 08/10/2016  . Right ankle pain 08/10/2016  . 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  . 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[place]) 12/11/2015  . Urinary retention 09/08/2015  . Lymphoma, 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[place]) 04/03/2015  . Breathlessness on exertion 11/21/2014  . Breath shortness 11/21/2014  . Arthropathy 11/07/2014  . Atrial flutter, paroxysmal ([place]) 11/07/2014  . Type 2 diabetes mellitus ([place]) 11/07/2014  . Benign essential tremor 11/07/2014  . Benign essential HTN 11/07/2014  . Mixed incontinence 11/07/2014  . Hypercholesterolemia without hypertriglyceridemia 11/07/2014  . Apnea, sleep 11/07/2014  . Controlled type 2 diabetes mellitus without complication ([place]) 40/81/4481  . Pure hypercholesterolemia 11/07/2014  . Other abnormalities of gait and mobility 11/02/2011  . Decreased mobility 11/02/2011  . Abnormal gait 06/29/2011  . Discoordination 06/29/2011   Pura Spice, PT, DPT # [id number], SPT 10/26/2019, 11:59 AM  [place] [facility] [address] [place], [place], [address] Phone: [phone]   Fax:  [phone]  Name: Yuji Walth MRN: [id number] Date of Birth: [birth year]

## 2019-10-26 ENCOUNTER — Encounter: Payer: Self-pay | Admitting: Physical Therapy

## 2019-10-30 ENCOUNTER — Ambulatory Visit: Payer: Medicare Other | Attending: Orthopedic Surgery | Admitting: Physical Therapy

## 2019-10-30 ENCOUNTER — Encounter: Payer: Self-pay | Admitting: Physical Therapy

## 2019-10-30 ENCOUNTER — Other Ambulatory Visit: Payer: Self-pay

## 2019-10-30 DIAGNOSIS — M6281 Muscle weakness (generalized): Secondary | ICD-10-CM | POA: Diagnosis present

## 2019-10-30 DIAGNOSIS — R293 Abnormal posture: Secondary | ICD-10-CM | POA: Insufficient documentation

## 2019-10-30 DIAGNOSIS — R269 Unspecified abnormalities of gait and mobility: Secondary | ICD-10-CM | POA: Insufficient documentation

## 2019-10-30 DIAGNOSIS — R2689 Other abnormalities of gait and mobility: Secondary | ICD-10-CM

## 2019-10-30 NOTE — Therapy (Signed)
[place] [place] [facility] [address] [place], [place], [address] Phone: [phone]   Fax:  [phone]  Physical Therapy Treatment  Patient Details  Name: Mike Holloway MRN: [id number] Date of Birth: 07-May-1941 Referring Provider (PT): Dr. Rudene Christians   Encounter Date: 10/30/2019  PT End of Session - 10/30/19 1311    Visit Number  45    Number of Visits  48    Date for PT Re-Evaluation  11/06/19    Authorization - Visit Number  6    Authorization - Number of Visits  10    PT Start Time  9735    PT Stop Time  1426    PT Time Calculation (min)  83 min    Equipment Utilized During Treatment  Gait belt    Activity Tolerance  Patient tolerated treatment well;Patient limited by pain;Patient limited by fatigue    Behavior During Therapy  [facility] for tasks assessed/performed       Past Medical History:  Diagnosis Date  . Arthritis   . Atrial flutter ([place])   . Diabetes mellitus ([place])   . Essential tremor   . Essential tremor    deep brain stimulator   . Hypercholesteremia   . Hypertension   . Incontinence   . 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 Sleep apnea   . Stroke [facility])     Past Surgical History:  Procedure Laterality Date  . ABLATION    . APPENDECTOMY    . CATARACT EXTRACTION, BILATERAL    . COLONOSCOPY WITH PROPOFOL N/A 03/17/2018   Procedure: COLONOSCOPY WITH PROPOFOL;  Surgeon: Jonathon Bellows, MD;  Location: [facility] ENDOSCOPY;  Service: Gastroenterology;  Laterality: N/A;  . DEEP BRAIN STIMULATOR PLACEMENT    . 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 HEMORRHOID SURGERY    . HERNIA REPAIR    . HIP FRACTURE SURGERY    . INTRAMEDULLARY (IM) NAIL INTERTROCHANTERIC Right 09/16/2018   Procedure: INTRAMEDULLARY (IM) NAIL INTERTROCHANTRIC;  Surgeon: Hessie Knows, MD;  Location: [facility] ORS;  Service:  Orthopedics;  Laterality: Right;  . IR CATHETER TUBE CHANGE  11/22/2018  . NASAL SINUS SURGERY    . ORCHIECTOMY    . TONSILLECTOMY      There were no vitals filed for this visit.  Subjective Assessment - 10/30/19 1305    Subjective  Pt. did not see any improvements in the knee after the injection. Pt. has been able to sit and stand at the kitchen table with UE support but reports that it is awkward placement for his arms when he tried to stand.    Patient is accompained by:  Family member    Pertinent History  Pt. states R knee is bothering him but it is the typical discomfort he experiences    Limitations  House hold activities;Walking;Standing;Lifting    Patient Stated Goals  Improve LE strength/ gait and balance with daily tasks.  Improve gait/ safety/ progression to least assistive device.    Currently in Pain?  Yes    Pain Score  1     Pain Location  Knee    Pain Orientation  Right    Pain Descriptors / Indicators  Aching    Pain Type  Chronic pain    Pain Onset  More than a month ago    Multiple Pain Sites  No  TheraEx Seated bilateral shoulder flexion with weighted wand 2 x 10 Seated 4# bicep curls 3 x 10 Seated punches with weighted bar 3 x 10  Seated UE diagonal pull with RTB 3 x 10 bilat.  Sit to stand at table to re-create home environment (17" chair, 30" table top) with one arm on table and one arm on chair to stand and walker on the right x 2 to improve efficiency of movement   Neuromuscular Re-Education Resisted forward/backward walking in //-bars x 2 with 10 sec standing with no UE support   Resisted side walking x 2 right and x 1 left in //-bars with 15 sec standing with no UE support Walking in //-bars with bilateral UE support (pt. Was reluctant to try with just the L hand for support due to unsteadiness with L LE in swing phase)     PT Long Term Goals - 10/11/19 0826      PT LONG TERM GOAL #1   Title  Pt. independent with HEP to increase B hip  flexion/ R quad muscle strength 1/2 muscle grade to improve pain-free mobility.    Baseline  R knee extension limited to 4/5 MMT secondary to pain/ fear of pain.    Time  4    Period  Weeks    Status  Partially Met    Target Date  11/06/19      PT LONG TERM GOAL #2   Title  Pt. will increase Berg balance test to >40 out of 56 to improve independence with gait/ decrease fall risk.    Baseline  Berg: 22/56 (significant fall risk); 9/30 32/56.  11/10: 32 (limited by R knee pain)    Time  4    Period  Weeks    Status  Not Met    Target Date  11/06/19      PT LONG TERM GOAL #3   Title  Pt. able to ambulate 100 feet with consistent 2 point gait pattern and use of least restrictive assistive devce to improve household mobility.    Baseline  Pt. able to ambulate with use of RW/ CGA to min. A for safety.   Heavy use of B UE.; 9/30 pt able to ambulate with rollator with inconsistent 2 point gait pattern, heavy reliance on UE CGA/supervision    Time  4    Period  Weeks    Status  Partially Met    Target Date  11/06/19      PT LONG TERM GOAL #4   Title  Pt. able to stand from normal chair with no UE assist to improve safety/independence with transfers.     Baseline  Unable to stand from standard chair without heavy UE assist. 10/6, pt unable to stand from standard chair without UE support.  11/10: benefits from 1 UE assist    Time  4    Period  Weeks    Status  Partially Met    Target Date  11/06/19            Plan - 10/30/19 1435    Clinical Impression Statement  Pt. demonstrated improved standing balance with no UE support with resistance but was limited due to low back pain for standing tolerance. Pt. was reluctant to try ambulating in the //-bars with R UE for support due to uneasiness with L LE in swing phase of gait. Pt. was able to use only the R UE for support with the R leg in swing phase. Pt. was  able to stand from a 17" chair with no arm rests at a table of 30" height with L  hand on table and R hand off the chair. Pt. was limited today due to LBP and R knee pain on the medial joint line.    Examination-Activity Limitations  Toileting;Stand;Squat;Lift;Stairs    Stability/Clinical Decision Making  Evolving/Moderate complexity    Clinical Decision Making  Moderate    Rehab Potential  Fair    PT Frequency  2x / week    PT Duration  4 weeks    PT Treatment/Interventions  Moist Heat;Functional mobility training;Therapeutic activities;Therapeutic exercise;Balance training;Patient/family education;Manual techniques;Passive range of motion;Neuromuscular re-education;Stair training;Gait training;ADLs/Self Care Home Management    PT Next Visit Plan  Progress blue mate table sit to stand to 19-18 inches to improve independence in tolieting; balance training/more standing to improve LE endurance; decrease break frequency (timing for break)    PT Home Exercise Plan  [facility]    Consulted and Agree with Plan of Care  Patient;Family member/caregiver    Family Member Consulted  spouse: [name]       Patient will benefit from skilled therapeutic intervention in order to improve the following deficits and impairments:  Abnormal gait, Decreased endurance, Decreased activity tolerance, Pain, Decreased balance, Impaired flexibility, Decreased strength, Postural dysfunction  Visit Diagnosis: Muscle weakness (generalized)  Gait difficulty  Balance problem  Abnormal posture     Problem List Patient Active Problem List   Diagnosis Date Noted  . Lymphedema 04/15/2019  . Bilateral leg edema 01/25/2019  . Hyponatremia 12/24/2018  . SIADH (syndrome of inappropriate ADH production) ([place]) 12/24/2018  . Erosion of urethra due to catheterization of urinary tract ([place]) 10/27/2018  . Generalized weakness 10/14/2018  . Hip fracture ([place]) 09/15/2018  . 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  HLD (hyperlipidemia) 03/24/2018  . Contusion of right knee 02/14/2018  . Bradycardia 12/28/2017  . 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 Chronic pain of right knee 08/10/2016  . Right ankle pain 08/10/2016  . 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  . 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[place]) 12/11/2015  . Urinary retention 09/08/2015  . Lymphoma, 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[place]) 04/03/2015  . Breathlessness on exertion 11/21/2014  . Breath shortness 11/21/2014  . Arthropathy 11/07/2014  . Atrial flutter, paroxysmal ([place]) 11/07/2014  . Type 2 diabetes mellitus ([place]) 11/07/2014  . Benign essential tremor 11/07/2014  . Benign essential HTN 11/07/2014  . Mixed incontinence 11/07/2014  . Hypercholesterolemia without hypertriglyceridemia 11/07/2014  . Apnea, sleep 11/07/2014  . Controlled type 2 diabetes mellitus without complication ([place]) 40/97/3532  . Pure hypercholesterolemia 11/07/2014  . Other abnormalities of gait and mobility 11/02/2011  . Decreased mobility 11/02/2011  . Abnormal gait 06/29/2011  . Discoordination 06/29/2011   Pura Spice, PT, DPT # [id number] Andrey [place]anile, SPT 10/31/2019, 10:57 AM  [place] [facility] [address] [place], [place], [address] Phone: [phone]   Fax:  [phone]  Name: Cordarious Zeek MRN: [id number] Date of Birth: 11-Jul-1941

## 2019-11-01 ENCOUNTER — Other Ambulatory Visit: Payer: Self-pay

## 2019-11-01 ENCOUNTER — Ambulatory Visit: Payer: Medicare Other | Admitting: Physical Therapy

## 2019-11-01 DIAGNOSIS — R2689 Other abnormalities of gait and mobility: Secondary | ICD-10-CM

## 2019-11-01 DIAGNOSIS — M6281 Muscle weakness (generalized): Secondary | ICD-10-CM | POA: Diagnosis not present

## 2019-11-01 DIAGNOSIS — R269 Unspecified abnormalities of gait and mobility: Secondary | ICD-10-CM

## 2019-11-01 DIAGNOSIS — R293 Abnormal posture: Secondary | ICD-10-CM

## 2019-11-01 NOTE — Patient Instructions (Signed)
New HEP Program  Seated Marching 3 sec hold  Seated Calf Raises  Seated LAQ 3 sec hold  Seated hip abduction with BTB Seated shoulder flexion AROM Seated diagonal pulls with YTB  Seated horizontal abduction with RTB  Seated scapular retraction with RTB

## 2019-11-01 NOTE — Therapy (Signed)
[place] [facility] [address] [place], [place], [address] Phone: [phone]   Fax:  [phone]  Physical Therapy Treatment  Patient Details  Name: Mike Holloway MRN: [id number] Date of Birth: 02-Feb-1941 Referring Provider (PT): Dr. Rudene Christians   Encounter Date: 11/01/2019  PT End of Session - 11/01/19 1449    Visit Number  65    Number of Visits  48    Date for PT Re-Evaluation  11/06/19    Authorization - Visit Number  7    Authorization - Number of Visits  10    PT Start Time  1301    PT Stop Time  1426    PT Time Calculation (min)  85 min    Equipment Utilized During Treatment  Gait belt    Activity Tolerance  Patient tolerated treatment well;Patient limited by pain;Patient limited by fatigue    Behavior During Therapy  [facility] for tasks assessed/performed       Past Medical History:  Diagnosis Date  . Arthritis   . Atrial flutter ([place])   . Diabetes mellitus ([place])   . Essential tremor   . Essential tremor    deep brain stimulator   . Hypercholesteremia   . Hypertension   . Incontinence   . 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[place])    grew on the testical  . Sleep apnea   . Stroke [facility])     Past Surgical History:  Procedure Laterality Date  . ABLATION    . APPENDECTOMY    . CATARACT EXTRACTION, BILATERAL    . COLONOSCOPY WITH PROPOFOL N/A 03/17/2018   Procedure: COLONOSCOPY WITH PROPOFOL;  Surgeon: Jonathon Bellows, MD;  Location: [facility] ENDOSCOPY;  Service: Gastroenterology;  Laterality: N/A;  . DEEP BRAIN STIMULATOR PLACEMENT    . 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 HEMORRHOID SURGERY    . HERNIA REPAIR    . HIP FRACTURE SURGERY    . INTRAMEDULLARY (IM) NAIL INTERTROCHANTERIC Right 09/16/2018   Procedure: INTRAMEDULLARY (IM) NAIL INTERTROCHANTRIC;  Surgeon: Hessie Knows, MD;  Location: [facility] ORS;  Service:  Orthopedics;  Laterality: Right;  . IR CATHETER TUBE CHANGE  11/22/2018  . NASAL SINUS SURGERY    . ORCHIECTOMY    . TONSILLECTOMY      There were no vitals filed for this visit.  Subjective Assessment - 11/01/19 1447    Subjective  Pt reports he has some R hip and knee pain today both 1/10. Pt. reports that he was successful at trying the new way of standing at the table with greater ease.    Patient is accompained by:  Family member    Pertinent History  Pt. states R knee is bothering him but it is the typical discomfort he experiences    Limitations  House hold activities;Walking;Standing;Lifting    Patient Stated Goals  Improve LE strength/ gait and balance with daily tasks.  Improve gait/ safety/ progression to least assistive device.    Currently in Pain?  Yes    Pain Score  1     Pain Location  Knee    Pain Orientation  Right    Pain Descriptors / Indicators  Aching    Pain Type  Chronic pain    Pain Onset  More than a month ago    Multiple Pain Sites  Yes    Pain Location  Hip  Pain Descriptors / Indicators  Aching    Pain Type  Chronic pain    Pain Onset  More than a month ago       TheraEx Seated Marching 3 sec hold x 20 alternating Seated Calf Raises x 20 Seated LAQ 3 sec hold x 10 alternating Seated shoulder flexion x 20 Seated diagonal pulls with YTB 2 x 10 bilaterally Seated horizontal abduction with RTB x 10  Seated hip abduction with BTB 2 x 25  Neuromuscular Re-education Walking in hallway with focus on increasing step length, standing tolerance and cueing for posture with gait 2 x 2 laps Stepping over hurdles in //-bars x 1 Standing weight shift and kicking rainbow ball with one UE hand for support x 3 min  Standing functional reaching (fishing) activity with no UE support x 4 minutes  Moist heat to Low Back x 10 min    PT Education - 11/01/19 1448    Education Details  Pt. was given a new seated HEP for both UE and LE    Person(s) Educated   Patient;Spouse    Methods  Explanation;Demonstration;Handout    Comprehension  Verbalized understanding;Returned demonstration        PT Long Term Goals - 10/11/19 0826      PT LONG TERM GOAL #1   Title  Pt. independent with HEP to increase B hip flexion/ R quad muscle strength 1/2 muscle grade to improve pain-free mobility.    Baseline  R knee extension limited to 4/5 MMT secondary to pain/ fear of pain.    Time  4    Period  Weeks    Status  Partially Met    Target Date  11/06/19      PT LONG TERM GOAL #2   Title  Pt. will increase Berg balance test to >40 out of 56 to improve independence with gait/ decrease fall risk.    Baseline  Berg: 22/56 (significant fall risk); 9/30 32/56.  11/10: 32 (limited by R knee pain)    Time  4    Period  Weeks    Status  Not Met    Target Date  11/06/19      PT LONG TERM GOAL #3   Title  Pt. able to ambulate 100 feet with consistent 2 point gait pattern and use of least restrictive assistive devce to improve household mobility.    Baseline  Pt. able to ambulate with use of RW/ CGA to min. A for safety.   Heavy use of B UE.; 9/30 pt able to ambulate with rollator with inconsistent 2 point gait pattern, heavy reliance on UE CGA/supervision    Time  4    Period  Weeks    Status  Partially Met    Target Date  11/06/19      PT LONG TERM GOAL #4   Title  Pt. able to stand from normal chair with no UE assist to improve safety/independence with transfers.     Baseline  Unable to stand from standard chair without heavy UE assist. 10/6, pt unable to stand from standard chair without UE support.  11/10: benefits from 1 UE assist    Time  4    Period  Weeks    Status  Partially Met    Target Date  11/06/19            Plan - 11/01/19 1449    Clinical Impression Statement  Pt. demonstrated increased standing tolerace with gait activities in the  hallway for a total of 2 laps before needing to take a rest break. During rest periods, pt. completed  seated strengthening activites for UE and LE. Pt. does require extended rest periods due to R hip and knee pain as well as LBP.  Pt. requires some verbal cueing for posture and step length with gait activities. Pt. was hesitant about trying an activity with no UE support outside of the //-bars with CGA and gait belt. Pt. was able to stand with no UE support and complete a functional reaching activity with min assist. Pt. requires UE support with any SLS activities. Pt. was educated on new seated HEP program he can perform while sitting at the kitchen table.    Examination-Activity Limitations  Toileting;Stand;Squat;Lift;Stairs    Stability/Clinical Decision Making  Evolving/Moderate complexity    Clinical Decision Making  Moderate    Rehab Potential  Fair    PT Frequency  2x / week    PT Duration  4 weeks    PT Treatment/Interventions  Moist Heat;Functional mobility training;Therapeutic activities;Therapeutic exercise;Balance training;Patient/family education;Manual techniques;Passive range of motion;Neuromuscular re-education;Stair training;Gait training;ADLs/Self Care Home Management    PT Next Visit Plan  RECERTIFICATION    PT Home Exercise Plan  [facility]    Consulted and Agree with Plan of Care  Patient;Family member/caregiver    Family Member Consulted  spouse: [name]       Patient will benefit from skilled therapeutic intervention in order to improve the following deficits and impairments:  Abnormal gait, Decreased endurance, Decreased activity tolerance, Pain, Decreased balance, Impaired flexibility, Decreased strength, Postural dysfunction  Visit Diagnosis: Muscle weakness (generalized)  Gait difficulty  Balance problem  Abnormal posture     Problem List Patient Active Problem List   Diagnosis Date Noted  . Lymphedema 04/15/2019  . Bilateral leg edema 01/25/2019  . Hyponatremia 12/24/2018  . SIADH (syndrome of inappropriate ADH production) ([place]) 12/24/2018  . Erosion of  urethra due to catheterization of urinary tract ([place]) 10/27/2018  . Generalized weakness 10/14/2018  . Hip fracture ([place]) 09/15/2018  . 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 HLD (hyperlipidemia) 03/24/2018  . Contusion of right knee 02/14/2018  . Bradycardia 12/28/2017  . 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 Chronic pain of right knee 08/10/2016  . Right ankle pain 08/10/2016  . 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  . 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[place]) 12/11/2015  . Urinary retention 09/08/2015  . Lymphoma, 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[place]) 04/03/2015  . Breathlessness on exertion 11/21/2014  . Breath shortness 11/21/2014  . Arthropathy 11/07/2014  . Atrial flutter, paroxysmal ([place]) 11/07/2014  . Type 2 diabetes mellitus ([place]) 11/07/2014  . Benign essential tremor 11/07/2014  . Benign essential HTN 11/07/2014  . Mixed incontinence 11/07/2014  . Hypercholesterolemia without hypertriglyceridemia 11/07/2014  . Apnea, sleep 11/07/2014  . Controlled type 2 diabetes mellitus without complication ([place]) 73/42/8768  . Pure hypercholesterolemia 11/07/2014  . Other abnormalities of gait and mobility 11/02/2011  . Decreased mobility 11/02/2011  . Abnormal gait 06/29/2011  . Discoordination 06/29/2011    Pura Spice, PT, DPT # [id number] Andrey Campanile, SPT 11/02/2019, 1:15 PM  [place] [facility] [address] [place], [place], [address] Phone: [phone]   Fax:  [phone]  Name: Mike Holloway MRN: [id number] Date of Birth: Dec 09, 1940

## 2019-11-02 ENCOUNTER — Encounter: Payer: Self-pay | Admitting: Physical Therapy

## 2019-11-06 ENCOUNTER — Other Ambulatory Visit: Payer: Self-pay

## 2019-11-06 ENCOUNTER — Ambulatory Visit: Payer: Medicare Other | Admitting: Physical Therapy

## 2019-11-06 DIAGNOSIS — R269 Unspecified abnormalities of gait and mobility: Secondary | ICD-10-CM

## 2019-11-06 DIAGNOSIS — R2689 Other abnormalities of gait and mobility: Secondary | ICD-10-CM

## 2019-11-06 DIAGNOSIS — R293 Abnormal posture: Secondary | ICD-10-CM

## 2019-11-06 DIAGNOSIS — M6281 Muscle weakness (generalized): Secondary | ICD-10-CM

## 2019-11-06 NOTE — Therapy (Signed)
[place] [facility] [address] [place], [place], [address] Phone: [phone]   Fax:  [phone]  Physical Therapy Treatment  Patient Details  Name: Mike Holloway MRN: [id number] Date of Birth: [birth year] Referring Provider (PT): Dr. Rudene Christians   Encounter Date: 11/06/2019  PT End of Session - 11/06/19 1428    Visit Number  68    Number of Visits  55    Date for PT Re-Evaluation  01/01/20    Authorization - Visit Number  1    Authorization - Number of Visits  10    PT Start Time  2330    PT Stop Time  1424    PT Time Calculation (min)  81 min    Equipment Utilized During Treatment  Gait belt    Activity Tolerance  Patient tolerated treatment well;Patient limited by pain;Patient limited by fatigue    Behavior During Therapy  [facility] for tasks assessed/performed       Past Medical History:  Diagnosis Date  . Arthritis   . Atrial flutter ([place])   . Diabetes mellitus ([place])   . Essential tremor   . Essential tremor    deep brain stimulator   . Hypercholesteremia   . Hypertension   . Incontinence   . 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 Sleep apnea   . Stroke [facility])     Past Surgical History:  Procedure Laterality Date  . ABLATION    . APPENDECTOMY    . CATARACT EXTRACTION, BILATERAL    . COLONOSCOPY WITH PROPOFOL N/A 03/17/2018   Procedure: COLONOSCOPY WITH PROPOFOL;  Surgeon: Jonathon Bellows, MD;  Location: [facility] ENDOSCOPY;  Service: Gastroenterology;  Laterality: N/A;  . DEEP BRAIN STIMULATOR PLACEMENT    . 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 HEMORRHOID SURGERY    . HERNIA REPAIR    . HIP FRACTURE SURGERY    . INTRAMEDULLARY (IM) NAIL INTERTROCHANTERIC Right 09/16/2018   Procedure: INTRAMEDULLARY (IM) NAIL INTERTROCHANTRIC;  Surgeon: Hessie Knows, MD;  Location: [facility] ORS;  Service:  Orthopedics;  Laterality: Right;  . IR CATHETER TUBE CHANGE  11/22/2018  . NASAL SINUS SURGERY    . ORCHIECTOMY    . TONSILLECTOMY      There were no vitals filed for this visit.  Subjective Assessment - 11/06/19 1333    Subjective  Pt. reports he was able to complete his new HEP program every day this weekend and monday afternoon. Pt. reports fear of getting stuck on the toliet and no place to push up from to get off. Pt. has hand rails in his bathroom but does not feel confident pulling up with his UE.    Patient is accompained by:  Family member    Pertinent History  Pt. states R knee is bothering him but it is the typical discomfort he experiences    Limitations  House hold activities;Walking;Standing;Lifting    Patient Stated Goals  Improve LE strength/ gait and balance with daily tasks.  Improve gait/ safety/ progression to least assistive device.    Pain Score  1     Pain Location  Knee    Pain Orientation  Right    Pain Descriptors / Indicators  Aching    Pain Onset  More than a month ago    Pain Onset  More than a month ago  [facility] PT Assessment - 11/07/19 0001      Assessment   Medical Diagnosis  S/p R subtrochanteric hip fracture, S/p R ORIF fracture of hip.      Referring Provider (PT)  Dr. Rudene Christians    Onset Date/Surgical Date  09/15/18    Prior Therapy  Yes, known well to PT      Balance Screen   Has the patient fallen in the past 6 months  No      Prior Function   Level of Independence  Needs assistance with gait      Cognition   Overall Cognitive Status  Within Functional Limits for tasks assessed         TheraEx:  NuStep 10' Level 4 SPM >77 entire ride Seated shoulder flexion with weighted wand 2 x 15 Seated shoulder horizontal abd/adduction x 15 Seated chest press 2 x 15  Seated Hip flexion with 3 sec holds alternating Seated knee extension with 5 second hold 16 x alternating   Neuromuscular Re-Education:  BERG Balance Testing (pt. Has difficulty  with weight shifting to the R LE and lifting the L LE, turning over the shoulders, 360 deg turns, step touches, tandem stance, and SLS)- 33/56     PT Long Term Goals - 11/06/19 1434      PT LONG TERM GOAL #1   Title  Pt. independent with HEP to increase B hip flexion/ R quad muscle strength 1/2 muscle grade to improve pain-free mobility.    Baseline  R knee extension limited to 4/5 MMT secondary to pain/ fear of pain. 2/9: Deferred    Time  4    Period  Weeks    Status  Partially Met    Target Date  01/01/20      PT LONG TERM GOAL #2   Title  Pt. will increase Berg balance test to >40 out of 56 to improve independence with gait/ decrease fall risk.    Baseline  Berg: 22/56 (significant fall risk); 9/30 32/56.  11/10: 32 (limited by R knee pain)  2/9: 33/56 limited due to R knee and Low Back pain    Time  4    Period  Weeks    Status  Not Met    Target Date  01/01/20      PT LONG TERM GOAL #3   Title  Pt. able to ambulate 100 feet with consistent 2 point gait pattern and use of least restrictive assistive devce to improve household mobility.    Baseline  Pt. able to ambulate with use of RW/ CGA to min. A for safety.   Heavy use of B UE.; 9/30 pt able to ambulate with rollator with inconsistent 2 point gait pattern, heavy reliance on UE CGA/supervision  2/9: Pt. uses 2 point step pattern with walking with rollader with decreased stride length. Pt. would be limited due to pain in the knee walking for >100 ft.    Time  4    Period  Weeks    Status  Partially Met      PT LONG TERM GOAL #4   Title  Pt. able to stand from normal chair with no UE assist to improve safety/independence with transfers.     Baseline  Unable to stand from standard chair without heavy UE assist. 10/6, pt unable to stand from standard chair without UE support.  11/10: benefits from 1 UE assist    2/9: Pt. can rise from normal chair with 1 UE assist and  must control descent with UE assist.    Time  4    Period   Weeks    Status  Partially Met    Target Date  01/01/20      PT LONG TERM GOAL #5   Title  Pt. will perform a sit to stand with 1 UE support to be able to perform toileting activities with more independance.    Baseline  Pt. requires 2 UE to push from to sit to stand from standard chair height. Pt. has grab rails available but can not get up by pulling.    Time  8    Period  Weeks    Status  New    Target Date  01/01/20            Plan - 11/06/19 1428    Clinical Impression Statement  Pt. has demonstrated imrpovements in functional activities such as sit to stands from a low surface with UE support. Pt. requires CGA/SBA for balance activities in the //-bars and is hesitant to try gait and balance activities with no UE support. Pt. has decreased LE muscular endurance and requires frequent breaks/is limited due to his low back pain and R hip/knee pain. Pt. is unable to shift weight to R LE and lift the L LE without heavy UE support. Pt. has decreased stride length and cadence due to fear of falling with usage of rollader walker. Pt. would benefit from skilled PT services to improve movement efficiency and energy conservation with fuctional activities.    Examination-Activity Limitations  Toileting;Stand;Squat;Lift;Stairs    Stability/Clinical Decision Making  Evolving/Moderate complexity    Clinical Decision Making  Moderate    Rehab Potential  Fair    PT Frequency  1x / week    PT Duration  8 weeks    PT Treatment/Interventions  Moist Heat;Functional mobility training;Therapeutic activities;Therapeutic exercise;Balance training;Patient/family education;Manual techniques;Passive range of motion;Neuromuscular re-education;Stair training;Gait training;ADLs/Self Care Home Management    PT Next Visit Plan  Pulling with UE to mimic grab bars    PT Home Exercise Plan  [email or web address]    Consulted and Agree with Plan of Care  Patient;Family member/caregiver    Family Member Consulted  spouse: [name]        Patient will benefit from skilled therapeutic intervention in order to improve the following deficits and impairments:  Abnormal gait, Decreased endurance, Decreased activity tolerance, Pain, Decreased balance, Impaired flexibility, Decreased strength, Postural dysfunction  Visit Diagnosis: Muscle weakness (generalized)  Gait difficulty  Balance problem  Abnormal posture     Problem List Patient Active Problem List   Diagnosis Date Noted  . Lymphedema 04/15/2019  . Bilateral leg edema 01/25/2019  . Hyponatremia 12/24/2018  . SIADH (syndrome of inappropriate ADH production) ([place]) 12/24/2018  . Erosion of urethra due to catheterization of urinary tract ([place]) 10/27/2018  . Generalized weakness 10/14/2018  . Hip fracture ([place]) 09/15/2018  . 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 HLD (hyperlipidemia) 03/24/2018  . Contusion of right knee 02/14/2018  . Bradycardia 12/28/2017  . 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 Chronic pain of right knee 08/10/2016  . Right ankle pain 08/10/2016  . 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  . 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[place]) 12/11/2015  . Urinary retention 09/08/2015  . Lymphoma, 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[place]) 04/03/2015  . Breathlessness on exertion 11/21/2014  . Breath shortness 11/21/2014  . Arthropathy 11/07/2014  . Atrial flutter, paroxysmal ([place]) 11/07/2014  .  Type 2 diabetes mellitus ([place]) 11/07/2014  . Benign essential tremor 11/07/2014  . Benign essential HTN 11/07/2014  . Mixed incontinence 11/07/2014  . Hypercholesterolemia without hypertriglyceridemia 11/07/2014  . Apnea, sleep 11/07/2014  . Controlled type 2 diabetes mellitus without complication ([place]) 17/98/1025  . Pure hypercholesterolemia 11/07/2014  . Other abnormalities of gait and mobility 11/02/2011  . Decreased mobility 11/02/2011  . Abnormal gait  06/29/2011  . Discoordination 06/29/2011   Pura Spice, PT, DPT # [id number] Andrey Campanile, SPT 11/07/2019, 2:09 PM  [place] [facility] [address] [place], [place], [address] Phone: [phone]   Fax:  [phone]  Name: Mike Holloway MRN: [id number] Date of Birth: 08/04/1941

## 2019-11-07 ENCOUNTER — Encounter: Payer: Self-pay | Admitting: Physical Therapy

## 2019-11-08 ENCOUNTER — Encounter: Payer: Medicare Other | Admitting: Physical Therapy

## 2019-11-13 ENCOUNTER — Other Ambulatory Visit: Payer: Self-pay

## 2019-11-13 ENCOUNTER — Ambulatory Visit: Payer: Medicare Other | Admitting: Physical Therapy

## 2019-11-13 ENCOUNTER — Encounter: Payer: Self-pay | Admitting: Physical Therapy

## 2019-11-13 DIAGNOSIS — R269 Unspecified abnormalities of gait and mobility: Secondary | ICD-10-CM

## 2019-11-13 DIAGNOSIS — R2689 Other abnormalities of gait and mobility: Secondary | ICD-10-CM

## 2019-11-13 DIAGNOSIS — R293 Abnormal posture: Secondary | ICD-10-CM

## 2019-11-13 DIAGNOSIS — M6281 Muscle weakness (generalized): Secondary | ICD-10-CM | POA: Diagnosis not present

## 2019-11-13 NOTE — Therapy (Signed)
[facility] [address] [place], [place], [address] Phone: [phone]   Fax:  [phone]  Physical Therapy Treatment  Patient Details  Name: Mike Holloway MRN: [id number] Date of Birth: Dec 02, 1940 Referring Provider (PT): Dr. Rudene Christians   Encounter Date: 11/13/2019  PT End of Session - 11/13/19 1455    Visit Number  48    Number of Visits  55    Date for PT Re-Evaluation  01/01/20    Authorization - Visit Number  2    Authorization - Number of Visits  10    PT Start Time  1301    PT Stop Time  1416    PT Time Calculation (min)  75 min    Equipment Utilized During Treatment  Gait belt    Activity Tolerance  Patient tolerated treatment well;Patient limited by pain;Patient limited by fatigue    Behavior During Therapy  [facility] for tasks assessed/performed       Past Medical History:  Diagnosis Date  . Arthritis   . Atrial flutter ([place])   . Diabetes mellitus ([place])   . Essential tremor   . Essential tremor    deep brain stimulator   . Hypercholesteremia   . Hypertension   . Incontinence   . 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[place])    grew on the testical  . Sleep apnea   . Stroke [facility])     Past Surgical History:  Procedure Laterality Date  . ABLATION    . APPENDECTOMY    . CATARACT EXTRACTION, BILATERAL    . COLONOSCOPY WITH PROPOFOL N/A 03/17/2018   Procedure: COLONOSCOPY WITH PROPOFOL;  Surgeon: Jonathon Bellows, MD;  Location: [facility] ENDOSCOPY;  Service: Gastroenterology;  Laterality: N/A;  . DEEP BRAIN STIMULATOR PLACEMENT    . 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 ENDOSCOPY;  Service: Gastroenterology;  Laterality: N/A;  . HEMORRHOID SURGERY    . HERNIA REPAIR    . HIP FRACTURE SURGERY    . INTRAMEDULLARY (IM) NAIL INTERTROCHANTERIC Right 09/16/2018   Procedure: INTRAMEDULLARY (IM) NAIL INTERTROCHANTRIC;  Surgeon: Hessie Knows, MD;  Location: [facility] ORS;  Service:  Orthopedics;  Laterality: Right;  . IR CATHETER TUBE CHANGE  11/22/2018  . NASAL SINUS SURGERY    . ORCHIECTOMY    . TONSILLECTOMY      There were no vitals filed for this visit.  Subjective Assessment - 11/13/19 1453    Subjective  Pt. reports that he was able to transfer off of the toliet using the grab bars successfully and was able to complete his HEP as prescribed.    Patient is accompained by:  Family member    Pertinent History  Pt. states R knee is bothering him but it is the typical discomfort he experiences    Limitations  House hold activities;Walking;Standing;Lifting    Patient Stated Goals  Improve LE strength/ gait and balance with daily tasks.  Improve gait/ safety/ progression to least assistive device.    Currently in Pain?  No/denies    Pain Onset  More than a month ago    Multiple Pain Sites  No    Pain Onset  More than a month ago       TheraEx  Seated ipsilateral and contralateral UE/LE lifts while seating on wobble pad 1 minute x 2  Each with 1 minute breaks in between Seated LAQ 3# 5 sec holds x 10 alternating Seated Heel raises with  3# 30x  Seated Hip ABD with double looped GTB 20x  Neuromuscular Re-Education Standing cone taps/stacking with no UE support 2.5 minutes x 1 with 3 min rest break Standing fishing with no UE support 2.5 minutes x 2 with 3 min rest breaks  Standing tandem walking in //-bars with UE support 2.5 minutes x 1 with 2 min rest breaks Standing 6" step touches in //-bars with UE support 3 minutes x 2 with 2 min rest breaks    PT Long Term Goals - 11/06/19 1434      PT LONG TERM GOAL #1   Title  Pt. independent with HEP to increase B hip flexion/ R quad muscle strength 1/2 muscle grade to improve pain-free mobility.    Baseline  R knee extension limited to 4/5 MMT secondary to pain/ fear of pain. 2/9: Deferred    Time  4    Period  Weeks    Status  Partially Met    Target Date  01/01/20      PT LONG TERM GOAL #2   Title  Pt.  will increase Berg balance test to >40 out of 56 to improve independence with gait/ decrease fall risk.    Baseline  Berg: 22/56 (significant fall risk); 9/30 32/56.  11/10: 32 (limited by R knee pain)  2/9: 33/56 limited due to R knee and Low Back pain    Time  4    Period  Weeks    Status  Not Met    Target Date  01/01/20      PT LONG TERM GOAL #3   Title  Pt. able to ambulate 100 feet with consistent 2 point gait pattern and use of least restrictive assistive devce to improve household mobility.    Baseline  Pt. able to ambulate with use of RW/ CGA to min. A for safety.   Heavy use of B UE.; 9/30 pt able to ambulate with rollator with inconsistent 2 point gait pattern, heavy reliance on UE CGA/supervision  2/9: Pt. uses 2 point step pattern with walking with rollader with decreased stride length. Pt. would be limited due to pain in the knee walking for >100 ft.    Time  4    Period  Weeks    Status  Partially Met      PT LONG TERM GOAL #4   Title  Pt. able to stand from normal chair with no UE assist to improve safety/independence with transfers.     Baseline  Unable to stand from standard chair without heavy UE assist. 10/6, pt unable to stand from standard chair without UE support.  11/10: benefits from 1 UE assist    2/9: Pt. can rise from normal chair with 1 UE assist and must control descent with UE assist.    Time  4    Period  Weeks    Status  Partially Met    Target Date  01/01/20      PT LONG TERM GOAL #5   Title  Pt. will perform a sit to stand with 1 UE support to be able to perform toileting activities with more independance.    Baseline  Pt. requires 2 UE to push from to sit to stand from standard chair height. Pt. has grab rails available but can not get up by pulling.    Time  8    Period  Weeks    Status  New    Target Date  01/01/20  Plan - 11/13/19 1501    Clinical Impression Statement  Pt. was motivated to stand when being timed with the timer to  improve standing tolerance. Pt. is limited due to discomfort in the low back after standing for > 3 minutes. Pt. required SBA with the gait belt for standing activities with no UE support. Pt. was fearful to take steps forward without usage of rollader/UE support. Pt. showed improved placement of feet independently with tandem walking and improved standing posture with verbal cueing. Pt. had slight R hip circumduction with static stepping in the //-bars at the end of treatment.    Examination-Activity Limitations  Toileting;Stand;Squat;Lift;Stairs    Stability/Clinical Decision Making  Evolving/Moderate complexity    Clinical Decision Making  Moderate    Rehab Potential  Fair    PT Frequency  1x / week    PT Duration  8 weeks    PT Treatment/Interventions  Moist Heat;Functional mobility training;Therapeutic activities;Therapeutic exercise;Balance training;Patient/family education;Manual techniques;Passive range of motion;Neuromuscular re-education;Stair training;Gait training;ADLs/Self Care Home Management    PT Next Visit Plan  Pulling with UE to mimic grab bars, TRX    PT Home Exercise Plan  [email or web address]    Consulted and Agree with Plan of Care  Patient;Family member/caregiver    Family Member Consulted  spouse: [name]       Patient will benefit from skilled therapeutic intervention in order to improve the following deficits and impairments:  Abnormal gait, Decreased endurance, Decreased activity tolerance, Pain, Decreased balance, Impaired flexibility, Decreased strength, Postural dysfunction  Visit Diagnosis: Muscle weakness (generalized)  Gait difficulty  Balance problem  Abnormal posture     Problem List Patient Active Problem List   Diagnosis Date Noted  . Lymphedema 04/15/2019  . Bilateral leg edema 01/25/2019  . Hyponatremia 12/24/2018  . SIADH (syndrome of inappropriate ADH production) ([place]) 12/24/2018  . Erosion of urethra due to catheterization of urinary tract ([place])  10/27/2018  . Generalized weakness 10/14/2018  . Hip fracture ([place]) 09/15/2018  . 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 HLD (hyperlipidemia) 03/24/2018  . Contusion of right knee 02/14/2018  . Bradycardia 12/28/2017  . 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 Chronic pain of right knee 08/10/2016  . Right ankle pain 08/10/2016  . 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  . 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[place]) 12/11/2015  . Urinary retention 09/08/2015  . Lymphoma, 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[place]) 04/03/2015  . Breathlessness on exertion 11/21/2014  . Breath shortness 11/21/2014  . Arthropathy 11/07/2014  . Atrial flutter, paroxysmal ([place]) 11/07/2014  . Type 2 diabetes mellitus ([place]) 11/07/2014  . Benign essential tremor 11/07/2014  . Benign essential HTN 11/07/2014  . Mixed incontinence 11/07/2014  . Hypercholesterolemia without hypertriglyceridemia 11/07/2014  . Apnea, sleep 11/07/2014  . Controlled type 2 diabetes mellitus without complication ([place]) 73/41/9379  . Pure hypercholesterolemia 11/07/2014  . Other abnormalities of gait and mobility 11/02/2011  . Decreased mobility 11/02/2011  . Abnormal gait 06/29/2011  . Discoordination 06/29/2011   Pura Spice, PT, DPT # [id number] Andrey Campanile, SPT 11/13/2019, 3:11 PM  [facility] [address] [place], [place], [address] Phone: [phone]   Fax:  [phone]  Name: Dell Briner MRN: [id number] Date of Birth: 09/21/1941

## 2019-11-15 ENCOUNTER — Encounter: Payer: Medicare Other | Admitting: Physical Therapy

## 2019-11-20 ENCOUNTER — Other Ambulatory Visit: Payer: Self-pay

## 2019-11-20 ENCOUNTER — Ambulatory Visit: Payer: Medicare Other | Admitting: Physical Therapy

## 2019-11-20 DIAGNOSIS — R2689 Other abnormalities of gait and mobility: Secondary | ICD-10-CM

## 2019-11-20 DIAGNOSIS — M6281 Muscle weakness (generalized): Secondary | ICD-10-CM | POA: Diagnosis not present

## 2019-11-20 DIAGNOSIS — R293 Abnormal posture: Secondary | ICD-10-CM

## 2019-11-20 DIAGNOSIS — R269 Unspecified abnormalities of gait and mobility: Secondary | ICD-10-CM

## 2019-11-20 NOTE — Therapy (Signed)
[place] [facility] [address] [place], [place], [address] Phone: [phone]   Fax:  [phone]  Physical Therapy Treatment  Patient Details  Name: Mike Holloway MRN: [id number] Date of Birth: [birth year] Referring Provider (PT): Dr. Rudene Christians   Encounter Date: 11/20/2019  PT End of Session - 11/20/19 1428    Visit Number  35    Number of Visits  55    Date for PT Re-Evaluation  01/01/20    Authorization - Visit Number  3    Authorization - Number of Visits  10    PT Start Time  1301    PT Stop Time  1418    PT Time Calculation (min)  77 min    Equipment Utilized During Treatment  Gait belt    Activity Tolerance  Patient tolerated treatment well;Patient limited by pain;Patient limited by fatigue    Behavior During Therapy  [facility] for tasks assessed/performed       Past Medical History:  Diagnosis Date  . Arthritis   . Atrial flutter ([place])   . Diabetes mellitus ([place])   . Essential tremor   . Essential tremor    deep brain stimulator   . Hypercholesteremia   . Hypertension   . Incontinence   . 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 Sleep apnea   . Stroke [facility])     Past Surgical History:  Procedure Laterality Date  . ABLATION    . APPENDECTOMY    . CATARACT EXTRACTION, BILATERAL    . COLONOSCOPY WITH PROPOFOL N/A 03/17/2018   Procedure: COLONOSCOPY WITH PROPOFOL;  Surgeon: Jonathon Bellows, MD;  Location: [facility] ENDOSCOPY;  Service: Gastroenterology;  Laterality: N/A;  . DEEP BRAIN STIMULATOR PLACEMENT    . 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 HEMORRHOID SURGERY    . HERNIA REPAIR    . HIP FRACTURE SURGERY    . INTRAMEDULLARY (IM) NAIL INTERTROCHANTERIC Right 09/16/2018   Procedure: INTRAMEDULLARY (IM) NAIL INTERTROCHANTRIC;  Surgeon: Hessie Knows, MD;  Location: [facility] ORS;  Service:  Orthopedics;  Laterality: Right;  . IR CATHETER TUBE CHANGE  11/22/2018  . NASAL SINUS SURGERY    . ORCHIECTOMY    . TONSILLECTOMY      There were no vitals filed for this visit.  Subjective Assessment - 11/20/19 1424    Subjective  Pt. reports that he did not use the toliet and went back to using the bedside commode due to increased knee pain this week.    Patient is accompained by:  Family member    Pertinent History  Pt. states R knee is bothering him but it is the typical discomfort he experiences    Limitations  House hold activities;Walking;Standing;Lifting    Patient Stated Goals  Improve LE strength/ gait and balance with daily tasks.  Improve gait/ safety/ progression to least assistive device.    Currently in Pain?  Yes    Pain Score  1     Pain Location  Knee    Pain Orientation  Right    Pain Descriptors / Indicators  Aching    Pain Type  Chronic pain    Pain Onset  More than a month ago    Pain Frequency  Intermittent    Multiple Pain Sites  No    Pain Onset  More than a month ago  Neuromuscular Re-Education Sit to stands only with UE to assist with standing 5 x  Standing with cone stacking in different planes of motion with R arm 2.5 minutes with 3 minute break x 4 (pt. Limited from pain in low back and R knee) Standing fishing activity for improving standing balance and functional reaching capacity 3 minutes with 3 minute breaks x 2 (pt. Limited from pain in R knee and fatigue) High stepping with 2 second hold for balance with bilateral UE support in //-bars x 8 laps  TheraEx (no charge) NuStep 12 minutes level 4       PT Long Term Goals - 11/06/19 1434      PT LONG TERM GOAL #1   Title  Pt. independent with HEP to increase B hip flexion/ R quad muscle strength 1/2 muscle grade to improve pain-free mobility.    Baseline  R knee extension limited to 4/5 MMT secondary to pain/ fear of pain. 2/9: Deferred    Time  4    Period  Weeks    Status   Partially Met    Target Date  01/01/20      PT LONG TERM GOAL #2   Title  Pt. will increase Berg balance test to >40 out of 56 to improve independence with gait/ decrease fall risk.    Baseline  Berg: 22/56 (significant fall risk); 9/30 32/56.  11/10: 32 (limited by R knee pain)  2/9: 33/56 limited due to R knee and Low Back pain    Time  4    Period  Weeks    Status  Not Met    Target Date  01/01/20      PT LONG TERM GOAL #3   Title  Pt. able to ambulate 100 feet with consistent 2 point gait pattern and use of least restrictive assistive devce to improve household mobility.    Baseline  Pt. able to ambulate with use of RW/ CGA to min. A for safety.   Heavy use of B UE.; 9/30 pt able to ambulate with rollator with inconsistent 2 point gait pattern, heavy reliance on UE CGA/supervision  2/9: Pt. uses 2 point step pattern with walking with rollader with decreased stride length. Pt. would be limited due to pain in the knee walking for >100 ft.    Time  4    Period  Weeks    Status  Partially Met      PT LONG TERM GOAL #4   Title  Pt. able to stand from normal chair with no UE assist to improve safety/independence with transfers.     Baseline  Unable to stand from standard chair without heavy UE assist. 10/6, pt unable to stand from standard chair without UE support.  11/10: benefits from 1 UE assist    2/9: Pt. can rise from normal chair with 1 UE assist and must control descent with UE assist.    Time  4    Period  Weeks    Status  Partially Met    Target Date  01/01/20      PT LONG TERM GOAL #5   Title  Pt. will perform a sit to stand with 1 UE support to be able to perform toileting activities with more independance.    Baseline  Pt. requires 2 UE to push from to sit to stand from standard chair height. Pt. has grab rails available but can not get up by pulling.    Time  8  Period  Weeks    Status  New    Target Date  01/01/20            Plan - 11/20/19 1513    Clinical  Impression Statement  Pt. was limited due to pain in the R knee today but was able to stand consistently for 2.5 minutes before onset of pain. Pt. was educated to try to tolerate pain with activities at home up to a 3/10 NPS. Pt. showed improved R shoulder abduction/functional reaching with cones while standing for 2.5 minutes. Pt. requires 3 minute rest breaks between standing activities. Pt. utilized heavy UE assistance with hip flexion with 3 second holds in the //-bars. Pt. was able to maintain standing balance without UE support at the blue mat table with CGA/SBA from SPT.    Examination-Activity Limitations  Toileting;Stand;Squat;Lift;Stairs    Stability/Clinical Decision Making  Evolving/Moderate complexity    Clinical Decision Making  Moderate    Rehab Potential  Fair    PT Frequency  1x / week    PT Duration  8 weeks    PT Treatment/Interventions  Moist Heat;Functional mobility training;Therapeutic activities;Therapeutic exercise;Balance training;Patient/family education;Manual techniques;Passive range of motion;Neuromuscular re-education;Stair training;Gait training;ADLs/Self Care Home Management    PT Next Visit Plan  Decrease seating time to 2.5 minutes    PT Home Exercise Plan  [email or web address]    Consulted and Agree with Plan of Care  Patient;Family member/caregiver    Family Member Consulted  spouse: [name]       Patient will benefit from skilled therapeutic intervention in order to improve the following deficits and impairments:  Abnormal gait, Decreased endurance, Decreased activity tolerance, Pain, Decreased balance, Impaired flexibility, Decreased strength, Postural dysfunction  Visit Diagnosis: Muscle weakness (generalized)  Gait difficulty  Balance problem  Abnormal posture     Problem List Patient Active Problem List   Diagnosis Date Noted  . Lymphedema 04/15/2019  . Bilateral leg edema 01/25/2019  . Hyponatremia 12/24/2018  . SIADH (syndrome of inappropriate ADH  production) ([place]) 12/24/2018  . Erosion of urethra due to catheterization of urinary tract ([place]) 10/27/2018  . Generalized weakness 10/14/2018  . Hip fracture ([place]) 09/15/2018  . 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 HLD (hyperlipidemia) 03/24/2018  . Contusion of right knee 02/14/2018  . Bradycardia 12/28/2017  . 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 Chronic pain of right knee 08/10/2016  . Right ankle pain 08/10/2016  . 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  . 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[place]) 12/11/2015  . Urinary retention 09/08/2015  . Lymphoma, 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[place]) 04/03/2015  . Breathlessness on exertion 11/21/2014  . Breath shortness 11/21/2014  . Arthropathy 11/07/2014  . Atrial flutter, paroxysmal ([place]) 11/07/2014  . Type 2 diabetes mellitus ([place]) 11/07/2014  . Benign essential tremor 11/07/2014  . Benign essential HTN 11/07/2014  . Mixed incontinence 11/07/2014  . Hypercholesterolemia without hypertriglyceridemia 11/07/2014  . Apnea, sleep 11/07/2014  . Controlled type 2 diabetes mellitus without complication ([place]) 35/68/6168  . Pure hypercholesterolemia 11/07/2014  . Other abnormalities of gait and mobility 11/02/2011  . Decreased mobility 11/02/2011  . Abnormal gait 06/29/2011  . Discoordination 06/29/2011   Pura Spice, PT, DPT # [id number] Andrey Campanile, SPT 11/20/2019, 3:20 PM  [place] [facility] [address] [place], [place], [address] Phone: [phone]   Fax:  [phone]  Name: Mike Holloway MRN: [id number] Date of Birth: [birth year]

## 2019-11-21 ENCOUNTER — Encounter: Payer: Self-pay | Admitting: Physical Therapy

## 2019-11-22 ENCOUNTER — Encounter: Payer: Medicare Other | Admitting: Physical Therapy

## 2019-11-29 ENCOUNTER — Encounter: Payer: Self-pay | Admitting: Physical Therapy

## 2019-11-29 ENCOUNTER — Other Ambulatory Visit: Payer: Self-pay

## 2019-11-29 ENCOUNTER — Ambulatory Visit: Payer: Medicare Other | Attending: Orthopedic Surgery | Admitting: Physical Therapy

## 2019-11-29 DIAGNOSIS — R293 Abnormal posture: Secondary | ICD-10-CM | POA: Insufficient documentation

## 2019-11-29 DIAGNOSIS — R2689 Other abnormalities of gait and mobility: Secondary | ICD-10-CM | POA: Diagnosis present

## 2019-11-29 DIAGNOSIS — R269 Unspecified abnormalities of gait and mobility: Secondary | ICD-10-CM

## 2019-11-29 DIAGNOSIS — M6281 Muscle weakness (generalized): Secondary | ICD-10-CM | POA: Diagnosis not present

## 2019-11-29 NOTE — Therapy (Signed)
[facility] [address] [place], [place], [address] Phone: [phone]   Fax:  [phone]  Physical Therapy Treatment  Patient Details  Name: Mike Holloway MRN: [id number] Date of Birth: 23-May-1941 Referring Provider (PT): Dr. Rudene Christians   Encounter Date: 11/29/2019    Treatment: 63 of 71.  Recert date: 04/29/7289 2111 to 1412   Past Medical History:  Diagnosis Date  . Arthritis   . Atrial flutter ([place])   . Diabetes mellitus ([place])   . Essential tremor   . Essential tremor    deep brain stimulator   . Hypercholesteremia   . Hypertension   . Incontinence   . 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[place])    grew on the testical  . Sleep apnea   . Stroke [facility])     Past Surgical History:  Procedure Laterality Date  . ABLATION    . APPENDECTOMY    . CATARACT EXTRACTION, BILATERAL    . COLONOSCOPY WITH PROPOFOL N/A 03/17/2018   Procedure: COLONOSCOPY WITH PROPOFOL;  Surgeon: Jonathon Bellows, MD;  Location: [facility] ENDOSCOPY;  Service: Gastroenterology;  Laterality: N/A;  . DEEP BRAIN STIMULATOR PLACEMENT    . 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 HEMORRHOID SURGERY    . HERNIA REPAIR    . HIP FRACTURE SURGERY    . INTRAMEDULLARY (IM) NAIL INTERTROCHANTERIC Right 09/16/2018   Procedure: INTRAMEDULLARY (IM) NAIL INTERTROCHANTRIC;  Surgeon: Hessie Knows, MD;  Location: [facility] ORS;  Service: Orthopedics;  Laterality: Right;  . IR CATHETER TUBE CHANGE  11/22/2018  . NASAL SINUS SURGERY    . ORCHIECTOMY    . TONSILLECTOMY      There were no vitals filed for this visit.    Pt. reports no new issues. Pt. has continued with LE ex./ sit to stand from dining chairs.     TheraEx (no charge) NuStep 12 minutes L5 B LE only (warm-up)- consistent cadence/ no rest breaks.   Standing marching/ hip abduction/ partial lunges in //-bars  (heavy UE assist)- 10x2 each. Discussed daily activities/ importance of getting up and walking more often.   Neuromuscular Re-Education  Sit to stands only with UE to assist with standing 5x. 3" plinth step overs/ lateral walking with cone taps L/R.  4-square steps in //-bars with UE assist.   Standing functional reaching capacity 3 minutes with 3 minute breaks x 2 (pt. Limited from pain in R knee and fatigue) Walking out to car with use of rollator/ cuing to increase hip flexion and step pattern over thresholds.      PT Long Term Goals - 11/06/19 1434      PT LONG TERM GOAL #1   Title  Pt. independent with HEP to increase B hip flexion/ R quad muscle strength 1/2 muscle grade to improve pain-free mobility.    Baseline  R knee extension limited to 4/5 MMT secondary to pain/ fear of pain. 2/9: Deferred    Time  4    Period  Weeks    Status  Partially Met    Target Date  01/01/20      PT LONG TERM GOAL #2   Title  Pt. will increase Berg balance test to >40 out of 56 to improve independence with gait/ decrease fall risk.    Baseline  Berg: 22/56 (significant fall risk); 9/30 32/56.  11/10: 32 (limited by R knee pain)  2/9: 33/56 limited due to R knee and Low Back pain    Time  4    Period  Weeks    Status  Not Met    Target Date  01/01/20      PT LONG TERM GOAL #3   Title  Pt. able to ambulate 100 feet with consistent 2 point gait pattern and use of least restrictive assistive devce to improve household mobility.    Baseline  Pt. able to ambulate with use of RW/ CGA to min. A for safety.   Heavy use of B UE.; 9/30 pt able to ambulate with rollator with inconsistent 2 point gait pattern, heavy reliance on UE CGA/supervision  2/9: Pt. uses 2 point step pattern with walking with rollader with decreased stride length. Pt. would be limited due to pain in the knee walking for >100 ft.    Time  4    Period  Weeks    Status  Partially Met      PT LONG TERM GOAL #4   Title  Pt. able to  stand from normal chair with no UE assist to improve safety/independence with transfers.     Baseline  Unable to stand from standard chair without heavy UE assist. 10/6, pt unable to stand from standard chair without UE support.  11/10: benefits from 1 UE assist    2/9: Pt. can rise from normal chair with 1 UE assist and must control descent with UE assist.    Time  4    Period  Weeks    Status  Partially Met    Target Date  01/01/20      PT LONG TERM GOAL #5   Title  Pt. will perform a sit to stand with 1 UE support to be able to perform toileting activities with more independance.    Baseline  Pt. requires 2 UE to push from to sit to stand from standard chair height. Pt. has grab rails available but can not get up by pulling.    Time  8    Period  Weeks    Status  New    Target Date  01/01/20         Pt. able to tolerate standing/ ther.ex. in //-bars for longer periods of time. Rest breaks are still required due to increase back/ R knee pain. Pt. had no LOB during tx. session but requires constant UE assist in //-bars or rollator wtih most tasks. No change to HEP at this time and pt. instructed to focus on sit to stands/ walking endurance at home. Pt. advised to sit for limited periods of time to improve overall endurance/ mobility.      Patient will benefit from skilled therapeutic intervention in order to improve the following deficits and impairments:  Abnormal gait, Decreased endurance, Decreased activity tolerance, Pain, Decreased balance, Impaired flexibility, Decreased strength, Postural dysfunction  Visit Diagnosis: Muscle weakness (generalized)  Gait difficulty  Balance problem  Abnormal posture     Problem List Patient Active Problem List   Diagnosis Date Noted  . Lymphedema 04/15/2019  . Bilateral leg edema 01/25/2019  . Hyponatremia 12/24/2018  . SIADH (syndrome of inappropriate ADH production) ([place]) 12/24/2018  . Erosion of urethra due to catheterization of  urinary tract ([place]) 10/27/2018  . Generalized weakness 10/14/2018  . Hip fracture ([place]) 09/15/2018  . 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 HLD (hyperlipidemia) 03/24/2018  . Contusion  of right knee 02/14/2018  . Bradycardia 12/28/2017  . 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 Chronic pain of right knee 08/10/2016  . Right ankle pain 08/10/2016  . 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  . 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[place]) 12/11/2015  . Urinary retention 09/08/2015  . Lymphoma, 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[place]) 04/03/2015  . Breathlessness on exertion 11/21/2014  . Breath shortness 11/21/2014  . Arthropathy 11/07/2014  . Atrial flutter, paroxysmal ([place]) 11/07/2014  . Type 2 diabetes mellitus ([place]) 11/07/2014  . Benign essential tremor 11/07/2014  . Benign essential HTN 11/07/2014  . Mixed incontinence 11/07/2014  . Hypercholesterolemia without hypertriglyceridemia 11/07/2014  . Apnea, sleep 11/07/2014  . Controlled type 2 diabetes mellitus without complication ([place]) 98/24/2998  . Pure hypercholesterolemia 11/07/2014  . Other abnormalities of gait and mobility 11/02/2011  . Decreased mobility 11/02/2011  . Abnormal gait 06/29/2011  . Discoordination 06/29/2011   Pura Spice, PT, DPT # [phone] 12/03/2019, 1:19 PM  [place] [facility] [address] [place], [place], [address] Phone: [phone]   Fax:  [phone]  Name: Mike Holloway MRN: [id number] Date of Birth: 01/08/1941

## 2019-12-04 ENCOUNTER — Other Ambulatory Visit: Payer: Self-pay

## 2019-12-04 ENCOUNTER — Encounter: Payer: Self-pay | Admitting: Physical Therapy

## 2019-12-04 ENCOUNTER — Ambulatory Visit: Payer: Medicare Other | Admitting: Physical Therapy

## 2019-12-04 DIAGNOSIS — R269 Unspecified abnormalities of gait and mobility: Secondary | ICD-10-CM

## 2019-12-04 DIAGNOSIS — M6281 Muscle weakness (generalized): Secondary | ICD-10-CM

## 2019-12-04 DIAGNOSIS — R2689 Other abnormalities of gait and mobility: Secondary | ICD-10-CM

## 2019-12-04 DIAGNOSIS — R293 Abnormal posture: Secondary | ICD-10-CM

## 2019-12-04 NOTE — Therapy (Signed)
[place] [facility] [address]. [place], [place], [address] Phone: [phone]   Fax:  [phone]  Physical Therapy Treatment  Patient Details  Name: Mike Holloway MRN: [id number] Date of Birth: 02-06-1941 Referring Provider (PT): Dr. Rudene Christians   Encounter Date: 12/04/2019  PT End of Session - 12/04/19 1310    Visit Number  51    Number of Visits  55    Date for PT Re-Evaluation  01/01/20    Authorization - Visit Number  5    Authorization - Number of Visits  10    PT Start Time  5726    PT Stop Time  1411    PT Time Calculation (min)  69 min    Equipment Utilized During Treatment  Gait belt    Activity Tolerance  Patient tolerated treatment well;Patient limited by pain;Patient limited by fatigue    Behavior During Therapy  [facility] for tasks assessed/performed       Past Medical History:  Diagnosis Date  . Arthritis   . Atrial flutter ([place])   . Diabetes mellitus ([place])   . Essential tremor   . Essential tremor    deep brain stimulator   . Hypercholesteremia   . Hypertension   . Incontinence   . 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[place])    grew on the testical  . Sleep apnea   . Stroke [facility])     Past Surgical History:  Procedure Laterality Date  . ABLATION    . APPENDECTOMY    . CATARACT EXTRACTION, BILATERAL    . COLONOSCOPY WITH PROPOFOL N/A 03/17/2018   Procedure: COLONOSCOPY WITH PROPOFOL;  Surgeon: Jonathon Bellows, MD;  Location: [facility] ENDOSCOPY;  Service: Gastroenterology;  Laterality: N/A;  . DEEP BRAIN STIMULATOR PLACEMENT    . 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 HEMORRHOID SURGERY    . HERNIA REPAIR    . HIP FRACTURE SURGERY    . INTRAMEDULLARY (IM) NAIL INTERTROCHANTERIC Right 09/16/2018   Procedure: INTRAMEDULLARY (IM) NAIL INTERTROCHANTRIC;  Surgeon: Hessie Knows, MD;  Location: [facility] ORS;  Service:  Orthopedics;  Laterality: Right;  . IR CATHETER TUBE CHANGE  11/22/2018  . NASAL SINUS SURGERY    . ORCHIECTOMY    . TONSILLECTOMY      There were no vitals filed for this visit.  Subjective Assessment - 12/04/19 1303    Subjective  Pt. reports no new issues.  Pt. has birthday on [birth year].  Pt. reports minimal pain in R LE upon walking into PT clinic.    Patient is accompained by:  Family member    Pertinent History  Pt. states R knee is bothering him but it is the typical discomfort he experiences    Limitations  House hold activities;Walking;Standing;Lifting    Patient Stated Goals  Improve LE strength/ gait and balance with daily tasks.  Improve gait/ safety/ progression to least assistive device.    Currently in Pain?  Yes    Pain Score  1     Pain Location  Knee    Pain Orientation  Right    Pain Descriptors / Indicators  Aching    Pain Onset  More than a month ago    Pain Onset  More than a month ago         TheraEx.:  NuStep 12 minutes L6B LE only (warm-up)- consistent cadence/ no rest breaks. Standing  marching/ hip abduction/ partial lunges in //-bars (heavy UE assist)- 10x2 each. Seated B shoulder flexion/ abduction 10x2.  Discussed UE ex. Program at home.    Neuromuscular Re-Education:  Sit to stands only with UE to assist with standing 15x.  Sit disc posture/stability.   3" plinth step overs/ lateral walking with cone taps L/R.  4-square steps in //-bars with UE assist.   Standing fishing activity: 6.5 minutes (longest standing time). Walking out to car with use of rollator/ cuing to increase hip flexion and step pattern over thresholds.      PT Long Term Goals - 11/06/19 1434      PT LONG TERM GOAL #1   Title  Pt. independent with HEP to increase B hip flexion/ R quad muscle strength 1/2 muscle grade to improve pain-free mobility.    Baseline  R knee extension limited to 4/5 MMT secondary to pain/ fear of pain. 2/9: Deferred    Time  4    Period  Weeks     Status  Partially Met    Target Date  01/01/20      PT LONG TERM GOAL #2   Title  Pt. will increase Berg balance test to >40 out of 56 to improve independence with gait/ decrease fall risk.    Baseline  Berg: 22/56 (significant fall risk); 9/30 32/56.  11/10: 32 (limited by R knee pain)  2/9: 33/56 limited due to R knee and Low Back pain    Time  4    Period  Weeks    Status  Not Met    Target Date  01/01/20      PT LONG TERM GOAL #3   Title  Pt. able to ambulate 100 feet with consistent 2 point gait pattern and use of least restrictive assistive devce to improve household mobility.    Baseline  Pt. able to ambulate with use of RW/ CGA to min. A for safety.   Heavy use of B UE.; 9/30 pt able to ambulate with rollator with inconsistent 2 point gait pattern, heavy reliance on UE CGA/supervision  2/9: Pt. uses 2 point step pattern with walking with rollader with decreased stride length. Pt. would be limited due to pain in the knee walking for >100 ft.    Time  4    Period  Weeks    Status  Partially Met      PT LONG TERM GOAL #4   Title  Pt. able to stand from normal chair with no UE assist to improve safety/independence with transfers.     Baseline  Unable to stand from standard chair without heavy UE assist. 10/6, pt unable to stand from standard chair without UE support.  11/10: benefits from 1 UE assist    2/9: Pt. can rise from normal chair with 1 UE assist and must control descent with UE assist.    Time  4    Period  Weeks    Status  Partially Met    Target Date  01/01/20      PT LONG TERM GOAL #5   Title  Pt. will perform a sit to stand with 1 UE support to be able to perform toileting activities with more independance.    Baseline  Pt. requires 2 UE to push from to sit to stand from standard chair height. Pt. has grab rails available but can not get up by pulling.    Time  8    Period  Weeks  Status  New    Target Date  01/01/20            Plan - 12/05/19 1918     Clinical Impression Statement  Pt. able to stand from 6.5 minutes without UE assist while completing dynamic balance/ fishing tasks.  Pt. required seated rest break due to LE/ back pain.  Pt. had difficulty controlling return to seated position due to fatigue.  No falls during tx. but benefits from SBA/CGA for safety.  Pt. required extra time to walking out to car after tx. with short stride length and heavy UE assist on rollator.    Examination-Activity Limitations  Toileting;Stand;Squat;Lift;Stairs    Stability/Clinical Decision Making  Evolving/Moderate complexity    Clinical Decision Making  Moderate    Rehab Potential  Fair    PT Frequency  1x / week    PT Duration  8 weeks    PT Treatment/Interventions  Moist Heat;Functional mobility training;Therapeutic activities;Therapeutic exercise;Balance training;Patient/family education;Manual techniques;Passive range of motion;Neuromuscular re-education;Stair training;Gait training;ADLs/Self Care Home Management    PT Next Visit Plan  Decrease sitting time/ increase standing and walking tolerance.   Dynamic balance tasks.    PT Home Exercise Plan  [email or web address]    Consulted and Agree with Plan of Care  Patient;Family member/caregiver    Family Member Consulted  spouse: [name]       Patient will benefit from skilled therapeutic intervention in order to improve the following deficits and impairments:  Abnormal gait, Decreased endurance, Decreased activity tolerance, Pain, Decreased balance, Impaired flexibility, Decreased strength, Postural dysfunction  Visit Diagnosis: Muscle weakness (generalized)  Gait difficulty  Balance problem  Abnormal posture     Problem List Patient Active Problem List   Diagnosis Date Noted  . Lymphedema 04/15/2019  . Bilateral leg edema 01/25/2019  . Hyponatremia 12/24/2018  . SIADH (syndrome of inappropriate ADH production) ([place]) 12/24/2018  . Erosion of urethra due to catheterization of urinary tract ([place])  10/27/2018  . Generalized weakness 10/14/2018  . Hip fracture ([place]) 09/15/2018  . 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 HLD (hyperlipidemia) 03/24/2018  . Contusion of right knee 02/14/2018  . Bradycardia 12/28/2017  . 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 Chronic pain of right knee 08/10/2016  . Right ankle pain 08/10/2016  . 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  . 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[place]) 12/11/2015  . Urinary retention 09/08/2015  . Lymphoma, 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[place]) 04/03/2015  . Breathlessness on exertion 11/21/2014  . Breath shortness 11/21/2014  . Arthropathy 11/07/2014  . Atrial flutter, paroxysmal ([place]) 11/07/2014  . Type 2 diabetes mellitus ([place]) 11/07/2014  . Benign essential tremor 11/07/2014  . Benign essential HTN 11/07/2014  . Mixed incontinence 11/07/2014  . Hypercholesterolemia without hypertriglyceridemia 11/07/2014  . Apnea, sleep 11/07/2014  . Controlled type 2 diabetes mellitus without complication ([place]) 26/83/4196  . Pure hypercholesterolemia 11/07/2014  . Other abnormalities of gait and mobility 11/02/2011  . Decreased mobility 11/02/2011  . Abnormal gait 06/29/2011  . Discoordination 06/29/2011   Pura Spice, PT, DPT # [phone] 12/05/2019, 7:30 PM  [place] [facility] [address] [place], [place], [address] Phone: [phone]   Fax:  [phone]  Name: Mike Holloway MRN: [id number] Date of Birth: 09-11-1941

## 2019-12-06 ENCOUNTER — Encounter: Payer: Medicare Other | Admitting: Physical Therapy

## 2019-12-11 ENCOUNTER — Encounter: Payer: Self-pay | Admitting: Physical Therapy

## 2019-12-11 ENCOUNTER — Ambulatory Visit: Payer: Medicare Other | Admitting: Physical Therapy

## 2019-12-11 ENCOUNTER — Other Ambulatory Visit: Payer: Self-pay

## 2019-12-11 DIAGNOSIS — R2689 Other abnormalities of gait and mobility: Secondary | ICD-10-CM

## 2019-12-11 DIAGNOSIS — M6281 Muscle weakness (generalized): Secondary | ICD-10-CM | POA: Diagnosis not present

## 2019-12-11 DIAGNOSIS — R269 Unspecified abnormalities of gait and mobility: Secondary | ICD-10-CM

## 2019-12-11 DIAGNOSIS — R293 Abnormal posture: Secondary | ICD-10-CM

## 2019-12-11 NOTE — Therapy (Signed)
[facility] [address] [place], [place], [address] Phone: [phone]   Fax:  [phone]  Physical Therapy Treatment  Patient Details  Name: Mike Holloway MRN: [id number] Date of Birth: [birth year] Referring Provider (PT): Dr. Rudene Christians   Encounter Date: 12/11/2019  PT End of Session - 12/11/19 1313    Visit Number  24    Number of Visits  55    Date for PT Re-Evaluation  01/01/20    Authorization - Visit Number  6    Authorization - Number of Visits  10    PT Start Time  1916    PT Stop Time  1402    PT Time Calculation (min)  60 min    Equipment Utilized During Treatment  Gait belt    Activity Tolerance  Patient tolerated treatment well;Patient limited by pain;Patient limited by fatigue    Behavior During Therapy  [facility] for tasks assessed/performed       Past Medical History:  Diagnosis Date  . Arthritis   . Atrial flutter ([place])   . Diabetes mellitus ([place])   . Essential tremor   . Essential tremor    deep brain stimulator   . Hypercholesteremia   . Hypertension   . Incontinence   . 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[place])    grew on the testical  . Sleep apnea   . Stroke [facility])     Past Surgical History:  Procedure Laterality Date  . ABLATION    . APPENDECTOMY    . CATARACT EXTRACTION, BILATERAL    . COLONOSCOPY WITH PROPOFOL N/A 03/17/2018   Procedure: COLONOSCOPY WITH PROPOFOL;  Surgeon: Jonathon Bellows, MD;  Location: [facility] ENDOSCOPY;  Service: Gastroenterology;  Laterality: N/A;  . DEEP BRAIN STIMULATOR PLACEMENT    . 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 HEMORRHOID SURGERY    . HERNIA REPAIR    . HIP FRACTURE SURGERY    . INTRAMEDULLARY (IM) NAIL INTERTROCHANTERIC Right 09/16/2018   Procedure: INTRAMEDULLARY (IM) NAIL INTERTROCHANTRIC;  Surgeon: Hessie Knows, MD;  Location: [facility] ORS;  Service:  Orthopedics;  Laterality: Right;  . IR CATHETER TUBE CHANGE  11/22/2018  . NASAL SINUS SURGERY    . ORCHIECTOMY    . TONSILLECTOMY      There were no vitals filed for this visit.  Subjective Assessment - 12/11/19 1312    Subjective  Pt. reports no new complaints.  Pt. schedulde for [employer] vaccine this week.    Patient is accompained by:  Family member    Pertinent History  Pt. states R knee is bothering him but it is the typical discomfort he experiences    Limitations  House hold activities;Walking;Standing;Lifting    Patient Stated Goals  Improve LE strength/ gait and balance with daily tasks.  Improve gait/ safety/ progression to least assistive device.    Currently in Pain?  Yes    Pain Score  2     Pain Location  Knee    Pain Orientation  Right    Pain Onset  More than a month ago    Pain Onset  More than a month ago         TheraEx.:  Standing step touches L/R 12x each with UE assist in //-bars Standing ex.: B shoulder flexion/ abduction/ IR/ extension with wand (increase low back fatigue/ discomfort after 10 reps. Each).  Standing marching/ hip abduction/ partial lunges in //-bars (heavy UE assist)- 10x2 each. NuStep 12 minutesL6B LEonly(warm-up)- consistent cadence/ no rest breaks.  Neuromuscular Re-Education:  Sit to stands only with UE to assist with standing 10x.  Sit disc posture/stability.   Standing/walking posture correction in //-bars with mirror feedback.   3" plinth step overs/ cone taps L/R. Completed in //-bars  Walking out to car with use of rollator/ cuing to increase hip flexion and step pattern over thresholds.    PT Long Term Goals - 11/06/19 1434      PT LONG TERM GOAL #1   Title  Pt. independent with HEP to increase B hip flexion/ R quad muscle strength 1/2 muscle grade to improve pain-free mobility.    Baseline  R knee extension limited to 4/5 MMT secondary to pain/ fear of pain. 2/9: Deferred    Time  4    Period   Weeks    Status  Partially Met    Target Date  01/01/20      PT LONG TERM GOAL #2   Title  Pt. will increase Berg balance test to >40 out of 56 to improve independence with gait/ decrease fall risk.    Baseline  Berg: 22/56 (significant fall risk); 9/30 32/56.  11/10: 32 (limited by R knee pain)  2/9: 33/56 limited due to R knee and Low Back pain    Time  4    Period  Weeks    Status  Not Met    Target Date  01/01/20      PT LONG TERM GOAL #3   Title  Pt. able to ambulate 100 feet with consistent 2 point gait pattern and use of least restrictive assistive devce to improve household mobility.    Baseline  Pt. able to ambulate with use of RW/ CGA to min. A for safety.   Heavy use of B UE.; 9/30 pt able to ambulate with rollator with inconsistent 2 point gait pattern, heavy reliance on UE CGA/supervision  2/9: Pt. uses 2 point step pattern with walking with rollader with decreased stride length. Pt. would be limited due to pain in the knee walking for >100 ft.    Time  4    Period  Weeks    Status  Partially Met      PT LONG TERM GOAL #4   Title  Pt. able to stand from normal chair with no UE assist to improve safety/independence with transfers.     Baseline  Unable to stand from standard chair without heavy UE assist. 10/6, pt unable to stand from standard chair without UE support.  11/10: benefits from 1 UE assist    2/9: Pt. can rise from normal chair with 1 UE assist and must control descent with UE assist.    Time  4    Period  Weeks    Status  Partially Met    Target Date  01/01/20      PT LONG TERM GOAL #5   Title  Pt. will perform a sit to stand with 1 UE support to be able to perform toileting activities with more independance.    Baseline  Pt. requires 2 UE to push from to sit to stand from standard chair height. Pt. has grab rails available but can not get up by pulling.    Time  8    Period  Weeks    Status  New    Target Date  01/01/20  Plan - 12/11/19  1313    Clinical Impression Statement  Pt. tolerated standing around 5 minutes at //-bars without UE assist while completing UE wand ex.  Pt. requested to sit due to increase back pain, not knee.  Good LE step pattern/ hip control during obstacle course in //-bars.  Pt. continues to require heavy UE assist with all walking/ turning tasks.  Pt./ wife instructed to focus on sit to stands from commode prior to next tx. session.    Examination-Activity Limitations  Toileting;Stand;Squat;Lift;Stairs    Stability/Clinical Decision Making  Evolving/Moderate complexity    Clinical Decision Making  Moderate    Rehab Potential  Fair    PT Frequency  1x / week    PT Duration  8 weeks    PT Treatment/Interventions  Moist Heat;Functional mobility training;Therapeutic activities;Therapeutic exercise;Balance training;Patient/family education;Manual techniques;Passive range of motion;Neuromuscular re-education;Stair training;Gait training;ADLs/Self Care Home Management    PT Next Visit Plan  Decrease sitting time/ increase standing and walking tolerance.   Dynamic balance tasks.    PT Home Exercise Plan  [email or web address]    Consulted and Agree with Plan of Care  Patient;Family member/caregiver    Family Member Consulted  spouse: [name]       Patient will benefit from skilled therapeutic intervention in order to improve the following deficits and impairments:  Abnormal gait, Decreased endurance, Decreased activity tolerance, Pain, Decreased balance, Impaired flexibility, Decreased strength, Postural dysfunction  Visit Diagnosis: Muscle weakness (generalized)  Gait difficulty  Balance problem  Abnormal posture     Problem List Patient Active Problem List   Diagnosis Date Noted  . Lymphedema 04/15/2019  . Bilateral leg edema 01/25/2019  . Hyponatremia 12/24/2018  . SIADH (syndrome of inappropriate ADH production) ([place]) 12/24/2018  . Erosion of urethra due to catheterization of urinary tract ([place]) 10/27/2018   . Generalized weakness 10/14/2018  . Hip fracture ([place]) 09/15/2018  . 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 HLD (hyperlipidemia) 03/24/2018  . Contusion of right knee 02/14/2018  . Bradycardia 12/28/2017  . 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 Chronic pain of right knee 08/10/2016  . Right ankle pain 08/10/2016  . 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  . 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[place]) 12/11/2015  . Urinary retention 09/08/2015  . Lymphoma, 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[place]) 04/03/2015  . Breathlessness on exertion 11/21/2014  . Breath shortness 11/21/2014  . Arthropathy 11/07/2014  . Atrial flutter, paroxysmal ([place]) 11/07/2014  . Type 2 diabetes mellitus ([place]) 11/07/2014  . Benign essential tremor 11/07/2014  . Benign essential HTN 11/07/2014  . Mixed incontinence 11/07/2014  . Hypercholesterolemia without hypertriglyceridemia 11/07/2014  . Apnea, sleep 11/07/2014  . Controlled type 2 diabetes mellitus without complication ([place]) 21/22/4825  . Pure hypercholesterolemia 11/07/2014  . Other abnormalities of gait and mobility 11/02/2011  . Decreased mobility 11/02/2011  . Abnormal gait 06/29/2011  . Discoordination 06/29/2011   Pura Spice, PT, DPT # [id number] 12/11/2019, 2:49 PM   [facility] [address] [place], [place], [address] Phone: [phone]   Fax:  [phone]  Name: Mike Holloway MRN: [id number] Date of Birth: 1941-04-12

## 2019-12-13 ENCOUNTER — Encounter: Payer: Medicare Other | Admitting: Physical Therapy

## 2019-12-18 ENCOUNTER — Other Ambulatory Visit: Payer: Self-pay

## 2019-12-18 ENCOUNTER — Ambulatory Visit: Payer: Medicare Other | Admitting: Physical Therapy

## 2019-12-18 ENCOUNTER — Encounter: Payer: Self-pay | Admitting: Physical Therapy

## 2019-12-18 DIAGNOSIS — M6281 Muscle weakness (generalized): Secondary | ICD-10-CM

## 2019-12-18 DIAGNOSIS — R269 Unspecified abnormalities of gait and mobility: Secondary | ICD-10-CM

## 2019-12-18 DIAGNOSIS — R293 Abnormal posture: Secondary | ICD-10-CM

## 2019-12-18 DIAGNOSIS — R2689 Other abnormalities of gait and mobility: Secondary | ICD-10-CM

## 2019-12-18 NOTE — Therapy (Signed)
[facility] [address] [place], [place], [address] Phone: [phone]   Fax:  [phone]  Physical Therapy Treatment  Patient Details  Name: Mike Holloway MRN: [id number] Date of Birth: 1941/09/15 Referring Provider (PT): Dr. Rudene Christians   Encounter Date: 12/18/2019  PT End of Session - 12/18/19 1236    Visit Number  49    Number of Visits  55    Date for PT Re-Evaluation  01/01/20    Authorization - Visit Number  7    Authorization - Number of Visits  10    PT Start Time  1194    PT Stop Time  1740    PT Time Calculation (min)  58 min    Equipment Utilized During Treatment  Gait belt    Activity Tolerance  Patient tolerated treatment well;Patient limited by pain;Patient limited by fatigue    Behavior During Therapy  [facility] for tasks assessed/performed       Past Medical History:  Diagnosis Date  . Arthritis   . Atrial flutter ([place])   . Diabetes mellitus ([place])   . Essential tremor   . Essential tremor    deep brain stimulator   . Hypercholesteremia   . Hypertension   . Incontinence   . 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 Sleep apnea   . Stroke [facility])     Past Surgical History:  Procedure Laterality Date  . ABLATION    . APPENDECTOMY    . CATARACT EXTRACTION, BILATERAL    . COLONOSCOPY WITH PROPOFOL N/A 03/17/2018   Procedure: COLONOSCOPY WITH PROPOFOL;  Surgeon: Jonathon Bellows, MD;  Location: [facility] ENDOSCOPY;  Service: Gastroenterology;  Laterality: N/A;  . DEEP BRAIN STIMULATOR PLACEMENT    . 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 HEMORRHOID SURGERY    . HERNIA REPAIR    . HIP FRACTURE SURGERY    . INTRAMEDULLARY (IM) NAIL INTERTROCHANTERIC Right 09/16/2018   Procedure: INTRAMEDULLARY (IM) NAIL INTERTROCHANTRIC;  Surgeon: Hessie Knows, MD;  Location: [facility] ORS;  Service:  Orthopedics;  Laterality: Right;  . IR CATHETER TUBE CHANGE  11/22/2018  . NASAL SINUS SURGERY    . ORCHIECTOMY    . TONSILLECTOMY      There were no vitals filed for this visit.  Subjective Assessment - 12/18/19 1235    Subjective  Pt. states he is doing well.  Pt. did not get the vaccine yet and is still looking for facility that administers the [employer] vaccine.    Patient is accompained by:  Family member    Pertinent History  Pt. states R knee is bothering him but it is the typical discomfort he experiences    Limitations  House hold activities;Walking;Standing;Lifting    Patient Stated Goals  Improve LE strength/ gait and balance with daily tasks.  Improve gait/ safety/ progression to least assistive device.    Currently in Pain?  Yes    Pain Score  2     Pain Location  Knee    Pain Orientation  Right    Pain Onset  More than a month ago    Pain Onset  More than a month ago          TheraEx.:  NuStep 12 minutesL6B LEonly(warm-up)- consistent cadence/ no rest breaks. Seated/standing 4# ex.: marching/ LAQ/ forward and backwards walking (//-  bars)/ wt. Shifting/ 3" plinth touches 10x2 each. Seated/standing ex.: B shoulder flexion/ abduction/ IR/ extension with wand. Standing L/R lunges (forward/ backwards)- 10x in //-bars   Neuromuscular Re-Education:  Sit to stands with UE to assist with standing10x (blue mat) Standing/walking posture correction in //-bars with mirror feedback.   Walking at agility ladder working on 16" stride length (recip. Pattern/ consistent) with RW.   Walking out to car with use of rollator/ cuing to increase hip flexion and step pattern over thresholds.    PT Long Term Goals - 11/06/19 1434      PT LONG TERM GOAL #1   Title  Pt. independent with HEP to increase B hip flexion/ R quad muscle strength 1/2 muscle grade to improve pain-free mobility.    Baseline  R knee extension limited to 4/5 MMT secondary to pain/ fear of  pain. 2/9: Deferred    Time  4    Period  Weeks    Status  Partially Met    Target Date  01/01/20      PT LONG TERM GOAL #2   Title  Pt. will increase Berg balance test to >40 out of 56 to improve independence with gait/ decrease fall risk.    Baseline  Berg: 22/56 (significant fall risk); 9/30 32/56.  11/10: 32 (limited by R knee pain)  2/9: 33/56 limited due to R knee and Low Back pain    Time  4    Period  Weeks    Status  Not Met    Target Date  01/01/20      PT LONG TERM GOAL #3   Title  Pt. able to ambulate 100 feet with consistent 2 point gait pattern and use of least restrictive assistive devce to improve household mobility.    Baseline  Pt. able to ambulate with use of RW/ CGA to min. A for safety.   Heavy use of B UE.; 9/30 pt able to ambulate with rollator with inconsistent 2 point gait pattern, heavy reliance on UE CGA/supervision  2/9: Pt. uses 2 point step pattern with walking with rollader with decreased stride length. Pt. would be limited due to pain in the knee walking for >100 ft.    Time  4    Period  Weeks    Status  Partially Met      PT LONG TERM GOAL #4   Title  Pt. able to stand from normal chair with no UE assist to improve safety/independence with transfers.     Baseline  Unable to stand from standard chair without heavy UE assist. 10/6, pt unable to stand from standard chair without UE support.  11/10: benefits from 1 UE assist    2/9: Pt. can rise from normal chair with 1 UE assist and must control descent with UE assist.    Time  4    Period  Weeks    Status  Partially Met    Target Date  01/01/20      PT LONG TERM GOAL #5   Title  Pt. will perform a sit to stand with 1 UE support to be able to perform toileting activities with more independance.    Baseline  Pt. requires 2 UE to push from to sit to stand from standard chair height. Pt. has grab rails available but can not get up by pulling.    Time  8    Period  Weeks    Status  New    Target Date  01/01/20            Plan - 12/18/19 1236    Clinical Impression Statement  Standing tolerance for 5 minutes x 3 in //-bars during UE ex./ reaching tasks.  Pt. limited by R knee/back pain and generalized muscle fatigue.  PT instructed pt. in posture/ wt. shifting to L to decrease pain/ increase standing endurance.  Pt. more consistent in step pattern/ length while walking in //-bars but fatigued after standing tasks.  No LOB during tx. and MH to back during Nustep due to c/o pain.  Good functional B shoulder AROM during wand ex./ overhead head reaching.    Examination-Activity Limitations  Toileting;Stand;Squat;Lift;Stairs    Stability/Clinical Decision Making  Evolving/Moderate complexity    Clinical Decision Making  Moderate    Rehab Potential  Fair    PT Frequency  1x / week    PT Duration  8 weeks    PT Treatment/Interventions  Moist Heat;Functional mobility training;Therapeutic activities;Therapeutic exercise;Balance training;Patient/family education;Manual techniques;Passive range of motion;Neuromuscular re-education;Stair training;Gait training;ADLs/Self Care Home Management    PT Next Visit Plan  Decrease sitting time/ increase standing and walking tolerance.   Dynamic balance tasks.    PT Home Exercise Plan  [email or web address]    Consulted and Agree with Plan of Care  Patient;Family member/caregiver    Family Member Consulted  spouse: [name]       Patient will benefit from skilled therapeutic intervention in order to improve the following deficits and impairments:  Abnormal gait, Decreased endurance, Decreased activity tolerance, Pain, Decreased balance, Impaired flexibility, Decreased strength, Postural dysfunction  Visit Diagnosis: Muscle weakness (generalized)  Gait difficulty  Balance problem  Abnormal posture     Problem List Patient Active Problem List   Diagnosis Date Noted  . Lymphedema 04/15/2019  . Bilateral leg edema 01/25/2019  . Hyponatremia 12/24/2018  . SIADH  (syndrome of inappropriate ADH production) ([place]) 12/24/2018  . Erosion of urethra due to catheterization of urinary tract ([place]) 10/27/2018  . Generalized weakness 10/14/2018  . Hip fracture ([place]) 09/15/2018  . 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 HLD (hyperlipidemia) 03/24/2018  . Contusion of right knee 02/14/2018  . Bradycardia 12/28/2017  . 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 Chronic pain of right knee 08/10/2016  . Right ankle pain 08/10/2016  . 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  . 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[place]) 12/11/2015  . Urinary retention 09/08/2015  . Lymphoma, 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[place]) 04/03/2015  . Breathlessness on exertion 11/21/2014  . Breath shortness 11/21/2014  . Arthropathy 11/07/2014  . Atrial flutter, paroxysmal ([place]) 11/07/2014  . Type 2 diabetes mellitus ([place]) 11/07/2014  . Benign essential tremor 11/07/2014  . Benign essential HTN 11/07/2014  . Mixed incontinence 11/07/2014  . Hypercholesterolemia without hypertriglyceridemia 11/07/2014  . Apnea, sleep 11/07/2014  . Controlled type 2 diabetes mellitus without complication ([place]) 82/42/3536  . Pure hypercholesterolemia 11/07/2014  . Other abnormalities of gait and mobility 11/02/2011  . Decreased mobility 11/02/2011  . Abnormal gait 06/29/2011  . Discoordination 06/29/2011   Pura Spice, PT, DPT # [phone] 12/19/2019, 7:49 PM  [facility] [address] [place], [place], [address] Phone: [phone]   Fax:  [phone]  Name: Fordyce Lepak MRN: [id number] Date of Birth: Feb 23, 1941

## 2019-12-20 ENCOUNTER — Encounter: Payer: Medicare Other | Admitting: Physical Therapy

## 2019-12-25 ENCOUNTER — Encounter: Payer: Self-pay | Admitting: Physical Therapy

## 2019-12-25 ENCOUNTER — Ambulatory Visit: Payer: Medicare Other | Admitting: Physical Therapy

## 2019-12-25 ENCOUNTER — Other Ambulatory Visit: Payer: Self-pay

## 2019-12-25 DIAGNOSIS — R269 Unspecified abnormalities of gait and mobility: Secondary | ICD-10-CM

## 2019-12-25 DIAGNOSIS — R293 Abnormal posture: Secondary | ICD-10-CM

## 2019-12-25 DIAGNOSIS — M6281 Muscle weakness (generalized): Secondary | ICD-10-CM

## 2019-12-25 DIAGNOSIS — R2689 Other abnormalities of gait and mobility: Secondary | ICD-10-CM

## 2019-12-25 NOTE — Therapy (Signed)
[facility] [address] [place], [place], [address] Phone: [phone]   Fax:  [phone]  Physical Therapy Treatment  Patient Details  Name: Mike Holloway MRN: [id number] Date of Birth: April 27, 1941 Referring Provider (PT): Dr. Rudene Christians   Encounter Date: 12/25/2019  PT End of Session - 12/25/19 1256    Visit Number  55    Number of Visits  55    Date for PT Re-Evaluation  01/01/20    Authorization - Visit Number  8    Authorization - Number of Visits  10    PT Start Time  3818    PT Stop Time  1356    PT Time Calculation (min)  60 min    Equipment Utilized During Treatment  Gait belt    Activity Tolerance  Patient tolerated treatment well;Patient limited by pain;Patient limited by fatigue    Behavior During Therapy  [facility] for tasks assessed/performed       Past Medical History:  Diagnosis Date  . Arthritis   . Atrial flutter ([place])   . Diabetes mellitus ([place])   . Essential tremor   . Essential tremor    deep brain stimulator   . Hypercholesteremia   . Hypertension   . Incontinence   . 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 Sleep apnea   . Stroke [facility])     Past Surgical History:  Procedure Laterality Date  . ABLATION    . APPENDECTOMY    . CATARACT EXTRACTION, BILATERAL    . COLONOSCOPY WITH PROPOFOL N/A 03/17/2018   Procedure: COLONOSCOPY WITH PROPOFOL;  Surgeon: Jonathon Bellows, MD;  Location: [facility] ENDOSCOPY;  Service: Gastroenterology;  Laterality: N/A;  . DEEP BRAIN STIMULATOR PLACEMENT    . 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 HEMORRHOID SURGERY    . HERNIA REPAIR    . HIP FRACTURE SURGERY    . INTRAMEDULLARY (IM) NAIL INTERTROCHANTERIC Right 09/16/2018   Procedure: INTRAMEDULLARY (IM) NAIL INTERTROCHANTRIC;  Surgeon: Hessie Knows, MD;  Location: [facility] ORS;  Service:  Orthopedics;  Laterality: Right;  . IR CATHETER TUBE CHANGE  11/22/2018  . NASAL SINUS SURGERY    . ORCHIECTOMY    . TONSILLECTOMY      There were no vitals filed for this visit.  Subjective Assessment - 12/25/19 1257    Subjective  Pt. had a nice week visiting with son/family.  No falls or LOB.  Pt. able to use commode while home alone for 2nd time without wife assist.    Patient is accompained by:  Family member    Pertinent History  Pt. states R knee is bothering him but it is the typical discomfort he experiences    Limitations  House hold activities;Walking;Standing;Lifting    Patient Stated Goals  Improve LE strength/ gait and balance with daily tasks.  Improve gait/ safety/ progression to least assistive device.    Currently in Pain?  Yes    Pain Score  2     Pain Location  Knee    Pain Orientation  Right    Pain Onset  More than a month ago    Pain Onset  More than a month ago         TheraEx.:  Seated/standing 4# ex.: marching/ LAQ/ 3" plinth touches 10x2 each.  Walking with 4# ankle wt. Over green  hurdles with UE assist 2x.  Lateral walking with 4# ankle wt. 2x in //-bars. Standing ex.: B shoulder flexion/ abduction/ IR/ extension with wand. NuStep 12 minutesL6B LEonly(warm-up)- consistent cadence/ no rest breaks.  Neuromuscular Re-Education:  Walking in //-bars 4x with UE assist and green hurdle step overs (9 min. Before 1st rest break).   Sit to stands with UE to assist with standing10x (blue mat) Walking at agility ladder working on 16" stride length (recip. Pattern/ consistent) with RW.   Walking out to car with use of rollator/ cuing to increase hip flexion and step pattern over thresholds.    PT Long Term Goals - 11/06/19 1434      PT LONG TERM GOAL #1   Title  Pt. independent with HEP to increase B hip flexion/ R quad muscle strength 1/2 muscle grade to improve pain-free mobility.    Baseline  R knee extension limited to 4/5 MMT secondary to  pain/ fear of pain. 2/9: Deferred    Time  4    Period  Weeks    Status  Partially Met    Target Date  01/01/20      PT LONG TERM GOAL #2   Title  Pt. will increase Berg balance test to >40 out of 56 to improve independence with gait/ decrease fall risk.    Baseline  Berg: 22/56 (significant fall risk); 9/30 32/56.  11/10: 32 (limited by R knee pain)  2/9: 33/56 limited due to R knee and Low Back pain    Time  4    Period  Weeks    Status  Not Met    Target Date  01/01/20      PT LONG TERM GOAL #3   Title  Pt. able to ambulate 100 feet with consistent 2 point gait pattern and use of least restrictive assistive devce to improve household mobility.    Baseline  Pt. able to ambulate with use of RW/ CGA to min. A for safety.   Heavy use of B UE.; 9/30 pt able to ambulate with rollator with inconsistent 2 point gait pattern, heavy reliance on UE CGA/supervision  2/9: Pt. uses 2 point step pattern with walking with rollader with decreased stride length. Pt. would be limited due to pain in the knee walking for >100 ft.    Time  4    Period  Weeks    Status  Partially Met      PT LONG TERM GOAL #4   Title  Pt. able to stand from normal chair with no UE assist to improve safety/independence with transfers.     Baseline  Unable to stand from standard chair without heavy UE assist. 10/6, pt unable to stand from standard chair without UE support.  11/10: benefits from 1 UE assist    2/9: Pt. can rise from normal chair with 1 UE assist and must control descent with UE assist.    Time  4    Period  Weeks    Status  Partially Met    Target Date  01/01/20      PT LONG TERM GOAL #5   Title  Pt. will perform a sit to stand with 1 UE support to be able to perform toileting activities with more independance.    Baseline  Pt. requires 2 UE to push from to sit to stand from standard chair height. Pt. has grab rails available but can not get up by pulling.    Time  8  Period  Weeks    Status  New     Target Date  01/01/20            Plan - 12/25/19 1256    Clinical Impression Statement  Pt. completed 9 minutes of continuous walking with stepping over hurdles in //-bars before 1st rest break.  Increase low back/ R hip pain during prolonged standing therex./ walking tasks.  No LOB with good stability as long as pt. has UE assist in //-bars or rollator.  Pt. unable to ambulate without use of UE due to increaes R LE pain/ fear of falling.  Several short seated rest breaks but good overall tx. tolerance with LE resisted and UE ex. program.    Examination-Activity Limitations  Toileting;Stand;Squat;Lift;Stairs    Stability/Clinical Decision Making  Evolving/Moderate complexity    Clinical Decision Making  Moderate    Rehab Potential  Fair    PT Frequency  1x / week    PT Duration  8 weeks    PT Treatment/Interventions  Moist Heat;Functional mobility training;Therapeutic activities;Therapeutic exercise;Balance training;Patient/family education;Manual techniques;Passive range of motion;Neuromuscular re-education;Stair training;Gait training;ADLs/Self Care Home Management    PT Next Visit Plan  Decrease sitting time/ increase standing and walking tolerance.   Dynamic balance tasks.    PT Home Exercise Plan  [email or web address]    Consulted and Agree with Plan of Care  Patient;Family member/caregiver    Family Member Consulted  spouse: [name]       Patient will benefit from skilled therapeutic intervention in order to improve the following deficits and impairments:  Abnormal gait, Decreased endurance, Decreased activity tolerance, Pain, Decreased balance, Impaired flexibility, Decreased strength, Postural dysfunction  Visit Diagnosis: Muscle weakness (generalized)  Gait difficulty  Balance problem  Abnormal posture     Problem List Patient Active Problem List   Diagnosis Date Noted  . Lymphedema 04/15/2019  . Bilateral leg edema 01/25/2019  . Hyponatremia 12/24/2018  . SIADH (syndrome of  inappropriate ADH production) ([place]) 12/24/2018  . Erosion of urethra due to catheterization of urinary tract ([place]) 10/27/2018  . Generalized weakness 10/14/2018  . Hip fracture ([place]) 09/15/2018  . 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 HLD (hyperlipidemia) 03/24/2018  . Contusion of right knee 02/14/2018  . Bradycardia 12/28/2017  . 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 Chronic pain of right knee 08/10/2016  . Right ankle pain 08/10/2016  . 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  . 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[place]) 12/11/2015  . Urinary retention 09/08/2015  . Lymphoma, 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[place]) 04/03/2015  . Breathlessness on exertion 11/21/2014  . Breath shortness 11/21/2014  . Arthropathy 11/07/2014  . Atrial flutter, paroxysmal ([place]) 11/07/2014  . Type 2 diabetes mellitus ([place]) 11/07/2014  . Benign essential tremor 11/07/2014  . Benign essential HTN 11/07/2014  . Mixed incontinence 11/07/2014  . Hypercholesterolemia without hypertriglyceridemia 11/07/2014  . Apnea, sleep 11/07/2014  . Controlled type 2 diabetes mellitus without complication ([place]) 21/22/4825  . Pure hypercholesterolemia 11/07/2014  . Other abnormalities of gait and mobility 11/02/2011  . Decreased mobility 11/02/2011  . Abnormal gait 06/29/2011  . Discoordination 06/29/2011   Pura Spice, PT, DPT # [id number] 12/26/2019, 6:48 PM  [place] [facility] [address] [place], [place], [address] Phone: [phone]   Fax:  [phone]  Name: Amandeep Nesmith MRN: [id number] Date of Birth: [birth year]

## 2020-01-01 ENCOUNTER — Ambulatory Visit: Payer: Medicare Other | Attending: Orthopedic Surgery | Admitting: Physical Therapy

## 2020-01-01 ENCOUNTER — Other Ambulatory Visit: Payer: Self-pay

## 2020-01-01 ENCOUNTER — Encounter: Payer: Self-pay | Admitting: Physical Therapy

## 2020-01-01 DIAGNOSIS — M6281 Muscle weakness (generalized): Secondary | ICD-10-CM

## 2020-01-01 DIAGNOSIS — R269 Unspecified abnormalities of gait and mobility: Secondary | ICD-10-CM | POA: Diagnosis present

## 2020-01-01 DIAGNOSIS — R2689 Other abnormalities of gait and mobility: Secondary | ICD-10-CM

## 2020-01-01 DIAGNOSIS — R293 Abnormal posture: Secondary | ICD-10-CM | POA: Diagnosis present

## 2020-01-01 NOTE — Therapy (Signed)
[facility] [address] [place], [place], [address] Phone: [phone]   Fax:  [phone]  Physical Therapy Treatment  Patient Details  Name: Mike Holloway MRN: [id number] Date of Birth: 1940-12-28 Referring Provider (PT): Dr. Rudene Christians   Encounter Date: 01/01/2020  PT End of Session - 01/01/20 1308    Visit Number  55    Number of Visits  55    Date for PT Re-Evaluation  01/01/20    Authorization - Visit Number  9    Authorization - Number of Visits  10    PT Start Time  6761    PT Stop Time  9509    PT Time Calculation (min)  56 min    Equipment Utilized During Treatment  Gait belt    Activity Tolerance  Patient tolerated treatment well;Patient limited by pain;Patient limited by fatigue    Behavior During Therapy  [facility] for tasks assessed/performed       Past Medical History:  Diagnosis Date  . Arthritis   . Atrial flutter ([place])   . Diabetes mellitus ([place])   . Essential tremor   . Essential tremor    deep brain stimulator   . Hypercholesteremia   . Hypertension   . Incontinence   . 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 Sleep apnea   . Stroke [facility])     Past Surgical History:  Procedure Laterality Date  . ABLATION    . APPENDECTOMY    . CATARACT EXTRACTION, BILATERAL    . COLONOSCOPY WITH PROPOFOL N/A 03/17/2018   Procedure: COLONOSCOPY WITH PROPOFOL;  Surgeon: Jonathon Bellows, MD;  Location: [facility] ENDOSCOPY;  Service: Gastroenterology;  Laterality: N/A;  . DEEP BRAIN STIMULATOR PLACEMENT    . 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 HEMORRHOID SURGERY    . HERNIA REPAIR    . HIP FRACTURE SURGERY    . INTRAMEDULLARY (IM) NAIL INTERTROCHANTERIC Right 09/16/2018   Procedure: INTRAMEDULLARY (IM) NAIL INTERTROCHANTRIC;  Surgeon: Hessie Knows, MD;  Location: [facility] ORS;  Service:  Orthopedics;  Laterality: Right;  . IR CATHETER TUBE CHANGE  11/22/2018  . NASAL SINUS SURGERY    . ORCHIECTOMY    . TONSILLECTOMY      There were no vitals filed for this visit.  Subjective Assessment - 01/01/20 1307    Subjective  Pts. MD adjusted deep brain stimulator last week and changed meds.  Pts. wife reports pt. has been more tired and having an increase in tremors.  Pt. also received the Covid vaccine last week [employer] and [employer]).  Pt. states he is doing okay today but feels tired.    Patient is accompained by:  Family member    Pertinent History  Pt. states R knee is bothering him but it is the typical discomfort he experiences    Limitations  House hold activities;Walking;Standing;Lifting    Patient Stated Goals  Improve LE strength/ gait and balance with daily tasks.  Improve gait/ safety/ progression to least assistive device.    Currently in Pain?  Yes    Pain Score  2     Pain Location  Knee    Pain Orientation  Right    Pain Descriptors / Indicators  Aching    Pain Type  Chronic pain    Pain Onset  More than a month ago  Pain Onset  More than a month ago         TheraEx.:  Seated/standing 4# ex.: marching/ LAQ/ 3" plinth touches 10x2 each.  Walking with 4# ankle wt. Over green hurdles with UE assist 2x.  Lateral walking with 4# ankle wt. 2x in //-bars. Seated UE GTB scap. Retractoin/ horizontal abduction/ diagonals 20x each (upright posture/ MH to low back in chair).   NuStep 12 minutesL6B LEonly(warm-up)- consistent cadence/ no rest breaks.  Neuromuscular Re-Education:  Walking in //-bars 4x with UE assist and green hurdle step overs. 12" hurdle step overs in //-bars 2x Stair climbing 4 steps with step to pattern/ B handrails.    Walking at agility ladder working on 16" stride length (recip. Pattern/ consistent) with RW. Walking out to car with use of rollator/ cuing to increase hip flexion and step pattern over thresholds.       PT  Long Term Goals - 11/06/19 1434      PT LONG TERM GOAL #1   Title  Pt. independent with HEP to increase B hip flexion/ R quad muscle strength 1/2 muscle grade to improve pain-free mobility.    Baseline  R knee extension limited to 4/5 MMT secondary to pain/ fear of pain. 2/9: Deferred    Time  4    Period  Weeks    Status  Partially Met    Target Date  01/01/20      PT LONG TERM GOAL #2   Title  Pt. will increase Berg balance test to >40 out of 56 to improve independence with gait/ decrease fall risk.    Baseline  Berg: 22/56 (significant fall risk); 9/30 32/56.  11/10: 32 (limited by R knee pain)  2/9: 33/56 limited due to R knee and Low Back pain    Time  4    Period  Weeks    Status  Not Met    Target Date  01/01/20      PT LONG TERM GOAL #3   Title  Pt. able to ambulate 100 feet with consistent 2 point gait pattern and use of least restrictive assistive devce to improve household mobility.    Baseline  Pt. able to ambulate with use of RW/ CGA to min. A for safety.   Heavy use of B UE.; 9/30 pt able to ambulate with rollator with inconsistent 2 point gait pattern, heavy reliance on UE CGA/supervision  2/9: Pt. uses 2 point step pattern with walking with rollader with decreased stride length. Pt. would be limited due to pain in the knee walking for >100 ft.    Time  4    Period  Weeks    Status  Partially Met      PT LONG TERM GOAL #4   Title  Pt. able to stand from normal chair with no UE assist to improve safety/independence with transfers.     Baseline  Unable to stand from standard chair without heavy UE assist. 10/6, pt unable to stand from standard chair without UE support.  11/10: benefits from 1 UE assist    2/9: Pt. can rise from normal chair with 1 UE assist and must control descent with UE assist.    Time  4    Period  Weeks    Status  Partially Met    Target Date  01/01/20      PT LONG TERM GOAL #5   Title  Pt. will perform a sit to stand with 1 UE support to  be able  to perform toileting activities with more independance.    Baseline  Pt. requires 2 UE to push from to sit to stand from standard chair height. Pt. has grab rails available but can not get up by pulling.    Time  8    Period  Weeks    Status  New    Target Date  01/01/20            Plan - 01/01/20 1311    Clinical Impression Statement  Pt. able to ascend/ descend 4 stairs with B UE assist on handrails and SBA for safety.  Pt. will need to climb 6 stairs (steep) to enter son's house.  Pt. unable to complete stairs with recip. pattern due to R knee pain/limitations.  Pt. also able to complete 12" step overs in //-bars with upright posture and consistent hip/knee flexion for clearance.  Moderate LE fatigue after tx. session today.    Examination-Activity Limitations  Toileting;Stand;Squat;Lift;Stairs    Stability/Clinical Decision Making  Evolving/Moderate complexity    Clinical Decision Making  Moderate    Rehab Potential  Fair    PT Frequency  1x / week    PT Duration  8 weeks    PT Treatment/Interventions  Moist Heat;Functional mobility training;Therapeutic activities;Therapeutic exercise;Balance training;Patient/family education;Manual techniques;Passive range of motion;Neuromuscular re-education;Stair training;Gait training;ADLs/Self Care Home Management    PT Next Visit Plan  Decrease sitting time/ increase standing and walking tolerance.   Dynamic balance tasks.  RECERT next tx session.    PT Home Exercise Plan  [email or web address]    Consulted and Agree with Plan of Care  Patient;Family member/caregiver    Family Member Consulted  spouse: [name]       Patient will benefit from skilled therapeutic intervention in order to improve the following deficits and impairments:  Abnormal gait, Decreased endurance, Decreased activity tolerance, Pain, Decreased balance, Impaired flexibility, Decreased strength, Postural dysfunction  Visit Diagnosis: Muscle weakness (generalized)  Gait  difficulty  Balance problem  Abnormal posture     Problem List Patient Active Problem List   Diagnosis Date Noted  . Lymphedema 04/15/2019  . Bilateral leg edema 01/25/2019  . Hyponatremia 12/24/2018  . SIADH (syndrome of inappropriate ADH production) ([place]) 12/24/2018  . Erosion of urethra due to catheterization of urinary tract ([place]) 10/27/2018  . Generalized weakness 10/14/2018  . Hip fracture ([place]) 09/15/2018  . 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 HLD (hyperlipidemia) 03/24/2018  . Contusion of right knee 02/14/2018  . Bradycardia 12/28/2017  . 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 Chronic pain of right knee 08/10/2016  . Right ankle pain 08/10/2016  . 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  . 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[place]) 12/11/2015  . Urinary retention 09/08/2015  . Lymphoma, 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[place]) 04/03/2015  . Breathlessness on exertion 11/21/2014  . Breath shortness 11/21/2014  . Arthropathy 11/07/2014  . Atrial flutter, paroxysmal ([place]) 11/07/2014  . Type 2 diabetes mellitus ([place]) 11/07/2014  . Benign essential tremor 11/07/2014  . Benign essential HTN 11/07/2014  . Mixed incontinence 11/07/2014  . Hypercholesterolemia without hypertriglyceridemia 11/07/2014  . Apnea, sleep 11/07/2014  . Controlled type 2 diabetes mellitus without complication ([place]) 93/81/0175  . Pure hypercholesterolemia 11/07/2014  . Other abnormalities of gait and mobility 11/02/2011  . Decreased mobility 11/02/2011  . Abnormal gait 06/29/2011  . Discoordination 06/29/2011   Pura Spice, PT, DPT # [id number] 01/01/2020, 4:54 PM  [place]  [facility] [address] [place], [place], [address] Phone: [phone]   Fax:  [phone]  Name: Mike Holloway MRN: [id number] Date of Birth: 15-Nov-1940

## 2020-01-03 ENCOUNTER — Encounter: Payer: Medicare Other | Admitting: Physical Therapy

## 2020-01-08 ENCOUNTER — Ambulatory Visit: Payer: Medicare Other | Admitting: Physical Therapy

## 2020-01-08 ENCOUNTER — Other Ambulatory Visit: Payer: Self-pay

## 2020-01-08 ENCOUNTER — Encounter: Payer: Self-pay | Admitting: Physical Therapy

## 2020-01-08 DIAGNOSIS — M6281 Muscle weakness (generalized): Secondary | ICD-10-CM | POA: Diagnosis not present

## 2020-01-08 DIAGNOSIS — R293 Abnormal posture: Secondary | ICD-10-CM

## 2020-01-08 DIAGNOSIS — R269 Unspecified abnormalities of gait and mobility: Secondary | ICD-10-CM

## 2020-01-08 DIAGNOSIS — R2689 Other abnormalities of gait and mobility: Secondary | ICD-10-CM

## 2020-01-08 NOTE — Therapy (Signed)
[facility] [address] [place], [place], [address] Phone: [phone]   Fax:  [phone]  Physical Therapy Treatment  Patient Details  Name: Mike Holloway MRN: [id number] Date of Birth: Nov 25, 1940 Referring Provider (PT): Dr. Rudene Christians   Encounter Date: 01/08/2020  PT End of Session - 01/12/20 1656    Visit Number  56    Number of Visits  63    Date for PT Re-Evaluation  03/04/20    Authorization - Visit Number  1    Authorization - Number of Visits  10    PT Start Time  3354    PT Stop Time  5625    PT Time Calculation (min)  56 min    Equipment Utilized During Treatment  Gait belt    Activity Tolerance  Patient tolerated treatment well;Patient limited by pain;Patient limited by fatigue    Behavior During Therapy  [facility] for tasks assessed/performed       Past Medical History:  Diagnosis Date  . Arthritis   . Atrial flutter ([place])   . Diabetes mellitus ([place])   . Essential tremor   . Essential tremor    deep brain stimulator   . Hypercholesteremia   . Hypertension   . Incontinence   . 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 Sleep apnea   . Stroke [facility])     Past Surgical History:  Procedure Laterality Date  . ABLATION    . APPENDECTOMY    . CATARACT EXTRACTION, BILATERAL    . COLONOSCOPY WITH PROPOFOL N/A 03/17/2018   Procedure: COLONOSCOPY WITH PROPOFOL;  Surgeon: Jonathon Bellows, MD;  Location[facility] ENDOSCOPY;  Service: Gastroenterology;  Laterality: N/A;  . DEEP BRAIN STIMULATOR PLACEMENT    . 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 HEMORRHOID SURGERY    . HERNIA REPAIR    . HIP FRACTURE SURGERY    . INTRAMEDULLARY (IM) NAIL INTERTROCHANTERIC Right 09/16/2018   Procedure: INTRAMEDULLARY (IM) NAIL INTERTROCHANTRIC;  Surgeon: Hessie Knows, MD;  Location: [facility] ORS;  Service:  Orthopedics;  Laterality: Right;  . IR CATHETER TUBE CHANGE  11/22/2018  . NASAL SINUS SURGERY    . ORCHIECTOMY    . TONSILLECTOMY      There were no vitals filed for this visit.      [facility] PT Assessment - 01/12/20 0001      Assessment   Medical Diagnosis  S/p R subtrochanteric hip fracture, S/p R ORIF fracture of hip.      Referring Provider (PT)  Dr. Rudene Christians    Onset Date/Surgical Date  09/15/18    Prior Therapy  Yes, known well to PT      Cognition   Overall Cognitive Status  Within Functional Limits for tasks assessed      Pt. states he walked into MD appt. with rollator and did not use w/c. R knee pain (1.5/10)       TheraEx.:  Seated/standing 4# ex.: marching/ LAQ/ 3" plinth touches 10x2 each.Walking with 4# ankle wt. Forward/Lateral  2x in //-bars. Standing toe/ heel raises with B UE assist.  Seated B shoulder AROM: flexion/ abduction 10x each (mirror feedback) NuStep 10 minutesL6B LEonly(warm-up)- consistent cadence/ no rest breaks.  Neuromuscular Re-Education:  Fishing (prolonged standing)- 2x outside of //-bars.   Walking in //-bars 4x with UE assist and green hurdle step  overs. Stair climbing 4 steps x 2 with step to pattern/ B handrails.    Walking at agility ladder working on 16" stride length (recip. Pattern/ consistent) with RW. Walking out to car with use of rollator/ cuing to increase hip flexion and step pattern over thresholds.     PT Long Term Goals - 01/12/20 1703      PT LONG TERM GOAL #1   Title  Pt. independent with HEP to increase B hip flexion/ R quad muscle strength 1/2 muscle grade to improve pain-free mobility.    Baseline  R knee extension limited to 4/5 MMT secondary to pain/ fear of pain. 2/9: Deferred.  4/13: 4+/5 MMT (max potential).    Time  4    Period  Weeks    Status  Achieved    Target Date  01/08/20      PT LONG TERM GOAL #2   Title  Pt. will increase Berg balance test to >40 out of 56 to improve  independence with gait/ decrease fall risk.    Baseline  Berg: 22/56 (significant fall risk); 9/30 32/56.  11/10: 32 (limited by R knee pain)  2/9: 33/56 limited due to R knee and Low Back pain    Time  8    Period  Weeks    Status  Not Met    Target Date  03/04/20      PT LONG TERM GOAL #3   Title  Pt. able to ambulate 100 feet with consistent 2 point gait pattern and use of least restrictive assistive devce to improve household mobility.    Baseline  Pt. able to ambulate with use of RW/ CGA to min. A for safety.   Heavy use of B UE.; 9/30 pt able to ambulate with rollator with inconsistent 2 point gait pattern, heavy reliance on UE CGA/supervision  2/9: Pt. uses 2 point step pattern with walking with rollader with decreased stride length. Pt. would be limited due to pain in the knee walking for >100 ft.    Time  4    Period  Weeks    Status  Partially Met    Target Date  03/04/20      PT LONG TERM GOAL #4   Title  Pt. able to stand from normal chair with no UE assist to improve safety/independence with transfers.     Baseline  Unable to stand from standard chair without heavy UE assist. 10/6, pt unable to stand from standard chair without UE support.  11/10: benefits from 1 UE assist    2/9: Pt. can rise from normal chair with 1 UE assist and must control descent with UE assist.    Time  8    Period  Weeks    Status  Partially Met    Target Date  03/04/20      PT LONG TERM GOAL #5   Title  Pt. will perform a sit to stand with 1 UE support to be able to perform toileting activities with more independance.    Baseline  Pt. requires 2 UE to push from to sit to stand from standard chair height. Pt. has grab rails available but can not get up by pulling.    Time  8    Period  Weeks    Status  Partially Met    Target Date  03/04/20      Additional Long Term Goals   Additional Long Term Goals  Yes      PT  LONG TERM GOAL #6   Title  Pt. will ascend/ descend 4 steps with use of B  handrails and step pattern with mod. independence safely to be able to enter son's house.    Baseline  Step pattern with heavy UE assist and CGA/min. A for safety.    Time  8    Period  Weeks    Status  New    Target Date  03/04/20            Plan - 01/12/20 1659    Clinical Impression Statement  Pt. has shown a marked increase in standing tolerance over past 3 weeks.  Pt. completed 8 min. of prolonged standing without rest break during fishing activity outside of //-bars. Pt. remains limited by R knee/back pain and generalized muscle fatigue. PT instructed pt. in posture/ wt. shifting to L to decrease pain/ increase standing endurance.  Pt. walking with use of rollator and increase cadence/ no LOB. Good functional B shoulder AROM during wand ex./ overhead head reaching.  Pt. has been able to ascend/ desend 4 stairs x 2 with heavy UE assist.  Pt. will continue to benefit from skilled PT services at least 1x/week to improve LE strength/ gait/ functional mobility.    Examination-Activity Limitations  Toileting;Stand;Squat;Lift;Stairs    Stability/Clinical Decision Making  Evolving/Moderate complexity    Clinical Decision Making  Moderate    Rehab Potential  Fair    PT Frequency  1x / week    PT Duration  8 weeks    PT Treatment/Interventions  Moist Heat;Functional mobility training;Therapeutic activities;Therapeutic exercise;Balance training;Patient/family education;Manual techniques;Passive range of motion;Neuromuscular re-education;Stair training;Gait training;ADLs/Self Care Home Management    PT Next Visit Plan  Decrease sitting time/ increase standing and walking tolerance.   Dynamic balance tasks.    PT Home Exercise Plan  [email or web address]    Consulted and Agree with Plan of Care  Patient;Family member/caregiver    Family Member Consulted  spouse: [name]       Patient will benefit from skilled therapeutic intervention in order to improve the following deficits and impairments:  Abnormal gait,  Decreased endurance, Decreased activity tolerance, Pain, Decreased balance, Impaired flexibility, Decreased strength, Postural dysfunction  Visit Diagnosis: Muscle weakness (generalized)  Gait difficulty  Balance problem  Abnormal posture     Problem List Patient Active Problem List   Diagnosis Date Noted  . Lymphedema 04/15/2019  . Bilateral leg edema 01/25/2019  . Hyponatremia 12/24/2018  . SIADH (syndrome of inappropriate ADH production) ([place]) 12/24/2018  . Erosion of urethra due to catheterization of urinary tract ([place]) 10/27/2018  . Generalized weakness 10/14/2018  . Hip fracture ([place]) 09/15/2018  . 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 HLD (hyperlipidemia) 03/24/2018  . Contusion of right knee 02/14/2018  . Bradycardia 12/28/2017  . 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 Chronic pain of right knee 08/10/2016  . Right ankle pain 08/10/2016  . 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  . 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[place]) 12/11/2015  . Urinary retention 09/08/2015  . Lymphoma, 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[place]) 04/03/2015  . Breathlessness on exertion 11/21/2014  . Breath shortness 11/21/2014  . Arthropathy 11/07/2014  . Atrial flutter, paroxysmal ([place]) 11/07/2014  . Type 2 diabetes mellitus ([place]) 11/07/2014  . Benign essential tremor 11/07/2014  . Benign essential HTN 11/07/2014  . Mixed incontinence 11/07/2014  . Hypercholesterolemia without hypertriglyceridemia 11/07/2014  . Apnea, sleep 11/07/2014  . Controlled type 2 diabetes mellitus without  complication ([place]) 06/34/9494  . Pure hypercholesterolemia 11/07/2014  . Other abnormalities of gait and mobility 11/02/2011  . Decreased mobility 11/02/2011  . Abnormal gait 06/29/2011  . Discoordination 06/29/2011   Pura Spice, PT, DPT # [id number] 01/12/2020, 5:12 PM  [place] [facility] [address] [place], [place], [address] Phone: [phone]   Fax:  [phone]  Name: Frazier Balfour MRN: [id number] Date of Birth: 1941-07-08

## 2020-01-10 ENCOUNTER — Encounter: Payer: Medicare Other | Admitting: Physical Therapy

## 2020-01-15 ENCOUNTER — Ambulatory Visit: Payer: Medicare Other | Admitting: Physical Therapy

## 2020-01-15 ENCOUNTER — Other Ambulatory Visit: Payer: Self-pay

## 2020-01-15 DIAGNOSIS — M6281 Muscle weakness (generalized): Secondary | ICD-10-CM | POA: Diagnosis not present

## 2020-01-15 DIAGNOSIS — R2689 Other abnormalities of gait and mobility: Secondary | ICD-10-CM

## 2020-01-15 DIAGNOSIS — R269 Unspecified abnormalities of gait and mobility: Secondary | ICD-10-CM

## 2020-01-15 DIAGNOSIS — R293 Abnormal posture: Secondary | ICD-10-CM

## 2020-01-17 ENCOUNTER — Encounter: Payer: Medicare Other | Admitting: Physical Therapy

## 2020-01-19 NOTE — Therapy (Addendum)
[place] [address] [place], [place], [address] Phone: [phone]   Fax:  [phone]  Physical Therapy Treatment  Patient Details  Name: Mike Holloway MRN: [id number] Date of Birth: [birth year] Referring Provider (PT): Dr. Rudene Christians   Encounter Date: 01/15/2020    PT End of Session - 01/19/20 1008    Visit Number  42    Number of Visits  63    Date for PT Re-Evaluation  03/04/20    Authorization - Visit Number  2    Authorization - Number of Visits  10    PT Start Time  4166    PT Stop Time  0630    PT Time Calculation (min)  59 min    Equipment Utilized During Treatment  Gait belt    Activity Tolerance  Patient tolerated treatment well;Patient limited by pain;Patient limited by fatigue    Behavior During Therapy  [facility] for tasks assessed/performed         Past Medical History:  Diagnosis Date  . Arthritis   . Atrial flutter ([place])   . Diabetes mellitus ([place])   . Essential tremor   . Essential tremor    deep brain stimulator   . Hypercholesteremia   . Hypertension   . Incontinence   . 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 Sleep apnea   . Stroke [facility])     Past Surgical History:  Procedure Laterality Date  . ABLATION    . APPENDECTOMY    . CATARACT EXTRACTION, BILATERAL    . COLONOSCOPY WITH PROPOFOL N/A 03/17/2018   Procedure: COLONOSCOPY WITH PROPOFOL;  Surgeon: Jonathon Bellows, MD;  Location: [facility] ENDOSCOPY;  Service: Gastroenterology;  Laterality: N/A;  . DEEP BRAIN STIMULATOR PLACEMENT    . 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 HEMORRHOID SURGERY    . HERNIA REPAIR    . HIP FRACTURE SURGERY    . INTRAMEDULLARY (IM) NAIL INTERTROCHANTERIC Right 09/16/2018   Procedure: INTRAMEDULLARY (IM) NAIL INTERTROCHANTRIC;  Surgeon: Hessie Knows, MD;  Location: [facility] ORS;   Service: Orthopedics;  Laterality: Right;  . IR CATHETER TUBE CHANGE  11/22/2018  . NASAL SINUS SURGERY    . ORCHIECTOMY    . TONSILLECTOMY      There were no vitals filed for this visit.  Subjective Assessment - 01/22/20 1123    Subjective  Pts. wife reports that pt. sat on the commode 4x over the weekend and did not rely on bedside commode.    Patient is accompained by:  Family member    Pertinent History  Pt. states R knee is bothering him but it is the typical discomfort he experiences    Limitations  House hold activities;Walking;Standing;Lifting    Patient Stated Goals  Improve LE strength/ gait and balance with daily tasks.  Improve gait/ safety/ progression to least assistive device.    Currently in Pain?  Yes    Pain Score  1     Pain Location  Knee    Pain Orientation  Right    Pain Onset  More than a month ago    Pain Onset  More than a month ago      There.ex.:  Seated/ standing hip ex.: LAQ/ marching/ heel raises 20x. Standing lateral walking 3x in //-bars (mirror feedback for posture correction).   Reassess hip adduction/  abduction MMT (4+/5 MMT).   Neuro.mm.:  Step over (blue tandem Airex) forward/ lateral with increase step pattern/ clearance 10x each.  (//-bars for UE) Standing dynamic balance: fishing 2x prior to seated rest break. Ambulate in hallway 150 feet x 2 with rollator and SBA for verbal cuing (posture/ BOS).  Working on stepping over outside door threshold.    Discussed sit to stands at home commode    PT Long Term Goals - 01/12/20 1703      PT LONG TERM GOAL #1   Title  Pt. independent with HEP to increase B hip flexion/ R quad muscle strength 1/2 muscle grade to improve pain-free mobility.    Baseline  R knee extension limited to 4/5 MMT secondary to pain/ fear of pain. 2/9: Deferred.  4/13: 4+/5 MMT (max potential).    Time  4    Period  Weeks    Status  Achieved    Target Date  01/08/20      PT LONG TERM GOAL #2   Title  Pt. will  increase Berg balance test to >40 out of 56 to improve independence with gait/ decrease fall risk.    Baseline  Berg: 22/56 (significant fall risk); 9/30 32/56.  11/10: 32 (limited by R knee pain)  2/9: 33/56 limited due to R knee and Low Back pain    Time  8    Period  Weeks    Status  Not Met    Target Date  03/04/20      PT LONG TERM GOAL #3   Title  Pt. able to ambulate 100 feet with consistent 2 point gait pattern and use of least restrictive assistive devce to improve household mobility.    Baseline  Pt. able to ambulate with use of RW/ CGA to min. A for safety.   Heavy use of B UE.; 9/30 pt able to ambulate with rollator with inconsistent 2 point gait pattern, heavy reliance on UE CGA/supervision  2/9: Pt. uses 2 point step pattern with walking with rollader with decreased stride length. Pt. would be limited due to pain in the knee walking for >100 ft.    Time  4    Period  Weeks    Status  Partially Met    Target Date  03/04/20      PT LONG TERM GOAL #4   Title  Pt. able to stand from normal chair with no UE assist to improve safety/independence with transfers.     Baseline  Unable to stand from standard chair without heavy UE assist. 10/6, pt unable to stand from standard chair without UE support.  11/10: benefits from 1 UE assist    2/9: Pt. can rise from normal chair with 1 UE assist and must control descent with UE assist.    Time  8    Period  Weeks    Status  Partially Met    Target Date  03/04/20      PT LONG TERM GOAL #5   Title  Pt. will perform a sit to stand with 1 UE support to be able to perform toileting activities with more independance.    Baseline  Pt. requires 2 UE to push from to sit to stand from standard chair height. Pt. has grab rails available but can not get up by pulling.    Time  8    Period  Weeks    Status  Partially Met    Target Date  03/04/20  Additional Long Term Goals   Additional Long Term Goals  Yes      PT LONG TERM GOAL #6   Title   Pt. will ascend/ descend 4 steps with use of B handrails and step pattern with mod. independence safely to be able to enter son's house.    Baseline  Step pattern with heavy UE assist and CGA/min. A for safety.    Time  8    Period  Weeks    Status  New    Target Date  03/04/20         Plan - 01/22/20 1126    Clinical Impression Statement  Pt. requires several short seated rest breaks after prolonged standing due to knee pain/ limitations.  No LOB during tx. session and pt. able to ambuluate 150 feet x 2 with rollator (SBA for verbal cuing).  Increase hip flexion over outside door threshold.  Increase step length forward/ lateral over blue tandem Airex.    Examination-Activity Limitations  Toileting;Stand;Squat;Lift;Stairs    Stability/Clinical Decision Making  Evolving/Moderate complexity    Clinical Decision Making  Moderate    Rehab Potential  Fair    PT Frequency  1x / week    PT Duration  8 weeks    PT Treatment/Interventions  Moist Heat;Functional mobility training;Therapeutic activities;Therapeutic exercise;Balance training;Patient/family education;Manual techniques;Passive range of motion;Neuromuscular re-education;Stair training;Gait training;ADLs/Self Care Home Management    PT Next Visit Plan  Decrease sitting time/ increase standing and walking tolerance.   Dynamic balance tasks.    PT Home Exercise Plan  [email or web address]    Consulted and Agree with Plan of Care  Patient;Family member/caregiver    Family Member Consulted  spouse: Mike Holloway       Patient will benefit from skilled therapeutic intervention in order to improve the following deficits and impairments:  Abnormal gait, Decreased endurance, Decreased activity tolerance, Pain, Decreased balance, Impaired flexibility, Decreased strength, Postural dysfunction  Visit Diagnosis: Muscle weakness (generalized)  Gait difficulty  Balance problem  Abnormal posture     Problem List Patient Active Problem List   Diagnosis Date  Noted  . Lymphedema 04/15/2019  . Bilateral leg edema 01/25/2019  . Hyponatremia 12/24/2018  . SIADH (syndrome of inappropriate ADH production) ([place]) 12/24/2018  . Erosion of urethra due to catheterization of urinary tract ([place]) 10/27/2018  . Generalized weakness 10/14/2018  . Hip fracture ([place]) 09/15/2018  . 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 HLD (hyperlipidemia) 03/24/2018  . Contusion of right knee 02/14/2018  . Bradycardia 12/28/2017  . 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 Chronic pain of right knee 08/10/2016  . Right ankle pain 08/10/2016  . 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  . 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[place]) 12/11/2015  . Urinary retention 09/08/2015  . Lymphoma, 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[place]) 04/03/2015  . Breathlessness on exertion 11/21/2014  . Breath shortness 11/21/2014  . Arthropathy 11/07/2014  . Atrial flutter, paroxysmal ([place]) 11/07/2014  . Type 2 diabetes mellitus ([place]) 11/07/2014  . Benign essential tremor 11/07/2014  . Benign essential HTN 11/07/2014  . Mixed incontinence 11/07/2014  . Hypercholesterolemia without hypertriglyceridemia 11/07/2014  . Apnea, sleep 11/07/2014  . Controlled type 2 diabetes mellitus without complication ([place]) 37/16/9678  . Pure hypercholesterolemia 11/07/2014  . Other abnormalities of gait and mobility 11/02/2011  . Decreased mobility 11/02/2011  . Abnormal gait 06/29/2011  . Discoordination 06/29/2011   Pura Spice, PT, DPT # [phone] 01/22/2020, 11:33 AM  [place]  [facility] [address] [place], [place], [address] Phone: [phone]   Fax:  [phone]  Name: Filimon Miranda MRN: [id number] Date of Birth: 15-Nov-1940

## 2020-01-22 ENCOUNTER — Encounter: Payer: Self-pay | Admitting: Physical Therapy

## 2020-01-22 ENCOUNTER — Ambulatory Visit: Payer: Medicare Other | Admitting: Physical Therapy

## 2020-01-22 ENCOUNTER — Other Ambulatory Visit: Payer: Self-pay

## 2020-01-22 DIAGNOSIS — M6281 Muscle weakness (generalized): Secondary | ICD-10-CM

## 2020-01-22 DIAGNOSIS — R269 Unspecified abnormalities of gait and mobility: Secondary | ICD-10-CM

## 2020-01-22 DIAGNOSIS — R2689 Other abnormalities of gait and mobility: Secondary | ICD-10-CM

## 2020-01-22 DIAGNOSIS — R293 Abnormal posture: Secondary | ICD-10-CM

## 2020-01-22 NOTE — Therapy (Signed)
[facility] [address] [place], [place], [address] Phone: [phone]   Fax:  [phone]  Physical Therapy Treatment  Patient Details  Name: Mike Holloway MRN: [id number] Date of Birth: 20-Feb-1941 Referring Provider (PT): Dr. Rudene Christians   Encounter Date: 01/22/2020  PT End of Session - 01/22/20 1254    Visit Number  63    Number of Visits  63    Date for PT Re-Evaluation  03/04/20    Authorization - Visit Number  3    Authorization - Number of Visits  10    PT Start Time  1740    PT Stop Time  8144    PT Time Calculation (min)  61 min    Equipment Utilized During Treatment  Gait belt    Activity Tolerance  Patient tolerated treatment well;Patient limited by pain;Patient limited by fatigue    Behavior During Therapy  [facility] for tasks assessed/performed       Past Medical History:  Diagnosis Date  . Arthritis   . Atrial flutter ([place])   . Diabetes mellitus ([place])   . Essential tremor   . Essential tremor    deep brain stimulator   . Hypercholesteremia   . Hypertension   . Incontinence   . 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[place])    grew on the testical  . Sleep apnea   . Stroke [facility])     Past Surgical History:  Procedure Laterality Date  . ABLATION    . APPENDECTOMY    . CATARACT EXTRACTION, BILATERAL    . COLONOSCOPY WITH PROPOFOL N/A 03/17/2018   Procedure: COLONOSCOPY WITH PROPOFOL;  Surgeon: Jonathon Bellows, MD;  Location: [facility] ENDOSCOPY;  Service: Gastroenterology;  Laterality: N/A;  . DEEP BRAIN STIMULATOR PLACEMENT    . 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 HEMORRHOID SURGERY    . HERNIA REPAIR    . HIP FRACTURE SURGERY    . INTRAMEDULLARY (IM) NAIL INTERTROCHANTERIC Right 09/16/2018   Procedure: INTRAMEDULLARY (IM) NAIL INTERTROCHANTRIC;  Surgeon: Hessie Knows, MD;  Location: [facility] ORS;  Service:  Orthopedics;  Laterality: Right;  . IR CATHETER TUBE CHANGE  11/22/2018  . NASAL SINUS SURGERY    . ORCHIECTOMY    . TONSILLECTOMY      There were no vitals filed for this visit.  Subjective Assessment - 01/22/20 1254    Subjective  Pt. entered PT with no new issues.  Pt. continues to use commode at home and pts. wife states pt. is walking better with increase hip flexion.    Patient is accompained by:  Family member    Pertinent History  Pt. states R knee is bothering him but it is the typical discomfort he experiences    Limitations  House hold activities;Walking;Standing;Lifting    Patient Stated Goals  Improve LE strength/ gait and balance with daily tasks.  Improve gait/ safety/ progression to least assistive device.    Currently in Pain?  No/denies    Pain Onset  More than a month ago    Pain Onset  More than a month ago        Neuro.mm.:  Walking in //-bars with cone taps and 3" plinth step overs at end of //-bars 2x2 (short seated rest break).   Lateral walking in //-bars (mirror feedback) 2x before seated break.   Green hurdles in //-bars (small/large) with heavy UE  assist.  Ambulate in clinic with rollator and SBA for verbal cuing (posture/ BOS).  Working on stepping over outside door threshold.   Stepping down curb outside with rollator    There.ex.:  Seated/ standing hip ex.: LAQ/ marching/ heel and toe raises 20x. Seated UE 3# press-ups 20x. Standing UE 3# bicep curls/ punches 20x.   Discussed HEP Nustep L5 10 min. B LE only. Consistent cadence.      PT Long Term Goals - 01/12/20 1703      PT LONG TERM GOAL #1   Title  Pt. independent with HEP to increase B hip flexion/ R quad muscle strength 1/2 muscle grade to improve pain-free mobility.    Baseline  R knee extension limited to 4/5 MMT secondary to pain/ fear of pain. 2/9: Deferred.  4/13: 4+/5 MMT (max potential).    Time  4    Period  Weeks    Status  Achieved    Target Date  01/08/20      PT LONG  TERM GOAL #2   Title  Pt. will increase Berg balance test to >40 out of 56 to improve independence with gait/ decrease fall risk.    Baseline  Berg: 22/56 (significant fall risk); 9/30 32/56.  11/10: 32 (limited by R knee pain)  2/9: 33/56 limited due to R knee and Low Back pain    Time  8    Period  Weeks    Status  Not Met    Target Date  03/04/20      PT LONG TERM GOAL #3   Title  Pt. able to ambulate 100 feet with consistent 2 point gait pattern and use of least restrictive assistive devce to improve household mobility.    Baseline  Pt. able to ambulate with use of RW/ CGA to min. A for safety.   Heavy use of B UE.; 9/30 pt able to ambulate with rollator with inconsistent 2 point gait pattern, heavy reliance on UE CGA/supervision  2/9: Pt. uses 2 point step pattern with walking with rollader with decreased stride length. Pt. would be limited due to pain in the knee walking for >100 ft.    Time  4    Period  Weeks    Status  Partially Met    Target Date  03/04/20      PT LONG TERM GOAL #4   Title  Pt. able to stand from normal chair with no UE assist to improve safety/independence with transfers.     Baseline  Unable to stand from standard chair without heavy UE assist. 10/6, pt unable to stand from standard chair without UE support.  11/10: benefits from 1 UE assist    2/9: Pt. can rise from normal chair with 1 UE assist and must control descent with UE assist.    Time  8    Period  Weeks    Status  Partially Met    Target Date  03/04/20      PT LONG TERM GOAL #5   Title  Pt. will perform a sit to stand with 1 UE support to be able to perform toileting activities with more independance.    Baseline  Pt. requires 2 UE to push from to sit to stand from standard chair height. Pt. has grab rails available but can not get up by pulling.    Time  8    Period  Weeks    Status  Partially Met    Target Date  03/04/20      Additional Long Term Goals   Additional Long Term Goals  Yes       PT LONG TERM GOAL #6   Title  Pt. will ascend/ descend 4 steps with use of B handrails and step pattern with mod. independence safely to be able to enter son's house.    Baseline  Step pattern with heavy UE assist and CGA/min. A for safety.    Time  8    Period  Weeks    Status  New    Target Date  03/04/20            Plan - 01/22/20 1254    Clinical Impression Statement  Pt. reports no back pain prior to tx. but increase R knee/ low back issues after standing ther.ex./ balance tasks. Pt. able to complete 12" green hurdles hip flexion/ step overs in //-bars (no hip circumduction).  Pt. walking increase distances in clinic/ outside with rollator with no increase R knee/ low back pain. Pt. had 1 LOB during standing UE ex. but able to self-correct.    Examination-Activity Limitations  Toileting;Stand;Squat;Lift;Stairs    Stability/Clinical Decision Making  Evolving/Moderate complexity    Clinical Decision Making  Moderate    Rehab Potential  Fair    PT Frequency  1x / week    PT Duration  8 weeks    PT Treatment/Interventions  Moist Heat;Functional mobility training;Therapeutic activities;Therapeutic exercise;Balance training;Patient/family education;Manual techniques;Passive range of motion;Neuromuscular re-education;Stair training;Gait training;ADLs/Self Care Home Management    PT Next Visit Plan  Decrease sitting time/ increase standing and walking tolerance.   Dynamic balance tasks.    PT Home Exercise Plan  [id number]    Consulted and Agree with Plan of Care  Patient;Family member/caregiver    Family Member Consulted  spouse: [name]       Patient will benefit from skilled therapeutic intervention in order to improve the following deficits and impairments:  Abnormal gait, Decreased endurance, Decreased activity tolerance, Pain, Decreased balance, Impaired flexibility, Decreased strength, Postural dysfunction  Visit Diagnosis: Muscle weakness (generalized)  Gait difficulty  Balance  problem  Abnormal posture     Problem List Patient Active Problem List   Diagnosis Date Noted  . Lymphedema 04/15/2019  . Bilateral leg edema 01/25/2019  . Hyponatremia 12/24/2018  . SIADH (syndrome of inappropriate ADH production) ([place]) 12/24/2018  . 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 Generalized weakness 10/14/2018  . Hip fracture ([place]) 09/15/2018  . 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 HLD (hyperlipidemia) 03/24/2018  . Contusion of right knee 02/14/2018  . Bradycardia 12/28/2017  . 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 Chronic pain of right knee 08/10/2016  . Right ankle pain 08/10/2016  . 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  . 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[place]) 12/11/2015  . Urinary retention 09/08/2015  . Lymphoma, 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[place]) 04/03/2015  . Breathlessness on exertion 11/21/2014  . Breath shortness 11/21/2014  . Arthropathy 11/07/2014  . Atrial flutter, paroxysmal ([place]) 11/07/2014  . Type 2 diabetes mellitus ([place]) 11/07/2014  . Benign essential tremor 11/07/2014  . Benign essential HTN 11/07/2014  . Mixed incontinence 11/07/2014  . Hypercholesterolemia without hypertriglyceridemia 11/07/2014  . Apnea, sleep 11/07/2014  . Controlled type 2 diabetes mellitus without complication ([place]) 98/07/9146  . Pure hypercholesterolemia 11/07/2014  . Other abnormalities of gait and mobility 11/02/2011  . Decreased mobility 11/02/2011  . Abnormal  gait 06/29/2011  . Discoordination 06/29/2011   Pura Spice, PT, DPT # [phone] 01/22/2020, 2:22 PM  [place] [facility] [address] [place], [place], [address] Phone: [phone]   Fax:  [phone]  Name: Mike Holloway MRN: [id number] Date of Birth: 11-20-1940

## 2020-01-23 ENCOUNTER — Ambulatory Visit (INDEPENDENT_AMBULATORY_CARE_PROVIDER_SITE_OTHER): Payer: Medicare Other | Admitting: Urology

## 2020-01-23 ENCOUNTER — Encounter: Payer: Self-pay | Admitting: Urology

## 2020-01-23 VITALS — BP 139/80 | HR 60 | Ht 68.0 in | Wt 216.0 lb

## 2020-01-23 DIAGNOSIS — R339 Retention of urine, unspecified: Secondary | ICD-10-CM

## 2020-01-24 ENCOUNTER — Encounter: Payer: Medicare Other | Admitting: Physical Therapy

## 2020-01-25 ENCOUNTER — Encounter: Payer: Self-pay | Admitting: Urology

## 2020-01-25 NOTE — Progress Notes (Signed)
01/23/2020 7:19 AM   Mike Holloway 1941/06/24 [id number]  Referring provider: [name], MD [place],  [place] [address]  Chief Complaint  Patient presents with  . Follow-up    HPI: 79 y.o. male with urinary retention presents today with his wife to discuss suprapubic tube placement.  Recent urodynamic study showed a residual of 900 mL.  The bladder was hyposensitive and there was no evidence of obstruction.  Prior urethral erosion with an indwelling Foley catheter and he is unable to perform intermittent catheterization.   PMH: Past Medical History:  Diagnosis Date  . Arthritis   . Atrial flutter ([place])   . Diabetes mellitus ([place])   . Essential tremor   . Essential tremor    deep brain stimulator   . Hypercholesteremia   . Hypertension   . Incontinence   . 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[place])    grew on the testical  . Sleep apnea   . Stroke [facility])     Surgical History: Past Surgical History:  Procedure Laterality Date  . ABLATION    . APPENDECTOMY    . CATARACT EXTRACTION, BILATERAL    . COLONOSCOPY WITH PROPOFOL N/A 03/17/2018   Procedure: COLONOSCOPY WITH PROPOFOL;  Surgeon: Mike Bellows, MD;  Location: [facility] ENDOSCOPY;  Service: Gastroenterology;  Laterality: N/A;  . DEEP BRAIN STIMULATOR PLACEMENT    . 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 HEMORRHOID SURGERY    . HERNIA REPAIR    . HIP FRACTURE SURGERY    . INTRAMEDULLARY (IM) NAIL INTERTROCHANTERIC Right 09/16/2018   Procedure: INTRAMEDULLARY (IM) NAIL INTERTROCHANTRIC;  Surgeon: Mike Knows, MD;  Location: [facility] ORS;  Service: Orthopedics;  Laterality: Right;  . IR CATHETER TUBE CHANGE  11/22/2018  . NASAL SINUS SURGERY    . ORCHIECTOMY    . TONSILLECTOMY      Home Medications:  Allergies as of 01/23/2020      Reactions   Clindamycin Diarrhea   Contracted C.  Diff x 2   Flagyl [metronidazole] Swelling   Swollen tongue, excessive shaking,     Ambien  [zolpidem]    Other reaction(s): Other (See Comments) Disoriented and moody Other reaction(s): Other (See Comments) Disoriented and moody   Penicillins Rash   Has patient had a PCN reaction causing immediate rash, facial/tongue/throat swelling, SOB or lightheadedness with hypotension: Unknown Has patient had a PCN reaction causing severe rash involving mucus membranes or skin necrosis: Unknown Has patient had a PCN reaction that required hospitalization: No Has patient had a PCN reaction occurring within the last 10 years: No If all of the above answers are "NO", then may proceed with Cephalosporin use.   Sulfa Antibiotics Rash      Medication List       Accurate as of January 23, 2020 11:59 PM. If you have any questions, ask your nurse or doctor.        acetaminophen 650 MG CR tablet Commonly known as: TYLENOL Take 650 mg by mouth every 8 (eight) hours as needed for pain.   Azelastine HCl 0.15 % Soln U 1 SPRAY IEN BID   cholecalciferol 25 MCG (1000 UNIT) tablet Commonly known as: VITAMIN D Take 1,000 Units by mouth at bedtime.   famotidine 20 MG tablet Commonly known as: PEPCID Take 20 mg by mouth 2 (two) times daily.   fexofenadine 180 MG tablet Commonly known  as: ALLEGRA TK 1 T PO ONCE D   FLUoxetine 10 MG capsule Commonly known as: PROZAC Take 10 mg by mouth daily.   guaiFENesin-dextromethorphan 100-10 MG/5ML syrup Commonly known as: ROBITUSSIN DM Take 5 mLs by mouth every 4 (four) hours as needed for cough.   melatonin 5 MG Tabs Take 5 mg by mouth at bedtime. Takes 2 3 mg tablets   mirabegron ER 50 MG Tb24 tablet Commonly known as: MYRBETRIQ Take 1 tablet (50 mg total) by mouth daily.   primidone 50 MG tablet Commonly known as: MYSOLINE Take 50-100 mg by mouth 2 (two) times daily. Take 2 tablets (100mg ) by mouth every morning and 1 tablet (50mg ) by mouth every  night at bedtime   propranolol ER 160 MG SR capsule Commonly known as: INDERAL LA Take 160 mg by mouth daily.   psyllium 95 % Pack Commonly known as: HYDROCIL/METAMUCIL Take 1 packet by mouth daily.   tamsulosin 0.4 MG Caps capsule Commonly known as: FLOMAX Take by mouth.   UNABLE TO FIND APPLY FROM NECK DOWN DAILY   VSL#3 Caps Take 1 capsule by mouth daily.       Allergies:  Allergies  Allergen Reactions  . Clindamycin Diarrhea    Contracted C. Diff x 2  . Flagyl [Metronidazole] Swelling    Swollen tongue, excessive shaking,    . Ambien  [Zolpidem]     Other reaction(s): Other (See Comments) Disoriented and moody Other reaction(s): Other (See Comments) Disoriented and moody  . Penicillins Rash    Has patient had a PCN reaction causing immediate rash, facial/tongue/throat swelling, SOB or lightheadedness with hypotension: Unknown Has patient had a PCN reaction causing severe rash involving mucus membranes or skin necrosis: Unknown Has patient had a PCN reaction that required hospitalization: No Has patient had a PCN reaction occurring within the last 10 years: No If all of the above answers are "NO", then may proceed with Cephalosporin use.  . Sulfa Antibiotics Rash    Family History: Family History  Problem Relation Age of Onset  . Hematuria Father   . Prostate cancer Father   . Diabetes Father   . Heart disease Mother   . Diabetes Mother   . Kidney disease Neg Hx   . Bladder Cancer Neg Hx     Social History:  reports that he has never smoked. He has never used smokeless tobacco. He reports that he does not drink alcohol or use drugs.   Physical Exam: BP 139/80   Pulse 60   Ht 5\' 8"  (1.727 m)   Wt 216 lb (98 kg)   BMI 32.84 kg/m   Constitutional:  Alert, No acute distress. HEENT: Mike Holloway AT, moist mucus membranes.  Trachea midline, no masses. Cardiovascular: No clubbing, cyanosis, or edema. Respiratory: Normal respiratory effort, no increased work of  breathing.   Assessment & Plan:    - Chronic urinary retention Ms. Mike Holloway had several questions regarding SP tube placement all of which were answered.  They would like to schedule suprapubic tube placement and IR.  I spent 30 total minutes on the day of the encounter including pre-visit review of the medical record, face-to-face time with the patient, and post visit ordering of labs/imaging/tests.   Abbie Sons, [place] [address], [place], [place] [address] [phone]

## 2020-01-28 ENCOUNTER — Emergency Department: Payer: Medicare Other

## 2020-01-28 ENCOUNTER — Ambulatory Visit (INDEPENDENT_AMBULATORY_CARE_PROVIDER_SITE_OTHER)
Admission: EM | Admit: 2020-01-28 | Discharge: 2020-01-28 | Disposition: A | Discharge status: Home / Self Care | Payer: Medicare Other | Source: Home / Self Care | Attending: Family Medicine | Admitting: Family Medicine

## 2020-01-28 ENCOUNTER — Other Ambulatory Visit: Payer: Self-pay

## 2020-01-28 ENCOUNTER — Encounter: Payer: Self-pay | Admitting: Emergency Medicine

## 2020-01-28 DIAGNOSIS — I8002 Phlebitis and thrombophlebitis of superficial vessels of left lower extremity: Secondary | ICD-10-CM | POA: Insufficient documentation

## 2020-01-28 DIAGNOSIS — Z8572 Personal history of non-Hodgkin lymphomas: Secondary | ICD-10-CM | POA: Insufficient documentation

## 2020-01-28 DIAGNOSIS — M79662 Pain in left lower leg: Secondary | ICD-10-CM | POA: Diagnosis not present

## 2020-01-28 DIAGNOSIS — Z8673 Personal history of transient ischemic attack (TIA), and cerebral infarction without residual deficits: Secondary | ICD-10-CM | POA: Insufficient documentation

## 2020-01-28 DIAGNOSIS — I10 Essential (primary) hypertension: Secondary | ICD-10-CM | POA: Diagnosis not present

## 2020-01-28 DIAGNOSIS — E119 Type 2 diabetes mellitus without complications: Secondary | ICD-10-CM | POA: Diagnosis not present

## 2020-01-28 DIAGNOSIS — Z7982 Long term (current) use of aspirin: Secondary | ICD-10-CM | POA: Insufficient documentation

## 2020-01-28 DIAGNOSIS — M79605 Pain in left leg: Secondary | ICD-10-CM | POA: Diagnosis not present

## 2020-01-28 DIAGNOSIS — M7989 Other specified soft tissue disorders: Secondary | ICD-10-CM

## 2020-01-28 DIAGNOSIS — Z79899 Other long term (current) drug therapy: Secondary | ICD-10-CM | POA: Insufficient documentation

## 2020-01-28 NOTE — ED Triage Notes (Signed)
Pt reports linear area of redness on inner L thigh appearing this morning and worsening throughout the day.  TTP.   Swelling noted in LLE > RLE.  Wife in room with patient states he has vascular issues and was told to watch out for redness and potential clots. Pt has balance and mobility issues.  Uses wheelchair and walker.

## 2020-01-28 NOTE — Discharge Instructions (Signed)
Recommend patient go to Emergency Department for further evaluation and management °

## 2020-01-28 NOTE — ED Provider Notes (Signed)
[facility]    CSN: [id number] Arrival date & time: 01/28/20  1754      History   Chief Complaint No chief complaint on file.   HPI Mike Holloway is a 79 y.o. male.   79 yo male with a h/o chronic venous insufficiency presents with a c/o left inner thigh pain, swelling and redness since this morning. Patient denies any injuries, fevers, rash, drainage, chest pain or shortness of breath.  Patient is sedentary due to h/o CVA and other chronic medical problems.     Past Medical History:  Diagnosis Date  . Arthritis   . Atrial flutter ([place])   . Diabetes mellitus ([place])   . Essential tremor   . Essential tremor    deep brain stimulator   . Hypercholesteremia   . Hypertension   . Incontinence   . 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 Sleep apnea   . Stroke [facility])     Patient Active Problem List   Diagnosis Date Noted  . Lymphedema 04/15/2019  . Bilateral leg edema 01/25/2019  . Hyponatremia 12/24/2018  . 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 Erosion of urethra due to catheterization of urinary tract ([place]) 10/27/2018  . Generalized weakness 10/14/2018  . Hip fracture ([place]) 09/15/2018  . 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 HLD (hyperlipidemia) 03/24/2018  . Contusion of right knee 02/14/2018  . Bradycardia 12/28/2017  . 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 Chronic pain of right knee 08/10/2016  . Right ankle pain 08/10/2016  . 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  . 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[place]) 12/11/2015  . Urinary retention 09/08/2015  . Lymphoma, 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[place]) 04/03/2015  . Breathlessness on exertion 11/21/2014  . Breath shortness 11/21/2014  . Arthropathy 11/07/2014  . Atrial flutter, paroxysmal ([place]) 11/07/2014  . Type 2 diabetes mellitus ([place]) 11/07/2014  .  Benign essential tremor 11/07/2014  . Benign essential HTN 11/07/2014  . Mixed incontinence 11/07/2014  . Hypercholesterolemia without hypertriglyceridemia 11/07/2014  . Apnea, sleep 11/07/2014  . Controlled type 2 diabetes mellitus without complication ([place]) 0000000  . Pure hypercholesterolemia 11/07/2014  . Other abnormalities of gait and mobility 11/02/2011  . Decreased mobility 11/02/2011  . Abnormal gait 06/29/2011  . Discoordination 06/29/2011    Past Surgical History:  Procedure Laterality Date  . ABLATION    . APPENDECTOMY    . CATARACT EXTRACTION, BILATERAL    . COLONOSCOPY WITH PROPOFOL N/A 03/17/2018   Procedure: COLONOSCOPY WITH PROPOFOL;  Surgeon: Jonathon Bellows, MD;  Location: [facility] ENDOSCOPY;  Service: Gastroenterology;  Laterality: N/A;  . DEEP BRAIN STIMULATOR PLACEMENT    . 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 HEMORRHOID SURGERY    . HERNIA REPAIR    . HIP FRACTURE SURGERY    . INTRAMEDULLARY (IM) NAIL INTERTROCHANTERIC Right 09/16/2018   Procedure: INTRAMEDULLARY (IM) NAIL INTERTROCHANTRIC;  Surgeon: Hessie Knows, MD;  Location: [facility] ORS;  Service: Orthopedics;  Laterality: Right;  . IR CATHETER TUBE CHANGE  11/22/2018  . NASAL SINUS SURGERY    . ORCHIECTOMY    . TONSILLECTOMY         Home Medications    Prior to Admission medications   Medication Sig Start Date End Date Taking? Authorizing Provider  acetaminophen (TYLENOL) 650 MG  CR tablet Take 650 mg by mouth every 8 (eight) hours as needed for pain.   Yes [provider]  aspirin 325 MG tablet Take 325 mg by mouth daily.   Yes [provider]  Azelastine HCl 0.15 % SOLN U 1 SPRAY IEN BID 03/15/19  Yes [provider]  cholecalciferol (VITAMIN D) 25 MCG (1000 UT) tablet Take 1,000 Units by mouth at bedtime.    Yes [provider]  famotidine  (PEPCID) 20 MG tablet Take 20 mg by mouth 2 (two) times daily.  05/20/18  Yes [provider]  fexofenadine (ALLEGRA) 180 MG tablet TK 1 T PO ONCE D 03/15/19  Yes [provider]  FLUoxetine (PROZAC) 10 MG capsule Take 10 mg by mouth daily.  09/09/18  Yes [provider]  guaiFENesin-dextromethorphan (ROBITUSSIN DM) 100-10 MG/5ML syrup Take 5 mLs by mouth every 4 (four) hours as needed for cough.   Yes [provider]  Lactobacillus (ACIDOPHILUS PO) Take by mouth.   Yes [provider]  Melatonin 5 MG TABS Take 5 mg by mouth at bedtime. Takes 2 3 mg tablets   Yes [provider]  primidone (MYSOLINE) 50 MG tablet Take 50-100 mg by mouth 2 (two) times daily. Take 2 tablets (100mg ) by mouth every morning and 1 tablet (50mg ) by mouth every night at bedtime   Yes [provider]  propranolol ER (INDERAL LA) 160 MG SR capsule Take 160 mg by mouth daily.    Yes [provider]  psyllium (HYDROCIL/METAMUCIL) 95 % PACK Take 1 packet by mouth daily.   Yes [provider]  tamsulosin (FLOMAX) 0.4 MG CAPS capsule Take by mouth.   Yes [provider]  UNABLE TO FIND APPLY FROM NECK DOWN DAILY 12/23/18  Yes [provider]  mirabegron ER (MYRBETRIQ) 50 MG TB24 tablet Take 1 tablet (50 mg total) by mouth daily. 10/10/19   Stoioff, Ronda Fairly, MD  Probiotic Product (VSL#3) CAPS Take 1 capsule by mouth daily.     [provider]    Family History Family History  Problem Relation Age of Onset  . Hematuria Father   . Prostate cancer Father   . Diabetes Father   . Heart disease Mother   . Diabetes Mother   . Kidney disease Neg Hx   . Bladder Cancer Neg Hx     Social History Social History   Tobacco Use  . Smoking status: Never Smoker  . Smokeless tobacco: Never Used  Substance Use Topics  . Alcohol use: No    Alcohol/week: 0.0 standard drinks  . Drug use: No     Allergies   Clindamycin, Flagyl  [metronidazole], Ambien  [zolpidem], Penicillins, and Sulfa antibiotics   Review of Systems Review of Systems   Physical Exam Triage Vital Signs ED Triage Vitals  Enc Vitals Group     BP 01/28/20 1832 (!) 141/79     Pulse Rate 01/28/20 1832 62     Resp 01/28/20 1832 20     Temp 01/28/20 1832 98.5 F (36.9 C)     Temp Source 01/28/20 1832 Oral     SpO2 01/28/20 1832 95 %     Weight --      Height --      Head Circumference --      Peak Flow --      Pain Score 01/28/20 1823 1     Pain Loc --      Pain Edu? --  Excl. in [place]? --    No data found.  Updated Vital Signs BP (!) 141/79 (BP Location: Right Arm)   Pulse 62   Temp 98.5 F (36.9 C) (Oral)   Resp 20   SpO2 95%   Visual Acuity Right Eye Distance:   Left Eye Distance:   Bilateral Distance:    Right Eye Near:   Left Eye Near:    Bilateral Near:     Physical Exam Vitals and nursing note reviewed.  Constitutional:      General: He is not in acute distress.    Appearance: He is not toxic-appearing or diaphoretic.  Musculoskeletal:     Left upper leg: Swelling and tenderness (left inner thigh tenderness to palpation; mild superificial skin erythema noted; no warmth) present. No lacerations.     Comments: Left lower extremity neurovascularly intact  Neurological:     Mental Status: He is alert.      UC Treatments / Results  Labs (all labs ordered are listed, but only abnormal results are displayed) Labs Reviewed - No data to display  EKG   Radiology No results found.  Procedures Procedures (including critical care time)  Medications Ordered in UC Medications - No data to display  Initial Impression / Assessment and Plan / UC Course  I have reviewed the triage vital signs and the nursing notes.  Pertinent labs & imaging results that were available during my care of the patient were reviewed by me and considered in my medical decision making (see chart for details).     Final Clinical  Impressions(s) / UC Diagnoses   Final diagnoses:  Leg pain, superior, left  Pain and swelling of left lower leg     Discharge Instructions     Recommend patient go to Emergency Department for further evaluation and management    ED Prescriptions    None      1. diagnosis reviewed with patient and wife; recommend patient go to Emergency Department for further evaluation/management for possible DVT. Patient verbalizes understanding and will proceed by private vehicle with wife driving.    PDMP not reviewed this encounter.   Norval Gable, MD 01/28/20 1902

## 2020-01-28 NOTE — ED Triage Notes (Signed)
Pt with redness and swelling to the L inner thigh area. Pt sent over from UC for ultra sound.

## 2020-01-29 ENCOUNTER — Emergency Department
Admission: EM | Admit: 2020-01-29 | Discharge: 2020-01-29 | Disposition: A | Discharge status: Home / Self Care | Payer: Medicare Other | Attending: Emergency Medicine | Admitting: Emergency Medicine

## 2020-01-29 ENCOUNTER — Telehealth (INDEPENDENT_AMBULATORY_CARE_PROVIDER_SITE_OTHER): Payer: Self-pay

## 2020-01-29 ENCOUNTER — Encounter: Payer: Medicare Other | Admitting: Physical Therapy

## 2020-01-29 DIAGNOSIS — I8002 Phlebitis and thrombophlebitis of superficial vessels of left lower extremity: Secondary | ICD-10-CM

## 2020-01-29 LAB — CBC
HCT: 41.7 % (ref 39.0–52.0)
Hemoglobin: 14.4 g/dL (ref 13.0–17.0)
MCH: 32.8 pg (ref 26.0–34.0)
MCHC: 34.5 g/dL (ref 30.0–36.0)
MCV: 95 fL (ref 80.0–100.0)
Platelets: 116 10*3/uL — ABNORMAL LOW (ref 150–400)
RBC: 4.39 MIL/uL (ref 4.22–5.81)
RDW: 14.8 % (ref 11.5–15.5)
WBC: 8.4 10*3/uL (ref 4.0–10.5)
nRBC: 0 % (ref 0.0–0.2)

## 2020-01-29 LAB — BASIC METABOLIC PANEL
Anion gap: 5 (ref 5–15)
BUN: 24 mg/dL — ABNORMAL HIGH (ref 8–23)
CO2: 23 mmol/L (ref 22–32)
Calcium: 9.6 mg/dL (ref 8.9–10.3)
Chloride: 107 mmol/L (ref 98–111)
Creatinine, Ser: 1.19 mg/dL (ref 0.61–1.24)
GFR calc Af Amer: >60 mL/min (ref >60)
GFR calc non Af Amer: 58 mL/min — ABNORMAL LOW (ref >60)
Glucose, Bld: 134 mg/dL — ABNORMAL HIGH (ref 70–99)
Potassium: 3.9 mmol/L (ref 3.5–5.1)
Sodium: 135 mmol/L (ref 135–145)

## 2020-01-29 MED ORDER — APIXABAN (ELIQUIS) EDUCATION KIT FOR DVT/PE PATIENTS: PACK | Freq: Once | Status: DC

## 2020-01-29 MED ORDER — RIVAROXABAN 15 MG PO TABS
15.0000 mg | ORAL_TABLET | Freq: Once | ORAL | Status: AC
  Administered 2020-01-29: 15 mg via ORAL
  Filled 2020-01-29: qty 1

## 2020-01-29 MED ORDER — RIVAROXABAN (XARELTO) VTE STARTER PACK (15 & 20 MG)
ORAL_TABLET | ORAL | 0 refills | Status: DC
Start: 2020-01-29 — End: 2020-05-07

## 2020-01-29 MED ORDER — APIXABAN 5 MG PO TABS: 10.0000 mg | ORAL_TABLET | Freq: Once | ORAL | Status: DC

## 2020-01-29 NOTE — ED Provider Notes (Signed)
[facility] Emergency Department Provider Note  ____________________________________________   First MD Initiated Contact with Patient 01/29/20 0106     (approximate)  I have reviewed the triage vital signs and the nursing notes.   HISTORY  Chief Complaint Leg Swelling    HPI Mike Holloway is a 79 y.o. male  With h/o AFlutter s/p ablation, Dm, HTN, CVA, here with left leg pain. Pt reports he noticed some mild pain in his left thigh today. THe pain was noticed when he was trying to move his leg and has been worsening throughout the day. He has a h/o CVA and requires help with many of his ADLs, but has also been less mobile than usual lately. He went to [facility] and was sent here for evaluation. Denies known h/o DVT. No CP, SOB. The leg has been mildly red but no drainage. No fever, chills. No other sx.        Past Medical History:  Diagnosis Date  . Arthritis   . Atrial flutter ([place])   . Diabetes mellitus ([place])   . Essential tremor   . Essential tremor    deep brain stimulator   . Hypercholesteremia   . Hypertension   . Incontinence   . 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 Sleep apnea   . Stroke [facility])     Patient Active Problem List   Diagnosis Date Noted  . Lymphedema 04/15/2019  . Bilateral leg edema 01/25/2019  . Hyponatremia 12/24/2018  . 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 Erosion of urethra due to catheterization of urinary tract ([place]) 10/27/2018  . Generalized weakness 10/14/2018  . Hip fracture ([place]) 09/15/2018  . 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 HLD (hyperlipidemia) 03/24/2018  . Contusion of right knee 02/14/2018  . Bradycardia 12/28/2017  . 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 Chronic pain of right knee 08/10/2016  . Right ankle pain 08/10/2016    . 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  . 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[place]) 12/11/2015  . Urinary retention 09/08/2015  . Lymphoma, 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[place]) 04/03/2015  . Breathlessness on exertion 11/21/2014  . Breath shortness 11/21/2014  . Arthropathy 11/07/2014  . Atrial flutter, paroxysmal ([place]) 11/07/2014  . Type 2 diabetes mellitus ([place]) 11/07/2014  . Benign essential tremor 11/07/2014  . Benign essential HTN 11/07/2014  . Mixed incontinence 11/07/2014  . Hypercholesterolemia without hypertriglyceridemia 11/07/2014  . Apnea, sleep 11/07/2014  . Controlled type 2 diabetes mellitus without complication ([place]) 0000000  . Pure hypercholesterolemia 11/07/2014  . Other abnormalities of gait and mobility 11/02/2011  . Decreased mobility 11/02/2011  . Abnormal gait 06/29/2011  . Discoordination 06/29/2011    Past Surgical History:  Procedure Laterality Date  . ABLATION    . APPENDECTOMY    . CATARACT EXTRACTION, BILATERAL    . COLONOSCOPY WITH PROPOFOL N/A 03/17/2018   Procedure: COLONOSCOPY WITH PROPOFOL;  Surgeon: Jonathon Bellows, MD;  Location: [facility] ENDOSCOPY;  Service: Gastroenterology;  Laterality: N/A;  . DEEP BRAIN STIMULATOR PLACEMENT    . 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 HEMORRHOID SURGERY    . HERNIA REPAIR    . HIP FRACTURE SURGERY    . INTRAMEDULLARY (IM) NAIL INTERTROCHANTERIC Right 09/16/2018   Procedure: INTRAMEDULLARY (IM) NAIL INTERTROCHANTRIC;  Surgeon:  Hessie Knows, MD;  Location: [facility] ORS;  Service: Orthopedics;  Laterality: Right;  . IR CATHETER TUBE CHANGE  11/22/2018  . NASAL SINUS SURGERY    . ORCHIECTOMY    . TONSILLECTOMY      Prior to Admission medications   Medication Sig Start Date End Date Taking? Authorizing Provider  acetaminophen (TYLENOL) 650 MG CR tablet Take 650 mg by mouth every 8 (eight) hours as needed for pain.     [provider]  aspirin 325 MG tablet Take 325 mg by mouth daily.    [provider]  Azelastine HCl 0.15 % SOLN U 1 SPRAY IEN BID 03/15/19   [provider]  cholecalciferol (VITAMIN D) 25 MCG (1000 UT) tablet Take 1,000 Units by mouth at bedtime.     [provider]  famotidine (PEPCID) 20 MG tablet Take 20 mg by mouth 2 (two) times daily.  05/20/18   [provider]  fexofenadine (ALLEGRA) 180 MG tablet TK 1 T PO ONCE D 03/15/19   [provider]  FLUoxetine (PROZAC) 10 MG capsule Take 10 mg by mouth daily.  09/09/18   [provider]  guaiFENesin-dextromethorphan (ROBITUSSIN DM) 100-10 MG/5ML syrup Take 5 mLs by mouth every 4 (four) hours as needed for cough.    [provider]  Lactobacillus (ACIDOPHILUS PO) Take by mouth.    [provider]  Melatonin 5 MG TABS Take 5 mg by mouth at bedtime. Takes 2 3 mg tablets    [provider]  mirabegron ER (MYRBETRIQ) 50 MG TB24 tablet Take 1 tablet (50 mg total) by mouth daily. 10/10/19   Stoioff, Ronda Fairly, MD  primidone (MYSOLINE) 50 MG tablet Take 50-100 mg by mouth 2 (two) times daily. Take 2 tablets (100mg ) by mouth every morning and 1 tablet (50mg ) by mouth every night at bedtime    [provider]  Probiotic Product (VSL#3) CAPS Take 1 capsule by mouth daily.     [provider]  propranolol ER (INDERAL LA) 160 MG SR capsule Take 160 mg by mouth daily.     [provider]  psyllium (HYDROCIL/METAMUCIL) 95 % PACK Take 1 packet by mouth daily.    [provider]  tamsulosin (FLOMAX) 0.4 MG CAPS capsule Take by mouth.    [provider]  UNABLE TO FIND APPLY FROM NECK DOWN DAILY 12/23/18   [provider]    Allergies Clindamycin, Flagyl [metronidazole], Ambien  [zolpidem], Penicillins, and Sulfa antibiotics  Family History  Problem Relation Age of Onset  . Hematuria Father   . Prostate cancer Father    . Diabetes Father   . Heart disease Mother   . Diabetes Mother   . Kidney disease Neg Hx   . Bladder Cancer Neg Hx     Social History Social History   Tobacco Use  . Smoking status: Never Smoker  . Smokeless tobacco: Never Used  Substance Use Topics  . Alcohol use: No    Alcohol/week: 0.0 standard drinks  . Drug use: No    Review of Systems  Review of Systems  Constitutional: Negative for chills, fatigue and fever.  HENT: Negative for sore throat.   Respiratory: Negative for shortness of breath.   Cardiovascular: Positive for leg swelling. Negative for chest pain.  Gastrointestinal: Negative for abdominal pain.  Genitourinary: Negative for flank pain.  Musculoskeletal: Negative for neck pain.  Skin: Positive for rash. Negative for wound.  Allergic/Immunologic: Negative for immunocompromised state.  Neurological: Negative  for weakness and numbness.  Hematological: Does not bruise/bleed easily.     ____________________________________________  PHYSICAL EXAM:      VITAL SIGNS: ED Triage Vitals [01/28/20 2047]  Enc Vitals Group     BP (!) 140/97     Pulse Rate 69     Resp 17     Temp 98.3 F (36.8 C)     Temp Source Oral     SpO2 97 %     Weight      Height      Head Circumference      Peak Flow      Pain Score      Pain Loc      Pain Edu?      Excl. in [place]?      Physical Exam Vitals and nursing note reviewed.  Constitutional:      General: He is not in acute distress.    Appearance: He is well-developed.  HENT:     Head: Normocephalic and atraumatic.  Eyes:     Conjunctiva/sclera: Conjunctivae normal.  Cardiovascular:     Rate and Rhythm: Normal rate and regular rhythm.     Heart sounds: Normal heart sounds. No murmur. No friction rub.  Pulmonary:     Effort: Pulmonary effort is normal. No respiratory distress.     Breath sounds: Normal breath sounds. No wheezing or rales.  Abdominal:     General: There is no distension.     Palpations:  Abdomen is soft.     Tenderness: There is no abdominal tenderness.  Musculoskeletal:     Cervical back: Neck supple.     Right lower leg: Edema (trace) present.     Left lower leg: Edema (trace) present.  Skin:    General: Skin is warm.     Capillary Refill: Capillary refill takes less than 2 seconds.     Comments: Minimal erythema and TTP noted along distribution of greater saphenous along inner left thigh. No overlying skin induration. No warmth. No fluctuance.  Neurological:     Mental Status: He is alert and oriented to person, place, and time.     Motor: No abnormal muscle tone.       ____________________________________________   LABS (all labs ordered are listed, but only abnormal results are displayed)  Labs Reviewed  CBC - Abnormal; Notable for the following components:      Result Value   Platelets 116 (*)    All other components within normal limits  BASIC METABOLIC PANEL - Abnormal; Notable for the following components:   Glucose, Bld 134 (*)    BUN 24 (*)    GFR calc non Af Amer 58 (*)    All other components within normal limits    ____________________________________________  EKG: None ________________________________________  RADIOLOGY All imaging, including plain films, CT scans, and ultrasounds, independently reviewed by me, and interpretations confirmed via formal radiology reads.  ED MD interpretation:   U/S: Superficial thrombophlebitis of GSV from thigh to distal to knee, no DVT  Official radiology report(s): US Venous Img Lower Unilateral Left  Result Date: 01/28/2020 CLINICAL DATA:  Left inner thigh redness and swelling EXAM: LEFT LOWER EXTREMITY VENOUS DOPPLER ULTRASOUND TECHNIQUE: Gray-scale sonography with compression, as well as color and duplex ultrasound, were performed to evaluate the deep venous system(s) from the level of the common femoral vein through the popliteal and proximal calf veins. COMPARISON:  None. FINDINGS: VENOUS Normal  compressibility of the common femoral, superficial femoral, and popliteal  veins, as well as the visualized calf veins. Visualized portions of profunda femoral vein unremarkable. No filling defects to suggest DVT on grayscale or color Doppler imaging. Doppler waveforms show normal direction of venous flow, normal respiratory plasticity and response to augmentation. There is thrombus seen within the right greater saphenous vein from the upper thigh region to just below the knee. Limited views of the contralateral common femoral vein are unremarkable. OTHER None. Limitations: none IMPRESSION: No evidence of left lower extremity DVT. Superficial thrombophlebitis of the left greater saphenous vein from the upper left thigh to just below the knee. Electronically Signed   By: Rolm Baptise M.D.   On: 01/28/2020 23:15    ____________________________________________  PROCEDURES   Procedure(s) performed (including Critical Care):  Procedures  ____________________________________________  INITIAL IMPRESSION / MDM / [place] / ED COURSE  As part of my medical decision making, I reviewed the following data within the [place] notes reviewed and incorporated, Old chart reviewed, Notes from prior ED visits, and [place] Controlled Substance Database       *Mike Holloway was evaluated in Emergency Department on 01/29/2020 for the symptoms described in the history of present illness. He was evaluated in the context of the global COVID-19 pandemic, which necessitated consideration that the patient might be at risk for infection with the SARS-CoV-2 virus that causes COVID-19. Institutional protocols and algorithms that pertain to the evaluation of patients at risk for COVID-19 are in a state of rapid change based on information released by regulatory bodies including the CDC and federal and state organizations. These policies and algorithms were followed during the patient's care  in the ED.  Some ED evaluations and interventions may be delayed as a result of limited staffing during the pandemic.*     Medical Decision Making:  79 yo M here with superficial thrombophlebitis of left leg. This involves a fairly extensive area spreading distal to the knee, and nears the femoral vein. Given length, will elect for initial treatment with anticoagulation and have him call his Vascular Surgeon, Dr. Delana Meyer, for further recommendations. Plt 116 so will need to be monitored, but otherwise no contraindications. Risks/benefits discussed. Of note, he is scheduled to get a SP cath in the near future - he is aware he will need to notify his MD of this prior to any procedures. Will rx Xarelto (eliquis interacts with his primidone) as an outpatient but advise him to call Vascular in the AM for further recommendations.  ____________________________________________  FINAL CLINICAL IMPRESSION(S) / ED DIAGNOSES  Final diagnoses:  Thrombophlebitis of superficial veins of left lower extremity     MEDICATIONS GIVEN DURING THIS VISIT:  Medications  Rivaroxaban (XARELTO) tablet 15 mg (has no administration in time range)     ED Discharge Orders    None       Note:  This document was prepared using Dragon voice recognition software and may include unintentional dictation errors.   Duffy Bruce, MD 01/29/20 [id number]

## 2020-01-29 NOTE — Discharge Instructions (Addendum)
Your labs and imaging showed a superficial clot in your left leg.  Given its size and length, we are going to start XARELTO, an anticoagulant.  For now, I would recommend the following: - STOP taking the full dose aspirin - START taking the Xarelto as per the prescription - CALL Dr. Delana Meyer in the morning to discuss further medication changes/adjustments - CALL Dr. Ellison Hughs as well to discuss, particularly whether or not you should be on your aspirin as well  APPLY a warm compress to the area 3-4x daily to help dissolve the clot  Try to move and stay as active as possible  Wear compression stockings

## 2020-01-29 NOTE — Telephone Encounter (Signed)
I looked at his test results and he doesn't have a DVT but he does have a superficial thrombophlebitis.  It is a clot, but it is superficial and generally not as dangerous.  If he has started xarelto that is good.  He should continue with xarelto as prescribed until he is seen in office.  He should elevate his leg, and use Ice/tylenol for pain and discomfort.

## 2020-02-04 ENCOUNTER — Telehealth (INDEPENDENT_AMBULATORY_CARE_PROVIDER_SITE_OTHER): Payer: Self-pay

## 2020-02-04 ENCOUNTER — Ambulatory Visit (INDEPENDENT_AMBULATORY_CARE_PROVIDER_SITE_OTHER): Payer: Medicare Other | Admitting: Vascular Surgery

## 2020-02-04 NOTE — Telephone Encounter (Signed)
Patient wife has been made aware with medical advice and verbalized understanding 

## 2020-02-04 NOTE — Telephone Encounter (Signed)
She should continue taking the medication as prescribed.  Superficial DVTs are painful and sore. It takes a few days for the medication to kick in but it should help .  He can take tylenol for pain

## 2020-02-05 ENCOUNTER — Ambulatory Visit: Payer: Medicare Other | Attending: Orthopedic Surgery | Admitting: Physical Therapy

## 2020-02-05 ENCOUNTER — Other Ambulatory Visit: Payer: Self-pay

## 2020-02-05 ENCOUNTER — Encounter: Payer: Self-pay | Admitting: Physical Therapy

## 2020-02-05 DIAGNOSIS — R269 Unspecified abnormalities of gait and mobility: Secondary | ICD-10-CM | POA: Diagnosis present

## 2020-02-05 DIAGNOSIS — R2689 Other abnormalities of gait and mobility: Secondary | ICD-10-CM

## 2020-02-05 DIAGNOSIS — R293 Abnormal posture: Secondary | ICD-10-CM | POA: Diagnosis present

## 2020-02-05 DIAGNOSIS — M6281 Muscle weakness (generalized): Secondary | ICD-10-CM

## 2020-02-05 NOTE — Therapy (Signed)
[facility] [address] [place], [place], [address] Phone: [phone]   Fax:  [phone]  Physical Therapy Treatment  Patient Details  Name: Key Cen MRN: [id number] Date of Birth: 1941/05/10 Referring Provider (PT): Dr. Rudene Christians   Encounter Date: 02/05/2020  PT End of Session - 02/05/20 1242    Visit Number  1    Number of Visits  63    Date for PT Re-Evaluation  03/04/20    Authorization - Visit Number  4    Authorization - Number of Visits  10    PT Start Time  0240    PT Stop Time  9735    PT Time Calculation (min)  62 min    Equipment Utilized During Treatment  Gait belt    Activity Tolerance  Patient tolerated treatment well;Patient limited by pain;Patient limited by fatigue    Behavior During Therapy  [facility] for tasks assessed/performed       Past Medical History:  Diagnosis Date  . Arthritis   . Atrial flutter ([place])   . Diabetes mellitus ([place])   . Essential tremor   . Essential tremor    deep brain stimulator   . Hypercholesteremia   . Hypertension   . Incontinence   . 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[place])    grew on the testical  . Sleep apnea   . Stroke [facility])     Past Surgical History:  Procedure Laterality Date  . ABLATION    . APPENDECTOMY    . CATARACT EXTRACTION, BILATERAL    . COLONOSCOPY WITH PROPOFOL N/A 03/17/2018   Procedure: COLONOSCOPY WITH PROPOFOL;  Surgeon: Jonathon Bellows, MD;  Location: [facility] ENDOSCOPY;  Service: Gastroenterology;  Laterality: N/A;  . DEEP BRAIN STIMULATOR PLACEMENT    . 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 HEMORRHOID SURGERY    . HERNIA REPAIR    . HIP FRACTURE SURGERY    . INTRAMEDULLARY (IM) NAIL INTERTROCHANTERIC Right 09/16/2018   Procedure: INTRAMEDULLARY (IM) NAIL INTERTROCHANTRIC;  Surgeon: Hessie Knows, MD;  Location: [facility] ORS;  Service:  Orthopedics;  Laterality: Right;  . IR CATHETER TUBE CHANGE  11/22/2018  . NASAL SINUS SURGERY    . ORCHIECTOMY    . TONSILLECTOMY      There were no vitals filed for this visit.  Subjective Assessment - 02/05/20 1241    Subjective  Pt. has been to ER over past week due to blood clot in L upper thigh.  Pt. put on Xarelto for blood clot.  Pt. presents with tenderness in L upper thigh but improving.  No contraindications noted.    Patient is accompained by:  Family member    Pertinent History  Pt. states R knee is bothering him but it is the typical discomfort he experiences    Limitations  House hold activities;Walking;Standing;Lifting    Patient Stated Goals  Improve LE strength/ gait and balance with daily tasks.  Improve gait/ safety/ progression to least assistive device.    Currently in Pain?  No/denies    Pain Onset  More than a month ago    Pain Onset  More than a month ago         Neuro.mm.:  Standing wt. Shifting L/R with no UE assist.   Ambulate in clinic with rollator and SBA for verbal cuing (posture/ BOS)- increase cadence with rollator while in hallway.  Amb. Outside with rollator but marked decrease in cadence/ increase UE pressure on rollator/ assist.   There.ex.:  Ascending/ descending stairs with step to gait x 3. Heavy UE assist required but safe technique.  Seated marching/ LAQ/ sit to stands with no resistance due to recent blood clot.  Nustep L5 10 min. B LE only. Consistent cadence.       PT Long Term Goals - 01/12/20 1703      PT LONG TERM GOAL #1   Title  Pt. independent with HEP to increase B hip flexion/ R quad muscle strength 1/2 muscle grade to improve pain-free mobility.    Baseline  R knee extension limited to 4/5 MMT secondary to pain/ fear of pain. 2/9: Deferred.  4/13: 4+/5 MMT (max potential).    Time  4    Period  Weeks    Status  Achieved    Target Date  01/08/20      PT LONG TERM GOAL #2   Title  Pt. will increase Berg balance test  to >40 out of 56 to improve independence with gait/ decrease fall risk.    Baseline  Berg: 22/56 (significant fall risk); 9/30 32/56.  11/10: 32 (limited by R knee pain)  2/9: 33/56 limited due to R knee and Low Back pain    Time  8    Period  Weeks    Status  Not Met    Target Date  03/04/20      PT LONG TERM GOAL #3   Title  Pt. able to ambulate 100 feet with consistent 2 point gait pattern and use of least restrictive assistive devce to improve household mobility.    Baseline  Pt. able to ambulate with use of RW/ CGA to min. A for safety.   Heavy use of B UE.; 9/30 pt able to ambulate with rollator with inconsistent 2 point gait pattern, heavy reliance on UE CGA/supervision  2/9: Pt. uses 2 point step pattern with walking with rollader with decreased stride length. Pt. would be limited due to pain in the knee walking for >100 ft.    Time  4    Period  Weeks    Status  Partially Met    Target Date  03/04/20      PT LONG TERM GOAL #4   Title  Pt. able to stand from normal chair with no UE assist to improve safety/independence with transfers.     Baseline  Unable to stand from standard chair without heavy UE assist. 10/6, pt unable to stand from standard chair without UE support.  11/10: benefits from 1 UE assist    2/9: Pt. can rise from normal chair with 1 UE assist and must control descent with UE assist.    Time  8    Period  Weeks    Status  Partially Met    Target Date  03/04/20      PT LONG TERM GOAL #5   Title  Pt. will perform a sit to stand with 1 UE support to be able to perform toileting activities with more independance.    Baseline  Pt. requires 2 UE to push from to sit to stand from standard chair height. Pt. has grab rails available but can not get up by pulling.    Time  8    Period  Weeks    Status  Partially Met    Target Date  03/04/20      Additional Long Term Goals  Additional Long Term Goals  Yes      PT LONG TERM GOAL #6   Title  Pt. will ascend/ descend 4  steps with use of B handrails and step pattern with mod. independence safely to be able to enter son's house.    Baseline  Step pattern with heavy UE assist and CGA/min. A for safety.    Time  8    Period  Weeks    Status  New    Target Date  03/04/20            Plan - 02/05/20 1242    Clinical Impression Statement  Pt. able to ascend/ descend 4 steps x 3 with use of handrails and step to pattern.  Pt. unable to complete stairs with recip. pattern secondary to R knee pain/limitaitons.  Pt. ambulates with increase cadence with rollator in hallway but marked decrease while ambulating on sidewalk/ down ramp/ in car.  Pt. able to stand with no UE assist and wt. shift for 1 minute x 4.    Examination-Activity Limitations  Toileting;Stand;Squat;Lift;Stairs    Stability/Clinical Decision Making  Evolving/Moderate complexity    Clinical Decision Making  Moderate    Rehab Potential  Fair    PT Frequency  1x / week    PT Duration  8 weeks    PT Treatment/Interventions  Moist Heat;Functional mobility training;Therapeutic activities;Therapeutic exercise;Balance training;Patient/family education;Manual techniques;Passive range of motion;Neuromuscular re-education;Stair training;Gait training;ADLs/Self Care Home Management    PT Next Visit Plan  Decrease sitting time/ increase standing and walking tolerance.   Dynamic balance tasks.    PT Home Exercise Plan  [id number]    Consulted and Agree with Plan of Care  Patient;Family member/caregiver    Family Member Consulted  spouse: [name]       Patient will benefit from skilled therapeutic intervention in order to improve the following deficits and impairments:  Abnormal gait, Decreased endurance, Decreased activity tolerance, Pain, Decreased balance, Impaired flexibility, Decreased strength, Postural dysfunction  Visit Diagnosis: Muscle weakness (generalized)  Gait difficulty  Balance problem  Abnormal posture     Problem List Patient Active  Problem List   Diagnosis Date Noted  . Lymphedema 04/15/2019  . Bilateral leg edema 01/25/2019  . Hyponatremia 12/24/2018  . SIADH (syndrome of inappropriate ADH production) ([place]) 12/24/2018  . Erosion of urethra due to catheterization of urinary tract ([place]) 10/27/2018  . Generalized weakness 10/14/2018  . Hip fracture ([place]) 09/15/2018  . 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 HLD (hyperlipidemia) 03/24/2018  . Contusion of right knee 02/14/2018  . Bradycardia 12/28/2017  . 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 Chronic pain of right knee 08/10/2016  . Right ankle pain 08/10/2016  . 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  . 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[place]) 12/11/2015  . Urinary retention 09/08/2015  . Lymphoma, 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[place]) 04/03/2015  . Breathlessness on exertion 11/21/2014  . Breath shortness 11/21/2014  . Arthropathy 11/07/2014  . Atrial flutter, paroxysmal ([place]) 11/07/2014  . Type 2 diabetes mellitus ([place]) 11/07/2014  . Benign essential tremor 11/07/2014  . Benign essential HTN 11/07/2014  . Mixed incontinence 11/07/2014  . Hypercholesterolemia without hypertriglyceridemia 11/07/2014  . Apnea, sleep 11/07/2014  . Controlled type 2 diabetes mellitus without complication ([place]) 78/29/5621  . Pure hypercholesterolemia 11/07/2014  . Other abnormalities of gait and mobility 11/02/2011  . Decreased mobility 11/02/2011  . Abnormal gait 06/29/2011  . Discoordination 06/29/2011   Legrand Como  Vernona Rieger, PT, DPT # [id number] 02/05/2020, 9:03 PM  [facility] [address] [place], [place], [address] Phone: [phone]   Fax:  [phone]  Name: Lavell Supple MRN: [id number] Date of Birth: 26-Oct-1940

## 2020-02-06 ENCOUNTER — Other Ambulatory Visit (INDEPENDENT_AMBULATORY_CARE_PROVIDER_SITE_OTHER): Payer: Self-pay | Admitting: Vascular Surgery

## 2020-02-06 DIAGNOSIS — I82412 Acute embolism and thrombosis of left femoral vein: Secondary | ICD-10-CM | POA: Insufficient documentation

## 2020-02-06 DIAGNOSIS — I8002 Phlebitis and thrombophlebitis of superficial vessels of left lower extremity: Secondary | ICD-10-CM

## 2020-02-06 NOTE — Progress Notes (Signed)
MRN : [id number]  Mike Holloway is a 79 y.o. (04/27/1941) male who presents with chief complaint of No chief complaint on file. [name]  History of Present Illness:   The patient returns to the office for followup evaluation regarding leg swelling.    He was recently seen in the emergency room for increased symptoms of the left thigh the swelling has improved quite a bit and the pain associated with swelling has decreased substantially.  Duplex ultrasound in the emergency room demonstrated superficial thrombophlebitis of the left great saphenous vein extending toward the groin.  Given this finding he was started on Xarelto.  Today he reports that the symptoms are significantly better.  He feels that his Xarelto has been a huge help.  There have not been any interval development of a ulcerations or wounds.  Since the previous visit the patient has been wearing graduated compression stockings and has noted fairly good control of the lymphedema. The patient has been using compression routinely morning until night.  The patient also states elevation during the day and exercise is being done too.  Previous venous duplex shows both deep and superficial reflux bilaterally.  Follow-up duplex ultrasound obtained today demonstrates acute on chronic changes within the great saphenous vein in the mid and distal thigh consistent with his improving superficial thrombophlebitis.  No evidence of deep venous thrombosis is noted.  No outpatient medications have been marked as taking for the 02/07/20 encounter (Appointment) with Delana Meyer, Dolores Lory, MD.    Past Medical History:  Diagnosis Date  . Arthritis   . Atrial flutter ([place])   . Diabetes mellitus ([place])   . Essential tremor   . Essential tremor    deep brain stimulator   . Hypercholesteremia   . Hypertension   . Incontinence   . 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 Sleep apnea   . Stroke [facility])     Past Surgical History:    Procedure Laterality Date  . ABLATION    . APPENDECTOMY    . CATARACT EXTRACTION, BILATERAL    . COLONOSCOPY WITH PROPOFOL N/A 03/17/2018   Procedure: COLONOSCOPY WITH PROPOFOL;  Surgeon: Jonathon Bellows, MD;  Location: [facility] ENDOSCOPY;  Service: Gastroenterology;  Laterality: N/A;  . DEEP BRAIN STIMULATOR PLACEMENT    . 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 HEMORRHOID SURGERY    . HERNIA REPAIR    . HIP FRACTURE SURGERY    . INTRAMEDULLARY (IM) NAIL INTERTROCHANTERIC Right 09/16/2018   Procedure: INTRAMEDULLARY (IM) NAIL INTERTROCHANTRIC;  Surgeon: Hessie Knows, MD;  Location: [facility] ORS;  Service: Orthopedics;  Laterality: Right;  . IR CATHETER TUBE CHANGE  11/22/2018  . NASAL SINUS SURGERY    . ORCHIECTOMY    . TONSILLECTOMY      Social History Social History   Tobacco Use  . Smoking status: Never Smoker  . Smokeless tobacco: Never Used  Substance Use Topics  . Alcohol use: No    Alcohol/week: 0.0 standard drinks  . Drug use: No    Family History Family History  Problem Relation Age of Onset  . Hematuria Father   . Prostate cancer Father   . Diabetes Father   . Heart disease Mother   . Diabetes Mother   . Kidney disease Neg Hx   . Bladder Cancer Neg Hx     Allergies  Allergen Reactions  . Clindamycin Diarrhea    Contracted C. Diff x 2  . Flagyl [Metronidazole] Swelling    Swollen tongue, excessive shaking,    . Ambien  [Zolpidem]     Other reaction(s): Other (See Comments) Disoriented and moody Other reaction(s): Other (See Comments) Disoriented and moody  . Penicillins Rash    Has patient had a PCN reaction causing immediate rash, facial/tongue/throat swelling, SOB or lightheadedness with hypotension: Unknown Has patient had a PCN reaction causing severe rash involving mucus membranes or skin necrosis: Unknown Has patient had a PCN  reaction that required hospitalization: No Has patient had a PCN reaction occurring within the last 10 years: No If all of the above answers are "NO", then may proceed with Cephalosporin use.  . Sulfa Antibiotics Rash     REVIEW OF SYSTEMS (Negative unless checked)  Constitutional: [] Weight loss  [] Fever  [] Chills Cardiac: [] Chest pain   [] Chest pressure   [] Palpitations   [] Shortness of breath when laying flat   [] Shortness of breath with exertion. Vascular:  [] Pain in legs with walking   [x] Pain in legs at rest  [] History of DVT   [] Phlebitis   [x] Swelling in legs   [] Varicose veins   [] Non-healing ulcers Pulmonary:   [] Uses home oxygen   [] Productive cough   [] Hemoptysis   [] Wheeze  [] COPD   [] Asthma Neurologic:  [] Dizziness   [] Seizures   [] History of stroke   [] History of TIA  [] Aphasia   [] Vissual changes   [] Weakness or numbness in arm   [] Weakness or numbness in leg Musculoskeletal:   [] Joint swelling   [x] Joint pain   [x] Low back pain Hematologic:  [] Easy bruising  [] Easy bleeding   [] Hypercoagulable state   [] Anemic Gastrointestinal:  [] Diarrhea   [] Vomiting  [] Gastroesophageal reflux/heartburn   [] Difficulty swallowing. Genitourinary:  [] Chronic kidney disease   [] Difficult urination  [] Frequent urination   [] Blood in urine Skin:  [] Rashes   [] Ulcers  Psychological:  [] History of anxiety   []  History of major depression.  Physical Examination  There were no vitals filed for this visit. There is no height or weight on file to calculate BMI. Gen: WD/WN, NAD Head: [place]/AT, No temporalis wasting.  Ear/Nose/Throat: Hearing grossly intact, nares w/o erythema or drainage Eyes: PER, EOMI, sclera nonicteric.  Neck: Supple, no large masses.   Pulmonary:  Good air movement, no audible wheezing bilaterally, no use of accessory muscles.  Cardiac: RRR, no JVD Vascular: scattered varicosities present bilaterally.  Mild venous stasis changes to the legs bilaterally.  2+ soft pitting  edema Vessel Right Left  Radial Palpable Palpable  Gastrointestinal: Non-distended. No guarding/no peritoneal signs.  Musculoskeletal: M/S 5/5 throughout.  No deformity or atrophy.  Neurologic: CN 2-12 intact. Symmetrical.  Speech is fluent. Motor exam as listed above. Psychiatric: Judgment intact, Mood & affect appropriate for pt's clinical situation. Dermatologic: No rashes or ulcers noted.  No changes consistent with cellulitis.   CBC Lab Results  Component Value Date   WBC 8.4 01/29/2020   HGB 14.4 01/29/2020   HCT 41.7 01/29/2020   MCV 95.0 01/29/2020   PLT 116 (L) 01/29/2020    BMET    Component Value Date/Time   NA 135 01/29/2020 0152   K 3.9 01/29/2020 0152   CL 107 01/29/2020 0152   CO2 23 01/29/2020 0152   GLUCOSE 134 (H) 01/29/2020 0152   BUN 24 (H) 01/29/2020 0152   CREATININE 1.19 01/29/2020 0152   CALCIUM 9.6 01/29/2020 [place]  58 (L) 01/29/2020 0152   GFRAA >60 01/29/2020 0152   Estimated Creatinine Clearance: 58 mL/min (by C-G formula based on SCr of 1.19 mg/dL).  COAG Lab Results  Component Value Date   INR 0.99 09/15/2018    Radiology US Venous Img Lower Unilateral Left  Result Date: 01/28/2020 CLINICAL DATA:  Left inner thigh redness and swelling EXAM: LEFT LOWER EXTREMITY VENOUS DOPPLER ULTRASOUND TECHNIQUE: Gray-scale sonography with compression, as well as color and duplex ultrasound, were performed to evaluate the deep venous system(s) from the level of the common femoral vein through the popliteal and proximal calf veins. COMPARISON:  None. FINDINGS: VENOUS Normal compressibility of the common femoral, superficial femoral, and popliteal veins, as well as the visualized calf veins. Visualized portions of profunda femoral vein unremarkable. No filling defects to suggest DVT on grayscale or color Doppler imaging. Doppler waveforms show normal direction of venous flow, normal respiratory plasticity and response to augmentation. There is thrombus  seen within the right greater saphenous vein from the upper thigh region to just below the knee. Limited views of the contralateral common femoral vein are unremarkable. OTHER None. Limitations: none IMPRESSION: No evidence of left lower extremity DVT. Superficial thrombophlebitis of the left greater saphenous vein from the upper left thigh to just below the knee. Electronically Signed   By: Rolm Baptise M.D.   On: 01/28/2020 23:15      Assessment/Plan 1. DVT femoral (deep venous thrombosis) with thrombophlebitis, left ([place]) The patient is significantly improved while on Xarelto therapy.  We discussed together including the patient his wife and myself continuing Xarelto for 6 weeks perhaps 2 months.  I will see him back in 6 weeks to see whether his symptoms have resolved and we will base how long he will remain on Xarelto on the presence or absence of symptoms at that time.  I also encouraged her to continue compression on a daily basis.  We did discuss the possibility of extending the compression to the high thigh or wearing a pantyhose type garment.  This would be exceedingly difficult given the patient's debility and inability to help with donning and doffing.  I then mentioned there are now low-dose protocols for anticoagulation if recurrent phlebitis becomes an issue.  He will return in approximately 6 weeks no studies are necessary at that time.  2. Chronic venous insufficiency No surgery or intervention at this point in time.    I have had a long discussion with the patient regarding venous insufficiency and why it  causes symptoms. I have discussed with the patient the chronic skin changes that accompany venous insufficiency and the long term sequela such as infection and ulceration.  Patient will begin wearing graduated compression stockings on a daily basis a prescription was given. The patient will put the stockings on first thing in the morning and removing them in the evening. The  patient is instructed specifically not to sleep in the stockings.    In addition, behavioral modification including several periods of elevation of the lower extremities during the day will be continued. I have demonstrated that proper elevation is a position with the ankles at heart level.  The patient is instructed to begin routine exercise, especially walking on a daily basis  3. Lymphedema No surgery or intervention at this point in time.    I have had a long discussion with the patient regarding venous insufficiency and why it  causes symptoms. I have discussed with the patient the chronic skin changes that  accompany venous insufficiency and the long term sequela such as infection and ulceration.  Patient will begin wearing graduated compression stockings on a daily basis a prescription was given. The patient will put the stockings on first thing in the morning and removing them in the evening. The patient is instructed specifically not to sleep in the stockings.    In addition, behavioral modification including several periods of elevation of the lower extremities during the day will be continued. I have demonstrated that proper elevation is a position with the ankles at heart level.  The patient is instructed to begin routine exercise, especially walking on a daily basis   4. Benign essential HTN Continue antihypertensive medications as already ordered, these medications have been reviewed and there are no changes at this time.   5. Type 2 diabetes mellitus without complication, with long-term current use of insulin (HCC) Continue hypoglycemic medications as already ordered, these medications have been reviewed and there are no changes at this time.  Hgb A1C to be monitored as already arranged by primary service    Hortencia Pilar, MD  02/06/2020 8:22 AM

## 2020-02-07 ENCOUNTER — Other Ambulatory Visit: Payer: Self-pay

## 2020-02-07 ENCOUNTER — Ambulatory Visit (INDEPENDENT_AMBULATORY_CARE_PROVIDER_SITE_OTHER): Payer: Medicare Other | Admitting: Vascular Surgery

## 2020-02-07 ENCOUNTER — Ambulatory Visit (INDEPENDENT_AMBULATORY_CARE_PROVIDER_SITE_OTHER): Payer: Medicare Other

## 2020-02-07 ENCOUNTER — Encounter (INDEPENDENT_AMBULATORY_CARE_PROVIDER_SITE_OTHER): Payer: Self-pay | Admitting: Vascular Surgery

## 2020-02-07 VITALS — BP 154/78 | HR 72 | Resp 17 | Ht 68.0 in | Wt 218.0 lb

## 2020-02-07 DIAGNOSIS — I8002 Phlebitis and thrombophlebitis of superficial vessels of left lower extremity: Secondary | ICD-10-CM

## 2020-02-07 DIAGNOSIS — I872 Venous insufficiency (chronic) (peripheral): Secondary | ICD-10-CM | POA: Diagnosis not present

## 2020-02-07 DIAGNOSIS — I82412 Acute embolism and thrombosis of left femoral vein: Secondary | ICD-10-CM

## 2020-02-07 DIAGNOSIS — I10 Essential (primary) hypertension: Secondary | ICD-10-CM

## 2020-02-07 DIAGNOSIS — I890 Lymphedema, not elsewhere classified: Secondary | ICD-10-CM | POA: Diagnosis not present

## 2020-02-07 DIAGNOSIS — Z794 Long term (current) use of insulin: Secondary | ICD-10-CM

## 2020-02-07 DIAGNOSIS — E119 Type 2 diabetes mellitus without complications: Secondary | ICD-10-CM

## 2020-02-07 MED ORDER — RIVAROXABAN 20 MG PO TABS: 20.0000 mg | ORAL_TABLET | Freq: Every day | ORAL | 1 refills | Status: DC

## 2020-02-12 ENCOUNTER — Encounter: Payer: Self-pay | Admitting: Physical Therapy

## 2020-02-12 ENCOUNTER — Ambulatory Visit: Payer: Medicare Other | Admitting: Physical Therapy

## 2020-02-12 ENCOUNTER — Other Ambulatory Visit: Payer: Self-pay

## 2020-02-12 DIAGNOSIS — R269 Unspecified abnormalities of gait and mobility: Secondary | ICD-10-CM

## 2020-02-12 DIAGNOSIS — M6281 Muscle weakness (generalized): Secondary | ICD-10-CM

## 2020-02-12 DIAGNOSIS — R2689 Other abnormalities of gait and mobility: Secondary | ICD-10-CM

## 2020-02-12 DIAGNOSIS — R293 Abnormal posture: Secondary | ICD-10-CM

## 2020-02-12 NOTE — Therapy (Signed)
[facility] [address]. [place], [place], [address] Phone: [phone]   Fax:  [phone]  Physical Therapy Treatment  Patient Details  Name: Mike Holloway MRN: [id number] Date of Birth: 03-20-1941 Referring Provider (PT): Dr. Rudene Christians   Encounter Date: 02/12/2020  PT End of Session - 02/12/20 1249    Visit Number  60    Number of Visits  61    Date for PT Re-Evaluation  03/04/20    Authorization - Visit Number  5    Authorization - Number of Visits  10    PT Start Time  6568    PT Stop Time  1401    PT Time Calculation (min)  66 min    Equipment Utilized During Treatment  Gait belt    Activity Tolerance  Patient tolerated treatment well;Patient limited by pain;Patient limited by fatigue    Behavior During Therapy  [facility] for tasks assessed/performed       Past Medical History:  Diagnosis Date  . Arthritis   . Atrial flutter ([place])   . Diabetes mellitus ([place])   . Essential tremor   . Essential tremor    deep brain stimulator   . Hypercholesteremia   . Hypertension   . Incontinence   . 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[place])    grew on the testical  . Sleep apnea   . Stroke [facility])     Past Surgical History:  Procedure Laterality Date  . ABLATION    . APPENDECTOMY    . CATARACT EXTRACTION, BILATERAL    . COLONOSCOPY WITH PROPOFOL N/A 03/17/2018   Procedure: COLONOSCOPY WITH PROPOFOL;  Surgeon: Jonathon Bellows, MD;  Location: [facility] ENDOSCOPY;  Service: Gastroenterology;  Laterality: N/A;  . DEEP BRAIN STIMULATOR PLACEMENT    . 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 HEMORRHOID SURGERY    . HERNIA REPAIR    . HIP FRACTURE SURGERY    . INTRAMEDULLARY (IM) NAIL INTERTROCHANTERIC Right 09/16/2018   Procedure: INTRAMEDULLARY (IM) NAIL INTERTROCHANTRIC;  Surgeon: Hessie Knows, MD;  Location: [facility] ORS;  Service:  Orthopedics;  Laterality: Right;  . IR CATHETER TUBE CHANGE  11/22/2018  . NASAL SINUS SURGERY    . ORCHIECTOMY    . TONSILLECTOMY      There were no vitals filed for this visit.  Subjective Assessment - 02/12/20 1249    Subjective  Pt. states L upper thigh is feeling better but tenderness present.  No c/o pain prior to PT tx.    Patient is accompained by:  Family member    Pertinent History  Pt. states R knee is bothering him but it is the typical discomfort he experiences    Limitations  House hold activities;Walking;Standing;Lifting    Patient Stated Goals  Improve LE strength/ gait and balance with daily tasks.  Improve gait/ safety/ progression to least assistive device.    Currently in Pain?  No/denies    Pain Onset  More than a month ago    Pain Onset  More than a month ago       Neuro.mm.:  Amb. In //-bars forward/backwards 2x.  Lateral walking in //-bars 4x. Standing B shoulder flexion 10x2 with no UE assist in //-bars.   Standing wt. Shifting L/R with no UE assist.  Added Airex to standing wt. Shift (//-bars)/ alternating step ups on Airex with UE required in //-bars.  Ambulate inclinicwith rollator and SBA for verbal cuing (posture/ BOS)- increase cadence with rollator while in hallway. Amb. Outside with rollator but marked decrease in cadence/ increase UE pressure on rollator/ assist.   There.ex.:  Seated LAQ/ marching 30x each.  Cuing for upright posture in gray chair.   Ascending/ descending 6" step x 10 (L and R). Heavy UE assist required but safe technique.  Cuing for mirror feedback.     Nustep L5 10 min. B LE only. Consistent cadence.MH to low back.     PT Long Term Goals - 01/12/20 1703      PT LONG TERM GOAL #1   Title  Pt. independent with HEP to increase B hip flexion/ R quad muscle strength 1/2 muscle grade to improve pain-free mobility.    Baseline  R knee extension limited to 4/5 MMT secondary to pain/ fear of pain. 2/9: Deferred.  4/13:  4+/5 MMT (max potential).    Time  4    Period  Weeks    Status  Achieved    Target Date  01/08/20      PT LONG TERM GOAL #2   Title  Pt. will increase Berg balance test to >40 out of 56 to improve independence with gait/ decrease fall risk.    Baseline  Berg: 22/56 (significant fall risk); 9/30 32/56.  11/10: 32 (limited by R knee pain)  2/9: 33/56 limited due to R knee and Low Back pain    Time  8    Period  Weeks    Status  Not Met    Target Date  03/04/20      PT LONG TERM GOAL #3   Title  Pt. able to ambulate 100 feet with consistent 2 point gait pattern and use of least restrictive assistive devce to improve household mobility.    Baseline  Pt. able to ambulate with use of RW/ CGA to min. A for safety.   Heavy use of B UE.; 9/30 pt able to ambulate with rollator with inconsistent 2 point gait pattern, heavy reliance on UE CGA/supervision  2/9: Pt. uses 2 point step pattern with walking with rollader with decreased stride length. Pt. would be limited due to pain in the knee walking for >100 ft.    Time  4    Period  Weeks    Status  Partially Met    Target Date  03/04/20      PT LONG TERM GOAL #4   Title  Pt. able to stand from normal chair with no UE assist to improve safety/independence with transfers.     Baseline  Unable to stand from standard chair without heavy UE assist. 10/6, pt unable to stand from standard chair without UE support.  11/10: benefits from 1 UE assist    2/9: Pt. can rise from normal chair with 1 UE assist and must control descent with UE assist.    Time  8    Period  Weeks    Status  Partially Met    Target Date  03/04/20      PT LONG TERM GOAL #5   Title  Pt. will perform a sit to stand with 1 UE support to be able to perform toileting activities with more independance.    Baseline  Pt. requires 2 UE to push from to sit to stand from standard chair height. Pt. has grab rails available but can not get up by pulling.    Time  8  Period  Weeks     Status  Partially Met    Target Date  03/04/20      Additional Long Term Goals   Additional Long Term Goals  Yes      PT LONG TERM GOAL #6   Title  Pt. will ascend/ descend 4 steps with use of B handrails and step pattern with mod. independence safely to be able to enter son's house.    Baseline  Step pattern with heavy UE assist and CGA/min. A for safety.    Time  8    Period  Weeks    Status  New    Target Date  03/04/20            Plan - 02/12/20 1250    Clinical Impression Statement  PT focus on standing tolerance/ wt. shifting without UE assist.  Pt. still requires heavy UE assist/ grasp on //-bars or handles on rollator.  No gait belt used today and pt. able to step up/down 6" step with improve quad control/ limited knee discomfort.  Pt. more confident with walking around PT clinic with use of rollator but extra time/ focus while desending ramp to car.  Pt. control stand to sits in gray chair without falling back in chair/ improve control of quads.  Pt. instructed to get outside and work on walking endurance in cul-de-sac.    Examination-Activity Limitations  Toileting;Stand;Squat;Lift;Stairs    Stability/Clinical Decision Making  Evolving/Moderate complexity    Clinical Decision Making  Moderate    Rehab Potential  Fair    PT Frequency  1x / week    PT Duration  8 weeks    PT Treatment/Interventions  Moist Heat;Functional mobility training;Therapeutic activities;Therapeutic exercise;Balance training;Patient/family education;Manual techniques;Passive range of motion;Neuromuscular re-education;Stair training;Gait training;ADLs/Self Care Home Management    PT Next Visit Plan  Decrease sitting time/ increase standing and walking tolerance.   Dynamic balance tasks.    PT Home Exercise Plan  [id number]    Consulted and Agree with Plan of Care  Patient;Family member/caregiver    Family Member Consulted  spouse: [name]       Patient will benefit from skilled therapeutic intervention in  order to improve the following deficits and impairments:  Abnormal gait, Decreased endurance, Decreased activity tolerance, Pain, Decreased balance, Impaired flexibility, Decreased strength, Postural dysfunction  Visit Diagnosis: Muscle weakness (generalized)  Gait difficulty  Balance problem  Abnormal posture     Problem List Patient Active Problem List   Diagnosis Date Noted  . DVT femoral (deep venous thrombosis) with thrombophlebitis, left ([place]) 02/06/2020  . Lymphedema 04/15/2019  . Bilateral leg edema 01/25/2019  . Hyponatremia 12/24/2018  . SIADH (syndrome of inappropriate ADH production) ([place]) 12/24/2018  . Erosion of urethra due to catheterization of urinary tract ([place]) 10/27/2018  . Generalized weakness 10/14/2018  . Hip fracture ([place]) 09/15/2018  . 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 HLD (hyperlipidemia) 03/24/2018  . Contusion of right knee 02/14/2018  . Bradycardia 12/28/2017  . 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 Tremor 09/04/2016  . Chronic pain of right knee 08/10/2016  . Right ankle pain 08/10/2016  . 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  . 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[place]) 12/11/2015  . Urinary retention 09/08/2015  . Lymphoma, 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[place]) 04/03/2015  . Breathlessness on exertion 11/21/2014  . Breath shortness 11/21/2014  . Arthropathy 11/07/2014  . Atrial flutter, paroxysmal ([place]) 11/07/2014  . Type 2 diabetes  mellitus ([place]) 11/07/2014  . Benign essential tremor 11/07/2014  . Benign essential HTN 11/07/2014  . Mixed incontinence 11/07/2014  . Hypercholesterolemia without hypertriglyceridemia 11/07/2014  . Apnea, sleep 11/07/2014  . Controlled type 2 diabetes mellitus without complication ([place]) 43/15/4008  . Pure hypercholesterolemia 11/07/2014  . Other abnormalities of gait and mobility 11/02/2011  . Decreased  mobility 11/02/2011  . Abnormal gait 06/29/2011  . Discoordination 06/29/2011   Pura Spice, PT, DPT # [id number] 02/12/2020, 8:42 PM  [place] [facility] [address] [place], [place], [address] Phone: [phone]   Fax:  [phone]  Name: Mike Holloway MRN: [id number] Date of Birth: 23-Aug-1941

## 2020-02-13 ENCOUNTER — Other Ambulatory Visit: Payer: Self-pay | Admitting: Urology

## 2020-02-19 ENCOUNTER — Telehealth: Payer: Self-pay

## 2020-02-19 ENCOUNTER — Telehealth (INDEPENDENT_AMBULATORY_CARE_PROVIDER_SITE_OTHER): Payer: Self-pay

## 2020-02-19 ENCOUNTER — Ambulatory Visit: Payer: Medicare Other | Admitting: Physical Therapy

## 2020-02-19 ENCOUNTER — Other Ambulatory Visit: Payer: Self-pay

## 2020-02-19 DIAGNOSIS — M6281 Muscle weakness (generalized): Secondary | ICD-10-CM | POA: Diagnosis not present

## 2020-02-19 DIAGNOSIS — R269 Unspecified abnormalities of gait and mobility: Secondary | ICD-10-CM

## 2020-02-19 DIAGNOSIS — R293 Abnormal posture: Secondary | ICD-10-CM

## 2020-02-19 DIAGNOSIS — R2689 Other abnormalities of gait and mobility: Secondary | ICD-10-CM

## 2020-02-19 NOTE — Telephone Encounter (Signed)
I would follow up with the urologist to see if the urine color change is caused by blood.  It may be blood or it could be some another cause.  So the primary goal would be to see what it is and what is causing it.  If it is indeed blood, we can hold the xarelto or possibly decrease the dosage.  Once urology does their workup

## 2020-02-19 NOTE — Telephone Encounter (Signed)
I would follow up with the urologist to see if the urine change is actually caused by blood or some other cause.  The primary goal would be to see what the cause is and where it is coming from.  If it is indeed blood, we can discuss with urology whether it would be best to hold or even decrease dosage of xarelto.

## 2020-02-19 NOTE — Telephone Encounter (Signed)
Incoming call from pt's wife who is concerned because pt's urine has gotten darker since beginning xarelto. Wife denies fever, chills, nausea, clots and bright red blood in patient. She states that urine is simply darker since he has taken blood thinner. Advised wife to increase hydration, also advised wife that darker colored urine can be a side effect of blood thinners. Advised her to continue to monitor urine, contact us immediately if pt develops clots, merlot colored urine, pain, fever, or chills. Wife gave verbal understanding.

## 2020-02-19 NOTE — Telephone Encounter (Signed)
pts wife called an left a message on the nurses line.The pt was last seen at out office on 5/13 for DVT and  Thrombophlebitis he as been taking xarelto for 3 wks  and is now having  [place] urin the pts wife called there urologist and made them aware but want to know what she needs to do since she said she read that if the urin turns this color contact you provider.

## 2020-02-19 NOTE — Telephone Encounter (Signed)
I called and left a voice mail for the pts wife  with the detail from the NP.

## 2020-02-21 ENCOUNTER — Encounter: Payer: Self-pay | Admitting: Physical Therapy

## 2020-02-21 ENCOUNTER — Telehealth: Payer: Self-pay

## 2020-02-21 NOTE — Therapy (Signed)
[place] [facility] [address]. [place], [place], [address] Phone: [phone]   Fax:  [phone]  Physical Therapy Treatment  Patient Details  Name: Mike Holloway MRN: [id number] Date of Birth: 1941-01-13 Referring Provider (PT): Dr. Rudene Christians   Encounter Date: 02/19/2020  PT End of Session - 02/21/20 0703    Visit Number  47    Number of Visits  63    Date for PT Re-Evaluation  03/04/20    Authorization - Visit Number  6    Authorization - Number of Visits  10    PT Start Time  1301    PT Stop Time  1402    PT Time Calculation (min)  61 min    Equipment Utilized During Treatment  Gait belt    Activity Tolerance  Patient tolerated treatment well;Patient limited by pain;Patient limited by fatigue    Behavior During Therapy  [facility] for tasks assessed/performed       Past Medical History:  Diagnosis Date  . Arthritis   . Atrial flutter ([place])   . Diabetes mellitus ([place])   . Essential tremor   . Essential tremor    deep brain stimulator   . Hypercholesteremia   . Hypertension   . Incontinence   . 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 Sleep apnea   . Stroke [facility])     Past Surgical History:  Procedure Laterality Date  . ABLATION    . APPENDECTOMY    . CATARACT EXTRACTION, BILATERAL    . COLONOSCOPY WITH PROPOFOL N/A 03/17/2018   Procedure: COLONOSCOPY WITH PROPOFOL;  Surgeon: Jonathon Bellows, MD;  Location: [facility] ENDOSCOPY;  Service: Gastroenterology;  Laterality: N/A;  . DEEP BRAIN STIMULATOR PLACEMENT    . 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 HEMORRHOID SURGERY    . HERNIA REPAIR    . HIP FRACTURE SURGERY    . INTRAMEDULLARY (IM) NAIL INTERTROCHANTERIC Right 09/16/2018   Procedure: INTRAMEDULLARY (IM) NAIL INTERTROCHANTRIC;  Surgeon: Hessie Knows, MD;  Location: [facility] ORS;  Service:  Orthopedics;  Laterality: Right;  . IR CATHETER TUBE CHANGE  11/22/2018  . NASAL SINUS SURGERY    . ORCHIECTOMY    . TONSILLECTOMY      There were no vitals filed for this visit.  Subjective Assessment - 02/21/20 0701    Subjective  Pt. entered PT with heavy UE use on rollator.  No c/o falls or balance problems.  Pts. wife states that pt. has been c/o increase wrist discomfort (due to heavy UE assist on rollator).    Patient is accompained by:  Family member    Pertinent History  Pt. states R knee is bothering him but it is the typical discomfort he experiences    Limitations  House hold activities;Walking;Standing;Lifting    Patient Stated Goals  Improve LE strength/ gait and balance with daily tasks.  Improve gait/ safety/ progression to least assistive device.    Currently in Pain?  No/denies    Pain Onset  More than a month ago    Pain Onset  More than a month ago         Neuro.mm.:  Amb. In //-bars forward/backwards 3x.  Lateral walking in //-bars 5x.  Seated rest break. Standing B shoulder flexion 10x2 with no UE assist in //-bars.   Standing wt. Shifting L/R with  no UE assist.6" step touches/ green hurdles in //-bars.   Ambulate inclinicwith rollator and SBA for verbal cuing (posture/ BOS)- increase cadence with rollator while in hallway. Amb. Outside with rollator but marked decrease in cadence/ increase UE pressure on rollator/ assist.  There.ex.:  Nustep L5 10 min. B LE only. Consistent cadence.MH to low back. Seated LAQ/ marching 30x each.  Cuing for upright posture in gray chair.   Seated L/R wrist stretches (flexor/ extensors)- 3x each with 20 sec. Static holds.  (elbow ext.) Standing partial lunges in //-bars 10x L/R.   Nustep L5 10 min. B LE only. Consistent cadence.MH to low back.     PT Education - 02/21/20 0702    Education Details  B wrist stretches to decrease joint pain    Person(s) Educated  Patient;Spouse    Methods   Explanation;Demonstration    Comprehension  Verbalized understanding;Returned demonstration          PT Long Term Goals - 01/12/20 1703      PT LONG TERM GOAL #1   Title  Pt. independent with HEP to increase B hip flexion/ R quad muscle strength 1/2 muscle grade to improve pain-free mobility.    Baseline  R knee extension limited to 4/5 MMT secondary to pain/ fear of pain. 2/9: Deferred.  4/13: 4+/5 MMT (max potential).    Time  4    Period  Weeks    Status  Achieved    Target Date  01/08/20      PT LONG TERM GOAL #2   Title  Pt. will increase Berg balance test to >40 out of 56 to improve independence with gait/ decrease fall risk.    Baseline  Berg: 22/56 (significant fall risk); 9/30 32/56.  11/10: 32 (limited by R knee pain)  2/9: 33/56 limited due to R knee and Low Back pain    Time  8    Period  Weeks    Status  Not Met    Target Date  03/04/20      PT LONG TERM GOAL #3   Title  Pt. able to ambulate 100 feet with consistent 2 point gait pattern and use of least restrictive assistive devce to improve household mobility.    Baseline  Pt. able to ambulate with use of RW/ CGA to min. A for safety.   Heavy use of B UE.; 9/30 pt able to ambulate with rollator with inconsistent 2 point gait pattern, heavy reliance on UE CGA/supervision  2/9: Pt. uses 2 point step pattern with walking with rollader with decreased stride length. Pt. would be limited due to pain in the knee walking for >100 ft.    Time  4    Period  Weeks    Status  Partially Met    Target Date  03/04/20      PT LONG TERM GOAL #4   Title  Pt. able to stand from normal chair with no UE assist to improve safety/independence with transfers.     Baseline  Unable to stand from standard chair without heavy UE assist. 10/6, pt unable to stand from standard chair without UE support.  11/10: benefits from 1 UE assist    2/9: Pt. can rise from normal chair with 1 UE assist and must control descent with UE assist.    Time  8     Period  Weeks    Status  Partially Met    Target Date  03/04/20  PT LONG TERM GOAL #5   Title  Pt. will perform a sit to stand with 1 UE support to be able to perform toileting activities with more independance.    Baseline  Pt. requires 2 UE to push from to sit to stand from standard chair height. Pt. has grab rails available but can not get up by pulling.    Time  8    Period  Weeks    Status  Partially Met    Target Date  03/04/20      Additional Long Term Goals   Additional Long Term Goals  Yes      PT LONG TERM GOAL #6   Title  Pt. will ascend/ descend 4 steps with use of B handrails and step pattern with mod. independence safely to be able to enter son's house.    Baseline  Step pattern with heavy UE assist and CGA/min. A for safety.    Time  8    Period  Weeks    Status  New    Target Date  03/04/20            Plan - 02/21/20 0703    Clinical Impression Statement  Moderate cuing to increase upright standing posture while walking in //-bars/ rollator in clinic.  Pt. unable to relax use of hands/ UE on assistive device due c/o incresae R knee discomfort/ fear of losing balance.  Pt. works hard during tx. session and has more confidence/ increase cadence in PT clinic.  Pt. requires extra time with walking outside, esp. walking down ramp/ into van.    Examination-Activity Limitations  Toileting;Stand;Squat;Lift;Stairs    Stability/Clinical Decision Making  Evolving/Moderate complexity    Clinical Decision Making  Moderate    Rehab Potential  Fair    PT Frequency  1x / week    PT Duration  8 weeks    PT Treatment/Interventions  Moist Heat;Functional mobility training;Therapeutic activities;Therapeutic exercise;Balance training;Patient/family education;Manual techniques;Passive range of motion;Neuromuscular re-education;Stair training;Gait training;ADLs/Self Care Home Management    PT Next Visit Plan  Decrease sitting time/ increase standing and walking tolerance.    Dynamic balance tasks.    PT Home Exercise Plan  [email or web address]    Consulted and Agree with Plan of Care  Patient;Family member/caregiver    Family Member Consulted  spouse: [name]       Patient will benefit from skilled therapeutic intervention in order to improve the following deficits and impairments:  Abnormal gait, Decreased endurance, Decreased activity tolerance, Pain, Decreased balance, Impaired flexibility, Decreased strength, Postural dysfunction  Visit Diagnosis: Muscle weakness (generalized)  Gait difficulty  Balance problem  Abnormal posture     Problem List Patient Active Problem List   Diagnosis Date Noted  . DVT femoral (deep venous thrombosis) with thrombophlebitis, left ([place]) 02/06/2020  . Lymphedema 04/15/2019  . Bilateral leg edema 01/25/2019  . Hyponatremia 12/24/2018  . SIADH (syndrome of inappropriate ADH production) ([place]) 12/24/2018  . Erosion of urethra due to catheterization of urinary tract ([place]) 10/27/2018  . Generalized weakness 10/14/2018  . Hip fracture ([place]) 09/15/2018  . 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 HLD (hyperlipidemia) 03/24/2018  . Contusion of right knee 02/14/2018  . Bradycardia 12/28/2017  . 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 Tremor 09/04/2016  . Chronic pain of right knee 08/10/2016  . Right ankle pain 08/10/2016  . 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  . 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[place]) 12/11/2015  .  Urinary retention 09/08/2015  . Lymphoma, 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[place]) 04/03/2015  . Breathlessness on exertion 11/21/2014  . Breath shortness 11/21/2014  . Arthropathy 11/07/2014  . Atrial flutter, paroxysmal ([place]) 11/07/2014  . Type 2 diabetes mellitus ([place]) 11/07/2014  . Benign essential tremor 11/07/2014  . Benign essential HTN 11/07/2014  . Mixed incontinence 11/07/2014  . Hypercholesterolemia without  hypertriglyceridemia 11/07/2014  . Apnea, sleep 11/07/2014  . Controlled type 2 diabetes mellitus without complication ([place]) 84/69/6295  . Pure hypercholesterolemia 11/07/2014  . Other abnormalities of gait and mobility 11/02/2011  . Decreased mobility 11/02/2011  . Abnormal gait 06/29/2011  . Discoordination 06/29/2011   Pura Spice, PT, DPT # [id number] 02/21/2020, 7:14 AM  [place] [facility] [address] [place], [place], [address] Phone: [phone]   Fax:  [phone]  Name: Mike Holloway MRN: [id number] Date of Birth: March 11, 1941

## 2020-02-21 NOTE — Telephone Encounter (Signed)
Incoming call from pt's wife who states that hematuria has completely resolved. Pt's urine is clear and yellow she wanted to make [place] aware.

## 2020-02-26 ENCOUNTER — Ambulatory Visit: Payer: Medicare Other | Attending: Orthopedic Surgery

## 2020-02-26 ENCOUNTER — Other Ambulatory Visit: Payer: Self-pay

## 2020-02-26 ENCOUNTER — Encounter: Payer: Self-pay | Admitting: Physical Therapy

## 2020-02-26 DIAGNOSIS — M6281 Muscle weakness (generalized): Secondary | ICD-10-CM

## 2020-02-26 DIAGNOSIS — R2689 Other abnormalities of gait and mobility: Secondary | ICD-10-CM | POA: Diagnosis present

## 2020-02-26 DIAGNOSIS — R293 Abnormal posture: Secondary | ICD-10-CM

## 2020-02-26 DIAGNOSIS — R269 Unspecified abnormalities of gait and mobility: Secondary | ICD-10-CM | POA: Diagnosis present

## 2020-02-26 NOTE — Therapy (Signed)
[place] [facility] [address]. [place], [place], [address] Phone: [phone]   Fax:  [phone]  Physical Therapy Treatment  Patient Details  Name: Mike Holloway MRN: [id number] Date of Birth: 05-May-1941 Referring Provider (PT): Dr. Rudene Christians   Encounter Date: 02/26/2020  PT End of Session - 02/26/20 1357    Visit Number  44    Number of Visits  63    Date for PT Re-Evaluation  03/04/20    Authorization - Visit Number  7    Authorization - Number of Visits  10    PT Start Time  1300    PT Stop Time  1400    PT Time Calculation (min)  60 min    Equipment Utilized During Treatment  Gait belt    Activity Tolerance  Patient tolerated treatment well;Patient limited by pain;Patient limited by fatigue    Behavior During Therapy  [facility] for tasks assessed/performed       Past Medical History:  Diagnosis Date  . Arthritis   . Atrial flutter ([place])   . Diabetes mellitus ([place])   . Essential tremor   . Essential tremor    deep brain stimulator   . Hypercholesteremia   . Hypertension   . Incontinence   . 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[place])    grew on the testical  . Sleep apnea   . Stroke [facility])     Past Surgical History:  Procedure Laterality Date  . ABLATION    . APPENDECTOMY    . CATARACT EXTRACTION, BILATERAL    . COLONOSCOPY WITH PROPOFOL N/A 03/17/2018   Procedure: COLONOSCOPY WITH PROPOFOL;  Surgeon: Jonathon Bellows, MD;  Location: [facility] ENDOSCOPY;  Service: Gastroenterology;  Laterality: N/A;  . DEEP BRAIN STIMULATOR PLACEMENT    . 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 HEMORRHOID SURGERY    . HERNIA REPAIR    . HIP FRACTURE SURGERY    . INTRAMEDULLARY (IM) NAIL INTERTROCHANTERIC Right 09/16/2018   Procedure: INTRAMEDULLARY (IM) NAIL INTERTROCHANTRIC;  Surgeon: Hessie Knows, MD;  Location: [facility] ORS;  Service:  Orthopedics;  Laterality: Right;  . IR CATHETER TUBE CHANGE  11/22/2018  . NASAL SINUS SURGERY    . ORCHIECTOMY    . TONSILLECTOMY      There were no vitals filed for this visit.  Subjective Assessment - 02/26/20 1305    Subjective  Pt reported some chronic pain at start of session. Been trying to walk more with his son, 1-2x a week, including HEP.    Patient is accompained by:  Family member    Pertinent History  Pt. states R knee is bothering him but it is the typical discomfort he experiences    Limitations  House hold activities;Walking;Standing;Lifting    Patient Stated Goals  Improve LE strength/ gait and balance with daily tasks.  Improve gait/ safety/ progression to least assistive device.    Currently in Pain?  Yes   Pt reported L hip, L foot pain, L knee pain   Pain Score  3    pt reported 3/10 with weight bearing         Neuro.mm.:   Amb. In //-bars forward/backwards 3x.  Lateral walking in //-bars 5x.  Seated rest break. Standing B shoulder flexion 10x2 with no UE assist in //-bars.    Standing tolerance x44mn after lifting arms Standing wt. Shifting L/R with  no UE assist. x15 Step over hurdles tall /small/tall/ small/small x2 rounds (down and back)  6" step touches, focus on unilateral UE support  Ambulate in clinic with rollator and SBA for verbal cuing (posture/ BOS)- increase cadence with rollator while in hallway.   There.ex.: Nustep L5 10 min. B LE only. Consistent cadence.  MH to low back. Seated LAQ/ marching 30x each.  Cuing for upright posture in gray chair.       Pt response/clinical impression: The patient reported pain in R knee with any weight bearing today and increased low back pain with standing endurance activities. Focused on cueing pt to decrease reliance on UE support during weight bearing, pt stated he was fearful of R knee giving out due to fatigue at the end of the session. Pt cued for maximum encouragement throughout activities. The patient  would benefit from further skilled PT intervention to continue to progress towards goals.      PT Education - 02/26/20 1353    Education Details  exercise form/technique    Person(s) Educated  Patient;Spouse    Methods  Explanation;Demonstration    Comprehension  Verbalized understanding;Returned demonstration          PT Long Term Goals - 01/12/20 1703      PT LONG TERM GOAL #1   Title  Pt. independent with HEP to increase B hip flexion/ R quad muscle strength 1/2 muscle grade to improve pain-free mobility.    Baseline  R knee extension limited to 4/5 MMT secondary to pain/ fear of pain. 2/9: Deferred.  4/13: 4+/5 MMT (max potential).    Time  4    Period  Weeks    Status  Achieved    Target Date  01/08/20      PT LONG TERM GOAL #2   Title  Pt. will increase Berg balance test to >40 out of 56 to improve independence with gait/ decrease fall risk.    Baseline  Berg: 22/56 (significant fall risk); 9/30 32/56.  11/10: 32 (limited by R knee pain)  2/9: 33/56 limited due to R knee and Low Back pain    Time  8    Period  Weeks    Status  Not Met    Target Date  03/04/20      PT LONG TERM GOAL #3   Title  Pt. able to ambulate 100 feet with consistent 2 point gait pattern and use of least restrictive assistive devce to improve household mobility.    Baseline  Pt. able to ambulate with use of RW/ CGA to min. A for safety.   Heavy use of B UE.; 9/30 pt able to ambulate with rollator with inconsistent 2 point gait pattern, heavy reliance on UE CGA/supervision  2/9: Pt. uses 2 point step pattern with walking with rollader with decreased stride length. Pt. would be limited due to pain in the knee walking for >100 ft.    Time  4    Period  Weeks    Status  Partially Met    Target Date  03/04/20      PT LONG TERM GOAL #4   Title  Pt. able to stand from normal chair with no UE assist to improve safety/independence with transfers.     Baseline  Unable to stand from standard chair without  heavy UE assist. 10/6, pt unable to stand from standard chair without UE support.  11/10: benefits from 1 UE assist    2/9: Pt. can rise from  normal chair with 1 UE assist and must control descent with UE assist.    Time  8    Period  Weeks    Status  Partially Met    Target Date  03/04/20      PT LONG TERM GOAL #5   Title  Pt. will perform a sit to stand with 1 UE support to be able to perform toileting activities with more independance.    Baseline  Pt. requires 2 UE to push from to sit to stand from standard chair height. Pt. has grab rails available but can not get up by pulling.    Time  8    Period  Weeks    Status  Partially Met    Target Date  03/04/20      Additional Long Term Goals   Additional Long Term Goals  Yes      PT LONG TERM GOAL #6   Title  Pt. will ascend/ descend 4 steps with use of B handrails and step pattern with mod. independence safely to be able to enter son's house.    Baseline  Step pattern with heavy UE assist and CGA/min. A for safety.    Time  8    Period  Weeks    Status  New    Target Date  03/04/20            Plan - 02/26/20 1353    Clinical Impression Statement  The patient reported pain in R knee with any weight bearing today and increased low back pain with standing endurance activities. Focused on cueing pt to decrease reliance on UE support during weight bearing, pt stated he was fearful of R knee giving out due to fatigue at the end of the session. Pt cued for maximum encouragement throughout activities. The patient would benefit from further skilled PT intervention to continue to progress towards goals.    Examination-Activity Limitations  Toileting;Stand;Squat;Lift;Stairs    Stability/Clinical Decision Making  Evolving/Moderate complexity    Rehab Potential  Fair    PT Frequency  1x / week    PT Duration  8 weeks    PT Treatment/Interventions  Moist Heat;Functional mobility training;Therapeutic activities;Therapeutic exercise;Balance  training;Patient/family education;Manual techniques;Passive range of motion;Neuromuscular re-education;Stair training;Gait training;ADLs/Self Care Home Management    PT Next Visit Plan  Decrease sitting time/ increase standing and walking tolerance.   Dynamic balance tasks.    PT Home Exercise Plan  [id number]    Consulted and Agree with Plan of Care  Patient;Family member/caregiver    Family Member Consulted  spouse: [name]       Patient will benefit from skilled therapeutic intervention in order to improve the following deficits and impairments:  Abnormal gait, Decreased endurance, Decreased activity tolerance, Pain, Decreased balance, Impaired flexibility, Decreased strength, Postural dysfunction  Visit Diagnosis: Muscle weakness (generalized)  Gait difficulty  Balance problem  Abnormal posture     Problem List Patient Active Problem List   Diagnosis Date Noted  . DVT femoral (deep venous thrombosis) with thrombophlebitis, left ([place]) 02/06/2020  . Lymphedema 04/15/2019  . Bilateral leg edema 01/25/2019  . Hyponatremia 12/24/2018  . SIADH (syndrome of inappropriate ADH production) ([place]) 12/24/2018  . Erosion of urethra due to catheterization of urinary tract ([place]) 10/27/2018  . Generalized weakness 10/14/2018  . Hip fracture ([place]) 09/15/2018  . 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 HLD (hyperlipidemia) 03/24/2018  . Contusion of right  knee 02/14/2018  . Bradycardia 12/28/2017  . 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 Tremor 09/04/2016  . Chronic pain of right knee 08/10/2016  . Right ankle pain 08/10/2016  . 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  . 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[place]) 12/11/2015  . Urinary retention 09/08/2015  . Lymphoma, 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[place]) 04/03/2015  . Breathlessness on exertion 11/21/2014  . Breath shortness 11/21/2014  .  Arthropathy 11/07/2014  . Atrial flutter, paroxysmal ([place]) 11/07/2014  . Type 2 diabetes mellitus ([place]) 11/07/2014  . Benign essential tremor 11/07/2014  . Benign essential HTN 11/07/2014  . Mixed incontinence 11/07/2014  . Hypercholesterolemia without hypertriglyceridemia 11/07/2014  . Apnea, sleep 11/07/2014  . Controlled type 2 diabetes mellitus without complication ([place]) 16/06/9603  . Pure hypercholesterolemia 11/07/2014  . Other abnormalities of gait and mobility 11/02/2011  . Decreased mobility 11/02/2011  . Abnormal gait 06/29/2011  . Discoordination 06/29/2011    Lieutenant Diego PT, DPT 3:20 PM,02/26/20   [place] [facility] [address] [place], [place], [address] Phone: [phone]   Fax:  [phone]  Name: Makena Mcgrady MRN: [id number] Date of Birth: 16-Mar-1941

## 2020-03-04 ENCOUNTER — Other Ambulatory Visit: Payer: Self-pay

## 2020-03-04 ENCOUNTER — Encounter: Payer: Self-pay | Admitting: Physical Therapy

## 2020-03-04 ENCOUNTER — Ambulatory Visit: Payer: Medicare Other | Admitting: Physical Therapy

## 2020-03-04 DIAGNOSIS — R2689 Other abnormalities of gait and mobility: Secondary | ICD-10-CM

## 2020-03-04 DIAGNOSIS — R269 Unspecified abnormalities of gait and mobility: Secondary | ICD-10-CM

## 2020-03-04 DIAGNOSIS — M6281 Muscle weakness (generalized): Secondary | ICD-10-CM | POA: Diagnosis not present

## 2020-03-04 DIAGNOSIS — R293 Abnormal posture: Secondary | ICD-10-CM

## 2020-03-04 NOTE — Therapy (Signed)
[place] [facility] [address]. [place], [place], [address] Phone: [phone]   Fax:  [phone]  Physical Therapy Treatment  Patient Details  Name: Mike Holloway MRN: [id number] Date of Birth: August 27, 1941 Referring Provider (PT): Dr. Rudene Christians   Encounter Date: 03/04/2020  PT End of Session - 03/04/20 1258    Visit Number  19    Number of Visits  63    Date for PT Re-Evaluation  03/04/20    Authorization - Visit Number  8    Authorization - Number of Visits  10    PT Start Time  2761    PT Stop Time  4709    PT Time Calculation (min)  60 min    Equipment Utilized During Treatment  Gait belt    Activity Tolerance  Patient tolerated treatment well;Patient limited by pain;Patient limited by fatigue    Behavior During Therapy  [facility] for tasks assessed/performed       Past Medical History:  Diagnosis Date  . Arthritis   . Atrial flutter ([place])   . Diabetes mellitus ([place])   . Essential tremor   . Essential tremor    deep brain stimulator   . Hypercholesteremia   . Hypertension   . Incontinence   . 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 Sleep apnea   . Stroke [facility])     Past Surgical History:  Procedure Laterality Date  . ABLATION    . APPENDECTOMY    . CATARACT EXTRACTION, BILATERAL    . COLONOSCOPY WITH PROPOFOL N/A 03/17/2018   Procedure: COLONOSCOPY WITH PROPOFOL;  Surgeon: Jonathon Bellows, MD;  Location: [facility] ENDOSCOPY;  Service: Gastroenterology;  Laterality: N/A;  . DEEP BRAIN STIMULATOR PLACEMENT    . 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 HEMORRHOID SURGERY    . HERNIA REPAIR    . HIP FRACTURE SURGERY    . INTRAMEDULLARY (IM) NAIL INTERTROCHANTERIC Right 09/16/2018   Procedure: INTRAMEDULLARY (IM) NAIL INTERTROCHANTRIC;  Surgeon: Hessie Knows, MD;  Location: [facility] ORS;  Service:  Orthopedics;  Laterality: Right;  . IR CATHETER TUBE CHANGE  11/22/2018  . NASAL SINUS SURGERY    . ORCHIECTOMY    . TONSILLECTOMY      There were no vitals filed for this visit.  Subjective Assessment - 03/04/20 1349    Subjective  Pt reported ambulating with son for exercise. Pt continues to have R knee pain and LBP with activity. Pt's wife explains pt is having L great toe and L third toe pain. Family plans to contact Dr. Jens Som for podiatry appointment. Pt ambulated into clinic with rollator.    Patient is accompained by:  Family member    Pertinent History  Pt. states R knee is bothering him but it is the typical discomfort he experiences    Limitations  House hold activities;Walking;Standing;Lifting    Patient Stated Goals  Improve LE strength/ gait and balance with daily tasks.  Improve gait/ safety/ progression to least assistive device.    Currently in Pain?  No/denies   no subjective pain score given   Pain Onset  More than a month ago    Pain Onset  More than a month ago        There.ex:   Walking in // bars with UE support: forward/backwards (6.5 minutes to improve standing tolerance) Seated  LAQ's/marches #5 ankle weights: 1x20 reps each Walking in // bars forwards/backwards/side-stepping with 5# ankle weights and UE support: 3 min Amb around gym with rollator #5 ankle weights: 2 min Asc/desc stairs B handrails: 4 steps asc/desc x1 Nu-Step (LE's only): L5 with hot pack on lumbar spine for 10 min  Neuro:   Standing multi-directional ball toss (pink soccer ball): 2 min  Standing multi-directional functional reach with cones: 2 min Standing fishing: 2 min. total Sit to stand from blue mat table with min. UE assist.        PT Education - 03/04/20 1352    Education Details  exercise form/technique    Person(s) Educated  Patient    Methods  Explanation         PT Long Term Goals - 01/12/20 1703      PT LONG TERM GOAL #1   Title  Pt. independent with HEP to  increase B hip flexion/ R quad muscle strength 1/2 muscle grade to improve pain-free mobility.    Baseline  R knee extension limited to 4/5 MMT secondary to pain/ fear of pain. 2/9: Deferred.  4/13: 4+/5 MMT (max potential).    Time  4    Period  Weeks    Status  Achieved    Target Date  01/08/20      PT LONG TERM GOAL #2   Title  Pt. will increase Berg balance test to >40 out of 56 to improve independence with gait/ decrease fall risk.    Baseline  Berg: 22/56 (significant fall risk); 9/30 32/56.  11/10: 32 (limited by R knee pain)  2/9: 33/56 limited due to R knee and Low Back pain    Time  8    Period  Weeks    Status  Not Met    Target Date  03/04/20      PT LONG TERM GOAL #3   Title  Pt. able to ambulate 100 feet with consistent 2 point gait pattern and use of least restrictive assistive devce to improve household mobility.    Baseline  Pt. able to ambulate with use of RW/ CGA to min. A for safety.   Heavy use of B UE.; 9/30 pt able to ambulate with rollator with inconsistent 2 point gait pattern, heavy reliance on UE CGA/supervision  2/9: Pt. uses 2 point step pattern with walking with rollader with decreased stride length. Pt. would be limited due to pain in the knee walking for >100 ft.    Time  4    Period  Weeks    Status  Partially Met    Target Date  03/04/20      PT LONG TERM GOAL #4   Title  Pt. able to stand from normal chair with no UE assist to improve safety/independence with transfers.     Baseline  Unable to stand from standard chair without heavy UE assist. 10/6, pt unable to stand from standard chair without UE support.  11/10: benefits from 1 UE assist    2/9: Pt. can rise from normal chair with 1 UE assist and must control descent with UE assist.    Time  8    Period  Weeks    Status  Partially Met    Target Date  03/04/20      PT LONG TERM GOAL #5   Title  Pt. will perform a sit to stand with 1 UE support to be able to perform toileting activities with more  independance.  Baseline  Pt. requires 2 UE to push from to sit to stand from standard chair height. Pt. has grab rails available but can not get up by pulling.    Time  8    Period  Weeks    Status  Partially Met    Target Date  03/04/20      Additional Long Term Goals   Additional Long Term Goals  Yes      PT LONG TERM GOAL #6   Title  Pt. will ascend/ descend 4 steps with use of B handrails and step pattern with mod. independence safely to be able to enter son's house.    Baseline  Step pattern with heavy UE assist and CGA/min. A for safety.    Time  8    Period  Weeks    Status  New    Target Date  03/04/20         Plan - 03/04/20 1343    Clinical Impression Statement  Pt arrived ambulating via rollator and heavy UE assist.  Pt was able to tolerate ambulating into parallel bars with no AD at beginning of session for 6.5 minutes before requiring a seated rest break. Pt able to perform bouts of standing tolerance exercises ranging from 2-3 minutes tolerance of activity prior to fatigue and R knee pain and LBP requiring further seated rest breaks. With standing activities pt tends to lean to the left secondary to R knee issues/pain. With asc/desc stairs pt displays step to pattern with use of bilateral hand rails. Pt displays faitgue after resistance exercises and standing tolerance activities requiring further skilled PT treatment.    Examination-Activity Limitations  Toileting;Stand;Squat;Lift;Stairs    Stability/Clinical Decision Making  Evolving/Moderate complexity    Clinical Decision Making  Moderate    Rehab Potential  Fair    PT Frequency  1x / week    PT Duration  8 weeks    PT Treatment/Interventions  Moist Heat;Functional mobility training;Therapeutic activities;Therapeutic exercise;Balance training;Patient/family education;Manual techniques;Passive range of motion;Neuromuscular re-education;Stair training;Gait training;ADLs/Self Care Home Management    PT Next Visit Plan   Decrease sitting time/ increase standing and walking tolerance.   Dynamic balance tasks.  RECERT next. tx./ CHECK goals.    PT Home Exercise Plan  [email or web address]    Consulted and Agree with Plan of Care  Patient;Family member/caregiver    Family Member Consulted  spouse: [name]       Patient will benefit from skilled therapeutic intervention in order to improve the following deficits and impairments:  Abnormal gait, Decreased endurance, Decreased activity tolerance, Pain, Decreased balance, Impaired flexibility, Decreased strength, Postural dysfunction  Visit Diagnosis: Muscle weakness (generalized)  Gait difficulty  Balance problem  Abnormal posture     Problem List Patient Active Problem List   Diagnosis Date Noted  . DVT femoral (deep venous thrombosis) with thrombophlebitis, left ([place]) 02/06/2020  . Lymphedema 04/15/2019  . Bilateral leg edema 01/25/2019  . Hyponatremia 12/24/2018  . SIADH (syndrome of inappropriate ADH production) ([place]) 12/24/2018  . Erosion of urethra due to catheterization of urinary tract ([place]) 10/27/2018  . Generalized weakness 10/14/2018  . Hip fracture ([place]) 09/15/2018  . 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 HLD (hyperlipidemia) 03/24/2018  . Contusion of right knee 02/14/2018  . Bradycardia 12/28/2017  . 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 Tremor 09/04/2016  . Chronic pain of right knee 08/10/2016  . Right ankle pain  08/10/2016  . 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  . 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[place]) 12/11/2015  . Urinary retention 09/08/2015  . Lymphoma, 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[place]) 04/03/2015  . Breathlessness on exertion 11/21/2014  . Breath shortness 11/21/2014  . Arthropathy 11/07/2014  . Atrial flutter, paroxysmal ([place]) 11/07/2014  . Type 2 diabetes mellitus ([place]) 11/07/2014  . Benign essential tremor 11/07/2014  .  Benign essential HTN 11/07/2014  . Mixed incontinence 11/07/2014  . Hypercholesterolemia without hypertriglyceridemia 11/07/2014  . Apnea, sleep 11/07/2014  . Controlled type 2 diabetes mellitus without complication ([place]) 70/44/9252  . Pure hypercholesterolemia 11/07/2014  . Other abnormalities of gait and mobility 11/02/2011  . Decreased mobility 11/02/2011  . Abnormal gait 06/29/2011  . Discoordination 06/29/2011   Pura Spice, PT, DPT # [id number] SPT 03/04/2020, 3:11 PM  [place] [facility] [address] [place], [place], [address] Phone: [phone]   Fax:  [phone]  Name: Jones Viviani MRN: [id number] Date of Birth: 1941/05/30

## 2020-03-11 ENCOUNTER — Ambulatory Visit: Payer: Medicare Other | Admitting: Physical Therapy

## 2020-03-11 ENCOUNTER — Other Ambulatory Visit: Payer: Self-pay

## 2020-03-11 DIAGNOSIS — R269 Unspecified abnormalities of gait and mobility: Secondary | ICD-10-CM

## 2020-03-11 DIAGNOSIS — M6281 Muscle weakness (generalized): Secondary | ICD-10-CM | POA: Diagnosis not present

## 2020-03-11 DIAGNOSIS — R293 Abnormal posture: Secondary | ICD-10-CM

## 2020-03-11 DIAGNOSIS — R2689 Other abnormalities of gait and mobility: Secondary | ICD-10-CM

## 2020-03-12 ENCOUNTER — Encounter: Payer: Self-pay | Admitting: Physical Therapy

## 2020-03-12 NOTE — Therapy (Signed)
[facility] [address] [place], [place], [address] Phone: [phone]   Fax:  [phone]  Physical Therapy Treatment  Patient Details  Name: Mike Holloway MRN: [id number] Date of Birth: Jan 27, 1941 Referring Provider (PT): Dr. Rudene Christians   Encounter Date: 03/11/2020   PT End of Session - 03/12/20 1006    Visit Number 38    Number of Visits 72    Date for PT Re-Evaluation 05/07/20    Authorization - Visit Number 1    Authorization - Number of Visits 10    PT Start Time 2585    PT Stop Time 2778    PT Time Calculation (min) 63 min    Equipment Utilized During Treatment Gait belt    Activity Tolerance Patient tolerated treatment well;Patient limited by pain;Patient limited by fatigue    Behavior During Therapy [facility] for tasks assessed/performed           Past Medical History:  Diagnosis Date  . Arthritis   . Atrial flutter ([place])   . Diabetes mellitus ([place])   . Essential tremor   . Essential tremor    deep brain stimulator   . Hypercholesteremia   . Hypertension   . Incontinence   . 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[place])    grew on the testical  . Sleep apnea   . Stroke [facility])     Past Surgical History:  Procedure Laterality Date  . ABLATION    . APPENDECTOMY    . CATARACT EXTRACTION, BILATERAL    . COLONOSCOPY WITH PROPOFOL N/A 03/17/2018   Procedure: COLONOSCOPY WITH PROPOFOL;  Surgeon: Jonathon Bellows, MD;  Location: [facility] ENDOSCOPY;  Service: Gastroenterology;  Laterality: N/A;  . DEEP BRAIN STIMULATOR PLACEMENT    . 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 HEMORRHOID SURGERY    . HERNIA REPAIR    . HIP FRACTURE SURGERY    . INTRAMEDULLARY (IM) NAIL INTERTROCHANTERIC Right 09/16/2018   Procedure: INTRAMEDULLARY (IM) NAIL INTERTROCHANTRIC;  Surgeon: Hessie Knows, MD;  Location: [facility] ORS;  Service:  Orthopedics;  Laterality: Right;  . IR CATHETER TUBE CHANGE  11/22/2018  . NASAL SINUS SURGERY    . ORCHIECTOMY    . TONSILLECTOMY      There were no vitals filed for this visit.   Subjective Assessment - 03/11/20 1356    Subjective Pt ambulated into clinic with rollator. No R knee or L great toe pain prior to treatment.    Patient is accompained by: Family member    Pertinent History Pt. states R knee is bothering him but it is the typical discomfort he experiences    Limitations House hold activities;Walking;Standing;Lifting    Patient Stated Goals Improve LE strength/ gait and balance with daily tasks.  Improve gait/ safety/ progression to least assistive device.    Pain Score 0-No pain    Pain Onset More than a month ago    Pain Onset More than a month ago            There.ex:   Nu-Step: L3 for 10 min with hot pack on lumbar spine. Use of UE/LE's  // bars:  Walking forwards and backwards: x3 with mod verbal cueing to reduce UE support to improve BLE standing tolerance Side-steps: x2 with mod verbal cueing to reduce UE support to  Improve BLE standing tolerance. Increased difficulty noted with side-stepping with increased BUE  support needed.  Step-ups on 6' step: 2x5 with LLE, 1x5 with RUE. Increase in R knee pain and LBP due to RLE weakness.   STS with airex pad on seat: 1x5 with BUE support  STS with airex pad on seat: 1x3 with single UE support  Neuro Re-ed:   B forward weight shifts with BUE support in // bars: 1x8  Ambulating 1 lap in gym with VC's to increase B stride length to increase gait speed. Visual cue of use of agility ladder to improve stride length to improve gait speed to improve gait efficiency thus reducing fatigue and risk of falls. Ambulating outside down incline to vehicle focusing on BLE eccentric control and consistent heel strike. Min Verbal cues to focus on increased, equal stride length and good eccentric control of quads.    PT Long Term Goals -  03/12/20 1442      PT LONG TERM GOAL #1   Title Pt. independent with HEP to increase B hip flexion/ R quad muscle strength 1/2 muscle grade to improve pain-free mobility.    Baseline R knee extension limited to 4/5 MMT secondary to pain/ fear of pain. 2/9: Deferred.  4/13: 4+/5 MMT (max potential).    Time 4    Period Weeks    Status Achieved    Target Date 01/08/20      PT LONG TERM GOAL #2   Title Pt. will increase Berg balance test to >40 out of 56 to improve independence with gait/ decrease fall risk.    Baseline Berg: 22/56 (significant fall risk); 9/30 32/56.  11/10: 32 (limited by R knee pain)  2/9: 33/56 limited due to R knee and Low Back pain    Time 8    Period Weeks    Status Not Met    Target Date 05/07/20      PT LONG TERM GOAL #3   Title Pt. able to ambulate 100 feet with consistent 2 point gait pattern and use of least restrictive assistive devce to improve household mobility.    Baseline Pt. able to ambulate with use of RW/ CGA to min. A for safety.   Heavy use of B UE.; 9/30 pt able to ambulate with rollator with inconsistent 2 point gait pattern, heavy reliance on UE CGA/supervision  2/9: Pt. uses 2 point step pattern with walking with rollader with decreased stride length. Pt. would be limited due to pain in the knee walking for >100 ft.    Time 4    Period Weeks    Status Partially Met    Target Date 05/07/20      PT LONG TERM GOAL #4   Title Pt. able to stand from normal chair with no UE assist to improve safety/independence with transfers.     Baseline Unable to stand from standard chair without heavy UE assist. 10/6, pt unable to stand from standard chair without UE support.  11/10: benefits from 1 UE assist    2/9: Pt. can rise from normal chair with 1 UE assist and must control descent with UE assist.    Time 8    Period Weeks    Status Partially Met      PT LONG TERM GOAL #5   Title Pt. will perform a sit to stand with 1 UE support to be able to perform  toileting activities with more independance.    Baseline Pt. requires 2 UE to push from to sit to stand from standard chair height. Pt. has grab  rails available but can not get up by pulling.    Time 8    Period Weeks    Status Partially Met    Target Date 05/07/20      PT LONG TERM GOAL #6   Title Pt. will ascend/ descend 4 steps with use of B handrails and step pattern with mod. independence safely to be able to enter son's house.    Baseline Step pattern with heavy UE assist and CGA/min. A for safety.    Time 8    Period Weeks    Status Partially Met    Target Date 05/07/20            Plan - 03/11/20 1358    Clinical Impression Statement Pt ambulating into clinic with rollator. Pt relies heavily on UE support for rollator during ambulation due to BLE weakness. Pt improving standing tolerance in // bars being able to tolerate 2x12 cone tap marching. Pt performed 1x5 sit to stands with airex pad in seat requiring BUE support for standing. Pt able to perform 2 STS's with one UE support requiring x2 attempts to stand with increased difficulty. With 6" step-ups pt displayed LBP and R knee pain with stepping up with R knee. During gait pt required VC's and visual cues of agility ladder of floor to improve B step length. Pt can continue to benefit from skilled PT treatment to improve standing tolerance, balance, LE strength, and gait to reduce risk of falls and improve ADL's.    Examination-Activity Limitations Toileting;Stand;Squat;Lift;Stairs    Stability/Clinical Decision Making Evolving/Moderate complexity    Clinical Decision Making Moderate    Rehab Potential Fair    PT Frequency 1x / week    PT Duration 8 weeks    PT Treatment/Interventions Moist Heat;Functional mobility training;Therapeutic activities;Therapeutic exercise;Balance training;Patient/family education;Manual techniques;Passive range of motion;Neuromuscular re-education;Stair training;Gait training;ADLs/Self Care Home  Management    PT Next Visit Plan Decrease sitting time/ increase standing and walking tolerance.   Dynamic balance tasks.    PT Home Exercise Plan [email or web address]    Consulted and Agree with Plan of Care Patient;Family member/caregiver    Family Member Consulted spouse: [name]           Patient will benefit from skilled therapeutic intervention in order to improve the following deficits and impairments:  Abnormal gait, Decreased endurance, Decreased activity tolerance, Pain, Decreased balance, Impaired flexibility, Decreased strength, Postural dysfunction  Visit Diagnosis: Muscle weakness (generalized)  Gait difficulty  Balance problem  Abnormal posture     Problem List Patient Active Problem List   Diagnosis Date Noted  . DVT femoral (deep venous thrombosis) with thrombophlebitis, left ([place]) 02/06/2020  . Lymphedema 04/15/2019  . Bilateral leg edema 01/25/2019  . Hyponatremia 12/24/2018  . SIADH (syndrome of inappropriate ADH production) ([place]) 12/24/2018  . Erosion of urethra due to catheterization of urinary tract ([place]) 10/27/2018  . Generalized weakness 10/14/2018  . Hip fracture ([place]) 09/15/2018  . 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 HLD (hyperlipidemia) 03/24/2018  . Contusion of right knee 02/14/2018  . Bradycardia 12/28/2017  . 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 Tremor 09/04/2016  . Chronic pain of right knee 08/10/2016  . Right ankle pain 08/10/2016  . 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  . 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[place]) 12/11/2015  . Urinary retention 09/08/2015  . Lymphoma, 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[place]) 04/03/2015  . Breathlessness on exertion 11/21/2014  . Breath shortness 11/21/2014  .  Arthropathy 11/07/2014  . Atrial flutter, paroxysmal ([place]) 11/07/2014  . Type 2 diabetes mellitus ([place]) 11/07/2014  . Benign essential tremor 11/07/2014  .  Benign essential HTN 11/07/2014  . Mixed incontinence 11/07/2014  . Hypercholesterolemia without hypertriglyceridemia 11/07/2014  . Apnea, sleep 11/07/2014  . Controlled type 2 diabetes mellitus without complication ([place]) 93/96/8864  . Pure hypercholesterolemia 11/07/2014  . Other abnormalities of gait and mobility 11/02/2011  . Decreased mobility 11/02/2011  . Abnormal gait 06/29/2011  . Discoordination 06/29/2011   Pura Spice, PT, DPT # [id number] SPT 03/12/2020, 2:57 PM  [facility] [address] [place], [place], [address] Phone: [phone]   Fax:  [phone]  Name: Mike Holloway MRN: [id number] Date of Birth: 1941/04/17

## 2020-03-18 ENCOUNTER — Other Ambulatory Visit: Payer: Self-pay

## 2020-03-18 ENCOUNTER — Encounter: Payer: Self-pay | Admitting: Physical Therapy

## 2020-03-18 ENCOUNTER — Ambulatory Visit: Payer: Medicare Other | Admitting: Physical Therapy

## 2020-03-18 DIAGNOSIS — R293 Abnormal posture: Secondary | ICD-10-CM

## 2020-03-18 DIAGNOSIS — R2689 Other abnormalities of gait and mobility: Secondary | ICD-10-CM

## 2020-03-18 DIAGNOSIS — M6281 Muscle weakness (generalized): Secondary | ICD-10-CM | POA: Diagnosis not present

## 2020-03-18 DIAGNOSIS — R269 Unspecified abnormalities of gait and mobility: Secondary | ICD-10-CM

## 2020-03-18 NOTE — Therapy (Signed)
[facility] [address] [place], [place], [address] Phone: [phone]   Fax:  [phone]  Physical Therapy Treatment  Patient Details  Name: Mike Holloway MRN: [id number] Date of Birth: 12/28/1940 Referring Provider (PT): Dr. Rudene Christians   Encounter Date: 03/18/2020   PT End of Session - 03/18/20 1359    Visit Number 65    Number of Visits 72    Date for PT Re-Evaluation 05/07/20    Authorization - Visit Number 2    Authorization - Number of Visits 10    PT Start Time 5465    PT Stop Time 1400    PT Time Calculation (min) 52 min    Equipment Utilized During Treatment Gait belt    Activity Tolerance Patient tolerated treatment well;Patient limited by pain;Patient limited by fatigue    Behavior During Therapy [facility] for tasks assessed/performed           Past Medical History:  Diagnosis Date  . Arthritis   . Atrial flutter ([place])   . Diabetes mellitus ([place])   . Essential tremor   . Essential tremor    deep brain stimulator   . Hypercholesteremia   . Hypertension   . Incontinence   . 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 Sleep apnea   . Stroke [facility])     Past Surgical History:  Procedure Laterality Date  . ABLATION    . APPENDECTOMY    . CATARACT EXTRACTION, BILATERAL    . COLONOSCOPY WITH PROPOFOL N/A 03/17/2018   Procedure: COLONOSCOPY WITH PROPOFOL;  Surgeon: Jonathon Bellows, MD;  Location: [facility] ENDOSCOPY;  Service: Gastroenterology;  Laterality: N/A;  . DEEP BRAIN STIMULATOR PLACEMENT    . 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 HEMORRHOID SURGERY    . HERNIA REPAIR    . HIP FRACTURE SURGERY    . INTRAMEDULLARY (IM) NAIL INTERTROCHANTERIC Right 09/16/2018   Procedure: INTRAMEDULLARY (IM) NAIL INTERTROCHANTRIC;  Surgeon: Hessie Knows, MD;  Location: [facility] ORS;  Service:  Orthopedics;  Laterality: Right;  . IR CATHETER TUBE CHANGE  11/22/2018  . NASAL SINUS SURGERY    . ORCHIECTOMY    . TONSILLECTOMY      There were no vitals filed for this visit.   Subjective Assessment - 03/18/20 1354    Subjective Pt reports pain in R knee and R hip rated at 4/10 NPS.    Patient is accompained by: Family member    Pertinent History Pt. states R knee is bothering him but it is the typical discomfort he experiences    Limitations House hold activities;Walking;Standing;Lifting    Patient Stated Goals Improve LE strength/ gait and balance with daily tasks.  Improve gait/ safety/ progression to least assistive device.    Currently in Pain? Yes    Pain Score 4     Pain Location Knee    Pain Orientation Right    Pain Descriptors / Indicators Aching    Pain Type Acute pain    Pain Onset Today    Multiple Pain Sites Yes    Pain Location Hip    Pain Descriptors / Indicators Aching    Pain Onset More than a month ago          There.ex:  Nu-Step: L5 for 10 min with use of UE's/LE's  // bars:  Walking forwards/backwards with  UE support: x4 Standing marching 1x15  Standing hip abduction 1x15  Neuro Re-ed:   Standing fishing: w/ focus on improving standing tolerance x2, 10 fish Standing tic-tac-toe: 2 min 15 sec, 2 min with seated rest break in between Gait: use of floor ladder as visual cue to practice equal step length x2 with 2 laps around therapy gym with cues with less BUE reliance on rollator.     PT Long Term Goals - 03/12/20 1442      PT LONG TERM GOAL #1   Title Pt. independent with HEP to increase B hip flexion/ R quad muscle strength 1/2 muscle grade to improve pain-free mobility.    Baseline R knee extension limited to 4/5 MMT secondary to pain/ fear of pain. 2/9: Deferred.  4/13: 4+/5 MMT (max potential).    Time 4    Period Weeks    Status Achieved    Target Date 01/08/20      PT LONG TERM GOAL #2   Title Pt. will increase Berg balance test to  >40 out of 56 to improve independence with gait/ decrease fall risk.    Baseline Berg: 22/56 (significant fall risk); 9/30 32/56.  11/10: 32 (limited by R knee pain)  2/9: 33/56 limited due to R knee and Low Back pain    Time 8    Period Weeks    Status Not Met    Target Date 05/07/20      PT LONG TERM GOAL #3   Title Pt. able to ambulate 100 feet with consistent 2 point gait pattern and use of least restrictive assistive devce to improve household mobility.    Baseline Pt. able to ambulate with use of RW/ CGA to min. A for safety.   Heavy use of B UE.; 9/30 pt able to ambulate with rollator with inconsistent 2 point gait pattern, heavy reliance on UE CGA/supervision  2/9: Pt. uses 2 point step pattern with walking with rollader with decreased stride length. Pt. would be limited due to pain in the knee walking for >100 ft.    Time 4    Period Weeks    Status Partially Met    Target Date 05/07/20      PT LONG TERM GOAL #4   Title Pt. able to stand from normal chair with no UE assist to improve safety/independence with transfers.     Baseline Unable to stand from standard chair without heavy UE assist. 10/6, pt unable to stand from standard chair without UE support.  11/10: benefits from 1 UE assist    2/9: Pt. can rise from normal chair with 1 UE assist and must control descent with UE assist.    Time 8    Period Weeks    Status Partially Met      PT LONG TERM GOAL #5   Title Pt. will perform a sit to stand with 1 UE support to be able to perform toileting activities with more independance.    Baseline Pt. requires 2 UE to push from to sit to stand from standard chair height. Pt. has grab rails available but can not get up by pulling.    Time 8    Period Weeks    Status Partially Met    Target Date 05/07/20      PT LONG TERM GOAL #6   Title Pt. will ascend/ descend 4 steps with use of B handrails and step pattern with mod. independence safely to be able to enter son's house.  Baseline Step pattern with heavy UE assist and CGA/min. A for safety.    Time 8    Period Weeks    Status Partially Met    Target Date 05/07/20            Plan - 03/18/20 1403    Clinical Impression Statement Due to R knee and hip pain, focus of treatment was on standing tolerance. Pt had increase in pain in R hip and knee with standing hip abduction and standing marching. Pt tolerated 4 bouts of standing with no UE (x2 fishing), and x2 playing tic-tac-toe with focus of dual tasking component and forward reach while standin. Pt tolerated bouts at 2 min 15 seconds then 2 min standing with seated rest break in between. Pt can continue to benefit from skilled PT treatment to reduce risk of falls and improve walking endurance with rollator to complete ADL's with improved independence.    Examination-Activity Limitations Toileting;Stand;Squat;Lift;Stairs    Stability/Clinical Decision Making Evolving/Moderate complexity    Rehab Potential Fair    PT Frequency 1x / week    PT Duration 8 weeks    PT Treatment/Interventions Moist Heat;Functional mobility training;Therapeutic activities;Therapeutic exercise;Balance training;Patient/family education;Manual techniques;Passive range of motion;Neuromuscular re-education;Stair training;Gait training;ADLs/Self Care Home Management    PT Next Visit Plan Decrease sitting time/ increase standing and walking tolerance.   Dynamic balance tasks.    PT Home Exercise Plan [email or web address]    Consulted and Agree with Plan of Care Patient;Family member/caregiver    Family Member Consulted spouse: [name]           Patient will benefit from skilled therapeutic intervention in order to improve the following deficits and impairments:  Abnormal gait, Decreased endurance, Decreased activity tolerance, Pain, Decreased balance, Impaired flexibility, Decreased strength, Postural dysfunction  Visit Diagnosis: Muscle weakness (generalized)  Gait difficulty  Balance  problem  Abnormal posture     Problem List Patient Active Problem List   Diagnosis Date Noted  . DVT femoral (deep venous thrombosis) with thrombophlebitis, left ([place]) 02/06/2020  . Lymphedema 04/15/2019  . Bilateral leg edema 01/25/2019  . Hyponatremia 12/24/2018  . SIADH (syndrome of inappropriate ADH production) ([place]) 12/24/2018  . Erosion of urethra due to catheterization of urinary tract ([place]) 10/27/2018  . Generalized weakness 10/14/2018  . Hip fracture ([place]) 09/15/2018  . 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 HLD (hyperlipidemia) 03/24/2018  . Contusion of right knee 02/14/2018  . Bradycardia 12/28/2017  . 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 Tremor 09/04/2016  . Chronic pain of right knee 08/10/2016  . Right ankle pain 08/10/2016  . 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  . 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[place]) 12/11/2015  . Urinary retention 09/08/2015  . Lymphoma, 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[place]) 04/03/2015  . Breathlessness on exertion 11/21/2014  . Breath shortness 11/21/2014  . Arthropathy 11/07/2014  . Atrial flutter, paroxysmal ([place]) 11/07/2014  . Type 2 diabetes mellitus ([place]) 11/07/2014  . Benign essential tremor 11/07/2014  . Benign essential HTN 11/07/2014  . Mixed incontinence 11/07/2014  . Hypercholesterolemia without hypertriglyceridemia 11/07/2014  . Apnea, sleep 11/07/2014  . Controlled type 2 diabetes mellitus without complication ([place]) 11/94/1740  . Pure hypercholesterolemia 11/07/2014  . Other abnormalities of gait and mobility 11/02/2011  . Decreased mobility 11/02/2011  . Abnormal gait 06/29/2011  . Discoordination 06/29/2011   Pura Spice, PT, DPT # [id number]aughters, SPT 03/19/2020, 1:06 PM  [place]  [facility] [address] [place], [place], [address] Phone: [phone]   Fax:   [phone]  Name: Mike Holloway MRN: [id number] Date of Birth: December 06, 1940

## 2020-03-20 ENCOUNTER — Encounter (INDEPENDENT_AMBULATORY_CARE_PROVIDER_SITE_OTHER): Payer: Self-pay | Admitting: Vascular Surgery

## 2020-03-20 ENCOUNTER — Other Ambulatory Visit: Payer: Self-pay

## 2020-03-20 ENCOUNTER — Ambulatory Visit (INDEPENDENT_AMBULATORY_CARE_PROVIDER_SITE_OTHER): Payer: Medicare Other | Admitting: Vascular Surgery

## 2020-03-20 VITALS — BP 189/100 | HR 83 | Resp 16 | Ht 68.0 in | Wt 215.0 lb

## 2020-03-20 DIAGNOSIS — E119 Type 2 diabetes mellitus without complications: Secondary | ICD-10-CM

## 2020-03-20 DIAGNOSIS — I82412 Acute embolism and thrombosis of left femoral vein: Secondary | ICD-10-CM | POA: Diagnosis not present

## 2020-03-20 DIAGNOSIS — I872 Venous insufficiency (chronic) (peripheral): Secondary | ICD-10-CM

## 2020-03-20 DIAGNOSIS — I10 Essential (primary) hypertension: Secondary | ICD-10-CM | POA: Diagnosis not present

## 2020-03-20 DIAGNOSIS — I4892 Unspecified atrial flutter: Secondary | ICD-10-CM | POA: Diagnosis not present

## 2020-03-20 DIAGNOSIS — Z794 Long term (current) use of insulin: Secondary | ICD-10-CM

## 2020-03-20 NOTE — Progress Notes (Signed)
MRN : [id number]  Mike Holloway is a 79 y.o. (Apr 01, 1941) male who presents with chief complaint of  Chief Complaint  Patient presents with  . Follow-up    no studies  .  History of Present Illness:   The patient returns to the office for followup evaluation regarding leg swelling.  The swelling has improved quite a bit and the pain associated with swelling has decreased substantially. There have not been any interval development of a ulcerations or wounds.  Since the previous visit the patient has been wearing graduated compression stockings and has noted little significant improvement in the lymphedema. The patient has been using compression routinely morning until night.  The patient also states elevation during the day and exercise is being done too.   Current Meds  Medication Sig  . acetaminophen (TYLENOL) 650 MG CR tablet Take 650 mg by mouth every 8 (eight) hours as needed for pain.  . Azelastine HCl 0.15 % SOLN U 1 SPRAY IEN BID  . cholecalciferol (VITAMIN D) 25 MCG (1000 UT) tablet Take 1,000 Units by mouth at bedtime.   . famotidine (PEPCID) 20 MG tablet Take 20 mg by mouth 2 (two) times daily.   . fexofenadine (ALLEGRA) 180 MG tablet TK 1 T PO ONCE D  . FLUoxetine (PROZAC) 10 MG capsule Take 10 mg by mouth daily.   [name] guaiFENesin-dextromethorphan (ROBITUSSIN DM) 100-10 MG/5ML syrup Take 5 mLs by mouth every 4 (four) hours as needed for cough.  . Lactobacillus (ACIDOPHILUS PO) Take by mouth.  . Melatonin 5 MG TABS Take 5 mg by mouth at bedtime. Takes 2 3 mg tablets  . primidone (MYSOLINE) 50 MG tablet Take 50-100 mg by mouth 2 (two) times daily. Take 2 tablets (100mg ) by mouth every morning and 1 tablet (50mg ) by mouth every night at bedtime  . propranolol ER (INDERAL LA) 160 MG SR capsule Take 160 mg by mouth daily.   . psyllium (HYDROCIL/METAMUCIL) 95 % PACK Take 1 packet by mouth daily.  . rivaroxaban (XARELTO) 20 MG TABS tablet Take 1 tablet (20 mg total) by  mouth daily with supper.  . tamsulosin (FLOMAX) 0.4 MG CAPS capsule Take by mouth.  [name] UNABLE TO FIND APPLY FROM NECK DOWN DAILY    Past Medical History:  Diagnosis Date  . Arthritis   . Atrial flutter ([place])   . Diabetes mellitus ([place])   . Essential tremor   . Essential tremor    deep brain stimulator   . Hypercholesteremia   . Hypertension   . Incontinence   . 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 Sleep apnea   . Stroke [facility])     Past Surgical History:  Procedure Laterality Date  . ABLATION    . APPENDECTOMY    . CATARACT EXTRACTION, BILATERAL    . COLONOSCOPY WITH PROPOFOL N/A 03/17/2018   Procedure: COLONOSCOPY WITH PROPOFOL;  Surgeon: Jonathon Bellows, MD;  Location: [facility] ENDOSCOPY;  Service: Gastroenterology;  Laterality: N/A;  . DEEP BRAIN STIMULATOR PLACEMENT    . 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 HEMORRHOID SURGERY    . HERNIA REPAIR    . HIP FRACTURE SURGERY    . INTRAMEDULLARY (IM) NAIL INTERTROCHANTERIC Right 09/16/2018   Procedure: INTRAMEDULLARY (IM) NAIL INTERTROCHANTRIC;  Surgeon: Hessie Knows, MD;  Location: [facility] ORS;  Service: Orthopedics;  Laterality: Right;  .  IR CATHETER TUBE CHANGE  11/22/2018  . NASAL SINUS SURGERY    . ORCHIECTOMY    . TONSILLECTOMY      Social History Social History   Tobacco Use  . Smoking status: Never Smoker  . Smokeless tobacco: Never Used  Vaping Use  . Vaping Use: Never used  Substance Use Topics  . Alcohol use: No    Alcohol/week: 0.0 standard drinks  . Drug use: No    Family History Family History  Problem Relation Age of Onset  . Hematuria Father   . Prostate cancer Father   . Diabetes Father   . Heart disease Mother   . Diabetes Mother   . Kidney disease Neg Hx   . Bladder Cancer Neg Hx     Allergies  Allergen Reactions  . Clindamycin Diarrhea    Contracted  C. Diff x 2  . Flagyl [Metronidazole] Swelling    Swollen tongue, excessive shaking,    . Ambien  [Zolpidem]     Other reaction(s): Other (See Comments) Disoriented and moody Other reaction(s): Other (See Comments) Disoriented and moody  . Penicillins Rash    Has patient had a PCN reaction causing immediate rash, facial/tongue/throat swelling, SOB or lightheadedness with hypotension: Unknown Has patient had a PCN reaction causing severe rash involving mucus membranes or skin necrosis: Unknown Has patient had a PCN reaction that required hospitalization: No Has patient had a PCN reaction occurring within the last 10 years: No If all of the above answers are "NO", then may proceed with Cephalosporin use.  . Sulfa Antibiotics Rash     REVIEW OF SYSTEMS (Negative unless checked)  Constitutional: [] Weight loss  [] Fever  [] Chills Cardiac: [] Chest pain   [] Chest pressure   [] Palpitations   [] Shortness of breath when laying flat   [] Shortness of breath with exertion. Vascular:  [] Pain in legs with walking   [] Pain in legs at rest  [] History of DVT   [] Phlebitis   [x] Swelling in legs   [] Varicose veins   [] Non-healing ulcers Pulmonary:   [] Uses home oxygen   [] Productive cough   [] Hemoptysis   [] Wheeze  [] COPD   [] Asthma Neurologic:  [] Dizziness   [] Seizures   [] History of stroke   [] History of TIA  [] Aphasia   [] Vissual changes   [] Weakness or numbness in arm   [] Weakness or numbness in leg Musculoskeletal:   [] Joint swelling   [] Joint pain   [] Low back pain Hematologic:  [] Easy bruising  [] Easy bleeding   [] Hypercoagulable state   [] Anemic Gastrointestinal:  [] Diarrhea   [] Vomiting  [x] Gastroesophageal reflux/heartburn   [] Difficulty swallowing. Genitourinary:  [] Chronic kidney disease   [] Difficult urination  [] Frequent urination   [] Blood in urine Skin:  [] Rashes   [] Ulcers  Psychological:  [] History of anxiety   []  History of major depression.  Physical Examination  Vitals:   03/20/20  1448  BP: (!) 189/100  Pulse: 83  Resp: 16  Weight: 215 lb (97.5 kg)  Height: 5\' 8"  (1.727 m)   Body mass index is 32.69 kg/m. Gen: WD/WN, NAD Head: [place]/AT, No temporalis wasting.  Ear/Nose/Throat: Hearing grossly intact, nares w/o erythema or drainage Eyes: PER, EOMI, sclera nonicteric.  Neck: Supple, no large masses.   Pulmonary:  Good air movement, no audible wheezing bilaterally, no use of accessory muscles.  Cardiac: RRR, no JVD Vascular: scattered varicosities present bilaterally.  Mild venous stasis changes to the legs bilaterally.  2+ soft pitting edema Vessel Right Left  Radial Palpable Palpable  Gastrointestinal:  Non-distended. No guarding/no peritoneal signs.  Musculoskeletal: M/S 5/5 throughout.  No deformity or atrophy.  Neurologic: CN 2-12 intact. Symmetrical.  Speech is fluent. Motor exam as listed above. Psychiatric: Judgment intact, Mood & affect appropriate for pt's clinical situation. Dermatologic: No rashes or ulcers noted.  No changes consistent with cellulitis. Lymph : No lichenification or skin changes of chronic lymphedema.  CBC Lab Results  Component Value Date   WBC 8.4 01/29/2020   HGB 14.4 01/29/2020   HCT 41.7 01/29/2020   MCV 95.0 01/29/2020   PLT 116 (L) 01/29/2020    BMET    Component Value Date/Time   NA 135 01/29/2020 0152   K 3.9 01/29/2020 0152   CL 107 01/29/2020 0152   CO2 23 01/29/2020 0152   GLUCOSE 134 (H) 01/29/2020 0152   BUN 24 (H) 01/29/2020 0152   CREATININE 1.19 01/29/2020 0152   CALCIUM 9.6 01/29/2020 0152   GFRNONAA 58 (L) 01/29/2020 0152   GFRAA >60 01/29/2020 0152   CrCl cannot be calculated (Patient's most recent lab result is older than the maximum 21 days allowed.).  COAG Lab Results  Component Value Date   INR 0.99 09/15/2018    Radiology No results found.   Assessment/Plan 1. DVT femoral (deep venous thrombosis) with thrombophlebitis, left (HCC) No surgery or intervention at this point in time.  I  have reviewed my discussion with the patient regarding venous insufficiency and why it causes symptoms. I have discussed with the patient the chronic skin changes that accompany venous insufficiency and the long term sequela such as ulceration. Patient will contnue wearing graduated compression stockings on a daily basis, as this has provided excellent control of his edema. The patient will put the stockings on first thing in the morning and removing them in the evening. The patient is reminded not to sleep in the stockings.  In addition, behavioral modification including elevation during the day will be initiated. Exercise is strongly encouraged.  Given the patient's good control and lack of any problems regarding the venous insufficiency and lymphedema a lymph pump in not need at this time.  The patient will follow up with me PRN should anything change.  The patient voices agreement with this plan.   2. Chronic venous insufficiency No surgery or intervention at this point in time.  I have reviewed my discussion with the patient regarding venous insufficiency and why it causes symptoms. I have discussed with the patient the chronic skin changes that accompany venous insufficiency and the long term sequela such as ulceration. Patient will contnue wearing graduated compression stockings on a daily basis, as this has provided excellent control of his edema. The patient will put the stockings on first thing in the morning and removing them in the evening. The patient is reminded not to sleep in the stockings.  In addition, behavioral modification including elevation during the day will be initiated. Exercise is strongly encouraged.  Given the patient's good control and lack of any problems regarding the venous insufficiency and lymphedema a lymph pump in not need at this time.  The patient will follow up with me PRN should anything change.  The patient voices agreement with this plan.   3. Atrial  flutter, paroxysmal (HCC) Continue antiarrhythmia medications as already ordered, these medications have been reviewed and there are no changes at this time.  Continue anticoagulation as ordered by Cardiology Service   4. Benign essential HTN Continue antihypertensive medications as already ordered, these medications have been reviewed and  there are no changes at this time.   5. Type 2 diabetes mellitus without complication, with long-term current use of insulin (HCC) Continue hypoglycemic medications as already ordered, these medications have been reviewed and there are no changes at this time.  Hgb A1C to be monitored as already arranged by primary service    Hortencia Pilar, MD  03/20/2020 2:59 PM

## 2020-03-25 ENCOUNTER — Other Ambulatory Visit: Payer: Self-pay

## 2020-03-25 ENCOUNTER — Encounter: Payer: Self-pay | Admitting: Physical Therapy

## 2020-03-25 ENCOUNTER — Other Ambulatory Visit: Payer: Self-pay | Admitting: Radiology

## 2020-03-25 ENCOUNTER — Ambulatory Visit: Payer: Medicare Other | Admitting: Physical Therapy

## 2020-03-25 DIAGNOSIS — M6281 Muscle weakness (generalized): Secondary | ICD-10-CM

## 2020-03-25 DIAGNOSIS — R339 Retention of urine, unspecified: Secondary | ICD-10-CM

## 2020-03-25 DIAGNOSIS — R293 Abnormal posture: Secondary | ICD-10-CM

## 2020-03-25 DIAGNOSIS — R269 Unspecified abnormalities of gait and mobility: Secondary | ICD-10-CM

## 2020-03-25 DIAGNOSIS — R2689 Other abnormalities of gait and mobility: Secondary | ICD-10-CM

## 2020-03-25 NOTE — Therapy (Signed)
[facility] [address] [place], [place], [address] Phone: [phone]   Fax:  [phone]  Physical Therapy Treatment  Patient Details  Name: Mike Holloway MRN: [id number] Date of Birth: [birth year] Referring Provider (PT): Dr. Rudene Christians   Encounter Date: 03/25/2020   PT End of Session - 03/25/20 1429    Visit Number 19    Number of Visits 72    Date for PT Re-Evaluation 05/07/20    Authorization - Visit Number 3    Authorization - Number of Visits 10    PT Start Time 7939    PT Stop Time 1415    PT Time Calculation (min) 70 min    Equipment Utilized During Treatment Gait belt    Activity Tolerance Patient tolerated treatment well;Patient limited by pain;Patient limited by fatigue    Behavior During Therapy [facility] for tasks assessed/performed           Past Medical History:  Diagnosis Date  . Arthritis   . Atrial flutter ([place])   . Diabetes mellitus ([place])   . Essential tremor   . Essential tremor    deep brain stimulator   . Hypercholesteremia   . Hypertension   . Incontinence   . 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 Sleep apnea   . Stroke [facility])     Past Surgical History:  Procedure Laterality Date  . ABLATION    . APPENDECTOMY    . CATARACT EXTRACTION, BILATERAL    . COLONOSCOPY WITH PROPOFOL N/A 03/17/2018   Procedure: COLONOSCOPY WITH PROPOFOL;  Surgeon: Jonathon Bellows, MD;  Location: [facility] ENDOSCOPY;  Service: Gastroenterology;  Laterality: N/A;  . DEEP BRAIN STIMULATOR PLACEMENT    . 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 HEMORRHOID SURGERY    . HERNIA REPAIR    . HIP FRACTURE SURGERY    . INTRAMEDULLARY (IM) NAIL INTERTROCHANTERIC Right 09/16/2018   Procedure: INTRAMEDULLARY (IM) NAIL INTERTROCHANTRIC;  Surgeon: Hessie Knows, MD;  Location: [facility] ORS;  Service:  Orthopedics;  Laterality: Right;  . IR CATHETER TUBE CHANGE  11/22/2018  . NASAL SINUS SURGERY    . ORCHIECTOMY    . TONSILLECTOMY      There were no vitals filed for this visit.   Subjective Assessment - 03/25/20 1428    Subjective Pt reports R knee pain prior to treatment rated at 4/10 NPS.    Patient is accompained by: Family member    Pertinent History Pt. states R knee is bothering him but it is the typical discomfort he experiences    Limitations House hold activities;Walking;Standing;Lifting    Patient Stated Goals Improve LE strength/ gait and balance with daily tasks.  Improve gait/ safety/ progression to least assistive device.    Currently in Pain? Yes    Pain Score 4     Pain Location Knee    Pain Orientation Right    Pain Onset Today    Multiple Pain Sites No    Pain Onset More than a month ago          There.ex:   // bars: Ambulating forwards/backwards x2, resisted walking backwards with black theraband, 2x5, standing marches with 3 lbs ankle weights 1x10, standing hip abd 1x10. Increased pain in R knee during stance on RLE so discontinued. Seated resisted hip ER in blue theraband. 1x15  Nu  step L5 for 10 min with hot pack on lumbar spine. Use of UE/LE  Standing tolerance: 2 bouts with seated rest breaks in between. Bout 1: 4 min 31 sec and Bout 2: 5 min 20 sec playing hangman as dual task working on dynamic forward reach using marker.   Reviewed HEP    PT Long Term Goals - 03/12/20 1442      PT LONG TERM GOAL #1   Title Pt. independent with HEP to increase B hip flexion/ R quad muscle strength 1/2 muscle grade to improve pain-free mobility.    Baseline R knee extension limited to 4/5 MMT secondary to pain/ fear of pain. 2/9: Deferred.  4/13: 4+/5 MMT (max potential).    Time 4    Period Weeks    Status Achieved    Target Date 01/08/20      PT LONG TERM GOAL #2   Title Pt. will increase Berg balance test to >40 out of 56 to improve independence with gait/  decrease fall risk.    Baseline Berg: 22/56 (significant fall risk); 9/30 32/56.  11/10: 32 (limited by R knee pain)  2/9: 33/56 limited due to R knee and Low Back pain    Time 8    Period Weeks    Status Not Met    Target Date 05/07/20      PT LONG TERM GOAL #3   Title Pt. able to ambulate 100 feet with consistent 2 point gait pattern and use of least restrictive assistive devce to improve household mobility.    Baseline Pt. able to ambulate with use of RW/ CGA to min. A for safety.   Heavy use of B UE.; 9/30 pt able to ambulate with rollator with inconsistent 2 point gait pattern, heavy reliance on UE CGA/supervision  2/9: Pt. uses 2 point step pattern with walking with rollader with decreased stride length. Pt. would be limited due to pain in the knee walking for >100 ft.    Time 4    Period Weeks    Status Partially Met    Target Date 05/07/20      PT LONG TERM GOAL #4   Title Pt. able to stand from normal chair with no UE assist to improve safety/independence with transfers.     Baseline Unable to stand from standard chair without heavy UE assist. 10/6, pt unable to stand from standard chair without UE support.  11/10: benefits from 1 UE assist    2/9: Pt. can rise from normal chair with 1 UE assist and must control descent with UE assist.    Time 8    Period Weeks    Status Partially Met      PT LONG TERM GOAL #5   Title Pt. will perform a sit to stand with 1 UE support to be able to perform toileting activities with more independance.    Baseline Pt. requires 2 UE to push from to sit to stand from standard chair height. Pt. has grab rails available but can not get up by pulling.    Time 8    Period Weeks    Status Partially Met    Target Date 05/07/20      PT LONG TERM GOAL #6   Title Pt. will ascend/ descend 4 steps with use of B handrails and step pattern with mod. independence safely to be able to enter son's house.    Baseline Step pattern with heavy UE assist and CGA/min.  A for safety.  Time 8    Period Weeks    Status Partially Met    Target Date 05/07/20                 Plan - 03/25/20 1430    Clinical Impression Statement Pt limited with standing resistance exercise due to R knee pain. Pt tolerated ability to improve standing endurance however heavily relying on BUE use on rollator. First bout of standing 4 min 31 sec with seated rest break then second bout 5 min 20 sec. Pt continues to require frequent seated rest breaks due to fatigue relying on BUE support on // bars and rollator. Pt can continue to benefit from skilled PT treatment to improve gait mechanics and reduce risk of falls.    Examination-Activity Limitations Toileting;Stand;Squat;Lift;Stairs    Stability/Clinical Decision Making Evolving/Moderate complexity    Clinical Decision Making Moderate    Rehab Potential Fair    PT Frequency 1x / week    PT Duration 8 weeks    PT Treatment/Interventions Moist Heat;Functional mobility training;Therapeutic activities;Therapeutic exercise;Balance training;Patient/family education;Manual techniques;Passive range of motion;Neuromuscular re-education;Stair training;Gait training;ADLs/Self Care Home Management    PT Next Visit Plan Decrease sitting time/ increase standing and walking tolerance.   Dynamic balance tasks.    PT Home Exercise Plan [email or web address]    Consulted and Agree with Plan of Care Patient;Family member/caregiver    Family Member Consulted spouse: [name]           Patient will benefit from skilled therapeutic intervention in order to improve the following deficits and impairments:  Abnormal gait, Decreased endurance, Decreased activity tolerance, Pain, Decreased balance, Impaired flexibility, Decreased strength, Postural dysfunction  Visit Diagnosis: Muscle weakness (generalized)  Gait difficulty  Balance problem  Abnormal posture     Problem List Patient Active Problem List   Diagnosis Date Noted  . DVT femoral (deep  venous thrombosis) with thrombophlebitis, left ([place]) 02/06/2020  . Lymphedema 04/15/2019  . Bilateral leg edema 01/25/2019  . Hyponatremia 12/24/2018  . SIADH (syndrome of inappropriate ADH production) ([place]) 12/24/2018  . Erosion of urethra due to catheterization of urinary tract ([place]) 10/27/2018  . Generalized weakness 10/14/2018  . Hip fracture ([place]) 09/15/2018  . 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 HLD (hyperlipidemia) 03/24/2018  . Contusion of right knee 02/14/2018  . Bradycardia 12/28/2017  . 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 Tremor 09/04/2016  . Chronic pain of right knee 08/10/2016  . Right ankle pain 08/10/2016  . 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  . 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[place]) 12/11/2015  . Urinary retention 09/08/2015  . Lymphoma, 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[place]) 04/03/2015  . Breathlessness on exertion 11/21/2014  . Breath shortness 11/21/2014  . Arthropathy 11/07/2014  . Atrial flutter, paroxysmal ([place]) 11/07/2014  . Type 2 diabetes mellitus ([place]) 11/07/2014  . Benign essential tremor 11/07/2014  . Benign essential HTN 11/07/2014  . Mixed incontinence 11/07/2014  . Hypercholesterolemia without hypertriglyceridemia 11/07/2014  . Apnea, sleep 11/07/2014  . Controlled type 2 diabetes mellitus without complication ([place]) 00/93/8182  . Pure hypercholesterolemia 11/07/2014  . Other abnormalities of gait and mobility 11/02/2011  . Decreased mobility 11/02/2011  . Abnormal gait 06/29/2011  . Discoordination 06/29/2011   Pura Spice, PT, DPT # [address], SPT 03/26/2020, 9:45 AM  [facility] [address] [place], [place], [address] Phone: [phone]   Fax:  [phone]  Name: Mike Holloway MRN:  [id number] Date of Birth: 1941-04-23

## 2020-03-26 ENCOUNTER — Telehealth: Payer: Self-pay

## 2020-03-26 NOTE — Telephone Encounter (Signed)
Called pt's wife per DPR, lengthly discussion had with pt and wife in regards to SPT care. All questions answered. They would like to proceed with having SPT placed and scheduled.

## 2020-03-26 NOTE — Telephone Encounter (Signed)
Discussed patient's available dates with wife. Will call back once appointment is confirmed.

## 2020-03-27 ENCOUNTER — Other Ambulatory Visit: Payer: Self-pay | Admitting: Radiology

## 2020-03-27 DIAGNOSIS — R339 Retention of urine, unspecified: Secondary | ICD-10-CM

## 2020-03-28 ENCOUNTER — Other Ambulatory Visit
Admission: RE | Admit: 2020-03-28 | Discharge: 2020-03-28 | Disposition: A | Discharge status: Home / Self Care | Payer: Medicare Other | Attending: Urology | Admitting: Urology

## 2020-03-28 DIAGNOSIS — R339 Retention of urine, unspecified: Secondary | ICD-10-CM | POA: Diagnosis present

## 2020-03-28 LAB — URINALYSIS, COMPLETE (UACMP) WITH MICROSCOPIC
Bacteria, UA: NONE SEEN
Bilirubin Urine: NEGATIVE
Glucose, UA: NEGATIVE mg/dL
Ketones, ur: NEGATIVE mg/dL
Nitrite: NEGATIVE
Protein, ur: NEGATIVE mg/dL
Specific Gravity, Urine: 1.015 (ref 1.005–1.030)
Squamous Epithelial / HPF: NONE SEEN (ref 0–5)
WBC, UA: >50 WBC/hpf (ref 0–5)
pH: 5.5 (ref 5.0–8.0)

## 2020-03-31 ENCOUNTER — Encounter (INDEPENDENT_AMBULATORY_CARE_PROVIDER_SITE_OTHER): Payer: Self-pay | Admitting: Vascular Surgery

## 2020-03-31 LAB — URINE CULTURE: Culture: 60000 — AB

## 2020-04-01 ENCOUNTER — Encounter: Payer: Self-pay | Admitting: Physical Therapy

## 2020-04-01 ENCOUNTER — Other Ambulatory Visit: Payer: Self-pay

## 2020-04-01 ENCOUNTER — Telehealth: Payer: Self-pay | Admitting: Radiology

## 2020-04-01 ENCOUNTER — Ambulatory Visit: Payer: Medicare Other | Attending: Orthopedic Surgery | Admitting: Physical Therapy

## 2020-04-01 DIAGNOSIS — R293 Abnormal posture: Secondary | ICD-10-CM | POA: Diagnosis present

## 2020-04-01 DIAGNOSIS — R269 Unspecified abnormalities of gait and mobility: Secondary | ICD-10-CM

## 2020-04-01 DIAGNOSIS — R2689 Other abnormalities of gait and mobility: Secondary | ICD-10-CM | POA: Diagnosis present

## 2020-04-01 DIAGNOSIS — M6281 Muscle weakness (generalized): Secondary | ICD-10-CM | POA: Diagnosis present

## 2020-04-01 DIAGNOSIS — R339 Retention of urine, unspecified: Secondary | ICD-10-CM

## 2020-04-01 DIAGNOSIS — N390 Urinary tract infection, site not specified: Secondary | ICD-10-CM

## 2020-04-01 MED ORDER — CIPROFLOXACIN HCL 250 MG PO TABS: 250.0000 mg | ORAL_TABLET | Freq: Two times a day (BID) | ORAL | 0 refills | Status: DC

## 2020-04-01 NOTE — Therapy (Signed)
[place] [facility] [address] [place], [place], [address] Phone: [phone]   Fax:  [phone]  Physical Therapy Treatment  Patient Details  Name: Mike Holloway MRN: [id number] Date of Birth: 11/02/1940 Referring Provider (PT): Dr. Rudene Christians   Encounter Date: 04/01/2020   PT End of Session - 04/01/20 1459    Visit Number 30    Number of Visits 72    Date for PT Re-Evaluation 05/07/20    Authorization - Visit Number 4    Authorization - Number of Visits 10    PT Start Time 1300    PT Stop Time 4235    PT Time Calculation (min) 55 min    Equipment Utilized During Treatment Gait belt    Activity Tolerance Patient tolerated treatment well;Patient limited by pain;Patient limited by fatigue    Behavior During Therapy [facility] for tasks assessed/performed           Past Medical History:  Diagnosis Date  . Arthritis   . Atrial flutter ([place])   . Diabetes mellitus ([place])   . Essential tremor   . Essential tremor    deep brain stimulator   . Hypercholesteremia   . Hypertension   . Incontinence   . 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 Sleep apnea   . Stroke [facility])     Past Surgical History:  Procedure Laterality Date  . ABLATION    . APPENDECTOMY    . CATARACT EXTRACTION, BILATERAL    . COLONOSCOPY WITH PROPOFOL N/A 03/17/2018   Procedure: COLONOSCOPY WITH PROPOFOL;  Surgeon: Jonathon Bellows, MD;  Location: [facility] ENDOSCOPY;  Service: Gastroenterology;  Laterality: N/A;  . DEEP BRAIN STIMULATOR PLACEMENT    . 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 HEMORRHOID SURGERY    . HERNIA REPAIR    . HIP FRACTURE SURGERY    . INTRAMEDULLARY (IM) NAIL INTERTROCHANTERIC Right 09/16/2018   Procedure: INTRAMEDULLARY (IM) NAIL INTERTROCHANTRIC;  Surgeon: Hessie Knows, MD;  Location: [facility] ORS;  Service:  Orthopedics;  Laterality: Right;  . IR CATHETER TUBE CHANGE  11/22/2018  . NASAL SINUS SURGERY    . ORCHIECTOMY    . TONSILLECTOMY      There were no vitals filed for this visit.   Subjective Assessment - 04/01/20 1457    Subjective Pt's spouse reports pt is getting a suprapubic catheter placed on Thursday and sees his neurologist on Wednesday. Pt's spouse also reports pt is able to walk 2 laps around [place] at home. Pt reports R hip and knee pain at 2/10 NPS    Patient is accompained by: Family member    Pertinent History Pt. states R knee is bothering him but it is the typical discomfort he experiences    Limitations House hold activities;Walking;Standing;Lifting    Patient Stated Goals Improve LE strength/ gait and balance with daily tasks.  Improve gait/ safety/ progression to least assistive device.    Currently in Pain? Yes    Pain Score 2     Pain Location Knee    Pain Orientation Right    Pain Type Chronic pain    Pain Onset Today    Pain Location Hip    Pain Type Chronic pain    Pain Onset More than a month ago           There.ex:  Nu-Step L5 for 10 min and heating pad on lumbar spine. Use of UE's/LE's  Ambulating in hallway to improve endurance with Gait: 102' with rollator in 4 min before requiring seated rest break. BUE mod/max assist on rollator. Equal step length but heavily relies on BUE's for support and displays narrow BOS.  TUG:  Trial one: 51.41 seconds  Trial two: 47.33 seconds  Avg: 49.37 seconds  BUE use for Standing from chair. BUE mod/max assist on rollator for support. Poor eccentric control with sitting requiring BUE on chair to sit.   Standing tolerance: Playing hangman as cognitive dual task and requiring pt single UE support on rollator while RUE reaches anteriorly outside BOS to write on board. x2 Trial one: 4 min 12 sec Trial two: 3 min 29 sec       PT Long Term Goals - 03/12/20 1442      PT LONG TERM GOAL #1   Title Pt. independent  with HEP to increase B hip flexion/ R quad muscle strength 1/2 muscle grade to improve pain-free mobility.    Baseline R knee extension limited to 4/5 MMT secondary to pain/ fear of pain. 2/9: Deferred.  4/13: 4+/5 MMT (max potential).    Time 4    Period Weeks    Status Achieved    Target Date 01/08/20      PT LONG TERM GOAL #2   Title Pt. will increase Berg balance test to >40 out of 56 to improve independence with gait/ decrease fall risk.    Baseline Berg: 22/56 (significant fall risk); 9/30 32/56.  11/10: 32 (limited by R knee pain)  2/9: 33/56 limited due to R knee and Low Back pain    Time 8    Period Weeks    Status Not Met    Target Date 05/07/20      PT LONG TERM GOAL #3   Title Pt. able to ambulate 100 feet with consistent 2 point gait pattern and use of least restrictive assistive devce to improve household mobility.    Baseline Pt. able to ambulate with use of RW/ CGA to min. A for safety.   Heavy use of B UE.; 9/30 pt able to ambulate with rollator with inconsistent 2 point gait pattern, heavy reliance on UE CGA/supervision  2/9: Pt. uses 2 point step pattern with walking with rollader with decreased stride length. Pt. would be limited due to pain in the knee walking for >100 ft.    Time 4    Period Weeks    Status Partially Met    Target Date 05/07/20      PT LONG TERM GOAL #4   Title Pt. able to stand from normal chair with no UE assist to improve safety/independence with transfers.     Baseline Unable to stand from standard chair without heavy UE assist. 10/6, pt unable to stand from standard chair without UE support.  11/10: benefits from 1 UE assist    2/9: Pt. can rise from normal chair with 1 UE assist and must control descent with UE assist.    Time 8    Period Weeks    Status Partially Met      PT LONG TERM GOAL #5   Title Pt. will perform a sit to stand with 1 UE support to be able to perform toileting activities with more independance.    Baseline Pt. requires  2 UE to push from to sit to stand from standard chair height. Pt. has grab  rails available but can not get up by pulling.    Time 8    Period Weeks    Status Partially Met    Target Date 05/07/20      PT LONG TERM GOAL #6   Title Pt. will ascend/ descend 4 steps with use of B handrails and step pattern with mod. independence safely to be able to enter son's house.    Baseline Step pattern with heavy UE assist and CGA/min. A for safety.    Time 8    Period Weeks    Status Partially Met    Target Date 05/07/20                 Plan - 04/01/20 1459    Clinical Impression Statement Session focused on improving gait/standing tolerance. Pt ambulated 102' with rollator and mod/max BUE support before needing rest break. Pt demonstrated decreased gait speed, narrow BOS, but dislpayed equal step length. Pt also performed the TUG twice. The first TUG scored at 51.41 sec and the second TUG at 47.33 sec displaying significant limitations in gait. However pt is improving in his standing tolerance being able to stand for 4 min and 12 sec while playing Hangman on whiteboard to reduce BUE support on rollator, reaching anteriorly outside BOS, and incorpating dual task. Pt can continue to benefit from skilled PT treatment to improve standing and walking tolerance.    Examination-Activity Limitations Toileting;Stand;Squat;Lift;Stairs    Stability/Clinical Decision Making Evolving/Moderate complexity    Rehab Potential Fair    PT Frequency 1x / week    PT Duration 8 weeks    PT Treatment/Interventions Moist Heat;Functional mobility training;Therapeutic activities;Therapeutic exercise;Balance training;Patient/family education;Manual techniques;Passive range of motion;Neuromuscular re-education;Stair training;Gait training;ADLs/Self Care Home Management    PT Next Visit Plan Decrease sitting time/ increase standing and walking tolerance.   Dynamic balance tasks.    PT Home Exercise Plan [id number]     Consulted and Agree with Plan of Care Patient;Family member/caregiver    Family Member Consulted spouse: [name]           Patient will benefit from skilled therapeutic intervention in order to improve the following deficits and impairments:  Abnormal gait, Decreased endurance, Decreased activity tolerance, Pain, Decreased balance, Impaired flexibility, Decreased strength, Postural dysfunction  Visit Diagnosis: Muscle weakness (generalized)  Gait difficulty  Balance problem  Abnormal posture     Problem List Patient Active Problem List   Diagnosis Date Noted  . DVT femoral (deep venous thrombosis) with thrombophlebitis, left ([place]) 02/06/2020  . Lymphedema 04/15/2019  . Bilateral leg edema 01/25/2019  . Hyponatremia 12/24/2018  . SIADH (syndrome of inappropriate ADH production) ([place]) 12/24/2018  . Erosion of urethra due to catheterization of urinary tract ([place]) 10/27/2018  . Generalized weakness 10/14/2018  . Hip fracture ([place]) 09/15/2018  . 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 HLD (hyperlipidemia) 03/24/2018  . Contusion of right knee 02/14/2018  . Bradycardia 12/28/2017  . 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 Tremor 09/04/2016  . Chronic pain of right knee 08/10/2016  . Right ankle pain 08/10/2016  . 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  . 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[place]) 12/11/2015  . Urinary retention 09/08/2015  . Lymphoma, 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[place]) 04/03/2015  . Breathlessness on exertion 11/21/2014  . Breath shortness 11/21/2014  . Arthropathy 11/07/2014  . Atrial flutter, paroxysmal ([place]) 11/07/2014  . Type 2 diabetes mellitus ([place]) 11/07/2014  . Benign essential tremor 11/07/2014  .  Benign essential HTN 11/07/2014  . Mixed incontinence 11/07/2014  . Hypercholesterolemia without hypertriglyceridemia 11/07/2014  . Apnea, sleep 11/07/2014  .  Controlled type 2 diabetes mellitus without complication ([place]) 21/79/8102  . Pure hypercholesterolemia 11/07/2014  . Other abnormalities of gait and mobility 11/02/2011  . Decreased mobility 11/02/2011  . Abnormal gait 06/29/2011  . Discoordination 06/29/2011   Pura Spice, PT, DPT # [address], SPT 04/02/2020, 9:18 AM  [place] [facility] [place] [place] [address] [place], [place], [address] Phone: [phone]   Fax:  [phone]  Name: Mike Holloway MRN: [id number] Date of Birth: November 07, 1940

## 2020-04-01 NOTE — Progress Notes (Signed)
Patient on schedule for SPT placement. Spoke with wife on phone with pre procedure instructions given, made aware to be here @ 0830, NPO after MN as well as driver post procedure for discharge. Stated understanding.

## 2020-04-01 NOTE — Telephone Encounter (Signed)
-----   Message from Abbie Sons, MD sent at 04/01/2020  9:10 AM EDT ----- Urine culture was positive.  Was this ordered prior to [facility] tube placement?  If asymptomatic would start Cipro 250 mg twice daily x3 days

## 2020-04-01 NOTE — Telephone Encounter (Signed)
Wife reports patient's urine has a strong odor and he has frequency. Denies hematuria. Notified wife of script sent to pharmacy.

## 2020-04-02 ENCOUNTER — Other Ambulatory Visit: Payer: Self-pay | Admitting: Student

## 2020-04-03 ENCOUNTER — Ambulatory Visit
Admission: RE | Admit: 2020-04-03 | Discharge: 2020-04-03 | Disposition: A | Discharge status: Home / Self Care | Payer: Medicare Other | Source: Ambulatory Visit | Attending: Urology | Admitting: Urology

## 2020-04-03 ENCOUNTER — Other Ambulatory Visit: Payer: Self-pay

## 2020-04-03 ENCOUNTER — Ambulatory Visit: Admission: RE | Admit: 2020-04-03 | Payer: Medicare Other | Source: Ambulatory Visit

## 2020-04-03 ENCOUNTER — Other Ambulatory Visit: Payer: Self-pay | Admitting: Urology

## 2020-04-03 DIAGNOSIS — Z8572 Personal history of non-Hodgkin lymphomas: Secondary | ICD-10-CM | POA: Diagnosis not present

## 2020-04-03 DIAGNOSIS — R339 Retention of urine, unspecified: Secondary | ICD-10-CM

## 2020-04-03 DIAGNOSIS — I10 Essential (primary) hypertension: Secondary | ICD-10-CM | POA: Diagnosis not present

## 2020-04-03 DIAGNOSIS — Z8673 Personal history of transient ischemic attack (TIA), and cerebral infarction without residual deficits: Secondary | ICD-10-CM | POA: Diagnosis not present

## 2020-04-03 DIAGNOSIS — Z881 Allergy status to other antibiotic agents status: Secondary | ICD-10-CM | POA: Diagnosis not present

## 2020-04-03 DIAGNOSIS — E119 Type 2 diabetes mellitus without complications: Secondary | ICD-10-CM | POA: Diagnosis not present

## 2020-04-03 DIAGNOSIS — Z882 Allergy status to sulfonamides status: Secondary | ICD-10-CM | POA: Diagnosis not present

## 2020-04-03 DIAGNOSIS — Z79899 Other long term (current) drug therapy: Secondary | ICD-10-CM | POA: Insufficient documentation

## 2020-04-03 DIAGNOSIS — Z880 Allergy status to penicillin: Secondary | ICD-10-CM | POA: Diagnosis not present

## 2020-04-03 DIAGNOSIS — G4730 Sleep apnea, unspecified: Secondary | ICD-10-CM | POA: Diagnosis not present

## 2020-04-03 HISTORY — DX: Syndrome of inappropriate secretion of antidiuretic hormone: E22.2

## 2020-04-03 LAB — PROTIME-INR
INR: 1 (ref 0.8–1.2)
Prothrombin Time: 13 seconds (ref 11.4–15.2)

## 2020-04-03 LAB — CBC
HCT: 36.8 % — ABNORMAL LOW (ref 39.0–52.0)
Hemoglobin: 12.4 g/dL — ABNORMAL LOW (ref 13.0–17.0)
MCH: 32.6 pg (ref 26.0–34.0)
MCHC: 33.7 g/dL (ref 30.0–36.0)
MCV: 96.8 fL (ref 80.0–100.0)
Platelets: 113 10*3/uL — ABNORMAL LOW (ref 150–400)
RBC: 3.8 MIL/uL — ABNORMAL LOW (ref 4.22–5.81)
RDW: 14.6 % (ref 11.5–15.5)
WBC: 5.6 10*3/uL (ref 4.0–10.5)
nRBC: 0 % (ref 0.0–0.2)

## 2020-04-03 MED ORDER — FENTANYL CITRATE (PF) 100 MCG/2ML IJ SOLN
INTRAMUSCULAR | Status: AC
  Filled 2020-04-03: qty 2

## 2020-04-03 MED ORDER — VANCOMYCIN HCL IN DEXTROSE 1-5 GM/200ML-% IV SOLN
1000.0000 mg | Freq: Once | INTRAVENOUS | Status: DC
  Filled 2020-04-03: qty 200

## 2020-04-03 MED ORDER — SODIUM CHLORIDE 0.9 % IV SOLN: INTRAVENOUS | Status: DC

## 2020-04-03 MED ORDER — VANCOMYCIN HCL 500 MG/100ML IV SOLN
INTRAVENOUS | Status: DC | PRN
  Administered 2020-04-03 (×2): 1000 mg via INTRAVENOUS

## 2020-04-03 MED ORDER — MIDAZOLAM HCL 2 MG/2ML IJ SOLN
INTRAMUSCULAR | Status: AC
  Filled 2020-04-03: qty 2

## 2020-04-03 NOTE — Procedures (Signed)
Interventional Radiology   Note  Procedure: unable to place CT SP tube because of overlying small bowel and sigmoid despite putting in foley and trying to distend the bladder  Complications: None  Estimated Blood Loss: none  Findings: As above  D/w pt and wife  Dr Bernardo Heater away this week.

## 2020-04-03 NOTE — Progress Notes (Signed)
Patient ID: Mike Holloway, male   DOB: 1941/06/25, 79 y.o.   MRN: [id number]      Chief Complaint:  Urinary retention  Referring Physician(s): Stoioff,Scott C    History of Present Illness: Mike Holloway is a 79 y.o. male with multiple co morbidities and recurrent urinary retention.  Here for replacement of the suprapubic cath.  No complaints today. ROS neg.   Past Medical History:  Diagnosis Date  . Arthritis   . Atrial flutter ([place])   . Diabetes mellitus ([place])   . Essential tremor   . Essential tremor    deep brain stimulator   . Hypercholesteremia   . Hypertension   . Incontinence   . 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 SIADH (syndrome of inappropriate ADH production) ([place])   . Sleep apnea   . Stroke [facility])     Past Surgical History:  Procedure Laterality Date  . ABLATION    . APPENDECTOMY    . CATARACT EXTRACTION, BILATERAL    . COLONOSCOPY WITH PROPOFOL N/A 03/17/2018   Procedure: COLONOSCOPY WITH PROPOFOL;  Surgeon: Jonathon Bellows, MD;  Location: [facility] ENDOSCOPY;  Service: Gastroenterology;  Laterality: N/A;  . DEEP BRAIN STIMULATOR PLACEMENT    . 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 HEMORRHOID SURGERY    . HERNIA REPAIR    . HIP FRACTURE SURGERY    . INTRAMEDULLARY (IM) NAIL INTERTROCHANTERIC Right 09/16/2018   Procedure: INTRAMEDULLARY (IM) NAIL INTERTROCHANTRIC;  Surgeon: Hessie Knows, MD;  Location: [facility] ORS;  Service: Orthopedics;  Laterality: Right;  . IR CATHETER TUBE CHANGE  11/22/2018  . NASAL SINUS SURGERY    . ORCHIECTOMY    . TONSILLECTOMY      Allergies: Clindamycin, Flagyl [metronidazole], Ambien  [zolpidem], Penicillins, and Sulfa antibiotics  Medications: Prior to Admission medications   Medication Sig Start Date End Date Taking? Authorizing Provider  acetaminophen (TYLENOL) 650 MG CR  tablet Take 650 mg by mouth every 8 (eight) hours as needed for pain.   Yes [provider]  Azelastine HCl 0.15 % SOLN U 1 SPRAY IEN BID 03/15/19  Yes [provider]  cholecalciferol (VITAMIN D) 25 MCG (1000 UT) tablet Take 1,000 Units by mouth at bedtime.    Yes [provider]  ciprofloxacin (CIPRO) 250 MG tablet Take 1 tablet (250 mg total) by mouth every 12 (twelve) hours. 04/01/20  Yes Stoioff, Ronda Fairly, MD  famotidine (PEPCID) 20 MG tablet Take 20 mg by mouth 2 (two) times daily.  05/20/18  Yes [provider]  fexofenadine (ALLEGRA) 180 MG tablet TK 1 T PO ONCE D 03/15/19  Yes [provider]  FLUoxetine (PROZAC) 10 MG capsule Take 10 mg by mouth daily.  09/09/18  Yes [provider]  Lactobacillus (ACIDOPHILUS PO) Take by mouth daily.    Yes [provider]  Melatonin 5 MG TABS Take 5 mg by mouth at bedtime. Takes 2 3 mg tablets   Yes [provider]  primidone (MYSOLINE) 50 MG tablet Take 50-100 mg by mouth 2 (two) times daily. Take 2 tablets (100mg ) by mouth every morning and 1 tablet (50mg ) by mouth every night at bedtime   Yes [provider]  propranolol ER (INDERAL LA) 160 MG SR capsule Take 160 mg by mouth daily.    Yes [provider]  psyllium (HYDROCIL/METAMUCIL) 95 %  PACK Take 1 packet by mouth daily.   Yes [provider]  tamsulosin (FLOMAX) 0.4 MG CAPS capsule Take by mouth daily.    Yes [provider]  guaiFENesin-dextromethorphan (ROBITUSSIN DM) 100-10 MG/5ML syrup Take 5 mLs by mouth every 4 (four) hours as needed for cough.    [provider]  mirabegron ER (MYRBETRIQ) 50 MG TB24 tablet Take 1 tablet (50 mg total) by mouth daily. Patient not taking: Reported on 02/07/2020 10/10/19   Abbie Sons, MD  Probiotic Product (VSL#3) CAPS Take 1 capsule by mouth daily.  Patient not taking: Reported on 03/20/2020    [provider]  rivaroxaban (XARELTO) 20 MG  TABS tablet Take 1 tablet (20 mg total) by mouth daily with supper. Patient not taking: Reported on 04/03/2020 02/07/20   Schnier, Dolores Lory, MD  RIVAROXABAN [employer]) VTE STARTER PACK (15 & 20 MG TABLETS) Follow package directions: Take one 15mg  tablet by mouth twice a day. On day 22, switch to one 20mg  tablet once a day. Take with food. Patient not taking: Reported on 03/20/2020 01/29/20   Duffy Bruce, MD  UNABLE TO FIND APPLY FROM NECK DOWN DAILY 12/23/18   [provider]     Family History  Problem Relation Age of Onset  . Hematuria Father   . Prostate cancer Father   . Diabetes Father   . Heart disease Mother   . Diabetes Mother   . Kidney disease Neg Hx   . Bladder Cancer Neg Hx     Social History   Socioeconomic History  . Marital status: Married    Spouse name: Not on file  . Number of children: Not on file  . Years of education: Not on file  . Highest education level: Not on file  Occupational History  . Not on file  Tobacco Use  . Smoking status: Never Smoker  . Smokeless tobacco: Never Used  Vaping Use  . Vaping Use: Never used  Substance and Sexual Activity  . Alcohol use: No    Alcohol/week: 0.0 standard drinks  . Drug use: No  . Sexual activity: Yes  Other Topics Concern  . Not on file  Social History Narrative  . Not on file   Social Determinants of Health   Financial Resource Strain:   . Difficulty of Paying Living Expenses:   Food Insecurity:   . Worried About Charity fundraiser in the Last Year:   . Arboriculturist in the Last Year:   Transportation Needs:   . Film/video editor (Medical):   [name] Lack of Transportation (Non-Medical):   Physical Activity:   . Days of Exercise per Week:   . Minutes of Exercise per Session:   Stress:   . Feeling of Stress :   Social Connections:   . Frequency of Communication with Friends and Family:   . Frequency of Social Gatherings with Friends and Family:   . Attends Religious Services:   .  Active Member of Clubs or Organizations:   . Attends Archivist Meetings:   [name] Marital Status:       Review of Systems: A 12 point ROS discussed and pertinent positives are indicated in the HPI above.  All other systems are negative.  Review of Systems  Vital Signs: BP (!) 156/76   Pulse 71   Temp 98.5 F (36.9 C) (Oral)   Resp 20   Ht 5\' 9"  (1.753 m)   Wt 98.4 kg  SpO2 96%   BMI 32.05 kg/m   Physical Exam Constitutional:      General: He is not in acute distress.    Appearance: He is not toxic-appearing.  Eyes:     General: No scleral icterus.    Pupils: Pupils are equal, round, and reactive to light.  Cardiovascular:     Rate and Rhythm: Normal rate and regular rhythm.     Heart sounds: No murmur heard.   Pulmonary:     Effort: Pulmonary effort is normal.     Breath sounds: Normal breath sounds.  Abdominal:     General: Bowel sounds are normal. There is no distension.  Neurological:     Mental Status: Mental status is at baseline.  Psychiatric:        Mood and Affect: Mood normal.     Imaging: No results found.  Labs:  CBC: Recent Labs    01/29/20 0152 04/03/20 0906  WBC 8.4 5.6  HGB 14.4 12.4*  HCT 41.7 36.8*  PLT 116* 113*    COAGS: Recent Labs    04/03/20 0906  INR 1.0    BMP: Recent Labs    01/29/20 0152  NA 135  K 3.9  CL 107  CO2 23  GLUCOSE 134*  BUN 24*  CALCIUM 9.6  CREATININE 1.19  GFRNONAA 58*  GFRAA >60    LIVER FUNCTION TESTS: No results for input(s): BILITOT, AST, ALT, ALKPHOS, PROT, ALBUMIN in the last 8760 hours.   Assessment and Plan:  Agree with CT guided SP tube placement today. Consent obtained from pt and hie wife.  Risks and benefits discussed with the patient including bleeding, infection, damage to adjacent structures,  and sepsis.  All of the patient's questions were answered, patient is agreeable to proceed. Consent signed and in chart.    Thank you for this interesting consult.  I  greatly enjoyed meeting Jaxxon Naeem and look forward to participating in their care.  A copy of this report was sent to the requesting provider on this date.  Electronically Signed: Greggory Keen, MD 04/03/2020, 9:59 AM   I spent a total of  30 Minutes   in face to face in clinical consultation, greater than 50% of which was counseling/coordinating care for this pt with urinary retention.

## 2020-04-03 NOTE — Progress Notes (Signed)
Patient in recovery post attempted SPT placement per DR [facility]. 31fr foley catheter placed using sterile protocol per orders per Dr Annamaria Boots with sterile water instilled to bladder, however with more fluid instilled, had spasms with urine around foley,vitals remained stable, patient brought back to specials with wife present at bedside. Message left with Dr Chrystine Oiler who is on call for urology today, for Dr Annamaria Boots to discuss plan. Report given to Carlynn Spry Rn in specials with questions answered.

## 2020-04-03 NOTE — OR Nursing (Signed)
Foley removed (it was placed in CT for procedure fopr bladder dilation by Forestine Chute)

## 2020-04-07 ENCOUNTER — Telehealth: Payer: Self-pay | Admitting: *Deleted

## 2020-04-07 NOTE — Telephone Encounter (Signed)
Would recommend PA visit for bladder scan tomorrow.

## 2020-04-07 NOTE — Telephone Encounter (Signed)
Patient's wife concerned with decreased urine output since 04/04/20. Failed placement of SPT on 04/03/20. Denies pain or any other symptoms. Please advise recommendations.

## 2020-04-07 NOTE — Telephone Encounter (Signed)
Patient's wife notified-appointment scheduled-will be coming from PT.

## 2020-04-08 ENCOUNTER — Encounter: Payer: Self-pay | Admitting: Physical Therapy

## 2020-04-08 ENCOUNTER — Ambulatory Visit (INDEPENDENT_AMBULATORY_CARE_PROVIDER_SITE_OTHER): Payer: Medicare Other | Admitting: Physician Assistant

## 2020-04-08 ENCOUNTER — Other Ambulatory Visit: Payer: Self-pay

## 2020-04-08 ENCOUNTER — Ambulatory Visit: Payer: Medicare Other | Admitting: Physical Therapy

## 2020-04-08 ENCOUNTER — Encounter: Payer: Self-pay | Admitting: Physician Assistant

## 2020-04-08 VITALS — BP 116/74 | HR 73 | Ht 71.0 in | Wt 220.0 lb

## 2020-04-08 DIAGNOSIS — R293 Abnormal posture: Secondary | ICD-10-CM

## 2020-04-08 DIAGNOSIS — R2689 Other abnormalities of gait and mobility: Secondary | ICD-10-CM

## 2020-04-08 DIAGNOSIS — R269 Unspecified abnormalities of gait and mobility: Secondary | ICD-10-CM

## 2020-04-08 DIAGNOSIS — M6281 Muscle weakness (generalized): Secondary | ICD-10-CM | POA: Diagnosis not present

## 2020-04-08 DIAGNOSIS — R339 Retention of urine, unspecified: Secondary | ICD-10-CM | POA: Diagnosis not present

## 2020-04-08 LAB — BLADDER SCAN AMB NON-IMAGING

## 2020-04-08 NOTE — Therapy (Signed)
[facility] [address] [place], [place], [address] Phone: [phone]   Fax:  [phone]  Physical Therapy Treatment  Patient Details  Name: Mike Holloway MRN: [id number] Date of Birth: 1941/05/06 Referring Provider (PT): Dr. Rudene Christians   Encounter Date: 04/08/2020   PT End of Session - 04/08/20 1538    Visit Number 38    Number of Visits 72    Date for PT Re-Evaluation 05/07/20    Authorization - Visit Number 5    Authorization - Number of Visits 10    PT Start Time 1300    PT Stop Time 1400    PT Time Calculation (min) 60 min    Equipment Utilized During Treatment Gait belt    Activity Tolerance Patient tolerated treatment well;Patient limited by pain;Patient limited by fatigue    Behavior During Therapy [facility] for tasks assessed/performed           Past Medical History:  Diagnosis Date  . Arthritis   . Atrial flutter ([place])   . Diabetes mellitus ([place])   . Essential tremor   . Essential tremor    deep brain stimulator   . Hypercholesteremia   . Hypertension   . Incontinence   . 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[place])    grew on the testical  . SIADH (syndrome of inappropriate ADH production) ([place])   . Sleep apnea   . Stroke [facility])     Past Surgical History:  Procedure Laterality Date  . ABLATION    . APPENDECTOMY    . CATARACT EXTRACTION, BILATERAL    . COLONOSCOPY WITH PROPOFOL N/A 03/17/2018   Procedure: COLONOSCOPY WITH PROPOFOL;  Surgeon: Jonathon Bellows, MD;  Location: [facility] ENDOSCOPY;  Service: Gastroenterology;  Laterality: N/A;  . DEEP BRAIN STIMULATOR PLACEMENT    . 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 HEMORRHOID SURGERY    . HERNIA REPAIR    . HIP FRACTURE SURGERY    . INTRAMEDULLARY (IM) NAIL INTERTROCHANTERIC Right 09/16/2018   Procedure: INTRAMEDULLARY (IM) NAIL INTERTROCHANTRIC;   Surgeon: Hessie Knows, MD;  Location: [facility] ORS;  Service: Orthopedics;  Laterality: Right;  . IR CATHETER TUBE CHANGE  11/22/2018  . NASAL SINUS SURGERY    . ORCHIECTOMY    . TONSILLECTOMY      There were no vitals filed for this visit.   Subjective Assessment - 04/08/20 1536    Subjective Pt and spouse report that suprapubic catheterization was not successful. Pt reports R knee pain 2/10 NPS and that he has been walking more at home indoors. Pt has doctor's appointment at 3 pm today after PT appointment.    Patient is accompained by: Family member    Pertinent History Pt. states R knee is bothering him but it is the typical discomfort he experiences    Limitations House hold activities;Walking;Standing;Lifting    Patient Stated Goals Improve LE strength/ gait and balance with daily tasks.  Improve gait/ safety/ progression to least assistive device.    Currently in Pain? Yes    Pain Location Knee    Pain Orientation Right    Pain Descriptors / Indicators Aching;Discomfort    Pain Type Chronic pain    Pain Onset More than a month ago    Pain Frequency Intermittent    Multiple Pain Sites No    Pain Onset More than a month ago  There.ex:   Nu-Step: L5 for 10 min, LE's only  STS with use of BUE hand rails on chair: 2x10 Resisted walking at // bars: Forwards/backwards x5/direction  Neuro Re-Ed:   Standing ball toss on unstable surface (blue mat) with no BUE support on rollator: 2 min, 41 seconds  Standing tolerance on blue mat (fishing): x2, 2 min 5 seconds each. Working on reaching outside [employer] course: Weaving around cones/step ladder on equal step lengths. x1    PT Long Term Goals - 03/12/20 1442      PT LONG TERM GOAL #1   Title Pt. independent with HEP to increase B hip flexion/ R quad muscle strength 1/2 muscle grade to improve pain-free mobility.    Baseline R knee extension limited to 4/5 MMT secondary to pain/ fear of pain. 2/9: Deferred.  4/13: 4+/5  MMT (max potential).    Time 4    Period Weeks    Status Achieved    Target Date 01/08/20      PT LONG TERM GOAL #2   Title Pt. will increase Berg balance test to >40 out of 56 to improve independence with gait/ decrease fall risk.    Baseline Berg: 22/56 (significant fall risk); 9/30 32/56.  11/10: 32 (limited by R knee pain)  2/9: 33/56 limited due to R knee and Low Back pain    Time 8    Period Weeks    Status Not Met    Target Date 05/07/20      PT LONG TERM GOAL #3   Title Pt. able to ambulate 100 feet with consistent 2 point gait pattern and use of least restrictive assistive devce to improve household mobility.    Baseline Pt. able to ambulate with use of RW/ CGA to min. A for safety.   Heavy use of B UE.; 9/30 pt able to ambulate with rollator with inconsistent 2 point gait pattern, heavy reliance on UE CGA/supervision  2/9: Pt. uses 2 point step pattern with walking with rollader with decreased stride length. Pt. would be limited due to pain in the knee walking for >100 ft.    Time 4    Period Weeks    Status Partially Met    Target Date 05/07/20      PT LONG TERM GOAL #4   Title Pt. able to stand from normal chair with no UE assist to improve safety/independence with transfers.     Baseline Unable to stand from standard chair without heavy UE assist. 10/6, pt unable to stand from standard chair without UE support.  11/10: benefits from 1 UE assist    2/9: Pt. can rise from normal chair with 1 UE assist and must control descent with UE assist.    Time 8    Period Weeks    Status Partially Met      PT LONG TERM GOAL #5   Title Pt. will perform a sit to stand with 1 UE support to be able to perform toileting activities with more independance.    Baseline Pt. requires 2 UE to push from to sit to stand from standard chair height. Pt. has grab rails available but can not get up by pulling.    Time 8    Period Weeks    Status Partially Met    Target Date 05/07/20      PT LONG  TERM GOAL #6   Title Pt. will ascend/ descend 4 steps with use of B handrails and step pattern  with mod. independence safely to be able to enter son's house.    Baseline Step pattern with heavy UE assist and CGA/min. A for safety.    Time 8    Period Weeks    Status Partially Met    Target Date 05/07/20               Plan - 04/08/20 1538    Clinical Impression Statement Pt limited with standing tolerance and walking tolerance with rollator due to R knee pain and occasional LBP. Pt performed resisted walking forwards and backwards in // bars to work on dynamic balance and BLE strength. Pt demonstrated good control with resistance pulling against pt with BUE support on handrails. Pt able to perform ball tosses on unstable surface (blue mat) for 2 min and 41 sec without BUE support of rollator before needing to have seated rest break. Pt only able to toelrate 2 more standing bouts standing for 2 min 5 sec the remaining two attempts. Pt can continue to benefit from skilled PT treatment to improve standing tolerance and BLE strength to reduce risk of falls.    Examination-Activity Limitations Toileting;Stand;Squat;Lift;Stairs    Stability/Clinical Decision Making Evolving/Moderate complexity    Rehab Potential Fair    PT Frequency 1x / week    PT Duration 8 weeks    PT Treatment/Interventions Moist Heat;Functional mobility training;Therapeutic activities;Therapeutic exercise;Balance training;Patient/family education;Manual techniques;Passive range of motion;Neuromuscular re-education;Stair training;Gait training;ADLs/Self Care Home Management    PT Next Visit Plan Decrease sitting time/ increase standing and walking tolerance.   Dynamic balance tasks.    PT Home Exercise Plan [id number]    Consulted and Agree with Plan of Care Patient;Family member/caregiver    Family Member Consulted spouse: [name]           Patient will benefit from skilled therapeutic intervention in order to improve the  following deficits and impairments:  Abnormal gait, Decreased endurance, Decreased activity tolerance, Pain, Decreased balance, Impaired flexibility, Decreased strength, Postural dysfunction  Visit Diagnosis: Muscle weakness (generalized)  Gait difficulty  Balance problem  Abnormal posture     Problem List Patient Active Problem List   Diagnosis Date Noted  . DVT femoral (deep venous thrombosis) with thrombophlebitis, left ([place]) 02/06/2020  . Lymphedema 04/15/2019  . Bilateral leg edema 01/25/2019  . Hyponatremia 12/24/2018  . SIADH (syndrome of inappropriate ADH production) ([place]) 12/24/2018  . 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 Generalized weakness 10/14/2018  . Hip fracture ([place]) 09/15/2018  . 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 HLD (hyperlipidemia) 03/24/2018  . Contusion of right knee 02/14/2018  . Bradycardia 12/28/2017  . 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 Tremor 09/04/2016  . Chronic pain of right knee 08/10/2016  . Right ankle pain 08/10/2016  . 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  . 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[place]) 12/11/2015  . Urinary retention 09/08/2015  . Lymphoma, 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[place]) 04/03/2015  . Breathlessness on exertion 11/21/2014  . Breath shortness 11/21/2014  . Arthropathy 11/07/2014  . Atrial flutter, paroxysmal ([place]) 11/07/2014  . Type 2 diabetes mellitus ([place]) 11/07/2014  . Benign essential tremor 11/07/2014  . Benign essential HTN 11/07/2014  . Mixed incontinence 11/07/2014  . Hypercholesterolemia without hypertriglyceridemia 11/07/2014  . Apnea, sleep 11/07/2014  . Controlled type 2 diabetes mellitus without complication ([place]) 96/22/2979  . Pure hypercholesterolemia 11/07/2014  . Other abnormalities of gait and mobility 11/02/2011  .  Decreased mobility 11/02/2011  .  Abnormal gait 06/29/2011  . Discoordination 06/29/2011   Pura Spice, PT, DPT # [id number] SPT 04/08/2020, 5:43 PM  [facility] [address] [place], [place], [address] Phone: [phone]   Fax:  [phone]  Name: Mike Holloway MRN: [id number] Date of Birth: [birth year]

## 2020-04-08 NOTE — Progress Notes (Signed)
Simple Catheter Placement  Due to urinary retention patient is present today for a foley cath placement.  Patient was cleaned and prepped in a sterile fashion with betadine and 2% lidocaine jelly was instilled into the urethra. A 16 FR coude foley catheter was inserted, urine return was noted  692ml, urine was yellow in color.  The balloon was filled with 10cc of sterile water.  A leg bag was attached for drainage. Patient was also given a night bag to take home and was given instruction on how to change from one bag to another.  Patient was given instruction on proper catheter care.  Patient tolerated well, no complications were noted   Performed by: Debroah Loop, PA-C

## 2020-04-08 NOTE — Progress Notes (Signed)
04/08/2020 4:22 PM   Mike Holloway December 20, 1940 [id number]  CC: Chief Complaint  Patient presents with  . Urinary Retention    HPI: Mike Holloway is a 79 y.o. male with PMH urinary retention with urethral erosion 2/2 indwelling Foley and history of failed self-catheterization with a recent failed SPT placement attempt due to overlying small bowel and sigmoid colon who presents today for evaluation of possible urinary retention.  Today patient's wife reports decreased urinary output, increased urinary frequency, and increased urinary leakage x4 days. PVR 579mL.  PMH: Past Medical History:  Diagnosis Date  . Arthritis   . Atrial flutter ([place])   . Diabetes mellitus ([place])   . Essential tremor   . Essential tremor    deep brain stimulator   . Hypercholesteremia   . Hypertension   . Incontinence   . 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[name])    grew on the testical  . SIADH (syndrome of inappropriate ADH production) ([name])   . Sleep apnea   . Stroke [facility])     Surgical History: Past Surgical History:  Procedure Laterality Date  . ABLATION    . APPENDECTOMY    . CATARACT EXTRACTION, BILATERAL    . COLONOSCOPY WITH PROPOFOL N/A 03/17/2018   Procedure: COLONOSCOPY WITH PROPOFOL;  Surgeon: Jonathon Bellows, MD;  Location: [facility] ENDOSCOPY;  Service: Gastroenterology;  Laterality: N/A;  . DEEP BRAIN STIMULATOR PLACEMENT    . 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 HEMORRHOID SURGERY    . HERNIA REPAIR    . HIP FRACTURE SURGERY    . INTRAMEDULLARY (IM) NAIL INTERTROCHANTERIC Right 09/16/2018   Procedure: INTRAMEDULLARY (IM) NAIL INTERTROCHANTRIC;  Surgeon: Hessie Knows, MD;  Location: [facility] ORS;  Service: Orthopedics;  Laterality: Right;  . IR CATHETER TUBE CHANGE  11/22/2018  . NASAL SINUS SURGERY    . ORCHIECTOMY    . TONSILLECTOMY      Home Medications:    Allergies as of 04/08/2020      Reactions   Clindamycin Diarrhea   Contracted C. Diff x 2   Flagyl [metronidazole] Swelling   Swollen tongue, excessive shaking,     Ambien  [zolpidem]    Other reaction(s): Other (See Comments) Disoriented and moody Other reaction(s): Other (See Comments) Disoriented and moody   Penicillins Rash   Has patient had a PCN reaction causing immediate rash, facial/tongue/throat swelling, SOB or lightheadedness with hypotension: Unknown Has patient had a PCN reaction causing severe rash involving mucus membranes or skin necrosis: Unknown Has patient had a PCN reaction that required hospitalization: No Has patient had a PCN reaction occurring within the last 10 years: No If all of the above answers are "NO", then may proceed with Cephalosporin use.   Sulfa Antibiotics Rash      Medication List       Accurate as of April 08, 2020  4:22 PM. If you have any questions, ask your nurse or doctor.        acetaminophen 650 MG CR tablet Commonly known as: TYLENOL Take 650 mg by mouth every 8 (eight) hours as needed for pain.   ACIDOPHILUS PO Take by mouth daily.   Azelastine HCl 0.15 % Soln U 1 SPRAY IEN BID   cholecalciferol 25 MCG (1000 UNIT) tablet Commonly known as: VITAMIN D Take 1,000 Units by mouth at bedtime.   ciprofloxacin 250 MG tablet Commonly known as: CIPRO  Take 1 tablet (250 mg total) by mouth every 12 (twelve) hours.   famotidine 20 MG tablet Commonly known as: PEPCID Take 20 mg by mouth 2 (two) times daily.   fexofenadine 180 MG tablet Commonly known as: ALLEGRA TK 1 T PO ONCE D   FLUoxetine 10 MG capsule Commonly known as: PROZAC Take 10 mg by mouth daily.   guaiFENesin-dextromethorphan 100-10 MG/5ML syrup Commonly known as: ROBITUSSIN DM Take 5 mLs by mouth every 4 (four) hours as needed for cough.   melatonin 5 MG Tabs Take 5 mg by mouth at bedtime. Takes 2 3 mg tablets   mirabegron ER 50 MG Tb24 tablet Commonly  known as: MYRBETRIQ Take 1 tablet (50 mg total) by mouth daily.   primidone 50 MG tablet Commonly known as: MYSOLINE Take 50-100 mg by mouth 2 (two) times daily. Take 2 tablets (100mg ) by mouth every morning and 1 tablet (50mg ) by mouth every night at bedtime   propranolol ER 160 MG SR capsule Commonly known as: INDERAL LA Take 160 mg by mouth daily.   psyllium 95 % Pack Commonly known as: HYDROCIL/METAMUCIL Take 1 packet by mouth daily.   Rivaroxaban Stater Pack (15 mg and 20 mg) Commonly known as: XARELTO STARTER PACK Follow package directions: Take one 15mg  tablet by mouth twice a day. On day 22, switch to one 20mg  tablet once a day. Take with food.   rivaroxaban 20 MG Tabs tablet Commonly known as: XARELTO Take 1 tablet (20 mg total) by mouth daily with supper.   tamsulosin 0.4 MG Caps capsule Commonly known as: FLOMAX Take by mouth daily.   UNABLE TO FIND APPLY FROM NECK DOWN DAILY   VSL#3 Caps Take 1 capsule by mouth daily.       Allergies:  Allergies  Allergen Reactions  . Clindamycin Diarrhea    Contracted C. Diff x 2  . Flagyl [Metronidazole] Swelling    Swollen tongue, excessive shaking,    . Ambien  [Zolpidem]     Other reaction(s): Other (See Comments) Disoriented and moody Other reaction(s): Other (See Comments) Disoriented and moody  . Penicillins Rash    Has patient had a PCN reaction causing immediate rash, facial/tongue/throat swelling, SOB or lightheadedness with hypotension: Unknown Has patient had a PCN reaction causing severe rash involving mucus membranes or skin necrosis: Unknown Has patient had a PCN reaction that required hospitalization: No Has patient had a PCN reaction occurring within the last 10 years: No If all of the above answers are "NO", then may proceed with Cephalosporin use.  . Sulfa Antibiotics Rash    Family History: Family History  Problem Relation Age of Onset  . Hematuria Father   . Prostate cancer Father   .  Diabetes Father   . Heart disease Mother   . Diabetes Mother   . Kidney disease Neg Hx   . Bladder Cancer Neg Hx     Social History:   reports that he has never smoked. He has never used smokeless tobacco. He reports that he does not drink alcohol and does not use drugs.  Physical Exam: BP 116/74 (BP Location: Left Arm, Patient Position: Sitting, Cuff Size: Large)   Pulse 73   Ht 5\' 11"  (1.803 m)   Wt 220 lb (99.8 kg)   BMI 30.68 kg/m   Constitutional:  Alert and oriented, no acute distress, nontoxic appearing HEENT: [place], AT Cardiovascular: No clubbing, cyanosis, or edema Respiratory: Normal respiratory effort, no increased work of breathing Skin: No  rashes, bruises or suspicious lesions Neurologic: Grossly intact, no focal deficits, moving all 4 extremities Psychiatric: Normal mood and affect  Laboratory Data: Results for orders placed or performed in visit on 04/08/20  Bladder Scan (Post Void Residual) in office  Result Value Ref Range   Scan Result 558mL    Assessment & Plan:   1. Urinary retention Recurrent urinary retention following recent failed SPT placement attempt by IR. I offered the patient Foley replacement today in clinic pending discussion between IR and Dr. Bernardo Heater regarding possible SPT re-attempt. He agreed, see separate procedure note for details. - Bladder Scan (Post Void Residual) in office  Return in about 4 weeks (around 05/06/2020) for Catheter exchange.  Debroah Loop, PA-C  [facility] [address] [place], [place] [address] [phone]

## 2020-04-08 NOTE — Patient Instructions (Signed)
Indwelling Urinary Catheter Care, Adult An indwelling urinary catheter is a thin, flexible, germ-free (sterile) tube that is placed into the bladder to help drain urine out of the body. The catheter is inserted into the part of the body that drains urine from the bladder (urethra). Urine drains from the catheter into a drainage bag outside of the body. Taking good care of your catheter will keep it working properly and help to prevent problems from developing. What are the risks?  Bacteria may get into your bladder and cause a urinary tract infection.  Urine flow can become blocked. This can happen if the catheter is not working correctly, or if you have sediment or a blood clot in your bladder or the catheter.  Tissue near the catheter may become irritated and bleed. How to wear your catheter and your drainage bag Supplies needed  Adhesive tape or a leg strap.  Alcohol wipe or soap and water (if you use tape).  A clean towel (if you use tape).  Overnight drainage bag.  Smaller drainage bag (leg bag). Wearing your catheter and bag Use adhesive tape or a leg strap to attach your catheter to your leg.  Make sure the catheter is not pulled tight.  If a leg strap gets wet, replace it with a dry one.  If you use adhesive tape: 1. Use an alcohol wipe or soap and water to wash off any stickiness on your skin where you had tape before. 2. Use a clean towel to pat-dry the area. 3. Apply the new tape. You should have received a large overnight drainage bag and a smaller leg bag that fits underneath clothing.  You may wear the overnight bag at any time, but you should not wear the leg bag at night.  Always wear the leg bag below your knee.  Make sure the overnight drainage bag is always lower than the level of your bladder, but do not let it touch the floor. Before you go to sleep, hang the bag inside a wastebasket that is covered by a clean plastic bag. How to care for your skin around  the catheter     Supplies needed  A clean washcloth.  Water and mild soap.  A clean towel. Caring for your skin and catheter  Every day, use a clean washcloth and soapy water to clean the skin around your catheter. 1. Wash your hands with soap and water. 2. Wet a washcloth in warm water and mild soap. 3. Clean the skin around your urethra.  If you are male:  Use one hand to gently spread the folds of skin around your vagina (labia).  With the washcloth in your other hand, wipe the inner side of your labia on each side. Do this in a front-to-back direction.  If you are male:  Use one hand to pull back any skin that covers the end of your penis (foreskin).  With the washcloth in your other hand, wipe your penis in small circles. Start wiping at the tip of your penis, then move outward from the catheter.  Move the foreskin back in place, if this applies. 4. With your free hand, hold the catheter close to where it enters your body. Keep holding the catheter during cleaning so it does not get pulled out. 5. Use your other hand to clean the catheter with the washcloth.  Only wipe downward on the catheter.  Do not wipe upward toward your body, because that may push bacteria into your urethra   and cause infection. 6. Use a clean towel to pat-dry the catheter and the skin around it. Make sure to wipe off all soap. 7. Wash your hands with soap and water.  Shower every day. Do not take baths.  Do not use cream, ointment, or lotion on the area where the catheter enters your body, unless your health care provider tells you to do that.  Do not use powders, sprays, or lotions on your genital area.  Check your skin around the catheter every day for signs of infection. Check for: ? Redness, swelling, or pain. ? Fluid or blood. ? Warmth. ? Pus or a bad smell. How to empty the drainage bag Supplies needed  Rubbing alcohol.  Gauze pad or cotton ball.  Adhesive tape or a leg  strap. Emptying the bag Empty your drainage bag (your overnight drainage bag or your leg bag) when it is ?- full, or at least 2-3 times a day. Clean the drainage bag according to the manufacturer's instructions or as told by your health care provider. 1. Wash your hands with soap and water. 2. Detach the drainage bag from your leg. 3. Hold the drainage bag over the toilet or a clean container. Make sure the drainage bag is lower than your hips and bladder. This stops urine from going back into the tubing and into your bladder. 4. Open the pour spout at the bottom of the bag. 5. Empty the urine into the toilet or container. Do not let the pour spout touch any surface. This precaution is important to prevent bacteria from getting in the bag and causing infection. 6. Apply rubbing alcohol to a gauze pad or cotton ball. 7. Use the gauze pad or cotton ball to clean the pour spout. 8. Close the pour spout. 9. Attach the bag to your leg with adhesive tape or a leg strap. 10. Wash your hands with soap and water. How to change the drainage bag Supplies needed:  Alcohol wipes.  A clean drainage bag.  Adhesive tape or a leg strap. Changing the bag Replace your drainage bag with a clean bag if it leaks, starts to smell bad, or looks dirty. 1. Wash your hands with soap and water. 2. Detach the dirty drainage bag from your leg. 3. Pinch the catheter with your fingers so that urine does not spill out. 4. Disconnect the catheter tube from the drainage tube at the connection valve. Do not let the tubes touch any surface. 5. Clean the end of the catheter tube with an alcohol wipe. Use a different alcohol wipe to clean the end of the drainage tube. 6. Connect the catheter tube to the drainage tube of the clean bag. 7. Attach the clean bag to your leg with adhesive tape or a leg strap. Avoid attaching the new bag too tightly. 8. Wash your hands with soap and water. General instructions   Never pull on  your catheter or try to remove it. Pulling can damage your internal tissues.  Always wash your hands before and after you handle your catheter or drainage bag. Use a mild, fragrance-free soap. If soap and water are not available, use hand sanitizer.  Always make sure there are no twists or bends (kinks) in the catheter tube.  Always make sure there are no leaks in the catheter or drainage bag.  Drink enough fluid to keep your urine pale yellow.  Do not take baths, swim, or use a hot tub.  If you are male, wipe from   front to back after having a bowel movement. Contact a health care provider if:  Your urine is cloudy.  Your urine smells unusually bad.  Your catheter gets clogged.  Your catheter starts to leak.  Your bladder feels full. Get help right away if:  You have redness, swelling, or pain where the catheter enters your body.  You have fluid, blood, pus, or a bad smell coming from the area where the catheter enters your body.  The area where the catheter enters your body feels warm to the touch.  You have a fever.  You have pain in your abdomen, legs, lower back, or bladder.  You see blood in the catheter.  Your urine is pink or red.  You have nausea, vomiting, or chills.  Your urine is not draining into the bag.  Your catheter gets pulled out. Summary  An indwelling urinary catheter is a thin, flexible, germ-free (sterile) tube that is placed into the bladder to help drain urine out of the body.  The catheter is inserted into the part of the body that drains urine from the bladder (urethra).  Take good care of your catheter to keep it working properly and help prevent problems from developing.  Always wash your hands before and after you handle your catheter or drainage bag.  Never pull on your catheter or try to remove it. This information is not intended to replace advice given to you by your health care provider. Make sure you discuss any questions  you have with your health care provider. Document Revised: 01/05/2019 Document Reviewed: 04/29/2017 Elsevier Patient Education  2020 [employer]  

## 2020-04-09 ENCOUNTER — Other Ambulatory Visit: Payer: Self-pay | Admitting: Urology

## 2020-04-09 ENCOUNTER — Telehealth: Payer: Self-pay | Admitting: Radiology

## 2020-04-09 DIAGNOSIS — R339 Retention of urine, unspecified: Secondary | ICD-10-CM

## 2020-04-09 NOTE — Telephone Encounter (Signed)
Wife states radiologist recommended placing suprapubic tube in the OR due to failed attempt in interventional radiology. Per Dr Bernardo Heater, discussed that placing patient under anesthesia to perform suprapubic tube placement in the OR is high risk and he does not recommend this. He offered to refer patient for second opinion. Patient is already established with [facility] and has an upcoming appointment on 05/28/2020 to discuss options.

## 2020-04-15 ENCOUNTER — Ambulatory Visit: Payer: Medicare Other | Admitting: Physical Therapy

## 2020-04-15 ENCOUNTER — Encounter: Payer: Self-pay | Admitting: Physical Therapy

## 2020-04-15 ENCOUNTER — Other Ambulatory Visit: Payer: Self-pay

## 2020-04-15 DIAGNOSIS — R293 Abnormal posture: Secondary | ICD-10-CM

## 2020-04-15 DIAGNOSIS — R269 Unspecified abnormalities of gait and mobility: Secondary | ICD-10-CM

## 2020-04-15 DIAGNOSIS — M6281 Muscle weakness (generalized): Secondary | ICD-10-CM

## 2020-04-15 DIAGNOSIS — R2689 Other abnormalities of gait and mobility: Secondary | ICD-10-CM

## 2020-04-15 NOTE — Therapy (Signed)
[facility] [address] [place], [place], [address] Phone: [phone]   Fax:  [phone]  Physical Therapy Treatment  Patient Details  Name: Mike Holloway MRN: [id number] Date of Birth: [birth year] Referring Provider (PT): Dr. Rudene Christians   Encounter Date: 04/15/2020   PT End of Session - 04/15/20 1447    Visit Number 40    Number of Visits 72    Date for PT Re-Evaluation 05/07/20    Authorization - Visit Number 6    Authorization - Number of Visits 10    PT Start Time 8185    PT Stop Time 6314    PT Time Calculation (min) 53 min    Equipment Utilized During Treatment Gait belt    Activity Tolerance Patient tolerated treatment well;Patient limited by pain;Patient limited by fatigue    Behavior During Therapy [facility] for tasks assessed/performed           Past Medical History:  Diagnosis Date  . Arthritis   . Atrial flutter ([place])   . Diabetes mellitus ([place])   . Essential tremor   . Essential tremor    deep brain stimulator   . Hypercholesteremia   . Hypertension   . Incontinence   . 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 SIADH (syndrome of inappropriate ADH production) ([place])   . Sleep apnea   . Stroke [facility])     Past Surgical History:  Procedure Laterality Date  . ABLATION    . APPENDECTOMY    . CATARACT EXTRACTION, BILATERAL    . COLONOSCOPY WITH PROPOFOL N/A 03/17/2018   Procedure: COLONOSCOPY WITH PROPOFOL;  Surgeon: Jonathon Bellows, MD;  Location: [facility] ENDOSCOPY;  Service: Gastroenterology;  Laterality: N/A;  . DEEP BRAIN STIMULATOR PLACEMENT    . 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 HEMORRHOID SURGERY    . HERNIA REPAIR    . HIP FRACTURE SURGERY    . INTRAMEDULLARY (IM) NAIL INTERTROCHANTERIC Right 09/16/2018   Procedure: INTRAMEDULLARY (IM) NAIL INTERTROCHANTRIC;   Surgeon: Hessie Knows, MD;  Location: [facility] ORS;  Service: Orthopedics;  Laterality: Right;  . IR CATHETER TUBE CHANGE  11/22/2018  . NASAL SINUS SURGERY    . ORCHIECTOMY    . TONSILLECTOMY      There were no vitals filed for this visit.   Subjective Assessment - 04/15/20 1446    Subjective Pt and spouse report that pt received a urethral catheter and that has been beneficial for the pt with incontinence. No reports of L knee pain and LBP prior to treatment and that pt is more active with walking inside his home.    Patient is accompained by: Family member    Pertinent History Pt. states R knee is bothering him but it is the typical discomfort he experiences    Limitations House hold activities;Walking;Standing;Lifting    Patient Stated Goals Improve LE strength/ gait and balance with daily tasks.  Improve gait/ safety/ progression to least assistive device.    Currently in Pain? No/denies    Pain Score 0-No pain    Pain Onset More than a month ago    Multiple Pain Sites No    Pain Onset More than a month ago          There.ex:   Nu-Step L5 for 10 min with LE's only. Heating pad placed on lumbar  spine Stairs: asc/desc 4 stairs x2. Step-to pattern with BUE support  Resisted walking in // bars: forwards/backwards x3 with BUE support needed  Neuro Re-Ed:   Obstacle course: cones weaves (3 cones), amb over unstable surface, step over objects to improve hip flexion. x2 with seated rest break in between bouts.  Side steps on airex pad in // bars: BUE support on bars. X4. Use of mirror as visual feedback for form/technique  Ambulating outside to car working on B heel strike, equal step length and decreased UE support on rollator.      PT Long Term Goals - 03/12/20 1442      PT LONG TERM GOAL #1   Title Pt. independent with HEP to increase B hip flexion/ R quad muscle strength 1/2 muscle grade to improve pain-free mobility.    Baseline R knee extension limited to 4/5 MMT secondary to  pain/ fear of pain. 2/9: Deferred.  4/13: 4+/5 MMT (max potential).    Time 4    Period Weeks    Status Achieved    Target Date 01/08/20      PT LONG TERM GOAL #2   Title Pt. will increase Berg balance test to >40 out of 56 to improve independence with gait/ decrease fall risk.    Baseline Berg: 22/56 (significant fall risk); 9/30 32/56.  11/10: 32 (limited by R knee pain)  2/9: 33/56 limited due to R knee and Low Back pain    Time 8    Period Weeks    Status Not Met    Target Date 05/07/20      PT LONG TERM GOAL #3   Title Pt. able to ambulate 100 feet with consistent 2 point gait pattern and use of least restrictive assistive devce to improve household mobility.    Baseline Pt. able to ambulate with use of RW/ CGA to min. A for safety.   Heavy use of B UE.; 9/30 pt able to ambulate with rollator with inconsistent 2 point gait pattern, heavy reliance on UE CGA/supervision  2/9: Pt. uses 2 point step pattern with walking with rollader with decreased stride length. Pt. would be limited due to pain in the knee walking for >100 ft.    Time 4    Period Weeks    Status Partially Met    Target Date 05/07/20      PT LONG TERM GOAL #4   Title Pt. able to stand from normal chair with no UE assist to improve safety/independence with transfers.     Baseline Unable to stand from standard chair without heavy UE assist. 10/6, pt unable to stand from standard chair without UE support.  11/10: benefits from 1 UE assist    2/9: Pt. can rise from normal chair with 1 UE assist and must control descent with UE assist.    Time 8    Period Weeks    Status Partially Met      PT LONG TERM GOAL #5   Title Pt. will perform a sit to stand with 1 UE support to be able to perform toileting activities with more independance.    Baseline Pt. requires 2 UE to push from to sit to stand from standard chair height. Pt. has grab rails available but can not get up by pulling.    Time 8    Period Weeks    Status  Partially Met    Target Date 05/07/20      PT LONG TERM GOAL #6  Title Pt. will ascend/ descend 4 steps with use of B handrails and step pattern with mod. independence safely to be able to enter son's house.    Baseline Step pattern with heavy UE assist and CGA/min. A for safety.    Time 8    Period Weeks    Status Partially Met    Target Date 05/07/20                 Plan - 04/15/20 1448    Clinical Impression Statement Pt performed two bouts of obstacle course for dynamic balance and gait tasks taking 6 minutes to complete before needing seated rest break between bouts. Pt asc/desc stairs 4x with step to pattern and use of B hand rails and resisted walking with increased diffculty walking backwards. Noted decreased step length and displayed increased difficulty with eccentric control. Pt can continue to benefit from skilled treatment to reduce risk of falls and to continue to improve standing and walking tolerance.    Examination-Activity Limitations Toileting;Stand;Squat;Lift;Stairs    Stability/Clinical Decision Making Evolving/Moderate complexity    Rehab Potential Fair    PT Frequency 1x / week    PT Duration 8 weeks    PT Treatment/Interventions Moist Heat;Functional mobility training;Therapeutic activities;Therapeutic exercise;Balance training;Patient/family education;Manual techniques;Passive range of motion;Neuromuscular re-education;Stair training;Gait training;ADLs/Self Care Home Management    PT Next Visit Plan Decrease sitting time/ increase standing and walking tolerance.   Dynamic balance tasks.    PT Home Exercise Plan [email or web address]    Consulted and Agree with Plan of Care Patient;Family member/caregiver    Family Member Consulted spouse: [name]           Patient will benefit from skilled therapeutic intervention in order to improve the following deficits and impairments:  Abnormal gait, Decreased endurance, Decreased activity tolerance, Pain, Decreased balance,  Impaired flexibility, Decreased strength, Postural dysfunction  Visit Diagnosis: Muscle weakness (generalized)  Gait difficulty  Balance problem  Abnormal posture     Problem List Patient Active Problem List   Diagnosis Date Noted  . DVT femoral (deep venous thrombosis) with thrombophlebitis, left ([place]) 02/06/2020  . Lymphedema 04/15/2019  . Bilateral leg edema 01/25/2019  . Hyponatremia 12/24/2018  . SIADH (syndrome of inappropriate ADH production) ([place]) 12/24/2018  . Erosion of urethra due to catheterization of urinary tract ([place]) 10/27/2018  . Generalized weakness 10/14/2018  . Hip fracture ([place]) 09/15/2018  . 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 HLD (hyperlipidemia) 03/24/2018  . Contusion of right knee 02/14/2018  . Bradycardia 12/28/2017  . 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 Tremor 09/04/2016  . Chronic pain of right knee 08/10/2016  . Right ankle pain 08/10/2016  . 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  . 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[place]) 12/11/2015  . Urinary retention 09/08/2015  . Lymphoma, 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[place]) 04/03/2015  . Breathlessness on exertion 11/21/2014  . Breath shortness 11/21/2014  . Arthropathy 11/07/2014  . Atrial flutter, paroxysmal ([place]) 11/07/2014  . Type 2 diabetes mellitus ([place]) 11/07/2014  . Benign essential tremor 11/07/2014  . Benign essential HTN 11/07/2014  . Mixed incontinence 11/07/2014  . Hypercholesterolemia without hypertriglyceridemia 11/07/2014  . Apnea, sleep 11/07/2014  . Controlled type 2 diabetes mellitus without complication ([place]) 16/06/9603  . Pure hypercholesterolemia 11/07/2014  . Other abnormalities of gait and mobility 11/02/2011  . Decreased mobility 11/02/2011  . Abnormal gait 06/29/2011  . Discoordination 06/29/2011   Pura Spice, PT, DPT # [id number]  Hadiyah Maricle, SPT 04/15/2020,  5:39 PM   [facility] [address] [place], [place], [address] Phone: [phone]   Fax:  [phone]  Name: Arbie Blankley MRN: [id number] Date of Birth: 1941-04-30

## 2020-04-22 ENCOUNTER — Encounter: Payer: Self-pay | Admitting: Physical Therapy

## 2020-04-22 ENCOUNTER — Ambulatory Visit: Payer: Medicare Other | Admitting: Physical Therapy

## 2020-04-22 ENCOUNTER — Other Ambulatory Visit: Payer: Self-pay

## 2020-04-22 DIAGNOSIS — R293 Abnormal posture: Secondary | ICD-10-CM

## 2020-04-22 DIAGNOSIS — M6281 Muscle weakness (generalized): Secondary | ICD-10-CM

## 2020-04-22 DIAGNOSIS — R2689 Other abnormalities of gait and mobility: Secondary | ICD-10-CM

## 2020-04-22 DIAGNOSIS — R269 Unspecified abnormalities of gait and mobility: Secondary | ICD-10-CM

## 2020-04-22 NOTE — Therapy (Signed)
[facility] [address] [place], [place], [address] Phone: [phone]   Fax:  [phone]  Physical Therapy Treatment  Patient Details  Name: Mike Holloway MRN: [id number] Date of Birth: [birth year] Referring Provider (PT): Dr. Rudene Christians   Encounter Date: 04/22/2020   PT End of Session - 04/22/20 1550    Visit Number 66    Number of Visits 72    Date for PT Re-Evaluation 05/07/20    Authorization - Visit Number 7    Authorization - Number of Visits 10    PT Start Time 5498    PT Stop Time 2641    PT Time Calculation (min) 51 min    Equipment Utilized During Treatment Gait belt    Activity Tolerance Patient tolerated treatment well;Patient limited by pain;Patient limited by fatigue    Behavior During Therapy [facility] for tasks assessed/performed           Past Medical History:  Diagnosis Date  . Arthritis   . Atrial flutter ([place])   . Diabetes mellitus ([place])   . Essential tremor   . Essential tremor    deep brain stimulator   . Hypercholesteremia   . Hypertension   . Incontinence   . 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[place])    grew on the testical  . SIADH (syndrome of inappropriate ADH production) ([place])   . Sleep apnea   . Stroke [facility])     Past Surgical History:  Procedure Laterality Date  . ABLATION    . APPENDECTOMY    . CATARACT EXTRACTION, BILATERAL    . COLONOSCOPY WITH PROPOFOL N/A 03/17/2018   Procedure: COLONOSCOPY WITH PROPOFOL;  Surgeon: Jonathon Bellows, MD;  Location: [facility] ENDOSCOPY;  Service: Gastroenterology;  Laterality: N/A;  . DEEP BRAIN STIMULATOR PLACEMENT    . 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 HEMORRHOID SURGERY    . HERNIA REPAIR    . HIP FRACTURE SURGERY    . INTRAMEDULLARY (IM) NAIL INTERTROCHANTERIC Right 09/16/2018   Procedure: INTRAMEDULLARY (IM) NAIL INTERTROCHANTRIC;   Surgeon: Hessie Knows, MD;  Location: [facility] ORS;  Service: Orthopedics;  Laterality: Right;  . IR CATHETER TUBE CHANGE  11/22/2018  . NASAL SINUS SURGERY    . ORCHIECTOMY    . TONSILLECTOMY      There were no vitals filed for this visit.   Subjective Assessment - 04/22/20 1543    Subjective Pt and spouse report pt invited to get together over the weekend and that entering the home requires 6 stairs to enter with single hand rail on R side with asc. Pt's spouse reports pt has been in "better moods and smiling more" since cather put in place and pt has been more motivated to be moving more and socializing.    Patient is accompained by: Family member    Pertinent History Pt. states R knee is bothering him but it is the typical discomfort he experiences    Limitations House hold activities;Walking;Standing;Lifting    Patient Stated Goals Improve LE strength/ gait and balance with daily tasks.  Improve gait/ safety/ progression to least assistive device.    Currently in Pain? No/denies    Pain Score 0-No pain    Pain Onset More than a month ago    Multiple Pain Sites No    Pain Onset More than a month ago  There.ex:  No charge Nu-Step L4 for 10 min with hot pack on lumbar spine. LE's only   Neuro Re-Ed:  Obstacle course: walking over uneven surface (blue mat), weaving through 3 cones, asc/desc stairs with B handrails: x4 seated rest break after 2 bouts each.  Stairs: pt and spouse educated on side stepping technique with use of single hand rail on R with step to pattern. x4 with pt demonstrating safe ability to asc/desc with no LOB and good motor planning.    Standing ball tosses for perturbation to improve hip/ankle strategy and standing with no BUE support: 2 min in multiple planes   Ambulation to vehicle focusing on normal gait mechanics of B heel strike and equal step length.    PT Long Term Goals - 03/12/20 1442      PT LONG TERM GOAL #1   Title Pt. independent with  HEP to increase B hip flexion/ R quad muscle strength 1/2 muscle grade to improve pain-free mobility.    Baseline R knee extension limited to 4/5 MMT secondary to pain/ fear of pain. 2/9: Deferred.  4/13: 4+/5 MMT (max potential).    Time 4    Period Weeks    Status Achieved    Target Date 01/08/20      PT LONG TERM GOAL #2   Title Pt. will increase Berg balance test to >40 out of 56 to improve independence with gait/ decrease fall risk.    Baseline Berg: 22/56 (significant fall risk); 9/30 32/56.  11/10: 32 (limited by R knee pain)  2/9: 33/56 limited due to R knee and Low Back pain    Time 8    Period Weeks    Status Not Met    Target Date 05/07/20      PT LONG TERM GOAL #3   Title Pt. able to ambulate 100 feet with consistent 2 point gait pattern and use of least restrictive assistive devce to improve household mobility.    Baseline Pt. able to ambulate with use of RW/ CGA to min. A for safety.   Heavy use of B UE.; 9/30 pt able to ambulate with rollator with inconsistent 2 point gait pattern, heavy reliance on UE CGA/supervision  2/9: Pt. uses 2 point step pattern with walking with rollader with decreased stride length. Pt. would be limited due to pain in the knee walking for >100 ft.    Time 4    Period Weeks    Status Partially Met    Target Date 05/07/20      PT LONG TERM GOAL #4   Title Pt. able to stand from normal chair with no UE assist to improve safety/independence with transfers.     Baseline Unable to stand from standard chair without heavy UE assist. 10/6, pt unable to stand from standard chair without UE support.  11/10: benefits from 1 UE assist    2/9: Pt. can rise from normal chair with 1 UE assist and must control descent with UE assist.    Time 8    Period Weeks    Status Partially Met      PT LONG TERM GOAL #5   Title Pt. will perform a sit to stand with 1 UE support to be able to perform toileting activities with more independance.    Baseline Pt. requires 2 UE  to push from to sit to stand from standard chair height. Pt. has grab rails available but can not get up by pulling.    Time  8    Period Weeks    Status Partially Met    Target Date 05/07/20      PT LONG TERM GOAL #6   Title Pt. will ascend/ descend 4 steps with use of B handrails and step pattern with mod. independence safely to be able to enter son's house.    Baseline Step pattern with heavy UE assist and CGA/min. A for safety.    Time 8    Period Weeks    Status Partially Met    Target Date 05/07/20                 Plan - 04/22/20 1551    Clinical Impression Statement Pt demonstrated safe ability to asc/desc up 4 stairs with side stepping step-to pattern with single railing on R side used. Pt safely able to perform 3 bouts of asc/desc with no LOB and good control with this technique. Pt and spouse educated on pt performing this technique at a party over the weekend the pt wishes to attend. Pt improving in his tolerance for standing and walking by being able to complete obstacle course twice before needing a rest break.    Examination-Activity Limitations Toileting;Stand;Squat;Lift;Stairs    Stability/Clinical Decision Making Evolving/Moderate complexity    Clinical Decision Making Moderate    Rehab Potential Fair    PT Frequency 1x / week    PT Duration 8 weeks    PT Treatment/Interventions Moist Heat;Functional mobility training;Therapeutic activities;Therapeutic exercise;Balance training;Patient/family education;Manual techniques;Passive range of motion;Neuromuscular re-education;Stair training;Gait training;ADLs/Self Care Home Management    PT Next Visit Plan Dynamic gait and balance tasks. Glut med strength.   REVIEW INSURANCE ISSUE    PT Home Exercise Plan [facility]    Consulted and Agree with Plan of Care Patient;Family member/caregiver    Family Member Consulted spouse: [name]           Patient will benefit from skilled therapeutic intervention in order to improve the  following deficits and impairments:  Abnormal gait, Decreased endurance, Decreased activity tolerance, Pain, Decreased balance, Impaired flexibility, Decreased strength, Postural dysfunction  Visit Diagnosis: Muscle weakness (generalized)  Gait difficulty  Balance problem  Abnormal posture     Problem List Patient Active Problem List   Diagnosis Date Noted  . DVT femoral (deep venous thrombosis) with thrombophlebitis, left ([place]) 02/06/2020  . Lymphedema 04/15/2019  . Bilateral leg edema 01/25/2019  . Hyponatremia 12/24/2018  . SIADH (syndrome of inappropriate ADH production) ([place]) 12/24/2018  . Erosion of urethra due to catheterization of urinary tract ([place]) 10/27/2018  . Generalized weakness 10/14/2018  . Hip fracture ([place]) 09/15/2018  . 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 HLD (hyperlipidemia) 03/24/2018  . Contusion of right knee 02/14/2018  . Bradycardia 12/28/2017  . 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 Tremor 09/04/2016  . Chronic pain of right knee 08/10/2016  . Right ankle pain 08/10/2016  . 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  . 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[place]) 12/11/2015  . Urinary retention 09/08/2015  . Lymphoma, 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[place]) 04/03/2015  . Breathlessness on exertion 11/21/2014  . Breath shortness 11/21/2014  . Arthropathy 11/07/2014  . Atrial flutter, paroxysmal ([place]) 11/07/2014  . Type 2 diabetes mellitus ([place]) 11/07/2014  . Benign essential tremor 11/07/2014  . Benign essential HTN 11/07/2014  . Mixed incontinence 11/07/2014  . Hypercholesterolemia without hypertriglyceridemia 11/07/2014  . Apnea, sleep 11/07/2014  . Controlled type 2 diabetes mellitus without complication ([place]) 37/62/8315  .  Pure hypercholesterolemia 11/07/2014  . Other abnormalities of gait and mobility 11/02/2011  . Decreased mobility 11/02/2011  .  Abnormal gait 06/29/2011  . Discoordination 06/29/2011   Pura Spice, PT, DPT # [id number] SPT 04/23/2020, 7:02 AM  [place] [facility] [address] [place], [place], [address] Phone: [phone]   Fax:  [phone]  Name: Mike Holloway MRN: [id number] Date of Birth: 10/05/1940

## 2020-04-29 ENCOUNTER — Encounter: Payer: Medicare Other | Admitting: Physical Therapy

## 2020-05-01 ENCOUNTER — Encounter: Payer: Self-pay | Admitting: Physical Therapy

## 2020-05-01 ENCOUNTER — Ambulatory Visit: Payer: Medicare Other | Attending: Orthopedic Surgery | Admitting: Physical Therapy

## 2020-05-01 ENCOUNTER — Other Ambulatory Visit: Payer: Self-pay

## 2020-05-01 DIAGNOSIS — M6281 Muscle weakness (generalized): Secondary | ICD-10-CM | POA: Diagnosis not present

## 2020-05-01 DIAGNOSIS — R269 Unspecified abnormalities of gait and mobility: Secondary | ICD-10-CM | POA: Diagnosis present

## 2020-05-01 DIAGNOSIS — R2689 Other abnormalities of gait and mobility: Secondary | ICD-10-CM

## 2020-05-01 DIAGNOSIS — R293 Abnormal posture: Secondary | ICD-10-CM | POA: Diagnosis present

## 2020-05-01 NOTE — Therapy (Signed)
[facility] [address] [place], [place], [address] Phone: [phone]   Fax:  [phone]  Physical Therapy Treatment  Patient Details  Name: Mike Holloway MRN: [id number] Date of Birth: [birth year] Referring Provider (PT): Dr. Rudene Christians   Encounter Date: 05/01/2020   PT End of Session - 05/01/20 1544    Visit Number 60    Number of Visits 72    Date for PT Re-Evaluation 05/07/20    Authorization - Visit Number 8    Authorization - Number of Visits 10    PT Start Time 6440    PT Stop Time 1408    PT Time Calculation (min) 69 min    Equipment Utilized During Treatment Gait belt    Activity Tolerance Patient tolerated treatment well;Patient limited by pain;Patient limited by fatigue    Behavior During Therapy [facility] for tasks assessed/performed           Past Medical History:  Diagnosis Date  . Arthritis   . Atrial flutter ([place])   . Diabetes mellitus ([place])   . Essential tremor   . Essential tremor    deep brain stimulator   . Hypercholesteremia   . Hypertension   . Incontinence   . 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[place])    grew on the testical  . SIADH (syndrome of inappropriate ADH production) ([place])   . Sleep apnea   . Stroke [facility])     Past Surgical History:  Procedure Laterality Date  . ABLATION    . APPENDECTOMY    . CATARACT EXTRACTION, BILATERAL    . COLONOSCOPY WITH PROPOFOL N/A 03/17/2018   Procedure: COLONOSCOPY WITH PROPOFOL;  Surgeon: Jonathon Bellows, MD;  Location: [facility] ENDOSCOPY;  Service: Gastroenterology;  Laterality: N/A;  . DEEP BRAIN STIMULATOR PLACEMENT    . 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 HEMORRHOID SURGERY    . HERNIA REPAIR    . HIP FRACTURE SURGERY    . INTRAMEDULLARY (IM) NAIL INTERTROCHANTERIC Right 09/16/2018   Procedure: INTRAMEDULLARY (IM) NAIL INTERTROCHANTRIC;  Surgeon:  Hessie Knows, MD;  Location: [facility] ORS;  Service: Orthopedics;  Laterality: Right;  . IR CATHETER TUBE CHANGE  11/22/2018  . NASAL SINUS SURGERY    . ORCHIECTOMY    . TONSILLECTOMY      There were no vitals filed for this visit.   Subjective Assessment - 05/01/20 1542    Subjective Pt reports he was able to attend party with spouse and he was able to safely asc/desc stairs with techniques practiced with therapy. Pt also said he was able to safel walk in grass with rollator.    Patient is accompained by: Family member    Pertinent History Pt. states R knee is bothering him but it is the typical discomfort he experiences    Limitations House hold activities;Walking;Standing;Lifting    Patient Stated Goals Improve LE strength/ gait and balance with daily tasks.  Improve gait/ safety/ progression to least assistive device.    Currently in Pain? No/denies    Pain Score 0-No pain    Pain Onset More than a month ago    Pain Onset More than a month ago           There.ex:   Sit to stand with hands on arm rests: 2x10 6" step-ups B with BUE support on // bars: 2x10 6" L/R side steps onto 6"  step with BUE support on // bars: 1x8 Nu-Step L5 for 10 min with use of UE's/LE's and hot pack on lumbar spine.   Neuro Re-Ed:   Walking forwards/backwards in // bars with focus on heel strike for walking forward and toe strike walking backwards. x4  Side-Stepping R and L in // bars for lat hip strength: x2 L, x2 R  Obstacle course x2 with rollator: stepping over markings on floor to improve step length, amb over blue mat (unstable surface), stepping over 1/2 bolster, weaving around cones.    PT Long Term Goals - 03/12/20 1442      PT LONG TERM GOAL #1   Title Pt. independent with HEP to increase B hip flexion/ R quad muscle strength 1/2 muscle grade to improve pain-free mobility.    Baseline R knee extension limited to 4/5 MMT secondary to pain/ fear of pain. 2/9: Deferred.  4/13: 4+/5 MMT (max  potential).    Time 4    Period Weeks    Status Achieved    Target Date 01/08/20      PT LONG TERM GOAL #2   Title Pt. will increase Berg balance test to >40 out of 56 to improve independence with gait/ decrease fall risk.    Baseline Berg: 22/56 (significant fall risk); 9/30 32/56.  11/10: 32 (limited by R knee pain)  2/9: 33/56 limited due to R knee and Low Back pain    Time 8    Period Weeks    Status Not Met    Target Date 05/07/20      PT LONG TERM GOAL #3   Title Pt. able to ambulate 100 feet with consistent 2 point gait pattern and use of least restrictive assistive devce to improve household mobility.    Baseline Pt. able to ambulate with use of RW/ CGA to min. A for safety.   Heavy use of B UE.; 9/30 pt able to ambulate with rollator with inconsistent 2 point gait pattern, heavy reliance on UE CGA/supervision  2/9: Pt. uses 2 point step pattern with walking with rollader with decreased stride length. Pt. would be limited due to pain in the knee walking for >100 ft.    Time 4    Period Weeks    Status Partially Met    Target Date 05/07/20      PT LONG TERM GOAL #4   Title Pt. able to stand from normal chair with no UE assist to improve safety/independence with transfers.     Baseline Unable to stand from standard chair without heavy UE assist. 10/6, pt unable to stand from standard chair without UE support.  11/10: benefits from 1 UE assist    2/9: Pt. can rise from normal chair with 1 UE assist and must control descent with UE assist.    Time 8    Period Weeks    Status Partially Met      PT LONG TERM GOAL #5   Title Pt. will perform a sit to stand with 1 UE support to be able to perform toileting activities with more independance.    Baseline Pt. requires 2 UE to push from to sit to stand from standard chair height. Pt. has grab rails available but can not get up by pulling.    Time 8    Period Weeks    Status Partially Met    Target Date 05/07/20      PT LONG TERM GOAL  #6   Title Pt. will  ascend/ descend 4 steps with use of B handrails and step pattern with mod. independence safely to be able to enter son's house.    Baseline Step pattern with heavy UE assist and CGA/min. A for safety.    Time 8    Period Weeks    Status Partially Met    Target Date 05/07/20                 Plan - 05/01/20 1546    Clinical Impression Statement Pt demonstrated ability to perform step ups on 6" steps with no c/o R knee pain but requires BUE support on handrails. Able to perform obstacle course x2 without seated rest breaks between trials demonstrating improvements in pt's fatigue levels. Pt can continue to benefit from skilled PT to improve wlaking tolerance to improve functional mobility and reduce risk of falls.    Examination-Activity Limitations Toileting;Stand;Squat;Lift;Stairs    Stability/Clinical Decision Making Evolving/Moderate complexity    Rehab Potential Fair    PT Frequency 1x / week    PT Duration 8 weeks    PT Treatment/Interventions Moist Heat;Functional mobility training;Therapeutic activities;Therapeutic exercise;Balance training;Patient/family education;Manual techniques;Passive range of motion;Neuromuscular re-education;Stair training;Gait training;ADLs/Self Care Home Management    PT Next Visit Plan Hip strength, dynamic gait    PT Home Exercise Plan [email or web address]    Consulted and Agree with Plan of Care Patient;Family member/caregiver    Family Member Consulted spouse: [name]           Patient will benefit from skilled therapeutic intervention in order to improve the following deficits and impairments:  Abnormal gait, Decreased endurance, Decreased activity tolerance, Pain, Decreased balance, Impaired flexibility, Decreased strength, Postural dysfunction  Visit Diagnosis: Muscle weakness (generalized)  Gait difficulty  Balance problem  Abnormal posture     Problem List Patient Active Problem List   Diagnosis Date Noted  . DVT femoral  (deep venous thrombosis) with thrombophlebitis, left ([place]) 02/06/2020  . Lymphedema 04/15/2019  . Bilateral leg edema 01/25/2019  . Hyponatremia 12/24/2018  . SIADH (syndrome of inappropriate ADH production) ([place]) 12/24/2018  . Erosion of urethra due to catheterization of urinary tract ([place]) 10/27/2018  . Generalized weakness 10/14/2018  . Hip fracture ([place]) 09/15/2018  . 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 HLD (hyperlipidemia) 03/24/2018  . Contusion of right knee 02/14/2018  . Bradycardia 12/28/2017  . 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 Tremor 09/04/2016  . Chronic pain of right knee 08/10/2016  . Right ankle pain 08/10/2016  . 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  . 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[place]) 12/11/2015  . Urinary retention 09/08/2015  . Lymphoma, 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[place]) 04/03/2015  . Breathlessness on exertion 11/21/2014  . Breath shortness 11/21/2014  . Arthropathy 11/07/2014  . Atrial flutter, paroxysmal ([place]) 11/07/2014  . Type 2 diabetes mellitus ([place]) 11/07/2014  . Benign essential tremor 11/07/2014  . Benign essential HTN 11/07/2014  . Mixed incontinence 11/07/2014  . Hypercholesterolemia without hypertriglyceridemia 11/07/2014  . Apnea, sleep 11/07/2014  . Controlled type 2 diabetes mellitus without complication ([place]) 91/47/8295  . Pure hypercholesterolemia 11/07/2014  . Other abnormalities of gait and mobility 11/02/2011  . Decreased mobility 11/02/2011  . Abnormal gait 06/29/2011  . Discoordination 06/29/2011   Pura Spice, PT, DPT # [id number] Larna Daughters, SPT 05/02/2020, 1:05 PM  [place] [address] [place], [place], [address] Phone: [phone]   Fax:  [phone]  Name: Mike Holloway  Mike Holloway MRN: [id number] Date of Birth: [birth year]

## 2020-05-06 ENCOUNTER — Ambulatory Visit: Payer: Medicare Other | Admitting: Physical Therapy

## 2020-05-06 ENCOUNTER — Encounter: Payer: Self-pay | Admitting: Physical Therapy

## 2020-05-06 ENCOUNTER — Other Ambulatory Visit: Payer: Self-pay

## 2020-05-06 DIAGNOSIS — R2689 Other abnormalities of gait and mobility: Secondary | ICD-10-CM

## 2020-05-06 DIAGNOSIS — R269 Unspecified abnormalities of gait and mobility: Secondary | ICD-10-CM

## 2020-05-06 DIAGNOSIS — M6281 Muscle weakness (generalized): Secondary | ICD-10-CM | POA: Diagnosis not present

## 2020-05-06 DIAGNOSIS — R293 Abnormal posture: Secondary | ICD-10-CM

## 2020-05-06 NOTE — Therapy (Signed)
[place] [facility] [address] [place], [place], [address] Phone: [phone]   Fax:  [phone]  Physical Therapy Treatment  Patient Details  Name: Mike Holloway MRN: [id number] Date of Birth: 1940-11-16 Referring Provider (PT): Dr. Rudene Christians   Encounter Date: 05/06/2020   PT End of Session - 05/06/20 1509    Visit Number 72    Number of Visits 80    Date for PT Re-Evaluation 07/01/20    Authorization - Visit Number 1    Authorization - Number of Visits 10    PT Start Time 1306    PT Stop Time 1402    PT Time Calculation (min) 56 min    Equipment Utilized During Treatment Gait belt    Activity Tolerance Patient tolerated treatment well;Patient limited by fatigue    Behavior During Therapy [facility] for tasks assessed/performed           Past Medical History:  Diagnosis Date  . Arthritis   . Atrial flutter ([place])   . Diabetes mellitus ([place])   . Essential tremor   . Essential tremor    deep brain stimulator   . Hypercholesteremia   . Hypertension   . Incontinence   . 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[place])    grew on the testical  . SIADH (syndrome of inappropriate ADH production) ([place])   . Sleep apnea   . Stroke [facility])     Past Surgical History:  Procedure Laterality Date  . ABLATION    . APPENDECTOMY    . CATARACT EXTRACTION, BILATERAL    . COLONOSCOPY WITH PROPOFOL N/A 03/17/2018   Procedure: COLONOSCOPY WITH PROPOFOL;  Surgeon: Jonathon Bellows, MD;  Location: [facility] ENDOSCOPY;  Service: Gastroenterology;  Laterality: N/A;  . DEEP BRAIN STIMULATOR PLACEMENT    . 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 HEMORRHOID SURGERY    . HERNIA REPAIR    . HIP FRACTURE SURGERY    . INTRAMEDULLARY (IM) NAIL INTERTROCHANTERIC Right 09/16/2018   Procedure: INTRAMEDULLARY (IM) NAIL INTERTROCHANTRIC;  Surgeon: Hessie Knows, MD;   Location: [facility] ORS;  Service: Orthopedics;  Laterality: Right;  . IR CATHETER TUBE CHANGE  11/22/2018  . NASAL SINUS SURGERY    . ORCHIECTOMY    . TONSILLECTOMY      There were no vitals filed for this visit.   Subjective Assessment - 05/06/20 1507    Subjective Pt states being able to stand at sink to wash his hands without any UE support with no LOB or issues. Pt reports he believes that something is wrong with his R shoe lift and spouse plans on contacting [employer] to get it assessed.    Patient is accompained by: Family member    Pertinent History Pt. states R knee is bothering him but it is the typical discomfort he experiences    Limitations House hold activities;Walking;Standing;Lifting    Patient Stated Goals Improve LE strength/ gait and balance with daily tasks.  Improve gait/ safety/ progression to least assistive device.    Currently in Pain? No/denies    Pain Score 0-No pain    Pain Onset More than a month ago    Multiple Pain Sites No    Pain Onset More than a month ago           There.ex:   Nu-Step L5 for 10 min. BLE's only. Heat pack placed on B low  back. Non-billable time.  Neuro Re-Ed:  Goals Reassessed today to display further need from skilled PT services:   Reassessed BERG: 31/56   Pt displayed ability to safely asc/desc 4 stairs with B handrails and single handrail use with side steps with step to pattern and safely.   Pt able to amb with 2 point step pattern with rollator 100'.   Pt still requires BUE support to stand from standard height chair. Able to stand with single UE support with airex pad in seat.     Neuro Re-Ed treatment:    Standing with no BUE support stacking cones on computer tray: 2 min before needing a rest break. No LOB or sway noted.   STS with single UE use with airex pad under seat: 1x10     [facility] PT Assessment - 05/06/20 0001      Assessment   Medical Diagnosis S/p R subtrochanteric hip fracture, S/p R ORIF fracture of hip.       Referring Provider (PT) Dr. Rudene Christians    Onset Date/Surgical Date 09/15/18    Prior Therapy Yes, known well to PT      Prior Function   Level of Independence Independent with household mobility with device      Cognition   Overall Cognitive Status Within Functional Limits for tasks assessed           PT Long Term Goals - 05/06/20 1510      PT LONG TERM GOAL #1   Title Pt. independent with HEP to increase B hip flexion/ R quad muscle strength 1/2 muscle grade to improve pain-free mobility.    Baseline R knee extension limited to 4/5 MMT secondary to pain/ fear of pain. 2/9: Deferred.  4/13: 4+/5 MMT (max potential).    Time 4    Period Weeks    Status Achieved      PT LONG TERM GOAL #2   Title Pt. will increase Berg balance test to >40 out of 56 to improve independence with gait/ decrease fall risk.    Baseline Berg: 22/56 (significant fall risk); 9/30 32/56.  11/10: 32 (limited by R knee pain)  2/9: 33/56 limited due to R knee and Low Back pain; 8/10: 29/56    Time 8    Period Weeks    Status Not Met    Target Date 07/01/20      PT LONG TERM GOAL #3   Title Pt. able to ambulate 100 feet with consistent 2 point gait pattern and use of least restrictive assistive devce to improve household mobility.    Baseline Pt. able to ambulate with use of RW/ CGA to min. A for safety.   Heavy use of B UE.; 9/30 pt able to ambulate with rollator with inconsistent 2 point gait pattern, heavy reliance on UE CGA/supervision  2/9: Pt. uses 2 point step pattern with walking with rollader with decreased stride length. Pt. would be limited due to pain in the knee walking for >100 ft.; 8/10: Pt demonstrates ability to ambulate 100' with consistent 2 point gait pattern with rollator.    Time 0    Period Weeks    Status Achieved    Target Date 05/06/20      PT LONG TERM GOAL #4   Title Pt. able to stand from normal chair with no UE assist to improve safety/independence with transfers.     Baseline  Unable to stand from standard chair without heavy UE assist. 10/6, pt unable to stand from  standard chair without UE support.  11/10: benefits from 1 UE assist    2/9: Pt. can rise from normal chair with 1 UE assist and must control descent with UE assist.; 8/10: Pt requires 2 UE for standing from standard chair height.    Time 8    Period Weeks    Status Partially Met    Target Date 07/01/20      PT LONG TERM GOAL #5   Title Pt. will perform a sit to stand with 1 UE support to be able to perform toileting activities with more independance.    Baseline Pt. requires 2 UE to push from to sit to stand from standard chair height. Pt. has grab rails available but can not get up by pulling.; 8/10: pt requires 2 UE's to STS from standard chair height.    Time 8    Period Weeks    Status Partially Met    Target Date 07/01/20      Additional Long Term Goals   Additional Long Term Goals Yes      PT LONG TERM GOAL #6   Title Pt. will ascend/ descend 4 steps with use of B handrails and step pattern with mod. independence safely to be able to enter son's house.    Baseline Step pattern with heavy UE assist and CGA/min. A for safety.; 8/10: Pt safely asc/desc 4 steps with B hand rails and step to pattern with mod indep to enter son's house safely.    Time 0    Period Weeks    Status Achieved    Target Date 05/06/20      PT LONG TERM GOAL #7   Title Pt will be able to stand at bathroom sink for 5 min with no UE support to complete ADL's like brushing his teeth, washing his hands, and combing his hair.    Baseline 8/10: Ability to stand with no UE support for 2 min.    Time 8    Period Weeks    Status New    Target Date 07/01/20              Plan - 05/06/20 1534    Clinical Impression Statement Pt's goals reassessed today. Pt still displays need for skilled PT to improve functional mobility. Pt accomplished two of his goals today, being able to safely asc/desc 4 stairs with BUE support on  handrails and step to pattern safely along with amb 100' with consistent 2 point pattern with LRAD (rollator currently). Although pt is making progress, pt still presents as a high falls risk scoring a 31/56 on the BERG and also requires BUE support on hand rials to stand from standard height chair making toileting independently difficult. Pt and spouse's goals are to improve pt's ability to stand with no UE support in order to complete basic ADL's with supervision or minA such as brushing teeth, washing hands, combing hair. Pt can continue to benefit from skilled PT to improve fucntional mobility and reduce risk of falls.    Examination-Activity Limitations Toileting;Stand;Squat;Lift;Stairs    Stability/Clinical Decision Making Evolving/Moderate complexity    Rehab Potential Fair    PT Frequency 1x / week    PT Duration 8 weeks    PT Treatment/Interventions Moist Heat;Functional mobility training;Therapeutic activities;Therapeutic exercise;Balance training;Patient/family education;Manual techniques;Passive range of motion;Neuromuscular re-education;Stair training;Gait training;ADLs/Self Care Home Management    PT Next Visit Plan Hip strength, dynamic gait    PT Home Exercise Plan [email or web address]    Consulted and  Agree with Plan of Care Patient;Family member/caregiver    Family Member Consulted spouse: [name]           Patient will benefit from skilled therapeutic intervention in order to improve the following deficits and impairments:  Abnormal gait, Decreased endurance, Decreased activity tolerance, Pain, Decreased balance, Impaired flexibility, Decreased strength, Postural dysfunction  Visit Diagnosis: Muscle weakness (generalized)  Gait difficulty  Balance problem  Abnormal posture     Problem List Patient Active Problem List   Diagnosis Date Noted  . DVT femoral (deep venous thrombosis) with thrombophlebitis, left ([place]) 02/06/2020  . Lymphedema 04/15/2019  . Bilateral leg edema  01/25/2019  . Hyponatremia 12/24/2018  . SIADH (syndrome of inappropriate ADH production) ([place]) 12/24/2018  . Erosion of urethra due to catheterization of urinary tract ([place]) 10/27/2018  . Generalized weakness 10/14/2018  . Hip fracture ([place]) 09/15/2018  . 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 HLD (hyperlipidemia) 03/24/2018  . Contusion of right knee 02/14/2018  . Bradycardia 12/28/2017  . 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 Tremor 09/04/2016  . Chronic pain of right knee 08/10/2016  . Right ankle pain 08/10/2016  . 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  . 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[place]) 12/11/2015  . Urinary retention 09/08/2015  . Lymphoma, 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[place]) 04/03/2015  . Breathlessness on exertion 11/21/2014  . Breath shortness 11/21/2014  . Arthropathy 11/07/2014  . Atrial flutter, paroxysmal ([place]) 11/07/2014  . Type 2 diabetes mellitus ([place]) 11/07/2014  . Benign essential tremor 11/07/2014  . Benign essential HTN 11/07/2014  . Mixed incontinence 11/07/2014  . Hypercholesterolemia without hypertriglyceridemia 11/07/2014  . Apnea, sleep 11/07/2014  . Controlled type 2 diabetes mellitus without complication ([place]) 35/68/6168  . Pure hypercholesterolemia 11/07/2014  . Other abnormalities of gait and mobility 11/02/2011  . Decreased mobility 11/02/2011  . Abnormal gait 06/29/2011  . Discoordination 06/29/2011   Pura Spice, PT, DPT # [id number] SPT 05/06/2020, 4:48 PM  [place] [facility] [address] [place], [place], [address] Phone: [phone]   Fax:  [phone]  Name: Mike Holloway MRN: [id number] Date of Birth: 1941/09/13

## 2020-05-07 ENCOUNTER — Ambulatory Visit (INDEPENDENT_AMBULATORY_CARE_PROVIDER_SITE_OTHER): Payer: Medicare Other | Admitting: Physician Assistant

## 2020-05-07 DIAGNOSIS — R339 Retention of urine, unspecified: Secondary | ICD-10-CM

## 2020-05-07 NOTE — Progress Notes (Signed)
Cath Change/ Replacement  Patient is present today for a catheter change due to urinary retention.  60ml of water was removed from the balloon, a 16FR coude foley cath was removed without difficulty, and the foreskin was retracted.  Patient was cleaned and prepped in a sterile fashion and 2% lidocaine jelly was instilled into the urethra. A 16 FR coude foley cath was replaced into the bladder no complications were noted Urine return was noted 61ml and urine was yellow in color. The balloon was filled with 62ml of sterile water and the foreskin was reduced. A leg bag was attached for drainage.  A night bag was also given to the patient and patient was given instruction on how to change from one bag to another. Patient was given proper instruction on catheter care.    Performed by: Debroah Loop, PA-C   Follow up: Return in about 4 weeks (around 06/04/2020) for Catheter exchange.

## 2020-05-13 ENCOUNTER — Other Ambulatory Visit: Payer: Self-pay

## 2020-05-13 ENCOUNTER — Ambulatory Visit: Payer: Medicare Other | Admitting: Physical Therapy

## 2020-05-13 ENCOUNTER — Encounter: Payer: Self-pay | Admitting: Physical Therapy

## 2020-05-13 DIAGNOSIS — R293 Abnormal posture: Secondary | ICD-10-CM

## 2020-05-13 DIAGNOSIS — M6281 Muscle weakness (generalized): Secondary | ICD-10-CM | POA: Diagnosis not present

## 2020-05-13 DIAGNOSIS — R2689 Other abnormalities of gait and mobility: Secondary | ICD-10-CM

## 2020-05-13 DIAGNOSIS — R269 Unspecified abnormalities of gait and mobility: Secondary | ICD-10-CM

## 2020-05-13 NOTE — Therapy (Signed)
[place] [facility] [address] [place], [place], [address] Phone: [phone]   Fax:  [phone]  Physical Therapy Treatment  Patient Details  Name: Mike Holloway MRN: [id number] Date of Birth: 1940/11/06 Referring Provider (PT): Dr. Rudene Christians   Encounter Date: 05/13/2020   PT End of Session - 05/13/20 1527    Visit Number 58    Number of Visits 80    Date for PT Re-Evaluation 07/01/20    Authorization - Visit Number 2    Authorization - Number of Visits 10    PT Start Time 3832    PT Stop Time 1402    PT Time Calculation (min) 66 min    Equipment Utilized During Treatment Gait belt    Activity Tolerance Patient tolerated treatment well;Patient limited by fatigue    Behavior During Therapy WFL for tasks assessed/performed           Past Medical History:  Diagnosis Date   Arthritis    Atrial flutter ([place])    Diabetes mellitus ([place])    Essential tremor    Essential tremor    deep brain stimulator    Hypercholesteremia    Hypertension    Incontinence    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[place])    grew on the testical   SIADH (syndrome of inappropriate ADH production) ([place])    Sleep apnea    Stroke [facility])     Past Surgical History:  Procedure Laterality Date   ABLATION     APPENDECTOMY     CATARACT EXTRACTION, BILATERAL     COLONOSCOPY WITH PROPOFOL N/A 03/17/2018   Procedure: COLONOSCOPY WITH PROPOFOL;  Surgeon: Jonathon Bellows, MD;  [facility] 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   Subjective Assessment - 05/13/20 1422    Subjective Pt reports he has not practiced anymore standing at the sink without BUE support. Reports walking more frequently at home with rollator.    Patient is accompained by: Family member    Pertinent History Pt. states R knee is bothering him but it is the typical discomfort he experiences    Limitations House hold activities;Walking;Standing;Lifting    Patient Stated Goals Improve LE strength/ gait and balance with daily tasks.  Improve gait/ safety/ progression to least assistive device.    Currently in Pain? No/denies    Pain Onset More than a month ago    Pain Onset More than a month ago         There.ex: After neuro re-ed  Nu-Step L4 for 10 min with LE's only. Heating pad on lumbar spine. No LBP after nu-step and hot pad on low back.   Neuro Re-ed:   Walking 2 laps around gym with rollator focusing on B equal step length and heel strike.   STS with  RUE on handrail and airex pad on seat: 1x12   Standing ball raises overhead with basketball: 1x12   B standing trunk rotations with basketball: 1x12. Reports of LBP after standing activities requiring seated rest break.   Cone stacking overhead with standing: 1x15 cones with no BUE support. Required seated rest break after 2 min.   Resisted x2BTB backwards walking: x8. Requires BUE support. Pt feels unsafe with no hands.   PT Long Term Goals - 05/06/20 1510      PT LONG TERM GOAL #1   Title Pt. independent with HEP to increase B hip flexion/ R quad muscle strength 1/2 muscle grade to improve pain-free mobility.    Baseline R knee extension limited to 4/5 MMT secondary to pain/ fear of pain. 2/9: Deferred.  4/13: 4+/5 MMT (max potential).    Time 4    Period Weeks    Status  Achieved      PT LONG TERM GOAL #2   Title Pt. will increase Berg balance test to >40 out of 56 to improve independence with gait/ decrease fall risk.    Baseline Berg: 22/56 (significant fall risk); 9/30 32/56.  11/10: 32 (limited by R knee pain)  2/9: 33/56 limited due to R knee and Low Back pain; 8/10: 29/56    Time 8    Period Weeks    Status Not Met    Target Date 07/01/20      PT LONG TERM GOAL #3   Title Pt. able to ambulate 100 feet with consistent 2 point gait pattern and use of least restrictive assistive devce to improve household mobility.    Baseline Pt. able to ambulate with use of RW/ CGA to min. A for safety.   Heavy use of B UE.; 9/30 pt able to ambulate with rollator with inconsistent 2 point gait pattern, heavy reliance on UE CGA/supervision  2/9: Pt. uses 2 point step pattern with walking with rollader with decreased stride length. Pt. would be limited due to pain in the knee walking for >100 ft.; 8/10: Pt demonstrates ability to ambulate 100' with consistent 2 point gait pattern with rollator.    Time 0    Period Weeks    Status Achieved    Target Date 05/06/20      PT LONG TERM GOAL #4   Title Pt. able to stand from normal chair with no UE assist to improve safety/independence with transfers.     Baseline Unable to stand from standard chair without heavy UE assist. 10/6, pt unable to stand from standard chair without UE support.  11/10: benefits from 1 UE assist    2/9: Pt. can rise from normal chair with 1 UE assist and must control descent with UE assist.; 8/10: Pt requires 2 UE for standing from standard chair height.    Time 8    Period Weeks    Status Partially Met    Target Date 07/01/20      PT LONG TERM GOAL #5   Title Pt. will perform a sit to stand with 1 UE support to be able to perform toileting activities with more independance.    Baseline Pt. requires 2 UE to push from to sit to stand from standard chair height. Pt. has grab rails available but can not  get up by pulling.; 8/10: pt requires 2 UE's to STS from standard chair height.    Time 8    Period Weeks    Status Partially Met  Target Date 07/01/20      Additional Long Term Goals   Additional Long Term Goals Yes      PT LONG TERM GOAL #6   Title Pt. will ascend/ descend 4 steps with use of B handrails and step pattern with mod. independence safely to be able to enter son's house.    Baseline Step pattern with heavy UE assist and CGA/min. A for safety.; 8/10: Pt safely asc/desc 4 steps with B hand rails and step to pattern with mod indep to enter son's house safely.    Time 0    Period Weeks    Status Achieved    Target Date 05/06/20      PT LONG TERM GOAL #7   Title Pt will be able to stand at bathroom sink for 5 min with no UE support to complete ADL's like brushing his teeth, washing his hands, and combing his hair.    Baseline 8/10: Ability to stand with no UE support for 2 min.    Time 8    Period Weeks    Status New    Target Date 07/01/20                 Plan - 05/13/20 1529    Clinical Impression Statement Pt limited in standing with no BUE support due to back pain, not endurance. Pt displayed ability to perform cone stacking overhead with no BUE support and no LOB or lateral sway. Needed rest break after 2 min due to c/o LBP. Pt able to perform backwards walking with resistance with x2BTB with BUE support on // bars. Pt can continue to benefit from skilled PT to improve functional mobility and further independence with standing with no UE support.    Examination-Activity Limitations Toileting;Stand;Squat;Lift;Stairs    Stability/Clinical Decision Making Evolving/Moderate complexity    Rehab Potential Fair    PT Frequency 1x / week    PT Duration 8 weeks    PT Treatment/Interventions Moist Heat;Functional mobility training;Therapeutic activities;Therapeutic exercise;Balance training;Patient/family education;Manual techniques;Passive range of motion;Neuromuscular  re-education;Stair training;Gait training;ADLs/Self Care Home Management    PT Next Visit Plan Hip strength, dynamic gait    PT Home Exercise Plan [email or web address]    Consulted and Agree with Plan of Care Patient;Family member/caregiver    Family Member Consulted spouse: [name]           Patient will benefit from skilled therapeutic intervention in order to improve the following deficits and impairments:  Abnormal gait, Decreased endurance, Decreased activity tolerance, Pain, Decreased balance, Impaired flexibility, Decreased strength, Postural dysfunction  Visit Diagnosis: Muscle weakness (generalized)  Gait difficulty  Balance problem  Abnormal posture     Problem List Patient Active Problem List   Diagnosis Date Noted   DVT femoral (deep venous thrombosis) with thrombophlebitis, left ([facility]) 02/06/2020   Lymphedema 04/15/2019   Bilateral leg edema 01/25/2019   Hyponatremia 12/24/2018   SIADH (syndrome of inappropriate ADH production) (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) 04/03/2015   Breathlessness on exertion 11/21/2014   Breath shortness 11/21/2014   Arthropathy 11/07/2014   Atrial flutter, paroxysmal ([place]) 11/07/2014   Type 2 diabetes mellitus ([place]) 11/07/2014   Benign essential tremor 11/07/2014    Benign essential HTN 11/07/2014   Mixed incontinence 11/07/2014   Hypercholesterolemia without hypertriglyceridemia 11/07/2014   Apnea, sleep 11/07/2014   Controlled type 2 diabetes mellitus without complication ([place]) 92/44/6286   Pure hypercholesterolemia 11/07/2014   Other abnormalities of gait and mobility 11/02/2011   Decreased mobility 11/02/2011   Abnormal gait 06/29/2011   Discoordination 06/29/2011   Pura Spice, PT, DPT # [id number], SPT 05/13/2020, 6:25 PM   [facility] [address] [place], [place], [address] Phone: [phone]   Fax:  [phone]  Name: Mike Holloway MRN: [id number] Date of Birth: Jun 13, 1941

## 2020-05-16 ENCOUNTER — Telehealth: Payer: Self-pay

## 2020-05-16 NOTE — Telephone Encounter (Signed)
Patient's wife left a vmail stating that he has had a lot of clots in his bag since his exchange. Urine was cranberry colored but is now yellow with some clots noted. Bag is still draining fine. She was encouraged to have him drink more water and that some blood or clots are fine but to call back if he developed thick clotted blood or if the catheter stopped draining. She verbalized understanding

## 2020-05-20 ENCOUNTER — Ambulatory Visit: Payer: Medicare Other | Admitting: Physical Therapy

## 2020-05-20 ENCOUNTER — Encounter: Payer: Self-pay | Admitting: Physical Therapy

## 2020-05-20 ENCOUNTER — Other Ambulatory Visit: Payer: Self-pay

## 2020-05-20 DIAGNOSIS — R2689 Other abnormalities of gait and mobility: Secondary | ICD-10-CM

## 2020-05-20 DIAGNOSIS — R269 Unspecified abnormalities of gait and mobility: Secondary | ICD-10-CM

## 2020-05-20 DIAGNOSIS — M6281 Muscle weakness (generalized): Secondary | ICD-10-CM | POA: Diagnosis not present

## 2020-05-20 DIAGNOSIS — R293 Abnormal posture: Secondary | ICD-10-CM

## 2020-05-20 NOTE — Therapy (Signed)
[facility] [address] [place], [place], [address] Phone: [phone]   Fax:  [phone]  Physical Therapy Treatment  Patient Details  Name: Mike Holloway MRN: [id number] Date of Birth: August 01, 1941 Referring Provider (PT): Dr. Rudene Christians   Encounter Date: 05/20/2020   PT End of Session - 05/20/20 1535    Visit Number 70    Number of Visits 80    Date for PT Re-Evaluation 07/01/20    Authorization - Visit Number 3    Authorization - Number of Visits 10    PT Start Time 4174    PT Stop Time 1451    PT Time Calculation (min) 69 min    Equipment Utilized During Treatment Gait belt    Activity Tolerance Patient tolerated treatment well;Patient limited by fatigue;No increased pain    Behavior During Therapy WFL for tasks assessed/performed           Past Medical History:  Diagnosis Date  . Arthritis   . Atrial flutter ([place])   . Diabetes mellitus ([place])   . Essential tremor   . Essential tremor    deep brain stimulator   . Hypercholesteremia   . Hypertension   . Incontinence   . 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[place])    grew on the testical  . SIADH (syndrome of inappropriate ADH production) ([place])   . Sleep apnea   . Stroke [facility])     Past Surgical History:  Procedure Laterality Date  . ABLATION    . APPENDECTOMY    . CATARACT EXTRACTION, BILATERAL    . COLONOSCOPY WITH PROPOFOL N/A 03/17/2018   Procedure: COLONOSCOPY WITH PROPOFOL;  Surgeon: Jonathon Bellows, MD;  Location: [facility] ENDOSCOPY;  Service: Gastroenterology;  Laterality: N/A;  . DEEP BRAIN STIMULATOR PLACEMENT    . 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 HEMORRHOID SURGERY    . HERNIA REPAIR    . HIP FRACTURE SURGERY    . INTRAMEDULLARY (IM) NAIL INTERTROCHANTERIC Right 09/16/2018   Procedure: INTRAMEDULLARY (IM) NAIL INTERTROCHANTRIC;  Surgeon:  Hessie Knows, MD;  Location: [facility] ORS;  Service: Orthopedics;  Laterality: Right;  . IR CATHETER TUBE CHANGE  11/22/2018  . NASAL SINUS SURGERY    . ORCHIECTOMY    . TONSILLECTOMY      There were no vitals filed for this visit.   Subjective Assessment - 05/20/20 1533    Subjective Pt states he has been walking in his home with his rollator. Pt states he attempted to stand at his counter in the kitchen to wash hands and began losing his balance posteriorly. Pt did now fall however. Pt states he was able to walk into his shower with the rollator. He typically use w/c to get into shower.    Patient is accompained by: Family member    Pertinent History Pt. states R knee is bothering him but it is the typical discomfort he experiences    Limitations House hold activities;Walking;Standing;Lifting    Patient Stated Goals Improve LE strength/ gait and balance with daily tasks.  Improve gait/ safety/ progression to least assistive device.    Currently in Pain? No/denies    Pain Onset More than a month ago    Pain Onset More than a month ago          There.ex:   Nu-Step L5 for 10 min with LE's only. Hot pack on  Low back for comfort.  Neuro Re-Ed:  Walking in // bars x4 forward, x4 backward with BUE support focusing on equal stride length for improved gait mechanics.   Side stepping in // bars to R and L: x4/direction with BUE support on // bars.   6" step ups with LLE: 1x8, BUE support on hand rails. Pain with RLE step up in R knee so discontinued.   Obstacle course: stepping over blue mat with ankle weights underneath for unstable surface --> stepping onto blue airex pad --> cone weaves around tight corners for harder navigation --> 1x5 B cone taps with feet. X2, no seated rest between bouts.   Assisted pt in walking outdoors over unstable surfaces and normalized gait with rollator.    PT Education - 05/20/20 1535    Education Details Stand at counter top to wash hands with spouse  behind him and rollator behind him with brakes locked for improved safety.    Person(s) Educated Patient    Methods Explanation    Comprehension Verbalized understanding;Returned demonstration               PT Long Term Goals - 05/06/20 1510      PT LONG TERM GOAL #1   Title Pt. independent with HEP to increase B hip flexion/ R quad muscle strength 1/2 muscle grade to improve pain-free mobility.    Baseline R knee extension limited to 4/5 MMT secondary to pain/ fear of pain. 2/9: Deferred.  4/13: 4+/5 MMT (max potential).    Time 4    Period Weeks    Status Achieved      PT LONG TERM GOAL #2   Title Pt. will increase Berg balance test to >40 out of 56 to improve independence with gait/ decrease fall risk.    Baseline Berg: 22/56 (significant fall risk); 9/30 32/56.  11/10: 32 (limited by R knee pain)  2/9: 33/56 limited due to R knee and Low Back pain; 8/10: 29/56    Time 8    Period Weeks    Status Not Met    Target Date 07/01/20      PT LONG TERM GOAL #3   Title Pt. able to ambulate 100 feet with consistent 2 point gait pattern and use of least restrictive assistive devce to improve household mobility.    Baseline Pt. able to ambulate with use of RW/ CGA to min. A for safety.   Heavy use of B UE.; 9/30 pt able to ambulate with rollator with inconsistent 2 point gait pattern, heavy reliance on UE CGA/supervision  2/9: Pt. uses 2 point step pattern with walking with rollader with decreased stride length. Pt. would be limited due to pain in the knee walking for >100 ft.; 8/10: Pt demonstrates ability to ambulate 100' with consistent 2 point gait pattern with rollator.    Time 0    Period Weeks    Status Achieved    Target Date 05/06/20      PT LONG TERM GOAL #4   Title Pt. able to stand from normal chair with no UE assist to improve safety/independence with transfers.     Baseline Unable to stand from standard chair without heavy UE assist. 10/6, pt unable to stand from standard  chair without UE support.  11/10: benefits from 1 UE assist    2/9: Pt. can rise from normal chair with 1 UE assist and must control descent with UE assist.; 8/10: Pt requires 2 UE for standing from standard chair  height.    Time 8    Period Weeks    Status Partially Met    Target Date 07/01/20      PT LONG TERM GOAL #5   Title Pt. will perform a sit to stand with 1 UE support to be able to perform toileting activities with more independance.    Baseline Pt. requires 2 UE to push from to sit to stand from standard chair height. Pt. has grab rails available but can not get up by pulling.; 8/10: pt requires 2 UE's to STS from standard chair height.    Time 8    Period Weeks    Status Partially Met    Target Date 07/01/20      Additional Long Term Goals   Additional Long Term Goals Yes      PT LONG TERM GOAL #6   Title Pt. will ascend/ descend 4 steps with use of B handrails and step pattern with mod. independence safely to be able to enter son's house.    Baseline Step pattern with heavy UE assist and CGA/min. A for safety.; 8/10: Pt safely asc/desc 4 steps with B hand rails and step to pattern with mod indep to enter son's house safely.    Time 0    Period Weeks    Status Achieved    Target Date 05/06/20      PT LONG TERM GOAL #7   Title Pt will be able to stand at bathroom sink for 5 min with no UE support to complete ADL's like brushing his teeth, washing his hands, and combing his hair.    Baseline 8/10: Ability to stand with no UE support for 2 min.    Time 8    Period Weeks    Status New    Target Date 07/01/20                 Plan - 05/20/20 1537    Clinical Impression Statement Pt demonstrated ability to perform 2 bouts of obstacle course without seated rest between the bouts. total of about 10 min of standing demonstrating improved walking tolerance with rollator. Pt still requires mod BUE support due to LE weakness and fatigue. Pt limited in 6" step ups on R knee  due to R knee pain. Pt can continue to benefit from skilled PT to improve functional mobility and indep with ADL's.    Examination-Activity Limitations Toileting;Stand;Squat;Lift;Stairs    Stability/Clinical Decision Making Evolving/Moderate complexity    Rehab Potential Fair    PT Frequency 1x / week    PT Duration 8 weeks    PT Treatment/Interventions Moist Heat;Functional mobility training;Therapeutic activities;Therapeutic exercise;Balance training;Patient/family education;Manual techniques;Passive range of motion;Neuromuscular re-education;Stair training;Gait training;ADLs/Self Care Home Management    PT Next Visit Plan Hip strength, dynamic gait    PT Home Exercise Plan [email or web address]    Consulted and Agree with Plan of Care Patient;Family member/caregiver    Family Member Consulted spouse: [name]           Patient will benefit from skilled therapeutic intervention in order to improve the following deficits and impairments:  Abnormal gait, Decreased endurance, Decreased activity tolerance, Pain, Decreased balance, Impaired flexibility, Decreased strength, Postural dysfunction  Visit Diagnosis: Muscle weakness (generalized)  Gait difficulty  Balance problem  Abnormal posture     Problem List Patient Active Problem List   Diagnosis Date Noted  . DVT femoral (deep venous thrombosis) with thrombophlebitis, left ([place]) 02/06/2020  . Lymphedema 04/15/2019  . Bilateral leg  edema 01/25/2019  . Hyponatremia 12/24/2018  . SIADH (syndrome of inappropriate ADH production) ([place]) 12/24/2018  . Erosion of urethra due to catheterization of urinary tract ([place]) 10/27/2018  . Generalized weakness 10/14/2018  . Hip fracture ([place]) 09/15/2018  . 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 HLD (hyperlipidemia) 03/24/2018  . Contusion of right knee 02/14/2018  . Bradycardia 12/28/2017  . Status post right  unicompartmental knee replacement 11/03/2017  . Tremor 09/04/2016  . Chronic pain of right knee 08/10/2016  . Right ankle pain 08/10/2016  . 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  . 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[place]) 12/11/2015  . Urinary retention 09/08/2015  . Lymphoma, 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[place]) 04/03/2015  . Breathlessness on exertion 11/21/2014  . Breath shortness 11/21/2014  . Arthropathy 11/07/2014  . Atrial flutter, paroxysmal ([place]) 11/07/2014  . Type 2 diabetes mellitus ([place]) 11/07/2014  . Benign essential tremor 11/07/2014  . Benign essential HTN 11/07/2014  . Mixed incontinence 11/07/2014  . Hypercholesterolemia without hypertriglyceridemia 11/07/2014  . Apnea, sleep 11/07/2014  . Controlled type 2 diabetes mellitus without complication ([place]) 05/39/7673  . Pure hypercholesterolemia 11/07/2014  . Other abnormalities of gait and mobility 11/02/2011  . Decreased mobility 11/02/2011  . Abnormal gait 06/29/2011  . Discoordination 06/29/2011   Pura Spice, PT, DPT # [id number] SPT 05/21/2020, 8:50 AM  [facility] [address] [place], [place], [address] Phone: [phone]   Fax:  [phone]  Name: Mike Holloway MRN: [id number] Date of Birth: [birth year]

## 2020-05-27 ENCOUNTER — Ambulatory Visit: Payer: Medicare Other | Admitting: Physical Therapy

## 2020-05-27 ENCOUNTER — Encounter: Payer: Self-pay | Admitting: Physical Therapy

## 2020-05-27 ENCOUNTER — Other Ambulatory Visit: Payer: Self-pay

## 2020-05-27 DIAGNOSIS — M6281 Muscle weakness (generalized): Secondary | ICD-10-CM | POA: Diagnosis not present

## 2020-05-27 DIAGNOSIS — R269 Unspecified abnormalities of gait and mobility: Secondary | ICD-10-CM

## 2020-05-27 DIAGNOSIS — R293 Abnormal posture: Secondary | ICD-10-CM

## 2020-05-27 DIAGNOSIS — R2689 Other abnormalities of gait and mobility: Secondary | ICD-10-CM

## 2020-05-27 NOTE — Therapy (Signed)
[facility] [address] [place], [place], [address] Phone: [phone]   Fax:  [phone]  Physical Therapy Treatment  Patient Details  Name: Mike Holloway MRN: [id number] Date of Birth: March 11, 1941 Referring Provider (PT): Dr. Rudene Christians   Encounter Date: 05/27/2020   PT End of Session - 05/27/20 1400    Visit Number 75    Number of Visits 80    Date for PT Re-Evaluation 07/01/20    Authorization - Visit Number 4    Authorization - Number of Visits 10    PT Start Time 2094    PT Stop Time 1448    PT Time Calculation (min) 61 min    Equipment Utilized During Treatment Gait belt    Activity Tolerance Patient tolerated treatment well;Patient limited by fatigue;No increased pain    Behavior During Therapy WFL for tasks assessed/performed           Past Medical History:  Diagnosis Date  . Arthritis   . Atrial flutter ([place])   . Diabetes mellitus ([place])   . Essential tremor   . Essential tremor    deep brain stimulator   . Hypercholesteremia   . Hypertension   . Incontinence   . 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[place])    grew on the testical  . SIADH (syndrome of inappropriate ADH production) ([place])   . Sleep apnea   . Stroke [facility])     Past Surgical History:  Procedure Laterality Date  . ABLATION    . APPENDECTOMY    . CATARACT EXTRACTION, BILATERAL    . COLONOSCOPY WITH PROPOFOL N/A 03/17/2018   Procedure: COLONOSCOPY WITH PROPOFOL;  Surgeon: Jonathon Bellows, MD;  Location: [facility] ENDOSCOPY;  Service: Gastroenterology;  Laterality: N/A;  . DEEP BRAIN STIMULATOR PLACEMENT    . 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 HEMORRHOID SURGERY    . HERNIA REPAIR    . HIP FRACTURE SURGERY    . INTRAMEDULLARY (IM) NAIL INTERTROCHANTERIC Right 09/16/2018   Procedure: INTRAMEDULLARY (IM) NAIL INTERTROCHANTRIC;  Surgeon:  Hessie Knows, MD;  Location: [facility] ORS;  Service: Orthopedics;  Laterality: Right;  . IR CATHETER TUBE CHANGE  11/22/2018  . NASAL SINUS SURGERY    . ORCHIECTOMY    . TONSILLECTOMY      There were no vitals filed for this visit.   Subjective Assessment - 05/27/20 1355    Subjective Pt reports 4/10 NPS pain in R knee. Pt reports standing at sink over the weekend multiple times (~5-6) to wash hands. No falls or stumbles. Still walking in the house.    Patient is accompained by: Family member    Pertinent History Pt. states R knee is bothering him but it is the typical discomfort he experiences    Limitations House hold activities;Walking;Standing;Lifting    Patient Stated Goals Improve LE strength/ gait and balance with daily tasks.  Improve gait/ safety/ progression to least assistive device.    Currently in Pain? Yes    Pain Score 4     Pain Location Knee    Pain Orientation Right    Pain Type Chronic pain    Pain Onset More than a month ago    Pain Onset More than a month ago           There.ex:   Nu-Step L5 for 10 min with UE's/LE's. Hot pack on lumbar spine.  Reviewed/ discussed seated and standing there.ex (HEP).   Neuro re-ed:   Forward/backwards walking: 8x    Lateral walking: 8x. Minimal verbal cueing to increase knee flexion throughout.    Ball tosses in // bars: Pt required CGA throughout. First bout was 2 mins 30 sec, second bout 2 mins 45 seconds, both bouts limited  by back pain. Experienced 1 LOB, able to self correct with use of // bars. Pt able to catch tosses outside BOS.    Side stepping on airex balance beam:x6         PT Long Term Goals - 05/06/20 1510      PT LONG TERM GOAL #1   Title Pt. independent with HEP to increase B hip flexion/ R quad muscle strength 1/2 muscle grade to improve pain-free mobility.    Baseline R knee extension limited to 4/5 MMT secondary to pain/ fear of pain. 2/9: Deferred.  4/13: 4+/5 MMT (max potential).    Time 4     Period Weeks    Status Achieved      PT LONG TERM GOAL #2   Title Pt. will increase Berg balance test to >40 out of 56 to improve independence with gait/ decrease fall risk.    Baseline Berg: 22/56 (significant fall risk); 9/30 32/56.  11/10: 32 (limited by R knee pain)  2/9: 33/56 limited due to R knee and Low Back pain; 8/10: 29/56    Time 8    Period Weeks    Status Not Met    Target Date 07/01/20      PT LONG TERM GOAL #3   Title Pt. able to ambulate 100 feet with consistent 2 point gait pattern and use of least restrictive assistive devce to improve household mobility.    Baseline Pt. able to ambulate with use of RW/ CGA to min. A for safety.   Heavy use of B UE.; 9/30 pt able to ambulate with rollator with inconsistent 2 point gait pattern, heavy reliance on UE CGA/supervision  2/9: Pt. uses 2 point step pattern with walking with rollader with decreased stride length. Pt. would be limited due to pain in the knee walking for >100 ft.; 8/10: Pt demonstrates ability to ambulate 100' with consistent 2 point gait pattern with rollator.    Time 0    Period Weeks    Status Achieved    Target Date 05/06/20      PT LONG TERM GOAL #4   Title Pt. able to stand from normal chair with no UE assist to improve safety/independence with transfers.     Baseline Unable to stand from standard chair without heavy UE assist. 10/6, pt unable to stand from standard chair without UE support.  11/10: benefits from 1 UE assist    2/9: Pt. can rise from normal chair with 1 UE assist and must control descent with UE assist.; 8/10: Pt requires 2 UE for standing from standard chair height.    Time 8    Period Weeks    Status Partially Met    Target Date 07/01/20      PT LONG TERM GOAL #5   Title Pt. will perform a sit to stand with 1 UE support to be able to perform toileting activities with more independance.    Baseline Pt. requires 2 UE to push from to sit to stand from standard chair height. Pt. has grab  rails available but can not get up by pulling.; 8/10: pt requires 2 UE's to STS  from standard chair height.    Time 8    Period Weeks    Status Partially Met    Target Date 07/01/20      Additional Long Term Goals   Additional Long Term Goals Yes      PT LONG TERM GOAL #6   Title Pt. will ascend/ descend 4 steps with use of B handrails and step pattern with mod. independence safely to be able to enter son's house.    Baseline Step pattern with heavy UE assist and CGA/min. A for safety.; 8/10: Pt safely asc/desc 4 steps with B hand rails and step to pattern with mod indep to enter son's house safely.    Time 0    Period Weeks    Status Achieved    Target Date 05/06/20      PT LONG TERM GOAL #7   Title Pt will be able to stand at bathroom sink for 5 min with no UE support to complete ADL's like brushing his teeth, washing his hands, and combing his hair.    Baseline 8/10: Ability to stand with no UE support for 2 min.    Time 8    Period Weeks    Status New    Target Date 07/01/20              Plan - 05/27/20 1428    Clinical Impression Statement Pt. continues to demonstrate improved standing balance and balance recovery with standing ball tosses and lateral walking on airex balance beam. Pt. still defaults to using UE support to correct balance, however is able to stand and complete activities with mild-mod verbal cueing to decrease UE support. Pt. will continue to benefit from skilled PT to improve functional mobility and independence with ADLs.    Examination-Activity Limitations Toileting;Stand;Squat;Lift;Stairs    Stability/Clinical Decision Making Evolving/Moderate complexity    Clinical Decision Making Moderate    Rehab Potential Fair    PT Frequency 1x / week    PT Duration 8 weeks    PT Treatment/Interventions Moist Heat;Functional mobility training;Therapeutic activities;Therapeutic exercise;Balance training;Patient/family education;Manual techniques;Passive range of  motion;Neuromuscular re-education;Stair training;Gait training;ADLs/Self Care Home Management    PT Next Visit Plan Hip strength, dynamic gait    PT Home Exercise Plan [email or web address]    Consulted and Agree with Plan of Care Patient;Family member/caregiver    Family Member Consulted spouse: [name]           Patient will benefit from skilled therapeutic intervention in order to improve the following deficits and impairments:  Abnormal gait, Decreased endurance, Decreased activity tolerance, Pain, Decreased balance, Impaired flexibility, Decreased strength, Postural dysfunction  Visit Diagnosis: Muscle weakness (generalized)  Gait difficulty  Abnormal posture  Balance problem     Problem List Patient Active Problem List   Diagnosis Date Noted  . DVT femoral (deep venous thrombosis) with thrombophlebitis, left ([place]) 02/06/2020  . Lymphedema 04/15/2019  . Bilateral leg edema 01/25/2019  . Hyponatremia 12/24/2018  . SIADH (syndrome of inappropriate ADH production) ([place]) 12/24/2018  . Erosion of urethra due to catheterization of urinary tract ([place]) 10/27/2018  . Generalized weakness 10/14/2018  . Hip fracture ([place]) 09/15/2018  . 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 HLD (hyperlipidemia) 03/24/2018  . Contusion of right knee 02/14/2018  . Bradycardia 12/28/2017  . 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 Tremor 09/04/2016  . Chronic pain of right knee 08/10/2016  . Right ankle  pain 08/10/2016  . 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  . 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[place]) 12/11/2015  . Urinary retention 09/08/2015  . Lymphoma, 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[place]) 04/03/2015  . Breathlessness on exertion 11/21/2014  . Breath shortness 11/21/2014  . Arthropathy 11/07/2014  . Atrial flutter, paroxysmal ([place]) 11/07/2014  . Type 2 diabetes mellitus ([place]) 11/07/2014  . Benign essential  tremor 11/07/2014  . Benign essential HTN 11/07/2014  . Mixed incontinence 11/07/2014  . Hypercholesterolemia without hypertriglyceridemia 11/07/2014  . Apnea, sleep 11/07/2014  . Controlled type 2 diabetes mellitus without complication ([place]) 25/49/8264  . Pure hypercholesterolemia 11/07/2014  . Other abnormalities of gait and mobility 11/02/2011  . Decreased mobility 11/02/2011  . Abnormal gait 06/29/2011  . Discoordination 06/29/2011   Pura Spice, PT, DPT # [phone] Rachael Fee, SPT 05/27/2020, 4:05 PM  [facility] [address] [place], [place], [address] Phone: [phone]   Fax:  [phone]  Name: Mike Holloway MRN: [id number] Date of Birth: 04-Dec-1940

## 2020-06-03 ENCOUNTER — Other Ambulatory Visit: Payer: Self-pay

## 2020-06-03 ENCOUNTER — Encounter: Payer: Self-pay | Admitting: Physical Therapy

## 2020-06-03 ENCOUNTER — Ambulatory Visit: Payer: Medicare Other | Attending: Orthopedic Surgery

## 2020-06-03 DIAGNOSIS — M6281 Muscle weakness (generalized): Secondary | ICD-10-CM

## 2020-06-03 DIAGNOSIS — R293 Abnormal posture: Secondary | ICD-10-CM | POA: Diagnosis present

## 2020-06-03 DIAGNOSIS — R2689 Other abnormalities of gait and mobility: Secondary | ICD-10-CM

## 2020-06-03 DIAGNOSIS — R269 Unspecified abnormalities of gait and mobility: Secondary | ICD-10-CM | POA: Diagnosis present

## 2020-06-03 NOTE — Therapy (Signed)
[facility] [address] [place], [place], [address] Phone: [phone]   Fax:  [phone]  Physical Therapy Treatment  Patient Details  Name: Mike Holloway MRN: [id number] Date of Birth: Jan 07, 1941 Referring Provider (PT): Dr. Rudene Christians   Encounter Date: 06/03/2020   PT End of Session - 06/03/20 1518    Visit Number 63    Number of Visits 80    Date for PT Re-Evaluation 07/01/20    Authorization - Visit Number 5    Authorization - Number of Visits 10    PT Start Time 0355    PT Stop Time 9741    PT Time Calculation (min) 59 min    Equipment Utilized During Treatment Gait belt    Activity Tolerance Patient tolerated treatment well;Patient limited by fatigue;No increased pain    Behavior During Therapy WFL for tasks assessed/performed           Past Medical History:  Diagnosis Date  . Arthritis   . Atrial flutter ([place])   . Diabetes mellitus ([place])   . Essential tremor   . Essential tremor    deep brain stimulator   . Hypercholesteremia   . Hypertension   . Incontinence   . 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[place])    grew on the testical  . SIADH (syndrome of inappropriate ADH production) ([place])   . Sleep apnea   . Stroke [facility])     Past Surgical History:  Procedure Laterality Date  . ABLATION    . APPENDECTOMY    . CATARACT EXTRACTION, BILATERAL    . COLONOSCOPY WITH PROPOFOL N/A 03/17/2018   Procedure: COLONOSCOPY WITH PROPOFOL;  Surgeon: Jonathon Bellows, MD;  Location: [facility] ENDOSCOPY;  Service: Gastroenterology;  Laterality: N/A;  . DEEP BRAIN STIMULATOR PLACEMENT    . 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 HEMORRHOID SURGERY    . HERNIA REPAIR    . HIP FRACTURE SURGERY    . INTRAMEDULLARY (IM) NAIL INTERTROCHANTERIC Right 09/16/2018   Procedure: INTRAMEDULLARY (IM) NAIL INTERTROCHANTRIC;  Surgeon: Hessie Knows, MD;  Location: [facility] ORS;  Service: Orthopedics;  Laterality: Right;  . IR CATHETER TUBE CHANGE  11/22/2018  . NASAL SINUS SURGERY    . ORCHIECTOMY    . TONSILLECTOMY      There were no vitals filed for this visit.   Subjective Assessment - 06/03/20 1427    Subjective Pt reports ambualting every hour at home performing 2 laps around the house. No falls. Continues to wash hands at counter without holding onto rollator once a day.    Patient is accompained by: Family member    Pertinent History Pt. states R knee is bothering him but it is the typical discomfort he experiences    Limitations House hold activities;Walking;Standing;Lifting    Patient Stated Goals Improve LE strength/ gait and balance with daily tasks.  Improve gait/ safety/ progression to least assistive device.    Currently in Pain? No/denies    Pain Score 0-No pain    Pain Onset More than a month ago    Multiple Pain Sites No    Pain Onset More than a month ago          There.ex:   STS with single UE support and airex pad on seat: 2x10. Use of mirror as visual cue for form/technique.   Nu-Step L5 for 10 min. UE/LE use.  Neuro Re-Ed:   3" step ups (x2) walking in // bars: x4. Supervision.  3" lat step  (x2) walking in // bars: x4. Verbal cue for form/technique. CGA+1  Obstacle course: cone taps (2 stacked high) --> walking over unstable surfaces (4 ankle weights under blue mat) --> cone weaves --> side stepping on long airex pad and use of // bars: x1. Fatigue after one completion of course. CGA+1    PT Long Term Goals - 05/06/20 1510      PT LONG TERM GOAL #1   Title Pt. independent with HEP to increase B hip flexion/ R quad muscle strength 1/2 muscle grade to improve pain-free mobility.    Baseline R knee extension limited to 4/5 MMT secondary to pain/ fear of pain. 2/9: Deferred.  4/13: 4+/5 MMT (max potential).    Time 4    Period Weeks    Status Achieved      PT LONG TERM GOAL #2   Title Pt. will  increase Berg balance test to >40 out of 56 to improve independence with gait/ decrease fall risk.    Baseline Berg: 22/56 (significant fall risk); 9/30 32/56.  11/10: 32 (limited by R knee pain)  2/9: 33/56 limited due to R knee and Low Back pain; 8/10: 29/56    Time 8    Period Weeks    Status Not Met    Target Date 07/01/20      PT LONG TERM GOAL #3   Title Pt. able to ambulate 100 feet with consistent 2 point gait pattern and use of least restrictive assistive devce to improve household mobility.    Baseline Pt. able to ambulate with use of RW/ CGA to min. A for safety.   Heavy use of B UE.; 9/30 pt able to ambulate with rollator with inconsistent 2 point gait pattern, heavy reliance on UE CGA/supervision  2/9: Pt. uses 2 point step pattern with walking with rollader with decreased stride length. Pt. would be limited due to pain in the knee walking for >100 ft.; 8/10: Pt demonstrates ability to ambulate 100' with consistent 2 point gait pattern with rollator.    Time 0    Period Weeks    Status Achieved    Target Date 05/06/20      PT LONG TERM GOAL #4   Title Pt. able to stand from normal chair with no UE assist to improve safety/independence with transfers.     Baseline Unable to stand from standard chair without heavy UE assist. 10/6, pt unable to stand from standard chair without UE support.  11/10: benefits from 1 UE assist    2/9: Pt. can rise from normal chair with 1 UE assist and must control descent with UE assist.; 8/10: Pt requires 2 UE for standing from standard chair height.    Time 8    Period Weeks    Status Partially Met    Target Date 07/01/20      PT LONG TERM GOAL #5   Title Pt. will perform a sit to stand with 1 UE support to be able to perform toileting activities with more independance.    Baseline Pt. requires 2 UE to push from to sit to stand from standard chair height. Pt. has grab rails available but can not get up by pulling.; 8/10: pt requires 2 UE's to STS  from standard chair height.    Time 8    Period Weeks    Status Partially Met  Target Date 07/01/20      Additional Long Term Goals   Additional Long Term Goals Yes      PT LONG TERM GOAL #6   Title Pt. will ascend/ descend 4 steps with use of B handrails and step pattern with mod. independence safely to be able to enter son's house.    Baseline Step pattern with heavy UE assist and CGA/min. A for safety.; 8/10: Pt safely asc/desc 4 steps with B hand rails and step to pattern with mod indep to enter son's house safely.    Time 0    Period Weeks    Status Achieved    Target Date 05/06/20      PT LONG TERM GOAL #7   Title Pt will be able to stand at bathroom sink for 5 min with no UE support to complete ADL's like brushing his teeth, washing his hands, and combing his hair.    Baseline 8/10: Ability to stand with no UE support for 2 min.    Time 8    Period Weeks    Status New    Target Date 07/01/20                 Plan - 06/03/20 1520    Clinical Impression Statement Pt improving standing and walking tolerance with no knee pain and back pain. Pt able to perform lateral step ups on 3" step and STS's with single UE support with no LOB. Pt continue to require BUE support on // bars. Pt progressing in obstacle course ambulating over unstable surfaces with rollator and weaving around cones with tight corners with no LOB but extra time to complete increasing standing tolerance. Pt can continue to benefit from skilled PT treatment to improve functional mobility.    Examination-Activity Limitations Toileting;Stand;Squat;Lift;Stairs    Stability/Clinical Decision Making Evolving/Moderate complexity    Rehab Potential Fair    PT Frequency 1x / week    PT Duration 8 weeks    PT Treatment/Interventions Moist Heat;Functional mobility training;Therapeutic activities;Therapeutic exercise;Balance training;Patient/family education;Manual techniques;Passive range of motion;Neuromuscular  re-education;Stair training;Gait training;ADLs/Self Care Home Management    PT Next Visit Plan Hip strength, dynamic gait    PT Home Exercise Plan [email or web address]    Consulted and Agree with Plan of Care Patient;Family member/caregiver    Family Member Consulted spouse: [name]           Patient will benefit from skilled therapeutic intervention in order to improve the following deficits and impairments:  Abnormal gait, Decreased endurance, Decreased activity tolerance, Pain, Decreased balance, Impaired flexibility, Decreased strength, Postural dysfunction  Visit Diagnosis: Muscle weakness (generalized)  Gait difficulty  Abnormal posture  Balance problem     Problem List Patient Active Problem List   Diagnosis Date Noted  . DVT femoral (deep venous thrombosis) with thrombophlebitis, left ([place]) 02/06/2020  . Lymphedema 04/15/2019  . Bilateral leg edema 01/25/2019  . Hyponatremia 12/24/2018  . SIADH (syndrome of inappropriate ADH production) ([place]) 12/24/2018  . Erosion of urethra due to catheterization of urinary tract ([place]) 10/27/2018  . Generalized weakness 10/14/2018  . Hip fracture ([place]) 09/15/2018  . 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 HLD (hyperlipidemia) 03/24/2018  . Contusion of right knee 02/14/2018  . Bradycardia 12/28/2017  . 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 Tremor 09/04/2016  . Chronic pain of right knee 08/10/2016  . Right ankle pain 08/10/2016  . Chronic venous insufficiency  05/13/2016  . 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[place]) 12/11/2015  . Urinary retention 09/08/2015  . Lymphoma, 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[place]) 04/03/2015  . Breathlessness on exertion 11/21/2014  . Breath shortness 11/21/2014  . Arthropathy 11/07/2014  . Atrial flutter, paroxysmal ([place]) 11/07/2014  . Type 2 diabetes mellitus ([place]) 11/07/2014  . Benign essential tremor 11/07/2014  .  Benign essential HTN 11/07/2014  . Mixed incontinence 11/07/2014  . Hypercholesterolemia without hypertriglyceridemia 11/07/2014  . Apnea, sleep 11/07/2014  . Controlled type 2 diabetes mellitus without complication ([place]) 10/18/2409  . Pure hypercholesterolemia 11/07/2014  . Other abnormalities of gait and mobility 11/02/2011  . Decreased mobility 11/02/2011  . Abnormal gait 06/29/2011  . Discoordination 06/29/2011    Larna Daughters, SPT 06/03/2020, 3:24 PM  [facility] [address] [place], [place], [address] Phone: [phone]   Fax:  [phone]  Name: Javanni Maring MRN: [id number] Date of Birth: October 15, 1940

## 2020-06-10 ENCOUNTER — Encounter: Payer: Self-pay | Admitting: Physical Therapy

## 2020-06-10 ENCOUNTER — Ambulatory Visit: Payer: Medicare Other

## 2020-06-10 ENCOUNTER — Ambulatory Visit (INDEPENDENT_AMBULATORY_CARE_PROVIDER_SITE_OTHER): Payer: Medicare Other | Admitting: Physician Assistant

## 2020-06-10 ENCOUNTER — Encounter: Payer: Self-pay | Admitting: Physician Assistant

## 2020-06-10 ENCOUNTER — Other Ambulatory Visit: Payer: Self-pay

## 2020-06-10 VITALS — BP 138/80 | HR 70 | Ht 68.0 in | Wt 217.0 lb

## 2020-06-10 DIAGNOSIS — M6281 Muscle weakness (generalized): Secondary | ICD-10-CM

## 2020-06-10 DIAGNOSIS — R269 Unspecified abnormalities of gait and mobility: Secondary | ICD-10-CM

## 2020-06-10 DIAGNOSIS — R339 Retention of urine, unspecified: Secondary | ICD-10-CM | POA: Diagnosis not present

## 2020-06-10 DIAGNOSIS — R2689 Other abnormalities of gait and mobility: Secondary | ICD-10-CM

## 2020-06-10 DIAGNOSIS — R293 Abnormal posture: Secondary | ICD-10-CM

## 2020-06-10 NOTE — Therapy (Signed)
[place] [facility] [address] [place], [place], [address] Phone: [phone]   Fax:  [phone]  Physical Therapy Treatment  Patient Details  Name: Mike Holloway MRN: [id number] Date of Birth: Jul 15, 1941 Referring Provider (PT): Dr. Rudene Christians   Encounter Date: 06/10/2020   PT End of Session - 06/10/20 1408    Visit Number 48    Number of Visits 80    Date for PT Re-Evaluation 07/01/20    Authorization - Visit Number 6    Authorization - Number of Visits 10    PT Start Time 9233    Equipment Utilized During Treatment Gait belt    Activity Tolerance Patient tolerated treatment well;Patient limited by fatigue;No increased pain    Behavior During Therapy WFL for tasks assessed/performed           Past Medical History:  Diagnosis Date   Arthritis    Atrial flutter ([place])    Diabetes mellitus ([place])    Essential tremor    Essential tremor    deep brain stimulator    Hypercholesteremia    Hypertension    Incontinence    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[place])    grew on the testical   SIADH (syndrome of inappropriate ADH production) ([place])    Sleep apnea    Stroke [facility])     Past Surgical History:  Procedure Laterality Date   ABLATION     APPENDECTOMY     CATARACT EXTRACTION, BILATERAL     COLONOSCOPY WITH PROPOFOL N/A 03/17/2018   Procedure: COLONOSCOPY WITH PROPOFOL;  Surgeon: Jonathon Bellows, MD;  Location: [facility] 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   Procedure: FECAL TRANSPLANT;  Surgeon: Jonathon Bellows, MD;  Location: [facility] ENDOSCOPY;  Service: Gastroenterology;  Laterality: N/A;   HEMORRHOID SURGERY     HERNIA REPAIR     HIP FRACTURE SURGERY     INTRAMEDULLARY (IM) NAIL INTERTROCHANTERIC Right 09/16/2018   Procedure: INTRAMEDULLARY (IM) NAIL INTERTROCHANTRIC;  Surgeon: Hessie Knows, MD;  Location: [facility] ORS;  Service: Orthopedics;   Laterality: Right;   IR CATHETER TUBE CHANGE  11/22/2018   NASAL SINUS SURGERY     ORCHIECTOMY     TONSILLECTOMY      There were no vitals filed for this visit.   Subjective Assessment - 06/10/20 1406    Subjective Pt. reports that he has been able to practice more standing at home, additonally reports ability to complete handwashing without any LOB. Pt. reports no pain and no falls at home.    Patient is accompained by: Family member    Pertinent History Pt. states R knee is bothering him but it is the typical discomfort he experiences    Limitations House hold activities;Walking;Standing;Lifting    Patient Stated Goals Improve LE strength/ gait and balance with daily tasks.  Improve gait/ safety/ progression to least assistive device.    Currently in Pain? No/denies    Pain Onset More than a month ago    Pain Onset More than a month ago           Neuro Re-ed:  Cone reaches outside BOS standing by // bars: pt required CGA throughout. 2x15 cone reaches. Pt experienced no LOB. Pt standing for approx 2.5 mins for each set.   Standing at silver shelving unit unthreading black tubing overhead:  Pt able to stand for approx 4:45 mins. Pt required CGA throughout, and pt  typically had at least one hand on shelving unit for balance.   Handwashing practice: Pt with chair+airex pad behind and CGA provided by therapist, not required.     There Ex:  Nustep LE only L5 with hot pack on low back: 67mns   STS practice with only one hand from airex pad: 2x8 reps, 1x5 from standard height char with bilat UE support. Unable to complete single UE therefore completed with use of airex pad in chair.     PT Long Term Goals - 05/06/20 1510      PT LONG TERM GOAL #1   Title Pt. independent with HEP to increase B hip flexion/ R quad muscle strength 1/2 muscle grade to improve pain-free mobility.    Baseline R knee extension limited to 4/5 MMT secondary to pain/ fear of pain. 2/9: Deferred.   4/13: 4+/5 MMT (max potential).    Time 4    Period Weeks    Status Achieved      PT LONG TERM GOAL #2   Title Pt. will increase Berg balance test to >40 out of 56 to improve independence with gait/ decrease fall risk.    Baseline Berg: 22/56 (significant fall risk); 9/30 32/56.  11/10: 32 (limited by R knee pain)  2/9: 33/56 limited due to R knee and Low Back pain; 8/10: 29/56    Time 8    Period Weeks    Status Not Met    Target Date 07/01/20      PT LONG TERM GOAL #3   Title Pt. able to ambulate 100 feet with consistent 2 point gait pattern and use of least restrictive assistive devce to improve household mobility.    Baseline Pt. able to ambulate with use of RW/ CGA to min. A for safety.   Heavy use of B UE.; 9/30 pt able to ambulate with rollator with inconsistent 2 point gait pattern, heavy reliance on UE CGA/supervision  2/9: Pt. uses 2 point step pattern with walking with rollader with decreased stride length. Pt. would be limited due to pain in the knee walking for >100 ft.; 8/10: Pt demonstrates ability to ambulate 100' with consistent 2 point gait pattern with rollator.    Time 0    Period Weeks    Status Achieved    Target Date 05/06/20      PT LONG TERM GOAL #4   Title Pt. able to stand from normal chair with no UE assist to improve safety/independence with transfers.     Baseline Unable to stand from standard chair without heavy UE assist. 10/6, pt unable to stand from standard chair without UE support.  11/10: benefits from 1 UE assist    2/9: Pt. can rise from normal chair with 1 UE assist and must control descent with UE assist.; 8/10: Pt requires 2 UE for standing from standard chair height.    Time 8    Period Weeks    Status Partially Met    Target Date 07/01/20      PT LONG TERM GOAL #5   Title Pt. will perform a sit to stand with 1 UE support to be able to perform toileting activities with more independance.    Baseline Pt. requires 2 UE to push from to sit to  stand from standard chair height. Pt. has grab rails available but can not get up by pulling.; 8/10: pt requires 2 UE's to STS from standard chair height.    Time 8    Period  Weeks    Status Partially Met    Target Date 07/01/20      Additional Long Term Goals   Additional Long Term Goals Yes      PT LONG TERM GOAL #6   Title Pt. will ascend/ descend 4 steps with use of B handrails and step pattern with mod. independence safely to be able to enter son's house.    Baseline Step pattern with heavy UE assist and CGA/min. A for safety.; 8/10: Pt safely asc/desc 4 steps with B hand rails and step to pattern with mod indep to enter son's house safely.    Time 0    Period Weeks    Status Achieved    Target Date 05/06/20      PT LONG TERM GOAL #7   Title Pt will be able to stand at bathroom sink for 5 min with no UE support to complete ADL's like brushing his teeth, washing his hands, and combing his hair.    Baseline 8/10: Ability to stand with no UE support for 2 min.    Time 8    Period Weeks    Status New    Target Date 07/01/20                  Patient will benefit from skilled therapeutic intervention in order to improve the following deficits and impairments:     Visit Diagnosis: Muscle weakness (generalized)  Gait difficulty  Abnormal posture  Balance problem     Problem List Patient Active Problem List   Diagnosis Date Noted   DVT femoral (deep venous thrombosis) with thrombophlebitis, left ([facility]) 02/06/2020   Lymphedema 04/15/2019   Bilateral leg edema 01/25/2019   Hyponatremia 12/24/2018   SIADH (syndrome of inappropriate ADH production) ([place]) 12/24/2018   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) 10/27/2018   Generalized weakness 10/14/2018   Hip fracture ([place]) 09/15/2018   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   HLD  (hyperlipidemia) 03/24/2018   Contusion of right knee 02/14/2018   Bradycardia 12/28/2017   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   Tremor 09/04/2016   Chronic pain of right knee 08/10/2016   Right ankle pain 08/10/2016   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[place]) 12/11/2015   Urinary retention 09/08/2015   Lymphoma, 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[place]) 04/03/2015   Breathlessness on exertion 11/21/2014   Breath shortness 11/21/2014   Arthropathy 11/07/2014   Atrial flutter, paroxysmal ([place]) 11/07/2014   Type 2 diabetes mellitus ([place]) 11/07/2014   Benign essential tremor 11/07/2014   Benign essential HTN 11/07/2014   Mixed incontinence 11/07/2014   Hypercholesterolemia without hypertriglyceridemia 11/07/2014   Apnea, sleep 11/07/2014   Controlled type 2 diabetes mellitus without complication ([place]) 02/54/2706   Pure hypercholesterolemia 11/07/2014   Other abnormalities of gait and mobility 11/02/2011   Decreased mobility 11/02/2011   Abnormal gait 06/29/2011   Discoordination 06/29/2011    Larna Daughters, SPT 06/10/2020, 2:08 PM  [place] [facility] [address] [place], [place], [address] Phone: [phone]   Fax:  [phone]  Name: Mike Holloway MRN: [id number] Date of Birth: 11/19/1940

## 2020-06-10 NOTE — Progress Notes (Signed)
Cath Change/ Replacement   Patient is present today for a catheter change due to urinary retention.  54ml of water was removed from the balloon, a 16FR coude foley cath was removed without difficulty.  Patient was cleaned and prepped in a sterile fashion with betadine and 2% lidocaine jelly was instilled into the urethra. A 16 FR coude foley cath was replaced into the bladder no complications were noted Urine return was noted 52ml and urine was yellow in color. The balloon was filled with 31ml of sterile water. A leg bag was attached for drainage.  A night bag was also given to the patient and patient was given instruction on how to change from one bag to another. Patient was given proper instruction on catheter care.    Performed by: Debroah Loop, PA-C   Follow up: Return in about 4 weeks (around 07/08/2020) for Catheter exchange.   Additional notes: Patient saw Dr. Terance Hart at [facility] on 05/28/2020; planning for SPT placement in the OR, however procedure deferred for now due to COVID limitations on elective procedures. Will continue plans for monthly catheter exchange in clinic.

## 2020-06-17 ENCOUNTER — Ambulatory Visit: Payer: Medicare Other | Admitting: Physical Therapy

## 2020-06-17 ENCOUNTER — Encounter: Payer: Self-pay | Admitting: Physical Therapy

## 2020-06-17 ENCOUNTER — Other Ambulatory Visit: Payer: Self-pay

## 2020-06-17 DIAGNOSIS — M6281 Muscle weakness (generalized): Secondary | ICD-10-CM

## 2020-06-17 DIAGNOSIS — R293 Abnormal posture: Secondary | ICD-10-CM

## 2020-06-17 DIAGNOSIS — R2689 Other abnormalities of gait and mobility: Secondary | ICD-10-CM

## 2020-06-17 DIAGNOSIS — R269 Unspecified abnormalities of gait and mobility: Secondary | ICD-10-CM

## 2020-06-17 NOTE — Therapy (Signed)
[place] [facility] [address]. [place], [place], [address] Phone: [phone]   Fax:  [phone]  Physical Therapy Treatment  Patient Details  Name: Mike Holloway MRN: [id number] Date of Birth: 11-Mar-1941 Referring Provider (PT): Dr. Rudene Christians   Encounter Date: 06/17/2020   PT End of Session - 06/17/20 1408    Visit Number 70    Number of Visits 80    Date for PT Re-Evaluation 07/01/20    Authorization - Visit Number 7    Authorization - Number of Visits 10    PT Start Time 1344    PT Stop Time 1450    PT Time Calculation (min) 66 min    Equipment Utilized During Treatment Gait belt    Activity Tolerance Patient tolerated treatment well;Patient limited by fatigue;No increased pain    Behavior During Therapy WFL for tasks assessed/performed           Past Medical History:  Diagnosis Date  . Arthritis   . Atrial flutter ([place])   . Diabetes mellitus ([place])   . Essential tremor   . Essential tremor    deep brain stimulator   . Hypercholesteremia   . Hypertension   . Incontinence   . 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[place])    grew on the testical  . SIADH (syndrome of inappropriate ADH production) ([place])   . Sleep apnea   . Stroke [facility])     Past Surgical History:  Procedure Laterality Date  . ABLATION    . APPENDECTOMY    . CATARACT EXTRACTION, BILATERAL    . COLONOSCOPY WITH PROPOFOL N/A 03/17/2018   Procedure: COLONOSCOPY WITH PROPOFOL;  Surgeon: Jonathon Bellows, MD;  Location: [facility] ENDOSCOPY;  Service: Gastroenterology;  Laterality: N/A;  . DEEP BRAIN STIMULATOR PLACEMENT    . 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 HEMORRHOID SURGERY    . HERNIA REPAIR    . HIP FRACTURE SURGERY    . INTRAMEDULLARY (IM) NAIL INTERTROCHANTERIC Right 09/16/2018   Procedure: INTRAMEDULLARY (IM) NAIL INTERTROCHANTRIC;  Surgeon:  Hessie Knows, MD;  Location: [facility] ORS;  Service: Orthopedics;  Laterality: Right;  . IR CATHETER TUBE CHANGE  11/22/2018  . NASAL SINUS SURGERY    . ORCHIECTOMY    . TONSILLECTOMY      There were no vitals filed for this visit.   Subjective Assessment - 06/17/20 1405    Subjective Pt states no falls since last session. Pt reports standing and walking at home intermittently. Normal levels of R knee pain and LBP.    Patient is accompained by: Family member    Pertinent History Pt. states R knee is bothering him but it is the typical discomfort he experiences    Limitations House hold activities;Walking;Standing;Lifting    Patient Stated Goals Improve LE strength/ gait and balance with daily tasks.  Improve gait/ safety/ progression to least assistive device.    Currently in Pain? Yes    Pain Score 1     Pain Location Knee    Pain Orientation Right    Pain Descriptors / Indicators Aching;Discomfort    Pain Type Chronic pain    Pain Onset More than a month ago    Pain Frequency Intermittent    Multiple Pain Sites Yes    Pain Score 3    Pain Location Back    Pain Orientation Right;Left;Lower    Pain  Descriptors / Indicators Aching    Pain Type Chronic pain    Pain Onset More than a month ago         There.ex:   Nu-Step L5 for 10 min LE's only. Hot pack on back for reducing LBP (Not billed).    Neuro Re-Ed:    Walking in // bars x4 forwards and x4 backwards and x4 R and L with verbal cues to reduce UE support on bars.   Walking in // bars x4 forward, backward, R and L with 3 lbs ankle weights focusing on BLE strength with proper gait mechanics   Standing overhead weaving and unweaving tubing to challenge balance and standing tolerance along with fine motor tasks with LUE due to tremor. X2. One at 4 min then one at 3 min 25 sec bout. Seated rest breaks in between. CGA+1  Standing on airex pad with ball tosses outside BOS with CGA+1 and MinA+1 for intermittent LOB to correct. 3 min      Multiple, prolonged seated rest breaks in between exercises due to fatigue.      PT Education - 06/17/20 1408    Education Details form/technique with exercise.    Person(s) Educated Patient    Methods Explanation    Comprehension Verbalized understanding;Returned demonstration               PT Long Term Goals - 05/06/20 1510      PT LONG TERM GOAL #1   Title Pt. independent with HEP to increase B hip flexion/ R quad muscle strength 1/2 muscle grade to improve pain-free mobility.    Baseline R knee extension limited to 4/5 MMT secondary to pain/ fear of pain. 2/9: Deferred.  4/13: 4+/5 MMT (max potential).    Time 4    Period Weeks    Status Achieved      PT LONG TERM GOAL #2   Title Pt. will increase Berg balance test to >40 out of 56 to improve independence with gait/ decrease fall risk.    Baseline Berg: 22/56 (significant fall risk); 9/30 32/56.  11/10: 32 (limited by R knee pain)  2/9: 33/56 limited due to R knee and Low Back pain; 8/10: 29/56    Time 8    Period Weeks    Status Not Met    Target Date 07/01/20      PT LONG TERM GOAL #3   Title Pt. able to ambulate 100 feet with consistent 2 point gait pattern and use of least restrictive assistive devce to improve household mobility.    Baseline Pt. able to ambulate with use of RW/ CGA to min. A for safety.   Heavy use of B UE.; 9/30 pt able to ambulate with rollator with inconsistent 2 point gait pattern, heavy reliance on UE CGA/supervision  2/9: Pt. uses 2 point step pattern with walking with rollader with decreased stride length. Pt. would be limited due to pain in the knee walking for >100 ft.; 8/10: Pt demonstrates ability to ambulate 100' with consistent 2 point gait pattern with rollator.    Time 0    Period Weeks    Status Achieved    Target Date 05/06/20      PT LONG TERM GOAL #4   Title Pt. able to stand from normal chair with no UE assist to improve safety/independence with transfers.     Baseline  Unable to stand from standard chair without heavy UE assist. 10/6, pt unable to stand from standard chair without  UE support.  11/10: benefits from 1 UE assist    2/9: Pt. can rise from normal chair with 1 UE assist and must control descent with UE assist.; 8/10: Pt requires 2 UE for standing from standard chair height.    Time 8    Period Weeks    Status Partially Met    Target Date 07/01/20      PT LONG TERM GOAL #5   Title Pt. will perform a sit to stand with 1 UE support to be able to perform toileting activities with more independance.    Baseline Pt. requires 2 UE to push from to sit to stand from standard chair height. Pt. has grab rails available but can not get up by pulling.; 8/10: pt requires 2 UE's to STS from standard chair height.    Time 8    Period Weeks    Status Partially Met    Target Date 07/01/20      Additional Long Term Goals   Additional Long Term Goals Yes      PT LONG TERM GOAL #6   Title Pt. will ascend/ descend 4 steps with use of B handrails and step pattern with mod. independence safely to be able to enter son's house.    Baseline Step pattern with heavy UE assist and CGA/min. A for safety.; 8/10: Pt safely asc/desc 4 steps with B hand rails and step to pattern with mod indep to enter son's house safely.    Time 0    Period Weeks    Status Achieved    Target Date 05/06/20      PT LONG TERM GOAL #7   Title Pt will be able to stand at bathroom sink for 5 min with no UE support to complete ADL's like brushing his teeth, washing his hands, and combing his hair.    Baseline 8/10: Ability to stand with no UE support for 2 min.    Time 8    Period Weeks    Status New    Target Date 07/01/20                 Plan - 06/17/20 1458    Clinical Impression Statement Pt demonstrated ability to tolerate standing performing overhead task for 3 min 45 sec and for 4 min before requesting seated rest break. Pt required intermittent minA+1 for ball catching outside  BOS on airex pad to correct LOB. Pt can continue to benefit from skilled PT treatment to improve functional mobility.    Examination-Activity Limitations Toileting;Stand;Squat;Lift;Stairs    Stability/Clinical Decision Making Evolving/Moderate complexity    Clinical Decision Making Moderate    Rehab Potential Fair    PT Frequency 1x / week    PT Duration 8 weeks    PT Treatment/Interventions Moist Heat;Functional mobility training;Therapeutic activities;Therapeutic exercise;Balance training;Patient/family education;Manual techniques;Passive range of motion;Neuromuscular re-education;Stair training;Gait training;ADLs/Self Care Home Management    PT Next Visit Plan Hip strength, dynamic gait    PT Home Exercise Plan [email or web address]    Consulted and Agree with Plan of Care Patient;Family member/caregiver    Family Member Consulted spouse: [name]           Patient will benefit from skilled therapeutic intervention in order to improve the following deficits and impairments:  Abnormal gait, Decreased endurance, Decreased activity tolerance, Pain, Decreased balance, Impaired flexibility, Decreased strength, Postural dysfunction  Visit Diagnosis: Muscle weakness (generalized)  Gait difficulty  Abnormal posture  Balance problem     Problem List Patient Active  Problem List   Diagnosis Date Noted  . DVT femoral (deep venous thrombosis) with thrombophlebitis, left ([place]) 02/06/2020  . Lymphedema 04/15/2019  . Bilateral leg edema 01/25/2019  . Hyponatremia 12/24/2018  . SIADH (syndrome of inappropriate ADH production) ([place]) 12/24/2018  . Erosion of urethra due to catheterization of urinary tract ([place]) 10/27/2018  . Generalized weakness 10/14/2018  . Hip fracture ([place]) 09/15/2018  . 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 HLD (hyperlipidemia) 03/24/2018  . Contusion of right knee 02/14/2018  .  Bradycardia 12/28/2017  . 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 Tremor 09/04/2016  . Chronic pain of right knee 08/10/2016  . Right ankle pain 08/10/2016  . 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  . 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[place]) 12/11/2015  . Urinary retention 09/08/2015  . Lymphoma, 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[place]) 04/03/2015  . Breathlessness on exertion 11/21/2014  . Breath shortness 11/21/2014  . Arthropathy 11/07/2014  . Atrial flutter, paroxysmal ([place]) 11/07/2014  . Type 2 diabetes mellitus ([place]) 11/07/2014  . Benign essential tremor 11/07/2014  . Benign essential HTN 11/07/2014  . Mixed incontinence 11/07/2014  . Hypercholesterolemia without hypertriglyceridemia 11/07/2014  . Apnea, sleep 11/07/2014  . Controlled type 2 diabetes mellitus without complication ([place]) 16/06/9603  . Pure hypercholesterolemia 11/07/2014  . Other abnormalities of gait and mobility 11/02/2011  . Decreased mobility 11/02/2011  . Abnormal gait 06/29/2011  . Discoordination 06/29/2011   Pura Spice, PT, DPT # [id number], SPT 06/18/2020, 7:42 AM  [place] [facility] [address] [place], [place], [address] Phone: [phone]   Fax:  [phone]  Name: Mike Holloway MRN: [id number] Date of Birth: 17-May-1941

## 2020-06-24 ENCOUNTER — Other Ambulatory Visit: Payer: Self-pay

## 2020-06-24 ENCOUNTER — Encounter: Payer: Self-pay | Admitting: Physical Therapy

## 2020-06-24 ENCOUNTER — Ambulatory Visit: Payer: Medicare Other | Admitting: Physical Therapy

## 2020-06-24 DIAGNOSIS — R269 Unspecified abnormalities of gait and mobility: Secondary | ICD-10-CM

## 2020-06-24 DIAGNOSIS — M6281 Muscle weakness (generalized): Secondary | ICD-10-CM

## 2020-06-24 DIAGNOSIS — R293 Abnormal posture: Secondary | ICD-10-CM

## 2020-06-24 DIAGNOSIS — R2689 Other abnormalities of gait and mobility: Secondary | ICD-10-CM

## 2020-06-24 NOTE — Therapy (Signed)
[place] [facility] [address] [place], [place], [address] Phone: [phone]   Fax:  [phone]  Physical Therapy Treatment  Patient Details  Name: Mike Holloway MRN: [id number] Date of Birth: 08-Dec-1940 Referring Provider (PT): Dr. Rudene Christians   Encounter Date: 06/24/2020   PT End of Session - 06/24/20 1402    Visit Number 88    Number of Visits 80    Date for PT Re-Evaluation 07/01/20    Authorization - Visit Number 8    Authorization - Number of Visits 10    PT Start Time 7425    PT Stop Time 9563    PT Time Calculation (min) 72 min    Equipment Utilized During Treatment Gait belt    Activity Tolerance Patient tolerated treatment well;Patient limited by fatigue;No increased pain    Behavior During Therapy WFL for tasks assessed/performed           Past Medical History:  Diagnosis Date   Arthritis    Atrial flutter ([place])    Diabetes mellitus ([place])    Essential tremor    Essential tremor    deep brain stimulator    Hypercholesteremia    Hypertension    Incontinence    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[place])    grew on the testical   SIADH (syndrome of inappropriate ADH production) ([place])    Sleep apnea    Stroke [facility])     Past Surgical History:  Procedure Laterality Date   ABLATION     APPENDECTOMY     CATARACT EXTRACTION, BILATERAL     COLONOSCOPY WITH PROPOFOL N/A 03/17/2018   Procedure: COLONOSCOPY WITH PROPOFOL;  Surgeon: Jonathon Bellows, MD;  Location: [facility] 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   Subjective Assessment - 06/24/20 1400    Subjective Pt. reports no falls since last session. Pt. reports that he was able to walk in the cul-de-sac this past Friday. Normal levels of R knee pain, LBP feeling slightly better.    Patient is accompained by: Family member    Pertinent History Pt. states R knee is bothering him but it is the typical discomfort he experiences    Limitations House hold activities;Walking;Standing;Lifting    Patient Stated Goals Improve LE strength/ gait and balance with daily tasks.  Improve gait/ safety/ progression to least assistive device.    Currently in Pain? Yes    Pain Score 1     Pain Location Knee    Pain Orientation Right    Pain Descriptors / Indicators Aching    Pain Onset More than a month ago    Pain Onset More than a month ago            There Ex:  Supine with knees on red bolster:  -knee ext with  3lb ankle weights: 2x10  -SLR with 3lb ankle weights: 2x10  -bridges 2x10  Nustep LE only L5 10 mins (unbilled)  Neuro re-ed:  Outdoor walking over uneven terrain: pt walked from clinic entrance around outside clinic to side door on sidewalk. Then pt walked from clinic side door to side parking lot. Pt required SBA, verbal cueing for larger steps. Pt demonstrated decreased step length bilat when descending slight hills.   Standing ant/post weightshifting with pt instructed to focus on feeling in heels: 10x each direction. Pt progressed to standing with mild perturbations applied anteriorly and posteriorly.   Standing ball kicks: pt standing in // bars initially with both hands on bar. Pt able to kick with either leg when told before hand. Pt decreased to one hand support, required mild-mod verbal cueing to  maintain only one hand on bar at a time. Pt standing for approx 6-8 mins.     Pt required many seated/supine rest breaks throughout session.          PT Long Term Goals - 05/06/20 1510      PT LONG TERM GOAL #1   Title Pt. independent with HEP to increase B hip flexion/ R quad muscle strength 1/2 muscle grade to improve pain-free mobility.    Baseline R knee extension limited to 4/5 MMT secondary to pain/ fear of pain. 2/9: Deferred.  4/13: 4+/5 MMT (max potential).    Time 4    Period Weeks    Status Achieved      PT LONG TERM GOAL #2   Title Pt. will increase Berg balance test to >40 out of 56 to improve independence with gait/ decrease fall risk.    Baseline Berg: 22/56 (significant fall risk); 9/30 32/56.  11/10: 32 (limited by R knee pain)  2/9: 33/56 limited due to R knee and Low Back pain; 8/10: 29/56    Time 8    Period Weeks    Status Not Met    Target Date 07/01/20      PT LONG TERM GOAL #3   Title Pt. able to ambulate 100 feet with consistent 2 point gait pattern and use of least restrictive assistive devce to improve household mobility.    Baseline Pt. able to ambulate with use of RW/ CGA to min. A for safety.   Heavy use of B UE.; 9/30 pt able to ambulate with rollator with inconsistent 2 point gait pattern, heavy reliance on UE CGA/supervision  2/9: Pt. uses 2 point step pattern with walking with rollader with decreased stride length. Pt. would be limited due to pain in the knee walking for >100 ft.; 8/10: Pt demonstrates ability to ambulate 100' with consistent 2 point gait pattern with rollator.    Time 0    Period Weeks    Status Achieved    Target Date 05/06/20      PT LONG TERM GOAL #4   Title Pt. able to stand from normal chair with no UE assist to improve safety/independence with transfers.     Baseline Unable to stand from standard chair without heavy UE assist. 10/6, pt unable to stand from standard chair without UE support.  11/10: benefits from 1 UE  assist    2/9: Pt. can rise from normal chair with 1 UE assist and must control descent with UE assist.; 8/10: Pt requires 2 UE for standing from standard chair height.    Time 8    Period Weeks    Status Partially Met  Target Date 07/01/20      PT LONG TERM GOAL #5   Title Pt. will perform a sit to stand with 1 UE support to be able to perform toileting activities with more independance.    Baseline Pt. requires 2 UE to push from to sit to stand from standard chair height. Pt. has grab rails available but can not get up by pulling.; 8/10: pt requires 2 UE's to STS from standard chair height.    Time 8    Period Weeks    Status Partially Met    Target Date 07/01/20      Additional Long Term Goals   Additional Long Term Goals Yes      PT LONG TERM GOAL #6   Title Pt. will ascend/ descend 4 steps with use of B handrails and step pattern with mod. independence safely to be able to enter son's house.    Baseline Step pattern with heavy UE assist and CGA/min. A for safety.; 8/10: Pt safely asc/desc 4 steps with B hand rails and step to pattern with mod indep to enter son's house safely.    Time 0    Period Weeks    Status Achieved    Target Date 05/06/20      PT LONG TERM GOAL #7   Title Pt will be able to stand at bathroom sink for 5 min with no UE support to complete ADL's like brushing his teeth, washing his hands, and combing his hair.    Baseline 8/10: Ability to stand with no UE support for 2 min.    Time 8    Period Weeks    Status New    Target Date 07/01/20                 Plan - 06/24/20 1512    Clinical Impression Statement Pt. demonstrates improved ability to tolerate standing for up to 8 mins at a time. During standing, pt was instructed to focus on where he feels weight on his heels due to that being the only place he has sensation on his feet. Pt. responded well to anterior/posterior mild perturbations in standing with no UE support. Pt. completed supine  activities today and demonstrated improved bed mobility compared to prior attempts. Pt. completed supine open chain LE exercises with knees on red bolster. Pt. will conitnue to benefit from skilled PT to improve functional mobility.    Personal Factors and Comorbidities Age    Examination-Activity Limitations Toileting;Stand;Squat;Lift;Stairs    Stability/Clinical Decision Making Evolving/Moderate complexity    Clinical Decision Making Moderate    Rehab Potential Fair    PT Frequency 1x / week    PT Duration 8 weeks    PT Treatment/Interventions Moist Heat;Functional mobility training;Therapeutic activities;Therapeutic exercise;Balance training;Patient/family education;Manual techniques;Passive range of motion;Neuromuscular re-education;Stair training;Gait training;ADLs/Self Care Home Management    PT Next Visit Plan Hip strength, dynamic gait    PT Home Exercise Plan [email or web address]    Consulted and Agree with Plan of Care Patient;Family member/caregiver    Family Member Consulted spouse: [name]           Patient will benefit from skilled therapeutic intervention in order to improve the following deficits and impairments:  Abnormal gait, Decreased endurance, Decreased activity tolerance, Pain, Decreased balance, Impaired flexibility, Decreased strength, Postural dysfunction  Visit Diagnosis: Muscle weakness (generalized)  Gait difficulty  Abnormal posture  Balance problem     Problem List Patient Active Problem List   Diagnosis Date Noted  DVT femoral (deep venous thrombosis) with thrombophlebitis, left ([facility]) 02/06/2020   Lymphedema 04/15/2019   Bilateral leg edema 01/25/2019   Hyponatremia 12/24/2018   SIADH (syndrome of inappropriate ADH production) ([place]) 12/24/2018   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) 10/27/2018   Generalized weakness 10/14/2018   Hip fracture ([place]) 09/15/2018   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   Blepharospasm  syndrome 06/08/2018   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   HLD (hyperlipidemia) 03/24/2018   Contusion of right knee 02/14/2018   Bradycardia 12/28/2017   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   Tremor 09/04/2016   Chronic pain of right knee 08/10/2016   Right ankle pain 08/10/2016   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[place]) 12/11/2015   Urinary retention 09/08/2015   Lymphoma, 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[place]) 04/03/2015   Breathlessness on exertion 11/21/2014   Breath shortness 11/21/2014   Arthropathy 11/07/2014   Atrial flutter, paroxysmal ([place]) 11/07/2014   Type 2 diabetes mellitus ([place]) 11/07/2014   Benign essential tremor 11/07/2014   Benign essential HTN 11/07/2014   Mixed incontinence 11/07/2014   Hypercholesterolemia without hypertriglyceridemia 11/07/2014   Apnea, sleep 11/07/2014   Controlled type 2 diabetes mellitus without complication ([place]) 83/37/4451   Pure hypercholesterolemia 11/07/2014   Other abnormalities of gait and mobility 11/02/2011   Decreased mobility 11/02/2011   Abnormal gait 06/29/2011   Discoordination 06/29/2011   Pura Spice, PT, DPT # [id number], SPT 06/25/2020, 11:52 AM  [place] [facility] [address]. [place], [place], [address] Phone: [phone]   Fax:  [phone]  Name: Finnick Orosz MRN: [id number] Date of Birth: 23-Nov-1940

## 2020-07-01 ENCOUNTER — Ambulatory Visit: Payer: Medicare Other | Attending: Orthopedic Surgery

## 2020-07-01 ENCOUNTER — Other Ambulatory Visit: Payer: Self-pay

## 2020-07-01 ENCOUNTER — Encounter: Payer: Self-pay | Admitting: Physical Therapy

## 2020-07-01 DIAGNOSIS — R2689 Other abnormalities of gait and mobility: Secondary | ICD-10-CM

## 2020-07-01 DIAGNOSIS — R293 Abnormal posture: Secondary | ICD-10-CM | POA: Diagnosis present

## 2020-07-01 DIAGNOSIS — M6281 Muscle weakness (generalized): Secondary | ICD-10-CM

## 2020-07-01 DIAGNOSIS — R269 Unspecified abnormalities of gait and mobility: Secondary | ICD-10-CM | POA: Diagnosis present

## 2020-07-01 NOTE — Therapy (Signed)
[facility] [address]. [place], [place], [address] Phone: [phone]   Fax:  [phone]  Physical Therapy Treatment/RECERT Physical Therapy Progress Note  Dates of reporting period  05/06/2020  to  07/01/2020  Patient Details  Name: Mike Holloway MRN: [id number] Date of Birth: 21-Oct-1940 Referring Provider (PT): Dr. Rudene Christians   Encounter Date: 07/01/2020   PT End of Session - 07/01/20 1718    Visit Number 73    Number of Visits 88    Date for PT Re-Evaluation 08/26/20    Authorization - Visit Number 9    Authorization - Number of Visits 18    PT Start Time 8315    PT Stop Time 1761    PT Time Calculation (min) 55 min    Equipment Utilized During Treatment Gait belt    Activity Tolerance Patient tolerated treatment well;Patient limited by fatigue;No increased pain    Behavior During Therapy WFL for tasks assessed/performed           Past Medical History:  Diagnosis Date  . Arthritis   . Atrial flutter ([place])   . Diabetes mellitus ([place])   . Essential tremor   . Essential tremor    deep brain stimulator   . Hypercholesteremia   . Hypertension   . Incontinence   . 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[place])    grew on the testical  . SIADH (syndrome of inappropriate ADH production) ([place])   . Sleep apnea   . Stroke [facility])     Past Surgical History:  Procedure Laterality Date  . ABLATION    . APPENDECTOMY    . CATARACT EXTRACTION, BILATERAL    . COLONOSCOPY WITH PROPOFOL N/A 03/17/2018   Procedure: COLONOSCOPY WITH PROPOFOL;  Surgeon: Jonathon Bellows, MD;  Location: [facility] ENDOSCOPY;  Service: Gastroenterology;  Laterality: N/A;  . DEEP BRAIN STIMULATOR PLACEMENT    . 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 HEMORRHOID SURGERY    . HERNIA REPAIR    . HIP FRACTURE SURGERY    . INTRAMEDULLARY (IM) NAIL  INTERTROCHANTERIC Right 09/16/2018   Procedure: INTRAMEDULLARY (IM) NAIL INTERTROCHANTRIC;  Surgeon: Hessie Knows, MD;  Location: [facility] ORS;  Service: Orthopedics;  Laterality: Right;  . IR CATHETER TUBE CHANGE  11/22/2018  . NASAL SINUS SURGERY    . ORCHIECTOMY    . TONSILLECTOMY      There were no vitals filed for this visit.   Subjective Assessment - 07/01/20 1716    Subjective Pt and spouse report that pt is being more indep with simple tasks like walking around home 3-4 laps and going to kitchen to get food and/or a drink for example. Spouse reports PT can continue to be helpful to further improve pt's BLE strength for improved indep with toileting and pt still wishes to improve his walking abilities.    Patient is accompained by: Family member    Pertinent History Pt. states R knee is bothering him but it is the typical discomfort he experiences    Limitations House hold activities;Walking;Standing;Lifting    Patient Stated Goals Improve LE strength/ gait and balance with daily tasks.  Improve gait/ safety/ progression to least assistive device.    Currently in Pain? No/denies    Pain Score 0-No pain    Pain Onset More than a month ago    Pain Onset More than a month ago  There.ex:   Nu-Step L5 for 10 min. LE's only. Not billed.    Neuro Re-Ed:   Reassessment of goals today.   Ber balance: 38/56    Standing tolerance: 3.5 min with a goal of 5 min with no UE support to improve indep with  ADL's such as combing hair and brushing teeth.    Ability to stand form standard height chair with no UE support if possible or at least use of single UE support. Can perform single UE support but requires at least one arm to push with.    New goal written to improve LE strength for improving toilet transfer to rescue caregiver burden.   PT Education - 07/01/20 1718    Education Details exercise form/technique.    Person(s) Educated Patient    Methods Explanation;Demonstration     Comprehension Verbalized understanding;Returned demonstration               PT Long Term Goals - 07/01/20 1719      PT LONG TERM GOAL #1   Title Pt. independent with HEP to increase B hip flexion/ R quad muscle strength 1/2 muscle grade to improve pain-free mobility.    Baseline R knee extension limited to 4/5 MMT secondary to pain/ fear of pain. 2/9: Deferred.  4/13: 4+/5 MMT (max potential).    Time 4    Period Weeks    Status Achieved      PT LONG TERM GOAL #2   Title Pt. will increase Berg balance test to >40 out of 56 to improve independence with gait/ decrease fall risk.    Baseline Berg: 22/56 (significant fall risk); 9/30 32/56.  11/10: 32 (limited by R knee pain)  2/9: 33/56 limited due to R knee and Low Back pain; 8/10: 29/56; 10/5: 38/56    Time 8    Period Weeks    Status Not Met    Target Date 08/26/20      PT LONG TERM GOAL #3   Title Pt. able to ambulate 100 feet with consistent 2 point gait pattern and use of least restrictive assistive devce to improve household mobility.    Baseline Pt. able to ambulate with use of RW/ CGA to min. A for safety.   Heavy use of B UE.; 9/30 pt able to ambulate with rollator with inconsistent 2 point gait pattern, heavy reliance on UE CGA/supervision  2/9: Pt. uses 2 point step pattern with walking with rollader with decreased stride length. Pt. would be limited due to pain in the knee walking for >100 ft.; 8/10: Pt demonstrates ability to ambulate 100' with consistent 2 point gait pattern with rollator.    Time 0    Period Weeks    Status Achieved      PT LONG TERM GOAL #4   Title Pt. able to stand from normal chair with no UE assist to improve safety/independence with transfers.     Baseline Unable to stand from standard chair without heavy UE assist. 10/6, pt unable to stand from standard chair without UE support.  11/10: benefits from 1 UE assist    2/9: Pt. can rise from normal chair with 1 UE assist and must control descent  with UE assist.; 8/10: Pt requires 2 UE for standing from standard chair height.; 10/5: Requires single UE to stand.    Time 8    Period Weeks    Status Partially Met    Target Date 07/29/20      PT LONG TERM  GOAL #5   Title Pt. will perform a sit to stand with 1 UE support to be able to perform toileting activities with more independance.    Baseline Pt. requires 2 UE to push from to sit to stand from standard chair height. Pt. has grab rails available but can not get up by pulling.; 8/10: pt requires 2 UE's to STS from standard chair height.; 10/5: Only required single UE support to stand. Use of momentum but able to perform independently.    Time 8    Period Weeks    Status Achieved    Target Date 07/01/20      PT LONG TERM GOAL #6   Title Pt. will ascend/ descend 4 steps with use of B handrails and step pattern with mod. independence safely to be able to enter son's house.    Baseline Step pattern with heavy UE assist and CGA/min. A for safety.; 8/10: Pt safely asc/desc 4 steps with B hand rails and step to pattern with mod indep to enter son's house safely.    Time 0    Period Weeks    Status Achieved      PT LONG TERM GOAL #7   Title Pt will be able to stand at bathroom sink for 5 min with no UE support to complete ADL's like brushing his teeth, washing his hands, and combing his hair.    Baseline 8/10: Ability to stand with no UE support for 2 min.; 10/5: 3.5 min due to LBP. Has demonstrated in previous PT sessions he can stand for 5 min.    Time 8    Period Weeks    Status Partially Met    Target Date 08/26/20      PT LONG TERM GOAL #8   Title Pt will demonstrate ability to stand from a lowered surface below 18" with single Ue support to demonstrate improved BLE strength to improve transfers from toilet.    Baseline 10/5: Able to stand from 18" chair with single UE support with use of momentum.    Time 8    Period Weeks    Status New    Target Date 08/26/20                  Plan - 07/01/20 1725    Clinical Impression Statement Goals reassessed today. Pt demonstrating improvements towards goals. Pt improved Berg balance to 38/56. Although an improvement, pt is still at risk for falls. Pt also demonstrated improved ability to stand with no UE support to 3.5 min with a goal of 5 min. Pt initially only able to stand for 2 min. Pt's main limitation with this task is LBP. Pt also displaying ability to stand from standard height chair with single UE support with use of momentum. Pt still unable to stand form this surface without use of at least one UE. New goal written to try to improve BLE strength to improve toilet transfer tasks. Pt can continue to benefit from skilled PT services to improve pt's functional mobility.    Personal Factors and Comorbidities Age    Examination-Activity Limitations Toileting;Stand;Squat;Lift;Stairs    Stability/Clinical Decision Making Evolving/Moderate complexity    Clinical Decision Making Moderate    Rehab Potential Fair    PT Frequency 1x / week    PT Duration 8 weeks    PT Treatment/Interventions Moist Heat;Functional mobility training;Therapeutic activities;Therapeutic exercise;Balance training;Patient/family education;Manual techniques;Passive range of motion;Neuromuscular re-education;Stair training;Gait training;ADLs/Self Care Home Management    PT Next Visit  Plan SLR, bridges    PT Home Exercise Plan [email or web address]    Consulted and Agree with Plan of Care Patient;Family member/caregiver    Family Member Consulted spouse: [name]           Patient will benefit from skilled therapeutic intervention in order to improve the following deficits and impairments:  Abnormal gait, Decreased endurance, Decreased activity tolerance, Pain, Decreased balance, Impaired flexibility, Decreased strength, Postural dysfunction  Visit Diagnosis: Muscle weakness (generalized)  Gait difficulty  Abnormal posture  Balance  problem     Problem List Patient Active Problem List   Diagnosis Date Noted  . DVT femoral (deep venous thrombosis) with thrombophlebitis, left ([place]) 02/06/2020  . Lymphedema 04/15/2019  . Bilateral leg edema 01/25/2019  . Hyponatremia 12/24/2018  . SIADH (syndrome of inappropriate ADH production) ([place]) 12/24/2018  . Erosion of urethra due to catheterization of urinary tract ([place]) 10/27/2018  . Generalized weakness 10/14/2018  . Hip fracture ([place]) 09/15/2018  . 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 HLD (hyperlipidemia) 03/24/2018  . Contusion of right knee 02/14/2018  . Bradycardia 12/28/2017  . 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 Tremor 09/04/2016  . Chronic pain of right knee 08/10/2016  . Right ankle pain 08/10/2016  . 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  . 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[place]) 12/11/2015  . Urinary retention 09/08/2015  . Lymphoma, 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[place]) 04/03/2015  . Breathlessness on exertion 11/21/2014  . Breath shortness 11/21/2014  . Arthropathy 11/07/2014  . Atrial flutter, paroxysmal ([place]) 11/07/2014  . Type 2 diabetes mellitus ([place]) 11/07/2014  . Benign essential tremor 11/07/2014  . Benign essential HTN 11/07/2014  . Mixed incontinence 11/07/2014  . Hypercholesterolemia without hypertriglyceridemia 11/07/2014  . Apnea, sleep 11/07/2014  . Controlled type 2 diabetes mellitus without complication ([place]) 47/34/0370  . Pure hypercholesterolemia 11/07/2014  . Other abnormalities of gait and mobility 11/02/2011  . Decreased mobility 11/02/2011  . Abnormal gait 06/29/2011  . Discoordination 06/29/2011    Larna Daughters, SPT 07/01/2020, 5:29 PM  [facility] [address] [place], [place], [address] Phone: [phone]   Fax:  [phone]  Name: Mike Holloway MRN:  [id number] Date of Birth: December 22, 1940

## 2020-07-03 ENCOUNTER — Ambulatory Visit (INDEPENDENT_AMBULATORY_CARE_PROVIDER_SITE_OTHER): Payer: Medicare Other | Admitting: Vascular Surgery

## 2020-07-08 ENCOUNTER — Ambulatory Visit (INDEPENDENT_AMBULATORY_CARE_PROVIDER_SITE_OTHER): Payer: Medicare Other | Admitting: Physician Assistant

## 2020-07-08 ENCOUNTER — Ambulatory Visit: Payer: Medicare Other | Admitting: Physical Therapy

## 2020-07-08 ENCOUNTER — Other Ambulatory Visit: Payer: Self-pay

## 2020-07-08 ENCOUNTER — Encounter: Payer: Self-pay | Admitting: Physician Assistant

## 2020-07-08 ENCOUNTER — Encounter: Payer: Self-pay | Admitting: Physical Therapy

## 2020-07-08 VITALS — Ht 70.0 in | Wt 217.0 lb

## 2020-07-08 DIAGNOSIS — M6281 Muscle weakness (generalized): Secondary | ICD-10-CM | POA: Diagnosis not present

## 2020-07-08 DIAGNOSIS — R339 Retention of urine, unspecified: Secondary | ICD-10-CM

## 2020-07-08 DIAGNOSIS — R2689 Other abnormalities of gait and mobility: Secondary | ICD-10-CM

## 2020-07-08 DIAGNOSIS — R293 Abnormal posture: Secondary | ICD-10-CM

## 2020-07-08 DIAGNOSIS — R269 Unspecified abnormalities of gait and mobility: Secondary | ICD-10-CM

## 2020-07-08 NOTE — Progress Notes (Signed)
Cath Change/ Replacement  Patient is present today for a catheter change due to urinary retention.  63ml of water was removed from the balloon, a 16FR coude foley cath was removed without difficulty.  Patient was cleaned and prepped in a sterile fashion with betadine and 2% lidocaine jelly was instilled into the urethra. A 16 FR coude foley cath was replaced into the bladder no complications were noted Urine return was noted 23ml and patient was counseled to follow up in clinic if urine did not begin to flow within 2 hours. The balloon was filled with 40ml of sterile water. A leg bag was attached for drainage.  A night bag was also given to the patient and patient was given instruction on how to change from one bag to another.  Performed by: Debroah Loop, PA-C   Follow up: Return in about 4 weeks (around 08/05/2020) for Catheter exchange.

## 2020-07-08 NOTE — Therapy (Signed)
[facility] [address] [place], [place], [address] Phone: [phone]   Fax:  [phone]  Physical Therapy Treatment   Patient Details  Name: Mike Holloway MRN: [id number] Date of Birth: 1941-04-13 Referring Provider (PT): Dr. Rudene Christians   Encounter Date: 07/08/2020   PT End of Session - 07/08/20 1357    Visit Number 81    Number of Visits 50    Date for PT Re-Evaluation 08/26/20    Authorization - Visit Number 10    Authorization - Number of Visits 18    PT Start Time 8022    PT Stop Time 3361    PT Time Calculation (min) 64 min    Equipment Utilized During Treatment Gait belt    Activity Tolerance Patient tolerated treatment well;Patient limited by fatigue;No increased pain    Behavior During Therapy WFL for tasks assessed/performed           Past Medical History:  Diagnosis Date  . Arthritis   . Atrial flutter ([place])   . Diabetes mellitus ([place])   . Essential tremor   . Essential tremor    deep brain stimulator   . Hypercholesteremia   . Hypertension   . Incontinence   . 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[place])    grew on the testical  . SIADH (syndrome of inappropriate ADH production) ([place])   . Sleep apnea   . Stroke [facility])     Past Surgical History:  Procedure Laterality Date  . ABLATION    . APPENDECTOMY    . CATARACT EXTRACTION, BILATERAL    . COLONOSCOPY WITH PROPOFOL N/A 03/17/2018   Procedure: COLONOSCOPY WITH PROPOFOL;  Surgeon: Jonathon Bellows, MD;  Location: [facility] ENDOSCOPY;  Service: Gastroenterology;  Laterality: N/A;  . DEEP BRAIN STIMULATOR PLACEMENT    . 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 HEMORRHOID SURGERY    . HERNIA REPAIR    . HIP FRACTURE SURGERY    . INTRAMEDULLARY (IM) NAIL INTERTROCHANTERIC Right 09/16/2018   Procedure: INTRAMEDULLARY (IM) NAIL INTERTROCHANTRIC;  Surgeon:  Hessie Knows, MD;  Location: [facility] ORS;  Service: Orthopedics;  Laterality: Right;  . IR CATHETER TUBE CHANGE  11/22/2018  . NASAL SINUS SURGERY    . ORCHIECTOMY    . TONSILLECTOMY      There were no vitals filed for this visit.   Subjective Assessment - 07/08/20 1350    Subjective Pt reports amb multiple laps around house per day and being more active. Pt wants to attempt amb with single LSC.    Patient is accompained by: Family member    Pertinent History Pt. states R knee is bothering him but it is the typical discomfort he experiences    Limitations House hold activities;Walking;Standing;Lifting    Patient Stated Goals Improve LE strength/ gait and balance with daily tasks.  Improve gait/ safety/ progression to least assistive device.    Currently in Pain? Yes    Pain Score 1     Pain Location Knee    Pain Orientation Right    Pain Descriptors / Indicators Aching    Pain Onset More than a month ago    Pain Frequency Intermittent    Pain Onset More than a month ago          There.ex:   Nu-Step L5 for 6 min. LE's only. Not billed.  Supine bridges with red bolster  under knees: 2x12 reps 10 lbs AW's  Supine B alternating knee extensions: 2x12 reps. Verbal cues for full knee extension.10 lbs AW's  Supine B alternating SLR: 1x5 reps RLE and 1x10 LLE. 5 lbs AW's     Neuro Re-Ed:   Amb in // bars with B LSC's. Unable to perform due to tremor in LUE.   Amb in // bars with single LSC in RUE and LUE assist on // bars with CGA+1. Safe progression with 2 point gait pattern. x4 with progression to 2 finger assist on LUE. x4 with finger support. Required intermittent LUE support. Decreased gait speed noted.  Amb in // bars with quad cane in RUE and finger support in // bars. CGA+1. x4 with safe amb.   Pt unable to indep amb with single LSC without LUE support safely. Noted decreased gait speed and reports of fear without using LUE on // bars for stability. At this time the rollator is  still the best option for pt's ambulatory tasks.   Frequent seated rest breaks needed.   PT Education - 07/08/20 1355    Education Details form/technique with exercise.    Person(s) Educated Patient    Methods Explanation;Demonstration;Tactile cues;Verbal cues    Comprehension Verbalized understanding;Returned demonstration               PT Long Term Goals - 07/01/20 1719      PT LONG TERM GOAL #1   Title Pt. independent with HEP to increase B hip flexion/ R quad muscle strength 1/2 muscle grade to improve pain-free mobility.    Baseline R knee extension limited to 4/5 MMT secondary to pain/ fear of pain. 2/9: Deferred.  4/13: 4+/5 MMT (max potential).    Time 4    Period Weeks    Status Achieved      PT LONG TERM GOAL #2   Title Pt. will increase Berg balance test to >40 out of 56 to improve independence with gait/ decrease fall risk.    Baseline Berg: 22/56 (significant fall risk); 9/30 32/56.  11/10: 32 (limited by R knee pain)  2/9: 33/56 limited due to R knee and Low Back pain; 8/10: 29/56; 10/5: 38/56    Time 8    Period Weeks    Status Not Met    Target Date 08/26/20      PT LONG TERM GOAL #3   Title Pt. able to ambulate 100 feet with consistent 2 point gait pattern and use of least restrictive assistive devce to improve household mobility.    Baseline Pt. able to ambulate with use of RW/ CGA to min. A for safety.   Heavy use of B UE.; 9/30 pt able to ambulate with rollator with inconsistent 2 point gait pattern, heavy reliance on UE CGA/supervision  2/9: Pt. uses 2 point step pattern with walking with rollader with decreased stride length. Pt. would be limited due to pain in the knee walking for >100 ft.; 8/10: Pt demonstrates ability to ambulate 100' with consistent 2 point gait pattern with rollator.    Time 0    Period Weeks    Status Achieved      PT LONG TERM GOAL #4   Title Pt. able to stand from normal chair with no UE assist to improve safety/independence  with transfers.     Baseline Unable to stand from standard chair without heavy UE assist. 10/6, pt unable to stand from standard chair without UE support.  11/10: benefits from 1 UE assist  2/9: Pt. can rise from normal chair with 1 UE assist and must control descent with UE assist.; 8/10: Pt requires 2 UE for standing from standard chair height.; 10/5: Requires single UE to stand.    Time 8    Period Weeks    Status Partially Met    Target Date 07/29/20      PT LONG TERM GOAL #5   Title Pt. will perform a sit to stand with 1 UE support to be able to perform toileting activities with more independance.    Baseline Pt. requires 2 UE to push from to sit to stand from standard chair height. Pt. has grab rails available but can not get up by pulling.; 8/10: pt requires 2 UE's to STS from standard chair height.; 10/5: Only required single UE support to stand. Use of momentum but able to perform independently.    Time 8    Period Weeks    Status Achieved    Target Date 07/01/20      PT LONG TERM GOAL #6   Title Pt. will ascend/ descend 4 steps with use of B handrails and step pattern with mod. independence safely to be able to enter son's house.    Baseline Step pattern with heavy UE assist and CGA/min. A for safety.; 8/10: Pt safely asc/desc 4 steps with B hand rails and step to pattern with mod indep to enter son's house safely.    Time 0    Period Weeks    Status Achieved      PT LONG TERM GOAL #7   Title Pt will be able to stand at bathroom sink for 5 min with no UE support to complete ADL's like brushing his teeth, washing his hands, and combing his hair.    Baseline 8/10: Ability to stand with no UE support for 2 min.; 10/5: 3.5 min due to LBP. Has demonstrated in previous PT sessions he can stand for 5 min.    Time 8    Period Weeks    Status Partially Met    Target Date 08/26/20      PT LONG TERM GOAL #8   Title Pt will demonstrate ability to stand from a lowered surface below 18"  with single Ue support to demonstrate improved BLE strength to improve transfers from toilet.    Baseline 10/5: Able to stand from 18" chair with single UE support with use of momentum.    Time 8    Period Weeks    Status New    Target Date 08/26/20                 Plan - 07/08/20 1449    Clinical Impression Statement Pt progressed in supine LE exercises. Most difficulty with SLR. Unable to perform with 10 lbs AW's so use of 5 lbs AW's. Attempted bouts of amb with single AD's such as quad cane and single LSC. Pt was safe amb with CGA+1 but still requires BUE support so rollator is still most appropriate device for this pt at this time. Pt can continue to benefit from skilled PT treatment to improve functional mobility and reduce risks of falls.    Personal Factors and Comorbidities Age    Examination-Activity Limitations Toileting;Stand;Squat;Lift;Stairs    Stability/Clinical Decision Making Evolving/Moderate complexity    Clinical Decision Making Moderate    Rehab Potential Fair    PT Frequency 1x / week    PT Duration 8 weeks    PT Treatment/Interventions Moist Heat;Functional  mobility training;Therapeutic activities;Therapeutic exercise;Balance training;Patient/family education;Manual techniques;Passive range of motion;Neuromuscular re-education;Stair training;Gait training;ADLs/Self Care Home Management    PT Next Visit Plan SLR, bridges    PT Home Exercise Plan [email or web address]    Consulted and Agree with Plan of Care Patient;Family member/caregiver    Family Member Consulted spouse: [name]           Patient will benefit from skilled therapeutic intervention in order to improve the following deficits and impairments:  Abnormal gait, Decreased endurance, Decreased activity tolerance, Pain, Decreased balance, Impaired flexibility, Decreased strength, Postural dysfunction  Visit Diagnosis: Muscle weakness (generalized)  Gait difficulty  Abnormal posture  Balance  problem     Problem List Patient Active Problem List   Diagnosis Date Noted  . DVT femoral (deep venous thrombosis) with thrombophlebitis, left ([place]) 02/06/2020  . Lymphedema 04/15/2019  . Bilateral leg edema 01/25/2019  . Hyponatremia 12/24/2018  . SIADH (syndrome of inappropriate ADH production) ([place]) 12/24/2018  . Erosion of urethra due to catheterization of urinary tract ([place]) 10/27/2018  . Generalized weakness 10/14/2018  . Hip fracture ([place]) 09/15/2018  . 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 HLD (hyperlipidemia) 03/24/2018  . Contusion of right knee 02/14/2018  . Bradycardia 12/28/2017  . 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 Tremor 09/04/2016  . Chronic pain of right knee 08/10/2016  . Right ankle pain 08/10/2016  . 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  . 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[place]) 12/11/2015  . Urinary retention 09/08/2015  . Lymphoma, 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[place]) 04/03/2015  . Breathlessness on exertion 11/21/2014  . Breath shortness 11/21/2014  . Arthropathy 11/07/2014  . Atrial flutter, paroxysmal ([place]) 11/07/2014  . Type 2 diabetes mellitus ([place]) 11/07/2014  . Benign essential tremor 11/07/2014  . Benign essential HTN 11/07/2014  . Mixed incontinence 11/07/2014  . Hypercholesterolemia without hypertriglyceridemia 11/07/2014  . Apnea, sleep 11/07/2014  . Controlled type 2 diabetes mellitus without complication ([place]) 50/35/4656  . Pure hypercholesterolemia 11/07/2014  . Other abnormalities of gait and mobility 11/02/2011  . Decreased mobility 11/02/2011  . Abnormal gait 06/29/2011  . Discoordination 06/29/2011    Larna Daughters, SPT 07/08/2020, 3:30 PM  This entire session was performed under direct supervision and direction of a licensed therapist/therapist assistant . I have personally read, edited and approve of the note as  written. Myles Gip PT, DPT [phone]   [facility] [address] [place], [place], [address] Phone: [phone]   Fax:  [phone]  Name: Mike Holloway MRN: [id number] Date of Birth: 1941-02-14

## 2020-07-15 ENCOUNTER — Encounter: Payer: Self-pay | Admitting: Physical Therapy

## 2020-07-15 ENCOUNTER — Ambulatory Visit: Payer: Medicare Other

## 2020-07-15 ENCOUNTER — Other Ambulatory Visit: Payer: Self-pay

## 2020-07-15 DIAGNOSIS — R293 Abnormal posture: Secondary | ICD-10-CM

## 2020-07-15 DIAGNOSIS — R269 Unspecified abnormalities of gait and mobility: Secondary | ICD-10-CM

## 2020-07-15 DIAGNOSIS — R2689 Other abnormalities of gait and mobility: Secondary | ICD-10-CM

## 2020-07-15 DIAGNOSIS — M6281 Muscle weakness (generalized): Secondary | ICD-10-CM | POA: Diagnosis not present

## 2020-07-15 NOTE — Therapy (Signed)
[facility] [address] [place], [place], [address] Phone: [phone]   Fax:  [phone]  Physical Therapy Treatment  Patient Details  Name: Mike Holloway MRN: [id number] Date of Birth: 1941/03/08 Referring Provider (PT): Dr. Rudene Christians   Encounter Date: 07/15/2020   PT End of Session - 07/15/20 1349    Visit Number 82    Number of Visits 88    Date for PT Re-Evaluation 08/26/20    Authorization - Visit Number 11    Authorization - Number of Visits 18    PT Start Time 2542    PT Stop Time 7062    PT Time Calculation (min) 47 min    Equipment Utilized During Treatment Gait belt    Activity Tolerance Patient tolerated treatment well;Patient limited by fatigue;No increased pain    Behavior During Therapy WFL for tasks assessed/performed           Past Medical History:  Diagnosis Date  . Arthritis   . Atrial flutter ([place])   . Diabetes mellitus ([place])   . Essential tremor   . Essential tremor    deep brain stimulator   . Hypercholesteremia   . Hypertension   . Incontinence   . 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[place])    grew on the testical  . SIADH (syndrome of inappropriate ADH production) ([place])   . Sleep apnea   . Stroke [facility])     Past Surgical History:  Procedure Laterality Date  . ABLATION    . APPENDECTOMY    . CATARACT EXTRACTION, BILATERAL    . COLONOSCOPY WITH PROPOFOL N/A 03/17/2018   Procedure: COLONOSCOPY WITH PROPOFOL;  Surgeon: Jonathon Bellows, MD;  Location[facility] ENDOSCOPY;  Service: Gastroenterology;  Laterality: N/A;  . DEEP BRAIN STIMULATOR PLACEMENT    . 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 HEMORRHOID SURGERY    . HERNIA REPAIR    . HIP FRACTURE SURGERY    . INTRAMEDULLARY (IM) NAIL INTERTROCHANTERIC Right 09/16/2018   Procedure: INTRAMEDULLARY (IM) NAIL INTERTROCHANTRIC;  Surgeon:  Hessie Knows, MD;  Location: [facility] ORS;  Service: Orthopedics;  Laterality: Right;  . IR CATHETER TUBE CHANGE  11/22/2018  . NASAL SINUS SURGERY    . ORCHIECTOMY    . TONSILLECTOMY      There were no vitals filed for this visit.   Subjective Assessment - 07/15/20 1347    Subjective Pt. reports that he has been able to walk a few laps around his house. Pt. has not been able to walk outside since.    Patient is accompained by: Family member    Pertinent History Pt. states R knee is bothering him but it is the typical discomfort he experiences    Limitations House hold activities;Walking;Standing;Lifting    Patient Stated Goals Improve LE strength/ gait and balance with daily tasks.  Improve gait/ safety/ progression to least assistive device.    Currently in Pain? No/denies    Pain Onset More than a month ago    Pain Onset More than a month ago            There Ex:  Pt required prolonged seated rest breaks due to fatigue in LE.   STS practice from blue mat and chair: 15x total, broken into sets of 5. Pt then practiced standing, first weight shifting towards the R leg, with reaching motion for pants with  first keeping R hand on walker reaching down L leg, then shifting to L leg with no support on walker to reach down L side. Pt completed "pant reaching" exercise 3x with SBA.  Nustep L5 10 mins (unbilled)   Neuro Re-ed:   Outdoor walking: Pt walking outside with rollator and CGA. Pt practiced ambulating over uneven pavement, and practiced walking 5 feet over grass. Pt required 1 seated rest break. Pt required extra time with rollator placement on uneven pavement. 20 mins.                               PT Long Term Goals - 07/01/20 1719      PT LONG TERM GOAL #1   Title Pt. independent with HEP to increase B hip flexion/ R quad muscle strength 1/2 muscle grade to improve pain-free mobility.    Baseline R knee extension limited to 4/5 MMT secondary to pain/  fear of pain. 2/9: Deferred.  4/13: 4+/5 MMT (max potential).    Time 4    Period Weeks    Status Achieved      PT LONG TERM GOAL #2   Title Pt. will increase Berg balance test to >40 out of 56 to improve independence with gait/ decrease fall risk.    Baseline Berg: 22/56 (significant fall risk); 9/30 32/56.  11/10: 32 (limited by R knee pain)  2/9: 33/56 limited due to R knee and Low Back pain; 8/10: 29/56; 10/5: 38/56    Time 8    Period Weeks    Status Not Met    Target Date 08/26/20      PT LONG TERM GOAL #3   Title Pt. able to ambulate 100 feet with consistent 2 point gait pattern and use of least restrictive assistive devce to improve household mobility.    Baseline Pt. able to ambulate with use of RW/ CGA to min. A for safety.   Heavy use of B UE.; 9/30 pt able to ambulate with rollator with inconsistent 2 point gait pattern, heavy reliance on UE CGA/supervision  2/9: Pt. uses 2 point step pattern with walking with rollader with decreased stride length. Pt. would be limited due to pain in the knee walking for >100 ft.; 8/10: Pt demonstrates ability to ambulate 100' with consistent 2 point gait pattern with rollator.    Time 0    Period Weeks    Status Achieved      PT LONG TERM GOAL #4   Title Pt. able to stand from normal chair with no UE assist to improve safety/independence with transfers.     Baseline Unable to stand from standard chair without heavy UE assist. 10/6, pt unable to stand from standard chair without UE support.  11/10: benefits from 1 UE assist    2/9: Pt. can rise from normal chair with 1 UE assist and must control descent with UE assist.; 8/10: Pt requires 2 UE for standing from standard chair height.; 10/5: Requires single UE to stand.    Time 8    Period Weeks    Status Partially Met    Target Date 07/29/20      PT LONG TERM GOAL #5   Title Pt. will perform a sit to stand with 1 UE support to be able to perform toileting activities with more independance.     Baseline Pt. requires 2 UE to push from to sit to stand from standard chair height.  Pt. has grab rails available but can not get up by pulling.; 8/10: pt requires 2 UE's to STS from standard chair height.; 10/5: Only required single UE support to stand. Use of momentum but able to perform independently.    Time 8    Period Weeks    Status Achieved    Target Date 07/01/20      PT LONG TERM GOAL #6   Title Pt. will ascend/ descend 4 steps with use of B handrails and step pattern with mod. independence safely to be able to enter son's house.    Baseline Step pattern with heavy UE assist and CGA/min. A for safety.; 8/10: Pt safely asc/desc 4 steps with B hand rails and step to pattern with mod indep to enter son's house safely.    Time 0    Period Weeks    Status Achieved      PT LONG TERM GOAL #7   Title Pt will be able to stand at bathroom sink for 5 min with no UE support to complete ADL's like brushing his teeth, washing his hands, and combing his hair.    Baseline 8/10: Ability to stand with no UE support for 2 min.; 10/5: 3.5 min due to LBP. Has demonstrated in previous PT sessions he can stand for 5 min.    Time 8    Period Weeks    Status Partially Met    Target Date 08/26/20      PT LONG TERM GOAL #8   Title Pt will demonstrate ability to stand from a lowered surface below 18" with single Ue support to demonstrate improved BLE strength to improve transfers from toilet.    Baseline 10/5: Able to stand from 18" chair with single UE support with use of momentum.    Time 8    Period Weeks    Status New    Target Date 08/26/20                 Plan - 07/15/20 1551    Clinical Impression Statement Pt. practiced independent standing and "pulling pants up" motion. Pt. would like to become more independent with standing and being able to pull his pants up after toileting. Pt. able to ambulate outdoors today with rollator over uneven pavement and over grassy terrain. Pt. will  continue to benefit from skilled PT to improve independence with ADLs.    Personal Factors and Comorbidities Age    Examination-Activity Limitations Toileting;Stand;Squat;Lift;Stairs    Stability/Clinical Decision Making Evolving/Moderate complexity    Clinical Decision Making Moderate    Rehab Potential Fair    PT Frequency 1x / week    PT Duration 8 weeks    PT Treatment/Interventions Moist Heat;Functional mobility training;Therapeutic activities;Therapeutic exercise;Balance training;Patient/family education;Manual techniques;Passive range of motion;Neuromuscular re-education;Stair training;Gait training;ADLs/Self Care Home Management    PT Next Visit Plan SLR, bridges    PT Home Exercise Plan [email or web address]    Consulted and Agree with Plan of Care Patient;Family member/caregiver    Family Member Consulted spouse: [name]           Patient will benefit from skilled therapeutic intervention in order to improve the following deficits and impairments:  Abnormal gait, Decreased endurance, Decreased activity tolerance, Pain, Decreased balance, Impaired flexibility, Decreased strength, Postural dysfunction  Visit Diagnosis: Muscle weakness (generalized)  Gait difficulty  Abnormal posture  Balance problem     Problem List Patient Active Problem List   Diagnosis Date Noted  . DVT femoral (deep venous thrombosis)  with thrombophlebitis, left ([place]) 02/06/2020  . Lymphedema 04/15/2019  . Bilateral leg edema 01/25/2019  . Hyponatremia 12/24/2018  . SIADH (syndrome of inappropriate ADH production) ([place]) 12/24/2018  . Erosion of urethra due to catheterization of urinary tract ([place]) 10/27/2018  . Generalized weakness 10/14/2018  . Hip fracture ([place]) 09/15/2018  . 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 HLD (hyperlipidemia) 03/24/2018  . Contusion of right knee 02/14/2018  . Bradycardia 12/28/2017   . 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 Tremor 09/04/2016  . Chronic pain of right knee 08/10/2016  . Right ankle pain 08/10/2016  . 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  . 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[place]) 12/11/2015  . Urinary retention 09/08/2015  . Lymphoma, 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[place]) 04/03/2015  . Breathlessness on exertion 11/21/2014  . Breath shortness 11/21/2014  . Arthropathy 11/07/2014  . Atrial flutter, paroxysmal ([place]) 11/07/2014  . Type 2 diabetes mellitus ([place]) 11/07/2014  . Benign essential tremor 11/07/2014  . Benign essential HTN 11/07/2014  . Mixed incontinence 11/07/2014  . Hypercholesterolemia without hypertriglyceridemia 11/07/2014  . Apnea, sleep 11/07/2014  . Controlled type 2 diabetes mellitus without complication ([place]) 87/19/5974  . Pure hypercholesterolemia 11/07/2014  . Other abnormalities of gait and mobility 11/02/2011  . Decreased mobility 11/02/2011  . Abnormal gait 06/29/2011  . Discoordination 06/29/2011    Carlyle Basques, SPT 07/15/2020, 3:57 PM  [facility] [address] [place], [place], [address] Phone: [phone]   Fax:  [phone]  Name: Trei Schoch MRN: [id number] Date of Birth: 12-Jun-1941

## 2020-07-22 ENCOUNTER — Encounter: Payer: Self-pay | Admitting: Physical Therapy

## 2020-07-22 ENCOUNTER — Other Ambulatory Visit: Payer: Self-pay

## 2020-07-22 ENCOUNTER — Ambulatory Visit: Payer: Medicare Other

## 2020-07-22 DIAGNOSIS — M6281 Muscle weakness (generalized): Secondary | ICD-10-CM | POA: Diagnosis not present

## 2020-07-22 DIAGNOSIS — R2689 Other abnormalities of gait and mobility: Secondary | ICD-10-CM

## 2020-07-22 DIAGNOSIS — R269 Unspecified abnormalities of gait and mobility: Secondary | ICD-10-CM

## 2020-07-22 DIAGNOSIS — R293 Abnormal posture: Secondary | ICD-10-CM

## 2020-07-22 NOTE — Therapy (Signed)
[place] [facility] [address] [place], [place], [address] Phone: [phone]   Fax:  [phone]  Physical Therapy Treatment  Patient Details  Name: Mike Holloway MRN: [id number] Date of Birth: [birth year] Referring Provider (PT): Dr. Rudene Christians   Encounter Date: 07/22/2020   PT End of Session - 07/22/20 1545    Visit Number 7    Number of Visits 88    Date for PT Re-Evaluation 08/26/20    Authorization - Visit Number 12    Authorization - Number of Visits 18    PT Start Time 3007    PT Stop Time 6226    PT Time Calculation (min) 49 min    Equipment Utilized During Treatment Gait belt    Activity Tolerance Patient tolerated treatment well;Patient limited by fatigue;No increased pain    Behavior During Therapy WFL for tasks assessed/performed           Past Medical History:  Diagnosis Date   Arthritis    Atrial flutter ([place])    Diabetes mellitus ([place])    Essential tremor    Essential tremor    deep brain stimulator    Hypercholesteremia    Hypertension    Incontinence    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   SIADH (syndrome of inappropriate ADH production) ([place])    Sleep apnea    Stroke [facility])     Past Surgical History:  Procedure Laterality Date   ABLATION     APPENDECTOMY     CATARACT EXTRACTION, BILATERAL     COLONOSCOPY WITH PROPOFOL N/A 03/17/2018   Procedure: COLONOSCOPY WITH PROPOFOL;  Surgeon: Jonathon Bellows, MD;  Location: [facility] 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   Procedure: FECAL TRANSPLANT;  Surgeon: Jonathon Bellows, MD;  Location: [facility] ENDOSCOPY;  Service: Gastroenterology;  Laterality: N/A;   HEMORRHOID SURGERY     HERNIA REPAIR     HIP FRACTURE SURGERY     INTRAMEDULLARY (IM) NAIL INTERTROCHANTERIC Right 09/16/2018   Procedure: INTRAMEDULLARY (IM) NAIL INTERTROCHANTRIC;  Surgeon:  Hessie Knows, MD;  Location: [facility] ORS;  Service: Orthopedics;  Laterality: Right;   IR CATHETER TUBE CHANGE  11/22/2018   NASAL SINUS SURGERY     ORCHIECTOMY     TONSILLECTOMY      There were no vitals filed for this visit.   Subjective Assessment - 07/22/20 1358    Subjective Pt reports R knee pain at 1/10 NPS. Spouse states pt is now on Voltaren due to elevated GFR. No falls and has been walking at home.    Patient is accompained by: Family member    Pertinent History Pt. states R knee is bothering him but it is the typical discomfort he experiences    Limitations House hold activities;Walking;Standing;Lifting    Patient Stated Goals Improve LE strength/ gait and balance with daily tasks.  Improve gait/ safety/ progression to least assistive device.    Currently in Pain? Yes    Pain Score 1     Pain Location Knee    Pain Orientation Right    Pain Descriptors / Indicators Aching    Pain Type Chronic pain    Pain Onset More than a month ago    Pain Frequency Intermittent    Pain Onset More than a month ago          Vitals prior to PT:  BP: 164/70 mm Hg, HR: 63 BPM.   2 min after seated rest: 137/72 mm Hg    There.ex:  STS form blue mat at lowered surface (18") no UE support: 1x10. CGA+1 with verbal cues to increase ant shift. PT required use of momentum and intermittent multiple attempts before standing.   STS from blue mat at 18" no UE support with 4 lbs med ball: 1x10, CGA+1, improved weight shift with ball.    Vitals reassessed after STS: 158/76 mm Hg   B resisted lat lumbar flexion with green TB: 1x10/side to improve balance and strength with pulling pants on in standing. CGA+1. Increased difficulty with resisted lat flex to R > L.  Mini squats in // bars with BUE support: 2x12. Mod verbal cues for form/technique. Use of chair behind pt to encourage improved hip hinge with fair carryover. CGA+1.     Frequent seated rest breaks throughout treatment.   PT  Education - 07/22/20 1544    Education Details form/technique with exercise.    Person(s) Educated Patient    Methods Explanation;Demonstration;Tactile cues;Verbal cues    Comprehension Verbalized understanding;Returned demonstration               PT Long Term Goals - 07/01/20 1719      PT LONG TERM GOAL #1   Title Pt. independent with HEP to increase B hip flexion/ R quad muscle strength 1/2 muscle grade to improve pain-free mobility.    Baseline R knee extension limited to 4/5 MMT secondary to pain/ fear of pain. 2/9: Deferred.  4/13: 4+/5 MMT (max potential).    Time 4    Period Weeks    Status Achieved      PT LONG TERM GOAL #2   Title Pt. will increase Berg balance test to >40 out of 56 to improve independence with gait/ decrease fall risk.    Baseline Berg: 22/56 (significant fall risk); 9/30 32/56.  11/10: 32 (limited by R knee pain)  2/9: 33/56 limited due to R knee and Low Back pain; 8/10: 29/56; 10/5: 38/56    Time 8    Period Weeks    Status Not Met    Target Date 08/26/20      PT LONG TERM GOAL #3   Title Pt. able to ambulate 100 feet with consistent 2 point gait pattern and use of least restrictive assistive devce to improve household mobility.    Baseline Pt. able to ambulate with use of RW/ CGA to min. A for safety.   Heavy use of B UE.; 9/30 pt able to ambulate with rollator with inconsistent 2 point gait pattern, heavy reliance on UE CGA/supervision  2/9: Pt. uses 2 point step pattern with walking with rollader with decreased stride length. Pt. would be limited due to pain in the knee walking for >100 ft.; 8/10: Pt demonstrates ability to ambulate 100' with consistent 2 point gait pattern with rollator.    Time 0    Period Weeks    Status Achieved      PT LONG TERM GOAL #4   Title Pt. able to stand from normal chair with no UE assist to improve safety/independence with transfers.     Baseline Unable to stand from standard chair without heavy UE assist. 10/6, pt  unable to stand from standard chair without UE support.  11/10: benefits from 1 UE assist    2/9: Pt. can rise from normal chair with 1 UE assist and must control descent with UE assist.;  8/10: Pt requires 2 UE for standing from standard chair height.; 10/5: Requires single UE to stand.    Time 8    Period Weeks    Status Partially Met    Target Date 07/29/20      PT LONG TERM GOAL #5   Title Pt. will perform a sit to stand with 1 UE support to be able to perform toileting activities with more independance.    Baseline Pt. requires 2 UE to push from to sit to stand from standard chair height. Pt. has grab rails available but can not get up by pulling.; 8/10: pt requires 2 UE's to STS from standard chair height.; 10/5: Only required single UE support to stand. Use of momentum but able to perform independently.    Time 8    Period Weeks    Status Achieved    Target Date 07/01/20      PT LONG TERM GOAL #6   Title Pt. will ascend/ descend 4 steps with use of B handrails and step pattern with mod. independence safely to be able to enter son's house.    Baseline Step pattern with heavy UE assist and CGA/min. A for safety.; 8/10: Pt safely asc/desc 4 steps with B hand rails and step to pattern with mod indep to enter son's house safely.    Time 0    Period Weeks    Status Achieved      PT LONG TERM GOAL #7   Title Pt will be able to stand at bathroom sink for 5 min with no UE support to complete ADL's like brushing his teeth, washing his hands, and combing his hair.    Baseline 8/10: Ability to stand with no UE support for 2 min.; 10/5: 3.5 min due to LBP. Has demonstrated in previous PT sessions he can stand for 5 min.    Time 8    Period Weeks    Status Partially Met    Target Date 08/26/20      PT LONG TERM GOAL #8   Title Pt will demonstrate ability to stand from a lowered surface below 18" with single Ue support to demonstrate improved BLE strength to improve transfers from toilet.     Baseline 10/5: Able to stand from 18" chair with single UE support with use of momentum.    Time 8    Period Weeks    Status New    Target Date 08/26/20                 Plan - 07/22/20 1554    Clinical Impression Statement Vitals monitored throughout session due to pt reports of new BP med added (voltaren). Seated in clinic BP via LUE was read at 164/70 mm Hg. After bouts of balance and light resistance exercise BP readings improved to 137/72 mm Hg and 158/76 mm Hg. Pt educated to monitor vitals daily and if any significant elevated BP > 878 mm Hg systolic then contact PCP. PT verbalized understanding. Pt can continue to benefit from further skilled PT treatment to improve his ability to perform ADL's with more independence and reduce risk of falls.    Personal Factors and Comorbidities Age    Examination-Activity Limitations Toileting;Stand;Squat;Lift;Stairs    Stability/Clinical Decision Making Evolving/Moderate complexity    Clinical Decision Making Moderate    Rehab Potential Fair    PT Frequency 1x / week    PT Duration 8 weeks    PT Treatment/Interventions Moist Heat;Functional mobility training;Therapeutic activities;Therapeutic exercise;Balance  training;Patient/family education;Manual techniques;Passive range of motion;Neuromuscular re-education;Stair training;Gait training;ADLs/Self Care Home Management    PT Next Visit Plan Mini squats, lunges, Supine exercises for BLE's.    PT Home Exercise Plan [email or web address]    Consulted and Agree with Plan of Care Patient;Family member/caregiver    Family Member Consulted spouse: [name]           Patient will benefit from skilled therapeutic intervention in order to improve the following deficits and impairments:  Abnormal gait, Decreased endurance, Decreased activity tolerance, Pain, Decreased balance, Impaired flexibility, Decreased strength, Postural dysfunction  Visit Diagnosis: Muscle weakness (generalized)  Gait  difficulty  Abnormal posture  Balance problem     Problem List Patient Active Problem List   Diagnosis Date Noted   DVT femoral (deep venous thrombosis) with thrombophlebitis, left (HCC) 02/06/2020   Lymphedema 04/15/2019   Bilateral leg edema 01/25/2019   Hyponatremia 12/24/2018   SIADH (syndrome of inappropriate ADH production) ([place]) 12/24/2018   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) 10/27/2018   Generalized weakness 10/14/2018   Hip fracture ([place]) 09/15/2018   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   HLD (hyperlipidemia) 03/24/2018   Contusion of right knee 02/14/2018   Bradycardia 12/28/2017   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   Tremor 09/04/2016   Chronic pain of right knee 08/10/2016   Right ankle pain 08/10/2016   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[place]) 12/11/2015   Urinary retention 09/08/2015   Lymphoma, 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[place]) 04/03/2015   Breathlessness on exertion 11/21/2014   Breath shortness 11/21/2014   Arthropathy 11/07/2014   Atrial flutter, paroxysmal ([place]) 11/07/2014   Type 2 diabetes mellitus ([place]) 11/07/2014   Benign essential tremor 11/07/2014   Benign essential HTN 11/07/2014   Mixed incontinence 11/07/2014   Hypercholesterolemia without hypertriglyceridemia 11/07/2014   Apnea, sleep 11/07/2014   Controlled type 2 diabetes mellitus without complication ([place]) 76/15/1834   Pure hypercholesterolemia 11/07/2014   Other abnormalities of gait and mobility 11/02/2011   Decreased mobility 11/02/2011   Abnormal gait 06/29/2011   Discoordination 06/29/2011    Larna Daughters, SPT 07/22/2020, 5:08 PM  [place] [facility] [address] [place], [place], [address] Phone: [phone]   Fax:   [phone]  Name: Gianlucca Szymborski MRN: [id number] Date of Birth: May 01, 1941

## 2020-07-29 ENCOUNTER — Encounter: Payer: Self-pay | Admitting: Physical Therapy

## 2020-07-29 ENCOUNTER — Other Ambulatory Visit: Payer: Self-pay

## 2020-07-29 ENCOUNTER — Ambulatory Visit: Payer: Medicare Other | Attending: Orthopedic Surgery | Admitting: Physical Therapy

## 2020-07-29 DIAGNOSIS — R293 Abnormal posture: Secondary | ICD-10-CM

## 2020-07-29 DIAGNOSIS — M6281 Muscle weakness (generalized): Secondary | ICD-10-CM

## 2020-07-29 DIAGNOSIS — R2689 Other abnormalities of gait and mobility: Secondary | ICD-10-CM | POA: Insufficient documentation

## 2020-07-29 DIAGNOSIS — R269 Unspecified abnormalities of gait and mobility: Secondary | ICD-10-CM

## 2020-07-29 NOTE — Therapy (Signed)
[place] [facility] [address] [place], [place], [address] Phone: [phone]   Fax:  [phone]  Physical Therapy Treatment  Patient Details  Name: Mike Holloway MRN: [id number] Date of Birth: 06-03-1941 Referring Provider (PT): Dr. Rudene Christians   Encounter Date: 07/29/2020   PT End of Session - 07/29/20 1350    Visit Number 36    Number of Visits 43    Date for PT Re-Evaluation 08/26/20    Authorization - Visit Number 5    Authorization - Number of Visits 10    PT Start Time 1350    PT Stop Time 1440    PT Time Calculation (min) 50 min    Equipment Utilized During Treatment Gait belt    Activity Tolerance Patient tolerated treatment well;Patient limited by fatigue;No increased pain    Behavior During Therapy WFL for tasks assessed/performed           Past Medical History:  Diagnosis Date  . Arthritis   . Atrial flutter ([place])   . Diabetes mellitus ([place])   . Essential tremor   . Essential tremor    deep brain stimulator   . Hypercholesteremia   . Hypertension   . Incontinence   . 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 SIADH (syndrome of inappropriate ADH production) ([place])   . Sleep apnea   . Stroke [facility])     Past Surgical History:  Procedure Laterality Date  . ABLATION    . APPENDECTOMY    . CATARACT EXTRACTION, BILATERAL    . COLONOSCOPY WITH PROPOFOL N/A 03/17/2018   Procedure: COLONOSCOPY WITH PROPOFOL;  Surgeon: Jonathon Bellows, MD;  Location: [facility] ENDOSCOPY;  Service: Gastroenterology;  Laterality: N/A;  . DEEP BRAIN STIMULATOR PLACEMENT    . 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 HEMORRHOID SURGERY    . HERNIA REPAIR    . HIP FRACTURE SURGERY    . INTRAMEDULLARY (IM) NAIL INTERTROCHANTERIC Right 09/16/2018   Procedure: INTRAMEDULLARY (IM) NAIL INTERTROCHANTRIC;  Surgeon:  Hessie Knows, MD;  Location: [facility] ORS;  Service: Orthopedics;  Laterality: Right;  . IR CATHETER TUBE CHANGE  11/22/2018  . NASAL SINUS SURGERY    . ORCHIECTOMY    . TONSILLECTOMY      There were no vitals filed for this visit.   Subjective Assessment - 07/29/20 1351    Subjective Pt. states that his R knee is about 2/10 NPS. Pt. states he has put biofreeze on his R knee before coming.    Patient is accompained by: Family member    Pertinent History Pt. states R knee is bothering him but it is the typical discomfort he experiences    Limitations House hold activities;Walking;Standing;Lifting    Patient Stated Goals Improve LE strength/ gait and balance with daily tasks.  Improve gait/ safety/ progression to least assistive device.    Currently in Pain? Yes    Pain Score 2     Pain Location Knee    Pain Orientation Right    Pain Descriptors / Indicators Aching    Pain Onset More than a month ago    Pain Onset More than a month ago            BP: 133/62    Education/discussion with patient and caregiver on options for success with pulling pants up at home after bathroom.  Pt is uncomfortable with pulling pants up from ankles, would like to find solution so pants can stay around knees.   There Ex:  Standing lateral flexion to knees: 5x each side, CGA. Seated rest break after  STS practice from blue mat, initially 22", reduced to 19": 15x total. Pt instructed to reach for pants with both hands and "hold pants" while he performs STS. Pt required verbal and tactile cueing for increasing forward lean during STS. Pt also attempted to stand with one hand "holding" pants and one hand on rollator.  Forward/backwards walking in // bars: 1x with double hand support on bars, 1x with one hand on // bars and CGA plus 1 PT in front blocking knees, CGA. Pt experienced increased pain and fatigue, therefore discontinued.  Frequent sit breaks throughout session  Nustep L4 10 mins (unbilled)        PT Long Term Goals - 07/01/20 1719      PT LONG TERM GOAL #1   Title Pt. independent with HEP to increase B hip flexion/ R quad muscle strength 1/2 muscle grade to improve pain-free mobility.    Baseline R knee extension limited to 4/5 MMT secondary to pain/ fear of pain. 2/9: Deferred.  4/13: 4+/5 MMT (max potential).    Time 4    Period Weeks    Status Achieved      PT LONG TERM GOAL #2   Title Pt. will increase Berg balance test to >40 out of 56 to improve independence with gait/ decrease fall risk.    Baseline Berg: 22/56 (significant fall risk); 9/30 32/56.  11/10: 32 (limited by R knee pain)  2/9: 33/56 limited due to R knee and Low Back pain; 8/10: 29/56; 10/5: 38/56    Time 8    Period Weeks    Status Not Met    Target Date 08/26/20      PT LONG TERM GOAL #3   Title Pt. able to ambulate 100 feet with consistent 2 point gait pattern and use of least restrictive assistive devce to improve household mobility.    Baseline Pt. able to ambulate with use of RW/ CGA to min. A for safety.   Heavy use of B UE.; 9/30 pt able to ambulate with rollator with inconsistent 2 point gait pattern, heavy reliance on UE CGA/supervision  2/9: Pt. uses 2 point step pattern with walking with rollader with decreased stride length. Pt. would be limited due to pain in the knee walking for >100 ft.; 8/10: Pt demonstrates ability to ambulate 100' with consistent 2 point gait pattern with rollator.    Time 0    Period Weeks    Status Achieved      PT LONG TERM GOAL #4   Title Pt. able to stand from normal chair with no UE assist to improve safety/independence with transfers.     Baseline Unable to stand from standard chair without heavy UE assist. 10/6, pt unable to stand from standard chair without UE support.  11/10: benefits from 1 UE assist    2/9: Pt. can rise from normal chair with 1 UE assist and must control descent with UE assist.; 8/10: Pt requires 2 UE for standing from standard chair  height.; 10/5: Requires single UE to stand.    Time 8    Period Weeks    Status Partially Met    Target Date 07/29/20      PT LONG TERM GOAL #5   Title Pt. will perform a sit  to stand with 1 UE support to be able to perform toileting activities with more independance.    Baseline Pt. requires 2 UE to push from to sit to stand from standard chair height. Pt. has grab rails available but can not get up by pulling.; 8/10: pt requires 2 UE's to STS from standard chair height.; 10/5: Only required single UE support to stand. Use of momentum but able to perform independently.    Time 8    Period Weeks    Status Achieved    Target Date 07/01/20      PT LONG TERM GOAL #6   Title Pt. will ascend/ descend 4 steps with use of B handrails and step pattern with mod. independence safely to be able to enter son's house.    Baseline Step pattern with heavy UE assist and CGA/min. A for safety.; 8/10: Pt safely asc/desc 4 steps with B hand rails and step to pattern with mod indep to enter son's house safely.    Time 0    Period Weeks    Status Achieved      PT LONG TERM GOAL #7   Title Pt will be able to stand at bathroom sink for 5 min with no UE support to complete ADL's like brushing his teeth, washing his hands, and combing his hair.    Baseline 8/10: Ability to stand with no UE support for 2 min.; 10/5: 3.5 min due to LBP. Has demonstrated in previous PT sessions he can stand for 5 min.    Time 8    Period Weeks    Status Partially Met    Target Date 08/26/20      PT LONG TERM GOAL #8   Title Pt will demonstrate ability to stand from a lowered surface below 18" with single Ue support to demonstrate improved BLE strength to improve transfers from toilet.    Baseline 10/5: Able to stand from 18" chair with single UE support with use of momentum.    Time 8    Period Weeks    Status New    Target Date 08/26/20                 Plan - 07/29/20 1435    Clinical Impression Statement Pt.  returns to therapy with increased R knee pain, 2/10. BP taken at end of session, 133/62. Focus of session today was finding a way for him to successfully pull up pants without caregiver assistance. Pt was able to "pull up pants" while sitting by reaching down towards ankles and completing STS while "holding pants" at knees. Pt. was successful at completing multiple reps with this technique, and was more successful with "holding pants" with one hand and using other hand on rollator to come to standing. Pt. demonstrated ability to walk in // bars with one hand support with CGA behind and therapist in front CGA at knees. Pt. will continue to benefit from skilled PT to improve functional independence at home with ADLs.    Personal Factors and Comorbidities Age    Examination-Activity Limitations Toileting;Stand;Squat;Lift;Stairs    Stability/Clinical Decision Making Evolving/Moderate complexity    Clinical Decision Making Moderate    Rehab Potential Fair    PT Frequency 1x / week    PT Duration 8 weeks    PT Treatment/Interventions Moist Heat;Functional mobility training;Therapeutic activities;Therapeutic exercise;Balance training;Patient/family education;Manual techniques;Passive range of motion;Neuromuscular re-education;Stair training;Gait training;ADLs/Self Care Home Management    PT Next Visit Plan Mini squats, lunges, Supine exercises for  BLE's.    PT Home Exercise Plan [email or web address]    Consulted and Agree with Plan of Care Patient;Family member/caregiver    Family Member Consulted spouse: [name]           Patient will benefit from skilled therapeutic intervention in order to improve the following deficits and impairments:  Abnormal gait, Decreased endurance, Decreased activity tolerance, Pain, Decreased balance, Impaired flexibility, Decreased strength, Postural dysfunction  Visit Diagnosis: Muscle weakness (generalized)  Gait difficulty  Abnormal posture  Balance problem     Problem  List Patient Active Problem List   Diagnosis Date Noted  . DVT femoral (deep venous thrombosis) with thrombophlebitis, left ([place]) 02/06/2020  . Lymphedema 04/15/2019  . Bilateral leg edema 01/25/2019  . Hyponatremia 12/24/2018  . SIADH (syndrome of inappropriate ADH production) ([place]) 12/24/2018  . Erosion of urethra due to catheterization of urinary tract ([place]) 10/27/2018  . Generalized weakness 10/14/2018  . Hip fracture ([place]) 09/15/2018  . 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 HLD (hyperlipidemia) 03/24/2018  . Contusion of right knee 02/14/2018  . Bradycardia 12/28/2017  . 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 Tremor 09/04/2016  . Chronic pain of right knee 08/10/2016  . Right ankle pain 08/10/2016  . 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  . 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[place]) 12/11/2015  . Urinary retention 09/08/2015  . Lymphoma, 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[place]) 04/03/2015  . Breathlessness on exertion 11/21/2014  . Breath shortness 11/21/2014  . Arthropathy 11/07/2014  . Atrial flutter, paroxysmal ([place]) 11/07/2014  . Type 2 diabetes mellitus ([place]) 11/07/2014  . Benign essential tremor 11/07/2014  . Benign essential HTN 11/07/2014  . Mixed incontinence 11/07/2014  . Hypercholesterolemia without hypertriglyceridemia 11/07/2014  . Apnea, sleep 11/07/2014  . Controlled type 2 diabetes mellitus without complication ([place]) 56/43/3295  . Pure hypercholesterolemia 11/07/2014  . Other abnormalities of gait and mobility 11/02/2011  . Decreased mobility 11/02/2011  . Abnormal gait 06/29/2011  . Discoordination 06/29/2011   Pura Spice, PT, DPT # [id number] Carlyle Basques, SPT 07/29/2020, 5:11 PM  [place] [facility] [address] [place], [place], [address] Phone: [phone]   Fax:  [phone]  Name: Mike Holloway MRN:  [id number] Date of Birth: March 25, 1941

## 2020-08-05 ENCOUNTER — Encounter: Payer: Self-pay | Admitting: Physical Therapy

## 2020-08-05 ENCOUNTER — Other Ambulatory Visit: Payer: Self-pay

## 2020-08-05 ENCOUNTER — Ambulatory Visit: Payer: Medicare Other | Admitting: Physical Therapy

## 2020-08-05 VITALS — BP 151/83

## 2020-08-05 DIAGNOSIS — M6281 Muscle weakness (generalized): Secondary | ICD-10-CM | POA: Diagnosis not present

## 2020-08-05 DIAGNOSIS — R293 Abnormal posture: Secondary | ICD-10-CM

## 2020-08-05 DIAGNOSIS — R2689 Other abnormalities of gait and mobility: Secondary | ICD-10-CM

## 2020-08-05 DIAGNOSIS — R269 Unspecified abnormalities of gait and mobility: Secondary | ICD-10-CM

## 2020-08-05 NOTE — Therapy (Signed)
[facility] [address] [place], [place], [address] Phone: [phone]   Fax:  [phone]  Physical Therapy Treatment  Patient Details  Name: Mike Holloway MRN: [id number] Date of Birth: 03/25/1941 Referring Provider (PT): Dr. Rudene Christians   Encounter Date: 08/05/2020   PT End of Session - 08/05/20 1345    Visit Number 36    Number of Visits 42    Date for PT Re-Evaluation 08/26/20    Authorization - Visit Number 6    Authorization - Number of Visits 10    PT Start Time 9563    PT Stop Time 1433    PT Time Calculation (min) 48 min    Equipment Utilized During Treatment Gait belt    Activity Tolerance Patient tolerated treatment well;Patient limited by fatigue;No increased pain    Behavior During Therapy WFL for tasks assessed/performed           Past Medical History:  Diagnosis Date  . Arthritis   . Atrial flutter ([place])   . Diabetes mellitus ([place])   . Essential tremor   . Essential tremor    deep brain stimulator   . Hypercholesteremia   . Hypertension   . Incontinence   . 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[place])    grew on the testical  . SIADH (syndrome of inappropriate ADH production) ([place])   . Sleep apnea   . Stroke [facility])     Past Surgical History:  Procedure Laterality Date  . ABLATION    . APPENDECTOMY    . CATARACT EXTRACTION, BILATERAL    . COLONOSCOPY WITH PROPOFOL N/A 03/17/2018   Procedure: COLONOSCOPY WITH PROPOFOL;  Surgeon: Jonathon Bellows, MD;  Location: [facility] ENDOSCOPY;  Service: Gastroenterology;  Laterality: N/A;  . DEEP BRAIN STIMULATOR PLACEMENT    . 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 HEMORRHOID SURGERY    . HERNIA REPAIR    . HIP FRACTURE SURGERY    . INTRAMEDULLARY (IM) NAIL INTERTROCHANTERIC Right 09/16/2018   Procedure: INTRAMEDULLARY (IM) NAIL INTERTROCHANTRIC;  Surgeon:  Hessie Knows, MD;  Location: [facility] ORS;  Service: Orthopedics;  Laterality: Right;  . IR CATHETER TUBE CHANGE  11/22/2018  . NASAL SINUS SURGERY    . ORCHIECTOMY    . TONSILLECTOMY      Vitals:   08/05/20 1351  BP: (!) 151/83     Subjective Assessment - 08/05/20 1348    Subjective Pt. returns to therapy with new BP monitor on wrist. Pt. states his BP has been doing. Pt. states his R knee is doing about the same.    Patient is accompained by: Family member    Pertinent History Pt. states R knee is bothering him but it is the typical discomfort he experiences    Limitations House hold activities;Walking;Standing;Lifting    Patient Stated Goals Improve LE strength/ gait and balance with daily tasks.  Improve gait/ safety/ progression to least assistive device.    Currently in Pain? Yes    Pain Score 2     Pain Location Knee    Pain Orientation Right    Pain Descriptors / Indicators Aching    Pain Onset More than a month ago    Pain Onset More than a month ago              BP monitored throughout session:  Initial: 151/76mHg  After approx 5 mins  sitting: 144/18mHg  30 mins into session: 153/8101mg, reduced to 145/88 mmHg after 3 mins  Supine position at end of session: 134/67 mmHg  Many seated rest breaks to allow for reduction of BP.   There Ex:  STS "pulling up pants" exercise: 5x, R hand on rollator, L hand on knee pushing to stand. Pt able to maintain balance throughout all trials.   Supine bridges from red bolster: 1x10, followed 1x20.   Neuro Re-ed:   // bars: -5x marching with double hand support, cueing to decrease upper extremity support -3x backwards walking with double hand support, cueing to improve upright posture and decrease hand support.          PT Long Term Goals - 07/01/20 1719      PT LONG TERM GOAL #1   Title Pt. independent with HEP to increase B hip flexion/ R quad muscle strength 1/2 muscle grade to improve pain-free mobility.     Baseline R knee extension limited to 4/5 MMT secondary to pain/ fear of pain. 2/9: Deferred.  4/13: 4+/5 MMT (max potential).    Time 4    Period Weeks    Status Achieved      PT LONG TERM GOAL #2   Title Pt. will increase Berg balance test to >40 out of 56 to improve independence with gait/ decrease fall risk.    Baseline Berg: 22/56 (significant fall risk); 9/30 32/56.  11/10: 32 (limited by R knee pain)  2/9: 33/56 limited due to R knee and Low Back pain; 8/10: 29/56; 10/5: 38/56    Time 8    Period Weeks    Status Not Met    Target Date 08/26/20      PT LONG TERM GOAL #3   Title Pt. able to ambulate 100 feet with consistent 2 point gait pattern and use of least restrictive assistive devce to improve household mobility.    Baseline Pt. able to ambulate with use of RW/ CGA to min. A for safety.   Heavy use of B UE.; 9/30 pt able to ambulate with rollator with inconsistent 2 point gait pattern, heavy reliance on UE CGA/supervision  2/9: Pt. uses 2 point step pattern with walking with rollader with decreased stride length. Pt. would be limited due to pain in the knee walking for >100 ft.; 8/10: Pt demonstrates ability to ambulate 100' with consistent 2 point gait pattern with rollator.    Time 0    Period Weeks    Status Achieved      PT LONG TERM GOAL #4   Title Pt. able to stand from normal chair with no UE assist to improve safety/independence with transfers.     Baseline Unable to stand from standard chair without heavy UE assist. 10/6, pt unable to stand from standard chair without UE support.  11/10: benefits from 1 UE assist    2/9: Pt. can rise from normal chair with 1 UE assist and must control descent with UE assist.; 8/10: Pt requires 2 UE for standing from standard chair height.; 10/5: Requires single UE to stand.    Time 8    Period Weeks    Status Partially Met    Target Date 07/29/20      PT LONG TERM GOAL #5   Title Pt. will perform a sit to stand with 1 UE support to be  able to perform toileting activities with more independance.    Baseline Pt. requires 2 UE to push from to sit to  stand from standard chair height. Pt. has grab rails available but can not get up by pulling.; 8/10: pt requires 2 UE's to STS from standard chair height.; 10/5: Only required single UE support to stand. Use of momentum but able to perform independently.    Time 8    Period Weeks    Status Achieved    Target Date 07/01/20      PT LONG TERM GOAL #6   Title Pt. will ascend/ descend 4 steps with use of B handrails and step pattern with mod. independence safely to be able to enter son's house.    Baseline Step pattern with heavy UE assist and CGA/min. A for safety.; 8/10: Pt safely asc/desc 4 steps with B hand rails and step to pattern with mod indep to enter son's house safely.    Time 0    Period Weeks    Status Achieved      PT LONG TERM GOAL #7   Title Pt will be able to stand at bathroom sink for 5 min with no UE support to complete ADL's like brushing his teeth, washing his hands, and combing his hair.    Baseline 8/10: Ability to stand with no UE support for 2 min.; 10/5: 3.5 min due to LBP. Has demonstrated in previous PT sessions he can stand for 5 min.    Time 8    Period Weeks    Status Partially Met    Target Date 08/26/20      PT LONG TERM GOAL #8   Title Pt will demonstrate ability to stand from a lowered surface below 18" with single Ue support to demonstrate improved BLE strength to improve transfers from toilet.    Baseline 10/5: Able to stand from 18" chair with single UE support with use of momentum.    Time 8    Period Weeks    Status New    Target Date 08/26/20                 Plan - 08/05/20 1423    Clinical Impression Statement Pt. returns to therapy with slightly elevated BP today. Pt. began session with BP at 151/61mHg. Rested a few minutes, BP lowered to 144/79 mmHg. After standing marches and backewards walking, BP raised to 153/823mg. Pt.  required many seated rest breaks to allow BP to return to normal. Pt. ended sesion with supine bridges, BP measured in this position at 134/67 mmHg. Pt. instructed on completing pulling up pants activity by pulling pants up to knees in sitting, using L hand to keep pants at knees and R hand on rollator to push to standing while simultaneously pulling up pants. Pt. will continue to benefit from skilled PT to improve functional independence at home with ADLs.    Personal Factors and Comorbidities Age    Examination-Activity Limitations Toileting;Stand;Squat;Lift;Stairs    Stability/Clinical Decision Making Evolving/Moderate complexity    Clinical Decision Making Moderate    Rehab Potential Fair    PT Frequency 1x / week    PT Duration 8 weeks    PT Treatment/Interventions Moist Heat;Functional mobility training;Therapeutic activities;Therapeutic exercise;Balance training;Patient/family education;Manual techniques;Passive range of motion;Neuromuscular re-education;Stair training;Gait training;ADLs/Self Care Home Management    PT Next Visit Plan Mini squats, lunges, Supine exercises for BLE's.    PT Home Exercise Plan [id number]    Consulted and Agree with Plan of Care Patient;Family member/caregiver    Family Member Consulted spouse: PaFraser Din  Patient will benefit from skilled therapeutic intervention in order to improve the following deficits and impairments:  Abnormal gait, Decreased endurance, Decreased activity tolerance, Pain, Decreased balance, Impaired flexibility, Decreased strength, Postural dysfunction  Visit Diagnosis: Muscle weakness (generalized)  Gait difficulty  Abnormal posture  Balance problem     Problem List Patient Active Problem List   Diagnosis Date Noted  . DVT femoral (deep venous thrombosis) with thrombophlebitis, left ([place]) 02/06/2020  . Lymphedema 04/15/2019  . Bilateral leg edema 01/25/2019  . Hyponatremia 12/24/2018  . SIADH (syndrome of  inappropriate ADH production) ([place]) 12/24/2018  . Erosion of urethra due to catheterization of urinary tract ([place]) 10/27/2018  . Generalized weakness 10/14/2018  . Hip fracture ([place]) 09/15/2018  . 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 HLD (hyperlipidemia) 03/24/2018  . Contusion of right knee 02/14/2018  . Bradycardia 12/28/2017  . 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 Tremor 09/04/2016  . Chronic pain of right knee 08/10/2016  . Right ankle pain 08/10/2016  . 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  . 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[place]) 12/11/2015  . Urinary retention 09/08/2015  . Lymphoma, 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[place]) 04/03/2015  . Breathlessness on exertion 11/21/2014  . Breath shortness 11/21/2014  . Arthropathy 11/07/2014  . Atrial flutter, paroxysmal ([place]) 11/07/2014  . Type 2 diabetes mellitus ([place]) 11/07/2014  . Benign essential tremor 11/07/2014  . Benign essential HTN 11/07/2014  . Mixed incontinence 11/07/2014  . Hypercholesterolemia without hypertriglyceridemia 11/07/2014  . Apnea, sleep 11/07/2014  . Controlled type 2 diabetes mellitus without complication ([place]) 68/25/7493  . Pure hypercholesterolemia 11/07/2014  . Other abnormalities of gait and mobility 11/02/2011  . Decreased mobility 11/02/2011  . Abnormal gait 06/29/2011  . Discoordination 06/29/2011   Pura Spice, PT, DPT # [id number], SPT 08/05/2020, 3:04 PM  [place] [facility] [address] [place], [place], [address] Phone: [phone]   Fax:  [phone]  Name: Mike Holloway MRN: [id number] Date of Birth: 1941-09-20

## 2020-08-08 ENCOUNTER — Other Ambulatory Visit: Payer: Self-pay

## 2020-08-08 ENCOUNTER — Ambulatory Visit (INDEPENDENT_AMBULATORY_CARE_PROVIDER_SITE_OTHER): Payer: Medicare Other | Admitting: Physician Assistant

## 2020-08-08 DIAGNOSIS — R339 Retention of urine, unspecified: Secondary | ICD-10-CM | POA: Diagnosis not present

## 2020-08-08 NOTE — Patient Instructions (Signed)
Indwelling Urinary Catheter Care, Adult An indwelling urinary catheter is a thin tube that is put into your bladder. The tube helps to drain pee (urine) out of your body. The tube goes in through your urethra. Your urethra is where pee comes out of your body. Your pee will come out through the catheter, then it will go into a bag (drainage bag). Take good care of your catheter so it will work well. How to wear your catheter and bag Supplies needed  Sticky tape (adhesive tape) or a leg strap.  Alcohol wipe or soap and water (if you use tape).  A clean towel (if you use tape).  Large overnight bag.  Smaller bag (leg bag). Wearing your catheter Attach your catheter to your leg with tape or a leg strap.  Make sure the catheter is not pulled tight.  If a leg strap gets wet, take it off and put on a dry strap.  If you use tape to hold the bag on your leg: 1. Use an alcohol wipe or soap and water to wash your skin where the tape made it sticky before. 2. Use a clean towel to pat-dry that skin. 3. Use new tape to make the bag stay on your leg. Wearing your bags You should have been given a large overnight bag.  You may wear the overnight bag in the day or night.  Always have the overnight bag lower than your bladder.  Do not let the bag touch the floor.  Before you go to sleep, put a clean plastic bag in a wastebasket. Then hang the overnight bag inside the wastebasket. You should also have a smaller leg bag that fits under your clothes.  Always wear the leg bag below your knee.  Do not wear your leg bag at night. How to care for your skin and catheter Supplies needed  A clean washcloth.  Water and mild soap.  A clean towel. Caring for your skin and catheter      Clean the skin around your catheter every day: 1. Wash your hands with soap and water. 2. Wet a clean washcloth in warm water and mild soap. 3. Clean the skin around your urethra.  If you are  male:  Gently spread the folds of skin around your vagina (labia).  With the washcloth in your other hand, wipe the inner side of your labia on each side. Wipe from front to back.  If you are male:  Pull back any skin that covers the end of your penis (foreskin).  With the washcloth in your other hand, wipe your penis in small circles. Start wiping at the tip of your penis, then move away from the catheter.  Move the foreskin back in place, if needed. 4. With your free hand, hold the catheter close to where it goes into your body.  Keep holding the catheter during cleaning so it does not get pulled out. 5. With the washcloth in your other hand, clean the catheter.  Only wipe downward on the catheter.  Do not wipe upward toward your body. Doing this may push germs into your urethra and cause infection. 6. Use a clean towel to pat-dry the catheter and the skin around it. Make sure to wipe off all soap. 7. Wash your hands with soap and water.  Shower every day. Do not take baths.  Do not use cream, ointment, or lotion on the area where the catheter goes into your body, unless your doctor tells you   to.  Do not use powders, sprays, or lotions on your genital area.  Check your skin around the catheter every day for signs of infection. Check for: ? Redness, swelling, or pain. ? Fluid or blood. ? Warmth. ? Pus or a bad smell. How to empty the bag Supplies needed  Rubbing alcohol.  Gauze pad or cotton ball.  Tape or a leg strap. Emptying the bag Pour the pee out of your bag when it is ?- full, or at least 2-3 times a day. Do this for your overnight bag and your leg bag. 1. Wash your hands with soap and water. 2. Separate (detach) the bag from your leg. 3. Hold the bag over the toilet or a clean pail. Keep the bag lower than your hips and bladder. This is so the pee (urine) does not go back into the tube. 4. Open the pour spout. It is at the bottom of the bag. 5. Empty the  pee into the toilet or pail. Do not let the pour spout touch any surface. 6. Put rubbing alcohol on a gauze pad or cotton ball. 7. Use the gauze pad or cotton ball to clean the pour spout. 8. Close the pour spout. 9. Attach the bag to your leg with tape or a leg strap. 10. Wash your hands with soap and water. Follow instructions for cleaning the drainage bag:  From the product maker.  As told by your doctor. How to change the bag Supplies needed  Alcohol wipes.  A clean bag.  Tape or a leg strap. Changing the bag Replace your bag when it starts to leak, smell bad, or look dirty. 1. Wash your hands with soap and water. 2. Separate the dirty bag from your leg. 3. Pinch the catheter with your fingers so that pee does not spill out. 4. Separate the catheter tube from the bag tube where these tubes connect (at the connection valve). Do not let the tubes touch any surface. 5. Clean the end of the catheter tube with an alcohol wipe. Use a different alcohol wipe to clean the end of the bag tube. 6. Connect the catheter tube to the tube of the clean bag. 7. Attach the clean bag to your leg with tape or a leg strap. Do not make the bag tight on your leg. 8. Wash your hands with soap and water. General rules   Never pull on your catheter. Never try to take it out. Doing that can hurt you.  Always wash your hands before and after you touch your catheter or bag. Use a mild, fragrance-free soap. If you do not have soap and water, use hand sanitizer.  Always make sure there are no twists or bends (kinks) in the catheter tube.  Always make sure there are no leaks in the catheter or bag.  Drink enough fluid to keep your pee pale yellow.  Do not take baths, swim, or use a hot tub.  If you are male, wipe from front to back after you poop (have a bowel movement). Contact a doctor if:  Your pee is cloudy.  Your pee smells worse than usual.  Your catheter gets clogged.  Your catheter  leaks.  Your bladder feels full. Get help right away if:  You have redness, swelling, or pain where the catheter goes into your body.  You have fluid, blood, pus, or a bad smell coming from the area where the catheter goes into your body.  Your skin feels warm where   the catheter goes into your body.  You have a fever.  You have pain in your: ? Belly (abdomen). ? Legs. ? Lower back. ? Bladder.  You see blood in the catheter.  Your pee is pink or red.  You feel sick to your stomach (nauseous).  You throw up (vomit).  You have chills.  Your pee is not draining into the bag.  Your catheter gets pulled out. Summary  An indwelling urinary catheter is a thin tube that is placed into the bladder to help drain pee (urine) out of the body.  The catheter is placed into the part of the body that drains pee from the bladder (urethra).  Taking good care of your catheter will keep it working properly and help prevent problems.  Always wash your hands before and after touching your catheter or bag.  Never pull on your catheter or try to take it out. This information is not intended to replace advice given to you by your health care provider. Make sure you discuss any questions you have with your health care provider. Document Revised: 01/05/2019 Document Reviewed: 04/29/2017 Elsevier Patient Education  2020 [employer]  

## 2020-08-08 NOTE — Progress Notes (Signed)
Cath Change/ Replacement  Patient is present today for a catheter change due to urinary retention.  20ml of water was removed from the balloon, a 16FR coude foley cath was removed without difficulty.  Patient was cleaned and prepped in a sterile fashion with betadine and 2% lidocaine jelly was instilled into the urethra. A 16 FR coude foley cath was replaced into the bladder no complications were noted Urine return was noted 41ml and urine was yellow in color. The balloon was filled with 84ml of sterile water. A leg bag was attached for drainage.  A night bag was also given to the patient and patient was given instruction on how to change from one bag to another. Patient was given proper instruction on catheter care.    Performed by: Debroah Loop, PA-C   Follow up: Return in about 4 weeks (around 09/05/2020) for Catheter exchange.

## 2020-08-12 ENCOUNTER — Encounter: Payer: Self-pay | Admitting: Physical Therapy

## 2020-08-12 ENCOUNTER — Other Ambulatory Visit: Payer: Self-pay

## 2020-08-12 ENCOUNTER — Ambulatory Visit: Payer: Medicare Other | Admitting: Physical Therapy

## 2020-08-12 DIAGNOSIS — R269 Unspecified abnormalities of gait and mobility: Secondary | ICD-10-CM

## 2020-08-12 DIAGNOSIS — R2689 Other abnormalities of gait and mobility: Secondary | ICD-10-CM

## 2020-08-12 DIAGNOSIS — M6281 Muscle weakness (generalized): Secondary | ICD-10-CM | POA: Diagnosis not present

## 2020-08-12 DIAGNOSIS — R293 Abnormal posture: Secondary | ICD-10-CM

## 2020-08-12 NOTE — Therapy (Signed)
[facility] [address] [place], [place], [address] Phone: [phone]   Fax:  [phone]  Physical Therapy Treatment  Patient Details  Name: Mike Holloway MRN: [id number] Date of Birth: 1940-11-21 Referring Provider (PT): Dr. Rudene Christians   Encounter Date: 08/12/2020   PT End of Session - 08/12/20 1413    Visit Number 62    Number of Visits 69    Date for PT Re-Evaluation 08/26/20    Authorization - Visit Number 7    Authorization - Number of Visits 10    PT Start Time 1346    PT Stop Time 1435    PT Time Calculation (min) 49 min    Equipment Utilized During Treatment Gait belt    Activity Tolerance Patient tolerated treatment well;Patient limited by fatigue    Behavior During Therapy [facility] for tasks assessed/performed           Past Medical History:  Diagnosis Date  . Arthritis   . Atrial flutter ([place])   . Diabetes mellitus ([place])   . Essential tremor   . Essential tremor    deep brain stimulator   . Hypercholesteremia   . Hypertension   . Incontinence   . 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[place])    grew on the testical  . SIADH (syndrome of inappropriate ADH production) ([place])   . Sleep apnea   . Stroke [facility])     Past Surgical History:  Procedure Laterality Date  . ABLATION    . APPENDECTOMY    . CATARACT EXTRACTION, BILATERAL    . COLONOSCOPY WITH PROPOFOL N/A 03/17/2018   Procedure: COLONOSCOPY WITH PROPOFOL;  Surgeon: Jonathon Bellows, MD;  Location: [facility] ENDOSCOPY;  Service: Gastroenterology;  Laterality: N/A;  . DEEP BRAIN STIMULATOR PLACEMENT    . 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 HEMORRHOID SURGERY    . HERNIA REPAIR    . HIP FRACTURE SURGERY    . INTRAMEDULLARY (IM) NAIL INTERTROCHANTERIC Right 09/16/2018   Procedure: INTRAMEDULLARY (IM) NAIL INTERTROCHANTRIC;  Surgeon: Hessie Knows, MD;   Location: [facility] ORS;  Service: Orthopedics;  Laterality: Right;  . IR CATHETER TUBE CHANGE  11/22/2018  . NASAL SINUS SURGERY    . ORCHIECTOMY    . TONSILLECTOMY      There were no vitals filed for this visit.   Subjective Assessment - 08/12/20 1411    Subjective Pt reports walking at home consistently. No falls reported. Improved ability to standand get pants up and buckled after seated toileting tasks.    Patient is accompained by: Family member    Pertinent History Pt. states R knee is bothering him but it is the typical discomfort he experiences    Limitations House hold activities;Walking;Standing;Lifting    Patient Stated Goals Improve LE strength/ gait and balance with daily tasks.  Improve gait/ safety/ progression to least assistive device.    Currently in Pain? Yes    Pain Score 2     Pain Location Knee    Pain Orientation Right    Pain Descriptors / Indicators Aching    Pain Type Chronic pain    Pain Onset More than a month ago    Pain Frequency Intermittent    Pain Onset More than a month ago           There.ex:   Nu-Step L5 for 10 min. UE/LE support. Not billed.  Hot pack on lumbar spine for improving LBP after balance and walking tasks.   Neuro Re-ed:   Amb in // bars forwards, backwards, lateral with BUE support for warm up: x4/direction   Amb over x3 small hurdles encouraging single UE support: x6, SBA+1  Amb over x2 high hurdles with BUE support for hip flexor strength and balance   STS "pulling up pants" exercise: 5x, R hand on rollator, L hand on knee pushing to stand. Pt able to maintain balance throughout all trials. SBA+1, cuing for use of momentum ot stand   Alternating cone stacking below belt line forcing pt to challenge balance below COG: x9 cones, for 4 bouts. CGA+1.   PT Education - 08/12/20 1413    Education Details form/technique with exercise.    Person(s) Educated Patient    Methods Explanation;Demonstration;Tactile cues;Verbal cues     Comprehension Verbalized understanding;Returned demonstration               PT Long Term Goals - 07/01/20 1719      PT LONG TERM GOAL #1   Title Pt. independent with HEP to increase B hip flexion/ R quad muscle strength 1/2 muscle grade to improve pain-free mobility.    Baseline R knee extension limited to 4/5 MMT secondary to pain/ fear of pain. 2/9: Deferred.  4/13: 4+/5 MMT (max potential).    Time 4    Period Weeks    Status Achieved      PT LONG TERM GOAL #2   Title Pt. will increase Berg balance test to >40 out of 56 to improve independence with gait/ decrease fall risk.    Baseline Berg: 22/56 (significant fall risk); 9/30 32/56.  11/10: 32 (limited by R knee pain)  2/9: 33/56 limited due to R knee and Low Back pain; 8/10: 29/56; 10/5: 38/56    Time 8    Period Weeks    Status Not Met    Target Date 08/26/20      PT LONG TERM GOAL #3   Title Pt. able to ambulate 100 feet with consistent 2 point gait pattern and use of least restrictive assistive devce to improve household mobility.    Baseline Pt. able to ambulate with use of RW/ CGA to min. A for safety.   Heavy use of B UE.; 9/30 pt able to ambulate with rollator with inconsistent 2 point gait pattern, heavy reliance on UE CGA/supervision  2/9: Pt. uses 2 point step pattern with walking with rollader with decreased stride length. Pt. would be limited due to pain in the knee walking for >100 ft.; 8/10: Pt demonstrates ability to ambulate 100' with consistent 2 point gait pattern with rollator.    Time 0    Period Weeks    Status Achieved      PT LONG TERM GOAL #4   Title Pt. able to stand from normal chair with no UE assist to improve safety/independence with transfers.     Baseline Unable to stand from standard chair without heavy UE assist. 10/6, pt unable to stand from standard chair without UE support.  11/10: benefits from 1 UE assist    2/9: Pt. can rise from normal chair with 1 UE assist and must control descent with  UE assist.; 8/10: Pt requires 2 UE for standing from standard chair height.; 10/5: Requires single UE to stand.    Time 8    Period Weeks    Status Partially Met    Target Date 07/29/20  PT LONG TERM GOAL #5   Title Pt. will perform a sit to stand with 1 UE support to be able to perform toileting activities with more independance.    Baseline Pt. requires 2 UE to push from to sit to stand from standard chair height. Pt. has grab rails available but can not get up by pulling.; 8/10: pt requires 2 UE's to STS from standard chair height.; 10/5: Only required single UE support to stand. Use of momentum but able to perform independently.    Time 8    Period Weeks    Status Achieved    Target Date 07/01/20      PT LONG TERM GOAL #6   Title Pt. will ascend/ descend 4 steps with use of B handrails and step pattern with mod. independence safely to be able to enter son's house.    Baseline Step pattern with heavy UE assist and CGA/min. A for safety.; 8/10: Pt safely asc/desc 4 steps with B hand rails and step to pattern with mod indep to enter son's house safely.    Time 0    Period Weeks    Status Achieved      PT LONG TERM GOAL #7   Title Pt will be able to stand at bathroom sink for 5 min with no UE support to complete ADL's like brushing his teeth, washing his hands, and combing his hair.    Baseline 8/10: Ability to stand with no UE support for 2 min.; 10/5: 3.5 min due to LBP. Has demonstrated in previous PT sessions he can stand for 5 min.    Time 8    Period Weeks    Status Partially Met    Target Date 08/26/20      PT LONG TERM GOAL #8   Title Pt will demonstrate ability to stand from a lowered surface below 18" with single Ue support to demonstrate improved BLE strength to improve transfers from toilet.    Baseline 10/5: Able to stand from 18" chair with single UE support with use of momentum.    Time 8    Period Weeks    Status New    Target Date 08/26/20                  Plan - 08/12/20 1704    Clinical Impression Statement Pt still displaying difficulty with stepping over obstacles without single UE support. Pt's BP remaining stable in the systolic of 937'J and disastolic in the mid 69'C. IMproved ability to perform STS with single UE on rollator imitating pulling pants up from the toilet after seated toileting ADL's with no LOB. Pt practicing cone reaches below COG to improve pt's balance with buckling pants to improve indep with toileting ADL's. Pt can continue to benefit from skilled PT treatment to improve functional mobility.    Personal Factors and Comorbidities Age    Examination-Activity Limitations Toileting;Stand;Squat;Lift;Stairs    Stability/Clinical Decision Making Evolving/Moderate complexity    Clinical Decision Making Moderate    Rehab Potential Fair    PT Frequency 1x / week    PT Duration 8 weeks    PT Treatment/Interventions Moist Heat;Functional mobility training;Therapeutic activities;Therapeutic exercise;Balance training;Patient/family education;Manual techniques;Passive range of motion;Neuromuscular re-education;Stair training;Gait training;ADLs/Self Care Home Management    PT Next Visit Plan Mini squats, lunges, Supine exercises for BLE's.    PT Home Exercise Plan [email or web address]    Consulted and Agree with Plan of Care Patient;Family member/caregiver    Family Member Consulted spouse: [name]  Patient will benefit from skilled therapeutic intervention in order to improve the following deficits and impairments:  Abnormal gait, Decreased endurance, Decreased activity tolerance, Pain, Decreased balance, Impaired flexibility, Decreased strength, Postural dysfunction  Visit Diagnosis: Muscle weakness (generalized)  Gait difficulty  Abnormal posture  Balance problem     Problem List Patient Active Problem List   Diagnosis Date Noted  . DVT femoral (deep venous thrombosis) with thrombophlebitis, left ([place])  02/06/2020  . Lymphedema 04/15/2019  . Bilateral leg edema 01/25/2019  . Hyponatremia 12/24/2018  . SIADH (syndrome of inappropriate ADH production) ([place]) 12/24/2018  . Erosion of urethra due to catheterization of urinary tract ([place]) 10/27/2018  . Generalized weakness 10/14/2018  . Hip fracture ([place]) 09/15/2018  . 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 HLD (hyperlipidemia) 03/24/2018  . Contusion of right knee 02/14/2018  . Bradycardia 12/28/2017  . 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 Tremor 09/04/2016  . Chronic pain of right knee 08/10/2016  . Right ankle pain 08/10/2016  . 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  . 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[place]) 12/11/2015  . Urinary retention 09/08/2015  . Lymphoma, 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[place]) 04/03/2015  . Breathlessness on exertion 11/21/2014  . Breath shortness 11/21/2014  . Arthropathy 11/07/2014  . Atrial flutter, paroxysmal ([place]) 11/07/2014  . Type 2 diabetes mellitus ([place]) 11/07/2014  . Benign essential tremor 11/07/2014  . Benign essential HTN 11/07/2014  . Mixed incontinence 11/07/2014  . Hypercholesterolemia without hypertriglyceridemia 11/07/2014  . Apnea, sleep 11/07/2014  . Controlled type 2 diabetes mellitus without complication ([place]) 52/03/6190  . Pure hypercholesterolemia 11/07/2014  . Other abnormalities of gait and mobility 11/02/2011  . Decreased mobility 11/02/2011  . Abnormal gait 06/29/2011  . Discoordination 06/29/2011   Pura Spice, PT, DPT # [id number] Larna Daughters, SPT 08/13/2020, 7:36 AM  [place] [facility] [address] [place], [place], [address] Phone: [phone]   Fax:  [phone]  Name: Mike Holloway MRN: [id number] Date of Birth: 01/03/1941

## 2020-08-19 ENCOUNTER — Ambulatory Visit: Payer: Medicare Other | Admitting: Physical Therapy

## 2020-08-19 ENCOUNTER — Encounter: Payer: Self-pay | Admitting: Physical Therapy

## 2020-08-19 ENCOUNTER — Other Ambulatory Visit: Payer: Self-pay

## 2020-08-19 DIAGNOSIS — R2689 Other abnormalities of gait and mobility: Secondary | ICD-10-CM

## 2020-08-19 DIAGNOSIS — M6281 Muscle weakness (generalized): Secondary | ICD-10-CM

## 2020-08-19 DIAGNOSIS — R269 Unspecified abnormalities of gait and mobility: Secondary | ICD-10-CM

## 2020-08-19 DIAGNOSIS — R293 Abnormal posture: Secondary | ICD-10-CM

## 2020-08-20 NOTE — Therapy (Signed)
[place] [facility] [address] [place], [place], [address] Phone: [phone]   Fax:  [phone]  Physical Therapy Treatment  Patient Details  Name: Mike Holloway MRN: [id number] Date of Birth: 04-Feb-1941 Referring Provider (PT): Dr. Rudene Christians   Encounter Date: 08/19/2020   PT End of Session - 08/20/20 0721    Visit Number 57    Number of Visits 57    Date for PT Re-Evaluation 08/26/20    Authorization - Visit Number 8    Authorization - Number of Visits 10    PT Start Time 1344    PT Stop Time 1448    PT Time Calculation (min) 64 min    Equipment Utilized During Treatment Gait belt    Activity Tolerance Patient tolerated treatment well;Patient limited by fatigue    Behavior During Therapy [facility] for tasks assessed/performed           Past Medical History:  Diagnosis Date  . Arthritis   . Atrial flutter ([place])   . Diabetes mellitus ([place])   . Essential tremor   . Essential tremor    deep brain stimulator   . Hypercholesteremia   . Hypertension   . Incontinence   . 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[place])    grew on the testical  . SIADH (syndrome of inappropriate ADH production) ([place])   . Sleep apnea   . Stroke [facility])     Past Surgical History:  Procedure Laterality Date  . ABLATION    . APPENDECTOMY    . CATARACT EXTRACTION, BILATERAL    . COLONOSCOPY WITH PROPOFOL N/A 03/17/2018   Procedure: COLONOSCOPY WITH PROPOFOL;  Surgeon: Jonathon Bellows, MD;  Location: [facility] ENDOSCOPY;  Service: Gastroenterology;  Laterality: N/A;  . DEEP BRAIN STIMULATOR PLACEMENT    . 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 HEMORRHOID SURGERY    . HERNIA REPAIR    . HIP FRACTURE SURGERY    . INTRAMEDULLARY (IM) NAIL INTERTROCHANTERIC Right 09/16/2018   Procedure: INTRAMEDULLARY (IM) NAIL INTERTROCHANTRIC;  Surgeon: Hessie Knows, MD;   Location: [facility] ORS;  Service: Orthopedics;  Laterality: Right;  . IR CATHETER TUBE CHANGE  11/22/2018  . NASAL SINUS SURGERY    . ORCHIECTOMY    . TONSILLECTOMY      There were no vitals filed for this visit.   Subjective Assessment - 08/19/20 1349    Subjective Pt. brought to PT by brother due to pts. wife being in hospital for surgery.  Pt. reports no new complaints.    Patient is accompained by: Family member    Pertinent History Pt. states R knee is bothering him but it is the typical discomfort he experiences    Limitations House hold activities;Walking;Standing;Lifting    Patient Stated Goals Improve LE strength/ gait and balance with daily tasks.  Improve gait/ safety/ progression to least assistive device.    Currently in Pain? Yes    Pain Score 2     Pain Location Knee    Pain Orientation Right    Pain Onset More than a month ago    Pain Onset More than a month ago             There.ex:   Seated marching/ LAQ/ heel raises 20x.  Standing hip abduction with heavy UE assist 20x.              Nu-Step  L5 for 10 min. UE/LE support. Not billed. Hot pack on lumbar spine for improving LBP after balance and walking tasks.    Neuro Re-ed:   Amb in // bars forwards, backwards, lateral with BUE support for warm up: x4/direction   Amb. In hallway with use of rollator working on maintaining upright posture and decreasing UE pressure on handles.  Amb over x3 small hurdles encouraging single UE support: x6, SBA+1 Amb over x2 high hurdles with BUE support for hip flexor strength and balance   STS "pulling up pants" exercise: 5x, R hand on rollator, L hand on knee pushing to stand. Pt able to maintain balance throughout all trials. SBA+1, cuing for use of momentum ot stand                   PT Long Term Goals - 07/01/20 1719      PT LONG TERM GOAL #1   Title Pt. independent with HEP to increase B hip flexion/ R quad muscle strength 1/2 muscle grade to improve pain-free  mobility.    Baseline R knee extension limited to 4/5 MMT secondary to pain/ fear of pain. 2/9: Deferred.  4/13: 4+/5 MMT (max potential).    Time 4    Period Weeks    Status Achieved      PT LONG TERM GOAL #2   Title Pt. will increase Berg balance test to >40 out of 56 to improve independence with gait/ decrease fall risk.    Baseline Berg: 22/56 (significant fall risk); 9/30 32/56.  11/10: 32 (limited by R knee pain)  2/9: 33/56 limited due to R knee and Low Back pain; 8/10: 29/56; 10/5: 38/56    Time 8    Period Weeks    Status Not Met    Target Date 08/26/20      PT LONG TERM GOAL #3   Title Pt. able to ambulate 100 feet with consistent 2 point gait pattern and use of least restrictive assistive devce to improve household mobility.    Baseline Pt. able to ambulate with use of RW/ CGA to min. A for safety.   Heavy use of B UE.; 9/30 pt able to ambulate with rollator with inconsistent 2 point gait pattern, heavy reliance on UE CGA/supervision  2/9: Pt. uses 2 point step pattern with walking with rollader with decreased stride length. Pt. would be limited due to pain in the knee walking for >100 ft.; 8/10: Pt demonstrates ability to ambulate 100' with consistent 2 point gait pattern with rollator.    Time 0    Period Weeks    Status Achieved      PT LONG TERM GOAL #4   Title Pt. able to stand from normal chair with no UE assist to improve safety/independence with transfers.     Baseline Unable to stand from standard chair without heavy UE assist. 10/6, pt unable to stand from standard chair without UE support.  11/10: benefits from 1 UE assist    2/9: Pt. can rise from normal chair with 1 UE assist and must control descent with UE assist.; 8/10: Pt requires 2 UE for standing from standard chair height.; 10/5: Requires single UE to stand.    Time 8    Period Weeks    Status Partially Met    Target Date 07/29/20      PT LONG TERM GOAL #5   Title Pt. will perform a sit to stand with 1 UE  support to be able  to perform toileting activities with more independance.    Baseline Pt. requires 2 UE to push from to sit to stand from standard chair height. Pt. has grab rails available but can not get up by pulling.; 8/10: pt requires 2 UE's to STS from standard chair height.; 10/5: Only required single UE support to stand. Use of momentum but able to perform independently.    Time 8    Period Weeks    Status Achieved    Target Date 07/01/20      PT LONG TERM GOAL #6   Title Pt. will ascend/ descend 4 steps with use of B handrails and step pattern with mod. independence safely to be able to enter son's house.    Baseline Step pattern with heavy UE assist and CGA/min. A for safety.; 8/10: Pt safely asc/desc 4 steps with B hand rails and step to pattern with mod indep to enter son's house safely.    Time 0    Period Weeks    Status Achieved      PT LONG TERM GOAL #7   Title Pt will be able to stand at bathroom sink for 5 min with no UE support to complete ADL's like brushing his teeth, washing his hands, and combing his hair.    Baseline 8/10: Ability to stand with no UE support for 2 min.; 10/5: 3.5 min due to LBP. Has demonstrated in previous PT sessions he can stand for 5 min.    Time 8    Period Weeks    Status Partially Met    Target Date 08/26/20      PT LONG TERM GOAL #8   Title Pt will demonstrate ability to stand from a lowered surface below 18" with single Ue support to demonstrate improved BLE strength to improve transfers from toilet.    Baseline 10/5: Able to stand from 18" chair with single UE support with use of momentum.    Time 8    Period Weeks    Status New    Target Date 08/26/20                 Plan - 08/20/20 5329    Clinical Impression Statement Good technique/ posture with sit to stands from gray chair without any verbal cuing or correction.  Increase knee pain with prolonged standing/ balance tasks in //-bars.  Pt. able to complete increase hip  flexion/ step pattern with green hurdles in //-bars.  Pt. ambulates 230 feet in clinic/ hallway with consistent cadence working on decreasing UE assist.  No change in HEP at this time.    Personal Factors and Comorbidities Age    Examination-Activity Limitations Toileting;Stand;Squat;Lift;Stairs    Stability/Clinical Decision Making Evolving/Moderate complexity    Clinical Decision Making Moderate    Rehab Potential Fair    PT Frequency 1x / week    PT Duration 8 weeks    PT Treatment/Interventions Moist Heat;Functional mobility training;Therapeutic activities;Therapeutic exercise;Balance training;Patient/family education;Manual techniques;Passive range of motion;Neuromuscular re-education;Stair training;Gait training;ADLs/Self Care Home Management    PT Next Visit Plan Mini squats, lunges, Supine exercises for BLE's.    PT Home Exercise Plan [email or web address]    Consulted and Agree with Plan of Care Patient;Family member/caregiver    Family Member Consulted spouse: [name]           Patient will benefit from skilled therapeutic intervention in order to improve the following deficits and impairments:  Abnormal gait, Decreased endurance, Decreased activity tolerance, Pain, Decreased balance, Impaired flexibility,  Decreased strength, Postural dysfunction  Visit Diagnosis: Muscle weakness (generalized)  Gait difficulty  Abnormal posture  Balance problem     Problem List Patient Active Problem List   Diagnosis Date Noted  . DVT femoral (deep venous thrombosis) with thrombophlebitis, left ([place]) 02/06/2020  . Lymphedema 04/15/2019  . Bilateral leg edema 01/25/2019  . Hyponatremia 12/24/2018  . SIADH (syndrome of inappropriate ADH production) ([place]) 12/24/2018  . Erosion of urethra due to catheterization of urinary tract ([place]) 10/27/2018  . Generalized weakness 10/14/2018  . Hip fracture ([place]) 09/15/2018  . 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 HLD (hyperlipidemia) 03/24/2018  . Contusion of right knee 02/14/2018  . Bradycardia 12/28/2017  . 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 Tremor 09/04/2016  . Chronic pain of right knee 08/10/2016  . Right ankle pain 08/10/2016  . 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  . 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[place]) 12/11/2015  . Urinary retention 09/08/2015  . Lymphoma, 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[place]) 04/03/2015  . Breathlessness on exertion 11/21/2014  . Breath shortness 11/21/2014  . Arthropathy 11/07/2014  . Atrial flutter, paroxysmal ([place]) 11/07/2014  . Type 2 diabetes mellitus ([place]) 11/07/2014  . Benign essential tremor 11/07/2014  . Benign essential HTN 11/07/2014  . Mixed incontinence 11/07/2014  . Hypercholesterolemia without hypertriglyceridemia 11/07/2014  . Apnea, sleep 11/07/2014  . Controlled type 2 diabetes mellitus without complication ([place]) 97/58/8325  . Pure hypercholesterolemia 11/07/2014  . Other abnormalities of gait and mobility 11/02/2011  . Decreased mobility 11/02/2011  . Abnormal gait 06/29/2011  . Discoordination 06/29/2011   Pura Spice, PT, DPT # [id number] 08/20/2020, 7:25 AM  [place] [facility] [address] [place], [place], [address] Phone: [phone]   Fax:  [phone]  Name: Mike Holloway MRN: [id number] Date of Birth: 20-Sep-1941

## 2020-08-26 ENCOUNTER — Other Ambulatory Visit: Payer: Self-pay

## 2020-08-26 ENCOUNTER — Ambulatory Visit: Payer: Medicare Other | Admitting: Physical Therapy

## 2020-08-26 ENCOUNTER — Encounter: Payer: Self-pay | Admitting: Physical Therapy

## 2020-08-26 DIAGNOSIS — M6281 Muscle weakness (generalized): Secondary | ICD-10-CM

## 2020-08-26 DIAGNOSIS — R2689 Other abnormalities of gait and mobility: Secondary | ICD-10-CM

## 2020-08-26 DIAGNOSIS — R269 Unspecified abnormalities of gait and mobility: Secondary | ICD-10-CM

## 2020-08-26 DIAGNOSIS — R293 Abnormal posture: Secondary | ICD-10-CM

## 2020-08-27 NOTE — Therapy (Signed)
[place] [facility] [address] [place], [place], [address] Phone: [phone]   Fax:  [phone]  Physical Therapy Treatment  Patient Details  Name: Mike Holloway MRN: [id number] Date of Birth: 02-Feb-1941 Referring Provider (PT): Dr. Rudene Christians   Encounter Date: 08/26/2020   PT End of Session - 08/26/20 1626    Visit Number 49    Number of Visits 48    Date for PT Re-Evaluation 08/26/20    Authorization - Visit Number 9    Authorization - Number of Visits 10    PT Start Time 6503    PT Stop Time 1505    PT Time Calculation (min) 77 min    Equipment Utilized During Treatment Gait belt    Activity Tolerance Patient tolerated treatment well;Patient limited by fatigue    Behavior During Therapy [facility] for tasks assessed/performed           Past Medical History:  Diagnosis Date  . Arthritis   . Atrial flutter ([place])   . Diabetes mellitus ([place])   . Essential tremor   . Essential tremor    deep brain stimulator   . Hypercholesteremia   . Hypertension   . Incontinence   . 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[place])    grew on the testical  . SIADH (syndrome of inappropriate ADH production) ([place])   . Sleep apnea   . Stroke [facility])     Past Surgical History:  Procedure Laterality Date  . ABLATION    . APPENDECTOMY    . CATARACT EXTRACTION, BILATERAL    . COLONOSCOPY WITH PROPOFOL N/A 03/17/2018   Procedure: COLONOSCOPY WITH PROPOFOL;  Surgeon: Jonathon Bellows, MD;  Location: [facility] ENDOSCOPY;  Service: Gastroenterology;  Laterality: N/A;  . DEEP BRAIN STIMULATOR PLACEMENT    . 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 END[facility]COPY;  Service: Gastroenterology;  Laterality: N/A;  . HEMORRHOID SURGERY    . HERNIA REPAIR    . HIP FRACTURE SURGERY    . INTRAMEDULLARY (IM) NAIL INTERTROCHANTERIC Right 09/16/2018   Procedure: INTRAMEDULLARY (IM) NAIL INTERTROCHANTRIC;  Surgeon: Hessie Knows, MD;   Location: [facility] ORS;  Service: Orthopedics;  Laterality: Right;  . IR CATHETER TUBE CHANGE  11/22/2018  . NASAL SINUS SURGERY    . ORCHIECTOMY    . TONSILLECTOMY      There were no vitals filed for this visit.   Subjective Assessment - 08/26/20 1354    Subjective Pt. reports no new complaints.  Pt. was able to ascend/ descend stairs at son's house this past weekend without issues.  Pts. wife is still in [place] and scheduled to return home tomorrow.  Pt. states he may be doing to [facility] for a week or 2 while wife recovers with family support at home.  Pt. is not sure and will contact PT if something changes.    Patient is accompained by: Family member    Pertinent History Pt. states R knee is bothering him but it is the typical discomfort he experiences    Limitations House hold activities;Walking;Standing;Lifting    Patient Stated Goals Improve LE strength/ gait and balance with daily tasks.  Improve gait/ safety/ progression to least assistive device.    Currently in Pain? Yes    Pain Score 2     Pain Location Knee    Pain Orientation Right    Pain Onset More than a month ago    Pain Onset More  than a month ago            There.ex:   Seated ex. (4#):  marching/ LAQ/ heel raises 20x.  Standing hip abduction with heavy UE assist 20x.   Nu-Step L5 for 10 min. UE/LE support. Not billed. Hot pack on lumbar spine for improving LBP after balance and walking tasks.   Standing step touches 20x/ transfers at chair 5x.    Reviewed HEP (supine ex).  Standing shoulder flexion/ reaching/ wt. Shifting (no //-bar support)   Neuro Re-ed:   Amb in // bars forwards, backwards, lateral with BUE support for warm up: x4/direction   Amb. In clinic/ hallway with use of rollator working on maintaining upright posture and decreasing UE pressure on handles.  Ascending/ descending stairs with step to pattern.    Standing posture correction with mirror feedback/ scap.  retraction        PT Long Term Goals - 07/01/20 1719      PT LONG TERM GOAL #1   Title Pt. independent with HEP to increase B hip flexion/ R quad muscle strength 1/2 muscle grade to improve pain-free mobility.    Baseline R knee extension limited to 4/5 MMT secondary to pain/ fear of pain. 2/9: Deferred.  4/13: 4+/5 MMT (max potential).    Time 4    Period Weeks    Status Achieved      PT LONG TERM GOAL #2   Title Pt. will increase Berg balance test to >40 out of 56 to improve independence with gait/ decrease fall risk.    Baseline Berg: 22/56 (significant fall risk); 9/30 32/56.  11/10: 32 (limited by R knee pain)  2/9: 33/56 limited due to R knee and Low Back pain; 8/10: 29/56; 10/5: 38/56    Time 8    Period Weeks    Status Not Met    Target Date 08/26/20      PT LONG TERM GOAL #3   Title Pt. able to ambulate 100 feet with consistent 2 point gait pattern and use of least restrictive assistive devce to improve household mobility.    Baseline Pt. able to ambulate with use of RW/ CGA to min. A for safety.   Heavy use of B UE.; 9/30 pt able to ambulate with rollator with inconsistent 2 point gait pattern, heavy reliance on UE CGA/supervision  2/9: Pt. uses 2 point step pattern with walking with rollader with decreased stride length. Pt. would be limited due to pain in the knee walking for >100 ft.; 8/10: Pt demonstrates ability to ambulate 100' with consistent 2 point gait pattern with rollator.    Time 0    Period Weeks    Status Achieved      PT LONG TERM GOAL #4   Title Pt. able to stand from normal chair with no UE assist to improve safety/independence with transfers.     Baseline Unable to stand from standard chair without heavy UE assist. 10/6, pt unable to stand from standard chair without UE support.  11/10: benefits from 1 UE assist    2/9: Pt. can rise from normal chair with 1 UE assist and must control descent with UE assist.; 8/10: Pt requires 2 UE for standing from  standard chair height.; 10/5: Requires single UE to stand.    Time 8    Period Weeks    Status Partially Met    Target Date 07/29/20      PT LONG TERM GOAL #5   Title Pt.  will perform a sit to stand with 1 UE support to be able to perform toileting activities with more independance.    Baseline Pt. requires 2 UE to push from to sit to stand from standard chair height. Pt. has grab rails available but can not get up by pulling.; 8/10: pt requires 2 UE's to STS from standard chair height.; 10/5: Only required single UE support to stand. Use of momentum but able to perform independently.    Time 8    Period Weeks    Status Achieved    Target Date 07/01/20      PT LONG TERM GOAL #6   Title Pt. will ascend/ descend 4 steps with use of B handrails and step pattern with mod. independence safely to be able to enter son's house.    Baseline Step pattern with heavy UE assist and CGA/min. A for safety.; 8/10: Pt safely asc/desc 4 steps with B hand rails and step to pattern with mod indep to enter son's house safely.    Time 0    Period Weeks    Status Achieved      PT LONG TERM GOAL #7   Title Pt will be able to stand at bathroom sink for 5 min with no UE support to complete ADL's like brushing his teeth, washing his hands, and combing his hair.    Baseline 8/10: Ability to stand with no UE support for 2 min.; 10/5: 3.5 min due to LBP. Has demonstrated in previous PT sessions he can stand for 5 min.    Time 8    Period Weeks    Status Partially Met    Target Date 08/26/20      PT LONG TERM GOAL #8   Title Pt will demonstrate ability to stand from a lowered surface below 18" with single Ue support to demonstrate improved BLE strength to improve transfers from toilet.    Baseline 10/5: Able to stand from 18" chair with single UE support with use of momentum.    Time 8    Period Weeks    Status New    Target Date 08/26/20                 Plan - 08/26/20 1627    Clinical Impression  Statement Pt. able to complete standing wt. shift/ UE reaching and AROM ex. with no //-bar support.  Good walking endurance today but continues to require heavy UE assist on //-bars or rollator.  Pt. able to complete stairs with step to pattern safely with no cuing.  No change with HEP.  Pt. instructed to contact PT if any changes with home status happen when pt. wife returns home tomorrow.    Personal Factors and Comorbidities Age    Examination-Activity Limitations Toileting;Stand;Squat;Lift;Stairs    Stability/Clinical Decision Making Evolving/Moderate complexity    Clinical Decision Making Moderate    Rehab Potential Fair    PT Frequency 1x / week    PT Duration 8 weeks    PT Treatment/Interventions Moist Heat;Functional mobility training;Therapeutic activities;Therapeutic exercise;Balance training;Patient/family education;Manual techniques;Passive range of motion;Neuromuscular re-education;Stair training;Gait training;ADLs/Self Care Home Management    PT Next Visit Plan RECERT next tx.  Discuss status at home while wife if recovering.    PT Home Exercise Plan [id number]    Consulted and Agree with Plan of Care Patient;Family member/caregiver    Family Member Consulted spouse: [name]           Patient will benefit from skilled therapeutic intervention  in order to improve the following deficits and impairments:  Abnormal gait, Decreased endurance, Decreased activity tolerance, Pain, Decreased balance, Impaired flexibility, Decreased strength, Postural dysfunction  Visit Diagnosis: Muscle weakness (generalized)  Gait difficulty  Abnormal posture  Balance problem     Problem List Patient Active Problem List   Diagnosis Date Noted  . DVT femoral (deep venous thrombosis) with thrombophlebitis, left ([place]) 02/06/2020  . Lymphedema 04/15/2019  . Bilateral leg edema 01/25/2019  . Hyponatremia 12/24/2018  . SIADH (syndrome of inappropriate ADH production) ([place]) 12/24/2018  . Erosion of  urethra due to catheterization of urinary tract ([place]) 10/27/2018  . Generalized weakness 10/14/2018  . Hip fracture ([place]) 09/15/2018  . 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 HLD (hyperlipidemia) 03/24/2018  . Contusion of right knee 02/14/2018  . Bradycardia 12/28/2017  . 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 Tremor 09/04/2016  . Chronic pain of right knee 08/10/2016  . Right ankle pain 08/10/2016  . 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  . 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[place]) 12/11/2015  . Urinary retention 09/08/2015  . Lymphoma, 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[place]) 04/03/2015  . Breathlessness on exertion 11/21/2014  . Breath shortness 11/21/2014  . Arthropathy 11/07/2014  . Atrial flutter, paroxysmal ([place]) 11/07/2014  . Type 2 diabetes mellitus ([place]) 11/07/2014  . Benign essential tremor 11/07/2014  . Benign essential HTN 11/07/2014  . Mixed incontinence 11/07/2014  . Hypercholesterolemia without hypertriglyceridemia 11/07/2014  . Apnea, sleep 11/07/2014  . Controlled type 2 diabetes mellitus without complication ([place]) 78/24/2353  . Pure hypercholesterolemia 11/07/2014  . Other abnormalities of gait and mobility 11/02/2011  . Decreased mobility 11/02/2011  . Abnormal gait 06/29/2011  . Discoordination 06/29/2011   Pura Spice, PT, DPT # [phone] 08/27/2020, 8:59 AM  [place] [address] [place], [place], [address] Phone: [phone]   Fax:  [phone]  Name: Mike Holloway MRN: [id number] Date of Birth: 07-26-1941

## 2020-09-02 ENCOUNTER — Ambulatory Visit: Payer: Medicare Other | Attending: Orthopedic Surgery | Admitting: Physical Therapy

## 2020-09-02 ENCOUNTER — Other Ambulatory Visit: Payer: Self-pay

## 2020-09-02 DIAGNOSIS — R2689 Other abnormalities of gait and mobility: Secondary | ICD-10-CM | POA: Diagnosis present

## 2020-09-02 DIAGNOSIS — R269 Unspecified abnormalities of gait and mobility: Secondary | ICD-10-CM | POA: Diagnosis present

## 2020-09-02 DIAGNOSIS — M6281 Muscle weakness (generalized): Secondary | ICD-10-CM | POA: Insufficient documentation

## 2020-09-02 DIAGNOSIS — R293 Abnormal posture: Secondary | ICD-10-CM

## 2020-09-03 ENCOUNTER — Encounter: Payer: Self-pay | Admitting: Physical Therapy

## 2020-09-03 NOTE — Therapy (Signed)
[place] [facility] [address] [place], [place], [address] Phone: [phone]   Fax:  [phone]  Physical Therapy Treatment  Patient Details  Name: Mike Holloway MRN: [id number] Date of Birth: Nov 17, 1940 Referring Provider (PT): Dr. Rudene Christians   Encounter Date: 09/02/2020   PT End of Session - 09/03/20 1405    Visit Number 4    Number of Visits 100    Date for PT Re-Evaluation 10/28/20    Authorization - Visit Number 1    Authorization - Number of Visits 10    PT Start Time 9532    PT Stop Time 1443    PT Time Calculation (min) 64 min    Equipment Utilized During Treatment Gait belt    Activity Tolerance Patient tolerated treatment well;Patient limited by fatigue    Behavior During Therapy [facility] for tasks assessed/performed           Past Medical History:  Diagnosis Date  . Arthritis   . Atrial flutter ([place])   . Diabetes mellitus ([place])   . Essential tremor   . Essential tremor    deep brain stimulator   . Hypercholesteremia   . Hypertension   . Incontinence   . 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[place])    grew on the testical  . SIADH (syndrome of inappropriate ADH production) ([place])   . Sleep apnea   . Stroke [facility])     Past Surgical History:  Procedure Laterality Date  . ABLATION    . APPENDECTOMY    . CATARACT EXTRACTION, BILATERAL    . COLONOSCOPY WITH PROPOFOL N/A 03/17/2018   Procedure: COLONOSCOPY WITH PROPOFOL;  Surgeon: Jonathon Bellows, MD;  Location: [facility] ENDOSCOPY;  Service: Gastroenterology;  Laterality: N/A;  . DEEP BRAIN STIMULATOR PLACEMENT    . 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 HEMORRHOID SURGERY    . HERNIA REPAIR    . HIP FRACTURE SURGERY    . INTRAMEDULLARY (IM) NAIL INTERTROCHANTERIC Right 09/16/2018   Procedure: INTRAMEDULLARY (IM) NAIL INTERTROCHANTRIC;  Surgeon: Hessie Knows, MD;   Location: [facility] ORS;  Service: Orthopedics;  Laterality: Right;  . IR CATHETER TUBE CHANGE  11/22/2018  . NASAL SINUS SURGERY    . ORCHIECTOMY    . TONSILLECTOMY      There were no vitals filed for this visit.   Subjective Assessment - 09/03/20 1402    Subjective Pt. is currently staying at [facility] facility for next 2 weeks while wife recovers from abdominal surgery (no BLT restrictions).    Patient is accompained by: Family member    Pertinent History Pt. states R knee is bothering him but it is the typical discomfort he experiences    Limitations House hold activities;Walking;Standing;Lifting    Patient Stated Goals Improve LE strength/ gait and balance with daily tasks.  Improve gait/ safety/ progression to least assistive device.    Currently in Pain? Yes    Pain Score 2     Pain Location Knee    Pain Orientation Right    Pain Descriptors / Indicators Aching    Pain Onset More than a month ago    Pain Onset More than a month ago              [facility] PT Assessment - 09/03/20 0001      Assessment   Medical Diagnosis S/p R subtrochanteric hip fracture, S/p R  ORIF fracture of hip.      Referring Provider (PT) Dr. Rudene Christians    Onset Date/Surgical Date 09/15/18    Prior Therapy Yes, known well to PT      Prior Function   Level of Independence Independent with household mobility with device      Cognition   Overall Cognitive Status Within Functional Limits for tasks assessed            There.ex:  6" step ups/ downs with eccentric quad control in //-bars.  Cuing for decrease UE assist. 10x2  Seated ex. (4#):  marching/ LAQ/ heel raises 20x. Standing hip abduction with heavy UE assist 20x.   Nu-Step L5 for 10 min. UE/LE support. Not billed. Hot pack on lumbar spine for improving LBP after balance and walking tasks.   Standing high marching (less UE assist on L UE).    Adjusted new rollator for proper size/ back support   Neuro Re-ed:   Amb in //  bars forwards, backwards, lateral with BUE support for warm up: x4/direction  Amb. In clinic/ hallway with use of rollator working on maintaining upright posture and decreasing UE pressure on handles.  Practice with sit to stands from blue mat table while managing pant leg/ balance/ safety.   Standing posture correction with mirror feedback/ scap. retraction       PT Long Term Goals - 09/03/20 1422      PT LONG TERM GOAL #1   Title Pt. independent with HEP to increase B hip flexion/ R quad muscle strength 1/2 muscle grade to improve pain-free mobility.    Baseline R knee extension limited to 4/5 MMT secondary to pain/ fear of pain. 2/9: Deferred.  4/13: 4+/5 MMT (max potential).    Time 4    Period Weeks    Status Achieved      PT LONG TERM GOAL #2   Title Pt. will increase Berg balance test to >40 out of 56 to improve independence with gait/ decrease fall risk.    Baseline Berg: 22/56 (significant fall risk); 9/30 32/56.  11/10: 32 (limited by R knee pain)  2/9: 33/56 limited due to R knee and Low Back pain; 8/10: 29/56; 10/5: 38/56    Time 8    Period Weeks    Status Partially Met    Target Date 10/28/20      PT LONG TERM GOAL #3   Title Pt. able to ambulate 100 feet with consistent 2 point gait pattern and use of least restrictive assistive devce to improve household mobility.    Baseline Pt. able to ambulate with use of RW/ CGA to min. A for safety.   Heavy use of B UE.; 9/30 pt able to ambulate with rollator with inconsistent 2 point gait pattern, heavy reliance on UE CGA/supervision  2/9: Pt. uses 2 point step pattern with walking with rollader with decreased stride length. Pt. would be limited due to pain in the knee walking for >100 ft.; 8/10: Pt demonstrates ability to ambulate 100' with consistent 2 point gait pattern with rollator.    Time 0    Period Weeks    Status Achieved      PT LONG TERM GOAL #4   Title Pt. able to stand from normal chair with no UE assist  to improve safety/independence with transfers.     Baseline Unable to stand from standard chair without heavy UE assist. 10/6, pt unable to stand from standard chair without UE support.  11/10: benefits  from 1 UE assist    2/9: Pt. can rise from normal chair with 1 UE assist and must control descent with UE assist.; 8/10: Pt requires 2 UE for standing from standard chair height.; 10/5: Requires single UE to stand.    Time 8    Period Weeks    Status Partially Met    Target Date 10/28/20      PT LONG TERM GOAL #5   Title Pt. will perform a sit to stand with 1 UE support to be able to perform toileting activities with more independance.    Baseline Pt. requires 2 UE to push from to sit to stand from standard chair height. Pt. has grab rails available but can not get up by pulling.; 8/10: pt requires 2 UE's to STS from standard chair height.; 10/5: Only required single UE support to stand. Use of momentum but able to perform independently.    Time 8    Period Weeks    Status Achieved      PT LONG TERM GOAL #6   Title Pt. will ascend/ descend 4 steps with use of B handrails and step pattern with mod. independence safely to be able to enter son's house.    Baseline Step pattern with heavy UE assist and CGA/min. A for safety.; 8/10: Pt safely asc/desc 4 steps with B hand rails and step to pattern with mod indep to enter son's house safely.    Time 0    Period Weeks    Status Achieved      PT LONG TERM GOAL #7   Title Pt will be able to stand at bathroom sink for 5 min with no UE support to complete ADL's like brushing his teeth, washing his hands, and combing his hair.    Baseline 8/10: Ability to stand with no UE support for 2 min.; 10/5: 3.5 min due to LBP. Has demonstrated in previous PT sessions he can stand for 5 min.    Time 8    Period Weeks    Status Partially Met    Target Date 10/28/20      PT LONG TERM GOAL #8   Title Pt will demonstrate ability to stand from a lowered surface  below 18" with single Ue support to demonstrate improved BLE strength to improve transfers from toilet.    Baseline 10/5: Able to stand from 18" chair with single UE support with use of momentum.    Time 8    Period Weeks    Status Partially Met    Target Date 10/28/20                 Plan - 09/03/20 1410    Clinical Impression Statement Pt. walked into PT today with no rollator (properly sized by PT and adjusted back support).  Pt. reports minimal knee pain with standing/ step ups with heavy UE assist required.  Pt. has done better with managing donning/ doffiing pants to use of the bathroom at home with less assistance.  Pt. completes more consistent step overs/ step pattern while manevering cones/ obstacles in PT gym with consistent cadence.  Several short seated rest breaks required t/o tx. to complete standing ther.ex/ balance tasks.  No recent LOB at home or during PT and pt. ambulates with less assistance/ confidence over past month.   Pt. demonstrated ability to walk in // bars with one hand support with CGA behind and therapist in front CGA at knees. Pt. will continue to benefit from  skilled PT to improve functional independence at home.    Personal Factors and Comorbidities Age    Examination-Activity Limitations Toileting;Stand;Squat;Lift;Stairs    Stability/Clinical Decision Making Evolving/Moderate complexity    Clinical Decision Making Moderate    Rehab Potential Fair    PT Frequency 1x / week    PT Duration 8 weeks    PT Treatment/Interventions Moist Heat;Functional mobility training;Therapeutic activities;Therapeutic exercise;Balance training;Patient/family education;Manual techniques;Passive range of motion;Neuromuscular re-education;Stair training;Gait training;ADLs/Self Care Home Management    PT Next Visit Plan Progress hip flexion/ step pattern with less UE assist.    PT Home Exercise Plan [facility]    Consulted and Agree with Plan of Care Patient;Family  member/caregiver    Family Member Consulted spouse: [name]           Patient will benefit from skilled therapeutic intervention in order to improve the following deficits and impairments:  Abnormal gait, Decreased endurance, Decreased activity tolerance, Pain, Decreased balance, Impaired flexibility, Decreased strength, Postural dysfunction  Visit Diagnosis: Muscle weakness (generalized)  Gait difficulty  Abnormal posture  Balance problem     Problem List Patient Active Problem List   Diagnosis Date Noted  . DVT femoral (deep venous thrombosis) with thrombophlebitis, left ([place]) 02/06/2020  . Lymphedema 04/15/2019  . Bilateral leg edema 01/25/2019  . Hyponatremia 12/24/2018  . SIADH (syndrome of inappropriate ADH production) ([place]) 12/24/2018  . Erosion of urethra due to catheterization of urinary tract ([place]) 10/27/2018  . Generalized weakness 10/14/2018  . Hip fracture ([place]) 09/15/2018  . 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 HLD (hyperlipidemia) 03/24/2018  . Contusion of right knee 02/14/2018  . Bradycardia 12/28/2017  . 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 Tremor 09/04/2016  . Chronic pain of right knee 08/10/2016  . Right ankle pain 08/10/2016  . 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  . 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[place]) 12/11/2015  . Urinary retention 09/08/2015  . Lymphoma, 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[place]) 04/03/2015  . Breathlessness on exertion 11/21/2014  . Breath shortness 11/21/2014  . Arthropathy 11/07/2014  . Atrial flutter, paroxysmal ([place]) 11/07/2014  . Type 2 diabetes mellitus ([place]) 11/07/2014  . Benign essential tremor 11/07/2014  . Benign essential HTN 11/07/2014  . Mixed incontinence 11/07/2014  . Hypercholesterolemia without hypertriglyceridemia 11/07/2014  . Apnea, sleep 11/07/2014  . Controlled type 2 diabetes mellitus without  complication ([place]) 44/31/5400  . Pure hypercholesterolemia 11/07/2014  . Other abnormalities of gait and mobility 11/02/2011  . Decreased mobility 11/02/2011  . Abnormal gait 06/29/2011  . Discoordination 06/29/2011   Pura Spice, PT, DPT # [id number] 09/03/2020, 2:25 PM  [facility] [address] [place], [place], [address] Phone: [phone]   Fax:  [phone]  Name: Mike Holloway MRN: [id number] Date of Birth: [birth year]

## 2020-09-08 ENCOUNTER — Ambulatory Visit (INDEPENDENT_AMBULATORY_CARE_PROVIDER_SITE_OTHER): Payer: Medicare Other | Admitting: Physician Assistant

## 2020-09-08 ENCOUNTER — Encounter: Payer: Self-pay | Admitting: Physician Assistant

## 2020-09-08 ENCOUNTER — Other Ambulatory Visit: Payer: Self-pay

## 2020-09-08 VITALS — BP 156/76 | HR 78 | Ht 70.0 in

## 2020-09-08 DIAGNOSIS — R339 Retention of urine, unspecified: Secondary | ICD-10-CM

## 2020-09-08 NOTE — Progress Notes (Signed)
Cath Change/ Replacement  Patient is present today for a catheter change due to urinary retention.  59ml of water was removed from the balloon, a 16FR coud foley cath was removed without difficulty.  Patient was cleaned and prepped in a sterile fashion with betadine and 2% lidocaine jelly was instilled into the urethra. A 16 FR coud foley cath was replaced into the bladder no complications were noted Urine return was noted 55ml and urine was yellow in color. The balloon was filled with 72ml of sterile water. A leg bag was attached for drainage.  A night bag was also given to the patient and patient was given instruction on how to change from one bag to another. Patient was given proper instruction on catheter care.    Performed by: Debroah Loop, PA-C   Follow up: Return in about 4 weeks (around 10/06/2020) for Catheter exchange.

## 2020-09-09 ENCOUNTER — Encounter: Payer: Self-pay | Admitting: Physical Therapy

## 2020-09-09 ENCOUNTER — Ambulatory Visit: Payer: Medicare Other | Admitting: Physical Therapy

## 2020-09-09 DIAGNOSIS — R2689 Other abnormalities of gait and mobility: Secondary | ICD-10-CM

## 2020-09-09 DIAGNOSIS — M6281 Muscle weakness (generalized): Secondary | ICD-10-CM

## 2020-09-09 DIAGNOSIS — R269 Unspecified abnormalities of gait and mobility: Secondary | ICD-10-CM

## 2020-09-09 DIAGNOSIS — R293 Abnormal posture: Secondary | ICD-10-CM

## 2020-09-10 NOTE — Therapy (Signed)
[facility] [address] [place], [place], [address] Phone: [phone]   Fax:  [phone]  Physical Therapy Treatment  Patient Details  Name: Mike Holloway MRN: [id number] Date of Birth: [birth year] Referring Provider (PT): Dr. Rudene Christians   Encounter Date: 09/09/2020   PT End of Session - 09/10/20 1523    Visit Number 50    Number of Visits 3    Date for PT Re-Evaluation 10/28/20    Authorization - Visit Number 2    Authorization - Number of Visits 10    PT Start Time 1346    PT Stop Time 1456    PT Time Calculation (min) 70 min    Equipment Utilized During Treatment Gait belt    Activity Tolerance Patient tolerated treatment well    Behavior During Therapy [facility] for tasks assessed/performed           Past Medical History:  Diagnosis Date  . Arthritis   . Atrial flutter ([place])   . Diabetes mellitus ([place])   . Essential tremor   . Essential tremor    deep brain stimulator   . Hypercholesteremia   . Hypertension   . Incontinence   . 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[place])    grew on the testical  . SIADH (syndrome of inappropriate ADH production) ([place])   . Sleep apnea   . Stroke [facility])     Past Surgical History:  Procedure Laterality Date  . ABLATION    . APPENDECTOMY    . CATARACT EXTRACTION, BILATERAL    . COLONOSCOPY WITH PROPOFOL N/A 03/17/2018   Procedure: COLONOSCOPY WITH PROPOFOL;  Surgeon: Jonathon Bellows, MD;  Location: [facility] ENDOSCOPY;  Service: Gastroenterology;  Laterality: N/A;  . DEEP BRAIN STIMULATOR PLACEMENT    . 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 HEMORRHOID SURGERY    . HERNIA REPAIR    . HIP FRACTURE SURGERY    . INTRAMEDULLARY (IM) NAIL INTERTROCHANTERIC Right 09/16/2018   Procedure: INTRAMEDULLARY (IM) NAIL INTERTROCHANTRIC;  Surgeon: Hessie Knows, MD;  Location: [facility] ORS;   Service: Orthopedics;  Laterality: Right;  . IR CATHETER TUBE CHANGE  11/22/2018  . NASAL SINUS SURGERY    . ORCHIECTOMY    . TONSILLECTOMY      There were no vitals filed for this visit.   Subjective Assessment - 09/10/20 1521    Subjective Pt. continues to stay at [facility] facility while wife is recovering from surgery.  Pt. is ready to return home but will not be able to return until end of next week.  Pt. reports minimal knee discomfort prior to PT tx. session.    Patient is accompained by: Family member    Pertinent History Pt. states R knee is bothering him but it is the typical discomfort he experiences    Limitations House hold activities;Walking;Standing;Lifting    Patient Stated Goals Improve LE strength/ gait and balance with daily tasks.  Improve gait/ safety/ progression to least assistive device.    Currently in Pain? Yes    Pain Score 1     Pain Location Knee    Pain Orientation Right    Pain Descriptors / Indicators Aching    Pain Onset More than a month ago    Pain Onset More than a month ago            There.ex:  Seatedex. (4#):marching/ LAQ/  heel raises 20x. Standing hip abduction with heavy UE assist 20x.   6" step ups/ downs with eccentric quad control in //-bars.  Cuing for decrease UE assist. 10x2.  Step touches/ lateral step ups at 4 stairs with B UE assist on R handrail.  Nu-Step L5 for 10 min. UE/LE support. Not billed. Hot pack on lumbar spine for improving LBP after balance and walking tasks.    Neuro Re-ed:   STS from blue mat table with UE assist and no rollator assist while standing/ wt. Shifting and cuing to control sitting with quad control 6x.  Amb in // bars forwards, backwards, lateral with BUE support for warm up: x4/direction  Amb. Inclinic/hallway with use of rollator working on maintaining upright posture and decreasing UE pressure on handles.  Walking over green hurdles in //-bars with focus on hip  flexion/ step pattern.  Verbal cuing 2x to prevent hip circumduction.  .  Standing posture correction with mirror feedback/ scap. retraction     PT Long Term Goals - 09/03/20 1422      PT LONG TERM GOAL #1   Title Pt. independent with HEP to increase B hip flexion/ R quad muscle strength 1/2 muscle grade to improve pain-free mobility.    Baseline R knee extension limited to 4/5 MMT secondary to pain/ fear of pain. 2/9: Deferred.  4/13: 4+/5 MMT (max potential).    Time 4    Period Weeks    Status Achieved      PT LONG TERM GOAL #2   Title Pt. will increase Berg balance test to >40 out of 56 to improve independence with gait/ decrease fall risk.    Baseline Berg: 22/56 (significant fall risk); 9/30 32/56.  11/10: 32 (limited by R knee pain)  2/9: 33/56 limited due to R knee and Low Back pain; 8/10: 29/56; 10/5: 38/56    Time 8    Period Weeks    Status Partially Met    Target Date 10/28/20      PT LONG TERM GOAL #3   Title Pt. able to ambulate 100 feet with consistent 2 point gait pattern and use of least restrictive assistive devce to improve household mobility.    Baseline Pt. able to ambulate with use of RW/ CGA to min. A for safety.   Heavy use of B UE.; 9/30 pt able to ambulate with rollator with inconsistent 2 point gait pattern, heavy reliance on UE CGA/supervision  2/9: Pt. uses 2 point step pattern with walking with rollader with decreased stride length. Pt. would be limited due to pain in the knee walking for >100 ft.; 8/10: Pt demonstrates ability to ambulate 100' with consistent 2 point gait pattern with rollator.    Time 0    Period Weeks    Status Achieved      PT LONG TERM GOAL #4   Title Pt. able to stand from normal chair with no UE assist to improve safety/independence with transfers.     Baseline Unable to stand from standard chair without heavy UE assist. 10/6, pt unable to stand from standard chair without UE support.  11/10: benefits from 1 UE assist    2/9: Pt.  can rise from normal chair with 1 UE assist and must control descent with UE assist.; 8/10: Pt requires 2 UE for standing from standard chair height.; 10/5: Requires single UE to stand.    Time 8    Period Weeks    Status Partially Met  Target Date 10/28/20      PT LONG TERM GOAL #5   Title Pt. will perform a sit to stand with 1 UE support to be able to perform toileting activities with more independance.    Baseline Pt. requires 2 UE to push from to sit to stand from standard chair height. Pt. has grab rails available but can not get up by pulling.; 8/10: pt requires 2 UE's to STS from standard chair height.; 10/5: Only required single UE support to stand. Use of momentum but able to perform independently.    Time 8    Period Weeks    Status Achieved      PT LONG TERM GOAL #6   Title Pt. will ascend/ descend 4 steps with use of B handrails and step pattern with mod. independence safely to be able to enter son's house.    Baseline Step pattern with heavy UE assist and CGA/min. A for safety.; 8/10: Pt safely asc/desc 4 steps with B hand rails and step to pattern with mod indep to enter son's house safely.    Time 0    Period Weeks    Status Achieved      PT LONG TERM GOAL #7   Title Pt will be able to stand at bathroom sink for 5 min with no UE support to complete ADL's like brushing his teeth, washing his hands, and combing his hair.    Baseline 8/10: Ability to stand with no UE support for 2 min.; 10/5: 3.5 min due to LBP. Has demonstrated in previous PT sessions he can stand for 5 min.    Time 8    Period Weeks    Status Partially Met    Target Date 10/28/20      PT LONG TERM GOAL #8   Title Pt will demonstrate ability to stand from a lowered surface below 18" with single Ue support to demonstrate improved BLE strength to improve transfers from toilet.    Baseline 10/5: Able to stand from 18" chair with single UE support with use of momentum.    Time 8    Period Weeks    Status  Partially Met    Target Date 10/28/20                 Plan - 09/10/20 1524    Clinical Impression Statement Pt. demonstrates marked improvement in proper sit to stand technique from edge of blue mat table without use of rollator in front of patient.  Pt. able to wt. shift with upright posture and prevent posterior lean prior to returning to seated posture.  No LOB during tx. with consistent step pattern but pt. remains heavy with UE assist on RW.  Slight increase in low back while walking increase distances in hallway and during step ups/ touches.  No change to HEP and pt. will walking more at [facility].    Personal Factors and Comorbidities Age    Examination-Activity Limitations Toileting;Stand;Squat;Lift;Stairs    Stability/Clinical Decision Making Evolving/Moderate complexity    Clinical Decision Making Moderate    Rehab Potential Fair    PT Frequency 1x / week    PT Duration 8 weeks    PT Treatment/Interventions Moist Heat;Functional mobility training;Therapeutic activities;Therapeutic exercise;Balance training;Patient/family education;Manual techniques;Passive range of motion;Neuromuscular re-education;Stair training;Gait training;ADLs/Self Care Home Management    PT Next Visit Plan Progress hip flexion/ step pattern with less UE assist.    PT Home Exercise Plan [email or web address]    Consulted and Agree with  Plan of Care Patient;Family member/caregiver    Family Member Consulted spouse: [name]           Patient will benefit from skilled therapeutic intervention in order to improve the following deficits and impairments:  Abnormal gait,Decreased endurance,Decreased activity tolerance,Pain,Decreased balance,Impaired flexibility,Decreased strength,Postural dysfunction  Visit Diagnosis: Muscle weakness (generalized)  Gait difficulty  Abnormal posture  Balance problem     Problem List Patient Active Problem List   Diagnosis Date Noted  . DVT femoral (deep venous thrombosis)  with thrombophlebitis, left ([place]) 02/06/2020  . Lymphedema 04/15/2019  . Bilateral leg edema 01/25/2019  . Hyponatremia 12/24/2018  . SIADH (syndrome of inappropriate ADH production) ([place]) 12/24/2018  . Erosion of urethra due to catheterization of urinary tract ([place]) 10/27/2018  . Generalized weakness 10/14/2018  . Hip fracture ([place]) 09/15/2018  . 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 HLD (hyperlipidemia) 03/24/2018  . Contusion of right knee 02/14/2018  . Bradycardia 12/28/2017  . 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 Tremor 09/04/2016  . Chronic pain of right knee 08/10/2016  . Right ankle pain 08/10/2016  . 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  . 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[place]) 12/11/2015  . Urinary retention 09/08/2015  . Lymphoma, 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[place]) 04/03/2015  . Breathlessness on exertion 11/21/2014  . Breath shortness 11/21/2014  . Arthropathy 11/07/2014  . Atrial flutter, paroxysmal ([place]) 11/07/2014  . Type 2 diabetes mellitus ([place]) 11/07/2014  . Benign essential tremor 11/07/2014  . Benign essential HTN 11/07/2014  . Mixed incontinence 11/07/2014  . Hypercholesterolemia without hypertriglyceridemia 11/07/2014  . Apnea, sleep 11/07/2014  . Controlled type 2 diabetes mellitus without complication ([place]) 24/19/9144  . Pure hypercholesterolemia 11/07/2014  . Other abnormalities of gait and mobility 11/02/2011  . Decreased mobility 11/02/2011  . Abnormal gait 06/29/2011  . Discoordination 06/29/2011   Pura Spice, PT, DPT # [id number] 09/10/2020, 3:38 PM  [facility] [address] [place], [place], [address] Phone: [phone]   Fax:  [phone]  Name: Alize Acy MRN: [id number] Date of Birth: [birth year]

## 2020-09-16 ENCOUNTER — Ambulatory Visit: Payer: Medicare Other | Admitting: Physical Therapy

## 2020-09-16 ENCOUNTER — Encounter: Payer: Self-pay | Admitting: Physical Therapy

## 2020-09-16 ENCOUNTER — Other Ambulatory Visit: Payer: Self-pay

## 2020-09-16 DIAGNOSIS — R293 Abnormal posture: Secondary | ICD-10-CM

## 2020-09-16 DIAGNOSIS — R269 Unspecified abnormalities of gait and mobility: Secondary | ICD-10-CM

## 2020-09-16 DIAGNOSIS — M6281 Muscle weakness (generalized): Secondary | ICD-10-CM | POA: Diagnosis not present

## 2020-09-16 DIAGNOSIS — R2689 Other abnormalities of gait and mobility: Secondary | ICD-10-CM

## 2020-09-16 NOTE — Therapy (Signed)
[facility] [address] [place], [place], [address] Phone: [phone]   Fax:  [phone]  Physical Therapy Treatment  Patient Details  Name: Mike Holloway MRN: [id number] Date of Birth: 1941-01-05 Referring Provider (PT): Dr. Rudene Christians   Encounter Date: 09/16/2020   PT End of Session - 09/16/20 1338    Visit Number 91    Number of Visits 63    Date for PT Re-Evaluation 10/28/20    Authorization - Visit Number 3    Authorization - Number of Visits 10    PT Start Time 1224    PT Stop Time 1438    PT Time Calculation (min) 53 min    Equipment Utilized During Treatment Gait belt    Activity Tolerance Patient tolerated treatment well    Behavior During Therapy [facility] for tasks assessed/performed           Past Medical History:  Diagnosis Date  . Arthritis   . Atrial flutter ([place])   . Diabetes mellitus ([place])   . Essential tremor   . Essential tremor    deep brain stimulator   . Hypercholesteremia   . Hypertension   . Incontinence   . 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[place])    grew on the testical  . SIADH (syndrome of inappropriate ADH production) ([place])   . Sleep apnea   . Stroke [facility])     Past Surgical History:  Procedure Laterality Date  . ABLATION    . APPENDECTOMY    . CATARACT EXTRACTION, BILATERAL    . COLONOSCOPY WITH PROPOFOL N/A 03/17/2018   Procedure: COLONOSCOPY WITH PROPOFOL;  Surgeon: Jonathon Bellows, MD;  Location: [facility] ENDOSCOPY;  Service: Gastroenterology;  Laterality: N/A;  . DEEP BRAIN STIMULATOR PLACEMENT    . 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 HEMORRHOID SURGERY    . HERNIA REPAIR    . HIP FRACTURE SURGERY    . INTRAMEDULLARY (IM) NAIL INTERTROCHANTERIC Right 09/16/2018   Procedure: INTRAMEDULLARY (IM) NAIL INTERTROCHANTRIC;  Surgeon: Hessie Knows, MD;  Location: [facility] ORS;   Service: Orthopedics;  Laterality: Right;  . IR CATHETER TUBE CHANGE  11/22/2018  . NASAL SINUS SURGERY    . ORCHIECTOMY    . TONSILLECTOMY      There were no vitals filed for this visit.   Subjective Assessment - 09/16/20 1337    Subjective Pt. is still staying at [facility].  Pt. is not happy with living situation and looking forward to returning home.  Pt. is planning to particiapte with Agility Program 1-2x/week at [facility] to benefit strengthening/ cardio.    Patient is accompained by: Family member    Pertinent History Pt. states R knee is bothering him but it is the typical discomfort he experiences    Limitations House hold activities;Walking;Standing;Lifting    Patient Stated Goals Improve LE strength/ gait and balance with daily tasks.  Improve gait/ safety/ progression to least assistive device.    Currently in Pain? Yes    Pain Score 1     Pain Location Knee    Pain Orientation Right    Pain Descriptors / Indicators Aching    Pain Onset More than a month ago    Pain Onset More than a month ago            There.ex:  Seatedex. (4#):seated marching/ LAQ/ heel raises 20x. Standing hip  flexion/ abduction/ extension with heavy UE assist 20x.  Slight increase in back pain.    Standing wt. Shifting/ transfers from gray chair.  Nu-Step L5 for 10 min. UE/LE support. Not billed. Hot pack on lumbar spine for improving LBP after balance and walking tasks.    Neuro Re-ed:   Amb in // bars forwards, backwards, lateral with BUE support for warm up: x4/direction  Amb. Inclinic/hallway with use of rollator working on maintaining upright posture and decreasing UE pressure on handles.  Walking over green hurdles in //-bars with focus on hip flexion/ step pattern.  Verbal cuing 2x to prevent hip circumduction.    Standing posture correction with mirror feedback/ scap. retraction     PT Long Term Goals - 09/03/20 1422      PT LONG TERM GOAL  #1   Title Pt. independent with HEP to increase B hip flexion/ R quad muscle strength 1/2 muscle grade to improve pain-free mobility.    Baseline R knee extension limited to 4/5 MMT secondary to pain/ fear of pain. 2/9: Deferred.  4/13: 4+/5 MMT (max potential).    Time 4    Period Weeks    Status Achieved      PT LONG TERM GOAL #2   Title Pt. will increase Berg balance test to >40 out of 56 to improve independence with gait/ decrease fall risk.    Baseline Berg: 22/56 (significant fall risk); 9/30 32/56.  11/10: 32 (limited by R knee pain)  2/9: 33/56 limited due to R knee and Low Back pain; 8/10: 29/56; 10/5: 38/56    Time 8    Period Weeks    Status Partially Met    Target Date 10/28/20      PT LONG TERM GOAL #3   Title Pt. able to ambulate 100 feet with consistent 2 point gait pattern and use of least restrictive assistive devce to improve household mobility.    Baseline Pt. able to ambulate with use of RW/ CGA to min. A for safety.   Heavy use of B UE.; 9/30 pt able to ambulate with rollator with inconsistent 2 point gait pattern, heavy reliance on UE CGA/supervision  2/9: Pt. uses 2 point step pattern with walking with rollader with decreased stride length. Pt. would be limited due to pain in the knee walking for >100 ft.; 8/10: Pt demonstrates ability to ambulate 100' with consistent 2 point gait pattern with rollator.    Time 0    Period Weeks    Status Achieved      PT LONG TERM GOAL #4   Title Pt. able to stand from normal chair with no UE assist to improve safety/independence with transfers.     Baseline Unable to stand from standard chair without heavy UE assist. 10/6, pt unable to stand from standard chair without UE support.  11/10: benefits from 1 UE assist    2/9: Pt. can rise from normal chair with 1 UE assist and must control descent with UE assist.; 8/10: Pt requires 2 UE for standing from standard chair height.; 10/5: Requires single UE to stand.    Time 8    Period  Weeks    Status Partially Met    Target Date 10/28/20      PT LONG TERM GOAL #5   Title Pt. will perform a sit to stand with 1 UE support to be able to perform toileting activities with more independance.    Baseline Pt. requires 2 UE to push from  to sit to stand from standard chair height. Pt. has grab rails available but can not get up by pulling.; 8/10: pt requires 2 UE's to STS from standard chair height.; 10/5: Only required single UE support to stand. Use of momentum but able to perform independently.    Time 8    Period Weeks    Status Achieved      PT LONG TERM GOAL #6   Title Pt. will ascend/ descend 4 steps with use of B handrails and step pattern with mod. independence safely to be able to enter son's house.    Baseline Step pattern with heavy UE assist and CGA/min. A for safety.; 8/10: Pt safely asc/desc 4 steps with B hand rails and step to pattern with mod indep to enter son's house safely.    Time 0    Period Weeks    Status Achieved      PT LONG TERM GOAL #7   Title Pt will be able to stand at bathroom sink for 5 min with no UE support to complete ADL's like brushing his teeth, washing his hands, and combing his hair.    Baseline 8/10: Ability to stand with no UE support for 2 min.; 10/5: 3.5 min due to LBP. Has demonstrated in previous PT sessions he can stand for 5 min.    Time 8    Period Weeks    Status Partially Met    Target Date 10/28/20      PT LONG TERM GOAL #8   Title Pt will demonstrate ability to stand from a lowered surface below 18" with single Ue support to demonstrate improved BLE strength to improve transfers from toilet.    Baseline 10/5: Able to stand from 18" chair with single UE support with use of momentum.    Time 8    Period Weeks    Status Partially Met    Target Date 10/28/20                 Plan - 09/16/20 1338    Clinical Impression Statement Pt. reports slight increase in back pain during prolonged standing ther.ex./ walking in  //-bars.  Pt. only required a few short seated rest breaks.  Pt. walking around clinic with mod. independence and use of rollator with recip. pattern (heavy UE assist).  No loss of balance during tx. session and pt. standing from gray chair with proper technique and less UE assist required.    Personal Factors and Comorbidities Age    Examination-Activity Limitations Toileting;Stand;Squat;Lift;Stairs    Stability/Clinical Decision Making Evolving/Moderate complexity    Clinical Decision Making Moderate    Rehab Potential Fair    PT Frequency 1x / week    PT Duration 8 weeks    PT Treatment/Interventions Moist Heat;Functional mobility training;Therapeutic activities;Therapeutic exercise;Balance training;Patient/family education;Manual techniques;Passive range of motion;Neuromuscular re-education;Stair training;Gait training;ADLs/Self Care Home Management    PT Next Visit Plan Progress hip flexion/ step pattern with less UE assist.    PT Home Exercise Plan [facility]    Consulted and Agree with Plan of Care Patient;Family member/caregiver    Family Member Consulted spouse: [name]           Patient will benefit from skilled therapeutic intervention in order to improve the following deficits and impairments:  Abnormal gait,Decreased endurance,Decreased activity tolerance,Pain,Decreased balance,Impaired flexibility,Decreased strength,Postural dysfunction  Visit Diagnosis: Muscle weakness (generalized)  Gait difficulty  Abnormal posture  Balance problem     Problem List Patient Active Problem List  Diagnosis Date Noted  . DVT femoral (deep venous thrombosis) with thrombophlebitis, left ([place]) 02/06/2020  . Lymphedema 04/15/2019  . Bilateral leg edema 01/25/2019  . Hyponatremia 12/24/2018  . SIADH (syndrome of inappropriate ADH production) ([place]) 12/24/2018  . Erosion of urethra due to catheterization of urinary tract ([place]) 10/27/2018  . Generalized weakness 10/14/2018  . Hip fracture  ([place]) 09/15/2018  . 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 HLD (hyperlipidemia) 03/24/2018  . Contusion of right knee 02/14/2018  . Bradycardia 12/28/2017  . 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 Tremor 09/04/2016  . Chronic pain of right knee 08/10/2016  . Right ankle pain 08/10/2016  . 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  . 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[place]) 12/11/2015  . Urinary retention 09/08/2015  . Lymphoma, 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[place]) 04/03/2015  . Breathlessness on exertion 11/21/2014  . Breath shortness 11/21/2014  . Arthropathy 11/07/2014  . Atrial flutter, paroxysmal ([place]) 11/07/2014  . Type 2 diabetes mellitus ([employer]) 11/07/2014  . Benign essential tremor 11/07/2014  . Benign essential HTN 11/07/2014  . Mixed incontinence 11/07/2014  . Hypercholesterolemia without hypertriglyceridemia 11/07/2014  . Apnea, sleep 11/07/2014  . Controlled type 2 diabetes mellitus without complication ([employer]) 47/18/5501  . Pure hypercholesterolemia 11/07/2014  . Other abnormalities of gait and mobility 11/02/2011  . Decreased mobility 11/02/2011  . Abnormal gait 06/29/2011  . Discoordination 06/29/2011   Pura Spice, PT, DPT # [id number] 09/17/2020, 7:17 PM  [facility] [address] [place], [place], [address] Phone: [phone]   Fax:  [phone]  Name: Mike Holloway MRN: [id number] Date of Birth: [birth year]

## 2020-09-22 ENCOUNTER — Other Ambulatory Visit: Payer: Self-pay | Admitting: Podiatry

## 2020-09-22 ENCOUNTER — Ambulatory Visit
Admission: RE | Admit: 2020-09-22 | Discharge: 2020-09-22 | Disposition: A | Discharge status: Home / Self Care | Payer: Medicare Other | Source: Ambulatory Visit | Attending: Podiatry | Admitting: Podiatry

## 2020-09-22 ENCOUNTER — Other Ambulatory Visit: Payer: Self-pay

## 2020-09-22 ENCOUNTER — Other Ambulatory Visit: Payer: Self-pay | Admitting: Internal Medicine

## 2020-09-22 DIAGNOSIS — M79662 Pain in left lower leg: Secondary | ICD-10-CM

## 2020-09-22 DIAGNOSIS — R600 Localized edema: Secondary | ICD-10-CM | POA: Diagnosis not present

## 2020-09-23 ENCOUNTER — Telehealth: Payer: Self-pay | Admitting: *Deleted

## 2020-09-23 ENCOUNTER — Encounter: Payer: Self-pay | Admitting: Physical Therapy

## 2020-09-23 ENCOUNTER — Ambulatory Visit: Payer: Medicare Other | Admitting: Physical Therapy

## 2020-09-23 DIAGNOSIS — R293 Abnormal posture: Secondary | ICD-10-CM

## 2020-09-23 DIAGNOSIS — M6281 Muscle weakness (generalized): Secondary | ICD-10-CM

## 2020-09-23 DIAGNOSIS — R269 Unspecified abnormalities of gait and mobility: Secondary | ICD-10-CM

## 2020-09-23 DIAGNOSIS — R2689 Other abnormalities of gait and mobility: Secondary | ICD-10-CM

## 2020-09-23 NOTE — Telephone Encounter (Addendum)
Patient's wife called regarding follow up due to balanitis. Was seen at [facility] ER 09/18/20.Patient has been taking antibiotic as prescribed. Denies any other urinary symptoms-foley still in place and draining properly.Keep follow up 10/16/20 per Marlena Clipper. Wife concerned ongoing leg and hand edema, advised to contact PCP. Voiced understanding.

## 2020-09-23 NOTE — Therapy (Signed)
[facility] [address] [place], [place], [address] Phone: [phone]   Fax:  [phone]  Physical Therapy Treatment  Patient Details  Name: Mike Holloway MRN: [id number] Date of Birth: [birth year] Referring Provider (PT): Dr. Rudene Christians   Encounter Date: 09/23/2020   PT End of Session - 09/23/20 1952    Visit Number 92    Number of Visits 62    Date for PT Re-Evaluation 10/28/20    Authorization - Visit Number 4    Authorization - Number of Visits 10    PT Start Time 1344    PT Stop Time 1442    PT Time Calculation (min) 58 min    Equipment Utilized During Treatment Gait belt    Activity Tolerance Patient tolerated treatment well    Behavior During Therapy WFL for tasks assessed/performed           Past Medical History:  Diagnosis Date   Arthritis    Atrial flutter ([place])    Diabetes mellitus ([place])    Essential tremor    Essential tremor    deep brain stimulator    Hypercholesteremia    Hypertension    Incontinence    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   SIADH (syndrome of inappropriate ADH production) ([place])    Sleep apnea    Stroke [facility])     Past Surgical History:  Procedure Laterality Date   ABLATION     APPENDECTOMY     CATARACT EXTRACTION, BILATERAL     COLONOSCOPY WITH PROPOFOL N/A 03/17/2018   Procedure: COLONOSCOPY WITH PROPOFOL;  Surgeon: Jonathon Bellows, MD;  Location: [facility] 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   Subjective Assessment - 09/23/20 1950    Subjective Pts. wife called prior to tx. session to state that pt. is being treated for UTI and went to ER to assess for L lower leg blood clot.  Pt. states the ultrasound was negative and pt. presents with L lower leg swelling (wearing compression socks).    Patient is accompained by: Family member    Pertinent History Pt. states R knee is bothering him but it is the typical discomfort he experiences    Limitations House hold activities;Walking;Standing;Lifting    Patient Stated Goals Improve LE strength/ gait and balance with daily tasks.  Improve gait/ safety/ progression to least assistive device.    Currently in Pain? No/denies    Pain Onset More than a month ago    Pain Onset More than a month ago           Neuro:   STS from blue mat table with limited UE assist 4x (no rollator in front of pt.)  Walking in clinic/ hallway with rollator working on maintaining upright posture,  relaxing upper trap musculature and decreasing wt. Through B UE.   Pt. Ambulated 175 feet before seated rest break  There.ex.:  3# ankle wts. (walking in //-bars/ stepping over hurdles/ standing hip abduction/ step lunges/ seated heel and toe raises)- 20x  Reviewed HEP  Nustep L5 10 min. B UE with MH to low back.       PT Long Term Goals - 09/03/20 1422      PT LONG TERM GOAL #1   Title Pt. independent with HEP to increase B hip flexion/ R quad muscle strength 1/2 muscle grade to improve pain-free mobility.    Baseline R knee extension limited to 4/5 MMT secondary to pain/ fear of pain. 2/9: Deferred.  4/13: 4+/5 MMT (max potential).    Time 4    Period Weeks    Status Achieved      PT LONG TERM GOAL #2   Title Pt. will increase Berg balance test to >40 out of 56  to improve independence with gait/ decrease fall risk.    Baseline Berg: 22/56 (significant fall risk); 9/30 32/56.  11/10: 32 (limited by R knee pain)  2/9: 33/56 limited due to R knee and Low Back pain; 8/10: 29/56; 10/5: 38/56    Time 8    Period Weeks    Status Partially Met    Target Date 10/28/20      PT LONG TERM GOAL #3   Title Pt. able to ambulate 100 feet with consistent 2 point gait pattern and use of least restrictive assistive devce to improve household mobility.    Baseline Pt. able to ambulate with use of RW/ CGA to min. A for safety.   Heavy use of B UE.; 9/30 pt able to ambulate with rollator with inconsistent 2 point gait pattern, heavy reliance on UE CGA/supervision  2/9: Pt. uses 2 point step pattern with walking with rollader with decreased stride length. Pt. would be limited due to pain in the knee walking for >100 ft.; 8/10: Pt demonstrates ability to ambulate 100' with consistent 2 point gait pattern with rollator.    Time 0    Period Weeks    Status Achieved      PT LONG TERM GOAL #4   Title Pt. able to stand from normal chair with no UE assist to improve safety/independence with transfers.     Baseline Unable to stand from standard chair without heavy UE assist. 10/6, pt unable to stand from standard chair without UE support.  11/10: benefits from 1 UE assist    2/9: Pt. can rise from normal chair with 1 UE assist and must control descent with UE assist.; 8/10: Pt requires 2 UE for standing from standard chair height.; 10/5: Requires single UE to stand.    Time 8    Period Weeks    Status Partially Met    Target Date 10/28/20      PT LONG TERM GOAL #5   Title Pt. will perform a sit to stand with 1 UE support to be able to perform toileting activities with more independance.    Baseline Pt. requires 2 UE to push from to sit to stand from standard chair height. Pt. has grab rails available but can not get up by pulling.; 8/10: pt requires 2 UE's to STS from standard  chair height.; 10/5: Only required single UE support to stand. Use of momentum but able to perform independently.    Time 8    Period Weeks  Status Achieved      PT LONG TERM GOAL #6   Title Pt. will ascend/ descend 4 steps with use of B handrails and step pattern with mod. independence safely to be able to enter son's house.    Baseline Step pattern with heavy UE assist and CGA/min. A for safety.; 8/10: Pt safely asc/desc 4 steps with B hand rails and step to pattern with mod indep to enter son's house safely.    Time 0    Period Weeks    Status Achieved      PT LONG TERM GOAL #7   Title Pt will be able to stand at bathroom sink for 5 min with no UE support to complete ADL's like brushing his teeth, washing his hands, and combing his hair.    Baseline 8/10: Ability to stand with no UE support for 2 min.; 10/5: 3.5 min due to LBP. Has demonstrated in previous PT sessions he can stand for 5 min.    Time 8    Period Weeks    Status Partially Met    Target Date 10/28/20      PT LONG TERM GOAL #8   Title Pt will demonstrate ability to stand from a lowered surface below 18" with single Ue support to demonstrate improved BLE strength to improve transfers from toilet.    Baseline 10/5: Able to stand from 18" chair with single UE support with use of momentum.    Time 8    Period Weeks    Status Partially Met    Target Date 10/28/20                 Plan - 09/23/20 1953    Clinical Impression Statement No L calf tenderness or redness noted prior to tx. session.  Pt. does present with L lower leg pitting edema as compared to R lower leg.  No c/o knee pain during standing ther.ex. but back discomfort/ fatigue during gait in hallway.  Pt. continues to work hard during tx. session and progressing with LE resisted ther.ex. during hip flexion/ step ups.  Pt. is scheduled to return home from [facility] next Friday (10/03/20).    Personal Factors and Comorbidities Age    Examination-Activity  Limitations Toileting;Stand;Squat;Lift;Stairs    Stability/Clinical Decision Making Evolving/Moderate complexity    Clinical Decision Making Moderate    Rehab Potential Fair    PT Frequency 1x / week    PT Duration 8 weeks    PT Treatment/Interventions Moist Heat;Functional mobility training;Therapeutic activities;Therapeutic exercise;Balance training;Patient/family education;Manual techniques;Passive range of motion;Neuromuscular re-education;Stair training;Gait training;ADLs/Self Care Home Management    PT Next Visit Plan Progress hip flexion/ step pattern with less UE assist.    PT Home Exercise Plan [facility]    Consulted and Agree with Plan of Care Patient;Family member/caregiver    Family Member Consulted spouse: [name]           Patient will benefit from skilled therapeutic intervention in order to improve the following deficits and impairments:  Abnormal gait,Decreased endurance,Decreased activity tolerance,Pain,Decreased balance,Impaired flexibility,Decreased strength,Postural dysfunction  Visit Diagnosis: Muscle weakness (generalized)  Gait difficulty  Abnormal posture  Balance problem     Problem List Patient Active Problem List   Diagnosis Date Noted   DVT femoral (deep venous thrombosis) with thrombophlebitis, left ([place]) 02/06/2020   Lymphedema 04/15/2019   Bilateral leg edema 01/25/2019   Hyponatremia 12/24/2018   SIADH (syndrome of inappropriate ADH production) ([place]) 12/24/2018   Erosion of urethra due to catheterization  of urinary tract ([place]) 10/27/2018   Generalized weakness 10/14/2018   Hip fracture ([place]) 09/15/2018   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   HLD (hyperlipidemia) 03/24/2018   Contusion of right knee 02/14/2018   Bradycardia 12/28/2017   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   Tremor 09/04/2016   Chronic  pain of right knee 08/10/2016   Right ankle pain 08/10/2016   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[place]) 12/11/2015   Urinary retention 09/08/2015   Lymphoma, 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[place]) 04/03/2015   Breathlessness on exertion 11/21/2014   Breath shortness 11/21/2014   Arthropathy 11/07/2014   Atrial flutter, paroxysmal ([place]) 11/07/2014   Type 2 diabetes mellitus ([place]) 11/07/2014   Benign essential tremor 11/07/2014   Benign essential HTN 11/07/2014   Mixed incontinence 11/07/2014   Hypercholesterolemia without hypertriglyceridemia 11/07/2014   Apnea, sleep 11/07/2014   Controlled type 2 diabetes mellitus without complication ([place]) 19/16/6060   Pure hypercholesterolemia 11/07/2014   Other abnormalities of gait and mobility 11/02/2011   Decreased mobility 11/02/2011   Abnormal gait 06/29/2011   Discoordination 06/29/2011   Pura Spice, PT, DPT # [phone] 09/23/2020, 7:58 PM  [facility] [address] [place], [place], [address] Phone: [phone]   Fax:  [phone]  Name: Mike Holloway MRN: [id number] Date of Birth: 14-Apr-1941

## 2020-09-25 ENCOUNTER — Ambulatory Visit: Payer: Self-pay | Admitting: Physician Assistant

## 2020-09-30 ENCOUNTER — Encounter: Payer: Medicare Other | Admitting: Physical Therapy

## 2020-10-07 ENCOUNTER — Ambulatory Visit: Payer: Medicare Other | Attending: Orthopedic Surgery | Admitting: Physical Therapy

## 2020-10-07 ENCOUNTER — Other Ambulatory Visit: Payer: Self-pay

## 2020-10-07 ENCOUNTER — Encounter: Payer: Self-pay | Admitting: Physical Therapy

## 2020-10-07 DIAGNOSIS — R2689 Other abnormalities of gait and mobility: Secondary | ICD-10-CM | POA: Diagnosis present

## 2020-10-07 DIAGNOSIS — R293 Abnormal posture: Secondary | ICD-10-CM | POA: Diagnosis present

## 2020-10-07 DIAGNOSIS — M6281 Muscle weakness (generalized): Secondary | ICD-10-CM | POA: Diagnosis present

## 2020-10-07 DIAGNOSIS — R269 Unspecified abnormalities of gait and mobility: Secondary | ICD-10-CM | POA: Diagnosis present

## 2020-10-08 NOTE — Therapy (Signed)
[facility] [address] [place], [place], [address] Phone: [phone]   Fax:  [phone]  Physical Therapy Treatment  Patient Details  Name: Mike Holloway MRN: [id number] Date of Birth: 1941/08/01 Referring Provider (PT): Dr. Rudene Christians   Encounter Date: 10/07/2020   PT End of Session - 10/08/20 1706    Visit Number 63    Number of Visits 86    Date for PT Re-Evaluation 10/28/20    Authorization - Visit Number 5    Authorization - Number of Visits 10    PT Start Time 1304    PT Stop Time 1406    PT Time Calculation (min) 62 min    Equipment Utilized During Treatment Gait belt    Activity Tolerance Patient tolerated treatment well    Behavior During Therapy WFL for tasks assessed/performed           Past Medical History:  Diagnosis Date  . Arthritis   . Atrial flutter ([place])   . Diabetes mellitus ([place])   . Essential tremor   . Essential tremor    deep brain stimulator   . Hypercholesteremia   . Hypertension   . Incontinence   . 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 SIADH (syndrome of inappropriate ADH production) ([place])   . Sleep apnea   . Stroke [facility])     Past Surgical History:  Procedure Laterality Date  . ABLATION    . APPENDECTOMY    . CATARACT EXTRACTION, BILATERAL    . COLONOSCOPY WITH PROPOFOL N/A 03/17/2018   Procedure: COLONOSCOPY WITH PROPOFOL;  Surgeon: Jonathon Bellows, MD;  Location: [facility] ENDOSCOPY;  Service: Gastroenterology;  Laterality: N/A;  . DEEP BRAIN STIMULATOR PLACEMENT    . 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 ENDOSCOPY;  Service: Gastroenterology;  Laterality: N/A;  . HEMORRHOID SURGERY    . HERNIA REPAIR    . HIP FRACTURE SURGERY    . INTRAMEDULLARY (IM) NAIL INTERTROCHANTERIC Right 09/16/2018   Procedure: INTRAMEDULLARY (IM) NAIL INTERTROCHANTRIC;  Surgeon: Hessie Knows, MD;  Location: [facility] ORS;  Service:  Orthopedics;  Laterality: Right;  . IR CATHETER TUBE CHANGE  11/22/2018  . NASAL SINUS SURGERY    . ORCHIECTOMY    . TONSILLECTOMY      There were no vitals filed for this visit.   Subjective Assessment - 10/07/20 1303    Subjective Pt. has returned back home with wife after a several week stay at [facility].  Pts. wife states pt. is getting over a UTI and upper respiratory infection.  Pts. wife states he is coughing but doing better.    Patient is accompained by: Family member    Pertinent History Pt. states R knee is bothering him but it is the typical discomfort he experiences    Limitations House hold activities;Walking;Standing;Lifting    Patient Stated Goals Improve LE strength/ gait and balance with daily tasks.  Improve gait/ safety/ progression to least assistive device.    Currently in Pain? No/denies    Pain Onset More than a month ago    Pain Onset More than a month ago           There.ex.:  4# ankle wts.: standing hip abduction/ marching/ walking in //-bars with recip pattern and upright posture- 20x  Seated 4# ankle wts.: marching/ LAQ/ heel and toe raises 20x.    Discussed/ reviewed HEP  Nustep L5 10 min. B UE with MH to low back.     Neuro:   Walking in clinic/ hallway with rollator working on maintaining upright posture, relaxing upper trap musculature and decreasing wt. Through B UE.   Pt. Ambulated 210 feet before seated rest break  Standing fishing task 2x with rest break required before 5 minutes.  No UE assist on //-bars.  Standing wt. Shift (forward/ lateral)- posture correction in //-bars.     PT Long Term Goals - 09/03/20 1422      PT LONG TERM GOAL #1   Title Pt. independent with HEP to increase B hip flexion/ R quad muscle strength 1/2 muscle grade to improve pain-free mobility.    Baseline R knee extension limited to 4/5 MMT secondary to pain/ fear of pain. 2/9: Deferred.  4/13: 4+/5 MMT (max potential).    Time 4    Period Weeks     Status Achieved      PT LONG TERM GOAL #2   Title Pt. will increase Berg balance test to >40 out of 56 to improve independence with gait/ decrease fall risk.    Baseline Berg: 22/56 (significant fall risk); 9/30 32/56.  11/10: 32 (limited by R knee pain)  2/9: 33/56 limited due to R knee and Low Back pain; 8/10: 29/56; 10/5: 38/56    Time 8    Period Weeks    Status Partially Met    Target Date 10/28/20      PT LONG TERM GOAL #3   Title Pt. able to ambulate 100 feet with consistent 2 point gait pattern and use of least restrictive assistive devce to improve household mobility.    Baseline Pt. able to ambulate with use of RW/ CGA to min. A for safety.   Heavy use of B UE.; 9/30 pt able to ambulate with rollator with inconsistent 2 point gait pattern, heavy reliance on UE CGA/supervision  2/9: Pt. uses 2 point step pattern with walking with rollader with decreased stride length. Pt. would be limited due to pain in the knee walking for >100 ft.; 8/10: Pt demonstrates ability to ambulate 100' with consistent 2 point gait pattern with rollator.    Time 0    Period Weeks    Status Achieved      PT LONG TERM GOAL #4   Title Pt. able to stand from normal chair with no UE assist to improve safety/independence with transfers.     Baseline Unable to stand from standard chair without heavy UE assist. 10/6, pt unable to stand from standard chair without UE support.  11/10: benefits from 1 UE assist    2/9: Pt. can rise from normal chair with 1 UE assist and must control descent with UE assist.; 8/10: Pt requires 2 UE for standing from standard chair height.; 10/5: Requires single UE to stand.    Time 8    Period Weeks    Status Partially Met    Target Date 10/28/20      PT LONG TERM GOAL #5   Title Pt. will perform a sit to stand with 1 UE support to be able to perform toileting activities with more independance.    Baseline Pt. requires 2 UE to push from to sit to stand from standard chair height.  Pt. has grab rails available but can not get up by pulling.; 8/10: pt requires 2 UE's to STS from standard chair height.; 10/5: Only required single UE support to stand. Use of momentum  but able to perform independently.    Time 8    Period Weeks    Status Achieved      PT LONG TERM GOAL #6   Title Pt. will ascend/ descend 4 steps with use of B handrails and step pattern with mod. independence safely to be able to enter son's house.    Baseline Step pattern with heavy UE assist and CGA/min. A for safety.; 8/10: Pt safely asc/desc 4 steps with B hand rails and step to pattern with mod indep to enter son's house safely.    Time 0    Period Weeks    Status Achieved      PT LONG TERM GOAL #7   Title Pt will be able to stand at bathroom sink for 5 min with no UE support to complete ADL's like brushing his teeth, washing his hands, and combing his hair.    Baseline 8/10: Ability to stand with no UE support for 2 min.; 10/5: 3.5 min due to LBP. Has demonstrated in previous PT sessions he can stand for 5 min.    Time 8    Period Weeks    Status Partially Met    Target Date 10/28/20      PT LONG TERM GOAL #8   Title Pt will demonstrate ability to stand from a lowered surface below 18" with single Ue support to demonstrate improved BLE strength to improve transfers from toilet.    Baseline 10/5: Able to stand from 18" chair with single UE support with use of momentum.    Time 8    Period Weeks    Status Partially Met    Target Date 10/28/20                 Plan - 10/08/20 1707    Clinical Impression Statement Pt. continues to wear B lower leg compression stockings and presents with a moderate productive cough.  Pt. states he is ready for PT and looking forward to working on strengthening/ balance.  Lower back fatigue/ discomfort during static standing with dynamic reaching tasks in //-bars.  Pt. limited to 5 minute max with standing unsupported and completing fishing tasks.  Good overall  tx. endurance and motivation present during standing ther.ex./ walking.  No LOB while walking but heavy UE assist due to knee discomfort.    Personal Factors and Comorbidities Age    Examination-Activity Limitations Toileting;Stand;Squat;Lift;Stairs    Stability/Clinical Decision Making Evolving/Moderate complexity    Clinical Decision Making Moderate    Rehab Potential Fair    PT Frequency 1x / week    PT Duration 8 weeks    PT Treatment/Interventions Moist Heat;Functional mobility training;Therapeutic activities;Therapeutic exercise;Balance training;Patient/family education;Manual techniques;Passive range of motion;Neuromuscular re-education;Stair training;Gait training;ADLs/Self Care Home Management    PT Next Visit Plan Progress hip flexion/ step pattern with less UE assist.    PT Home Exercise Plan [facility]    Consulted and Agree with Plan of Care Patient;Family member/caregiver    Family Member Consulted spouse: [name]           Patient will benefit from skilled therapeutic intervention in order to improve the following deficits and impairments:  Abnormal gait,Decreased endurance,Decreased activity tolerance,Pain,Decreased balance,Impaired flexibility,Decreased strength,Postural dysfunction  Visit Diagnosis: Muscle weakness (generalized)  Gait difficulty  Abnormal posture  Balance problem     Problem List Patient Active Problem List   Diagnosis Date Noted  . DVT femoral (deep venous thrombosis) with thrombophlebitis, left ([place]) 02/06/2020  . Lymphedema 04/15/2019  .  Bilateral leg edema 01/25/2019  . Hyponatremia 12/24/2018  . SIADH (syndrome of inappropriate ADH production) ([place]) 12/24/2018  . Erosion of urethra due to catheterization of urinary tract ([place]) 10/27/2018  . Generalized weakness 10/14/2018  . Hip fracture ([place]) 09/15/2018  . 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 HLD (hyperlipidemia) 03/24/2018  . Contusion of right knee 02/14/2018  . Bradycardia 12/28/2017  . 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 Tremor 09/04/2016  . Chronic pain of right knee 08/10/2016  . Right ankle pain 08/10/2016  . 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  . 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[place]) 12/11/2015  . Urinary retention 09/08/2015  . Lymphoma, 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[place]) 04/03/2015  . Breathlessness on exertion 11/21/2014  . Breath shortness 11/21/2014  . Arthropathy 11/07/2014  . Atrial flutter, paroxysmal ([place]) 11/07/2014  . Type 2 diabetes mellitus ([place]) 11/07/2014  . Benign essential tremor 11/07/2014  . Benign essential HTN 11/07/2014  . Mixed incontinence 11/07/2014  . Hypercholesterolemia without hypertriglyceridemia 11/07/2014  . Apnea, sleep 11/07/2014  . Controlled type 2 diabetes mellitus without complication ([place]) 25/00/3704  . Pure hypercholesterolemia 11/07/2014  . Other abnormalities of gait and mobility 11/02/2011  . Decreased mobility 11/02/2011  . Abnormal gait 06/29/2011  . Discoordination 06/29/2011   Pura Spice, PT, DPT # [phone] 10/08/2020, 5:11 PM  [place] [facility] [address] [place], [place], [address] Phone: [phone]   Fax:  [phone]  Name: Zedrick Springsteen MRN: [id number] Date of Birth: 1940/12/30

## 2020-10-13 ENCOUNTER — Ambulatory Visit: Payer: Medicare Other | Admitting: Physical Therapy

## 2020-10-13 ENCOUNTER — Ambulatory Visit: Payer: Self-pay | Admitting: Physician Assistant

## 2020-10-14 ENCOUNTER — Ambulatory Visit: Payer: Medicare Other | Admitting: Physical Therapy

## 2020-10-16 ENCOUNTER — Ambulatory Visit (INDEPENDENT_AMBULATORY_CARE_PROVIDER_SITE_OTHER): Payer: Medicare Other | Admitting: Physician Assistant

## 2020-10-16 ENCOUNTER — Other Ambulatory Visit: Payer: Self-pay

## 2020-10-16 DIAGNOSIS — N368 Other specified disorders of urethra: Secondary | ICD-10-CM

## 2020-10-16 DIAGNOSIS — R339 Retention of urine, unspecified: Secondary | ICD-10-CM | POA: Diagnosis not present

## 2020-10-16 NOTE — Progress Notes (Signed)
Cath Change/ Replacement  Patient is present today for a catheter change due to urinary retention.  51ml of water was removed from the balloon, a 16FR coude foley cath was removed without difficulty.  Patient was cleaned and prepped in a sterile fashion with betadine. A 16 FR coude foley cath was replaced into the bladder no complications were noted Urine return was noted 62ml and urine was yellow in color. The balloon was filled with 74ml of sterile water. A leg bag was attached for drainage.  A night bag was also given to the patient and patient was given instruction on how to change from one bag to another. Patient tolerated well.    Performed by: Debroah Loop, PA-C   Additional notes: New site of right-sided catheter erosion noted today. Switched bag to left leg to promote healing. Offer abx, patient declined due to history of C diff. Counseled patient to treat area topically with bacitracin if concern for infection. He expressed understanding.  Follow up: Return in about 4 weeks (around 11/13/2020) for Catheter exchange.

## 2020-10-17 ENCOUNTER — Telehealth: Payer: Self-pay | Admitting: *Deleted

## 2020-10-17 NOTE — Telephone Encounter (Signed)
Pt wife calling stating that pt had cath changed yesterday and pt is ok while he is standing, but sitting is very uncomfortable. Pt states it feels like it's pulling, please advise

## 2020-10-17 NOTE — Telephone Encounter (Signed)
LMOM advising patient of what he may do to fix the pulling on his catheter. Advised patient to call back should he have any questions.

## 2020-10-17 NOTE — Telephone Encounter (Signed)
As discussed in clinic yesterday, if the catheter is pulling they may reposition it by using an alcohol swab to remove the StatLock and place a new StatLock that puts more slack on the line (they'd need to place the new one higher on his leg/closer to his groin to accomplish this). I gave him an extra StatLock for this purpose.

## 2020-10-21 ENCOUNTER — Ambulatory Visit: Payer: Medicare Other | Admitting: Physical Therapy

## 2020-10-21 ENCOUNTER — Encounter: Payer: Self-pay | Admitting: Physical Therapy

## 2020-10-21 ENCOUNTER — Other Ambulatory Visit: Payer: Self-pay

## 2020-10-21 DIAGNOSIS — R269 Unspecified abnormalities of gait and mobility: Secondary | ICD-10-CM

## 2020-10-21 DIAGNOSIS — M6281 Muscle weakness (generalized): Secondary | ICD-10-CM

## 2020-10-21 DIAGNOSIS — R2689 Other abnormalities of gait and mobility: Secondary | ICD-10-CM

## 2020-10-21 DIAGNOSIS — R293 Abnormal posture: Secondary | ICD-10-CM

## 2020-10-21 NOTE — Therapy (Signed)
[facility] [address] [place], [place], [address] Phone: [phone]   Fax:  [phone]  Physical Therapy Treatment  Patient Details  Name: Mike Holloway MRN: [id number] Date of Birth: 01-Jun-1941 Referring Provider (PT): Dr. Rudene Christians   Encounter Date: 10/21/2020   PT End of Session - 10/21/20 1312    Visit Number 82    Number of Visits 59    Date for PT Re-Evaluation 10/28/20    Authorization - Visit Number 6    Authorization - Number of Visits 10    PT Start Time 1300    PT Stop Time 2376    PT Time Calculation (min) 65 min    Equipment Utilized During Treatment Gait belt    Activity Tolerance Patient tolerated treatment well    Behavior During Therapy [facility] for tasks assessed/performed           Past Medical History:  Diagnosis Date  . Arthritis   . Atrial flutter ([place])   . Diabetes mellitus ([place])   . Essential tremor   . Essential tremor    deep brain stimulator   . Hypercholesteremia   . Hypertension   . Incontinence   . 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[place])    grew on the testical  . SIADH (syndrome of inappropriate ADH production) ([place])   . Sleep apnea   . Stroke [facility])     Past Surgical History:  Procedure Laterality Date  . ABLATION    . APPENDECTOMY    . CATARACT EXTRACTION, BILATERAL    . COLONOSCOPY WITH PROPOFOL N/A 03/17/2018   Procedure: COLONOSCOPY WITH PROPOFOL;  Surgeon: Jonathon Bellows, MD;  Location: [facility] ENDOSCOPY;  Service: Gastroenterology;  Laterality: N/A;  . DEEP BRAIN STIMULATOR PLACEMENT    . 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 ENDOSCOPY;  Service: Gastroenterology;  Laterality: N/A;  . HEMORRHOID SURGERY    . HERNIA REPAIR    . HIP FRACTURE SURGERY    . INTRAMEDULLARY (IM) NAIL INTERTROCHANTERIC Right 09/16/2018   Procedure: INTRAMEDULLARY (IM) NAIL INTERTROCHANTRIC;  Surgeon: Hessie Knows, MD;  Location: [facility] ORS;  Service:  Orthopedics;  Laterality: Right;  . IR CATHETER TUBE CHANGE  11/22/2018  . NASAL SINUS SURGERY    . ORCHIECTOMY    . TONSILLECTOMY      There were no vitals filed for this visit.   Subjective Assessment - 10/21/20 1309    Subjective Pt reports a 1/10 pain in the R knee, but no issues with the LB today. Pt reports no falls recently and he was sick with a cough last week, but he is better now. He continues to have some bladder problems.    Patient is accompained by: Family member    Pertinent History Pt. states R knee is bothering him but it is the typical discomfort he experiences    Limitations House hold activities;Walking;Standing;Lifting    Patient Stated Goals Improve LE strength/ gait and balance with daily tasks.  Improve gait/ safety/ progression to least assistive device.    Currently in Pain? Yes    Pain Score 1     Pain Location Knee    Pain Orientation Right    Pain Onset More than a month ago    Pain Onset More than a month ago          There.ex.:  Nustep L5 10 min seat #12. B UE with MH to low back.  //  bars: 4# ankle wts.: high knees 5 laps, side stepping 4 laps, standing hip abd  Seated 4# ankle wts.: marching/ LAQ x20   Sit to stands in front of //-bars 5x.  At stairs: step taps x1 min alternating, step ups (L only) x10    Neuro:   Walking in clinic/ hallway with rollator working on maintaining upright posture, relaxing upper trap musculature and decreasing wt. Through B UE. 4 laps around clinic.   Standing fishing task 2x with rest break required before 5 minutes.  No UE assist on //bars.  Seated rest breaks taken between all sets         PT Long Term Goals - 09/03/20 1422      PT LONG TERM GOAL #1   Title Pt. independent with HEP to increase B hip flexion/ R quad muscle strength 1/2 muscle grade to improve pain-free mobility.    Baseline R knee extension limited to 4/5 MMT secondary to pain/ fear of pain. 2/9: Deferred.  4/13: 4+/5 MMT  (max potential).    Time 4    Period Weeks    Status Achieved      PT LONG TERM GOAL #2   Title Pt. will increase Berg balance test to >40 out of 56 to improve independence with gait/ decrease fall risk.    Baseline Berg: 22/56 (significant fall risk); 9/30 32/56.  11/10: 32 (limited by R knee pain)  2/9: 33/56 limited due to R knee and Low Back pain; 8/10: 29/56; 10/5: 38/56    Time 8    Period Weeks    Status Partially Met    Target Date 10/28/20      PT LONG TERM GOAL #3   Title Pt. able to ambulate 100 feet with consistent 2 point gait pattern and use of least restrictive assistive devce to improve household mobility.    Baseline Pt. able to ambulate with use of RW/ CGA to min. A for safety.   Heavy use of B UE.; 9/30 pt able to ambulate with rollator with inconsistent 2 point gait pattern, heavy reliance on UE CGA/supervision  2/9: Pt. uses 2 point step pattern with walking with rollader with decreased stride length. Pt. would be limited due to pain in the knee walking for >100 ft.; 8/10: Pt demonstrates ability to ambulate 100' with consistent 2 point gait pattern with rollator.    Time 0    Period Weeks    Status Achieved      PT LONG TERM GOAL #4   Title Pt. able to stand from normal chair with no UE assist to improve safety/independence with transfers.     Baseline Unable to stand from standard chair without heavy UE assist. 10/6, pt unable to stand from standard chair without UE support.  11/10: benefits from 1 UE assist    2/9: Pt. can rise from normal chair with 1 UE assist and must control descent with UE assist.; 8/10: Pt requires 2 UE for standing from standard chair height.; 10/5: Requires single UE to stand.    Time 8    Period Weeks    Status Partially Met    Target Date 10/28/20      PT LONG TERM GOAL #5   Title Pt. will perform a sit to stand with 1 UE support to be able to perform toileting activities with more independance.    Baseline Pt. requires 2 UE to push  from to sit to stand from standard chair height. Pt. has grab  rails available but can not get up by pulling.; 8/10: pt requires 2 UE's to STS from standard chair height.; 10/5: Only required single UE support to stand. Use of momentum but able to perform independently.    Time 8    Period Weeks    Status Achieved      PT LONG TERM GOAL #6   Title Pt. will ascend/ descend 4 steps with use of B handrails and step pattern with mod. independence safely to be able to enter son's house.    Baseline Step pattern with heavy UE assist and CGA/min. A for safety.; 8/10: Pt safely asc/desc 4 steps with B hand rails and step to pattern with mod indep to enter son's house safely.    Time 0    Period Weeks    Status Achieved      PT LONG TERM GOAL #7   Title Pt will be able to stand at bathroom sink for 5 min with no UE support to complete ADL's like brushing his teeth, washing his hands, and combing his hair.    Baseline 8/10: Ability to stand with no UE support for 2 min.; 10/5: 3.5 min due to LBP. Has demonstrated in previous PT sessions he can stand for 5 min.    Time 8    Period Weeks    Status Partially Met    Target Date 10/28/20      PT LONG TERM GOAL #8   Title Pt will demonstrate ability to stand from a lowered surface below 18" with single Ue support to demonstrate improved BLE strength to improve transfers from toilet.    Baseline 10/5: Able to stand from 18" chair with single UE support with use of momentum.    Time 8    Period Weeks    Status Partially Met    Target Date 10/28/20                 Plan - 10/21/20 1417    Clinical Impression Statement Pt continues to demo deficits in overall endurance and balance wich results in increased risk of falling, need for Rollator, and decreased prolonged WB tolerance. Pt requires gait belt and CGA with all standing exercises due to decreased balance. Pt had 3 instances of LoB during fishing activity that required min to mod A for  correction.    Personal Factors and Comorbidities Age    Examination-Activity Limitations Toileting;Stand;Squat;Lift;Stairs    Stability/Clinical Decision Making Evolving/Moderate complexity    Clinical Decision Making Moderate    Rehab Potential Fair    PT Frequency 1x / week    PT Duration 8 weeks    PT Treatment/Interventions Moist Heat;Functional mobility training;Therapeutic activities;Therapeutic exercise;Balance training;Patient/family education;Manual techniques;Passive range of motion;Neuromuscular re-education;Stair training;Gait training;ADLs/Self Care Home Management    PT Next Visit Plan Continue to increase standing balance and stability to decrease risk of falls    PT Home Exercise Plan [email or web address]    Consulted and Agree with Plan of Care Patient;Family member/caregiver    Family Member Consulted spouse: [name]           Patient will benefit from skilled therapeutic intervention in order to improve the following deficits and impairments:  Abnormal gait,Decreased endurance,Decreased activity tolerance,Pain,Decreased balance,Impaired flexibility,Decreased strength,Postural dysfunction  Visit Diagnosis: Muscle weakness (generalized)  Gait difficulty  Abnormal posture  Balance problem     Problem List Patient Active Problem List   Diagnosis Date Noted  . DVT femoral (deep venous thrombosis) with thrombophlebitis, left ([place]) 02/06/2020  .  Lymphedema 04/15/2019  . Bilateral leg edema 01/25/2019  . Hyponatremia 12/24/2018  . SIADH (syndrome of inappropriate ADH production) ([place]) 12/24/2018  . Erosion of urethra due to catheterization of urinary tract ([place]) 10/27/2018  . Generalized weakness 10/14/2018  . Hip fracture ([place]) 09/15/2018  . 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 HLD (hyperlipidemia) 03/24/2018  . Contusion of right knee 02/14/2018  . Bradycardia 12/28/2017   . 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 Tremor 09/04/2016  . Chronic pain of right knee 08/10/2016  . Right ankle pain 08/10/2016  . 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  . 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[place]) 12/11/2015  . Urinary retention 09/08/2015  . Lymphoma, 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[place]) 04/03/2015  . Breathlessness on exertion 11/21/2014  . Breath shortness 11/21/2014  . Arthropathy 11/07/2014  . Atrial flutter, paroxysmal ([place]) 11/07/2014  . Type 2 diabetes mellitus ([place]) 11/07/2014  . Benign essential tremor 11/07/2014  . Benign essential HTN 11/07/2014  . Mixed incontinence 11/07/2014  . Hypercholesterolemia without hypertriglyceridemia 11/07/2014  . Apnea, sleep 11/07/2014  . Controlled type 2 diabetes mellitus without complication ([place]) 16/06/9603  . Pure hypercholesterolemia 11/07/2014  . Other abnormalities of gait and mobility 11/02/2011  . Decreased mobility 11/02/2011  . Abnormal gait 06/29/2011  . Discoordination 06/29/2011   Kayleen Memos, SPT  Pura Spice, PT, DPT # [id number] 10/21/2020, 2:23 PM  [facility] [address] [place], [place], [address] Phone: [phone]   Fax:  [phone]  Name: Mike Holloway MRN: [id number] Date of Birth: 1940/12/10

## 2020-10-22 ENCOUNTER — Telehealth: Payer: Self-pay | Admitting: *Deleted

## 2020-10-22 DIAGNOSIS — R339 Retention of urine, unspecified: Secondary | ICD-10-CM

## 2020-10-22 DIAGNOSIS — R948 Abnormal results of function studies of other organs and systems: Secondary | ICD-10-CM

## 2020-10-22 NOTE — Telephone Encounter (Signed)
Home health nurse calling stating that she wants to put a piece of thin DuoDerm over the "sore" pt has on the side of his penis? Per pt he feels like he rubbing it raw with catheter. Pt would also like to switch the stat lock back to the left leg so that he can wipe himself after bowel movements. Pt and wife would really like to have the SPT put back in.  Please advise if DuoDerm is ok and switching stat lock?

## 2020-10-23 NOTE — Telephone Encounter (Signed)
DuoDerm and switching StatLock are fine. Please make sure to position StatLock to minimize Foley contact with his penis to reduce pressure on the tissue there. Have they reached back out to [facility] regarding SPT placement?

## 2020-10-23 NOTE — Telephone Encounter (Signed)
Notified patients home health nurse as advised, she verbalized understanding.

## 2020-10-28 ENCOUNTER — Encounter: Payer: Self-pay | Admitting: Physical Therapy

## 2020-10-28 ENCOUNTER — Other Ambulatory Visit: Payer: Self-pay

## 2020-10-28 ENCOUNTER — Ambulatory Visit: Payer: Medicare Other | Attending: Orthopedic Surgery

## 2020-10-28 DIAGNOSIS — R269 Unspecified abnormalities of gait and mobility: Secondary | ICD-10-CM

## 2020-10-28 DIAGNOSIS — R2689 Other abnormalities of gait and mobility: Secondary | ICD-10-CM | POA: Diagnosis present

## 2020-10-28 DIAGNOSIS — M6281 Muscle weakness (generalized): Secondary | ICD-10-CM | POA: Diagnosis not present

## 2020-10-28 DIAGNOSIS — R293 Abnormal posture: Secondary | ICD-10-CM | POA: Insufficient documentation

## 2020-10-28 NOTE — Telephone Encounter (Signed)
Patient wife calling asking for a call back from Sam, per wife there is a lot of information about pt's previous SPT from 2 yrs ago, that may be incorrect information. Pt wife would also like to update Sam on where they are with [facility] on the SPT placement.  (wife would not tell me anymore information)

## 2020-10-28 NOTE — Therapy (Addendum)
[facility] [address] [place], [place], [address] Phone: [phone]   Fax:  [phone]  Physical Therapy Treatment  Patient Details  Name: Mike Holloway MRN: [id number] Date of Birth: [birth year] Referring Provider (PT): Dr. Rudene Christians   Encounter Date: 10/28/2020    Past Medical History:  Diagnosis Date  . Arthritis   . Atrial flutter ([place])   . Diabetes mellitus ([place])   . Essential tremor   . Essential tremor    deep brain stimulator   . Hypercholesteremia   . Hypertension   . Incontinence   . 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[place])    grew on the testical  . SIADH (syndrome of inappropriate ADH production) ([place])   . Sleep apnea   . Stroke [facility])     Past Surgical History:  Procedure Laterality Date  . ABLATION    . APPENDECTOMY    . CATARACT EXTRACTION, BILATERAL    . COLONOSCOPY WITH PROPOFOL N/A 03/17/2018   Procedure: COLONOSCOPY WITH PROPOFOL;  Surgeon: Jonathon Bellows, MD;  Location: [facility] ENDOSCOPY;  Service: Gastroenterology;  Laterality: N/A;  . DEEP BRAIN STIMULATOR PLACEMENT    . 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 HEMORRHOID SURGERY    . HERNIA REPAIR    . HIP FRACTURE SURGERY    . INTRAMEDULLARY (IM) NAIL INTERTROCHANTERIC Right 09/16/2018   Procedure: INTRAMEDULLARY (IM) NAIL INTERTROCHANTRIC;  Surgeon: Hessie Knows, MD;  Location: [facility] ORS;  Service: Orthopedics;  Laterality: Right;  . IR CATHETER TUBE CHANGE  11/22/2018  . NASAL SINUS SURGERY    . ORCHIECTOMY    . TONSILLECTOMY      There were no vitals filed for this visit.    There.ex.:  Nustep L5 10 min seat #12. B UE with MH to low back.(at end of appointment for endurance, not billed)   // bars: gait belt and CGA  4# ankle wts.: high knees 3 laps, side stepping 4 laps, standing hip abd and flexion with R LE only (pain with  standing on R side to kick L LE)  Side stepping 3 laps,    Seated 4# ankle wts.: marching/ LAQ x1 min each   Sit to stands in front of //-bars 5x.   Neuro:   Standing fishing task 2x with rest break required before 5 minutes. No UE assist on //bars. NBoS balance, modified tandem balance 30 sec each  Seated rest breaks taken between all sets         PT Long Term Goals - 09/03/20 1422      PT LONG TERM GOAL #1   Title Pt. independent with HEP to increase B hip flexion/ R quad muscle strength 1/2 muscle grade to improve pain-free mobility.    Baseline R knee extension limited to 4/5 MMT secondary to pain/ fear of pain. 2/9: Deferred.  4/13: 4+/5 MMT (max potential).    Time 4    Period Weeks    Status Achieved      PT LONG TERM GOAL #2   Title Pt. will increase Berg balance test to >40 out of 56 to improve independence with gait/ decrease fall risk.    Baseline Berg: 22/56 (significant fall risk); 9/30 32/56.  11/10: 32 (limited by R knee pain)  2/9: 33/56 limited due to R knee and Low Back pain; 8/10: 29/56; 10/5: 38/56    Time 8  Period Weeks    Status Partially Met    Target Date 10/28/20      PT LONG TERM GOAL #3   Title Pt. able to ambulate 100 feet with consistent 2 point gait pattern and use of least restrictive assistive devce to improve household mobility.    Baseline Pt. able to ambulate with use of RW/ CGA to min. A for safety.   Heavy use of B UE.; 9/30 pt able to ambulate with rollator with inconsistent 2 point gait pattern, heavy reliance on UE CGA/supervision  2/9: Pt. uses 2 point step pattern with walking with rollader with decreased stride length. Pt. would be limited due to pain in the knee walking for >100 ft.; 8/10: Pt demonstrates ability to ambulate 100' with consistent 2 point gait pattern with rollator.    Time 0    Period Weeks    Status Achieved      PT LONG TERM GOAL #4   Title Pt. able to stand from normal chair with no UE assist to  improve safety/independence with transfers.     Baseline Unable to stand from standard chair without heavy UE assist. 10/6, pt unable to stand from standard chair without UE support.  11/10: benefits from 1 UE assist    2/9: Pt. can rise from normal chair with 1 UE assist and must control descent with UE assist.; 8/10: Pt requires 2 UE for standing from standard chair height.; 10/5: Requires single UE to stand.    Time 8    Period Weeks    Status Partially Met    Target Date 10/28/20      PT LONG TERM GOAL #5   Title Pt. will perform a sit to stand with 1 UE support to be able to perform toileting activities with more independance.    Baseline Pt. requires 2 UE to push from to sit to stand from standard chair height. Pt. has grab rails available but can not get up by pulling.; 8/10: pt requires 2 UE's to STS from standard chair height.; 10/5: Only required single UE support to stand. Use of momentum but able to perform independently.    Time 8    Period Weeks    Status Achieved      PT LONG TERM GOAL #6   Title Pt. will ascend/ descend 4 steps with use of B handrails and step pattern with mod. independence safely to be able to enter son's house.    Baseline Step pattern with heavy UE assist and CGA/min. A for safety.; 8/10: Pt safely asc/desc 4 steps with B hand rails and step to pattern with mod indep to enter son's house safely.    Time 0    Period Weeks    Status Achieved      PT LONG TERM GOAL #7   Title Pt will be able to stand at bathroom sink for 5 min with no UE support to complete ADL's like brushing his teeth, washing his hands, and combing his hair.    Baseline 8/10: Ability to stand with no UE support for 2 min.; 10/5: 3.5 min due to LBP. Has demonstrated in previous PT sessions he can stand for 5 min.    Time 8    Period Weeks    Status Partially Met    Target Date 10/28/20      PT LONG TERM GOAL #8   Title Pt will demonstrate ability to stand from a lowered surface below  18" with single Ue  support to demonstrate improved BLE strength to improve transfers from toilet.    Baseline 10/5: Able to stand from 18" chair with single UE support with use of momentum.    Time 8    Period Weeks    Status Partially Met    Target Date 10/28/20                  Patient will benefit from skilled therapeutic intervention in order to improve the following deficits and impairments:     Visit Diagnosis: No diagnosis found.     Problem List Patient Active Problem List   Diagnosis Date Noted  . DVT femoral (deep venous thrombosis) with thrombophlebitis, left ([place]) 02/06/2020  . Lymphedema 04/15/2019  . Bilateral leg edema 01/25/2019  . Hyponatremia 12/24/2018  . SIADH (syndrome of inappropriate ADH production) ([place]) 12/24/2018  . Erosion of urethra due to catheterization of urinary tract ([place]) 10/27/2018  . Generalized weakness 10/14/2018  . Hip fracture ([place]) 09/15/2018  . 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 HLD (hyperlipidemia) 03/24/2018  . Contusion of right knee 02/14/2018  . Bradycardia 12/28/2017  . 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 Tremor 09/04/2016  . Chronic pain of right knee 08/10/2016  . Right ankle pain 08/10/2016  . 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  . 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[place]) 12/11/2015  . Urinary retention 09/08/2015  . Lymphoma, 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[place]) 04/03/2015  . Breathlessness on exertion 11/21/2014  . Breath shortness 11/21/2014  . Arthropathy 11/07/2014  . Atrial flutter, paroxysmal ([place]) 11/07/2014  . Type 2 diabetes mellitus ([place]) 11/07/2014  . Benign essential tremor 11/07/2014  . Benign essential HTN 11/07/2014  . Mixed incontinence 11/07/2014  . Hypercholesterolemia without hypertriglyceridemia 11/07/2014  . Apnea, sleep 11/07/2014  . Controlled type 2 diabetes mellitus without  complication ([place]) 33/43/5686  . Pure hypercholesterolemia 11/07/2014  . Other abnormalities of gait and mobility 11/02/2011  . Decreased mobility 11/02/2011  . Abnormal gait 06/29/2011  . Discoordination 06/29/2011    Kayleen Memos 10/28/2020, 1:07 PM  [facility] [address] [place], [place], [address] Phone: [phone]   Fax:  [phone]  Name: Mike Holloway MRN: [id number] Date of Birth: 1941-04-02

## 2020-10-29 ENCOUNTER — Ambulatory Visit: Payer: Medicare Other | Admitting: Physical Therapy

## 2020-11-03 MED ORDER — KETOCONAZOLE 2 % EX CREA: 1.0000 "application " | TOPICAL_CREAM | Freq: Two times a day (BID) | CUTANEOUS | 0 refills | Status: DC

## 2020-11-03 NOTE — Addendum Note (Signed)
Addended by: Mickle Plumb on: 11/03/2020 05:01 PM   Modules accepted: Orders

## 2020-11-03 NOTE — Telephone Encounter (Signed)
I just spoke with the patient's wife via telephone.  She reports that Mike Holloway at [facility] anticipates time in the [facility] around March to place Mike Holloway SP tube. They request reimaging prior to this to confirm that he continues to have bowel overlying his bladder which would necessitate the procedure at [facility]. I will confer with Mike Holloway and get back to them.  Ketoconazole 2% cream prescribed for empiric treatment of candidal intertrigo.

## 2020-11-04 ENCOUNTER — Other Ambulatory Visit: Payer: Self-pay

## 2020-11-04 ENCOUNTER — Ambulatory Visit: Payer: Medicare Other | Admitting: Physical Therapy

## 2020-11-04 DIAGNOSIS — R269 Unspecified abnormalities of gait and mobility: Secondary | ICD-10-CM

## 2020-11-04 DIAGNOSIS — M6281 Muscle weakness (generalized): Secondary | ICD-10-CM

## 2020-11-04 DIAGNOSIS — R2689 Other abnormalities of gait and mobility: Secondary | ICD-10-CM

## 2020-11-04 DIAGNOSIS — R293 Abnormal posture: Secondary | ICD-10-CM

## 2020-11-04 NOTE — Telephone Encounter (Signed)
Spoke w patients wife as advised, she verbalized understanding.

## 2020-11-04 NOTE — Telephone Encounter (Signed)
LMOM for patient to return call.

## 2020-11-04 NOTE — Telephone Encounter (Signed)
Please contact the patient and his wife and inform them that I spoke with Dr. Bernardo Heater and have placed an order for a CT scan of his pelvis to evaluate for his current bowel positioning.  The imaging department will contact him to schedule this.  If his overlying bowel has resolved, we can proceed with SP tube placement reattempt with IR at our facility.  Otherwise, recommend proceeding with plans at [facility].

## 2020-11-04 NOTE — Addendum Note (Signed)
Addended by: Mickle Plumb on: 11/04/2020 09:32 AM   Modules accepted: Orders

## 2020-11-05 ENCOUNTER — Encounter: Payer: Medicare Other | Admitting: Physical Therapy

## 2020-11-06 ENCOUNTER — Encounter: Payer: Self-pay | Admitting: Physical Therapy

## 2020-11-06 NOTE — Therapy (Signed)
[facility] [address] [place], [place], [address] Phone: [phone]   Fax:  [phone]  Physical Therapy Treatment  Patient Details  Name: Mike Holloway MRN: [id number] Date of Birth: 09-Sep-1941 Referring Provider (PT): Dr. Rudene Christians   Encounter Date: 11/04/2020   PT End of Session - 11/06/20 1045    Visit Number 96    Number of Visits 104    Date for PT Re-Evaluation 12/30/20    Authorization - Visit Number 8    Authorization - Number of Visits 10    PT Start Time 1300    PT Stop Time 1408    PT Time Calculation (min) 68 min    Equipment Utilized During Treatment Gait belt    Activity Tolerance Patient tolerated treatment well;Patient limited by pain    Behavior During Therapy [facility] for tasks assessed/performed            Past Medical History:  Diagnosis Date  . Arthritis   . Atrial flutter ([place])   . Diabetes mellitus ([place])   . Essential tremor   . Essential tremor    deep brain stimulator   . Hypercholesteremia   . Hypertension   . Incontinence   . 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[place])    grew on the testical  . SIADH (syndrome of inappropriate ADH production) ([place])   . Sleep apnea   . Stroke [facility])     Past Surgical History:  Procedure Laterality Date  . ABLATION    . APPENDECTOMY    . CATARACT EXTRACTION, BILATERAL    . COLONOSCOPY WITH PROPOFOL N/A 03/17/2018   Procedure: COLONOSCOPY WITH PROPOFOL;  Surgeon: Jonathon Bellows, MD;  Location: [facility] ENDOSCOPY;  Service: Gastroenterology;  Laterality: N/A;  . DEEP BRAIN STIMULATOR PLACEMENT    . 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 HEMORRHOID SURGERY    . HERNIA REPAIR    . HIP FRACTURE SURGERY    . INTRAMEDULLARY (IM) NAIL INTERTROCHANTERIC Right 09/16/2018   Procedure: INTRAMEDULLARY (IM) NAIL INTERTROCHANTRIC;  Surgeon: Hessie Knows, MD;   Location: [facility] ORS;  Service: Orthopedics;  Laterality: Right;  . IR CATHETER TUBE CHANGE  11/22/2018  . NASAL SINUS SURGERY    . ORCHIECTOMY    . TONSILLECTOMY      There were no vitals filed for this visit.   Subjective Assessment - 11/06/20 1013    Subjective Increase c/o R knee pain reported prior to tx. session.  Pt. able to complete standing ther.ex./ sit to stands but increase R knee pain today.  Several seated rest breaks required during prolonged standing ther.ex. due to R knee pain, not low back pain.  Pt. requires extra time/ focus to increase hip flexion/ step overs for high green hurdles in //-bars with B UE assist.  No LOB and pt. remains motivated/ hard working during tx. session.    Patient is accompained by: Family member    Pertinent History Pt. states R knee is bothering him but it is the typical discomfort he experiences    Limitations House hold activities;Walking;Standing;Lifting    How long can you stand comfortably? <10 min    Patient Stated Goals Improve LE strength/ gait and balance with daily tasks.  Improve gait/ safety/ progression to least assistive device.    Currently in Pain? Yes    Pain Score 2     Pain Location Knee  Pain Orientation Right    Pain Descriptors / Indicators Aching;Constant    Pain Type Chronic pain    Pain Onset More than a month ago    Pain Onset More than a month ago           There.ex.:  // bars:gait belt and CGA   Attempted 5# LE ex. But limited by R knee pain.    Supine LE stretches/ assessment of R knee  Supine marching/ SLS/ partial bridging 10x2  Sit to stands in front of //-bars 5x.  Nustep L5 10 minseat #12. B UE with MH to low back.(at end of appointment for endurance, not billed)    Neuro:   Walking in //-bars over green hurdles (all sizes)- 3x.   Standing wt. Shifting with no UE assist (R knee pain)  NBoS balance, modified tandem balance 30 sec each  Seated rest breaks taken between all  sets     PT Long Term Goals - 11/07/20 1814      PT LONG TERM GOAL #1   Title Pt. independent with HEP to increase B hip flexion/ R quad muscle strength 1/2 muscle grade to improve pain-free mobility.    Baseline R knee extension limited to 4/5 MMT secondary to pain/ fear of pain. 2/9: Deferred.  4/13: 4+/5 MMT (max potential).    Time 4    Period Weeks    Status Achieved      PT LONG TERM GOAL #2   Title Pt. will increase Berg balance test to >40 out of 56 to improve independence with gait/ decrease fall risk.    Baseline Berg: 22/56 (significant fall risk); 9/30 32/56.  11/10: 32 (limited by R knee pain)  2/9: 33/56 limited due to R knee and Low Back pain; 8/10: 29/56; 10/5: 38/56    Time 8    Period Weeks    Status Partially Met    Target Date 12/30/20      PT LONG TERM GOAL #3   Title Pt. able to ambulate 100 feet with consistent 2 point gait pattern and use of least restrictive assistive devce to improve household mobility.    Baseline Pt. able to ambulate with use of RW/ CGA to min. A for safety.   Heavy use of B UE.; 9/30 pt able to ambulate with rollator with inconsistent 2 point gait pattern, heavy reliance on UE CGA/supervision  2/9: Pt. uses 2 point step pattern with walking with rollader with decreased stride length. Pt. would be limited due to pain in the knee walking for >100 ft.; 8/10: Pt demonstrates ability to ambulate 100' with consistent 2 point gait pattern with rollator.    Time 0    Period Weeks    Status Achieved      PT LONG TERM GOAL #4   Title Pt. able to stand from normal chair with no UE assist to improve safety/independence with transfers.     Baseline Unable to stand from standard chair without heavy UE assist. 10/6, pt unable to stand from standard chair without UE support.  11/10: benefits from 1 UE assist    2/9: Pt. can rise from normal chair with 1 UE assist and must control descent with UE assist.; 8/10: Pt requires 2 UE for standing from standard  chair height.; 10/5: Requires single UE to stand.    Time 8    Period Weeks    Status Partially Met    Target Date 12/30/20      PT LONG  TERM GOAL #5   Title Pt. will perform a sit to stand with 1 UE support to be able to perform toileting activities with more independance.    Baseline Pt. requires 2 UE to push from to sit to stand from standard chair height. Pt. has grab rails available but can not get up by pulling.; 8/10: pt requires 2 UE's to STS from standard chair height.; 10/5: Only required single UE support to stand. Use of momentum but able to perform independently.    Time 8    Period Weeks    Status Achieved      PT LONG TERM GOAL #6   Title Pt. will ascend/ descend 4 steps with use of B handrails and step pattern with mod. independence safely to be able to enter son's house.    Baseline Step pattern with heavy UE assist and CGA/min. A for safety.; 8/10: Pt safely asc/desc 4 steps with B hand rails and step to pattern with mod indep to enter son's house safely.    Time 0    Period Weeks    Status Achieved      PT LONG TERM GOAL #7   Title Pt will be able to stand at bathroom sink for 5 min with no UE support to complete ADL's like brushing his teeth, washing his hands, and combing his hair.    Baseline 8/10: Ability to stand with no UE support for 2 min.; 10/5: 3.5 min due to LBP. Has demonstrated in previous PT sessions he can stand for 5 min.    Time 8    Period Weeks    Status Partially Met    Target Date 12/30/20      PT LONG TERM GOAL #8   Title Pt will demonstrate ability to stand from a lowered surface below 18" with single Ue support to demonstrate improved BLE strength to improve transfers from toilet.    Baseline 10/5: Able to stand from 18" chair with single UE support with use of momentum.    Time 8    Period Weeks    Status Partially Met    Target Date 12/30/20                 Plan - 11/06/20 1047    Clinical Impression Statement Pt. able to  complete standing ther.ex./ sit to stands but increase R knee pain today. Several seated rest breaks required during prolonged standing ther.ex. due to R knee pain, not low back pain. Pt. requires extra time/ focus to increase hip flexion/ step overs for high green hurdles in //-bars with B UE assist. No LOB and pt. remains motivated/ hard working during tx. session.  Pt. will continue to benefit from skilled PT services to increase generalized strengthening to improve functional mobility/ walking.    Personal Factors and Comorbidities Age    Examination-Activity Limitations Toileting;Stand;Squat;Lift;Stairs    Stability/Clinical Decision Making Evolving/Moderate complexity    Clinical Decision Making Moderate    Rehab Potential Fair    PT Frequency 1x / week    PT Duration 8 weeks    PT Treatment/Interventions Moist Heat;Functional mobility training;Therapeutic activities;Therapeutic exercise;Balance training;Patient/family education;Manual techniques;Passive range of motion;Neuromuscular re-education;Stair training;Gait training;ADLs/Self Care Home Management    PT Next Visit Plan Continue to increase standing balance and stability to decrease risk of falls    PT Home Exercise Plan [email or web address]    Consulted and Agree with Plan of Care Patient;Family member/caregiver    Family Member Consulted spouse: [name]  Patient will benefit from skilled therapeutic intervention in order to improve the following deficits and impairments:  Abnormal gait,Decreased endurance,Decreased activity tolerance,Pain,Decreased balance,Impaired flexibility,Decreased strength,Postural dysfunction  Visit Diagnosis: Muscle weakness (generalized)  Gait difficulty  Abnormal posture  Balance problem     Problem List Patient Active Problem List   Diagnosis Date Noted  . DVT femoral (deep venous thrombosis) with thrombophlebitis, left ([place]) 02/06/2020  . Lymphedema 04/15/2019  . Bilateral leg edema 01/25/2019   . Hyponatremia 12/24/2018  . SIADH (syndrome of inappropriate ADH production) ([place]) 12/24/2018  . Erosion of urethra due to catheterization of urinary tract ([place]) 10/27/2018  . Generalized weakness 10/14/2018  . Hip fracture ([place]) 09/15/2018  . 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 HLD (hyperlipidemia) 03/24/2018  . Contusion of right knee 02/14/2018  . Bradycardia 12/28/2017  . 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 Tremor 09/04/2016  . Chronic pain of right knee 08/10/2016  . Right ankle pain 08/10/2016  . 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  . 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[place]) 12/11/2015  . Urinary retention 09/08/2015  . Lymphoma, 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[place]) 04/03/2015  . Breathlessness on exertion 11/21/2014  . Breath shortness 11/21/2014  . Arthropathy 11/07/2014  . Atrial flutter, paroxysmal ([place]) 11/07/2014  . Type 2 diabetes mellitus ([place]) 11/07/2014  . Benign essential tremor 11/07/2014  . Benign essential HTN 11/07/2014  . Mixed incontinence 11/07/2014  . Hypercholesterolemia without hypertriglyceridemia 11/07/2014  . Apnea, sleep 11/07/2014  . Controlled type 2 diabetes mellitus without complication ([place]) 94/50/3888  . Pure hypercholesterolemia 11/07/2014  . Other abnormalities of gait and mobility 11/02/2011  . Decreased mobility 11/02/2011  . Abnormal gait 06/29/2011  . Discoordination 06/29/2011   Pura Spice, PT, DPT # [phone] 11/07/2020, 6:17 PM  [facility] [address] [place], [place], [address] Phone: [phone]   Fax:  [phone]  Name: Niki Cosman MRN: [id number] Date of Birth: 1941-05-16

## 2020-11-11 ENCOUNTER — Ambulatory Visit: Payer: Medicare Other | Admitting: Physical Therapy

## 2020-11-11 ENCOUNTER — Other Ambulatory Visit: Payer: Self-pay

## 2020-11-11 DIAGNOSIS — M6281 Muscle weakness (generalized): Secondary | ICD-10-CM

## 2020-11-11 DIAGNOSIS — R2689 Other abnormalities of gait and mobility: Secondary | ICD-10-CM

## 2020-11-11 DIAGNOSIS — R269 Unspecified abnormalities of gait and mobility: Secondary | ICD-10-CM

## 2020-11-11 DIAGNOSIS — R293 Abnormal posture: Secondary | ICD-10-CM

## 2020-11-12 ENCOUNTER — Encounter: Payer: Medicare Other | Admitting: Physical Therapy

## 2020-11-12 ENCOUNTER — Ambulatory Visit
Admission: RE | Admit: 2020-11-12 | Discharge: 2020-11-12 | Disposition: A | Discharge status: Home / Self Care | Payer: Medicare Other | Source: Ambulatory Visit | Attending: Physician Assistant | Admitting: Physician Assistant

## 2020-11-12 DIAGNOSIS — R339 Retention of urine, unspecified: Secondary | ICD-10-CM

## 2020-11-12 DIAGNOSIS — R948 Abnormal results of function studies of other organs and systems: Secondary | ICD-10-CM | POA: Insufficient documentation

## 2020-11-14 NOTE — Therapy (Signed)
[facility] [address] [place], [place], [address] Phone: [phone]   Fax:  [phone]  Physical Therapy Treatment  Patient Details  Name: Mike Holloway MRN: [id number] Date of Birth: 10-06-1940 Referring Provider (PT): Dr. Rudene Christians   Encounter Date: 11/11/2020   PT End of Session - 11/13/20 0923    Visit Number 60    Number of Visits 104    Date for PT Re-Evaluation 12/30/20    Authorization - Visit Number 9    Authorization - Number of Visits 10    PT Start Time 6144    PT Stop Time 1404    PT Time Calculation (min) 65 min    Equipment Utilized During Treatment Gait belt    Activity Tolerance Patient tolerated treatment well;Patient limited by pain    Behavior During Therapy [facility] for tasks assessed/performed           Past Medical History:  Diagnosis Date  . Arthritis   . Atrial flutter ([place])   . Diabetes mellitus ([place])   . Essential tremor   . Essential tremor    deep brain stimulator   . Hypercholesteremia   . Hypertension   . Incontinence   . 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[place])    grew on the testical  . SIADH (syndrome of inappropriate ADH production) ([place])   . Sleep apnea   . Stroke [facility])     Past Surgical History:  Procedure Laterality Date  . ABLATION    . APPENDECTOMY    . CATARACT EXTRACTION, BILATERAL    . COLONOSCOPY WITH PROPOFOL N/A 03/17/2018   Procedure: COLONOSCOPY WITH PROPOFOL;  Surgeon: Jonathon Bellows, MD;  Location: [facility] ENDOSCOPY;  Service: Gastroenterology;  Laterality: N/A;  . DEEP BRAIN STIMULATOR PLACEMENT    . 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 HEMORRHOID SURGERY    . HERNIA REPAIR    . HIP FRACTURE SURGERY    . INTRAMEDULLARY (IM) NAIL INTERTROCHANTERIC Right 09/16/2018   Procedure: INTRAMEDULLARY (IM) NAIL INTERTROCHANTRIC;  Surgeon: Hessie Knows, MD;   Location: [facility] ORS;  Service: Orthopedics;  Laterality: Right;  . IR CATHETER TUBE CHANGE  11/22/2018  . NASAL SINUS SURGERY    . ORCHIECTOMY    . TONSILLECTOMY      There were no vitals filed for this visit.   Subjective Assessment - 11/13/20 0915    Subjective Pt. continues to report an increase in R knee pain/ discomfort with standing and walking tasks.  Pt. discussed returning to Dr. Rudene Christians office to discuss options.    Patient is accompained by: Family member    Pertinent History Pt. states R knee is bothering him but it is the typical discomfort he experiences    Limitations House hold activities;Walking;Standing;Lifting    How long can you stand comfortably? <10 min    Patient Stated Goals Improve LE strength/ gait and balance with daily tasks.  Improve gait/ safety/ progression to least assistive device.    Currently in Pain? Yes   no subjective pain score given   Pain Onset More than a month ago    Pain Onset More than a month ago            There.ex.:  // bars:gait belt and CGA(focus on consistent hip flexion/ posture correction/ heel strike).  No LE resisted ex. Due to knee pain.     Sit  to stands in front of //-bars 5x.  Step ups/ downs 3" plinth (no 6" step ups due to knee pain)- in //-bars.    Nustep L5 10 minseat #12. B UE with MH to low back.(at end of appointment for endurance, not billed)   Neuro:   Walking in //-bars over green hurdles (all sizes)- 3x.   Standing wt. Shifting with no UE assist (R knee pain)  NBoS balance, modified tandem balance 30 sec each  Walking in clinic and outside to car (maneuvering ramp/ side walk/ thresholds).    Seated rest breaks taken between all sets      PT Long Term Goals - 11/07/20 1814      PT LONG TERM GOAL #1   Title Pt. independent with HEP to increase B hip flexion/ R quad muscle strength 1/2 muscle grade to improve pain-free mobility.    Baseline R knee extension limited to 4/5 MMT  secondary to pain/ fear of pain. 2/9: Deferred.  4/13: 4+/5 MMT (max potential).    Time 4    Period Weeks    Status Achieved      PT LONG TERM GOAL #2   Title Pt. will increase Berg balance test to >40 out of 56 to improve independence with gait/ decrease fall risk.    Baseline Berg: 22/56 (significant fall risk); 9/30 32/56.  11/10: 32 (limited by R knee pain)  2/9: 33/56 limited due to R knee and Low Back pain; 8/10: 29/56; 10/5: 38/56    Time 8    Period Weeks    Status Partially Met    Target Date 12/30/20      PT LONG TERM GOAL #3   Title Pt. able to ambulate 100 feet with consistent 2 point gait pattern and use of least restrictive assistive devce to improve household mobility.    Baseline Pt. able to ambulate with use of RW/ CGA to min. A for safety.   Heavy use of B UE.; 9/30 pt able to ambulate with rollator with inconsistent 2 point gait pattern, heavy reliance on UE CGA/supervision  2/9: Pt. uses 2 point step pattern with walking with rollader with decreased stride length. Pt. would be limited due to pain in the knee walking for >100 ft.; 8/10: Pt demonstrates ability to ambulate 100' with consistent 2 point gait pattern with rollator.    Time 0    Period Weeks    Status Achieved      PT LONG TERM GOAL #4   Title Pt. able to stand from normal chair with no UE assist to improve safety/independence with transfers.     Baseline Unable to stand from standard chair without heavy UE assist. 10/6, pt unable to stand from standard chair without UE support.  11/10: benefits from 1 UE assist    2/9: Pt. can rise from normal chair with 1 UE assist and must control descent with UE assist.; 8/10: Pt requires 2 UE for standing from standard chair height.; 10/5: Requires single UE to stand.    Time 8    Period Weeks    Status Partially Met    Target Date 12/30/20      PT LONG TERM GOAL #5   Title Pt. will perform a sit to stand with 1 UE support to be able to perform toileting activities  with more independance.    Baseline Pt. requires 2 UE to push from to sit to stand from standard chair height. Pt. has grab rails available but can  not get up by pulling.; 8/10: pt requires 2 UE's to STS from standard chair height.; 10/5: Only required single UE support to stand. Use of momentum but able to perform independently.    Time 8    Period Weeks    Status Achieved      PT LONG TERM GOAL #6   Title Pt. will ascend/ descend 4 steps with use of B handrails and step pattern with mod. independence safely to be able to enter son's house.    Baseline Step pattern with heavy UE assist and CGA/min. A for safety.; 8/10: Pt safely asc/desc 4 steps with B hand rails and step to pattern with mod indep to enter son's house safely.    Time 0    Period Weeks    Status Achieved      PT LONG TERM GOAL #7   Title Pt will be able to stand at bathroom sink for 5 min with no UE support to complete ADL's like brushing his teeth, washing his hands, and combing his hair.    Baseline 8/10: Ability to stand with no UE support for 2 min.; 10/5: 3.5 min due to LBP. Has demonstrated in previous PT sessions he can stand for 5 min.    Time 8    Period Weeks    Status Partially Met    Target Date 12/30/20      PT LONG TERM GOAL #8   Title Pt will demonstrate ability to stand from a lowered surface below 18" with single Ue support to demonstrate improved BLE strength to improve transfers from toilet.    Baseline 10/5: Able to stand from 18" chair with single UE support with use of momentum.    Time 8    Period Weeks    Status Partially Met    Target Date 12/30/20              Plan - 11/13/20 1411    Clinical Impression Statement Pt. requires B UE support and gait belt with all standing ex. d/t deficits in balance.   Persistent knee pain remains a limiting factor with standing tolerance/ walking.  Pt. planning on scheduling a f/u appt. with Dr. Rudene Christians to discuss options for knee pain.  Pt demos an  approximately 3 minute endurance timeline before a rest break is needed.  Deficits in balance and endurance result in decreased prolonged WB tolerance and increased risk of falling.  No change to HEP at this time.    Personal Factors and Comorbidities Age    Examination-Activity Limitations Toileting;Stand;Squat;Lift;Stairs    Stability/Clinical Decision Making Evolving/Moderate complexity    Clinical Decision Making Moderate    Rehab Potential Fair    PT Frequency 1x / week    PT Duration 8 weeks    PT Treatment/Interventions Moist Heat;Functional mobility training;Therapeutic activities;Therapeutic exercise;Balance training;Patient/family education;Manual techniques;Passive range of motion;Neuromuscular re-education;Stair training;Gait training;ADLs/Self Care Home Management    PT Next Visit Plan Continue to increase standing balance and stability to decrease risk of falls    PT Home Exercise Plan [email or web address]    Consulted and Agree with Plan of Care Patient;Family member/caregiver    Family Member Consulted spouse: [name]           Patient will benefit from skilled therapeutic intervention in order to improve the following deficits and impairments:  Abnormal gait,Decreased endurance,Decreased activity tolerance,Pain,Decreased balance,Impaired flexibility,Decreased strength,Postural dysfunction  Visit Diagnosis: Muscle weakness (generalized)  Gait difficulty  Abnormal posture  Balance problem  Problem List Patient Active Problem List   Diagnosis Date Noted  . DVT femoral (deep venous thrombosis) with thrombophlebitis, left ([place]) 02/06/2020  . Lymphedema 04/15/2019  . Bilateral leg edema 01/25/2019  . Hyponatremia 12/24/2018  . SIADH (syndrome of inappropriate ADH production) ([place]) 12/24/2018  . Erosion of urethra due to catheterization of urinary tract ([place]) 10/27/2018  . Generalized weakness 10/14/2018  . Hip fracture ([place]) 09/15/2018  . Moderate mitral insufficiency  08/16/2018  . 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 HLD (hyperlipidemia) 03/24/2018  . Contusion of right knee 02/14/2018  . Bradycardia 12/28/2017  . 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 Tremor 09/04/2016  . Chronic pain of right knee 08/10/2016  . Right ankle pain 08/10/2016  . 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  . 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[place]) 12/11/2015  . Urinary retention 09/08/2015  . Lymphoma, 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[place]) 04/03/2015  . Breathlessness on exertion 11/21/2014  . Breath shortness 11/21/2014  . Arthropathy 11/07/2014  . Atrial flutter, paroxysmal ([place]) 11/07/2014  . Type 2 diabetes mellitus ([place]) 11/07/2014  . Benign essential tremor 11/07/2014  . Benign essential HTN 11/07/2014  . Mixed incontinence 11/07/2014  . Hypercholesterolemia without hypertriglyceridemia 11/07/2014  . Apnea, sleep 11/07/2014  . Controlled type 2 diabetes mellitus without complication ([place]) 37/95/5831  . Pure hypercholesterolemia 11/07/2014  . Other abnormalities of gait and mobility 11/02/2011  . Decreased mobility 11/02/2011  . Abnormal gait 06/29/2011  . Discoordination 06/29/2011   Pura Spice, PT, DPT # [phone] 11/14/2020, 8:26 AM  [place] [facility] [address] [place], [place], [address] Phone: [phone]   Fax:  [phone]  Name: Baldomero Mirarchi MRN: [id number] Date of Birth: Oct 05, 1940

## 2020-11-17 ENCOUNTER — Ambulatory Visit: Payer: Self-pay | Admitting: Physician Assistant

## 2020-11-17 ENCOUNTER — Telehealth: Payer: Self-pay

## 2020-11-17 NOTE — Telephone Encounter (Signed)
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  I recommend that he continue with plans to follow-up with [facility] for operative SP tube placement. ----- Message ----- From: Interface, Rad Results In Sent: 11/14/2020  11:00 AM EST To: Debroah Loop, PA-C

## 2020-11-17 NOTE — Telephone Encounter (Signed)
[facility] notifying patient as advised.

## 2020-11-18 ENCOUNTER — Ambulatory Visit: Payer: Medicare Other | Admitting: Physical Therapy

## 2020-11-18 ENCOUNTER — Other Ambulatory Visit: Payer: Self-pay

## 2020-11-18 DIAGNOSIS — R2689 Other abnormalities of gait and mobility: Secondary | ICD-10-CM

## 2020-11-18 DIAGNOSIS — R293 Abnormal posture: Secondary | ICD-10-CM

## 2020-11-18 DIAGNOSIS — M6281 Muscle weakness (generalized): Secondary | ICD-10-CM

## 2020-11-18 DIAGNOSIS — R269 Unspecified abnormalities of gait and mobility: Secondary | ICD-10-CM

## 2020-11-19 ENCOUNTER — Encounter: Payer: Medicare Other | Admitting: Physical Therapy

## 2020-11-19 ENCOUNTER — Encounter: Payer: Self-pay | Admitting: Physical Therapy

## 2020-11-19 ENCOUNTER — Ambulatory Visit (INDEPENDENT_AMBULATORY_CARE_PROVIDER_SITE_OTHER): Payer: Medicare Other | Admitting: Physician Assistant

## 2020-11-19 DIAGNOSIS — R339 Retention of urine, unspecified: Secondary | ICD-10-CM | POA: Diagnosis not present

## 2020-11-19 NOTE — Therapy (Signed)
[facility] [address] [place], [place], [address] Phone: [phone]   Fax:  [phone]  Physical Therapy Treatment Physical Therapy Progress Note   Dates of reporting period  09/02/21  to  11/18/20  Patient Details  Name: Mike Holloway MRN: [id number] Date of Birth: 07/11/1941 Referring Provider (PT): Dr. Rudene Christians   Encounter Date: 11/18/2020   PT End of Session - 11/19/20 0828    Visit Number 31    Number of Visits 104    Date for PT Re-Evaluation 12/30/20    Authorization - Visit Number 10    Authorization - Number of Visits 10    PT Start Time 1304    PT Stop Time 1402    PT Time Calculation (min) 58 min    Equipment Utilized During Treatment Gait belt    Activity Tolerance Patient tolerated treatment well;Patient limited by pain    Behavior During Therapy [facility] for tasks assessed/performed           Past Medical History:  Diagnosis Date  . Arthritis   . Atrial flutter ([place])   . Diabetes mellitus ([place])   . Essential tremor   . Essential tremor    deep brain stimulator   . Hypercholesteremia   . Hypertension   . Incontinence   . 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[place])    grew on the testical  . SIADH (syndrome of inappropriate ADH production) ([place])   . Sleep apnea   . Stroke [facility])     Past Surgical History:  Procedure Laterality Date  . ABLATION    . APPENDECTOMY    . CATARACT EXTRACTION, BILATERAL    . COLONOSCOPY WITH PROPOFOL N/A 03/17/2018   Procedure: COLONOSCOPY WITH PROPOFOL;  Surgeon: Jonathon Bellows, MD;  Location[facility] ENDOSCOPY;  Service: Gastroenterology;  Laterality: N/A;  . DEEP BRAIN STIMULATOR PLACEMENT    . 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 HEMORRHOID SURGERY    . HERNIA REPAIR    . HIP FRACTURE SURGERY    . INTRAMEDULLARY (IM) NAIL INTERTROCHANTERIC Right 09/16/2018    Procedure: INTRAMEDULLARY (IM) NAIL INTERTROCHANTRIC;  Surgeon: Hessie Knows, MD;  Location: [facility] ORS;  Service: Orthopedics;  Laterality: Right;  . IR CATHETER TUBE CHANGE  11/22/2018  . NASAL SINUS SURGERY    . ORCHIECTOMY    . TONSILLECTOMY      There were no vitals filed for this visit.   Subjective Assessment - 11/18/20 1308    Subjective Pt reports a 3/10 pain in the R knee today,    Patient is accompained by: Family member    Pertinent History Pt. states R knee is bothering him but it is the typical discomfort he experiences    Limitations House hold activities;Walking;Standing;Lifting    How long can you sit comfortably? 2 hours    How long can you stand comfortably? <10 min    How long can you walk comfortably? approx 5-10 min    Patient Stated Goals Improve LE strength/ gait and balance with daily tasks.  Improve gait/ safety/ progression to least assistive device.    Currently in Pain? Yes    Pain Score 3     Pain Location Knee    Pain Orientation Right    Pain Onset More than a month ago    Pain Onset More than a month ago  No LE resisted ex. Due to knee pain.    There.ex.:  // bars:gait belt and CGA(focus on consistent hip flexion/ posture correction/ heel strike).  Sit to stands in front of //-bars 5x.  Step ups/ downs 3" plinth (no 6" step ups due to knee pain)- in //-bars.    Nustep L5 10 minseat #12. B UE with MH to low back.(at end of appointment for endurance, not billed)   Neuro:   Walking in //-bars over green hurdles (all sizes)- 3x.   Standing wt. Shifting with no UE assist (R knee pain)  NBoS balance, modified tandem balance 30 sec each  Walking in clinic and outside to car (maneuvering ramp/ side walk/ thresholds).    Seated rest breaks taken between all sets     PT Long Term Goals - 11/18/20 1343      PT LONG TERM GOAL #1   Title Pt. independent with HEP to increase B hip flexion/ R quad muscle strength  1/2 muscle grade to improve pain-free mobility.    Baseline R knee extension limited to 4/5 MMT secondary to pain/ fear of pain. 2/9: Deferred.  4/13: 4+/5 MMT (max potential).    Time 4    Period Weeks    Status Achieved      PT LONG TERM GOAL #2   Title Pt. will increase Berg balance test to >40 out of 56 to improve independence with gait/ decrease fall risk.    Baseline Berg: 22/56 (significant fall risk); 9/30 32/56.  11/10: 32 (limited by R knee pain)  2/9: 33/56 limited due to R knee and Low Back pain; 8/10: 29/56; 10/5: 38/56, 11/18/20: 28    Time 8    Period Weeks    Status Partially Met    Target Date 12/30/20      PT LONG TERM GOAL #3   Title Pt. able to ambulate 100 feet with consistent 2 point gait pattern and use of least restrictive assistive devce to improve household mobility.    Baseline Pt. able to ambulate with use of RW/ CGA to min. A for safety.   Heavy use of B UE.; 9/30 pt able to ambulate with rollator with inconsistent 2 point gait pattern, heavy reliance on UE CGA/supervision  2/9: Pt. uses 2 point step pattern with walking with rollader with decreased stride length. Pt. would be limited due to pain in the knee walking for >100 ft.; 8/10: Pt demonstrates ability to ambulate 100' with consistent 2 point gait pattern with rollator. 11/18/20: Pt able to amb 130 ft with Rollator and 2-point gait pattern    Time 8    Period Weeks    Status Partially Met    Target Date 12/30/20      PT LONG TERM GOAL #4   Title Pt. able to stand from normal chair with no UE assist to improve safety/independence with transfers.     Baseline Unable to stand from standard chair without heavy UE assist. 10/6, pt unable to stand from standard chair without UE support.  11/10: benefits from 1 UE assist    2/9: Pt. can rise from normal chair with 1 UE assist and must control descent with UE assist.; 8/10: Pt requires 2 UE for standing from standard chair height.; 10/5: Requires single UE to stand.  11/18/20: Pt requires B UE support    Time 8    Period Weeks    Status On-going    Target Date 12/30/20      PT  LONG TERM GOAL #5   Title Pt. will perform a sit to stand with 1 UE support to be able to perform toileting activities with more independance.    Baseline Pt. requires 2 UE to push from to sit to stand from standard chair height. Pt. has grab rails available but can not get up by pulling.; 8/10: pt requires 2 UE's to STS from standard chair height.; 10/5: Only required single UE support to stand. Use of momentum but able to perform independently.    Time 8    Period Weeks    Status Achieved      PT LONG TERM GOAL #6   Title Pt. will ascend/ descend 4 steps with use of B handrails and step pattern with mod. independence safely to be able to enter son's house.    Baseline Step pattern with heavy UE assist and CGA/min. A for safety.; 8/10: Pt safely asc/desc 4 steps with B hand rails and step to pattern with mod indep to enter son's house safely.    Time 0    Period Weeks    Status Achieved      PT LONG TERM GOAL #7   Title Pt will be able to stand at bathroom sink for 5 min with no UE support to complete ADL's like brushing his teeth, washing his hands, and combing his hair.    Baseline 8/10: Ability to stand with no UE support for 2 min.; 10/5: 3.5 min due to LBP. Has demonstrated in previous PT sessions he can stand for 5 min.    Time 8    Period Weeks    Status Partially Met    Target Date 12/30/20      PT LONG TERM GOAL #8   Title Pt will demonstrate ability to stand from a lowered surface below 18" with single Ue support to demonstrate improved BLE strength to improve transfers from toilet.    Baseline 10/5: Able to stand from 18" chair with single UE support with use of momentum. 11/18/20: able to after 3 attempts    Time 8    Period Weeks    Status On-going    Target Date 12/30/20                 Plan - 11/19/20 1832    Clinical Impression Statement Pt.  works hard during PT tx. session.  Pt. requires occasional seated rest breaks during standing ther.ex./ gait/ balance tasks secondary to knee/back discomfort.  See updated PT goals.  Pt. able to stand unsupported and shift wt. without UE assist.  Pt. requires continued heavy UE assist with all aspects of walking/ step ups.  Pt. encouraged to walk more at home and outside with assist of son.    Personal Factors and Comorbidities Age    Examination-Activity Limitations Toileting;Stand;Squat;Lift;Stairs    Stability/Clinical Decision Making Evolving/Moderate complexity    Clinical Decision Making Moderate    Rehab Potential Fair    PT Frequency 1x / week    PT Duration 8 weeks    PT Treatment/Interventions Moist Heat;Functional mobility training;Therapeutic activities;Therapeutic exercise;Balance training;Patient/family education;Manual techniques;Passive range of motion;Neuromuscular re-education;Stair training;Gait training;ADLs/Self Care Home Management    PT Next Visit Plan Continue to increase standing balance and stability to decrease risk of falls    PT Home Exercise Plan [email or web address]    Consulted and Agree with Plan of Care Patient;Family member/caregiver    Family Member Consulted spouse: [name]  Patient will benefit from skilled therapeutic intervention in order to improve the following deficits and impairments:  Abnormal gait,Decreased endurance,Decreased activity tolerance,Pain,Decreased balance,Impaired flexibility,Decreased strength,Postural dysfunction  Visit Diagnosis: Muscle weakness (generalized)  Gait difficulty  Abnormal posture  Balance problem     Problem List Patient Active Problem List   Diagnosis Date Noted  . DVT femoral (deep venous thrombosis) with thrombophlebitis, left ([place]) 02/06/2020  . Lymphedema 04/15/2019  . Bilateral leg edema 01/25/2019  . Hyponatremia 12/24/2018  . SIADH (syndrome of inappropriate ADH production) ([place]) 12/24/2018  . Erosion  of urethra due to catheterization of urinary tract ([place]) 10/27/2018  . Generalized weakness 10/14/2018  . Hip fracture ([place]) 09/15/2018  . 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 HLD (hyperlipidemia) 03/24/2018  . Contusion of right knee 02/14/2018  . Bradycardia 12/28/2017  . 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 Tremor 09/04/2016  . Chronic pain of right knee 08/10/2016  . Right ankle pain 08/10/2016  . 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  . 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[place]) 12/11/2015  . Urinary retention 09/08/2015  . Lymphoma, 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[place]) 04/03/2015  . Breathlessness on exertion 11/21/2014  . Breath shortness 11/21/2014  . Arthropathy 11/07/2014  . Atrial flutter, paroxysmal ([place]) 11/07/2014  . Type 2 diabetes mellitus ([place]) 11/07/2014  . Benign essential tremor 11/07/2014  . Benign essential HTN 11/07/2014  . Mixed incontinence 11/07/2014  . Hypercholesterolemia without hypertriglyceridemia 11/07/2014  . Apnea, sleep 11/07/2014  . Controlled type 2 diabetes mellitus without complication ([place]) 81/85/6314  . Pure hypercholesterolemia 11/07/2014  . Other abnormalities of gait and mobility 11/02/2011  . Decreased mobility 11/02/2011  . Abnormal gait 06/29/2011  . Discoordination 06/29/2011   Pura Spice, PT, DPT # [phone] r2/23/2022, 6:47 PM  [place] [facility] [address] [place], [place], [address] Phone: [phone]   Fax:  [phone]  Name: Mike Holloway MRN: [id number] Date of Birth: 12-15-1940

## 2020-11-19 NOTE — Progress Notes (Signed)
Cath Change/ Replacement  Patient is present today for a catheter change due to urinary retention.  45ml of water was removed from the balloon, a 16FR coude foley cath was removed without difficulty and 2% lidocaine jelly was instilled into the urethra.  Patient was cleaned and prepped in a sterile fashion with betadine. A 16 FR coude foley cath was replaced into the bladder no complications were noted Urine return was noted 66ml and urine was yellow in color. The balloon was filled with 49ml of sterile water. A leg bag was attached for drainage.  A night bag was also given to the patient.  Performed by: Debroah Loop, PA-C   Follow up: Return in about 4 weeks (around 12/17/2020) for Catheter exchange.

## 2020-11-25 ENCOUNTER — Ambulatory Visit: Payer: Medicare Other | Admitting: Physical Therapy

## 2020-11-26 ENCOUNTER — Encounter: Payer: Medicare Other | Admitting: Physical Therapy

## 2020-12-03 ENCOUNTER — Other Ambulatory Visit: Payer: Self-pay

## 2020-12-03 ENCOUNTER — Ambulatory Visit: Payer: Medicare Other | Attending: Orthopedic Surgery | Admitting: Physical Therapy

## 2020-12-03 DIAGNOSIS — R269 Unspecified abnormalities of gait and mobility: Secondary | ICD-10-CM | POA: Insufficient documentation

## 2020-12-03 DIAGNOSIS — R293 Abnormal posture: Secondary | ICD-10-CM

## 2020-12-03 DIAGNOSIS — M6281 Muscle weakness (generalized): Secondary | ICD-10-CM

## 2020-12-03 DIAGNOSIS — R2689 Other abnormalities of gait and mobility: Secondary | ICD-10-CM

## 2020-12-05 NOTE — Therapy (Signed)
[facility] [address] [place], [place], [address] Phone: [phone]   Fax:  [phone]  Physical Therapy Treatment  Patient Details  Name: Mike Holloway MRN: [id number] Date of Birth: September 20, 1941 Referring Provider (PT): Dr. Rudene Christians   Encounter Date: 12/03/2020   PT End of Session - 12/05/20 1336    Visit Number 99    Number of Visits 104    Date for PT Re-Evaluation 12/30/20    Authorization - Visit Number 1    Authorization - Number of Visits 10    PT Start Time 1115    PT Stop Time 1209    PT Time Calculation (min) 54 min    Equipment Utilized During Treatment Gait belt    Activity Tolerance Patient tolerated treatment well;Patient limited by pain    Behavior During Therapy [facility] for tasks assessed/performed           Past Medical History:  Diagnosis Date  . Arthritis   . Atrial flutter ([place])   . Diabetes mellitus ([place])   . Essential tremor   . Essential tremor    deep brain stimulator   . Hypercholesteremia   . Hypertension   . Incontinence   . 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 SIADH (syndrome of inappropriate ADH production) ([place])   . Sleep apnea   . Stroke [facility])     Past Surgical History:  Procedure Laterality Date  . ABLATION    . APPENDECTOMY    . CATARACT EXTRACTION, BILATERAL    . COLONOSCOPY WITH PROPOFOL N/A 03/17/2018   Procedure: COLONOSCOPY WITH PROPOFOL;  Surgeon: Jonathon Bellows, MD;  Location: [facility] ENDOSCOPY;  Service: Gastroenterology;  Laterality: N/A;  . DEEP BRAIN STIMULATOR PLACEMENT    . 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 HEMORRHOID SURGERY    . HERNIA REPAIR    . HIP FRACTURE SURGERY    . INTRAMEDULLARY (IM) NAIL INTERTROCHANTERIC Right 09/16/2018   Procedure: INTRAMEDULLARY (IM) NAIL INTERTROCHANTRIC;  Surgeon: Hessie Knows, MD;   Location: [facility] ORS;  Service: Orthopedics;  Laterality: Right;  . IR CATHETER TUBE CHANGE  11/22/2018  . NASAL SINUS SURGERY    . ORCHIECTOMY    . TONSILLECTOMY      There were no vitals filed for this visit.   Subjective Assessment - 12/05/20 1334    Subjective Pt. states he is having a difficult day.  Pt. has appt. with Dr. Rudene Christians office to assess knee pain/ discuss POC.  Pt. is having issues with eyes this morning reported per wife.    Patient is accompained by: Family member    Pertinent History Pt. states R knee is bothering him but it is the typical discomfort he experiences    Limitations House hold activities;Walking;Standing;Lifting    How long can you sit comfortably? 2 hours    How long can you stand comfortably? <10 min    How long can you walk comfortably? approx 5-10 min    Patient Stated Goals Improve LE strength/ gait and balance with daily tasks.  Improve gait/ safety/ progression to least assistive device.    Currently in Pain? Yes   no subjective pain score given   Pain Onset More than a month ago    Pain Onset More than a month ago  There.ex.:  // bars:SBA/CGA(focus on consistent hip flexion/ posture correction/ heel strike).  Sit to stands in front of //-bars 5x.  Standing hip flexion/ abduction 10x2 each (cuing to decrease UE support).   Nustep L5 10 minseat #12. B UE with MH to low back.(at end of appointment for endurance, not billed)   Neuro:   Walking in //-bars: wt. Shifting with no UE assist (all planes)- mirror feedback.     Walking in //-bars:  Step over 3" plinths and added cone taps 4x.  Walking in clinic/ gym with cuing on BOS/ posture/ heel strike  Seated rest breaks taken between all sets  Discussed pain in R thumb/ opponens pollicis region.     PT Long Term Goals - 11/18/20 1343      PT LONG TERM GOAL #1   Title Pt. independent with HEP to increase B hip flexion/ R quad muscle strength 1/2 muscle  grade to improve pain-free mobility.    Baseline R knee extension limited to 4/5 MMT secondary to pain/ fear of pain. 2/9: Deferred.  4/13: 4+/5 MMT (max potential).    Time 4    Period Weeks    Status Achieved      PT LONG TERM GOAL #2   Title Pt. will increase Berg balance test to >40 out of 56 to improve independence with gait/ decrease fall risk.    Baseline Berg: 22/56 (significant fall risk); 9/30 32/56.  11/10: 32 (limited by R knee pain)  2/9: 33/56 limited due to R knee and Low Back pain; 8/10: 29/56; 10/5: 38/56, 11/18/20: 28    Time 8    Period Weeks    Status Partially Met    Target Date 12/30/20      PT LONG TERM GOAL #3   Title Pt. able to ambulate 100 feet with consistent 2 point gait pattern and use of least restrictive assistive devce to improve household mobility.    Baseline Pt. able to ambulate with use of RW/ CGA to min. A for safety.   Heavy use of B UE.; 9/30 pt able to ambulate with rollator with inconsistent 2 point gait pattern, heavy reliance on UE CGA/supervision  2/9: Pt. uses 2 point step pattern with walking with rollader with decreased stride length. Pt. would be limited due to pain in the knee walking for >100 ft.; 8/10: Pt demonstrates ability to ambulate 100' with consistent 2 point gait pattern with rollator. 11/18/20: Pt able to amb 130 ft with Rollator and 2-point gait pattern    Time 8    Period Weeks    Status Partially Met    Target Date 12/30/20      PT LONG TERM GOAL #4   Title Pt. able to stand from normal chair with no UE assist to improve safety/independence with transfers.     Baseline Unable to stand from standard chair without heavy UE assist. 10/6, pt unable to stand from standard chair without UE support.  11/10: benefits from 1 UE assist    2/9: Pt. can rise from normal chair with 1 UE assist and must control descent with UE assist.; 8/10: Pt requires 2 UE for standing from standard chair height.; 10/5: Requires single UE to stand. 11/18/20: Pt  requires B UE support    Time 8    Period Weeks    Status On-going    Target Date 12/30/20      PT LONG TERM GOAL #5   Title Pt. will perform a sit to  stand with 1 UE support to be able to perform toileting activities with more independance.    Baseline Pt. requires 2 UE to push from to sit to stand from standard chair height. Pt. has grab rails available but can not get up by pulling.; 8/10: pt requires 2 UE's to STS from standard chair height.; 10/5: Only required single UE support to stand. Use of momentum but able to perform independently.    Time 8    Period Weeks    Status Achieved      PT LONG TERM GOAL #6   Title Pt. will ascend/ descend 4 steps with use of B handrails and step pattern with mod. independence safely to be able to enter son's house.    Baseline Step pattern with heavy UE assist and CGA/min. A for safety.; 8/10: Pt safely asc/desc 4 steps with B hand rails and step to pattern with mod indep to enter son's house safely.    Time 0    Period Weeks    Status Achieved      PT LONG TERM GOAL #7   Title Pt will be able to stand at bathroom sink for 5 min with no UE support to complete ADL's like brushing his teeth, washing his hands, and combing his hair.    Baseline 8/10: Ability to stand with no UE support for 2 min.; 10/5: 3.5 min due to LBP. Has demonstrated in previous PT sessions he can stand for 5 min.    Time 8    Period Weeks    Status Partially Met    Target Date 12/30/20      PT LONG TERM GOAL #8   Title Pt will demonstrate ability to stand from a lowered surface below 18" with single Ue support to demonstrate improved BLE strength to improve transfers from toilet.    Baseline 10/5: Able to stand from 18" chair with single UE support with use of momentum. 11/18/20: able to after 3 attempts    Time 8    Period Weeks    Status On-going    Target Date 12/30/20                 Plan - 12/05/20 1336    Clinical Impression Statement Pt. ambulates with  more antaligic, slower gait pattern today secondary to knee pain.  Pt. states he cancelled last appt. after Podiatrist appt. and soreness/ pain in toes.  Pt. still worked hard with PT today and complete seated/ standing ther.ex. with no ankle wts.  No increase c/o pain in back/knees reported t/o tx. but initial R knee noted with STS and initial steps after standing.  No LOB and pt. more aware of importance of consistent hip flexion/ heel strike to decrease fall risk.    Personal Factors and Comorbidities Age    Examination-Activity Limitations Toileting;Stand;Squat;Lift;Stairs    Stability/Clinical Decision Making Evolving/Moderate complexity    Clinical Decision Making Moderate    Rehab Potential Fair    PT Frequency 1x / week    PT Duration 8 weeks    PT Treatment/Interventions Moist Heat;Functional mobility training;Therapeutic activities;Therapeutic exercise;Balance training;Patient/family education;Manual techniques;Passive range of motion;Neuromuscular re-education;Stair training;Gait training;ADLs/Self Care Home Management    PT Next Visit Plan Continue to increase standing balance and stability to decrease risk of falls    PT Home Exercise Plan [email or web address]    Consulted and Agree with Plan of Care Patient;Family member/caregiver    Family Member Consulted spouse: [name]  Patient will benefit from skilled therapeutic intervention in order to improve the following deficits and impairments:  Abnormal gait,Decreased endurance,Decreased activity tolerance,Pain,Decreased balance,Impaired flexibility,Decreased strength,Postural dysfunction  Visit Diagnosis: Muscle weakness (generalized)  Gait difficulty  Abnormal posture  Balance problem     Problem List Patient Active Problem List   Diagnosis Date Noted  . DVT femoral (deep venous thrombosis) with thrombophlebitis, left ([place]) 02/06/2020  . Lymphedema 04/15/2019  . Bilateral leg edema 01/25/2019  . Hyponatremia 12/24/2018  .  SIADH (syndrome of inappropriate ADH production) ([place]) 12/24/2018  . Erosion of urethra due to catheterization of urinary tract ([place]) 10/27/2018  . Generalized weakness 10/14/2018  . Hip fracture ([place]) 09/15/2018  . 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 HLD (hyperlipidemia) 03/24/2018  . Contusion of right knee 02/14/2018  . Bradycardia 12/28/2017  . 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 Tremor 09/04/2016  . Chronic pain of right knee 08/10/2016  . Right ankle pain 08/10/2016  . 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  . 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[place]) 12/11/2015  . Urinary retention 09/08/2015  . Lymphoma, 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[place]) 04/03/2015  . Breathlessness on exertion 11/21/2014  . Breath shortness 11/21/2014  . Arthropathy 11/07/2014  . Atrial flutter, paroxysmal ([place]) 11/07/2014  . Type 2 diabetes mellitus ([place]) 11/07/2014  . Benign essential tremor 11/07/2014  . Benign essential HTN 11/07/2014  . Mixed incontinence 11/07/2014  . Hypercholesterolemia without hypertriglyceridemia 11/07/2014  . Apnea, sleep 11/07/2014  . Controlled type 2 diabetes mellitus without complication ([place]) 46/95/0722  . Pure hypercholesterolemia 11/07/2014  . Other abnormalities of gait and mobility 11/02/2011  . Decreased mobility 11/02/2011  . Abnormal gait 06/29/2011  . Discoordination 06/29/2011   Pura Spice, PT, DPT # [phone] 12/05/2020, 1:40 PM  [place] [facility] [address] [place], [place], [address] Phone: [phone]   Fax:  [phone]  Name: Mike Holloway MRN: [id number] Date of Birth: 1941/09/06

## 2020-12-09 ENCOUNTER — Ambulatory Visit: Payer: Medicare Other | Admitting: Physical Therapy

## 2020-12-09 ENCOUNTER — Other Ambulatory Visit: Payer: Self-pay

## 2020-12-09 DIAGNOSIS — R293 Abnormal posture: Secondary | ICD-10-CM

## 2020-12-09 DIAGNOSIS — R2689 Other abnormalities of gait and mobility: Secondary | ICD-10-CM

## 2020-12-09 DIAGNOSIS — M6281 Muscle weakness (generalized): Secondary | ICD-10-CM

## 2020-12-09 DIAGNOSIS — R269 Unspecified abnormalities of gait and mobility: Secondary | ICD-10-CM

## 2020-12-12 ENCOUNTER — Encounter: Payer: Self-pay | Admitting: Physical Therapy

## 2020-12-12 NOTE — Therapy (Signed)
[facility] [address] [place], [place], [address] Phone: [phone]   Fax:  [phone]  Physical Therapy Treatment  Patient Details  Name: Mike Holloway MRN: [id number] Date of Birth: [birth year] Referring Provider (PT): Dr. Rudene Christians   Encounter Date: 12/09/2020   PT End of Session - 12/12/20 0752    Visit Number 100    Number of Visits 104    Date for PT Re-Evaluation 12/30/20    Authorization - Visit Number 2    Authorization - Number of Visits 10    PT Start Time 7353    PT Stop Time 1440    PT Time Calculation (min) 51 min    Equipment Utilized During Treatment Gait belt    Activity Tolerance Patient tolerated treatment well;Patient limited by pain    Behavior During Therapy [facility] for tasks assessed/performed           Past Medical History:  Diagnosis Date  . Arthritis   . Atrial flutter ([place])   . Diabetes mellitus ([place])   . Essential tremor   . Essential tremor    deep brain stimulator   . Hypercholesteremia   . Hypertension   . Incontinence   . 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[place])    grew on the testical  . SIADH (syndrome of inappropriate ADH production) ([place])   . Sleep apnea   . Stroke [facility])     Past Surgical History:  Procedure Laterality Date  . ABLATION    . APPENDECTOMY    . CATARACT EXTRACTION, BILATERAL    . COLONOSCOPY WITH PROPOFOL N/A 03/17/2018   Procedure: COLONOSCOPY WITH PROPOFOL;  Surgeon: Jonathon Bellows, MD;  Location: [facility] ENDOSCOPY;  Service: Gastroenterology;  Laterality: N/A;  . DEEP BRAIN STIMULATOR PLACEMENT    . 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 HEMORRHOID SURGERY    . HERNIA REPAIR    . HIP FRACTURE SURGERY    . INTRAMEDULLARY (IM) NAIL INTERTROCHANTERIC Right 09/16/2018   Procedure: INTRAMEDULLARY (IM) NAIL INTERTROCHANTRIC;  Surgeon: Hessie Knows, MD;   Location: [facility] ORS;  Service: Orthopedics;  Laterality: Right;  . IR CATHETER TUBE CHANGE  11/22/2018  . NASAL SINUS SURGERY    . ORCHIECTOMY    . TONSILLECTOMY      There were no vitals filed for this visit.   Subjective Assessment - 12/12/20 0751    Subjective Pt. has f/u visit with Dr. Rudene Christians this week to discuss POC for knee pain.    Patient is accompained by: Family member    Pertinent History Pt. states R knee is bothering him but it is the typical discomfort he experiences    Limitations House hold activities;Walking;Standing;Lifting    How long can you sit comfortably? 2 hours    How long can you stand comfortably? <10 min    How long can you walk comfortably? approx 5-10 min    Patient Stated Goals Improve LE strength/ gait and balance with daily tasks.  Improve gait/ safety/ progression to least assistive device.    Currently in Pain? Yes    Pain Score 3     Pain Location Knee    Pain Orientation Right    Pain Onset More than a month ago    Pain Onset More than a month ago            There.ex.:  // bars:focus on  consistent hip flexion/ BOS/ posture correction/ heel strike).  Sit to stands in front of //-bars 5x.  Seated GTB ex.: hip abduction/ marching/ heel raises 20x each.  Cuing to increase upright posture.   Nustep L5 10 minseat #12. B UE with MH to low back.(at end of appointment for endurance, not billed)   Neuro:   Walking in //-bars: wt. Shifting with no UE assist (all planes)- mirror feedback.     Standing balloon on wall (prolonged standing with L UE assist required)- CGA from PT.   Walking in clinic/ gym with cuing on BOS/ posture/ heel strike  Seated rest breaks taken between all sets      PT Long Term Goals - 11/18/20 1343      PT LONG TERM GOAL #1   Title Pt. independent with HEP to increase B hip flexion/ R quad muscle strength 1/2 muscle grade to improve pain-free mobility.    Baseline R knee extension limited to 4/5  MMT secondary to pain/ fear of pain. 2/9: Deferred.  4/13: 4+/5 MMT (max potential).    Time 4    Period Weeks    Status Achieved      PT LONG TERM GOAL #2   Title Pt. will increase Berg balance test to >40 out of 56 to improve independence with gait/ decrease fall risk.    Baseline Berg: 22/56 (significant fall risk); 9/30 32/56.  11/10: 32 (limited by R knee pain)  2/9: 33/56 limited due to R knee and Low Back pain; 8/10: 29/56; 10/5: 38/56, 11/18/20: 28    Time 8    Period Weeks    Status Partially Met    Target Date 12/30/20      PT LONG TERM GOAL #3   Title Pt. able to ambulate 100 feet with consistent 2 point gait pattern and use of least restrictive assistive devce to improve household mobility.    Baseline Pt. able to ambulate with use of RW/ CGA to min. A for safety.   Heavy use of B UE.; 9/30 pt able to ambulate with rollator with inconsistent 2 point gait pattern, heavy reliance on UE CGA/supervision  2/9: Pt. uses 2 point step pattern with walking with rollader with decreased stride length. Pt. would be limited due to pain in the knee walking for >100 ft.; 8/10: Pt demonstrates ability to ambulate 100' with consistent 2 point gait pattern with rollator. 11/18/20: Pt able to amb 130 ft with Rollator and 2-point gait pattern    Time 8    Period Weeks    Status Partially Met    Target Date 12/30/20      PT LONG TERM GOAL #4   Title Pt. able to stand from normal chair with no UE assist to improve safety/independence with transfers.     Baseline Unable to stand from standard chair without heavy UE assist. 10/6, pt unable to stand from standard chair without UE support.  11/10: benefits from 1 UE assist    2/9: Pt. can rise from normal chair with 1 UE assist and must control descent with UE assist.; 8/10: Pt requires 2 UE for standing from standard chair height.; 10/5: Requires single UE to stand. 11/18/20: Pt requires B UE support    Time 8    Period Weeks    Status On-going    Target  Date 12/30/20      PT LONG TERM GOAL #5   Title Pt. will perform a sit to stand with 1 UE support  to be able to perform toileting activities with more independance.    Baseline Pt. requires 2 UE to push from to sit to stand from standard chair height. Pt. has grab rails available but can not get up by pulling.; 8/10: pt requires 2 UE's to STS from standard chair height.; 10/5: Only required single UE support to stand. Use of momentum but able to perform independently.    Time 8    Period Weeks    Status Achieved      PT LONG TERM GOAL #6   Title Pt. will ascend/ descend 4 steps with use of B handrails and step pattern with mod. independence safely to be able to enter son's house.    Baseline Step pattern with heavy UE assist and CGA/min. A for safety.; 8/10: Pt safely asc/desc 4 steps with B hand rails and step to pattern with mod indep to enter son's house safely.    Time 0    Period Weeks    Status Achieved      PT LONG TERM GOAL #7   Title Pt will be able to stand at bathroom sink for 5 min with no UE support to complete ADL's like brushing his teeth, washing his hands, and combing his hair.    Baseline 8/10: Ability to stand with no UE support for 2 min.; 10/5: 3.5 min due to LBP. Has demonstrated in previous PT sessions he can stand for 5 min.    Time 8    Period Weeks    Status Partially Met    Target Date 12/30/20      PT LONG TERM GOAL #8   Title Pt will demonstrate ability to stand from a lowered surface below 18" with single Ue support to demonstrate improved BLE strength to improve transfers from toilet.    Baseline 10/5: Able to stand from 18" chair with single UE support with use of momentum. 11/18/20: able to after 3 attempts    Time 8    Period Weeks    Status On-going    Target Date 12/30/20                 Plan - 12/12/20 1975    Clinical Impression Statement Pt. continues to be limited by knee pain with prolonged standing/walking tasks, even with heavy UE  assist.  Pt. has f/u with ortho MD this week to discuss options/ pain mgmt.  Pt. did well with standing overhead tasks but requires L UE on //-bar to maintain balance/ decrease LE wt. bearing due to knee pain.  Pt. works hard during PT and requires extra time with outside walking to car and managemet into passenger seat.  Pt. continues to be able to stand/ sit on commode independently with UE assist.    Personal Factors and Comorbidities Age    Examination-Activity Limitations Toileting;Stand;Squat;Lift;Stairs    Stability/Clinical Decision Making Evolving/Moderate complexity    Clinical Decision Making Moderate    Rehab Potential Fair    PT Frequency 1x / week    PT Duration 8 weeks    PT Treatment/Interventions Moist Heat;Functional mobility training;Therapeutic activities;Therapeutic exercise;Balance training;Patient/family education;Manual techniques;Passive range of motion;Neuromuscular re-education;Stair training;Gait training;ADLs/Self Care Home Management    PT Next Visit Plan Continue to increase standing balance and stability to decrease risk of falls.   DISCUSS MD appt.    PT Home Exercise Plan [email or web address]    Consulted and Agree with Plan of Care Patient;Family member/caregiver    Family Member Consulted spouse: [name]  Patient will benefit from skilled therapeutic intervention in order to improve the following deficits and impairments:  Abnormal gait,Decreased endurance,Decreased activity tolerance,Pain,Decreased balance,Impaired flexibility,Decreased strength,Postural dysfunction  Visit Diagnosis: Muscle weakness (generalized)  Gait difficulty  Abnormal posture  Balance problem     Problem List Patient Active Problem List   Diagnosis Date Noted  . DVT femoral (deep venous thrombosis) with thrombophlebitis, left ([place]) 02/06/2020  . Lymphedema 04/15/2019  . Bilateral leg edema 01/25/2019  . Hyponatremia 12/24/2018  . SIADH (syndrome of inappropriate ADH  production) ([place]) 12/24/2018  . Erosion of urethra due to catheterization of urinary tract ([place]) 10/27/2018  . Generalized weakness 10/14/2018  . Hip fracture ([place]) 09/15/2018  . 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 HLD (hyperlipidemia) 03/24/2018  . Contusion of right knee 02/14/2018  . Bradycardia 12/28/2017  . 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 Tremor 09/04/2016  . Chronic pain of right knee 08/10/2016  . Right ankle pain 08/10/2016  . 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  . 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[place]) 12/11/2015  . Urinary retention 09/08/2015  . Lymphoma, 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[place]) 04/03/2015  . Breathlessness on exertion 11/21/2014  . Breath shortness 11/21/2014  . Arthropathy 11/07/2014  . Atrial flutter, paroxysmal ([place]) 11/07/2014  . Type 2 diabetes mellitus ([place]) 11/07/2014  . Benign essential tremor 11/07/2014  . Benign essential HTN 11/07/2014  . Mixed incontinence 11/07/2014  . Hypercholesterolemia without hypertriglyceridemia 11/07/2014  . Apnea, sleep 11/07/2014  . Controlled type 2 diabetes mellitus without complication ([place]) 47/42/5956  . Pure hypercholesterolemia 11/07/2014  . Other abnormalities of gait and mobility 11/02/2011  . Decreased mobility 11/02/2011  . Abnormal gait 06/29/2011  . Discoordination 06/29/2011   Pura Spice, PT, DPT # [id number] 12/12/2020, 10:49 AM  [facility] [address] [place], [place], [address] Phone: [phone]   Fax:  [phone]  Name: Mike Holloway MRN: [id number] Date of Birth: 1940/10/31

## 2020-12-16 ENCOUNTER — Encounter: Payer: Self-pay | Admitting: Physical Therapy

## 2020-12-16 ENCOUNTER — Other Ambulatory Visit: Payer: Self-pay

## 2020-12-16 ENCOUNTER — Ambulatory Visit: Payer: Medicare Other | Admitting: Physical Therapy

## 2020-12-16 DIAGNOSIS — R269 Unspecified abnormalities of gait and mobility: Secondary | ICD-10-CM

## 2020-12-16 DIAGNOSIS — M6281 Muscle weakness (generalized): Secondary | ICD-10-CM | POA: Diagnosis not present

## 2020-12-16 DIAGNOSIS — R293 Abnormal posture: Secondary | ICD-10-CM

## 2020-12-16 DIAGNOSIS — R2689 Other abnormalities of gait and mobility: Secondary | ICD-10-CM

## 2020-12-16 NOTE — Therapy (Signed)
[facility] [address] [place], [place], [address] Phone: [phone]   Fax:  [phone]  Physical Therapy Treatment  Patient Details  Name: Mike Holloway MRN: [id number] Date of Birth: 1941-09-14 Referring Provider (PT): Dr. Rudene Christians   Encounter Date: 12/16/2020   PT End of Session - 12/16/20 1457    Visit Number 101    Number of Visits 104    Date for PT Re-Evaluation 12/30/20    Authorization - Visit Number 3    Authorization - Number of Visits 10    PT Start Time 6629    PT Stop Time 1457    PT Time Calculation (min) 70 min    Equipment Utilized During Treatment Gait belt    Activity Tolerance Patient tolerated treatment well;Patient limited by pain    Behavior During Therapy [facility] for tasks assessed/performed           Past Medical History:  Diagnosis Date  . Arthritis   . Atrial flutter ([place])   . Diabetes mellitus ([place])   . Essential tremor   . Essential tremor    deep brain stimulator   . Hypercholesteremia   . Hypertension   . Incontinence   . 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[place])    grew on the testical  . SIADH (syndrome of inappropriate ADH production) ([place])   . Sleep apnea   . Stroke [facility])     Past Surgical History:  Procedure Laterality Date  . ABLATION    . APPENDECTOMY    . CATARACT EXTRACTION, BILATERAL    . COLONOSCOPY WITH PROPOFOL N/A 03/17/2018   Procedure: COLONOSCOPY WITH PROPOFOL;  Surgeon: Jonathon Bellows, MD;  Location: [facility] ENDOSCOPY;  Service: Gastroenterology;  Laterality: N/A;  . DEEP BRAIN STIMULATOR PLACEMENT    . 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 HEMORRHOID SURGERY    . HERNIA REPAIR    . HIP FRACTURE SURGERY    . INTRAMEDULLARY (IM) NAIL INTERTROCHANTERIC Right 09/16/2018   Procedure: INTRAMEDULLARY (IM) NAIL INTERTROCHANTRIC;  Surgeon: Hessie Knows, MD;   Location: [facility] ORS;  Service: Orthopedics;  Laterality: Right;  . IR CATHETER TUBE CHANGE  11/22/2018  . NASAL SINUS SURGERY    . ORCHIECTOMY    . TONSILLECTOMY      There were no vitals filed for this visit.   Subjective Assessment - 12/16/20 1438    Subjective Pt. started a Prednisone taper after MD f/u last week with decrease overal R knee pain.  Pt. states he has a 2/10 R medial knee pain at this time.    Patient is accompained by: Family member    Pertinent History Pt. states R knee is bothering him but it is the typical discomfort he experiences    Limitations House hold activities;Walking;Standing;Lifting    How long can you sit comfortably? 2 hours    How long can you stand comfortably? <10 min    How long can you walk comfortably? approx 5-10 min    Patient Stated Goals Improve LE strength/ gait and balance with daily tasks.  Improve gait/ safety/ progression to least assistive device.    Currently in Pain? Yes    Pain Score 2     Pain Location Knee    Pain Orientation Right    Pain Onset More than a month ago    Pain Onset More than a month ago  There.ex.:  Step ups/ downs on 6" step with B UE assist 8x.  Discussed Dr. Rudene Christians f/u    Seated LAQ/ marching/ heel and toe raises 20x each.     Nustep L5 10 minseat #12. B UE with MH to low back.(at end of appointment for endurance, not billed)   Neuro:   Walking in //-bars: wt. Shifting with no UE assist (all planes)- mirror feedback.   Fishing: 6 min. Standing tolerance (2x).  Step touches L/R with focus on lighter UE assist at handrails.    Tandem modified in //-bars UE assist required.  Difficulty.    Walking in clinic/ gym with cuing on BOS/ posture/ heel strike  Walking outside with rollator on varying terrain/ ramps/ uneven parking lot.  Instruction on BOS and increasing step length/ pattern.     Seated rest breaks taken between all sets      PT Long Term Goals - 11/18/20 1343       PT LONG TERM GOAL #1   Title Pt. independent with HEP to increase B hip flexion/ R quad muscle strength 1/2 muscle grade to improve pain-free mobility.    Baseline R knee extension limited to 4/5 MMT secondary to pain/ fear of pain. 2/9: Deferred.  4/13: 4+/5 MMT (max potential).    Time 4    Period Weeks    Status Achieved      PT LONG TERM GOAL #2   Title Pt. will increase Berg balance test to >40 out of 56 to improve independence with gait/ decrease fall risk.    Baseline Berg: 22/56 (significant fall risk); 9/30 32/56.  11/10: 32 (limited by R knee pain)  2/9: 33/56 limited due to R knee and Low Back pain; 8/10: 29/56; 10/5: 38/56, 11/18/20: 28    Time 8    Period Weeks    Status Partially Met    Target Date 12/30/20      PT LONG TERM GOAL #3   Title Pt. able to ambulate 100 feet with consistent 2 point gait pattern and use of least restrictive assistive devce to improve household mobility.    Baseline Pt. able to ambulate with use of RW/ CGA to min. A for safety.   Heavy use of B UE.; 9/30 pt able to ambulate with rollator with inconsistent 2 point gait pattern, heavy reliance on UE CGA/supervision  2/9: Pt. uses 2 point step pattern with walking with rollader with decreased stride length. Pt. would be limited due to pain in the knee walking for >100 ft.; 8/10: Pt demonstrates ability to ambulate 100' with consistent 2 point gait pattern with rollator. 11/18/20: Pt able to amb 130 ft with Rollator and 2-point gait pattern    Time 8    Period Weeks    Status Partially Met    Target Date 12/30/20      PT LONG TERM GOAL #4   Title Pt. able to stand from normal chair with no UE assist to improve safety/independence with transfers.     Baseline Unable to stand from standard chair without heavy UE assist. 10/6, pt unable to stand from standard chair without UE support.  11/10: benefits from 1 UE assist    2/9: Pt. can rise from normal chair with 1 UE assist and must control descent with  UE assist.; 8/10: Pt requires 2 UE for standing from standard chair height.; 10/5: Requires single UE to stand. 11/18/20: Pt requires B UE support    Time 8  Period Weeks    Status On-going    Target Date 12/30/20      PT LONG TERM GOAL #5   Title Pt. will perform a sit to stand with 1 UE support to be able to perform toileting activities with more independance.    Baseline Pt. requires 2 UE to push from to sit to stand from standard chair height. Pt. has grab rails available but can not get up by pulling.; 8/10: pt requires 2 UE's to STS from standard chair height.; 10/5: Only required single UE support to stand. Use of momentum but able to perform independently.    Time 8    Period Weeks    Status Achieved      PT LONG TERM GOAL #6   Title Pt. will ascend/ descend 4 steps with use of B handrails and step pattern with mod. independence safely to be able to enter son's house.    Baseline Step pattern with heavy UE assist and CGA/min. A for safety.; 8/10: Pt safely asc/desc 4 steps with B hand rails and step to pattern with mod indep to enter son's house safely.    Time 0    Period Weeks    Status Achieved      PT LONG TERM GOAL #7   Title Pt will be able to stand at bathroom sink for 5 min with no UE support to complete ADL's like brushing his teeth, washing his hands, and combing his hair.    Baseline 8/10: Ability to stand with no UE support for 2 min.; 10/5: 3.5 min due to LBP. Has demonstrated in previous PT sessions he can stand for 5 min.    Time 8    Period Weeks    Status Partially Met    Target Date 12/30/20      PT LONG TERM GOAL #8   Title Pt will demonstrate ability to stand from a lowered surface below 18" with single Ue support to demonstrate improved BLE strength to improve transfers from toilet.    Baseline 10/5: Able to stand from 18" chair with single UE support with use of momentum. 11/18/20: able to after 3 attempts    Time 8    Period Weeks    Status On-going     Target Date 12/30/20              Plan - 12/16/20 1458    Clinical Impression Statement Pt. able to stand for 6 minutes in front of gray chair without UE assist during fishing task.  Pt. reports slight increase in R knee pain during standing task and pain resolves with short seated rest break.  Pt. demonstrates ability to walk from gym to car with use of rollator and no rest breaks but extra time required due to difficulty walking on asphalt and uneven terrain.  Pt. instructed to go for outside walks with son to increase endurance/ safety.    Personal Factors and Comorbidities Age    Examination-Activity Limitations Toileting;Stand;Squat;Lift;Stairs    Stability/Clinical Decision Making Evolving/Moderate complexity    Clinical Decision Making Moderate    Rehab Potential Fair    PT Frequency 1x / week    PT Duration 8 weeks    PT Treatment/Interventions Moist Heat;Functional mobility training;Therapeutic activities;Therapeutic exercise;Balance training;Patient/family education;Manual techniques;Passive range of motion;Neuromuscular re-education;Stair training;Gait training;ADLs/Self Care Home Management    PT Next Visit Plan Continue to increase standing balance and stability to decrease risk of falls.    PT Home Exercise Plan [facility]  Consulted and Agree with Plan of Care Patient;Family member/caregiver    Family Member Consulted spouse: [name]           Patient will benefit from skilled therapeutic intervention in order to improve the following deficits and impairments:  Abnormal gait,Decreased endurance,Decreased activity tolerance,Pain,Decreased balance,Impaired flexibility,Decreased strength,Postural dysfunction  Visit Diagnosis: Muscle weakness (generalized)  Gait difficulty  Abnormal posture  Balance problem     Problem List Patient Active Problem List   Diagnosis Date Noted  . DVT femoral (deep venous thrombosis) with thrombophlebitis, left ([place]) 02/06/2020  .  Lymphedema 04/15/2019  . Bilateral leg edema 01/25/2019  . Hyponatremia 12/24/2018  . SIADH (syndrome of inappropriate ADH production) ([place]) 12/24/2018  . Erosion of urethra due to catheterization of urinary tract ([place]) 10/27/2018  . Generalized weakness 10/14/2018  . Hip fracture ([place]) 09/15/2018  . 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 HLD (hyperlipidemia) 03/24/2018  . Contusion of right knee 02/14/2018  . Bradycardia 12/28/2017  . 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 Tremor 09/04/2016  . Chronic pain of right knee 08/10/2016  . Right ankle pain 08/10/2016  . 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  . 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[place]) 12/11/2015  . Urinary retention 09/08/2015  . Lymphoma, 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[place]) 04/03/2015  . Breathlessness on exertion 11/21/2014  . Breath shortness 11/21/2014  . Arthropathy 11/07/2014  . Atrial flutter, paroxysmal ([place]) 11/07/2014  . Type 2 diabetes mellitus ([place]) 11/07/2014  . Benign essential tremor 11/07/2014  . Benign essential HTN 11/07/2014  . Mixed incontinence 11/07/2014  . Hypercholesterolemia without hypertriglyceridemia 11/07/2014  . Apnea, sleep 11/07/2014  . Controlled type 2 diabetes mellitus without complication ([place]) 96/22/2979  . Pure hypercholesterolemia 11/07/2014  . Other abnormalities of gait and mobility 11/02/2011  . Decreased mobility 11/02/2011  . Abnormal gait 06/29/2011  . Discoordination 06/29/2011   Pura Spice, PT, DPT # [id number] 12/16/2020, 3:07 PM  [facility] [address] [place], [place], [address] Phone: [phone]   Fax:  [phone]  Name: Mike Holloway MRN: [id number] Date of Birth: July 26, 1941

## 2020-12-17 ENCOUNTER — Ambulatory Visit (INDEPENDENT_AMBULATORY_CARE_PROVIDER_SITE_OTHER): Payer: Medicare Other | Admitting: Physician Assistant

## 2020-12-17 DIAGNOSIS — R339 Retention of urine, unspecified: Secondary | ICD-10-CM

## 2020-12-17 NOTE — Progress Notes (Signed)
Cath Change/ Replacement  Patient is present today for a catheter change due to urinary retention.  78ml of water was removed from the balloon, a 16FR coude foley cath was removed without difficulty.  Patient was cleaned and prepped in a sterile fashion with betadine and 2% lidocaine jelly was instilled into the urethra. A 16 FR coude foley cath was replaced into the bladder no complications were noted Urine return was noted 59ml and urine was yellow in color. The balloon was filled with 52ml of sterile water. Patient's leg bag was reattached for drainage.  A night bag was also given to the patient and patient was given instruction on how to change from one bag to another.    Performed by: Debroah Loop, PA-C   Follow up: Return in about 4 weeks (around 01/14/2021) for Catheter exchange.

## 2020-12-23 ENCOUNTER — Encounter: Payer: Self-pay | Admitting: Physical Therapy

## 2020-12-23 ENCOUNTER — Ambulatory Visit: Payer: Medicare Other | Admitting: Physical Therapy

## 2020-12-23 ENCOUNTER — Other Ambulatory Visit: Payer: Self-pay

## 2020-12-23 DIAGNOSIS — M6281 Muscle weakness (generalized): Secondary | ICD-10-CM

## 2020-12-23 DIAGNOSIS — R269 Unspecified abnormalities of gait and mobility: Secondary | ICD-10-CM

## 2020-12-23 DIAGNOSIS — R293 Abnormal posture: Secondary | ICD-10-CM

## 2020-12-23 DIAGNOSIS — R2689 Other abnormalities of gait and mobility: Secondary | ICD-10-CM

## 2020-12-23 NOTE — Therapy (Signed)
[facility] [address] [place], [place], [address] Phone: [phone]   Fax:  [phone]  Physical Therapy Treatment  Patient Details  Name: Mike Holloway MRN: [id number] Date of Birth: [birth year] Referring Provider (PT): Dr. Rudene Christians   Encounter Date: 12/23/2020   Treatment: 102 of 104.  Recert date: 02/01/997 3382 to 1455   Past Medical History:  Diagnosis Date  . Arthritis   . Atrial flutter ([place])   . Diabetes mellitus ([place])   . Essential tremor   . Essential tremor    deep brain stimulator   . Hypercholesteremia   . Hypertension   . Incontinence   . 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[place])    grew on the testical  . SIADH (syndrome of inappropriate ADH production) ([place])   . Sleep apnea   . Stroke [facility])     Past Surgical History:  Procedure Laterality Date  . ABLATION    . APPENDECTOMY    . CATARACT EXTRACTION, BILATERAL    . COLONOSCOPY WITH PROPOFOL N/A 03/17/2018   Procedure: COLONOSCOPY WITH PROPOFOL;  Surgeon: Jonathon Bellows, MD;  Location: [facility] ENDOSCOPY;  Service: Gastroenterology;  Laterality: N/A;  . DEEP BRAIN STIMULATOR PLACEMENT    . 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 HEMORRHOID SURGERY    . HERNIA REPAIR    . HIP FRACTURE SURGERY    . INTRAMEDULLARY (IM) NAIL INTERTROCHANTERIC Right 09/16/2018   Procedure: INTRAMEDULLARY (IM) NAIL INTERTROCHANTRIC;  Surgeon: Hessie Knows, MD;  Location: [facility] ORS;  Service: Orthopedics;  Laterality: Right;  . IR CATHETER TUBE CHANGE  11/22/2018  . NASAL SINUS SURGERY    . ORCHIECTOMY    . TONSILLECTOMY      There were no vitals filed for this visit.      Pt. states knee pain is constant with all standing/ walking tasks. Pt. states his knee felt better during first few days of taking Prednisone but no benefit at this  time.      There.ex.:  Standing hip flexion/ abduction/ extension 10x2 each (heavy UE assist)- R knee pain  Seated LAQ/ marching/ heel and toe raises 20x each.  No ankle wts. Today.   Nustep L5 10 minseat #12. B UE with MH to low back.(at end of appointment for endurance, not billed)   Neuro:   Walking in //-bars: wt. Shifting with no UE assist (all planes)- mirror feedback.    Sit to stands with focus on equal wt. Bearing/ proper technique.  Limited by R knee pain (modified).  Walking in clinic/ gym with cuing on BOS/ posture/ heel strike  Walking outside with rollator on to car/ managing thresholds.   Standing endurance in //-bars prior to Nustep.  R knee pain limited.    Seated rest breaks taken between all sets     PT Long Term Goals - 11/18/20 1343      PT LONG TERM GOAL #1   Title Pt. independent with HEP to increase B hip flexion/ R quad muscle strength 1/2 muscle grade to improve pain-free mobility.    Baseline R knee extension limited to 4/5 MMT secondary to pain/ fear of pain. 2/9: Deferred.  4/13: 4+/5 MMT (max potential).    Time 4    Period Weeks    Status Achieved      PT LONG TERM GOAL #2   Title Pt. will increase Berg balance  test to >40 out of 56 to improve independence with gait/ decrease fall risk.    Baseline Berg: 22/56 (significant fall risk); 9/30 32/56.  11/10: 32 (limited by R knee pain)  2/9: 33/56 limited due to R knee and Low Back pain; 8/10: 29/56; 10/5: 38/56, 11/18/20: 28    Time 8    Period Weeks    Status Partially Met    Target Date 12/30/20      PT LONG TERM GOAL #3   Title Pt. able to ambulate 100 feet with consistent 2 point gait pattern and use of least restrictive assistive devce to improve household mobility.    Baseline Pt. able to ambulate with use of RW/ CGA to min. A for safety.   Heavy use of B UE.; 9/30 pt able to ambulate with rollator with inconsistent 2 point gait pattern, heavy reliance on UE  CGA/supervision  2/9: Pt. uses 2 point step pattern with walking with rollader with decreased stride length. Pt. would be limited due to pain in the knee walking for >100 ft.; 8/10: Pt demonstrates ability to ambulate 100' with consistent 2 point gait pattern with rollator. 11/18/20: Pt able to amb 130 ft with Rollator and 2-point gait pattern    Time 8    Period Weeks    Status Partially Met    Target Date 12/30/20      PT LONG TERM GOAL #4   Title Pt. able to stand from normal chair with no UE assist to improve safety/independence with transfers.     Baseline Unable to stand from standard chair without heavy UE assist. 10/6, pt unable to stand from standard chair without UE support.  11/10: benefits from 1 UE assist    2/9: Pt. can rise from normal chair with 1 UE assist and must control descent with UE assist.; 8/10: Pt requires 2 UE for standing from standard chair height.; 10/5: Requires single UE to stand. 11/18/20: Pt requires B UE support    Time 8    Period Weeks    Status On-going    Target Date 12/30/20      PT LONG TERM GOAL #5   Title Pt. will perform a sit to stand with 1 UE support to be able to perform toileting activities with more independance.    Baseline Pt. requires 2 UE to push from to sit to stand from standard chair height. Pt. has grab rails available but can not get up by pulling.; 8/10: pt requires 2 UE's to STS from standard chair height.; 10/5: Only required single UE support to stand. Use of momentum but able to perform independently.    Time 8    Period Weeks    Status Achieved      PT LONG TERM GOAL #6   Title Pt. will ascend/ descend 4 steps with use of B handrails and step pattern with mod. independence safely to be able to enter son's house.    Baseline Step pattern with heavy UE assist and CGA/min. A for safety.; 8/10: Pt safely asc/desc 4 steps with B hand rails and step to pattern with mod indep to enter son's house safely.    Time 0    Period Weeks     Status Achieved      PT LONG TERM GOAL #7   Title Pt will be able to stand at bathroom sink for 5 min with no UE support to complete ADL's like brushing his teeth, washing his hands, and combing his  hair.    Baseline 8/10: Ability to stand with no UE support for 2 min.; 10/5: 3.5 min due to LBP. Has demonstrated in previous PT sessions he can stand for 5 min.    Time 8    Period Weeks    Status Partially Met    Target Date 12/30/20      PT LONG TERM GOAL #8   Title Pt will demonstrate ability to stand from a lowered surface below 18" with single Ue support to demonstrate improved BLE strength to improve transfers from toilet.    Baseline 10/5: Able to stand from 18" chair with single UE support with use of momentum. 11/18/20: able to after 3 attempts    Time 8    Period Weeks    Status On-going    Target Date 12/30/20            Persistent c/o R knee pain with transfers from chair and prolonged standing/ walking ex. Pt. requires UE assist in //-bars and with rollator to decrease R LE wt. bearing/ knee pain. Pt. works hard during standing ther.ex. with seated rest breaks due to increase R knee pain. Pt. may benefit from use of compression knee brace to alleviate pain during walking.   Patient will benefit from skilled therapeutic intervention in order to improve the following deficits and impairments:  Abnormal gait,Decreased endurance,Decreased activity tolerance,Pain,Decreased balance,Impaired flexibility,Decreased strength,Postural dysfunction  Visit Diagnosis: Muscle weakness (generalized)  Gait difficulty  Abnormal posture  Balance problem     Problem List Patient Active Problem List   Diagnosis Date Noted  . DVT femoral (deep venous thrombosis) with thrombophlebitis, left ([place]) 02/06/2020  . Lymphedema 04/15/2019  . Bilateral leg edema 01/25/2019  . Hyponatremia 12/24/2018  . SIADH (syndrome of inappropriate ADH production) ([place]) 12/24/2018  . Erosion of urethra due  to catheterization of urinary tract ([place]) 10/27/2018  . Generalized weakness 10/14/2018  . Hip fracture ([place]) 09/15/2018  . 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 HLD (hyperlipidemia) 03/24/2018  . Contusion of right knee 02/14/2018  . Bradycardia 12/28/2017  . 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 Tremor 09/04/2016  . Chronic pain of right knee 08/10/2016  . Right ankle pain 08/10/2016  . 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  . 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[place]) 12/11/2015  . Urinary retention 09/08/2015  . Lymphoma, 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[place]) 04/03/2015  . Breathlessness on exertion 11/21/2014  . Breath shortness 11/21/2014  . Arthropathy 11/07/2014  . Atrial flutter, paroxysmal ([place]) 11/07/2014  . Type 2 diabetes mellitus ([place]) 11/07/2014  . Benign essential tremor 11/07/2014  . Benign essential HTN 11/07/2014  . Mixed incontinence 11/07/2014  . Hypercholesterolemia without hypertriglyceridemia 11/07/2014  . Apnea, sleep 11/07/2014  . Controlled type 2 diabetes mellitus without complication ([place]) 21/19/4174  . Pure hypercholesterolemia 11/07/2014  . Other abnormalities of gait and mobility 11/02/2011  . Decreased mobility 11/02/2011  . Abnormal gait 06/29/2011  . Discoordination 06/29/2011   Pura Spice, PT, DPT # [phone] 12/26/2020, 3:50 AM  [place] [address] [place], [place], [address] Phone: [phone]   Fax:  [phone]  Name: Mike Holloway MRN: [id number] Date of Birth: 03-Feb-1941

## 2020-12-30 ENCOUNTER — Ambulatory Visit: Payer: Medicare Other | Attending: Orthopedic Surgery | Admitting: Physical Therapy

## 2020-12-30 ENCOUNTER — Other Ambulatory Visit: Payer: Self-pay

## 2020-12-30 ENCOUNTER — Encounter: Payer: Self-pay | Admitting: Physical Therapy

## 2020-12-30 DIAGNOSIS — M6281 Muscle weakness (generalized): Secondary | ICD-10-CM | POA: Diagnosis not present

## 2020-12-30 DIAGNOSIS — R293 Abnormal posture: Secondary | ICD-10-CM

## 2020-12-30 DIAGNOSIS — R269 Unspecified abnormalities of gait and mobility: Secondary | ICD-10-CM | POA: Diagnosis present

## 2020-12-30 DIAGNOSIS — R2689 Other abnormalities of gait and mobility: Secondary | ICD-10-CM | POA: Insufficient documentation

## 2020-12-30 NOTE — Therapy (Signed)
[facility] [address] [place], [place], [address] Phone: [phone]   Fax:  [phone]  Physical Therapy Treatment  Patient Details  Name: Mike Holloway MRN: [id number] Date of Birth: Aug 13, 1941 Referring Provider (PT): Dr. Rudene Christians   Encounter Date: 12/30/2020  Treatment: 103 of 111.  Recert date: 4/97/5300 1300 to 1403    Past Medical History:  Diagnosis Date  . Arthritis   . Atrial flutter ([place])   . Diabetes mellitus ([place])   . Essential tremor   . Essential tremor    deep brain stimulator   . Hypercholesteremia   . Hypertension   . Incontinence   . 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 SIADH (syndrome of inappropriate ADH production) ([place])   . Sleep apnea   . Stroke [facility])     Past Surgical History:  Procedure Laterality Date  . ABLATION    . APPENDECTOMY    . CATARACT EXTRACTION, BILATERAL    . COLONOSCOPY WITH PROPOFOL N/A 03/17/2018   Procedure: COLONOSCOPY WITH PROPOFOL;  Surgeon: Jonathon Bellows, MD;  Location: [facility] ENDOSCOPY;  Service: Gastroenterology;  Laterality: N/A;  . DEEP BRAIN STIMULATOR PLACEMENT    . 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 HEMORRHOID SURGERY    . HERNIA REPAIR    . HIP FRACTURE SURGERY    . INTRAMEDULLARY (IM) NAIL INTERTROCHANTERIC Right 09/16/2018   Procedure: INTRAMEDULLARY (IM) NAIL INTERTROCHANTRIC;  Surgeon: Hessie Knows, MD;  Location: [facility] ORS;  Service: Orthopedics;  Laterality: Right;  . IR CATHETER TUBE CHANGE  11/22/2018  . NASAL SINUS SURGERY    . ORCHIECTOMY    . TONSILLECTOMY      There were no vitals filed for this visit.       [facility] PT Assessment - 01/01/21 0001      Assessment   Medical Diagnosis S/p R subtrochanteric hip fracture, S/p R ORIF fracture of hip.      Referring Provider (PT) Dr. Rudene Christians    Onset  Date/Surgical Date 09/15/18    Prior Therapy Yes, known well to PT      Prior Function   Level of Independence Independent with household mobility with device      Cognition   Overall Cognitive Status Within Functional Limits for tasks assessed            Pt. using Salonpas cream on knee for pain mgmt. Pt. states it helps a little and is "better than nothing". Pt. did not walk much this past weekend since son was not around to assist with outside walking.          There.ex.:  Standing 6" step touches at stairs (toe taps/ heel taps)- fatigue after 15x (heavy UE assist).    Step ups/ downs at 1st step with heavy UE assist on B handrail 10x on L.  No R LE step ups secondary to knee pain.    Seated LAQ/ marching/ heel and toe raises 20x each.No ankle wts. Today.  Standing abduction/ extension 10x2 each (heavy UE assist)- R knee pain limits  Nustep L5 10 minseat #12. B UE with MH to low back.(at end of appointment for endurance, not billed)   Neuro:   Standing tolerance in front of steps (added repeated sh. Flexion/ abduction/ IR)- 3 min. 19 sec. (pt. Requested to sit secondary to knee pain).  Walking in //-bars: increase step length forward/ backwards (exaggerated step length)- 4x   Sit to stands with focus on equal wt. Bearing/ proper technique.  Limited by R knee pain (modified)- mirror feedback.  Standing tolerance: fishing ex (1 bag of fish)- challenging placement.    Walking in clinic/ gym with cuing on BOS/ posture/ heel strike  Walking outside with rollator to car/ managing thresholds/ ramp.      Seated rest breaks taken between all sets      PT Long Term Goals - 01/01/21 0934      PT LONG TERM GOAL #1   Title Pt. independent with HEP to increase B hip flexion/ R quad muscle strength 1/2 muscle grade to improve pain-free mobility.    Baseline R knee extension limited to 4/5 MMT secondary to pain/ fear of pain. 2/9: Deferred.  4/13:  4+/5 MMT (max potential).    Time 4    Period Weeks    Status Achieved      PT LONG TERM GOAL #2   Title Pt. will increase Berg balance test to >40 out of 56 to improve independence with gait/ decrease fall risk.    Baseline Berg: 22/56 (significant fall risk); 9/30 32/56.  11/10: 32 (limited by R knee pain)  2/9: 33/56 limited due to R knee and Low Back pain; 8/10: 29/56; 10/5: 38/56, 11/18/20: 28    Time 8    Period Weeks    Status Partially Met    Target Date 02/24/21      PT LONG TERM GOAL #3   Title Pt. able to ambulate 100 feet with consistent 2 point gait pattern and use of least restrictive assistive devce to improve household mobility.    Baseline Pt. able to ambulate with use of RW/ CGA to min. A for safety.   Heavy use of B UE.; 9/30 pt able to ambulate with rollator with inconsistent 2 point gait pattern, heavy reliance on UE CGA/supervision  2/9: Pt. uses 2 point step pattern with walking with rollader with decreased stride length. Pt. would be limited due to pain in the knee walking for >100 ft.; 8/10: Pt demonstrates ability to ambulate 100' with consistent 2 point gait pattern with rollator. 11/18/20: Pt able to amb 130 ft with Rollator and 2-point gait pattern    Time 8    Period Weeks    Status Partially Met    Target Date 02/24/21      PT LONG TERM GOAL #4   Title Pt. able to stand from normal chair with no UE assist to improve safety/independence with transfers.     Baseline Unable to stand from standard chair without heavy UE assist. 10/6, pt unable to stand from standard chair without UE support.  11/10: benefits from 1 UE assist    2/9: Pt. can rise from normal chair with 1 UE assist and must control descent with UE assist.; 8/10: Pt requires 2 UE for standing from standard chair height.; 10/5: Requires single UE to stand. 11/18/20: Pt requires B UE support    Time 8    Period Weeks    Status Partially Met    Target Date 02/24/21      PT LONG TERM GOAL #5   Title Pt.  will perform a sit to stand with 1 UE support to be able to perform toileting activities with more independance.    Baseline Pt. requires 2 UE to push from to sit to stand from standard chair height. Pt.  has grab rails available but can not get up by pulling.; 8/10: pt requires 2 UE's to STS from standard chair height.; 10/5: Only required single UE support to stand. Use of momentum but able to perform independently.    Time 8    Period Weeks    Status Achieved      PT LONG TERM GOAL #6   Title Pt. will ascend/ descend 4 steps with use of B handrails and step pattern with mod. independence safely to be able to enter son's house.    Baseline Step pattern with heavy UE assist and CGA/min. A for safety.; 8/10: Pt safely asc/desc 4 steps with B hand rails and step to pattern with mod indep to enter son's house safely.    Time 0    Period Weeks    Status Achieved      PT LONG TERM GOAL #7   Title Pt will be able to stand at bathroom sink for 5 min with no UE support to complete ADL's like brushing his teeth, washing his hands, and combing his hair.    Baseline 8/10: Ability to stand with no UE support for 2 min.; 10/5: 3.5 min due to LBP. Has demonstrated in previous PT sessions he can stand for 5 min.    Time 8    Period Weeks    Status Partially Met    Target Date 02/24/21      PT LONG TERM GOAL #8   Title Pt will demonstrate ability to stand from a lowered surface below 18" with single Ue support to demonstrate improved BLE strength to improve transfers from toilet.    Baseline 10/5: Able to stand from 18" chair with single UE support with use of momentum. 11/18/20: able to after 3 attempts    Time 8    Period Weeks    Status Partially Met    Target Date 02/24/21            Pt. ambulates into PT clinic with slow, controlled gait pattern with heavy UE assist on RW, esp. with R LE wt. bearing due to R knee pain.  Pts. knee pain limits standing tolerance to <5 minutes and sit to stands  from commode/ low chairs.  Pt. did well with standing overhead tasks but requires L UE on //-bar to maintain balance/ decrease LE wt. bearing due to knee pain. Pt. works hard during PT and requires extra time with outside walking to car and managemet into passenger seat.  Pt. challenged with downgrade ramp while walking to car and takes shorter step length.  Pt. continues to be able to stand/ sit from commode independently with UE assist.  No recent falls or LOB at home.  Pt. will continue to benefit from skilled PT services 1x/week for focus on strengthening/ balance tasks.       Patient will benefit from skilled therapeutic intervention in order to improve the following deficits and impairments:  Abnormal gait,Decreased endurance,Decreased activity tolerance,Pain,Decreased balance,Impaired flexibility,Decreased strength,Postural dysfunction  Visit Diagnosis: Muscle weakness (generalized)  Gait difficulty  Abnormal posture  Balance problem     Problem List Patient Active Problem List   Diagnosis Date Noted  . DVT femoral (deep venous thrombosis) with thrombophlebitis, left ([place]) 02/06/2020  . Lymphedema 04/15/2019  . Bilateral leg edema 01/25/2019  . Hyponatremia 12/24/2018  . SIADH (syndrome of inappropriate ADH production) ([place]) 12/24/2018  . Erosion of urethra due to catheterization of urinary tract ([place]) 10/27/2018  . Generalized weakness 10/14/2018  .  Hip fracture ([place]) 09/15/2018  . 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 HLD (hyperlipidemia) 03/24/2018  . Contusion of right knee 02/14/2018  . Bradycardia 12/28/2017  . 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 Tremor 09/04/2016  . Chronic pain of right knee 08/10/2016  . Right ankle pain 08/10/2016  . 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  . 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[place]) 12/11/2015  . Urinary  retention 09/08/2015  . Lymphoma, 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[place]) 04/03/2015  . Breathlessness on exertion 11/21/2014  . Breath shortness 11/21/2014  . Arthropathy 11/07/2014  . Atrial flutter, paroxysmal ([place]) 11/07/2014  . Type 2 diabetes mellitus ([place]) 11/07/2014  . Benign essential tremor 11/07/2014  . Benign essential HTN 11/07/2014  . Mixed incontinence 11/07/2014  . Hypercholesterolemia without hypertriglyceridemia 11/07/2014  . Apnea, sleep 11/07/2014  . Controlled type 2 diabetes mellitus without complication ([place]) 52/17/4715  . Pure hypercholesterolemia 11/07/2014  . Other abnormalities of gait and mobility 11/02/2011  . Decreased mobility 11/02/2011  . Abnormal gait 06/29/2011  . Discoordination 06/29/2011   Pura Spice, PT, DPT # [phone] 01/01/2021, 9:36 AM  [facility] [address] [place], [place], [address] Phone: [phone]   Fax:  [phone]  Name: Marvion Bastidas MRN: [id number] Date of Birth: Jan 18, 1941

## 2021-01-05 ENCOUNTER — Encounter: Payer: Self-pay | Admitting: Physical Therapy

## 2021-01-05 ENCOUNTER — Other Ambulatory Visit: Payer: Self-pay

## 2021-01-05 ENCOUNTER — Ambulatory Visit: Payer: Medicare Other

## 2021-01-05 DIAGNOSIS — R293 Abnormal posture: Secondary | ICD-10-CM

## 2021-01-05 DIAGNOSIS — M6281 Muscle weakness (generalized): Secondary | ICD-10-CM

## 2021-01-05 DIAGNOSIS — R2689 Other abnormalities of gait and mobility: Secondary | ICD-10-CM

## 2021-01-05 DIAGNOSIS — R269 Unspecified abnormalities of gait and mobility: Secondary | ICD-10-CM

## 2021-01-05 NOTE — Therapy (Signed)
[facility] [address]. [place], [place], [address] Phone: [phone]   Fax:  [phone]  Physical Therapy Treatment  Patient Details  Name: Mike Holloway MRN: [id number] Date of Birth: [birth year] Referring Provider (PT): Dr. Rudene Christians   Encounter Date: 01/05/2021   PT End of Session - 01/05/21 1318    Visit Number 104    Number of Visits 111    Date for PT Re-Evaluation 02/24/21    Authorization - Visit Number 6    Authorization - Number of Visits 10    PT Start Time 3267    PT Stop Time 1351    PT Time Calculation (min) 49 min    Equipment Utilized During Treatment Gait belt    Activity Tolerance Patient tolerated treatment well;Patient limited by pain    Behavior During Therapy [name] for tasks assessed/performed           Past Medical History:  Diagnosis Date  . Arthritis   . Atrial flutter ([place])   . Diabetes mellitus ([place])   . Essential tremor   . Essential tremor    deep brain stimulator   . Hypercholesteremia   . Hypertension   . Incontinence   . 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[place])    grew on the testical  . SIADH (syndrome of inappropriate ADH production) ([place])   . Sleep apnea   . Stroke [facility])     Past Surgical History:  Procedure Laterality Date  . ABLATION    . APPENDECTOMY    . CATARACT EXTRACTION, BILATERAL    . COLONOSCOPY WITH PROPOFOL N/A 03/17/2018   Procedure: COLONOSCOPY WITH PROPOFOL;  Surgeon: Jonathon Bellows, MD;  Location: [facility] ENDOSCOPY;  Service: Gastroenterology;  Laterality: N/A;  . DEEP BRAIN STIMULATOR PLACEMENT    . 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 HEMORRHOID SURGERY    . HERNIA REPAIR    . HIP FRACTURE SURGERY    . INTRAMEDULLARY (IM) NAIL INTERTROCHANTERIC Right 09/16/2018   Procedure: INTRAMEDULLARY (IM) NAIL INTERTROCHANTRIC;  Surgeon: Hessie Knows, MD;   Location: [facility] ORS;  Service: Orthopedics;  Laterality: Right;  . IR CATHETER TUBE CHANGE  11/22/2018  . NASAL SINUS SURGERY    . ORCHIECTOMY    . TONSILLECTOMY      There were no vitals filed for this visit.   Subjective Assessment - 01/05/21 1311    Subjective Pr reports 2/10 R knee pain. No falls. Walking in household but not outdoors due to son not coming over.    Patient is accompained by: Family member    Pertinent History Pt. states R knee is bothering him but it is the typical discomfort he experiences    Limitations House hold activities;Walking;Standing;Lifting    How long can you sit comfortably? 2 hours    How long can you stand comfortably? <10 min    How long can you walk comfortably? approx 5-10 min    Patient Stated Goals Improve LE strength/ gait and balance with daily tasks.  Improve gait/ safety/ progression to least assistive device.    Currently in Pain? Yes    Pain Score 2     Pain Location Knee    Pain Orientation Right    Pain Onset More than a month ago    Pain Onset More than a month ago           Nu-Step  for 5 min. Not billed. Heat pack on lumbar spine. L5 at end of session.   Neuro Re-ed:   Ambulation in // bars forwards/backwards/side to side: back and forth/direction. Focus on gait mechanics and hip stability/strength with side steps. Heavy BUE support on // bars for support. SBA   Ambulating cone taps in // bars with BUE support. Focus on equal step lengths, foot clearance, and dynamic hip strength. x4  Standing at stairs: BUE support   Heel taps on 2nd step: x10/LE   Toe taps on 2nd step: x10/LE   Standing tolerance at stairs with tapping exercise: 3 min 54 sec. Heavy UE support.    Standing trunk rotation with yellow exercise ball to challenge postural stability and work on standing balance/tolerance. 2x30 sec. Second bout of overhead/diagnol motions outside BOS. CGA for safety.      Pt limited in standing tolerance to almost 4 min relying  on heavy UE support. Frequent seated rest breaks required and extra time to perform exercises.    PT Education - 01/05/21 1318    Education Details form/technique with exercise.    Person(s) Educated Patient    Methods Explanation;Demonstration;Verbal cues    Comprehension Verbalized understanding;Returned demonstration               PT Long Term Goals - 01/01/21 0934      PT LONG TERM GOAL #1   Title Pt. independent with HEP to increase B hip flexion/ R quad muscle strength 1/2 muscle grade to improve pain-free mobility.    Baseline R knee extension limited to 4/5 MMT secondary to pain/ fear of pain. 2/9: Deferred.  4/13: 4+/5 MMT (max potential).    Time 4    Period Weeks    Status Achieved      PT LONG TERM GOAL #2   Title Pt. will increase Berg balance test to >40 out of 56 to improve independence with gait/ decrease fall risk.    Baseline Berg: 22/56 (significant fall risk); 9/30 32/56.  11/10: 32 (limited by R knee pain)  2/9: 33/56 limited due to R knee and Low Back pain; 8/10: 29/56; 10/5: 38/56, 11/18/20: 28    Time 8    Period Weeks    Status Partially Met    Target Date 02/24/21      PT LONG TERM GOAL #3   Title Pt. able to ambulate 100 feet with consistent 2 point gait pattern and use of least restrictive assistive devce to improve household mobility.    Baseline Pt. able to ambulate with use of RW/ CGA to min. A for safety.   Heavy use of B UE.; 9/30 pt able to ambulate with rollator with inconsistent 2 point gait pattern, heavy reliance on UE CGA/supervision  2/9: Pt. uses 2 point step pattern with walking with rollader with decreased stride length. Pt. would be limited due to pain in the knee walking for >100 ft.; 8/10: Pt demonstrates ability to ambulate 100' with consistent 2 point gait pattern with rollator. 11/18/20: Pt able to amb 130 ft with Rollator and 2-point gait pattern    Time 8    Period Weeks    Status Partially Met    Target Date 02/24/21      PT  LONG TERM GOAL #4   Title Pt. able to stand from normal chair with no UE assist to improve safety/independence with transfers.     Baseline Unable to stand from standard chair without heavy UE assist. 10/6, pt unable  to stand from standard chair without UE support.  11/10: benefits from 1 UE assist    2/9: Pt. can rise from normal chair with 1 UE assist and must control descent with UE assist.; 8/10: Pt requires 2 UE for standing from standard chair height.; 10/5: Requires single UE to stand. 11/18/20: Pt requires B UE support    Time 8    Period Weeks    Status Partially Met    Target Date 02/24/21      PT LONG TERM GOAL #5   Title Pt. will perform a sit to stand with 1 UE support to be able to perform toileting activities with more independance.    Baseline Pt. requires 2 UE to push from to sit to stand from standard chair height. Pt. has grab rails available but can not get up by pulling.; 8/10: pt requires 2 UE's to STS from standard chair height.; 10/5: Only required single UE support to stand. Use of momentum but able to perform independently.    Time 8    Period Weeks    Status Achieved      PT LONG TERM GOAL #6   Title Pt. will ascend/ descend 4 steps with use of B handrails and step pattern with mod. independence safely to be able to enter son's house.    Baseline Step pattern with heavy UE assist and CGA/min. A for safety.; 8/10: Pt safely asc/desc 4 steps with B hand rails and step to pattern with mod indep to enter son's house safely.    Time 0    Period Weeks    Status Achieved      PT LONG TERM GOAL #7   Title Pt will be able to stand at bathroom sink for 5 min with no UE support to complete ADL's like brushing his teeth, washing his hands, and combing his hair.    Baseline 8/10: Ability to stand with no UE support for 2 min.; 10/5: 3.5 min due to LBP. Has demonstrated in previous PT sessions he can stand for 5 min.    Time 8    Period Weeks    Status Partially Met    Target  Date 02/24/21      PT LONG TERM GOAL #8   Title Pt will demonstrate ability to stand from a lowered surface below 18" with single Ue support to demonstrate improved BLE strength to improve transfers from toilet.    Baseline 10/5: Able to stand from 18" chair with single UE support with use of momentum. 11/18/20: able to after 3 attempts    Time 8    Period Weeks    Status Partially Met    Target Date 02/24/21                 Plan - 01/05/21 1519    Clinical Impression Statement Pt appears to be at baseline mobility per previous documentation with slow, controlled gait with rollator and dominant UE support. Pt remains limited in standing tolerance to just shy of 4 min increments with standing exercises and relies on heavy UE assist. Limited in his tolerance due to recent bout of increased R knee pain. Pt remains highly motivate with therapy and continues to rely on PT assist to safely ambulate to his car and transferring into passenger seat. Pt will continue to benefit from skilled PT services to maintain current level of mobility and modified independence at home to slow progression of progressive neurological disease.    Personal Factors  and Comorbidities Age    Examination-Activity Limitations Toileting;Stand;Squat;Lift;Stairs    Stability/Clinical Decision Making Evolving/Moderate complexity    Rehab Potential Fair    PT Frequency 1x / week    PT Duration 8 weeks    PT Treatment/Interventions Moist Heat;Functional mobility training;Therapeutic activities;Therapeutic exercise;Balance training;Patient/family education;Manual techniques;Passive range of motion;Neuromuscular re-education;Stair training;Gait training;ADLs/Self Care Home Management    PT Next Visit Plan Continue to increase standing balance and stability to decrease risk of falls.  Try COMPRESSION KNEE BRACE during PT    PT Home Exercise Plan [email or web address]    Consulted and Agree with Plan of Care Patient;Family member/caregiver     Family Member Consulted spouse: [name]           Patient will benefit from skilled therapeutic intervention in order to improve the following deficits and impairments:  Abnormal gait,Decreased endurance,Decreased activity tolerance,Pain,Decreased balance,Impaired flexibility,Decreased strength,Postural dysfunction  Visit Diagnosis: Muscle weakness (generalized)  Gait difficulty  Abnormal posture  Balance problem     Problem List Patient Active Problem List   Diagnosis Date Noted  . DVT femoral (deep venous thrombosis) with thrombophlebitis, left ([place]) 02/06/2020  . Lymphedema 04/15/2019  . Bilateral leg edema 01/25/2019  . Hyponatremia 12/24/2018  . SIADH (syndrome of inappropriate ADH production) ([place]) 12/24/2018  . Erosion of urethra due to catheterization of urinary tract ([place]) 10/27/2018  . Generalized weakness 10/14/2018  . Hip fracture ([place]) 09/15/2018  . 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 HLD (hyperlipidemia) 03/24/2018  . Contusion of right knee 02/14/2018  . Bradycardia 12/28/2017  . 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 Tremor 09/04/2016  . Chronic pain of right knee 08/10/2016  . Right ankle pain 08/10/2016  . 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  . 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[place]) 12/11/2015  . Urinary retention 09/08/2015  . Lymphoma, 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[place]) 04/03/2015  . Breathlessness on exertion 11/21/2014  . Breath shortness 11/21/2014  . Arthropathy 11/07/2014  . Atrial flutter, paroxysmal ([place]) 11/07/2014  . Type 2 diabetes mellitus ([place]) 11/07/2014  . Benign essential tremor 11/07/2014  . Benign essential HTN 11/07/2014  . Mixed incontinence 11/07/2014  . Hypercholesterolemia without hypertriglyceridemia 11/07/2014  . Apnea, sleep 11/07/2014  . Controlled type 2 diabetes mellitus without complication ([place]) 17/40/8144  .  Pure hypercholesterolemia 11/07/2014  . Other abnormalities of gait and mobility 11/02/2011  . Decreased mobility 11/02/2011  . Abnormal gait 06/29/2011  . Discoordination 06/29/2011    Salem Caster. Fairly IV, PT, DPT Physical Therapist- [facility]  01/05/2021, 3:24 PM  [facility] [address] [place], [place], [address] Phone: [phone]   Fax:  [phone]  Name: Mike Holloway MRN: [id number] Date of Birth: 12-14-1940

## 2021-01-06 ENCOUNTER — Encounter: Payer: Medicare Other | Admitting: Physical Therapy

## 2021-01-13 ENCOUNTER — Other Ambulatory Visit: Payer: Self-pay

## 2021-01-13 ENCOUNTER — Ambulatory Visit: Payer: Medicare Other | Admitting: Physical Therapy

## 2021-01-13 DIAGNOSIS — M6281 Muscle weakness (generalized): Secondary | ICD-10-CM | POA: Diagnosis not present

## 2021-01-13 DIAGNOSIS — R2689 Other abnormalities of gait and mobility: Secondary | ICD-10-CM

## 2021-01-13 DIAGNOSIS — R293 Abnormal posture: Secondary | ICD-10-CM

## 2021-01-13 DIAGNOSIS — R269 Unspecified abnormalities of gait and mobility: Secondary | ICD-10-CM

## 2021-01-14 ENCOUNTER — Ambulatory Visit (INDEPENDENT_AMBULATORY_CARE_PROVIDER_SITE_OTHER): Payer: Medicare Other | Admitting: Physician Assistant

## 2021-01-14 DIAGNOSIS — R339 Retention of urine, unspecified: Secondary | ICD-10-CM | POA: Diagnosis not present

## 2021-01-14 NOTE — Progress Notes (Signed)
Cath Change/ Replacement  Patient is present today for a catheter change due to urinary retention.  14ml of water was removed from the balloon, a 16FR coude foley cath was removed without difficulty.  Patient was cleaned and prepped in a sterile fashion with betadine and 2% lidocaine jelly was instilled into the urethra. A 16 FR foley cath was replaced into the bladder no complications were noted Urine return was noted 11ml and urine was yellow in color. The balloon was filled with 70ml of sterile water. A leg bag was attached for drainage.  A night bag and extension tubing were also given to the patient and patient was given instruction on how to change from one bag to another. Patient was given proper instruction on catheter care.    Performed by: Debroah Loop, PA-C   Follow up: Return in about 4 weeks (around 02/11/2021) for Catheter exchange.

## 2021-01-16 ENCOUNTER — Encounter: Payer: Self-pay | Admitting: Physical Therapy

## 2021-01-16 NOTE — Therapy (Signed)
[facility] ([facility]) [facility] [address] [place], [place], [address] Phone: [phone]   Fax:  [phone]  Physical Therapy Treatment  Patient Details  Name: Mike Holloway MRN: [id number] Date of Birth: [birth year] Referring Provider (PT): Dr. Rudene Christians   Encounter Date: 01/13/2021   PT End of Session - 01/16/21 1618    Visit Number 105    Number of Visits 111    Date for PT Re-Evaluation 02/24/21    Authorization - Visit Number 7    Authorization - Number of Visits 10    PT Start Time 3419    PT Stop Time 1412    PT Time Calculation (min) 68 min    Equipment Utilized During Treatment Gait belt    Activity Tolerance Patient tolerated treatment well;Patient limited by pain    Behavior During Therapy [facility] for tasks assessed/performed           Past Medical History:  Diagnosis Date  . Arthritis   . Atrial flutter ([place])   . Diabetes mellitus ([place])   . Essential tremor   . Essential tremor    deep brain stimulator   . Hypercholesteremia   . Hypertension   . Incontinence   . 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[place])    grew on the testical  . SIADH (syndrome of inappropriate ADH production) ([place])   . Sleep apnea   . Stroke [facility])     Past Surgical History:  Procedure Laterality Date  . ABLATION    . APPENDECTOMY    . CATARACT EXTRACTION, BILATERAL    . COLONOSCOPY WITH PROPOFOL N/A 03/17/2018   Procedure: COLONOSCOPY WITH PROPOFOL;  Surgeon: Jonathon Bellows, MD;  Location: [facility] ENDOSCOPY;  Service: Gastroenterology;  Laterality: N/A;  . DEEP BRAIN STIMULATOR PLACEMENT    . 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 HEMORRHOID SURGERY    . HERNIA REPAIR    . HIP FRACTURE SURGERY    . INTRAMEDULLARY (IM) NAIL INTERTROCHANTERIC Right 09/16/2018   Procedure: INTRAMEDULLARY (IM) NAIL INTERTROCHANTRIC;  Surgeon: Hessie Knows, MD;   Location: [facility] ORS;  Service: Orthopedics;  Laterality: Right;  . IR CATHETER TUBE CHANGE  11/22/2018  . NASAL SINUS SURGERY    . ORCHIECTOMY    . TONSILLECTOMY      There were no vitals filed for this visit.   Subjective Assessment - 01/16/21 1615    Subjective Pt. entered PT with no new complaints.  Pt. continues to remain frustrated with persistent R knee pain during standing/walking tasks.  Pt. understands he has very few options to manage pain in R knee and has to continue to stay active.    Patient is accompained by: Family member    Pertinent History Pt. states R knee is bothering him but it is the typical discomfort he experiences    Limitations House hold activities;Walking;Standing;Lifting    How long can you sit comfortably? 2 hours    How long can you stand comfortably? <10 min    How long can you walk comfortably? approx 5-10 min    Patient Stated Goals Improve LE strength/ gait and balance with daily tasks.  Improve gait/ safety/ progression to least assistive device.    Currently in Pain? Yes    Pain Score 2     Pain Location Knee    Pain Orientation Right    Pain Descriptors / Indicators Constant;Aching  Pain Onset More than a month ago    Pain Onset More than a month ago           There.ex.:  Seated marching/ LAQ/ hip abduction/ toe and heel raises 20x each.    Standing marching 20x (heavy UE assist).  Forward/ lateral walking in //-bars with UE assist required (increase knee pain)  PT reassessed R knee mobility/ discussed copper knee brace for support  Nu-Step for 10 min. with MH on lumbar spine. L5 at end of session.     Neuro Re-ed:   Ambulating cone taps in // bars with BUE support. Focus on equal step lengths, foot clearance, and dynamic hip strength. x4              Standing at stairs: BUE support                         Heel taps on 1st and 2nd step: x10/LE                         Toe taps on 1st and 2nd step: x10/LE  Standing tolerance in  //-bars with added shoulder flexion/ reaching (mirror feedback).   <5 min. At max   Standing trunk rotation with yellow exercise ball to challenge postural stability and work on standing balance/tolerance. 2x30 sec.      Frequent seated rest breaks required and extra time to perform exercises.      PT Long Term Goals - 01/01/21 0934      PT LONG TERM GOAL #1   Title Pt. independent with HEP to increase B hip flexion/ R quad muscle strength 1/2 muscle grade to improve pain-free mobility.    Baseline R knee extension limited to 4/5 MMT secondary to pain/ fear of pain. 2/9: Deferred.  4/13: 4+/5 MMT (max potential).    Time 4    Period Weeks    Status Achieved      PT LONG TERM GOAL #2   Title Pt. will increase Berg balance test to >40 out of 56 to improve independence with gait/ decrease fall risk.    Baseline Berg: 22/56 (significant fall risk); 9/30 32/56.  11/10: 32 (limited by R knee pain)  2/9: 33/56 limited due to R knee and Low Back pain; 8/10: 29/56; 10/5: 38/56, 11/18/20: 28    Time 8    Period Weeks    Status Partially Met    Target Date 02/24/21      PT LONG TERM GOAL #3   Title Pt. able to ambulate 100 feet with consistent 2 point gait pattern and use of least restrictive assistive devce to improve household mobility.    Baseline Pt. able to ambulate with use of RW/ CGA to min. A for safety.   Heavy use of B UE.; 9/30 pt able to ambulate with rollator with inconsistent 2 point gait pattern, heavy reliance on UE CGA/supervision  2/9: Pt. uses 2 point step pattern with walking with rollader with decreased stride length. Pt. would be limited due to pain in the knee walking for >100 ft.; 8/10: Pt demonstrates ability to ambulate 100' with consistent 2 point gait pattern with rollator. 11/18/20: Pt able to amb 130 ft with Rollator and 2-point gait pattern    Time 8    Period Weeks    Status Partially Met    Target Date 02/24/21      PT LONG TERM GOAL #4  Title Pt. able to  stand from normal chair with no UE assist to improve safety/independence with transfers.     Baseline Unable to stand from standard chair without heavy UE assist. 10/6, pt unable to stand from standard chair without UE support.  11/10: benefits from 1 UE assist    2/9: Pt. can rise from normal chair with 1 UE assist and must control descent with UE assist.; 8/10: Pt requires 2 UE for standing from standard chair height.; 10/5: Requires single UE to stand. 11/18/20: Pt requires B UE support    Time 8    Period Weeks    Status Partially Met    Target Date 02/24/21      PT LONG TERM GOAL #5   Title Pt. will perform a sit to stand with 1 UE support to be able to perform toileting activities with more independance.    Baseline Pt. requires 2 UE to push from to sit to stand from standard chair height. Pt. has grab rails available but can not get up by pulling.; 8/10: pt requires 2 UE's to STS from standard chair height.; 10/5: Only required single UE support to stand. Use of momentum but able to perform independently.    Time 8    Period Weeks    Status Achieved      PT LONG TERM GOAL #6   Title Pt. will ascend/ descend 4 steps with use of B handrails and step pattern with mod. independence safely to be able to enter son's house.    Baseline Step pattern with heavy UE assist and CGA/min. A for safety.; 8/10: Pt safely asc/desc 4 steps with B hand rails and step to pattern with mod indep to enter son's house safely.    Time 0    Period Weeks    Status Achieved      PT LONG TERM GOAL #7   Title Pt will be able to stand at bathroom sink for 5 min with no UE support to complete ADL's like brushing his teeth, washing his hands, and combing his hair.    Baseline 8/10: Ability to stand with no UE support for 2 min.; 10/5: 3.5 min due to LBP. Has demonstrated in previous PT sessions he can stand for 5 min.    Time 8    Period Weeks    Status Partially Met    Target Date 02/24/21      PT LONG TERM GOAL  #8   Title Pt will demonstrate ability to stand from a lowered surface below 18" with single Ue support to demonstrate improved BLE strength to improve transfers from toilet.    Baseline 10/5: Able to stand from 18" chair with single UE support with use of momentum. 11/18/20: able to after 3 attempts    Time 8    Period Weeks    Status Partially Met    Target Date 02/24/21                 Plan - 01/16/21 1619    Clinical Impression Statement Pt. limited to 5 min. max. standing endurance without UE assist due to R knee/ low back pain.  Pts. pain worsens t/o tx. with sit to stands and walking around PT clinic with use of rollator.  PT discussed the benefits of using a compression neoprene brace to R knee to during standing/ functional tasks at home to help manage knee pain/ support.  Added shoulder flexion/ reaching to standing tolerance tasks to challenge  center of gravity/ balance.  Pt. did well during tx. and will start walking more outside at home with son since weather is getting nicer.    Personal Factors and Comorbidities Age    Examination-Activity Limitations Toileting;Stand;Squat;Lift;Stairs    Stability/Clinical Decision Making Evolving/Moderate complexity    Clinical Decision Making Moderate    Rehab Potential Fair    PT Frequency 1x / week    PT Duration 8 weeks    PT Treatment/Interventions Moist Heat;Functional mobility training;Therapeutic activities;Therapeutic exercise;Balance training;Patient/family education;Manual techniques;Passive range of motion;Neuromuscular re-education;Stair training;Gait training;ADLs/Self Care Home Management    PT Next Visit Plan Continue to increase standing balance and stability to decrease risk of falls.  Try COMPRESSION KNEE BRACE during PT    PT Home Exercise Plan [email or web address]    Consulted and Agree with Plan of Care Patient;Family member/caregiver    Family Member Consulted spouse: [name]           Patient will benefit from skilled  therapeutic intervention in order to improve the following deficits and impairments:  Abnormal gait,Decreased endurance,Decreased activity tolerance,Pain,Decreased balance,Impaired flexibility,Decreased strength,Postural dysfunction  Visit Diagnosis: Muscle weakness (generalized)  Gait difficulty  Abnormal posture  Balance problem     Problem List Patient Active Problem List   Diagnosis Date Noted  . DVT femoral (deep venous thrombosis) with thrombophlebitis, left ([place]) 02/06/2020  . Lymphedema 04/15/2019  . Bilateral leg edema 01/25/2019  . Hyponatremia 12/24/2018  . SIADH (syndrome of inappropriate ADH production) ([place]) 12/24/2018  . Erosion of urethra due to catheterization of urinary tract ([place]) 10/27/2018  . Generalized weakness 10/14/2018  . Hip fracture ([place]) 09/15/2018  . 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 HLD (hyperlipidemia) 03/24/2018  . Contusion of right knee 02/14/2018  . Bradycardia 12/28/2017  . 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 Tremor 09/04/2016  . Chronic pain of right knee 08/10/2016  . Right ankle pain 08/10/2016  . 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  . 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[place]) 12/11/2015  . Urinary retention 09/08/2015  . Lymphoma, 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[place]) 04/03/2015  . Breathlessness on exertion 11/21/2014  . Breath shortness 11/21/2014  . Arthropathy 11/07/2014  . Atrial flutter, paroxysmal ([place]) 11/07/2014  . Type 2 diabetes mellitus ([place]) 11/07/2014  . Benign essential tremor 11/07/2014  . Benign essential HTN 11/07/2014  . Mixed incontinence 11/07/2014  . Hypercholesterolemia without hypertriglyceridemia 11/07/2014  . Apnea, sleep 11/07/2014  . Controlled type 2 diabetes mellitus without complication ([place]) 38/32/9191  . Pure hypercholesterolemia 11/07/2014  . Other abnormalities of gait and mobility  11/02/2011  . Decreased mobility 11/02/2011  . Abnormal gait 06/29/2011  . Discoordination 06/29/2011   Pura Spice, PT, DPT # [phone] 01/16/2021, 4:25 PM  [facility] [address] [place], [place], [address] Phone: [phone]   Fax:  [phone]  Name: Mike Holloway MRN: [id number] Date of Birth: [birth year]

## 2021-01-20 ENCOUNTER — Other Ambulatory Visit: Payer: Self-pay

## 2021-01-20 ENCOUNTER — Ambulatory Visit: Payer: Medicare Other | Admitting: Physical Therapy

## 2021-01-20 ENCOUNTER — Encounter: Payer: Self-pay | Admitting: Physical Therapy

## 2021-01-20 DIAGNOSIS — M6281 Muscle weakness (generalized): Secondary | ICD-10-CM | POA: Diagnosis not present

## 2021-01-20 DIAGNOSIS — R293 Abnormal posture: Secondary | ICD-10-CM

## 2021-01-20 DIAGNOSIS — R269 Unspecified abnormalities of gait and mobility: Secondary | ICD-10-CM

## 2021-01-20 DIAGNOSIS — R2689 Other abnormalities of gait and mobility: Secondary | ICD-10-CM

## 2021-01-20 NOTE — Therapy (Signed)
[facility] [place] [facility] [address] [place], [place], [address] Phone: [phone]   Fax:  [phone]  Physical Therapy Treatment  Patient Details  Name: Mike Holloway MRN: [id number] Date of Birth: 09/20/1941 Referring Provider (PT): Dr. Rudene Christians   Encounter Date: 01/20/2021    Treatment: 106 of 111.  Recert date: 03/27/7792 1258 to 1402   Past Medical History:  Diagnosis Date  . Arthritis   . Atrial flutter ([place])   . Diabetes mellitus ([place])   . Essential tremor   . Essential tremor    deep brain stimulator   . Hypercholesteremia   . Hypertension   . Incontinence   . 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[place])    grew on the testical  . SIADH (syndrome of inappropriate ADH production) ([place])   . Sleep apnea   . Stroke [facility])     Past Surgical History:  Procedure Laterality Date  . ABLATION    . APPENDECTOMY    . CATARACT EXTRACTION, BILATERAL    . COLONOSCOPY WITH PROPOFOL N/A 03/17/2018   Procedure: COLONOSCOPY WITH PROPOFOL;  Surgeon: Jonathon Bellows, MD;  Location: [facility] ENDOSCOPY;  Service: Gastroenterology;  Laterality: N/A;  . DEEP BRAIN STIMULATOR PLACEMENT    . 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 HEMORRHOID SURGERY    . HERNIA REPAIR    . HIP FRACTURE SURGERY    . INTRAMEDULLARY (IM) NAIL INTERTROCHANTERIC Right 09/16/2018   Procedure: INTRAMEDULLARY (IM) NAIL INTERTROCHANTRIC;  Surgeon: Hessie Knows, MD;  Location: [facility] ORS;  Service: Orthopedics;  Laterality: Right;  . IR CATHETER TUBE CHANGE  11/22/2018  . NASAL SINUS SURGERY    . ORCHIECTOMY    . TONSILLECTOMY      There were no vitals filed for this visit.   Pt. reports R knee is better today than last visit. Pt. did mostly indoor walking this past weekend.      Neuro Re-ed:   Forward/ backwards walking in //-bars 3x with UE assist required  (mirror feedback).    Ambulating green hurdles 3 laps in //-bars and cone taps 2 laps in //-bars with BUE support. Focus on equal step lengths, foot clearance, and dynamic hip strength. x4  Standing at stairs (4# ankle wt): BUE support Heel taps on 6" step: x10/LE Toe taps on 6" step: x10/LE  Standing trunk rotation with yellow exercise ball to challenge postural stability and work on standing balance/tolerance. 2x30 sec.   Step ups/ downs at stairs with B UE assist 14x.   There.ex.:  Seated marching/ LAQ/ hip abduction/ toe and heel raises 20x each.    Standing marching (4# ankle wt.) 20x.  Forward/ lateral walking (no ankle wts.) in //-bars with UE assist required (increase knee pain)  Nu-Step for 10 min. with MH on lumbar spine. L5at end of session.       Several seated rest breaks requiredand extra time to perform exercises.     PT Long Term Goals - 01/01/21 0934      PT LONG TERM GOAL #1   Title Pt. independent with HEP to increase B hip flexion/ R quad muscle strength 1/2 muscle grade to improve pain-free mobility.    Baseline R knee extension limited to 4/5 MMT secondary to pain/ fear of pain. 2/9: Deferred.  4/13: 4+/5 MMT (max potential).    Time 4    Period Weeks  Status Achieved      PT LONG TERM GOAL #2   Title Pt. will increase Berg balance test to >40 out of 56 to improve independence with gait/ decrease fall risk.    Baseline Berg: 22/56 (significant fall risk); 9/30 32/56.  11/10: 32 (limited by R knee pain)  2/9: 33/56 limited due to R knee and Low Back pain; 8/10: 29/56; 10/5: 38/56, 11/18/20: 28    Time 8    Period Weeks    Status Partially Met    Target Date 02/24/21      PT LONG TERM GOAL #3   Title Pt. able to ambulate 100 feet with consistent 2 point gait pattern and use of least restrictive assistive devce to improve household mobility.    Baseline Pt. able to ambulate with  use of RW/ CGA to min. A for safety.   Heavy use of B UE.; 9/30 pt able to ambulate with rollator with inconsistent 2 point gait pattern, heavy reliance on UE CGA/supervision  2/9: Pt. uses 2 point step pattern with walking with rollader with decreased stride length. Pt. would be limited due to pain in the knee walking for >100 ft.; 8/10: Pt demonstrates ability to ambulate 100' with consistent 2 point gait pattern with rollator. 11/18/20: Pt able to amb 130 ft with Rollator and 2-point gait pattern    Time 8    Period Weeks    Status Partially Met    Target Date 02/24/21      PT LONG TERM GOAL #4   Title Pt. able to stand from normal chair with no UE assist to improve safety/independence with transfers.     Baseline Unable to stand from standard chair without heavy UE assist. 10/6, pt unable to stand from standard chair without UE support.  11/10: benefits from 1 UE assist    2/9: Pt. can rise from normal chair with 1 UE assist and must control descent with UE assist.; 8/10: Pt requires 2 UE for standing from standard chair height.; 10/5: Requires single UE to stand. 11/18/20: Pt requires B UE support    Time 8    Period Weeks    Status Partially Met    Target Date 02/24/21      PT LONG TERM GOAL #5   Title Pt. will perform a sit to stand with 1 UE support to be able to perform toileting activities with more independance.    Baseline Pt. requires 2 UE to push from to sit to stand from standard chair height. Pt. has grab rails available but can not get up by pulling.; 8/10: pt requires 2 UE's to STS from standard chair height.; 10/5: Only required single UE support to stand. Use of momentum but able to perform independently.    Time 8    Period Weeks    Status Achieved      PT LONG TERM GOAL #6   Title Pt. will ascend/ descend 4 steps with use of B handrails and step pattern with mod. independence safely to be able to enter son's house.    Baseline Step pattern with heavy UE assist and CGA/min.  A for safety.; 8/10: Pt safely asc/desc 4 steps with B hand rails and step to pattern with mod indep to enter son's house safely.    Time 0    Period Weeks    Status Achieved      PT LONG TERM GOAL #7   Title Pt will be able to stand at bathroom  sink for 5 min with no UE support to complete ADL's like brushing his teeth, washing his hands, and combing his hair.    Baseline 8/10: Ability to stand with no UE support for 2 min.; 10/5: 3.5 min due to LBP. Has demonstrated in previous PT sessions he can stand for 5 min.    Time 8    Period Weeks    Status Partially Met    Target Date 02/24/21      PT LONG TERM GOAL #8   Title Pt will demonstrate ability to stand from a lowered surface below 18" with single Ue support to demonstrate improved BLE strength to improve transfers from toilet.    Baseline 10/5: Able to stand from 18" chair with single UE support with use of momentum. 11/18/20: able to after 3 attempts    Time 8    Period Weeks    Status Partially Met    Target Date 02/24/21            Pt. able to complete standing ther.ex. with ankle wts. today to promote LE strengthening/ balance. Pt. remains safe with standing tasks and walking with use of RW. Increase in R knee pain noted with prolonged wt. bearing/ standing tasks and PT discussed the use of compression sleeve/ brace to assist with knee suport. Pts. wife was given a handout with pictures of varying braces to assist pt. with mobility. Pt. requires several seated rest breaks after standing tasks due to knee pain, not fatigue.       Patient will benefit from skilled therapeutic intervention in order to improve the following deficits and impairments:  Abnormal gait,Decreased endurance,Decreased activity tolerance,Pain,Decreased balance,Impaired flexibility,Decreased strength,Postural dysfunction  Visit Diagnosis: Muscle weakness (generalized)  Gait difficulty  Abnormal posture  Balance problem     Problem  List Patient Active Problem List   Diagnosis Date Noted  . DVT femoral (deep venous thrombosis) with thrombophlebitis, left ([place]) 02/06/2020  . Lymphedema 04/15/2019  . Bilateral leg edema 01/25/2019  . Hyponatremia 12/24/2018  . SIADH (syndrome of inappropriate ADH production) ([place]) 12/24/2018  . Erosion of urethra due to catheterization of urinary tract ([place]) 10/27/2018  . Generalized weakness 10/14/2018  . Hip fracture ([place]) 09/15/2018  . 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 HLD (hyperlipidemia) 03/24/2018  . Contusion of right knee 02/14/2018  . Bradycardia 12/28/2017  . 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 Tremor 09/04/2016  . Chronic pain of right knee 08/10/2016  . Right ankle pain 08/10/2016  . 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  . 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[place]) 12/11/2015  . Urinary retention 09/08/2015  . Lymphoma, 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[place]) 04/03/2015  . Breathlessness on exertion 11/21/2014  . Breath shortness 11/21/2014  . Arthropathy 11/07/2014  . Atrial flutter, paroxysmal ([place]) 11/07/2014  . Type 2 diabetes mellitus ([place]) 11/07/2014  . Benign essential tremor 11/07/2014  . Benign essential HTN 11/07/2014  . Mixed incontinence 11/07/2014  . Hypercholesterolemia without hypertriglyceridemia 11/07/2014  . Apnea, sleep 11/07/2014  . Controlled type 2 diabetes mellitus without complication ([place]) 01/75/1025  . Pure hypercholesterolemia 11/07/2014  . Other abnormalities of gait and mobility 11/02/2011  . Decreased mobility 11/02/2011  . Abnormal gait 06/29/2011  . Discoordination 06/29/2011   Pura Spice, PT, DPT # [id number] 01/23/2021, 1:01 PM  [place] [facility] [address] [place], [place], [address] Phone: [phone]   Fax:  [phone]  Name: Mike Dibbles  Gavinn Holloway MRN: [id number] Date of  Birth: [birth year]

## 2021-01-27 ENCOUNTER — Ambulatory Visit: Payer: Medicare Other | Attending: Orthopedic Surgery | Admitting: Physical Therapy

## 2021-01-27 ENCOUNTER — Other Ambulatory Visit: Payer: Self-pay

## 2021-01-27 DIAGNOSIS — M6281 Muscle weakness (generalized): Secondary | ICD-10-CM | POA: Insufficient documentation

## 2021-01-27 DIAGNOSIS — R2689 Other abnormalities of gait and mobility: Secondary | ICD-10-CM | POA: Diagnosis present

## 2021-01-27 DIAGNOSIS — R293 Abnormal posture: Secondary | ICD-10-CM

## 2021-01-27 DIAGNOSIS — R269 Unspecified abnormalities of gait and mobility: Secondary | ICD-10-CM | POA: Diagnosis present

## 2021-01-30 NOTE — Therapy (Signed)
[facility] [address] [place], [place], [address] Phone: [phone]   Fax:  [phone]  Physical Therapy Treatment  Patient Details  Name: Mike Holloway MRN: [id number] Date of Birth: 04-01-1941 Referring Provider (PT): Dr. Rudene Christians   Encounter Date: 01/27/2021   PT End of Session - 01/30/21 1547    Visit Number 107    Number of Visits 111    Date for PT Re-Evaluation 02/24/21    Authorization - Visit Number 9    Authorization - Number of Visits 10    PT Start Time 3810    PT Stop Time 1405    PT Time Calculation (min) 59 min    Equipment Utilized During Treatment Gait belt    Activity Tolerance Patient tolerated treatment well;Patient limited by pain    Behavior During Therapy [facility] for tasks assessed/performed           Past Medical History:  Diagnosis Date  . Arthritis   . Atrial flutter ([place])   . Diabetes mellitus ([place])   . Essential tremor   . Essential tremor    deep brain stimulator   . Hypercholesteremia   . Hypertension   . Incontinence   . 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[place])    grew on the testical  . SIADH (syndrome of inappropriate ADH production) ([place])   . Sleep apnea   . Stroke [facility])     Past Surgical History:  Procedure Laterality Date  . ABLATION    . APPENDECTOMY    . CATARACT EXTRACTION, BILATERAL    . COLONOSCOPY WITH PROPOFOL N/A 03/17/2018   Procedure: COLONOSCOPY WITH PROPOFOL;  Surgeon: Jonathon Bellows, MD;  Location: [facility] ENDOSCOPY;  Service: Gastroenterology;  Laterality: N/A;  . DEEP BRAIN STIMULATOR PLACEMENT    . 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 HEMORRHOID SURGERY    . HERNIA REPAIR    . HIP FRACTURE SURGERY    . INTRAMEDULLARY (IM) NAIL INTERTROCHANTERIC Right 09/16/2018   Procedure: INTRAMEDULLARY (IM) NAIL INTERTROCHANTRIC;  Surgeon: Hessie Knows, MD;   Location: [facility] ORS;  Service: Orthopedics;  Laterality: Right;  . IR CATHETER TUBE CHANGE  11/22/2018  . NASAL SINUS SURGERY    . ORCHIECTOMY    . TONSILLECTOMY      There were no vitals filed for this visit.   Subjective Assessment - 01/30/21 1544    Subjective Pt. reports no new complaints.  Pts. wife states pt. has not been too active at home.  Pt. walking around house with use of RW and no LOB or falls reported.  Pt. planning on getting knee brace and waiting for brace to be in stock at store.    Patient is accompained by: Family member    Pertinent History Pt. states R knee is bothering him but it is the typical discomfort he experiences    Limitations House hold activities;Walking;Standing;Lifting    How long can you sit comfortably? 2 hours    How long can you stand comfortably? <10 min    How long can you walk comfortably? approx 5-10 min    Patient Stated Goals Improve LE strength/ gait and balance with daily tasks.  Improve gait/ safety/ progression to least assistive device.    Currently in Pain? Yes    Pain Score 2     Pain Location Knee    Pain Orientation Right  Pain Onset More than a month ago    Pain Onset More than a month ago             Neuro:  Walking in PT clinic/ hallway (3 laps) with RW and cuing for posture/ step pattern/ BOS.  Discussed activity at home.  STS from blue mat table (varying heights) working on Publishing copy.  Standing tasks: fishing/ functional reaching (5 min. Limit secondary to increasing back pain)  Standing 1st step/ cone taps at stairs.    There.ex.:  Seated marching/ LAQ/ heel and toe raises (no wt.)- 20x  Nustep L4-5 10 min. B LE (MH applied to back).  Reviewed HEP     PT Long Term Goals - 01/01/21 0934      PT LONG TERM GOAL #1   Title Pt. independent with HEP to increase B hip flexion/ R quad muscle strength 1/2 muscle grade to improve pain-free mobility.    Baseline R knee extension  limited to 4/5 MMT secondary to pain/ fear of pain. 2/9: Deferred.  4/13: 4+/5 MMT (max potential).    Time 4    Period Weeks    Status Achieved      PT LONG TERM GOAL #2   Title Pt. will increase Berg balance test to >40 out of 56 to improve independence with gait/ decrease fall risk.    Baseline Berg: 22/56 (significant fall risk); 9/30 32/56.  11/10: 32 (limited by R knee pain)  2/9: 33/56 limited due to R knee and Low Back pain; 8/10: 29/56; 10/5: 38/56, 11/18/20: 28    Time 8    Period Weeks    Status Partially Met    Target Date 02/24/21      PT LONG TERM GOAL #3   Title Pt. able to ambulate 100 feet with consistent 2 point gait pattern and use of least restrictive assistive devce to improve household mobility.    Baseline Pt. able to ambulate with use of RW/ CGA to min. A for safety.   Heavy use of B UE.; 9/30 pt able to ambulate with rollator with inconsistent 2 point gait pattern, heavy reliance on UE CGA/supervision  2/9: Pt. uses 2 point step pattern with walking with rollader with decreased stride length. Pt. would be limited due to pain in the knee walking for >100 ft.; 8/10: Pt demonstrates ability to ambulate 100' with consistent 2 point gait pattern with rollator. 11/18/20: Pt able to amb 130 ft with Rollator and 2-point gait pattern    Time 8    Period Weeks    Status Partially Met    Target Date 02/24/21      PT LONG TERM GOAL #4   Title Pt. able to stand from normal chair with no UE assist to improve safety/independence with transfers.     Baseline Unable to stand from standard chair without heavy UE assist. 10/6, pt unable to stand from standard chair without UE support.  11/10: benefits from 1 UE assist    2/9: Pt. can rise from normal chair with 1 UE assist and must control descent with UE assist.; 8/10: Pt requires 2 UE for standing from standard chair height.; 10/5: Requires single UE to stand. 11/18/20: Pt requires B UE support    Time 8    Period Weeks    Status  Partially Met    Target Date 02/24/21      PT LONG TERM GOAL #5   Title Pt. will perform a sit to  stand with 1 UE support to be able to perform toileting activities with more independance.    Baseline Pt. requires 2 UE to push from to sit to stand from standard chair height. Pt. has grab rails available but can not get up by pulling.; 8/10: pt requires 2 UE's to STS from standard chair height.; 10/5: Only required single UE support to stand. Use of momentum but able to perform independently.    Time 8    Period Weeks    Status Achieved      PT LONG TERM GOAL #6   Title Pt. will ascend/ descend 4 steps with use of B handrails and step pattern with mod. independence safely to be able to enter son's house.    Baseline Step pattern with heavy UE assist and CGA/min. A for safety.; 8/10: Pt safely asc/desc 4 steps with B hand rails and step to pattern with mod indep to enter son's house safely.    Time 0    Period Weeks    Status Achieved      PT LONG TERM GOAL #7   Title Pt will be able to stand at bathroom sink for 5 min with no UE support to complete ADL's like brushing his teeth, washing his hands, and combing his hair.    Baseline 8/10: Ability to stand with no UE support for 2 min.; 10/5: 3.5 min due to LBP. Has demonstrated in previous PT sessions he can stand for 5 min.    Time 8    Period Weeks    Status Partially Met    Target Date 02/24/21      PT LONG TERM GOAL #8   Title Pt will demonstrate ability to stand from a lowered surface below 18" with single Ue support to demonstrate improved BLE strength to improve transfers from toilet.    Baseline 10/5: Able to stand from 18" chair with single UE support with use of momentum. 11/18/20: able to after 3 attempts    Time 8    Period Weeks    Status Partially Met    Target Date 02/24/21                 Plan - 01/30/21 1548    Clinical Impression Statement Tx. focus on standing balance/ tolerance without UE assist.  Pt.  completes several standing tolerance/ functional reaching tasks without assist of RW.  Marked increase in back pain with standing >5 min. requiring pt. to return to sitting quickly but controlled.  Pt. continues to ambulate with consistent but short step pattern while using heavy UE assist on RW.  No increase c/o knee pain today and PT discussed the use of knee brace.  Pts. wife states the knee brace should arrive by the end of the week and they will bring to PT next week.    Personal Factors and Comorbidities Age    Examination-Activity Limitations Toileting;Stand;Squat;Lift;Stairs    Stability/Clinical Decision Making Evolving/Moderate complexity    Clinical Decision Making Moderate    Rehab Potential Fair    PT Frequency 1x / week    PT Duration 8 weeks    PT Treatment/Interventions Moist Heat;Functional mobility training;Therapeutic activities;Therapeutic exercise;Balance training;Patient/family education;Manual techniques;Passive range of motion;Neuromuscular re-education;Stair training;Gait training;ADLs/Self Care Home Management    PT Next Visit Plan Continue to increase standing balance and stability to decrease risk of falls.  Try COMPRESSION KNEE BRACE during PT    PT Home Exercise Plan [email or web address]    Consulted and Agree with  Plan of Care Patient;Family member/caregiver    Family Member Consulted spouse: [name]           Patient will benefit from skilled therapeutic intervention in order to improve the following deficits and impairments:  Abnormal gait,Decreased endurance,Decreased activity tolerance,Pain,Decreased balance,Impaired flexibility,Decreased strength,Postural dysfunction  Visit Diagnosis: Muscle weakness (generalized)  Gait difficulty  Abnormal posture  Balance problem     Problem List Patient Active Problem List   Diagnosis Date Noted  . DVT femoral (deep venous thrombosis) with thrombophlebitis, left ([place]) 02/06/2020  . Lymphedema 04/15/2019  . Bilateral leg  edema 01/25/2019  . Hyponatremia 12/24/2018  . SIADH (syndrome of inappropriate ADH production) ([place]) 12/24/2018  . Erosion of urethra due to catheterization of urinary tract ([place]) 10/27/2018  . Generalized weakness 10/14/2018  . Hip fracture ([place]) 09/15/2018  . 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 HLD (hyperlipidemia) 03/24/2018  . Contusion of right knee 02/14/2018  . Bradycardia 12/28/2017  . 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 Tremor 09/04/2016  . Chronic pain of right knee 08/10/2016  . Right ankle pain 08/10/2016  . 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  . 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[place]) 12/11/2015  . Urinary retention 09/08/2015  . Lymphoma, 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[place]) 04/03/2015  . Breathlessness on exertion 11/21/2014  . Breath shortness 11/21/2014  . Arthropathy 11/07/2014  . Atrial flutter, paroxysmal ([place]) 11/07/2014  . Type 2 diabetes mellitus ([place]) 11/07/2014  . Benign essential tremor 11/07/2014  . Benign essential HTN 11/07/2014  . Mixed incontinence 11/07/2014  . Hypercholesterolemia without hypertriglyceridemia 11/07/2014  . Apnea, sleep 11/07/2014  . Controlled type 2 diabetes mellitus without complication ([place]) 68/37/2902  . Pure hypercholesterolemia 11/07/2014  . Other abnormalities of gait and mobility 11/02/2011  . Decreased mobility 11/02/2011  . Abnormal gait 06/29/2011  . Discoordination 06/29/2011   Pura Spice, PT, DPT # [phone] 01/30/2021, 4:00 PM  [place] [facility] [address] [place], [place], [address] Phone: [phone]   Fax:  [phone]  Name: Mike Holloway MRN: [id number] Date of Birth: 12-Jun-1941

## 2021-02-03 ENCOUNTER — Other Ambulatory Visit: Payer: Self-pay

## 2021-02-03 ENCOUNTER — Encounter: Payer: Self-pay | Admitting: Physical Therapy

## 2021-02-03 ENCOUNTER — Ambulatory Visit: Payer: Medicare Other | Admitting: Physical Therapy

## 2021-02-03 DIAGNOSIS — R2689 Other abnormalities of gait and mobility: Secondary | ICD-10-CM

## 2021-02-03 DIAGNOSIS — R293 Abnormal posture: Secondary | ICD-10-CM

## 2021-02-03 DIAGNOSIS — M6281 Muscle weakness (generalized): Secondary | ICD-10-CM

## 2021-02-03 DIAGNOSIS — R269 Unspecified abnormalities of gait and mobility: Secondary | ICD-10-CM

## 2021-02-03 NOTE — Therapy (Signed)
[facility] [address] [place], [place], [address] Phone: [phone]   Fax:  [phone]  Physical Therapy Treatment Physical Therapy Progress Note  Dates of reporting period  12/03/20 to 02/03/21   Patient Details  Name: Mike Holloway MRN: [id number] Date of Birth: 01-03-1941 Referring Provider (PT): Dr. Rudene Christians   Encounter Date: 02/03/2021   PT End of Session - 02/03/21 1306    Visit Number 108    Number of Visits 111    Date for PT Re-Evaluation 02/24/21    Authorization - Visit Number 10    Authorization - Number of Visits 10    PT Start Time 2595    PT Stop Time 1403    PT Time Calculation (min) 57 min    Equipment Utilized During Treatment Gait belt    Activity Tolerance Patient tolerated treatment well;Patient limited by pain    Behavior During Therapy [facility] for tasks assessed/performed           Past Medical History:  Diagnosis Date  . Arthritis   . Atrial flutter ([place])   . Diabetes mellitus ([place])   . Essential tremor   . Essential tremor    deep brain stimulator   . Hypercholesteremia   . Hypertension   . Incontinence   . 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[place])    grew on the testical  . SIADH (syndrome of inappropriate ADH production) ([place])   . Sleep apnea   . Stroke [facility])     Past Surgical History:  Procedure Laterality Date  . ABLATION    . APPENDECTOMY    . CATARACT EXTRACTION, BILATERAL    . COLONOSCOPY WITH PROPOFOL N/A 03/17/2018   Procedure: COLONOSCOPY WITH PROPOFOL;  Surgeon: Jonathon Bellows, MD;  Location: [facility] ENDOSCOPY;  Service: Gastroenterology;  Laterality: N/A;  . DEEP BRAIN STIMULATOR PLACEMENT    . 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 HEMORRHOID SURGERY    . HERNIA REPAIR    . HIP FRACTURE SURGERY    . INTRAMEDULLARY (IM) NAIL INTERTROCHANTERIC Right 09/16/2018    Procedure: INTRAMEDULLARY (IM) NAIL INTERTROCHANTRIC;  Surgeon: Hessie Knows, MD;  Location: [facility] ORS;  Service: Orthopedics;  Laterality: Right;  . IR CATHETER TUBE CHANGE  11/22/2018  . NASAL SINUS SURGERY    . ORCHIECTOMY    . TONSILLECTOMY      There were no vitals filed for this visit.   Subjective Assessment - 02/03/21 1306    Subjective Pt. entered PT with c/o burning/ discomfort in eyes.  Pt. has had this issue in past and uses eye drops to manage.  Pt. brought in compression knee brace (PT donned brace on pt)- good fit.    Patient is accompained by: Family member    Pertinent History Pt. states R knee is bothering him but it is the typical discomfort he experiences    Limitations House hold activities;Walking;Standing;Lifting    How long can you sit comfortably? 2 hours    How long can you stand comfortably? <10 min    How long can you walk comfortably? approx 5-10 min    Patient Stated Goals Improve LE strength/ gait and balance with daily tasks.  Improve gait/ safety/ progression to least assistive device.    Currently in Pain? Yes    Pain Score 2     Pain Location Knee    Pain Orientation Right  Pain Descriptors / Indicators Aching;Constant    Pain Onset More than a month ago    Pain Onset More than a month ago            Pt. Reports eyes are really hurting today.  Pt. Blinking a lot.     Neuro:  Walking in PT clinic/ hallway (3 laps) with RW and cuing for posture/ step pattern/ BOS.  Forward/ backwards/ lateral walking in //-bars 3x each.  Heavy UE assist.    Walking with Airex step ups/ overs in //-bars 4x.  Walking cone taps in //-bars 2 laps.  CGA for safety/ cuing to increase step pattern/ hip flexion.   No mistakes with cone taps/ extra time required.    STS from gray chair working on Publishing copy.  Standing tolerance: wt. Shifting/ shoulder flexion/ posture correction.    Seated marching/ LAQ/ heel and toe raises (no  wt.)- 20x  Nustep L4-5 10 min. B LE (MH applied to back).      PT Long Term Goals - 01/01/21 0934      PT LONG TERM GOAL #1   Title Pt. independent with HEP to increase B hip flexion/ R quad muscle strength 1/2 muscle grade to improve pain-free mobility.    Baseline R knee extension limited to 4/5 MMT secondary to pain/ fear of pain. 2/9: Deferred.  4/13: 4+/5 MMT (max potential).    Time 4    Period Weeks    Status Achieved      PT LONG TERM GOAL #2   Title Pt. will increase Berg balance test to >40 out of 56 to improve independence with gait/ decrease fall risk.    Baseline Berg: 22/56 (significant fall risk); 9/30 32/56.  11/10: 32 (limited by R knee pain)  2/9: 33/56 limited due to R knee and Low Back pain; 8/10: 29/56; 10/5: 38/56, 11/18/20: 28    Time 8    Period Weeks    Status Partially Met    Target Date 02/24/21      PT LONG TERM GOAL #3   Title Pt. able to ambulate 100 feet with consistent 2 point gait pattern and use of least restrictive assistive devce to improve household mobility.    Baseline Pt. able to ambulate with use of RW/ CGA to min. A for safety.   Heavy use of B UE.; 9/30 pt able to ambulate with rollator with inconsistent 2 point gait pattern, heavy reliance on UE CGA/supervision  2/9: Pt. uses 2 point step pattern with walking with rollader with decreased stride length. Pt. would be limited due to pain in the knee walking for >100 ft.; 8/10: Pt demonstrates ability to ambulate 100' with consistent 2 point gait pattern with rollator. 11/18/20: Pt able to amb 130 ft with Rollator and 2-point gait pattern    Time 8    Period Weeks    Status Partially Met    Target Date 02/24/21      PT LONG TERM GOAL #4   Title Pt. able to stand from normal chair with no UE assist to improve safety/independence with transfers.     Baseline Unable to stand from standard chair without heavy UE assist. 10/6, pt unable to stand from standard chair without UE support.  11/10:  benefits from 1 UE assist    2/9: Pt. can rise from normal chair with 1 UE assist and must control descent with UE assist.; 8/10: Pt requires 2 UE for standing from standard chair  height.; 10/5: Requires single UE to stand. 11/18/20: Pt requires B UE support    Time 8    Period Weeks    Status Partially Met    Target Date 02/24/21      PT LONG TERM GOAL #5   Title Pt. will perform a sit to stand with 1 UE support to be able to perform toileting activities with more independance.    Baseline Pt. requires 2 UE to push from to sit to stand from standard chair height. Pt. has grab rails available but can not get up by pulling.; 8/10: pt requires 2 UE's to STS from standard chair height.; 10/5: Only required single UE support to stand. Use of momentum but able to perform independently.    Time 8    Period Weeks    Status Achieved      PT LONG TERM GOAL #6   Title Pt. will ascend/ descend 4 steps with use of B handrails and step pattern with mod. independence safely to be able to enter son's house.    Baseline Step pattern with heavy UE assist and CGA/min. A for safety.; 8/10: Pt safely asc/desc 4 steps with B hand rails and step to pattern with mod indep to enter son's house safely.    Time 0    Period Weeks    Status Achieved      PT LONG TERM GOAL #7   Title Pt will be able to stand at bathroom sink for 5 min with no UE support to complete ADL's like brushing his teeth, washing his hands, and combing his hair.    Baseline 8/10: Ability to stand with no UE support for 2 min.; 10/5: 3.5 min due to LBP. Has demonstrated in previous PT sessions he can stand for 5 min.    Time 8    Period Weeks    Status Partially Met    Target Date 02/24/21      PT LONG TERM GOAL #8   Title Pt will demonstrate ability to stand from a lowered surface below 18" with single Ue support to demonstrate improved BLE strength to improve transfers from toilet.    Baseline 10/5: Able to stand from 18" chair with single  UE support with use of momentum. 11/18/20: able to after 3 attempts    Time 8    Period Weeks    Status Partially Met    Target Date 02/24/21                 Plan - 02/03/21 1306    Clinical Impression Statement Pt. tolerates use of compression knee brace to R knee during tx. session.  Tx. focus on standing tolerance/ gait in //-bars with progression of strengthening of LE/ cuing for upright posture.  No increase c/o R knee pain but low back discomfort reported with standing tolerance/ shoulder flexion without use of //-bars.  Pt. stands from gray chair with proper technique and mod. I due to use of arm rests/ extra time. Pt. instructed to continue to use knee brace during the day/ with walking and not use of night. See updated goals.    Personal Factors and Comorbidities Age    Examination-Activity Limitations Toileting;Stand;Squat;Lift;Stairs    Stability/Clinical Decision Making Evolving/Moderate complexity    Clinical Decision Making Moderate    Rehab Potential Fair    PT Frequency 1x / week    PT Duration 8 weeks    PT Treatment/Interventions Moist Heat;Functional mobility training;Therapeutic activities;Therapeutic exercise;Balance training;Patient/family education;Manual  techniques;Passive range of motion;Neuromuscular re-education;Stair training;Gait training;ADLs/Self Care Home Management    PT Next Visit Plan Continue to increase standing balance and stability to decrease risk of falls.  Review use of compression knee brace.    PT Home Exercise Plan [email or web address]    Consulted and Agree with Plan of Care Patient;Family member/caregiver    Family Member Consulted spouse: [name]           Patient will benefit from skilled therapeutic intervention in order to improve the following deficits and impairments:  Abnormal gait,Decreased endurance,Decreased activity tolerance,Pain,Decreased balance,Impaired flexibility,Decreased strength,Postural dysfunction  Visit Diagnosis: Muscle  weakness (generalized)  Gait difficulty  Abnormal posture  Balance problem     Problem List Patient Active Problem List   Diagnosis Date Noted  . DVT femoral (deep venous thrombosis) with thrombophlebitis, left ([place]) 02/06/2020  . Lymphedema 04/15/2019  . Bilateral leg edema 01/25/2019  . Hyponatremia 12/24/2018  . SIADH (syndrome of inappropriate ADH production) ([place]) 12/24/2018  . Erosion of urethra due to catheterization of urinary tract ([place]) 10/27/2018  . Generalized weakness 10/14/2018  . Hip fracture ([place]) 09/15/2018  . 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 HLD (hyperlipidemia) 03/24/2018  . Contusion of right knee 02/14/2018  . Bradycardia 12/28/2017  . 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 Tremor 09/04/2016  . Chronic pain of right knee 08/10/2016  . Right ankle pain 08/10/2016  . 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  . 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[place]) 12/11/2015  . Urinary retention 09/08/2015  . Lymphoma, 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[place]) 04/03/2015  . Breathlessness on exertion 11/21/2014  . Breath shortness 11/21/2014  . Arthropathy 11/07/2014  . Atrial flutter, paroxysmal ([place]) 11/07/2014  . Type 2 diabetes mellitus ([place]) 11/07/2014  . Benign essential tremor 11/07/2014  . Benign essential HTN 11/07/2014  . Mixed incontinence 11/07/2014  . Hypercholesterolemia without hypertriglyceridemia 11/07/2014  . Apnea, sleep 11/07/2014  . Controlled type 2 diabetes mellitus without complication ([place]) 66/59/9357  . Pure hypercholesterolemia 11/07/2014  . Other abnormalities of gait and mobility 11/02/2011  . Decreased mobility 11/02/2011  . Abnormal gait 06/29/2011  . Discoordination 06/29/2011   Pura Spice, PT, DPT # [phone] 02/03/2021, 6:31 PM  [place] [facility] [address] [place], [place],  [address] Phone: [phone]   Fax:  [phone]  Name: Mike Holloway MRN: [id number] Date of Birth: [birth year]

## 2021-02-10 ENCOUNTER — Other Ambulatory Visit: Payer: Self-pay

## 2021-02-10 ENCOUNTER — Ambulatory Visit: Payer: Medicare Other | Admitting: Physical Therapy

## 2021-02-10 ENCOUNTER — Encounter: Payer: Self-pay | Admitting: Physical Therapy

## 2021-02-10 DIAGNOSIS — R2689 Other abnormalities of gait and mobility: Secondary | ICD-10-CM

## 2021-02-10 DIAGNOSIS — R293 Abnormal posture: Secondary | ICD-10-CM

## 2021-02-10 DIAGNOSIS — R269 Unspecified abnormalities of gait and mobility: Secondary | ICD-10-CM

## 2021-02-10 DIAGNOSIS — M6281 Muscle weakness (generalized): Secondary | ICD-10-CM | POA: Diagnosis not present

## 2021-02-10 NOTE — Therapy (Signed)
[facility] [address] [place], [place], [address] Phone: [phone]   Fax:  [phone]  Physical Therapy Treatment  Patient Details  Name: Mike Holloway MRN: [id number] Date of Birth: 1941/06/23 Referring Provider (PT): Dr. Rudene Christians   Encounter Date: 02/10/2021   PT End of Session - 02/10/21 1316    Visit Number 109    Number of Visits 111    Date for PT Re-Evaluation 02/24/21    Authorization - Visit Number 1    Authorization - Number of Visits 10    PT Start Time 9476    PT Stop Time 1414    PT Time Calculation (min) 71 min    Equipment Utilized During Treatment Gait belt    Activity Tolerance Patient tolerated treatment well;Patient limited by pain    Behavior During Therapy [facility] for tasks assessed/performed           Past Medical History:  Diagnosis Date  . Arthritis   . Atrial flutter ([place])   . Diabetes mellitus ([place])   . Essential tremor   . Essential tremor    deep brain stimulator   . Hypercholesteremia   . Hypertension   . Incontinence   . 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[place])    grew on the testical  . SIADH (syndrome of inappropriate ADH production) ([place])   . Sleep apnea   . Stroke [facility])     Past Surgical History:  Procedure Laterality Date  . ABLATION    . APPENDECTOMY    . CATARACT EXTRACTION, BILATERAL    . COLONOSCOPY WITH PROPOFOL N/A 03/17/2018   Procedure: COLONOSCOPY WITH PROPOFOL;  Surgeon: Jonathon Bellows, MD;  Location: [facility] ENDOSCOPY;  Service: Gastroenterology;  Laterality: N/A;  . DEEP BRAIN STIMULATOR PLACEMENT    . 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 HEMORRHOID SURGERY    . HERNIA REPAIR    . HIP FRACTURE SURGERY    . INTRAMEDULLARY (IM) NAIL INTERTROCHANTERIC Right 09/16/2018   Procedure: INTRAMEDULLARY (IM) NAIL INTERTROCHANTRIC;  Surgeon: Hessie Knows, MD;   Location: [facility] ORS;  Service: Orthopedics;  Laterality: Right;  . IR CATHETER TUBE CHANGE  11/22/2018  . NASAL SINUS SURGERY    . ORCHIECTOMY    . TONSILLECTOMY      There were no vitals filed for this visit.   Subjective Assessment - 02/10/21 1315    Subjective Pt. reports no eye pain/ irritation today.  Pt. had questions about knee compression brace.    Patient is accompained by: Family member    Pertinent History Pt. states R knee is bothering him but it is the typical discomfort he experiences    Limitations House hold activities;Walking;Standing;Lifting    How long can you sit comfortably? 2 hours    How long can you stand comfortably? <10 min    How long can you walk comfortably? approx 5-10 min    Patient Stated Goals Improve LE strength/ gait and balance with daily tasks.  Improve gait/ safety/ progression to least assistive device.    Currently in Pain? Yes    Pain Score 2     Pain Location Knee    Pain Orientation Right    Pain Onset More than a month ago    Pain Onset More than a month ago            There.ex.:  5# ankle wts.  Seated marching/ LAQ/ heel raises 20x.  Standing marching/ lateral walking 3x in //-bars (heavy UE assist).    Nustep L4-5 10 min. B LE (MH applied to back).  STS from gray chair 10x.    Neuro:  Walking in PT clinic/ hallway (3 laps) with RW and cuing for posture/ step pattern/ BOS.  Forward/ backwards/ lateral walking in //-bars 3x each.  Heavy UE assist.    Walking cone taps in //-bars 2 laps.  CGA for safety/ cuing to increase step pattern/ hip flexion.   No mistakes with cone taps/ extra time required.    Standing tolerance: wt. Shifting/ shoulder flexion/ posture correction (7.5 min. Prior to c/o increase LBP).     Step ups/ down and standing stair touches/ wt. Shifting at stairs with heavy UE assist on railings.    Turning CW/CCW in //-bars with cuing to limit UE assist.        PT Long Term Goals - 01/01/21 0934       PT LONG TERM GOAL #1   Title Pt. independent with HEP to increase B hip flexion/ R quad muscle strength 1/2 muscle grade to improve pain-free mobility.    Baseline R knee extension limited to 4/5 MMT secondary to pain/ fear of pain. 2/9: Deferred.  4/13: 4+/5 MMT (max potential).    Time 4    Period Weeks    Status Achieved      PT LONG TERM GOAL #2   Title Pt. will increase Berg balance test to >40 out of 56 to improve independence with gait/ decrease fall risk.    Baseline Berg: 22/56 (significant fall risk); 9/30 32/56.  11/10: 32 (limited by R knee pain)  2/9: 33/56 limited due to R knee and Low Back pain; 8/10: 29/56; 10/5: 38/56, 11/18/20: 28    Time 8    Period Weeks    Status Partially Met    Target Date 02/24/21      PT LONG TERM GOAL #3   Title Pt. able to ambulate 100 feet with consistent 2 point gait pattern and use of least restrictive assistive devce to improve household mobility.    Baseline Pt. able to ambulate with use of RW/ CGA to min. A for safety.   Heavy use of B UE.; 9/30 pt able to ambulate with rollator with inconsistent 2 point gait pattern, heavy reliance on UE CGA/supervision  2/9: Pt. uses 2 point step pattern with walking with rollader with decreased stride length. Pt. would be limited due to pain in the knee walking for >100 ft.; 8/10: Pt demonstrates ability to ambulate 100' with consistent 2 point gait pattern with rollator. 11/18/20: Pt able to amb 130 ft with Rollator and 2-point gait pattern    Time 8    Period Weeks    Status Partially Met    Target Date 02/24/21      PT LONG TERM GOAL #4   Title Pt. able to stand from normal chair with no UE assist to improve safety/independence with transfers.     Baseline Unable to stand from standard chair without heavy UE assist. 10/6, pt unable to stand from standard chair without UE support.  11/10: benefits from 1 UE assist    2/9: Pt. can rise from normal chair with 1 UE assist and must control descent with UE  assist.; 8/10: Pt requires 2 UE for standing from standard chair height.; 10/5: Requires single UE to stand. 11/18/20: Pt requires B UE support  Time 8    Period Weeks    Status Partially Met    Target Date 02/24/21      PT LONG TERM GOAL #5   Title Pt. will perform a sit to stand with 1 UE support to be able to perform toileting activities with more independance.    Baseline Pt. requires 2 UE to push from to sit to stand from standard chair height. Pt. has grab rails available but can not get up by pulling.; 8/10: pt requires 2 UE's to STS from standard chair height.; 10/5: Only required single UE support to stand. Use of momentum but able to perform independently.    Time 8    Period Weeks    Status Achieved      PT LONG TERM GOAL #6   Title Pt. will ascend/ descend 4 steps with use of B handrails and step pattern with mod. independence safely to be able to enter son's house.    Baseline Step pattern with heavy UE assist and CGA/min. A for safety.; 8/10: Pt safely asc/desc 4 steps with B hand rails and step to pattern with mod indep to enter son's house safely.    Time 0    Period Weeks    Status Achieved      PT LONG TERM GOAL #7   Title Pt will be able to stand at bathroom sink for 5 min with no UE support to complete ADL's like brushing his teeth, washing his hands, and combing his hair.    Baseline 8/10: Ability to stand with no UE support for 2 min.; 10/5: 3.5 min due to LBP. Has demonstrated in previous PT sessions he can stand for 5 min.    Time 8    Period Weeks    Status Partially Met    Target Date 02/24/21      PT LONG TERM GOAL #8   Title Pt will demonstrate ability to stand from a lowered surface below 18" with single Ue support to demonstrate improved BLE strength to improve transfers from toilet.    Baseline 10/5: Able to stand from 18" chair with single UE support with use of momentum. 11/18/20: able to after 3 attempts    Time 8    Period Weeks    Status Partially  Met    Target Date 02/24/21              Plan - 02/10/21 1316    Clinical Impression Statement Pt. able to stand/walk in //-bars for 7.5 minutes prior to a seated rest break requested secondary to increase back/ R knee discomfort.  No LOB during tx. session during gait outside of //-bars/ outside while using RW.  Good R quad muscle activation during standing ex./ lunges with static holds, as well as step ups.  Heavy UE assist required with all aspects of walking.  Pt. unable to increase standing tolerance as tx. progresses due to pain.  Pt. enjoys use of head while in seated position/ resting between tasks.    Personal Factors and Comorbidities Age    Examination-Activity Limitations Toileting;Stand;Squat;Lift;Stairs    Stability/Clinical Decision Making Evolving/Moderate complexity    Clinical Decision Making Moderate    Rehab Potential Fair    PT Frequency 1x / week    PT Duration 8 weeks    PT Treatment/Interventions Moist Heat;Functional mobility training;Therapeutic activities;Therapeutic exercise;Balance training;Patient/family education;Manual techniques;Passive range of motion;Neuromuscular re-education;Stair training;Gait training;ADLs/Self Care Home Management    PT Next Visit Plan Continue to increase standing  balance and stability to decrease risk of falls.    PT Home Exercise Plan [email or web address]    Consulted and Agree with Plan of Care Patient;Family member/caregiver    Family Member Consulted spouse: [name]           Patient will benefit from skilled therapeutic intervention in order to improve the following deficits and impairments:  Abnormal gait,Decreased endurance,Decreased activity tolerance,Pain,Decreased balance,Impaired flexibility,Decreased strength,Postural dysfunction  Visit Diagnosis: Muscle weakness (generalized)  Gait difficulty  Abnormal posture  Balance problem     Problem List Patient Active Problem List   Diagnosis Date Noted  . DVT femoral (deep  venous thrombosis) with thrombophlebitis, left ([place]) 02/06/2020  . Lymphedema 04/15/2019  . Bilateral leg edema 01/25/2019  . Hyponatremia 12/24/2018  . SIADH (syndrome of inappropriate ADH production) ([place]) 12/24/2018  . Erosion of urethra due to catheterization of urinary tract ([place]) 10/27/2018  . Generalized weakness 10/14/2018  . Hip fracture ([place]) 09/15/2018  . 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 HLD (hyperlipidemia) 03/24/2018  . Contusion of right knee 02/14/2018  . Bradycardia 12/28/2017  . 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 Tremor 09/04/2016  . Chronic pain of right knee 08/10/2016  . Right ankle pain 08/10/2016  . 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  . 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[place]) 12/11/2015  . Urinary retention 09/08/2015  . Lymphoma, 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[place]) 04/03/2015  . Breathlessness on exertion 11/21/2014  . Breath shortness 11/21/2014  . Arthropathy 11/07/2014  . Atrial flutter, paroxysmal ([place]) 11/07/2014  . Type 2 diabetes mellitus ([place]) 11/07/2014  . Benign essential tremor 11/07/2014  . Benign essential HTN 11/07/2014  . Mixed incontinence 11/07/2014  . Hypercholesterolemia without hypertriglyceridemia 11/07/2014  . Apnea, sleep 11/07/2014  . Controlled type 2 diabetes mellitus without complication ([place]) 16/60/6004  . Pure hypercholesterolemia 11/07/2014  . Other abnormalities of gait and mobility 11/02/2011  . Decreased mobility 11/02/2011  . Abnormal gait 06/29/2011  . Discoordination 06/29/2011   Pura Spice, PT, DPT # [phone] 02/11/2021, 2:15 PM  [place] [address] [place], [place], [address] Phone: [phone]   Fax:  [phone]  Name: Mike Holloway MRN: [id number] Date of Birth: 13-Oct-1940

## 2021-02-11 ENCOUNTER — Ambulatory Visit (INDEPENDENT_AMBULATORY_CARE_PROVIDER_SITE_OTHER): Payer: Medicare Other | Admitting: Physician Assistant

## 2021-02-11 DIAGNOSIS — R339 Retention of urine, unspecified: Secondary | ICD-10-CM

## 2021-02-11 NOTE — Progress Notes (Signed)
Cath Change/ Replacement  Patient is present today for a catheter change due to urinary retention.  81ml of water was removed from the balloon, a 16FR coude foley cath was removed without difficulty.  Patient was cleaned and prepped in a sterile fashion with betadine and 2% lidocaine jelly was instilled into the urethra. A 16 FR coude foley cath was replaced into the bladder no complications were noted Urine return was noted 31ml and urine was yellow in color. The balloon was filled with 25ml of sterile water. A leg bag was attached for drainage.  A night bag was also given to the patient.   Performed by: Debroah Loop, PA-C   Follow up: Return in about 4 weeks (around 03/11/2021) for Catheter exchange.

## 2021-02-17 ENCOUNTER — Ambulatory Visit: Payer: Medicare Other | Admitting: Physical Therapy

## 2021-02-24 ENCOUNTER — Encounter: Payer: Self-pay | Admitting: Physical Therapy

## 2021-02-24 ENCOUNTER — Ambulatory Visit: Payer: Medicare Other | Admitting: Physical Therapy

## 2021-02-24 ENCOUNTER — Other Ambulatory Visit: Payer: Self-pay

## 2021-02-24 DIAGNOSIS — R293 Abnormal posture: Secondary | ICD-10-CM

## 2021-02-24 DIAGNOSIS — R269 Unspecified abnormalities of gait and mobility: Secondary | ICD-10-CM

## 2021-02-24 DIAGNOSIS — R2689 Other abnormalities of gait and mobility: Secondary | ICD-10-CM

## 2021-02-24 DIAGNOSIS — M6281 Muscle weakness (generalized): Secondary | ICD-10-CM | POA: Diagnosis not present

## 2021-02-24 NOTE — Therapy (Signed)
[facility] [address] [place], [place], [address] Phone: [phone]   Fax:  [phone]  Physical Therapy Treatment  Patient Details  Name: Mike Holloway MRN: [id number] Date of Birth: 1941-04-26 Referring Provider (PT): Dr. Rudene Christians   Encounter Date: 02/24/2021   PT End of Session - 02/24/21 1315    Visit Number 110    Number of Visits 111    Date for PT Re-Evaluation 02/24/21    Authorization - Visit Number 2    Authorization - Number of Visits 10    PT Start Time 5638    PT Stop Time 1404    PT Time Calculation (min) 59 min    Equipment Utilized During Treatment Gait belt    Activity Tolerance Patient tolerated treatment well;Patient limited by pain    Behavior During Therapy [facility] for tasks assessed/performed           Past Medical History:  Diagnosis Date  . Arthritis   . Atrial flutter ([place])   . Diabetes mellitus ([place])   . Essential tremor   . Essential tremor    deep brain stimulator   . Hypercholesteremia   . Hypertension   . Incontinence   . 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 SIADH (syndrome of inappropriate ADH production) ([place])   . Sleep apnea   . Stroke [facility])     Past Surgical History:  Procedure Laterality Date  . ABLATION    . APPENDECTOMY    . CATARACT EXTRACTION, BILATERAL    . COLONOSCOPY WITH PROPOFOL N/A 03/17/2018   Procedure: COLONOSCOPY WITH PROPOFOL;  Surgeon: Jonathon Bellows, MD;  Location: [facility] ENDOSCOPY;  Service: Gastroenterology;  Laterality: N/A;  . DEEP BRAIN STIMULATOR PLACEMENT    . 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 HEMORRHOID SURGERY    . HERNIA REPAIR    . HIP FRACTURE SURGERY    . INTRAMEDULLARY (IM) NAIL INTERTROCHANTERIC Right 09/16/2018   Procedure: INTRAMEDULLARY (IM) NAIL INTERTROCHANTRIC;  Surgeon: Hessie Knows, MD;   Location: [facility] ORS;  Service: Orthopedics;  Laterality: Right;  . IR CATHETER TUBE CHANGE  11/22/2018  . NASAL SINUS SURGERY    . ORCHIECTOMY    . TONSILLECTOMY      There were no vitals filed for this visit.   Subjective Assessment - 02/24/21 1313    Subjective Pt. reports wife was in hospital for GI issues and he was home with family.  Pt. was sick last Tuesday and missed PT appt.  Pt. reports being less active last week.    Patient is accompained by: Family member    Pertinent History Pt. states R knee is bothering him but it is the typical discomfort he experiences    Limitations House hold activities;Walking;Standing;Lifting    How long can you sit comfortably? 2 hours    How long can you stand comfortably? <10 min    How long can you walk comfortably? approx 5-10 min    Patient Stated Goals Improve LE strength/ gait and balance with daily tasks.  Improve gait/ safety/ progression to least assistive device.    Currently in Pain? Yes    Pain Score 2     Pain Location Knee    Pain Orientation Right    Pain Onset More than a month ago    Pain Score 2  Pain Location Back    Pain Orientation Lower;Right;Left    Pain Onset More than a month ago            There.ex.:  4# ankle wts. Seated marching/ LAQ/ heel raises 20x.  Standing marching/ lateral walking 3x in //-bars (heavy UE assist).      Reviewed HEP/ discussed walking program and stair climbing at son's house.   Nustep L4-5 10 min. B LE (MH applied to back).    Neuro:  4# ankle wt. Walking over hurdles/ 6" step taps.   Step ups/ down at 1st step 10x.  Ascending stairs with recip. Pattern and step to pattern with descending.  Heavy UE assist on handrails but safe/ controlled movement pattern.   Marked LE fatigue/ increase low back discomfort with increase standing tolerance.      Turning CW/CCW in //-bars with cuing to limit UE assist.   Standing wt. Shifting to L/R and forward with //-bar assist.  Cuing  for posture correction.       PT Long Term Goals - 01/01/21 0934      PT LONG TERM GOAL #1   Title Pt. independent with HEP to increase B hip flexion/ R quad muscle strength 1/2 muscle grade to improve pain-free mobility.    Baseline R knee extension limited to 4/5 MMT secondary to pain/ fear of pain. 2/9: Deferred.  4/13: 4+/5 MMT (max potential).    Time 4    Period Weeks    Status Achieved      PT LONG TERM GOAL #2   Title Pt. will increase Berg balance test to >40 out of 56 to improve independence with gait/ decrease fall risk.    Baseline Berg: 22/56 (significant fall risk); 9/30 32/56.  11/10: 32 (limited by R knee pain)  2/9: 33/56 limited due to R knee and Low Back pain; 8/10: 29/56; 10/5: 38/56, 11/18/20: 28    Time 8    Period Weeks    Status Partially Met    Target Date 02/24/21      PT LONG TERM GOAL #3   Title Pt. able to ambulate 100 feet with consistent 2 point gait pattern and use of least restrictive assistive devce to improve household mobility.    Baseline Pt. able to ambulate with use of RW/ CGA to min. A for safety.   Heavy use of B UE.; 9/30 pt able to ambulate with rollator with inconsistent 2 point gait pattern, heavy reliance on UE CGA/supervision  2/9: Pt. uses 2 point step pattern with walking with rollader with decreased stride length. Pt. would be limited due to pain in the knee walking for >100 ft.; 8/10: Pt demonstrates ability to ambulate 100' with consistent 2 point gait pattern with rollator. 11/18/20: Pt able to amb 130 ft with Rollator and 2-point gait pattern    Time 8    Period Weeks    Status Partially Met    Target Date 02/24/21      PT LONG TERM GOAL #4   Title Pt. able to stand from normal chair with no UE assist to improve safety/independence with transfers.     Baseline Unable to stand from standard chair without heavy UE assist. 10/6, pt unable to stand from standard chair without UE support.  11/10: benefits from 1 UE assist    2/9: Pt. can  rise from normal chair with 1 UE assist and must control descent with UE assist.; 8/10: Pt requires 2 UE for standing from  standard chair height.; 10/5: Requires single UE to stand. 11/18/20: Pt requires B UE support    Time 8    Period Weeks    Status Partially Met    Target Date 02/24/21      PT LONG TERM GOAL #5   Title Pt. will perform a sit to stand with 1 UE support to be able to perform toileting activities with more independance.    Baseline Pt. requires 2 UE to push from to sit to stand from standard chair height. Pt. has grab rails available but can not get up by pulling.; 8/10: pt requires 2 UE's to STS from standard chair height.; 10/5: Only required single UE support to stand. Use of momentum but able to perform independently.    Time 8    Period Weeks    Status Achieved      PT LONG TERM GOAL #6   Title Pt. will ascend/ descend 4 steps with use of B handrails and step pattern with mod. independence safely to be able to enter son's house.    Baseline Step pattern with heavy UE assist and CGA/min. A for safety.; 8/10: Pt safely asc/desc 4 steps with B hand rails and step to pattern with mod indep to enter son's house safely.    Time 0    Period Weeks    Status Achieved      PT LONG TERM GOAL #7   Title Pt will be able to stand at bathroom sink for 5 min with no UE support to complete ADL's like brushing his teeth, washing his hands, and combing his hair.    Baseline 8/10: Ability to stand with no UE support for 2 min.; 10/5: 3.5 min due to LBP. Has demonstrated in previous PT sessions he can stand for 5 min.    Time 8    Period Weeks    Status Partially Met    Target Date 02/24/21      PT LONG TERM GOAL #8   Title Pt will demonstrate ability to stand from a lowered surface below 18" with single Ue support to demonstrate improved BLE strength to improve transfers from toilet.    Baseline 10/5: Able to stand from 18" chair with single UE support with use of momentum. 11/18/20:  able to after 3 attempts    Time 8    Period Weeks    Status Partially Met    Target Date 02/24/21                 Plan - 02/24/21 1316    Clinical Impression Statement Pt. able to ascend/ descend 4 steps x 4 with handrail assist and recip. pattern with ascending/ step to pattern with descending.  Pt. limited with increase low back/ R knee pain after 8 laps in //-bars and pain resolves quickly with short seated rest break.  PT cued pt. to progress between ther.ex. with decrease seated rest breaks, esp. after Nustep at end of tx.  Pt. normally requires an increase rest break prior to walking out to car.  Pt. was able to stand from Nustep and walking to car at side of building while maneuvering ramp with SBA.  Pt. managed car handle/ door independently but requires assist to get rollator back into car.    Personal Factors and Comorbidities Age    Examination-Activity Limitations Toileting;Stand;Squat;Lift;Stairs    Stability/Clinical Decision Making Evolving/Moderate complexity    Clinical Decision Making Moderate    Rehab Potential Fair  PT Frequency 1x / week    PT Duration 8 weeks    PT Treatment/Interventions Moist Heat;Functional mobility training;Therapeutic activities;Therapeutic exercise;Balance training;Patient/family education;Manual techniques;Passive range of motion;Neuromuscular re-education;Stair training;Gait training;ADLs/Self Care Home Management    PT Next Visit Plan Continue to increase standing balance and stability to decrease risk of falls.  DISCUSS Neuro appt. (scheduled for 02/26/21).  RECERT next tx. session.    PT Home Exercise Plan [id number]    Consulted and Agree with Plan of Care Patient;Family member/caregiver    Family Member Consulted spouse: [name]           Patient will benefit from skilled therapeutic intervention in order to improve the following deficits and impairments:  Abnormal gait,Decreased endurance,Decreased activity tolerance,Pain,Decreased  balance,Impaired flexibility,Decreased strength,Postural dysfunction  Visit Diagnosis: Muscle weakness (generalized)  Gait difficulty  Abnormal posture  Balance problem     Problem List Patient Active Problem List   Diagnosis Date Noted  . DVT femoral (deep venous thrombosis) with thrombophlebitis, left ([place]) 02/06/2020  . Lymphedema 04/15/2019  . Bilateral leg edema 01/25/2019  . Hyponatremia 12/24/2018  . SIADH (syndrome of inappropriate ADH production) ([place]) 12/24/2018  . Erosion of urethra due to catheterization of urinary tract ([place]) 10/27/2018  . Generalized weakness 10/14/2018  . Hip fracture ([place]) 09/15/2018  . 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 HLD (hyperlipidemia) 03/24/2018  . Contusion of right knee 02/14/2018  . Bradycardia 12/28/2017  . 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 Tremor 09/04/2016  . Chronic pain of right knee 08/10/2016  . Right ankle pain 08/10/2016  . 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  . 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[place]) 12/11/2015  . Urinary retention 09/08/2015  . Lymphoma, 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[place]) 04/03/2015  . Breathlessness on exertion 11/21/2014  . Breath shortness 11/21/2014  . Arthropathy 11/07/2014  . Atrial flutter, paroxysmal ([place]) 11/07/2014  . Type 2 diabetes mellitus ([place]) 11/07/2014  . Benign essential tremor 11/07/2014  . Benign essential HTN 11/07/2014  . Mixed incontinence 11/07/2014  . Hypercholesterolemia without hypertriglyceridemia 11/07/2014  . Apnea, sleep 11/07/2014  . Controlled type 2 diabetes mellitus without complication ([place]) 38/18/2993  . Pure hypercholesterolemia 11/07/2014  . Other abnormalities of gait and mobility 11/02/2011  . Decreased mobility 11/02/2011  . Abnormal gait 06/29/2011  . Discoordination 06/29/2011   Pura Spice, PT, DPT # [id number] 02/24/2021, 7:00 PM  [facility] [address] [place], [place], [address] Phone: [phone]   Fax:  [phone]  Name: Reggie Bise MRN: [id number] Date of Birth: Mar 10, 1941

## 2021-03-03 ENCOUNTER — Ambulatory Visit: Payer: Medicare Other | Attending: Orthopedic Surgery | Admitting: Physical Therapy

## 2021-03-03 ENCOUNTER — Encounter: Payer: Self-pay | Admitting: Physical Therapy

## 2021-03-03 ENCOUNTER — Other Ambulatory Visit: Payer: Self-pay

## 2021-03-03 DIAGNOSIS — M6281 Muscle weakness (generalized): Secondary | ICD-10-CM

## 2021-03-03 DIAGNOSIS — R269 Unspecified abnormalities of gait and mobility: Secondary | ICD-10-CM | POA: Insufficient documentation

## 2021-03-03 DIAGNOSIS — R293 Abnormal posture: Secondary | ICD-10-CM | POA: Insufficient documentation

## 2021-03-03 DIAGNOSIS — R2689 Other abnormalities of gait and mobility: Secondary | ICD-10-CM | POA: Diagnosis present

## 2021-03-03 NOTE — Therapy (Signed)
[facility] [address] [place], [place], [address] Phone: [phone]   Fax:  [phone]  Physical Therapy Treatment  Patient Details  Name: Mike Holloway MRN: [id number] Date of Birth: [birth year] Referring Provider (PT): Dr. Rudene Christians   Encounter Date: 03/03/2021      PT End of Session - 03/03/21 1413     Visit Number 111    Number of Visits 119    Date for PT Re-Evaluation 04/28/21    Authorization - Visit Number 2    Authorization - Number of Visits 10    PT Start Time 1301    PT Stop Time 1402    PT Time Calculation (min) 61 min    Equipment Utilized During Treatment Gait belt    Activity Tolerance Patient tolerated treatment well;Patient limited by pain    Behavior During Therapy WFL for tasks assessed/performed             Past Medical History:  Diagnosis Date   Arthritis    Atrial flutter ([place])    Diabetes mellitus ([place])    Essential tremor    Essential tremor    deep brain stimulator    Hypercholesteremia    Hypertension    Incontinence    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[place])    grew on the testical   SIADH (syndrome of inappropriate ADH production) ([place])    Sleep apnea    Stroke [facility])     Past Surgical History:  Procedure Laterality Date   ABLATION     APPENDECTOMY     CATARACT EXTRACTION, BILATERAL     COLONOSCOPY WITH PROPOFOL N/A 03/17/2018   Procedure: COLONOSCOPY WITH PROPOFOL;  Surgeon: Jonathon Bellows, MD;  Location: [facility] 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   Subjective Assessment - 03/03/21 1412     Subjective Pt. states his R knee buckled yesterday while walking in kitchen.  Pt. reports no fall but increase fear of R knee pain/ fall with walking.  Pt. reports 2-3/10 R knee pain.    Patient is accompained by: Family member    Pertinent History Pt. states R knee is bothering him but it is the typical discomfort he experiences    Limitations House hold activities;Walking;Standing;Lifting    How long can you sit comfortably? 2 hours    How long can you stand comfortably? <10 min    How long can you walk comfortably? approx 5-10 min    Patient Stated Goals Improve LE strength/ gait and balance with daily tasks.  Improve gait/ safety/ progression to least assistive device.    Currently in Pain? Yes    Pain Score 3     Pain Location Knee    Pain Orientation Right    Pain Descriptors / Indicators Aching;Constant  Pain Onset More than a month ago    Pain Onset More than a month ago                There.ex.:   4# ankle wts. Seated marching/ LAQ/ heel raises 20x.  Standing marching/ lateral walking 3x in //-bars (heavy UE assist).      Standing hip extension with 4# wt. 20x (limited by R knee pain).    Reviewed HEP   Nustep L5 10 min. B LE (MH applied to back).       Neuro:   Walking over hurdles in //-bars to focus hip flexion/ step pattern 2 laps (CGA for safety/ cuing)    Ascending stairs with recip. Pattern and step to pattern with descending.  Heavy UE assist on handrails but safe/ controlled movement pattern.    Standing tolerance: fishing (4 min.x 2 with seated rest break required).       Standing wt. Shifting to L/R and forward with //-bar assist.  Cuing for posture correction.      PT Long Term Goals - 03/06/21 1532        PT LONG TERM GOAL #1   Title Pt. independent with HEP to increase B hip flexion/ R quad muscle strength 1/2 muscle grade to improve pain-free mobility.    Baseline R knee extension limited to 4/5 MMT secondary to pain/ fear of pain. 2/9: Deferred.  4/13: 4+/5 MMT (max potential).    Time 4    Period Weeks    Status Achieved      PT LONG TERM GOAL #2   Title Pt. will increase Berg balance test to >40 out of 56 to improve independence with gait/ decrease fall risk.    Baseline Berg: 22/56 (significant fall risk); 9/30 32/56.  11/10: 32 (limited by R knee pain)  2/9: 33/56 limited due to R knee and Low Back pain; 8/10: 29/56; 10/5: 38/56, 11/18/20: 28    Time 8    Period Weeks    Status Partially Met    Target Date 04/28/21      PT LONG TERM GOAL #3   Title Pt. able to ambulate 100 feet with consistent 2 point gait pattern and use of least restrictive assistive devce to improve household mobility.    Baseline Pt. able to ambulate with use of RW/ CGA to min. A for safety.   Heavy use of B UE.; 9/30 pt able to ambulate with rollator with inconsistent 2 point gait pattern, heavy reliance on UE CGA/supervision  2/9: Pt. uses 2 point step pattern with walking with rollader with decreased stride length. Pt. would be limited due to pain in the knee walking for >100 ft.; 8/10: Pt demonstrates ability to ambulate 100' with consistent 2 point gait pattern with rollator. 11/18/20: Pt able to amb 130 ft with Rollator and 2-point gait pattern    Time 8    Period Weeks    Status Partially Met    Target Date 04/28/21      PT LONG TERM GOAL #4   Title Pt. able to stand from normal chair with no UE assist to improve safety/independence with transfers.     Baseline Unable to stand from standard chair without heavy UE assist. 10/6, pt unable to stand from standard chair without UE support.  11/10: benefits from 1 UE assist    2/9: Pt. can rise from normal chair with 1 UE assist and must control descent with UE  assist.; 8/10: Pt requires 2  UE for standing from standard chair height.; 10/5: Requires single UE to stand. 11/18/20: Pt requires B UE support    Time 8    Period Weeks    Status Partially Met    Target Date 04/28/21      PT LONG TERM GOAL #5   Title Pt. will perform a sit to stand with 1 UE support to be able to perform toileting activities with more independance.    Baseline Pt. requires 2 UE to push from to sit to stand from standard chair height. Pt. has grab rails available but can not get up by pulling.; 8/10: pt requires 2 UE's to STS from standard chair height.; 10/5: Only required single UE support to stand. Use of momentum but able to perform independently.    Time 8    Period Weeks    Status Achieved      PT LONG TERM GOAL #6   Title Pt. will ascend/ descend 4 steps with use of B handrails and step pattern with mod. independence safely to be able to enter son's house.    Baseline Step pattern with heavy UE assist and CGA/min. A for safety.; 8/10: Pt safely asc/desc 4 steps with B hand rails and step to pattern with mod indep to enter son's house safely.    Time 0    Period Weeks    Status Achieved      PT LONG TERM GOAL #7   Title Pt will be able to stand at bathroom sink for 5 min with no UE support to complete ADL's like brushing his teeth, washing his hands, and combing his hair.    Baseline 8/10: Ability to stand with no UE support for 2 min.; 10/5: 3.5 min due to LBP. Has demonstrated in previous PT sessions he can stand for 5 min.    Time 8    Period Weeks    Status Partially Met    Target Date 04/28/21      PT LONG TERM GOAL #8   Title Pt will demonstrate ability to stand from a lowered surface below 18" with single Ue support to demonstrate improved BLE strength to improve transfers from toilet.    Baseline 10/5: Able to stand from 18" chair with single UE support with use of momentum. 11/18/20: able to after 3 attempts    Time 8    Period Weeks    Status Partially  Met    Target Date 04/28/21             Pt. limited by increase R knee pain prior to PT tx. session. Pt. standing tolerance less than 5 min. max. standing endurance without UE assist due to R knee/ low back pain. Pts. pain worsens t/o tx. with sit to stands and walking around PT clinic with use of rollator. Pt. able to ascend/ descend stairs with heavy UE assist and focus on consistent hip/knee flexion/ foot placement. B shoulder flexion/ abduction limited during standing ther.ex. and challenges pts. center of gravity/ balance. No episodes of R knee buckling during tx. and pt. instructed to continue with current HEP and increase walking distance/ endurance at home with assist of family. Pt. will continue to benefit from skilled PT services to increase/ maintain B LE muscle strength to improve walking/ balance with daily tasks.       Patient will benefit from skilled therapeutic intervention in order to improve the following deficits and impairments:  Abnormal gait, Decreased endurance, Decreased activity tolerance, Pain, Decreased  balance, Impaired flexibility, Decreased strength, Postural dysfunction  Visit Diagnosis: Muscle weakness (generalized)  Gait difficulty  Abnormal posture  Balance problem     Problem List Patient Active Problem List   Diagnosis Date Noted   DVT femoral (deep venous thrombosis) with thrombophlebitis, left ([facility]) 02/06/2020   Lymphedema 04/15/2019   Bilateral leg edema 01/25/2019   Hyponatremia 12/24/2018   SIADH (syndrome of inappropriate ADH production) ([place]) 12/24/2018   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) 10/27/2018   Generalized weakness 10/14/2018   Hip fracture ([place]) 09/15/2018   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   HLD (hyperlipidemia) 03/24/2018   Contusion of right knee 02/14/2018   Bradycardia 12/28/2017    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   Tremor 09/04/2016   Chronic pain of right knee 08/10/2016   Right ankle pain 08/10/2016   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[place]) 12/11/2015   Urinary retention 09/08/2015   Lymphoma, 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[place]) 04/03/2015   Breathlessness on exertion 11/21/2014   Breath shortness 11/21/2014   Arthropathy 11/07/2014   Atrial flutter, paroxysmal ([place]) 11/07/2014   Type 2 diabetes mellitus ([place]) 11/07/2014   Benign essential tremor 11/07/2014   Benign essential HTN 11/07/2014   Mixed incontinence 11/07/2014   Hypercholesterolemia without hypertriglyceridemia 11/07/2014   Apnea, sleep 11/07/2014   Controlled type 2 diabetes mellitus without complication ([place]) 29/92/4268   Pure hypercholesterolemia 11/07/2014   Other abnormalities of gait and mobility 11/02/2011   Decreased mobility 11/02/2011   Abnormal gait 06/29/2011   Discoordination 06/29/2011   Pura Spice, PT, DPT # [phone] 03/06/2021, 3:35 PM   [facility] [address] [place], [place], [address] Phone: [phone]   Fax:  [phone]  Name: Leonell Lobdell MRN: [id number] Date of Birth: 1941-05-17

## 2021-03-10 ENCOUNTER — Ambulatory Visit
Admission: EM | Admit: 2021-03-10 | Discharge: 2021-03-10 | Disposition: A | Discharge status: Home / Self Care | Payer: Medicare Other | Attending: Emergency Medicine | Admitting: Emergency Medicine

## 2021-03-10 ENCOUNTER — Encounter: Payer: Self-pay | Admitting: Emergency Medicine

## 2021-03-10 ENCOUNTER — Other Ambulatory Visit: Payer: Self-pay

## 2021-03-10 ENCOUNTER — Encounter: Payer: Medicare Other | Admitting: Physical Therapy

## 2021-03-10 DIAGNOSIS — Z880 Allergy status to penicillin: Secondary | ICD-10-CM | POA: Diagnosis not present

## 2021-03-10 DIAGNOSIS — N390 Urinary tract infection, site not specified: Secondary | ICD-10-CM | POA: Diagnosis not present

## 2021-03-10 DIAGNOSIS — G4730 Sleep apnea, unspecified: Secondary | ICD-10-CM | POA: Insufficient documentation

## 2021-03-10 DIAGNOSIS — I10 Essential (primary) hypertension: Secondary | ICD-10-CM | POA: Diagnosis not present

## 2021-03-10 DIAGNOSIS — Z7982 Long term (current) use of aspirin: Secondary | ICD-10-CM | POA: Insufficient documentation

## 2021-03-10 DIAGNOSIS — R202 Paresthesia of skin: Secondary | ICD-10-CM | POA: Diagnosis present

## 2021-03-10 DIAGNOSIS — T83511A Infection and inflammatory reaction due to indwelling urethral catheter, initial encounter: Secondary | ICD-10-CM | POA: Insufficient documentation

## 2021-03-10 DIAGNOSIS — Z20822 Contact with and (suspected) exposure to covid-19: Secondary | ICD-10-CM | POA: Diagnosis not present

## 2021-03-10 DIAGNOSIS — G250 Essential tremor: Secondary | ICD-10-CM | POA: Diagnosis not present

## 2021-03-10 DIAGNOSIS — Z882 Allergy status to sulfonamides status: Secondary | ICD-10-CM | POA: Insufficient documentation

## 2021-03-10 DIAGNOSIS — Z79899 Other long term (current) drug therapy: Secondary | ICD-10-CM | POA: Diagnosis not present

## 2021-03-10 DIAGNOSIS — Z7901 Long term (current) use of anticoagulants: Secondary | ICD-10-CM | POA: Insufficient documentation

## 2021-03-10 LAB — URINALYSIS, COMPLETE (UACMP) WITH MICROSCOPIC
Bilirubin Urine: NEGATIVE
Glucose, UA: 100 mg/dL — AB
Ketones, ur: NEGATIVE mg/dL
Nitrite: POSITIVE — AB
Protein, ur: 30 mg/dL — AB
Specific Gravity, Urine: 1.02 (ref 1.005–1.030)
pH: 7 (ref 5.0–8.0)

## 2021-03-10 LAB — COMPREHENSIVE METABOLIC PANEL
ALT: 16 U/L (ref 0–44)
AST: 21 U/L (ref 15–41)
Albumin: 3.5 g/dL (ref 3.5–5.0)
Alkaline Phosphatase: 78 U/L (ref 38–126)
Anion gap: 4 — ABNORMAL LOW (ref 5–15)
BUN: 19 mg/dL (ref 8–23)
CO2: 23 mmol/L (ref 22–32)
Calcium: 9.1 mg/dL (ref 8.9–10.3)
Chloride: 101 mmol/L (ref 98–111)
Creatinine, Ser: 1.2 mg/dL (ref 0.61–1.24)
GFR, Estimated: >60 mL/min (ref >60)
Glucose, Bld: 137 mg/dL — ABNORMAL HIGH (ref 70–99)
Potassium: 4.6 mmol/L (ref 3.5–5.1)
Sodium: 128 mmol/L — ABNORMAL LOW (ref 135–145)
Total Bilirubin: 0.6 mg/dL (ref 0.3–1.2)
Total Protein: 7.5 g/dL (ref 6.5–8.1)

## 2021-03-10 LAB — CBC WITH DIFFERENTIAL/PLATELET
Abs Immature Granulocytes: 0.08 10*3/uL — ABNORMAL HIGH (ref 0.00–0.07)
Basophils Absolute: 0 10*3/uL (ref 0.0–0.1)
Basophils Relative: 1 %
Eosinophils Absolute: 0 10*3/uL (ref 0.0–0.5)
Eosinophils Relative: 1 %
HCT: 39.8 % (ref 39.0–52.0)
Hemoglobin: 13.8 g/dL (ref 13.0–17.0)
Immature Granulocytes: 1 %
Lymphocytes Relative: 25 %
Lymphs Abs: 1.5 10*3/uL (ref 0.7–4.0)
MCH: 32.9 pg (ref 26.0–34.0)
MCHC: 34.7 g/dL (ref 30.0–36.0)
MCV: 94.8 fL (ref 80.0–100.0)
Monocytes Absolute: 0.4 10*3/uL (ref 0.1–1.0)
Monocytes Relative: 7 %
Neutro Abs: 3.8 10*3/uL (ref 1.7–7.7)
Neutrophils Relative %: 65 %
Platelets: 102 10*3/uL — ABNORMAL LOW (ref 150–400)
RBC: 4.2 MIL/uL — ABNORMAL LOW (ref 4.22–5.81)
RDW: 14.7 % (ref 11.5–15.5)
WBC: 5.9 10*3/uL (ref 4.0–10.5)
nRBC: 0 % (ref 0.0–0.2)

## 2021-03-10 LAB — RESP PANEL BY RT-PCR (FLU A&B, COVID) ARPGX2
Influenza A by PCR: NEGATIVE
Influenza B by PCR: NEGATIVE
SARS Coronavirus 2 by RT PCR: NEGATIVE

## 2021-03-10 MED ORDER — CEPHALEXIN 500 MG PO CAPS: 500.0000 mg | ORAL_CAPSULE | Freq: Two times a day (BID) | ORAL | 0 refills | Status: DC

## 2021-03-10 NOTE — ED Provider Notes (Signed)
[facility]    CSN: [id number] Arrival date & time: 03/10/21  1207      History   Chief Complaint Chief Complaint  Patient presents with   Hypertension   Tingling    HPI Mike Holloway is a 80 y.o. male.   HPI  80 year old male here for evaluation of elevated blood pressure and feeling tingly all over.  Patient is here with his wife who brought him in for evaluation because this morning he reported that he felt tingly and felt "off".  They used a home wrist cuff that is not new to check her blood pressure and the reading was 189/106.  The home health nurse checked it and it was 149/88.  He reports that he had sweats and felt tingly this morning.  Those symptoms have improved but they did return while he was in her waiting room.  Wife reports that he was very restless last night, more so than usual and he does use a CPAP for sleep apnea.  He also has an indwelling Foley and is due for catheter change tomorrow.  He is currently wearing a leg bag that has been in place for the past week and a half with a golden yellow urine in it.  Patient has not had a fever, chest pain or shortness of breath, palpitations, no bloody or cloudy urine, headache, visual changes, or dizziness.  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   SIADH (syndrome of inappropriate ADH production) ([place])    Sleep apnea    Stroke [facility])     Patient Active Problem List   Diagnosis Date Noted   DVT femoral (deep venous thrombosis) with thrombophlebitis, left ([place]) 02/06/2020   Lymphedema 04/15/2019   Bilateral leg edema 01/25/2019   Hyponatremia 12/24/2018   SIADH (syndrome of inappropriate ADH  production) ([place]) 12/24/2018   Erosion of urethra due to catheterization of urinary tract ([place]) 10/27/2018   Generalized weakness 10/14/2018   Hip fracture ([place]) 09/15/2018   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   HLD (hyperlipidemia) 03/24/2018   Contusion of right knee 02/14/2018   Bradycardia 12/28/2017   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   Tremor 09/04/2016   Chronic pain of right knee 08/10/2016   Right ankle pain 08/10/2016   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[place]) 12/11/2015   Urinary retention 09/08/2015   Lymphoma, 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[place]) 04/03/2015   Breathlessness on exertion 11/21/2014   Breath shortness 11/21/2014   Arthropathy 11/07/2014   Atrial flutter, paroxysmal ([place]) 11/07/2014   Type 2 diabetes mellitus ([place]) 11/07/2014   Benign essential tremor 11/07/2014   Benign essential HTN 11/07/2014   Mixed incontinence 11/07/2014   Hypercholesterolemia without hypertriglyceridemia 11/07/2014   Apnea, sleep 11/07/2014   Controlled type 2 diabetes mellitus without complication ([place]) 07/21/8526   Pure hypercholesterolemia 11/07/2014   Other abnormalities of gait and mobility 11/02/2011   Decreased mobility 11/02/2011   Abnormal gait 06/29/2011   Discoordination 06/29/2011    Past Surgical History:  Procedure Laterality Date  ABLATION     APPENDECTOMY     CATARACT EXTRACTION, BILATERAL     COLONOSCOPY WITH PROPOFOL N/A 03/17/2018   Procedure: COLONOSCOPY WITH PROPOFOL;  Surgeon: Jonathon Bellows, MD;  Location[facility] 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   Procedure: FECAL TRANSPLANT;  Surgeon: Jonathon Bellows, MD;  Location: [facility] ENDOSCOPY;  Service: Gastroenterology;  Laterality: N/A;   HEMORRHOID SURGERY     HERNIA REPAIR     HIP  FRACTURE SURGERY     INTRAMEDULLARY (IM) NAIL INTERTROCHANTERIC Right 09/16/2018   Procedure: INTRAMEDULLARY (IM) NAIL INTERTROCHANTRIC;  Surgeon: Hessie Knows, MD;  Location: [facility] ORS;  Service: Orthopedics;  Laterality: Right;   IR CATHETER TUBE CHANGE  11/22/2018   NASAL SINUS SURGERY     ORCHIECTOMY     TONSILLECTOMY         Home Medications    Prior to Admission medications   Medication Sig Start Date End Date Taking? Authorizing Provider  cephALEXin (KEFLEX) 500 MG capsule Take 1 capsule (500 mg total) by mouth 2 (two) times daily for 5 days. 03/10/21 03/15/21 Yes Margarette Canada, NP  acetaminophen (TYLENOL) 650 MG CR tablet Take 650 mg by mouth every 8 (eight) hours as needed for pain.    [provider]  aspirin 325 MG tablet Take 325 mg by mouth daily.    [provider]  Azelastine HCl 0.15 % SOLN U 1 SPRAY IEN BID 03/15/19   [provider]  cholecalciferol (VITAMIN D) 25 MCG (1000 UT) tablet Take 1,000 Units by mouth at bedtime.     [provider]  famotidine (PEPCID) 20 MG tablet Take 20 mg by mouth 2 (two) times daily.  05/20/18   [provider]  fexofenadine (ALLEGRA) 180 MG tablet TK 1 T PO ONCE D 03/15/19   [provider]  FLUoxetine (PROZAC) 10 MG capsule Take 10 mg by mouth daily.  09/09/18   [provider]  guaiFENesin-dextromethorphan (ROBITUSSIN DM) 100-10 MG/5ML syrup Take 5 mLs by mouth every 4 (four) hours as needed for cough.    [provider]  ketoconazole (NIZORAL) 2 % cream Apply 1 application topically 2 (two) times daily. 11/03/20   Vaillancourt, Aldona Bar, PA-C  Lactobacillus (ACIDOPHILUS PO) Take by mouth daily.     [provider]  Melatonin 5 MG TABS Take 5 mg by mouth at bedtime. Takes 2 3 mg tablets    [provider]  oxymetazoline (NASAL SPRAY ANTI-DRIP) 0.05 % nasal spray Place 1 spray into both nostrils 2 (two) times daily.    [provider]  primidone  (MYSOLINE) 50 MG tablet Take 50-100 mg by mouth 2 (two) times daily. Take 2 tablets (100mg ) by mouth every morning and 1 tablet (50mg ) by mouth every night at bedtime    [provider]  Probiotic Product (VSL#3) CAPS Take 1 capsule by mouth daily.     [provider]  propranolol ER (INDERAL LA) 160 MG SR capsule Take 160 mg by mouth daily.     [provider]  propranolol ER (INDERAL LA) 80 MG 24 hr capsule Take by mouth. 07/07/20   [provider]  psyllium (HYDROCIL/METAMUCIL) 95 % PACK Take 1 packet by mouth daily.    [provider]  tamsulosin (FLOMAX) 0.4 MG CAPS capsule Take by mouth daily.     [provider]  triamcinolone (KENALOG) 0.025 % cream Apply topically. 07/04/20  [provider]  UNABLE TO FIND APPLY FROM NECK DOWN DAILY 12/23/18   [provider]  valsartan (DIOVAN) 40 MG tablet Take 40 mg by mouth daily. 07/21/20   [provider]    Family History Family History  Problem Relation Age of Onset   Hematuria Father    Prostate cancer Father    Diabetes Father    Heart disease Mother    Diabetes Mother    Kidney disease Neg Hx    Bladder Cancer Neg Hx     Social History Social History   Tobacco Use   Smoking status: Never   Smokeless tobacco: Never  Vaping Use   Vaping Use: Never used  Substance Use Topics   Alcohol use: No    Alcohol/week: 0.0 standard drinks   Drug use: No     Allergies   Clindamycin, Flagyl [metronidazole], Ambien  [zolpidem], Penicillins, and Sulfa antibiotics   Review of Systems Review of Systems  Constitutional:  Positive for diaphoresis. Negative for activity change, appetite change and fever.  HENT:  Positive for congestion and rhinorrhea. Negative for ear pain and sore throat.   Eyes:  Negative for visual disturbance.  Respiratory:  Negative for cough, shortness of breath and wheezing.   Gastrointestinal:  Negative for diarrhea, nausea and  vomiting.  Skin:  Negative for rash.  Neurological:  Positive for tremors. Negative for dizziness and headaches.  Hematological: Negative.   Psychiatric/Behavioral: Negative.      Physical Exam Triage Vital Signs ED Triage Vitals  Enc Vitals Group     BP      Pulse      Resp      Temp      Temp src      SpO2      Weight      Height      Head Circumference      Peak Flow      Pain Score      Pain Loc      Pain Edu?      Excl. in [place]?    No data found.  Updated Vital Signs BP 140/84 (BP Location: Right Arm)   Pulse 79   Temp 98.9 F (37.2 C) (Oral)   Resp 20   SpO2 96%   Visual Acuity Right Eye Distance:   Left Eye Distance:   Bilateral Distance:    Right Eye Near:   Left Eye Near:    Bilateral Near:     Physical Exam Vitals and nursing note reviewed.  Constitutional:      General: He is not in acute distress.    Appearance: He is not toxic-appearing.  HENT:     Head: Normocephalic and atraumatic.     Right Ear: Tympanic membrane, ear canal and external ear normal. There is no impacted cerumen.     Left Ear: Tympanic membrane, ear canal and external ear normal. There is no impacted cerumen.     Nose: Congestion and rhinorrhea present.     Mouth/Throat:     Mouth: Mucous membranes are moist.     Pharynx: Oropharynx is clear. No posterior oropharyngeal erythema.  Cardiovascular:     Rate and Rhythm: Normal rate and regular rhythm.     Pulses: Normal pulses.     Heart sounds: Normal heart sounds. No murmur heard.   No gallop.  Pulmonary:     Effort: Pulmonary effort is normal.     Breath sounds: Normal breath sounds. No wheezing,  rhonchi or rales.  Musculoskeletal:     Cervical back: Normal range of motion and neck supple.  Lymphadenopathy:     Cervical: No cervical adenopathy.  Skin:    General: Skin is warm and dry.     Capillary Refill: Capillary refill takes less than 2 seconds.     Findings: No erythema or rash.  Neurological:     General: No  focal deficit present.     Mental Status: He is alert and oriented to person, place, and time.  Psychiatric:        Mood and Affect: Mood normal.        Behavior: Behavior normal.        Thought Content: Thought content normal.        Judgment: Judgment normal.     UC Treatments / Results  Labs (all labs ordered are listed, but only abnormal results are displayed) Labs Reviewed  CBC WITH DIFFERENTIAL/PLATELET - Abnormal; Notable for the following components:      Result Value   RBC 4.20 (*)    Platelets 102 (*)    Abs Immature Granulocytes 0.08 (*)    All other components within normal limits  COMPREHENSIVE METABOLIC PANEL - Abnormal; Notable for the following components:   Sodium 128 (*)    Glucose, Bld 137 (*)    Anion gap 4 (*)    All other components within normal limits  URINALYSIS, COMPLETE (UACMP) WITH MICROSCOPIC - Abnormal; Notable for the following components:   APPearance HAZY (*)    Glucose, UA 100 (*)    Hgb urine dipstick SMALL (*)    Protein, ur 30 (*)    Nitrite POSITIVE (*)    Leukocytes,Ua TRACE (*)    Bacteria, UA MANY (*)    All other components within normal limits  RESP PANEL BY RT-PCR (FLU A&B, COVID) [id number]  URINE CULTURE    EKG   Radiology No results found.  Procedures Procedures (including critical care time)  Medications Ordered in UC Medications - No data to display  Initial Impression / Assessment and Plan / UC Course  I have reviewed the triage vital signs and the nursing notes.  Pertinent labs & imaging results that were available during my care of the patient were reviewed by me and considered in my medical decision making (see chart for details).  Patient is a very pleasant 80 year old male here for evaluation of multiple complaints as indicated in the HPI above.  Patient's physical exam reveals pearly gray tympanic membranes with a normal light reflex and clear external auditory canals.  Nasal mucosa is erythematous and  edematous with clear nasal discharge.  Oropharyngeal exam is benign.  No cervical lymphadenopathy appreciated exam.  Cardiopulmonary exam is benign.  Patient has 2+ peripheral pulses globally and no pedal edema.  He has an indwelling catheter in place with a leg bag that has golden yellow urine in it.  Patient had episode of hypotension at home that I am not entirely convinced was accurate and may have been a result of an older cuff that has been calibrated as his blood pressure reading for home health was normal as was his reading here.  Patient's restlessness coupled with his sweating, feeling tingly there is a concern for possible manifestations of developing infection versus electrolyte imbalance.  His heart sounds are S1 is 2 without irregularity and I do not feel he needs an EKG at that time.  Will check CBC, CMP, UA, and respiratory triplex panel.  CBC low RBC count of 4.2 and low platelet count of 102 but otherwise unremarkable.  Patient's most recent CBC in the system is from [facility] on 09/18/2020 which showed a platelet count of 125 at that time.  CMP shows a sodium of 128 but is otherwise unremarkable.  Patient has a history of low sodium going back to January 2021 it was 129.  Remainder of electrolytes and transaminases are unremarkable.  Renal function shows a BUN of 19 and creatinine of 1.20 which is in line with his most recent where his BUN was 24 and his creatinine was 1.19.  Respiratory triplex panel is negative for COVID and flu.  Urinalysis shows hazy appearance, 100 glucose, small hemoglobin, 30 protein, nitrite positive, trace leukocytes, 6-10 WBCs, many bacteria.  Patient's work-up shows a nitrite positive UTI that is most likely catheter associated and patient is due for catheter change tomorrow.  He also has mild hyponatremia which is only slightly decreased from his baseline and I do not think is contributory.  Will treat patient for UTI with Keflex.  Patient has an allergy to  penicillin which causes a rash but he has taken cephalosporins, including Keflex, in the past without difficulty.    Final Clinical Impressions(s) / UC Diagnoses   Final diagnoses:  Urinary tract infection associated with indwelling urethral catheter, initial encounter [facility])     Discharge Instructions      Take the Keflex twice daily for 5 days with food for treatment of urinary tract infection.  Increase your oral fluid intake so that you increase your urine production and or flushing your urinary system.  Take an over-the-counter probiotic, such as Culturelle-Align-Activia, 1 hour after each dose of antibiotic to prevent diarrhea or yeast infections from forming.  We will culture urine and change the antibiotics if necessary.  Return for reevaluation, or see your primary care provider, for any new or worsening symptoms.      ED Prescriptions     Medication Sig Dispense Auth. Provider   cephALEXin (KEFLEX) 500 MG capsule Take 1 capsule (500 mg total) by mouth 2 (two) times daily for 5 days. 10 capsule Margarette Canada, NP      PDMP not reviewed this encounter.   Margarette Canada, NP 03/10/21 1430

## 2021-03-10 NOTE — ED Triage Notes (Signed)
Called pcp, told to go to urgent care.   Patient was restless last night.   This morning stated he felt "off", 189/106 was blood pressure at home.  Home health nurse 149/88  Reports having tingling feeling and clammy .  Tingle feeling is all over.   Denies pain.

## 2021-03-10 NOTE — Discharge Instructions (Addendum)
Take the Keflex twice daily for 5 days with food for treatment of urinary tract infection.  Increase your oral fluid intake so that you increase your urine production and or flushing your urinary system.  Take an over-the-counter probiotic, such as Culturelle-Align-Activia, 1 hour after each dose of antibiotic to prevent diarrhea or yeast infections from forming.  We will culture urine and change the antibiotics if necessary.  Return for reevaluation, or see your primary care provider, for any new or worsening symptoms.

## 2021-03-11 ENCOUNTER — Ambulatory Visit (INDEPENDENT_AMBULATORY_CARE_PROVIDER_SITE_OTHER): Payer: Medicare Other | Admitting: Physician Assistant

## 2021-03-11 DIAGNOSIS — R339 Retention of urine, unspecified: Secondary | ICD-10-CM

## 2021-03-11 NOTE — Progress Notes (Signed)
Cath Change/ Replacement  Patient is present today for a catheter change due to urinary retention.  75ml of water was removed from the balloon, a 16FR coude foley cath was removed with out difficulty.  Patient was cleaned and prepped in a sterile fashion with betadine. A 16 FR coude foley cath was replaced into the bladder no complications were noted Urine return was noted 38ml and urine was yellow in color. The balloon was filled with 68ml of sterile water. A leg bag was attached for drainage.  A night bag was also given to the patient.  Patient tolerated well.  Performed by: Debroah Loop, PA-C   Follow up: Return in about 4 weeks (around 04/08/2021) for Catheter exchange.

## 2021-03-13 ENCOUNTER — Telehealth (HOSPITAL_COMMUNITY): Payer: Self-pay | Admitting: Emergency Medicine

## 2021-03-13 LAB — URINE CULTURE
Culture: >=100000 — AB
Special Requests: NORMAL

## 2021-03-13 MED ORDER — DOXYCYCLINE HYCLATE 100 MG PO CAPS: 100.0000 mg | ORAL_CAPSULE | Freq: Two times a day (BID) | ORAL | 0 refills | Status: AC

## 2021-03-13 MED ORDER — CEFDINIR 300 MG PO CAPS: 300.0000 mg | ORAL_CAPSULE | Freq: Two times a day (BID) | ORAL | 0 refills | Status: AC

## 2021-03-16 ENCOUNTER — Ambulatory Visit: Payer: Self-pay | Admitting: Physician Assistant

## 2021-03-16 ENCOUNTER — Telehealth: Payer: Self-pay

## 2021-03-16 NOTE — Telephone Encounter (Signed)
OK per DPR, LMOM advising patient to f/u with PCP for C.Diff testing and evaluation.

## 2021-03-16 NOTE — Telephone Encounter (Signed)
Incoming call from pt's wife who states that the pt was recently diagnosed with a UTI at [place] urgent care (see care everywhere) the patient was started on two antibiotics and is now having fecal incontinence with diarrhea. Wife is concerned as pt has a hx of c-diff. Wife states that she usually gives the patient probiotics however has not been giving them while pt on antibiotics. Patient has approx 4 days left of antibiotics. Also of note pt was on a week of Keflex prior to this wound of meds. Please advise.

## 2021-03-16 NOTE — Telephone Encounter (Signed)
Please advise them to follow up with his PCP for C diff testing and evaluation.

## 2021-03-17 ENCOUNTER — Encounter: Payer: Medicare Other | Admitting: Physical Therapy

## 2021-03-24 ENCOUNTER — Encounter: Payer: Self-pay | Admitting: Physical Therapy

## 2021-03-24 ENCOUNTER — Ambulatory Visit: Payer: Medicare Other

## 2021-03-24 ENCOUNTER — Other Ambulatory Visit: Payer: Self-pay

## 2021-03-24 DIAGNOSIS — R2689 Other abnormalities of gait and mobility: Secondary | ICD-10-CM

## 2021-03-24 DIAGNOSIS — R293 Abnormal posture: Secondary | ICD-10-CM

## 2021-03-24 DIAGNOSIS — M6281 Muscle weakness (generalized): Secondary | ICD-10-CM | POA: Diagnosis not present

## 2021-03-24 DIAGNOSIS — R269 Unspecified abnormalities of gait and mobility: Secondary | ICD-10-CM

## 2021-03-24 NOTE — Therapy (Signed)
[facility] [address]. [place], [place], [address] Phone: [phone]   Fax:  [phone]  Physical Therapy Treatment  Patient Details  Name: Mike Holloway MRN: [id number] Date of Birth: [birth year] Referring Provider (PT): Dr. Rudene Christians   Encounter Date: 03/24/2021   PT End of Session - 03/24/21 1418     Visit Number 112    Number of Visits 119    Date for PT Re-Evaluation 04/28/21    Authorization - Visit Number 4    Authorization - Number of Visits 10    PT Start Time 7858    PT Stop Time 1401    PT Time Calculation (min) 56 min    Equipment Utilized During Treatment Gait belt    Activity Tolerance Patient tolerated treatment well;Patient limited by pain    Behavior During Therapy WFL for tasks assessed/performed             Past Medical History:  Diagnosis Date   Arthritis    Atrial flutter ([place])    Diabetes mellitus ([place])    Essential tremor    Essential tremor    deep brain stimulator    Hypercholesteremia    Hypertension    Incontinence    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[place])    grew on the testical   SIADH (syndrome of inappropriate ADH production) ([place])    Sleep apnea    Stroke [facility])     Past Surgical History:  Procedure Laterality Date   ABLATION     APPENDECTOMY     CATARACT EXTRACTION, BILATERAL     COLONOSCOPY WITH PROPOFOL N/A 03/17/2018   Procedure: COLONOSCOPY WITH PROPOFOL;  Surgeon: Jonathon Bellows, MD;  Location: [facility] 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   Subjective Assessment - 03/24/21 1410     Subjective Pt reported significant health complication over the last 2 weeks, including an UTI prolonged GI disturbance. Pt continues to report R knee pain with new onset of R lateral thigh/hip pain. Current aggravating factor is walking, with decrease pain when seated. Current pain at the start of tx 1/10 in L knee and lateral thigh/hip. Pt has a crutch that he is going to bring in next tx for possible usage during gait.    Patient is accompained by: Family member    Pertinent History Pt. states R knee is bothering him but it is the typical discomfort he experiences    Limitations House hold activities;Walking;Standing;Lifting    How long can you sit comfortably? 2 hours    How long can you stand comfortably? <10 min    How long can you walk comfortably? approx 5-10 min    Patient Stated Goals Improve LE strength/ gait and balance with daily tasks.  Improve gait/ safety/ progression to least assistive device.  Pain Score 1     Pain Location Knee    Pain Orientation Right    Pain Descriptors / Indicators Aching    Pain Onset More than a month ago    Pain Score 1    Pain Location Hip    Pain Orientation Right    Pain Descriptors / Indicators Aching    Pain Onset More than a month ago            There.ex.:   4# ankle wts. Seated marching/ LAQ/ heel raises 20x/exercise. Cuing provided for form/technique.  Standing marching/ lateral walking 3x in //-bars (heavy UE assist)/exercise. SpO2: 97 %-99% HR 75-78 BPM.     Nustep L5 10 min to off load R knee and to improve BLE strength/endurance. (MH applied to back). Not billed.      Neuro:   // Glennie Hawk:  Lateral cone toe taps x4 length with heavy bilat UE assist  CGA provided with walking to the car with  rollator. VC's for gait mechanics, descending slight decline of side walk, and sequencing of rollator to t/f into vehicle.      Pt requiring increased time to complete tasks and required frequent seated rest breaks due to increased pain and fatigue due to previous bout of medical complications thus limiting amount of exercise performed in today's session.     PT Education - 03/24/21 1417     Education Details Amb form/technique with single arm support amb in //bars    Methods Explanation    Comprehension Verbalized understanding;Returned demonstration                 PT Long Term Goals - 03/06/21 1532       PT LONG TERM GOAL #1   Title Pt. independent with HEP to increase B hip flexion/ R quad muscle strength 1/2 muscle grade to improve pain-free mobility.    Baseline R knee extension limited to 4/5 MMT secondary to pain/ fear of pain. 2/9: Deferred.  4/13: 4+/5 MMT (max potential).    Time 4    Period Weeks    Status Achieved      PT LONG TERM GOAL #2   Title Pt. will increase Berg balance test to >40 out of 56 to improve independence with gait/ decrease fall risk.    Baseline Berg: 22/56 (significant fall risk); 9/30 32/56.  11/10: 32 (limited by R knee pain)  2/9: 33/56 limited due to R knee and Low Back pain; 8/10: 29/56; 10/5: 38/56, 11/18/20: 28    Time 8    Period Weeks    Status Partially Met    Target Date 04/28/21      PT LONG TERM GOAL #3   Title Pt. able to ambulate 100 feet with consistent 2 point gait pattern and use of least restrictive assistive devce to improve household mobility.    Baseline Pt. able to ambulate with use of RW/ CGA to min. A for safety.   Heavy use of B UE.; 9/30 pt able to ambulate with rollator with inconsistent 2 point gait pattern, heavy reliance on UE CGA/supervision  2/9: Pt. uses 2 point step pattern with walking with rollader with decreased stride length. Pt. would be limited due to pain in the knee walking for >100 ft.; 8/10: Pt  demonstrates ability to ambulate 100' with consistent 2 point gait pattern with rollator. 11/18/20: Pt able to amb 130 ft with Rollator and 2-point gait pattern    Time 8    Period  Weeks    Status Partially Met    Target Date 04/28/21      PT LONG TERM GOAL #4   Title Pt. able to stand from normal chair with no UE assist to improve safety/independence with transfers.     Baseline Unable to stand from standard chair without heavy UE assist. 10/6, pt unable to stand from standard chair without UE support.  11/10: benefits from 1 UE assist    2/9: Pt. can rise from normal chair with 1 UE assist and must control descent with UE assist.; 8/10: Pt requires 2 UE for standing from standard chair height.; 10/5: Requires single UE to stand. 11/18/20: Pt requires B UE support    Time 8    Period Weeks    Status Partially Met    Target Date 04/28/21      PT LONG TERM GOAL #5   Title Pt. will perform a sit to stand with 1 UE support to be able to perform toileting activities with more independance.    Baseline Pt. requires 2 UE to push from to sit to stand from standard chair height. Pt. has grab rails available but can not get up by pulling.; 8/10: pt requires 2 UE's to STS from standard chair height.; 10/5: Only required single UE support to stand. Use of momentum but able to perform independently.    Time 8    Period Weeks    Status Achieved      PT LONG TERM GOAL #6   Title Pt. will ascend/ descend 4 steps with use of B handrails and step pattern with mod. independence safely to be able to enter son's house.    Baseline Step pattern with heavy UE assist and CGA/min. A for safety.; 8/10: Pt safely asc/desc 4 steps with B hand rails and step to pattern with mod indep to enter son's house safely.    Time 0    Period Weeks    Status Achieved      PT LONG TERM GOAL #7   Title Pt will be able to stand at bathroom sink for 5 min with no UE support to complete ADL's like brushing his teeth, washing his  hands, and combing his hair.    Baseline 8/10: Ability to stand with no UE support for 2 min.; 10/5: 3.5 min due to LBP. Has demonstrated in previous PT sessions he can stand for 5 min.    Time 8    Period Weeks    Status Partially Met    Target Date 04/28/21      PT LONG TERM GOAL #8   Title Pt will demonstrate ability to stand from a lowered surface below 18" with single Ue support to demonstrate improved BLE strength to improve transfers from toilet.    Baseline 10/5: Able to stand from 18" chair with single UE support with use of momentum. 11/18/20: able to after 3 attempts    Time 8    Period Weeks    Status Partially Met    Target Date 04/28/21                   Plan - 03/24/21 1423     Clinical Impression Statement Pt displayed decrease activity tolerance compared to previous tx 2/2 recent health complication and reduced in home/community activity. Pt displayed increase breathing during amb in //bars with SpO2:97-99% and HR: 75-79 Bpm. Pt was able to amb with single L UE support in // bars, however, unable to  without UE support or R UE support. Pt reported decrease R knee pain post grade 2 patellar mobs and distal/lateral quad STM. Pt. will continue to benefit from skilled physical therapy to progress POC to address remaining deficits to facilitate maximum functional capacity for optimal personal health and wellness for ADLs.    Personal Factors and Comorbidities Age    Examination-Activity Limitations Toileting;Stand;Squat;Lift;Stairs    Stability/Clinical Decision Making Evolving/Moderate complexity    Clinical Decision Making Moderate    Rehab Potential Fair    PT Frequency 1x / week    PT Duration 8 weeks    PT Treatment/Interventions Moist Heat;Functional mobility training;Therapeutic activities;Therapeutic exercise;Balance training;Patient/family education;Manual techniques;Passive range of motion;Neuromuscular re-education;Stair training;Gait training;ADLs/Self Care  Home Management    PT Next Visit Plan Continue to increase standing balance and stability to decrease risk of falls. Incorpate STM and pattelar mobs to R LE to increase activity tolerance.    PT Home Exercise Plan [email or web address]    Consulted and Agree with Plan of Care Patient;Family member/caregiver    Family Member Consulted spouse: [name]             Patient will benefit from skilled therapeutic intervention in order to improve the following deficits and impairments:  Abnormal gait, Decreased endurance, Decreased activity tolerance, Pain, Decreased balance, Impaired flexibility, Decreased strength, Postural dysfunction  Visit Diagnosis: Muscle weakness (generalized)  Gait difficulty  Abnormal posture  Balance problem     Problem List Patient Active Problem List   Diagnosis Date Noted   DVT femoral (deep venous thrombosis) with thrombophlebitis, left (HCC) 02/06/2020   Lymphedema 04/15/2019   Bilateral leg edema 01/25/2019   Hyponatremia 12/24/2018   SIADH (syndrome of inappropriate ADH production) (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   Tremor 09/04/2016   Chronic pain of right knee 08/10/2016   Right ankle pain 08/10/2016   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[place]) 12/11/2015   Urinary retention 09/08/2015   Lymphoma, 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[place]) 04/03/2015   Breathlessness on exertion 11/21/2014   Breath shortness 11/21/2014   Arthropathy 11/07/2014   Atrial flutter, paroxysmal ([place]) 11/07/2014   Type 2 diabetes mellitus ([place]) 11/07/2014    Benign essential tremor 11/07/2014   Benign essential HTN 11/07/2014   Mixed incontinence 11/07/2014   Hypercholesterolemia without hypertriglyceridemia 11/07/2014   Apnea, sleep 11/07/2014   Controlled type 2 diabetes mellitus without complication ([place]) 88/28/0034   Pure hypercholesterolemia 11/07/2014   Other abnormalities of gait and mobility 11/02/2011   Decreased mobility 11/02/2011   Abnormal gait 06/29/2011   Discoordination 06/29/2011    Fara Olden, SPT   [place]. [place] IV, PT, DPT Physical Therapist- [facility]  03/24/2021, 3:59 PM  [place] [facility] [address] [place], [place], [address] Phone: [phone]   Fax:  [phone]  Name: Xaivier Malay MRN: [id number] Date of Birth: 1940-12-27

## 2021-03-31 ENCOUNTER — Ambulatory Visit: Payer: Medicare Other | Attending: Orthopedic Surgery

## 2021-03-31 ENCOUNTER — Other Ambulatory Visit: Payer: Self-pay

## 2021-03-31 DIAGNOSIS — M6281 Muscle weakness (generalized): Secondary | ICD-10-CM | POA: Insufficient documentation

## 2021-03-31 DIAGNOSIS — R2689 Other abnormalities of gait and mobility: Secondary | ICD-10-CM | POA: Diagnosis present

## 2021-03-31 DIAGNOSIS — R293 Abnormal posture: Secondary | ICD-10-CM | POA: Insufficient documentation

## 2021-03-31 DIAGNOSIS — R269 Unspecified abnormalities of gait and mobility: Secondary | ICD-10-CM | POA: Diagnosis present

## 2021-03-31 NOTE — Therapy (Signed)
[facility] [address] [place], [place], [address] Phone: [phone]   Fax:  [phone]  Physical Therapy Treatment  Patient Details  Name: Mike Holloway MRN: [id number] Date of Birth: 01-21-1941 Referring Provider (PT): Dr. Rudene Christians   Encounter Date: 03/31/2021   PT End of Session - 03/31/21 1414     Visit Number 113    Number of Visits 119    Date for PT Re-Evaluation 04/28/21    Authorization - Visit Number 5    Authorization - Number of Visits 10    PT Start Time 1638    PT Stop Time 1346    PT Time Calculation (min) 40 min    Equipment Utilized During Treatment Gait belt    Activity Tolerance Patient tolerated treatment well;Patient limited by pain    Behavior During Therapy WFL for tasks assessed/performed             Past Medical History:  Diagnosis Date   Arthritis    Atrial flutter ([place])    Diabetes mellitus ([place])    Essential tremor    Essential tremor    deep brain stimulator    Hypercholesteremia    Hypertension    Incontinence    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[place])    grew on the testical   SIADH (syndrome of inappropriate ADH production) ([place])    Sleep apnea    Stroke [facility])     Past Surgical History:  Procedure Laterality Date   ABLATION     APPENDECTOMY     CATARACT EXTRACTION, BILATERAL     COLONOSCOPY WITH PROPOFOL N/A 03/17/2018   Procedure: COLONOSCOPY WITH PROPOFOL;  Surgeon: Jonathon Bellows, MD;  Location: [facility] 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   Subjective Assessment - 03/31/21 1345     Subjective Pt presents to tx with 1/10 R knee pain that is similar to his standard baseline. Pt brought his "Strong Arm" cane and would like to practice during tx. Pt has a cardio appt on Thursday 2/2 to abnormal EKG on previous reading.    Patient is accompained by: Family member    Pertinent History Pt. states R knee is bothering him but it is the typical discomfort he experiences    Limitations House hold activities;Walking;Standing;Lifting    How long can you sit comfortably? 2 hours    How long can you stand comfortably? <10 min    How long can you walk comfortably? approx 5-10 min    Patient Stated Goals Improve LE strength/ gait and balance with daily tasks.  Improve gait/ safety/ progression to least assistive device.    Currently in Pain? Yes    Pain Score 1     Pain Location Knee    Pain Onset More than a month ago  Pain Onset More than a month ago             There.ex:   4# ankle wts. Seated marching/ LAQ/ heel raises 20x/exercise. Cuing provided for form/technique.  Standing marching/ lateral walking 3x in //-bars (heavy UE assist)/exercise. SpO2: 97%, HR 80-82 BPM.      Nustep L5 10 min to off load R knee and to improve BLE strength/endurance. (MH applied to back). Not billed. Performed at end of session.        Neuro:   // Glennie Hawk:   Amb with unilat "strong hand" cane in R hand in the // bars. Pt was was unable to remove L hand from // bars and required CGA/Min A with gait belt. Verbal cueing to facilitate proper step/cane sequencing with little success 2/2 to fear of movement and tremor of UE leading to poor support with SPC.   STS 2x5 from standard height chair and 2x airex pad to promote transfer and standing balance without UE  assist. Pt held a ball with over head ext at the top of the standing transfer. Pt reported 3-4/10 pain with first set and 2/10 pain with 2nd set post manual intervention (see below).     CGA provided with walking to the car with rollator. VC's for gait mechanics, descending slight decline of side walk, and sequencing of rollator to t/f into vehicle.   Manual Therapy:  STM to distal quad, grade 2-3 patellar mobs, and grade 2-3 ant/post tibial-femoral glide to reduce R knee pain with STS transfer. 5 min, not billed.         Pt requiring increased time to complete tasks and required frequent seated rest breaks due to increased pain and fatigue due to previous bout of medical complications thus limiting amount of exercise performed in today's session.    PT Education - 03/31/21 1413     Education Details Pt educated on "Strong-arm" cane sequencing and continue home/community amb with rolling walking.    Person(s) Educated Patient    Methods Explanation                 PT Long Term Goals - 03/06/21 1532       PT LONG TERM GOAL #1   Title Pt. independent with HEP to increase B hip flexion/ R quad muscle strength 1/2 muscle grade to improve pain-free mobility.    Baseline R knee extension limited to 4/5 MMT secondary to pain/ fear of pain. 2/9: Deferred.  4/13: 4+/5 MMT (max potential).    Time 4    Period Weeks    Status Achieved      PT LONG TERM GOAL #2   Title Pt. will increase Berg balance test to >40 out of 56 to improve independence with gait/ decrease fall risk.    Baseline Berg: 22/56 (significant fall risk); 9/30 32/56.  11/10: 32 (limited by R knee pain)  2/9: 33/56 limited due to R knee and Low Back pain; 8/10: 29/56; 10/5: 38/56, 11/18/20: 28    Time 8    Period Weeks    Status Partially Met    Target Date 04/28/21      PT LONG TERM GOAL #3   Title Pt. able to ambulate 100 feet with consistent 2 point gait pattern and use of least restrictive assistive devce to  improve household mobility.    Baseline Pt. able to ambulate with use of RW/ CGA to min. A for safety.   Heavy use of B UE.; 9/30  pt able to ambulate with rollator with inconsistent 2 point gait pattern, heavy reliance on UE CGA/supervision  2/9: Pt. uses 2 point step pattern with walking with rollader with decreased stride length. Pt. would be limited due to pain in the knee walking for >100 ft.; 8/10: Pt demonstrates ability to ambulate 100' with consistent 2 point gait pattern with rollator. 11/18/20: Pt able to amb 130 ft with Rollator and 2-point gait pattern    Time 8    Period Weeks    Status Partially Met    Target Date 04/28/21      PT LONG TERM GOAL #4   Title Pt. able to stand from normal chair with no UE assist to improve safety/independence with transfers.     Baseline Unable to stand from standard chair without heavy UE assist. 10/6, pt unable to stand from standard chair without UE support.  11/10: benefits from 1 UE assist    2/9: Pt. can rise from normal chair with 1 UE assist and must control descent with UE assist.; 8/10: Pt requires 2 UE for standing from standard chair height.; 10/5: Requires single UE to stand. 11/18/20: Pt requires B UE support    Time 8    Period Weeks    Status Partially Met    Target Date 04/28/21      PT LONG TERM GOAL #5   Title Pt. will perform a sit to stand with 1 UE support to be able to perform toileting activities with more independance.    Baseline Pt. requires 2 UE to push from to sit to stand from standard chair height. Pt. has grab rails available but can not get up by pulling.; 8/10: pt requires 2 UE's to STS from standard chair height.; 10/5: Only required single UE support to stand. Use of momentum but able to perform independently.    Time 8    Period Weeks    Status Achieved      PT LONG TERM GOAL #6   Title Pt. will ascend/ descend 4 steps with use of B handrails and step pattern with mod. independence safely to be able to enter son's  house.    Baseline Step pattern with heavy UE assist and CGA/min. A for safety.; 8/10: Pt safely asc/desc 4 steps with B hand rails and step to pattern with mod indep to enter son's house safely.    Time 0    Period Weeks    Status Achieved      PT LONG TERM GOAL #7   Title Pt will be able to stand at bathroom sink for 5 min with no UE support to complete ADL's like brushing his teeth, washing his hands, and combing his hair.    Baseline 8/10: Ability to stand with no UE support for 2 min.; 10/5: 3.5 min due to LBP. Has demonstrated in previous PT sessions he can stand for 5 min.    Time 8    Period Weeks    Status Partially Met    Target Date 04/28/21      PT LONG TERM GOAL #8   Title Pt will demonstrate ability to stand from a lowered surface below 18" with single Ue support to demonstrate improved BLE strength to improve transfers from toilet.    Baseline 10/5: Able to stand from 18" chair with single UE support with use of momentum. 11/18/20: able to after 3 attempts    Time 8    Period Weeks    Status Partially  Met    Target Date 04/28/21                   Plan - 03/31/21 1416     Clinical Impression Statement Pt displayed increase activity tolerance from last tx and continues to get better after previous extensive health complication. Pt continue to display mark standing balance and strength deficits that results in an increase fall risk. Pt did not display safe amb with "strong arm" cane in the // bars and was told to continue home/community amb with rolling walker. Pt. will continue to benefit from skilled physical therapy to progress POC to address remaining deficits to facilitate maximum functional capacity for optimal personal health and wellness for ADLs.    Personal Factors and Comorbidities Age    Examination-Activity Limitations Toileting;Stand;Squat;Lift;Stairs    Stability/Clinical Decision Making Evolving/Moderate complexity    Clinical Decision Making Moderate     Rehab Potential Fair    PT Frequency 1x / week    PT Duration 8 weeks    PT Treatment/Interventions Moist Heat;Functional mobility training;Therapeutic activities;Therapeutic exercise;Balance training;Patient/family education;Manual techniques;Passive range of motion;Neuromuscular re-education;Stair training;Gait training;ADLs/Self Care Home Management    PT Next Visit Plan Continue to increase standing balance and stability to decrease risk of falls. Continue STM and pattelar mobs to R LE to increase activity tolerance.    PT Home Exercise Plan [email or web address]    Consulted and Agree with Plan of Care Patient;Family member/caregiver    Family Member Consulted spouse: [name]             Patient will benefit from skilled therapeutic intervention in order to improve the following deficits and impairments:  Abnormal gait, Decreased endurance, Decreased activity tolerance, Pain, Decreased balance, Impaired flexibility, Decreased strength, Postural dysfunction  Visit Diagnosis: Muscle weakness (generalized)  Abnormal posture  Balance problem  Gait difficulty     Problem List Patient Active Problem List   Diagnosis Date Noted   DVT femoral (deep venous thrombosis) with thrombophlebitis, left (HCC) 02/06/2020   Lymphedema 04/15/2019   Bilateral leg edema 01/25/2019   Hyponatremia 12/24/2018   SIADH (syndrome of inappropriate ADH production) ([place]) 12/24/2018   Erosion of urethra due to catheterization of urinary tract ([place]) 10/27/2018   Generalized weakness 10/14/2018   Hip fracture ([place]) 09/15/2018   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   HLD (hyperlipidemia) 03/24/2018   Contusion of right knee 02/14/2018   Bradycardia 12/28/2017   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   Tremor 09/04/2016   Chronic pain of right knee 08/10/2016   Right ankle pain 08/10/2016    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[place]) 12/11/2015   Urinary retention 09/08/2015   Lymphoma, 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[place]) 04/03/2015   Breathlessness on exertion 11/21/2014   Breath shortness 11/21/2014   Arthropathy 11/07/2014   Atrial flutter, paroxysmal ([place]) 11/07/2014   Type 2 diabetes mellitus ([place]) 11/07/2014   Benign essential tremor 11/07/2014   Benign essential HTN 11/07/2014   Mixed incontinence 11/07/2014   Hypercholesterolemia without hypertriglyceridemia 11/07/2014   Apnea, sleep 11/07/2014   Controlled type 2 diabetes mellitus without complication ([place]) 84/66/5993   Pure hypercholesterolemia 11/07/2014   Other abnormalities of gait and mobility 11/02/2011   Decreased mobility 11/02/2011   Abnormal gait 06/29/2011   Discoordination 06/29/2011    Richardson Chiquito Normon Pettijohn SPT  [place]. Fairly IV, PT, DPT Physical Therapist- [facility]  03/31/2021, 5:01 PM  [facility] [address] [place], [place], [address] Phone: [phone]   Fax:  [phone]  Name: Mike Holloway MRN: [id number] Date of Birth: 1941-05-08

## 2021-04-07 ENCOUNTER — Encounter: Payer: Medicare Other | Admitting: Physical Therapy

## 2021-04-09 ENCOUNTER — Ambulatory Visit (INDEPENDENT_AMBULATORY_CARE_PROVIDER_SITE_OTHER): Payer: Medicare Other | Admitting: Physician Assistant

## 2021-04-09 ENCOUNTER — Other Ambulatory Visit: Payer: Self-pay

## 2021-04-09 DIAGNOSIS — R339 Retention of urine, unspecified: Secondary | ICD-10-CM

## 2021-04-09 NOTE — Progress Notes (Signed)
Cath Change/ Replacement  Patient is present today for a catheter change due to urinary retention.  29ml of water was removed from the balloon, a 16FR coude foley cath was removed without difficulty.  Patient was cleaned and prepped in a sterile fashion with betadine and 2% lidocaine jelly was instilled into the urethra. A 16 FR coude foley cath was replaced into the bladder no complications were noted Urine return was noted 24ml and urine was yellow in color. The balloon was filled with 98ml of sterile water. A leg bag was attached for drainage.  A night bag was also given to the patient. Patient tolerated well.    Performed by: Debroah Loop, PA-C   Follow up: Return in about 4 weeks (around 05/07/2021) for Catheter exchange.

## 2021-04-14 ENCOUNTER — Other Ambulatory Visit: Payer: Self-pay

## 2021-04-14 ENCOUNTER — Ambulatory Visit: Payer: Medicare Other | Admitting: Physical Therapy

## 2021-04-14 ENCOUNTER — Encounter: Payer: Self-pay | Admitting: Physical Therapy

## 2021-04-14 DIAGNOSIS — M6281 Muscle weakness (generalized): Secondary | ICD-10-CM

## 2021-04-14 DIAGNOSIS — R2689 Other abnormalities of gait and mobility: Secondary | ICD-10-CM

## 2021-04-14 DIAGNOSIS — R293 Abnormal posture: Secondary | ICD-10-CM

## 2021-04-14 DIAGNOSIS — R269 Unspecified abnormalities of gait and mobility: Secondary | ICD-10-CM

## 2021-04-14 NOTE — Therapy (Signed)
[facility] [address] [place], [place], [address] Phone: [phone]   Fax:  [phone]  Physical Therapy Treatment  Patient Details  Name: Mike Holloway MRN: [id number] Date of Birth: April 30, 1941 Referring Provider (PT): Dr. Rudene Christians   Encounter Date: 04/14/2021   PT End of Session - 04/14/21 1725     Visit Number 114    Number of Visits 119    Date for PT Re-Evaluation 04/28/21    Authorization - Visit Number 6    Authorization - Number of Visits 10    PT Start Time 1310    PT Stop Time 1402    PT Time Calculation (min) 52 min    Equipment Utilized During Treatment Gait belt    Activity Tolerance Patient tolerated treatment well;Patient limited by pain    Behavior During Therapy WFL for tasks assessed/performed             Past Medical History:  Diagnosis Date   Arthritis    Atrial flutter ([place])    Diabetes mellitus ([place])    Essential tremor    Essential tremor    deep brain stimulator    Hypercholesteremia    Hypertension    Incontinence    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   SIADH (syndrome of inappropriate ADH production) ([place])    Sleep apnea    Stroke [facility])     Past Surgical History:  Procedure Laterality Date   ABLATION     APPENDECTOMY     CATARACT EXTRACTION, BILATERAL     COLONOSCOPY WITH PROPOFOL N/A 03/17/2018   Procedure: COLONOSCOPY WITH PROPOFOL;  Surgeon: Jonathon Bellows, MD;  Location: [facility] 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   Procedure: FECAL TRANSPLANT;  Surgeon: Jonathon Bellows, MD;  Location: [facility] ENDOSCOPY;  Service: Gastroenterology;  Laterality: N/A;   HEMORRHOID SURGERY     HERNIA REPAIR     HIP FRACTURE SURGERY     INTRAMEDULLARY (IM) NAIL INTERTROCHANTERIC Right 09/16/2018   Procedure: INTRAMEDULLARY (IM) NAIL INTERTROCHANTRIC;  Surgeon: Hessie Knows, MD;  Location: [facility] ORS;   Service: Orthopedics;  Laterality: Right;   IR CATHETER TUBE CHANGE  11/22/2018   NASAL SINUS SURGERY     ORCHIECTOMY     TONSILLECTOMY      There were no vitals filed for this visit.   Subjective Assessment - 04/14/21 1322     Subjective Pt presents to tx with 1/10 R knee pain that is similar to his standard baseline. Pt is doing better since surgery last Tuesday. No chest pain.    Patient is accompained by: Family member    Pertinent History Pt. states R knee is bothering him but it is the typical discomfort he experiences    Limitations House hold activities;Walking;Standing;Lifting    How long can you sit comfortably? 2 hours    How long can you stand comfortably? <10 min    How long can you walk comfortably? approx 5-10 min    Patient Stated Goals Improve LE strength/ gait and balance with daily tasks.  Improve gait/ safety/ progression to least assistive device.    Currently in Pain? Yes    Pain Score 1     Pain Location Knee    Pain Orientation Right    Pain Descriptors / Indicators Aching    Pain Onset More than a month ago  Pain Onset More than a month ago             There.ex:   4# ankle wts. Seated marching/ LAQ/ heel raises 20x/exercise. Cuing provided for form/technique.  Standing marching/ lateral walking 3x in //-bars (heavy UE assist)/exercise. SpO2: 97%, HR 80-82 BPM.      Nustep L5 10 min to off load R knee and to improve BLE strength/endurance. (MH applied to back). Not billed. Performed at beginning of session. SpO2: 97%, HR: 99 BPM.  Perform Nustep at end session future visit d/t pt decreased endurance.          Neuro:   // Glennie Hawk:   Amb B UE support the // bars. required CGA/Min A with gait belt.    STS 2x5 from standard height chair and 2x airex pad to promote transfer and standing balance without UE assist. P Pt reported 3-4/10 pain with first set and 2/10 pain with 2nd set post manual intervention (see below).      CGA provided with  walking to the car with rollator. VC's for gait mechanics, descending slight decline of side walk, and sequencing of rollator to t/f into vehicle.             PT Long Term Goals - 03/06/21 1532       PT LONG TERM GOAL #1   Title Pt. independent with HEP to increase B hip flexion/ R quad muscle strength 1/2 muscle grade to improve pain-free mobility.    Baseline R knee extension limited to 4/5 MMT secondary to pain/ fear of pain. 2/9: Deferred.  4/13: 4+/5 MMT (max potential).    Time 4    Period Weeks    Status Achieved      PT LONG TERM GOAL #2   Title Pt. will increase Berg balance test to >40 out of 56 to improve independence with gait/ decrease fall risk.    Baseline Berg: 22/56 (significant fall risk); 9/30 32/56.  11/10: 32 (limited by R knee pain)  2/9: 33/56 limited due to R knee and Low Back pain; 8/10: 29/56; 10/5: 38/56, 11/18/20: 28    Time 8    Period Weeks    Status Partially Met    Target Date 04/28/21      PT LONG TERM GOAL #3   Title Pt. able to ambulate 100 feet with consistent 2 point gait pattern and use of least restrictive assistive devce to improve household mobility.    Baseline Pt. able to ambulate with use of RW/ CGA to min. A for safety.   Heavy use of B UE.; 9/30 pt able to ambulate with rollator with inconsistent 2 point gait pattern, heavy reliance on UE CGA/supervision  2/9: Pt. uses 2 point step pattern with walking with rollader with decreased stride length. Pt. would be limited due to pain in the knee walking for >100 ft.; 8/10: Pt demonstrates ability to ambulate 100' with consistent 2 point gait pattern with rollator. 11/18/20: Pt able to amb 130 ft with Rollator and 2-point gait pattern    Time 8    Period Weeks    Status Partially Met    Target Date 04/28/21      PT LONG TERM GOAL #4   Title Pt. able to stand from normal chair with no UE assist to improve safety/independence with transfers.     Baseline Unable to stand from standard chair without  heavy UE assist. 10/6, pt unable to stand from standard chair without UE support.  11/10: benefits from 1 UE assist    2/9: Pt. can rise from normal chair with 1 UE assist and must control descent with UE assist.; 8/10: Pt requires 2 UE for standing from standard chair height.; 10/5: Requires single UE to stand. 11/18/20: Pt requires B UE support    Time 8    Period Weeks    Status Partially Met    Target Date 04/28/21      PT LONG TERM GOAL #5   Title Pt. will perform a sit to stand with 1 UE support to be able to perform toileting activities with more independance.    Baseline Pt. requires 2 UE to push from to sit to stand from standard chair height. Pt. has grab rails available but can not get up by pulling.; 8/10: pt requires 2 UE's to STS from standard chair height.; 10/5: Only required single UE support to stand. Use of momentum but able to perform independently.    Time 8    Period Weeks    Status Achieved      PT LONG TERM GOAL #6   Title Pt. will ascend/ descend 4 steps with use of B handrails and step pattern with mod. independence safely to be able to enter son's house.    Baseline Step pattern with heavy UE assist and CGA/min. A for safety.; 8/10: Pt safely asc/desc 4 steps with B hand rails and step to pattern with mod indep to enter son's house safely.    Time 0    Period Weeks    Status Achieved      PT LONG TERM GOAL #7   Title Pt will be able to stand at bathroom sink for 5 min with no UE support to complete ADL's like brushing his teeth, washing his hands, and combing his hair.    Baseline 8/10: Ability to stand with no UE support for 2 min.; 10/5: 3.5 min due to LBP. Has demonstrated in previous PT sessions he can stand for 5 min.    Time 8    Period Weeks    Status Partially Met    Target Date 04/28/21      PT LONG TERM GOAL #8   Title Pt will demonstrate ability to stand from a lowered surface below 18" with single Ue support to demonstrate improved BLE strength to  improve transfers from toilet.    Baseline 10/5: Able to stand from 18" chair with single UE support with use of momentum. 11/18/20: able to after 3 attempts    Time 8    Period Weeks    Status Partially Met    Target Date 04/28/21                   Plan - 04/14/21 1726     Clinical Impression Statement Pt displayed reduced activity tolerance from last treatment due to post surgery status. Pt continue to display mark standing balance and strength deficits that results in an increase fall risk. Pt requiring increased time to complete tasks and required frequent seated rest breaks due to increased pain and fatigue due to previous bout of medical complications thus limiting amount of exercise performed in today's session. SpO2 remained above 97% throughout session. R knee pain increased with weight bearing, which resolves immediately with sitting rest. Pt instructed to use ice at home to manage knee pain with activities. Pt. will continue to benefit from skilled physical therapy to progress POC to address remaining deficits to facilitate maximum functional  capacity for optimal personal health and wellness for ADLs.    Personal Factors and Comorbidities Age    Examination-Activity Limitations Toileting;Stand;Squat;Lift;Stairs    Stability/Clinical Decision Making Evolving/Moderate complexity    Clinical Decision Making Moderate    Rehab Potential Fair    PT Frequency 1x / week    PT Duration 8 weeks    PT Treatment/Interventions Moist Heat;Functional mobility training;Therapeutic activities;Therapeutic exercise;Balance training;Patient/family education;Manual techniques;Passive range of motion;Neuromuscular re-education;Stair training;Gait training;ADLs/Self Care Home Management    PT Next Visit Plan Continue to increase standing balance and stability to decrease risk of falls. STM and pattelar mobs to R LE to increase activity tolerance.    PT Home Exercise Plan [id number]    Consulted and  Agree with Plan of Care Patient;Family member/caregiver    Family Member Consulted spouse: [name]             Patient will benefit from skilled therapeutic intervention in order to improve the following deficits and impairments:  Abnormal gait, Decreased endurance, Decreased activity tolerance, Pain, Decreased balance, Impaired flexibility, Decreased strength, Postural dysfunction  Visit Diagnosis: Muscle weakness (generalized)  Balance problem  Abnormal posture  Gait difficulty     Problem List Patient Active Problem List   Diagnosis Date Noted   DVT femoral (deep venous thrombosis) with thrombophlebitis, left (HCC) 02/06/2020   Lymphedema 04/15/2019   Bilateral leg edema 01/25/2019   Hyponatremia 12/24/2018   SIADH (syndrome of inappropriate ADH production) ([place]) 12/24/2018   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) 10/27/2018   Generalized weakness 10/14/2018   Hip fracture ([place]) 09/15/2018   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   HLD (hyperlipidemia) 03/24/2018   Contusion of right knee 02/14/2018   Bradycardia 12/28/2017   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   Tremor 09/04/2016   Chronic pain of right knee 08/10/2016   Right ankle pain 08/10/2016   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[place]) 12/11/2015   Urinary retention 09/08/2015   Lymphoma, 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[place]) 04/03/2015   Breathlessness on exertion 11/21/2014   Breath shortness 11/21/2014   Arthropathy 11/07/2014   Atrial flutter, paroxysmal ([place]) 11/07/2014   Type 2 diabetes mellitus ([place]) 11/07/2014   Benign essential tremor 11/07/2014   Benign essential HTN 11/07/2014   Mixed incontinence 11/07/2014   Hypercholesterolemia without hypertriglyceridemia 11/07/2014   Apnea, sleep 11/07/2014   Controlled type 2 diabetes mellitus  without complication ([place]) 95/28/4132   Pure hypercholesterolemia 11/07/2014   Other abnormalities of gait and mobility 11/02/2011   Decreased mobility 11/02/2011   Abnormal gait 06/29/2011   Discoordination 06/29/2011   Pura Spice, PT, DPT # [id number] Shirley Friar, SPT 04/14/2021, 6:13 PM  [facility] [address] [place], [place], [address] Phone: [phone]   Fax:  [phone]  Name: Jarick Harkins MRN: [id number] Date of Birth: [birth year]

## 2021-04-21 ENCOUNTER — Other Ambulatory Visit: Payer: Self-pay

## 2021-04-21 ENCOUNTER — Ambulatory Visit: Payer: Medicare Other | Admitting: Physical Therapy

## 2021-04-21 ENCOUNTER — Encounter: Payer: Self-pay | Admitting: Physical Therapy

## 2021-04-21 DIAGNOSIS — R269 Unspecified abnormalities of gait and mobility: Secondary | ICD-10-CM

## 2021-04-21 DIAGNOSIS — M6281 Muscle weakness (generalized): Secondary | ICD-10-CM | POA: Diagnosis not present

## 2021-04-21 DIAGNOSIS — R2689 Other abnormalities of gait and mobility: Secondary | ICD-10-CM

## 2021-04-21 DIAGNOSIS — R293 Abnormal posture: Secondary | ICD-10-CM

## 2021-04-21 NOTE — Therapy (Signed)
[place] [facility] [address] [place], [place], [address] Phone: [phone]   Fax:  [phone]  Physical Therapy Treatment  Patient Details  Name: Mike Holloway MRN: [id number] Date of Birth: [birth year] Referring Provider (PT): Dr. Rudene Christians   Encounter Date: 04/21/2021   PT End of Session - 04/21/21 1536     Visit Number 115    Number of Visits 119    Date for PT Re-Evaluation 04/28/21    Authorization - Visit Number 7    Authorization - Number of Visits 10    PT Start Time 3568    PT Stop Time 1350    PT Time Calculation (min) 51 min    Equipment Utilized During Treatment Gait belt    Activity Tolerance Patient tolerated treatment well;Patient limited by pain    Behavior During Therapy WFL for tasks assessed/performed             Past Medical History:  Diagnosis Date   Arthritis    Atrial flutter ([place])    Diabetes mellitus ([place])    Essential tremor    Essential tremor    deep brain stimulator    Hypercholesteremia    Hypertension    Incontinence    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   SIADH (syndrome of inappropriate ADH production) ([place])    Sleep apnea    Stroke [facility])     Past Surgical History:  Procedure Laterality Date   ABLATION     APPENDECTOMY     CATARACT EXTRACTION, BILATERAL     COLONOSCOPY WITH PROPOFOL N/A 03/17/2018   Procedure: COLONOSCOPY WITH PROPOFOL;  Surgeon: Jonathon Bellows, MD;  Location: [facility] 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   Subjective Assessment - 04/21/21 1456     Subjective Pt presents to tx with 1/10 R knee pain that is similar to his standard baseline. No other symptoms reported. Pt states he went out to eat out at a restaurant for the first time in 2+ years.    Patient is accompained by: Family member    Pertinent History Pt. states R knee is bothering him but it is the typical discomfort he experiences    Limitations House hold activities;Walking;Standing;Lifting    How long can you sit comfortably? 2 hours    How long can you stand comfortably? <10 min    How long can you walk comfortably? approx 5-10 min    Patient Stated Goals Improve LE strength/ gait and balance with daily tasks.  Improve gait/ safety/ progression to least assistive device.    Currently in Pain? Yes    Pain Score 1     Pain Location Knee    Pain Orientation Right    Pain Descriptors / Indicators Aching  Pain Type Chronic pain    Pain Onset More than a month ago    Pain Frequency Intermittent    Pain Onset More than a month ago             Nustep L5 10 min to off load R knee and to improve BLE strength/endurance. (MH applied to back). Not billed.   Neuro:   // Glennie Hawk:   1) Lateral cone toe taps x4 laps with fwd ambulation with heavy bilat UE assist,   2) lateral cone taps with side way walking, heavy bilat UE assist. X4 laps  Sitting breaks required each lap  increased time to complete tasks and required frequent seated rest breaks due to increased pain and fatigue due to previous bout of medical complications thus limiting amount of exercise performed in today's session.   Thera.act  1) stair climbing with CGA x 4 laps, B UE required. Seated breaks each 2 laps  2)Standing in //bar, "fishing" x68mn 30s, no UE support.    SpO2 remained above 96% throughout session.     PT Long Term Goals - 03/06/21 1532       PT LONG TERM GOAL #1   Title Pt. independent with HEP to increase B hip flexion/ R quad muscle strength 1/2 muscle grade to improve pain-free mobility.    Baseline R knee extension limited to 4/5 MMT secondary to pain/ fear of pain. 2/9: Deferred.  4/13: 4+/5 MMT (max potential).    Time 4    Period Weeks    Status Achieved      PT LONG TERM GOAL #2   Title Pt. will increase Berg balance test to >40 out of 56 to improve independence with gait/ decrease fall risk.    Baseline Berg: 22/56 (significant fall risk); 9/30 32/56.  11/10: 32 (limited by R knee pain)  2/9: 33/56 limited due to R knee and Low Back pain; 8/10: 29/56; 10/5: 38/56, 11/18/20: 28    Time 8    Period Weeks    Status Partially Met    Target Date 04/28/21      PT LONG TERM GOAL #3   Title Pt. able to ambulate 100 feet with consistent 2 point gait pattern and use of least restrictive assistive devce to improve household mobility.    Baseline Pt. able to ambulate with use of RW/ CGA to min. A for safety.   Heavy use of B UE.; 9/30 pt able to ambulate with rollator with inconsistent 2 point gait pattern, heavy reliance on UE CGA/supervision  2/9: Pt. uses 2 point step pattern with walking with rollader with decreased stride length. Pt. would be limited due to pain in the knee walking for >100 ft.; 8/10: Pt demonstrates ability to ambulate 100' with consistent 2 point gait pattern with rollator. 11/18/20: Pt able to amb 130 ft with Rollator and 2-point gait pattern    Time 8    Period Weeks    Status Partially Met    Target Date 04/28/21      PT LONG TERM GOAL #4   Title Pt. able to stand from normal chair with no UE assist to improve safety/independence with transfers.     Baseline Unable to stand from standard chair without heavy UE assist. 10/6, pt unable to stand from standard chair without UE support.  11/10: benefits from 1 UE  assist    2/9: Pt. can rise from normal chair with 1 UE assist and must control descent with UE assist.;  8/10: Pt requires 2 UE for standing from standard chair height.; 10/5: Requires single UE to stand. 11/18/20: Pt requires B UE support    Time 8    Period Weeks    Status Partially Met    Target Date 04/28/21      PT LONG TERM GOAL #5   Title Pt. will perform a sit to stand with 1 UE support to be able to perform toileting activities with more independance.    Baseline Pt. requires 2 UE to push from to sit to stand from standard chair height. Pt. has grab rails available but can not get up by pulling.; 8/10: pt requires 2 UE's to STS from standard chair height.; 10/5: Only required single UE support to stand. Use of momentum but able to perform independently.    Time 8    Period Weeks    Status Achieved      PT LONG TERM GOAL #6   Title Pt. will ascend/ descend 4 steps with use of B handrails and step pattern with mod. independence safely to be able to enter son's house.    Baseline Step pattern with heavy UE assist and CGA/min. A for safety.; 8/10: Pt safely asc/desc 4 steps with B hand rails and step to pattern with mod indep to enter son's house safely.    Time 0    Period Weeks    Status Achieved      PT LONG TERM GOAL #7   Title Pt will be able to stand at bathroom sink for 5 min with no UE support to complete ADL's like brushing his teeth, washing his hands, and combing his hair.    Baseline 8/10: Ability to stand with no UE support for 2 min.; 10/5: 3.5 min due to LBP. Has demonstrated in previous PT sessions he can stand for 5 min.    Time 8    Period Weeks    Status Partially Met    Target Date 04/28/21      PT LONG TERM GOAL #8   Title Pt will demonstrate ability to stand from a lowered surface below 18" with single Ue support to demonstrate improved BLE strength to improve transfers from toilet.    Baseline 10/5: Able to stand from 18" chair with single UE support with use  of momentum. 11/18/20: able to after 3 attempts    Time 8    Period Weeks    Status Partially Met    Target Date 04/28/21                   Plan - 04/21/21 1536     Clinical Impression Statement Pt displayed improved activity tolerance since last treatment session. Pt continue demonstrates improved standing balance and strength tolerance and endurance as evidenced by increased dual tasking with standing time and endurance with stair climbing. Pt requiring increased time to complete tasks and required frequent seated rest breaks due to fatigue due to previous bout of medical complications thus limiting amount of exercise performed in today's session. SpO2 remained above 96% throughout session. No reports of R knee pain increased with weight bearing. Pt. will continue to benefit from skilled physical therapy to progress POC to address remaining deficits to facilitate maximum functional capacity for optimal personal health and wellness for ADLs.    Personal Factors and Comorbidities Age    Examination-Activity Limitations Toileting;Stand;Squat;Lift;Stairs    Stability/Clinical Decision Making Evolving/Moderate complexity    Clinical Decision Making Moderate    Rehab  Potential Fair    PT Frequency 1x / week    PT Duration 8 weeks    PT Treatment/Interventions Moist Heat;Functional mobility training;Therapeutic activities;Therapeutic exercise;Balance training;Patient/family education;Manual techniques;Passive range of motion;Neuromuscular re-education;Stair training;Gait training;ADLs/Self Care Home Management    PT Next Visit Plan Continue to increase standing balance and stability to decrease risk of falls. STM and pattelar mobs to R LE to increase activity tolerance.    PT Home Exercise Plan [email or web address]    Consulted and Agree with Plan of Care Patient;Family member/caregiver    Family Member Consulted spouse: [name]             Patient will benefit from skilled therapeutic intervention  in order to improve the following deficits and impairments:  Abnormal gait, Decreased endurance, Decreased activity tolerance, Pain, Decreased balance, Impaired flexibility, Decreased strength, Postural dysfunction  Visit Diagnosis: Muscle weakness (generalized)  Abnormal posture  Gait difficulty  Balance problem     Problem List Patient Active Problem List   Diagnosis Date Noted   DVT femoral (deep venous thrombosis) with thrombophlebitis, left (HCC) 02/06/2020   Lymphedema 04/15/2019   Bilateral leg edema 01/25/2019   Hyponatremia 12/24/2018   SIADH (syndrome of inappropriate ADH production) ([place]) 12/24/2018   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) 10/27/2018   Generalized weakness 10/14/2018   Hip fracture ([place]) 09/15/2018   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   HLD (hyperlipidemia) 03/24/2018   Contusion of right knee 02/14/2018   Bradycardia 12/28/2017   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   Tremor 09/04/2016   Chronic pain of right knee 08/10/2016   Right ankle pain 08/10/2016   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[place]) 12/11/2015   Urinary retention 09/08/2015   Lymphoma, 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[place]) 04/03/2015   Breathlessness on exertion 11/21/2014   Breath shortness 11/21/2014   Arthropathy 11/07/2014   Atrial flutter, paroxysmal ([place]) 11/07/2014   Type 2 diabetes mellitus ([place]) 11/07/2014   Benign essential tremor 11/07/2014   Benign essential HTN 11/07/2014   Mixed incontinence 11/07/2014   Hypercholesterolemia without hypertriglyceridemia 11/07/2014   Apnea, sleep 11/07/2014   Controlled type 2 diabetes mellitus without complication ([place]) 48/25/0037   Pure hypercholesterolemia 11/07/2014   Other abnormalities of gait and mobility 11/02/2011   Decreased mobility 11/02/2011   Abnormal  gait 06/29/2011   Discoordination 06/29/2011   Pura Spice, PT, DPT # [id number] Shirley Friar, SPT 04/21/2021, 5:11 PM  [facility] [address] [place], [place], [address] Phone: [phone]   Fax:  [phone]  Name: Mike Holloway MRN: [id number] Date of Birth: [birth year]

## 2021-04-28 ENCOUNTER — Encounter: Payer: Self-pay | Admitting: Physical Therapy

## 2021-04-28 ENCOUNTER — Other Ambulatory Visit: Payer: Self-pay

## 2021-04-28 ENCOUNTER — Ambulatory Visit: Payer: Medicare Other | Attending: Orthopedic Surgery | Admitting: Physical Therapy

## 2021-04-28 DIAGNOSIS — R2689 Other abnormalities of gait and mobility: Secondary | ICD-10-CM | POA: Insufficient documentation

## 2021-04-28 DIAGNOSIS — M6281 Muscle weakness (generalized): Secondary | ICD-10-CM | POA: Insufficient documentation

## 2021-04-28 DIAGNOSIS — R269 Unspecified abnormalities of gait and mobility: Secondary | ICD-10-CM | POA: Insufficient documentation

## 2021-04-28 DIAGNOSIS — R293 Abnormal posture: Secondary | ICD-10-CM | POA: Insufficient documentation

## 2021-04-28 NOTE — Therapy (Signed)
[facility] [address] [place], [place], [address] Phone: [phone]   Fax:  [phone]  Physical Therapy Treatment  Patient Details  Name: Mike Holloway MRN: [id number] Date of Birth: 1941/07/02 Referring Provider (PT): Dr. Rudene Christians   Encounter Date: 04/28/2021   PT End of Session - 04/28/21 1338     Visit Number 116    Number of Visits 119    Date for PT Re-Evaluation 04/28/21    Authorization - Visit Number 8    Authorization - Number of Visits 10    PT Start Time 1301    PT Stop Time 1354    PT Time Calculation (min) 53 min    Equipment Utilized During Treatment Gait belt    Activity Tolerance Patient tolerated treatment well;Patient limited by pain    Behavior During Therapy WFL for tasks assessed/performed             Past Medical History:  Diagnosis Date   Arthritis    Atrial flutter ([place])    Diabetes mellitus ([place])    Essential tremor    Essential tremor    deep brain stimulator    Hypercholesteremia    Hypertension    Incontinence    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[place])    grew on the testical   SIADH (syndrome of inappropriate ADH production) ([place])    Sleep apnea    Stroke [facility])     Past Surgical History:  Procedure Laterality Date   ABLATION     APPENDECTOMY     CATARACT EXTRACTION, BILATERAL     COLONOSCOPY WITH PROPOFOL N/A 03/17/2018   Procedure: COLONOSCOPY WITH PROPOFOL;  Surgeon: Jonathon Bellows, MD;  Location: [facility] 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   Subjective Assessment - 04/28/21 1417     Subjective Pt presents to tx with 2/10 R knee pain that is above his standard baseline. No other symptoms reported. Pt states he went to the hospital to get blood test this morning and walk about 163f earlier.    Patient is accompained by: Family member    Pertinent History Pt. states R knee is bothering him but it is the typical discomfort he experiences    Limitations House hold activities;Walking;Standing;Lifting    How long can you sit comfortably? 2 hours    How long can you stand comfortably? <10 min    How long can you walk comfortably? 10 min    Patient Stated Goals Improve LE strength/ gait and balance with daily tasks.  Improve gait/ safety/ progression to least assistive device.    Currently in Pain? Yes    Pain Score 2     Pain Location Knee    Pain Orientation Right    Pain Descriptors / Indicators Aching  Pain Type Chronic pain    Pain Onset More than a month ago                [facility] PT Assessment - 04/28/21 0001       Standardized Balance Assessment   Standardized Balance Assessment Berg Balance Test    Balance Master Testing Limits of Stability      Berg Balance Test   Sit to Stand Able to stand  independently using hands    Standing Unsupported Able to stand 2 minutes with supervision    Sitting with Back Unsupported but Feet Supported on Floor or Stool Able to sit safely and securely 2 minutes    Stand to Sit Controls descent by using hands    Transfers Able to transfer safely, definite need of hands    Standing Unsupported with Eyes Closed Able to stand 10 seconds with supervision    Standing Unsupported with Feet Together Able to place feet together independently and stand for 1 minute with supervision    From Standing,  Reach Forward with Outstretched Arm Can reach forward >12 cm safely (5")    From Standing Position, Pick up Object from Floor Unable to try/needs assist to keep balance    From Standing Position, Turn to Look Behind Over each Shoulder Needs supervision when turning    Turn 360 Degrees Needs assistance while turning    Standing Unsupported, Alternately Place Feet on Step/Stool Able to complete >2 steps/needs minimal assist    Standing Unsupported, One Foot in Front Needs help to step but can hold 15 seconds    Standing on One Leg Unable to try or needs assist to prevent fall    Total Score 28               Nustep L5 10 min to off load R knee and to improve BLE strength/endurance. (MH applied to back). Not billed.    Treatment:   Neuro:  Merrilee Jansky score: 28; overall pt required supervision and Min A support required. Pt limited from SLS, tandem and picking object from ground d/t fear of falling and limited ROM in R knee.   Ambulation with rollator and supervision: 115f; no sitting break needed.   Pt has achieved walking goal, BMerrilee Janskygoal is still not achieved.   There.act.   1) stair climbing with CGA x 4 laps, B UE required. Seated breaks each 2 laps 2) sit to stands from gray chair with UE assist and proper technique 5x.        PT Long Term Goals - 04/28/21 1753       PT LONG TERM GOAL #1   Title Pt. independent with HEP to increase B hip flexion/ R quad muscle strength 1/2 muscle grade to improve pain-free mobility.    Baseline R knee extension limited to 4/5 MMT secondary to pain/ fear of pain. 2/9: Deferred.  4/13: 4+/5 MMT (max potential).    Time 4    Period Weeks    Status Achieved      PT LONG TERM GOAL #2   Title Pt. will increase Berg balance test to >40 out of 56 to improve independence with gait/ decrease fall risk.    Baseline Berg: 22/56 (significant fall risk); 9/30 32/56.  11/10: 32 (limited by R knee pain)  2/9: 33/56 limited due to R knee and Low Back  pain; 8/10: 29/56; 10/5: 38/56, 11/18/20: 28. 04/28/2021: 28/56    Time 8    Period Weeks  Status Partially Met    Target Date 06/23/21      PT LONG TERM GOAL #3   Title Pt. able to ambulate 100 feet with consistent 2 point gait pattern and use of least restrictive assistive devce to improve household mobility.    Baseline Pt. able to ambulate with use of RW/ CGA to min. A for safety.   Heavy use of B UE.; 9/30 pt able to ambulate with rollator with inconsistent 2 point gait pattern, heavy reliance on UE CGA/supervision  2/9: Pt. uses 2 point step pattern with walking with rollader with decreased stride length. Pt. would be limited due to pain in the knee walking for >100 ft.; 8/10: Pt demonstrates ability to ambulate 100' with consistent 2 point gait pattern with rollator. 11/18/20: Pt able to amb 130 ft with Rollator and 2-point gait pattern. 04/28/21: Pt able to amb 148 ft with Rollator and 2-point gait pattern    Time 8    Period Weeks    Status Achieved      PT LONG TERM GOAL #4   Title Pt. able to stand from normal chair with no UE assist to improve safety/independence with transfers.     Baseline Unable to stand from standard chair without heavy UE assist. 10/6, pt unable to stand from standard chair without UE support.  11/10: benefits from 1 UE assist    2/9: Pt. can rise from normal chair with 1 UE assist and must control descent with UE assist.; 8/10: Pt requires 2 UE for standing from standard chair height.; 10/5: Requires single UE to stand. 11/18/20: Pt requires B UE support.  04/28/21: Pt requires single UE support    Time 8    Period Weeks    Status Partially Met    Target Date 06/23/21      PT LONG TERM GOAL #5   Title Pt. will perform a sit to stand with 1 UE support to be able to perform toileting activities with more independance.    Baseline Pt. requires 2 UE to push from to sit to stand from standard chair height. Pt. has grab rails available but can not get up by pulling.;  8/10: pt requires 2 UE's to STS from standard chair height.; 10/5: Only required single UE support to stand. Use of momentum but able to perform independently.    Time 8    Period Weeks    Status Achieved      PT LONG TERM GOAL #6   Title Pt. will ascend/ descend 4 steps with use of B handrails and step pattern with mod. independence safely to be able to enter son's house.    Baseline Step pattern with heavy UE assist and CGA/min. A for safety.; 8/10: Pt safely asc/desc 4 steps with B hand rails and step to pattern with mod indep to enter son's house safely.    Time 0    Period Weeks    Status Achieved      PT LONG TERM GOAL #7   Title Pt will be able to stand at bathroom sink for 5 min with no UE support to complete ADL's like brushing his teeth, washing his hands, and combing his hair.    Baseline 8/10: Ability to stand with no UE support for 2 min.; 10/5: 3.5 min due to LBP. Has demonstrated in previous PT sessions he can stand for 5 min. 04/28/2021: 5 min.    Time 8    Period Weeks    Status  Achieved      PT LONG TERM GOAL #8   Title Pt will demonstrate ability to stand from a lowered surface below 18" with single Ue support to demonstrate improved BLE strength to improve transfers from toilet.    Baseline 10/5: Able to stand from 18" chair with single UE support with use of momentum. 11/18/20: able to after 3 attempts.    Time 8    Period Weeks    Status Partially Met                Plan - 04/28/21 1420     Clinical Impression Statement Pt demonstrated improved activity tolerance as evidenced by achieving 148 ft ambulation with rollator without taking any sitting breaks. Pt is still unable to ambulate with quad cane d/t heavy use of B UE support with ambulation. B LE strength has decreased to 4-/5 MMT; pain with R knee extension. Pt acheived same socre on Berg test, pivot turn and rhomberg standing balance were challenging as evidenced by heavy B UE support. No reports of R knee  pain increased with weight bearing. Pt. will continue to benefit from skilled physical therapy to progress POC to address remaining deficits to facilitate maximum functional capacity for optimal personal health and wellness for ADLs. Future session will focus on standing tolerance, stair training and functional activities such as meal prep at kitchen counter.    Personal Factors and Comorbidities Age    Examination-Activity Limitations Toileting;Stand;Squat;Lift;Stairs    Stability/Clinical Decision Making Evolving/Moderate complexity    Clinical Decision Making Moderate    Rehab Potential Fair    PT Frequency 1x / week    PT Duration 8 weeks    PT Treatment/Interventions Moist Heat;Functional mobility training;Therapeutic activities;Therapeutic exercise;Balance training;Patient/family education;Manual techniques;Passive range of motion;Neuromuscular re-education;Stair training;Gait training;ADLs/Self Care Home Management    PT Next Visit Plan Continue to increase standing balance and stability to decrease risk of falls. STM and pattelar mobs to R LE to increase activity tolerance.    PT Home Exercise Plan [email or web address]    Consulted and Agree with Plan of Care Patient;Family member/caregiver    Family Member Consulted spouse: [name]             Patient will benefit from skilled therapeutic intervention in order to improve the following deficits and impairments:  Abnormal gait, Decreased endurance, Decreased activity tolerance, Pain, Decreased balance, Impaired flexibility, Decreased strength, Postural dysfunction  Visit Diagnosis: Muscle weakness (generalized)  Abnormal posture  Gait difficulty  Balance problem     Problem List Patient Active Problem List   Diagnosis Date Noted   DVT femoral (deep venous thrombosis) with thrombophlebitis, left (HCC) 02/06/2020   Lymphedema 04/15/2019   Bilateral leg edema 01/25/2019   Hyponatremia 12/24/2018   SIADH (syndrome of inappropriate ADH  production) ([place]) 12/24/2018   Erosion of urethra due to catheterization of urinary tract ([place]) 10/27/2018   Generalized weakness 10/14/2018   Hip fracture ([place]) 09/15/2018   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   HLD (hyperlipidemia) 03/24/2018   Contusion of right knee 02/14/2018   Bradycardia 12/28/2017   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   Tremor 09/04/2016   Chronic pain of right knee 08/10/2016   Right ankle pain 08/10/2016   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[place]) 12/11/2015   Urinary retention 09/08/2015   Lymphoma, 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[place]) 04/03/2015   Breathlessness on exertion 11/21/2014   Breath shortness 11/21/2014  Arthropathy 11/07/2014   Atrial flutter, paroxysmal ([place]) 11/07/2014   Type 2 diabetes mellitus ([place]) 11/07/2014   Benign essential tremor 11/07/2014   Benign essential HTN 11/07/2014   Mixed incontinence 11/07/2014   Hypercholesterolemia without hypertriglyceridemia 11/07/2014   Apnea, sleep 11/07/2014   Controlled type 2 diabetes mellitus without complication ([place]) 63/89/3734   Pure hypercholesterolemia 11/07/2014   Other abnormalities of gait and mobility 11/02/2011   Decreased mobility 11/02/2011   Abnormal gait 06/29/2011   Discoordination 06/29/2011   Pura Spice, PT, DPT # [id number] Shirley Friar, SPT 04/28/2021, 6:13 PM  [place] [facility] [address] [place], [place], [address] Phone: [phone]   Fax:  [phone]  Name: Emmit Oriley MRN: [id number] Date of Birth: Jul 21, 1941

## 2021-05-05 ENCOUNTER — Ambulatory Visit: Payer: Medicare Other | Admitting: Physical Therapy

## 2021-05-05 ENCOUNTER — Other Ambulatory Visit: Payer: Self-pay

## 2021-05-05 DIAGNOSIS — R2689 Other abnormalities of gait and mobility: Secondary | ICD-10-CM

## 2021-05-05 DIAGNOSIS — M6281 Muscle weakness (generalized): Secondary | ICD-10-CM

## 2021-05-05 DIAGNOSIS — R293 Abnormal posture: Secondary | ICD-10-CM

## 2021-05-05 DIAGNOSIS — R269 Unspecified abnormalities of gait and mobility: Secondary | ICD-10-CM

## 2021-05-05 NOTE — Therapy (Signed)
[facility] [address]. [place], [place], [address] Phone: [phone]   Fax:  [phone]  Physical Therapy Treatment  Patient Details  Name: Mike Holloway MRN: [id number] Date of Birth: 1941/09/08 Referring Provider (PT): Dr. Rudene Christians   Encounter Date: 05/05/2021   PT End of Session - 05/05/21 1707     Visit Number 117    Number of Visits 125    Date for PT Re-Evaluation 06/30/21    Authorization - Visit Number 9    Authorization - Number of Visits 10    PT Start Time 1302    PT Stop Time 1352    PT Time Calculation (min) 50 min    Equipment Utilized During Treatment Gait belt    Activity Tolerance Patient tolerated treatment well;Patient limited by pain    Behavior During Therapy WFL for tasks assessed/performed             Past Medical History:  Diagnosis Date   Arthritis    Atrial flutter ([place])    Diabetes mellitus ([place])    Essential tremor    Essential tremor    deep brain stimulator    Hypercholesteremia    Hypertension    Incontinence    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[place])    grew on the testical   SIADH (syndrome of inappropriate ADH production) ([place])    Sleep apnea    Stroke [facility])     Past Surgical History:  Procedure Laterality Date   ABLATION     APPENDECTOMY     CATARACT EXTRACTION, BILATERAL     COLONOSCOPY WITH PROPOFOL N/A 03/17/2018   Procedure: COLONOSCOPY WITH PROPOFOL;  Surgeon: Jonathon Bellows, MD;  Location: [facility] 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   Subjective Assessment - 05/05/21 1543     Subjective Pt presents to tx with 2/10 R knee pain that is above his standard baseline. No other symptoms reported. Pt has minimal activity over the weekend.    Patient is accompained by: Family member    Pertinent History Pt. states R knee is bothering him but it is the typical discomfort he experiences    Limitations House hold activities;Walking;Standing;Lifting    How long can you sit comfortably? 2 hours    How long can you stand comfortably? <10 min    How long can you walk comfortably? 10 min    Patient Stated Goals Improve LE strength/ gait and balance with daily tasks.  Improve gait/ safety/ progression to least assistive device.    Currently in Pain? Yes    Pain Score 2     Pain Location Knee    Pain Orientation Right    Pain Descriptors / Indicators Aching    Pain Type Chronic pain    Pain Onset More than  a month ago                [facility] PT Assessment - 05/06/21 0001       Assessment   Medical Diagnosis S/p R subtrochanteric hip fracture, S/p R ORIF fracture of hip.      Referring Provider (PT) Dr. Rudene Christians    Onset Date/Surgical Date 09/15/18    Prior Therapy Yes, known well to PT      Balance Screen   Has the patient fallen in the past 6 months No      Prior Function   Level of Independence Independent with household mobility with device      Cognition   Overall Cognitive Status Within Functional Limits for tasks assessed             Nustep L5 10 min to improve BLE strength/endurance. (MH applied to back). Not billed.     Treatment:    Neuro:   Standing balance with dual tasking:   1) at shelves: putting together screw-nut-bolt together. Activity tolerance limited by back pain and tremor in hands. 71f of ambulation  relieve the pain from back. Pt was able to tolerate 3x 2.5 minutes of standing with walking breaks between each set. Pt was on his feet for 8.5 minutes continuously.     Gait:   Ambulation with rollator and supervision: 320 ft, R-L head turn to look for letter on the wall. X12m.  Instruction in step pattern/ heel strike and upright posture.  Maintained good BOS during all aspects of walking, inside and outside.       Manual:  1) Sitting knee distraction 5 minutes on each leg. Pt reports pain decreased to 1/10 upon completion.   Pt continued to demonstrate increase of activity tolerance as indicated by continuous ambulation to 320 ft with dual tasking. Standing tolerance was also challenged with multi-tasking; pt was able to achieve 8'30'' of standing with walking breaks. Activities are still limited by bilateral knee pain, right more than left, lower back pain and tremor. Pt responded well to manual distraction to bilateral knee with slight pain reduction. Future session will focus on standing tolerance, stair training and functional activities such as meal prep at kitchen counter.      PT Long Term Goals - 05/06/21 0853       PT LONG TERM GOAL #1   Title Pt. independent with HEP to increase B hip flexion/ R quad muscle strength 1/2 muscle grade to improve pain-free mobility.    Baseline R knee extension limited to 4/5 MMT secondary to pain/ fear of pain. 2/9: Deferred.  4/13: 4+/5 MMT (max potential).    Time 4    Period Weeks    Status Achieved    Target Date 01/07/21      PT LONG TERM GOAL #2   Title Pt. will increase Berg balance test to >40 out of 56 to improve independence with gait/ decrease fall risk.    Baseline Berg: 22/56 (significant fall risk); 9/30 32/56.  11/10: 32 (limited by R knee pain)  2/9: 33/56 limited due to R knee and Low Back pain; 8/10: 29/56; 10/5: 38/56, 11/18/20: 28. 04/28/2021: 28/56    Time 8    Period Weeks    Status Partially Met    Target Date  06/30/21      PT LONG TERM GOAL #3   Title Pt. able to ambulate 100 feet with consistent 2 point gait pattern and use of least restrictive assistive devce  to improve household mobility.    Baseline Pt. able to ambulate with use of RW/ CGA to min. A for safety.   Heavy use of B UE.; 9/30 pt able to ambulate with rollator with inconsistent 2 point gait pattern, heavy reliance on UE CGA/supervision  2/9: Pt. uses 2 point step pattern with walking with rollader with decreased stride length. Pt. would be limited due to pain in the knee walking for >100 ft.; 8/10: Pt demonstrates ability to ambulate 100' with consistent 2 point gait pattern with rollator. 11/18/20: Pt able to amb 130 ft with Rollator and 2-point gait pattern. 04/28/21: Pt able to amb 148 ft with Rollator and 2-point gait pattern    Time 8    Period Weeks    Status Achieved    Target Date 04/28/21      PT LONG TERM GOAL #4   Title Pt. able to stand from normal chair with no UE assist to improve safety/independence with transfers.     Baseline Unable to stand from standard chair without heavy UE assist. 10/6, pt unable to stand from standard chair without UE support.  11/10: benefits from 1 UE assist    2/9: Pt. can rise from normal chair with 1 UE assist and must control descent with UE assist.; 8/10: Pt requires 2 UE for standing from standard chair height.; 10/5: Requires single UE to stand. 11/18/20: Pt requires B UE support.  04/28/21: Pt requires single UE support    Time 8    Period Weeks    Status Partially Met    Target Date 06/30/21      PT LONG TERM GOAL #5   Title Pt. will perform a sit to stand with 1 UE support to be able to perform toileting activities with more independance.    Baseline Pt. requires 2 UE to push from to sit to stand from standard chair height. Pt. has grab rails available but can not get up by pulling.; 8/10: pt requires 2 UE's to STS from standard chair height.; 10/5: Only required single UE support to stand.  Use of momentum but able to perform independently.    Time 8    Period Weeks    Status Achieved      PT LONG TERM GOAL #6   Title Pt. will ascend/ descend 4 steps with use of B handrails and step pattern with mod. independence safely to be able to enter son's house.    Baseline Step pattern with heavy UE assist and CGA/min. A for safety.; 8/10: Pt safely asc/desc 4 steps with B hand rails and step to pattern with mod indep to enter son's house safely.    Time 0    Period Weeks    Status Achieved      PT LONG TERM GOAL #7   Title Pt will be able to stand at bathroom sink for 5 min with no UE support to complete ADL's like brushing his teeth, washing his hands, and combing his hair.    Baseline 8/10: Ability to stand with no UE support for 2 min.; 10/5: 3.5 min due to LBP. Has demonstrated in previous PT sessions he can stand for 5 min. 04/28/2021: 5 min.    Time 8    Period Weeks    Status Achieved      PT LONG TERM GOAL #8   Title Pt will demonstrate ability to stand from a lowered surface below 18" with single Ue support to demonstrate improved BLE strength  to improve transfers from toilet.    Baseline 10/5: Able to stand from 18" chair with single UE support with use of momentum. 11/18/20: able to after 3 attempts.    Time 8    Period Weeks    Status Partially Met    Target Date 06/30/21                   Plan - 05/06/21 0834     Clinical Impression Statement Pt continued to demonstrate an increase in activity tolerance as indicated by an increase ambulation distance to 320 ft with dual tasking. Standing tolerance was also challenged with multi-tasking; pt was able to achieve 8 min. 30 sec. of standing with walking breaks. Activities are still limited by R knee/ low back pain and UE tremor. Pt responded well to manual distraction to bilateral knee with slight pain reduction. Future session will focus on standing tolerance, stair training and functional activities such as meal  prep at kitchen counter.  See updated PT goals.    Personal Factors and Comorbidities Age    Examination-Activity Limitations Toileting;Stand;Squat;Lift;Stairs    Stability/Clinical Decision Making Evolving/Moderate complexity    Clinical Decision Making Moderate    Rehab Potential Fair    PT Frequency 1x / week    PT Duration 8 weeks    PT Treatment/Interventions Moist Heat;Functional mobility training;Therapeutic activities;Therapeutic exercise;Balance training;Patient/family education;Manual techniques;Passive range of motion;Neuromuscular re-education;Stair training;Gait training;ADLs/Self Care Home Management    PT Next Visit Plan Continue to increase standing balance and stability to decrease risk of falls. STM and pattelar mobs to R LE to increase activity tolerance.    PT Home Exercise Plan [id number]    Consulted and Agree with Plan of Care Patient;Family member/caregiver    Family Member Consulted spouse: [name]             Patient will benefit from skilled therapeutic intervention in order to improve the following deficits and impairments:  Abnormal gait, Decreased endurance, Decreased activity tolerance, Pain, Decreased balance, Impaired flexibility, Decreased strength, Postural dysfunction  Visit Diagnosis: Muscle weakness (generalized)  Abnormal posture  Gait difficulty  Balance problem     Problem List Patient Active Problem List   Diagnosis Date Noted   DVT femoral (deep venous thrombosis) with thrombophlebitis, left (HCC) 02/06/2020   Lymphedema 04/15/2019   Bilateral leg edema 01/25/2019   Hyponatremia 12/24/2018   SIADH (syndrome of inappropriate ADH production) ([place]) 12/24/2018   Erosion of urethra due to catheterization of urinary tract ([place]) 10/27/2018   Generalized weakness 10/14/2018   Hip fracture ([place]) 09/15/2018   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    HLD (hyperlipidemia) 03/24/2018   Contusion of right knee 02/14/2018   Bradycardia 12/28/2017   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   Tremor 09/04/2016   Chronic pain of right knee 08/10/2016   Right ankle pain 08/10/2016   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[place]) 12/11/2015   Urinary retention 09/08/2015   Lymphoma, 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[place]) 04/03/2015   Breathlessness on exertion 11/21/2014   Breath shortness 11/21/2014   Arthropathy 11/07/2014   Atrial flutter, paroxysmal ([place]) 11/07/2014   Type 2 diabetes mellitus ([place]) 11/07/2014   Benign essential tremor 11/07/2014   Benign essential HTN 11/07/2014   Mixed incontinence 11/07/2014   Hypercholesterolemia without hypertriglyceridemia 11/07/2014   Apnea, sleep 11/07/2014   Controlled type 2 diabetes mellitus without complication ([place]) 81/19/1478   Pure hypercholesterolemia 11/07/2014  Other abnormalities of gait and mobility 11/02/2011   Decreased mobility 11/02/2011   Abnormal gait 06/29/2011   Discoordination 06/29/2011   Pura Spice, PT, DPT # [id number] Shirley Friar, SPT 05/06/2021, 9:59 AM  [facility] [address] [place], [place], [address] Phone: [phone]   Fax:  [phone]  Name: Mike Holloway MRN: [id number] Date of Birth: 05/07/1941

## 2021-05-06 ENCOUNTER — Encounter: Payer: Self-pay | Admitting: Physical Therapy

## 2021-05-06 ENCOUNTER — Ambulatory Visit (INDEPENDENT_AMBULATORY_CARE_PROVIDER_SITE_OTHER): Payer: Medicare Other | Admitting: Physician Assistant

## 2021-05-06 DIAGNOSIS — R339 Retention of urine, unspecified: Secondary | ICD-10-CM | POA: Diagnosis not present

## 2021-05-06 NOTE — Progress Notes (Signed)
Cath Change/ Replacement  Patient is present today for a catheter change due to urinary retention.  37m of water was removed from the balloon, a 16FR coude foley cath was removed without difficulty.  Patient was cleaned and prepped in a sterile fashion with betadine and 2% lidocaine jelly was instilled into the urethra. A 16 FR coude foley cath was replaced into the bladder no complications were noted Urine return was noted 145mand urine was yellow in color. The balloon was filled with 1073mf sterile water. A leg bag was attached for drainage.  Patient tolerated well..    Performed by: SamDebroah LoopA-C  Follow up: Return in about 4 weeks (around 06/03/2021) for Catheter exchange.

## 2021-05-07 ENCOUNTER — Telehealth: Payer: Self-pay | Admitting: *Deleted

## 2021-05-07 MED ORDER — MIRABEGRON ER 25 MG PO TB24: 25.0000 mg | ORAL_TABLET | Freq: Every day | ORAL | 0 refills | Status: DC

## 2021-05-07 NOTE — Telephone Encounter (Addendum)
Patient's wife called concerned with patient having spasms after catheter change. Denies any other symptoms. Would like to know if there is a medication he can take?

## 2021-05-07 NOTE — Telephone Encounter (Signed)
Per Mike Holloway patient to discontinue Flomax-can try Myrbetriq '25mg'$  samples. Samples left for patient upfront. Patient's wife voiced understanding.

## 2021-05-12 ENCOUNTER — Encounter: Payer: Self-pay | Admitting: Physical Therapy

## 2021-05-12 ENCOUNTER — Ambulatory Visit: Payer: Medicare Other | Admitting: Physical Therapy

## 2021-05-12 ENCOUNTER — Other Ambulatory Visit: Payer: Self-pay

## 2021-05-12 DIAGNOSIS — R293 Abnormal posture: Secondary | ICD-10-CM

## 2021-05-12 DIAGNOSIS — R269 Unspecified abnormalities of gait and mobility: Secondary | ICD-10-CM

## 2021-05-12 DIAGNOSIS — M6281 Muscle weakness (generalized): Secondary | ICD-10-CM | POA: Diagnosis not present

## 2021-05-12 DIAGNOSIS — R2689 Other abnormalities of gait and mobility: Secondary | ICD-10-CM

## 2021-05-12 NOTE — Therapy (Signed)
[facility] [address] [place], [place], [address] Phone: [phone]   Fax:  [phone]  Physical Therapy Treatment/Physical Therapy Progress Note   Dates of reporting period  02/10/2021   to   05/12/2021  Patient Details  Name: Mike Holloway MRN: [id number] Date of Birth: 1941-06-09 Referring Provider (PT): Dr. Rudene Christians   Encounter Date: 05/12/2021   PT End of Session - 05/12/21 1547     Visit Number 118    Number of Visits 125    Date for PT Re-Evaluation 06/30/21    Authorization - Visit Number 10    Authorization - Number of Visits 10    PT Start Time 6195    PT Stop Time 1406    PT Time Calculation (min) 63 min    Equipment Utilized During Treatment Gait belt    Activity Tolerance Patient tolerated treatment well;Patient limited by pain    Behavior During Therapy WFL for tasks assessed/performed             Past Medical History:  Diagnosis Date   Arthritis    Atrial flutter ([place])    Diabetes mellitus ([place])    Essential tremor    Essential tremor    deep brain stimulator    Hypercholesteremia    Hypertension    Incontinence    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   SIADH (syndrome of inappropriate ADH production) ([place])    Sleep apnea    Stroke [facility])     Past Surgical History:  Procedure Laterality Date   ABLATION     APPENDECTOMY     CATARACT EXTRACTION, BILATERAL     COLONOSCOPY WITH PROPOFOL N/A 03/17/2018   Procedure: COLONOSCOPY WITH PROPOFOL;  Surgeon: Jonathon Bellows, MD;  Location: [facility] 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   Subjective Assessment - 05/12/21 1539     Subjective Pt presents to tx with 4/10 R knee pain that is above his standard baseline. Pt had blood work done last week indicating Thrombocytopenia.    Patient is accompained by: Family member    Pertinent History Pt. states R knee is bothering him but it is the typical discomfort he experiences    Limitations House hold activities;Walking;Standing;Lifting    How long can you sit comfortably? 2 hours    How long can you stand comfortably? <10 min    How long can you walk comfortably? 10 min    Patient Stated Goals Improve LE strength/ gait and balance with daily tasks.  Improve gait/ safety/ progression to least assistive device.    Currently in Pain? Yes    Pain Score 4     Pain Location Knee    Pain Orientation Right    Pain Descriptors / Indicators  Aching    Pain Type Chronic pain    Pain Onset More than a month ago              Nustep L5 10 min to improve BLE strength/endurance. (MH applied to back). Not billed.     Treatment:    Neuro:   Standing balance with dual tasking:    1) At shelves: putting together screw-nut-bolt together. Activity tolerance limited by back pain and tremor in hands. 44f of ambulation relieve the pain from back. Pt was able to tolerate 4 x 2.5 minutes of standing with walking breaks between each set. Pt was on his feet for 10 minutes continuously.      Gait:    Ambulation with rollator and CGA: 13 minutes outside on uneven surface (up/down ramps, descending curbs with rollator).  Instruction in step pattern/ heel strike and upright posture. No LOB noticed. Maintained good BOS during all aspects of walking, inside and  outside.        Manual:  1) Sitting knee distraction with belt 5 minutes on each leg. Pt reports pain decreased to 2/10 upon completion.      PT Long Term Goals - 05/06/21 0853       PT LONG TERM GOAL #1   Title Pt. independent with HEP to increase B hip flexion/ R quad muscle strength 1/2 muscle grade to improve pain-free mobility.    Baseline R knee extension limited to 4/5 MMT secondary to pain/ fear of pain. 2/9: Deferred.  4/13: 4+/5 MMT (max potential).    Time 4    Period Weeks    Status Achieved    Target Date 01/07/21      PT LONG TERM GOAL #2   Title Pt. will increase Berg balance test to >40 out of 56 to improve independence with gait/ decrease fall risk.    Baseline Berg: 22/56 (significant fall risk); 9/30 32/56.  11/10: 32 (limited by R knee pain)  2/9: 33/56 limited due to R knee and Low Back pain; 8/10: 29/56; 10/5: 38/56, 11/18/20: 28. 04/28/2021: 28/56    Time 8    Period Weeks    Status Partially Met    Target Date 06/30/21      PT LONG TERM GOAL #3   Title Pt. able to ambulate 100 feet with consistent 2 point gait pattern and use of least restrictive assistive devce to improve household mobility.    Baseline Pt. able to ambulate with use of RW/ CGA to min. A for safety.   Heavy use of B UE.; 9/30 pt able to ambulate with rollator with inconsistent 2 point gait pattern, heavy reliance on UE CGA/supervision  2/9: Pt. uses 2 point step pattern with walking with rollader with decreased stride length. Pt. would be limited due to pain in the knee walking for >100 ft.; 8/10: Pt demonstrates ability to ambulate 100' with consistent 2 point gait pattern with rollator. 11/18/20: Pt able to amb 130 ft with Rollator and 2-point gait pattern. 04/28/21: Pt able to amb 148 ft with Rollator and 2-point gait pattern    Time 8    Period Weeks    Status Achieved    Target Date 04/28/21      PT LONG TERM GOAL #4   Title Pt. able to stand from normal chair with no UE assist to improve  safety/independence with transfers.     Baseline Unable to stand from standard chair without heavy UE assist. 10/6, pt unable to stand  from standard chair without UE support.  11/10: benefits from 1 UE assist    2/9: Pt. can rise from normal chair with 1 UE assist and must control descent with UE assist.; 8/10: Pt requires 2 UE for standing from standard chair height.; 10/5: Requires single UE to stand. 11/18/20: Pt requires B UE support.  04/28/21: Pt requires single UE support    Time 8    Period Weeks    Status Partially Met    Target Date 06/30/21      PT LONG TERM GOAL #5   Title Pt. will perform a sit to stand with 1 UE support to be able to perform toileting activities with more independance.    Baseline Pt. requires 2 UE to push from to sit to stand from standard chair height. Pt. has grab rails available but can not get up by pulling.; 8/10: pt requires 2 UE's to STS from standard chair height.; 10/5: Only required single UE support to stand. Use of momentum but able to perform independently.    Time 8    Period Weeks    Status Achieved      PT LONG TERM GOAL #6   Title Pt. will ascend/ descend 4 steps with use of B handrails and step pattern with mod. independence safely to be able to enter son's house.    Baseline Step pattern with heavy UE assist and CGA/min. A for safety.; 8/10: Pt safely asc/desc 4 steps with B hand rails and step to pattern with mod indep to enter son's house safely.    Time 0    Period Weeks    Status Achieved      PT LONG TERM GOAL #7   Title Pt will be able to stand at bathroom sink for 5 min with no UE support to complete ADL's like brushing his teeth, washing his hands, and combing his hair.    Baseline 8/10: Ability to stand with no UE support for 2 min.; 10/5: 3.5 min due to LBP. Has demonstrated in previous PT sessions he can stand for 5 min. 04/28/2021: 5 min.    Time 8    Period Weeks    Status Achieved      PT LONG TERM GOAL #8   Title Pt will  demonstrate ability to stand from a lowered surface below 18" with single Ue support to demonstrate improved BLE strength to improve transfers from toilet.    Baseline 10/5: Able to stand from 18" chair with single UE support with use of momentum. 11/18/20: able to after 3 attempts.    Time 8    Period Weeks    Status Partially Met    Target Date 06/30/21                   Plan - 05/12/21 1633     Clinical Impression Statement Pt activity tolerance challenged with ambulating outside on uneven surface (going up/down ramps, descending curbs) Standing tolerance was also challenged with multi-tasking; pt was able to achieve 10' of standing with walking breaks. Activities are still limited by bilateral knee pain, right more than left, lower back pain and tremor. Pt responded well to manual distraction to bilateral knee with slight pain reduction. Pt was able to ambulate to car through down ramp with supervision.    Personal Factors and Comorbidities Age    Examination-Activity Limitations Toileting;Stand;Squat;Lift;Stairs    Stability/Clinical Decision Making Evolving/Moderate complexity    Clinical Decision Making Moderate  Rehab Potential Fair    PT Frequency 1x / week    PT Duration 8 weeks    PT Treatment/Interventions Moist Heat;Functional mobility training;Therapeutic activities;Therapeutic exercise;Balance training;Patient/family education;Manual techniques;Passive range of motion;Neuromuscular re-education;Stair training;Gait training;ADLs/Self Care Home Management    PT Next Visit Plan Continue to increase standing balance and stability to decrease risk of falls. STM and pattelar mobs to R LE to increase activity tolerance.    PT Home Exercise Plan [email or web address]    Consulted and Agree with Plan of Care Patient;Family member/caregiver    Family Member Consulted spouse: [name]             Patient will benefit from skilled therapeutic intervention in order to improve the following  deficits and impairments:  Abnormal gait, Decreased endurance, Decreased activity tolerance, Pain, Decreased balance, Impaired flexibility, Decreased strength, Postural dysfunction  Visit Diagnosis: Muscle weakness (generalized)  Abnormal posture  Gait difficulty  Balance problem     Problem List Patient Active Problem List   Diagnosis Date Noted   DVT femoral (deep venous thrombosis) with thrombophlebitis, left (HCC) 02/06/2020   Lymphedema 04/15/2019   Bilateral leg edema 01/25/2019   Hyponatremia 12/24/2018   SIADH (syndrome of inappropriate ADH production) ([place]) 12/24/2018   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) 10/27/2018   Generalized weakness 10/14/2018   Hip fracture ([place]) 09/15/2018   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   HLD (hyperlipidemia) 03/24/2018   Contusion of right knee 02/14/2018   Bradycardia 12/28/2017   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   Tremor 09/04/2016   Chronic pain of right knee 08/10/2016   Right ankle pain 08/10/2016   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[place]) 12/11/2015   Urinary retention 09/08/2015   Lymphoma, 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[place]) 04/03/2015   Breathlessness on exertion 11/21/2014   Breath shortness 11/21/2014   Arthropathy 11/07/2014   Atrial flutter, paroxysmal ([place]) 11/07/2014   Type 2 diabetes mellitus ([place]) 11/07/2014   Benign essential tremor 11/07/2014   Benign essential HTN 11/07/2014   Mixed incontinence 11/07/2014   Hypercholesterolemia without hypertriglyceridemia 11/07/2014   Apnea, sleep 11/07/2014   Controlled type 2 diabetes mellitus without complication ([place]) 96/29/5284   Pure hypercholesterolemia 11/07/2014   Other abnormalities of gait and mobility 11/02/2011   Decreased mobility 11/02/2011   Abnormal gait 06/29/2011   Discoordination  06/29/2011   Pura Spice, PT, DPT # [id number] Shirley Friar, SPT 05/12/2021, 4:59 PM  [facility] [address] [place], [place], [address] Phone: [phone]   Fax:  [phone]  Name: Mike Holloway MRN: [id number] Date of Birth: [birth year]

## 2021-05-19 ENCOUNTER — Other Ambulatory Visit: Payer: Self-pay

## 2021-05-19 ENCOUNTER — Encounter: Payer: Self-pay | Admitting: Physical Therapy

## 2021-05-19 ENCOUNTER — Ambulatory Visit: Payer: Medicare Other | Admitting: Physical Therapy

## 2021-05-19 DIAGNOSIS — R2689 Other abnormalities of gait and mobility: Secondary | ICD-10-CM

## 2021-05-19 DIAGNOSIS — R269 Unspecified abnormalities of gait and mobility: Secondary | ICD-10-CM

## 2021-05-19 DIAGNOSIS — M6281 Muscle weakness (generalized): Secondary | ICD-10-CM

## 2021-05-19 DIAGNOSIS — R293 Abnormal posture: Secondary | ICD-10-CM

## 2021-05-19 NOTE — Therapy (Signed)
[facility] [address] [place], [place], [address] Phone: [phone]   Fax:  [phone]  Physical Therapy Treatment  Patient Details  Name: Mike Holloway MRN: [id number] Date of Birth: [birth year] Referring Provider (PT): Dr. Rudene Christians   Encounter Date: 05/19/2021   PT End of Session - 05/19/21 1440     Visit Number 119    Number of Visits 125    Date for PT Re-Evaluation 06/30/21    Authorization - Visit Number 1    Authorization - Number of Visits 10    PT Start Time 4163    PT Stop Time 1409    PT Time Calculation (min) 70 min    Equipment Utilized During Treatment Gait belt    Activity Tolerance Patient tolerated treatment well;Patient limited by pain    Behavior During Therapy WFL for tasks assessed/performed             Past Medical History:  Diagnosis Date   Arthritis    Atrial flutter ([place])    Diabetes mellitus ([place])    Essential tremor    Essential tremor    deep brain stimulator    Hypercholesteremia    Hypertension    Incontinence    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   SIADH (syndrome of inappropriate ADH production) ([place])    Sleep apnea    Stroke [facility])     Past Surgical History:  Procedure Laterality Date   ABLATION     APPENDECTOMY     CATARACT EXTRACTION, BILATERAL     COLONOSCOPY WITH PROPOFOL N/A 03/17/2018   Procedure: COLONOSCOPY WITH PROPOFOL;  Surgeon: Jonathon Bellows, MD;  Location: [facility] 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   Subjective Assessment - 05/19/21 1435     Subjective Pt presents to tx with 2/10 R knee pain that is above his standard baseline. Pt had a busy sunday at church.    Patient is accompained by: Family member    Pertinent History Pt. states R knee is bothering him but it is the typical discomfort he experiences    Limitations House hold activities;Walking;Standing;Lifting    How long can you sit comfortably? 2 hours    How long can you stand comfortably? <10 min    How long can you walk comfortably? 10 min    Patient Stated Goals Improve LE strength/ gait and balance with daily tasks.  Improve gait/ safety/ progression to least assistive device.    Currently in Pain? Yes    Pain Score 2     Pain Location Knee    Pain Orientation Right    Pain Descriptors / Indicators Aching    Pain Type Chronic pain    Pain Onset More than a month ago  Nustep L5 10 min to improve BLE strength/endurance. (MH applied to back). Not billed.  Pt stated soreness in L big toe exacerbated since a month ago.    Manual:  1) Sitting knee distraction 5 minutes on each leg. Pt reports pain decreased to 1/10 upon completion.    There.ex.   1) stair climbing with CGA x 4 laps, B UE required. 1 seated break needed after the third lap. Step to stepping pattern, up with L and down with R.   2) sit to stands from gray chair with UE assist and proper technique 5x.   3)//bar: high knees fwd/bwd x2 laps with B UE assist  4)Outside: ambulation 151f with rollator and CGA: 8 minutes outside on uneven surface (down ramps, descending curbs with rollator).  Reciprocal gait observed. Pt need extra time and demonstrated hesitation with rollator placement descending curb, no verbal cue required.  No complaint of pain in L  big toe with stair training and ambulation.       PT Long Term Goals - 05/06/21 0853       PT LONG TERM GOAL #1   Title Pt. independent with HEP to increase B hip flexion/ R quad muscle strength 1/2 muscle grade to improve pain-free mobility.    Baseline R knee extension limited to 4/5 MMT secondary to pain/ fear of pain. 2/9: Deferred.  4/13: 4+/5 MMT (max potential).    Time 4    Period Weeks    Status Achieved    Target Date 01/07/21      PT LONG TERM GOAL #2   Title Pt. will increase Berg balance test to >40 out of 56 to improve independence with gait/ decrease fall risk.    Baseline Berg: 22/56 (significant fall risk); 9/30 32/56.  11/10: 32 (limited by R knee pain)  2/9: 33/56 limited due to R knee and Low Back pain; 8/10: 29/56; 10/5: 38/56, 11/18/20: 28. 04/28/2021: 28/56    Time 8    Period Weeks    Status Partially Met    Target Date 06/30/21      PT LONG TERM GOAL #3   Title Pt. able to ambulate 100 feet with consistent 2 point gait pattern and use of least restrictive assistive devce to improve household mobility.    Baseline Pt. able to ambulate with use of RW/ CGA to min. A for safety.   Heavy use of B UE.; 9/30 pt able to ambulate with rollator with inconsistent 2 point gait pattern, heavy reliance on UE CGA/supervision  2/9: Pt. uses 2 point step pattern with walking with rollader with decreased stride length. Pt. would be limited due to pain in the knee walking for >100 ft.; 8/10: Pt demonstrates ability to ambulate 100' with consistent 2 point gait pattern with rollator. 11/18/20: Pt able to amb 130 ft with Rollator and 2-point gait pattern. 04/28/21: Pt able to amb 148 ft with Rollator and 2-point gait pattern    Time 8    Period Weeks    Status Achieved    Target Date 04/28/21      PT LONG TERM GOAL #4   Title Pt. able to stand from normal chair with no UE assist to improve safety/independence with transfers.     Baseline Unable to stand from standard chair without  heavy UE assist. 10/6, pt unable to stand from standard chair without UE support.  11/10: benefits from 1 UE assist    2/9: Pt. can rise from normal chair with 1  UE assist and must control descent with UE assist.; 8/10: Pt requires 2 UE for standing from standard chair height.; 10/5: Requires single UE to stand. 11/18/20: Pt requires B UE support.  04/28/21: Pt requires single UE support    Time 8    Period Weeks    Status Partially Met    Target Date 06/30/21      PT LONG TERM GOAL #5   Title Pt. will perform a sit to stand with 1 UE support to be able to perform toileting activities with more independance.    Baseline Pt. requires 2 UE to push from to sit to stand from standard chair height. Pt. has grab rails available but can not get up by pulling.; 8/10: pt requires 2 UE's to STS from standard chair height.; 10/5: Only required single UE support to stand. Use of momentum but able to perform independently.    Time 8    Period Weeks    Status Achieved      PT LONG TERM GOAL #6   Title Pt. will ascend/ descend 4 steps with use of B handrails and step pattern with mod. independence safely to be able to enter son's house.    Baseline Step pattern with heavy UE assist and CGA/min. A for safety.; 8/10: Pt safely asc/desc 4 steps with B hand rails and step to pattern with mod indep to enter son's house safely.    Time 0    Period Weeks    Status Achieved      PT LONG TERM GOAL #7   Title Pt will be able to stand at bathroom sink for 5 min with no UE support to complete ADL's like brushing his teeth, washing his hands, and combing his hair.    Baseline 8/10: Ability to stand with no UE support for 2 min.; 10/5: 3.5 min due to LBP. Has demonstrated in previous PT sessions he can stand for 5 min. 04/28/2021: 5 min.    Time 8    Period Weeks    Status Achieved      PT LONG TERM GOAL #8   Title Pt will demonstrate ability to stand from a lowered surface below 18" with single Ue support to demonstrate  improved BLE strength to improve transfers from toilet.    Baseline 10/5: Able to stand from 18" chair with single UE support with use of momentum. 11/18/20: able to after 3 attempts.    Time 8    Period Weeks    Status Partially Met    Target Date 06/30/21                   Plan - 05/19/21 1549     Clinical Impression Statement Manual distraction to B knee was effective to reduce B knee pain to baseline (1/10) from 2/10. Session focused on stair negotiation tolerance. Pt demonstrated increase endurance requiring sitting break after 3 laps. Deficits in stepping pattern revealed by step-to pattern with ascending/descending, B UE support. Pt also required extra time to place rollator when descending from a curb. No LOB observed. Future session should focus on stair negotiation tolerance to ensure safety getting in/out his sons house (7 steps).    Personal Factors and Comorbidities Age    Examination-Activity Limitations Toileting;Stand;Squat;Lift;Stairs    Stability/Clinical Decision Making Evolving/Moderate complexity    Clinical Decision Making Moderate    Rehab Potential Fair    PT Frequency 1x / week    PT Duration 8 weeks  PT Treatment/Interventions Moist Heat;Functional mobility training;Therapeutic activities;Therapeutic exercise;Balance training;Patient/family education;Manual techniques;Passive range of motion;Neuromuscular re-education;Stair training;Gait training;ADLs/Self Care Home Management    PT Next Visit Plan Future session should focus on stair negotiation tolerance to ensure safety getting in/out his sons house (7 steps).    PT Home Exercise Plan [email or web address]    Consulted and Agree with Plan of Care Patient;Family member/caregiver    Family Member Consulted spouse: [name]             Patient will benefit from skilled therapeutic intervention in order to improve the following deficits and impairments:  Abnormal gait, Decreased endurance, Decreased activity tolerance,  Pain, Decreased balance, Impaired flexibility, Decreased strength, Postural dysfunction  Visit Diagnosis: Muscle weakness (generalized)  Abnormal posture  Gait difficulty  Balance problem     Problem List Patient Active Problem List   Diagnosis Date Noted   DVT femoral (deep venous thrombosis) with thrombophlebitis, left (HCC) 02/06/2020   Lymphedema 04/15/2019   Bilateral leg edema 01/25/2019   Hyponatremia 12/24/2018   SIADH (syndrome of inappropriate ADH production) ([place]) 12/24/2018   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) 10/27/2018   Generalized weakness 10/14/2018   Hip fracture ([place]) 09/15/2018   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   HLD (hyperlipidemia) 03/24/2018   Contusion of right knee 02/14/2018   Bradycardia 12/28/2017   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   Tremor 09/04/2016   Chronic pain of right knee 08/10/2016   Right ankle pain 08/10/2016   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[place]) 12/11/2015   Urinary retention 09/08/2015   Lymphoma, 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[place]) 04/03/2015   Breathlessness on exertion 11/21/2014   Breath shortness 11/21/2014   Arthropathy 11/07/2014   Atrial flutter, paroxysmal ([place]) 11/07/2014   Type 2 diabetes mellitus ([place]) 11/07/2014   Benign essential tremor 11/07/2014   Benign essential HTN 11/07/2014   Mixed incontinence 11/07/2014   Hypercholesterolemia without hypertriglyceridemia 11/07/2014   Apnea, sleep 11/07/2014   Controlled type 2 diabetes mellitus without complication ([place]) 65/68/1275   Pure hypercholesterolemia 11/07/2014   Other abnormalities of gait and mobility 11/02/2011   Decreased mobility 11/02/2011   Abnormal gait 06/29/2011   Discoordination 06/29/2011   Pura Spice, PT, DPT # [id number] Shirley Friar, SPT 05/19/2021, 4:22 PM  [facility] [address] [place], [place], [address] Phone: [phone]   Fax:  [phone]  Name: Maks Cavallero MRN: [id number] Date of Birth: 1941-01-18

## 2021-05-26 ENCOUNTER — Ambulatory Visit: Payer: Medicare Other | Admitting: Physical Therapy

## 2021-05-26 ENCOUNTER — Other Ambulatory Visit: Payer: Self-pay

## 2021-05-26 DIAGNOSIS — R2689 Other abnormalities of gait and mobility: Secondary | ICD-10-CM

## 2021-05-26 DIAGNOSIS — M6281 Muscle weakness (generalized): Secondary | ICD-10-CM | POA: Diagnosis not present

## 2021-05-26 DIAGNOSIS — R269 Unspecified abnormalities of gait and mobility: Secondary | ICD-10-CM

## 2021-05-26 DIAGNOSIS — R293 Abnormal posture: Secondary | ICD-10-CM

## 2021-05-26 NOTE — Therapy (Signed)
[facility] [address] [place], [place], [address] Phone: [phone]   Fax:  [phone]  Physical Therapy Treatment  Patient Details  Name: Mike Holloway MRN: [id number] Date of Birth: April 03, 1941 Referring Provider (PT): Dr. Rudene Christians   Encounter Date: 05/26/2021   PT End of Session - 05/26/21 1304     Visit Number 120    Number of Visits 125    Date for PT Re-Evaluation 06/30/21    Authorization - Visit Number 2    Authorization - Number of Visits 10    PT Start Time 7903    PT Stop Time 1404    PT Time Calculation (min) 68 min    Equipment Utilized During Treatment Gait belt    Activity Tolerance Patient tolerated treatment well;Patient limited by pain    Behavior During Therapy WFL for tasks assessed/performed             Past Medical History:  Diagnosis Date   Arthritis    Atrial flutter ([place])    Diabetes mellitus ([place])    Essential tremor    Essential tremor    deep brain stimulator    Hypercholesteremia    Hypertension    Incontinence    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   SIADH (syndrome of inappropriate ADH production) ([place])    Sleep apnea    Stroke [facility])     Past Surgical History:  Procedure Laterality Date   ABLATION     APPENDECTOMY     CATARACT EXTRACTION, BILATERAL     COLONOSCOPY WITH PROPOFOL N/A 03/17/2018   Procedure: COLONOSCOPY WITH PROPOFOL;  Surgeon: Jonathon Bellows, MD;  Location: [facility] 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   Subjective Assessment - 05/26/21 1303     Subjective Pt presents to tx with 1.5/10 R knee pain prior to PT tx. session.  Pt. went to grandkids baptism this past weekend.    Patient is accompained by: Family member    Pertinent History Pt. states R knee is bothering him but it is the typical discomfort he experiences    Limitations House hold activities;Walking;Standing;Lifting    How long can you sit comfortably? 2 hours    How long can you stand comfortably? <10 min    How long can you walk comfortably? 10 min    Patient Stated Goals Improve LE strength/ gait and balance with daily tasks.  Improve gait/ safety/ progression to least assistive device.    Currently in Pain? Yes    Pain Score 2     Pain Location Knee    Pain Orientation Right    Pain Descriptors / Indicators Aching    Pain Onset More than a month ago  Pt. Reports L great toe pain is "a lot better" as compared to last week   There.ex.:  Nustep L5 10 min to improve BLE strength/endurance. (MH applied to back). Not billed.  1) 5# seated marching 20x/ heel and toe raises 20x/ LAQ 20x/ walking in //-bars 6x.      2) stair climbing with CGA x 4 laps, B UE required. No rest breaks required.  Step to gait pattern, up with L and down with R due to R knee issues.    3) sit to stands from gray chair with UE assist and proper technique 5x.     Neuro:  1) forward/ backwards walking with 5# ankle wts./ stepping over 3" plinths in //-bars.     2) Outside: ambulation 144f with rollator and CGA:  on uneven surface (down ramps, descending curbs with rollator).  Reciprocal gait observed. Pt need extra time and demonstrated hesitation with rollator placement descending curb/ ramp.  Good safety awareness.     No manual tx.  today          PT Long Term Goals - 05/06/21 0853       PT LONG TERM GOAL #1   Title Pt. independent with HEP to increase B hip flexion/ R quad muscle strength 1/2 muscle grade to improve pain-free mobility.    Baseline R knee extension limited to 4/5 MMT secondary to pain/ fear of pain. 2/9: Deferred.  4/13: 4+/5 MMT (max potential).    Time 4    Period Weeks    Status Achieved    Target Date 01/07/21      PT LONG TERM GOAL #2   Title Pt. will increase Berg balance test to >40 out of 56 to improve independence with gait/ decrease fall risk.    Baseline Berg: 22/56 (significant fall risk); 9/30 32/56.  11/10: 32 (limited by R knee pain)  2/9: 33/56 limited due to R knee and Low Back pain; 8/10: 29/56; 10/5: 38/56, 11/18/20: 28. 04/28/2021: 28/56    Time 8    Period Weeks    Status Partially Met    Target Date 06/30/21      PT LONG TERM GOAL #3   Title Pt. able to ambulate 100 feet with consistent 2 point gait pattern and use of least restrictive assistive devce to improve household mobility.    Baseline Pt. able to ambulate with use of RW/ CGA to min. A for safety.   Heavy use of B UE.; 9/30 pt able to ambulate with rollator with inconsistent 2 point gait pattern, heavy reliance on UE CGA/supervision  2/9: Pt. uses 2 point step pattern with walking with rollader with decreased stride length. Pt. would be limited due to pain in the knee walking for >100 ft.; 8/10: Pt demonstrates ability to ambulate 100' with consistent 2 point gait pattern with rollator. 11/18/20: Pt able to amb 130 ft with Rollator and 2-point gait pattern. 04/28/21: Pt able to amb 148 ft with Rollator and 2-point gait pattern    Time 8    Period Weeks    Status Achieved    Target Date 04/28/21      PT LONG TERM GOAL #4   Title Pt. able to stand from normal chair with no UE assist to improve safety/independence with transfers.     Baseline Unable to stand from standard chair without heavy UE assist. 10/6, pt unable to  stand from standard chair without UE support.  11/10: benefits from 1 UE assist  2/9: Pt. can rise from normal chair with 1 UE assist and must control descent with UE assist.; 8/10: Pt requires 2 UE for standing from standard chair height.; 10/5: Requires single UE to stand. 11/18/20: Pt requires B UE support.  04/28/21: Pt requires single UE support    Time 8    Period Weeks    Status Partially Met    Target Date 06/30/21      PT LONG TERM GOAL #5   Title Pt. will perform a sit to stand with 1 UE support to be able to perform toileting activities with more independance.    Baseline Pt. requires 2 UE to push from to sit to stand from standard chair height. Pt. has grab rails available but can not get up by pulling.; 8/10: pt requires 2 UE's to STS from standard chair height.; 10/5: Only required single UE support to stand. Use of momentum but able to perform independently.    Time 8    Period Weeks    Status Achieved      PT LONG TERM GOAL #6   Title Pt. will ascend/ descend 4 steps with use of B handrails and step pattern with mod. independence safely to be able to enter son's house.    Baseline Step pattern with heavy UE assist and CGA/min. A for safety.; 8/10: Pt safely asc/desc 4 steps with B hand rails and step to pattern with mod indep to enter son's house safely.    Time 0    Period Weeks    Status Achieved      PT LONG TERM GOAL #7   Title Pt will be able to stand at bathroom sink for 5 min with no UE support to complete ADL's like brushing his teeth, washing his hands, and combing his hair.    Baseline 8/10: Ability to stand with no UE support for 2 min.; 10/5: 3.5 min due to LBP. Has demonstrated in previous PT sessions he can stand for 5 min. 04/28/2021: 5 min.    Time 8    Period Weeks    Status Achieved      PT LONG TERM GOAL #8   Title Pt will demonstrate ability to stand from a lowered surface below 18" with single Ue support to demonstrate improved BLE strength to improve  transfers from toilet.    Baseline 10/5: Able to stand from 18" chair with single UE support with use of momentum. 11/18/20: able to after 3 attempts.    Time 8    Period Weeks    Status Partially Met    Target Date 06/30/21                   Plan - 05/26/21 1404     Clinical Impression Statement No manual tx. today.  Tx. focus on LE strengthening/ walking endurance with consistent recip. gait pattern while maintain a steady cadence in PT clinic.  Pt. able to ascend/ descend stairs with step pattern and no issues with hip flexion/ foot placement.  Pt. tends to decrease cadence while walking down ramp and on uneven curb.  Pt. able to correct gait/ recip. pattern with cuing but is very cautious with gait pattern.  No increase c/o R knee pain during tx. session today.  Pts. wife reports that pt. is maneuvering around bathroom/ commode with improved rollator control to maximize independence/ safety.    Personal Factors and Comorbidities Age    Examination-Activity Limitations Toileting;Stand;Squat;Lift;Stairs    Stability/Clinical Decision  Making Evolving/Moderate complexity    Clinical Decision Making Moderate    Rehab Potential Fair    PT Frequency 1x / week    PT Duration 8 weeks    PT Treatment/Interventions Moist Heat;Functional mobility training;Therapeutic activities;Therapeutic exercise;Balance training;Patient/family education;Manual techniques;Passive range of motion;Neuromuscular re-education;Stair training;Gait training;ADLs/Self Care Home Management    PT Next Visit Plan Future session should focus on stair negotiation tolerance to ensure safety getting in/out his sons house (7 steps).    PT Home Exercise Plan [email or web address]    Consulted and Agree with Plan of Care Patient;Family member/caregiver    Family Member Consulted spouse: [name]             Patient will benefit from skilled therapeutic intervention in order to improve the following deficits and impairments:  Abnormal  gait, Decreased endurance, Decreased activity tolerance, Pain, Decreased balance, Impaired flexibility, Decreased strength, Postural dysfunction  Visit Diagnosis: Muscle weakness (generalized)  Abnormal posture  Gait difficulty  Balance problem     Problem List Patient Active Problem List   Diagnosis Date Noted   DVT femoral (deep venous thrombosis) with thrombophlebitis, left (HCC) 02/06/2020   Lymphedema 04/15/2019   Bilateral leg edema 01/25/2019   Hyponatremia 12/24/2018   SIADH (syndrome of inappropriate ADH production) ([place]) 12/24/2018   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) 10/27/2018   Generalized weakness 10/14/2018   Hip fracture ([place]) 09/15/2018   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   HLD (hyperlipidemia) 03/24/2018   Contusion of right knee 02/14/2018   Bradycardia 12/28/2017   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   Tremor 09/04/2016   Chronic pain of right knee 08/10/2016   Right ankle pain 08/10/2016   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[place]) 12/11/2015   Urinary retention 09/08/2015   Lymphoma, 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[place]) 04/03/2015   Breathlessness on exertion 11/21/2014   Breath shortness 11/21/2014   Arthropathy 11/07/2014   Atrial flutter, paroxysmal ([place]) 11/07/2014   Type 2 diabetes mellitus ([place]) 11/07/2014   Benign essential tremor 11/07/2014   Benign essential HTN 11/07/2014   Mixed incontinence 11/07/2014   Hypercholesterolemia without hypertriglyceridemia 11/07/2014   Apnea, sleep 11/07/2014   Controlled type 2 diabetes mellitus without complication ([place]) 58/52/7782   Pure hypercholesterolemia 11/07/2014   Other abnormalities of gait and mobility 11/02/2011   Decreased mobility 11/02/2011   Abnormal gait 06/29/2011   Discoordination 06/29/2011   Pura Spice, PT,  DPT # [id number] 05/26/2021, 2:07 PM  [facility] [address] [place], [place], [address] Phone: [phone]   Fax:  [phone]  Name: Sakai Wolford MRN: [id number] Date of Birth: 01-06-1941

## 2021-06-02 ENCOUNTER — Other Ambulatory Visit: Payer: Self-pay

## 2021-06-02 ENCOUNTER — Encounter: Payer: Self-pay | Admitting: Physical Therapy

## 2021-06-02 ENCOUNTER — Ambulatory Visit: Payer: Medicare Other | Attending: Orthopedic Surgery | Admitting: Physical Therapy

## 2021-06-02 DIAGNOSIS — R269 Unspecified abnormalities of gait and mobility: Secondary | ICD-10-CM | POA: Insufficient documentation

## 2021-06-02 DIAGNOSIS — R2689 Other abnormalities of gait and mobility: Secondary | ICD-10-CM | POA: Insufficient documentation

## 2021-06-02 DIAGNOSIS — R293 Abnormal posture: Secondary | ICD-10-CM | POA: Insufficient documentation

## 2021-06-02 DIAGNOSIS — M6281 Muscle weakness (generalized): Secondary | ICD-10-CM

## 2021-06-02 NOTE — Therapy (Signed)
[facility] [address] [place], [place], [address] Phone: [phone]   Fax:  [phone]  Physical Therapy Treatment  Patient Details  Name: Mike Holloway MRN: [id number] Date of Birth: Mar 27, 1941 Referring Provider (PT): Dr. Rudene Christians   Encounter Date: 06/02/2021   PT End of Session - 06/03/21 0753     Visit Number 121    Number of Visits 125    Date for PT Re-Evaluation 06/30/21    Authorization - Visit Number 3    Authorization - Number of Visits 10    PT Start Time 3734    PT Stop Time 1401    PT Time Calculation (min) 58 min    Equipment Utilized During Treatment Gait belt    Activity Tolerance Patient tolerated treatment well;Patient limited by pain    Behavior During Therapy WFL for tasks assessed/performed             Past Medical History:  Diagnosis Date   Arthritis    Atrial flutter ([place])    Diabetes mellitus ([place])    Essential tremor    Essential tremor    deep brain stimulator    Hypercholesteremia    Hypertension    Incontinence    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   SIADH (syndrome of inappropriate ADH production) ([place])    Sleep apnea    Stroke [facility])     Past Surgical History:  Procedure Laterality Date   ABLATION     APPENDECTOMY     CATARACT EXTRACTION, BILATERAL     COLONOSCOPY WITH PROPOFOL N/A 03/17/2018   Procedure: COLONOSCOPY WITH PROPOFOL;  Surgeon: Jonathon Bellows, MD;  Location: [facility] 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   Subjective Assessment - 06/02/21 1311     Subjective Pt presents to tx with 1.5/10 R knee pain prior to PT tx. session.  No new complaints.  Pt. walked into [place] this Sunday but no other outdoor walking reported.    Patient is accompained by: Family member    Pertinent History Pt. states R knee is bothering him but it is the typical discomfort he experiences    Limitations House hold activities;Walking;Standing;Lifting    How long can you sit comfortably? 2 hours    How long can you stand comfortably? <10 min    How long can you walk comfortably? 10 min    Patient Stated Goals Improve LE strength/ gait and balance with daily tasks.  Improve gait/ safety/ progression to least assistive device.    Currently in Pain? Yes    Pain Score 2     Pain Location Knee    Pain Orientation Right    Pain Descriptors / Indicators Aching    Pain Onset More than a month  ago                Therex.  Standing 12" step touches 20x with B handrail assist.  4# ankle wts.: step to stairs with B UE assist/ standing hip abduction/ knee flexion/ seated LAQ (pain during LAQ in R quad/ knee extension)/ heel and toe raises 20x.   4# ankle wts: forward/ backwards walking in //-bars 2 laps x 2.  Heavy UE assist.  Seated rest breaks required due to LE muscle fatigue.    Nustep L4 10 min. B LE only (MH to back).     Neuro.mm.  Turning CW/CCW in //-bars (cuing to decrease UE assist on //-bars as tolerated).  Walking in PT gym with rollator and increase step length/ cuing for consistent heel strike.  Posture correction and mirror feedback.  No LOB/ no gait belt           PT Long Term Goals - 05/06/21 0853       PT LONG TERM GOAL #1   Title Pt. independent with HEP to increase B hip flexion/ R quad muscle  strength 1/2 muscle grade to improve pain-free mobility.    Baseline R knee extension limited to 4/5 MMT secondary to pain/ fear of pain. 2/9: Deferred.  4/13: 4+/5 MMT (max potential).    Time 4    Period Weeks    Status Achieved    Target Date 01/07/21      PT LONG TERM GOAL #2   Title Pt. will increase Berg balance test to >40 out of 56 to improve independence with gait/ decrease fall risk.    Baseline Berg: 22/56 (significant fall risk); 9/30 32/56.  11/10: 32 (limited by R knee pain)  2/9: 33/56 limited due to R knee and Low Back pain; 8/10: 29/56; 10/5: 38/56, 11/18/20: 28. 04/28/2021: 28/56    Time 8    Period Weeks    Status Partially Met    Target Date 06/30/21      PT LONG TERM GOAL #3   Title Pt. able to ambulate 100 feet with consistent 2 point gait pattern and use of least restrictive assistive devce to improve household mobility.    Baseline Pt. able to ambulate with use of RW/ CGA to min. A for safety.   Heavy use of B UE.; 9/30 pt able to ambulate with rollator with inconsistent 2 point gait pattern, heavy reliance on UE CGA/supervision  2/9: Pt. uses 2 point step pattern with walking with rollader with decreased stride length. Pt. would be limited due to pain in the knee walking for >100 ft.; 8/10: Pt demonstrates ability to ambulate 100' with consistent 2 point gait pattern with rollator. 11/18/20: Pt able to amb 130 ft with Rollator and 2-point gait pattern. 04/28/21: Pt able to amb 148 ft with Rollator and 2-point gait pattern    Time 8    Period Weeks    Status Achieved    Target Date 04/28/21      PT LONG TERM GOAL #4   Title Pt. able to stand from normal chair with no UE assist to improve safety/independence with transfers.     Baseline Unable to stand from standard chair without heavy UE assist. 10/6, pt unable to stand from standard chair without UE support.  11/10: benefits from 1 UE assist    2/9: Pt. can rise from normal chair with 1 UE assist and must control descent  with UE assist.; 8/10: Pt requires 2 UE for standing from  standard chair height.; 10/5: Requires single UE to stand. 11/18/20: Pt requires B UE support.  04/28/21: Pt requires single UE support    Time 8    Period Weeks    Status Partially Met    Target Date 06/30/21      PT LONG TERM GOAL #5   Title Pt. will perform a sit to stand with 1 UE support to be able to perform toileting activities with more independance.    Baseline Pt. requires 2 UE to push from to sit to stand from standard chair height. Pt. has grab rails available but can not get up by pulling.; 8/10: pt requires 2 UE's to STS from standard chair height.; 10/5: Only required single UE support to stand. Use of momentum but able to perform independently.    Time 8    Period Weeks    Status Achieved      PT LONG TERM GOAL #6   Title Pt. will ascend/ descend 4 steps with use of B handrails and step pattern with mod. independence safely to be able to enter son's house.    Baseline Step pattern with heavy UE assist and CGA/min. A for safety.; 8/10: Pt safely asc/desc 4 steps with B hand rails and step to pattern with mod indep to enter son's house safely.    Time 0    Period Weeks    Status Achieved      PT LONG TERM GOAL #7   Title Pt will be able to stand at bathroom sink for 5 min with no UE support to complete ADL's like brushing his teeth, washing his hands, and combing his hair.    Baseline 8/10: Ability to stand with no UE support for 2 min.; 10/5: 3.5 min due to LBP. Has demonstrated in previous PT sessions he can stand for 5 min. 04/28/2021: 5 min.    Time 8    Period Weeks    Status Achieved      PT LONG TERM GOAL #8   Title Pt will demonstrate ability to stand from a lowered surface below 18" with single Ue support to demonstrate improved BLE strength to improve transfers from toilet.    Baseline 10/5: Able to stand from 18" chair with single UE support with use of momentum. 11/18/20: able to after 3 attempts.    Time 8     Period Weeks    Status Partially Met    Target Date 06/30/21                   Plan - 06/03/21 0753     Clinical Impression Statement Moderate B LE muscle fatigue during resisted ther.ex./ stair ex.  Pt. requires heavy UE assist on handrails and occasional seated rest breaks secondary to R knee pain.  Pt. able to walk increase distances and stand longer with use of rollator during tx. session.  Pt. safe with all aspects of walking with no LOB or episodes of R knee buckling.  No gait belt required during tx. session and pt. works hard duirng standing/ seated resisted ther.ex.  No change to HEP and pt. instructed to increase walking distance on a daily basis at home.    Personal Factors and Comorbidities Age    Examination-Activity Limitations Toileting;Stand;Squat;Lift;Stairs    Stability/Clinical Decision Making Evolving/Moderate complexity    Clinical Decision Making Moderate    Rehab Potential Fair    PT Frequency 1x / week    PT Duration 8 weeks  PT Treatment/Interventions Moist Heat;Functional mobility training;Therapeutic activities;Therapeutic exercise;Balance training;Patient/family education;Manual techniques;Passive range of motion;Neuromuscular re-education;Stair training;Gait training;ADLs/Self Care Home Management    PT Next Visit Plan Future session should focus on stair negotiation tolerance to ensure safety getting in/out his sons house (7 steps).    PT Home Exercise Plan [email or web address]    Consulted and Agree with Plan of Care Patient;Family member/caregiver    Family Member Consulted spouse: [name]             Patient will benefit from skilled therapeutic intervention in order to improve the following deficits and impairments:  Abnormal gait, Decreased endurance, Decreased activity tolerance, Pain, Decreased balance, Impaired flexibility, Decreased strength, Postural dysfunction  Visit Diagnosis: Muscle weakness (generalized)  Abnormal posture  Gait  difficulty  Balance problem     Problem List Patient Active Problem List   Diagnosis Date Noted   DVT femoral (deep venous thrombosis) with thrombophlebitis, left (HCC) 02/06/2020   Lymphedema 04/15/2019   Bilateral leg edema 01/25/2019   Hyponatremia 12/24/2018   SIADH (syndrome of inappropriate ADH production) ([place]) 12/24/2018   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) 10/27/2018   Generalized weakness 10/14/2018   Hip fracture ([place]) 09/15/2018   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   HLD (hyperlipidemia) 03/24/2018   Contusion of right knee 02/14/2018   Bradycardia 12/28/2017   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   Tremor 09/04/2016   Chronic pain of right knee 08/10/2016   Right ankle pain 08/10/2016   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[place]) 12/11/2015   Urinary retention 09/08/2015   Lymphoma, 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[place]) 04/03/2015   Breathlessness on exertion 11/21/2014   Breath shortness 11/21/2014   Arthropathy 11/07/2014   Atrial flutter, paroxysmal ([place]) 11/07/2014   Type 2 diabetes mellitus ([place]) 11/07/2014   Benign essential tremor 11/07/2014   Benign essential HTN 11/07/2014   Mixed incontinence 11/07/2014   Hypercholesterolemia without hypertriglyceridemia 11/07/2014   Apnea, sleep 11/07/2014   Controlled type 2 diabetes mellitus without complication ([place] 52/04/222   Pure hypercholesterolemia 11/07/2014   Other abnormalities of gait and mobility 11/02/2011   Decreased mobility 11/02/2011   Abnormal gait 06/29/2011   Discoordination 06/29/2011   Pura Spice, PT, DPT # [id number] 06/03/2021, 8:10 AM  [facility] [address] [place], [place], [address] Phone: [phone]   Fax:  [phone]  Name: Fed Ceci MRN: [id number] Date  of Birth: 1941/07/09

## 2021-06-04 ENCOUNTER — Other Ambulatory Visit: Payer: Self-pay

## 2021-06-04 ENCOUNTER — Ambulatory Visit (INDEPENDENT_AMBULATORY_CARE_PROVIDER_SITE_OTHER): Payer: Medicare Other | Admitting: Physician Assistant

## 2021-06-04 DIAGNOSIS — N3289 Other specified disorders of bladder: Secondary | ICD-10-CM | POA: Diagnosis not present

## 2021-06-04 MED ORDER — MIRABEGRON ER 25 MG PO TB24
25.0000 mg | ORAL_TABLET | Freq: Every day | ORAL | 11 refills | Status: DC
Start: 2021-06-04 — End: 2022-06-10

## 2021-06-04 NOTE — Progress Notes (Signed)
Cath Change/ Replacement  Patient is present today for a catheter change due to urinary retention.  47m of water was removed from the balloon, a 16FR coude foley cath was removed without difficulty.  Patient was cleaned and prepped in a sterile fashion with betadine and 2% lidocaine jelly was instilled into the urethra. A 16 FR coude foley cath was replaced into the bladder no complications were noted Urine return was noted 147mand urine was yellow in color. The balloon was filled with 1016mf sterile water. A leg bag was attached for drainage.  A night bag was also given to the patient. Patient tolerated well.  Performed by: SamDebroah LoopA-C   Additional notes: Bladder spasms resolved on Myrbetriq '25mg'$ . Prescription for 1 year sent to pharmacy today.  Follow up: Return in about 4 weeks (around 07/02/2021) for Catheter exchange.

## 2021-06-09 ENCOUNTER — Other Ambulatory Visit: Payer: Self-pay

## 2021-06-09 ENCOUNTER — Encounter: Payer: Self-pay | Admitting: Physical Therapy

## 2021-06-09 ENCOUNTER — Ambulatory Visit: Payer: Medicare Other | Admitting: Physical Therapy

## 2021-06-09 DIAGNOSIS — R269 Unspecified abnormalities of gait and mobility: Secondary | ICD-10-CM

## 2021-06-09 DIAGNOSIS — M6281 Muscle weakness (generalized): Secondary | ICD-10-CM | POA: Diagnosis not present

## 2021-06-09 DIAGNOSIS — R293 Abnormal posture: Secondary | ICD-10-CM

## 2021-06-09 DIAGNOSIS — R2689 Other abnormalities of gait and mobility: Secondary | ICD-10-CM

## 2021-06-09 NOTE — Therapy (Signed)
[facility] [address] [place], [place], [address] Phone: [phone]   Fax:  [phone]  Physical Therapy Treatment  Patient Details  Name: Mike Holloway MRN: [id number] Date of Birth: [birth year] Referring Provider (PT): Dr. Rudene Christians   Encounter Date: 06/09/2021   PT End of Session - 06/09/21 1553     Visit Number 122    Number of Visits 125    Date for PT Re-Evaluation 06/30/21    Authorization - Visit Number 4    Authorization - Number of Visits 10    PT Start Time 1062    PT Stop Time 1359    PT Time Calculation (min) 61 min    Equipment Utilized During Treatment Gait belt    Activity Tolerance Patient tolerated treatment well;Patient limited by pain    Behavior During Therapy WFL for tasks assessed/performed             Past Medical History:  Diagnosis Date   Arthritis    Atrial flutter ([place])    Diabetes mellitus ([place])    Essential tremor    Essential tremor    deep brain stimulator    Hypercholesteremia    Hypertension    Incontinence    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   SIADH (syndrome of inappropriate ADH production) ([place])    Sleep apnea    Stroke [facility])     Past Surgical History:  Procedure Laterality Date   ABLATION     APPENDECTOMY     CATARACT EXTRACTION, BILATERAL     COLONOSCOPY WITH PROPOFOL N/A 03/17/2018   Procedure: COLONOSCOPY WITH PROPOFOL;  Surgeon: Jonathon Bellows, MD;  Location: [facility] 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   Subjective Assessment - 06/09/21 1550     Subjective Pt states he is doing well.  R knee pain remains with standing/ walking tasks.  Pt. reports walking around home and into [place] this past weekend.    Patient is accompained by: Family member    Pertinent History Pt. states R knee is bothering him but it is the typical discomfort he experiences    Limitations House hold activities;Walking;Standing;Lifting    How long can you sit comfortably? 2 hours    How long can you stand comfortably? <10 min    How long can you walk comfortably? 10 min    Patient Stated Goals Improve LE strength/ gait and balance with daily tasks.  Improve gait/ safety/ progression to least assistive device.    Currently in Pain? Yes    Pain Score 2     Pain Location Knee    Pain Orientation Right    Pain Descriptors / Indicators Aching;Constant    Pain Type Chronic pain    Pain Onset More  than a month ago               Neuro:  Walking in clinic/ cone taps in //-bars with mirror feedback and heavy B UE assist (forward and lateral 2 laps each).    6" step ups in //-bars 12x  Resisted gait with 2BTB 4x all 4-planes in //-bars with UE assist required.    Walking in //-bars with increase hip flexion/ knee flexion with posture correction/ mirror feedback 4 laps.    Walking from Nustep to car with rollator and descending ramp.  Cuing to increase step length and prevent shuffling/ short gait pattern.     There.ex.:  Sit to stands from gray chair 10x.  Seated/ standing hip and knee ex. (Reviewed HEP)   Nustep L5 10 min. B UE/LE with MH to low back        PT Long Term Goals - 05/06/21 0853       PT LONG TERM GOAL #1   Title Pt. independent with HEP to increase B hip flexion/ R quad muscle strength 1/2  muscle grade to improve pain-free mobility.    Baseline R knee extension limited to 4/5 MMT secondary to pain/ fear of pain. 2/9: Deferred.  4/13: 4+/5 MMT (max potential).    Time 4    Period Weeks    Status Achieved    Target Date 01/07/21      PT LONG TERM GOAL #2   Title Pt. will increase Berg balance test to >40 out of 56 to improve independence with gait/ decrease fall risk.    Baseline Berg: 22/56 (significant fall risk); 9/30 32/56.  11/10: 32 (limited by R knee pain)  2/9: 33/56 limited due to R knee and Low Back pain; 8/10: 29/56; 10/5: 38/56, 11/18/20: 28. 04/28/2021: 28/56    Time 8    Period Weeks    Status Partially Met    Target Date 06/30/21      PT LONG TERM GOAL #3   Title Pt. able to ambulate 100 feet with consistent 2 point gait pattern and use of least restrictive assistive devce to improve household mobility.    Baseline Pt. able to ambulate with use of RW/ CGA to min. A for safety.   Heavy use of B UE.; 9/30 pt able to ambulate with rollator with inconsistent 2 point gait pattern, heavy reliance on UE CGA/supervision  2/9: Pt. uses 2 point step pattern with walking with rollader with decreased stride length. Pt. would be limited due to pain in the knee walking for >100 ft.; 8/10: Pt demonstrates ability to ambulate 100' with consistent 2 point gait pattern with rollator. 11/18/20: Pt able to amb 130 ft with Rollator and 2-point gait pattern. 04/28/21: Pt able to amb 148 ft with Rollator and 2-point gait pattern    Time 8    Period Weeks    Status Achieved    Target Date 04/28/21      PT LONG TERM GOAL #4   Title Pt. able to stand from normal chair with no UE assist to improve safety/independence with transfers.     Baseline Unable to stand from standard chair without heavy UE assist. 10/6, pt unable to stand from standard chair without UE support.  11/10: benefits from 1 UE assist    2/9: Pt. can rise from normal chair with 1 UE assist and must control descent with UE  assist.; 8/10: Pt requires 2 UE for standing from standard chair height.; 10/5: Requires single  UE to stand. 11/18/20: Pt requires B UE support.  04/28/21: Pt requires single UE support    Time 8    Period Weeks    Status Partially Met    Target Date 06/30/21      PT LONG TERM GOAL #5   Title Pt. will perform a sit to stand with 1 UE support to be able to perform toileting activities with more independance.    Baseline Pt. requires 2 UE to push from to sit to stand from standard chair height. Pt. has grab rails available but can not get up by pulling.; 8/10: pt requires 2 UE's to STS from standard chair height.; 10/5: Only required single UE support to stand. Use of momentum but able to perform independently.    Time 8    Period Weeks    Status Achieved      PT LONG TERM GOAL #6   Title Pt. will ascend/ descend 4 steps with use of B handrails and step pattern with mod. independence safely to be able to enter son's house.    Baseline Step pattern with heavy UE assist and CGA/min. A for safety.; 8/10: Pt safely asc/desc 4 steps with B hand rails and step to pattern with mod indep to enter son's house safely.    Time 0    Period Weeks    Status Achieved      PT LONG TERM GOAL #7   Title Pt will be able to stand at bathroom sink for 5 min with no UE support to complete ADL's like brushing his teeth, washing his hands, and combing his hair.    Baseline 8/10: Ability to stand with no UE support for 2 min.; 10/5: 3.5 min due to LBP. Has demonstrated in previous PT sessions he can stand for 5 min. 04/28/2021: 5 min.    Time 8    Period Weeks    Status Achieved      PT LONG TERM GOAL #8   Title Pt will demonstrate ability to stand from a lowered surface below 18" with single Ue support to demonstrate improved BLE strength to improve transfers from toilet.    Baseline 10/5: Able to stand from 18" chair with single UE support with use of momentum. 11/18/20: able to after 3 attempts.    Time 8    Period  Weeks    Status Partially Met    Target Date 06/30/21                   Plan - 06/09/21 1555     Clinical Impression Statement PT tx. focused on increase standing/walking tolerance before seated rest break.  Pt. able to complete walking around PT clinic/ cone taps/ resisted walking prior to a seated rest break.  3 seated rest breaks taken during tx. session prior to ending tx. on Nustep machine.    Pt. able to step on 6" step with L LE and good quad control but unable to attempt R LE secondary to R knee pain.  Pt. continues to require heavy UE assist with all standing tasks in //-bars and walking with rollator.  Pt. will focus on walking an increase distance at home with rollator and increase hip flexion/ step pattern/ heel strike.    Personal Factors and Comorbidities Age    Examination-Activity Limitations Toileting;Stand;Squat;Lift;Stairs    Stability/Clinical Decision Making Evolving/Moderate complexity    Clinical Decision Making Moderate    Rehab Potential Fair    PT Frequency 1x /  week    PT Duration 8 weeks    PT Treatment/Interventions Moist Heat;Functional mobility training;Therapeutic activities;Therapeutic exercise;Balance training;Patient/family education;Manual techniques;Passive range of motion;Neuromuscular re-education;Stair training;Gait training;ADLs/Self Care Home Management    PT Next Visit Plan Complete increase stair training/ repetitions and standing endurance.    PT Home Exercise Plan [email or web address]    Consulted and Agree with Plan of Care Patient;Family member/caregiver    Family Member Consulted spouse: [name]             Patient will benefit from skilled therapeutic intervention in order to improve the following deficits and impairments:  Abnormal gait, Decreased endurance, Decreased activity tolerance, Pain, Decreased balance, Impaired flexibility, Decreased strength, Postural dysfunction  Visit Diagnosis: Muscle weakness (generalized)  Abnormal  posture  Gait difficulty  Balance problem     Problem List Patient Active Problem List   Diagnosis Date Noted   DVT femoral (deep venous thrombosis) with thrombophlebitis, left (HCC) 02/06/2020   Lymphedema 04/15/2019   Bilateral leg edema 01/25/2019   Hyponatremia 12/24/2018   SIADH (syndrome of inappropriate ADH production) ([place]) 12/24/2018   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) 10/27/2018   Generalized weakness 10/14/2018   Hip fracture ([place]) 09/15/2018   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   HLD (hyperlipidemia) 03/24/2018   Contusion of right knee 02/14/2018   Bradycardia 12/28/2017   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   Tremor 09/04/2016   Chronic pain of right knee 08/10/2016   Right ankle pain 08/10/2016   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[place]) 12/11/2015   Urinary retention 09/08/2015   Lymphoma, 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[place]) 04/03/2015   Breathlessness on exertion 11/21/2014   Breath shortness 11/21/2014   Arthropathy 11/07/2014   Atrial flutter, paroxysmal ([place]) 11/07/2014   Type 2 diabetes mellitus ([place]) 11/07/2014   Benign essential tremor 11/07/2014   Benign essential HTN 11/07/2014   Mixed incontinence 11/07/2014   Hypercholesterolemia without hypertriglyceridemia 11/07/2014   Apnea, sleep 11/07/2014   Controlled type 2 diabetes mellitus without complication ([place]) 05/39/7673   Pure hypercholesterolemia 11/07/2014   Other abnormalities of gait and mobility 11/02/2011   Decreased mobility 11/02/2011   Abnormal gait 06/29/2011   Discoordination 06/29/2011   Pura Spice, PT, DPT # [id number] 06/09/2021, 4:07 PM  [facility] [address] [place], [place], [address] Phone: [phone]   Fax:  [phone]  Name: Mike Holloway MRN: [id number] Date of Birth: 15-Mar-1941

## 2021-06-16 ENCOUNTER — Encounter: Payer: Self-pay | Admitting: Physical Therapy

## 2021-06-16 ENCOUNTER — Other Ambulatory Visit: Payer: Self-pay

## 2021-06-16 ENCOUNTER — Ambulatory Visit: Payer: Medicare Other | Admitting: Physical Therapy

## 2021-06-16 DIAGNOSIS — R269 Unspecified abnormalities of gait and mobility: Secondary | ICD-10-CM

## 2021-06-16 DIAGNOSIS — R2689 Other abnormalities of gait and mobility: Secondary | ICD-10-CM

## 2021-06-16 DIAGNOSIS — M6281 Muscle weakness (generalized): Secondary | ICD-10-CM | POA: Diagnosis not present

## 2021-06-16 DIAGNOSIS — R293 Abnormal posture: Secondary | ICD-10-CM

## 2021-06-16 NOTE — Therapy (Signed)
[facility] [address] [place], [place], [address] Phone: [phone]   Fax:  [phone]  Physical Therapy Treatment  Patient Details  Name: Mike Holloway MRN: [id number] Date of Birth: 1941/07/14 Referring Provider (PT): Dr. Rudene Christians   Encounter Date: 06/16/2021   Treatment: 256 to 125.  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   SIADH (syndrome of inappropriate ADH production) ([place])    Sleep apnea    Stroke [facility])     Past Surgical History:  Procedure Laterality Date   ABLATION     APPENDECTOMY     CATARACT EXTRACTION, BILATERAL     COLONOSCOPY WITH PROPOFOL N/A 03/17/2018   Procedure: COLONOSCOPY WITH PROPOFOL;  Surgeon: Jonathon Bellows, MD;  Location: [facility] 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   Pt. reports 1/10 R knee pain currently in seated position. Pt. states his R knee buckled while stepping in bathroom and did not fall. Pt. states he is very fearful of falling and requires heavy UE  assist.h       Neuro:  Walking in //-bars (forward/ backwards/lateral) with B UE assist 3x each.  Pharmacist, hospital.   6" and 12" hurdles recip. Step overs in //-bars.  Able to complete 6" with ankle wts. But no 12"  6" step ups/ down in //-bars with L LE.  Pain limited with R LE step ups due to R knee jt. Pain.  Standing wt. Shifting in //-bars with lighter UE assist (CGA for safety).    Walking outside on varying terrain/ stepping down curb with rollator and proper technique.  Extra time required.  Good safety awareness.     There.ex.:   5# Seated/ standing therex. In //-bars (hip flexion/ abduction/ heel raises/ LAQ)- 20x each.  Reassessment of cervical/ B shoulder AROM.  Significant B UT muscle tightness.  Use of Hypervolt to B UT region.    Nustep L4 10 min. B LE (consistent cadence)- no rest breaks.  MH to low back        PT Long Term Goals - 05/06/21 0853       PT LONG TERM GOAL #1   Title Pt. independent with HEP to increase B hip flexion/ R quad muscle strength 1/2 muscle grade to improve pain-free mobility.    Baseline R knee extension limited to 4/5 MMT secondary to pain/ fear of pain. 2/9: Deferred.  4/13: 4+/5 MMT (max potential).  Time 4    Period Weeks    Status Achieved    Target Date 01/07/21      PT LONG TERM GOAL #2   Title Pt. will increase Berg balance test to >40 out of 56 to improve independence with gait/ decrease fall risk.    Baseline Berg: 22/56 (significant fall risk); 9/30 32/56.  11/10: 32 (limited by R knee pain)  2/9: 33/56 limited due to R knee and Low Back pain; 8/10: 29/56; 10/5: 38/56, 11/18/20: 28. 04/28/2021: 28/56    Time 8    Period Weeks    Status Partially Met    Target Date 06/30/21      PT LONG TERM GOAL #3   Title Pt. able to ambulate 100 feet with consistent 2 point gait pattern and use of least restrictive assistive devce to improve household mobility.    Baseline Pt. able to ambulate with use of RW/ CGA to min. A  for safety.   Heavy use of B UE.; 9/30 pt able to ambulate with rollator with inconsistent 2 point gait pattern, heavy reliance on UE CGA/supervision  2/9: Pt. uses 2 point step pattern with walking with rollader with decreased stride length. Pt. would be limited due to pain in the knee walking for >100 ft.; 8/10: Pt demonstrates ability to ambulate 100' with consistent 2 point gait pattern with rollator. 11/18/20: Pt able to amb 130 ft with Rollator and 2-point gait pattern. 04/28/21: Pt able to amb 148 ft with Rollator and 2-point gait pattern    Time 8    Period Weeks    Status Achieved    Target Date 04/28/21      PT LONG TERM GOAL #4   Title Pt. able to stand from normal chair with no UE assist to improve safety/independence with transfers.     Baseline Unable to stand from standard chair without heavy UE assist. 10/6, pt unable to stand from standard chair without UE support.  11/10: benefits from 1 UE assist    2/9: Pt. can rise from normal chair with 1 UE assist and must control descent with UE assist.; 8/10: Pt requires 2 UE for standing from standard chair height.; 10/5: Requires single UE to stand. 11/18/20: Pt requires B UE support.  04/28/21: Pt requires single UE support    Time 8    Period Weeks    Status Partially Met    Target Date 06/30/21      PT LONG TERM GOAL #5   Title Pt. will perform a sit to stand with 1 UE support to be able to perform toileting activities with more independance.    Baseline Pt. requires 2 UE to push from to sit to stand from standard chair height. Pt. has grab rails available but can not get up by pulling.; 8/10: pt requires 2 UE's to STS from standard chair height.; 10/5: Only required single UE support to stand. Use of momentum but able to perform independently.    Time 8    Period Weeks    Status Achieved      PT LONG TERM GOAL #6   Title Pt. will ascend/ descend 4 steps with use of B handrails and step pattern with mod. independence safely to be able to  enter son's house.    Baseline Step pattern with heavy UE assist and CGA/min. A for safety.; 8/10: Pt safely asc/desc 4 steps with B hand rails and step to pattern with mod indep to enter son's house  safely.    Time 0    Period Weeks    Status Achieved      PT LONG TERM GOAL #7   Title Pt will be able to stand at bathroom sink for 5 min with no UE support to complete ADL's like brushing his teeth, washing his hands, and combing his hair.    Baseline 8/10: Ability to stand with no UE support for 2 min.; 10/5: 3.5 min due to LBP. Has demonstrated in previous PT sessions he can stand for 5 min. 04/28/2021: 5 min.    Time 8    Period Weeks    Status Achieved      PT LONG TERM GOAL #8   Title Pt will demonstrate ability to stand from a lowered surface below 18" with single Ue support to demonstrate improved BLE strength to improve transfers from toilet.    Baseline 10/5: Able to stand from 18" chair with single UE support with use of momentum. 11/18/20: able to after 3 attempts.    Time 8    Period Weeks    Status Partially Met    Target Date 06/30/21             Pt. unable to complete 12" step overs with 5# ankle wts. but completed with heavy UE assist without ankle wts. Pt. had no episodes of R knee buckling and pain score remained <2/10 during tx. session. Pt. continues to require 3-4 rest breaks during tx. sessions due to fatigue/ increase low back/ R knee pain. Pts. pain resolves quickly with seated rest breaks/ use of MH to low back.       Patient will benefit from skilled therapeutic intervention in order to improve the following deficits and impairments:  Abnormal gait, Decreased endurance, Decreased activity tolerance, Pain, Decreased balance, Impaired flexibility, Decreased strength, Postural dysfunction  Visit Diagnosis: Muscle weakness (generalized)  Abnormal posture  Gait difficulty  Balance problem     Problem List Patient Active Problem List   Diagnosis Date  Noted   DVT femoral (deep venous thrombosis) with thrombophlebitis, left (HCC) 02/06/2020   Lymphedema 04/15/2019   Bilateral leg edema 01/25/2019   Hyponatremia 12/24/2018   SIADH (syndrome of inappropriate ADH production) ([place]) 12/24/2018   Erosion of urethra due to catheterization of urinary tract ([place]) 10/27/2018   Generalized weakness 10/14/2018   Hip fracture ([place]) 09/15/2018   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   HLD (hyperlipidemia) 03/24/2018   Contusion of right knee 02/14/2018   Bradycardia 12/28/2017   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   Tremor 09/04/2016   Chronic pain of right knee 08/10/2016   Right ankle pain 08/10/2016   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[place]) 12/11/2015   Urinary retention 09/08/2015   Lymphoma, 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[place]) 04/03/2015   Breathlessness on exertion 11/21/2014   Breath shortness 11/21/2014   Arthropathy 11/07/2014   Atrial flutter, paroxysmal ([place]) 11/07/2014   Type 2 diabetes mellitus ([place]) 11/07/2014   Benign essential tremor 11/07/2014   Benign essential HTN 11/07/2014   Mixed incontinence 11/07/2014   Hypercholesterolemia without hypertriglyceridemia 11/07/2014   Apnea, sleep 11/07/2014   Controlled type 2 diabetes mellitus without complication ([place]) 04/88/8916   Pure hypercholesterolemia 11/07/2014   Other abnormalities of gait and mobility 11/02/2011   Decreased mobility 11/02/2011   Abnormal gait 06/29/2011   Discoordination 06/29/2011   Pura Spice, PT, DPT # [id number] 06/18/2021, 12:15 PM  [facility] [address] [place], [place], [address] Phone: [phone]   Fax:  [phone]  Name: Daryel Kenneth MRN: [id number] Date of Birth: Sep 14, 1941

## 2021-06-23 ENCOUNTER — Other Ambulatory Visit: Payer: Self-pay

## 2021-06-23 ENCOUNTER — Ambulatory Visit: Payer: Medicare Other | Admitting: Physical Therapy

## 2021-06-23 DIAGNOSIS — M6281 Muscle weakness (generalized): Secondary | ICD-10-CM | POA: Diagnosis not present

## 2021-06-23 DIAGNOSIS — R269 Unspecified abnormalities of gait and mobility: Secondary | ICD-10-CM

## 2021-06-23 DIAGNOSIS — R293 Abnormal posture: Secondary | ICD-10-CM

## 2021-06-23 DIAGNOSIS — R2689 Other abnormalities of gait and mobility: Secondary | ICD-10-CM

## 2021-06-24 ENCOUNTER — Encounter: Payer: Self-pay | Admitting: Physical Therapy

## 2021-06-24 NOTE — Therapy (Signed)
[facility] [address]. [place], [place], [address] Phone: [phone]   Fax:  [phone]  Physical Therapy Treatment  Patient Details  Name: Mike Holloway MRN: [id number] Date of Birth: [birth year] Referring Provider (PT): Dr. Rudene Christians   Encounter Date: 06/23/2021   PT End of Session - 06/24/21 0805     Visit Number 124    Number of Visits 125    Date for PT Re-Evaluation 06/30/21    Authorization - Visit Number 6    Authorization - Number of Visits 10    PT Start Time 7681    PT Stop Time 1405    PT Time Calculation (min) 67 min    Equipment Utilized During Treatment Gait belt    Activity Tolerance Patient tolerated treatment well;Patient limited by pain    Behavior During Therapy WFL for tasks assessed/performed             Past Medical History:  Diagnosis Date   Arthritis    Atrial flutter ([place])    Diabetes mellitus ([place])    Essential tremor    Essential tremor    deep brain stimulator    Hypercholesteremia    Hypertension    Incontinence    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   Procedure: FECAL TRANSPLANT;  Surgeon: Jonathon Bellows, MD;  Location: [facility] ENDOSCOPY;  Service: Gastroenterology;  Laterality: N/A;   HEMORRHOID SURGERY     HERNIA REPAIR     HIP FRACTURE SURGERY     INTRAMEDULLARY (IM) NAIL INTERTROCHANTERIC Right 09/16/2018   Procedure: INTRAMEDULLARY (IM) NAIL INTERTROCHANTRIC;  Surgeon: Hessie Knows, MD;  Location: [facility] ORS;   Service: Orthopedics;  Laterality: Right;   IR CATHETER TUBE CHANGE  11/22/2018   NASAL SINUS SURGERY     ORCHIECTOMY     TONSILLECTOMY      There were no vitals filed for this visit.   Subjective Assessment - 06/24/21 0757     Subjective Pt. reports no new complaints.  Pts. son was over this past weekend and he did more walking at [facility] with rollator assist.    Patient is accompained by: Family member    Pertinent History Pt. states R knee is bothering him but it is the typical discomfort he experiences    Limitations House hold activities;Walking;Standing;Lifting    How long can you sit comfortably? 2 hours    How long can you stand comfortably? <10 min    How long can you walk comfortably? 10 min    Patient Stated Goals Improve LE strength/ gait and balance with daily tasks.  Improve gait/ safety/ progression to least assistive device.    Currently in Pain? Yes    Pain Score 2     Pain Location Knee    Pain Orientation Right    Pain Descriptors / Indicators Aching    Pain Type Chronic pain    Pain Onset More than a month ago  Neuro:  Ascending/ descending stairs with step to gait pattern (4 steps x 3).     Walking in //-bars (forward/ backwards/lateral) with B UE assist 3x each.  Pharmacist, hospital.      Standing wt. Shifting in //-bars with lighter UE assist (CGA for safety).     Walking outside on varying terrain/ stepping down curb with rollator and proper technique.  Extra time required.  Good safety awareness.   Standing fishing in //-bars with no UE assist (5.5 minute tolerance)- slight increase in back discomfort.    Tandem gait in //-bars with heavy UE assist.        There.ex.:    5# Seated/ standing therex. In //-bars (hip flexion/ abduction/ heel raises/ LAQ)- 20x each.   Standing 5#: 6" and 12" step touches with B UE support, attempted single UE assist.    Use of Hypervolt to B UT region.     Nustep L4 10 min. B LE (consistent  cadence)- no rest breaks.  MH to low back           PT Long Term Goals - 05/06/21 0853       PT LONG TERM GOAL #1   Title Pt. independent with HEP to increase B hip flexion/ R quad muscle strength 1/2 muscle grade to improve pain-free mobility.    Baseline R knee extension limited to 4/5 MMT secondary to pain/ fear of pain. 2/9: Deferred.  4/13: 4+/5 MMT (max potential).    Time 4    Period Weeks    Status Achieved    Target Date 01/07/21      PT LONG TERM GOAL #2   Title Pt. will increase Berg balance test to >40 out of 56 to improve independence with gait/ decrease fall risk.    Baseline Berg: 22/56 (significant fall risk); 9/30 32/56.  11/10: 32 (limited by R knee pain)  2/9: 33/56 limited due to R knee and Low Back pain; 8/10: 29/56; 10/5: 38/56, 11/18/20: 28. 04/28/2021: 28/56    Time 8    Period Weeks    Status Partially Met    Target Date 06/30/21      PT LONG TERM GOAL #3   Title Pt. able to ambulate 100 feet with consistent 2 point gait pattern and use of least restrictive assistive devce to improve household mobility.    Baseline Pt. able to ambulate with use of RW/ CGA to min. A for safety.   Heavy use of B UE.; 9/30 pt able to ambulate with rollator with inconsistent 2 point gait pattern, heavy reliance on UE CGA/supervision  2/9: Pt. uses 2 point step pattern with walking with rollader with decreased stride length. Pt. would be limited due to pain in the knee walking for >100 ft.; 8/10: Pt demonstrates ability to ambulate 100' with consistent 2 point gait pattern with rollator. 11/18/20: Pt able to amb 130 ft with Rollator and 2-point gait pattern. 04/28/21: Pt able to amb 148 ft with Rollator and 2-point gait pattern    Time 8    Period Weeks    Status Achieved    Target Date 04/28/21      PT LONG TERM GOAL #4   Title Pt. able to stand from normal chair with no UE assist to improve safety/independence with transfers.     Baseline Unable to stand from standard chair  without heavy UE assist. 10/6, pt unable to stand from standard chair without UE support.  11/10: benefits from 1 UE assist  2/9: Pt. can rise from normal chair with 1 UE assist and must control descent with UE assist.; 8/10: Pt requires 2 UE for standing from standard chair height.; 10/5: Requires single UE to stand. 11/18/20: Pt requires B UE support.  04/28/21: Pt requires single UE support    Time 8    Period Weeks    Status Partially Met    Target Date 06/30/21      PT LONG TERM GOAL #5   Title Pt. will perform a sit to stand with 1 UE support to be able to perform toileting activities with more independance.    Baseline Pt. requires 2 UE to push from to sit to stand from standard chair height. Pt. has grab rails available but can not get up by pulling.; 8/10: pt requires 2 UE's to STS from standard chair height.; 10/5: Only required single UE support to stand. Use of momentum but able to perform independently.    Time 8    Period Weeks    Status Achieved      PT LONG TERM GOAL #6   Title Pt. will ascend/ descend 4 steps with use of B handrails and step pattern with mod. independence safely to be able to enter son's house.    Baseline Step pattern with heavy UE assist and CGA/min. A for safety.; 8/10: Pt safely asc/desc 4 steps with B hand rails and step to pattern with mod indep to enter son's house safely.    Time 0    Period Weeks    Status Achieved      PT LONG TERM GOAL #7   Title Pt will be able to stand at bathroom sink for 5 min with no UE support to complete ADL's like brushing his teeth, washing his hands, and combing his hair.    Baseline 8/10: Ability to stand with no UE support for 2 min.; 10/5: 3.5 min due to LBP. Has demonstrated in previous PT sessions he can stand for 5 min. 04/28/2021: 5 min.    Time 8    Period Weeks    Status Achieved      PT LONG TERM GOAL #8   Title Pt will demonstrate ability to stand from a lowered surface below 18" with single Ue support to  demonstrate improved BLE strength to improve transfers from toilet.    Baseline 10/5: Able to stand from 18" chair with single UE support with use of momentum. 11/18/20: able to after 3 attempts.    Time 8    Period Weeks    Status Partially Met    Target Date 06/30/21                   Plan - 06/24/21 0805     Clinical Impression Statement PT treatment focused on LE strengthening/ standing endurance with less UE assist at stairs and in //-bars.  Pt. able to ascend/ descend 4 stairs x 3 with mod. I safely (extra time and step to gait).  Pt. works hard during tx. session with LE resisted ther.ex. to increase strength of B hip flexors/ abductors to promote increase standing endurance/ walking distance.  No episodes of knee buckling but slight increase in R knee and low back pain.  Pts. pain symptoms resolve quickly with seated rest breaks and back support of chair.  Good recip. coordination with alt. UE/ LE touches in //-bars with 1 UE assist required at all times for balance/ safety.  Pt. has 1 more appt. scheduled  before recert vs. discharge.    Personal Factors and Comorbidities Age    Examination-Activity Limitations Toileting;Stand;Squat;Lift;Stairs    Stability/Clinical Decision Making Evolving/Moderate complexity    Clinical Decision Making Moderate    Rehab Potential Fair    PT Frequency 1x / week    PT Duration 8 weeks    PT Treatment/Interventions Moist Heat;Functional mobility training;Therapeutic activities;Therapeutic exercise;Balance training;Patient/family education;Manual techniques;Passive range of motion;Neuromuscular re-education;Stair training;Gait training;ADLs/Self Care Home Management    PT Next Visit Plan Complete increase stair training/ repetitions and standing endurance.    PT Home Exercise Plan [email or web address]    Consulted and Agree with Plan of Care Patient;Family member/caregiver    Family Member Consulted spouse: [name]             Patient will benefit from  skilled therapeutic intervention in order to improve the following deficits and impairments:  Abnormal gait, Decreased endurance, Decreased activity tolerance, Pain, Decreased balance, Impaired flexibility, Decreased strength, Postural dysfunction  Visit Diagnosis: Muscle weakness (generalized)  Abnormal posture  Gait difficulty  Balance problem     Problem List Patient Active Problem List   Diagnosis Date Noted   DVT femoral (deep venous thrombosis) with thrombophlebitis, left (HCC) 02/06/2020   Lymphedema 04/15/2019   Bilateral leg edema 01/25/2019   Hyponatremia 12/24/2018   SIADH (syndrome of inappropriate ADH production) ([place]) 12/24/2018   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) 10/27/2018   Generalized weakness 10/14/2018   Hip fracture ([place]) 09/15/2018   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) 04/03/2015   Breathlessness on exertion 11/21/2014   Breath shortness 11/21/2014   Arthropathy 11/07/2014   Atrial flutter, paroxysmal ([place]) 11/07/2014   Type 2 diabetes mellitus ([place]) 11/07/2014   Benign essential tremor 11/07/2014   Benign essential HTN 11/07/2014   Mixed incontinence 11/07/2014   Hypercholesterolemia without hypertriglyceridemia 11/07/2014   Apnea, sleep 11/07/2014   Controlled type 2 diabetes mellitus without complication ([place]) 33/91/7921   Pure hypercholesterolemia 11/07/2014   Other abnormalities of gait and mobility 11/02/2011   Decreased  mobility 11/02/2011   Abnormal gait 06/29/2011   Discoordination 06/29/2011   Pura Spice, PT, DPT # [id number] 06/24/2021, 9:07 AM  [facility] [address] [place], [place], [address] Phone: [phone]   Fax:  [phone]  Name: Javarri Segal MRN: [id number] Date of Birth: [birth year]

## 2021-06-29 ENCOUNTER — Other Ambulatory Visit: Payer: Self-pay

## 2021-06-29 ENCOUNTER — Encounter: Payer: Self-pay | Admitting: Physical Therapy

## 2021-06-29 ENCOUNTER — Ambulatory Visit: Payer: Medicare Other | Attending: Orthopedic Surgery | Admitting: Physical Therapy

## 2021-06-29 DIAGNOSIS — R269 Unspecified abnormalities of gait and mobility: Secondary | ICD-10-CM | POA: Insufficient documentation

## 2021-06-29 DIAGNOSIS — R293 Abnormal posture: Secondary | ICD-10-CM | POA: Insufficient documentation

## 2021-06-29 DIAGNOSIS — R2689 Other abnormalities of gait and mobility: Secondary | ICD-10-CM | POA: Insufficient documentation

## 2021-06-29 DIAGNOSIS — M6281 Muscle weakness (generalized): Secondary | ICD-10-CM | POA: Diagnosis not present

## 2021-06-29 NOTE — Therapy (Signed)
[facility] [address] [place], [place], [address] Phone: [phone]   Fax:  [phone]  Physical Therapy Treatment  Patient Details  Name: Mike Holloway MRN: [id number] Date of Birth: 09/28/1940 Referring Provider (PT): Dr. Rudene Christians   Encounter Date: 06/29/2021   PT End of Session - 06/29/21 1400     Visit Number 125    Number of Visits 125    Date for PT Re-Evaluation 06/30/21    Authorization - Visit Number 7    Authorization - Number of Visits 10    PT Start Time 2229    PT Stop Time 1403    PT Time Calculation (min) 51 min    Equipment Utilized During Treatment Gait belt    Activity Tolerance Patient tolerated treatment well;Patient limited by pain    Behavior During Therapy WFL for tasks assessed/performed             Past Medical History:  Diagnosis Date   Arthritis    Atrial flutter ([place])    Diabetes mellitus ([place])    Essential tremor    Essential tremor    deep brain stimulator    Hypercholesteremia    Hypertension    Incontinence    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   SIADH (syndrome of inappropriate ADH production) ([place])    Sleep apnea    Stroke [facility])     Past Surgical History:  Procedure Laterality Date   ABLATION     APPENDECTOMY     CATARACT EXTRACTION, BILATERAL     COLONOSCOPY WITH PROPOFOL N/A 03/17/2018   Procedure: COLONOSCOPY WITH PROPOFOL;  Surgeon: Jonathon Bellows, MD;  Location: [facility] 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   Subjective Assessment - 06/29/21 1358     Subjective Pt present to treatment with sx ISQ. Pt states that he visited with grandson yesterday for his birthday.    Patient is accompained by: Family member    Pertinent History Pt. states R knee is bothering him but it is the typical discomfort he experiences    Limitations House hold activities;Walking;Standing;Lifting    How long can you sit comfortably? 2 hours    How long can you stand comfortably? <10 min    How long can you walk comfortably? 10 min    Patient Stated Goals Improve LE strength/ gait and balance with daily tasks.  Improve gait/ safety/ progression to least assistive device.    Currently in Pain? Yes    Pain Score 3     Pain Location Knee    Pain Onset More than a month ago                Treatment:  Neuromuscular Re-Education:  Standing tolerance 2-3 min with duel  tasking facilitated with nut/bolt deconstruction x5 and cone from upper shelve to lower shelve and back x4. Verbal and contact cueing to facilitate optimal tissue loading and correct biomechanical alignment to ensure efficient and safe movement.     Therapeutic Exercise:  Outdoor amb over sidewalk with off camber surfaces. CGA with gait belt donned and verbal/contact cueing to ensure correct use of 4WW and step sequencing. 300 ft without seated break needed.   Nustep L4 10 min. B LE (consistent cadence)- no rest breaks.  MH to low back         PT Long Term Goals - 05/06/21 0853       PT LONG TERM GOAL #1   Title Pt. independent with HEP to increase B hip flexion/ R quad muscle strength 1/2 muscle grade to improve pain-free mobility.    Baseline R knee extension limited to 4/5 MMT secondary to pain/ fear of pain. 2/9: Deferred.  4/13: 4+/5 MMT (max  potential).    Time 4    Period Weeks    Status Achieved    Target Date 01/07/21      PT LONG TERM GOAL #2   Title Pt. will increase Berg balance test to >40 out of 56 to improve independence with gait/ decrease fall risk.    Baseline Berg: 22/56 (significant fall risk); 9/30 32/56.  11/10: 32 (limited by R knee pain)  2/9: 33/56 limited due to R knee and Low Back pain; 8/10: 29/56; 10/5: 38/56, 11/18/20: 28. 04/28/2021: 28/56    Time 8    Period Weeks    Status Partially Met    Target Date 06/30/21      PT LONG TERM GOAL #3   Title Pt. able to ambulate 100 feet with consistent 2 point gait pattern and use of least restrictive assistive devce to improve household mobility.    Baseline Pt. able to ambulate with use of RW/ CGA to min. A for safety.   Heavy use of B UE.; 9/30 pt able to ambulate with rollator with inconsistent 2 point gait pattern, heavy reliance on UE CGA/supervision  2/9: Pt. uses 2 point step pattern with walking with rollader with decreased stride length. Pt. would be limited due to pain in the knee walking for >100 ft.; 8/10: Pt demonstrates ability to ambulate 100' with consistent 2 point gait pattern with rollator. 11/18/20: Pt able to amb 130 ft with Rollator and 2-point gait pattern. 04/28/21: Pt able to amb 148 ft with Rollator and 2-point gait pattern    Time 8    Period Weeks    Status Achieved    Target Date 04/28/21      PT LONG TERM GOAL #4   Title Pt. able to stand from normal chair with no UE assist to improve safety/independence with transfers.     Baseline Unable to stand from standard chair without heavy UE assist. 10/6, pt unable to stand from standard chair without UE support.  11/10: benefits from 1 UE assist    2/9: Pt. can rise from normal chair with 1 UE assist and must control descent with UE assist.; 8/10: Pt requires 2 UE for standing from standard chair height.; 10/5: Requires single UE to stand. 11/18/20: Pt requires B UE support.  04/28/21: Pt requires  single UE support    Time 8    Period Weeks    Status Partially Met    Target Date 06/30/21      PT LONG TERM GOAL #5  Title Pt. will perform a sit to stand with 1 UE support to be able to perform toileting activities with more independance.    Baseline Pt. requires 2 UE to push from to sit to stand from standard chair height. Pt. has grab rails available but can not get up by pulling.; 8/10: pt requires 2 UE's to STS from standard chair height.; 10/5: Only required single UE support to stand. Use of momentum but able to perform independently.    Time 8    Period Weeks    Status Achieved      PT LONG TERM GOAL #6   Title Pt. will ascend/ descend 4 steps with use of B handrails and step pattern with mod. independence safely to be able to enter son's house.    Baseline Step pattern with heavy UE assist and CGA/min. A for safety.; 8/10: Pt safely asc/desc 4 steps with B hand rails and step to pattern with mod indep to enter son's house safely.    Time 0    Period Weeks    Status Achieved      PT LONG TERM GOAL #7   Title Pt will be able to stand at bathroom sink for 5 min with no UE support to complete ADL's like brushing his teeth, washing his hands, and combing his hair.    Baseline 8/10: Ability to stand with no UE support for 2 min.; 10/5: 3.5 min due to LBP. Has demonstrated in previous PT sessions he can stand for 5 min. 04/28/2021: 5 min.    Time 8    Period Weeks    Status Achieved      PT LONG TERM GOAL #8   Title Pt will demonstrate ability to stand from a lowered surface below 18" with single Ue support to demonstrate improved BLE strength to improve transfers from toilet.    Baseline 10/5: Able to stand from 18" chair with single UE support with use of momentum. 11/18/20: able to after 3 attempts.    Time 8    Period Weeks    Status Partially Met    Target Date 06/30/21                   Plan - 06/29/21 1400     Clinical Impression Statement Pt tolerated tx well  with prolonged standing and walking focused tx. Pt displays increased UE tremors, respiratory rate, and anxiety of movement when standing prolonged beyond 3 mins. Pt continues to display mark LE endurance, strength, and ROM deficits resulting in decreased safe home/community mobiity, increased pain, and limited access to QOL. Pt. will continue to benefit from skilled physical therapy to progress POC to address remaining deficits to facilitate maximum functional capacity for optimal personal health and wellness for ADLs.  Pt required extended rest breaks between bouts of activity 2/2 fatigue in standing posture.    Personal Factors and Comorbidities Age    Examination-Activity Limitations Toileting;Stand;Squat;Lift;Stairs    Stability/Clinical Decision Making Evolving/Moderate complexity    Clinical Decision Making Moderate    Rehab Potential Fair    PT Frequency 1x / week    PT Duration 8 weeks    PT Treatment/Interventions Moist Heat;Functional mobility training;Therapeutic activities;Therapeutic exercise;Balance training;Patient/family education;Manual techniques;Passive range of motion;Neuromuscular re-education;Stair training;Gait training;ADLs/Self Care Home Management    PT Next Visit Plan Complete increase stair training/ repetitions and standing endurance.  RECERT next tx.    PT Home Exercise Plan [email or web address]    Consulted and Agree with Plan of  Care Patient;Family member/caregiver    Family Member Consulted spouse: [name]             Patient will benefit from skilled therapeutic intervention in order to improve the following deficits and impairments:  Abnormal gait, Decreased endurance, Decreased activity tolerance, Pain, Decreased balance, Impaired flexibility, Decreased strength, Postural dysfunction  Visit Diagnosis: Muscle weakness (generalized)  Abnormal posture  Gait difficulty  Balance problem     Problem List Patient Active Problem List   Diagnosis Date Noted   DVT  femoral (deep venous thrombosis) with thrombophlebitis, left (HCC) 02/06/2020   Lymphedema 04/15/2019   Bilateral leg edema 01/25/2019   Hyponatremia 12/24/2018   SIADH (syndrome of inappropriate ADH production) ([place]) 12/24/2018   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) 10/27/2018   Generalized weakness 10/14/2018   Hip fracture ([place]) 09/15/2018   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   HLD (hyperlipidemia) 03/24/2018   Contusion of right knee 02/14/2018   Bradycardia 12/28/2017   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   Tremor 09/04/2016   Chronic pain of right knee 08/10/2016   Right ankle pain 08/10/2016   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[place]) 12/11/2015   Urinary retention 09/08/2015   Lymphoma, 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[place]) 04/03/2015   Breathlessness on exertion 11/21/2014   Breath shortness 11/21/2014   Arthropathy 11/07/2014   Atrial flutter, paroxysmal ([place]) 11/07/2014   Type 2 diabetes mellitus ([place]) 11/07/2014   Benign essential tremor 11/07/2014   Benign essential HTN 11/07/2014   Mixed incontinence 11/07/2014   Hypercholesterolemia without hypertriglyceridemia 11/07/2014   Apnea, sleep 11/07/2014   Controlled type 2 diabetes mellitus without complication ([place]) 27/61/8485   Pure hypercholesterolemia 11/07/2014   Other abnormalities of gait and mobility 11/02/2011   Decreased mobility 11/02/2011   Abnormal gait 06/29/2011   Discoordination 06/29/2011   Pura Spice, PT, DPT # [id number] Fara Olden, SPT 06/29/2021, 3:09 PM  [facility] [address] [place], [place], [address] Phone: [phone]   Fax:  [phone]  Name: Bartlett Enke MRN: [id number] Date of Birth: 04/18/1941

## 2021-07-02 ENCOUNTER — Ambulatory Visit (INDEPENDENT_AMBULATORY_CARE_PROVIDER_SITE_OTHER): Payer: Medicare Other | Admitting: Physician Assistant

## 2021-07-02 ENCOUNTER — Other Ambulatory Visit: Payer: Self-pay

## 2021-07-02 DIAGNOSIS — R339 Retention of urine, unspecified: Secondary | ICD-10-CM

## 2021-07-02 NOTE — Progress Notes (Signed)
Cath Change/ Replacement  Patient is present today for a catheter change due to urinary retention.  104ml of water was removed from the balloon, a 16FR coude foley cath was removed without difficulty.  Patient was cleaned and prepped in a sterile fashion with betadine and 2% lidocaine jelly was instilled into the urethra. A 16 FR coude foley cath was replaced into the bladder no complications were noted Urine return was noted 4ml and urine was yellow in color. The balloon was filled with 68ml of sterile water. A leg bag was attached for drainage.  Patient tolerated well.    Performed by: Debroah Loop, PA-C   Follow up: Return in about 4 weeks (around 07/30/2021) for Catheter exchange.

## 2021-07-07 ENCOUNTER — Other Ambulatory Visit: Payer: Self-pay

## 2021-07-07 ENCOUNTER — Ambulatory Visit: Payer: Medicare Other

## 2021-07-07 DIAGNOSIS — R269 Unspecified abnormalities of gait and mobility: Secondary | ICD-10-CM

## 2021-07-07 DIAGNOSIS — M6281 Muscle weakness (generalized): Secondary | ICD-10-CM

## 2021-07-07 DIAGNOSIS — R293 Abnormal posture: Secondary | ICD-10-CM

## 2021-07-07 NOTE — Therapy (Signed)
[facility] [address]. [place], [place], [address] Phone: [phone]   Fax:  [phone]  Physical Therapy Treatment and Recert. 01/20/2021-07/07/2021  Patient Details  Name: Mike Holloway MRN: [id number] Date of Birth: 1941/09/14 Referring Provider (PT): Dr. Rudene Christians   Encounter Date: 07/07/2021   PT End of Session - 07/07/21 1338     Visit Number 126    Number of Visits 133    Date for PT Re-Evaluation 09/01/21    Authorization - Visit Number 8    Authorization - Number of Visits 10    PT Start Time 0347    PT Stop Time 1346    PT Time Calculation (min) 48 min    Equipment Utilized During Treatment Gait belt    Activity Tolerance Patient tolerated treatment well;Patient limited by pain;Patient limited by fatigue    Behavior During Therapy [facility] for tasks assessed/performed             Past Medical History:  Diagnosis Date   Arthritis    Atrial flutter ([place])    Diabetes mellitus ([place])    Essential tremor    Essential tremor    deep brain stimulator    Hypercholesteremia    Hypertension    Incontinence    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   SIADH (syndrome of inappropriate ADH production) ([place])    Sleep apnea    Stroke [facility])     Past Surgical History:  Procedure Laterality Date   ABLATION     APPENDECTOMY     CATARACT EXTRACTION, BILATERAL     COLONOSCOPY WITH PROPOFOL N/A 03/17/2018   Procedure: COLONOSCOPY WITH PROPOFOL;  Surgeon: Jonathon Bellows, MD;  Location: [facility] 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   Subjective Assessment - 07/07/21 1258     Subjective Pt present to treatment with reports of R hip and R knee pain. Pt states that he his considering a f/u appt with Dr. Rudene Christians for futher assessment. Pt continues to report c/c of poor balance, increased fall risk, and poor standing tolerance.    Patient is accompained by: Family member    Pertinent History Pt. states R knee is bothering him but it is the typical discomfort he experiences    Limitations House hold activities;Walking;Standing;Lifting    How long can you sit comfortably? 2 hours    How long can you stand comfortably? <10 min    How long can you walk comfortably? 10 min    Patient Stated Goals Improve LE strength/ gait and balance with daily tasks.  Improve gait/ safety/ progression to least assistive device.    Currently in Pain? Yes    Pain Score 2     Pain Location Knee    Pain Orientation  Right    Pain Descriptors / Indicators Aching    Pain Onset More than a month ago    Pain Score 2    Pain Location Hip    Pain Orientation Right    Pain Descriptors / Indicators Aching    Pain Type Chronic pain    Pain Frequency Occasional            Reassessment and treatment.    BERG: 21/56 regression from previous assessment of 28/56. *pt required several seated rest breaks 2/2 to poor standing tolerance and L hip/knee/ankle pain. *   Amb: Pt was able to amb 300 ft with rollator and 2 point gait pattern. Walking rollator is the least restricitive AD as this time. PT required several standing rest breaks.   STS transfer:  Pt requires single UE support 07/07/2021: Pt continues to require R UE, unable to complete without UE when attempted.   NuStep L5 with bilat UE  assist to facilitate large amp movement to address shoulder pain and parkinsonism related posture. Verbal and contact cueing to facilitate optimal tissue loading and correct biomechanical alignment to ensure efficient and safe movement.  10 minutes with MHP place at the lumbar and lower thoracic spine.     PT Education - 07/07/21 1338     Education Details PT was educated on progression of deficits from onset of POC.    Person(s) Educated Patient    Methods Explanation    Comprehension Verbalized understanding                 PT Long Term Goals - 07/07/21 1301       PT LONG TERM GOAL #1   Title --    Baseline --    Time --    Period --    Status --    Target Date --      PT LONG TERM GOAL #2   Title Pt. will increase Berg balance test to >40 out of 56 to improve independence with gait/ decrease fall risk.    Baseline Berg: 22/56 (significant fall risk); 9/30 32/56.  11/10: 32 (limited by R knee pain)  2/9: 33/56 limited due to R knee and Low Back pain; 8/10: 29/56; 10/5: 38/56, 11/18/20: 28. 04/28/2021: 28/56 07/07/2021: 21/56    Time 8    Period Weeks    Status Partially Met    Target Date 09/01/21      PT LONG TERM GOAL #3   Title Pt. able to ambulate 100 feet with consistent 2 point gait pattern and use of least restrictive assistive devce to improve household mobility.    Baseline Pt. able to ambulate with use of RW/ CGA to min. A for safety.   Heavy use of B UE.; 9/30 pt able to ambulate with rollator with inconsistent 2 point gait pattern, heavy reliance on UE CGA/supervision  2/9: Pt. uses 2 point step pattern with walking with rollader with decreased stride length. Pt. would be limited due to pain in the knee walking for >100 ft.; 8/10: Pt demonstrates ability to ambulate 100' with consistent 2 point gait pattern with rollator. 11/18/20: Pt able to amb 130 ft with Rollator and 2-point gait pattern. 04/28/21: Pt able to amb 148 ft with Rollator and 2-point gait pattern  07/07/2021 Pt was able to amb 300 ft with rollator and 2 point gat pattern. Walking rollator is the least restricitive AD as this time. PT required several standing rest breaks.    Time 8  Period Weeks    Status Achieved    Target Date 07/07/21      PT LONG TERM GOAL #4   Title Pt. able to stand from normal chair with no UE assist to improve safety/independence with transfers.     Baseline Unable to stand from standard chair without heavy UE assist. 10/6, pt unable to stand from standard chair without UE support.  11/10: benefits from 1 UE assist    2/9: Pt. can rise from normal chair with 1 UE assist and must control descent with UE assist.; 8/10: Pt requires 2 UE for standing from standard chair height.; 10/5: Requires single UE to stand. 11/18/20: Pt requires B UE support.  04/28/21: Pt requires single UE support 07/07/2021: Pt continues to require R UE, unable to complete without UE when attempted.    Time 8    Period Weeks    Status Partially Met    Target Date 09/01/21      PT LONG TERM GOAL #5   Title .      PT LONG TERM GOAL #8   Title Pt will demonstrate ability to stand from a lowered surface below 18" with single Ue support to demonstrate improved BLE strength to improve transfers from toilet.    Baseline 10/5: Able to stand from 18" chair with single UE support with use of momentum. 11/18/20: able to after 3 attempts. 10/11: Able to complete after 2 attempts with single UE assist.    Time 8    Period Weeks    Status Partially Met    Target Date 09/01/21                   Plan - 07/07/21 1339     Clinical Impression Statement Pt was reassessed on this date 07/07/2021. Pt displayed regression in BERG to 21/56, previous assessment of 28/56. All other goal displayed assessment ISQ from previous tx. Sx remaining ISQ could be understood as a positive response to PT tx 2/2 to the progressive nature of parkinsonism. Pt continues to display mark bilat  LE/UE endurance,  strength, and ROM deficits resulting in decreased safe home/community mobiity, increased pain, and limited access to QOL. Pt. will continue to benefit from skilled physical therapy 1xweek for 8 weeks to progress POC to address remaining deficits to facilitate maximum functional capacity for optimal personal health and wellness for ADLs.    Personal Factors and Comorbidities Age    Examination-Activity Limitations Toileting;Stand;Squat;Lift;Stairs    Stability/Clinical Decision Making Evolving/Moderate complexity    Clinical Decision Making Moderate    Rehab Potential Fair    PT Frequency 1x / week    PT Duration 8 weeks    PT Treatment/Interventions Moist Heat;Functional mobility training;Therapeutic activities;Therapeutic exercise;Balance training;Patient/family education;Manual techniques;Passive range of motion;Neuromuscular re-education;Stair training;Gait training;ADLs/Self Care Home Management    PT Next Visit Plan Progress LE strengthening and balance to reduce fall risk.    PT Home Exercise Plan [email or web address]    Consulted and Agree with Plan of Care Patient;Family member/caregiver    Family Member Consulted spouse: [name]             Patient will benefit from skilled therapeutic intervention in order to improve the following deficits and impairments:  Abnormal gait, Decreased endurance, Decreased activity tolerance, Pain, Decreased balance, Impaired flexibility, Decreased strength, Postural dysfunction  Visit Diagnosis: Muscle weakness (generalized)  Abnormal posture  Gait difficulty     Problem List Patient Active Problem List   Diagnosis Date Noted  DVT femoral (deep venous thrombosis) with thrombophlebitis, left (HCC) 02/06/2020   Lymphedema 04/15/2019   Bilateral leg edema 01/25/2019   Hyponatremia 12/24/2018   SIADH (syndrome of inappropriate ADH production) ([place]) 12/24/2018   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) 10/27/2018   Generalized  weakness 10/14/2018   Hip fracture ([place]) 09/15/2018   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   HLD (hyperlipidemia) 03/24/2018   Contusion of right knee 02/14/2018   Bradycardia 12/28/2017   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   Tremor 09/04/2016   Chronic pain of right knee 08/10/2016   Right ankle pain 08/10/2016   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[place]) 12/11/2015   Urinary retention 09/08/2015   Lymphoma, 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[place]) 04/03/2015   Breathlessness on exertion 11/21/2014   Breath shortness 11/21/2014   Arthropathy 11/07/2014   Atrial flutter, paroxysmal ([place]) 11/07/2014   Type 2 diabetes mellitus ([place]) 11/07/2014   Benign essential tremor 11/07/2014   Benign essential HTN 11/07/2014   Mixed incontinence 11/07/2014   Hypercholesterolemia without hypertriglyceridemia 11/07/2014   Apnea, sleep 11/07/2014   Controlled type 2 diabetes mellitus without complication ([place]) 00/17/4944   Pure hypercholesterolemia 11/07/2014   Other abnormalities of gait and mobility 11/02/2011   Decreased mobility 11/02/2011   Abnormal gait 06/29/2011   Discoordination 06/29/2011   Fara Olden, SPT   [place]. Fairly IV, PT, DPT Physical Therapist- [facility]  07/07/2021, 3:43 PM  [facility] [address] [place], [place], [address] Phone: [phone]   Fax:  [phone]  Name: Mike Holloway MRN: [id number] Date of Birth: Jul 22, 1941

## 2021-07-14 ENCOUNTER — Other Ambulatory Visit: Payer: Self-pay

## 2021-07-14 ENCOUNTER — Encounter: Payer: Self-pay | Admitting: Physical Therapy

## 2021-07-14 ENCOUNTER — Ambulatory Visit: Payer: Medicare Other

## 2021-07-14 DIAGNOSIS — M6281 Muscle weakness (generalized): Secondary | ICD-10-CM | POA: Diagnosis not present

## 2021-07-14 DIAGNOSIS — R293 Abnormal posture: Secondary | ICD-10-CM

## 2021-07-14 DIAGNOSIS — R2689 Other abnormalities of gait and mobility: Secondary | ICD-10-CM

## 2021-07-14 DIAGNOSIS — R269 Unspecified abnormalities of gait and mobility: Secondary | ICD-10-CM

## 2021-07-14 NOTE — Therapy (Signed)
[facility] [address]. [place], [place], [address] Phone: [phone]   Fax:  [phone]  Physical Therapy Treatment  Patient Details  Name: Mike Holloway MRN: [id number] Date of Birth: 1940-11-01 Referring Provider (PT): Dr. Rudene Christians   Encounter Date: 07/14/2021   PT End of Session - 07/14/21 1410     Visit Number 127    Number of Visits 133    Date for PT Re-Evaluation 09/01/21    Authorization - Visit Number 9    Authorization - Number of Visits 10    PT Start Time 1301    PT Stop Time 2500    PT Time Calculation (min) 51 min    Equipment Utilized During Treatment Gait belt    Activity Tolerance Patient tolerated treatment well;Patient limited by pain;Patient limited by fatigue    Behavior During Therapy [facility] for tasks assessed/performed             Past Medical History:  Diagnosis Date   Arthritis    Atrial flutter ([place])    Diabetes mellitus ([place])    Essential tremor    Essential tremor    deep brain stimulator    Hypercholesteremia    Hypertension    Incontinence    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   SIADH (syndrome of inappropriate ADH production) ([place])    Sleep apnea    Stroke [facility])     Past Surgical History:  Procedure Laterality Date   ABLATION     APPENDECTOMY     CATARACT EXTRACTION, BILATERAL     COLONOSCOPY WITH PROPOFOL N/A 03/17/2018   Procedure: COLONOSCOPY WITH PROPOFOL;  Surgeon: Jonathon Bellows, MD;  Location: [facility] 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   Subjective Assessment - 07/14/21 1404     Subjective Pt presents to tx with sx ISQ since last tx with no reports of falls. Pt's spouse states that she would like to travel with her husband to her sisters house in 2 weeks. Pt currently gets in and out of bed with bed rail, however, the travel destination does not have a bed rail. Pt and spouse would like to be able to perform supine to EOB transfers without bedrailling to allow travel.    Patient is accompained by: Family member    Pertinent History Pt. states R knee is bothering him but it is the typical discomfort he experiences    Limitations House hold activities;Walking;Standing;Lifting    How long can you sit comfortably? 2 hours    How long can you stand comfortably? <10 min    How long can you walk comfortably? 10 min    Patient Stated Goals Improve LE strength/ gait and balance with daily tasks.  Improve gait/ safety/ progression to  least assistive device.    Currently in Pain? Yes    Pain Score 2     Pain Location Knee    Pain Orientation Right    Pain Descriptors / Indicators Aching    Pain Type Chronic pain    Pain Onset More than a month ago            Treatment   Therapeutic Exercise:  // bars with CGA-SBA and gait belt donned. Verbal and contact cueing to facilitate optimal tissue loading and correct biomechanical alignment to ensure efficient and safe movement.  3 laps each movement. Pt displayed marked reliance on // Bars for UE assist.  1) Standard gait  2) Lateral Walking  3) Backwards Walking  4) lateral toe taps.   NuStep L5 without UE assist and MPH placed at the low back. Bed transfers were discussed with pt and pt's spouse in order to practice transfers at home without bed rail if  possible. Pt spouse was told not the assist with transfer 2/2 fear of injury.     Therapeutic Activity :   EOB-to-Supine x 2 from L+ R and Supine-to-EOB x2 from L + R. Verbal and occ contact cueing was used to ensure proper mobility. Pt displayed greater ease with transferring to the L side of the bed 2/2 R hip pain. Bed transfers were discussed with pt and pt's spouse in order to practice transfers at home without bed rail if possible. Pt spouse was told not the assist with transfer 2/2 fear of injury.     Amb 100 ft with FWW and CGA with gait belt donned to car with MI car transfer.    PT Education - 07/14/21 1408     Education Details Pt was educated on supine to EOB transfers to both L and R side EOB.    Person(s) Educated Patient    Methods Explanation;Tactile cues;Verbal cues    Comprehension Verbalized understanding;Returned demonstration;Verbal cues required                 PT Long Term Goals - 07/07/21 1301       PT LONG TERM GOAL #1   Title --    Baseline --    Time --    Period --    Status --    Target Date --      PT LONG TERM GOAL #2   Title Pt. will increase Berg balance test to >40 out of 56 to improve independence with gait/ decrease fall risk.    Baseline Berg: 22/56 (significant fall risk); 9/30 32/56.  11/10: 32 (limited by R knee pain)  2/9: 33/56 limited due to R knee and Low Back pain; 8/10: 29/56; 10/5: 38/56, 11/18/20: 28. 04/28/2021: 28/56 07/07/2021: 21/56    Time 8    Period Weeks    Status Partially Met    Target Date 09/01/21      PT LONG TERM GOAL #3   Title Pt. able to ambulate 100 feet with consistent 2 point gait pattern and use of least restrictive assistive devce to improve household mobility.    Baseline Pt. able to ambulate with use of RW/ CGA to min. A for safety.   Heavy use of B UE.; 9/30 pt able to ambulate with rollator with inconsistent 2 point gait pattern, heavy reliance on UE CGA/supervision  2/9: Pt. uses 2 point step  pattern with walking with rollader with decreased stride length. Pt. would be limited due to pain in the knee  walking for >100 ft.; 8/10: Pt demonstrates ability to ambulate 100' with consistent 2 point gait pattern with rollator. 11/18/20: Pt able to amb 130 ft with Rollator and 2-point gait pattern. 04/28/21: Pt able to amb 148 ft with Rollator and 2-point gait pattern 07/07/2021 Pt was able to amb 300 ft with rollator and 2 point gat pattern. Walking rollator is the least restricitive AD as this time. PT required several standing rest breaks.    Time 8    Period Weeks    Status Achieved    Target Date 07/07/21      PT LONG TERM GOAL #4   Title Pt. able to stand from normal chair with no UE assist to improve safety/independence with transfers.     Baseline Unable to stand from standard chair without heavy UE assist. 10/6, pt unable to stand from standard chair without UE support.  11/10: benefits from 1 UE assist    2/9: Pt. can rise from normal chair with 1 UE assist and must control descent with UE assist.; 8/10: Pt requires 2 UE for standing from standard chair height.; 10/5: Requires single UE to stand. 11/18/20: Pt requires B UE support.  04/28/21: Pt requires single UE support 07/07/2021: Pt continues to require R UE, unable to complete without UE when attempted.    Time 8    Period Weeks    Status Partially Met    Target Date 09/01/21      PT LONG TERM GOAL #5   Title .      PT LONG TERM GOAL #8   Title Pt will demonstrate ability to stand from a lowered surface below 18" with single Ue support to demonstrate improved BLE strength to improve transfers from toilet.    Baseline 10/5: Able to stand from 18" chair with single UE support with use of momentum. 11/18/20: able to after 3 attempts. 10/11: Able to complete after 2 attempts with single UE assist.    Time 8    Period Weeks    Status Partially Met    Target Date 09/01/21                   Plan - 07/14/21 1410     Clinical  Impression Statement Today's tx was focused on bed mobility to allow the pt and pt's spouse to travel in 2 weeks. Pt displayed greater easy with transferring to the L side of the bed 2/2 R hip pain. Bed transfers were discussed with pt and pt's spouse in order to practice transfers at home without bed rail if possible. Pt spouse was told not the assist with transfer 2/2 fear of injury. Pt continues to display mark LE/UE endurance, strength, and ROM deficits resulting in decreased safe home/community mobiity, increased pain, and limited access to QOL. Pt. will continue to benefit from skilled physical therapy to progress POC to address remaining deficits to facilitate maximum functional capacity for optimal personal health and wellness for ADLs.    Personal Factors and Comorbidities Age    Examination-Activity Limitations Toileting;Stand;Squat;Lift;Stairs    Stability/Clinical Decision Making Evolving/Moderate complexity    Clinical Decision Making Moderate    Rehab Potential Fair    PT Frequency 1x / week    PT Duration 8 weeks    PT Treatment/Interventions Moist Heat;Functional mobility training;Therapeutic activities;Therapeutic exercise;Balance training;Patient/family education;Manual techniques;Passive range of motion;Neuromuscular re-education;Stair training;Gait training;ADLs/Self Care Home Management    PT Next Visit Plan Progress LE strengthening and balance to reduce fall risk.  PT Home Exercise Plan [id number]    Consulted and Agree with Plan of Care Patient;Family member/caregiver    Family Member Consulted spouse: [name]             Patient will benefit from skilled therapeutic intervention in order to improve the following deficits and impairments:  Abnormal gait, Decreased endurance, Decreased activity tolerance, Pain, Decreased balance, Impaired flexibility, Decreased strength, Postural dysfunction  Visit Diagnosis: Muscle weakness (generalized)  Gait difficulty  Abnormal  posture  Balance problem     Problem List Patient Active Problem List   Diagnosis Date Noted   DVT femoral (deep venous thrombosis) with thrombophlebitis, left (HCC) 02/06/2020   Lymphedema 04/15/2019   Bilateral leg edema 01/25/2019   Hyponatremia 12/24/2018   SIADH (syndrome of inappropriate ADH production) ([place]) 12/24/2018   Erosion of urethra due to catheterization of urinary tract ([place]) 10/27/2018   Generalized weakness 10/14/2018   Hip fracture ([place]) 09/15/2018   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   HLD (hyperlipidemia) 03/24/2018   Contusion of right knee 02/14/2018   Bradycardia 12/28/2017   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   Tremor 09/04/2016   Chronic pain of right knee 08/10/2016   Right ankle pain 08/10/2016   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[place]) 12/11/2015   Urinary retention 09/08/2015   Lymphoma, 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[place]) 04/03/2015   Breathlessness on exertion 11/21/2014   Breath shortness 11/21/2014   Arthropathy 11/07/2014   Atrial flutter, paroxysmal ([place]) 11/07/2014   Type 2 diabetes mellitus ([place]) 11/07/2014   Benign essential tremor 11/07/2014   Benign essential HTN 11/07/2014   Mixed incontinence 11/07/2014   Hypercholesterolemia without hypertriglyceridemia 11/07/2014   Apnea, sleep 11/07/2014   Controlled type 2 diabetes mellitus without complication ([place]) 62/56/3893   Pure hypercholesterolemia 11/07/2014   Other abnormalities of gait and mobility 11/02/2011   Decreased mobility 11/02/2011   Abnormal gait 06/29/2011   Discoordination 06/29/2011   Fara Olden, SPT  [place]. Fairly IV, PT, DPT Physical Therapist- [facility]  07/14/2021, 2:49 PM  [facility] [address] [place], [place],  [address] Phone: [phone]   Fax:  [phone]  Name: Melroy Bougher MRN: [id number] Date of Birth: 1941/01/15

## 2021-07-21 ENCOUNTER — Ambulatory Visit: Payer: Medicare Other

## 2021-07-21 ENCOUNTER — Other Ambulatory Visit: Payer: Self-pay

## 2021-07-21 DIAGNOSIS — R293 Abnormal posture: Secondary | ICD-10-CM

## 2021-07-21 DIAGNOSIS — M6281 Muscle weakness (generalized): Secondary | ICD-10-CM | POA: Diagnosis not present

## 2021-07-21 DIAGNOSIS — R2689 Other abnormalities of gait and mobility: Secondary | ICD-10-CM

## 2021-07-21 DIAGNOSIS — R269 Unspecified abnormalities of gait and mobility: Secondary | ICD-10-CM

## 2021-07-21 NOTE — Therapy (Signed)
[facility] [address] [place], [place], [address] Phone: [phone]   Fax:  [phone]  Physical Therapy Treatment  Patient Details  Name: Mike Holloway MRN: [id number] Date of Birth: 11-28-1940 Referring Provider (PT): Dr. Rudene Christians   Encounter Date: 07/21/2021   PT End of Session - 07/21/21 1401     Visit Number 128    Number of Visits 133    Date for PT Re-Evaluation 09/01/21    Authorization - Visit Number 10    Authorization - Number of Visits 10    PT Start Time 9480    PT Stop Time 1401    PT Time Calculation (min) 59 min    Equipment Utilized During Treatment Gait belt    Activity Tolerance Patient tolerated treatment well;Patient limited by pain;Patient limited by fatigue    Behavior During Therapy [facility] for tasks assessed/performed             Past Medical History:  Diagnosis Date   Arthritis    Atrial flutter ([place])    Diabetes mellitus ([place])    Essential tremor    Essential tremor    deep brain stimulator    Hypercholesteremia    Hypertension    Incontinence    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   SIADH (syndrome of inappropriate ADH production) ([place])    Sleep apnea    Stroke [facility])     Past Surgical History:  Procedure Laterality Date   ABLATION     APPENDECTOMY     CATARACT EXTRACTION, BILATERAL     COLONOSCOPY WITH PROPOFOL N/A 03/17/2018   Procedure: COLONOSCOPY WITH PROPOFOL;  Surgeon: Jonathon Bellows, MD;  Location: [facility] 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   Subjective Assessment - 07/21/21 1308     Subjective Pt presents to tx with slight increase in R hip and knee pain. Pt is unaware of cause for the increase in the pain. Pt states that he no longer going on the trip 2/2 to onset of family drama over the weekend. Pt did not attempt bed transfer in the home, and no longer interested in the obtainment of that skill.    Patient is accompained by: Family member    Pertinent History Pt. states R knee is bothering him but it is the typical discomfort he experiences    Limitations House hold activities;Walking;Standing;Lifting    How long can you sit comfortably? 2 hours    How long can you stand comfortably? <10 min    How long can you walk comfortably? 10 min    Patient Stated Goals Improve LE strength/ gait and balance with daily tasks.  Improve gait/ safety/ progression to least assistive device.    Currently in Pain? Yes  Pain Score 4     Pain Location Knee    Pain Orientation Right    Pain Descriptors / Indicators Aching    Pain Onset More than a month ago    Pain Score 4    Pain Location Hip    Pain Orientation Right    Pain Descriptors / Indicators Aching               Treatment     Therapeutic Exercise:  // bars with CGA-SBA and gait belt donned. Verbal and contact cueing to facilitate optimal tissue loading and correct biomechanical alignment to ensure efficient and safe movement.  3 laps each movement unless other wise stated. Pt displayed marked reliance on // Bars for UE assist.  1) Standard gait  2) high knee marching  3) lateral toe taps.    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 Verbal and contact cueing to facilitate optimal tissue loading and correct biomechanical alignment to ensure efficient and safe movement.  1) standard height target  2) R target high, L target low  3) L target high, R target low      NuStep L5 without UE assist and MPH placed at the low back. Proper knee and hip exercises and mobility were dicussed with the patient to promote greater functional mobility and decreased pain. 10 min, not billed.   Frequent seated rest breaks provided throughout session.        PT Education - 07/21/21 1614     Education Details form/technique with exercise    Person(s) Educated Patient    Methods Explanation;Demonstration;Tactile cues;Verbal cues    Comprehension Verbalized understanding;Returned demonstration                 PT Long Term Goals - 07/07/21 1301       PT LONG TERM GOAL #1   Title --    Baseline --    Time --    Period --    Status --    Target Date --      PT LONG TERM GOAL #2   Title Pt. will increase Berg balance test to >40 out of 56 to improve independence with gait/ decrease fall risk.    Baseline Berg: 22/56 (significant fall risk); 9/30 32/56.  11/10: 32 (limited by R knee pain)  2/9: 33/56 limited due to R knee and Low Back pain; 8/10: 29/56; 10/5: 38/56, 11/18/20: 28. 04/28/2021: 28/56 07/07/2021: 21/56    Time 8    Period Weeks    Status Partially Met    Target Date 09/01/21      PT LONG TERM GOAL #3   Title Pt. able to ambulate 100 feet with consistent 2 point gait pattern and use of least restrictive assistive devce to improve household mobility.    Baseline Pt. able to ambulate with use of RW/ CGA to min. A for safety.   Heavy use of B UE.; 9/30 pt able to ambulate with rollator with inconsistent 2 point gait pattern, heavy reliance on UE CGA/supervision  2/9: Pt. uses 2 point step pattern with walking with rollader with decreased stride length. Pt.  would be limited due to pain in the knee walking for >100 ft.; 8/10: Pt demonstrates ability to ambulate 100' with consistent 2 point gait pattern with rollator. 11/18/20: Pt able to amb 130 ft  with Rollator and 2-point gait pattern. 04/28/21: Pt able to amb 148 ft with Rollator and 2-point gait pattern 07/07/2021 Pt was able to amb 300 ft with rollator and 2 point gat pattern. Walking rollator is the least restricitive AD as this time. PT required several standing rest breaks.    Time 8    Period Weeks    Status Achieved    Target Date 07/07/21      PT LONG TERM GOAL #4   Title Pt. able to stand from normal chair with no UE assist to improve safety/independence with transfers.     Baseline Unable to stand from standard chair without heavy UE assist. 10/6, pt unable to stand from standard chair without UE support.  11/10: benefits from 1 UE assist    2/9: Pt. can rise from normal chair with 1 UE assist and must control descent with UE assist.; 8/10: Pt requires 2 UE for standing from standard chair height.; 10/5: Requires single UE to stand. 11/18/20: Pt requires B UE support.  04/28/21: Pt requires single UE support 07/07/2021: Pt continues to require R UE, unable to complete without UE when attempted.    Time 8    Period Weeks    Status Partially Met    Target Date 09/01/21      PT LONG TERM GOAL #5   Title .      PT LONG TERM GOAL #8   Title Pt will demonstrate ability to stand from a lowered surface below 18" with single Ue support to demonstrate improved BLE strength to improve transfers from toilet.    Baseline 10/5: Able to stand from 18" chair with single UE support with use of momentum. 11/18/20: able to after 3 attempts. 10/11: Able to complete after 2 attempts with single UE assist.    Time 8    Period Weeks    Status Partially Met    Target Date 09/01/21                   Plan - 07/21/21 1402     Clinical Impression Statement Today's tx was focused on introduction of  standing rotational movements targeted at parkinsonism related deficits. Pt was able to complete the task without hip or knee pain and only required occ min A via gait belt for fall prevention. Pt continues to display mark bilat LE endurance, strength, and ROM deficits resulting in decreased safe home/community mobiity, increased pain, and limited access to QOL. Pt. will continue to benefit from skilled physical therapy to progress POC to address remaining deficits to facilitate maximum functional capacity for optimal personal health and wellness for ADLs.    Personal Factors and Comorbidities Age    Examination-Activity Limitations Toileting;Stand;Squat;Lift;Stairs    Stability/Clinical Decision Making Evolving/Moderate complexity    Clinical Decision Making Moderate    Rehab Potential Fair    PT Frequency 1x / week    PT Duration 8 weeks    PT Treatment/Interventions Moist Heat;Functional mobility training;Therapeutic activities;Therapeutic exercise;Balance training;Patient/family education;Manual techniques;Passive range of motion;Neuromuscular re-education;Stair training;Gait training;ADLs/Self Care Home Management    PT Next Visit Plan Progress LE strengthening and balance to reduce fall risk.    PT Home Exercise Plan [email or web address]    Consulted and Agree with Plan of Care Patient;Family member/caregiver    Family Member Consulted spouse: [name]             Patient will benefit from skilled therapeutic intervention in order to improve the following deficits and impairments:  Abnormal gait, Decreased endurance, Decreased activity tolerance, Pain, Decreased balance, Impaired flexibility, Decreased strength, Postural dysfunction  Visit Diagnosis: Muscle weakness (generalized)  Gait difficulty  Abnormal posture  Balance problem     Problem List Patient Active Problem List   Diagnosis Date Noted   DVT femoral (deep venous thrombosis) with thrombophlebitis, left (HCC) 02/06/2020    Lymphedema 04/15/2019   Bilateral leg edema 01/25/2019   Hyponatremia 12/24/2018   SIADH (syndrome of inappropriate ADH production) ([place]) 12/24/2018   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) 10/27/2018   Generalized weakness 10/14/2018   Hip fracture ([place]) 09/15/2018   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   HLD (hyperlipidemia) 03/24/2018   Contusion of right knee 02/14/2018   Bradycardia 12/28/2017   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   Tremor 09/04/2016   Chronic pain of right knee 08/10/2016   Right ankle pain 08/10/2016   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[place]) 12/11/2015   Urinary retention 09/08/2015   Lymphoma, 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[place]) 04/03/2015   Breathlessness on exertion 11/21/2014   Breath shortness 11/21/2014   Arthropathy 11/07/2014   Atrial flutter, paroxysmal ([place]) 11/07/2014   Type 2 diabetes mellitus ([place]) 11/07/2014   Benign essential tremor 11/07/2014   Benign essential HTN 11/07/2014   Mixed incontinence 11/07/2014   Hypercholesterolemia without hypertriglyceridemia 11/07/2014   Apnea, sleep 11/07/2014   Controlled type 2 diabetes mellitus without complication ([place]) 16/38/4536   Pure hypercholesterolemia 11/07/2014   Other abnormalities of gait and mobility 11/02/2011   Decreased mobility 11/02/2011   Abnormal gait 06/29/2011   Discoordination 06/29/2011    Fara Olden, Student-PT  Bryan. Fairly IV, PT, DPT Physical Therapist- [facility]  07/21/2021, 4:59 PM  [place] [facility] [address] [place], [place], [address] Phone: [phone]   Fax:  [phone]  Name: Mike Holloway MRN: [id number] Date of Birth: 1941/08/29

## 2021-07-28 ENCOUNTER — Other Ambulatory Visit: Payer: Self-pay

## 2021-07-28 ENCOUNTER — Encounter: Payer: Self-pay | Admitting: Physical Therapy

## 2021-07-28 ENCOUNTER — Ambulatory Visit: Payer: Medicare Other | Attending: Orthopedic Surgery

## 2021-07-28 DIAGNOSIS — M6281 Muscle weakness (generalized): Secondary | ICD-10-CM | POA: Diagnosis present

## 2021-07-28 DIAGNOSIS — R2689 Other abnormalities of gait and mobility: Secondary | ICD-10-CM | POA: Insufficient documentation

## 2021-07-28 DIAGNOSIS — R293 Abnormal posture: Secondary | ICD-10-CM | POA: Insufficient documentation

## 2021-07-28 DIAGNOSIS — R269 Unspecified abnormalities of gait and mobility: Secondary | ICD-10-CM | POA: Diagnosis present

## 2021-07-28 NOTE — Therapy (Signed)
[facility] [address] [place], [place], [address] Phone: [phone]   Fax:  [phone]  Physical Therapy Treatment  Patient Details  Name: Mike Holloway MRN: [id number] Date of Birth: [birth year] Referring Provider (PT): Dr. Rudene Christians   Encounter Date: 07/28/2021   PT End of Session - 07/28/21 1300     Visit Number 129    Number of Visits 133    Date for PT Re-Evaluation 09/01/21    Authorization - Visit Number 10    Authorization - Number of Visits 10    PT Start Time 1301    PT Stop Time 3151    PT Time Calculation (min) 47 min    Equipment Utilized During Treatment Gait belt    Activity Tolerance Patient tolerated treatment well;Patient limited by pain;Patient limited by fatigue    Behavior During Therapy [facility] for tasks assessed/performed             Past Medical History:  Diagnosis Date   Arthritis    Atrial flutter ([place])    Diabetes mellitus ([place])    Essential tremor    Essential tremor    deep brain stimulator    Hypercholesteremia    Hypertension    Incontinence    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   SIADH (syndrome of inappropriate ADH production) ([place])    Sleep apnea    Stroke [facility])     Past Surgical History:  Procedure Laterality Date   ABLATION     APPENDECTOMY     CATARACT EXTRACTION, BILATERAL     COLONOSCOPY WITH PROPOFOL N/A 03/17/2018   Procedure: COLONOSCOPY WITH PROPOFOL;  Surgeon: Jonathon Bellows, MD;  Location: [facility] 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   Subjective Assessment - 07/28/21 1301     Subjective Pt has nothing new to report since previous session. No falls, pt repotrs biofreeze on R knee to assist in pain relief.    Patient is accompained by: Family member    Pertinent History Pt. states R knee is bothering him but it is the typical discomfort he experiences    Limitations House hold activities;Walking;Standing;Lifting    How long can you sit comfortably? 2 hours    How long can you stand comfortably? <10 min    How long can you walk comfortably? 10 min    Patient Stated Goals Improve LE strength/ gait and balance with daily tasks.  Improve gait/ safety/ progression to least assistive device.    Currently in Pain? Yes    Pain Score 1     Pain Location Knee    Pain Orientation Right    Pain Descriptors / Indicators Aching    Pain Onset More than a month ago  There.ex:   Nu Step L4 for 12 min after session. MH applied to low back for full duration. Not billed.   Amb in // bars with LUE support only x4 laps/direction. SBA provided.    Forwards    Side marches   Backwards walking (requires BUE support)   Alternating lunges with forward foot on airex pad for functional strengthening and ankle/hip strategies: x8/LE,CGA provided. 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 2x10, CGA R  target high, L target low    L target high, R target low    STS: 2x5 with R/L trunk rotations with basketball for postural instability. Requires BUE support to stand pushing onto knees with  airex pad in chair. CGA.  STS: x1 with standing overhead raises with basketball for postural instability. CGA.   X2 post LOB with trunk rotations requiring min-modA form PT to correct LOB.     Frequent seated rest breaks provided due to L hip pain  PT Education - 07/28/21 1300     Education Details form/technique with exercise    Person(s) Educated Patient    Methods Explanation;Demonstration;Tactile cues;Verbal cues    Comprehension Verbalized understanding;Returned demonstration;Verbal cues required;Tactile cues required                 PT Long Term Goals - 07/07/21 1301       PT LONG TERM GOAL #1   Title --    Baseline --    Time --    Period --    Status --    Target Date --      PT LONG TERM GOAL #2   Title Pt. will increase Berg balance test to >40 out of 56 to improve independence with gait/ decrease fall risk.    Baseline Berg: 22/56 (significant fall risk); 9/30 32/56.  11/10: 32 (limited by R knee pain)  2/9: 33/56 limited due to R knee and Low Back pain; 8/10: 29/56; 10/5: 38/56, 11/18/20: 28. 04/28/2021: 28/56 07/07/2021: 21/56    Time 8    Period Weeks    Status Partially Met    Target Date 09/01/21      PT LONG TERM GOAL #3   Title Pt. able to ambulate 100 feet with consistent 2 point gait pattern and use of least restrictive assistive devce to improve household mobility.    Baseline Pt. able to ambulate with use of RW/ CGA to min. A for safety.   Heavy use of B UE.; 9/30 pt able to ambulate with rollator with inconsistent 2 point gait pattern, heavy reliance on UE CGA/supervision  2/9: Pt. uses 2 point step pattern with walking with rollader with decreased stride length. Pt. would be limited due to pain in the knee walking for >100 ft.; 8/10: Pt demonstrates ability to ambulate 100' with consistent 2 point gait pattern with rollator. 11/18/20: Pt able to amb 130 ft with Rollator and 2-point gait pattern. 04/28/21: Pt able to amb 148 ft with Rollator and 2-point  gait pattern 07/07/2021 Pt was able to amb 300 ft with rollator and 2 point gat pattern. Walking rollator is the least restricitive AD as this time. PT required several standing rest breaks.    Time 8    Period Weeks    Status Achieved    Target  Date 07/07/21      PT LONG TERM GOAL #4   Title Pt. able to stand from normal chair with no UE assist to improve safety/independence with transfers.     Baseline Unable to stand from standard chair without heavy UE assist. 10/6, pt unable to stand from standard chair without UE support.  11/10: benefits from 1 UE assist    2/9: Pt. can rise from normal chair with 1 UE assist and must control descent with UE assist.; 8/10: Pt requires 2 UE for standing from standard chair height.; 10/5: Requires single UE to stand. 11/18/20: Pt requires B UE support.  04/28/21: Pt requires single UE support 07/07/2021: Pt continues to require R UE, unable to complete without UE when attempted.    Time 8    Period Weeks    Status Partially Met    Target Date 09/01/21      PT LONG TERM GOAL #5   Title .      PT LONG TERM GOAL #8   Title Pt will demonstrate ability to stand from a lowered surface below 18" with single Ue support to demonstrate improved BLE strength to improve transfers from toilet.    Baseline 10/5: Able to stand from 18" chair with single UE support with use of momentum. 11/18/20: able to after 3 attempts. 10/11: Able to complete after 2 attempts with single UE assist.    Time 8    Period Weeks    Status Partially Met    Target Date 09/01/21                   Plan - 07/28/21 1428     Clinical Impression Statement Pt's session with focus on functional strength/balance activities to improve postural instability and improved tolerance in standing with reduced hand support to assist in standing ADL's. Pt still requires at least SUE support on // bars for all activities due to balance deficits. Able to tolerate progression of core rotational  activities with improved stability with wider BOS. Pt still has intermittent x2 post LOB with end range rotations requiring minA for PT to correct. Pt did require intermittent need for seated rest due to L hip pain today but this is pt's normal. Pt will continue to benefit from skilled PT services to improve dynamic balance and tolerance for standing/walking activities to optimize independence with ADL's and reduce risk of falls.    Personal Factors and Comorbidities Age    Examination-Activity Limitations Toileting;Stand;Squat;Lift;Stairs    Stability/Clinical Decision Making Evolving/Moderate complexity    Rehab Potential Fair    PT Frequency 1x / week    PT Duration 8 weeks    PT Treatment/Interventions Moist Heat;Functional mobility training;Therapeutic activities;Therapeutic exercise;Balance training;Patient/family education;Manual techniques;Passive range of motion;Neuromuscular re-education;Stair training;Gait training;ADLs/Self Care Home Management    PT Next Visit Plan Progress LE strengthening and balance to reduce fall risk.    PT Home Exercise Plan [email or web address]    Consulted and Agree with Plan of Care Patient;Family member/caregiver    Family Member Consulted spouse: [name]             Patient will benefit from skilled therapeutic intervention in order to improve the following deficits and impairments:  Abnormal gait, Decreased endurance, Decreased activity tolerance, Pain, Decreased balance, Impaired flexibility, Decreased strength, Postural dysfunction  Visit Diagnosis: Muscle weakness (generalized)  Gait difficulty  Abnormal posture  Balance problem     Problem List Patient Active Problem List   Diagnosis Date Noted  DVT femoral (deep venous thrombosis) with thrombophlebitis, left (HCC) 02/06/2020   Lymphedema 04/15/2019   Bilateral leg edema 01/25/2019   Hyponatremia 12/24/2018   SIADH (syndrome of inappropriate ADH production) ([place]) 12/24/2018   Erosion of urethra  due to catheterization of urinary tract (HCC) 10/27/2018   Generalized weakness 10/14/2018   Hip fracture ([place]) 09/15/2018   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   HLD (hyperlipidemia) 03/24/2018   Contusion of right knee 02/14/2018   Bradycardia 12/28/2017   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   Tremor 09/04/2016   Chronic pain of right knee 08/10/2016   Right ankle pain 08/10/2016   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[place]) 12/11/2015   Urinary retention 09/08/2015   Lymphoma, 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[place]) 04/03/2015   Breathlessness on exertion 11/21/2014   Breath shortness 11/21/2014   Arthropathy 11/07/2014   Atrial flutter, paroxysmal ([place]) 11/07/2014   Type 2 diabetes mellitus ([place]) 11/07/2014   Benign essential tremor 11/07/2014   Benign essential HTN 11/07/2014   Mixed incontinence 11/07/2014   Hypercholesterolemia without hypertriglyceridemia 11/07/2014   Apnea, sleep 11/07/2014   Controlled type 2 diabetes mellitus without complication ([place]) 76/15/1834   Pure hypercholesterolemia 11/07/2014   Other abnormalities of gait and mobility 11/02/2011   Decreased mobility 11/02/2011   Abnormal gait 06/29/2011   Discoordination 06/29/2011    [place] Caster. Fairly IV, PT, DPT Physical Therapist- [facility]  07/28/2021, 2:50 PM  [facility] [address] [place], [place], [address] Phone: [phone]   Fax:  [phone]  Name: Mike Holloway MRN: [id number] Date of Birth: 06-09-1941

## 2021-07-30 ENCOUNTER — Other Ambulatory Visit: Payer: Self-pay

## 2021-07-30 ENCOUNTER — Ambulatory Visit (INDEPENDENT_AMBULATORY_CARE_PROVIDER_SITE_OTHER): Payer: Medicare Other | Admitting: Physician Assistant

## 2021-07-30 DIAGNOSIS — R339 Retention of urine, unspecified: Secondary | ICD-10-CM | POA: Diagnosis not present

## 2021-07-30 NOTE — Progress Notes (Signed)
Cath Change/ Replacement  Patient is present today for a catheter change due to urinary retention.  31ml of water was removed from the balloon, a 16FR coude foley cath was removed without difficulty.  Patient was cleaned and prepped in a sterile fashion with betadine and 2% lidocaine jelly was instilled into the urethra. A 16 FR coude foley cath was replaced into the bladder no complications were noted Urine return was noted 93ml and urine was yellow in color. The balloon was filled with 34ml of sterile water. A leg bag was attached for drainage.  Patient tolerated well.    Performed by: Debroah Loop, PA-C   Follow up: Return in about 4 weeks (around 08/27/2021) for Catheter exchange.

## 2021-08-04 ENCOUNTER — Encounter: Payer: Self-pay | Admitting: Physical Therapy

## 2021-08-04 ENCOUNTER — Ambulatory Visit: Payer: Medicare Other | Admitting: Physical Therapy

## 2021-08-04 ENCOUNTER — Other Ambulatory Visit: Payer: Self-pay

## 2021-08-04 DIAGNOSIS — M6281 Muscle weakness (generalized): Secondary | ICD-10-CM

## 2021-08-04 DIAGNOSIS — R293 Abnormal posture: Secondary | ICD-10-CM

## 2021-08-04 DIAGNOSIS — R2689 Other abnormalities of gait and mobility: Secondary | ICD-10-CM

## 2021-08-04 DIAGNOSIS — R269 Unspecified abnormalities of gait and mobility: Secondary | ICD-10-CM

## 2021-08-04 NOTE — Therapy (Signed)
[facility] [address] [place], [place], [address] Phone: [phone]   Fax:  [phone]  Physical Therapy Treatment  Patient Details  Name: Mike Holloway MRN: [id number] Date of Birth: [birth year] Referring Provider (PT): Dr. Rudene Christians   Encounter Date: 08/04/2021   PT End of Session - 08/04/21 1306     Visit Number 130    Number of Visits 133    Date for PT Re-Evaluation 09/01/21    Authorization - Visit Number 1    Authorization - Number of Visits 10    PT Start Time 0932    PT Stop Time 1346    PT Time Calculation (min) 42 min    Equipment Utilized During Treatment Gait belt    Activity Tolerance Patient tolerated treatment well;Patient limited by pain;Patient limited by fatigue    Behavior During Therapy [facility] for tasks assessed/performed             Past Medical History:  Diagnosis Date   Arthritis    Atrial flutter ([place])    Diabetes mellitus ([place])    Essential tremor    Essential tremor    deep brain stimulator    Hypercholesteremia    Hypertension    Incontinence    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   SIADH (syndrome of inappropriate ADH production) ([place])    Sleep apnea    Stroke [facility])     Past Surgical History:  Procedure Laterality Date   ABLATION     APPENDECTOMY     CATARACT EXTRACTION, BILATERAL     COLONOSCOPY WITH PROPOFOL N/A 03/17/2018   Procedure: COLONOSCOPY WITH PROPOFOL;  Surgeon: Jonathon Bellows, MD;  Location: [facility] 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   Procedure: FECAL TRANSPLANT;  Surgeon: Jonathon Bellows, MD;  Location: [facility] ENDOSCOPY;  Service: Gastroenterology;  Laterality: N/A;   HEMORRHOID SURGERY     HERNIA REPAIR     HIP FRACTURE SURGERY     INTRAMEDULLARY (IM) NAIL INTERTROCHANTERIC Right 09/16/2018   Procedure: INTRAMEDULLARY (IM) NAIL INTERTROCHANTRIC;  Surgeon: Hessie Knows, MD;  Location: [facility] ORS;  Service: Orthopedics;  Laterality: Right;   IR CATHETER TUBE CHANGE  11/22/2018   NASAL SINUS SURGERY     ORCHIECTOMY     TONSILLECTOMY      There were no vitals filed for this visit.   Subjective Assessment - 08/04/21 1354     Subjective Pt states hip and knee pain ISQ. Pt states no falls since last tx. Pt presents to tx with biofreeze to bilat knee to increase activity tolerance.    Patient is accompained by: Family member    Pertinent History Pt. states R knee is bothering him but it is the typical discomfort he experiences    Limitations House hold activities;Walking;Standing;Lifting    How long can you sit comfortably? 2 hours    How long can you stand comfortably? <10 min    How long can you walk comfortably? 10 min    Patient Stated Goals Improve LE strength/ gait and balance with daily tasks.  Improve gait/ safety/ progression to least assistive device.    Currently in Pain? Yes    Pain Score 3     Pain Location Knee    Pain Orientation Right    Pain Onset More than a month ago  Treatment     Therapeutic Exercise:  // bars with CGA-SBA and gait belt donned. Verbal and contact cueing to facilitate optimal tissue loading and correct biomechanical alignment to ensure efficient and safe movement.  2x2l laps each movement unless other wise stated. Pt displayed marked reliance on // Bars for UE assist.  1) Marching with contralateral shoulder flexion     Standing Sticky note tapping with UE reaching and tapping with the contralat UE to facilitate large amp trunk rotation to facilitate great mobility were parkinsonism related deficits. 1 minute each movement. Verbal and contact cueing to facilitate optimal tissue loading and correct biomechanical alignment to ensure efficient and safe movement.  1) standard height target  2) R+L D1 PNF flexion ext pattern  3) R+L D2 PNF flexion ext pattern     NuStep L5 without UE assist  and MPH placed at the low back. Proper knee and hip exercises and mobility were dicussed with the patient to promote greater functional mobility and decreased pain. 10 min, not billed.   Frequent seated rest breaks provided throughout session.       PT Long Term Goals - 07/07/21 1301       PT LONG TERM GOAL #1   Title --    Baseline --    Time --    Period --    Status --    Target Date --      PT LONG TERM GOAL #2   Title Pt. will increase Berg balance test to >40 out of 56 to improve independence with gait/ decrease fall risk.    Baseline Berg: 22/56 (significant fall risk); 9/30 32/56.  11/10: 32 (limited by R knee pain)  2/9: 33/56 limited due to R knee and Low Back pain; 8/10: 29/56; 10/5: 38/56, 11/18/20: 28. 04/28/2021: 28/56 07/07/2021: 21/56    Time 8    Period Weeks    Status Partially Met    Target Date 09/01/21      PT LONG TERM GOAL #3   Title Pt. able to ambulate 100 feet with consistent 2 point gait pattern and use of least restrictive assistive devce to improve household mobility.    Baseline Pt. able to ambulate with use of RW/ CGA to min. A for safety.   Heavy use of B UE.; 9/30 pt able to ambulate with rollator with inconsistent 2 point gait pattern, heavy reliance on UE CGA/supervision  2/9: Pt. uses 2 point step pattern with walking with rollader with decreased stride length. Pt. would be limited due to pain in the knee walking for >100 ft.; 8/10: Pt demonstrates ability to ambulate 100' with consistent 2 point gait pattern with rollator. 11/18/20: Pt able to amb 130 ft with Rollator and 2-point gait pattern. 04/28/21: Pt able to amb 148 ft with Rollator and 2-point gait pattern 07/07/2021 Pt was able to amb 300 ft with rollator and 2 point gat pattern. Walking rollator is the least restricitive AD as this time. PT required several standing rest breaks.    Time 8    Period Weeks    Status Achieved    Target Date 07/07/21      PT LONG TERM GOAL #4   Title Pt. able  to stand from normal chair with no UE assist to improve safety/independence with transfers.     Baseline Unable to stand from standard chair without heavy UE assist. 10/6, pt unable to stand from standard chair without UE support.  11/10: benefits from 1 UE assist  2/9: Pt. can rise from normal chair with 1 UE assist and must control descent with UE assist.; 8/10: Pt requires 2 UE for standing from standard chair height.; 10/5: Requires single UE to stand. 11/18/20: Pt requires B UE support.  04/28/21: Pt requires single UE support 07/07/2021: Pt continues to require R UE, unable to complete without UE when attempted.    Time 8    Period Weeks    Status Partially Met    Target Date 09/01/21      PT LONG TERM GOAL #5   Title .      PT LONG TERM GOAL #8   Title Pt will demonstrate ability to stand from a lowered surface below 18" with single Ue support to demonstrate improved BLE strength to improve transfers from toilet.    Baseline 10/5: Able to stand from 18" chair with single UE support with use of momentum. 11/18/20: able to after 3 attempts. 10/11: Able to complete after 2 attempts with single UE assist.    Time 8    Period Weeks    Status Partially Met    Target Date 09/01/21                   Plan - 08/04/21 1348     Clinical Impression Statement Today's tx was focused on D1 and D1 unilat movement with cueing to facilitate large amp movement. Pt required prolonged seated 2/2 bilat hip and knee pain. Pt was able to complete all tasks without increase in LE joint pain. Pt continues to display mark global endurance, strength, and ROM deficits resulting in decreased safe home/community mobiity, increased pain, and limited access to QOL. Pt. will continue to benefit from skilled physical therapy to progress POC to address remaining deficits to facilitate maximum functional capacity for optimal personal health and wellness for ADLs.    Personal Factors and Comorbidities Age     Examination-Activity Limitations Toileting;Stand;Squat;Lift;Stairs    Stability/Clinical Decision Making Evolving/Moderate complexity    Clinical Decision Making Moderate    Rehab Potential Fair    PT Frequency 1x / week    PT Duration 8 weeks    PT Treatment/Interventions Moist Heat;Functional mobility training;Therapeutic activities;Therapeutic exercise;Balance training;Patient/family education;Manual techniques;Passive range of motion;Neuromuscular re-education;Stair training;Gait training;ADLs/Self Care Home Management    PT Next Visit Plan Progress LE strengthening and balance to reduce fall risk.    PT Home Exercise Plan [email or web address]    Consulted and Agree with Plan of Care Patient;Family member/caregiver    Family Member Consulted spouse: [name]             Patient will benefit from skilled therapeutic intervention in order to improve the following deficits and impairments:  Abnormal gait, Decreased endurance, Decreased activity tolerance, Pain, Decreased balance, Impaired flexibility, Decreased strength, Postural dysfunction  Visit Diagnosis: Muscle weakness (generalized)  Gait difficulty  Abnormal posture  Balance problem     Problem List Patient Active Problem List   Diagnosis Date Noted   DVT femoral (deep venous thrombosis) with thrombophlebitis, left (HCC) 02/06/2020   Lymphedema 04/15/2019   Bilateral leg edema 01/25/2019   Hyponatremia 12/24/2018   SIADH (syndrome of inappropriate ADH production) ([place]) 12/24/2018   Erosion of urethra due to catheterization of urinary tract ([place]) 10/27/2018   Generalized weakness 10/14/2018   Hip fracture ([place]) 09/15/2018   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   HLD (hyperlipidemia) 03/24/2018   Contusion of right knee 02/14/2018  Bradycardia 12/28/2017   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   Tremor  09/04/2016   Chronic pain of right knee 08/10/2016   Right ankle pain 08/10/2016   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[place]) 12/11/2015   Urinary retention 09/08/2015   Lymphoma, 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[place]) 04/03/2015   Breathlessness on exertion 11/21/2014   Breath shortness 11/21/2014   Arthropathy 11/07/2014   Atrial flutter, paroxysmal ([place]) 11/07/2014   Type 2 diabetes mellitus ([place]) 11/07/2014   Benign essential tremor 11/07/2014   Benign essential HTN 11/07/2014   Mixed incontinence 11/07/2014   Hypercholesterolemia without hypertriglyceridemia 11/07/2014   Apnea, sleep 11/07/2014   Controlled type 2 diabetes mellitus without complication ([place]) 85/50/1586   Pure hypercholesterolemia 11/07/2014   Other abnormalities of gait and mobility 11/02/2011   Decreased mobility 11/02/2011   Abnormal gait 06/29/2011   Discoordination 06/29/2011   Pura Spice, PT, DPT # [id number] Fara Olden, SPT 08/04/2021, 2:48 PM   [facility] [address] [place], [place], [address] Phone: [phone]   Fax:  [phone]  Name: Mike Holloway MRN: [id number] Date of Birth: 14-Feb-1941

## 2021-08-11 ENCOUNTER — Encounter: Payer: Medicare Other | Admitting: Physical Therapy

## 2021-08-12 ENCOUNTER — Ambulatory Visit: Payer: Medicare Other | Admitting: Physical Therapy

## 2021-08-12 ENCOUNTER — Encounter: Payer: Self-pay | Admitting: Physical Therapy

## 2021-08-12 ENCOUNTER — Other Ambulatory Visit: Payer: Self-pay

## 2021-08-12 DIAGNOSIS — R269 Unspecified abnormalities of gait and mobility: Secondary | ICD-10-CM

## 2021-08-12 DIAGNOSIS — R2689 Other abnormalities of gait and mobility: Secondary | ICD-10-CM

## 2021-08-12 DIAGNOSIS — R293 Abnormal posture: Secondary | ICD-10-CM

## 2021-08-12 DIAGNOSIS — M6281 Muscle weakness (generalized): Secondary | ICD-10-CM

## 2021-08-12 NOTE — Therapy (Addendum)
[facility] [address] [place], [place], [address] Phone: [phone]   Fax:  [phone]  Physical Therapy Treatment  Patient Details  Name: Mike Holloway MRN: [id number] Date of Birth: [birth year] Referring Provider (PT): Dr. Rudene Christians   Encounter Date: 08/12/2021   PT End of Session - 08/12/21 1345     Visit Number 131    Number of Visits 133    Date for PT Re-Evaluation 09/01/21    Authorization - Visit Number 2    Authorization - Number of Visits 10    PT Start Time 1101    PT Stop Time 2505    PT Time Calculation (min) 53 min    Equipment Utilized During Treatment Gait belt    Activity Tolerance Patient tolerated treatment well;Patient limited by pain;Patient limited by fatigue    Behavior During Therapy [facility] for tasks assessed/performed             Past Medical History:  Diagnosis Date   Arthritis    Atrial flutter ([place])    Diabetes mellitus ([place])    Essential tremor    Essential tremor    deep brain stimulator    Hypercholesteremia    Hypertension    Incontinence    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   SIADH (syndrome of inappropriate ADH production) ([place])    Sleep apnea    Stroke [facility])     Past Surgical History:  Procedure Laterality Date   ABLATION     APPENDECTOMY     CATARACT EXTRACTION, BILATERAL     COLONOSCOPY WITH PROPOFOL N/A 03/17/2018   Procedure: COLONOSCOPY WITH PROPOFOL;  Surgeon: Jonathon Bellows, MD;  Location: [facility] 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   Subjective Assessment - 08/12/21 1602     Subjective Pt presents to tx with no falls. Pt continues to state functional status ISQ. Pt states that his knee has continued to increase in pain freq and intensity. Pt states that he is considering a f/u with an ortho surgeon (Dr. Rudene Christians).    Patient is accompained by: Family member    Pertinent History Pt. states R knee is bothering him but it is the typical discomfort he experiences    Limitations House hold activities;Walking;Standing;Lifting    How long can you sit comfortably? 2 hours    How long can you stand comfortably? <10 min    How long can you walk comfortably? 10 min    Patient Stated Goals Improve LE strength/ gait and balance with daily tasks.  Improve gait/ safety/ progression to least assistive device.    Currently in Pain? Yes    Pain Score 4     Pain Location Knee    Pain Orientation Right  Pain Onset More than a month ago             Treatment     Neuromuscular Re-Education:   // bars with CGA-SBA and gait belt donned. Verbal and contact cueing to facilitate optimal tissue loading and correct biomechanical alignment to ensure efficient and safe movement.    1) Marching with contralateral shoulder flexion 1 mins x 3   Standing Sticky note tapping with UE reaching and tapping with the contralat UE to facilitate large amp trunk rotation to facilitate great mobility were parkinsonism related deficits. 1 minute each movement. Verbal and contact cueing to facilitate optimal tissue loading and correct biomechanical alignment to ensure efficient and safe movement.  1) standard height target  2) R+L D1 PNF flexion ext pattern  3) R+L D2 PNF flexion ext pattern   Seated LAQ with 4# ankle weights  bilat and visual targets to facilitate greater LE coordination through large amp movement.   Outdoor amp with [id number] across uneven surfaces. SBA with Gait belt donned. Verbal and contact cueing to facilitate optimal tissue loading and correct biomechanical alignment to ensure efficient and safe movement.    NuStep L5 without UE assist and MPH placed at the low back. Proper knee and hip exercises and mobility were dicussed with the patient to promote greater functional mobility and decreased pain. 10 min, not billed.   Frequent seated rest breaks provided throughout session.         PT Long Term Goals - 07/07/21 1301       PT LONG TERM GOAL #1   Title --    Baseline --    Time --    Period --    Status --    Target Date --      PT LONG TERM GOAL #2   Title Pt. will increase Berg balance test to >40 out of 56 to improve independence with gait/ decrease fall risk.    Baseline Berg: 22/56 (significant fall risk); 9/30 32/56.  11/10: 32 (limited by R knee pain)  2/9: 33/56 limited due to R knee and Low Back pain; 8/10: 29/56; 10/5: 38/56, 11/18/20: 28. 04/28/2021: 28/56 07/07/2021: 21/56    Time 8    Period Weeks    Status Partially Met    Target Date 09/01/21      PT LONG TERM GOAL #3   Title Pt. able to ambulate 100 feet with consistent 2 point gait pattern and use of least restrictive assistive devce to improve household mobility.    Baseline Pt. able to ambulate with use of RW/ CGA to min. A for safety.   Heavy use of B UE.; 9/30 pt able to ambulate with rollator with inconsistent 2 point gait pattern, heavy reliance on UE CGA/supervision  2/9: Pt. uses 2 point step pattern with walking with rollader with decreased stride length. Pt. would be limited due to pain in the knee walking for >100 ft.; 8/10: Pt demonstrates ability to ambulate 100' with consistent 2 point gait pattern with rollator. 11/18/20: Pt able to amb 130 ft with Rollator and 2-point gait pattern. 04/28/21: Pt able to amb  148 ft with Rollator and 2-point gait pattern 07/07/2021 Pt was able to amb 300 ft with rollator and 2 point gat pattern. Walking rollator is the least restricitive AD as this time. PT required several standing rest breaks.    Time 8    Period Weeks    Status Achieved    Target Date 07/07/21  PT LONG TERM GOAL #4   Title Pt. able to stand from normal chair with no UE assist to improve safety/independence with transfers.     Baseline Unable to stand from standard chair without heavy UE assist. 10/6, pt unable to stand from standard chair without UE support.  11/10: benefits from 1 UE assist    2/9: Pt. can rise from normal chair with 1 UE assist and must control descent with UE assist.; 8/10: Pt requires 2 UE for standing from standard chair height.; 10/5: Requires single UE to stand. 11/18/20: Pt requires B UE support.  04/28/21: Pt requires single UE support 07/07/2021: Pt continues to require R UE, unable to complete without UE when attempted.    Time 8    Period Weeks    Status Partially Met    Target Date 09/01/21      PT LONG TERM GOAL #5   Title .      PT LONG TERM GOAL #8   Title Pt will demonstrate ability to stand from a lowered surface below 18" with single Ue support to demonstrate improved BLE strength to improve transfers from toilet.    Baseline 10/5: Able to stand from 18" chair with single UE support with use of momentum. 11/18/20: able to after 3 attempts. 10/11: Able to complete after 2 attempts with single UE assist.    Time 8    Period Weeks    Status Partially Met    Target Date 09/01/21                   Plan - 08/12/21 1346     Clinical Impression Statement Pt tolerated tx well today, however, displayed increased activity limitation 2/2 to progression of R knee pain. All treatment was completed with gait belt donned 2/2 to occ  LOB, especially during rotation/ reaching for sticky note exercises. Pt continues to display mark global endurance, strength, and  ROM deficits resulting in decreased safe home/community mobiity, increased pain, and limited access to QOL. Pt. will continue to benefit from skilled physical therapy to progress POC to address remaining deficits to facilitate maximum functional capacity for optimal personal health and wellness for ADLs.    Personal Factors and Comorbidities Age    Examination-Activity Limitations Toileting;Stand;Squat;Lift;Stairs    Stability/Clinical Decision Making Evolving/Moderate complexity    Clinical Decision Making Moderate    Rehab Potential Fair    PT Frequency 1x / week    PT Duration 8 weeks    PT Treatment/Interventions Moist Heat;Functional mobility training;Therapeutic activities;Therapeutic exercise;Balance training;Patient/family education;Manual techniques;Passive range of motion;Neuromuscular re-education;Stair training;Gait training;ADLs/Self Care Home Management    PT Next Visit Plan Progress LE strengthening and balance to reduce fall risk.    PT Home Exercise Plan [email or web address]    Consulted and Agree with Plan of Care Patient;Family member/caregiver    Family Member Consulted spouse: [name]             Patient will benefit from skilled therapeutic intervention in order to improve the following deficits and impairments:  Abnormal gait, Decreased endurance, Decreased activity tolerance, Pain, Decreased balance, Impaired flexibility, Decreased strength, Postural dysfunction  Visit Diagnosis: Muscle weakness (generalized)  Gait difficulty  Balance problem  Abnormal posture     Problem List Patient Active Problem List   Diagnosis Date Noted   DVT femoral (deep venous thrombosis) with thrombophlebitis, left (HCC) 02/06/2020   Lymphedema 04/15/2019   Bilateral leg edema 01/25/2019   Hyponatremia 12/24/2018   SIADH (syndrome of inappropriate  ADH production) ([place]) 12/24/2018   Erosion of urethra due to catheterization of urinary tract ([place]) 10/27/2018   Generalized weakness  10/14/2018   Hip fracture ([place]) 09/15/2018   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   HLD (hyperlipidemia) 03/24/2018   Contusion of right knee 02/14/2018   Bradycardia 12/28/2017   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   Tremor 09/04/2016   Chronic pain of right knee 08/10/2016   Right ankle pain 08/10/2016   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[place]) 12/11/2015   Urinary retention 09/08/2015   Lymphoma, 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[place]) 04/03/2015   Breathlessness on exertion 11/21/2014   Breath shortness 11/21/2014   Arthropathy 11/07/2014   Atrial flutter, paroxysmal ([place]) 11/07/2014   Type 2 diabetes mellitus ([place]) 11/07/2014   Benign essential tremor 11/07/2014   Benign essential HTN 11/07/2014   Mixed incontinence 11/07/2014   Hypercholesterolemia without hypertriglyceridemia 11/07/2014   Apnea, sleep 11/07/2014   Controlled type 2 diabetes mellitus without complication ([place]) 54/62/7035   Pure hypercholesterolemia 11/07/2014   Other abnormalities of gait and mobility 11/02/2011   Decreased mobility 11/02/2011   Abnormal gait 06/29/2011   Discoordination 06/29/2011   Pura Spice, PT, DPT # [id number] Fara Olden, SPT 08/13/2021, 8:47 AM  [facility] [address] [place], [place], [address] Phone: [phone]   Fax:  [phone]  Name: Mike Holloway MRN: [id number] Date of Birth: 1941/05/02

## 2021-08-18 ENCOUNTER — Other Ambulatory Visit: Payer: Self-pay

## 2021-08-18 ENCOUNTER — Encounter: Payer: Self-pay | Admitting: Physical Therapy

## 2021-08-18 ENCOUNTER — Ambulatory Visit: Payer: Medicare Other

## 2021-08-18 DIAGNOSIS — M6281 Muscle weakness (generalized): Secondary | ICD-10-CM

## 2021-08-18 DIAGNOSIS — R269 Unspecified abnormalities of gait and mobility: Secondary | ICD-10-CM

## 2021-08-18 DIAGNOSIS — R293 Abnormal posture: Secondary | ICD-10-CM

## 2021-08-18 DIAGNOSIS — R2689 Other abnormalities of gait and mobility: Secondary | ICD-10-CM

## 2021-08-18 NOTE — Therapy (Signed)
[place] [facility] [address] [place], [place], [address] Phone: [phone]   Fax:  [phone]  Physical Therapy Treatment  Patient Details  Name: Mike Holloway MRN: [id number] Date of Birth: [birth year] Referring Provider (PT): Dr. Rudene Christians   Encounter Date: 08/18/2021   PT End of Session - 08/18/21 1412     Visit Number 132    Number of Visits 133    Date for PT Re-Evaluation 09/01/21    Authorization - Visit Number 3    Authorization - Number of Visits 10    PT Start Time 1829    PT Stop Time 9371    PT Time Calculation (min) 46 min    Equipment Utilized During Treatment Gait belt    Activity Tolerance Patient tolerated treatment well;Patient limited by pain;Patient limited by fatigue    Behavior During Therapy [facility] for tasks assessed/performed             Past Medical History:  Diagnosis Date   Arthritis    Atrial flutter ([place])    Diabetes mellitus ([place])    Essential tremor    Essential tremor    deep brain stimulator    Hypercholesteremia    Hypertension    Incontinence    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   Procedure: FECAL TRANSPLANT;  Surgeon: Jonathon Bellows, MD;  [facility] ENDOSCOPY;  Service: Gastroenterology;  Laterality: N/A;   HEMORRHOID SURGERY     HERNIA REPAIR     HIP FRACTURE SURGERY     INTRAMEDULLARY (IM) NAIL INTERTROCHANTERIC Right 09/16/2018   Procedure: INTRAMEDULLARY (IM) NAIL INTERTROCHANTRIC;  Surgeon: Hessie Knows, MD;  Location: [facility] ORS;  Service: Orthopedics;  Laterality: Right;   IR CATHETER TUBE CHANGE  11/22/2018   NASAL SINUS SURGERY     ORCHIECTOMY     TONSILLECTOMY      There were no vitals filed for this visit.   Subjective Assessment - 08/18/21 1345     Subjective Pt presents to PT with no falls reported. Reports pain in knee is better this week.    Patient is accompained by: Family member    Pertinent History Pt. states R knee is bothering him but it is the typical discomfort he experiences    Limitations House hold activities;Walking;Standing;Lifting    How long can you sit comfortably? 2 hours    How long can you stand comfortably? <10 min    How long can you walk comfortably? 10 min    Patient Stated Goals Improve LE strength/ gait and balance with daily tasks.  Improve gait/ safety/ progression to least assistive device.    Currently in Pain? Yes    Pain Score 4     Pain Location Knee    Pain Descriptors / Indicators Aching    Pain Type Chronic pain    Pain Onset More than a month ago    Pain Frequency Intermittent  Treatment today:   Nu-Step 5 min warm up, 5 min post treatment, not billed. MHP provided for lumbar for comfort. Not billed.   Neuro Re-ED:   Resisted 4 way walking: x5/direction with SBA, heavy UE reliance for lateral stepping   Isometric BTB at torso while contralaterally reaching for blue sticky note across body up high then down low for postural stability: x10/side   Step through hurdle step over with BUE support on // bars: 2 laps with CGA   Pt requires frequent seated rest throughout session due to minor Sob and LE fatigue and R hip pain.     PT Education - 08/18/21 1403     Education Details form/technique with exercise    Person(s) Educated Patient    Methods Explanation;Demonstration;Tactile cues;Verbal cues    Comprehension Verbalized understanding;Returned demonstration;Verbal cues required;Tactile cues required                  PT Long Term Goals - 07/07/21 1301       PT LONG TERM GOAL #1   Title --    Baseline --    Time --    Period --    Status --    Target Date --      PT LONG TERM GOAL #2   Title Pt. will increase Berg balance test to >40 out of 56 to improve independence with gait/ decrease fall risk.    Baseline Berg: 22/56 (significant fall risk); 9/30 32/56.  11/10: 32 (limited by R knee pain)  2/9: 33/56 limited due to R knee and Low Back pain; 8/10: 29/56; 10/5: 38/56, 11/18/20: 28. 04/28/2021: 28/56 07/07/2021: 21/56    Time 8    Period Weeks    Status Partially Met    Target Date 09/01/21      PT LONG TERM GOAL #3   Title Pt. able to ambulate 100 feet with consistent 2 point gait pattern and use of least restrictive assistive devce to improve household mobility.    Baseline Pt. able to ambulate with use of RW/ CGA to min. A for safety.   Heavy use of B UE.; 9/30 pt able to ambulate with rollator with inconsistent 2 point gait pattern, heavy reliance on UE CGA/supervision  2/9: Pt. uses 2 point step pattern with walking with rollader with decreased stride length. Pt. would be limited due to pain in the knee walking for >100 ft.; 8/10: Pt demonstrates ability to ambulate 100' with consistent 2 point gait pattern with rollator. 11/18/20: Pt able to amb 130 ft with Rollator and 2-point gait pattern. 04/28/21: Pt able to amb 148 ft with Rollator and 2-point gait pattern 07/07/2021 Pt was able to amb 300 ft with rollator and 2 point gat pattern. Walking rollator is the least restricitive AD as this time. PT required several standing rest breaks.    Time 8    Period Weeks    Status Achieved    Target Date 07/07/21      PT LONG TERM GOAL #4   Title Pt. able to stand from normal chair with no UE assist to improve safety/independence with transfers.     Baseline Unable to stand from standard chair without heavy UE assist. 10/6, pt unable to stand from standard chair without UE support.   11/10: benefits from 1 UE assist    2/9: Pt. can rise from normal chair with 1 UE assist and must control descent with UE assist.; 8/10: Pt requires 2 UE for standing from standard  chair height.; 10/5: Requires single UE to stand. 11/18/20: Pt requires B UE support.  04/28/21: Pt requires single UE support 07/07/2021: Pt continues to require R UE, unable to complete without UE when attempted.    Time 8    Period Weeks    Status Partially Met    Target Date 09/01/21      PT LONG TERM GOAL #5   Title .      PT LONG TERM GOAL #8   Title Pt will demonstrate ability to stand from a lowered surface below 18" with single Ue support to demonstrate improved BLE strength to improve transfers from toilet.    Baseline 10/5: Able to stand from 18" chair with single UE support with use of momentum. 11/18/20: able to after 3 attempts. 10/11: Able to complete after 2 attempts with single UE assist.    Time 8    Period Weeks    Status Partially Met    Target Date 09/01/21                   Plan - 08/18/21 1412     Clinical Impression Statement Pt tolerated progression of dynamic LE and postural stability exercises with no overt LOB but limited by hip and LBP. Continues to rely on BUE support to maintain balance particularly with frontal plane motions due to weak hip abductors. Will continue to benefit from skilled PT services to maintain current level of function and reduce risk of falls.    Personal Factors and Comorbidities Age    Examination-Activity Limitations Toileting;Stand;Squat;Lift;Stairs    Stability/Clinical Decision Making Evolving/Moderate complexity    Rehab Potential Fair    PT Frequency 1x / week    PT Duration 8 weeks    PT Treatment/Interventions Moist Heat;Functional mobility training;Therapeutic activities;Therapeutic exercise;Balance training;Patient/family education;Manual techniques;Passive range of motion;Neuromuscular re-education;Stair training;Gait training;ADLs/Self Care  Home Management    PT Next Visit Plan Progress LE strengthening and balance to reduce fall risk.    PT Home Exercise Plan [id number]    Consulted and Agree with Plan of Care Patient;Family member/caregiver    Family Member Consulted spouse: [name]             Patient will benefit from skilled therapeutic intervention in order to improve the following deficits and impairments:  Abnormal gait, Decreased endurance, Decreased activity tolerance, Pain, Decreased balance, Impaired flexibility, Decreased strength, Postural dysfunction  Visit Diagnosis: Muscle weakness (generalized)  Gait difficulty  Balance problem  Abnormal posture     Problem List Patient Active Problem List   Diagnosis Date Noted   DVT femoral (deep venous thrombosis) with thrombophlebitis, left (HCC) 02/06/2020   Lymphedema 04/15/2019   Bilateral leg edema 01/25/2019   Hyponatremia 12/24/2018   SIADH (syndrome of inappropriate ADH production) ([place]) 12/24/2018   Erosion of urethra due to catheterization of urinary tract ([place]) 10/27/2018   Generalized weakness 10/14/2018   Hip fracture ([place]) 09/15/2018   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   HLD (hyperlipidemia) 03/24/2018   Contusion of right knee 02/14/2018   Bradycardia 12/28/2017   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   Tremor 09/04/2016   Chronic pain of right knee 08/10/2016   Right ankle pain 08/10/2016   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[place]) 12/11/2015   Urinary retention 09/08/2015   Lymphoma, 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[place]) 04/03/2015   Breathlessness on exertion 11/21/2014   Breath shortness 11/21/2014   Arthropathy 11/07/2014  Atrial flutter, paroxysmal ([place]) 11/07/2014   Type 2 diabetes mellitus ([place]) 11/07/2014   Benign essential tremor 11/07/2014   Benign essential HTN 11/07/2014   Mixed  incontinence 11/07/2014   Hypercholesterolemia without hypertriglyceridemia 11/07/2014   Apnea, sleep 11/07/2014   Controlled type 2 diabetes mellitus without complication ([place]) 61/53/7943   Pure hypercholesterolemia 11/07/2014   Other abnormalities of gait and mobility 11/02/2011   Decreased mobility 11/02/2011   Abnormal gait 06/29/2011   Discoordination 06/29/2011    Salem Caster. Fairly IV, PT, DPT Physical Therapist- [facility]  08/18/2021, 3:44 PM  [place] [facility] [address] [place], [place], [address] Phone: [phone]   Fax:  [phone]  Name: Mike Holloway MRN: [id number] Date of Birth: 1941-02-14

## 2021-08-25 ENCOUNTER — Other Ambulatory Visit: Payer: Self-pay

## 2021-08-25 ENCOUNTER — Encounter: Payer: Self-pay | Admitting: Physical Therapy

## 2021-08-25 ENCOUNTER — Ambulatory Visit: Payer: Medicare Other | Admitting: Physical Therapy

## 2021-08-25 DIAGNOSIS — R293 Abnormal posture: Secondary | ICD-10-CM

## 2021-08-25 DIAGNOSIS — R269 Unspecified abnormalities of gait and mobility: Secondary | ICD-10-CM

## 2021-08-25 DIAGNOSIS — M6281 Muscle weakness (generalized): Secondary | ICD-10-CM

## 2021-08-25 DIAGNOSIS — R2689 Other abnormalities of gait and mobility: Secondary | ICD-10-CM

## 2021-08-25 NOTE — Therapy (Addendum)
[place] [facility] [address] [place], [place], [address] Phone: [phone]   Fax:  [phone]  Physical Therapy Treatment/Recertification  Patient Details  Name: Mike Holloway MRN: [id number] Date of Birth: [birth year] Referring Provider (PT): Dr. Rudene Christians   Encounter Date: 08/25/2021   Encounter Date: 08/25/2021   PT End of Session - 08/25/21 1258     Visit Number 133    Number of Visits 141    Date for PT Re-Evaluation 10/20/21    Authorization - Visit Number 4    Authorization - Number of Visits 10    PT Start Time 1300    PT Stop Time 1404    PT Time Calculation (min) 64 min    Equipment Utilized During Treatment Gait belt    Activity Tolerance Patient tolerated treatment well;Patient limited by pain;Patient limited by fatigue    Behavior During Therapy [facility] for tasks assessed/performed             Past Medical History:  Diagnosis Date   Arthritis    Atrial flutter ([place])    Diabetes mellitus ([place])    Essential tremor    Essential tremor    deep brain stimulator    Hypercholesteremia    Hypertension    Incontinence    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   Subjective Assessment - 08/25/21 1300     Subjective Pt was able to ascend/ descend stairs at son's house for Thanksgiving with light assist for son safely.  Pt. saw Dr. Caryl Comes and had toe nail on L foot trimmed and is very sore today.  Pt. soaked foot in epsom salt water prior to PT tx. session.  R medial/lateral knee joint line pain present with walking.  Pt. c/o R hip pain with R sidelying at night and is planning to discuss with Dr. Rudene Christians.    Patient is accompained by: Family member    Pertinent History Pt. states R knee is bothering him but it is the typical discomfort he experiences    Limitations House hold activities;Walking;Standing;Lifting    How long can you sit comfortably? 2 hours    How long can you stand comfortably? <10 min    How long can you walk comfortably? 10 min    Patient Stated Goals Improve LE strength/ gait and balance  with daily tasks.  Improve gait/ safety/ progression to least assistive device.    Currently in Pain? Yes    Pain Score 2     Pain Location Knee    Pain Orientation Right;Medial;Lateral    Pain Descriptors / Indicators Aching    Pain Type Chronic pain    Pain Onset More than a month ago             Neuro Re-ED:   Forward/lateral walking in //-bars with UE assist required due to knee/ L great toe pain.  4x each.  Berg: 31/56 (limited by knee pain/ UE assist for safety/ extra time).    Walking with alt. UE/LE touches in //-bars.    Walking with hurdles step overs (6").    Resisted 4 way walking: x5/direction with SBA, heavy UE reliance for lateral stepping              Isometric BTB at torso while contralaterally reaching for blue sticky note across body up high then down low  for postural stability: x10/side  (//-bars with mirror)                            Ther.ex.:   Nustep B LE only L5 10 min., post treatment, not billed. MHP provided for lumbar for comfort.   Seated marching/ LAQ 20x.  Reviewed HEP       Pt requires frequent seated rest throughout session due to minor SOB and LE fatigue and R hip/knee pain.       PT Long Term Goals - 08/31/21 1729       PT LONG TERM GOAL #2   Title Pt. will increase Berg balance test to >40 out of 56 to improve independence with gait/ decrease fall risk.    Baseline Berg: 22/56 (significant fall risk); 9/30 32/56.  11/10: 32 (limited by R knee pain)  2/9: 33/56 limited due to R knee and Low Back pain; 8/10: 29/56; 10/5: 38/56, 11/18/20: 28. 04/28/2021: 28/56 07/07/2021: 21/56.  11/29: 31/56 (limited by knee pain/ use of UE assist).    Time 8    Period Weeks    Status Partially Met    Target Date 10/20/21      PT LONG TERM GOAL #3   Title Pt. able to ambulate 100 feet with consistent 2 point gait pattern and use of least restrictive assistive devce to improve household mobility.    Baseline Pt. able to ambulate with use of RW/ CGA to min. A for safety.   Heavy use of B UE.; 9/30 pt able to ambulate with rollator with inconsistent 2 point gait pattern, heavy reliance on UE CGA/supervision  2/9: Pt. uses 2 point step pattern with walking with rollader with decreased stride length. Pt. would be limited due to pain in the knee walking for >100 ft.; 8/10: Pt demonstrates ability to ambulate 100' with consistent 2 point gait pattern with rollator. 11/18/20: Pt able to amb 130 ft with Rollator and 2-point gait pattern. 04/28/21: Pt able to amb 148 ft with Rollator and 2-point gait pattern 07/07/2021 Pt was able to amb 300 ft with rollator and 2 point gat pattern. Walking rollator is the least restricitive AD as this time. PT required several standing rest breaks.    Time 8    Period Weeks    Status Achieved    Target Date 07/07/21       PT LONG TERM GOAL #4  Title Pt. able to stand from normal chair with no UE assist to improve safety/independence with transfers.     Baseline Unable to stand from standard chair without heavy UE assist. 10/6, pt unable to stand from standard chair without UE support.  11/10: benefits from 1 UE assist    2/9: Pt. can rise from normal chair with 1 UE assist and must control descent with UE assist.; 8/10: Pt requires 2 UE for standing from standard chair height.; 10/5: Requires single UE to stand. 11/18/20: Pt requires B UE support.  04/28/21: Pt requires single UE support 07/07/2021: Pt continues to require R UE, unable to complete without UE when attempted.    Time 8    Period Weeks    Status Partially Met    Target Date 10/20/21      PT LONG TERM GOAL #5   Title .      PT LONG TERM GOAL #8   Title Pt will demonstrate ability to stand from a lowered surface below 18" with single Ue support to demonstrate improved BLE strength to improve transfers from toilet.    Baseline 10/5: Able to stand from 18" chair with single UE support with use of momentum. 11/18/20: able to after 3 attempts. 10/11: Able to complete after 2 attempts with single UE assist.    Time 8    Period Weeks    Status Partially Met    Target Date 10/20/21              Pt's session with focus on functional strength/balance activities to improve postural instability and improved tolerance in standing with reduced hand support to assist in standing ADL's. Pt still requires at least single UE support on // bars for all activities due to balance deficits/ knee pain. Pt. able to tolerate progression of core rotational activities with improved stability with wider BOS. Pt still has intermittent x2 post LOB with end range rotations requiring minA from PT to correct. Pt did require intermittent need for seated rest due to hip/knee pain today but this is pt's normal. Pt will continue to benefit from skilled PT services to improve  dynamic balance and tolerance for standing/walking activities to optimize independence with ADL's and reduce risk of falls. See updated PT goals.       Patient will benefit from skilled therapeutic intervention in order to improve the following deficits and impairments:  Abnormal gait, Decreased endurance, Decreased activity tolerance, Pain, Decreased balance, Impaired flexibility, Decreased strength, Postural dysfunction  Visit Diagnosis: Muscle weakness (generalized)  Gait difficulty  Balance problem  Abnormal posture     Problem List Patient Active Problem List   Diagnosis Date Noted   DVT femoral (deep venous thrombosis) with thrombophlebitis, left (HCC) 02/06/2020   Lymphedema 04/15/2019   Bilateral leg edema 01/25/2019   Hyponatremia 12/24/2018   SIADH (syndrome of inappropriate ADH production) ([place]) 12/24/2018   Erosion of urethra due to catheterization of urinary tract ([place]) 10/27/2018   Generalized weakness 10/14/2018   Hip fracture ([place]) 09/15/2018   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   HLD (hyperlipidemia) 03/24/2018   Contusion of right knee 02/14/2018   Bradycardia 12/28/2017   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   Tremor 09/04/2016   Chronic pain of right knee 08/10/2016   Right ankle pain 08/10/2016   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[place]) 12/11/2015   Urinary retention 09/08/2015   Lymphoma,  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[place]) 04/03/2015   Breathlessness on exertion 11/21/2014   Breath shortness 11/21/2014   Arthropathy 11/07/2014   Atrial flutter, paroxysmal ([place]) 11/07/2014   Type 2 diabetes mellitus ([place]) 11/07/2014   Benign essential tremor 11/07/2014   Benign essential HTN 11/07/2014   Mixed incontinence 11/07/2014   Hypercholesterolemia without hypertriglyceridemia 11/07/2014   Apnea, sleep 11/07/2014    Controlled type 2 diabetes mellitus without complication ([place]) 16/75/6125   Pure hypercholesterolemia 11/07/2014   Other abnormalities of gait and mobility 11/02/2011   Decreased mobility 11/02/2011   Abnormal gait 06/29/2011   Discoordination 06/29/2011   Pura Spice, PT, DPT # [id number] 08/31/2021, 5:36 PM  [facility] [address] [place], [place], [address] Phone: [phone]   Fax:  [phone]  Name: Dashaun Onstott MRN: [id number] Date of Birth: 1940/10/21

## 2021-08-26 ENCOUNTER — Ambulatory Visit (INDEPENDENT_AMBULATORY_CARE_PROVIDER_SITE_OTHER): Payer: Medicare Other | Admitting: Physician Assistant

## 2021-08-26 DIAGNOSIS — R339 Retention of urine, unspecified: Secondary | ICD-10-CM

## 2021-08-26 NOTE — Progress Notes (Signed)
Cath Change/ Replacement  Patient is present today for a catheter change due to urinary retention.  54ml of water was removed from the balloon, a 16FR coude foley cath was removed without difficulty.  Patient was cleaned and prepped in a sterile fashion with betadine. A 16 FR coude foley cath was replaced into the bladder no complications were noted Urine return was noted 65ml and urine was yellow in color. The balloon was filled with 26ml of sterile water. A leg bag was attached for drainage.  Patient tolerated well.    Performed by: Debroah Loop, PA-C   Follow up: Return in about 4 weeks (around 09/23/2021) for Catheter exchange.

## 2021-08-31 NOTE — Addendum Note (Signed)
Addended by: Pura Spice on: 08/31/2021 05:43 PM   Modules accepted: Orders

## 2021-09-01 ENCOUNTER — Ambulatory Visit: Payer: Medicare Other | Attending: Orthopedic Surgery | Admitting: Physical Therapy

## 2021-09-01 ENCOUNTER — Other Ambulatory Visit: Payer: Self-pay

## 2021-09-01 ENCOUNTER — Encounter: Payer: Self-pay | Admitting: Physical Therapy

## 2021-09-01 DIAGNOSIS — R269 Unspecified abnormalities of gait and mobility: Secondary | ICD-10-CM | POA: Diagnosis present

## 2021-09-01 DIAGNOSIS — R2689 Other abnormalities of gait and mobility: Secondary | ICD-10-CM | POA: Insufficient documentation

## 2021-09-01 DIAGNOSIS — R293 Abnormal posture: Secondary | ICD-10-CM | POA: Diagnosis present

## 2021-09-01 DIAGNOSIS — M6281 Muscle weakness (generalized): Secondary | ICD-10-CM | POA: Diagnosis present

## 2021-09-01 NOTE — Therapy (Signed)
[facility] [address] [place], [place], [address] Phone: [phone]   Fax:  [phone]  Physical Therapy Treatment  Patient Details  Name: Mike Holloway MRN: [id number] Date of Birth: Oct 16, 1940 Referring Provider (PT): Dr. Rudene Christians   Encounter Date: 09/01/2021   PT End of Session - 09/01/21 1609     Visit Number 134    Number of Visits 141    Date for PT Re-Evaluation 10/20/21    Authorization - Visit Number 5    Authorization - Number of Visits 10    PT Start Time 8875    PT Stop Time 1356    PT Time Calculation (min) 49 min    Equipment Utilized During Treatment Gait belt    Activity Tolerance Patient tolerated treatment well;Patient limited by pain;Patient limited by fatigue    Behavior During Therapy [facility] for tasks assessed/performed             Past Medical History:  Diagnosis Date   Arthritis    Atrial flutter ([place])    Diabetes mellitus ([place])    Essential tremor    Essential tremor    deep brain stimulator    Hypercholesteremia    Hypertension    Incontinence    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   SIADH (syndrome of inappropriate ADH production) ([place])    Sleep apnea    Stroke [facility])     Past Surgical History:  Procedure Laterality Date   ABLATION     APPENDECTOMY     CATARACT EXTRACTION, BILATERAL     COLONOSCOPY WITH PROPOFOL N/A 03/17/2018   Procedure: COLONOSCOPY WITH PROPOFOL;  Surgeon: Jonathon Bellows, MD;  Location: [facility] 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   Subjective Assessment - 09/01/21 1321     Subjective Patient reports no recent falls or safety concerns at home. Patient reports no major updates at arrival to PT today. Patient reports feeling "about the same." He did follow-up with physician for new Rx after episode of epistaxis.    Patient is accompained by: Family member    Pertinent History Pt. states R knee is bothering him but it is the typical discomfort he experiences    Limitations House hold activities;Walking;Standing;Lifting    How long can you sit comfortably? 2 hours    How long can you stand comfortably? <10 min    How long can you walk comfortably? 10 min    Patient Stated Goals Improve LE strength/ gait and balance with daily tasks.  Improve gait/ safety/ progression to least assistive device.    Pain Onset More than a month ago    Pain Onset More than a month ago  Neuro Re-ED:    Forward to retro/lateral walking in //-bars with UE assist; 4x each.   Walking with alt. UE/LE touches in //-bars. 3x D/B   Resisted 2 way walking (forward and backward steps with double-black tubing around waist): x5/direction with SBA  Toe tapping, 6-inch step, unilateral UE support alternating R/L; 2x10 (touching ipsilateral to stance limb on each side)             *next visit* Walking with hurdles step overs (6").                                Ther.ex.:   Nustep B LE only L5 10 min., post treatment, not billed. MHP provided for lumbar for comfort.   Patient education: Reviewed patient's home exercise program and provided printout for exercises established with primary PT     *not today* Isometric BTB at torso while contralaterally  reaching for blue sticky note across body up high then down low for postural stability: x10/side  (//-bars with mirror) Seated marching/ LAQ 20x.        Pt requires frequent seated rest throughout session due to minor SOB and LE fatigue     Focused on dynamic balance activities, stepping drills, and reducing UE support as able through today's session. He has significant difficulty with performing stepping and toe tapping with decreased upper limb support with increasing intention tremor following fatigue. Attempted toe tapping with R upper limb support only (mimicking unilateral UE support during stair negotiation), but patient has to alternate upper extremity support (from R to L side) due to ongoing postural control deficits. Patient will benefit from further strategies to improve CKC strength required to perform transfer from standard chair and toilet. Pt will continue to benefit from skilled PT services to improve dynamic balance and tolerance for standing/walking activities to optimize independence with ADL's and reduce risk of falls.     PT Long Term Goals - 08/31/21 1729       PT LONG TERM GOAL #2   Title Pt. will increase Berg balance test to >40 out of 56 to improve independence with gait/ decrease fall risk.    Baseline Berg: 22/56 (significant fall risk); 9/30 32/56.  11/10: 32 (limited by R knee pain)  2/9: 33/56 limited due to R knee and Low Back pain; 8/10: 29/56; 10/5: 38/56, 11/18/20: 28. 04/28/2021: 28/56 07/07/2021: 21/56.  11/29: 31/56 (limited by knee pain/ use of UE assist).    Time 8    Period Weeks    Status Partially Met    Target Date 10/20/21      PT LONG TERM GOAL #3   Title Pt. able to ambulate 100 feet with consistent 2 point gait pattern and use of least restrictive assistive devce to improve household mobility.    Baseline Pt. able to ambulate with use of RW/ CGA to min. A for safety.   Heavy use of B UE.; 9/30 pt able to ambulate with rollator with inconsistent  2 point gait pattern, heavy reliance on UE CGA/supervision  2/9: Pt. uses 2 point step pattern with walking with rollader with decreased stride length. Pt. would be limited due to pain in the knee walking for >100 ft.; 8/10: Pt demonstrates ability to ambulate 100' with consistent 2 point gait pattern with rollator. 11/18/20: Pt able to amb 130 ft with Rollator and 2-point gait pattern. 04/28/21: Pt able to amb 148 ft with Rollator and  2-point gait pattern 07/07/2021 Pt was able to amb 300 ft with rollator and 2 point gat pattern. Walking rollator is the least restricitive AD as this time. PT required several standing rest breaks.    Time 8    Period Weeks    Status Achieved    Target Date 07/07/21      PT LONG TERM GOAL #4   Title Pt. able to stand from normal chair with no UE assist to improve safety/independence with transfers.     Baseline Unable to stand from standard chair without heavy UE assist. 10/6, pt unable to stand from standard chair without UE support.  11/10: benefits from 1 UE assist    2/9: Pt. can rise from normal chair with 1 UE assist and must control descent with UE assist.; 8/10: Pt requires 2 UE for standing from standard chair height.; 10/5: Requires single UE to stand. 11/18/20: Pt requires B UE support.  04/28/21: Pt requires single UE support 07/07/2021: Pt continues to require R UE, unable to complete without UE when attempted.    Time 8    Period Weeks    Status Partially Met    Target Date 10/20/21      PT LONG TERM GOAL #5   Title .      PT LONG TERM GOAL #8   Title Pt will demonstrate ability to stand from a lowered surface below 18" with single Ue support to demonstrate improved BLE strength to improve transfers from toilet.    Baseline 10/5: Able to stand from 18" chair with single UE support with use of momentum. 11/18/20: able to after 3 attempts. 10/11: Able to complete after 2 attempts with single UE assist.    Time 8    Period Weeks    Status Partially Met     Target Date 10/20/21                   Plan - 09/01/21 1617     Clinical Impression Statement Focused on dynamic balance activities, stepping drills, and reducing UE support as able through today's session. He has significant difficulty with performing stepping and toe tapping with decreased upper limb support with increasing intention tremor following fatigue. Attempted toe tapping with R upper limb support only (mimicking unilateral UE support during stair negotiation), but patient has to alternate upper extremity support (from R to L side) due to ongoing postural control deficits. Patient will benefit from further strategies to improve CKC strength required to perform transfer from standard chair and toilet. Pt will continue to benefit from skilled PT services to improve dynamic balance and tolerance for standing/walking activities to optimize independence with ADL's and reduce risk of falls.    Personal Factors and Comorbidities Age    Examination-Activity Limitations Toileting;Stand;Squat;Lift;Stairs    Stability/Clinical Decision Making Evolving/Moderate complexity    Rehab Potential Fair    PT Frequency 1x / week    PT Duration 8 weeks    PT Treatment/Interventions Moist Heat;Functional mobility training;Therapeutic activities;Therapeutic exercise;Balance training;Patient/family education;Manual techniques;Passive range of motion;Neuromuscular re-education;Stair training;Gait training;ADLs/Self Care Home Management    PT Next Visit Plan Progress LE strengthening and balance to reduce fall risk.    PT Home Exercise Plan [email or web address]    Consulted and Agree with Plan of Care Patient;Family member/caregiver    Family Member Consulted spouse: [name]             Patient will benefit from skilled therapeutic intervention in order to improve the following deficits  and impairments:  Abnormal gait, Decreased endurance, Decreased activity tolerance, Pain, Decreased balance, Impaired  flexibility, Decreased strength, Postural dysfunction  Visit Diagnosis: Muscle weakness (generalized)  Gait difficulty  Balance problem  Abnormal posture     Problem List Patient Active Problem List   Diagnosis Date Noted   DVT femoral (deep venous thrombosis) with thrombophlebitis, left (HCC) 02/06/2020   Lymphedema 04/15/2019   Bilateral leg edema 01/25/2019   Hyponatremia 12/24/2018   SIADH (syndrome of inappropriate ADH production) ([place]) 12/24/2018   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) 10/27/2018   Generalized weakness 10/14/2018   Hip fracture ([place]) 09/15/2018   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   HLD (hyperlipidemia) 03/24/2018   Contusion of right knee 02/14/2018   Bradycardia 12/28/2017   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   Tremor 09/04/2016   Chronic pain of right knee 08/10/2016   Right ankle pain 08/10/2016   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[place]) 12/11/2015   Urinary retention 09/08/2015   Lymphoma, 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[place]) 04/03/2015   Breathlessness on exertion 11/21/2014   Breath shortness 11/21/2014   Arthropathy 11/07/2014   Atrial flutter, paroxysmal ([place]) 11/07/2014   Type 2 diabetes mellitus ([place]) 11/07/2014   Benign essential tremor 11/07/2014   Benign essential HTN 11/07/2014   Mixed incontinence 11/07/2014   Hypercholesterolemia without hypertriglyceridemia 11/07/2014   Apnea, sleep 11/07/2014   Controlled type 2 diabetes mellitus without complication ([place]) 82/99/3716   Pure hypercholesterolemia 11/07/2014   Other abnormalities of gait and mobility 11/02/2011   Decreased mobility 11/02/2011   Abnormal gait 06/29/2011   Discoordination 06/29/2011   Valentina Gu, PT, DPT #[id number]  Eilleen Kempf, PT 09/01/2021, 4:18 PM  [facility] [address] [place], [place], [address] Phone: [phone]   Fax:  [phone]  Name: Kavonte Bearse MRN: [id number] Date of Birth: 1941-05-05

## 2021-09-08 ENCOUNTER — Ambulatory Visit: Payer: Medicare Other | Admitting: Physical Therapy

## 2021-09-15 ENCOUNTER — Other Ambulatory Visit: Payer: Self-pay

## 2021-09-15 ENCOUNTER — Ambulatory Visit: Payer: Medicare Other | Admitting: Physical Therapy

## 2021-09-15 DIAGNOSIS — R269 Unspecified abnormalities of gait and mobility: Secondary | ICD-10-CM

## 2021-09-15 DIAGNOSIS — R2689 Other abnormalities of gait and mobility: Secondary | ICD-10-CM

## 2021-09-15 DIAGNOSIS — R293 Abnormal posture: Secondary | ICD-10-CM

## 2021-09-15 DIAGNOSIS — M6281 Muscle weakness (generalized): Secondary | ICD-10-CM

## 2021-09-21 NOTE — Therapy (Signed)
[facility] [address] [place], [place], [address] Phone: [phone]   Fax:  [phone]  Physical Therapy Treatment  Patient Details  Name: Mike Holloway MRN: [id number] Date of Birth: [birth year] Referring Provider (PT): Dr. Rudene Christians   Encounter Date: 09/15/2021   PT End of Session - 09/21/21 1837     Visit Number 135    Number of Visits 141    Date for PT Re-Evaluation 10/20/21    Authorization - Visit Number 6    Authorization - Number of Visits 10    PT Start Time 8372    PT Stop Time 9021    PT Time Calculation (min) 59 min    Equipment Utilized During Treatment Gait belt    Activity Tolerance Patient tolerated treatment well;Patient limited by pain;Patient limited by fatigue    Behavior During Therapy [facility] for tasks assessed/performed             Past Medical History:  Diagnosis Date   Arthritis    Atrial flutter ([place])    Diabetes mellitus ([place])    Essential tremor    Essential tremor    deep brain stimulator    Hypercholesteremia    Hypertension    Incontinence    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   SIADH (syndrome of inappropriate ADH production) ([place])    Sleep apnea    Stroke [facility])     Past Surgical History:  Procedure Laterality Date   ABLATION     APPENDECTOMY     CATARACT EXTRACTION, BILATERAL     COLONOSCOPY WITH PROPOFOL N/A 03/17/2018   Procedure: COLONOSCOPY WITH PROPOFOL;  Surgeon: Jonathon Bellows, MD;  Location: [facility] 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   Procedure: FECAL TRANSPLANT;  Surgeon: Jonathon Bellows, MD;  Location: [facility] ENDOSCOPY;  Service: Gastroenterology;  Laterality: N/A;   HEMORRHOID SURGERY     HERNIA REPAIR     HIP FRACTURE SURGERY     INTRAMEDULLARY (IM) NAIL INTERTROCHANTERIC Right 09/16/2018   Procedure: INTRAMEDULLARY (IM) NAIL INTERTROCHANTRIC;  Surgeon: Hessie Knows, MD;  Location: [facility] ORS;  Service: Orthopedics;  Laterality: Right;   IR CATHETER TUBE CHANGE  11/22/2018   NASAL SINUS SURGERY     ORCHIECTOMY     TONSILLECTOMY      There were no vitals filed for this visit.   Subjective Assessment - 09/21/21 1833     Subjective Pts. wife reports she cancelled last appt. due to pt. having bowel issues.  Pt. is doing better this week.    Patient is accompained by: Family member    Pertinent History Pt. states R knee is bothering him but it is the typical discomfort he experiences    Limitations House hold activities;Walking;Standing;Lifting    How long can you sit comfortably? 2 hours    How long can you stand comfortably? <10 min    How long can you walk comfortably? 10 min    Patient Stated Goals Improve LE strength/ gait and balance with daily tasks.  Improve gait/ safety/ progression to least assistive device.    Currently in Pain? Yes    Pain Score 2     Pain Location Knee    Pain Orientation Right    Pain Descriptors / Indicators Constant    Pain Onset More than a month ago    Pain Score 2  Pain Location Back    Pain Onset More than a month ago               Neuro Re-ED:    Hallway walking with RW (4.5 laps) prior to seated rest break.    Forward/lateral/backwards walking in //-bars with UE assist 4x each.   Walking with alt. UE/LE touches in //-bars.     Walking with hurdles step overs (6").  Added Airex pads to challenge    STS from blue mat table with added step/ functional reaching (CGA for safety/ cuing).                              Ther.ex.:   Seated marching/ LAQ 20x.  Standing hip flexion/ heel and toe raises in //-bars 20x (mirror feedback).  Nustep B LE only L5 10 min., post treatment, not billed. MHP provided for lumbar for comfort.           PT Long Term Goals - 08/31/21 1729       PT LONG TERM GOAL #2   Title Pt. will increase Berg balance test to >40 out of 56 to improve independence  with gait/ decrease fall risk.    Baseline Berg: 22/56 (significant fall risk); 9/30 32/56.  11/10: 32 (limited by R knee pain)  2/9: 33/56 limited due to R knee and Low Back pain; 8/10: 29/56; 10/5: 38/56, 11/18/20: 28. 04/28/2021: 28/56 07/07/2021: 21/56.  11/29: 31/56 (limited by knee pain/ use of UE assist).    Time 8    Period Weeks    Status Partially Met    Target Date 10/20/21      PT LONG TERM GOAL #3   Title Pt. able to ambulate 100 feet with consistent 2 point gait pattern and use of least restrictive assistive devce to improve household mobility.    Baseline Pt. able to ambulate with use of RW/ CGA to min. A for safety.   Heavy use of B UE.; 9/30 pt able to ambulate with rollator with inconsistent 2 point gait pattern, heavy reliance on UE CGA/supervision  2/9: Pt. uses 2 point step pattern with walking with rollader with decreased stride length. Pt. would be limited due to pain in the knee walking for >100 ft.; 8/10: Pt demonstrates ability to ambulate 100' with consistent 2 point gait pattern with rollator. 11/18/20: Pt able to amb 130 ft with Rollator and 2-point gait pattern. 04/28/21: Pt able to amb 148 ft with Rollator and 2-point gait pattern 07/07/2021 Pt was able to amb 300 ft with rollator and 2 point gat pattern. Walking rollator is the least restricitive AD as this time. PT required several standing rest breaks.    Time 8    Period Weeks    Status Achieved    Target Date 07/07/21      PT LONG TERM GOAL #4   Title Pt. able to stand from normal chair with no UE assist to improve safety/independence with transfers.     Baseline Unable to stand from standard chair without heavy UE assist. 10/6, pt unable to stand from standard chair without UE support.  11/10: benefits from 1 UE assist    2/9: Pt. can rise from normal chair with 1 UE assist and must control descent with UE assist.; 8/10: Pt requires 2 UE for standing from standard chair height.; 10/5: Requires single UE to stand.  11/18/20: Pt requires B UE support.  04/28/21:  Pt requires single UE support 07/07/2021: Pt continues to require R UE, unable to complete without UE when attempted.    Time 8    Period Weeks    Status Partially Met    Target Date 10/20/21      PT LONG TERM GOAL #5   Title .      PT LONG TERM GOAL #8   Title Pt will demonstrate ability to stand from a lowered surface below 18" with single Ue support to demonstrate improved BLE strength to improve transfers from toilet.    Baseline 10/5: Able to stand from 18" chair with single UE support with use of momentum. 11/18/20: able to after 3 attempts. 10/11: Able to complete after 2 attempts with single UE assist.    Time 8    Period Weeks    Status Partially Met    Target Date 10/20/21                   Plan - 09/21/21 1838     Clinical Impression Statement Pt. requires heavy UE assist with all standing/ dynamic tasks secondary to R knee/ back pain.  PT tx. focusing on decreasing UE assist and increasing LE strength/ muscle endurance to improve functional mobility/ safety with walking.  Pt. ambulates with consistent hip flexion/ step pattern over hurdles and challenged with addition of Airex pads.  Pt. completes 4.5 laps in hallway with use of RW prior to requiring seated rest break.  R knee/ back pain resolves quickly during seated rest break.  No LOB during STS from blue mat with step/ functional reach.  Pt. will continue to benefit from skilled PT services to increase LE strength/ dynamic balance to promote safety/ decrease fall risk with daily mobility.    Personal Factors and Comorbidities Age    Examination-Activity Limitations Toileting;Stand;Squat;Lift;Stairs    Stability/Clinical Decision Making Evolving/Moderate complexity    Clinical Decision Making Moderate    Rehab Potential Fair    PT Frequency 1x / week    PT Duration 8 weeks    PT Treatment/Interventions Moist Heat;Functional mobility training;Therapeutic  activities;Therapeutic exercise;Balance training;Patient/family education;Manual techniques;Passive range of motion;Neuromuscular re-education;Stair training;Gait training;ADLs/Self Care Home Management    PT Next Visit Plan Progress LE strengthening and balance to reduce fall risk.    PT Home Exercise Plan [email or web address]    Consulted and Agree with Plan of Care Patient;Family member/caregiver    Family Member Consulted spouse: [name]             Patient will benefit from skilled therapeutic intervention in order to improve the following deficits and impairments:  Abnormal gait, Decreased endurance, Decreased activity tolerance, Pain, Decreased balance, Impaired flexibility, Decreased strength, Postural dysfunction  Visit Diagnosis: Muscle weakness (generalized)  Gait difficulty  Balance problem  Abnormal posture     Problem List Patient Active Problem List   Diagnosis Date Noted   DVT femoral (deep venous thrombosis) with thrombophlebitis, left (HCC) 02/06/2020   Lymphedema 04/15/2019   Bilateral leg edema 01/25/2019   Hyponatremia 12/24/2018   SIADH (syndrome of inappropriate ADH production) ([place]) 12/24/2018   Erosion of urethra due to catheterization of urinary tract ([place]) 10/27/2018   Generalized weakness 10/14/2018   Hip fracture ([place]) 09/15/2018   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   HLD (hyperlipidemia) 03/24/2018   Contusion of right knee 02/14/2018   Bradycardia 12/28/2017   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   Tremor  09/04/2016   Chronic pain of right knee 08/10/2016   Right ankle pain 08/10/2016   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[place]) 12/11/2015   Urinary retention 09/08/2015   Lymphoma, 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[place]) 04/03/2015   Breathlessness on exertion 11/21/2014   Breath shortness 11/21/2014   Arthropathy  11/07/2014   Atrial flutter, paroxysmal ([place]) 11/07/2014   Type 2 diabetes mellitus ([place]) 11/07/2014   Benign essential tremor 11/07/2014   Benign essential HTN 11/07/2014   Mixed incontinence 11/07/2014   Hypercholesterolemia without hypertriglyceridemia 11/07/2014   Apnea, sleep 11/07/2014   Controlled type 2 diabetes mellitus without complication ([employer]) 33/54/5625   Pure hypercholesterolemia 11/07/2014   Other abnormalities of gait and mobility 11/02/2011   Decreased mobility 11/02/2011   Abnormal gait 06/29/2011   Discoordination 06/29/2011   Pura Spice, PT, DPT # [id number] 09/21/2021, 6:43 PM  [facility] [address]. [place], [place], [address] Phone: [phone]   Fax:  [phone]  Name: Brendyn Mclaren MRN: [id number] Date of Birth: May 28, 1941

## 2021-09-22 ENCOUNTER — Encounter: Payer: Self-pay | Admitting: Physical Therapy

## 2021-09-22 ENCOUNTER — Ambulatory Visit: Payer: Medicare Other | Admitting: Physical Therapy

## 2021-09-22 ENCOUNTER — Other Ambulatory Visit: Payer: Self-pay

## 2021-09-22 DIAGNOSIS — M6281 Muscle weakness (generalized): Secondary | ICD-10-CM

## 2021-09-22 DIAGNOSIS — R269 Unspecified abnormalities of gait and mobility: Secondary | ICD-10-CM

## 2021-09-22 DIAGNOSIS — R293 Abnormal posture: Secondary | ICD-10-CM

## 2021-09-22 DIAGNOSIS — R2689 Other abnormalities of gait and mobility: Secondary | ICD-10-CM

## 2021-09-22 NOTE — Therapy (Signed)
[facility] [address] [place], [place], [address] Phone: [phone]   Fax:  [phone]  Physical Therapy Treatment  Patient Details  Name: Mike Holloway MRN: [id number] Date of Birth: [birth year] Referring Provider (PT): Dr. Rudene Christians   Encounter Date: 09/22/2021   PT End of Session - 09/22/21 1443     Visit Number 136    Number of Visits 141    Date for PT Re-Evaluation 10/20/21    Authorization - Visit Number 7    Authorization - Number of Visits 10    PT Start Time 1301    PT Stop Time 1407    PT Time Calculation (min) 66 min    Equipment Utilized During Treatment Gait belt    Activity Tolerance Patient tolerated treatment well;Patient limited by pain;Patient limited by fatigue    Behavior During Therapy [facility] for tasks assessed/performed             Past Medical History:  Diagnosis Date   Arthritis    Atrial flutter ([place])    Diabetes mellitus ([place])    Essential tremor    Essential tremor    deep brain stimulator    Hypercholesteremia    Hypertension    Incontinence    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   SIADH (syndrome of inappropriate ADH production) ([place])    Sleep apnea    Stroke [facility])     Past Surgical History:  Procedure Laterality Date   ABLATION     APPENDECTOMY     CATARACT EXTRACTION, BILATERAL     COLONOSCOPY WITH PROPOFOL N/A 03/17/2018   Procedure: COLONOSCOPY WITH PROPOFOL;  Surgeon: Jonathon Bellows, MD;  Location: [facility] 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   Subjective Assessment - 09/22/21 1438     Subjective Pt. had a nice Christmas and is doing well.  No new complaints.    Patient is accompained by: Family member    Pertinent History Pt. states R knee is bothering him but it is the typical discomfort he experiences    Limitations House hold activities;Walking;Standing;Lifting    How long can you sit comfortably? 2 hours    How long can you stand comfortably? <10 min    How long can you walk comfortably? 10 min    Patient Stated Goals Improve LE strength/ gait and balance with daily tasks.  Improve gait/ safety/ progression to least assistive device.    Currently in Pain? Yes    Pain Score 2     Pain Location Knee    Pain Orientation Right    Pain Onset More than a month ago    Pain Onset More than a month ago            Ther.ex.:  Nustep B LE only L5 10 min., pre-treatment, not billed. MHP provided to lumbar for comfort.    Standing wt. Shifting with no UE assist/ standing marching/ step ups at stairs 4x.   Neuro Re-ED:    Forward/lateral/backwards walking in //-bars with UE assist 4x each.   Walking in //-bars with 6" step ups/ overs and heavy B UE support 3x.  Pt. Able to step up with R LE and no increase c/o R knee pain reported.  Good safety awareness.    Walking in hallway/ outside down ramp with use of RW and consistent step pattern/ cadence.  Pt. Cued to increase cadence but limited at this time.                                   PT Long Term Goals - 08/31/21 1729       PT LONG TERM GOAL #2   Title Pt. will increase Berg balance test to >40 out of 56 to improve independence with gait/ decrease fall risk.    Baseline Berg: 22/56 (significant fall risk); 9/30 32/56.  11/10: 32 (limited by R  knee pain)  2/9: 33/56 limited due to R knee and Low Back pain; 8/10: 29/56; 10/5: 38/56, 11/18/20: 28. 04/28/2021: 28/56 07/07/2021: 21/56.  11/29: 31/56 (limited by knee pain/ use of UE assist).    Time 8    Period Weeks    Status Partially Met    Target Date 10/20/21      PT LONG TERM GOAL #3   Title Pt. able to ambulate 100 feet with consistent 2 point gait pattern and use of least restrictive assistive devce to improve household mobility.    Baseline Pt. able to ambulate with use of RW/ CGA to min. A for safety.   Heavy use of B UE.; 9/30 pt able to ambulate with rollator with inconsistent 2 point gait pattern, heavy reliance on UE CGA/supervision  2/9: Pt. uses 2 point step pattern with walking with rollader with decreased stride length. Pt. would be limited due to pain in the knee walking for >100 ft.; 8/10: Pt demonstrates ability to ambulate 100' with consistent 2 point gait pattern with rollator. 11/18/20: Pt able to amb 130 ft with Rollator and 2-point gait pattern. 04/28/21: Pt able to amb 148 ft with Rollator and 2-point gait pattern 07/07/2021 Pt was able to amb 300 ft with rollator and 2 point gat pattern. Walking rollator is the least restricitive AD as this time. PT required several standing rest breaks.    Time 8    Period Weeks    Status Achieved    Target Date 07/07/21      PT LONG TERM GOAL #4   Title Pt. able to stand from normal chair with no UE assist to improve safety/independence with transfers.     Baseline Unable to stand from standard chair without heavy UE assist. 10/6, pt unable to stand from standard chair without UE support.  11/10: benefits from 1 UE assist    2/9: Pt. can rise from normal chair with 1 UE assist and must control descent with UE assist.; 8/10: Pt requires 2 UE for standing from standard chair height.; 10/5: Requires single UE to stand. 11/18/20: Pt requires B UE support.  04/28/21: Pt requires single UE support 07/07/2021: Pt continues to require R UE, unable  to complete without UE when attempted.    Time 8  Period Weeks    Status Partially Met    Target Date 10/20/21      PT LONG TERM GOAL #5   Title .      PT LONG TERM GOAL #8   Title Pt will demonstrate ability to stand from a lowered surface below 18" with single Ue support to demonstrate improved BLE strength to improve transfers from toilet.    Baseline 10/5: Able to stand from 18" chair with single UE support with use of momentum. 11/18/20: able to after 3 attempts. 10/11: Able to complete after 2 attempts with single UE assist.    Time 8    Period Weeks    Status Partially Met    Target Date 10/20/21                   Plan - 09/22/21 1444     Clinical Impression Statement Pt. completes 6 inch step ups/ overs in //-bars with use of B UE assist due to R knee/ fear of falling.  Pt. completes all standing ther.ex. wtih proper technique and minimal cuing to correct head position/ upright posture.  Pt. ambulates in clinic/ outside with consistent step pattern except when descending ramp at side of building to car.  Increase low back discomfort with static standing in //-bars without UE assist for >3 min.  Pt. encouarged to walk around house several times/ day and complete HEP.    Personal Factors and Comorbidities Age;Comorbidity 3+    Examination-Activity Limitations Toileting;Stand;Squat;Lift;Stairs    Stability/Clinical Decision Making Evolving/Moderate complexity    Clinical Decision Making Moderate    Rehab Potential Fair    PT Frequency 1x / week    PT Duration 8 weeks    PT Treatment/Interventions Moist Heat;Functional mobility training;Therapeutic activities;Therapeutic exercise;Balance training;Patient/family education;Manual techniques;Passive range of motion;Neuromuscular re-education;Stair training;Gait training;ADLs/Self Care Home Management    PT Next Visit Plan Progress LE strengthening and balance to reduce fall risk.   ISSUE NEW SCHEDULE    PT Home Exercise Plan  [email or web address]    Consulted and Agree with Plan of Care Patient;Family member/caregiver    Family Member Consulted spouse: [name]             Patient will benefit from skilled therapeutic intervention in order to improve the following deficits and impairments:  Abnormal gait, Decreased endurance, Decreased activity tolerance, Pain, Decreased balance, Impaired flexibility, Decreased strength, Postural dysfunction  Visit Diagnosis: Muscle weakness (generalized)  Gait difficulty  Balance problem  Abnormal posture     Problem List Patient Active Problem List   Diagnosis Date Noted   DVT femoral (deep venous thrombosis) with thrombophlebitis, left (HCC) 02/06/2020   Lymphedema 04/15/2019   Bilateral leg edema 01/25/2019   Hyponatremia 12/24/2018   SIADH (syndrome of inappropriate ADH production) ([place]) 12/24/2018   Erosion of urethra due to catheterization of urinary tract ([place]) 10/27/2018   Generalized weakness 10/14/2018   Hip fracture ([place]) 09/15/2018   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   HLD (hyperlipidemia) 03/24/2018   Contusion of right knee 02/14/2018   Bradycardia 12/28/2017   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   Tremor 09/04/2016   Chronic pain of right knee 08/10/2016   Right ankle pain 08/10/2016   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[place]) 12/11/2015   Urinary retention 09/08/2015   Lymphoma, 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[place]) 04/03/2015   Breathlessness on exertion 11/21/2014   Breath shortness 11/21/2014   Arthropathy  11/07/2014   Atrial flutter, paroxysmal ([place]) 11/07/2014   Type 2 diabetes mellitus ([place]) 11/07/2014   Benign essential tremor 11/07/2014   Benign essential HTN 11/07/2014   Mixed incontinence 11/07/2014   Hypercholesterolemia without hypertriglyceridemia 11/07/2014   Apnea, sleep 11/07/2014   Controlled type  2 diabetes mellitus without complication ([place]) 87/02/5825   Pure hypercholesterolemia 11/07/2014   Other abnormalities of gait and mobility 11/02/2011   Decreased mobility 11/02/2011   Abnormal gait 06/29/2011   Discoordination 06/29/2011   Pura Spice, PT, DPT # [phone] 09/22/2021, 2:55 PM  [facility] [address] [place], [place], [address] Phone: [phone]   Fax:  [phone]  Name: Keiji Melland MRN: [id number] Date of Birth: 1940/12/12

## 2021-09-23 ENCOUNTER — Ambulatory Visit (INDEPENDENT_AMBULATORY_CARE_PROVIDER_SITE_OTHER): Payer: Medicare Other | Admitting: Physician Assistant

## 2021-09-23 DIAGNOSIS — R339 Retention of urine, unspecified: Secondary | ICD-10-CM | POA: Diagnosis not present

## 2021-09-23 NOTE — Progress Notes (Signed)
Cath Change/ Replacement  Patient is present today for a catheter change due to urinary retention.  45ml of water was removed from the balloon, a 16FR coude foley cath was removed without difficulty.  Patient was cleaned and prepped in a sterile fashion with betadine and 2% lidocaine jelly was instilled into the urethra. A 16 FR coude foley cath was replaced into the bladder no complications were noted Urine return was noted 63ml and urine was yellow in color. The balloon was filled with 78ml of sterile water. A leg bag was attached for drainage.  Patient tolerated well.    Performed by: Debroah Loop, PA-C   Follow up: Return in about 4 weeks (around 10/21/2021) for Catheter exchange.

## 2021-09-29 ENCOUNTER — Other Ambulatory Visit: Payer: Self-pay

## 2021-09-29 ENCOUNTER — Ambulatory Visit: Payer: Medicare Other | Attending: Orthopedic Surgery | Admitting: Physical Therapy

## 2021-09-29 ENCOUNTER — Encounter: Payer: Self-pay | Admitting: Physical Therapy

## 2021-09-29 DIAGNOSIS — R293 Abnormal posture: Secondary | ICD-10-CM | POA: Diagnosis present

## 2021-09-29 DIAGNOSIS — R269 Unspecified abnormalities of gait and mobility: Secondary | ICD-10-CM | POA: Diagnosis present

## 2021-09-29 DIAGNOSIS — M6281 Muscle weakness (generalized): Secondary | ICD-10-CM | POA: Diagnosis not present

## 2021-09-29 DIAGNOSIS — R2689 Other abnormalities of gait and mobility: Secondary | ICD-10-CM | POA: Insufficient documentation

## 2021-09-29 NOTE — Therapy (Signed)
[facility] [facility] [address] [place], [place], [address] Phone: [phone]   Fax:  [phone]  Physical Therapy Treatment  Patient Details  Name: Mike Holloway MRN: [id number] Date of Birth: 08-13-1941 Referring Provider (PT): Dr. Rudene Christians   Encounter Date: 09/29/2021   PT End of Session - 09/29/21 1307     Visit Number 137    Number of Visits 141    Date for PT Re-Evaluation 10/20/21    Authorization - Visit Number 8    Authorization - Number of Visits 10    PT Start Time 1300    PT Stop Time 8325    PT Time Calculation (min) 63 min    Activity Tolerance Patient tolerated treatment well;Patient limited by pain;Patient limited by fatigue    Behavior During Therapy [facility] for tasks assessed/performed             Past Medical History:  Diagnosis Date   Arthritis    Atrial flutter ([place])    Diabetes mellitus ([place])    Essential tremor    Essential tremor    deep brain stimulator    Hypercholesteremia    Hypertension    Incontinence    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   SIADH (syndrome of inappropriate ADH production) ([place])    Sleep apnea    Stroke [facility])     Past Surgical History:  Procedure Laterality Date   ABLATION     APPENDECTOMY     CATARACT EXTRACTION, BILATERAL     COLONOSCOPY WITH PROPOFOL N/A 03/17/2018   Procedure: COLONOSCOPY WITH PROPOFOL;  Surgeon: Jonathon Bellows, MD;  Location: [facility] 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   Subjective Assessment - 09/30/21 1332     Subjective Pts. wife states pt. is walking better at [employer]/ home.  No new complaints.    Patient is accompained by: Family member    Pertinent History Pt. states R knee is bothering him but it is the typical discomfort he experiences    Limitations House hold activities;Walking;Standing;Lifting    How long can you sit comfortably? 2 hours    How long can you stand comfortably? <10 min    How long can you walk comfortably? 10 min    Patient Stated Goals Improve LE strength/ gait and balance with daily tasks.  Improve gait/ safety/ progression to least assistive device.    Currently in Pain? Yes    Pain Score 2     Pain Location Knee    Pain Orientation Right    Pain Descriptors / Indicators Constant    Pain Onset More than a month ago    Pain Onset More than a month ago  Neuro Re-ED:   STS from blue mat table/ added Airex pad until feet (no UE assist)- 4x (SBA/CGA for safety)- cuing to increase posture and wt. Shifting.     Forward/lateral/backwards walking in //-bars with UE assist 4x each.  Pharmacist, hospital.     Walking in //-bars with 6" step ups/ overs and heavy B UE support 3x.  Pt. Able to step up with R LE and no increase c/o R knee pain reported.  Good safety awareness.    Walking in hallway/ outside down ramp with use of RW and consistent step pattern/ cadence.  Pt. Cued to increase cadence but limited at this time.          Ther.ex.:    Standing hip flexion/ abduction/ knee flexion/ toe and heel shift 20x.  Discussed HEP.   Nustep B LE only L5 10 min., MHP provided to lumbar for comfort.         PT Long Term Goals - 08/31/21 1729       PT LONG TERM GOAL #2   Title Pt. will increase Berg balance test to >40 out of 56 to improve  independence with gait/ decrease fall risk.    Baseline Berg: 22/56 (significant fall risk); 9/30 32/56.  11/10: 32 (limited by R knee pain)  2/9: 33/56 limited due to R knee and Low Back pain; 8/10: 29/56; 10/5: 38/56, 11/18/20: 28. 04/28/2021: 28/56 07/07/2021: 21/56.  11/29: 31/56 (limited by knee pain/ use of UE assist).    Time 8    Period Weeks    Status Partially Met    Target Date 10/20/21      PT LONG TERM GOAL #3   Title Pt. able to ambulate 100 feet with consistent 2 point gait pattern and use of least restrictive assistive devce to improve household mobility.    Baseline Pt. able to ambulate with use of RW/ CGA to min. A for safety.   Heavy use of B UE.; 9/30 pt able to ambulate with rollator with inconsistent 2 point gait pattern, heavy reliance on UE CGA/supervision  2/9: Pt. uses 2 point step pattern with walking with rollader with decreased stride length. Pt. would be limited due to pain in the knee walking for >100 ft.; 8/10: Pt demonstrates ability to ambulate 100' with consistent 2 point gait pattern with rollator. 11/18/20: Pt able to amb 130 ft with Rollator and 2-point gait pattern. 04/28/21: Pt able to amb 148 ft with Rollator and 2-point gait pattern 07/07/2021 Pt was able to amb 300 ft with rollator and 2 point gat pattern. Walking rollator is the least restricitive AD as this time. PT required several standing rest breaks.    Time 8    Period Weeks    Status Achieved    Target Date 07/07/21      PT LONG TERM GOAL #4   Title Pt. able to stand from normal chair with no UE assist to improve safety/independence with transfers.     Baseline Unable to stand from standard chair without heavy UE assist. 10/6, pt unable to stand from standard chair without UE support.  11/10: benefits from 1 UE assist    2/9: Pt. can rise from normal chair with 1 UE assist and must control descent with UE assist.; 8/10: Pt requires 2 UE for standing from standard chair height.; 10/5: Requires single UE to  stand. 11/18/20: Pt requires B UE support.  04/28/21: Pt requires single UE support 07/07/2021: Pt continues to require R UE,  unable to complete without UE when attempted.    Time 8    Period Weeks    Status Partially Met    Target Date 10/20/21      PT LONG TERM GOAL #5   Title .      PT LONG TERM GOAL #8   Title Pt will demonstrate ability to stand from a lowered surface below 18" with single Ue support to demonstrate improved BLE strength to improve transfers from toilet.    Baseline 10/5: Able to stand from 18" chair with single UE support with use of momentum. 11/18/20: able to after 3 attempts. 10/11: Able to complete after 2 attempts with single UE assist.    Time 8    Period Weeks    Status Partially Met    Target Date 10/20/21                Plan - 09/30/21 1347     Clinical Impression Statement Focus on R hip flexion/ quad strengthening with step ups and standing ther.ex.  Pt. focused on R knee pain with prolonged standing/ step ups.  Heavy UE assist with all walking/ wt. bearing activities in gym and //-bars.  Pt. requires extra time/ focus when ambulating down ramp to car with RW and SBA/CGA for safety.  Slight increase in low back during STS/ step ups, even with heavy UE assist.  Pts. low back symptoms resolve quickly with a seated rest break.  Pt. works hard during tx. and showing good progress with LE endurance during Nustep.    Personal Factors and Comorbidities Age;Comorbidity 3+    Examination-Activity Limitations Toileting;Stand;Squat;Lift;Stairs    Stability/Clinical Decision Making Evolving/Moderate complexity    Clinical Decision Making Moderate    Rehab Potential Fair    PT Frequency 1x / week    PT Duration 8 weeks    PT Treatment/Interventions Moist Heat;Functional mobility training;Therapeutic activities;Therapeutic exercise;Balance training;Patient/family education;Manual techniques;Passive range of motion;Neuromuscular re-education;Stair training;Gait  training;ADLs/Self Care Home Management    PT Next Visit Plan Progress LE strengthening and balance to reduce fall risk.    PT Home Exercise Plan [email or web address]    Consulted and Agree with Plan of Care Patient;Family member/caregiver    Family Member Consulted spouse: [name]             Patient will benefit from skilled therapeutic intervention in order to improve the following deficits and impairments:  Abnormal gait, Decreased endurance, Decreased activity tolerance, Pain, Decreased balance, Impaired flexibility, Decreased strength, Postural dysfunction  Visit Diagnosis: Muscle weakness (generalized)  Gait difficulty  Balance problem  Abnormal posture     Problem List Patient Active Problem List   Diagnosis Date Noted   DVT femoral (deep venous thrombosis) with thrombophlebitis, left (HCC) 02/06/2020   Lymphedema 04/15/2019   Bilateral leg edema 01/25/2019   Hyponatremia 12/24/2018   SIADH (syndrome of inappropriate ADH production) ([place]) 12/24/2018   Erosion of urethra due to catheterization of urinary tract ([place]) 10/27/2018   Generalized weakness 10/14/2018   Hip fracture ([place]) 09/15/2018   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   HLD (hyperlipidemia) 03/24/2018   Contusion of right knee 02/14/2018   Bradycardia 12/28/2017   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   Tremor 09/04/2016   Chronic pain of right knee 08/10/2016   Right ankle pain 08/10/2016   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[place]) 12/11/2015   Urinary retention 09/08/2015   Lymphoma,  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[place]) 04/03/2015   Breathlessness on exertion 11/21/2014   Breath shortness 11/21/2014   Arthropathy 11/07/2014   Atrial flutter, paroxysmal ([place]) 11/07/2014   Type 2 diabetes mellitus ([place]) 11/07/2014   Benign essential tremor 11/07/2014   Benign essential HTN  11/07/2014   Mixed incontinence 11/07/2014   Hypercholesterolemia without hypertriglyceridemia 11/07/2014   Apnea, sleep 11/07/2014   Controlled type 2 diabetes mellitus without complication ([place]) 67/09/1001   Pure hypercholesterolemia 11/07/2014   Other abnormalities of gait and mobility 11/02/2011   Decreased mobility 11/02/2011   Abnormal gait 06/29/2011   Discoordination 06/29/2011   Pura Spice, PT, DPT # [phone] 09/30/2021, 2:14 PM  [place] [facility] [address] [place], [place], [address] Phone: [phone]   Fax:  [phone]  Name: Mike Holloway MRN: [id number] Date of Birth: [birth year]

## 2021-10-06 ENCOUNTER — Encounter: Payer: Self-pay | Admitting: Physical Therapy

## 2021-10-06 ENCOUNTER — Other Ambulatory Visit: Payer: Self-pay

## 2021-10-06 ENCOUNTER — Ambulatory Visit: Payer: Medicare Other | Admitting: Physical Therapy

## 2021-10-06 DIAGNOSIS — R269 Unspecified abnormalities of gait and mobility: Secondary | ICD-10-CM

## 2021-10-06 DIAGNOSIS — M6281 Muscle weakness (generalized): Secondary | ICD-10-CM

## 2021-10-06 DIAGNOSIS — R2689 Other abnormalities of gait and mobility: Secondary | ICD-10-CM

## 2021-10-06 DIAGNOSIS — R293 Abnormal posture: Secondary | ICD-10-CM

## 2021-10-06 NOTE — Therapy (Signed)
[facility] [address] [place], [place], [address] Phone: [phone]   Fax:  [phone]  Physical Therapy Treatment  Patient Details  Name: Mike Holloway MRN: [id number] Date of Birth: July 08, 1941 Referring Provider (PT): Dr. Rudene Christians   Encounter Date: 10/06/2021   PT End of Session - 10/06/21 1311     Visit Number 138    Number of Visits 141    Date for PT Re-Evaluation 10/20/21    Authorization - Visit Number 9    Authorization - Number of Visits 10    PT Start Time 1301    PT Stop Time 1402    PT Time Calculation (min) 61 min    Activity Tolerance Patient tolerated treatment well;Patient limited by pain;Patient limited by fatigue    Behavior During Therapy [facility] for tasks assessed/performed             Past Medical History:  Diagnosis Date   Arthritis    Atrial flutter ([place])    Diabetes mellitus ([place])    Essential tremor    Essential tremor    deep brain stimulator    Hypercholesteremia    Hypertension    Incontinence    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   SIADH (syndrome of inappropriate ADH production) ([place])    Sleep apnea    Stroke [facility])     Past Surgical History:  Procedure Laterality Date   ABLATION     APPENDECTOMY     CATARACT EXTRACTION, BILATERAL     COLONOSCOPY WITH PROPOFOL N/A 03/17/2018   Procedure: COLONOSCOPY WITH PROPOFOL;  Surgeon: Jonathon Bellows, MD;  Location: [facility] 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   Subjective Assessment - 10/06/21 1302     Subjective Pt. reports no low back pain and states knee is uncomfortable.  Pt. reports a LOB this weekend but no falls.  Pt. able to self-correct independently.    Patient is accompained by: Family member    Pertinent History Pt. states R knee is bothering him but it is the typical discomfort he experiences    Limitations House hold activities;Walking;Standing;Lifting    How long can you sit comfortably? 2 hours    How long can you stand comfortably? <10 min    How long can you walk comfortably? 10 min    Patient Stated Goals Improve LE strength/ gait and balance with daily tasks.  Improve gait/ safety/ progression to least assistive device.    Currently in Pain? Yes    Pain Score 2     Pain Location Knee    Pain Orientation Right    Pain Onset More than a month ago    Pain Onset More than a month ago  Ther.ex.:     4# ankle wts.: forward/ backwards/ lateral in //-bars 3 laps each.    Seated LAQ 10x with 4# ankle wts./ 10x with no ankle wts.     Nustep L5 B LE only 10 min., MHP provided to lumbar for comfort.     Neuro Re-ED:    Forward/lateral/backwards walking in //-bars with UE assist 4x each.  Pharmacist, hospital.    Walking with 6"/12" hurdles in //-bars working on increasing hip flexion.  Difficulty with 12" hurdles 2nd set.  Standing with fishing (2-3 minutes x 2).     Walking in //-bars with 6" step ups/ overs and heavy B UE support 3x.  Pt. Able to step up with R LE and no increase c/o R knee pain reported.  Good safety awareness.    Walking in hallway/ outside down ramp with use of RW and consistent step pattern/ cadence. Pt. Cued to increase cadence but limited at this time.                    PT Long Term Goals - 08/31/21 1729        PT LONG TERM GOAL #2   Title Pt. will increase Berg balance test to >40 out of 56 to improve independence with gait/ decrease fall risk.    Baseline Berg: 22/56 (significant fall risk); 9/30 32/56.  11/10: 32 (limited by R knee pain)  2/9: 33/56 limited due to R knee and Low Back pain; 8/10: 29/56; 10/5: 38/56, 11/18/20: 28. 04/28/2021: 28/56 07/07/2021: 21/56.  11/29: 31/56 (limited by knee pain/ use of UE assist).    Time 8    Period Weeks    Status Partially Met    Target Date 10/20/21      PT LONG TERM GOAL #3   Title Pt. able to ambulate 100 feet with consistent 2 point gait pattern and use of least restrictive assistive devce to improve household mobility.    Baseline Pt. able to ambulate with use of RW/ CGA to min. A for safety.   Heavy use of B UE.; 9/30 pt able to ambulate with rollator with inconsistent 2 point gait pattern, heavy reliance on UE CGA/supervision  2/9: Pt. uses 2 point step pattern with walking with rollader with decreased stride length. Pt. would be limited due to pain in the knee walking for >100 ft.; 8/10: Pt demonstrates ability to ambulate 100' with consistent 2 point gait pattern with rollator. 11/18/20: Pt able to amb 130 ft with Rollator and 2-point gait pattern. 04/28/21: Pt able to amb 148 ft with Rollator and 2-point gait pattern 07/07/2021 Pt was able to amb 300 ft with rollator and 2 point gat pattern. Walking rollator is the least restricitive AD as this time. PT required several standing rest breaks.    Time 8    Period Weeks    Status Achieved    Target Date 07/07/21      PT LONG TERM GOAL #4   Title Pt. able to stand from normal chair with no UE assist to improve safety/independence with transfers.     Baseline Unable to stand from standard chair without heavy UE assist. 10/6, pt unable to stand from standard chair without UE support.  11/10: benefits from 1 UE assist    2/9: Pt. can rise from normal chair with 1 UE assist and must control descent with UE  assist.; 8/10: Pt requires 2 UE for standing from standard chair height.; 10/5: Requires single UE  to stand. 11/18/20: Pt requires B UE support.  04/28/21: Pt requires single UE support 07/07/2021: Pt continues to require R UE, unable to complete without UE when attempted.    Time 8    Period Weeks    Status Partially Met    Target Date 10/20/21      PT LONG TERM GOAL #5   Title .      PT LONG TERM GOAL #8   Title Pt will demonstrate ability to stand from a lowered surface below 18" with single Ue support to demonstrate improved BLE strength to improve transfers from toilet.    Baseline 10/5: Able to stand from 18" chair with single UE support with use of momentum. 11/18/20: able to after 3 attempts. 10/11: Able to complete after 2 attempts with single UE assist.    Time 8    Period Weeks    Status Partially Met    Target Date 10/20/21                   Plan - 10/06/21 1436     Clinical Impression Statement Pt had some right hip pain during LAQ with ankle wts.  Pain resolved after removing wts. and changing position.  Pt. has some weakness and pain with excessive hip flexion during step overs in //-bars.  Pt will continue to benefit from PT to progress WB activites as tolerated.  Pt has some increase in LBP during prolonged standing activities. During ambulation down ramp pt requires extra time to focus and watch his steps when walking to the car.  Pt felt some relief while biking with a hot pack placed on LB.    Personal Factors and Comorbidities Age;Comorbidity 3+    Examination-Activity Limitations Toileting;Stand;Squat;Lift;Stairs    Stability/Clinical Decision Making Evolving/Moderate complexity    Clinical Decision Making Moderate    Rehab Potential Fair    PT Frequency 1x / week    PT Duration 8 weeks    PT Treatment/Interventions Moist Heat;Functional mobility training;Therapeutic activities;Therapeutic exercise;Balance training;Patient/family education;Manual  techniques;Passive range of motion;Neuromuscular re-education;Stair training;Gait training;ADLs/Self Care Home Management    PT Next Visit Plan Progress LE strengthening and balance to reduce fall risk.    PT Home Exercise Plan [email or web address]    Consulted and Agree with Plan of Care Patient;Family member/caregiver    Family Member Consulted spouse: [name]             Patient will benefit from skilled therapeutic intervention in order to improve the following deficits and impairments:  Abnormal gait, Decreased endurance, Decreased activity tolerance, Pain, Decreased balance, Impaired flexibility, Decreased strength, Postural dysfunction  Visit Diagnosis: Muscle weakness (generalized)  Gait difficulty  Balance problem  Abnormal posture     Problem List Patient Active Problem List   Diagnosis Date Noted   DVT femoral (deep venous thrombosis) with thrombophlebitis, left (HCC) 02/06/2020   Lymphedema 04/15/2019   Bilateral leg edema 01/25/2019   Hyponatremia 12/24/2018   SIADH (syndrome of inappropriate ADH production) ([place]) 12/24/2018   Erosion of urethra due to catheterization of urinary tract ([place]) 10/27/2018   Generalized weakness 10/14/2018   Hip fracture ([place]) 09/15/2018   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   HLD (hyperlipidemia) 03/24/2018   Contusion of right knee 02/14/2018   Bradycardia 12/28/2017   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   Tremor 09/04/2016   Chronic pain of right knee 08/10/2016   Right ankle pain 08/10/2016  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[place]) 12/11/2015   Urinary retention 09/08/2015   Lymphoma, 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[place]) 04/03/2015   Breathlessness on exertion 11/21/2014   Breath shortness 11/21/2014   Arthropathy 11/07/2014   Atrial flutter, paroxysmal ([place]) 11/07/2014   Type 2 diabetes mellitus  ([place]) 11/07/2014   Benign essential tremor 11/07/2014   Benign essential HTN 11/07/2014   Mixed incontinence 11/07/2014   Hypercholesterolemia without hypertriglyceridemia 11/07/2014   Apnea, sleep 11/07/2014   Controlled type 2 diabetes mellitus without complication ([place]) 41/58/3094   Pure hypercholesterolemia 11/07/2014   Other abnormalities of gait and mobility 11/02/2011   Decreased mobility 11/02/2011   Abnormal gait 06/29/2011   Discoordination 06/29/2011   Pura Spice, PT, DPT # [id number] 10/06/2021, 3:42 PM  [facility] [address] [place], [place], [address] Phone: [phone]   Fax:  [phone]  Name: Delta Deshmukh MRN: [id number] Date of Birth: 26-Apr-1941

## 2021-10-13 ENCOUNTER — Ambulatory Visit: Payer: Medicare Other | Admitting: Physical Therapy

## 2021-10-20 ENCOUNTER — Ambulatory Visit: Payer: Medicare Other | Admitting: Physical Therapy

## 2021-10-20 ENCOUNTER — Encounter: Payer: Self-pay | Admitting: Physical Therapy

## 2021-10-20 ENCOUNTER — Other Ambulatory Visit: Payer: Self-pay

## 2021-10-20 DIAGNOSIS — M6281 Muscle weakness (generalized): Secondary | ICD-10-CM | POA: Diagnosis not present

## 2021-10-20 DIAGNOSIS — R269 Unspecified abnormalities of gait and mobility: Secondary | ICD-10-CM

## 2021-10-20 DIAGNOSIS — R2689 Other abnormalities of gait and mobility: Secondary | ICD-10-CM

## 2021-10-20 DIAGNOSIS — R293 Abnormal posture: Secondary | ICD-10-CM

## 2021-10-20 NOTE — Progress Notes (Signed)
Cath Change/ Replacement  Patient is present today for a catheter change due to urinary retention.  9 ml of water was removed from the balloon, a 16 FR foley cath was removed with out difficulty.  Patient was cleaned and prepped in a sterile fashion with betadine. A 16 FR foley cath was replaced into the bladder no complications were noted Urine return was noted 20 ml and urine was yellow in color. The balloon was filled with 46ml of sterile water. A leg bag was attached for drainage.  A night bag was also given to the patient and patient was given instruction on how to change from one bag to another. Patient was given proper instruction on catheter care.    Performed by: Zara Council, PA-C and Dolores Lory, CMA  Follow up: One month for Foley exchange

## 2021-10-20 NOTE — Therapy (Addendum)
[facility] [address] [place], [place], [address] Phone: [phone]   Fax:  [phone]  Physical Therapy Treatment Physical Therapy Progress Note   Dates of reporting period  08/04/2021 to 10/20/2021  Patient Details  Name: Mike Holloway MRN: [id number] Date of Birth: 03-02-1941 Referring Provider (PT): Dr. Rudene Christians   Encounter Date: 10/20/2021   PT End of Session - 10/20/21 1340     Visit Number 139    Number of Visits 151    Date for PT Re-Evaluation 01/12/22    Authorization - Visit Number 10    Authorization - Number of Visits 10    PT Start Time 1655    PT Stop Time 3748    PT Time Calculation (min) 61 min    Activity Tolerance Patient tolerated treatment well;Patient limited by pain;Patient limited by fatigue    Behavior During Therapy [facility] for tasks assessed/performed             Past Medical History:  Diagnosis Date   Arthritis    Atrial flutter ([place])    Diabetes mellitus ([place])    Essential tremor    Essential tremor    deep brain stimulator    Hypercholesteremia    Hypertension    Incontinence    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   SIADH (syndrome of inappropriate ADH production) ([place])    Sleep apnea    Stroke [facility])     Past Surgical History:  Procedure Laterality Date   ABLATION     APPENDECTOMY     CATARACT EXTRACTION, BILATERAL     COLONOSCOPY WITH PROPOFOL N/A 03/17/2018   Procedure: COLONOSCOPY WITH PROPOFOL;  Surgeon: Jonathon Bellows, MD;  Location: [facility] 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   Subjective Assessment - 10/20/21 1341     Subjective Pt. reports no low back pain and minor pain in R knee. Pt. states he is feeling better after missing last weeks visit.  No falls or stumbles reported.  Pt. continues to be able to sit/stand from low commode safely in bathroom.    Patient is accompained by: Family member    Pertinent History Pt. states R knee is bothering him but it is the typical discomfort he experiences    Limitations House hold activities;Walking;Standing;Lifting    How long can you sit comfortably? 2 hours    How long can you stand comfortably? <10 min    How long can you walk comfortably? 10 min    Patient Stated Goals Improve LE strength/ gait and balance with daily tasks.  Improve gait/ safety/ progression to least assistive device.    Currently in Pain? Yes    Pain Score 1     Pain Location Knee  Pain Orientation Right    Pain Onset More than a month ago    Pain Onset More than a month ago             Ther.ex.:     4# ankle wts.: forward/ backwards //-bars 3 laps each.     Seated LAQ 10x with 4# ankle wts./ 10x with no ankle wts.     Nustep L5 B LE only 10 min., MHP provided to lumbar for comfort.    Standing hip extensions/knee flexion with 4# ankle wts. 10x    Neuro Re-ED:    Forward/lateral/backwards walking in //-bars with UE assist 4x each.  Pharmacist, hospital.     Walking with 6" hurdles in //-bars working on increasing hip flexion.  4x   Walking with 6" toe taps in //-bars working on hip flexion and standing tolerance with UE assist. 20x           PT Long Term Goals - 10/20/21 1700       PT LONG TERM GOAL #1   Title Pt. will ambulate 10 minutes with use of RW and SBA/mod.  independence to improve walking endurance/ community ambulation.    Baseline Pt. ambulates around 5-6 mintues prior to fatigue/ increase knee/ back pain.    Time 12    Period Weeks    Status New    Target Date 01/12/22      PT LONG TERM GOAL #2   Title Pt. will increase Berg balance test to >40 out of 56 to improve independence with gait/ decrease fall risk.    Baseline Berg: 22/56 (significant fall risk); 9/30 32/56.  11/10: 32 (limited by R knee pain)  2/9: 33/56 limited due to R knee and Low Back pain; 8/10: 29/56; 10/5: 38/56, 11/18/20: 28. 04/28/2021: 28/56 07/07/2021: 21/56.  11/29: 31/56 (limited by knee pain/ use of UE assist).    Time 12    Period Weeks    Status Partially Met    Target Date 01/12/22      PT LONG TERM GOAL #3   Title Pt. able to ambulate 100 feet with consistent 2 point gait pattern and use of least restrictive assistive devce to improve household mobility.    Baseline Pt. able to ambulate with use of RW/ CGA to min. A for safety.   Heavy use of B UE.; 9/30 pt able to ambulate with rollator with inconsistent 2 point gait pattern, heavy reliance on UE CGA/supervision  2/9: Pt. uses 2 point step pattern with walking with rollader with decreased stride length. Pt. would be limited due to pain in the knee walking for >100 ft.; 8/10: Pt demonstrates ability to ambulate 100' with consistent 2 point gait pattern with rollator. 11/18/20: Pt able to amb 130 ft with Rollator and 2-point gait pattern. 04/28/21: Pt able to amb 148 ft with Rollator and 2-point gait pattern 07/07/2021 Pt was able to amb 300 ft with rollator and 2 point gat pattern. Walking rollator is the least restricitive AD as this time. PT required several standing rest breaks.    Time 8    Period Weeks    Status Achieved    Target Date 07/07/21      PT LONG TERM GOAL #4   Title Pt. able to stand from normal chair with no UE assist to improve safety/independence with transfers.     Baseline Unable to stand from  standard chair without heavy UE assist. 10/6, pt unable to stand from standard chair without  UE support.  11/10: benefits from 1 UE assist    2/9: Pt. can rise from normal chair with 1 UE assist and must control descent with UE assist.; 8/10: Pt requires 2 UE for standing from standard chair height.; 10/5: Requires single UE to stand. 11/18/20: Pt requires B UE support.  04/28/21: Pt requires single UE support 07/07/2021: Pt continues to require R UE, unable to complete without UE when attempted.    Time 8    Period Weeks    Status Partially Met    Target Date 01/12/22      PT LONG TERM GOAL #5   Title .      PT LONG TERM GOAL #8   Title Pt will demonstrate ability to stand from a lowered surface below 18" with single Ue support to demonstrate improved BLE strength to improve transfers from toilet.    Baseline 10/5: Able to stand from 18" chair with single UE support with use of momentum. 11/18/20: able to after 3 attempts. 10/11: Able to complete after 2 attempts with single UE assist.    Time 12    Period Weeks    Status Partially Met    Target Date 01/12/22                   Plan - 10/20/21 1656     Clinical Impression Statement Pt. responds well to Ther. ex and is able to perform exercises with proper form. Pt. is mainly limited by fatigue and needs CGA and UE assist during all ambulation activities for safety. Pt. does have some knee pain during LAQ but finds immediate relief when changing position. Pt. benefits from cueing to increase hip flexion and standing up straight during ambulation. Pt. is limited by pain when WB for extended periods of time and needs to rest during Tx. to keep LBP from flaring up. Pt. encouraged to continue walking at home and increase walking distance to improve overall endurance.    Personal Factors and Comorbidities Age;Comorbidity 3+    Examination-Activity Limitations Toileting;Stand;Squat;Lift;Stairs    Stability/Clinical Decision Making  Evolving/Moderate complexity    Clinical Decision Making Moderate    Rehab Potential Fair    PT Frequency 1x / week    PT Duration 12 weeks    PT Treatment/Interventions Moist Heat;Functional mobility training;Therapeutic activities;Therapeutic exercise;Balance training;Patient/family education;Manual techniques;Passive range of motion;Neuromuscular re-education;Stair training;Gait training;ADLs/Self Care Home Management    PT Next Visit Plan Progress LE strengthening and balance to reduce fall risk.    PT Home Exercise Plan [facility]             Patient will benefit from skilled therapeutic intervention in order to improve the following deficits and impairments:  Abnormal gait, Decreased endurance, Decreased activity tolerance, Pain, Decreased balance, Impaired flexibility, Decreased strength, Postural dysfunction  Visit Diagnosis: Muscle weakness (generalized)  Gait difficulty  Balance problem  Abnormal posture     Problem List Patient Active Problem List   Diagnosis Date Noted   DVT femoral (deep venous thrombosis) with thrombophlebitis, left (HCC) 02/06/2020   Lymphedema 04/15/2019   Bilateral leg edema 01/25/2019   Hyponatremia 12/24/2018   SIADH (syndrome of inappropriate ADH production) ([place]) 12/24/2018   Erosion of urethra due to catheterization of urinary tract ([place]) 10/27/2018   Generalized weakness 10/14/2018   Hip fracture ([place]) 09/15/2018   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   HLD (hyperlipidemia) 03/24/2018   Contusion of right knee  02/14/2018   Bradycardia 12/28/2017   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   Tremor 09/04/2016   Chronic pain of right knee 08/10/2016   Right ankle pain 08/10/2016   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[place]) 12/11/2015   Urinary retention 09/08/2015   Lymphoma,  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[place]) 04/03/2015   Breathlessness on exertion 11/21/2014   Breath shortness 11/21/2014   Arthropathy 11/07/2014   Atrial flutter, paroxysmal ([place]) 11/07/2014   Type 2 diabetes mellitus ([place]) 11/07/2014   Benign essential tremor 11/07/2014   Benign essential HTN 11/07/2014   Mixed incontinence 11/07/2014   Hypercholesterolemia without hypertriglyceridemia 11/07/2014   Apnea, sleep 11/07/2014   Controlled type 2 diabetes mellitus without complication ([place]) 22/57/5051   Pure hypercholesterolemia 11/07/2014   Other abnormalities of gait and mobility 11/02/2011   Decreased mobility 11/02/2011   Abnormal gait 06/29/2011   Discoordination 06/29/2011   Pura Spice, PT, DPT # [id number] Cleopatra Cedar, SPT 10/20/2021, 5:04 PM  [facility] [address] [place], [place], [address] Phone: [phone]   Fax:  [phone]  Name: Mike Holloway MRN: [id number] Date of Birth: 1941/02/07

## 2021-10-21 ENCOUNTER — Other Ambulatory Visit: Payer: Self-pay

## 2021-10-21 ENCOUNTER — Encounter: Payer: Self-pay | Admitting: Urology

## 2021-10-21 ENCOUNTER — Ambulatory Visit (INDEPENDENT_AMBULATORY_CARE_PROVIDER_SITE_OTHER): Payer: Medicare Other | Admitting: Urology

## 2021-10-21 VITALS — BP 141/75 | HR 71 | Ht 70.0 in | Wt 217.0 lb

## 2021-10-21 DIAGNOSIS — R339 Retention of urine, unspecified: Secondary | ICD-10-CM

## 2021-10-26 ENCOUNTER — Encounter: Payer: Self-pay | Admitting: Physical Therapy

## 2021-10-26 ENCOUNTER — Ambulatory Visit: Payer: Medicare Other | Admitting: Physical Therapy

## 2021-10-26 ENCOUNTER — Other Ambulatory Visit: Payer: Self-pay

## 2021-10-26 DIAGNOSIS — M6281 Muscle weakness (generalized): Secondary | ICD-10-CM | POA: Diagnosis not present

## 2021-10-26 DIAGNOSIS — R269 Unspecified abnormalities of gait and mobility: Secondary | ICD-10-CM

## 2021-10-26 DIAGNOSIS — R2689 Other abnormalities of gait and mobility: Secondary | ICD-10-CM

## 2021-10-26 NOTE — Therapy (Addendum)
[facility] [address] [place], [place], [address] Phone: [phone]   Fax:  [phone]  Physical Therapy Treatment  Patient Details  Name: Mike Holloway MRN: [id number] Date of Birth: 07/12/1941 Referring Provider (PT): Dr. Rudene Christians   Encounter Date: 10/26/2021   PT End of Session - 10/26/21 1347     Visit Number 140    Number of Visits 151    Date for PT Re-Evaluation 01/12/22    Authorization - Visit Number 1    Authorization - Number of Visits 10    PT Start Time 1300    PT Stop Time 1443    PT Time Calculation (min) 52 min    Equipment Utilized During Treatment Gait belt    Activity Tolerance Patient tolerated treatment well;Patient limited by pain;Patient limited by fatigue    Behavior During Therapy [facility] for tasks assessed/performed             Past Medical History:  Diagnosis Date   Arthritis    Atrial flutter ([place])    Diabetes mellitus ([place])    Essential tremor    Essential tremor    deep brain stimulator    Hypercholesteremia    Hypertension    Incontinence    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   SIADH (syndrome of inappropriate ADH production) ([place])    Sleep apnea    Stroke [facility])     Past Surgical History:  Procedure Laterality Date   ABLATION     APPENDECTOMY     CATARACT EXTRACTION, BILATERAL     COLONOSCOPY WITH PROPOFOL N/A 03/17/2018   Procedure: COLONOSCOPY WITH PROPOFOL;  Surgeon: Jonathon Bellows, MD;  Location: [facility] 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   Subjective Assessment - 10/26/21 1325     Subjective Pt. reports R knee flare up to a 6/10 NPS in the morning. Pt. says pain subsides after applying biofreeze and is now back to normal pain. Pt.'s wife states he is having a hard time lifting R LE into pants when sitting and needs assistance.    Patient is accompained by: Family member    Pertinent History Pt. states R knee is bothering him but it is the typical discomfort he experiences    Limitations House hold activities;Walking;Standing;Lifting    How long can you sit comfortably? 2 hours    How long can you stand comfortably? <10 min    How long can you walk comfortably? 10 min    Patient Stated Goals Improve LE strength/ gait and balance with daily tasks.  Improve gait/ safety/ progression to least assistive device.    Currently in Pain? Yes    Pain Score 1     Pain Location Knee    Pain Orientation  Right    Pain Descriptors / Indicators Aching;Constant    Pain Type Chronic pain    Pain Onset More than a month ago    Pain Onset More than a month ago              Therex.   Walking forward/ backwards in //-bars 3 laps each. CGA for safety/ verbal cuing to increase step pattern/ hip flexion.    Seated LAQ 10x with 4# ankle wts./ 10x with no ankle wts.  Seated marching with 4# ankle weights. 2x10     Nustep L5 B LE only 10 min., MHP provided to lumbar for comfort.      Neuro Re-ED:    PT assessed pts. Ability to don/doff shorts while in seated position.  Limited by R hip flexion.  No issues with L hip mobility but unable to independently complete R LE step into shorts.   Forward/lateral/backwards walking in //-bars with UE assist 4x each.  Pharmacist, hospital. Pt. States back flare up.  Posture  correction/ mirror feedback.     Standing in //-bars working on increasing hip flexion (4x each).  Standing functional reaching to cones on elevated table.     Walking with 6" hurdles //-bars working on hip flexion and standing tolerance with UE assist       PT Long Term Goals - 10/20/21 1700       PT LONG TERM GOAL #1   Title Pt. will ambulate 10 minutes with use of RW and SBA/mod. independence to improve walking endurance/ community ambulation.    Baseline Pt. ambulates around 5-6 mintues prior to fatigue/ increase knee/ back pain.    Time 12    Period Weeks    Status New    Target Date 01/12/22      PT LONG TERM GOAL #2   Title Pt. will increase Berg balance test to >40 out of 56 to improve independence with gait/ decrease fall risk.    Baseline Berg: 22/56 (significant fall risk); 9/30 32/56.  11/10: 32 (limited by R knee pain)  2/9: 33/56 limited due to R knee and Low Back pain; 8/10: 29/56; 10/5: 38/56, 11/18/20: 28. 04/28/2021: 28/56 07/07/2021: 21/56.  11/29: 31/56 (limited by knee pain/ use of UE assist).    Time 12    Period Weeks    Status Partially Met    Target Date 01/12/22      PT LONG TERM GOAL #3   Title Pt. able to ambulate 100 feet with consistent 2 point gait pattern and use of least restrictive assistive devce to improve household mobility.    Baseline Pt. able to ambulate with use of RW/ CGA to min. A for safety.   Heavy use of B UE.; 9/30 pt able to ambulate with rollator with inconsistent 2 point gait pattern, heavy reliance on UE CGA/supervision  2/9: Pt. uses 2 point step pattern with walking with rollader with decreased stride length. Pt. would be limited due to pain in the knee walking for >100 ft.; 8/10: Pt demonstrates ability to ambulate 100' with consistent 2 point gait pattern with rollator. 11/18/20: Pt able to amb 130 ft with Rollator and 2-point gait pattern. 04/28/21: Pt able to amb 148 ft with Rollator and 2-point gait pattern 07/07/2021 Pt was able to  amb 300 ft with rollator and 2 point gat pattern. Walking rollator is the least restricitive AD as this time. PT required several standing rest breaks.    Time 8  Period Weeks    Status Achieved    Target Date 07/07/21      PT LONG TERM GOAL #4   Title Pt. able to stand from normal chair with no UE assist to improve safety/independence with transfers.     Baseline Unable to stand from standard chair without heavy UE assist. 10/6, pt unable to stand from standard chair without UE support.  11/10: benefits from 1 UE assist    2/9: Pt. can rise from normal chair with 1 UE assist and must control descent with UE assist.; 8/10: Pt requires 2 UE for standing from standard chair height.; 10/5: Requires single UE to stand. 11/18/20: Pt requires B UE support.  04/28/21: Pt requires single UE support 07/07/2021: Pt continues to require R UE, unable to complete without UE when attempted.    Time 8    Period Weeks    Status Partially Met    Target Date 01/12/22      PT LONG TERM GOAL #5   Title .      PT LONG TERM GOAL #8   Title Pt will demonstrate ability to stand from a lowered surface below 18" with single Ue support to demonstrate improved BLE strength to improve transfers from toilet.    Baseline 10/5: Able to stand from 18" chair with single UE support with use of momentum. 11/18/20: able to after 3 attempts. 10/11: Able to complete after 2 attempts with single UE assist.    Time 12    Period Weeks    Status Partially Met    Target Date 01/12/22                   Plan - 10/26/21 1403     Clinical Impression Statement Pt. is able to perfrom all exercises with proper form but is limited by pain and fatigue. Pt. required CGA and UE assist during all activites in the //-bars. Pt. discusses not being able to lift R LE high enough to put his clothes on in the morning and requires assistance from his wife.  PT will address limitations with seated R hip strengthening and modifying hip/knee  position.  Pt. will continue to benefit from gait exercises and building standing tolerance to improve endurance/ safety. Pt. will work on hip strengthening to increase his ability to perform ADLs.  PT went to take hip flexion mesurement but pt. could not get into supine position without increase in LBP. Pt. has noticeably less hip flexion on right side compared to left when sitting.    Personal Factors and Comorbidities Age;Comorbidity 3+    Examination-Activity Limitations Toileting;Stand;Squat;Lift;Stairs    Stability/Clinical Decision Making Evolving/Moderate complexity    Clinical Decision Making Moderate    Rehab Potential Fair    PT Frequency 1x / week    PT Duration 8 weeks    PT Treatment/Interventions Moist Heat;Functional mobility training;Therapeutic activities;Therapeutic exercise;Balance training;Patient/family education;Manual techniques;Passive range of motion;Neuromuscular re-education;Stair training;Gait training;ADLs/Self Care Home Management    PT Next Visit Plan Progress LE strengthening and balance to reduce fall risk.    PT Home Exercise Plan [email or web address]    Consulted and Agree with Plan of Care Patient;Family member/caregiver    Family Member Consulted spouse: [name]             Patient will benefit from skilled therapeutic intervention in order to improve the following deficits and impairments:  Abnormal gait, Decreased endurance, Decreased activity tolerance, Pain, Decreased balance, Impaired flexibility, Decreased strength, Postural dysfunction, Decreased range  of motion, Difficulty walking, Improper body mechanics  Visit Diagnosis: Muscle weakness (generalized)  Gait difficulty  Balance problem     Problem List Patient Active Problem List   Diagnosis Date Noted   DVT femoral (deep venous thrombosis) with thrombophlebitis, left (HCC) 02/06/2020   Lymphedema 04/15/2019   Bilateral leg edema 01/25/2019   Hyponatremia 12/24/2018   SIADH (syndrome of  inappropriate ADH production) ([place]) 12/24/2018   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) 10/27/2018   Generalized weakness 10/14/2018   Hip fracture ([place]) 09/15/2018   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   HLD (hyperlipidemia) 03/24/2018   Contusion of right knee 02/14/2018   Bradycardia 12/28/2017   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   Tremor 09/04/2016   Chronic pain of right knee 08/10/2016   Right ankle pain 08/10/2016   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[place]) 12/11/2015   Urinary retention 09/08/2015   Lymphoma, 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[place]) 04/03/2015   Breathlessness on exertion 11/21/2014   Breath shortness 11/21/2014   Arthropathy 11/07/2014   Atrial flutter, paroxysmal ([place]) 11/07/2014   Type 2 diabetes mellitus ([place]) 11/07/2014   Benign essential tremor 11/07/2014   Benign essential HTN 11/07/2014   Mixed incontinence 11/07/2014   Hypercholesterolemia without hypertriglyceridemia 11/07/2014   Apnea, sleep 11/07/2014   Controlled type 2 diabetes mellitus without complication ([place]) 81/82/9937   Pure hypercholesterolemia 11/07/2014   Other abnormalities of gait and mobility 11/02/2011   Decreased mobility 11/02/2011   Abnormal gait 06/29/2011   Discoordination 06/29/2011   Pura Spice, PT, DPT # [id number] Cleopatra Cedar, SPT 10/26/2021, 2:43 PM  [place] [facility] [address] [place], [place], [address] Phone: [phone]   Fax:  [phone]  Name: Earvin Blazier MRN: [id number] Date of Birth: [birth year]

## 2021-10-27 ENCOUNTER — Encounter: Payer: Medicare Other | Admitting: Physical Therapy

## 2021-11-03 ENCOUNTER — Encounter: Payer: Self-pay | Admitting: Physical Therapy

## 2021-11-03 ENCOUNTER — Ambulatory Visit: Payer: Medicare Other | Attending: Orthopedic Surgery | Admitting: Physical Therapy

## 2021-11-03 ENCOUNTER — Other Ambulatory Visit: Payer: Self-pay

## 2021-11-03 DIAGNOSIS — M6281 Muscle weakness (generalized): Secondary | ICD-10-CM | POA: Diagnosis not present

## 2021-11-03 DIAGNOSIS — R2689 Other abnormalities of gait and mobility: Secondary | ICD-10-CM | POA: Insufficient documentation

## 2021-11-03 DIAGNOSIS — R293 Abnormal posture: Secondary | ICD-10-CM | POA: Insufficient documentation

## 2021-11-03 DIAGNOSIS — R269 Unspecified abnormalities of gait and mobility: Secondary | ICD-10-CM | POA: Diagnosis present

## 2021-11-03 NOTE — Therapy (Addendum)
[place] [facility] [address] [place], [place], [address] Phone: [phone]   Fax:  [phone]  Physical Therapy Treatment  Patient Details  Name: Mike Holloway MRN: [id number] Date of Birth: 1941/07/31 Referring Provider (PT): Dr. Rudene Christians   Encounter Date: 11/03/2021   PT End of Session - 11/03/21 1255     Visit Number 141    Number of Visits 151    Date for PT Re-Evaluation 01/12/22    Authorization - Visit Number 2    Authorization - Number of Visits 10    PT Start Time 1937    PT Stop Time 9024    PT Time Calculation (min) 64 min    Equipment Utilized During Treatment Gait belt    Activity Tolerance Patient tolerated treatment well;Patient limited by pain;Patient limited by fatigue    Behavior During Therapy [facility] for tasks assessed/performed             Past Medical History:  Diagnosis Date   Arthritis    Atrial flutter ([place])    Diabetes mellitus ([place])    Essential tremor    Essential tremor    deep brain stimulator    Hypercholesteremia    Hypertension    Incontinence    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   SIADH (syndrome of inappropriate ADH production) ([place])    Sleep apnea    Stroke [facility])     Past Surgical History:  Procedure Laterality Date   ABLATION     APPENDECTOMY     CATARACT EXTRACTION, BILATERAL     COLONOSCOPY WITH PROPOFOL N/A 03/17/2018   Procedure: COLONOSCOPY WITH PROPOFOL;  Surgeon: Jonathon Bellows, MD;  Location: [facility] 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   Subjective Assessment - 11/03/21 1256     Subjective Pt. reports significantly less pain when arriving in the clinic. Pt. reports no LBP at start of session but has minor B knee pain.    Patient is accompained by: Family member    Pertinent History Pt. states R knee is bothering him but it is the typical discomfort he experiences    Limitations House hold activities;Walking;Standing;Lifting    How long can you sit comfortably? 2 hours    How long can you stand comfortably? <10 min    How long can you walk comfortably? 10 min    Patient Stated Goals Improve LE strength/ gait and balance with daily tasks.  Improve gait/ safety/ progression to least assistive device.    Currently in Pain? Yes    Pain Score 1     Pain Location Knee    Pain Orientation Right    Pain Onset More than a month ago    Pain Onset More than a month ago  Therex.    Walking forward/ backwards in //-bars 3 laps each. CGA for safety/ verbal cuing to increase step pattern/ hip flexion.    Seated LAQ 2x10 with 4# ankle wts.   Seated marching with 4# ankle weights. 2x10     Nustep L5 B LE only 10 min., MHP provided to lumbar for comfort.      Neuro Re-ED:     Forward/backwards walking in //-bars with UE assist 4x each.  Pharmacist, hospital. Pt. states back flare up.  Posture correction/ mirror feedback.     Walking with 6" hurdles //-bars working on hip flexion and standing tolerance with UE assist  Standing with fishing (2:20 and 4:30 minutes).     Walking in hallway/ outside down ramp with use of RW and consistent step pattern/ cadence.     PT Long Term Goals - 10/20/21 1700       PT LONG TERM GOAL #1   Title Pt. will ambulate 10 minutes with use of RW and SBA/mod.  independence to improve walking endurance/ community ambulation.    Baseline Pt. ambulates around 5-6 mintues prior to fatigue/ increase knee/ back pain.    Time 12    Period Weeks    Status New    Target Date 01/12/22      PT LONG TERM GOAL #2   Title Pt. will increase Berg balance test to >40 out of 56 to improve independence with gait/ decrease fall risk.    Baseline Berg: 22/56 (significant fall risk); 9/30 32/56.  11/10: 32 (limited by R knee pain)  2/9: 33/56 limited due to R knee and Low Back pain; 8/10: 29/56; 10/5: 38/56, 11/18/20: 28. 04/28/2021: 28/56 07/07/2021: 21/56.  11/29: 31/56 (limited by knee pain/ use of UE assist).    Time 12    Period Weeks    Status Partially Met    Target Date 01/12/22      PT LONG TERM GOAL #3   Title Pt. able to ambulate 100 feet with consistent 2 point gait pattern and use of least restrictive assistive devce to improve household mobility.    Baseline Pt. able to ambulate with use of RW/ CGA to min. A for safety.   Heavy use of B UE.; 9/30 pt able to ambulate with rollator with inconsistent 2 point gait pattern, heavy reliance on UE CGA/supervision  2/9: Pt. uses 2 point step pattern with walking with rollader with decreased stride length. Pt. would be limited due to pain in the knee walking for >100 ft.; 8/10: Pt demonstrates ability to ambulate 100' with consistent 2 point gait pattern with rollator. 11/18/20: Pt able to amb 130 ft with Rollator and 2-point gait pattern. 04/28/21: Pt able to amb 148 ft with Rollator and 2-point gait pattern 07/07/2021 Pt was able to amb 300 ft with rollator and 2 point gat pattern. Walking rollator is the least restricitive AD as this time. PT required several standing rest breaks.    Time 8    Period Weeks    Status Achieved    Target Date 07/07/21      PT LONG TERM GOAL #4   Title Pt. able to stand from normal chair with no UE assist to improve safety/independence with transfers.     Baseline Unable to stand from  standard chair without heavy UE assist. 10/6, pt unable to stand from standard chair without UE support.  11/10: benefits from 1 UE assist    2/9: Pt. can rise from normal chair  with 1 UE assist and must control descent with UE assist.; 8/10: Pt requires 2 UE for standing from standard chair height.; 10/5: Requires single UE to stand. 11/18/20: Pt requires B UE support.  04/28/21: Pt requires single UE support 07/07/2021: Pt continues to require R UE, unable to complete without UE when attempted.    Time 8    Period Weeks    Status Partially Met    Target Date 01/12/22      PT LONG TERM GOAL #5   Title .      PT LONG TERM GOAL #8   Title Pt will demonstrate ability to stand from a lowered surface below 18" with single Ue support to demonstrate improved BLE strength to improve transfers from toilet.    Baseline 10/5: Able to stand from 18" chair with single UE support with use of momentum. 11/18/20: able to after 3 attempts. 10/11: Able to complete after 2 attempts with single UE assist.    Time 12    Period Weeks    Status Partially Met    Target Date 01/12/22                   Plan - 11/03/21 1400     Clinical Impression Statement Pt. tolerates tx. well and can perform all exercises with CGA from the PT. Pt. is pain limited with R hip flexion that is stopping his ability to perform his functional ADLs Pt. will continue to progress strengthening exercises with seated R hip flexion. Pt. will also continue to work on increasing his standing tolerance to >5 minutes with no increase in LBP. Pt. will continue to benefit from gait exercises as it will increase his tolerance to perform functional ADLs. Pt. is limited by pain throughout the session but responds well to modalities during the session.    Personal Factors and Comorbidities Age;Comorbidity 3+    Examination-Activity Limitations Toileting;Stand;Squat;Lift;Stairs    Stability/Clinical Decision Making Evolving/Moderate complexity     Clinical Decision Making Moderate    Rehab Potential Fair    PT Frequency 1x / week    PT Duration 8 weeks    PT Treatment/Interventions Moist Heat;Functional mobility training;Therapeutic activities;Therapeutic exercise;Balance training;Patient/family education;Manual techniques;Passive range of motion;Neuromuscular re-education;Stair training;Gait training;ADLs/Self Care Home Management    PT Next Visit Plan Progress LE strengthening and balance to reduce fall risk.    PT Home Exercise Plan [email or web address]    Consulted and Agree with Plan of Care Patient;Family member/caregiver    Family Member Consulted spouse: [name]             Patient will benefit from skilled therapeutic intervention in order to improve the following deficits and impairments:  Abnormal gait, Decreased endurance, Decreased activity tolerance, Pain, Decreased balance, Impaired flexibility, Decreased strength, Postural dysfunction, Decreased range of motion, Difficulty walking, Improper body mechanics  Visit Diagnosis: Muscle weakness (generalized)  Gait difficulty  Balance problem     Problem List Patient Active Problem List   Diagnosis Date Noted   DVT femoral (deep venous thrombosis) with thrombophlebitis, left (HCC) 02/06/2020   Lymphedema 04/15/2019   Bilateral leg edema 01/25/2019   Hyponatremia 12/24/2018   SIADH (syndrome of inappropriate ADH production) ([place]) 12/24/2018   Erosion of urethra due to catheterization of urinary tract ([place]) 10/27/2018   Generalized weakness 10/14/2018   Hip fracture ([place]) 09/15/2018   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   HLD (hyperlipidemia) 03/24/2018   Contusion  of right knee 02/14/2018   Bradycardia 12/28/2017   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   Tremor 09/04/2016   Chronic pain of right knee 08/10/2016   Right ankle pain 08/10/2016   Chronic  venous insufficiency 05/13/2016   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[place]) 12/11/2015   Urinary retention 09/08/2015   Lymphoma, 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[place]) 04/03/2015   Breathlessness on exertion 11/21/2014   Breath shortness 11/21/2014   Arthropathy 11/07/2014   Atrial flutter, paroxysmal ([place]) 11/07/2014   Type 2 diabetes mellitus ([place]) 11/07/2014   Benign essential tremor 11/07/2014   Benign essential HTN 11/07/2014   Mixed incontinence 11/07/2014   Hypercholesterolemia without hypertriglyceridemia 11/07/2014   Apnea, sleep 11/07/2014   Controlled type 2 diabetes mellitus without complication ([place]) 91/66/0600   Pure hypercholesterolemia 11/07/2014   Other abnormalities of gait and mobility 11/02/2011   Decreased mobility 11/02/2011   Abnormal gait 06/29/2011   Discoordination 06/29/2011   Pura Spice, PT, DPT # [id number] Cleopatra Cedar, SPT 11/03/2021, 4:20 PM  [place] [facility] [address] [place], [place], [address] Phone: [phone]   Fax:  [phone]  Name: Mike Holloway MRN: [id number] Date of Birth: 1941/07/29

## 2021-11-10 ENCOUNTER — Other Ambulatory Visit: Payer: Self-pay

## 2021-11-10 ENCOUNTER — Encounter: Payer: Self-pay | Admitting: Physical Therapy

## 2021-11-10 ENCOUNTER — Ambulatory Visit: Payer: Medicare Other | Admitting: Physical Therapy

## 2021-11-10 DIAGNOSIS — R269 Unspecified abnormalities of gait and mobility: Secondary | ICD-10-CM

## 2021-11-10 DIAGNOSIS — M6281 Muscle weakness (generalized): Secondary | ICD-10-CM | POA: Diagnosis not present

## 2021-11-10 NOTE — Therapy (Addendum)
[place] °[facility] °[address] °[place], [place], [address] °Phone: [phone]   Fax:  [phone] ° °Physical Therapy Treatment ° °Patient Details  °Name: Mike Holloway °MRN: [id number] °Date of Birth: 04/14/1941 °Referring Provider (PT): Dr. Menz ° ° °Encounter Date: 11/10/2021 ° ° PT End of Session - 11/10/21 1300   ° ° Visit Number 142   ° Number of Visits 151   ° Date for PT Re-Evaluation 01/12/22   ° Authorization - Visit Number 3   ° Authorization - Number of Visits 10   ° PT Start Time 1258   ° PT Stop Time 1350   ° PT Time Calculation (min) 52 min   ° Equipment Utilized During Treatment Gait belt   ° Activity Tolerance Patient tolerated treatment well;Patient limited by pain;Patient limited by fatigue   ° Behavior During Therapy WFL for tasks assessed/performed   ° °  °  ° °  ° ° °Past Medical History:  °Diagnosis Date  ° Arthritis   ° Atrial flutter (HCC)   ° Diabetes mellitus (HCC)   ° Essential tremor   ° Essential tremor   ° deep brain stimulator   ° Hypercholesteremia   ° Hypertension   ° Incontinence   ° Non-Hodgkin lymphoma (HCC)   ° grew on the testical  ° SIADH (syndrome of inappropriate ADH production) (HCC)   ° Sleep apnea   ° Stroke (HCC)   ° ° °Past Surgical History:  °Procedure Laterality Date  ° ABLATION    ° APPENDECTOMY    ° CATARACT EXTRACTION, BILATERAL    ° COLONOSCOPY WITH PROPOFOL N/A 03/17/2018  ° Procedure: COLONOSCOPY WITH PROPOFOL;  Surgeon: Anna, Kiran, MD;  Location: [facility] ENDOSCOPY;  Service: Gastroenterology;  Laterality: N/A;  ° DEEP BRAIN STIMULATOR PLACEMENT    ° 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 ° ° Subjective Assessment - 11/10/21 1434   ° ° Subjective Pt. reports no LBP and minimal B knee pain. Pt. is feeling good and highly motivated when arriving to clinic.   ° Patient is accompained by: Family member   ° Pertinent History Pt. states R knee is bothering him but it is the typical discomfort he experiences   ° Limitations House hold activities;Walking;Standing;Lifting   ° How long can you sit comfortably? 2 hours   ° How long can you stand comfortably? <10 min   ° How long can you walk comfortably? 10 min   ° Patient Stated Goals Improve LE strength/ gait and balance with daily tasks.  Improve gait/ safety/ progression to least assistive device.   ° Currently in Pain? Yes   ° Pain Score 1    ° Pain Location Knee   ° Pain Orientation Right;Left   ° Pain Descriptors / Indicators Aching   ° Pain Type Chronic pain   ° Pain Onset More than a month ago   °   Pain Onset More than a month ago   ° °  °  ° °  ° ° °Therex.  °  °Seated ball squeeze 15x °  °Seated marching with 3# ankle weights. 2x10   °  °Nustep L5 B LE only 12 min., MHP provided to lumbar for comfort.  °  °  °Neuro Re-ED:  °   °Forward/backwards/lateral walking in //-bars with UE assist 4x each.  Mirror feedback.  Posture correction.   ° °Standing picking up cones and transferring them to standing desk in //-bars. Pt. Simulating unloading dishwasher at home. Pt. Notes flare up in LBP. °  °Standing toe taps with 6" step in //-bars working on hip flexion and standing tolerance with  B UE assist. Pt. Stood for 3 minutes and noted increase in LBP. ° °Ascending/descending stairs 3x. Pt. Requires mod A. UE assist on hand rails.  °  °Walking in hallway/ outside down ramp to car with use of RW °  ° ° ° PT Long Term Goals - 10/20/21 1700    ° °  ° PT LONG TERM GOAL #1  ° Title Pt. will ambulate 10 minutes with use of RW and SBA/mod. independence to improve walking endurance/ community ambulation.   ° Baseline Pt. ambulates around 5-6 mintues prior to fatigue/ increase knee/ back pain.   ° Time 12   ° Period Weeks   ° Status New   ° Target Date 01/12/22   °  ° PT LONG TERM GOAL #2  ° Title Pt. will increase Berg balance test to >40 out of 56 to improve independence with gait/ decrease fall risk.   ° Baseline Berg: 22/56 (significant fall risk); 9/30 32/56.  11/10: 32 (limited by R knee pain)  2/9: 33/56 limited due to R knee and Low Back pain; 8/10: 29/56; 10/5: 38/56, 11/18/20: 28. 04/28/2021: 28/56 07/07/2021: 21/56.  11/29: 31/56 (limited by knee pain/ use of UE assist).   ° Time 12   ° Period Weeks   ° Status Partially Met   ° Target Date 01/12/22   °  ° PT LONG TERM GOAL #3  ° Title Pt. able to ambulate 100 feet with consistent 2 point gait pattern and use of least restrictive assistive devce to improve household mobility.   ° Baseline Pt. able to ambulate with use of RW/ CGA to min. A for safety.   Heavy use of B UE.; 9/30 pt able to ambulate with rollator with inconsistent 2 point gait pattern, heavy reliance on UE CGA/supervision  2/9: Pt. uses 2 point step pattern with walking with rollader with decreased stride length. Pt. would be limited due to pain in the knee walking for >100 ft.; 8/10: Pt demonstrates ability to ambulate 100' with consistent 2 point gait pattern with rollator. 11/18/20: Pt able to amb 130 ft with Rollator and 2-point gait pattern. 04/28/21: Pt able to amb 148 ft with Rollator and 2-point gait pattern 07/07/2021 Pt was able to amb 300 ft with rollator and 2 point gat pattern. Walking rollator is the least restricitive AD as this time. PT required several standing rest breaks.   ° Time 8   ° Period Weeks   ° Status Achieved   ° Target Date 07/07/21   °  ° PT LONG TERM GOAL #4  ° Title Pt. able to stand from normal chair with no  UE assist to improve safety/independence with transfers.    ° Baseline Unable to stand from standard chair without heavy UE   assist. 10/6, pt unable to stand from standard chair without UE support.  11/10: benefits from 1 UE assist    2/9: Pt. can rise from normal chair with 1 UE assist and must control descent with UE assist.; 8/10: Pt requires 2 UE for standing from standard chair height.; 10/5: Requires single UE to stand. 11/18/20: Pt requires B UE support.  04/28/21: Pt requires single UE support 07/07/2021: Pt continues to require R UE, unable to complete without UE when attempted.    Time 8    Period Weeks    Status Partially Met    Target Date 01/12/22      PT LONG TERM GOAL #5   Title .      PT LONG TERM GOAL #8   Title Pt will demonstrate ability to stand from a lowered surface below 18" with single Ue support to demonstrate improved BLE strength to improve transfers from toilet.    Baseline 10/5: Able to stand from 18" chair with single UE support with use of momentum. 11/18/20: able to after 3 attempts. 10/11: Able to complete after 2 attempts with single UE assist.    Time 12    Period Weeks    Status Partially Met    Target Date 01/12/22               Plan - 11/10/21 1344     Clinical Impression Statement Pt. is able to perform all therex. with proper form but remains limited by his LBP and R hip mobility deficit. Pt. experiences a significant increase in his LBP when he stands for more than 4-5 minutes at a time. Pt. is practicing performing functional activities in the clinic to build tolerance/confidence for him to perform at home. Pt. will continue to benefit from skilled therapeutic intervention to strengthen his LE's and decrease his LBP. Pt. continues to respond from cervical STM and MHP at the end of session.    Personal Factors and Comorbidities Age;Comorbidity 3+    Examination-Activity Limitations Toileting;Stand;Squat;Lift;Stairs    Stability/Clinical Decision Making  Evolving/Moderate complexity    Clinical Decision Making Moderate    Rehab Potential Fair    PT Frequency 1x / week    PT Duration 8 weeks    PT Treatment/Interventions Moist Heat;Functional mobility training;Therapeutic activities;Therapeutic exercise;Balance training;Patient/family education;Manual techniques;Passive range of motion;Neuromuscular re-education;Stair training;Gait training;ADLs/Self Care Home Management    PT Next Visit Plan Progress LE strengthening and balance to reduce fall risk.    PT Home Exercise Plan [id number]    Consulted and Agree with Plan of Care Patient;Family member/caregiver    Family Member Consulted spouse: [name]             Patient will benefit from skilled therapeutic intervention in order to improve the following deficits and impairments:  Abnormal gait, Decreased endurance, Decreased activity tolerance, Pain, Decreased balance, Impaired flexibility, Decreased strength, Postural dysfunction, Decreased range of motion, Difficulty walking, Improper body mechanics  Visit Diagnosis: Muscle weakness (generalized)  Gait difficulty     Problem List Patient Active Problem List   Diagnosis Date Noted   DVT femoral (deep venous thrombosis) with thrombophlebitis, left ([place]) 02/06/2020   Lymphedema 04/15/2019   Bilateral leg edema 01/25/2019   Hyponatremia 12/24/2018   SIADH (syndrome of inappropriate ADH production) ([place]) 12/24/2018   Erosion of urethra due to catheterization of urinary tract ([place]) 10/27/2018   Generalized weakness 10/14/2018   Hip fracture ([place]) 09/15/2018   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  ° HLD (hyperlipidemia) 03/24/2018  ° Contusion of right knee 02/14/2018  ° Bradycardia 12/28/2017  ° 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  ° Tremor 09/04/2016  ° Chronic pain of right knee 08/10/2016  ° Right ankle pain  08/10/2016  ° 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  ° 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HCC) 12/11/2015  ° Urinary retention 09/08/2015  ° Lymphoma, 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HCC) 04/03/2015  ° Breathlessness on exertion 11/21/2014  ° Breath shortness 11/21/2014  ° Arthropathy 11/07/2014  ° Atrial flutter, paroxysmal (HCC) 11/07/2014  ° Type 2 diabetes mellitus (HCC) 11/07/2014  ° Benign essential tremor 11/07/2014  ° Benign essential HTN 11/07/2014  ° Mixed incontinence 11/07/2014  ° Hypercholesterolemia without hypertriglyceridemia 11/07/2014  ° Apnea, sleep 11/07/2014  ° Controlled type 2 diabetes mellitus without complication (HCC) 11/07/2014  ° Pure hypercholesterolemia 11/07/2014  ° Other abnormalities of gait and mobility 11/02/2011  ° Decreased mobility 11/02/2011  ° Abnormal gait 06/29/2011  ° Discoordination 06/29/2011  ° °Michael C Sherk, PT, DPT # [id number] °Corey Shedlock, SPT °11/10/2021, 3:09 PM ° °[place] °[facility] °[address] °[place], [place], [address] °Phone: [phone]   Fax:  [phone] ° °Name: Mike Holloway °MRN: [id number] °Date of Birth: 07/26/1941 ° ° ° °

## 2021-11-13 NOTE — Progress Notes (Signed)
Cath Change/ Replacement  Patient is present today for a catheter change due to urinary retention.  9 ml of water was removed from the balloon, a 16 FR foley cath was removed with out difficulty.  Patient was cleaned and prepped in a sterile fashion with betadine. A 16 FR foley cath was replaced into the bladder no complications were noted Urine return was noted  20 ml and urine was clear/yellow  in color. The balloon was filled with 24ml of sterile water. A  bag was attached for drainage.  A night bag was also given to the patient and patient was given instruction on how to change from one bag to another. Patient was given proper instruction on catheter care.    Performed by: Zara Council, PA-C  Follow up: Follow up in one month for Foley catheter exchange   I,Stevin Bielinski,acting as a scribe for [facility], PA-C.,have documented all relevant documentation on the behalf of Violeta Lecount, PA-C,as directed by  [facility], PA-C while in the presence of Josue Kass, PA-C.

## 2021-11-16 ENCOUNTER — Other Ambulatory Visit: Payer: Self-pay

## 2021-11-16 ENCOUNTER — Encounter: Payer: Self-pay | Admitting: Urology

## 2021-11-16 ENCOUNTER — Ambulatory Visit (INDEPENDENT_AMBULATORY_CARE_PROVIDER_SITE_OTHER): Payer: Medicare Other | Admitting: Urology

## 2021-11-16 VITALS — BP 162/78 | HR 70 | Ht 68.0 in | Wt 224.0 lb

## 2021-11-16 DIAGNOSIS — Z978 Presence of other specified devices: Secondary | ICD-10-CM

## 2021-11-17 ENCOUNTER — Ambulatory Visit: Payer: Medicare Other | Admitting: Physical Therapy

## 2021-11-17 ENCOUNTER — Encounter: Payer: Self-pay | Admitting: Physical Therapy

## 2021-11-17 DIAGNOSIS — R269 Unspecified abnormalities of gait and mobility: Secondary | ICD-10-CM

## 2021-11-17 DIAGNOSIS — M6281 Muscle weakness (generalized): Secondary | ICD-10-CM

## 2021-11-17 DIAGNOSIS — R2689 Other abnormalities of gait and mobility: Secondary | ICD-10-CM

## 2021-11-17 NOTE — Therapy (Addendum)
[facility] [address] [place], [place], [address] Phone: [phone]   Fax:  [phone]  Physical Therapy Treatment  Patient Details  Name: Mike Holloway MRN: [id number] Date of Birth: Apr 20, 1941 Referring Provider (PT): Dr. Rudene Christians   Encounter Date: 11/17/2021   PT End of Session - 11/17/21 1300     Visit Number 143    Number of Visits 151    Date for PT Re-Evaluation 01/12/22    Authorization - Visit Number 4    Authorization - Number of Visits 10    PT Start Time 1300    PT Stop Time 1351    PT Time Calculation (min) 51 min    Equipment Utilized During Treatment Gait belt    Activity Tolerance Patient tolerated treatment well;Patient limited by pain;Patient limited by fatigue    Behavior During Therapy [facility] for tasks assessed/performed             Past Medical History:  Diagnosis Date   Arthritis    Atrial flutter ([place])    Diabetes mellitus ([place])    Essential tremor    Essential tremor    deep brain stimulator    Hypercholesteremia    Hypertension    Incontinence    Non-Hodgkin lymphoma ([place])    grew on the testical   SIADH (syndrome of inappropriate ADH production) ([place])    Sleep apnea    Stroke [facility])     Past Surgical History:  Procedure Laterality Date   ABLATION     APPENDECTOMY     CATARACT EXTRACTION, BILATERAL     COLONOSCOPY WITH PROPOFOL N/A 03/17/2018   Procedure: COLONOSCOPY WITH PROPOFOL;  Surgeon: Jonathon Bellows, MD;  Location: [facility] 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   Subjective Assessment - 11/17/21 1301     Subjective Pt. reports some LBP when arriving to clinic but notes he had a good weekend with minimal pain.    Patient is accompained by: Family member    Pertinent History Pt. states R knee is bothering him but it is the typical discomfort he experiences    Limitations House hold activities;Walking;Standing;Lifting    How long can you sit comfortably? 2 hours    How long can you stand comfortably? <10 min    How long can you walk comfortably? 10 min    Patient Stated Goals Improve LE strength/ gait and balance with daily tasks.  Improve gait/ safety/ progression to least assistive device.    Currently in Pain? Yes    Pain Score 3     Pain Location Back    Pain Onset More than a month ago    Pain Score 1    Pain Location Knee    Pain Orientation Right    Pain Onset  More than a month ago            Therex.    Seated ball squeeze 15x   Seated marching with 4# ankle wts. 2x10     Seated LAQ 2x10 with 4# ankle wts.   Nustep L5 B LE only 12 min., MHP provided to lumbar for comfort.      Neuro Re-ED:   Standing with fishing ( 5 minutes). Pt. Notes increase in LBP (7/10NPS)  Forward/backwards/lateral walking in //-bars with UE assist 4x each.  Pharmacist, hospital.  Posture correction.     Standing toe taps with 6" step in //-bars working on hip flexion and standing tolerance with  B UE assist. Pt. Stood for 3 minutes and noted increase in LBP.   Walking in hallway/ outside down ramp to car with use of RW    PT Long Term Goals - 10/20/21 1700       PT LONG TERM GOAL #1   Title Pt. will ambulate 10 minutes with use of RW and SBA/mod. independence to improve walking endurance/ community ambulation.    Baseline Pt.  ambulates around 5-6 mintues prior to fatigue/ increase knee/ back pain.    Time 12    Period Weeks    Status New    Target Date 01/12/22      PT LONG TERM GOAL #2   Title Pt. will increase Berg balance test to >40 out of 56 to improve independence with gait/ decrease fall risk.    Baseline Berg: 22/56 (significant fall risk); 9/30 32/56.  11/10: 32 (limited by R knee pain)  2/9: 33/56 limited due to R knee and Low Back pain; 8/10: 29/56; 10/5: 38/56, 11/18/20: 28. 04/28/2021: 28/56 07/07/2021: 21/56.  11/29: 31/56 (limited by knee pain/ use of UE assist).    Time 12    Period Weeks    Status Partially Met    Target Date 01/12/22      PT LONG TERM GOAL #3   Title Pt. able to ambulate 100 feet with consistent 2 point gait pattern and use of least restrictive assistive devce to improve household mobility.    Baseline Pt. able to ambulate with use of RW/ CGA to min. A for safety.   Heavy use of B UE.; 9/30 pt able to ambulate with rollator with inconsistent 2 point gait pattern, heavy reliance on UE CGA/supervision  2/9: Pt. uses 2 point step pattern with walking with rollader with decreased stride length. Pt. would be limited due to pain in the knee walking for >100 ft.; 8/10: Pt demonstrates ability to ambulate 100' with consistent 2 point gait pattern with rollator. 11/18/20: Pt able to amb 130 ft with Rollator and 2-point gait pattern. 04/28/21: Pt able to amb 148 ft with Rollator and 2-point gait pattern 07/07/2021 Pt was able to amb 300 ft with rollator and 2 point gat pattern. Walking rollator is the least restricitive AD as this time. PT required several standing rest breaks.    Time 8    Period Weeks    Status Achieved    Target Date 07/07/21      PT LONG TERM GOAL #4   Title Pt. able to stand from normal chair with no UE assist to improve safety/independence with transfers.     Baseline Unable to stand from standard chair without heavy UE assist. 10/6, pt unable to stand from standard chair  without UE support.  11/10: benefits from 1 UE assist    2/9: Pt.  can rise from normal chair with 1 UE assist and must control descent with UE assist.; 8/10: Pt requires 2 UE for standing from standard chair height.; 10/5: Requires single UE to stand. 11/18/20: Pt requires B UE support.  04/28/21: Pt requires single UE support 07/07/2021: Pt continues to require R UE, unable to complete without UE when attempted.    Time 8    Period Weeks    Status Partially Met    Target Date 01/12/22      PT LONG TERM GOAL #5   Title .      PT LONG TERM GOAL #8   Title Pt will demonstrate ability to stand from a lowered surface below 18" with single Ue support to demonstrate improved BLE strength to improve transfers from toilet.    Baseline 10/5: Able to stand from 18" chair with single UE support with use of momentum. 11/18/20: able to after 3 attempts. 10/11: Able to complete after 2 attempts with single UE assist.    Time 12    Period Weeks    Status Partially Met    Target Date 01/12/22                   Plan - 11/17/21 1535     Clinical Impression Statement Pt. is able to perform 5 minutes of standing at the beginning of the session but does note a major increase in LBP. Pt. states he has limitations during his ADLs due to LBP and R LE weakness/pain. Pt. will continue to benefit from skilled PT to increase standing capacity and focus on R hip AROM to make it easier for him to perform ADLs. Pt. will continue to receive cueing during gait exercises and have PT CGA during all activities for safety. Pt. is able to ambulate with CGA and minimal cueing with a RW during PT.    Personal Factors and Comorbidities Age;Comorbidity 3+    Examination-Activity Limitations Toileting;Stand;Squat;Lift;Stairs    Stability/Clinical Decision Making Evolving/Moderate complexity    Clinical Decision Making Moderate    Rehab Potential Fair    PT Frequency 1x / week    PT Duration 8 weeks    PT  Treatment/Interventions Moist Heat;Functional mobility training;Therapeutic activities;Therapeutic exercise;Balance training;Patient/family education;Manual techniques;Passive range of motion;Neuromuscular re-education;Stair training;Gait training;ADLs/Self Care Home Management    PT Next Visit Plan Progress LE strengthening and balance to reduce fall risk.    PT Home Exercise Plan [id number]    Consulted and Agree with Plan of Care Patient;Family member/caregiver    Family Member Consulted spouse: [name]             Patient will benefit from skilled therapeutic intervention in order to improve the following deficits and impairments:  Abnormal gait, Decreased endurance, Decreased activity tolerance, Pain, Decreased balance, Impaired flexibility, Decreased strength, Postural dysfunction, Decreased range of motion, Difficulty walking, Improper body mechanics  Visit Diagnosis: Muscle weakness (generalized)  Gait difficulty  Balance problem     Problem List Patient Active Problem List   Diagnosis Date Noted   DVT femoral (deep venous thrombosis) with thrombophlebitis, left ([place]) 02/06/2020   Lymphedema 04/15/2019   Bilateral leg edema 01/25/2019   Hyponatremia 12/24/2018   SIADH (syndrome of inappropriate ADH production) ([place]) 12/24/2018   Erosion of urethra due to catheterization of urinary tract ([place]) 10/27/2018   Generalized weakness 10/14/2018   Hip fracture ([place]) 09/15/2018   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  HLD (hyperlipidemia) 03/24/2018   Contusion of right knee 02/14/2018   Bradycardia 12/28/2017   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   Tremor 09/04/2016   Chronic pain of right knee 08/10/2016   Right ankle pain 08/10/2016   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[place]) 12/11/2015   Urinary retention 09/08/2015    Lymphoma, 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[place]) 04/03/2015   Breathlessness on exertion 11/21/2014   Breath shortness 11/21/2014   Arthropathy 11/07/2014   Atrial flutter, paroxysmal ([place]) 11/07/2014   Type 2 diabetes mellitus ([place]) 11/07/2014   Benign essential tremor 11/07/2014   Benign essential HTN 11/07/2014   Mixed incontinence 11/07/2014   Hypercholesterolemia without hypertriglyceridemia 11/07/2014   Apnea, sleep 11/07/2014   Controlled type 2 diabetes mellitus without complication ([place]) 27/25/3664   Pure hypercholesterolemia 11/07/2014   Other abnormalities of gait and mobility 11/02/2011   Decreased mobility 11/02/2011   Abnormal gait 06/29/2011   Discoordination 06/29/2011   Pura Spice, PT, DPT # [id number] Cleopatra Cedar, SPT 11/17/2021, 4:32 PM  [facility] [address] [place], [place], [address] Phone: [phone]   Fax:  [phone]  Name: Mike Holloway MRN: [id number] Date of Birth: 12-16-1940

## 2021-11-24 ENCOUNTER — Encounter: Payer: Self-pay | Admitting: Physical Therapy

## 2021-11-24 ENCOUNTER — Ambulatory Visit: Payer: Medicare Other | Admitting: Physical Therapy

## 2021-11-24 ENCOUNTER — Other Ambulatory Visit: Payer: Self-pay

## 2021-11-24 DIAGNOSIS — M6281 Muscle weakness (generalized): Secondary | ICD-10-CM | POA: Diagnosis not present

## 2021-11-24 DIAGNOSIS — R2689 Other abnormalities of gait and mobility: Secondary | ICD-10-CM

## 2021-11-24 DIAGNOSIS — R293 Abnormal posture: Secondary | ICD-10-CM

## 2021-11-24 DIAGNOSIS — R269 Unspecified abnormalities of gait and mobility: Secondary | ICD-10-CM

## 2021-11-24 NOTE — Therapy (Signed)
[facility] [address] [place], [place], [address] Phone: [phone]   Fax:  [phone]  Physical Therapy Treatment  Patient Details  Name: Mike Holloway MRN: [id number] Date of Birth: [birth year] Referring Provider (PT): Dr. Rudene Christians   Encounter Date: 11/24/2021   PT End of Session - 11/24/21 1307     Visit Number 144    Number of Visits 151    Date for PT Re-Evaluation 01/12/22    Authorization - Visit Number 5    Authorization - Number of Visits 10    PT Start Time 6269    PT Stop Time 4854    PT Time Calculation (min) 55 min    Equipment Utilized During Treatment Gait belt    Activity Tolerance Patient tolerated treatment well;Patient limited by pain;Patient limited by fatigue    Behavior During Therapy [facility] for tasks assessed/performed             Past Medical History:  Diagnosis Date   Arthritis    Atrial flutter ([place])    Diabetes mellitus ([place])    Essential tremor    Essential tremor    deep brain stimulator    Hypercholesteremia    Hypertension    Incontinence    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   SIADH (syndrome of inappropriate ADH production) ([place])    Sleep apnea    Stroke [facility])     Past Surgical History:  Procedure Laterality Date   ABLATION     APPENDECTOMY     CATARACT EXTRACTION, BILATERAL     COLONOSCOPY WITH PROPOFOL N/A 03/17/2018   Procedure: COLONOSCOPY WITH PROPOFOL;  Surgeon: Jonathon Bellows, MD;  Location: [facility] 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   Subjective Assessment - 11/24/21 1306     Subjective Pt. reports no new issues.  Pt. went to church on Saturday with no issues reported.    Patient is accompained by: Family member    Pertinent History Pt. states R knee is bothering him but it is the typical discomfort he experiences    Limitations House hold activities;Walking;Standing;Lifting    How long can you sit comfortably? 2 hours    How long can you stand comfortably? <10 min    How long can you walk comfortably? 10 min    Patient Stated Goals Improve LE strength/ gait and balance with daily tasks.  Improve gait/ safety/ progression to least assistive device.    Currently in Pain? Yes    Pain Score 1     Pain Location Back    Pain Orientation Right;Left    Pain Descriptors / Indicators Aching    Pain Onset More than a month ago    Pain Onset More than a month ago  Therex.    Seated marching with 4# ankle wts. 2x10.  Hip adduction with ball (controlled holds).        Seated LAQ 2x10 with 4# ankle wts.  Standing B UE wand ex.:     Nustep L5 B LE only 12+ min., MHP provided to lumbar for comfort.       Neuro Re-ED:   Forward/backwards/lateral walking in //-bars with UE assist 4x each.  Pharmacist, hospital.  Posture correction.    Standing toe taps with 6" step in //-bars working on hip flexion and standing tolerance with  B UE assist.    Ascending/descending stairs with step to pattern (leading with L LE).     Walking in hallway/ outside down ramp to car with use of RW        PT Long Term Goals - 10/20/21 1700       PT LONG TERM GOAL #1   Title Pt. will ambulate 10 minutes with use of RW and SBA/mod. independence to improve walking endurance/ community ambulation.     Baseline Pt. ambulates around 5-6 mintues prior to fatigue/ increase knee/ back pain.    Time 12    Period Weeks    Status New    Target Date 01/12/22      PT LONG TERM GOAL #2   Title Pt. will increase Berg balance test to >40 out of 56 to improve independence with gait/ decrease fall risk.    Baseline Berg: 22/56 (significant fall risk); 9/30 32/56.  11/10: 32 (limited by R knee pain)  2/9: 33/56 limited due to R knee and Low Back pain; 8/10: 29/56; 10/5: 38/56, 11/18/20: 28. 04/28/2021: 28/56 07/07/2021: 21/56.  11/29: 31/56 (limited by knee pain/ use of UE assist).    Time 12    Period Weeks    Status Partially Met    Target Date 01/12/22      PT LONG TERM GOAL #3   Title Pt. able to ambulate 100 feet with consistent 2 point gait pattern and use of least restrictive assistive devce to improve household mobility.    Baseline Pt. able to ambulate with use of RW/ CGA to min. A for safety.   Heavy use of B UE.; 9/30 pt able to ambulate with rollator with inconsistent 2 point gait pattern, heavy reliance on UE CGA/supervision  2/9: Pt. uses 2 point step pattern with walking with rollader with decreased stride length. Pt. would be limited due to pain in the knee walking for >100 ft.; 8/10: Pt demonstrates ability to ambulate 100' with consistent 2 point gait pattern with rollator. 11/18/20: Pt able to amb 130 ft with Rollator and 2-point gait pattern. 04/28/21: Pt able to amb 148 ft with Rollator and 2-point gait pattern 07/07/2021 Pt was able to amb 300 ft with rollator and 2 point gat pattern. Walking rollator is the least restricitive AD as this time. PT required several standing rest breaks.    Time 8    Period Weeks    Status Achieved    Target Date 07/07/21      PT LONG TERM GOAL #4   Title Pt. able to stand from normal chair with no UE assist to improve safety/independence with transfers.     Baseline Unable to stand from standard chair without heavy UE assist. 10/6, pt unable to stand from  standard chair without UE support.  11/10: benefits from 1 UE assist    2/9: Pt. can rise from normal chair with 1 UE  assist and must control descent with UE assist.; 8/10: Pt requires 2 UE for standing from standard chair height.; 10/5: Requires single UE to stand. 11/18/20: Pt requires B UE support.  04/28/21: Pt requires single UE support 07/07/2021: Pt continues to require R UE, unable to complete without UE when attempted.    Time 8    Period Weeks    Status Partially Met    Target Date 01/12/22      PT LONG TERM GOAL #5   Title .      PT LONG TERM GOAL #8   Title Pt will demonstrate ability to stand from a lowered surface below 18" with single Ue support to demonstrate improved BLE strength to improve transfers from toilet.    Baseline 10/5: Able to stand from 18" chair with single UE support with use of momentum. 11/18/20: able to after 3 attempts. 10/11: Able to complete after 2 attempts with single UE assist.    Time 12    Period Weeks    Status Partially Met    Target Date 01/12/22               Plan - 11/24/21 1307     Clinical Impression Statement Pt. limited by knee pain with resisted LAQ/ standing ther.ex. requiring a seated rest break to calm down knee pain symptoms.  Pt. did well with standing shoulder ex. but requires cuing to correct upright posture/ head position.  No LOB during tx. session and PT encouraged pt. to start walking outside with wife assist.  Pt. demonstrates ability to manage ramp, which is similar to pts. driveway.  Pt. will continue to benefit from skilled PT services to increase LE strength/ manage back pain to improve functional mobility.    Personal Factors and Comorbidities Age;Comorbidity 3+    Examination-Activity Limitations Toileting;Stand;Squat;Lift;Stairs    Stability/Clinical Decision Making Evolving/Moderate complexity    Clinical Decision Making Moderate    Rehab Potential Fair    PT Frequency 1x / week    PT Duration 8 weeks    PT  Treatment/Interventions Moist Heat;Functional mobility training;Therapeutic activities;Therapeutic exercise;Balance training;Patient/family education;Manual techniques;Passive range of motion;Neuromuscular re-education;Stair training;Gait training;ADLs/Self Care Home Management    PT Next Visit Plan Progress LE strengthening and balance to reduce fall risk.    PT Home Exercise Plan [email or web address]    Consulted and Agree with Plan of Care Patient;Family member/caregiver    Family Member Consulted spouse: [name]             Patient will benefit from skilled therapeutic intervention in order to improve the following deficits and impairments:  Abnormal gait, Decreased endurance, Decreased activity tolerance, Pain, Decreased balance, Impaired flexibility, Decreased strength, Postural dysfunction, Decreased range of motion, Difficulty walking, Improper body mechanics  Visit Diagnosis: Muscle weakness (generalized)  Gait difficulty  Balance problem  Abnormal posture     Problem List Patient Active Problem List   Diagnosis Date Noted   DVT femoral (deep venous thrombosis) with thrombophlebitis, left ([facility]) 02/06/2020   Lymphedema 04/15/2019   Bilateral leg edema 01/25/2019   Hyponatremia 12/24/2018   SIADH (syndrome of inappropriate ADH production) ([place]) 12/24/2018   Erosion of urethra due to catheterization of urinary tract ([place]) 10/27/2018   Generalized weakness 10/14/2018   Hip fracture ([place]) 09/15/2018   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   HLD (hyperlipidemia) 03/24/2018   Contusion of right knee 02/14/2018   Bradycardia 12/28/2017   Status post  right unicompartmental knee replacement 11/03/2017   Tremor 09/04/2016   Chronic pain of right knee 08/10/2016   Right ankle pain 08/10/2016   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[employer]) 12/11/2015   Urinary retention  09/08/2015   Lymphoma, 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[place]) 04/03/2015   Breathlessness on exertion 11/21/2014   Breath shortness 11/21/2014   Arthropathy 11/07/2014   Atrial flutter, paroxysmal ([place]) 11/07/2014   Type 2 diabetes mellitus ([place]) 11/07/2014   Benign essential tremor 11/07/2014   Benign essential HTN 11/07/2014   Mixed incontinence 11/07/2014   Hypercholesterolemia without hypertriglyceridemia 11/07/2014   Apnea, sleep 11/07/2014   Controlled type 2 diabetes mellitus without complication ([place]) 48/88/9169   Pure hypercholesterolemia 11/07/2014   Other abnormalities of gait and mobility 11/02/2011   Decreased mobility 11/02/2011   Abnormal gait 06/29/2011   Discoordination 06/29/2011   Pura Spice, PT, DPT # [id number] 11/24/2021, 2:27 PM  [place] [facility] [address]. [place], [place], [address] Phone: [phone]   Fax:  [phone]  Name: Mike Holloway MRN: [id number] Date of Birth: 05-Sep-1941

## 2021-12-01 ENCOUNTER — Encounter: Payer: Self-pay | Admitting: Physical Therapy

## 2021-12-01 ENCOUNTER — Ambulatory Visit: Payer: Medicare Other | Attending: Orthopedic Surgery | Admitting: Physical Therapy

## 2021-12-01 ENCOUNTER — Other Ambulatory Visit: Payer: Self-pay

## 2021-12-01 DIAGNOSIS — R293 Abnormal posture: Secondary | ICD-10-CM | POA: Insufficient documentation

## 2021-12-01 DIAGNOSIS — R269 Unspecified abnormalities of gait and mobility: Secondary | ICD-10-CM | POA: Diagnosis present

## 2021-12-01 DIAGNOSIS — M6281 Muscle weakness (generalized): Secondary | ICD-10-CM | POA: Diagnosis not present

## 2021-12-01 DIAGNOSIS — R2689 Other abnormalities of gait and mobility: Secondary | ICD-10-CM | POA: Insufficient documentation

## 2021-12-01 NOTE — Therapy (Signed)
[facility] [facility] [address] [place], [place], [address] Phone: [phone]   Fax:  [phone]  Physical Therapy Treatment  Patient Details  Name: Mike Holloway MRN: [id number] Date of Birth: 1941-06-28 Referring Provider (PT): Dr. Rudene Christians   Encounter Date: 12/01/2021   PT End of Session - 12/01/21 1255     Visit Number 145    Number of Visits 151    Date for PT Re-Evaluation 01/12/22    Authorization - Visit Number 6    Authorization - Number of Visits 10    PT Start Time 0109    PT Stop Time 1351    PT Time Calculation (min) 56 min    Equipment Utilized During Treatment Gait belt    Activity Tolerance Patient tolerated treatment well;Patient limited by pain;Patient limited by fatigue    Behavior During Therapy [facility] for tasks assessed/performed             Past Medical History:  Diagnosis Date   Arthritis    Atrial flutter ([place])    Diabetes mellitus ([place])    Essential tremor    Essential tremor    deep brain stimulator    Hypercholesteremia    Hypertension    Incontinence    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   SIADH (syndrome of inappropriate ADH production) ([place])    Sleep apnea    Stroke [facility])     Past Surgical History:  Procedure Laterality Date   ABLATION     APPENDECTOMY     CATARACT EXTRACTION, BILATERAL     COLONOSCOPY WITH PROPOFOL N/A 03/17/2018   Procedure: COLONOSCOPY WITH PROPOFOL;  Surgeon: Jonathon Bellows, MD;  Location: [facility] 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   Subjective Assessment - 12/01/21 1254     Subjective Pt. reports minimal R knee/ L low back discomfort prior to PT tx. session.  1/10 pain reported.  Pt. went to fish fry this weekend.    Patient is accompained by: Family member    Pertinent History Pt. states R knee is bothering him but it is the typical discomfort he experiences    Limitations House hold activities;Walking;Standing;Lifting    How long can you sit comfortably? 2 hours    How long can you stand comfortably? <10 min    How long can you walk comfortably? 10 min    Patient Stated Goals Improve LE strength/ gait and balance with daily tasks.  Improve gait/ safety/ progression to least assistive device.    Currently in Pain? Yes    Pain Score 1     Pain Location Back    Pain Orientation Lower;Left    Pain Onset More than a month ago    Pain Onset More than a month  ago              Therex.    Seated marching/LAQ/heel and toe raises with 4# ankle wts. 2x10.        Nustep L5 B LE only 12+ min., MHP provided to lumbar for comfort.     Neuro Re-ED:    Forward/backwards/lateral walking in //-bars with UE assist 2 laps each.  Pharmacist, hospital.  Posture correction.  Heavy UE assist.  No rest breaks until completion of all 6 laps.    Walking cone taps in //-bars with correction of posture (forward)- 2 laps.  Pt. working on hip flexion and standing tolerance with  B UE assist.   Amb. In hallway for 5 min. 11 sec. Before requesting rest break secondary to increase L low back/knee discomfort.     Ascending/descending stairs with step to pattern (leading with L LE).     Walking in hallway/ outside down ramp to car with use of RW       PT Long Term Goals - 10/20/21 1700       PT LONG TERM GOAL #1    Title Pt. will ambulate 10 minutes with use of RW and SBA/mod. independence to improve walking endurance/ community ambulation.    Baseline Pt. ambulates around 5-6 mintues prior to fatigue/ increase knee/ back pain.    Time 12    Period Weeks    Status New    Target Date 01/12/22      PT LONG TERM GOAL #2   Title Pt. will increase Berg balance test to >40 out of 56 to improve independence with gait/ decrease fall risk.    Baseline Berg: 22/56 (significant fall risk); 9/30 32/56.  11/10: 32 (limited by R knee pain)  2/9: 33/56 limited due to R knee and Low Back pain; 8/10: 29/56; 10/5: 38/56, 11/18/20: 28. 04/28/2021: 28/56 07/07/2021: 21/56.  11/29: 31/56 (limited by knee pain/ use of UE assist).    Time 12    Period Weeks    Status Partially Met    Target Date 01/12/22      PT LONG TERM GOAL #3   Title Pt. able to ambulate 100 feet with consistent 2 point gait pattern and use of least restrictive assistive devce to improve household mobility.    Baseline Pt. able to ambulate with use of RW/ CGA to min. A for safety.   Heavy use of B UE.; 9/30 pt able to ambulate with rollator with inconsistent 2 point gait pattern, heavy reliance on UE CGA/supervision  2/9: Pt. uses 2 point step pattern with walking with rollader with decreased stride length. Pt. would be limited due to pain in the knee walking for >100 ft.; 8/10: Pt demonstrates ability to ambulate 100' with consistent 2 point gait pattern with rollator. 11/18/20: Pt able to amb 130 ft with Rollator and 2-point gait pattern. 04/28/21: Pt able to amb 148 ft with Rollator and 2-point gait pattern 07/07/2021 Pt was able to amb 300 ft with rollator and 2 point gat pattern. Walking rollator is the least restricitive AD as this time. PT required several standing rest breaks.    Time 8    Period Weeks    Status Achieved    Target Date 07/07/21      PT LONG TERM GOAL #4   Title Pt. able to stand from normal chair with no UE assist to improve  safety/independence with transfers.     Baseline Unable to stand from standard chair without  heavy UE assist. 10/6, pt unable to stand from standard chair without UE support.  11/10: benefits from 1 UE assist    2/9: Pt. can rise from normal chair with 1 UE assist and must control descent with UE assist.; 8/10: Pt requires 2 UE for standing from standard chair height.; 10/5: Requires single UE to stand. 11/18/20: Pt requires B UE support.  04/28/21: Pt requires single UE support 07/07/2021: Pt continues to require R UE, unable to complete without UE when attempted.    Time 8    Period Weeks    Status Partially Met    Target Date 01/12/22      PT LONG TERM GOAL #5   Title .      PT LONG TERM GOAL #8   Title Pt will demonstrate ability to stand from a lowered surface below 18" with single Ue support to demonstrate improved BLE strength to improve transfers from toilet.    Baseline 10/5: Able to stand from 18" chair with single UE support with use of momentum. 11/18/20: able to after 3 attempts. 10/11: Able to complete after 2 attempts with single UE assist.    Time 12    Period Weeks    Status Partially Met    Target Date 01/12/22                   Plan - 12/01/21 1256     Clinical Impression Statement PT tx. focused on increasing standing/ walking tolerance to allow pt. to walk more outside at home/ in community.  Pt. limited by increase in L low back pain and R knee discomfort during walking tasks.  No balance issues but requires use of heavy UE assist on rollator/ //-bars secondary to pain in R knee.  Pt. remains motivated to progress mod. I with daily walking/ stair climbing.    Personal Factors and Comorbidities Age;Comorbidity 3+    Examination-Activity Limitations Toileting;Stand;Squat;Lift;Stairs    Stability/Clinical Decision Making Evolving/Moderate complexity    Clinical Decision Making Moderate    Rehab Potential Fair    PT Frequency 1x / week    PT Duration 8 weeks    PT  Treatment/Interventions Moist Heat;Functional mobility training;Therapeutic activities;Therapeutic exercise;Balance training;Patient/family education;Manual techniques;Passive range of motion;Neuromuscular re-education;Stair training;Gait training;ADLs/Self Care Home Management    PT Next Visit Plan Progress LE strengthening and balance to reduce fall risk.    PT Home Exercise Plan [email or web address]    Consulted and Agree with Plan of Care Patient;Family member/caregiver    Family Member Consulted spouse: [name]             Patient will benefit from skilled therapeutic intervention in order to improve the following deficits and impairments:  Abnormal gait, Decreased endurance, Decreased activity tolerance, Pain, Decreased balance, Impaired flexibility, Decreased strength, Postural dysfunction, Decreased range of motion, Difficulty walking, Improper body mechanics  Visit Diagnosis: Muscle weakness (generalized)  Gait difficulty  Balance problem  Abnormal posture     Problem List Patient Active Problem List   Diagnosis Date Noted   DVT femoral (deep venous thrombosis) with thrombophlebitis, left ([place]) 02/06/2020   Lymphedema 04/15/2019   Bilateral leg edema 01/25/2019   Hyponatremia 12/24/2018   SIADH (syndrome of inappropriate ADH production) ([place]) 12/24/2018   Erosion of urethra due to catheterization of urinary tract ([place]) 10/27/2018   Generalized weakness 10/14/2018   Hip fracture ([place]) 09/15/2018   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  HLD (hyperlipidemia) 03/24/2018   Contusion of right knee 02/14/2018   Bradycardia 12/28/2017   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   Tremor 09/04/2016   Chronic pain of right knee 08/10/2016   Right ankle pain 08/10/2016   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[place]) 12/11/2015   Urinary retention  09/08/2015   Lymphoma, 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[place]) 04/03/2015   Breathlessness on exertion 11/21/2014   Breath shortness 11/21/2014   Arthropathy 11/07/2014   Atrial flutter, paroxysmal ([place]) 11/07/2014   Type 2 diabetes mellitus ([place]) 11/07/2014   Benign essential tremor 11/07/2014   Benign essential HTN 11/07/2014   Mixed incontinence 11/07/2014   Hypercholesterolemia without hypertriglyceridemia 11/07/2014   Apnea, sleep 11/07/2014   Controlled type 2 diabetes mellitus without complication ([place]) 97/28/2060   Pure hypercholesterolemia 11/07/2014   Other abnormalities of gait and mobility 11/02/2011   Decreased mobility 11/02/2011   Abnormal gait 06/29/2011   Discoordination 06/29/2011   Pura Spice, PT, DPT # [id number]  12/01/2021, 4:17 PM  [place] [facility] [address] [place], [place], [address] Phone: [phone]   Fax:  [phone]  Name: Musab Wingard MRN: [id number] Date of Birth: October 23, 1940

## 2021-12-08 ENCOUNTER — Other Ambulatory Visit: Payer: Self-pay

## 2021-12-08 ENCOUNTER — Ambulatory Visit: Payer: Medicare Other | Admitting: Physical Therapy

## 2021-12-08 ENCOUNTER — Encounter: Payer: Self-pay | Admitting: Physical Therapy

## 2021-12-08 DIAGNOSIS — M6281 Muscle weakness (generalized): Secondary | ICD-10-CM | POA: Diagnosis not present

## 2021-12-08 DIAGNOSIS — R2689 Other abnormalities of gait and mobility: Secondary | ICD-10-CM

## 2021-12-08 DIAGNOSIS — R293 Abnormal posture: Secondary | ICD-10-CM

## 2021-12-08 DIAGNOSIS — R269 Unspecified abnormalities of gait and mobility: Secondary | ICD-10-CM

## 2021-12-08 NOTE — Therapy (Signed)
[place] ?[facility] ?[address] [place], [place], [address] ?Phone: [phone]   Fax:  [phone] ? ?Physical Therapy Treatment ? ?Patient Details  ?Name: Mike Holloway ?MRN: [id number] ?Date of Birth: [birth year] ?Referring Provider (PT): Dr. Rudene Christians ? ? ?Encounter Date: 12/08/2021 ? ? PT End of Session - 12/08/21 1306   ? ? Visit Number [id number]   ? Number of Visits 151   ? Date for PT Re-Evaluation 01/12/22   ? Authorization - Visit Number 7   ? Authorization - Number of Visits 10   ? PT Start Time 0867   ? PT Stop Time 1355   ? PT Time Calculation (min) 58 min   ? Equipment Utilized During Treatment Gait belt   ? Activity Tolerance Patient tolerated treatment well;Patient limited by pain;Patient limited by fatigue   ? Behavior During Therapy [facility] for tasks assessed/performed   ? ?  ?  ? ?  ? ? ?Past Medical History:  ?Diagnosis Date  ? Arthritis   ? Atrial flutter ([place])   ? Diabetes mellitus ([place])   ? Essential tremor   ? Essential tremor   ? deep brain stimulator   ? Hypercholesteremia   ? Hypertension   ? Incontinence   ? Non-Hodgkin lymphoma ([place])   ? grew on the testical  ? SIADH (syndrome of inappropriate ADH production) ([place])   ? Sleep apnea   ? Stroke [facility])   ? ? ?Past Surgical History:  ?Procedure Laterality Date  ? ABLATION    ? APPENDECTOMY    ? CATARACT EXTRACTION, BILATERAL    ? COLONOSCOPY WITH PROPOFOL N/A 03/17/2018  ? Procedure: COLONOSCOPY WITH PROPOFOL;  Surgeon: Jonathon Bellows, MD;  Location: [facility] ENDOSCOPY;  Service: Gastroenterology;  Laterality: N/A;  ? DEEP BRAIN STIMULATOR PLACEMENT    ? 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 Procedure: FECAL TRANSPLANT;  Surgeon: Jonathon Bellows, MD;  Location: [facility] ENDOSCOPY;  Service: Gastroenterology;  Laterality: N/A;  ? HEMORRHOID SURGERY    ? HERNIA REPAIR    ? HIP FRACTURE SURGERY    ? INTRAMEDULLARY (IM) NAIL INTERTROCHANTERIC Right 09/16/2018  ? Procedure: INTRAMEDULLARY (IM) NAIL INTERTROCHANTRIC;  Surgeon: Hessie Knows, MD;  Location: [facility] ORS;  Service: Orthopedics;  Laterality: Right;  ? IR CATHETER TUBE CHANGE  11/22/2018  ? NASAL SINUS SURGERY    ? ORCHIECTOMY    ? TONSILLECTOMY    ? ? ?There were no vitals filed for this visit. ? ? Subjective Assessment - 12/08/21 1300   ? ? Subjective Pt. entered PT with no new complaints.  Pt. had an 81th birthday this past Saturday.  Pt. reports no LOB and did some walking/ stairs at son's house.   ? Patient is accompained by: Family member   ? Pertinent History Pt. states R knee is bothering him but it is the typical discomfort he experiences   ? Limitations House hold activities;Walking;Standing;Lifting   ? How long can you sit comfortably? 2 hours   ? How long can you stand comfortably? <10 min   ? How long can you walk comfortably? 10 min   ? Patient Stated Goals Improve LE strength/ gait and balance with daily tasks.  Improve gait/ safety/ progression to least assistive device.   ? Currently in Pain? Yes   ? Pain Score 1    ? Pain Location Knee   ? Pain Orientation Right   ? Pain Onset More than a month ago   ? Pain Onset More  than a month ago   ? ?  ?  ? ?  ? ? ?  ?Neuro Re-ED:  ?  ?Walking in //-bars:  6" hurdles forwards 3 laps/ lateral 2 laps.  Focus on hip flexion/ upright posture.  Heavy UE assist on //-bars required.   ?  ?Walking forward/ backwards in //-bars with upright posture and recip. Gait pattern 4 laps.  Pt. Reports increase in R knee/ low back discomfort.      ?  ?Standing wt. Shifting in //-bars with mirror feedback/ STS from gray chair and posture correction. ? ?Walking in hallway/ outside down ramp to car with use of RW.  Increase cadence due to colder weather. ?  ? ?Therex.  ?  ?Seated marching/LAQ/heel and toe raises with no ankle wts. today 2x10.      ?  ?Nustep L4 B LE only 12+ min., MHP provided to lumbar for comfort.  ? ?Reviewed HEP ?  ? ? ? PT Long Term Goals - 10/20/21 1700   ? ?  ? PT LONG TERM GOAL #1  ? Title Pt. will ambulate 10 minutes with use  of RW and SBA/mod. independence to improve walking endurance/ community ambulation.   ? Baseline Pt. ambulates around 5-6 mintues prior to fatigue/ increase knee/ back pain.   ? Time 12   ? Period Weeks   ? Status New   ? Target Date 01/12/22   ?  ? PT LONG TERM GOAL #2  ? Title Pt. will increase Berg balance test to >40 out of 56 to improve independence with gait/ decrease fall risk.   ? Baseline Berg: 22/56 (significant fall risk); 9/30 32/56.  11/10: 32 (limited by R knee pain)  2/9: 33/56 limited due to R knee and Low Back pain; 8/10: 29/56; 10/5: 38/56, 11/18/20: 28. 04/28/2021: 28/56 07/07/2021: 21/56.  11/29: 31/56 (limited by knee pain/ use of UE assist).   ? Time 12   ? Period Weeks   ? Status Partially Met   ? Target Date 01/12/22   ?  ? PT LONG TERM GOAL #3  ? Title Pt. able to ambulate 100 feet with consistent 2 point gait pattern and use of least restrictive assistive devce to improve household mobility.   ? Baseline Pt. able to ambulate with use of RW/ CGA to min. A for safety.   Heavy use of B UE.; 9/30 pt able to ambulate with rollator with inconsistent 2 point gait pattern, heavy reliance on UE CGA/supervision  2/9: Pt. uses 2 point step pattern with walking with rollader with decreased stride length. Pt. would be limited due to pain in the knee walking for >100 ft.; 8/10: Pt demonstrates ability to ambulate 100' with consistent 2 point gait pattern with rollator. 11/18/20: Pt able to amb 130 ft with Rollator and 2-point gait pattern. 04/28/21: Pt able to amb 148 ft with Rollator and 2-point gait pattern 07/07/2021 Pt was able to amb 300 ft with rollator and 2 point gat pattern. Walking rollator is the least restricitive AD as this time. PT required several standing rest breaks.   ? Time 8   ? Period Weeks   ? Status Achieved   ? Target Date 07/07/21   ?  ? PT LONG TERM GOAL #4  ? Title Pt. able to stand from normal chair with no UE assist to improve safety/independence with transfers.    ? Baseline  Unable to stand from standard chair without heavy UE assist. 10/6, pt unable to  stand from standard chair without UE support.  11/10: benefits from 1 UE assist    2/9: Pt. can rise from normal chair with 1 UE assist and must control descent with UE assist.; 8/10: Pt requires 2 UE for standing from standard chair height.; 10/5: Requires single UE to stand. 11/18/20: Pt requires B UE support.  04/28/21: Pt requires single UE support 07/07/2021: Pt continues to require R UE, unable to complete without UE when attempted.   ? Time 8   ? Period Weeks   ? Status Partially Met   ? Target Date 01/12/22   ?  ? PT LONG TERM GOAL #5  ? Title .   ?  ? PT LONG TERM GOAL #8  ? Title Pt will demonstrate ability to stand from a lowered surface below 18" with single Ue support to demonstrate improved BLE strength to improve transfers from toilet.   ? Baseline 10/5: Able to stand from 18" chair with single UE support with use of momentum. 11/18/20: able to after 3 attempts. 10/11: Able to complete after 2 attempts with single UE assist.   ? Time 12   ? Period Weeks   ? Status Partially Met   ? Target Date 01/12/22   ? ?  ?  ? ?  ? ? ? ? ? ? Plan - 12/08/21 1306   ? ? Clinical Impression Statement Pt. challenged with lateral walking and use of hurdles to encourage hip flexion/ abduction.  Pt. completed increase reps in //-bars during dynamic balance tasks.  No LOB and pt. always requires UE assist with standing/walking tasks secondary to R knee/ back discomfort.  Pt. showing improvement in overall standing/walking endurance during tx. session/ walking to car at end of tx. session.  PT issued an updated tx. schedule and pt. instructed to remain active with standing HEP/ walking around house.   ? Personal Factors and Comorbidities Age;Comorbidity 3+   ? Examination-Activity Limitations Toileting;Stand;Squat;Lift;Stairs   ? Stability/Clinical Decision Making Evolving/Moderate complexity   ? Clinical Decision Making Moderate   ? Rehab  Potential Fair   ? PT Frequency 1x / week   ? PT Duration 8 weeks   ? PT Treatment/Interventions Moist Heat;Functional mobility training;Therapeutic activities;Therapeutic exercise;Balance training;Patient/family educa

## 2021-12-13 NOTE — Progress Notes (Signed)
Cath Change/ Replacement ? ?Patient is present today for a catheter change due to urinary retention.  70m of water was removed from the balloon, a 16 FR foley cath was removed with out difficulty.  Patient was cleaned and prepped in a sterile fashion with betadine. A 16 FR foley cath was replaced into the bladder no complications were noted Urine return was noted 122mand urine was clear/yellow in color. The balloon was filled with 1062mf sterile water. A  bag was attached for drainage.  A night bag was also given to the patient and patient was given instruction on how to change from one bag to another. Patient was given proper instruction on catheter care.   ? ?Performed by: SHAZara CouncilA-C  ? ?Follow up: Return in about 1 month (around 01/14/2022) for Foley exchange . ? 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  Mrs. BorKeetchs also informed me that he has been having episodes of gross hematuria that occur once every few days.  There has been no clots and the catheter continues to drain.  I gave the options of having a repeat hematuria work up with us [place] referral back to Dr. PetTerance HartShe would like Dr. StoDene Gentryinion regarding the next steps.  ? ?I,Kailey Littlejohn,acting as a scrEducation administratorr SHAFederal-MogulA-C.,have documented all relevant documentation on the behalf of SHAP[place]A-C,as directed by  [facility] Hospital[facility]-C while in the presence of SHAP[place]A-C. ? ?I have reviewed the above documentation for accuracy and completeness, and I agree with the above.   ? ?ShaZara CouncilA-C  ? ?I spent 15 minutes on the day of the encounter to include pre-visit record review, face-to-face time with the patient, and post-visit ordering of tests.  ?

## 2021-12-14 ENCOUNTER — Encounter: Payer: Self-pay | Admitting: Urology

## 2021-12-14 ENCOUNTER — Other Ambulatory Visit: Payer: Self-pay

## 2021-12-14 ENCOUNTER — Ambulatory Visit (INDEPENDENT_AMBULATORY_CARE_PROVIDER_SITE_OTHER): Payer: Medicare Other | Admitting: Urology

## 2021-12-14 VITALS — Ht 68.0 in | Wt 228.0 lb

## 2021-12-14 DIAGNOSIS — R310 Gross hematuria: Secondary | ICD-10-CM | POA: Diagnosis not present

## 2021-12-14 DIAGNOSIS — Z978 Presence of other specified devices: Secondary | ICD-10-CM | POA: Diagnosis not present

## 2021-12-15 ENCOUNTER — Encounter: Payer: Medicare Other | Admitting: Physical Therapy

## 2021-12-16 ENCOUNTER — Ambulatory Visit: Payer: Medicare Other | Admitting: Physical Therapy

## 2021-12-16 ENCOUNTER — Encounter: Payer: Self-pay | Admitting: Physical Therapy

## 2021-12-16 ENCOUNTER — Other Ambulatory Visit: Payer: Self-pay

## 2021-12-16 DIAGNOSIS — R269 Unspecified abnormalities of gait and mobility: Secondary | ICD-10-CM

## 2021-12-16 DIAGNOSIS — R2689 Other abnormalities of gait and mobility: Secondary | ICD-10-CM

## 2021-12-16 DIAGNOSIS — M6281 Muscle weakness (generalized): Secondary | ICD-10-CM

## 2021-12-16 DIAGNOSIS — R293 Abnormal posture: Secondary | ICD-10-CM

## 2021-12-16 NOTE — Therapy (Signed)
[place] ?[facility] ?[address] [place], [place], [address] ?Phone: [phone]   Fax:  [phone] ? ?Physical Therapy Treatment ? ?Patient Details  ?Name: Mike Holloway ?MRN: [id number] ?Date of Birth: 08-25-1941 ?Referring Provider (PT): Dr. Rudene Christians ? ? ?Encounter Date: 12/16/2021 ? ? PT End of Session - 12/16/21 1302   ? ? Visit Number [id number]   ? Number of Visits 151   ? Date for PT Re-Evaluation 01/12/22   ? Authorization - Visit Number 8   ? Authorization - Number of Visits 10   ? PT Start Time 1251   ? PT Stop Time 1407   ? PT Time Calculation (min) 76 min   ? Equipment Utilized During Treatment Gait belt   ? Activity Tolerance Patient tolerated treatment well;Patient limited by pain;Patient limited by fatigue   ? Behavior During Therapy [facility] for tasks assessed/performed   ? ?  ?  ? ?  ? ? ?Past Medical History:  ?Diagnosis Date  ? Arthritis   ? Atrial flutter ([place])   ? Diabetes mellitus ([place])   ? Essential tremor   ? Essential tremor   ? deep brain stimulator   ? Hypercholesteremia   ? Hypertension   ? Incontinence   ? Non-Hodgkin lymphoma ([place])   ? grew on the testical  ? SIADH (syndrome of inappropriate ADH production) ([place])   ? Sleep apnea   ? Stroke [facility])   ? ? ?Past Surgical History:  ?Procedure Laterality Date  ? ABLATION    ? APPENDECTOMY    ? CATARACT EXTRACTION, BILATERAL    ? COLONOSCOPY WITH PROPOFOL N/A 03/17/2018  ? Procedure: COLONOSCOPY WITH PROPOFOL;  Surgeon: Jonathon Bellows, MD;  Location: [facility] ENDOSCOPY;  Service: Gastroenterology;  Laterality: N/A;  ? DEEP BRAIN STIMULATOR PLACEMENT    ? 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 Procedure: FECAL TRANSPLANT;  Surgeon: Jonathon Bellows, MD;  Location: [facility] ENDOSCOPY;  Service: Gastroenterology;  Laterality: N/A;  ? HEMORRHOID SURGERY    ? HERNIA REPAIR    ? HIP FRACTURE SURGERY    ? INTRAMEDULLARY (IM) NAIL INTERTROCHANTERIC Right 09/16/2018  ? Procedure: INTRAMEDULLARY (IM) NAIL INTERTROCHANTRIC;  Surgeon: Hessie Knows, MD;  Location: [facility] ORS;  Service: Orthopedics;  Laterality: Right;  ? IR CATHETER TUBE CHANGE  11/22/2018  ? NASAL SINUS SURGERY    ? ORCHIECTOMY    ? TONSILLECTOMY    ? ? ?There were no vitals filed for this visit. ? ? Subjective Assessment - 12/16/21 1412   ? ? Subjective Pt. entered PT with no new complaints.  Pt. ascending/ descending stairs at son's house with no assistance.   ? Patient is accompained by: Family member   ? Pertinent History Pt. states R knee is bothering him but it is the typical discomfort he experiences   ? Limitations House hold activities;Walking;Standing;Lifting   ? How long can you sit comfortably? 2 hours   ? How long can you stand comfortably? <10 min   ? How long can you walk comfortably? 10 min   ? Patient Stated Goals Improve LE strength/ gait and balance with daily tasks.  Improve gait/ safety/ progression to least assistive device.   ? Currently in Pain? Yes   ? Pain Score 1    ? Pain Location Knee   ? Pain Orientation Right   ? Pain Onset More than a month ago   ? Pain Onset More than a month ago   ? ?  ?  ? ?  ? ? ? ? ?  Neuro Re-ED:  ? ?Step to stair climbing 4 steps x 2 with B UE assist on handrails.  No increase knee pain.  Turning at stairs without use of rollator.  Controlled descent into chair with focus on quad control/ proper upright posture.  ?  ?Walking in //-bars: Airex step ups/  6" hurdles forwards 2 laps/ latera stepping (cuing to correct posture)l 2 laps.  Focus on hip flexion/ upright posture.  Heavy UE assist on //-bars required.   ?  ?Resisted gait walking forward/ backwards in //-bars with upright posture and recip. Gait pattern 4 laps each  Pt. Reports increase in R knee/ low back discomfort.      ?  ?Standing wt. Shifting in //-bars with mirror feedback (with and without Airex)/ STS from gray chair and posture correction. ?  ?Walking in hallway with use of RW.  Consistent cadence and cuing to correct posture/ step pattern at threshold changes.  No LOB.    ?  ?  ?Therex.  ?  ?Seated marching/LAQ/heel and toe raises with no ankle wts. today 2x10.      ?  ?Nustep L4 B LE only 12 min., MHP provided to lumbar for comfort.  ? ?Standing marching/ hip abduction 10x2 in //-bars.   ? ?Reviewed HEP/ discussed supine ex.  ?  ? ? ? PT Long Term Goals - 10/20/21 1700   ? ?  ? PT LONG TERM GOAL #1  ? Title Pt. will ambulate 10 minutes with use of RW and SBA/mod. independence to improve walking endurance/ community ambulation.   ? Baseline Pt. ambulates around 5-6 mintues prior to fatigue/ increase knee/ back pain.   ? Time 12   ? Period Weeks   ? Status New   ? Target Date 01/12/22   ?  ? PT LONG TERM GOAL #2  ? Title Pt. will increase Berg balance test to >40 out of 56 to improve independence with gait/ decrease fall risk.   ? Baseline Berg: 22/56 (significant fall risk); 9/30 32/56.  11/10: 32 (limited by R knee pain)  2/9: 33/56 limited due to R knee and Low Back pain; 8/10: 29/56; 10/5: 38/56, 11/18/20: 28. 04/28/2021: 28/56 07/07/2021: 21/56.  11/29: 31/56 (limited by knee pain/ use of UE assist).   ? Time 12   ? Period Weeks   ? Status Partially Met   ? Target Date 01/12/22   ?  ? PT LONG TERM GOAL #3  ? Title Pt. able to ambulate 100 feet with consistent 2 point gait pattern and use of least restrictive assistive devce to improve household mobility.   ? Baseline Pt. able to ambulate with use of RW/ CGA to min. A for safety.   Heavy use of B UE.; 9/30 pt able to ambulate with rollator with inconsistent 2 point gait pattern, heavy reliance on UE CGA/supervision  2/9: Pt. uses 2 point step pattern with walking with rollader with decreased stride length. Pt. would be limited due to pain in the knee walking for >100 ft.; 8/10: Pt demonstrates ability to ambulate 100' with consistent 2 point gait pattern with rollator. 11/18/20: Pt able to amb 130 ft with Rollator and 2-point gait pattern. 04/28/21: Pt able to amb 148 ft with Rollator and 2-point gait pattern 07/07/2021 Pt was able to  amb 300 ft with rollator and 2 point gat pattern. Walking rollator is the least restricitive AD as this time. PT required several standing rest breaks.   ? Time 8   ?  Period Weeks   ? Status Achieved   ? Target Date 07/07/21   ?  ? PT LONG TERM GOAL #4  ? Title Pt. able to stand from normal chair with no UE assist to improve safety/independence with transfers.    ? Baseline Unable to stand from standard chair without heavy UE assist. 10/6, pt unable to stand from standard chair without UE support.  11/10: benefits from 1 UE assist    2/9: Pt. can rise from normal chair with 1 UE assist and must control descent with UE assist.; 8/10: Pt requires 2 UE for standing from standard chair height.; 10/5: Requires single UE to stand. 11/18/20: Pt requires B UE support.  04/28/21: Pt requires single UE support 07/07/2021: Pt continues to require R UE, unable to complete without UE when attempted.   ? Time 8   ? Period Weeks   ? Status Partially Met   ? Target Date 01/12/22   ?  ? PT LONG TERM GOAL #5  ? Title .   ?  ? PT LONG TERM GOAL #8  ? Title Pt will demonstrate ability to stand from a lowered surface below 18" with single Ue support to demonstrate improved BLE strength to improve transfers from toilet.   ? Baseline 10/5: Able to stand from 18" chair with single UE support with use of momentum. 11/18/20: able to after 3 attempts. 10/11: Able to complete after 2 attempts with single UE assist.   ? Time 12   ? Period Weeks   ? Status Partially Met   ? Target Date 01/12/22   ? ?  ?  ? ?  ? ? ? ? ? ? ? ? Plan - 12/16/21 1408   ? ? Clinical Impression Statement Minimal c/o back/knee pain during tx. session with prolonged standing/ ther.ex.  Pt. completes resisted gait with good upright posture and minimal cuing for step pattern/ heel strike.  Pt. reports benefits from skilled PT services 1x/week to maintain functional gains/ ability to stand from chairs and ambulate/ climb stairs at home.  Pt. requires B UE assist with all  walking tasks secondary to back/knee issues.  No LOB and pt. challenged with Airex wt. shifting in //-bars.   ? Personal Factors and Comorbidities Age;Comorbidity 3+   ? Examination-Activity Limitations Toileti

## 2021-12-22 ENCOUNTER — Encounter: Payer: Medicare Other | Admitting: Physical Therapy

## 2021-12-23 ENCOUNTER — Other Ambulatory Visit: Payer: Self-pay | Admitting: Urology

## 2021-12-23 ENCOUNTER — Encounter: Payer: Self-pay | Admitting: Physical Therapy

## 2021-12-23 ENCOUNTER — Ambulatory Visit: Payer: Medicare Other | Admitting: Physical Therapy

## 2021-12-23 DIAGNOSIS — Z978 Presence of other specified devices: Secondary | ICD-10-CM

## 2021-12-23 DIAGNOSIS — R269 Unspecified abnormalities of gait and mobility: Secondary | ICD-10-CM

## 2021-12-23 DIAGNOSIS — R293 Abnormal posture: Secondary | ICD-10-CM

## 2021-12-23 DIAGNOSIS — R310 Gross hematuria: Secondary | ICD-10-CM

## 2021-12-23 DIAGNOSIS — N368 Other specified disorders of urethra: Secondary | ICD-10-CM

## 2021-12-23 DIAGNOSIS — R2689 Other abnormalities of gait and mobility: Secondary | ICD-10-CM

## 2021-12-23 DIAGNOSIS — M6281 Muscle weakness (generalized): Secondary | ICD-10-CM

## 2021-12-23 NOTE — Progress Notes (Unsigned)
Referral placed to [facility] urology with Dr. Terance Hart for SPT placement and gross hematuria.  ?

## 2021-12-23 NOTE — Therapy (Addendum)
?OUTPATIENT PHYSICAL THERAPY TREATMENT NOTE ? ? ?Patient Name: Mike Holloway ?MRN: [id number] ?DOB:03/11/1941, 81 y.o., male ?Today's Date: 12/23/2021 ? ?PCP: [name], MD ?REFERRING PROVIDER: Hessie Knows, MD ? ? PT End of Session - 12/23/21 1316   ? ? Visit Number [id number]   ? Number of Visits 151   ? Date for PT Re-Evaluation 01/12/22   ? Authorization - Visit Number 9   ? Authorization - Number of Visits 10   ? PT Start Time 3875   ? PT Stop Time 1408   ? PT Time Calculation (min) 63 min   ? Activity Tolerance Patient tolerated treatment well;Patient limited by pain;Patient limited by fatigue   ? Behavior During Therapy [facility] for tasks assessed/performed   ? ?  ?  ? ?  ? ? ? ? ?Past Medical History:  ?Diagnosis Date  ? Arthritis   ? Atrial flutter ([place])   ? Diabetes mellitus ([place])   ? Essential tremor   ? Essential tremor   ? deep brain stimulator   ? Hypercholesteremia   ? Hypertension   ? Incontinence   ? Non-Hodgkin lymphoma ([place])   ? grew on the testical  ? SIADH (syndrome of inappropriate ADH production) ([place])   ? Sleep apnea   ? Stroke [facility])   ? ?Past Surgical History:  ?Procedure Laterality Date  ? ABLATION    ? APPENDECTOMY    ? CATARACT EXTRACTION, BILATERAL    ? COLONOSCOPY WITH PROPOFOL N/A 03/17/2018  ? Procedure: COLONOSCOPY WITH PROPOFOL;  Surgeon: Jonathon Bellows, MD;  Location: [facility] ENDOSCOPY;  Service: Gastroenterology;  Laterality: N/A;  ? DEEP BRAIN STIMULATOR PLACEMENT    ? 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 Procedure: FECAL TRANSPLANT;  Surgeon: Jonathon Bellows, MD;  Location: [facility] ENDOSCOPY;  Service: Gastroenterology;  Laterality: N/A;  ? HEMORRHOID SURGERY    ? HERNIA REPAIR    ? HIP FRACTURE SURGERY    ? INTRAMEDULLARY (IM) NAIL INTERTROCHANTERIC Right 09/16/2018  ? Procedure: INTRAMEDULLARY (IM) NAIL INTERTROCHANTRIC;  Surgeon: Hessie Knows, MD;  Location: [facility] ORS;  Service: Orthopedics;  Laterality: Right;  ? IR CATHETER TUBE CHANGE  11/22/2018  ? NASAL SINUS SURGERY    ? ORCHIECTOMY     ? TONSILLECTOMY    ? ?Patient Active Problem List  ? Diagnosis Date Noted  ? DVT femoral (deep venous thrombosis) with thrombophlebitis, left ([place]) 02/06/2020  ? Lymphedema 04/15/2019  ? Bilateral leg edema 01/25/2019  ? Hyponatremia 12/24/2018  ? SIADH (syndrome of inappropriate ADH production) ([place]) 12/24/2018  ? Erosion of urethra due to catheterization of urinary tract ([place]) 10/27/2018  ? Generalized weakness 10/14/2018  ? Hip fracture ([place]) 09/15/2018  ? 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  ? 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 HLD (hyperlipidemia) 03/24/2018  ? Contusion of right knee 02/14/2018  ? Bradycardia 12/28/2017  ? 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 Tremor 09/04/2016  ? Chronic pain of right knee 08/10/2016  ? Right ankle pain 08/10/2016  ? 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  ? 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[place]) 12/11/2015  ? Urinary retention 09/08/2015  ? Lymphoma, 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[place]) 04/03/2015  ? Breathlessness on exertion 11/21/2014  ? Breath shortness 11/21/2014  ? Arthropathy 11/07/2014  ? Atrial flutter, paroxysmal ([place]) 11/07/2014  ? Type 2 diabetes mellitus ([place]) 11/07/2014  ? Benign essential tremor 11/07/2014  ? Benign essential HTN 11/07/2014  ? Mixed incontinence 11/07/2014  ? Hypercholesterolemia without hypertriglyceridemia 11/07/2014  ? Apnea, sleep 11/07/2014  ?  Controlled type 2 diabetes mellitus without complication (HCC) 11/07/2014  ? Pure hypercholesterolemia 11/07/2014  ? Other abnormalities of gait and mobility 11/02/2011  ? Decreased mobility 11/02/2011  ? Abnormal gait 06/29/2011  ? Discoordination 06/29/2011  ? ? ?REFERRING DIAG: S/p R subtrochanteric hip fracture, S/p R ORIF fracture of hip.   ? ?THERAPY DIAG:  ?Muscle weakness (generalized) ? ?Gait difficulty ? ?Balance problem ? ?Abnormal posture ? ?PERTINENT HISTORY: See evaluation ? ?PRECAUTIONS: Fall ? ?SUBJECTIVE: Pt. Reports  no new complaints.  Minimal R knee/ low back discomfort walking into PT gym today with rollator.  No LOB or falls reported.     ? ?PAIN:  ?Are you having pain? Yes: Pain location: R knee ?Pain description: achy ?Aggravating factors: increase walking/ stairs ?Relieving factors: sitting/ rest ? ? ? ?TODAY'S TREATMENT:  ? ?Therex.  ?  ?Seated marching/LAQ/heel and toe raises with no ankle wts. today 2x10.      ? ?Standing marching/ hamstring curls/ hip abduction/ heel and toe raises 20x.  Lateral walking at //-bars with mirror feedback (SBA/CGA for safety and verbal cuing for posture correction).   ?  ?Nustep L4 B LE only 12 min., MHP provided to lumbar for comfort.  ?  ? ?Neuro Re-ED:  ?  ?Standing 2nd step touches on L/R with B UE assist 20x.  SBA/CGA for safety ? ?Step to stair climbing 4 steps x 2 with B UE assist on handrails.  No increase knee pain.  Turning at stairs without use of rollator.  Controlled descent into chair with focus on quad control/ proper upright posture.  ?  ?Walking in //-bars:  hip marching forward/ backwards 4 laps. ?  ?Resisted gait walking forward/ backwards in //-bars with upright posture and recip. Gait pattern 4 laps each  Pt. Reports increase in R knee/ low back discomfort.      ?  ?Standing wt. Shifting in //-bars with mirror feedback (with and without Airex)/ STS from gray chair and posture correction. ?  ?Walking in hallway with use of RW.  Consistent cadence and cuing to correct posture/ step pattern at threshold changes and down outside ramp.  No LOB.   ?  ?  ?  ? ? ?PATIENT EDUCATION: ?Education details: Posture correction with standing ex. ?Person educated: Patient ?Education method: Explanation and Demonstration ?Education comprehension: verbalized understanding and returned demonstration ? ? ?HOME EXERCISE PROGRAM: ?Access code: [email or web address] ? ?Pt. Has a progressive standing ex. Program.  Scheduled daily walks with rollator at home.  Increase distances with son or family outside.    ? ? ? ? PT Long Term Goals -   ? ?  ? PT LONG TERM GOAL #1  ? Title Pt. will ambulate 10 minutes with use of RW and SBA/mod. independence to improve walking endurance/ community ambulation.   ? Baseline Pt. ambulates around 5-6 mintues prior to fatigue/ increase knee/ back pain.   ? Time 12   ? Period Weeks   ? Status New   ? Target Date 01/12/22   ?  ? PT LONG TERM GOAL #2  ? Title Pt. will increase Berg balance test to >40 out of 56 to improve independence with gait/ decrease fall risk.   ? Baseline Berg: 22/56 (significant fall risk); 9/30 32/56.  11/10: 32 (limited by R knee pain)  2/9: 33/56 limited due to R knee and Low Back pain; 8/10: 29/56; 10/5: 38/56, 11/18/20: 28. 04/28/2021: 28/56 07/07/2021: 21/56.  11/29: 31/56 (limited by knee pain/ use of UE   assist).   ? Time 12   ? Period Weeks   ? Status Partially Met   ? Target Date 01/12/22   ?  ? PT LONG TERM GOAL #3  ? Title Pt. able to ambulate 100 feet with consistent 2 point gait pattern and use of least restrictive assistive devce to improve household mobility.   ? Baseline Pt. able to ambulate with use of RW/ CGA to min. A for safety.   Heavy use of B UE.; 9/30 pt able to ambulate with rollator with inconsistent 2 point gait pattern, heavy reliance on UE CGA/supervision  2/9: Pt. uses 2 point step pattern with walking with rollader with decreased stride length. Pt. would be limited due to pain in the knee walking for >100 ft.; 8/10: Pt demonstrates ability to ambulate 100' with consistent 2 point gait pattern with rollator. 11/18/20: Pt able to amb 130 ft with Rollator and 2-point gait pattern. 04/28/21: Pt able to amb 148 ft with Rollator and 2-point gait pattern 07/07/2021 Pt was able to amb 300 ft with rollator and 2 point gat pattern. Walking rollator is the least restricitive AD as this time. PT required several standing rest breaks.   ? Time 8   ? Period Weeks   ? Status Achieved   ? Target Date 07/07/21   ?  ? PT LONG TERM GOAL #4  ? Title Pt. able to  stand from normal chair with no UE assist to improve safety/independence with transfers.    ? Baseline Unable to stand from standard chair without heavy UE assist. 10/6, pt unable to stand from standar

## 2021-12-28 ENCOUNTER — Ambulatory Visit: Payer: Medicare Other | Attending: Orthopedic Surgery | Admitting: Physical Therapy

## 2021-12-28 DIAGNOSIS — R269 Unspecified abnormalities of gait and mobility: Secondary | ICD-10-CM | POA: Insufficient documentation

## 2021-12-28 DIAGNOSIS — R2689 Other abnormalities of gait and mobility: Secondary | ICD-10-CM | POA: Insufficient documentation

## 2021-12-28 DIAGNOSIS — M6281 Muscle weakness (generalized): Secondary | ICD-10-CM | POA: Insufficient documentation

## 2021-12-28 DIAGNOSIS — R293 Abnormal posture: Secondary | ICD-10-CM | POA: Diagnosis present

## 2021-12-29 NOTE — Therapy (Signed)
?OUTPATIENT PHYSICAL THERAPY TREATMENT NOTE ?Physical Therapy Progress Note ? ? ?Dates of reporting period 10/26/21 to 12/28/21  ? ? ?Patient Name: Mike Holloway ?MRN: [id number] ?DOB:[birth year], 81 y.o., male ?Today's Date: 12/23/2021 ? ?PCP: [name], MD ?REFERRING PROVIDER: Hessie Knows, MD ? ? PT End of Session - 12/29/21 1957   ? ? Visit Number [id number]   ? Number of Visits 151   ? Date for PT Re-Evaluation 01/12/22   ? Authorization - Visit Number 10   ? Authorization - Number of Visits 10   ? PT Start Time 1318   ? PT Stop Time 1410   ? PT Time Calculation (min) 52 min   ? Activity Tolerance Patient tolerated treatment well;Patient limited by pain;Patient limited by fatigue   ? Behavior During Therapy [facility] for tasks assessed/performed   ? ?  ?  ? ?  ? ? ? ? ?Past Medical History:  ?Diagnosis Date  ? Arthritis   ? Atrial flutter ([place])   ? Diabetes mellitus ([place])   ? Essential tremor   ? Essential tremor   ? deep brain stimulator   ? Hypercholesteremia   ? Hypertension   ? Incontinence   ? Non-Hodgkin lymphoma ([place])   ? grew on the testical  ? SIADH (syndrome of inappropriate ADH production) ([place])   ? Sleep apnea   ? Stroke [facility])   ? ?Past Surgical History:  ?Procedure Laterality Date  ? ABLATION    ? APPENDECTOMY    ? CATARACT EXTRACTION, BILATERAL    ? COLONOSCOPY WITH PROPOFOL N/A 03/17/2018  ? Procedure: COLONOSCOPY WITH PROPOFOL;  Surgeon: Jonathon Bellows, MD;  Location: [facility] ENDOSCOPY;  Service: Gastroenterology;  Laterality: N/A;  ? DEEP BRAIN STIMULATOR PLACEMENT    ? 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 Procedure: FECAL TRANSPLANT;  Surgeon: Jonathon Bellows, MD;  Location: [facility] ENDOSCOPY;  Service: Gastroenterology;  Laterality: N/A;  ? HEMORRHOID SURGERY    ? HERNIA REPAIR    ? HIP FRACTURE SURGERY    ? INTRAMEDULLARY (IM) NAIL INTERTROCHANTERIC Right 09/16/2018  ? Procedure: INTRAMEDULLARY (IM) NAIL INTERTROCHANTRIC;  Surgeon: Hessie Knows, MD;  Location: [facility] ORS;  Service: Orthopedics;  Laterality:  Right;  ? IR CATHETER TUBE CHANGE  11/22/2018  ? NASAL SINUS SURGERY    ? ORCHIECTOMY    ? TONSILLECTOMY    ? ?Patient Active Problem List  ? Diagnosis Date Noted  ? DVT femoral (deep venous thrombosis) with thrombophlebitis, left ([place]) 02/06/2020  ? Lymphedema 04/15/2019  ? Bilateral leg edema 01/25/2019  ? Hyponatremia 12/24/2018  ? SIADH (syndrome of inappropriate ADH production) ([place]) 12/24/2018  ? Erosion of urethra due to catheterization of urinary tract ([place]) 10/27/2018  ? Generalized weakness 10/14/2018  ? Hip fracture ([place]) 09/15/2018  ? 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  ? 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 HLD (hyperlipidemia) 03/24/2018  ? Contusion of right knee 02/14/2018  ? Bradycardia 12/28/2017  ? 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 Tremor 09/04/2016  ? Chronic pain of right knee 08/10/2016  ? Right ankle pain 08/10/2016  ? 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  ? 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[place]) 12/11/2015  ? Urinary retention 09/08/2015  ? Lymphoma, 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[place]) 04/03/2015  ? Breathlessness on exertion 11/21/2014  ? Breath shortness 11/21/2014  ? Arthropathy 11/07/2014  ? Atrial flutter, paroxysmal ([place]) 11/07/2014  ? Type 2 diabetes mellitus ([place]) 11/07/2014  ? Benign essential tremor 11/07/2014  ? Benign essential HTN 11/07/2014  ?  Mixed incontinence 11/07/2014  ? Hypercholesterolemia without hypertriglyceridemia 11/07/2014  ? Apnea, sleep 11/07/2014  ? Controlled type 2 diabetes mellitus without complication ([place]) 37/85/8850  ? Pure hypercholesterolemia 11/07/2014  ? Other abnormalities of gait and mobility 11/02/2011  ? Decreased mobility 11/02/2011  ? Abnormal gait 06/29/2011  ? Discoordination 06/29/2011  ? ? ?REFERRING DIAG: S/p R subtrochanteric hip fracture, S/p R ORIF fracture of hip.   ? ?THERAPY DIAG:  ?Muscle weakness (generalized) ? ?Gait difficulty ? ?Balance problem ? ?Abnormal  posture ? ?PERTINENT HISTORY: See evaluation ? ?PRECAUTIONS: Fall ? ?SUBJECTIVE:  Pt. reports having a good weekend. Pts. wife reports that pt. was able to get out of passenger side of car/ stand upright without assistance at church. ? ?PAIN:  ?Are you having pain? Yes: 1/10. Pain location: R knee ?Pain description: achy ?Aggravating factors: increase walking/ stairs ?Relieving factors: sitting/ rest ? ? ? ?TODAY'S TREATMENT:  ? ?Therex.  ?  ?Seated LE ex. (5# ankle wts.): marching/LAQ/heel and toe raises 10x2.  Walking in //-bars with increase hip flexion/ step pattern (forward/ lateral)- 2 laps.  ? ?Backwards walking in //-bars 2 laps (cuing for upright posture/ mirror feedback).   ? ?Seated B shoulder flexion/ abduction 10x2 each.  Seated upright posture correction.  Hypervolt to B UT/ upper thoracic paraspinals.   ?  ?Nustep L4 B LE only 12 min., MHP provided to lumbar for comfort.  ?  ? ?Neuro Re-ED:  ?  ?Walking in hallway/ clinic with use of RW working on consistent step pattern/ BOS.  ? ?Step to stair climbing 4 steps x 2 with B UE assist on handrails.  No increase knee pain.  Turning at stairs without use of rollator.  Controlled descent into chair with focus on quad control/ proper upright posture.   ?  ?Standing wt. Shifting in //-bars with mirror feedback.  Sit to stand/ transfers from gray chair 3x.   ?  ?Walking in hallway with use of RW.  Consistent cadence and cuing to correct posture/ step pattern at threshold changes and down outside ramp.  No LOB.   ?  ?  ?  ? ? ?PATIENT EDUCATION: ?Education details: Posture correction with standing ex. ?Person educated: Patient ?Education method: Explanation and Demonstration ?Education comprehension: verbalized understanding and returned demonstration ? ? ?HOME EXERCISE PROGRAM: ?Access code: [email or web address] ? ?Pt. Has a progressive standing ex. Program.  Scheduled daily walks with rollator at home.  Increase distances with son or family outside.   ? ? ? ? PT Long  Term Goals -   ? ?  ? PT LONG TERM GOAL #1  ? Title Pt. will ambulate 10 minutes with use of RW and SBA/mod. independence to improve walking endurance/ community ambulation.   ? Baseline Pt. ambulates around 5-6 mintues prior to fatigue/ increase knee/ back pain.   ? Time 12   ? Period Weeks   ? Status Ongoing  ? Target Date 01/12/22   ?  ? PT LONG TERM GOAL #2  ? Title Pt. will increase Berg balance test to >40 out of 56 to improve independence with gait/ decrease fall risk.   ? Baseline Berg: 22/56 (significant fall risk); 9/30 32/56.  11/10: 32 (limited by R knee pain)  2/9: 33/56 limited due to R knee and Low Back pain; 8/10: 29/56; 10/5: 38/56, 11/18/20: 28. 04/28/2021: 28/56 07/07/2021: 21/56.  11/29: 31/56 (limited by knee pain/ use of UE assist).   ? Time 12   ? Period Weeks   ?  Status Partially Met   ? Target Date 01/12/22   ?  ? PT LONG TERM GOAL #3  ? Title Pt. able to ambulate 100 feet with consistent 2 point gait pattern and use of least restrictive assistive devce to improve household mobility.   ? Baseline Pt. able to ambulate with use of RW/ CGA to min. A for safety.   Heavy use of B UE.; 9/30 pt able to ambulate with rollator with inconsistent 2 point gait pattern, heavy reliance on UE CGA/supervision  2/9: Pt. uses 2 point step pattern with walking with rollader with decreased stride length. Pt. would be limited due to pain in the knee walking for >100 ft.; 8/10: Pt demonstrates ability to ambulate 100' with consistent 2 point gait pattern with rollator. 11/18/20: Pt able to amb 130 ft with Rollator and 2-point gait pattern. 04/28/21: Pt able to amb 148 ft with Rollator and 2-point gait pattern 07/07/2021 Pt was able to amb 300 ft with rollator and 2 point gat pattern. Walking rollator is the least restricitive AD as this time. PT required several standing rest breaks.   ? Time 8   ? Period Weeks   ? Status Achieved   ? Target Date 07/07/21   ?  ? PT LONG TERM GOAL #4  ? Title Pt. able to stand from  normal chair with no UE assist to improve safety/independence with transfers.    ? Baseline Unable to stand from standard chair without heavy UE assist. 10/6, pt unable to stand from standard chair without UE support.

## 2022-01-04 ENCOUNTER — Encounter: Payer: Self-pay | Admitting: Physical Therapy

## 2022-01-04 ENCOUNTER — Ambulatory Visit: Payer: Medicare Other | Admitting: Physical Therapy

## 2022-01-04 DIAGNOSIS — M6281 Muscle weakness (generalized): Secondary | ICD-10-CM

## 2022-01-04 DIAGNOSIS — R269 Unspecified abnormalities of gait and mobility: Secondary | ICD-10-CM

## 2022-01-04 DIAGNOSIS — R2689 Other abnormalities of gait and mobility: Secondary | ICD-10-CM

## 2022-01-04 DIAGNOSIS — R293 Abnormal posture: Secondary | ICD-10-CM

## 2022-01-04 NOTE — Therapy (Signed)
?OUTPATIENT PHYSICAL THERAPY TREATMENT NOTE ?Physical Therapy Progress Note ? ? ?Dates of reporting period 10/26/21 to 12/28/21  ? ? ?Patient Name: Stoney Karczewski ?MRN: [id number] ?DOB:1941-02-15, 81 y.o., male ?Today's Date: 12/23/2021 ? ?PCP: [name], MD ?REFERRING PROVIDER: Hessie Knows, MD ? ? PT End of Session - 01/04/22 2006   ? ? Visit Number 150   ? Number of Visits 151   ? Date for PT Re-Evaluation 01/12/22   ? Authorization - Visit Number 1   ? Authorization - Number of Visits 10   ? PT Start Time 1301   ? PT Stop Time 7616   ? PT Time Calculation (min) 62 min   ? Activity Tolerance Patient tolerated treatment well;Patient limited by pain;Patient limited by fatigue   ? Behavior During Therapy [facility] for tasks assessed/performed   ? ?  ?  ? ?  ? ? ? ? ?Past Medical History:  ?Diagnosis Date  ? Arthritis   ? Atrial flutter ([place])   ? Diabetes mellitus ([place])   ? Essential tremor   ? Essential tremor   ? deep brain stimulator   ? Hypercholesteremia   ? Hypertension   ? Incontinence   ? Non-Hodgkin lymphoma ([place])   ? grew on the testical  ? SIADH (syndrome of inappropriate ADH production) ([place])   ? Sleep apnea   ? Stroke [facility])   ? ?Past Surgical History:  ?Procedure Laterality Date  ? ABLATION    ? APPENDECTOMY    ? CATARACT EXTRACTION, BILATERAL    ? COLONOSCOPY WITH PROPOFOL N/A 03/17/2018  ? Procedure: COLONOSCOPY WITH PROPOFOL;  Surgeon: Jonathon Bellows, MD;  Location: [facility] ENDOSCOPY;  Service: Gastroenterology;  Laterality: N/A;  ? DEEP BRAIN STIMULATOR PLACEMENT    ? 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 Procedure: FECAL TRANSPLANT;  Surgeon: Jonathon Bellows, MD;  Location: [facility] ENDOSCOPY;  Service: Gastroenterology;  Laterality: N/A;  ? HEMORRHOID SURGERY    ? HERNIA REPAIR    ? HIP FRACTURE SURGERY    ? INTRAMEDULLARY (IM) NAIL INTERTROCHANTERIC Right 09/16/2018  ? Procedure: INTRAMEDULLARY (IM) NAIL INTERTROCHANTRIC;  Surgeon: Hessie Knows, MD;  Location: [facility] ORS;  Service: Orthopedics;  Laterality: Right;   ? IR CATHETER TUBE CHANGE  11/22/2018  ? NASAL SINUS SURGERY    ? ORCHIECTOMY    ? TONSILLECTOMY    ? ?Patient Active Problem List  ? Diagnosis Date Noted  ? DVT femoral (deep venous thrombosis) with thrombophlebitis, left ([place]) 02/06/2020  ? Lymphedema 04/15/2019  ? Bilateral leg edema 01/25/2019  ? Hyponatremia 12/24/2018  ? SIADH (syndrome of inappropriate ADH production) ([place]) 12/24/2018  ? Erosion of urethra due to catheterization of urinary tract ([place]) 10/27/2018  ? Generalized weakness 10/14/2018  ? Hip fracture ([place]) 09/15/2018  ? 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  ? 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 HLD (hyperlipidemia) 03/24/2018  ? Contusion of right knee 02/14/2018  ? Bradycardia 12/28/2017  ? 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 Tremor 09/04/2016  ? Chronic pain of right knee 08/10/2016  ? Right ankle pain 08/10/2016  ? 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  ? 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[place]) 12/11/2015  ? Urinary retention 09/08/2015  ? Lymphoma, 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[place]) 04/03/2015  ? Breathlessness on exertion 11/21/2014  ? Breath shortness 11/21/2014  ? Arthropathy 11/07/2014  ? Atrial flutter, paroxysmal ([place]) 11/07/2014  ? Type 2 diabetes mellitus ([place]) 11/07/2014  ? Benign essential tremor 11/07/2014  ? Benign essential HTN 11/07/2014  ?  Mixed incontinence 11/07/2014  ? Hypercholesterolemia without hypertriglyceridemia 11/07/2014  ? Apnea, sleep 11/07/2014  ? Controlled type 2 diabetes mellitus without complication ([place]) 81/82/9937  ? Pure hypercholesterolemia 11/07/2014  ? Other abnormalities of gait and mobility 11/02/2011  ? Decreased mobility 11/02/2011  ? Abnormal gait 06/29/2011  ? Discoordination 06/29/2011  ? ? ?REFERRING DIAG: S/p R subtrochanteric hip fracture, S/p R ORIF fracture of hip.   ? ?THERAPY DIAG:  ?Muscle weakness (generalized) ? ?Gait difficulty ? ?Balance problem ? ?Abnormal  posture ? ?PERTINENT HISTORY: See evaluation ? ?PRECAUTIONS: Fall ? ?SUBJECTIVE:  No new complaints.  Pt. Not too active over weekend secondary to guests and rainy weather.  No back pain reported prior to PT tx. Session.   ? ?PAIN:  ?Are you having pain? Yes: 1/10. Pain location: R knee ?Pain description: achy ?Aggravating factors: increase walking/ stairs ?Relieving factors: sitting/ rest ? ? ? ?TODAY'S TREATMENT:  ? ?Therex.  ?  ?Seated LE ex.: marching/LAQ/heel and toe raises 10x2.  Walking in //-bars with increase hip flexion/ step pattern (forward/ lateral)- 2 laps.  Seated GTB hip abduction/ marching 20x (mirror feedback for posture correction).   ? ?Step ups/ downs with L LE and B UE assist at stairs 10x.   ?  ?Nustep L4 B LE only 12+ min., MHP provided to lumbar for comfort.  ?  ? ?Neuro Re-ED:  ?  ?Walking in hallway/ clinic with use of RW working on consistent step pattern/ BOS.  ? ?Step to stair climbing 4 steps x 2 with B UE assist on handrails.  No increase knee pain.  Turning at stairs without use of rollator.  Controlled descent into chair with focus on quad control/ proper upright posture.   ?  ?Sit to stand from gray chair 5x.   ? ?Walking cone taps (alternating) in //-bars (2 laps).   ?  ?Walking in hallway with use of RW.  Consistent cadence and cuing to correct posture/ step pattern at threshold changes and down outside ramp.  No LOB.   ?  ?  ?  ? ? ?PATIENT EDUCATION: ?Education details: Posture correction with standing ex. ?Person educated: Patient ?Education method: Explanation and Demonstration ?Education comprehension: verbalized understanding and returned demonstration ? ? ?HOME EXERCISE PROGRAM: ?Access code: [email or web address] ? ?Pt. Has a progressive standing ex. Program.  Scheduled daily walks with rollator at home.  Increase distances with son or family outside.   ? ? ? ? PT Long Term Goals -   ? ?  ? PT LONG TERM GOAL #1  ? Title Pt. will ambulate 10 minutes with use of RW and SBA/mod.  independence to improve walking endurance/ community ambulation.   ? Baseline Pt. ambulates around 5-6 mintues prior to fatigue/ increase knee/ back pain.   ? Time 12   ? Period Weeks   ? Status Ongoing  ? Target Date 01/12/22   ?  ? PT LONG TERM GOAL #2  ? Title Pt. will increase Berg balance test to >40 out of 56 to improve independence with gait/ decrease fall risk.   ? Baseline Berg: 22/56 (significant fall risk); 9/30 32/56.  11/10: 32 (limited by R knee pain)  2/9: 33/56 limited due to R knee and Low Back pain; 8/10: 29/56; 10/5: 38/56, 11/18/20: 28. 04/28/2021: 28/56 07/07/2021: 21/56.  11/29: 31/56 (limited by knee pain/ use of UE assist).   ? Time 12   ? Period Weeks   ? Status Partially Met   ? Target Date 01/12/22   ?  ?  PT LONG TERM GOAL #3  ? Title Pt. able to ambulate 100 feet with consistent 2 point gait pattern and use of least restrictive assistive devce to improve household mobility.   ? Baseline Pt. able to ambulate with use of RW/ CGA to min. A for safety.   Heavy use of B UE.; 9/30 pt able to ambulate with rollator with inconsistent 2 point gait pattern, heavy reliance on UE CGA/supervision  2/9: Pt. uses 2 point step pattern with walking with rollader with decreased stride length. Pt. would be limited due to pain in the knee walking for >100 ft.; 8/10: Pt demonstrates ability to ambulate 100' with consistent 2 point gait pattern with rollator. 11/18/20: Pt able to amb 130 ft with Rollator and 2-point gait pattern. 04/28/21: Pt able to amb 148 ft with Rollator and 2-point gait pattern 07/07/2021 Pt was able to amb 300 ft with rollator and 2 point gat pattern. Walking rollator is the least restricitive AD as this time. PT required several standing rest breaks.   ? Time 8   ? Period Weeks   ? Status Achieved   ? Target Date 07/07/21   ?  ? PT LONG TERM GOAL #4  ? Title Pt. able to stand from normal chair with no UE assist to improve safety/independence with transfers.    ? Baseline Unable to stand from  standard chair without heavy UE assist. 10/6, pt unable to stand from standard chair without UE support.  11/10: benefits from 1 UE assist    2/9: Pt. can rise from normal chair with 1 UE assist and must control descent w

## 2022-01-11 ENCOUNTER — Ambulatory Visit: Payer: Medicare Other | Admitting: Urology

## 2022-01-11 ENCOUNTER — Encounter: Payer: Medicare Other | Admitting: Physical Therapy

## 2022-01-12 ENCOUNTER — Ambulatory Visit (INDEPENDENT_AMBULATORY_CARE_PROVIDER_SITE_OTHER): Payer: Medicare Other | Admitting: Physician Assistant

## 2022-01-12 DIAGNOSIS — R339 Retention of urine, unspecified: Secondary | ICD-10-CM

## 2022-01-12 DIAGNOSIS — Z466 Encounter for fitting and adjustment of urinary device: Secondary | ICD-10-CM

## 2022-01-12 DIAGNOSIS — Z978 Presence of other specified devices: Secondary | ICD-10-CM

## 2022-01-12 NOTE — Progress Notes (Signed)
Cath Change/ Replacement ? ?Patient is present today for a catheter change due to urinary retention.  76m of water was removed from the balloon, a 16FR foley cath was removed without difficulty.  Patient was cleaned and prepped in a sterile fashion with betadine and 2% lidocaine jelly was instilled into the urethra. A 16 FR coude foley cath was replaced into the bladder no complications were noted Urine return was noted 565mand urine was yellow in color. The balloon was filled with 105mf sterile water. A leg bag was attached for drainage.  A night bag was also given to the patient. Patient tolerated well. ? ?Performed by: SamDebroah LoopA-C  ? ?Follow up: Return in about 4 weeks (around 02/09/2022) for Catheter exchange.   ?

## 2022-01-13 ENCOUNTER — Encounter: Payer: Self-pay | Admitting: Physical Therapy

## 2022-01-13 ENCOUNTER — Ambulatory Visit: Payer: Medicare Other | Admitting: Physical Therapy

## 2022-01-13 DIAGNOSIS — R2689 Other abnormalities of gait and mobility: Secondary | ICD-10-CM

## 2022-01-13 DIAGNOSIS — R269 Unspecified abnormalities of gait and mobility: Secondary | ICD-10-CM

## 2022-01-13 DIAGNOSIS — M6281 Muscle weakness (generalized): Secondary | ICD-10-CM | POA: Diagnosis not present

## 2022-01-13 DIAGNOSIS — R293 Abnormal posture: Secondary | ICD-10-CM

## 2022-01-14 NOTE — Therapy (Addendum)
?OUTPATIENT PHYSICAL THERAPY TREATMENT NOTE/RECERTIFICATION ?  ? ? ?Patient Name: Mike Holloway ?MRN: [id number] ?DOB:11-Aug-1941, 81 y.o., male ?Today's Date: 12/23/2021 ? ?PCP: [name], MD ?REFERRING PROVIDER: Hessie Knows, MD ? ? PT End of Session - 01/14/22 1303   ? ? Visit Number 1   ? Number of Visits 12   ? Date for PT Re-Evaluation 04/07/22   ? Authorization - Visit Number 1   ? Authorization - Number of Visits 10   ? PT Start Time 2979   ? PT Stop Time 8921   ? PT Time Calculation (min) 75 min   ? Activity Tolerance Patient tolerated treatment well;Patient limited by pain;Patient limited by fatigue   ? Behavior During Therapy [facility] for tasks assessed/performed   ? ?  ?  ? ?  ? ? ? ? ?Past Medical History:  ?Diagnosis Date  ? Arthritis   ? Atrial flutter ([place])   ? Diabetes mellitus ([place])   ? Essential tremor   ? Essential tremor   ? deep brain stimulator   ? Hypercholesteremia   ? Hypertension   ? Incontinence   ? Non-Hodgkin lymphoma ([place])   ? grew on the testical  ? SIADH (syndrome of inappropriate ADH production) ([place])   ? Sleep apnea   ? Stroke [facility]   ? ?Past Surgical History:  ?Procedure Laterality Date  ? ABLATION    ? APPENDECTOMY    ? CATARACT EXTRACTION, BILATERAL    ? COLONOSCOPY WITH PROPOFOL N/A 03/17/2018  ? Procedure: COLONOSCOPY WITH PROPOFOL;  Surgeon: Jonathon Bellows, MD;  Location: [facility] ENDOSCOPY;  Service: Gastroenterology;  Laterality: N/A;  ? DEEP BRAIN STIMULATOR PLACEMENT    ? 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 Procedure: FECAL TRANSPLANT;  Surgeon: Jonathon Bellows, MD;  Location: [facility] ENDOSCOPY;  Service: Gastroenterology;  Laterality: N/A;  ? HEMORRHOID SURGERY    ? HERNIA REPAIR    ? HIP FRACTURE SURGERY    ? INTRAMEDULLARY (IM) NAIL INTERTROCHANTERIC Right 09/16/2018  ? Procedure: INTRAMEDULLARY (IM) NAIL INTERTROCHANTRIC;  Surgeon: Hessie Knows, MD;  Location: [facility] ORS;  Service: Orthopedics;  Laterality: Right;  ? IR CATHETER TUBE CHANGE  11/22/2018  ? NASAL SINUS SURGERY    ?  ORCHIECTOMY    ? TONSILLECTOMY    ? ?Patient Active Problem List  ? Diagnosis Date Noted  ? DVT femoral (deep venous thrombosis) with thrombophlebitis, left ([place]) 02/06/2020  ? Lymphedema 04/15/2019  ? Bilateral leg edema 01/25/2019  ? Hyponatremia 12/24/2018  ? SIADH (syndrome of inappropriate ADH production) ([place]) 12/24/2018  ? Erosion of urethra due to catheterization of urinary tract ([place]) 10/27/2018  ? Generalized weakness 10/14/2018  ? Hip fracture ([place]) 09/15/2018  ? 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  ? 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 HLD (hyperlipidemia) 03/24/2018  ? Contusion of right knee 02/14/2018  ? Bradycardia 12/28/2017  ? 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 Tremor 09/04/2016  ? Chronic pain of right knee 08/10/2016  ? Right ankle pain 08/10/2016  ? 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  ? 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[place]) 12/11/2015  ? Urinary retention 09/08/2015  ? Lymphoma, 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[place]) 04/03/2015  ? Breathlessness on exertion 11/21/2014  ? Breath shortness 11/21/2014  ? Arthropathy 11/07/2014  ? Atrial flutter, paroxysmal ([place]) 11/07/2014  ? Type 2 diabetes mellitus ([place]) 11/07/2014  ? Benign essential tremor 11/07/2014  ? Benign essential HTN 11/07/2014  ? Mixed incontinence 11/07/2014  ? Hypercholesterolemia without hypertriglyceridemia 11/07/2014  ? Apnea,  sleep 11/07/2014  ? Controlled type 2 diabetes mellitus without complication ([place]) 97/10/6376  ? Pure hypercholesterolemia 11/07/2014  ? Other abnormalities of gait and mobility 11/02/2011  ? Decreased mobility 11/02/2011  ? Abnormal gait 06/29/2011  ? Discoordination 06/29/2011  ? ? ?REFERRING DIAG: S/p R subtrochanteric hip fracture, S/p R ORIF fracture of hip.   ? ?THERAPY DIAG:  ?Muscle weakness (generalized) ? ?Gait difficulty ? ?Balance problem ? ?Abnormal posture ? ?PERTINENT HISTORY: See evaluation ? ?PRECAUTIONS: Fall ? ?SUBJECTIVE:   Pt. C/o increase R knee pain/ discomfort over past several days.   ? ?PAIN:  ?Are you having pain? Yes: 2-3/10. Pain location: R knee ?Pain description: achy ?Aggravating factors: increase walking/ stairs ?Relieving factors: sitting/ rest ? ? ? ?TODAY'S TREATMENT:  01/13/22 ? ?Neuro Re-ED:  ?  ?Walking in hallway/ clinic with use of RW working on consistent step pattern/ BOS.  ? ?Step to stair climbing 4 steps x 3 with B UE assist on handrails.  R knee pain while descending stairs.   ?  ?Sit to stand from blue mat table 10x.  Standing wt. Shifting with no rollator or UE assist.     ? ?Walking cone taps (alternating) in //-bars (2 laps).   ? ?BERG: 36/56 ?  ?Walking in hallway with use of RW.  Consistent cadence and cuing to correct posture/ step pattern at threshold changes and down outside ramp.  No LOB.   ? ?Therex.  ?  ?Seated LE ex.: marching/LAQ/heel and toe raises 10x2.  Walking in //-bars with increase hip flexion/ step pattern (forward/ lateral)- 3 laps each. ? ?Standing hip extension/ knee flexion/ heel raises (heavy UE assist) 20x each.   ?  ?Nustep L4 B LE only 12+ min., MHP provided to lumbar for comfort.  ?  ?Discussed supine/ standing HEP. ? ? ?PATIENT EDUCATION: ?Education details: Posture correction with standing ex. ?Person educated: Patient ?Education method: Explanation and Demonstration ?Education comprehension: verbalized understanding and returned demonstration ? ? ?HOME EXERCISE PROGRAM: ?Access code: [email or web address] ? ?Pt. Has a progressive standing ex. Program.  Scheduled daily walks with rollator at home.  Increase distances with son or family outside.   ? ? ? ? PT Long Term Goals -   ? ?  ? PT LONG TERM GOAL #1  ? Title Pt. will ambulate 10 minutes with use of RW and SBA/mod. independence to improve walking endurance/ community ambulation.   ? Baseline Pt. ambulates around 5-6 mintues prior to fatigue/ increase knee/ back pain.   ? Time 12   ? Period Weeks   ? Status Ongoing  ? Target Date  04/07/22   ?  ? PT LONG TERM GOAL #2  ? Title Pt. will increase Berg balance test to >40 out of 56 to improve independence with gait/ decrease fall risk.   ? Baseline Berg: 22/56 (significant fall risk); 9/30 32/56.  11/10: 32 (limited by R knee pain)  2/9: 33/56 limited due to R knee and Low Back pain; 8/10: 29/56; 10/5: 38/56, 11/18/20: 28. 04/28/2021: 28/56 07/07/2021: 21/56.  11/29: 31/56 (limited by knee pain/ use of UE assist).  4/19: 36/56 (less back pain)  ? Time 12   ? Period Weeks   ? Status Partially Met   ? Target Date 04/07/22   ?  ? PT LONG TERM GOAL #3  ? Title Pt. able to ambulate 100 feet with consistent 2 point gait pattern and use of least restrictive assistive devce to improve household mobility.   ? Baseline  Pt. able to ambulate with use of RW/ CGA to min. A for safety.   Heavy use of B UE.; 9/30 pt able to ambulate with rollator with inconsistent 2 point gait pattern, heavy reliance on UE CGA/supervision  2/9: Pt. uses 2 point step pattern with walking with rollader with decreased stride length. Pt. would be limited due to pain in the knee walking for >100 ft.; 8/10: Pt demonstrates ability to ambulate 100' with consistent 2 point gait pattern with rollator. 11/18/20: Pt able to amb 130 ft with Rollator and 2-point gait pattern. 04/28/21: Pt able to amb 148 ft with Rollator and 2-point gait pattern 07/07/2021 Pt was able to amb 300 ft with rollator and 2 point gat pattern. Walking rollator is the least restricitive AD as this time. PT required several standing rest breaks.   ? Time 8   ? Period Weeks   ? Status Achieved   ? Target Date 07/07/21   ?  ? PT LONG TERM GOAL #4  ? Title Pt. able to stand from normal chair with no UE assist to improve safety/independence with transfers.    ? Baseline Unable to stand from standard chair without heavy UE assist. 10/6, pt unable to stand from standard chair without UE support.  11/10: benefits from 1 UE assist    2/9: Pt. can rise from normal chair with 1 UE  assist and must control descent with UE assist.; 8/10: Pt requires 2 UE for standing from standard chair height.; 10/5: Requires single UE to stand. 11/18/20: Pt requires B UE support.  04/28/21: Pt requires

## 2022-01-18 ENCOUNTER — Encounter: Payer: Self-pay | Admitting: Physical Therapy

## 2022-01-18 ENCOUNTER — Ambulatory Visit: Payer: Medicare Other | Admitting: Physical Therapy

## 2022-01-18 ENCOUNTER — Ambulatory Visit: Payer: Medicare Other | Admitting: Urology

## 2022-01-18 DIAGNOSIS — M6281 Muscle weakness (generalized): Secondary | ICD-10-CM | POA: Diagnosis not present

## 2022-01-18 DIAGNOSIS — R293 Abnormal posture: Secondary | ICD-10-CM

## 2022-01-18 DIAGNOSIS — R2689 Other abnormalities of gait and mobility: Secondary | ICD-10-CM

## 2022-01-18 DIAGNOSIS — R269 Unspecified abnormalities of gait and mobility: Secondary | ICD-10-CM

## 2022-01-18 NOTE — Therapy (Signed)
?OUTPATIENT PHYSICAL THERAPY TREATMENT NOTE ?  ? ? ?Patient Name: Mike Holloway ?MRN: [id number] ?DOB:23-May-1941, 81 y.o., male ?Today's Date: 12/23/2021 ? ?PCP: [name], MD ?REFERRING PROVIDER: Hessie Knows, MD ? ? ? ? PT End of Session - 01/18/22 1303   ? ? Visit Number 2   ? Number of Visits 12   ? Date for PT Re-Evaluation 04/07/22   ? Authorization - Visit Number 2   ? Authorization - Number of Visits 10   ? PT Start Time 4098 to 1358  ? Activity Tolerance Patient tolerated treatment well;Patient limited by pain;Patient limited by fatigue   ? Behavior During Therapy [facility] for tasks assessed/performed   ? ?  ?  ? ?  ? ? ? ?Past Medical History:  ?Diagnosis Date  ? Arthritis   ? Atrial flutter ([place])   ? Diabetes mellitus ([place])   ? Essential tremor   ? Essential tremor   ? deep brain stimulator   ? Hypercholesteremia   ? Hypertension   ? Incontinence   ? Non-Hodgkin lymphoma ([place])   ? grew on the testical  ? SIADH (syndrome of inappropriate ADH production) ([place])   ? Sleep apnea   ? Stroke [facility])   ? ?Past Surgical History:  ?Procedure Laterality Date  ? ABLATION    ? APPENDECTOMY    ? CATARACT EXTRACTION, BILATERAL    ? COLONOSCOPY WITH PROPOFOL N/A 03/17/2018  ? Procedure: COLONOSCOPY WITH PROPOFOL;  Surgeon: Jonathon Bellows, MD;  Location: [facility] ENDOSCOPY;  Service: Gastroenterology;  Laterality: N/A;  ? DEEP BRAIN STIMULATOR PLACEMENT    ? 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 Procedure: FECAL TRANSPLANT;  Surgeon: Jonathon Bellows, MD;  Location: [facility] ENDOSCOPY;  Service: Gastroenterology;  Laterality: N/A;  ? HEMORRHOID SURGERY    ? HERNIA REPAIR    ? HIP FRACTURE SURGERY    ? INTRAMEDULLARY (IM) NAIL INTERTROCHANTERIC Right 09/16/2018  ? Procedure: INTRAMEDULLARY (IM) NAIL INTERTROCHANTRIC;  Surgeon: Hessie Knows, MD;  Location: [facility] ORS;  Service: Orthopedics;  Laterality: Right;  ? IR CATHETER TUBE CHANGE  11/22/2018  ? NASAL SINUS SURGERY    ? ORCHIECTOMY    ? TONSILLECTOMY    ? ?Patient Active Problem List   ? Diagnosis Date Noted  ? DVT femoral (deep venous thrombosis) with thrombophlebitis, left ([place]) 02/06/2020  ? Lymphedema 04/15/2019  ? Bilateral leg edema 01/25/2019  ? Hyponatremia 12/24/2018  ? SIADH (syndrome of inappropriate ADH production) ([place]) 12/24/2018  ? Erosion of urethra due to catheterization of urinary tract ([place]) 10/27/2018  ? Generalized weakness 10/14/2018  ? Hip fracture ([place]) 09/15/2018  ? 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  ? 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 HLD (hyperlipidemia) 03/24/2018  ? Contusion of right knee 02/14/2018  ? Bradycardia 12/28/2017  ? 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 Tremor 09/04/2016  ? Chronic pain of right knee 08/10/2016  ? Right ankle pain 08/10/2016  ? 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  ? 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[place]) 12/11/2015  ? Urinary retention 09/08/2015  ? Lymphoma, 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[place]) 04/03/2015  ? Breathlessness on exertion 11/21/2014  ? Breath shortness 11/21/2014  ? Arthropathy 11/07/2014  ? Atrial flutter, paroxysmal ([place]) 11/07/2014  ? Type 2 diabetes mellitus ([place]) 11/07/2014  ? Benign essential tremor 11/07/2014  ? Benign essential HTN 11/07/2014  ? Mixed incontinence 11/07/2014  ? Hypercholesterolemia without hypertriglyceridemia 11/07/2014  ? Apnea, sleep 11/07/2014  ? Controlled type 2 diabetes mellitus without complication ([place]) 11/91/4782  ?  Pure hypercholesterolemia 11/07/2014  ? Other abnormalities of gait and mobility 11/02/2011  ? Decreased mobility 11/02/2011  ? Abnormal gait 06/29/2011  ? Discoordination 06/29/2011  ? ? ?REFERRING DIAG: S/p R subtrochanteric hip fracture, S/p R ORIF fracture of hip.   ? ?THERAPY DIAG:  ?Muscle weakness (generalized) ? ?Gait difficulty ? ?Balance problem ? ?Abnormal posture ? ?PERTINENT HISTORY: See evaluation ? ?PRECAUTIONS: Fall ? ?SUBJECTIVE:  Pt. States he did a lot of walking at end of last week.  Pt.  Had to walk the whole distance of [facility] because 1 elevator was broken.     ? ?PAIN:  ?Are you having pain? Yes: 2-3/10. Pain location: R knee ?Pain description: achy ?Aggravating factors: increase walking/ stairs ?Relieving factors: sitting/ rest ? ? ? ?TODAY'S TREATMENT:  01/18/22 ? ?Neuro Re-ED:  ?  ?Walking in //-bars (forward/ backwards) with cuing to increase hip flexion/ step length/ heel strike.  Pt. Benefits from use of mirror for BOS/ upright posture.  5 laps before seated break.  ? ?Walking in //-bars: hurdles (6" and 12").  Recip. Hip flexion.  Pt. Challenged with 12" hurdles (no mistakes).  ? ?Standing wt. Shifting/ turning CW and CCW in //-bars with light UE assist.   ? ?Standing posture correction with mirror feedback.  Shoulder flexion/ abduction.   ? ?Walking in hallway with use of RW.  Consistent cadence and cuing to correct posture/ step pattern at threshold changes and down outside ramp.  No LOB.   ? ?Therex.  ?  ?Seated LE ex. (4#): marching/LAQ/heel and toe raises 10x2.  Walking in //-bars with increase hip flexion/ step pattern (forward/ lateral)- 3 laps each. ? ?Standing (4#) hip extension/ knee flexion/ heel raises (heavy UE assist) 20x each.   ?  ?Nustep L4 B LE only 12+ min., MHP provided to lumbar for comfort.  ? ? ? ?PATIENT EDUCATION: ?Education details: Posture correction with standing ex. ?Person educated: Patient ?Education method: Explanation and Demonstration ?Education comprehension: verbalized understanding and returned demonstration ? ? ?HOME EXERCISE PROGRAM: ?Access code: [email or web address] ? ?Pt. Has a progressive standing ex. Program.  Scheduled daily walks with rollator at home.  Increase distances with son or family outside.   ? ? ? ? PT Long Term Goals -   ? ?  ? PT LONG TERM GOAL #1  ? Title Pt. will ambulate 10 minutes with use of RW and SBA/mod. independence to improve walking endurance/ community ambulation.   ? Baseline Pt. ambulates around 5-6 mintues prior to  fatigue/ increase knee/ back pain.   ? Time 12   ? Period Weeks   ? Status Ongoing  ? Target Date 04/07/22   ?  ? PT LONG TERM GOAL #2  ? Title Pt. will increase Berg balance test to >40 out of 56 to improve independence with gait/ decrease fall risk.   ? Baseline Berg: 22/56 (significant fall risk); 9/30 32/56.  11/10: 32 (limited by R knee pain)  2/9: 33/56 limited due to R knee and Low Back pain; 8/10: 29/56; 10/5: 38/56, 11/18/20: 28. 04/28/2021: 28/56 07/07/2021: 21/56.  11/29: 31/56 (limited by knee pain/ use of UE assist).  4/19: 36/56 (less back pain)  ? Time 12   ? Period Weeks   ? Status Partially Met   ? Target Date 04/07/22   ?  ? PT LONG TERM GOAL #3  ? Title Pt. able to ambulate 100 feet with consistent 2 point gait pattern and use of least restrictive assistive devce to  improve household mobility.   ? Baseline Pt. able to ambulate with use of RW/ CGA to min. A for safety.   Heavy use of B UE.; 9/30 pt able to ambulate with rollator with inconsistent 2 point gait pattern, heavy reliance on UE CGA/supervision  2/9: Pt. uses 2 point step pattern with walking with rollader with decreased stride length. Pt. would be limited due to pain in the knee walking for >100 ft.; 8/10: Pt demonstrates ability to ambulate 100' with consistent 2 point gait pattern with rollator. 11/18/20: Pt able to amb 130 ft with Rollator and 2-point gait pattern. 04/28/21: Pt able to amb 148 ft with Rollator and 2-point gait pattern 07/07/2021 Pt was able to amb 300 ft with rollator and 2 point gat pattern. Walking rollator is the least restricitive AD as this time. PT required several standing rest breaks.   ? Time 8   ? Period Weeks   ? Status Achieved   ? Target Date 07/07/21   ?  ? PT LONG TERM GOAL #4  ? Title Pt. able to stand from normal chair with no UE assist to improve safety/independence with transfers.    ? Baseline Unable to stand from standard chair without heavy UE assist. 10/6, pt unable to stand from standard chair  without UE support.  11/10: benefits from 1 UE assist    2/9: Pt. can rise from normal chair with 1 UE assist and must control descent with UE assist.; 8/10: Pt requires 2 UE for standing from standard chair height

## 2022-01-26 ENCOUNTER — Encounter: Payer: Self-pay | Admitting: Physical Therapy

## 2022-01-26 ENCOUNTER — Ambulatory Visit: Payer: Medicare Other | Attending: Orthopedic Surgery | Admitting: Physical Therapy

## 2022-01-26 DIAGNOSIS — R2689 Other abnormalities of gait and mobility: Secondary | ICD-10-CM | POA: Diagnosis present

## 2022-01-26 DIAGNOSIS — R269 Unspecified abnormalities of gait and mobility: Secondary | ICD-10-CM | POA: Insufficient documentation

## 2022-01-26 DIAGNOSIS — M6281 Muscle weakness (generalized): Secondary | ICD-10-CM | POA: Insufficient documentation

## 2022-01-26 DIAGNOSIS — R293 Abnormal posture: Secondary | ICD-10-CM | POA: Diagnosis present

## 2022-01-26 NOTE — Therapy (Signed)
?OUTPATIENT PHYSICAL THERAPY TREATMENT NOTE/RECERTIFICATION ?  ? ? ?Patient Name: Mike Holloway ?MRN: [id number] ?DOB:[birth year], 81 y.o., male ?Today's Date: 12/23/2021 ? ?PCP: [name], MD ?REFERRING PROVIDER: Hessie Knows, MD ? ? PT End of Session - 01/26/22 1257   ? ? Visit Number 3   ? Number of Visits 12   ? Date for PT Re-Evaluation 04/07/22   ? Authorization - Visit Number 3   ? Authorization - Number of Visits 10   ? PT Start Time 4917   ? PT Stop Time 1402   ? PT Time Calculation (min) 65 min   ? Activity Tolerance Patient tolerated treatment well;Patient limited by fatigue;Patient limited by pain   ? Behavior During Therapy [facility] for tasks assessed/performed   ? ?  ?  ? ?  ? ? ? ? ?Past Medical History:  ?Diagnosis Date  ? Arthritis   ? Atrial flutter ([place])   ? Diabetes mellitus ([place])   ? Essential tremor   ? Essential tremor   ? deep brain stimulator   ? Hypercholesteremia   ? Hypertension   ? Incontinence   ? Non-Hodgkin lymphoma ([place])   ? grew on the testical  ? SIADH (syndrome of inappropriate ADH production) ([place])   ? Sleep apnea   ? Stroke [facility])   ? ?Past Surgical History:  ?Procedure Laterality Date  ? ABLATION    ? APPENDECTOMY    ? CATARACT EXTRACTION, BILATERAL    ? COLONOSCOPY WITH PROPOFOL N/A 03/17/2018  ? Procedure: COLONOSCOPY WITH PROPOFOL;  Surgeon: Jonathon Bellows, MD;  Location[facility] ENDOSCOPY;  Service: Gastroenterology;  Laterality: N/A;  ? DEEP BRAIN STIMULATOR PLACEMENT    ? 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 Procedure: FECAL TRANSPLANT;  Surgeon: Jonathon Bellows, MD;  Location: [facility] ENDOSCOPY;  Service: Gastroenterology;  Laterality: N/A;  ? HEMORRHOID SURGERY    ? HERNIA REPAIR    ? HIP FRACTURE SURGERY    ? INTRAMEDULLARY (IM) NAIL INTERTROCHANTERIC Right 09/16/2018  ? Procedure: INTRAMEDULLARY (IM) NAIL INTERTROCHANTRIC;  Surgeon: Hessie Knows, MD;  Location: [facility] ORS;  Service: Orthopedics;  Laterality: Right;  ? IR CATHETER TUBE CHANGE  11/22/2018  ? NASAL SINUS SURGERY    ?  ORCHIECTOMY    ? TONSILLECTOMY    ? ?Patient Active Problem List  ? Diagnosis Date Noted  ? DVT femoral (deep venous thrombosis) with thrombophlebitis, left ([place]) 02/06/2020  ? Lymphedema 04/15/2019  ? Bilateral leg edema 01/25/2019  ? Hyponatremia 12/24/2018  ? SIADH (syndrome of inappropriate ADH production) ([place]) 12/24/2018  ? Erosion of urethra due to catheterization of urinary tract ([place]) 10/27/2018  ? Generalized weakness 10/14/2018  ? Hip fracture ([place]) 09/15/2018  ? 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  ? 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 HLD (hyperlipidemia) 03/24/2018  ? Contusion of right knee 02/14/2018  ? Bradycardia 12/28/2017  ? 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 Tremor 09/04/2016  ? Chronic pain of right knee 08/10/2016  ? Right ankle pain 08/10/2016  ? 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  ? 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[place]) 12/11/2015  ? Urinary retention 09/08/2015  ? Lymphoma, 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[place]) 04/03/2015  ? Breathlessness on exertion 11/21/2014  ? Breath shortness 11/21/2014  ? Arthropathy 11/07/2014  ? Atrial flutter, paroxysmal ([place]) 11/07/2014  ? Type 2 diabetes mellitus ([place]) 11/07/2014  ? Benign essential tremor 11/07/2014  ? Benign essential HTN 11/07/2014  ? Mixed incontinence 11/07/2014  ? Hypercholesterolemia without hypertriglyceridemia 11/07/2014  ? Apnea,  sleep 11/07/2014  ? Controlled type 2 diabetes mellitus without complication ([place]) 16/06/9603  ? Pure hypercholesterolemia 11/07/2014  ? Other abnormalities of gait and mobility 11/02/2011  ? Decreased mobility 11/02/2011  ? Abnormal gait 06/29/2011  ? Discoordination 06/29/2011  ? ? ?REFERRING DIAG: S/p R subtrochanteric hip fracture, S/p R ORIF fracture of hip.   ? ?THERAPY DIAG:  ?Muscle weakness (generalized) ? ?Gait difficulty ? ?Balance problem ? ?Abnormal posture ? ?PERTINENT HISTORY: See evaluation ? ?PRECAUTIONS: Fall ? ?SUBJECTIVE:   Pt. C/o R knee pain/ discomfort but nothing new to report.  Pt. Has MD appt. With Urologist tomorrow to discuss possibility of suprapubic catheter.     ? ?PAIN:  ?Are you having pain? Yes: 2-3/10. Pain location: R knee ?Pain description: achy ?Aggravating factors: increase walking/ stairs ?Relieving factors: sitting/ rest ? ? ? ?TODAY'S TREATMENT:   ? ?01/26/22 ? ?Neuro Re-ED:  ?  ?Walking in hallway/ clinic with use of RW working on consistent step pattern/ BOS.  Walking at agility ladder with use of blue marks to maintain step length/heel strike.  4 laps at agility ladder with rollator.   ?  ?Sit to stand from blue mat table 10x.  Standing wt. Shifting with no rollator or UE assist.     ? ?Standing step touches (toe touch and then heel touches)- 10x2 each.  Ascending and descending stairs with step to gait pattern with B handrail assist.  2x with SBA/mod.I.  ? ?Walking in //-bars with recip. 6" hurdles and step to 12" hurdles 1 lap x 2 with seated rest break between reps.   ?  ?Walking in hallway with use of RW.  Consistent cadence and cuing to correct posture/ step pattern at threshold changes and down outside ramp.  No LOB.   ? ?Therex.  ?  ?Seated LE ex.: marching/LAQ/heel and toe raises 10x2.  Walking in //-bars with increase hip flexion/ step pattern (forward/ lateral)- 3 laps each. ? ?Standing hip extension/ knee flexion/ heel raises (heavy UE assist) 20x each.  Increase R LBP during standing hip extension (fatigue noted).   ?  ?Nustep L4 B LE only 12+ min., MHP provided to lumbar for comfort.  ?  ? ? ?01/20/22 ? ?Neuro Re-ED:  ?  ?Walking in //-bars (forward/ backwards) with cuing to increase hip flexion/ step length/ heel strike.  Pt. Benefits from use of mirror for BOS/ upright posture.  5 laps before seated break.  ? ?Walking in //-bars: hurdles (6" and 12").  Recip. Hip flexion.  Pt. Challenged with 12" hurdles (no mistakes).  ? ?Standing wt. Shifting/ turning CW and CCW in //-bars with light UE assist.    ? ?Standing posture correction with mirror feedback.  Shoulder flexion/ abduction.   ? ?Walking in hallway with use of RW.  Consistent cadence and cuing to correct posture/ step pattern at threshold changes and down outside ramp.  No LOB.   ? ?Therex.  ?  ?Seated LE ex. (4#): marching/LAQ/heel and toe raises 10x2.  Walking in //-bars with increase hip flexion/ step pattern (forward/ lateral)- 3 laps each. ? ?Standing (4#) hip extension/ knee flexion/ heel raises (heavy UE assist) 20x each.   ?  ?Nustep L4 B LE only 12+ min., MHP provided to lumbar for comfort.  ? ? ? ? ? ?PATIENT EDUCATION: ?Education details: Posture correction with standing ex. ?Person educated: Patient ?Education method: Explanation and Demonstration ?Education comprehension: verbalized understanding and returned demonstration ? ? ?HOME EXERCISE PROGRAM: ?Access code: [email or web address] ? ?Pt.  Has a progressive standing ex. Program.  Scheduled daily walks with rollator at home.  Increase distances with son or family outside.   ? ? ? ? PT Long Term Goals -   ? ?  ? PT LONG TERM GOAL #1  ? Title Pt. will ambulate 10 minutes with use of RW and SBA/mod. independence to improve walking endurance/ community ambulation.   ? Baseline Pt. ambulates around 5-6 mintues prior to fatigue/ increase knee/ back pain.   ? Time 12   ? Period Weeks   ? Status Ongoing  ? Target Date 04/07/22   ?  ? PT LONG TERM GOAL #2  ? Title Pt. will increase Berg balance test to >40 out of 56 to improve independence with gait/ decrease fall risk.   ? Baseline Berg: 22/56 (significant fall risk); 9/30 32/56.  11/10: 32 (limited by R knee pain)  2/9: 33/56 limited due to R knee and Low Back pain; 8/10: 29/56; 10/5: 38/56, 11/18/20: 28. 04/28/2021: 28/56 07/07/2021: 21/56.  11/29: 31/56 (limited by knee pain/ use of UE assist).  4/19: 36/56 (less back pain)  ? Time 12   ? Period Weeks   ? Status Partially Met   ? Target Date 04/07/22   ?  ? PT LONG TERM GOAL #3  ? Title Pt. able to ambulate  100 feet with consistent 2 point gait pattern and use of least restrictive assistive devce to improve household mobility.   ? Baseline Pt. able to ambulate with use of RW/ CGA to min. A for safety.

## 2022-02-02 ENCOUNTER — Ambulatory Visit: Payer: Medicare Other | Admitting: Physical Therapy

## 2022-02-02 ENCOUNTER — Encounter: Payer: Self-pay | Admitting: Physical Therapy

## 2022-02-02 DIAGNOSIS — R2689 Other abnormalities of gait and mobility: Secondary | ICD-10-CM

## 2022-02-02 DIAGNOSIS — M6281 Muscle weakness (generalized): Secondary | ICD-10-CM | POA: Diagnosis not present

## 2022-02-02 DIAGNOSIS — R269 Unspecified abnormalities of gait and mobility: Secondary | ICD-10-CM

## 2022-02-02 DIAGNOSIS — R293 Abnormal posture: Secondary | ICD-10-CM

## 2022-02-02 NOTE — Therapy (Addendum)
?OUTPATIENT PHYSICAL THERAPY TREATMENT NOTE ?  ? ? ?Patient Name: Mike Holloway ?MRN: [id number] ?DOB:07-26-1941, 81 y.o., male ?Today's Date: 12/23/2021 ? ?PCP: [name], MD ?REFERRING PROVIDER: Hessie Knows, MD ? ? ? PT End of Session - 02/02/22 1251   ? ? Visit Number 4   ? Number of Visits 12   ? Date for PT Re-Evaluation 04/07/22       1301 to 1350  ? Authorization - Visit Number 4   ? Authorization - Number of Visits 10   ? Activity Tolerance Patient tolerated treatment well;Patient limited by fatigue;Patient limited by pain   ? Behavior During Therapy [facility] for tasks assessed/performed   ? ?  ?  ? ?  ? ? ? ?Past Medical History:  ?Diagnosis Date  ? Arthritis   ? Atrial flutter ([place])   ? Diabetes mellitus ([place])   ? Essential tremor   ? Essential tremor   ? deep brain stimulator   ? Hypercholesteremia   ? Hypertension   ? Incontinence   ? Non-Hodgkin lymphoma ([place])   ? grew on the testical  ? SIADH (syndrome of inappropriate ADH production) ([place])   ? Sleep apnea   ? Stroke [facility])   ? ?Past Surgical History:  ?Procedure Laterality Date  ? ABLATION    ? APPENDECTOMY    ? CATARACT EXTRACTION, BILATERAL    ? COLONOSCOPY WITH PROPOFOL N/A 03/17/2018  ? Procedure: COLONOSCOPY WITH PROPOFOL;  Surgeon: Jonathon Bellows, MD;  Location: [facility] ENDOSCOPY;  Service: Gastroenterology;  Laterality: N/A;  ? DEEP BRAIN STIMULATOR PLACEMENT    ? 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 Procedure: FECAL TRANSPLANT;  Surgeon: Jonathon Bellows, MD;  Location: [facility] ENDOSCOPY;  Service: Gastroenterology;  Laterality: N/A;  ? HEMORRHOID SURGERY    ? HERNIA REPAIR    ? HIP FRACTURE SURGERY    ? INTRAMEDULLARY (IM) NAIL INTERTROCHANTERIC Right 09/16/2018  ? Procedure: INTRAMEDULLARY (IM) NAIL INTERTROCHANTRIC;  Surgeon: Hessie Knows, MD;  Location: [facility] ORS;  Service: Orthopedics;  Laterality: Right;  ? IR CATHETER TUBE CHANGE  11/22/2018  ? NASAL SINUS SURGERY    ? ORCHIECTOMY    ? TONSILLECTOMY    ? ?Patient Active Problem List  ? Diagnosis  Date Noted  ? DVT femoral (deep venous thrombosis) with thrombophlebitis, left ([place]) 02/06/2020  ? Lymphedema 04/15/2019  ? Bilateral leg edema 01/25/2019  ? Hyponatremia 12/24/2018  ? SIADH (syndrome of inappropriate ADH production) ([place]) 12/24/2018  ? Erosion of urethra due to catheterization of urinary tract ([place]) 10/27/2018  ? Generalized weakness 10/14/2018  ? Hip fracture ([place]) 09/15/2018  ? 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  ? 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 HLD (hyperlipidemia) 03/24/2018  ? Contusion of right knee 02/14/2018  ? Bradycardia 12/28/2017  ? 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 Tremor 09/04/2016  ? Chronic pain of right knee 08/10/2016  ? Right ankle pain 08/10/2016  ? 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  ? 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[place]) 12/11/2015  ? Urinary retention 09/08/2015  ? Lymphoma, 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[place]) 04/03/2015  ? Breathlessness on exertion 11/21/2014  ? Breath shortness 11/21/2014  ? Arthropathy 11/07/2014  ? Atrial flutter, paroxysmal ([place]) 11/07/2014  ? Type 2 diabetes mellitus ([place]) 11/07/2014  ? Benign essential tremor 11/07/2014  ? Benign essential HTN 11/07/2014  ? Mixed incontinence 11/07/2014  ? Hypercholesterolemia without hypertriglyceridemia 11/07/2014  ? Apnea, sleep 11/07/2014  ? Controlled type 2 diabetes mellitus without complication ([place]) 03/00/9233  ?  Pure hypercholesterolemia 11/07/2014  ? Other abnormalities of gait and mobility 11/02/2011  ? Decreased mobility 11/02/2011  ? Abnormal gait 06/29/2011  ? Discoordination 06/29/2011  ? ? ?REFERRING DIAG: S/p R subtrochanteric hip fracture, S/p R ORIF fracture of hip.   ? ?THERAPY DIAG:  ?Muscle weakness (generalized) ? ?Gait difficulty ? ?Balance problem ? ?Abnormal posture ? ?PERTINENT HISTORY: See evaluation ? ?PRECAUTIONS: Fall ? ?SUBJECTIVE:  Pt. C/o R knee pain/ discomfort while walking into PT appt.  Pt. Saw  Urologist last week with no change in status.  No plans to place suprapubic catheter.   ? ?PAIN:  ?Are you having pain? Yes: 2-3/10. Pain location: R knee ?Pain description: achy ?Aggravating factors: increase walking/ stairs ?Relieving factors: sitting/ rest ? ? ? ?TODAY'S TREATMENT:   ?02/02/22 ? ?Therex.  ?  ?Seated LE ex. (5#): marching/LAQ/heel and toe raises 20x.  Standing marching/ hip extension/ heel raises in //-bars/ lateral walking- 3 laps each.    Mirror feedback ?  ?Nustep L4 B LE only 12+ min., MHP provided to lumbar for comfort.  ? ?Sit to stands from gray chair with less UE assist 10x.   ? ? ?Neuro Re-ED:  ?  ?Walking in //-bars with 5# ankle wts. Over 6" green hurdles (3 laps). ? ?Lateral walking in //-bars with 5# ankle wts. (3 laps).   ? ?Walking cone taps in //-bars with upright posture correction  ? ?Standing wt. Shifting/ turning CW and CCW in //-bars with light UE assist.   ? ?Standing posture correction with mirror feedback.  Shoulder flexion/ abduction.   ? ?Walking in hallway with use of RW.  Consistent cadence and cuing to correct posture/ step pattern at threshold changes and down outside ramp/ in parking lot.  No LOB.   ? ? ? ? ?01/26/22 ? ?Neuro Re-ED:  ?  ?Walking in hallway/ clinic with use of RW working on consistent step pattern/ BOS.  Walking at agility ladder with use of blue marks to maintain step length/heel strike.  4 laps at agility ladder with rollator.   ?  ?Sit to stand from blue mat table 10x.  Standing wt. Shifting with no rollator or UE assist.     ? ?Standing step touches (toe touch and then heel touches)- 10x2 each.  Ascending and descending stairs with step to gait pattern with B handrail assist.  2x with SBA/mod.I.  ? ?Walking in //-bars with recip. 6" hurdles and step to 12" hurdles 1 lap x 2 with seated rest break between reps.   ?  ?Walking in hallway with use of RW.  Consistent cadence and cuing to correct posture/ step pattern at threshold changes and down outside  ramp.  No LOB.   ? ?Therex.  ?  ?Seated LE ex.: marching/LAQ/heel and toe raises 10x2.  Walking in //-bars with increase hip flexion/ step pattern (forward/ lateral)- 3 laps each. ? ?Standing hip extension/ knee flexion/ heel raises (heavy UE assist) 20x each.  Increase R LBP during standing hip extension (fatigue noted).   ?  ?Nustep L4 B LE only 12+ min., MHP provided to lumbar for comfort.  ?  ? ? ? ? ? ?PATIENT EDUCATION: ?Education details: Posture correction with standing ex. ?Person educated: Patient ?Education method: Explanation and Demonstration ?Education comprehension: verbalized understanding and returned demonstration ? ? ?HOME EXERCISE PROGRAM: ?Access code: [email or web address] ? ?Pt. Has a progressive standing ex. Program.  Scheduled daily walks with rollator at home.  Increase distances with son or family outside.   ? ? ? ?  PT Long Term Goals -   ? ?  ? PT LONG TERM GOAL #1  ? Title Pt. will ambulate 10 minutes with use of RW and SBA/mod. independence to improve walking endurance/ community ambulation.   ? Baseline Pt. ambulates around 5-6 mintues prior to fatigue/ increase knee/ back pain.   ? Time 12   ? Period Weeks   ? Status Ongoing  ? Target Date 04/07/22   ?  ? PT LONG TERM GOAL #2  ? Title Pt. will increase Berg balance test to >40 out of 56 to improve independence with gait/ decrease fall risk.   ? Baseline Berg: 22/56 (significant fall risk); 9/30 32/56.  11/10: 32 (limited by R knee pain)  2/9: 33/56 limited due to R knee and Low Back pain; 8/10: 29/56; 10/5: 38/56, 11/18/20: 28. 04/28/2021: 28/56 07/07/2021: 21/56.  11/29: 31/56 (limited by knee pain/ use of UE assist).  4/19: 36/56 (less back pain)  ? Time 12   ? Period Weeks   ? Status Partially Met   ? Target Date 04/07/22   ?  ? PT LONG TERM GOAL #3  ? Title Pt. able to ambulate 100 feet with consistent 2 point gait pattern and use of least restrictive assistive devce to improve household mobility.   ? Baseline Pt. able to ambulate with use of RW/  CGA to min. A for safety.   Heavy use of B UE.; 9/30 pt able to ambulate with rollator with inconsistent 2 point gait pattern, heavy reliance on UE CGA/supervision  2/9: Pt. uses 2 point step pattern with wal

## 2022-02-09 ENCOUNTER — Encounter: Payer: Self-pay | Admitting: Physical Therapy

## 2022-02-09 ENCOUNTER — Ambulatory Visit: Payer: Medicare Other | Admitting: Physical Therapy

## 2022-02-09 DIAGNOSIS — R293 Abnormal posture: Secondary | ICD-10-CM

## 2022-02-09 DIAGNOSIS — R269 Unspecified abnormalities of gait and mobility: Secondary | ICD-10-CM

## 2022-02-09 DIAGNOSIS — M6281 Muscle weakness (generalized): Secondary | ICD-10-CM | POA: Diagnosis not present

## 2022-02-09 DIAGNOSIS — R2689 Other abnormalities of gait and mobility: Secondary | ICD-10-CM

## 2022-02-09 NOTE — Therapy (Addendum)
?OUTPATIENT PHYSICAL THERAPY TREATMENT NOTE ?  ? ? ?Patient Name: Mike Holloway ?MRN: [id number] ?DOB:[birth year], 81 y.o., male ?Today's Date: 12/23/2021 ? ?PCP: [name], MD ?REFERRING PROVIDER: Hessie Knows, MD ? ? ? ? PT End of Session - 02/09/22 1308   ? ? Visit Number 5   ? Number of Visits 12   ? Date for PT Re-Evaluation 04/07/22   ? Authorization - Visit Number 5   ? Authorization - Number of Visits 10   ? PT Start Time 1301   ? Activity Tolerance Patient tolerated treatment well;Patient limited by fatigue;Patient limited by pain   ? Behavior During Therapy [facility] for tasks assessed/performed   ? ?  ?  ? ?  ? ? ?Past Medical History:  ?Diagnosis Date  ? Arthritis   ? Atrial flutter ([place])   ? Diabetes mellitus ([place])   ? Essential tremor   ? Essential tremor   ? deep brain stimulator   ? Hypercholesteremia   ? Hypertension   ? Incontinence   ? Non-Hodgkin lymphoma ([place])   ? grew on the testical  ? SIADH (syndrome of inappropriate ADH production) ([place])   ? Sleep apnea   ? Stroke [facility])   ? ?Past Surgical History:  ?Procedure Laterality Date  ? ABLATION    ? APPENDECTOMY    ? CATARACT EXTRACTION, BILATERAL    ? COLONOSCOPY WITH PROPOFOL N/A 03/17/2018  ? Procedure: COLONOSCOPY WITH PROPOFOL;  Surgeon: Jonathon Bellows, MD;  Location: [facility] ENDOSCOPY;  Service: Gastroenterology;  Laterality: N/A;  ? DEEP BRAIN STIMULATOR PLACEMENT    ? 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 Procedure: FECAL TRANSPLANT;  Surgeon: Jonathon Bellows, MD;  Location: [facility] ENDOSCOPY;  Service: Gastroenterology;  Laterality: N/A;  ? HEMORRHOID SURGERY    ? HERNIA REPAIR    ? HIP FRACTURE SURGERY    ? INTRAMEDULLARY (IM) NAIL INTERTROCHANTERIC Right 09/16/2018  ? Procedure: INTRAMEDULLARY (IM) NAIL INTERTROCHANTRIC;  Surgeon: Hessie Knows, MD;  Location: [facility] ORS;  Service: Orthopedics;  Laterality: Right;  ? IR CATHETER TUBE CHANGE  11/22/2018  ? NASAL SINUS SURGERY    ? ORCHIECTOMY    ? TONSILLECTOMY    ? ?Patient Active Problem List  ?  Diagnosis Date Noted  ? DVT femoral (deep venous thrombosis) with thrombophlebitis, left ([place]) 02/06/2020  ? Lymphedema 04/15/2019  ? Bilateral leg edema 01/25/2019  ? Hyponatremia 12/24/2018  ? SIADH (syndrome of inappropriate ADH production) ([place]) 12/24/2018  ? Erosion of urethra due to catheterization of urinary tract ([place]) 10/27/2018  ? Generalized weakness 10/14/2018  ? Hip fracture ([place]) 09/15/2018  ? 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  ? 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 HLD (hyperlipidemia) 03/24/2018  ? Contusion of right knee 02/14/2018  ? Bradycardia 12/28/2017  ? 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 Tremor 09/04/2016  ? Chronic pain of right knee 08/10/2016  ? Right ankle pain 08/10/2016  ? 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  ? 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 Urinary retention 09/08/2015  ? Lymphoma, non-Hodgkin's ([place]) 04/03/2015  ? Breathlessness on exertion 11/21/2014  ? Breath shortness 11/21/2014  ? Arthropathy 11/07/2014  ? Atrial flutter, paroxysmal ([place]) 11/07/2014  ? Type 2 diabetes mellitus ([place]) 11/07/2014  ? Benign essential tremor 11/07/2014  ? Benign essential HTN 11/07/2014  ? Mixed incontinence 11/07/2014  ? Hypercholesterolemia without hypertriglyceridemia 11/07/2014  ? Apnea, sleep 11/07/2014  ? Controlled type 2 diabetes mellitus without complication ([place]) 23/30/0762  ? Pure  hypercholesterolemia 11/07/2014  ? Other abnormalities of gait and mobility 11/02/2011  ? Decreased mobility 11/02/2011  ? Abnormal gait 06/29/2011  ? Discoordination 06/29/2011  ? ? ?REFERRING DIAG: S/p R subtrochanteric hip fracture, S/p R ORIF fracture of hip.   ? ?THERAPY DIAG:  ?Muscle weakness (generalized) ? ?Gait difficulty ? ?Balance problem ? ?Abnormal posture ? ?PERTINENT HISTORY: See evaluation ? ?PRECAUTIONS: Fall ? ?SUBJECTIVE:  Pt. States he had a nice weekend at son's house on Mother's Day.   No new complaints.  No f/u visits with Dr. Rudene Christians noted.   ? ?PAIN:  ?Are you having pain? Yes: 2-3/10. Pain location: R knee ?Pain description: achy ?Aggravating factors: increase walking/ stairs ?Relieving factors: sitting/ rest ? ? ? ?TODAY'S TREATMENT:   ?02/09/22 ? ?Therex.  ? ?Walking in //-bars (4# ankle wts): forward/ lateral (3 laps).  Walking around clinic with ankle wts. On and use of rollator (cuing to increase hip flexion/ step length).   Seated LAQ/ heel and toe raises 20x.   ? ?Sit to stands from blue mat table with less UE assist (SBA for cuing/ safety).  10x.   ? ?4# ankle wt. Step ups at 1st step and B handrail assist.  10x on L ?  ?Nustep L4 B LE only 14+ min., MHP provided to lumbar for comfort.  ?  ? ?Neuro Re-ED:  No charge ?  ?Walking in hallway with use of RW.  Consistent cadence and cuing to correct posture/ step pattern at threshold changes and down outside ramp/ in parking lot.  No LOB.  Assisted pt. With putting rollator in back on minivan.   ? ? ? ? ?PATIENT EDUCATION: ?Education details: Posture correction with standing ex. ?Person educated: Patient ?Education method: Explanation and Demonstration ?Education comprehension: verbalized understanding and returned demonstration ? ? ?HOME EXERCISE PROGRAM: ?Access code: [email or web address] ? ?Pt. Has a progressive standing ex. Program.  Scheduled daily walks with rollator at home.  Increase distances with son or family outside.   ? ? ? ? PT Long Term Goals -   ? ?  ? PT LONG TERM GOAL #1  ? Title Pt. will ambulate 10 minutes with use of RW and SBA/mod. independence to improve walking endurance/ community ambulation.   ? Baseline Pt. ambulates around 5-6 mintues prior to fatigue/ increase knee/ back pain.   ? Time 12   ? Period Weeks   ? Status Ongoing  ? Target Date 04/07/22   ?  ? PT LONG TERM GOAL #2  ? Title Pt. will increase Berg balance test to >40 out of 56 to improve independence with gait/ decrease fall risk.   ? Baseline Berg: 22/56 (significant  fall risk); 9/30 32/56.  11/10: 32 (limited by R knee pain)  2/9: 33/56 limited due to R knee and Low Back pain; 8/10: 29/56; 10/5: 38/56, 11/18/20: 28. 04/28/2021: 28/56 07/07/2021: 21/56.  11/29: 31/56 (limited by knee pain/ use of UE assist).  4/19: 36/56 (less back pain)  ? Time 12   ? Period Weeks   ? Status Partially Met   ? Target Date 04/07/22   ?  ? PT LONG TERM GOAL #3  ? Title Pt. able to ambulate 100 feet with consistent 2 point gait pattern and use of least restrictive assistive devce to improve household mobility.   ? Baseline Pt. able to ambulate with use of RW/ CGA to min. A for safety.   Heavy use of B UE.; 9/30 pt able to ambulate with rollator with  inconsistent 2 point gait pattern, heavy reliance on UE CGA/supervision  2/9: Pt. uses 2 point step pattern with walking with rollader with decreased stride length. Pt. would be limited due to pain in the knee walking for >100 ft.; 8/10: Pt demonstrates ability to ambulate 100' with consistent 2 point gait pattern with rollator. 11/18/20: Pt able to amb 130 ft with Rollator and 2-point gait pattern. 04/28/21: Pt able to amb 148 ft with Rollator and 2-point gait pattern 07/07/2021 Pt was able to amb 300 ft with rollator and 2 point gat pattern. Walking rollator is the least restricitive AD as this time. PT required several standing rest breaks.   ? Time 8   ? Period Weeks   ? Status Achieved   ? Target Date 07/07/21   ?  ? PT LONG TERM GOAL #4  ? Title Pt. able to stand from normal chair with no UE assist to improve safety/independence with transfers.    ? Baseline Unable to stand from standard chair without heavy UE assist. 10/6, pt unable to stand from standard chair without UE support.  11/10: benefits from 1 UE assist    2/9: Pt. can rise from normal chair with 1 UE assist and must control descent with UE assist.; 8/10: Pt requires 2 UE for standing from standard chair height.; 10/5: Requires single UE to stand. 11/18/20: Pt requires B UE support.  04/28/21:  Pt requires single UE support 07/07/2021: Pt continues to require R UE, unable to complete without UE when attempted.   ? Time 8   ? Period Weeks   ? Status Partially Met   ? Target Date 04/07/22   ?  ? PT

## 2022-02-11 ENCOUNTER — Ambulatory Visit (INDEPENDENT_AMBULATORY_CARE_PROVIDER_SITE_OTHER): Payer: Medicare Other | Admitting: Physician Assistant

## 2022-02-11 DIAGNOSIS — R339 Retention of urine, unspecified: Secondary | ICD-10-CM

## 2022-02-11 DIAGNOSIS — Z435 Encounter for attention to cystostomy: Secondary | ICD-10-CM

## 2022-02-11 DIAGNOSIS — Z978 Presence of other specified devices: Secondary | ICD-10-CM

## 2022-02-11 NOTE — Progress Notes (Signed)
Cath Change/ Replacement  Patient is present today for a catheter change due to urinary retention.  35m of water was removed from the balloon, a 16FR coude foley cath was removed without difficulty.  Patient was cleaned and prepped in a sterile fashion with betadine and 2% lidocaine jelly was instilled into the urethra. A 16 FR coude foley cath was replaced into the bladder no complications were noted Urine return was noted 182mand urine was yellow in color. The balloon was filled with 1038mf sterile water. A leg bag was attached for drainage.  Patient tolerated well.    Performed by: SamDebroah LoopA-C   Additional Notes: [facility].  They are willing to perform the procedure, however at this point the plan will be to maintain his urethral Foley pending worsening catheter erosion.  Follow up: Return in about 4 weeks (around 03/11/2022) for Catheter exchange.

## 2022-02-16 ENCOUNTER — Ambulatory Visit: Payer: Medicare Other | Admitting: Physical Therapy

## 2022-02-16 DIAGNOSIS — R269 Unspecified abnormalities of gait and mobility: Secondary | ICD-10-CM

## 2022-02-16 DIAGNOSIS — R2689 Other abnormalities of gait and mobility: Secondary | ICD-10-CM

## 2022-02-16 DIAGNOSIS — M6281 Muscle weakness (generalized): Secondary | ICD-10-CM

## 2022-02-16 DIAGNOSIS — R293 Abnormal posture: Secondary | ICD-10-CM

## 2022-02-20 NOTE — Therapy (Signed)
OUTPATIENT PHYSICAL THERAPY TREATMENT NOTE     Patient Name: Mike Holloway MRN: [id number] DOB:28-Aug-1941, 81 y.o., male Today's Date: 02/16/2022  PCP: [name], MD REFERRING PROVIDER: Hessie Knows, MD     PT End of Session - 02/20/22 1628     Visit Number 6    Number of Visits 12    Date for PT Re-Evaluation 04/07/22    Authorization - Visit Number 6    Authorization - Number of Visits 10    PT Start Time 2924    PT Stop Time 1354    PT Time Calculation (min) 51 min    Activity Tolerance Patient tolerated treatment well;Patient limited by pain    Behavior During Therapy [facility] for tasks assessed/performed             Past Medical History:  Diagnosis Date   Arthritis    Atrial flutter ([place])    Diabetes mellitus ([place])    Essential tremor    Essential tremor    deep brain stimulator    Hypercholesteremia    Hypertension    Incontinence    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   SIADH (syndrome of inappropriate ADH production) ([place])    Sleep apnea    Stroke [facility])    Past Surgical History:  Procedure Laterality Date   ABLATION     APPENDECTOMY     CATARACT EXTRACTION, BILATERAL     COLONOSCOPY WITH PROPOFOL N/A 03/17/2018   Procedure: COLONOSCOPY WITH PROPOFOL;  Surgeon: Jonathon Bellows, MD;  Location: [facility] 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   Procedure: FECAL TRANSPLANT;  Surgeon: Jonathon Bellows, MD;  Location: [facility] ENDOSCOPY;  Service: Gastroenterology;  Laterality: N/A;   HEMORRHOID SURGERY     HERNIA REPAIR     HIP FRACTURE SURGERY     INTRAMEDULLARY (IM) NAIL INTERTROCHANTERIC Right 09/16/2018   Procedure: INTRAMEDULLARY (IM) NAIL INTERTROCHANTRIC;  Surgeon: Hessie Knows, MD;  Location: [facility] ORS;  Service: Orthopedics;  Laterality: Right;   IR CATHETER TUBE CHANGE  11/22/2018   NASAL SINUS SURGERY     ORCHIECTOMY     TONSILLECTOMY      Patient Active Problem List   Diagnosis Date Noted   DVT femoral (deep venous thrombosis) with thrombophlebitis, left ([place]) 02/06/2020   Lymphedema 04/15/2019   Bilateral leg edema 01/25/2019   Hyponatremia 12/24/2018   SIADH (syndrome of inappropriate ADH production) ([place]) 12/24/2018   Erosion of urethra due to catheterization of urinary tract ([place]) 10/27/2018   Generalized weakness 10/14/2018   Hip fracture ([place]) 09/15/2018   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   HLD (hyperlipidemia) 03/24/2018   Contusion of right knee 02/14/2018   Bradycardia 12/28/2017   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   Tremor 09/04/2016   Chronic pain of right knee 08/10/2016   Right ankle pain 08/10/2016   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   Non-Hodgkin's lymphoma ([place]) 12/11/2015   Urinary retention 09/08/2015   Lymphoma, non-Hodgkin's ([place]) 04/03/2015   Breathlessness on exertion 11/21/2014   Breath shortness 11/21/2014   Arthropathy 11/07/2014   Atrial flutter, paroxysmal ([place]) 11/07/2014   Type 2 diabetes mellitus ([place]) 11/07/2014   Benign essential tremor 11/07/2014   Benign essential HTN 11/07/2014   Mixed incontinence 11/07/2014   Hypercholesterolemia without hypertriglyceridemia 11/07/2014   Apnea, sleep 11/07/2014  Controlled type 2 diabetes mellitus without complication ([place]) 70/17/7939   Pure hypercholesterolemia 11/07/2014   Other abnormalities of gait and mobility 11/02/2011   Decreased mobility 11/02/2011   Abnormal gait 06/29/2011   Discoordination 06/29/2011    REFERRING DIAG: S/p R subtrochanteric hip fracture, S/p R ORIF fracture of hip.    THERAPY DIAG:  Muscle weakness (generalized)  Gait difficulty  Balance problem  Abnormal posture  PERTINENT HISTORY: See evaluation  PRECAUTIONS: Fall  SUBJECTIVE:  Pt. Reports he is doing well.   PT spoke with pts. Wife during start of tx. Session and she reports that pt. Has been doing better with walking in house and standing without UE assist.  No LOB during last week reported.  Pt. Continues to have persistent R knee pain with wt. Bearing activities.    PAIN:  Are you having pain? Yes: 2-3/10. Pain location: R knee Pain description: achy Aggravating factors: increase walking/ stairs Relieving factors: sitting/ rest    TODAY'S TREATMENT:    02/16/22:   Therex:   4# ankle wts.: seated marching/ LAQ/ heel raises 20x.  Walking in //-bars forward/ backwards/ lateral 3 laps each (B UE assist required and cuing for posture correction)- mirror feedback.     Nustep L4 BLE only 10+ min. (Consistent cadence with MH to low back).    Neuro:   Functional reaching (cones on elevated hospital table)- varying distances with CGA for safety.  Turning/ wt. Shifting in //-bars to L/R   Alt. Tandem stance in //-bars with focus on BOS/ upright posture.  CGA for safety.     Walking in hallway and PT gym with use of RW.  Consistent cadence and cuing to correct posture/ step pattern at threshold changes and down outside ramp/ in parking lot.  No LOB.     02/09/22  Therex.   Walking in //-bars (4# ankle wts): forward/ lateral (3 laps).  Walking around clinic with ankle wts. On and use of rollator (cuing to increase hip flexion/ step length).   Seated LAQ/ heel and toe raises 20x.    Sit to stands from blue mat table with less UE assist (SBA for cuing/ safety).  10x.    4# ankle wt. Step ups at 1st step and B handrail assist.  10x on L   Nustep L4 B LE only 14+ min., MHP provided to lumbar for comfort.     Neuro Re-ED:  No charge   Walking in hallway with use of RW.  Consistent cadence and cuing to correct posture/ step pattern at threshold changes and down outside ramp/ in parking lot.  No LOB.  Assisted pt. With putting rollator in back on minivan.       PATIENT EDUCATION: Education  details: Posture correction with standing ex. Person educated: Patient Education method: Customer service manager Education comprehension: verbalized understanding and returned demonstration   HOME EXERCISE PROGRAM: Access code: [email or web address]  Pt. Has a progressive standing ex. Program.  Scheduled daily walks with rollator at home.  Increase distances with son or family outside.       PT Long Term Goals -       PT LONG TERM GOAL #1   Title Pt. will ambulate 10 minutes with use of RW and SBA/mod. independence to improve walking endurance/ community ambulation.    Baseline Pt. ambulates around 5-6 mintues prior to fatigue/ increase knee/ back pain.    Time 12    Period Weeks    Status Ongoing   Target Date  04/07/22      PT LONG TERM GOAL #2   Title Pt. will increase Berg balance test to >40 out of 56 to improve independence with gait/ decrease fall risk.    Baseline Berg: 22/56 (significant fall risk); 9/30 32/56.  11/10: 32 (limited by R knee pain)  2/9: 33/56 limited due to R knee and Low Back pain; 8/10: 29/56; 10/5: 38/56, 11/18/20: 28. 04/28/2021: 28/56 07/07/2021: 21/56.  11/29: 31/56 (limited by knee pain/ use of UE assist).  4/19: 36/56 (less back pain)   Time 12    Period Weeks    Status Partially Met    Target Date 04/07/22      PT LONG TERM GOAL #3   Title Pt. able to ambulate 100 feet with consistent 2 point gait pattern and use of least restrictive assistive devce to improve household mobility.    Baseline Pt. able to ambulate with use of RW/ CGA to min. A for safety.   Heavy use of B UE.; 9/30 pt able to ambulate with rollator with inconsistent 2 point gait pattern, heavy reliance on UE CGA/supervision  2/9: Pt. uses 2 point step pattern with walking with rollader with decreased stride length. Pt. would be limited due to pain in the knee walking for >100 ft.; 8/10: Pt demonstrates ability to ambulate 100' with consistent 2 point gait pattern with rollator. 11/18/20: Pt able  to amb 130 ft with Rollator and 2-point gait pattern. 04/28/21: Pt able to amb 148 ft with Rollator and 2-point gait pattern 07/07/2021 Pt was able to amb 300 ft with rollator and 2 point gat pattern. Walking rollator is the least restricitive AD as this time. PT required several standing rest breaks.    Time 8    Period Weeks    Status Achieved    Target Date 07/07/21      PT LONG TERM GOAL #4   Title Pt. able to stand from normal chair with no UE assist to improve safety/independence with transfers.     Baseline Unable to stand from standard chair without heavy UE assist. 10/6, pt unable to stand from standard chair without UE support.  11/10: benefits from 1 UE assist    2/9: Pt. can rise from normal chair with 1 UE assist and must control descent with UE assist.; 8/10: Pt requires 2 UE for standing from standard chair height.; 10/5: Requires single UE to stand. 11/18/20: Pt requires B UE support.  04/28/21: Pt requires single UE support 07/07/2021: Pt continues to require R UE, unable to complete without UE when attempted.    Time 8    Period Weeks    Status Partially Met    Target Date 04/07/22      PT LONG TERM GOAL #5   Title      PT LONG TERM GOAL #8   Title Pt will demonstrate ability to stand from a lowered surface below 18" with single Ue support to demonstrate improved BLE strength to improve transfers from toilet.    Baseline 10/5: Able to stand from 18" chair with single UE support with use of momentum. 11/18/20: able to after 3 attempts. 10/11: Able to complete after 2 attempts with single UE assist.    Time 12    Period Weeks    Status Partially Met    Target Date 04/07/22              Plan -     Clinical Impression Statement PT tx. Focus on  LE strengthening/ standing posture correction and dynamic activities.  Pt. Challenged with functional reaching/ tandem stance in //-bars.  Pt. Requires CGA for safety to ensure safety.  Pt. Completes Nustep and ambulates to car with no  rest break required.  No recent LOB reported at home or in PT clinic.  Pt. Will continue to benefit from skilled PT services to increase LE muscle strength to improve balance/ safety with gait while using rollator.     Personal Factors and Comorbidities Age;Comorbidity 3+    Examination-Activity Limitations Toileting;Stand;Squat;Lift;Stairs    Stability/Clinical Decision Making Evolving/Moderate complexity    Clinical Decision Making Moderate    Rehab Potential Fair    PT Frequency 1x / week    PT Duration 8 weeks    PT Treatment/Interventions Moist Heat;Functional mobility training;Therapeutic activities;Therapeutic exercise;Balance training;Patient/family education;Manual techniques;Passive range of motion;Neuromuscular re-education;Stair training;Gait training;ADLs/Self Care Home Management    PT Next Visit Plan Progress LE strengthening and balance to reduce fall risk.     PT Home Exercise Plan [email or web address]    Consulted and Agree with Plan of Care Patient;Family member/caregiver    Family Member Consulted spouse: Tora Perches, PT, DPT # [phone] 02/18/2022, 2:11 PM

## 2022-02-23 ENCOUNTER — Encounter: Payer: Self-pay | Admitting: Physical Therapy

## 2022-02-23 ENCOUNTER — Ambulatory Visit: Payer: Medicare Other | Admitting: Physical Therapy

## 2022-02-23 DIAGNOSIS — M6281 Muscle weakness (generalized): Secondary | ICD-10-CM

## 2022-02-23 DIAGNOSIS — R293 Abnormal posture: Secondary | ICD-10-CM

## 2022-02-23 DIAGNOSIS — R269 Unspecified abnormalities of gait and mobility: Secondary | ICD-10-CM

## 2022-02-23 DIAGNOSIS — R2689 Other abnormalities of gait and mobility: Secondary | ICD-10-CM

## 2022-02-23 NOTE — Therapy (Signed)
OUTPATIENT PHYSICAL THERAPY TREATMENT NOTE     Patient Name: Mike Holloway MRN: [id number] DOB:[birth year], 81 y.o., male Today's Date: 02/23/2022  PCP: [name], MD REFERRING PROVIDER: Hessie Knows, MD     PT End of Session - 02/23/22 1309     Visit Number 7    Number of Visits 12    Date for PT Re-Evaluation 04/07/22    Authorization - Visit Number 7    Authorization - Number of Visits 10    PT Start Time 3846    PT Stop Time 1406    PT Time Calculation (min) 71 min    Activity Tolerance Patient tolerated treatment well;Patient limited by pain    Behavior During Therapy [facility] for tasks assessed/performed             Past Medical History:  Diagnosis Date   Arthritis    Atrial flutter ([place])    Diabetes mellitus ([place])    Essential tremor    Essential tremor    deep brain stimulator    Hypercholesteremia    Hypertension    Incontinence    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   SIADH (syndrome of inappropriate ADH production) ([place])    Sleep apnea    Stroke [facility])    Past Surgical History:  Procedure Laterality Date   ABLATION     APPENDECTOMY     CATARACT EXTRACTION, BILATERAL     COLONOSCOPY WITH PROPOFOL N/A 03/17/2018   Procedure: COLONOSCOPY WITH PROPOFOL;  Surgeon: Jonathon Bellows, MD;  Location: [facility] 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   Procedure: FECAL TRANSPLANT;  Surgeon: Jonathon Bellows, MD;  Location: [facility] ENDOSCOPY;  Service: Gastroenterology;  Laterality: N/A;   HEMORRHOID SURGERY     HERNIA REPAIR     HIP FRACTURE SURGERY     INTRAMEDULLARY (IM) NAIL INTERTROCHANTERIC Right 09/16/2018   Procedure: INTRAMEDULLARY (IM) NAIL INTERTROCHANTRIC;  Surgeon: Hessie Knows, MD;  Location: [facility] ORS;  Service: Orthopedics;  Laterality: Right;   IR CATHETER TUBE CHANGE  11/22/2018   NASAL SINUS SURGERY     ORCHIECTOMY     TONSILLECTOMY      Patient Active Problem List   Diagnosis Date Noted   DVT femoral (deep venous thrombosis) with thrombophlebitis, left ([place]) 02/06/2020   Lymphedema 04/15/2019   Bilateral leg edema 01/25/2019   Hyponatremia 12/24/2018   SIADH (syndrome of inappropriate ADH production) ([place]) 12/24/2018   Erosion of urethra due to catheterization of urinary tract ([place]) 10/27/2018   Generalized weakness 10/14/2018   Hip fracture ([place]) 09/15/2018   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   HLD (hyperlipidemia) 03/24/2018   Contusion of right knee 02/14/2018   Bradycardia 12/28/2017   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   Tremor 09/04/2016   Chronic pain of right knee 08/10/2016   Right ankle pain 08/10/2016   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[place]) 12/11/2015   Urinary retention 09/08/2015   Lymphoma, non-Hodgkin's ([place]) 04/03/2015   Breathlessness on exertion 11/21/2014   Breath shortness 11/21/2014   Arthropathy 11/07/2014   Atrial flutter, paroxysmal ([place]) 11/07/2014   Type 2 diabetes mellitus ([place]) 11/07/2014   Benign essential tremor 11/07/2014   Benign essential HTN 11/07/2014   Mixed incontinence 11/07/2014   Hypercholesterolemia without hypertriglyceridemia 11/07/2014   Apnea, sleep 11/07/2014  Controlled type 2 diabetes mellitus without complication ([place]) 65/78/4696   Pure hypercholesterolemia 11/07/2014   Other abnormalities of gait and mobility 11/02/2011   Decreased mobility 11/02/2011   Abnormal gait 06/29/2011   Discoordination 06/29/2011    REFERRING DIAG: S/p R subtrochanteric hip fracture, S/p R ORIF fracture of hip.    THERAPY DIAG:  Muscle weakness (generalized)  Gait difficulty  Balance problem  Abnormal posture  PERTINENT HISTORY: See evaluation  PRECAUTIONS: Fall  SUBJECTIVE:  Pt. Reports having a good  weekend and was able to ascend/ descend 7 stairs at son's house.  Pts. Wife states pt. Is able to change underwear while sitting on commode independently.  Pts. Wife will be out of town next week and will use a home health company to assist with daily activities.    PAIN:  Are you having pain? Yes: 2-3/10. Pain location: R knee Pain description: achy Aggravating factors: increase walking/ stairs Relieving factors: sitting/ rest    TODAY'S TREATMENT:    02/23/22:  There.ex.:  Forward/lateral walking in //-bars with cuing to correct upright posture 4 laps.    Sit to stands from gray chair.  Standing toe/ heel raises in //-bars/ hip extension/ partial lunges/  partial squats 10x each.     Nustep L4 BLE only 10+ min. (Consistent cadence with MH to low back).   Neuro:  Standing tolerance tasks: alt. Hooks at wall ladder (limited by increase back pain)/ fishing outside of //-bars with no UE assist wt. Bearing (<5 minutes).    Walking in //-bars with turning CW/ CCW.  BOS correction with blue line.    Walking in clinic with rollator and cuing for less UE assist/ increase step pattern/ heel strike (2 laps).  Walking in hallway/ down ramp to car and managing threshold with increase hip flexion/ heel strike.  Cuing for posture correction.  No assist with rollator of threshold.     02/16/22:   Therex:   4# ankle wts.: seated marching/ LAQ/ heel raises 20x.  Walking in //-bars forward/ backwards/ lateral 3 laps each (B UE assist required and cuing for posture correction)- mirror feedback.     Nustep L4 BLE only 10+ min. (Consistent cadence with MH to low back).    Neuro:   Functional reaching (cones on elevated hospital table)- varying distances with CGA for safety.  Turning/ wt. Shifting in //-bars to L/R   Alt. Tandem stance in //-bars with focus on BOS/ upright posture.  CGA for safety.     Walking in hallway and PT gym with use of RW.  Consistent cadence and cuing to correct  posture/ step pattern at threshold changes and down outside ramp/ in parking lot.  No LOB.        PATIENT EDUCATION: Education details: Posture correction with standing ex. Person educated: Patient Education method: Customer service manager Education comprehension: verbalized understanding and returned demonstration   HOME EXERCISE PROGRAM: Access code: [email or web address]  Pt. Has a progressive standing ex. Program.  Scheduled daily walks with rollator at home.  Increase distances with son or family outside.       PT Long Term Goals -       PT LONG TERM GOAL #1   Title Pt. will ambulate 10 minutes with use of RW and SBA/mod. independence to improve walking endurance/ community ambulation.    Baseline Pt. ambulates around 5-6 mintues prior to fatigue/ increase knee/ back pain.    Time 12    Period Weeks    Status  Ongoing   Target Date 04/07/22      PT LONG TERM GOAL #2   Title Pt. will increase Berg balance test to >40 out of 56 to improve independence with gait/ decrease fall risk.    Baseline Berg: 22/56 (significant fall risk); 9/30 32/56.  11/10: 32 (limited by R knee pain)  2/9: 33/56 limited due to R knee and Low Back pain; 8/10: 29/56; 10/5: 38/56, 11/18/20: 28. 04/28/2021: 28/56 07/07/2021: 21/56.  11/29: 31/56 (limited by knee pain/ use of UE assist).  4/19: 36/56 (less back pain)   Time 12    Period Weeks    Status Partially Met    Target Date 04/07/22      PT LONG TERM GOAL #3   Title Pt. able to ambulate 100 feet with consistent 2 point gait pattern and use of least restrictive assistive devce to improve household mobility.    Baseline Pt. able to ambulate with use of RW/ CGA to min. A for safety.   Heavy use of B UE.; 9/30 pt able to ambulate with rollator with inconsistent 2 point gait pattern, heavy reliance on UE CGA/supervision  2/9: Pt. uses 2 point step pattern with walking with rollader with decreased stride length. Pt. would be limited due to pain in the knee  walking for >100 ft.; 8/10: Pt demonstrates ability to ambulate 100' with consistent 2 point gait pattern with rollator. 11/18/20: Pt able to amb 130 ft with Rollator and 2-point gait pattern. 04/28/21: Pt able to amb 148 ft with Rollator and 2-point gait pattern 07/07/2021 Pt was able to amb 300 ft with rollator and 2 point gat pattern. Walking rollator is the least restricitive AD as this time. PT required several standing rest breaks.    Time 8    Period Weeks    Status Achieved    Target Date 07/07/21      PT LONG TERM GOAL #4   Title Pt. able to stand from normal chair with no UE assist to improve safety/independence with transfers.     Baseline Unable to stand from standard chair without heavy UE assist. 10/6, pt unable to stand from standard chair without UE support.  11/10: benefits from 1 UE assist    2/9: Pt. can rise from normal chair with 1 UE assist and must control descent with UE assist.; 8/10: Pt requires 2 UE for standing from standard chair height.; 10/5: Requires single UE to stand. 11/18/20: Pt requires B UE support.  04/28/21: Pt requires single UE support 07/07/2021: Pt continues to require R UE, unable to complete without UE when attempted.    Time 8    Period Weeks    Status Partially Met    Target Date 04/07/22      PT LONG TERM GOAL #5   Title      PT LONG TERM GOAL #8   Title Pt will demonstrate ability to stand from a lowered surface below 18" with single Ue support to demonstrate improved BLE strength to improve transfers from toilet.    Baseline 10/5: Able to stand from 18" chair with single UE support with use of momentum. 11/18/20: able to after 3 attempts. 10/11: Able to complete after 2 attempts with single UE assist.    Time 12    Period Weeks    Status Partially Met    Target Date 04/07/22              Plan -     Clinical Impression  Statement PT tx. Focus on LE strengthening/ standing posture correction and dynamic activities.  Pt. Challenged with  prolonged standing tasks outside of //-bars.  Pt. Requires CGA for safety to ensure safety.  Pt. Completes Nustep and ambulates to car with no rest break required.  No recent LOB reported at home or in PT clinic.  Pt. Will continue to benefit from skilled PT services to increase LE muscle strength to improve balance/ safety with gait while using rollator.  No PT tx. Next week due to pts. Wife out of town/ no transportation to PT.     Personal Factors and Comorbidities Age;Comorbidity 3+    Examination-Activity Limitations Toileting;Stand;Squat;Lift;Stairs    Stability/Clinical Decision Making Evolving/Moderate complexity    Clinical Decision Making Moderate    Rehab Potential Fair    PT Frequency 1x / week    PT Duration 8 weeks    PT Treatment/Interventions Moist Heat;Functional mobility training;Therapeutic activities;Therapeutic exercise;Balance training;Patient/family education;Manual techniques;Passive range of motion;Neuromuscular re-education;Stair training;Gait training;ADLs/Self Care Home Management    PT Next Visit Plan Progress LE strengthening and balance to reduce fall risk.     PT Home Exercise Plan [email or web address]    Consulted and Agree with Plan of Care Patient;Family member/caregiver    Family Member Consulted spouse: Tora Perches, PT, DPT # [phone] 02/23/2022, 5:11 PM

## 2022-03-08 ENCOUNTER — Ambulatory Visit: Payer: Medicare Other | Attending: Orthopedic Surgery | Admitting: Physical Therapy

## 2022-03-08 ENCOUNTER — Encounter: Payer: Self-pay | Admitting: Physical Therapy

## 2022-03-08 DIAGNOSIS — R269 Unspecified abnormalities of gait and mobility: Secondary | ICD-10-CM | POA: Diagnosis present

## 2022-03-08 DIAGNOSIS — R293 Abnormal posture: Secondary | ICD-10-CM | POA: Insufficient documentation

## 2022-03-08 DIAGNOSIS — M6281 Muscle weakness (generalized): Secondary | ICD-10-CM | POA: Diagnosis present

## 2022-03-08 DIAGNOSIS — R2689 Other abnormalities of gait and mobility: Secondary | ICD-10-CM | POA: Insufficient documentation

## 2022-03-08 NOTE — Therapy (Signed)
OUTPATIENT PHYSICAL THERAPY TREATMENT NOTE     Patient Name: Mike Holloway MRN: [id number] DOB:Jan 26, 1941, 81 y.o., male Today's Date: 03/08/2022  PCP: [name], MD REFERRING PROVIDER: Hessie Knows, MD     PT End of Session - 03/08/22 1358     Visit Number 8    Number of Visits 12    Date for PT Re-Evaluation 04/07/22    Authorization - Visit Number 8    Authorization - Number of Visits 10    PT Start Time 0762    Activity Tolerance Patient tolerated treatment well;Patient limited by pain    Behavior During Therapy [facility] for tasks assessed/performed             Past Medical History:  Diagnosis Date   Arthritis    Atrial flutter ([place])    Diabetes mellitus ([place])    Essential tremor    Essential tremor    deep brain stimulator    Hypercholesteremia    Hypertension    Incontinence    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   SIADH (syndrome of inappropriate ADH production) ([place])    Sleep apnea    Stroke [facility])    Past Surgical History:  Procedure Laterality Date   ABLATION     APPENDECTOMY     CATARACT EXTRACTION, BILATERAL     COLONOSCOPY WITH PROPOFOL N/A 03/17/2018   Procedure: COLONOSCOPY WITH PROPOFOL;  Surgeon: Jonathon Bellows, MD;  Location: [facility] 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   Procedure: FECAL TRANSPLANT;  Surgeon: Jonathon Bellows, MD;  Location: [facility] ENDOSCOPY;  Service: Gastroenterology;  Laterality: N/A;   HEMORRHOID SURGERY     HERNIA REPAIR     HIP FRACTURE SURGERY     INTRAMEDULLARY (IM) NAIL INTERTROCHANTERIC Right 09/16/2018   Procedure: INTRAMEDULLARY (IM) NAIL INTERTROCHANTRIC;  Surgeon: Hessie Knows, MD;  Location: [facility] ORS;  Service: Orthopedics;  Laterality: Right;   IR CATHETER TUBE CHANGE  11/22/2018   NASAL SINUS SURGERY     ORCHIECTOMY     TONSILLECTOMY     Patient Active Problem List   Diagnosis Date Noted   DVT  femoral (deep venous thrombosis) with thrombophlebitis, left ([place]) 02/06/2020   Lymphedema 04/15/2019   Bilateral leg edema 01/25/2019   Hyponatremia 12/24/2018   SIADH (syndrome of inappropriate ADH production) ([place]) 12/24/2018   Erosion of urethra due to catheterization of urinary tract ([place]) 10/27/2018   Generalized weakness 10/14/2018   Hip fracture ([place]) 09/15/2018   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   HLD (hyperlipidemia) 03/24/2018   Contusion of right knee 02/14/2018   Bradycardia 12/28/2017   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   Tremor 09/04/2016   Chronic pain of right knee 08/10/2016   Right ankle pain 08/10/2016   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   Non-Hodgkin's lymphoma ([place]) 12/11/2015   Urinary retention 09/08/2015   Lymphoma, non-Hodgkin's ([place]) 04/03/2015   Breathlessness on exertion 11/21/2014   Breath shortness 11/21/2014   Arthropathy 11/07/2014   Atrial flutter, paroxysmal ([place]) 11/07/2014   Type 2 diabetes mellitus ([place]) 11/07/2014   Benign essential tremor 11/07/2014   Benign essential HTN 11/07/2014   Mixed incontinence 11/07/2014   Hypercholesterolemia without hypertriglyceridemia 11/07/2014   Apnea, sleep 11/07/2014   Controlled type 2 diabetes mellitus without complication ([place]) 26/33/3545   Pure hypercholesterolemia 11/07/2014  Other abnormalities of gait and mobility 11/02/2011   Decreased mobility 11/02/2011   Abnormal gait 06/29/2011   Discoordination 06/29/2011    REFERRING DIAG: S/p R subtrochanteric hip fracture, S/p R ORIF fracture of hip.    THERAPY DIAG:  Muscle weakness (generalized)  Gait difficulty  Balance problem  Abnormal posture  PERTINENT HISTORY: See evaluation  PRECAUTIONS: Fall  SUBJECTIVE:  Pts. Wife was out of town last Tuesday to Friday and pt. Had a home health company assist with  care.  No new issues reported.      PAIN:  Are you having pain? Yes: 3/10. Pain location: R knee Pain description: achy Aggravating factors: increase walking/ stairs Relieving factors: sitting/ rest    TODAY'S TREATMENT:    03/08/22:  There.ex.:  Forward/lateral walking (4# ankle wts) in //-bars with cuing to correct upright posture 4 laps.     Seated 4# ankle wts.: LAQ/ marching/ heel raises 20x.    Sit to stands from gray chair with UE assist and proper mechanics 10x.  Nustep L4 BLE only 10+ min. (Consistent cadence with MH to low back).    Neuro:  Walking at agility ladder with rollator working on consistent step length (heel strike).  Using blue lines to guidance.    Prolonged standing tasks at basketball hoop.  3 times for average of 2 min. 10 sec. Each.  Pt. Limited by increase low back pain requiring a seated rest break between reps.     Walking in gym/clinic with rollator and cuing for less UE assist/ increase step pattern/ heel strike (2 laps).  Walking in hallway/ down ramp to car and managing threshold with increase hip flexion/ heel strike.  Cuing for posture correction.  No assist with rollator at threshold.        PATIENT EDUCATION: Education details: Posture correction with standing ex. Person educated: Patient Education method: Customer service manager Education comprehension: verbalized understanding and returned demonstration   HOME EXERCISE PROGRAM: Access code: [email or web address]  Pt. Has a progressive standing ex. Program.  Scheduled daily walks with rollator at home.  Increase distances with son or family outside.       PT Long Term Goals -       PT LONG TERM GOAL #1   Title Pt. will ambulate 10 minutes with use of RW and SBA/mod. independence to improve walking endurance/ community ambulation.    Baseline Pt. ambulates around 5-6 mintues prior to fatigue/ increase knee/ back pain.    Time 12    Period Weeks    Status Ongoing   Target Date  04/07/22      PT LONG TERM GOAL #2   Title Pt. will increase Berg balance test to >40 out of 56 to improve independence with gait/ decrease fall risk.    Baseline Berg: 22/56 (significant fall risk); 9/30 32/56.  11/10: 32 (limited by R knee pain)  2/9: 33/56 limited due to R knee and Low Back pain; 8/10: 29/56; 10/5: 38/56, 11/18/20: 28. 04/28/2021: 28/56 07/07/2021: 21/56.  11/29: 31/56 (limited by knee pain/ use of UE assist).  4/19: 36/56 (less back pain)   Time 12    Period Weeks    Status Partially Met    Target Date 04/07/22      PT LONG TERM GOAL #3   Title Pt. able to ambulate 100 feet with consistent 2 point gait pattern and use of least restrictive assistive devce to improve household mobility.    Baseline Pt. able to ambulate with  use of RW/ CGA to min. A for safety.   Heavy use of B UE.; 9/30 pt able to ambulate with rollator with inconsistent 2 point gait pattern, heavy reliance on UE CGA/supervision  2/9: Pt. uses 2 point step pattern with walking with rollader with decreased stride length. Pt. would be limited due to pain in the knee walking for >100 ft.; 8/10: Pt demonstrates ability to ambulate 100' with consistent 2 point gait pattern with rollator. 11/18/20: Pt able to amb 130 ft with Rollator and 2-point gait pattern. 04/28/21: Pt able to amb 148 ft with Rollator and 2-point gait pattern 07/07/2021 Pt was able to amb 300 ft with rollator and 2 point gat pattern. Walking rollator is the least restricitive AD as this time. PT required several standing rest breaks.    Time 8    Period Weeks    Status Achieved    Target Date 07/07/21      PT LONG TERM GOAL #4   Title Pt. able to stand from normal chair with no UE assist to improve safety/independence with transfers.     Baseline Unable to stand from standard chair without heavy UE assist. 10/6, pt unable to stand from standard chair without UE support.  11/10: benefits from 1 UE assist    2/9: Pt. can rise from normal chair with 1 UE  assist and must control descent with UE assist.; 8/10: Pt requires 2 UE for standing from standard chair height.; 10/5: Requires single UE to stand. 11/18/20: Pt requires B UE support.  04/28/21: Pt requires single UE support 07/07/2021: Pt continues to require R UE, unable to complete without UE when attempted.    Time 8    Period Weeks    Status Partially Met    Target Date 04/07/22      PT LONG TERM GOAL #5   Title      PT LONG TERM GOAL #8   Title Pt will demonstrate ability to stand from a lowered surface below 18" with single Ue support to demonstrate improved BLE strength to improve transfers from toilet.    Baseline 10/5: Able to stand from 18" chair with single UE support with use of momentum. 11/18/20: able to after 3 attempts. 10/11: Able to complete after 2 attempts with single UE assist.    Time 12    Period Weeks    Status Partially Met    Target Date 04/07/22              Plan -     Clinical Impression Statement Pt. Challenged with prolonged standing tasks during basketball task with an average standing time of 2 minutes and 10 sec.  Pt. Limited by increase in low back pain/ fatigue.  Pts. Pain resolves quickly with a short seated rest break.   Pt. Requires CGA for safety to ensure safety when working on dynamic/ walking tasks.  Pt. Completes Nustep and ambulates to car with no rest break required.  No recent LOB reported at home or in PT clinic.  Pt. Will continue to benefit from skilled PT services to increase LE muscle strength to improve balance/ safety with gait while using rollator.     Personal Factors and Comorbidities Age;Comorbidity 3+    Examination-Activity Limitations Toileting;Stand;Squat;Lift;Stairs    Stability/Clinical Decision Making Evolving/Moderate complexity    Clinical Decision Making Moderate    Rehab Potential Fair    PT Frequency 1x / week    PT Duration 8 weeks    PT  Treatment/Interventions Moist Heat;Functional mobility training;Therapeutic  activities;Therapeutic exercise;Balance training;Patient/family education;Manual techniques;Passive range of motion;Neuromuscular re-education;Stair training;Gait training;ADLs/Self Care Home Management    PT Next Visit Plan Progress LE strengthening and balance to reduce fall risk.     PT Home Exercise Plan [email or web address]    Consulted and Agree with Plan of Care Patient;Family member/caregiver    Family Member Consulted spouse: Tora Perches, PT, DPT # [phone] 03/08/2022, 3:11 PM

## 2022-03-15 ENCOUNTER — Ambulatory Visit: Payer: Medicare Other | Admitting: Physical Therapy

## 2022-03-15 ENCOUNTER — Encounter: Payer: Self-pay | Admitting: Physical Therapy

## 2022-03-15 DIAGNOSIS — M6281 Muscle weakness (generalized): Secondary | ICD-10-CM | POA: Diagnosis not present

## 2022-03-15 DIAGNOSIS — R2689 Other abnormalities of gait and mobility: Secondary | ICD-10-CM

## 2022-03-15 DIAGNOSIS — R293 Abnormal posture: Secondary | ICD-10-CM

## 2022-03-15 DIAGNOSIS — R269 Unspecified abnormalities of gait and mobility: Secondary | ICD-10-CM

## 2022-03-15 NOTE — Therapy (Signed)
OUTPATIENT PHYSICAL THERAPY TREATMENT NOTE     Patient Name: Mike Holloway MRN: [id number] DOB:[birth year], 81 y.o., male Today's Date: 03/15/2022  PCP: [name], MD REFERRING PROVIDER: Hessie Knows, MD     PT End of Session - 03/15/22 1355     Visit Number 9    Number of Visits 12    Date for PT Re-Evaluation 04/07/22    Authorization - Visit Number 9    Authorization - Number of Visits 10    PT Start Time 1655    PT Stop Time 1449    PT Time Calculation (min) 64 min    Activity Tolerance Patient tolerated treatment well;Patient limited by pain    Behavior During Therapy [facility] for tasks assessed/performed             Past Medical History:  Diagnosis Date   Arthritis    Atrial flutter ([place])    Diabetes mellitus ([place])    Essential tremor    Essential tremor    deep brain stimulator    Hypercholesteremia    Hypertension    Incontinence    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   SIADH (syndrome of inappropriate ADH production) ([place])    Sleep apnea    Stroke [facility])    Past Surgical History:  Procedure Laterality Date   ABLATION     APPENDECTOMY     CATARACT EXTRACTION, BILATERAL     COLONOSCOPY WITH PROPOFOL N/A 03/17/2018   Procedure: COLONOSCOPY WITH PROPOFOL;  Surgeon: Jonathon Bellows, MD;  Location: [facility] 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   Procedure: FECAL TRANSPLANT;  Surgeon: Jonathon Bellows, MD;  Location: [facility] ENDOSCOPY;  Service: Gastroenterology;  Laterality: N/A;   HEMORRHOID SURGERY     HERNIA REPAIR     HIP FRACTURE SURGERY     INTRAMEDULLARY (IM) NAIL INTERTROCHANTERIC Right 09/16/2018   Procedure: INTRAMEDULLARY (IM) NAIL INTERTROCHANTRIC;  Surgeon: Hessie Knows, MD;  Location: [facility] ORS;  Service: Orthopedics;  Laterality: Right;   IR CATHETER TUBE CHANGE  11/22/2018   NASAL SINUS SURGERY     ORCHIECTOMY     TONSILLECTOMY      Patient Active Problem List   Diagnosis Date Noted   DVT femoral (deep venous thrombosis) with thrombophlebitis, left ([place]) 02/06/2020   Lymphedema 04/15/2019   Bilateral leg edema 01/25/2019   Hyponatremia 12/24/2018   SIADH (syndrome of inappropriate ADH production) ([place]) 12/24/2018   Erosion of urethra due to catheterization of urinary tract ([place]) 10/27/2018   Generalized weakness 10/14/2018   Hip fracture ([place]) 09/15/2018   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   HLD (hyperlipidemia) 03/24/2018   Contusion of right knee 02/14/2018   Bradycardia 12/28/2017   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   Tremor 09/04/2016   Chronic pain of right knee 08/10/2016   Right ankle pain 08/10/2016   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[place]) 12/11/2015   Urinary retention 09/08/2015   Lymphoma, non-Hodgkin's ([place]) 04/03/2015   Breathlessness on exertion 11/21/2014   Breath shortness 11/21/2014   Arthropathy 11/07/2014   Atrial flutter, paroxysmal ([place]) 11/07/2014   Type 2 diabetes mellitus ([place]) 11/07/2014   Benign essential tremor 11/07/2014   Benign essential HTN 11/07/2014   Mixed incontinence 11/07/2014   Hypercholesterolemia without hypertriglyceridemia 11/07/2014   Apnea, sleep 11/07/2014  Controlled type 2 diabetes mellitus without complication ([place]) 92/95/7473   Pure hypercholesterolemia 11/07/2014   Other abnormalities of gait and mobility 11/02/2011   Decreased mobility 11/02/2011   Abnormal gait 06/29/2011   Discoordination 06/29/2011    REFERRING DIAG: S/p R subtrochanteric hip fracture, S/p R ORIF fracture of hip.    THERAPY DIAG:  Muscle weakness (generalized)  Gait difficulty  Balance problem  Abnormal posture  PERTINENT HISTORY: See evaluation  PRECAUTIONS: Fall  SUBJECTIVE:  Pts. Arrived to PT with nursing  caregiver secondary to wife feeling ill.  Pt. States he walked this weekend and went to son's house for father's day.       PAIN:  Are you having pain? Yes: 2-3/10. Pain location: R knee Pain description: achy Aggravating factors: increase walking/ stairs Relieving factors: sitting/ rest    TODAY'S TREATMENT:    03/15/22:  There.ex.:   4# ankle wts.: walking in //-bars forward/ lateral 5 laps each.  Seated marching/ LAQ/ heel and toe raises 20x each.    Nustep L4 BLE only 10+ min. (Consistent cadence with MH to low back).  Seated B shoulder flexion with wt. wand 20x./ standing B shoulder chest press with wt. Wand 20x.  SBA for safety.  No LOB but posterior lean noted/ corrected.    Neuro:  Standing ball toss in //-bars:  10x3 with yellow ball.  CGA for safety secondary to 2 LOB (posterior) requiring UE assist on //-bars to correct.  Pt. Challenged with prolonged standing >2 minutes each time.  Increase LBP.    Standing with modified tandem stance (no UE) in //-bars.  Turning in //-bars with light UE assist required due to knee pain/ balance issues.   Walking in PT clinic/ hallway and outside with use of rollator required.  Minimal cuing for posture correction/ BOS.      03/08/22:  There.ex.:  Forward/lateral walking (4# ankle wts) in //-bars with cuing to correct upright posture 4 laps.     Seated 4# ankle wts.: LAQ/ marching/ heel raises 20x.    Sit to stands from gray chair with UE assist and proper mechanics 10x.  Nustep L4 BLE only 10+ min. (Consistent cadence with MH to low back).    Neuro:  Walking at agility ladder with rollator working on consistent step length (heel strike).  Using blue lines to guidance.    Prolonged standing tasks at basketball hoop.  3 times for average of 2 min. 10 sec. Each.  Pt. Limited by increase low back pain requiring a seated rest break between reps.     Walking in gym/clinic with rollator and cuing for less UE assist/ increase  step pattern/ heel strike (2 laps).  Walking in hallway/ down ramp to car and managing threshold with increase hip flexion/ heel strike.  Cuing for posture correction.  No assist with rollator at threshold.        PATIENT EDUCATION: Education details: Posture correction with standing ex. Person educated: Patient Education method: Customer service manager Education comprehension: verbalized understanding and returned demonstration   HOME EXERCISE PROGRAM: Access code: [email or web address]  Pt. Has a progressive standing ex. Program.  Scheduled daily walks with rollator at home.  Increase distances with son or family outside.       PT Long Term Goals -       PT LONG TERM GOAL #1   Title Pt. will ambulate 10 minutes with use of RW and SBA/mod. independence to improve walking endurance/ community ambulation.    Baseline  Pt. ambulates around 5-6 mintues prior to fatigue/ increase knee/ back pain.    Time 12    Period Weeks    Status Ongoing   Target Date 04/07/22      PT LONG TERM GOAL #2   Title Pt. will increase Berg balance test to >40 out of 56 to improve independence with gait/ decrease fall risk.    Baseline Berg: 22/56 (significant fall risk); 9/30 32/56.  11/10: 32 (limited by R knee pain)  2/9: 33/56 limited due to R knee and Low Back pain; 8/10: 29/56; 10/5: 38/56, 11/18/20: 28. 04/28/2021: 28/56 07/07/2021: 21/56.  11/29: 31/56 (limited by knee pain/ use of UE assist).  4/19: 36/56 (less back pain)   Time 12    Period Weeks    Status Partially Met    Target Date 04/07/22      PT LONG TERM GOAL #3   Title Pt. able to ambulate 100 feet with consistent 2 point gait pattern and use of least restrictive assistive devce to improve household mobility.    Baseline Pt. able to ambulate with use of RW/ CGA to min. A for safety.   Heavy use of B UE.; 9/30 pt able to ambulate with rollator with inconsistent 2 point gait pattern, heavy reliance on UE CGA/supervision  2/9: Pt. uses 2 point  step pattern with walking with rollader with decreased stride length. Pt. would be limited due to pain in the knee walking for >100 ft.; 8/10: Pt demonstrates ability to ambulate 100' with consistent 2 point gait pattern with rollator. 11/18/20: Pt able to amb 130 ft with Rollator and 2-point gait pattern. 04/28/21: Pt able to amb 148 ft with Rollator and 2-point gait pattern 07/07/2021 Pt was able to amb 300 ft with rollator and 2 point gat pattern. Walking rollator is the least restricitive AD as this time. PT required several standing rest breaks.    Time 8    Period Weeks    Status Achieved    Target Date 07/07/21      PT LONG TERM GOAL #4   Title Pt. able to stand from normal chair with no UE assist to improve safety/independence with transfers.     Baseline Unable to stand from standard chair without heavy UE assist. 10/6, pt unable to stand from standard chair without UE support.  11/10: benefits from 1 UE assist    2/9: Pt. can rise from normal chair with 1 UE assist and must control descent with UE assist.; 8/10: Pt requires 2 UE for standing from standard chair height.; 10/5: Requires single UE to stand. 11/18/20: Pt requires B UE support.  04/28/21: Pt requires single UE support 07/07/2021: Pt continues to require R UE, unable to complete without UE when attempted.    Time 8    Period Weeks    Status Partially Met    Target Date 04/07/22      PT LONG TERM GOAL #5   Title      PT LONG TERM GOAL #8   Title Pt will demonstrate ability to stand from a lowered surface below 18" with single Ue support to demonstrate improved BLE strength to improve transfers from toilet.    Baseline 10/5: Able to stand from 18" chair with single UE support with use of momentum. 11/18/20: able to after 3 attempts. 10/11: Able to complete after 2 attempts with single UE assist.    Time 12    Period Weeks    Status Partially Met  Target Date 04/07/22              Plan -     Clinical Impression Statement  Pt. Challenged with prolonged standing tasks during ball toss in //-bars.  Pt. Requires CGA for safety with higher level dynamic tasks.  Pt. Limited by increase in low back pain/ fatigue.  Pts. Pain resolves quickly with a short seated rest break.   Pt. Highly motivated and works hard during there.ex./ dynamic tasks.  Pt. Completes Nustep and ambulates to car with no rest break required.  Pt. Benefits from //-bars/ light UE assist with any posterior LOB during dynamic tasks. Pt. Will continue to benefit from skilled PT services to increase LE muscle strength to improve balance/ safety with gait while using rollator.     Personal Factors and Comorbidities Age;Comorbidity 3+    Examination-Activity Limitations Toileting;Stand;Squat;Lift;Stairs    Stability/Clinical Decision Making Evolving/Moderate complexity    Clinical Decision Making Moderate    Rehab Potential Fair    PT Frequency 1x / week    PT Duration 8 weeks    PT Treatment/Interventions Moist Heat;Functional mobility training;Therapeutic activities;Therapeutic exercise;Balance training;Patient/family education;Manual techniques;Passive range of motion;Neuromuscular re-education;Stair training;Gait training;ADLs/Self Care Home Management    PT Next Visit Plan Progress LE strengthening and balance to reduce fall risk.   10th visit progress noted next tx. Session.    PT Home Exercise Plan [email or web address]    Consulted and Agree with Plan of Care Patient;Family member/caregiver    Family Member Consulted spouse: Tora Perches, PT, DPT # [phone] 03/15/2022, 6:17 PM

## 2022-03-16 ENCOUNTER — Ambulatory Visit: Payer: Medicare Other | Admitting: Physician Assistant

## 2022-03-22 ENCOUNTER — Ambulatory Visit: Payer: Medicare Other | Admitting: Physical Therapy

## 2022-03-22 DIAGNOSIS — M6281 Muscle weakness (generalized): Secondary | ICD-10-CM | POA: Diagnosis not present

## 2022-03-22 DIAGNOSIS — R269 Unspecified abnormalities of gait and mobility: Secondary | ICD-10-CM

## 2022-03-22 DIAGNOSIS — R293 Abnormal posture: Secondary | ICD-10-CM

## 2022-03-22 DIAGNOSIS — R2689 Other abnormalities of gait and mobility: Secondary | ICD-10-CM

## 2022-03-23 ENCOUNTER — Encounter: Payer: Self-pay | Admitting: Physical Therapy

## 2022-03-26 ENCOUNTER — Encounter: Payer: Self-pay | Admitting: Physician Assistant

## 2022-03-26 ENCOUNTER — Ambulatory Visit (INDEPENDENT_AMBULATORY_CARE_PROVIDER_SITE_OTHER): Payer: Medicare Other | Admitting: Physician Assistant

## 2022-03-26 VITALS — BP 131/70 | HR 79 | Ht 68.0 in | Wt 223.0 lb

## 2022-03-26 DIAGNOSIS — Z466 Encounter for fitting and adjustment of urinary device: Secondary | ICD-10-CM

## 2022-03-26 DIAGNOSIS — R339 Retention of urine, unspecified: Secondary | ICD-10-CM | POA: Diagnosis not present

## 2022-03-26 NOTE — Progress Notes (Signed)
Cath Change/ Replacement  Patient is present today for a catheter change due to urinary retention.  18m of water was removed from the balloon, a 16FR coude foley cath was removed without difficulty.  Patient was cleaned and prepped in a sterile fashion with betadine and 2% lidocaine jelly was instilled into the urethra. A 16 FR coude foley cath was replaced into the bladder no complications were noted Urine return was noted 146mand urine was yellow in color. The balloon was filled with 10106mf sterile water. A leg bag was attached for drainage.  Patient tolerated well.    Performed by: SamDebroah LoopA-C   Follow up: Return in about 1 month (around 04/25/2022) for catheter exchange .

## 2022-03-29 ENCOUNTER — Encounter: Payer: Self-pay | Admitting: Physical Therapy

## 2022-03-29 ENCOUNTER — Ambulatory Visit: Payer: Medicare Other | Attending: Orthopedic Surgery | Admitting: Physical Therapy

## 2022-03-29 DIAGNOSIS — R471 Dysarthria and anarthria: Secondary | ICD-10-CM | POA: Insufficient documentation

## 2022-03-29 DIAGNOSIS — R2689 Other abnormalities of gait and mobility: Secondary | ICD-10-CM | POA: Diagnosis present

## 2022-03-29 DIAGNOSIS — M6281 Muscle weakness (generalized): Secondary | ICD-10-CM | POA: Insufficient documentation

## 2022-03-29 DIAGNOSIS — R293 Abnormal posture: Secondary | ICD-10-CM | POA: Insufficient documentation

## 2022-03-29 DIAGNOSIS — R1312 Dysphagia, oropharyngeal phase: Secondary | ICD-10-CM | POA: Insufficient documentation

## 2022-03-29 DIAGNOSIS — R269 Unspecified abnormalities of gait and mobility: Secondary | ICD-10-CM | POA: Diagnosis present

## 2022-03-29 NOTE — Therapy (Signed)
OUTPATIENT PHYSICAL THERAPY TREATMENT NOTE/DISCHARGE      Patient Name: Mike Holloway MRN: [id number] DOB:Jul 04, 1941, 81 y.o., male Today's Date: 03/29/2022  PCP: Mike Hartigan, MD REFERRING PROVIDER: Hessie Knows, MD     PT End of Session - 03/29/22 1348     Visit Number 11    Number of Visits 12    Date for PT Re-Evaluation 04/07/22    Authorization - Visit Number 1    Authorization - Number of Visits 10    PT Start Time 1344    PT Stop Time 1509    PT Time Calculation (min) 85 min    Activity Tolerance Patient tolerated treatment well;Patient limited by pain    Behavior During Therapy [facility] for tasks assessed/performed             Past Medical History:  Diagnosis Date   Arthritis    Atrial flutter ([place])    Diabetes mellitus ([place])    Essential tremor    Essential tremor    deep brain stimulator    Hypercholesteremia    Hypertension    Incontinence    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   SIADH (syndrome of inappropriate ADH production) ([place])    Sleep apnea    Stroke [facility])    Past Surgical History:  Procedure Laterality Date   ABLATION     APPENDECTOMY     CATARACT EXTRACTION, BILATERAL     COLONOSCOPY WITH PROPOFOL N/A 03/17/2018   Procedure: COLONOSCOPY WITH PROPOFOL;  Surgeon: Mike Bellows, MD;  Location: [facility] 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   Procedure: FECAL TRANSPLANT;  Surgeon: Mike Bellows, MD;  Location: [facility] ENDOSCOPY;  Service: Gastroenterology;  Laterality: N/A;   HEMORRHOID SURGERY     HERNIA REPAIR     HIP FRACTURE SURGERY     INTRAMEDULLARY (IM) NAIL INTERTROCHANTERIC Right 09/16/2018   Procedure: INTRAMEDULLARY (IM) NAIL INTERTROCHANTRIC;  Surgeon: Mike Knows, MD;  Location: [facility] ORS;  Service: Orthopedics;  Laterality: Right;   IR CATHETER TUBE CHANGE  11/22/2018   NASAL SINUS SURGERY     ORCHIECTOMY      TONSILLECTOMY     Patient Active Problem List   Diagnosis Date Noted   DVT femoral (deep venous thrombosis) with thrombophlebitis, left ([place]) 02/06/2020   Lymphedema 04/15/2019   Bilateral leg edema 01/25/2019   Hyponatremia 12/24/2018   SIADH (syndrome of inappropriate ADH production) ([place]) 12/24/2018   Erosion of urethra due to catheterization of urinary tract ([place]) 10/27/2018   Generalized weakness 10/14/2018   Hip fracture ([place]) 09/15/2018   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   HLD (hyperlipidemia) 03/24/2018   Contusion of right knee 02/14/2018   Bradycardia 12/28/2017   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   Tremor 09/04/2016   Chronic pain of right knee 08/10/2016   Right ankle pain 08/10/2016   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   Non-Hodgkin's lymphoma ([place]) 12/11/2015   Urinary retention 09/08/2015   Lymphoma, non-Hodgkin's ([place]) 04/03/2015   Breathlessness on exertion 11/21/2014   Breath shortness 11/21/2014   Arthropathy 11/07/2014   Atrial flutter, paroxysmal ([place]) 11/07/2014   Type 2 diabetes mellitus ([place]) 11/07/2014   Benign essential tremor 11/07/2014   Benign essential HTN 11/07/2014   Mixed incontinence 11/07/2014   Hypercholesterolemia without hypertriglyceridemia 11/07/2014   Apnea, sleep 11/07/2014  Controlled type 2 diabetes mellitus without complication ([place]) 41/96/2229   Pure hypercholesterolemia 11/07/2014   Other abnormalities of gait and mobility 11/02/2011   Decreased mobility 11/02/2011   Abnormal gait 06/29/2011   Discoordination 06/29/2011    REFERRING DIAG: S/p R subtrochanteric hip fracture, S/p R ORIF fracture of hip.    THERAPY DIAG:  Muscle weakness (generalized)  Gait difficulty  Balance problem  Abnormal posture  PERTINENT HISTORY: See evaluation  PRECAUTIONS: Fall  SUBJECTIVE:  Pt. Entered PT  use of rollator and consistent step pattern.  No new complaints.  Pts. R knee and low back pain remains persistent with increase standing/ walking tasks.   PAIN:  Are you having pain? Yes: 2-3/10. Pain location: R knee Pain description: achy Aggravating factors: increase walking/ stairs Relieving factors: sitting/ rest    TODAY'S TREATMENT:     03/29/22:   Sit to stands from blue mat table with focus on quad muscle/ knee extension in upright standing 10x.  Varying heights (lowest height of 18.5") without use of UE.  Goal met  Seated B shoulder flexion with wt. wand 20x.  Duing seated rest break after walking task (4 min. 10 sec.)- marked increase in back pain.   Reviewed HEP  Nustep L4 BLE only 10+ min. (Consistent cadence with MH to low back).  Neuro.mm.:  Standing ball toss in //-bars:  CGA for safety and pt. challenged with prolonged standing >2 minutes each time.  Increase LBP.    Berg balance test 36/56.  Minimal change noted.   Walking in PT clinic/ hallway for 4 min. 10 sec. With use of rollator.  Pt. Requested to sit due to back pain. Walking outside with use of rollator/ down ramp at side of building to car.  Minimal cuing for posture correction/ BOS.       PATIENT EDUCATION: Education details: Posture correction with standing ex. Person educated: Patient Education method: Customer service manager Education comprehension: verbalized understanding and returned demonstration   HOME EXERCISE PROGRAM: Access code: [email or web address]  Pt. Has a progressive standing ex. Program.  Scheduled daily walks with rollator at home.  Increase distances with son or family outside.       PT Long Term Goals -       PT LONG TERM GOAL #1   Title Pt. will ambulate 10 minutes with use of RW and SBA/mod. independence to improve walking endurance/ community ambulation.    Baseline Pt. ambulates around 5-6 mintues prior to fatigue/ increase knee/ back pain.  7/3: 4 min. max   Time 12     Period Weeks    Status Not met    Target Date 03/29/22      PT LONG TERM GOAL #2   Title Pt. will increase Berg balance test to >40 out of 56 to improve independence with gait/ decrease fall risk.    Baseline Berg: 22/56 (significant fall risk); 9/30 32/56.  11/10: 32 (limited by R knee pain)  2/9: 33/56 limited due to R knee and Low Back pain; 8/10: 29/56; 10/5: 38/56, 11/18/20: 28. 04/28/2021: 28/56 07/07/2021: 21/56.  11/29: 31/56 (limited by knee pain/ use of UE assist).  4/19: 36/56 (less back pain).  7/3: 36/56   Time 12    Period Weeks    Status Partially Met    Target Date 03/29/22      PT LONG TERM GOAL #3   Title Pt. able to ambulate 100 feet with consistent 2 point gait pattern and use of least restrictive  assistive devce to improve household mobility.    Baseline Pt. able to ambulate with use of RW/ CGA to min. A for safety.   Heavy use of B UE.; 9/30 pt able to ambulate with rollator with inconsistent 2 point gait pattern, heavy reliance on UE CGA/supervision  2/9: Pt. uses 2 point step pattern with walking with rollader with decreased stride length. Pt. would be limited due to pain in the knee walking for >100 ft.; 8/10: Pt demonstrates ability to ambulate 100' with consistent 2 point gait pattern with rollator. 11/18/20: Pt able to amb 130 ft with Rollator and 2-point gait pattern. 04/28/21: Pt able to amb 148 ft with Rollator and 2-point gait pattern 07/07/2021 Pt was able to amb 300 ft with rollator and 2 point gat pattern. Walking rollator is the least restricitive AD as this time. PT required several standing rest breaks.    Time 8    Period Weeks    Status Achieved    Target Date 07/07/21      PT LONG TERM GOAL #4   Title Pt. able to stand from normal chair with no UE assist to improve safety/independence with transfers.     Baseline Unable to stand from standard chair without heavy UE assist. 10/6, pt unable to stand from standard chair without UE support.  11/10: benefits from  1 UE assist    2/9: Pt. can rise from normal chair with 1 UE assist and must control descent with UE assist.; 8/10: Pt requires 2 UE for standing from standard chair height.; 10/5: Requires single UE to stand. 11/18/20: Pt requires B UE support.  04/28/21: Pt requires single UE support 07/07/2021: Pt continues to require R UE, unable to complete without UE when attempted.  7/3: No UE assist requires on gray chair.     Time 8    Period Weeks    Status Goal Met    Target Date 03/29/22               PT LONG TERM GOAL #5   Title Pt will demonstrate ability to stand from a lowered surface below 18" with single Ue support to demonstrate improved BLE strength to improve transfers from toilet.    Baseline 10/5: Able to stand from 18" chair with single UE support with use of momentum. 11/18/20: able to after 3 attempts. 10/11: Able to complete after 2 attempts with single UE assist.  7/3: 10x at 18" with no UE assist on blue mat table.     Time 12    Period Weeks    Status Goal Met    Target Date 03/29/22              Plan -     Clinical Impression Statement Discharge from PT at this time with focus on daily walking/ HEP.  Pt. Instructed to contact PT if any change/ regression in progress or questions.  Pt. Remains limited by increase in low back pain/ fatigue with prolonged walking and standing tasks.  Good safety awareness and LE muscle control with walking in hallway.  Pt. Requires extra time and presents with step to gait pattern while using rollator while walking down ramp outside.   Pts. Pain resolves quickly with a short seated rest break.      Personal Factors and Comorbidities Age;Comorbidity 3+    Examination-Activity Limitations Toileting;Stand;Squat;Lift;Stairs    Stability/Clinical Decision Making Evolving/Moderate complexity    Clinical Decision Making Moderate    Rehab Potential Fair  PT Frequency 1x / week    PT Duration 8 weeks    PT Treatment/Interventions Moist Heat;Functional  mobility training;Therapeutic activities;Therapeutic exercise;Balance training;Patient/family education;Manual techniques;Passive range of motion;Neuromuscular re-education;Stair training;Gait training;ADLs/Self Care Home Management    PT Next Visit Plan Discharge   PT Home Exercise Plan [facility]    Consulted and Agree with Plan of Care Patient;Family member/caregiver    Family Member Consulted spouse: Mike Holloway, PT, DPT # [id number] 03/30/2022, 10:17 AM

## 2022-04-15 ENCOUNTER — Encounter: Payer: Self-pay | Admitting: Speech Pathology

## 2022-04-15 ENCOUNTER — Telehealth: Payer: Self-pay | Admitting: Speech Pathology

## 2022-04-15 ENCOUNTER — Ambulatory Visit: Payer: Medicare Other | Admitting: Speech Pathology

## 2022-04-15 DIAGNOSIS — R471 Dysarthria and anarthria: Secondary | ICD-10-CM

## 2022-04-15 DIAGNOSIS — R1312 Dysphagia, oropharyngeal phase: Secondary | ICD-10-CM

## 2022-04-15 DIAGNOSIS — M6281 Muscle weakness (generalized): Secondary | ICD-10-CM | POA: Diagnosis not present

## 2022-04-15 NOTE — Therapy (Signed)
OUTPATIENT SPEECH LANGUAGE PATHOLOGY SWALLOW EVALUATION   Patient Name: Mike Holloway MRN: [id number] DOB:Jul 15, 1941, 81 y.o., male Today's Date: 04/15/2022   REFERRING PROVIDER: Bonney Roussel, PA   End of Session - 04/15/22 1512     Visit Number 1    Number of Visits 17    Date for SLP Re-Evaluation 07/14/22    SLP Start Time 1500    SLP Stop Time  1600    SLP Time Calculation (min) 60 min    Activity Tolerance Patient tolerated treatment well             Past Medical History:  Diagnosis Date   Arthritis    Atrial flutter ([place])    Diabetes mellitus ([place])    Essential tremor    Essential tremor    deep brain stimulator    Hypercholesteremia    Hypertension    Incontinence    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   SIADH (syndrome of inappropriate ADH production) ([place])    Sleep apnea    Stroke [facility])    Past Surgical History:  Procedure Laterality Date   ABLATION     APPENDECTOMY     CATARACT EXTRACTION, BILATERAL     COLONOSCOPY WITH PROPOFOL N/A 03/17/2018   Procedure: COLONOSCOPY WITH PROPOFOL;  Surgeon: Jonathon Bellows, MD;  Location: [facility] 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   Procedure: FECAL TRANSPLANT;  Surgeon: Jonathon Bellows, MD;  Location: [facility] ENDOSCOPY;  Service: Gastroenterology;  Laterality: N/A;   HEMORRHOID SURGERY     HERNIA REPAIR     HIP FRACTURE SURGERY     INTRAMEDULLARY (IM) NAIL INTERTROCHANTERIC Right 09/16/2018   Procedure: INTRAMEDULLARY (IM) NAIL INTERTROCHANTRIC;  Surgeon: Hessie Knows, MD;  Location: [facility] ORS;  Service: Orthopedics;  Laterality: Right;   IR CATHETER TUBE CHANGE  11/22/2018   NASAL SINUS SURGERY     ORCHIECTOMY     TONSILLECTOMY     Patient Active Problem List   Diagnosis Date Noted   DVT femoral (deep venous thrombosis) with thrombophlebitis, left ([place]) 02/06/2020   Lymphedema 04/15/2019   Bilateral leg edema  01/25/2019   Hyponatremia 12/24/2018   SIADH (syndrome of inappropriate ADH production) ([place]) 12/24/2018   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) 10/27/2018   Generalized weakness 10/14/2018   Hip fracture ([place]) 09/15/2018   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   Recurrent Clostridium difficile diarrhea 03/24/2018   HLD (hyperlipidemia) 03/24/2018   Contusion of right knee 02/14/2018   Bradycardia 12/28/2017   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   Tremor 09/04/2016   Chronic pain of right knee 08/10/2016   Right ankle pain 08/10/2016   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[place]) 12/11/2015   Urinary retention 09/08/2015   Lymphoma, non-Hodgkin's ([place]) 04/03/2015   Breathlessness on exertion 11/21/2014   Breath shortness 11/21/2014   Arthropathy 11/07/2014   Atrial flutter, paroxysmal ([place]) 11/07/2014   Type 2 diabetes mellitus ([place]) 11/07/2014   Benign essential tremor 11/07/2014   Benign essential HTN 11/07/2014   Mixed incontinence 11/07/2014   Hypercholesterolemia without hypertriglyceridemia 11/07/2014   Apnea, sleep 11/07/2014   Controlled type 2 diabetes mellitus without complication ([place]) 42/68/3419   Pure hypercholesterolemia 11/07/2014   Other abnormalities of gait and mobility 11/02/2011   Decreased mobility 11/02/2011   Abnormal gait 06/29/2011   Discoordination  06/29/2011    ONSET DATE: 04/12/22 (referral date). Dysphagia Onset originally in 2012    REFERRING DIAG: Essential tremor  THERAPY DIAG:  Oropharyngeal dysphagia  Dysarthria and anarthria  Rationale for Evaluation and Treatment Rehabilitation  SUBJECTIVE:   SUBJECTIVE STATEMENT: "My biggest problem is taking pills."  Pt accompanied by: spouse Pat  PERTINENT HISTORY: Patient is an 81 year old male with past medical history including essential tremor (since a young age), who  underwent left thalamic DBS placement in 2009. 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 Hx also includes DM, atrial flutter s/p ablation, HTN, non-hodgkin lymphoma, CKD, and arthritis. He worked with SLP in 2019 through [place], as well as during subsequent stays at [facility], [place] and with home health. He has been consuming regular diet with thin liquids, and takes pills whole in pudding. Pt referred for SLP evaluation due to dysphagia worsening over the past year. Pt's spouse also reports worsening dysarthria/intelligibility.   DIAGNOSTIC FINDINGS: MBS 07/05/2018: "Patient presents with mild-moderate oropharyngeal dysphagia. He was assessed today with DBS off and then on. I was unable to detect a unequivocal difference between these conditions. With DBS off, he had PAS scores that ranged from 1-4 with thin liquids. With DBS on, he had PAS scores that ranged from 1-3. There was no aspiration observed today. No airway invasion noted with nectar-thick liquids. With DBS on, he was unable to swallow a BASO4 capsule on 2 trials. With this item, he had repetitive lingual movements and was unable to clear the capsule from his oral cavity."  PAIN:  Are you having pain? Yes: NPRS scale: 1/10 Pain location: right knee Pain description: chronic  FALLS: Has patient fallen in last 6 months?  No  LIVING ENVIRONMENT: Lives with: lives with their family Lives in: House/apartment  PLOF:  Level of assistance: Needed assistance with ADLs Employment: Retired   PATIENT GOALS Swallow pills more easily  OBJECTIVE:   COGNITION: Overall cognitive status: Impaired Areas of impairment:  Attention: Impaired: Sustained Awareness: Impaired: Intellectual Functional deficits: difficulty with self-rating scale due to attention; pt reports of deficits inconsistent/ differ from presentation (awareness)  ORAL MOTOR  EXAMINATION Facial : WFL Lingual: WFL, Comment: per history, difficulty with lingual coordination Velum: WFL Mandible: WFL Cough: WFL Voice: Hoarse, Weak   CLINICAL SWALLOW ASSESSMENT:   Current diet: regular and thin liquids, takes pills whole in pudding Dentition: adequate natural dentition Feeding: able to feed self Consistencies tested:  Thin Liquid: Presentation: Cup and Straw Oral Phase: WFL Pharyngeal Phase: Impaired: immediate throat clearing, delayed throat clearing, and vocal quality change with liquid wash of solid Puree: Presentation: Spoon and Self-fed Oral Phase: WFL Pharyngeal Phase: WFL Regular: Presentation: By hand and Self-fed Oral Phase: WFL Pharyngeal Phase: Impaired: multiple swallows, delayed throat clearing, and vocal quality change (resonance)  Evaluation findings: Patient presents with s/sx of oropharyngeal dysphagia characterized by intermittent throat clearing with sips of thin liquid and regular solids. After solid presentation, pt exhibited change in resonance with pharyngeal focus; question pharyngeal residue vs aspiration. Recommend MBS for further evaluation of swallow function.  Aspiration risk factors:History of dysphagia, History of CVA, Neurological disease, and Cognitive impairment Overall aspiration risk:Mild to Moderate Diet Recommendations:  continue current diet pending objective testing Precautions:Slow rate and Small sips/bites Supervision: Patient able to feed self Oral care recommendations:Oral care BID Follow-up recommendations: MBSS and Defer until completion of instrumental exam  PATIENT REPORTED OUTCOME MEASURES (PROM): Reflux Symptom Index Hoarseness or a problem with your voice 0 (wife reports 2) Clearing your throat 3 Excess throat mucous or postnasal drip 5 Difficulty swallowing food, liquids, or pills 5 Coughing after you eat or after lying down 1 Breathing difficulties or choking episodes 2 Troublesome or annoying  cough 3 Sensation of something sticking in your throat or a lump in your throat 1 Heartburn, chest pain, indigestion, or stomach acid coming up 0 Total 17/45  Normative data suggests that an RSI of greater than or equal to 13 is clinically significant  Therefore, an RSI > 13 may be indicative of significant reflux disease.   EATING ASSESSMENT TOOL (EAT-10)   The patient was asked to rate to what extent the following statements are problematic on a scale of 0-4. 0 = No problem; 4 = Severe problem. A total score of 3 or higher is considered abnormal.  1.) My swallowing problem has caused me to lose weight. 0 2.) My swallowing problem interferes with my ability to go out for meals. 0 3.) Swallowing liquids takes extra effort. 0  (spouse rates as 2-3) 4.) Swallowing solids takes extra effort. 1  5.) Swallowing pills takes extra effort. 4  6.) Swallowing is painful. 0 7.) The pleasure of eating is affected by my swallowing. 0 8.) When I swallow food sticks in my throat. 2 9.) I cough when I eat. 3 10.) Swallowing is stressful. 0   TOTAL SCORE: 10    PATIENT EDUCATION: Education details: Repeat objective testing (MBSS) recommended due to clinical signs of pharyngeal dysphagia as well as worsening function over the past year Person educated: Patient and Spouse Education method: Explanation Education comprehension: verbalized understanding   ASSESSMENT:  CLINICAL IMPRESSION: Patient presents with signs of oropharyngeal dysphagia. Spouse assisted pt in providing history; he needed cues and rephrasing at times to comprehend questions. Patient/spouse report primary difficulty with swallowing pills; at times pt feels he is unable to transfer pills from the oral cavity to the pharynx, and at times he has "choking episodes" when he feels he cannot initiate a swallow. Unable to assess lingual movement/coordination during the swallow during clinical exam. In addition, pt exhibits throat clearing  and vocal quality change after presentation of thin liquids and regular solids, possibly indicating decreased airway protection. Recommend instrumental exam (MBSS) to further evaluate swallow function. Patient's wife also reports decline in intelligibility. SLP to request MBSS and Speech eval/tx orders from referring provider; will update goals, frequency and plan of care upon completion of MBSS and motor speech assessment.   OBJECTIVE IMPAIRMENTS include dysarthria and dysphagia. These impairments are limiting patient from effectively communicating at home and in community and safety when swallowing. Factors affecting potential to achieve goals and functional outcome are ability to learn/carryover information, co-morbidities, and previous level of function. Patient will benefit from skilled SLP services to address above impairments and improve overall function.  REHAB POTENTIAL: Fair to good   GOALS: Goals reviewed with patient? Yes  SHORT TERM GOALS: Target date: 10 sessions  Patient will participate in objective swallowing evaluation (MBSS) to identify safest diet recommendation as well as therapeutic targets. Baseline: Goal status: INITIAL  2.  Pt will participate in evaluation of motor speech with intelligibility goals to be added PRN. Baseline:  Goal status: INITIAL    LONG TERM GOALS: Target date:  Patient will consume recommended diet using strategies and compensations without overt s/sx aspiration >95% of the time. Baseline:  Goal status:  INITIAL    PLAN: SLP FREQUENCY:  to be determined after completion of instrumental study  SLP DURATION: other: to be determined after completion of instrumental study  PLANNED INTERVENTIONS: Aspiration precaution training, Pharyngeal strengthening exercises, Diet toleration management , Environmental controls, Trials of upgraded texture/liquids, Functional tasks, SLP instruction and feedback, Compensatory strategies, and Patient/family  education  Deneise Lever, MS, Merchandiser, retail Pathologist [phone]

## 2022-04-15 NOTE — Telephone Encounter (Signed)
SLP placed call to [facility] to request order for Modified Barium Swallow Study for objective evaluation of oropharyngeal dysphagia (R13.12). Per pt's spouse, also requested SLP eval and tx orders for dysathria due to pt's reduced intelligibility. Left message on nsg line. Will await orders prior to resuming therapy.  Deneise Lever, [place], Actor [phone]

## 2022-04-19 ENCOUNTER — Encounter: Payer: Medicare Other | Admitting: Speech Pathology

## 2022-04-22 ENCOUNTER — Encounter: Payer: Medicare Other | Admitting: Speech Pathology

## 2022-04-23 ENCOUNTER — Ambulatory Visit (INDEPENDENT_AMBULATORY_CARE_PROVIDER_SITE_OTHER): Payer: Medicare Other | Admitting: Urology

## 2022-04-23 ENCOUNTER — Encounter: Payer: Self-pay | Admitting: Urology

## 2022-04-23 DIAGNOSIS — R339 Retention of urine, unspecified: Secondary | ICD-10-CM

## 2022-04-23 NOTE — Progress Notes (Unsigned)
Patient ID: Mike Holloway, male   DOB: 1941/05/26, 81 y.o.   MRN: [id number] Cath Change/ Replacement  Patient is present today for a catheter change due to urinary retention.  83m of water was removed from the balloon, a 16FR foley coude cath was removed with out difficulty.  Patient was cleaned and prepped in a sterile fashion with betadine. A 16  FR foley coude cath was replaced into the bladder no complications were noted Urine return was noted 280mand urine was yellow in color. The balloon was filled with 1029mf sterile water. A leg bag was attached for drainage.  A night bag was also given to the patient and patient was given instruction on how to change from one bag to another. Patient was given proper instruction on catheter care.    Performed by: DeSEdwin DadaMA  Follow up: 1mt19m

## 2022-04-25 NOTE — Progress Notes (Signed)
I was present in office and available

## 2022-04-26 ENCOUNTER — Other Ambulatory Visit: Payer: Self-pay | Admitting: Physician Assistant

## 2022-04-26 DIAGNOSIS — R633 Feeding difficulties, unspecified: Secondary | ICD-10-CM

## 2022-04-26 DIAGNOSIS — R1310 Dysphagia, unspecified: Secondary | ICD-10-CM

## 2022-04-27 ENCOUNTER — Encounter: Payer: Medicare Other | Admitting: Speech Pathology

## 2022-04-29 ENCOUNTER — Encounter: Payer: Medicare Other | Admitting: Speech Pathology

## 2022-05-03 ENCOUNTER — Ambulatory Visit
Admission: RE | Admit: 2022-05-03 | Discharge: 2022-05-03 | Disposition: A | Discharge status: Home / Self Care | Payer: Medicare Other | Source: Ambulatory Visit | Attending: Physician Assistant | Admitting: Physician Assistant

## 2022-05-03 DIAGNOSIS — R1310 Dysphagia, unspecified: Secondary | ICD-10-CM | POA: Diagnosis not present

## 2022-05-03 DIAGNOSIS — R633 Feeding difficulties, unspecified: Secondary | ICD-10-CM | POA: Diagnosis present

## 2022-05-04 ENCOUNTER — Ambulatory Visit: Payer: Medicare Other | Admitting: Speech Pathology

## 2022-05-04 NOTE — Therapy (Signed)
[place], [place], [address] Phone: [phone]   Fax:     Modified Barium Swallow  Patient Details  Name: Mike Holloway MRN: [id number] Date of Birth: Feb 17, 1941 No data recorded  Encounter Date: 05/03/2022   End of Session - 05/04/22 1844     Visit Number 1    Number of Visits 1    Date for SLP Re-Evaluation 05/03/22    Activity Tolerance Patient tolerated treatment well             Past Medical History:  Diagnosis Date   Arthritis    Atrial flutter ([place])    Diabetes mellitus ([place])    Essential tremor    Essential tremor    deep brain stimulator    Hypercholesteremia    Hypertension    Incontinence    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[place])    grew on the testical   SIADH (syndrome of inappropriate ADH production) ([place])    Sleep apnea    Stroke [facility])     Past Surgical History:  Procedure Laterality Date   ABLATION     APPENDECTOMY     CATARACT EXTRACTION, BILATERAL     COLONOSCOPY WITH PROPOFOL N/A 03/17/2018   Procedure: COLONOSCOPY WITH PROPOFOL;  Surgeon: Jonathon Bellows, MD;  Location: [facility] 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    05/04/22 1800  SLP Visit Information  SLP Received On 05/03/22  Subjective  Subjective pt coughed with raisin bran  this morning  Patient/Family Stated Goal swallow pills easier  Pain Assessment  Pain Assessment No/denies pain  General Information  Date of Onset 04/12/22 (referral date)  HPI Patient is an 81 year old male with past medical history including essential tremor (since a young age), who underwent left thalamic DBS placement in 2009. 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 Hx also includes DM, atrial flutter s/p ablation, HTN, non-hodgkin lymphoma, CKD, and arthritis. He worked with SLP in 2019 through [place], as well as during subsequent stays at [facility], [place] and with home health. He has been consuming regular diet with thin liquids, and takes pills whole in pudding. Pt referred for SLP evaluation due to dysphagia worsening over the past year. Pt's spouse also reports worsening dysarthria/intelligibility.  Type of Study MBS-Modified Barium Swallow Study  Previous Swallow Assessment MBS 07/05/2018: "Patient presents with mild-moderate oropharyngeal dysphagia. He was assessed today with DBS off and then on. I was unable to detect a unequivocal difference between these conditions. With DBS off, he had PAS scores that ranged from 1-4 with thin liquids. With DBS on, he had PAS scores that ranged from 1-3. There was no aspiration observed today. No airway invasion  noted with nectar-thick liquids. With DBS on, he was unable to swallow a BASO4 capsule on 2 trials. With this item, he had repetitive lingual movements and was unable to clear the capsule from his oral cavity."  Diet Prior to this Study Regular;Thin liquids  Temperature Spikes Noted No  Respiratory Status Room air  History of Recent Intubation No  Behavior/Cognition Alert;Cooperative;Pleasant mood  Oral Cavity Assessment WFL  Oral Care Completed by SLP No  Oral Cavity - Dentition Adequate natural dentition  Vision Functional for self feeding   Self-Feeding Abilities Able to feed self  Patient Positioning Upright in chair  Baseline Vocal Quality Normal  Volitional Cough Strong  Volitional Swallow Able to elicit  Anatomy WFL  Pharyngeal Secretions Not observed secondary MBS  Oral Motor/Sensory Function  Overall Oral Motor/Sensory Function WFL  Oral Preparation/Oral Phase  Oral Phase Impaired  Oral - Pudding  Oral - Pudding Teaspoon Reduced posterior propulsion;Lingual/palatal residue  Oral - Nectar  Oral - Nectar Teaspoon Reduced posterior propulsion  Oral - Nectar Cup Reduced posterior propulsion;Lingual/palatal residue  Oral - Thin  Oral - Thin Teaspoon Reduced posterior propulsion  Oral - Thin Cup Reduced posterior propulsion;Lingual/palatal residue  Oral - Solids  Oral - Mech Soft Reduced posterior propulsion;Lingual/palatal residue;Decreased bolus cohesion  Pharyngeal Phase  Pharyngeal Phase Impaired  Pharyngeal - Pudding  Pharyngeal- Pudding Teaspoon Reduced tongue base retraction  Pharyngeal Material does not enter airway  Pharyngeal - Nectar  Pharyngeal- Nectar Teaspoon Reduced tongue base retraction;Pharyngeal residue - valleculae;Pharyngeal residue - pyriform  Pharyngeal Material enters airway, remains ABOVE vocal cords then ejected out  Pharyngeal- Nectar Cup Reduced tongue base retraction;Penetration/Apiration after swallow;Pharyngeal residue - valleculae;Pharyngeal residue - pyriform  Pharyngeal Material does not enter airway;Material enters airway, remains ABOVE vocal cords then ejected out;Material enters airway, remains ABOVE vocal cords and not ejected out  Pharyngeal - Thin  Pharyngeal- Thin Teaspoon Reduced tongue base retraction;Penetration/Apiration after swallow;Pharyngeal residue - valleculae;Pharyngeal residue - pyriform  Pharyngeal Material enters airway, remains ABOVE vocal cords then ejected out  Pharyngeal- Thin Cup Reduced tongue base retraction;Penetration/Apiration after swallow;Pharyngeal  residue - valleculae;Pharyngeal residue - pyriform  Pharyngeal Material enters airway, remains ABOVE vocal cords then ejected out;Material enters airway, remains ABOVE vocal cords and not ejected out  Pharyngeal - Solids  Pharyngeal- Mechanical Soft Reduced tongue base retraction  Pharyngeal Material does not enter airway  Cervical Esophageal Phase  Cervical Esophageal Phase [facility]  Clinical Impression  Clinical Impression Patient presents with mild-moderate oropharyngeal dysphagia consistent with prior objective testing. Oral stage is characterized by adequate oral containment and bolus preparation, however, slow and disorganized bolus transport, and repetitive lingual movements with barium tablet (unable to swallow with liquid or puree). Swallow initiation is timely at the level of the valleculae. Tongue base retraction is mildly reduced, leading to mild-moderate valleculae and pyriform residue after the swallow. Airway closure is complete, however trace larygneal penetration occurs when pt takes multiple swallows, as residue spills into the open laryngeal vestibule between swallows. There is no aspiration. Pt reduces residue with cued dry swallow. Recommend pt continue current diet, use caution with mixed consistencies. If unable to crush medications per pharmacy, consider investigating liquid formulations. Pt's spouse reports after working with SLP on strengthening several years ago, pt was able to improve function and had greater ease swallowing medications. Reports he has had a decline in function swallowing medications and also coughing at mealtimes. Pt to follow up in outpatient clinic with SLP and would benefit from skilled ST intervention  for dysphagia for carryover of swallowing precautions and HEP for improving strength and coordination.  SLP Visit Diagnosis Dysphagia, oropharyngeal phase (R13.12)  Impact on safety and function Mild aspiration risk  Swallow Evaluation Recommendations  SLP Diet  Recommendations Regular solids;Dysphagia 3 (Mech soft) solids;Thin liquid  Liquid Administration via Cup  Medication Administration Crushed with puree (check with pharmacy)  Supervision Patient able to self feed  Compensations Slow rate;Small sips/bites;Clear throat intermittently;Multiple dry swallows after each bite/sip  Postural Changes Remain semi-upright after after feeds/meals (Comment);Seated upright at 90 degrees  Treatment Plan  Oral Care Recommendations Oral care BID  Treatment Recommendations Defer treatment plan to f/u with SLP  Follow Up Recommendations Outpatient SLP  Prognosis  Prognosis for Safe Diet Advancement Fair  Barriers to Reach Goals Other (Comment) (deficits appear chronic in nature)  Individuals Consulted  Consulted and Agree with Results and Recommendations Patient;Family member/caregiver  Family Member Consulted wife  Report Sent to  Referring physician  Progression Toward Goals  Progression toward goals Goals met, education completed, patient discharged from SLP  SLP Time Calculation  SLP Start Time (ACUTE ONLY) 1245  SLP Stop Time (ACUTE ONLY) 1320  SLP Time Calculation (min) (ACUTE ONLY) 35 min  SLP Evaluations  $ SLP Speech Visit 1 Visit  SLP Evaluations  $Outpatient MBS Swallow 1 Procedure      Patient will benefit from skilled therapeutic intervention in order to improve the following deficits and impairments:   Dysphagia, unspecified type - Plan: DG SWALLOW FUNC OP MEDICARE SPEECH PATH, DG SWALLOW FUNC OP MEDICARE SPEECH PATH  Feeding difficulties - Plan: DG SWALLOW FUNC OP MEDICARE SPEECH PATH, DG SWALLOW FUNC OP MEDICARE SPEECH PATH        Problem List Patient Active Problem List   Diagnosis Date Noted   DVT femoral (deep venous thrombosis) with thrombophlebitis, left ([place]) 02/06/2020   Lymphedema 04/15/2019   Bilateral leg edema 01/25/2019   Hyponatremia 12/24/2018   SIADH (syndrome of inappropriate ADH production) ([place])  12/24/2018   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) 10/27/2018   Generalized weakness 10/14/2018   Hip fracture ([place]) 09/15/2018   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) 11/07/2014   Type 2 diabetes mellitus ([place]) 11/07/2014   Benign essential tremor 11/07/2014   Benign essential HTN 11/07/2014   Mixed incontinence 11/07/2014   Hypercholesterolemia without hypertriglyceridemia 11/07/2014   Apnea, sleep 11/07/2014   Controlled type 2 diabetes mellitus without complication ([place]) 27/02/2375   Pure hypercholesterolemia 11/07/2014   Other abnormalities of gait and mobility 11/02/2011   Decreased mobility 11/02/2011   Abnormal gait 06/29/2011   Discoordination 06/29/2011   Deneise Lever, MS, CCC-SLP Speech-Language Pathologist [phone]  Aliene Altes, CCC-SLP 05/04/2022, 6:44 PM  [facility] [place], [place], [address] Phone: [phone]   Fax:     Name: Mike Holloway MRN: [id number] Date of Birth:  1941/04/28

## 2022-05-06 ENCOUNTER — Ambulatory Visit: Payer: Medicare Other | Attending: Physician Assistant | Admitting: Speech Pathology

## 2022-05-06 DIAGNOSIS — R471 Dysarthria and anarthria: Secondary | ICD-10-CM | POA: Insufficient documentation

## 2022-05-06 DIAGNOSIS — R1312 Dysphagia, oropharyngeal phase: Secondary | ICD-10-CM | POA: Diagnosis present

## 2022-05-06 NOTE — Therapy (Signed)
OUTPATIENT SPEECH LANGUAGE PATHOLOGY SWALLOW TREATMENT   Patient Name: Mike Holloway MRN: [id number] DOB:05/07/1941, 81 y.o., male Today's Date: 05/06/2022   REFERRING PROVIDER: Bonney Roussel, PA   End of Session - 05/06/22 1617     Visit Number 2    Number of Visits 17    Date for SLP Re-Evaluation 07/14/22    SLP Start Time 61    SLP Stop Time  1400    SLP Time Calculation (min) 60 min    Activity Tolerance Patient tolerated treatment well             Past Medical History:  Diagnosis Date   Arthritis    Atrial flutter ([place])    Diabetes mellitus ([place])    Essential tremor    Essential tremor    deep brain stimulator    Hypercholesteremia    Hypertension    Incontinence    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 on the testical   SIADH (syndrome of inappropriate ADH production) ([place])    Sleep apnea    Stroke [facility])    Past Surgical History:  Procedure Laterality Date   ABLATION     APPENDECTOMY     CATARACT EXTRACTION, BILATERAL     COLONOSCOPY WITH PROPOFOL N/A 03/17/2018   Procedure: COLONOSCOPY WITH PROPOFOL;  Surgeon: Mike Bellows, MD;  Location: [facility] 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   Procedure: FECAL TRANSPLANT;  Surgeon: Mike Bellows, MD;  Location: [facility] ENDOSCOPY;  Service: Gastroenterology;  Laterality: N/A;   HEMORRHOID SURGERY     HERNIA REPAIR     HIP FRACTURE SURGERY     INTRAMEDULLARY (IM) NAIL INTERTROCHANTERIC Right 09/16/2018   Procedure: INTRAMEDULLARY (IM) NAIL INTERTROCHANTRIC;  Surgeon: Mike Knows, MD;  Location: [facility] ORS;  Service: Orthopedics;  Laterality: Right;   IR CATHETER TUBE CHANGE  11/22/2018   NASAL SINUS SURGERY     ORCHIECTOMY     TONSILLECTOMY     Patient Active Problem List   Diagnosis Date Noted   DVT femoral (deep venous thrombosis) with thrombophlebitis, left ([place]) 02/06/2020   Lymphedema 04/15/2019   Bilateral leg edema  01/25/2019   Hyponatremia 12/24/2018   SIADH (syndrome of inappropriate ADH production) ([place]) 12/24/2018   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) 10/27/2018   Generalized weakness 10/14/2018   Hip fracture ([place]) 09/15/2018   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   HLD (hyperlipidemia) 03/24/2018   Contusion of right knee 02/14/2018   Bradycardia 12/28/2017   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   Tremor 09/04/2016   Chronic pain of right knee 08/10/2016   Right ankle pain 08/10/2016   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[place]) 12/11/2015   Urinary retention 09/08/2015   Lymphoma, non-Hodgkin's ([place]) 04/03/2015   Breathlessness on exertion 11/21/2014   Breath shortness 11/21/2014   Arthropathy 11/07/2014   Atrial flutter, paroxysmal ([place]) 11/07/2014   Type 2 diabetes mellitus ([place]) 11/07/2014   Benign essential tremor 11/07/2014   Benign essential HTN 11/07/2014   Mixed incontinence 11/07/2014   Hypercholesterolemia without hypertriglyceridemia 11/07/2014   Apnea, sleep 11/07/2014   Controlled type 2 diabetes mellitus without complication ([place]) 16/09/930   Pure hypercholesterolemia 11/07/2014   Other abnormalities of gait and mobility 11/02/2011   Decreased mobility 11/02/2011   Abnormal gait 06/29/2011   Discoordination  06/29/2011    ONSET DATE: 04/12/22 (referral date). Dysphagia Onset originally in 2012    REFERRING DIAG: Essential tremor  THERAPY DIAG:  Oropharyngeal dysphagia  Rationale for Evaluation and Treatment Rehabilitation  SUBJECTIVE:   SUBJECTIVE STATEMENT: Pt brought "the breather" device today. Pt accompanied by: spouse Mike Holloway  PERTINENT HISTORY: Patient is an 81 year old male with past medical history including essential tremor (since a young age), who underwent left thalamic DBS  placement in 2009. 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 Hx also includes DM, atrial flutter s/p ablation, HTN, non-hodgkin lymphoma, CKD, and arthritis. He worked with SLP in 2019 through [place], as well as during subsequent stays at [facility], [place] and with home health. He has been consuming regular diet with thin liquids, and takes pills whole in pudding. Pt referred for SLP evaluation due to dysphagia worsening over the past year. Pt's spouse also reports worsening dysarthria/intelligibility.   DIAGNOSTIC FINDINGS: MBS 07/05/2018: "Patient presents with mild-moderate oropharyngeal dysphagia. He was assessed today with DBS off and then on. I was unable to detect a unequivocal difference between these conditions. With DBS off, he had PAS scores that ranged from 1-4 with thin liquids. With DBS on, he had PAS scores that ranged from 1-3. There was no aspiration observed today. No airway invasion noted with nectar-thick liquids. With DBS on, he was unable to swallow a BASO4 capsule on 2 trials. With this item, he had repetitive lingual movements and was unable to clear the capsule from his oral cavity."  MBS 05/04/22: Patient presents with mild-moderate oropharyngeal dysphagia consistent with prior objective testing. Oral stage is characterized by adequate oral containment and bolus preparation, however, slow and disorganized bolus transport, and repetitive lingual movements with barium tablet (unable to swallow with liquid or puree). Swallow initiation is timely at the level of the valleculae. Tongue base retraction is mildly reduced, leading to mild-moderate valleculae and pyriform residue after the swallow. Airway closure is complete, however trace larygneal penetration occurs when pt takes multiple swallows, as residue spills into the open laryngeal vestibule between swallows. There is no aspiration. Pt  reduces residue with cued dry swallow.   PAIN:  Are you having pain? No  PATIENT GOALS Swallow pills more easily  OBJECTIVE:   TODAY'S TREATMENT: SLP reviewed MBS images using teachback to educate on swallow compensations (Swallow twice for liquids) and rationale for swallowing HEP. Worked with pt to ID appropriate setting and provided instruction on technique with his existing EMST/IMST device The Breather. Pt required occasional mod cues for complete exhalation. Introduced effortful swallow, lingual sweep and lingual coordination exercises with pt requiring initial mod cues fading to occasional min cues to complete accurately. Pt benefitted from use of mirror to improve ROM with lingual sweep. Provided MedBridge access as well as handouts and tracker sheets.    PATIENT EDUCATION: Education details: Swallow HEP, aspiration precautions, swallow twice for liquids. Person educated: Patient and Spouse Education method: Explanation Education comprehension: verbalized understanding   ASSESSMENT:  CLINICAL IMPRESSION: Patient presents with mild-moderate oropharyngeal dysphagia per repeat MBS on 05/04/22. Initiated basic oropharyngeal dysphagia HEP today; pt requires mod cues. Will complete motor speech evaluation next session and update goals. Patient would benefit from brief course of ST to reinforce swallowing precautions, technique and consistency with exercises to improve oropharyngeal functioning, and for training in intelligibility compensations.    OBJECTIVE IMPAIRMENTS include dysarthria and dysphagia. These impairments are  limiting patient from effectively communicating at home and in community and safety when swallowing. Factors affecting potential to achieve goals and functional outcome are ability to learn/carryover information, co-morbidities, and previous level of function. Patient will benefit from skilled SLP services to address above impairments and improve overall  function.  REHAB POTENTIAL: Fair to good   GOALS: Goals reviewed with patient? Yes  SHORT TERM GOALS: Target date: 10 sessions  Patient will participate in objective swallowing evaluation (MBSS) to identify safest diet recommendation as well as therapeutic targets. Baseline: Goal status: MET  2.  Pt will participate in evaluation of motor speech with intelligibility goals to be added PRN. Baseline:  Goal status: INITIAL  3.  Pt will complete basic HEP for oropharyngeal dysphagia with rare min cues. Baseline:  Goal status: INITIAL    LONG TERM GOALS: Target date: 07/14/2022 Patient will consume recommended diet using strategies and compensations without overt s/sx aspiration >95% of the time. Baseline:  Goal status: INITIAL  Patient will complete advanced HEP for oropharygneal dysphagia with modified independence. Baseline:  Goal status: INITIAL    PLAN: SLP FREQUENCY:  1-2 x per week  SLP DURATION: 12 weeks  PLANNED INTERVENTIONS: Aspiration precaution training, Pharyngeal strengthening exercises, Diet toleration management , Environmental controls, Trials of upgraded texture/liquids, Functional tasks, SLP instruction and feedback, Compensatory strategies, and Patient/family education  Deneise Lever, MS, Actor [phone]

## 2022-05-06 NOTE — Patient Instructions (Signed)
Please see handout with MedBridge instructions for home exercises.

## 2022-05-11 ENCOUNTER — Ambulatory Visit: Payer: Medicare Other | Admitting: Speech Pathology

## 2022-05-13 ENCOUNTER — Encounter: Payer: Medicare Other | Admitting: Speech Pathology

## 2022-05-18 ENCOUNTER — Ambulatory Visit: Payer: Medicare Other | Admitting: Speech Pathology

## 2022-05-18 DIAGNOSIS — R1312 Dysphagia, oropharyngeal phase: Secondary | ICD-10-CM | POA: Diagnosis not present

## 2022-05-18 DIAGNOSIS — R471 Dysarthria and anarthria: Secondary | ICD-10-CM

## 2022-05-19 NOTE — Therapy (Signed)
OUTPATIENT SPEECH LANGUAGE PATHOLOGY  MOTOR SPEECH EVALUATION SWALLOW TREATMENT   Patient Name: Mike Holloway MRN: [id number] DOB:02-24-1941, 81 y.o., male Today's Date: 05/19/2022   REFERRING PROVIDER: Bonney Roussel, PA   End of Session - 05/19/22 1239     Visit Number 3    Number of Visits 17    Date for SLP Re-Evaluation 07/14/22    Progress Note Due on Visit 10    SLP Start Time 37    SLP Stop Time  1405    SLP Time Calculation (min) 65 min    Activity Tolerance Patient tolerated treatment well             Past Medical History:  Diagnosis Date   Arthritis    Atrial flutter (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[place])    grew on the testical   SIADH (syndrome of inappropriate ADH production) ([place])    Sleep apnea    Stroke [facility])    Past Surgical History:  Procedure Laterality Date   ABLATION     APPENDECTOMY     CATARACT EXTRACTION, BILATERAL     COLONOSCOPY WITH PROPOFOL N/A 03/17/2018   Procedure: COLONOSCOPY WITH PROPOFOL;  Surgeon: Jonathon Bellows, MD;  Location: [facility] 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   Procedure: FECAL TRANSPLANT;  Surgeon: Jonathon Bellows, MD;  Location: [facility] ENDOSCOPY;  Service: Gastroenterology;  Laterality: N/A;   HEMORRHOID SURGERY     HERNIA REPAIR     HIP FRACTURE SURGERY     INTRAMEDULLARY (IM) NAIL INTERTROCHANTERIC Right 09/16/2018   Procedure: INTRAMEDULLARY (IM) NAIL INTERTROCHANTRIC;  Surgeon: Hessie Knows, MD;  Location: [facility] ORS;  Service: Orthopedics;  Laterality: Right;   IR CATHETER TUBE CHANGE  11/22/2018   NASAL SINUS SURGERY     ORCHIECTOMY     TONSILLECTOMY     Patient Active Problem List   Diagnosis Date Noted   DVT femoral (deep venous thrombosis) with thrombophlebitis, left ([place])  02/06/2020   Lymphedema 04/15/2019   Bilateral leg edema 01/25/2019   Hyponatremia 12/24/2018   SIADH (syndrome of inappropriate ADH production) ([place]) 12/24/2018   Erosion of urethra due to catheterization of urinary tract ([place]) 10/27/2018   Generalized weakness 10/14/2018   Hip fracture ([place]) 09/15/2018   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   HLD (hyperlipidemia) 03/24/2018   Contusion of right knee 02/14/2018   Bradycardia 12/28/2017   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   Tremor 09/04/2016   Chronic pain of right knee 08/10/2016   Right ankle pain 08/10/2016   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   Non-Hodgkin's lymphoma ([place]) 12/11/2015   Urinary retention 09/08/2015   Lymphoma, non-Hodgkin's ([place]) 04/03/2015   Breathlessness on exertion 11/21/2014   Breath shortness 11/21/2014   Arthropathy 11/07/2014   Atrial flutter, paroxysmal ([place]) 11/07/2014   Type 2 diabetes mellitus ([place]) 11/07/2014   Benign essential tremor 11/07/2014   Benign essential HTN 11/07/2014   Mixed incontinence 11/07/2014   Hypercholesterolemia without hypertriglyceridemia 11/07/2014   Apnea, sleep 11/07/2014   Controlled type 2 diabetes mellitus without complication ([place]) 16/94/5038   Pure hypercholesterolemia 11/07/2014   Other abnormalities of gait and mobility 11/02/2011  Decreased mobility 11/02/2011   Abnormal gait 06/29/2011   Discoordination 06/29/2011    ONSET DATE: 04/12/22 (referral date). Dysphagia Onset originally in 2012    REFERRING DIAG: Essential tremor  THERAPY DIAG:  Oropharyngeal dysphagia  Dysarthria and anarthria  Rationale for Evaluation and Treatment Rehabilitation  SUBJECTIVE:   SUBJECTIVE STATEMENT:  Wife reports that pt bit his tongue 2 times, has pictures Pt accompanied by: spouse Pat  PERTINENT HISTORY: Patient is an 81 year old male  with past medical history including essential tremor (since a young age), who underwent left thalamic DBS placement in 2009. 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 Hx also includes DM, atrial flutter s/p ablation, HTN, non-hodgkin lymphoma, CKD, and arthritis. He worked with SLP in 2019 through [place], as well as during subsequent stays at [facility], [place] and with home health. He has been consuming regular diet with thin liquids, and takes pills whole in pudding. Pt referred for SLP evaluation due to dysphagia worsening over the past year. Pt's spouse also reports worsening dysarthria/intelligibility.   DIAGNOSTIC FINDINGS: MBS 07/05/2018: "Patient presents with mild-moderate oropharyngeal dysphagia. He was assessed today with DBS off and then on. I was unable to detect a unequivocal difference between these conditions. With DBS off, he had PAS scores that ranged from 1-4 with thin liquids. With DBS on, he had PAS scores that ranged from 1-3. There was no aspiration observed today. No airway invasion noted with nectar-thick liquids. With DBS on, he was unable to swallow a BASO4 capsule on 2 trials. With this item, he had repetitive lingual movements and was unable to clear the capsule from his oral cavity."  MBS 05/04/22: Patient presents with mild-moderate oropharyngeal dysphagia consistent with prior objective testing. Oral stage is characterized by adequate oral containment and bolus preparation, however, slow and disorganized bolus transport, and repetitive lingual movements with barium tablet (unable to swallow with liquid or puree). Swallow initiation is timely at the level of the valleculae. Tongue base retraction is mildly reduced, leading to mild-moderate valleculae and pyriform residue after the swallow. Airway closure is complete, however trace larygneal penetration occurs when pt takes multiple  swallows, as residue spills into the open laryngeal vestibule between swallows. There is no aspiration. Pt reduces residue with cued dry swallow.   PAIN:  Are you having pain? No  PATIENT GOALS Swallow pills more easily; pt also states that she is concerned with pt's speech intelligibility  OBJECTIVE:   TODAY'S TREATMENT: Skilled treatment session focused on pt's dysphagia goals and completing motor speech evaluation. SLP facilitated session by providing skilled observation of pt consuming puree, graham crackers with and without peanut butter with thin liquids via bottle top. Pt's wife showed this Conservator, museum/gallery of pt's left lateral tongue s/p biting it twice since last session. At this time, pt's lateral tongue appears almost healed. Oral motor exam was unremarkable for decreased sensory or motor ability. When consuming the above mentioned items, pt's oral phase appeared very functional with no evidence of motor impairment that would place pt at risk of biting his tongue. His wife states that pt was consuming a "ham roll-up (sliced ham and cheese rolled up together)" each time that he bit his tongue. At this time, recommend pt cease consuming ham rolls and also recommend placement of boluses on pt's left to avoid possibly biting swollen lateral right edge on tongue. Pt was able to return demonstration  In addition, pt participated in motor speech evaluation.    MOTOR SPEECH EVALUATION   RESPIRATION: Reduced breath support and Chest-centered breathing  PHONATION:   Objective Voice Measurements  Sustained "ah" maximum phonation time: 6.3 seconds Sustained "ah" loudness average: 72 dB Average fundamental frequency during sustained "ah":106 Hz   (1.7 SD below average of  145 Hz +/- 23 for gender)  Oral reading (passage) loudness average: 78 dB Oral reading loudness range: 10 dB Conversational pitch average: 117 Hz Highest dynamic pitch in conversational speech: 142 Hz Lowest dynamic pitch  in conversational speech: 95 Hz Conversational pitch range: 47 Hz Conversational loudness average: 79 dB Conversational loudness range: 12 dB Voice quality: breathy, low vocal intensity, and intermittently wet   Vocal abuse: throat clears     RESONANCE: [place]  ARTICULATION: Connected Speech characteristics: Imprecise consonants, Irregular articulatory breakdowns, Slow rate, Monoloudness, Monopitch, and Vowel distortions Intelligibility: For trained listener with context in a quiet environment, intelligibility rated as approximately < 50 % at the sentence level Non-verbal oral apraxia: Not present   ORAL MOTOR ASSESSMENT:   WFL  PATIENT REPORTED OUTCOME MEASURES (PROM):  The Communication Effectiveness Survey is a patient-reported outcome measure in which the patient rates their own effectiveness in different communication situations. A higher score indicates greater effectiveness.   Pt's self-rating was 8/32. Patient reported ineffective communication across all areas assessed.     PATIENT EDUCATION: Education details: Swallow HEP, aspiration precautions, swallow twice for liquids. Person educated: Patient and Spouse Education method: Explanation Education comprehension: verbalized understanding   ASSESSMENT:  CLINICAL IMPRESSION: Patient presents with moderate to severe motor speech impairment that is c/b reduced vocal intensity, imprecise articulation, dysprosody resulting in< 50% speech intelligibility at the sentence level. Pt is largely unaware of this impairment. SLP replayed pt's reading of grandfather passage as well as his verbal description of "The Cookie Jar" picture. Pt stated that he was "really surprised" and "had no idea that my speech sounds like that."    OBJECTIVE IMPAIRMENTS include dysarthria and dysphagia. These impairments are limiting patient from effectively communicating at home and in community and safety when swallowing. Factors affecting  potential to achieve goals and functional outcome are ability to learn/carryover information, co-morbidities, and previous level of function. Patient will benefit from skilled SLP services to address above impairments and improve overall function.  REHAB POTENTIAL: Fair to good   GOALS: Goals reviewed with patient? Yes  SHORT TERM GOALS: Target date: 10 sessions  Patient will participate in objective swallowing evaluation (MBSS) to identify safest diet recommendation as well as therapeutic targets. Baseline: Goal status: MET  2.  Pt will participate in evaluation of motor speech with intelligibility goals to be added PRN. Baseline:  Goal status: MET  3.  Pt will complete basic HEP for oropharyngeal dysphagia with rare min cues. Baseline:  Goal status: INITIAL  3. With moderate assistance, pt will utilize speech intelligibility strategies (increased vocal intensity and over-articulation) to achieve > 95% speech intelligibility at the simple phrase level across 3 sessions.   Baseline: < 50% speech intelligibility at the simple sentence  Goal status: INITIAL    LONG TERM GOALS: Target date: 07/14/2022 Patient will consume recommended diet using strategies and compensations without overt s/sx aspiration >95% of the time. Baseline:  Goal status: INITIAL  Patient will complete advanced HEP for oropharygneal dysphagia with modified independence. Baseline:  Goal status: INITIAL  3. With minimal assistance, pt will utilize speech intelligibility strategies to achieve ~ 75% intelligibility with common  functional phrases.  Baseline: < 50% intelligibility Goal status: INITIAL     PLAN: SLP FREQUENCY:  1-2 x per week  SLP DURATION: 12 weeks  PLANNED INTERVENTIONS: Aspiration precaution training, Pharyngeal strengthening exercises, Diet toleration management , Environmental controls, Trials of upgraded texture/liquids, Functional tasks, SLP instruction and feedback, Compensatory  strategies, and Patient/family education   Seabron Iannello B. Rutherford Nail, M.S., CCC-SLP, Mining engineer Certified Brain Injury [place]  [place] Office [phone] Ascom [phone] Fax [phone]

## 2022-05-20 ENCOUNTER — Encounter: Payer: Medicare Other | Admitting: Speech Pathology

## 2022-05-21 ENCOUNTER — Encounter: Payer: Self-pay | Admitting: Physician Assistant

## 2022-05-21 ENCOUNTER — Ambulatory Visit (INDEPENDENT_AMBULATORY_CARE_PROVIDER_SITE_OTHER): Payer: Medicare Other | Admitting: Physician Assistant

## 2022-05-21 VITALS — Ht 68.0 in | Wt 223.0 lb

## 2022-05-21 DIAGNOSIS — Z466 Encounter for fitting and adjustment of urinary device: Secondary | ICD-10-CM | POA: Diagnosis not present

## 2022-05-21 DIAGNOSIS — R339 Retention of urine, unspecified: Secondary | ICD-10-CM | POA: Diagnosis not present

## 2022-05-21 NOTE — Progress Notes (Signed)
Cath Change/ Replacement  Patient is present today for a catheter change due to urinary retention.  60m of water was removed from the balloon, a 16FR coude foley cath was removed without difficulty.  Patient was cleaned and prepped in a sterile fashion with betadine and 2% lidocaine jelly was instilled into the urethra. A 16 FR coude foley cath was replaced into the bladder no complications were noted. Urine return was noted 169mand urine was yellow in color. The balloon was filled with 1035mf sterile water. A leg bag was attached for drainage.  Patient tolerated well.    Performed by: SamDebroah LoopA-C   Follow up: Return in about 4 weeks (around 06/18/2022) for Catheter exchange.

## 2022-05-25 ENCOUNTER — Ambulatory Visit: Payer: Medicare Other | Admitting: Speech Pathology

## 2022-05-25 DIAGNOSIS — R1312 Dysphagia, oropharyngeal phase: Secondary | ICD-10-CM | POA: Diagnosis not present

## 2022-05-25 DIAGNOSIS — R471 Dysarthria and anarthria: Secondary | ICD-10-CM

## 2022-05-25 NOTE — Therapy (Signed)
OUTPATIENT SPEECH LANGUAGE PATHOLOGY TREATMENT   Patient Name: Mike Holloway MRN: [id number] DOB:1941/03/31, 81 y.o., male Today's Date: 05/25/2022   REFERRING PROVIDER: Bonney Roussel, PA   End of Session - 05/25/22 1520     Visit Number 4    Number of Visits 17    Date for SLP Re-Evaluation 07/14/22    Progress Note Due on Visit 10    SLP Start Time 1300    SLP Stop Time  1400    SLP Time Calculation (min) 60 min    Activity Tolerance Patient tolerated treatment well             Past Medical History:  Diagnosis Date   Arthritis    Atrial flutter ([place])    Diabetes mellitus ([place])    Essential tremor    Essential tremor    deep brain stimulator    Hypercholesteremia    Hypertension    Incontinence    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[place])    grew on the testical   SIADH (syndrome of inappropriate ADH production) ([place])    Sleep apnea    Stroke [facility])    Past Surgical History:  Procedure Laterality Date   ABLATION     APPENDECTOMY     CATARACT EXTRACTION, BILATERAL     COLONOSCOPY WITH PROPOFOL N/A 03/17/2018   Procedure: COLONOSCOPY WITH PROPOFOL;  Surgeon: Jonathon Bellows, MD;  Location: [facility] 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   Procedure: FECAL TRANSPLANT;  Surgeon: Jonathon Bellows, MD;  Location: [facility] ENDOSCOPY;  Service: Gastroenterology;  Laterality: N/A;   HEMORRHOID SURGERY     HERNIA REPAIR     HIP FRACTURE SURGERY     INTRAMEDULLARY (IM) NAIL INTERTROCHANTERIC Right 09/16/2018   Procedure: INTRAMEDULLARY (IM) NAIL INTERTROCHANTRIC;  Surgeon: Hessie Knows, MD;  Location: [facility] ORS;  Service: Orthopedics;  Laterality: Right;   IR CATHETER TUBE CHANGE  11/22/2018   NASAL SINUS SURGERY     ORCHIECTOMY     TONSILLECTOMY     Patient Active Problem List   Diagnosis Date Noted   DVT femoral (deep venous thrombosis) with thrombophlebitis, left ([place]) 02/06/2020   Lymphedema  04/15/2019   Bilateral leg edema 01/25/2019   Hyponatremia 12/24/2018   SIADH (syndrome of inappropriate ADH production) ([place]) 12/24/2018   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) 10/27/2018   Generalized weakness 10/14/2018   Hip fracture ([place]) 09/15/2018   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   Recurrent Clostridium difficile diarrhea 03/24/2018   HLD (hyperlipidemia) 03/24/2018   Contusion of right knee 02/14/2018   Bradycardia 12/28/2017   Status post right unicompartmental knee replacement 11/03/2017   Tremor 09/04/2016   Chronic pain of right knee 08/10/2016   Right ankle pain 08/10/2016   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[place]) 12/11/2015   Urinary retention 09/08/2015   Lymphoma, non-Hodgkin's ([place]) 04/03/2015   Breathlessness on exertion 11/21/2014   Breath shortness 11/21/2014   Arthropathy 11/07/2014   Atrial flutter, paroxysmal ([place]) 11/07/2014   Type 2 diabetes mellitus ([place]) 11/07/2014   Benign essential tremor 11/07/2014   Benign essential HTN 11/07/2014   Mixed incontinence 11/07/2014   Hypercholesterolemia without hypertriglyceridemia 11/07/2014   Apnea, sleep 11/07/2014   Controlled type 2 diabetes mellitus without complication ([place]) 33/54/5625   Pure hypercholesterolemia 11/07/2014   Other abnormalities of gait and mobility 11/02/2011   Decreased mobility 11/02/2011  Abnormal gait 06/29/2011   Discoordination 06/29/2011    ONSET DATE: 04/12/22 (referral date). Dysphagia Onset originally in 2012    REFERRING DIAG: Essential tremor  THERAPY DIAG:  Dysarthria and anarthria  Rationale for Evaluation and Treatment Rehabilitation  SUBJECTIVE:   SUBJECTIVE STATEMENT:  Pt keeping eyes closed most of session due to dry eyes. Pt accompanied by: spouse Pat  PERTINENT HISTORY: Patient is an 81 year old male with past medical history including essential tremor  (since a young age), who underwent left thalamic DBS placement in 2009. 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 Hx also includes DM, atrial flutter s/p ablation, HTN, non-hodgkin lymphoma, CKD, and arthritis. He worked with SLP in 2019 through [place], as well as during subsequent stays at [facility], [place] and with home health. He has been consuming regular diet with thin liquids, and takes pills whole in pudding. Pt referred for SLP evaluation due to dysphagia worsening over the past year. Pt's spouse also reports worsening dysarthria/intelligibility.   DIAGNOSTIC FINDINGS: MBS 07/05/2018: "Patient presents with mild-moderate oropharyngeal dysphagia. He was assessed today with DBS off and then on. I was unable to detect a unequivocal difference between these conditions. With DBS off, he had PAS scores that ranged from 1-4 with thin liquids. With DBS on, he had PAS scores that ranged from 1-3. There was no aspiration observed today. No airway invasion noted with nectar-thick liquids. With DBS on, he was unable to swallow a BASO4 capsule on 2 trials. With this item, he had repetitive lingual movements and was unable to clear the capsule from his oral cavity."  MBS 05/04/22: Patient presents with mild-moderate oropharyngeal dysphagia consistent with prior objective testing. Oral stage is characterized by adequate oral containment and bolus preparation, however, slow and disorganized bolus transport, and repetitive lingual movements with barium tablet (unable to swallow with liquid or puree). Swallow initiation is timely at the level of the valleculae. Tongue base retraction is mildly reduced, leading to mild-moderate valleculae and pyriform residue after the swallow. Airway closure is complete, however trace larygneal penetration occurs when pt takes multiple swallows, as residue spills into the open laryngeal  vestibule between swallows. There is no aspiration. Pt reduces residue with cued dry swallow.   PAIN:  Are you having pain? No  PATIENT GOALS Swallow pills more easily; pt also states that she is concerned with pt's speech intelligibility  OBJECTIVE:   TODAY'S TREATMENT: Skilled ST session focused on dysarthria goals. Pt/spouse report with pt's healing tongue wound, he has been refraining from dysphagia HEP and prefers to address this next session. Initiated training in dysarthria compensations using SLOP (Slow, Loud, Over-pronounce, Pause) strategies. As pt having difficulty reading today due to dry eyes, SLP facilitated session by modeling use of strategies for pt repetition of functional phrases. Pt benefited from audio playback for awareness to improve loudness and slow rate. Pt imitated phrases using strategies for 95% intelligibility with occasional mod cues. Worked with pt and spouse to ID personally relevant phrases for home practice. Pt used SLOP strategies in automatic speech tasks without model 85% acc.   PATIENT EDUCATION: Education details: Swallow HEP, aspiration precautions, swallow twice for liquids. Person educated: Patient and Spouse Education method: Explanation Education comprehension: verbalized understanding   ASSESSMENT:  CLINICAL IMPRESSION: Patient continues with moderate to severe motor speech impairment that is c/b reduced vocal intensity, imprecise articulation, dysprosody and mild-moderate oropharyngeal dysphagia. Patient intelligibility improves  significantly with use of dysarthria compensations, which he is able to imitate at phrase level with moderate cues. Pt requires usual cues for carryover in spontaneous responses. Attempted training in loud /a/ to train vocal effort however productions largely strained/rough in quality. Most effective strategy is cuing for louder speech. Continue skilled ST to improve speech intelligibility and swallowing  function.   OBJECTIVE IMPAIRMENTS include dysarthria and dysphagia. These impairments are limiting patient from effectively communicating at home and in community and safety when swallowing. Factors affecting potential to achieve goals and functional outcome are ability to learn/carryover information, co-morbidities, and previous level of function. Patient will benefit from skilled SLP services to address above impairments and improve overall function.  REHAB POTENTIAL: Fair to good   GOALS: Goals reviewed with patient? Yes  SHORT TERM GOALS: Target date: 10 sessions  Patient will participate in objective swallowing evaluation (MBSS) to identify safest diet recommendation as well as therapeutic targets. Baseline: Goal status: MET  2.  Pt will participate in evaluation of motor speech with intelligibility goals to be added PRN. Baseline:  Goal status: MET  3.  Pt will complete basic HEP for oropharyngeal dysphagia with rare min cues. Baseline:  Goal status: INITIAL  3. With moderate assistance, pt will utilize speech intelligibility strategies (increased vocal intensity and over-articulation) to achieve > 95% speech intelligibility at the simple phrase level across 3 sessions.   Baseline: < 50% speech intelligibility at the simple sentence  Goal status: INITIAL    LONG TERM GOALS: Target date: 07/14/2022 Patient will consume recommended diet using strategies and compensations without overt s/sx aspiration >95% of the time. Baseline:  Goal status: INITIAL  Patient will complete advanced HEP for oropharygneal dysphagia with modified independence. Baseline:  Goal status: INITIAL  3. With minimal assistance, pt will utilize speech intelligibility strategies to achieve ~ 75% intelligibility with common functional phrases.  Baseline: < 50% intelligibility Goal status: INITIAL     PLAN: SLP FREQUENCY:  1-2 x per week  SLP DURATION: 12 weeks  PLANNED INTERVENTIONS:  Aspiration precaution training, Pharyngeal strengthening exercises, Diet toleration management , Environmental controls, Trials of upgraded texture/liquids, Functional tasks, SLP instruction and feedback, Compensatory strategies, and Patient/family education   Deneise Lever, MS, Actor [phone]

## 2022-05-25 NOTE — Patient Instructions (Signed)
Speech Exercises  SLOW, LOUD, OVERPRONOUNCE, PAUSE  I'm ready for my [employer] I'm ready for lunch. Bag! Good night! Sleep well. Would you turn out the light? I need my meds. Can you write for me? I want my white shoes. I want my tan shoes. The mailman's here. I want my yellow shirt. Have you talked to Laurey Arrow) today?  Tongue Twisters:  Call the cat "Buttercup" A calendar of [place], [place] Four floors to cover Yellow oil ointment Fellow lovers of felines Catastrophe in [place]' plums The church's chimes chimed Telling time 'til eleven Five valve levers Keep the gate closed Go see that guy Fat cows give milk [employer] Gophers Fat frogs flip freely Kohl's into bed Get that game to Greg Thick thistles stick together Cinnamon aluminum linoleum Black bugs blood Lovely lemon linament Red leather, yellow leather  Big grocery buggy    Purple baby carriage [place] [facility] Proper copper coffee pot Ripe purple cabbage Three free throws [employer] tackled  [employer] dipped the dessert  [place] that [employer] of [employer] Shirts shrink, shells shouldn't [place] [employer] Take the tackle box File the flash message Give me five flapjacks Fundamental relatives Dye the pets purple Talking [place] time after time Dark chocolate chunks Political landscape of the kingdom Estate manager/land agent genius We played yo-yos yesterday

## 2022-05-27 ENCOUNTER — Encounter: Payer: Medicare Other | Admitting: Speech Pathology

## 2022-06-02 ENCOUNTER — Encounter: Payer: Medicare Other | Admitting: Speech Pathology

## 2022-06-08 ENCOUNTER — Ambulatory Visit: Payer: Medicare Other | Attending: Physician Assistant | Admitting: Speech Pathology

## 2022-06-08 DIAGNOSIS — R471 Dysarthria and anarthria: Secondary | ICD-10-CM | POA: Diagnosis present

## 2022-06-08 DIAGNOSIS — R1312 Dysphagia, oropharyngeal phase: Secondary | ICD-10-CM | POA: Insufficient documentation

## 2022-06-08 NOTE — Therapy (Signed)
OUTPATIENT SPEECH LANGUAGE PATHOLOGY TREATMENT   Patient Name: Mike Holloway MRN: [id number] DOB:[birth year], 81 y.o., male Today's Date: 06/08/2022   REFERRING PROVIDER: Bonney Roussel, [place]   End of Session - 06/08/22 1536     Visit Number 5    Number of Visits 17    Date for SLP Re-Evaluation 07/14/22    Progress Note Due on Visit 10    SLP Start Time 1400    SLP Stop Time  1500    SLP Time Calculation (min) 60 min    Activity Tolerance Patient tolerated treatment well             Past Medical History:  Diagnosis Date   Arthritis    Atrial flutter ([place])    Diabetes mellitus ([place])    Essential tremor    Essential tremor    deep brain stimulator    Hypercholesteremia    Hypertension    Incontinence    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[place])    grew on the testical   SIADH (syndrome of inappropriate ADH production) ([place])    Sleep apnea    Stroke [facility])    Past Surgical History:  Procedure Laterality Date   ABLATION     APPENDECTOMY     CATARACT EXTRACTION, BILATERAL     COLONOSCOPY WITH PROPOFOL N/A 03/17/2018   Procedure: COLONOSCOPY WITH PROPOFOL;  Surgeon: Jonathon Bellows, MD;  Location[facility] 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   Procedure: FECAL TRANSPLANT;  Surgeon: Jonathon Bellows, MD;  Location: [facility] ENDOSCOPY;  Service: Gastroenterology;  Laterality: N/A;   HEMORRHOID SURGERY     HERNIA REPAIR     HIP FRACTURE SURGERY     INTRAMEDULLARY (IM) NAIL INTERTROCHANTERIC Right 09/16/2018   Procedure: INTRAMEDULLARY (IM) NAIL INTERTROCHANTRIC;  Surgeon: Hessie Knows, MD;  Location: [facility] ORS;  Service: Orthopedics;  Laterality: Right;   IR CATHETER TUBE CHANGE  11/22/2018   NASAL SINUS SURGERY     ORCHIECTOMY     TONSILLECTOMY     Patient Active Problem List   Diagnosis Date Noted   DVT femoral (deep venous thrombosis) with thrombophlebitis, left ([place]) 02/06/2020   Lymphedema  04/15/2019   Bilateral leg edema 01/25/2019   Hyponatremia 12/24/2018   SIADH (syndrome of inappropriate ADH production) ([place]) 12/24/2018   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) 10/27/2018   Generalized weakness 10/14/2018   Hip fracture ([place]) 09/15/2018   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   Recurrent Clostridium difficile diarrhea 03/24/2018   HLD (hyperlipidemia) 03/24/2018   Contusion of right knee 02/14/2018   Bradycardia 12/28/2017   Status post right unicompartmental knee replacement 11/03/2017   Tremor 09/04/2016   Chronic pain of right knee 08/10/2016   Right ankle pain 08/10/2016   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   Non-Hodgkin's lymphoma ([place]) 12/11/2015   Urinary retention 09/08/2015   Lymphoma, non-Hodgkin's ([place]) 04/03/2015   Breathlessness on exertion 11/21/2014   Breath shortness 11/21/2014   Arthropathy 11/07/2014   Atrial flutter, paroxysmal ([place]) 11/07/2014   Type 2 diabetes mellitus ([place]) 11/07/2014   Benign essential tremor 11/07/2014   Benign essential HTN 11/07/2014   Mixed incontinence 11/07/2014   Hypercholesterolemia without hypertriglyceridemia 11/07/2014   Apnea, sleep 11/07/2014   Controlled type 2 diabetes mellitus without complication ([place]) 35/32/9924   Pure hypercholesterolemia 11/07/2014   Other abnormalities of gait and mobility 11/02/2011   Decreased mobility 11/02/2011  Abnormal gait 06/29/2011   Discoordination 06/29/2011    ONSET DATE: 04/12/22 (referral date). Dysphagia Onset originally in 2012    REFERRING DIAG: Essential tremor  THERAPY DIAG:  Dysarthria and anarthria  Oropharyngeal dysphagia  Rationale for Evaluation and Treatment Rehabilitation  SUBJECTIVE:   SUBJECTIVE STATEMENT:  Spouse reports pt swallowing difficulty with breakfast Pt accompanied by: spouse Pat  PERTINENT HISTORY: Patient is an 81 year old male with past medical history  including essential tremor (since a young age), who underwent left thalamic DBS placement in 2009. 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 Hx also includes DM, atrial flutter s/p ablation, HTN, non-hodgkin lymphoma, CKD, and arthritis. He worked with SLP in 2019 through [place], as well as during subsequent stays at [facility], [place] and with home health. He has been consuming regular diet with thin liquids, and takes pills whole in pudding. Pt referred for SLP evaluation due to dysphagia worsening over the past year. Pt's spouse also reports worsening dysarthria/intelligibility.   DIAGNOSTIC FINDINGS: MBS 07/05/2018: "Patient presents with mild-moderate oropharyngeal dysphagia. He was assessed today with DBS off and then on. I was unable to detect a unequivocal difference between these conditions. With DBS off, he had PAS scores that ranged from 1-4 with thin liquids. With DBS on, he had PAS scores that ranged from 1-3. There was no aspiration observed today. No airway invasion noted with nectar-thick liquids. With DBS on, he was unable to swallow a BASO4 capsule on 2 trials. With this item, he had repetitive lingual movements and was unable to clear the capsule from his oral cavity."  MBS 05/04/22: Patient presents with mild-moderate oropharyngeal dysphagia consistent with prior objective testing. Oral stage is characterized by adequate oral containment and bolus preparation, however, slow and disorganized bolus transport, and repetitive lingual movements with barium tablet (unable to swallow with liquid or puree). Swallow initiation is timely at the level of the valleculae. Tongue base retraction is mildly reduced, leading to mild-moderate valleculae and pyriform residue after the swallow. Airway closure is complete, however trace larygneal penetration occurs when pt takes multiple swallows, as residue spills into  the open laryngeal vestibule between swallows. There is no aspiration. Pt reduces residue with cued dry swallow.   PAIN:  Are you having pain? No  PATIENT GOALS Swallow pills more easily; pt also states that she is concerned with pt's speech intelligibility  OBJECTIVE:   TODAY'S TREATMENT: Skilled ST session focused on dysarthria and dysphagia goals. Pt/spouse report swallowing difficulty occurs only with pills and at breakfast time. With further questioning, appears pt encountering difficulty with cereal (Raisin Bran Crunch, with milk). Education provided on swallow compensations and draining milk from spoon due to disorganization, reduced coordination in oral phase seen on MBS. Pt to bring cereal to next session for diagnostic trials. Patient completed dysarthria HEP with occasional min cues for pausing. Pt required more moderate cues with using dysarthria compensations with his personally relevant phrases. Continues to benefit from audio feedback for awareness. Spouse reported communication breakdown when transcribing email addresses for pt. SLP worked with pt on similar task, using strategy of pausing between letters to increase clarity. When using strategies, SLP needed to request clarification x1 out of 10 email addresses. When pt used his normal rate, SLP transcribed 2 errors in a single address. Education to pt and spouse on cues that may be helpful in this activity at home.   PATIENT EDUCATION:  Education details: intelligibility strategies, caution with mixed consistencies Person educated: Patient and Spouse Education method: Explanation Education comprehension: verbalized understanding   ASSESSMENT:  CLINICAL IMPRESSION: Patient continues with moderate to severe motor speech impairment that is c/b reduced vocal intensity, imprecise articulation, dysprosody and mild-moderate oropharyngeal dysphagia. Per spouse, pt exhibiting overt s/sx aspiration with mixed consistency at home  (cereal/milk). Pt to bring cereal from home next visit for assessment in session. Patient intelligibility improves significantly with use of dysarthria compensations, which he is able to use at phrase level with min cues in structured tasks. Pt requires usual cues for carryover in spontaneous responses. Most effective cues are for louder speech, pausing. Continue skilled ST to improve speech intelligibility and swallowing function.   OBJECTIVE IMPAIRMENTS include dysarthria and dysphagia. These impairments are limiting patient from effectively communicating at home and in community and safety when swallowing. Factors affecting potential to achieve goals and functional outcome are ability to learn/carryover information, co-morbidities, and previous level of function. Patient will benefit from skilled SLP services to address above impairments and improve overall function.  REHAB POTENTIAL: Fair to good   GOALS: Goals reviewed with patient? Yes  SHORT TERM GOALS: Target date: 10 sessions  Patient will participate in objective swallowing evaluation (MBSS) to identify safest diet recommendation as well as therapeutic targets. Baseline: Goal status: MET  2.  Pt will participate in evaluation of motor speech with intelligibility goals to be added PRN. Baseline:  Goal status: MET  3.  Pt will complete basic HEP for oropharyngeal dysphagia with rare min cues. Baseline:  Goal status: INITIAL  3. With moderate assistance, pt will utilize speech intelligibility strategies (increased vocal intensity and over-articulation) to achieve > 95% speech intelligibility at the simple phrase level across 3 sessions.   Baseline: < 50% speech intelligibility at the simple sentence  Goal status: INITIAL    LONG TERM GOALS: Target date: 07/14/2022 Patient will consume recommended diet using strategies and compensations without overt s/sx aspiration >95% of the time. Baseline:  Goal status: INITIAL  Patient  will complete advanced HEP for oropharygneal dysphagia with modified independence. Baseline:  Goal status: INITIAL  3. With minimal assistance, pt will utilize speech intelligibility strategies to achieve ~ 75% intelligibility with common functional phrases.  Baseline: < 50% intelligibility Goal status: INITIAL     PLAN:   SLP FREQUENCY:  1-2 x per week  SLP DURATION: 12 weeks  PLANNED INTERVENTIONS: Aspiration precaution training, Pharyngeal strengthening exercises, Diet toleration management , Environmental controls, Trials of upgraded texture/liquids, Functional tasks, SLP instruction and feedback, Compensatory strategies, and Patient/family education   Deneise Lever, MS, Actor [phone]

## 2022-06-09 ENCOUNTER — Other Ambulatory Visit: Payer: Self-pay | Admitting: Physician Assistant

## 2022-06-09 DIAGNOSIS — N3289 Other specified disorders of bladder: Secondary | ICD-10-CM

## 2022-06-10 ENCOUNTER — Encounter: Payer: Medicare Other | Admitting: Speech Pathology

## 2022-06-15 ENCOUNTER — Ambulatory Visit: Payer: Medicare Other | Admitting: Speech Pathology

## 2022-06-15 DIAGNOSIS — R471 Dysarthria and anarthria: Secondary | ICD-10-CM | POA: Diagnosis not present

## 2022-06-15 DIAGNOSIS — R1312 Dysphagia, oropharyngeal phase: Secondary | ICD-10-CM

## 2022-06-15 NOTE — Therapy (Signed)
OUTPATIENT SPEECH LANGUAGE PATHOLOGY TREATMENT   Patient Name: Mike Holloway MRN: [id number] DOB:02-Oct-1940, 81 y.o., male Today's Date: 06/15/2022   REFERRING PROVIDER: Bonney Roussel, PA   End of Session - 06/15/22 1413     Visit Number 6    Number of Visits 17    Date for SLP Re-Evaluation 07/14/22    Progress Note Due on Visit 10    SLP Start Time 1500    SLP Stop Time  1600    SLP Time Calculation (min) 60 min    Activity Tolerance Patient tolerated treatment well             Past Medical History:  Diagnosis Date   Arthritis    Atrial flutter ([place])    Diabetes mellitus ([place])    Essential tremor    Essential tremor    deep brain stimulator    Hypercholesteremia    Hypertension    Incontinence    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 on the testical   SIADH (syndrome of inappropriate ADH production) ([place])    Sleep apnea    Stroke [facility])    Past Surgical History:  Procedure Laterality Date   ABLATION     APPENDECTOMY     CATARACT EXTRACTION, BILATERAL     COLONOSCOPY WITH PROPOFOL N/A 03/17/2018   Procedure: COLONOSCOPY WITH PROPOFOL;  Surgeon: Jonathon Bellows, MD;  Location: [facility] 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   Procedure: FECAL TRANSPLANT;  Surgeon: Jonathon Bellows, MD;  Location[facility] ENDOSCOPY;  Service: Gastroenterology;  Laterality: N/A;   HEMORRHOID SURGERY     HERNIA REPAIR     HIP FRACTURE SURGERY     INTRAMEDULLARY (IM) NAIL INTERTROCHANTERIC Right 09/16/2018   Procedure: INTRAMEDULLARY (IM) NAIL INTERTROCHANTRIC;  Surgeon: Hessie Knows, MD;  Location: [facility] ORS;  Service: Orthopedics;  Laterality: Right;   IR CATHETER TUBE CHANGE  11/22/2018   NASAL SINUS SURGERY     ORCHIECTOMY     TONSILLECTOMY     Patient Active Problem List   Diagnosis Date Noted   DVT femoral (deep venous thrombosis) with thrombophlebitis, left ([place]) 02/06/2020   Lymphedema  04/15/2019   Bilateral leg edema 01/25/2019   Hyponatremia 12/24/2018   SIADH (syndrome of inappropriate ADH production) ([place]) 12/24/2018   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) 10/27/2018   Generalized weakness 10/14/2018   Hip fracture ([place]) 09/15/2018   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   Recurrent Clostridium difficile diarrhea 03/24/2018   HLD (hyperlipidemia) 03/24/2018   Contusion of right knee 02/14/2018   Bradycardia 12/28/2017   Status post right unicompartmental knee replacement 11/03/2017   Tremor 09/04/2016   Chronic pain of right knee 08/10/2016   Right ankle pain 08/10/2016   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   Non-Hodgkin's lymphoma ([place]) 12/11/2015   Urinary retention 09/08/2015   Lymphoma, non-Hodgkin's ([place]) 04/03/2015   Breathlessness on exertion 11/21/2014   Breath shortness 11/21/2014   Arthropathy 11/07/2014   Atrial flutter, paroxysmal ([place]) 11/07/2014   Type 2 diabetes mellitus ([place]) 11/07/2014   Benign essential tremor 11/07/2014   Benign essential HTN 11/07/2014   Mixed incontinence 11/07/2014   Hypercholesterolemia without hypertriglyceridemia 11/07/2014   Apnea, sleep 11/07/2014   Controlled type 2 diabetes mellitus without complication ([place]) 26/71/2458   Pure hypercholesterolemia 11/07/2014   Other abnormalities of gait and mobility 11/02/2011   Decreased mobility 11/02/2011  Abnormal gait 06/29/2011   Discoordination 06/29/2011    ONSET DATE: 04/12/22 (referral date). Dysphagia Onset originally in 2012    REFERRING DIAG: Essential tremor  THERAPY DIAG:  Dysarthria and anarthria  Oropharyngeal dysphagia  Rationale for Evaluation and Treatment Rehabilitation  SUBJECTIVE:   SUBJECTIVE STATEMENT:  Patient brought his cereal and milk today Pt accompanied by: spouse [name]  PERTINENT HISTORY: Patient is an 81 year old male with past medical history including  essential tremor (since a young age), who underwent left thalamic DBS placement in 2009. 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 Hx also includes DM, atrial flutter s/p ablation, HTN, non-hodgkin lymphoma, CKD, and arthritis. He worked with SLP in 2019 through [place], as well as during subsequent stays at [facility], [place] and with home health. He has been consuming regular diet with thin liquids, and takes pills whole in pudding. Pt referred for SLP evaluation due to dysphagia worsening over the past year. Pt's spouse also reports worsening dysarthria/intelligibility.   DIAGNOSTIC FINDINGS: MBS 07/05/2018: "Patient presents with mild-moderate oropharyngeal dysphagia. He was assessed today with DBS off and then on. I was unable to detect a unequivocal difference between these conditions. With DBS off, he had PAS scores that ranged from 1-4 with thin liquids. With DBS on, he had PAS scores that ranged from 1-3. There was no aspiration observed today. No airway invasion noted with nectar-thick liquids. With DBS on, he was unable to swallow a BASO4 capsule on 2 trials. With this item, he had repetitive lingual movements and was unable to clear the capsule from his oral cavity."  MBS 05/04/22: Patient presents with mild-moderate oropharyngeal dysphagia consistent with prior objective testing. Oral stage is characterized by adequate oral containment and bolus preparation, however, slow and disorganized bolus transport, and repetitive lingual movements with barium tablet (unable to swallow with liquid or puree). Swallow initiation is timely at the level of the valleculae. Tongue base retraction is mildly reduced, leading to mild-moderate valleculae and pyriform residue after the swallow. Airway closure is complete, however trace larygneal penetration occurs when pt takes multiple swallows, as residue spills into the open  laryngeal vestibule between swallows. There is no aspiration. Pt reduces residue with cued dry swallow.   PAIN:  Are you having pain? No  PATIENT GOALS Swallow pills more easily; pt also states that she is concerned with pt's speech intelligibility  OBJECTIVE:   TODAY'S TREATMENT: Skilled ST session focused on dysarthria and dysphagia goals. SLP provided skilled observation of pt's typical breakfast food, due to difficulties reported with this at home. Patient self-fed mixed consistency and appeared grossly functional with this, aside from 1 instance when distracted. Reinforced use of general aspiration precautions and use of caution with mixed consistencies (drain milk from spoon, reduce environmental distractions, complete oral clearance before additional bites/sips). Pt continues to report mild soreness (improving) of his tongue and will defer exercises to future session.  Dysarthria treatment with focus on use of intelligibility compensations. Patient read personal phrases with use of strategies 80% acc, required occasional mod cues for louder speech, pausing. Progressed to 1-2 sentence message reading, with occasional mod cues for use of strategies.    PATIENT EDUCATION: Education details: intelligibility strategies, caution with mixed consistencies Person educated: Patient and Spouse Education method: Explanation Education comprehension: verbalized understanding   ASSESSMENT:  CLINICAL IMPRESSION: Patient continues with moderate to severe motor speech impairment that is c/b reduced vocal  intensity, imprecise articulation, dysprosody and mild-moderate oropharyngeal dysphagia. When following aspiration precautions, pt did not have any overt s/sx aspiration with mixed consistencies. Patient intelligibility improves significantly with use of dysarthria compensations, which he is able to use at 1-2 sentence level with mod cues in structured tasks. Pt requires usual cues for carryover in  spontaneous responses. Most effective cues are for louder speech, pausing. Continue skilled ST to improve speech intelligibility and swallowing function.   OBJECTIVE IMPAIRMENTS include dysarthria and dysphagia. These impairments are limiting patient from effectively communicating at home and in community and safety when swallowing. Factors affecting potential to achieve goals and functional outcome are ability to learn/carryover information, co-morbidities, and previous level of function. Patient will benefit from skilled SLP services to address above impairments and improve overall function.  REHAB POTENTIAL: Fair to good   GOALS: Goals reviewed with patient? Yes  SHORT TERM GOALS: Target date: 10 sessions  Patient will participate in objective swallowing evaluation (MBSS) to identify safest diet recommendation as well as therapeutic targets. Baseline: Goal status: MET  2.  Pt will participate in evaluation of motor speech with intelligibility goals to be added PRN. Baseline:  Goal status: MET  3.  Pt will complete basic HEP for oropharyngeal dysphagia with rare min cues. Baseline:  Goal status: INITIAL  3. With moderate assistance, pt will utilize speech intelligibility strategies (increased vocal intensity and over-articulation) to achieve > 95% speech intelligibility at the simple phrase level across 3 sessions.   Baseline: < 50% speech intelligibility at the simple sentence  Goal status: INITIAL    LONG TERM GOALS: Target date: 07/14/2022 Patient will consume recommended diet using strategies and compensations without overt s/sx aspiration >95% of the time. Baseline:  Goal status: INITIAL  Patient will complete advanced HEP for oropharygneal dysphagia with modified independence. Baseline:  Goal status: INITIAL  3. With minimal assistance, pt will utilize speech intelligibility strategies to achieve ~ 75% intelligibility with common functional phrases.  Baseline: < 50%  intelligibility Goal status: INITIAL     PLAN:   SLP FREQUENCY:  1-2 x per week  SLP DURATION: 12 weeks  PLANNED INTERVENTIONS: Aspiration precaution training, Pharyngeal strengthening exercises, Diet toleration management , Environmental controls, Trials of upgraded texture/liquids, Functional tasks, SLP instruction and feedback, Compensatory strategies, and Patient/family education   Deneise Lever, MS, Actor [phone]

## 2022-06-17 ENCOUNTER — Encounter: Payer: Medicare Other | Admitting: Speech Pathology

## 2022-06-18 ENCOUNTER — Ambulatory Visit (INDEPENDENT_AMBULATORY_CARE_PROVIDER_SITE_OTHER): Payer: Medicare Other | Admitting: Physician Assistant

## 2022-06-18 DIAGNOSIS — R339 Retention of urine, unspecified: Secondary | ICD-10-CM

## 2022-06-18 DIAGNOSIS — Z466 Encounter for fitting and adjustment of urinary device: Secondary | ICD-10-CM

## 2022-06-18 DIAGNOSIS — R310 Gross hematuria: Secondary | ICD-10-CM

## 2022-06-18 NOTE — Progress Notes (Signed)
Cath Change/ Replacement  Patient is present today for a catheter change due to urinary retention.  58m of water was removed from the balloon, a 16FR coude foley cath was removed without difficulty.  Patient was cleaned and prepped in a sterile fashion with betadine and 2% lidocaine jelly was instilled into the urethra. A 16 FR coude foley cath was replaced into the bladder, no complications were noted. Urine return was noted 253mand urine was yellow in color. The balloon was filled with 1033mf sterile water. A leg bag was attached for drainage.  Patient tolerated well.    Performed by: SamDebroah LoopA-C  Additional notes: Mrs. BorRaduntinues to report intermittent blood clots in his drainage bag. I offered them cystoscopy for further evaluation as this has been an ongoing problem and they accepted. Last cysto 2016.  Follow up: Return in about 4 weeks (around 07/16/2022) for Cysto and cath change with Dr. StoBernardo Heater

## 2022-06-22 ENCOUNTER — Ambulatory Visit: Payer: Medicare Other | Admitting: Speech Pathology

## 2022-06-22 DIAGNOSIS — R1312 Dysphagia, oropharyngeal phase: Secondary | ICD-10-CM

## 2022-06-22 DIAGNOSIS — R471 Dysarthria and anarthria: Secondary | ICD-10-CM | POA: Diagnosis not present

## 2022-06-22 NOTE — Therapy (Signed)
OUTPATIENT SPEECH LANGUAGE PATHOLOGY TREATMENT   Patient Name: Mike Holloway MRN: [id number] DOB:11-19-1940, 81 y.o., male Today's Date: 06/22/2022   REFERRING PROVIDER: Bonney Roussel, PA   End of Session - 06/22/22 1305     Visit Number 7    Number of Visits 17    Date for SLP Re-Evaluation 07/14/22    Progress Note Due on Visit 10    SLP Start Time 1300    SLP Stop Time  1400    SLP Time Calculation (min) 60 min    Activity Tolerance Patient tolerated treatment well             Past Medical History:  Diagnosis Date   Arthritis    Atrial flutter ([place])    Diabetes mellitus ([place])    Essential tremor    Essential tremor    deep brain stimulator    Hypercholesteremia    Hypertension    Incontinence    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[place])    grew on the testical   SIADH (syndrome of inappropriate ADH production) ([place])    Sleep apnea    Stroke [facility])    Past Surgical History:  Procedure Laterality Date   ABLATION     APPENDECTOMY     CATARACT EXTRACTION, BILATERAL     COLONOSCOPY WITH PROPOFOL N/A 03/17/2018   Procedure: COLONOSCOPY WITH PROPOFOL;  Surgeon: Jonathon Bellows, MD;  Location: [facility] 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   Procedure: FECAL TRANSPLANT;  Surgeon: Jonathon Bellows, MD;  Location: [facility] ENDOSCOPY;  Service: Gastroenterology;  Laterality: N/A;   HEMORRHOID SURGERY     HERNIA REPAIR     HIP FRACTURE SURGERY     INTRAMEDULLARY (IM) NAIL INTERTROCHANTERIC Right 09/16/2018   Procedure: INTRAMEDULLARY (IM) NAIL INTERTROCHANTRIC;  Surgeon: Hessie Knows, MD;  Location: [facility] ORS;  Service: Orthopedics;  Laterality: Right;   IR CATHETER TUBE CHANGE  11/22/2018   NASAL SINUS SURGERY     ORCHIECTOMY     TONSILLECTOMY     Patient Active Problem List   Diagnosis Date Noted   DVT femoral (deep venous thrombosis) with thrombophlebitis, left ([place]) 02/06/2020   Lymphedema  04/15/2019   Bilateral leg edema 01/25/2019   Hyponatremia 12/24/2018   SIADH (syndrome of inappropriate ADH production) ([place]) 12/24/2018   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) 10/27/2018   Generalized weakness 10/14/2018   Hip fracture ([place]) 09/15/2018   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   Recurrent Clostridium difficile diarrhea 03/24/2018   HLD (hyperlipidemia) 03/24/2018   Contusion of right knee 02/14/2018   Bradycardia 12/28/2017   Status post right unicompartmental knee replacement 11/03/2017   Tremor 09/04/2016   Chronic pain of right knee 08/10/2016   Right ankle pain 08/10/2016   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   Non-Hodgkin's lymphoma ([place]) 12/11/2015   Urinary retention 09/08/2015   Lymphoma, non-Hodgkin's ([place]) 04/03/2015   Breathlessness on exertion 11/21/2014   Breath shortness 11/21/2014   Arthropathy 11/07/2014   Atrial flutter, paroxysmal ([place]) 11/07/2014   Type 2 diabetes mellitus ([place]) 11/07/2014   Benign essential tremor 11/07/2014   Benign essential HTN 11/07/2014   Mixed incontinence 11/07/2014   Hypercholesterolemia without hypertriglyceridemia 11/07/2014   Apnea, sleep 11/07/2014   Controlled type 2 diabetes mellitus without complication ([place]) 77/41/2878   Pure hypercholesterolemia 11/07/2014   Other abnormalities of gait and mobility 11/02/2011   Decreased mobility 11/02/2011  Abnormal gait 06/29/2011   Discoordination 06/29/2011    ONSET DATE: 04/12/22 (referral date). Dysphagia Onset originally in 2012    REFERRING DIAG: Essential tremor  THERAPY DIAG:  Dysarthria and anarthria  Oropharyngeal dysphagia  Rationale for Evaluation and Treatment Rehabilitation  SUBJECTIVE:   SUBJECTIVE STATEMENT:  Pt reports no difficulty with foods, including cereal/milk, over the past week. Pt accompanied by: spouse Pat  PERTINENT HISTORY: Patient is an 81 year old male with  past medical history including essential tremor (since a young age), who underwent left thalamic DBS placement in 2009. 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 Hx also includes DM, atrial flutter s/p ablation, HTN, non-hodgkin lymphoma, CKD, and arthritis. He worked with SLP in 2019 through [place], as well as during subsequent stays at [facility], [place] and with home health. He has been consuming regular diet with thin liquids, and takes pills whole in pudding. Pt referred for SLP evaluation due to dysphagia worsening over the past year. Pt's spouse also reports worsening dysarthria/intelligibility.   DIAGNOSTIC FINDINGS: MBS 07/05/2018: "Patient presents with mild-moderate oropharyngeal dysphagia. He was assessed today with DBS off and then on. I was unable to detect a unequivocal difference between these conditions. With DBS off, he had PAS scores that ranged from 1-4 with thin liquids. With DBS on, he had PAS scores that ranged from 1-3. There was no aspiration observed today. No airway invasion noted with nectar-thick liquids. With DBS on, he was unable to swallow a BASO4 capsule on 2 trials. With this item, he had repetitive lingual movements and was unable to clear the capsule from his oral cavity."  MBS 05/04/22: Patient presents with mild-moderate oropharyngeal dysphagia consistent with prior objective testing. Oral stage is characterized by adequate oral containment and bolus preparation, however, slow and disorganized bolus transport, and repetitive lingual movements with barium tablet (unable to swallow with liquid or puree). Swallow initiation is timely at the level of the valleculae. Tongue base retraction is mildly reduced, leading to mild-moderate valleculae and pyriform residue after the swallow. Airway closure is complete, however trace larygneal penetration occurs when pt takes multiple swallows, as  residue spills into the open laryngeal vestibule between swallows. There is no aspiration. Pt reduces residue with cued dry swallow.   PAIN:  Are you having pain? No  PATIENT GOALS Swallow pills more easily; pt also states that she is concerned with pt's speech intelligibility  OBJECTIVE:   TODAY'S TREATMENT: Skilled ST session focused on dysarthria and dysphagia goals. Pt reports not completing lingual sweep exercise due to persisting pain from tongue injury. Remains bruised but wound appears healed. Pt not completing repetitions of effortful swallow at home; pt completed 20 reps today with moderately extended time (~3 minutes). Reinforced rationale for repetitions and practice at home. He is using effortful swallow for his medications, which he reports is helpful. Spouse asked about using dB app with pt to help him focus on loudness. SLP advised using effort level as an internal guide vs dB app, which may be difficult for pt to focus on and give inconsistent readings as distance from the meter must remain constant for accuracy. With pt's habitual effort rated 4-5, he was able to use effort of 8/10 for reading his functional phrases with significant improvement in vocal quality and >95% intelligibility. Targeted use of this effort in short conversational responses, with occasional moderate cues required for effort level. Discussed cuing strategies  for home and reinforced that pt must practice daily (and spouse should remind pt to use effort level 8 when he is speaking with her).    PATIENT EDUCATION: Education details: intelligibility strategies, caution with mixed consistencies Person educated: Patient and Spouse Education method: Explanation Education comprehension: verbalized understanding   ASSESSMENT:  CLINICAL IMPRESSION: Patient continues with moderate to severe motor speech impairment that is c/b reduced vocal intensity, imprecise articulation, dysprosody and mild-moderate  oropharyngeal dysphagia. Patient intelligibility improves significantly with use of dysarthria compensations, which he is able to use at 1-2 sentence level with mod cues in structured tasks. Pt continues to require cues for carryover in spontaneous responses. Most effective cues are for louder speech, pausing. Continue skilled ST to improve speech intelligibility and swallowing function.   OBJECTIVE IMPAIRMENTS include dysarthria and dysphagia. These impairments are limiting patient from effectively communicating at home and in community and safety when swallowing. Factors affecting potential to achieve goals and functional outcome are ability to learn/carryover information, co-morbidities, and previous level of function. Patient will benefit from skilled SLP services to address above impairments and improve overall function.  REHAB POTENTIAL: Fair to good   GOALS: Goals reviewed with patient? Yes  SHORT TERM GOALS: Target date: 10 sessions  Patient will participate in objective swallowing evaluation (MBSS) to identify safest diet recommendation as well as therapeutic targets. Baseline: Goal status: MET  2.  Pt will participate in evaluation of motor speech with intelligibility goals to be added PRN. Baseline:  Goal status: MET  3.  Pt will complete basic HEP for oropharyngeal dysphagia with rare min cues. Baseline:  Goal status: INITIAL  3. With moderate assistance, pt will utilize speech intelligibility strategies (increased vocal intensity and over-articulation) to achieve > 95% speech intelligibility at the simple phrase level across 3 sessions.   Baseline: < 50% speech intelligibility at the simple sentence  Goal status: INITIAL    LONG TERM GOALS: Target date: 07/14/2022 Patient will consume recommended diet using strategies and compensations without overt s/sx aspiration >95% of the time. Baseline:  Goal status: INITIAL  Patient will complete advanced HEP for oropharygneal  dysphagia with modified independence. Baseline:  Goal status: INITIAL  3. With minimal assistance, pt will utilize speech intelligibility strategies to achieve ~ 75% intelligibility with common functional phrases.  Baseline: < 50% intelligibility Goal status: INITIAL     PLAN:   SLP FREQUENCY:  1-2 x per week  SLP DURATION: 12 weeks  PLANNED INTERVENTIONS: Aspiration precaution training, Pharyngeal strengthening exercises, Diet toleration management , Environmental controls, Trials of upgraded texture/liquids, Functional tasks, SLP instruction and feedback, Compensatory strategies, and Patient/family education   Deneise Lever, MS, Actor [phone]

## 2022-06-24 ENCOUNTER — Encounter: Payer: Medicare Other | Admitting: Speech Pathology

## 2022-06-29 ENCOUNTER — Ambulatory Visit: Payer: Medicare Other | Attending: Physician Assistant | Admitting: Speech Pathology

## 2022-06-29 DIAGNOSIS — R1312 Dysphagia, oropharyngeal phase: Secondary | ICD-10-CM | POA: Diagnosis present

## 2022-06-29 DIAGNOSIS — R471 Dysarthria and anarthria: Secondary | ICD-10-CM | POA: Diagnosis present

## 2022-06-29 NOTE — Therapy (Signed)
OUTPATIENT SPEECH LANGUAGE PATHOLOGY TREATMENT   Patient Name: Mike Holloway MRN: [id number] DOB:[birth year], 81 y.o., male Today's Date: 06/29/2022   REFERRING PROVIDER: Bonney Roussel, PA   End of Session - 06/29/22 1322     Visit Number 8    Number of Visits 17    Date for SLP Re-Evaluation 07/14/22    Progress Note Due on Visit 10    SLP Start Time 1300    SLP Stop Time  1400    SLP Time Calculation (min) 60 min    Activity Tolerance Patient tolerated treatment well             Past Medical History:  Diagnosis Date   Arthritis    Atrial flutter ([place])    Diabetes mellitus ([place])    Essential tremor    Essential tremor    deep brain stimulator    Hypercholesteremia    Hypertension    Incontinence    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 on the testical   SIADH (syndrome of inappropriate ADH production) ([place])    Sleep apnea    Stroke [facility])    Past Surgical History:  Procedure Laterality Date   ABLATION     APPENDECTOMY     CATARACT EXTRACTION, BILATERAL     COLONOSCOPY WITH PROPOFOL N/A 03/17/2018   Procedure: COLONOSCOPY WITH PROPOFOL;  Surgeon: Jonathon Bellows, MD;  Location: [facility] 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   Procedure: FECAL TRANSPLANT;  Surgeon: Jonathon Bellows, MD;  Location: [facility] ENDOSCOPY;  Service: Gastroenterology;  Laterality: N/A;   HEMORRHOID SURGERY     HERNIA REPAIR     HIP FRACTURE SURGERY     INTRAMEDULLARY (IM) NAIL INTERTROCHANTERIC Right 09/16/2018   Procedure: INTRAMEDULLARY (IM) NAIL INTERTROCHANTRIC;  Surgeon: Hessie Knows, MD;  Location: [facility] ORS;  Service: Orthopedics;  Laterality: Right;   IR CATHETER TUBE CHANGE  11/22/2018   NASAL SINUS SURGERY     ORCHIECTOMY     TONSILLECTOMY     Patient Active Problem List   Diagnosis Date Noted   DVT femoral (deep venous thrombosis) with thrombophlebitis, left ([place]) 02/06/2020   Lymphedema  04/15/2019   Bilateral leg edema 01/25/2019   Hyponatremia 12/24/2018   SIADH (syndrome of inappropriate ADH production) ([place]) 12/24/2018   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) 10/27/2018   Generalized weakness 10/14/2018   Hip fracture ([place]) 09/15/2018   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   Recurrent Clostridium difficile diarrhea 03/24/2018   HLD (hyperlipidemia) 03/24/2018   Contusion of right knee 02/14/2018   Bradycardia 12/28/2017   Status post right unicompartmental knee replacement 11/03/2017   Tremor 09/04/2016   Chronic pain of right knee 08/10/2016   Right ankle pain 08/10/2016   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   Non-Hodgkin's lymphoma ([place]) 12/11/2015   Urinary retention 09/08/2015   Lymphoma, non-Hodgkin's ([place]) 04/03/2015   Breathlessness on exertion 11/21/2014   Breath shortness 11/21/2014   Arthropathy 11/07/2014   Atrial flutter, paroxysmal ([place]) 11/07/2014   Type 2 diabetes mellitus ([place]) 11/07/2014   Benign essential tremor 11/07/2014   Benign essential HTN 11/07/2014   Mixed incontinence 11/07/2014   Hypercholesterolemia without hypertriglyceridemia 11/07/2014   Apnea, sleep 11/07/2014   Controlled type 2 diabetes mellitus without complication ([place]) 93/23/5573   Pure hypercholesterolemia 11/07/2014   Other abnormalities of gait and mobility 11/02/2011   Decreased mobility 11/02/2011  Abnormal gait 06/29/2011   Discoordination 06/29/2011    ONSET DATE: 04/12/22 (referral date). Dysphagia Onset originally in 2012    REFERRING DIAG: Essential tremor  THERAPY DIAG:  Dysarthria and anarthria  Oropharyngeal dysphagia  Rationale for Evaluation and Treatment Rehabilitation  SUBJECTIVE:   SUBJECTIVE STATEMENT:  Coughed at end of meal when eating spaghetti yesterday.  Pt accompanied by: spouse [name]  PERTINENT HISTORY: Patient is an 81 year old male with past medical history  including essential tremor (since a young age), who underwent left thalamic DBS placement in 2009. 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 Hx also includes DM, atrial flutter s/p ablation, HTN, non-hodgkin lymphoma, CKD, and arthritis. He worked with SLP in 2019 through [place], as well as during subsequent stays at [facility], [place] and with home health. He has been consuming regular diet with thin liquids, and takes pills whole in pudding. Pt referred for SLP evaluation due to dysphagia worsening over the past year. Pt's spouse also reports worsening dysarthria/intelligibility.   DIAGNOSTIC FINDINGS: MBS 07/05/2018: "Patient presents with mild-moderate oropharyngeal dysphagia. He was assessed today with DBS off and then on. I was unable to detect a unequivocal difference between these conditions. With DBS off, he had PAS scores that ranged from 1-4 with thin liquids. With DBS on, he had PAS scores that ranged from 1-3. There was no aspiration observed today. No airway invasion noted with nectar-thick liquids. With DBS on, he was unable to swallow a BASO4 capsule on 2 trials. With this item, he had repetitive lingual movements and was unable to clear the capsule from his oral cavity."  MBS 05/04/22: Patient presents with mild-moderate oropharyngeal dysphagia consistent with prior objective testing. Oral stage is characterized by adequate oral containment and bolus preparation, however, slow and disorganized bolus transport, and repetitive lingual movements with barium tablet (unable to swallow with liquid or puree). Swallow initiation is timely at the level of the valleculae. Tongue base retraction is mildly reduced, leading to mild-moderate valleculae and pyriform residue after the swallow. Airway closure is complete, however trace larygneal penetration occurs when pt takes multiple swallows, as residue spills into  the open laryngeal vestibule between swallows. There is no aspiration. Pt reduces residue with cued dry swallow.   PAIN:  Are you having pain? No  PATIENT GOALS Swallow pills more easily; pt also states that she is concerned with pt's speech intelligibility  OBJECTIVE:   TODAY'S TREATMENT: Skilled ST session focused on dysarthria and dysphagia goals. Pt able to complete dysphagia HEP, including lingual sweep, without pain with modified independence. Spouse reports occasional coughing which is inconsistent. Provided cough journal. Reinforced safe swallowing strategies. Spouse reports less pt frustration with being cued for effort level than "I can't hear you." Continues to require cuing at home. SLP explained that patient likely will need some level of cuing for effort/loudness on ongoing basis, but will continue ST to promote generalization. Focused on carryover of effort level in spontaneous responses; pt maintained appropriate loudness 60% of the time, required cues for effort (incuding verbal and visual/written reminder) remainder of the time, overall average was 70 dB at distance of 50 cm.   PATIENT EDUCATION: Education details: intelligibility strategies, caution with mixed consistencies Person educated: Patient and Spouse Education method: Explanation Education comprehension: verbalized understanding   ASSESSMENT:  CLINICAL IMPRESSION: Patient continues with moderate to severe motor speech impairment that is c/b reduced vocal intensity, imprecise articulation,  dysprosody and mild-moderate oropharyngeal dysphagia. Patient intelligibility improves significantly with use of dysarthria compensations, which he is able to use at 1-2 sentence level with mod cues in structured tasks. Pt continues to require cues for carryover in spontaneous responses. Most effective cues are for louder speech, pausing. Continue skilled ST to improve speech intelligibility and swallowing function.   OBJECTIVE  IMPAIRMENTS include dysarthria and dysphagia. These impairments are limiting patient from effectively communicating at home and in community and safety when swallowing. Factors affecting potential to achieve goals and functional outcome are ability to learn/carryover information, co-morbidities, and previous level of function. Patient will benefit from skilled SLP services to address above impairments and improve overall function.  REHAB POTENTIAL: Fair to good   GOALS: Goals reviewed with patient? Yes  SHORT TERM GOALS: Target date: 10 sessions  Patient will participate in objective swallowing evaluation (MBSS) to identify safest diet recommendation as well as therapeutic targets. Baseline: Goal status: MET  2.  Pt will participate in evaluation of motor speech with intelligibility goals to be added PRN. Baseline:  Goal status: MET  3.  Pt will complete basic HEP for oropharyngeal dysphagia with rare min cues. Baseline:  Goal status: INITIAL  3. With moderate assistance, pt will utilize speech intelligibility strategies (increased vocal intensity and over-articulation) to achieve > 95% speech intelligibility at the simple phrase level across 3 sessions.   Baseline: < 50% speech intelligibility at the simple sentence  Goal status: INITIAL    LONG TERM GOALS: Target date: 07/14/2022 Patient will consume recommended diet using strategies and compensations without overt s/sx aspiration >95% of the time. Baseline:  Goal status: INITIAL  Patient will complete advanced HEP for oropharygneal dysphagia with modified independence. Baseline:  Goal status: INITIAL  3. With minimal assistance, pt will utilize speech intelligibility strategies to achieve ~ 75% intelligibility with common functional phrases.  Baseline: < 50% intelligibility Goal status: INITIAL     PLAN:   SLP FREQUENCY:  1-2 x per week  SLP DURATION: 12 weeks  PLANNED INTERVENTIONS: Aspiration precaution  training, Pharyngeal strengthening exercises, Diet toleration management , Environmental controls, Trials of upgraded texture/liquids, Functional tasks, SLP instruction and feedback, Compensatory strategies, and Patient/family education   Deneise Lever, MS, Actor [phone]

## 2022-07-06 ENCOUNTER — Ambulatory Visit: Payer: Medicare Other | Admitting: Speech Pathology

## 2022-07-06 DIAGNOSIS — R471 Dysarthria and anarthria: Secondary | ICD-10-CM | POA: Diagnosis not present

## 2022-07-06 DIAGNOSIS — R1312 Dysphagia, oropharyngeal phase: Secondary | ICD-10-CM

## 2022-07-06 NOTE — Therapy (Signed)
OUTPATIENT SPEECH LANGUAGE PATHOLOGY TREATMENT   Patient Name: Mike Holloway MRN: [id number] DOB:24-Nov-1940, 81 y.o., male Today's Date: 07/06/2022   REFERRING PROVIDER: Bonney Roussel, PA   End of Session - 07/06/22 1308     Visit Number 9    Number of Visits 17    Date for SLP Re-Evaluation 07/14/22    Progress Note Due on Visit 10    SLP Start Time 1300    SLP Stop Time  1400    SLP Time Calculation (min) 60 min    Activity Tolerance Patient tolerated treatment well             Past Medical History:  Diagnosis Date   Arthritis    Atrial flutter ([place])    Diabetes mellitus ([place])    Essential tremor    Essential tremor    deep brain stimulator    Hypercholesteremia    Hypertension    Incontinence    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   SIADH (syndrome of inappropriate ADH production) ([place])    Sleep apnea    Stroke [facility])    Past Surgical History:  Procedure Laterality Date   ABLATION     APPENDECTOMY     CATARACT EXTRACTION, BILATERAL     COLONOSCOPY WITH PROPOFOL N/A 03/17/2018   Procedure: COLONOSCOPY WITH PROPOFOL;  Surgeon: Jonathon Bellows, MD;  Location: [facility] 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   Procedure: FECAL TRANSPLANT;  Surgeon: Jonathon Bellows, MD;  Location: [facility] ENDOSCOPY;  Service: Gastroenterology;  Laterality: N/A;   HEMORRHOID SURGERY     HERNIA REPAIR     HIP FRACTURE SURGERY     INTRAMEDULLARY (IM) NAIL INTERTROCHANTERIC Right 09/16/2018   Procedure: INTRAMEDULLARY (IM) NAIL INTERTROCHANTRIC;  Surgeon: Hessie Knows, MD;  Location: [facility] ORS;  Service: Orthopedics;  Laterality: Right;   IR CATHETER TUBE CHANGE  11/22/2018   NASAL SINUS SURGERY     ORCHIECTOMY     TONSILLECTOMY     Patient Active Problem List   Diagnosis Date Noted   DVT femoral (deep venous thrombosis) with thrombophlebitis, left ([place]) 02/06/2020   Lymphedema  04/15/2019   Bilateral leg edema 01/25/2019   Hyponatremia 12/24/2018   SIADH (syndrome of inappropriate ADH production) ([place]) 12/24/2018   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) 10/27/2018   Generalized weakness 10/14/2018   Hip fracture ([place]) 09/15/2018   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   Recurrent Clostridium difficile diarrhea 03/24/2018   HLD (hyperlipidemia) 03/24/2018   Contusion of right knee 02/14/2018   Bradycardia 12/28/2017   Status post right unicompartmental knee replacement 11/03/2017   Tremor 09/04/2016   Chronic pain of right knee 08/10/2016   Right ankle pain 08/10/2016   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   Non-Hodgkin's lymphoma ([place]) 12/11/2015   Urinary retention 09/08/2015   Lymphoma, non-Hodgkin's ([place]) 04/03/2015   Breathlessness on exertion 11/21/2014   Breath shortness 11/21/2014   Arthropathy 11/07/2014   Atrial flutter, paroxysmal ([place]) 11/07/2014   Type 2 diabetes mellitus ([place]) 11/07/2014   Benign essential tremor 11/07/2014   Benign essential HTN 11/07/2014   Mixed incontinence 11/07/2014   Hypercholesterolemia without hypertriglyceridemia 11/07/2014   Apnea, sleep 11/07/2014   Controlled type 2 diabetes mellitus without complication ([place]) 38/18/2993   Pure hypercholesterolemia 11/07/2014   Other abnormalities of gait and mobility 11/02/2011   Decreased mobility 11/02/2011  Abnormal gait 06/29/2011   Discoordination 06/29/2011    ONSET DATE: 04/12/22 (referral date). Dysphagia Onset originally in 2012    REFERRING DIAG: Essential tremor  THERAPY DIAG:  Dysarthria and anarthria  Oropharyngeal dysphagia  Rationale for Evaluation and Treatment Rehabilitation  SUBJECTIVE:   SUBJECTIVE STATEMENT:  Denies any difficulties swallowing this week Pt accompanied by: spouse Pat  PERTINENT HISTORY: Patient is an 81 year old male with past medical history including  essential tremor (since a young age), who underwent left thalamic DBS placement in 2009. 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 Hx also includes DM, atrial flutter s/p ablation, HTN, non-hodgkin lymphoma, CKD, and arthritis. He worked with SLP in 2019 through [place], as well as during subsequent stays at [facility], [place] and with home health. He has been consuming regular diet with thin liquids, and takes pills whole in pudding. Pt referred for SLP evaluation due to dysphagia worsening over the past year. Pt's spouse also reports worsening dysarthria/intelligibility.   DIAGNOSTIC FINDINGS: MBS 07/05/2018: "Patient presents with mild-moderate oropharyngeal dysphagia. He was assessed today with DBS off and then on. I was unable to detect a unequivocal difference between these conditions. With DBS off, he had PAS scores that ranged from 1-4 with thin liquids. With DBS on, he had PAS scores that ranged from 1-3. There was no aspiration observed today. No airway invasion noted with nectar-thick liquids. With DBS on, he was unable to swallow a BASO4 capsule on 2 trials. With this item, he had repetitive lingual movements and was unable to clear the capsule from his oral cavity."  MBS 05/04/22: Patient presents with mild-moderate oropharyngeal dysphagia consistent with prior objective testing. Oral stage is characterized by adequate oral containment and bolus preparation, however, slow and disorganized bolus transport, and repetitive lingual movements with barium tablet (unable to swallow with liquid or puree). Swallow initiation is timely at the level of the valleculae. Tongue base retraction is mildly reduced, leading to mild-moderate valleculae and pyriform residue after the swallow. Airway closure is complete, however trace larygneal penetration occurs when pt takes multiple swallows, as residue spills into the open  laryngeal vestibule between swallows. There is no aspiration. Pt reduces residue with cued dry swallow.   PAIN:  Are you having pain? No  PATIENT GOALS Swallow pills more easily; pt also states that she is concerned with pt's speech intelligibility  OBJECTIVE:   TODAY'S TREATMENT: Skilled ST session focused on dysarthria goals. SLP utilized portions of pt's HEP to draw attention to lower intensity voice (avg 68 dB). After feedback, pt increased intensity and intelligibility (slower rate), averaging 72 dB with personally relevant phrases. Progressed to sentence level reading (avg 73 dB, 95% intelligibility) and finally to conversation re: topic of interest (genealogy), with occasional min-mod cues necessary to increase volume during period of increased cognitive load (overall average 71 dB).    PATIENT EDUCATION: Education details: intelligibility strategies, caution with mixed consistencies Person educated: Patient and Spouse Education method: Explanation Education comprehension: verbalized understanding   ASSESSMENT:  CLINICAL IMPRESSION: Patient continues with moderate to severe motor speech impairment that is c/b reduced vocal intensity, imprecise articulation, dysprosody and mild-moderate oropharyngeal dysphagia. Patient intelligibility improves significantly with use of dysarthria compensations, which he is able to use at conversation level with min-mod cues. Most effective cues are for louder speech, pausing. Anticipate pt may require some level of cuing, at least for initial awareness of decreased  volume/intelligibility. Continue skilled ST to improve speech intelligibility and swallowing function.   OBJECTIVE IMPAIRMENTS include dysarthria and dysphagia. These impairments are limiting patient from effectively communicating at home and in community and safety when swallowing. Factors affecting potential to achieve goals and functional outcome are ability to learn/carryover  information, co-morbidities, and previous level of function. Patient will benefit from skilled SLP services to address above impairments and improve overall function.  REHAB POTENTIAL: Fair to good   GOALS: Goals reviewed with patient? Yes  SHORT TERM GOALS: Target date: 10 sessions  Patient will participate in objective swallowing evaluation (MBSS) to identify safest diet recommendation as well as therapeutic targets. Baseline: Goal status: MET  2.  Pt will participate in evaluation of motor speech with intelligibility goals to be added PRN. Baseline:  Goal status: MET  3.  Pt will complete basic HEP for oropharyngeal dysphagia with rare min cues. Baseline:  Goal status: INITIAL  3. With moderate assistance, pt will utilize speech intelligibility strategies (increased vocal intensity and over-articulation) to achieve > 95% speech intelligibility at the simple phrase level across 3 sessions.   Baseline: < 50% speech intelligibility at the simple sentence  Goal status: INITIAL    LONG TERM GOALS: Target date: 07/14/2022 Patient will consume recommended diet using strategies and compensations without overt s/sx aspiration >95% of the time. Baseline:  Goal status: INITIAL  Patient will complete advanced HEP for oropharygneal dysphagia with modified independence. Baseline:  Goal status: INITIAL  3. With minimal assistance, pt will utilize speech intelligibility strategies to achieve ~ 75% intelligibility with common functional phrases.  Baseline: < 50% intelligibility Goal status: INITIAL     PLAN:   SLP FREQUENCY:  1-2 x per week  SLP DURATION: 12 weeks  PLANNED INTERVENTIONS: Aspiration precaution training, Pharyngeal strengthening exercises, Diet toleration management , Environmental controls, Trials of upgraded texture/liquids, Functional tasks, SLP instruction and feedback, Compensatory strategies, and Patient/family education   Deneise Lever, MS,  Actor [phone]

## 2022-07-08 ENCOUNTER — Encounter: Payer: Medicare Other | Admitting: Speech Pathology

## 2022-07-13 ENCOUNTER — Ambulatory Visit: Payer: Medicare Other | Admitting: Speech Pathology

## 2022-07-15 ENCOUNTER — Encounter: Payer: Medicare Other | Admitting: Speech Pathology

## 2022-07-19 ENCOUNTER — Encounter: Payer: Medicare Other | Admitting: Physician Assistant

## 2022-07-20 ENCOUNTER — Ambulatory Visit: Payer: Medicare Other | Admitting: Speech Pathology

## 2022-07-22 ENCOUNTER — Encounter: Payer: Medicare Other | Admitting: Speech Pathology

## 2022-07-26 ENCOUNTER — Ambulatory Visit (INDEPENDENT_AMBULATORY_CARE_PROVIDER_SITE_OTHER): Payer: Medicare Other | Admitting: Urology

## 2022-07-26 VITALS — BP 132/69 | HR 75 | Ht 72.0 in | Wt 223.0 lb

## 2022-07-26 DIAGNOSIS — R339 Retention of urine, unspecified: Secondary | ICD-10-CM

## 2022-07-26 DIAGNOSIS — R310 Gross hematuria: Secondary | ICD-10-CM | POA: Diagnosis not present

## 2022-07-26 MED ORDER — CIPROFLOXACIN HCL 500 MG PO TABS
500.0000 mg | ORAL_TABLET | Freq: Once | ORAL | Status: AC
  Administered 2022-07-26: 500 mg via ORAL

## 2022-07-26 NOTE — Progress Notes (Unsigned)
Cath Change/ Replacement  Patient is present today for a catheter change due to urinary retention.  10 ml of water was removed from the balloon, a 16 FR coud foley cath was removed without difficulty.  Patient was cleaned and prepped in a sterile fashion with betadine and 2% lidocaine jelly was instilled into the urethra. A 16 FR coud foley cath was replaced into the bladder, no complications were noted. Urine return was noted 50 ml and urine was clear in color. The balloon was filled with 10ml of sterile water. A leg bag was attached for drainage.  A night bag was also given to the patient and patient was given instruction on how to change from one bag to another. Patient was given proper instruction on catheter care.    Performed by: Teressa Lower, CMA  Follow up: Follow-up visit 1 month for catheter change

## 2022-07-27 ENCOUNTER — Ambulatory Visit: Payer: Medicare Other | Admitting: Speech Pathology

## 2022-07-27 ENCOUNTER — Encounter: Payer: Self-pay | Admitting: Urology

## 2022-07-27 DIAGNOSIS — R471 Dysarthria and anarthria: Secondary | ICD-10-CM | POA: Diagnosis not present

## 2022-07-27 NOTE — Therapy (Signed)
OUTPATIENT SPEECH LANGUAGE PATHOLOGY TREATMENT AND DISCHARGE SUMMARY   Patient Name: Mike Holloway MRN: [id number] DOB:14-Dec-1940, 81 y.o., male Today's Date: 07/27/2022  SPEECH THERAPY DISCHARGE SUMMARY  Visits from Start of Care: 10  Current functional level related to goals / functional outcomes: Patient met 4/4 STGs and 2/3 LTGs.   Remaining deficits: Moderate motor speech impairment, mild-moderate oropharyngeal dysphagia   Education / Equipment: Strategies to manage dysphagia, cuing strategies for dysarthria.   Patient agrees to discharge. Patient goals were partially met. Patient is being discharged due to meeting the stated rehab goals.[name]    REFERRING PROVIDER: Bonney Roussel, PA   End of Session - 07/27/22 1310     Visit Number 10    Number of Visits 17    Date for SLP Re-Evaluation 07/27/22    Progress Note Due on Visit 10    SLP Start Time 1305    SLP Stop Time  1400    SLP Time Calculation (min) 55 min    Activity Tolerance Patient tolerated treatment well             Past Medical History:  Diagnosis Date   Arthritis    Atrial flutter ([place])    Diabetes mellitus ([place])    Essential tremor    Essential tremor    deep brain stimulator    Hypercholesteremia    Hypertension    Incontinence    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   SIADH (syndrome of inappropriate ADH production) ([place])    Sleep apnea    Stroke [facility])    Past Surgical History:  Procedure Laterality Date   ABLATION     APPENDECTOMY     CATARACT EXTRACTION, BILATERAL     COLONOSCOPY WITH PROPOFOL N/A 03/17/2018   Procedure: COLONOSCOPY WITH PROPOFOL;  Surgeon: Jonathon Bellows, MD;  Location: [facility] 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   Procedure: FECAL TRANSPLANT;  Surgeon: Jonathon Bellows, MD;  Location: [facility] ENDOSCOPY;  Service: Gastroenterology;  Laterality: N/A;   HEMORRHOID SURGERY      HERNIA REPAIR     HIP FRACTURE SURGERY     INTRAMEDULLARY (IM) NAIL INTERTROCHANTERIC Right 09/16/2018   Procedure: INTRAMEDULLARY (IM) NAIL INTERTROCHANTRIC;  Surgeon: Hessie Knows, MD;  Location: [facility] ORS;  Service: Orthopedics;  Laterality: Right;   IR CATHETER TUBE CHANGE  11/22/2018   NASAL SINUS SURGERY     ORCHIECTOMY     TONSILLECTOMY     Patient Active Problem List   Diagnosis Date Noted   DVT femoral (deep venous thrombosis) with thrombophlebitis, left ([place]) 02/06/2020   Lymphedema 04/15/2019   Bilateral leg edema 01/25/2019   Hyponatremia 12/24/2018   SIADH (syndrome of inappropriate ADH production) ([place]) 12/24/2018   Erosion of urethra due to catheterization of urinary tract ([place]) 10/27/2018   Generalized weakness 10/14/2018   Hip fracture ([place]) 09/15/2018   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   Recurrent Clostridium difficile diarrhea 03/24/2018   HLD (hyperlipidemia) 03/24/2018   Contusion of right knee 02/14/2018   Bradycardia 12/28/2017   Status post right unicompartmental knee replacement 11/03/2017   Tremor 09/04/2016   Chronic pain of right knee 08/10/2016   Right ankle pain 08/10/2016   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[place]) 12/11/2015   Urinary retention 09/08/2015   Lymphoma, non-Hodgkin's ([place]) 04/03/2015   Breathlessness on exertion 11/21/2014   Breath  shortness 11/21/2014   Arthropathy 11/07/2014   Atrial flutter, paroxysmal ([place]) 11/07/2014   Type 2 diabetes mellitus ([place]) 11/07/2014   Benign essential tremor 11/07/2014   Benign essential HTN 11/07/2014   Mixed incontinence 11/07/2014   Hypercholesterolemia without hypertriglyceridemia 11/07/2014   Apnea, sleep 11/07/2014   Controlled type 2 diabetes mellitus without complication ([place]) 37/62/8315   Pure hypercholesterolemia 11/07/2014   Other abnormalities of gait and mobility 11/02/2011   Decreased mobility 11/02/2011    Abnormal gait 06/29/2011   Discoordination 06/29/2011    ONSET DATE: 04/12/22 (referral date). Dysphagia Onset originally in 2012    REFERRING DIAG: Essential tremor  THERAPY DIAG:  Dysarthria and anarthria  Rationale for Evaluation and Treatment Rehabilitation  SUBJECTIVE:   SUBJECTIVE STATEMENT:  Denies any difficulties swallowing this week Pt accompanied by: spouse Pat  PERTINENT HISTORY: Patient is an 81 year old male with past medical history including essential tremor (since a young age), who underwent left thalamic DBS placement in 2009. 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 Hx also includes DM, atrial flutter s/p ablation, HTN, non-hodgkin lymphoma, CKD, and arthritis. He worked with SLP in 2019 through [place], as well as during subsequent stays at [facility], [place] and with home health. He has been consuming regular diet with thin liquids, and takes pills whole in pudding. Pt referred for SLP evaluation due to dysphagia worsening over the past year. Pt's spouse also reports worsening dysarthria/intelligibility.   DIAGNOSTIC FINDINGS: MBS 07/05/2018: "Patient presents with mild-moderate oropharyngeal dysphagia. He was assessed today with DBS off and then on. I was unable to detect a unequivocal difference between these conditions. With DBS off, he had PAS scores that ranged from 1-4 with thin liquids. With DBS on, he had PAS scores that ranged from 1-3. There was no aspiration observed today. No airway invasion noted with nectar-thick liquids. With DBS on, he was unable to swallow a BASO4 capsule on 2 trials. With this item, he had repetitive lingual movements and was unable to clear the capsule from his oral cavity."  MBS 05/04/22: Patient presents with mild-moderate oropharyngeal dysphagia consistent with prior objective testing. Oral stage is characterized by adequate oral  containment and bolus preparation, however, slow and disorganized bolus transport, and repetitive lingual movements with barium tablet (unable to swallow with liquid or puree). Swallow initiation is timely at the level of the valleculae. Tongue base retraction is mildly reduced, leading to mild-moderate valleculae and pyriform residue after the swallow. Airway closure is complete, however trace larygneal penetration occurs when pt takes multiple swallows, as residue spills into the open laryngeal vestibule between swallows. There is no aspiration. Pt reduces residue with cued dry swallow.   PAIN:  Are you having pain? No  PATIENT GOALS Swallow pills more easily; pt also states that she is concerned with pt's speech intelligibility  OBJECTIVE:   TODAY'S TREATMENT: Pt with lower average intensity initially today; utilized intensity-based tasks to increase loudness in structured tasks and conversation. Loud /a/ average 83 dB, with rare min cues to terminate prior to onset of gravelly quality. Patient read personally relevant phrases average 74 dB. Progressed to mod complex conversation; patient shared information re: genealogy average 72 dB with occasional min cues (verbal and gestural) to maintain appropriate loudness for >95% intelligibility.   PATIENT EDUCATION: Education details: intelligibility strategies, caution with mixed consistencies Person educated: Patient and Spouse Education method: Explanation Education comprehension: verbalized understanding  ASSESSMENT:  CLINICAL IMPRESSION: Patient continues with chronic motor speech impairment that is c/b reduced vocal intensity, imprecise articulation, dysprosody and mild-moderate oropharyngeal dysphagia. Patient intelligibility improves significantly with use of dysarthria compensations, which he is able to use at conversation level with min-mod cues. Most effective cues are for louder speech, pausing. Anticipate pt may require some level of  cuing, at least for initial awareness of decreased volume/intelligibility. Spouse demonstrates appropriate cuing for pt at this time. Pt and spouse are in agreement with d/c at this time.    OBJECTIVE IMPAIRMENTS include dysarthria and dysphagia. These impairments are limiting patient from effectively communicating at home and in community and safety when swallowing. Factors affecting potential to achieve goals and functional outcome are ability to learn/carryover information, co-morbidities, and previous level of function. Patient will benefit from skilled SLP services to address above impairments and improve overall function.  REHAB POTENTIAL: Fair to good   GOALS: Goals reviewed with patient? Yes  SHORT TERM GOALS: Target date: 10 sessions  Patient will participate in objective swallowing evaluation (MBSS) to identify safest diet recommendation as well as therapeutic targets. Baseline: Goal status: MET  2.  Pt will participate in evaluation of motor speech with intelligibility goals to be added PRN. Baseline:  Goal status: MET  3.  Pt will complete basic HEP for oropharyngeal dysphagia with rare min cues. Baseline:  Goal status: MET  4. With moderate assistance, pt will utilize speech intelligibility strategies (increased vocal intensity and over-articulation) to achieve > 95% speech intelligibility at the simple phrase level across 3 sessions.   Baseline: < 50% speech intelligibility at the simple sentence  Goal status: MET    LONG TERM GOALS: Target date: 07/14/2022 Patient will consume recommended diet using strategies and compensations without overt s/sx aspiration >95% of the time. Baseline:  Goal status: MET  Patient will complete advanced HEP for oropharygneal dysphagia with modified independence. Baseline:  Goal status: DEFERRED  3. With minimal assistance, pt will utilize speech intelligibility strategies to achieve ~ 75% intelligibility with common functional  phrases.  Baseline: < 50% intelligibility Goal status: MET    PLAN:   SLP FREQUENCY:  d/c  SLP DURATION: d/c  PLANNED INTERVENTIONS: Aspiration precaution training, Pharyngeal strengthening exercises, Diet toleration management , Environmental controls, Trials of upgraded texture/liquids, Functional tasks, SLP instruction and feedback, Compensatory strategies, and Patient/family education   Deneise Lever, MS, Actor [phone]

## 2022-07-27 NOTE — Progress Notes (Signed)
   07/27/22  CC:  Chief Complaint  Patient presents with   Cysto    HPI: See Aldona Bar Vaillancourt's note 06/18/2022  Blood pressure 132/69, pulse 75, height 6' (1.829 m), weight 223 lb (101.2 kg). NED. A&Ox3.   No respiratory distress   Abd soft, NT, ND Normal phallus with bilateral descended testicles  Cystoscopy Procedure Note  Patient identification was confirmed, informed consent was obtained, and patient was prepped using Betadine solution.  Lidocaine jelly was administered per urethral meatus.     Pre-Procedure: - Inspection reveals a normal caliber ureteral meatus.  Procedure: The flexible cystoscope was introduced without difficulty - No urethral strictures/lesions are present. -  Prominent lateral lobe enlargement with hypervascularity  prostate  - Mild elevation bladder neck - Bilateral ureteral orifices identified - Bladder mucosa  reveals inflammatory changes bladder base consistent with chronic indwelling Foley.  No suspicious solid or papillary lesions identified - No bladder stones - Moderate-severe trabeculation  Retroflexion shows no abnormalities   Post-Procedure: - Patient tolerated the procedure well  Assessment/ Plan: No suspicious lesions noted on cystoscopy Intermittent hematuria most likely secondary to chronic indwelling Foley    Abbie Sons, MD

## 2022-07-29 ENCOUNTER — Encounter: Payer: Medicare Other | Admitting: Speech Pathology

## 2022-08-03 ENCOUNTER — Encounter: Payer: Medicare Other | Admitting: Speech Pathology

## 2022-08-05 ENCOUNTER — Encounter: Payer: Medicare Other | Admitting: Speech Pathology

## 2022-08-10 ENCOUNTER — Encounter: Payer: Medicare Other | Admitting: Speech Pathology

## 2022-08-12 ENCOUNTER — Encounter: Payer: Medicare Other | Admitting: Speech Pathology

## 2022-08-17 ENCOUNTER — Encounter: Payer: Medicare Other | Admitting: Speech Pathology

## 2022-08-23 ENCOUNTER — Ambulatory Visit: Payer: Medicare Other | Admitting: Physician Assistant

## 2022-08-24 ENCOUNTER — Encounter: Payer: Medicare Other | Admitting: Speech Pathology

## 2022-08-25 ENCOUNTER — Ambulatory Visit (INDEPENDENT_AMBULATORY_CARE_PROVIDER_SITE_OTHER): Payer: Medicare Other | Admitting: Physician Assistant

## 2022-08-25 DIAGNOSIS — Z466 Encounter for fitting and adjustment of urinary device: Secondary | ICD-10-CM

## 2022-08-25 DIAGNOSIS — R339 Retention of urine, unspecified: Secondary | ICD-10-CM | POA: Diagnosis not present

## 2022-08-25 NOTE — Progress Notes (Signed)
Cath Change/ Replacement  Patient is present today for a catheter change due to urinary retention.  51m of water was removed from the balloon, a 16FR coude foley cath was removed without difficulty.  Patient was cleaned and prepped in a sterile fashion with betadine and 2% lidocaine jelly was instilled into the urethra. A 16 FR coude foley cath was replaced into the bladder, no complications were noted. Urine return was noted 257mand urine was yellow in color. The balloon was filled with 1043mf sterile water. A leg bag was attached for drainage.  Patient tolerated well.    Performed by: SamDebroah LoopA-C   Follow up: Return in about 4 weeks (around 09/22/2022) for Catheter exchange.

## 2022-08-26 ENCOUNTER — Encounter: Payer: Medicare Other | Admitting: Speech Pathology

## 2022-08-31 ENCOUNTER — Encounter: Payer: Medicare Other | Admitting: Speech Pathology

## 2022-09-02 ENCOUNTER — Encounter: Payer: Medicare Other | Admitting: Speech Pathology

## 2022-09-07 ENCOUNTER — Encounter: Payer: Medicare Other | Admitting: Speech Pathology

## 2022-09-08 ENCOUNTER — Encounter: Payer: Medicare Other | Admitting: Physician Assistant

## 2022-09-09 ENCOUNTER — Encounter: Payer: Medicare Other | Admitting: Speech Pathology

## 2022-09-14 ENCOUNTER — Encounter: Payer: Medicare Other | Admitting: Speech Pathology

## 2022-09-16 ENCOUNTER — Encounter: Payer: Medicare Other | Admitting: Speech Pathology

## 2022-09-21 ENCOUNTER — Encounter: Payer: Medicare Other | Admitting: Speech Pathology

## 2022-09-22 ENCOUNTER — Ambulatory Visit (INDEPENDENT_AMBULATORY_CARE_PROVIDER_SITE_OTHER): Payer: Medicare Other | Admitting: Physician Assistant

## 2022-09-22 DIAGNOSIS — R339 Retention of urine, unspecified: Secondary | ICD-10-CM | POA: Diagnosis not present

## 2022-09-22 NOTE — Progress Notes (Signed)
Cath Change/ Replacement  Patient is present today for a catheter change due to urinary retention.  52m of water was removed from the balloon, a 16FR coude foley cath was removed without difficulty.  Patient was cleaned and prepped in a sterile fashion with betadine and 2% lidocaine jelly was instilled into the urethra. A 16 FR coude foley cath was replaced into the bladder, no complications were noted. Urine return was noted 288mand urine was yellow in color. The balloon was filled with 1069mf sterile water. A leg bag was attached for drainage.  Patient tolerated well.    Performed by: SamDebroah LoopA-C   Follow up: Return in about 4 weeks (around 10/20/2022) for Catheter exchange.

## 2022-09-28 ENCOUNTER — Encounter: Payer: Medicare Other | Admitting: Speech Pathology

## 2022-10-05 ENCOUNTER — Encounter: Payer: Medicare Other | Admitting: Speech Pathology

## 2022-10-12 ENCOUNTER — Encounter: Payer: Medicare Other | Admitting: Speech Pathology

## 2022-10-19 ENCOUNTER — Encounter: Payer: Medicare Other | Admitting: Speech Pathology

## 2022-10-20 ENCOUNTER — Ambulatory Visit (INDEPENDENT_AMBULATORY_CARE_PROVIDER_SITE_OTHER): Payer: Medicare Other | Admitting: Urology

## 2022-10-20 ENCOUNTER — Ambulatory Visit: Payer: Medicare Other | Admitting: Physician Assistant

## 2022-10-20 DIAGNOSIS — Z978 Presence of other specified devices: Secondary | ICD-10-CM | POA: Diagnosis not present

## 2022-10-20 DIAGNOSIS — R339 Retention of urine, unspecified: Secondary | ICD-10-CM | POA: Diagnosis not present

## 2022-10-20 NOTE — Progress Notes (Signed)
Cath Change/ Replacement  Patient is present today for a catheter change due to urinary retention.  8 ml of water was removed from the balloon, a 16FR coude foley cath was removed without difficulty.  Patient was cleaned and prepped in a sterile fashion with betadine and 2% lidocaine jelly was instilled into the urethra. A 16 FR coude foley cath was replaced into the bladder, no complications were noted. The balloon was filled with 66m of sterile water. A leg bag was attached for drainage.  A night bag was also given to the patient.  Performed by: Kayln Garceau H RMA  Follow up: 1 month cath change on 11/18/22

## 2022-10-25 ENCOUNTER — Ambulatory Visit: Payer: Medicare Other | Admitting: Physician Assistant

## 2022-10-26 ENCOUNTER — Encounter: Payer: Medicare Other | Admitting: Speech Pathology

## 2022-11-02 ENCOUNTER — Encounter: Payer: Medicare Other | Admitting: Speech Pathology

## 2022-11-09 ENCOUNTER — Encounter: Payer: Medicare Other | Admitting: Speech Pathology

## 2022-11-16 ENCOUNTER — Encounter: Payer: Medicare Other | Admitting: Speech Pathology

## 2022-11-16 ENCOUNTER — Ambulatory Visit: Payer: Medicare Other | Attending: Physician Assistant

## 2022-11-16 DIAGNOSIS — R278 Other lack of coordination: Secondary | ICD-10-CM | POA: Insufficient documentation

## 2022-11-16 DIAGNOSIS — M6281 Muscle weakness (generalized): Secondary | ICD-10-CM | POA: Diagnosis not present

## 2022-11-16 DIAGNOSIS — G250 Essential tremor: Secondary | ICD-10-CM | POA: Diagnosis present

## 2022-11-16 NOTE — Therapy (Signed)
OUTPATIENT OCCUPATIONAL THERAPY NEURO EVALUATION  Patient Name: Mike Holloway MRN: [id number] DOB:17-May-1941, 82 y.o., male Today's Date: 11/20/2022  PCP: Dr. Thereasa Distance REFERRING PROVIDER: Gillermo Murdoch, PA  END OF SESSION:  OT End of Session - 11/20/22 1607     Visit Number 1    Number of Visits 24    Date for OT Re-Evaluation 02/08/23    Authorization Time Period Reporting period beginning 11/16/22    OT Start Time 1345    OT Stop Time 1450    OT Time Calculation (min) 65 min    Activity Tolerance Patient tolerated treatment well    Behavior During Therapy WFL for tasks assessed/performed             Past Medical History:  Diagnosis Date   Arthritis    Atrial flutter ([place])    Diabetes mellitus ([place])    Essential tremor    Essential tremor    deep brain stimulator    Hypercholesteremia    Hypertension    Incontinence    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[place])    grew on the testical   SIADH (syndrome of inappropriate ADH production) ([place])    Sleep apnea    Stroke [facility])    Past Surgical History:  Procedure Laterality Date   ABLATION     APPENDECTOMY     CATARACT EXTRACTION, BILATERAL     COLONOSCOPY WITH PROPOFOL N/A 03/17/2018   Procedure: COLONOSCOPY WITH PROPOFOL;  Surgeon: Jonathon Bellows, MD;  Location: [facility] 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   Procedure: FECAL TRANSPLANT;  Surgeon: Jonathon Bellows, MD;  Location: [facility] ENDOSCOPY;  Service: Gastroenterology;  Laterality: N/A;   HEMORRHOID SURGERY     HERNIA REPAIR     HIP FRACTURE SURGERY     INTRAMEDULLARY (IM) NAIL INTERTROCHANTERIC Right 09/16/2018   Procedure: INTRAMEDULLARY (IM) NAIL INTERTROCHANTRIC;  Surgeon: Hessie Knows, MD;  Location: [facility] ORS;  Service: Orthopedics;  Laterality: Right;   IR CATHETER TUBE CHANGE  11/22/2018   NASAL SINUS SURGERY     ORCHIECTOMY     TONSILLECTOMY     Patient Active Problem  List   Diagnosis Date Noted   DVT femoral (deep venous thrombosis) with thrombophlebitis, left ([place]) 02/06/2020   Lymphedema 04/15/2019   Bilateral leg edema 01/25/2019   Hyponatremia 12/24/2018   SIADH (syndrome of inappropriate ADH production) ([place]) 12/24/2018   Erosion of urethra due to catheterization of urinary tract ([place]) 10/27/2018   Generalized weakness 10/14/2018   Hip fracture ([place]) 09/15/2018   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   HLD (hyperlipidemia) 03/24/2018   Contusion of right knee 02/14/2018   Bradycardia 12/28/2017   Status post right unicompartmental knee replacement 11/03/2017   Tremor 09/04/2016   Chronic pain of right knee 08/10/2016   Right ankle pain 08/10/2016   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   Non-Hodgkin's lymphoma ([place]) 12/11/2015   Urinary retention 09/08/2015   Lymphoma, non-Hodgkin's ([place]) 04/03/2015   Breathlessness on exertion 11/21/2014   Breath shortness 11/21/2014   Arthropathy 11/07/2014   Atrial flutter, paroxysmal ([place]) 11/07/2014   Type 2 diabetes mellitus ([place]) 11/07/2014   Benign essential tremor 11/07/2014   Benign essential HTN 11/07/2014   Mixed incontinence 11/07/2014   Hypercholesterolemia without hypertriglyceridemia 11/07/2014   Apnea, sleep 11/07/2014   Controlled type 2 diabetes mellitus without complication ([place]) 0000000   Pure  hypercholesterolemia 11/07/2014   Other abnormalities of gait and mobility 11/02/2011   Decreased mobility 11/02/2011   Abnormal gait 06/29/2011   Discoordination 06/29/2011    ONSET DATE: DBS revision April 2019  REFERRING DIAG: G25.0 (ICD-10-CM) - Essential tremorZ96.89 (ICD-10-CM) - Presence of other specified functional implantsR41.89 (ICD-10-CM) - Other symptoms and signs involving cognitive functions and awareness  THERAPY DIAG: lack of coordination; essential tremor  Rationale for  Evaluation and Treatment: Rehabilitation  SUBJECTIVE:  SUBJECTIVE STATEMENT: Pt reports decreased coordination in [place], but L worse than the R. Pt accompanied by: significant other, (spouse, Pat)  PERTINENT HISTORY: Per chart: Initial History of Present Illness from 04/02/2015 from Dr. Maxine Glenn: Mr. Janke has had tremor since a young age, though experienced a marked worsening around 2002 after treatment for NHL. He was treated with numerous medications without adequate benefit. In 2009 he underwent left brain thalamic deep brain stimulation. He had marked benefit for about 18 months after his initial surgery. He required a lead revision at which time he had a lead thalamotomy for the right side. 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 He has residual numbness around the mouth and in the right hand.   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  Interval History Since Last Visit: Mike Holloway is a 82 y.o. right handed male who returns to the [facility] at [facility] for follow up of essential tremor which began at a young age.  He was last seen in clinic on 04/07/2022. He is here with his wife [name] who helps provide the history. He continues to have severe tremor on the left that continues to progress over time. HE also continues to have difficulty with his speech. Right hand tremor reasonably well controlled.   Tremor control has been limited by dysarthria which worsens with increase in stimulation to left body.  PRECAUTIONS: Fall  WEIGHT BEARING RESTRICTIONS: No  PAIN:  Are you having pain? Yes: NPRS scale: 1/10 Pain location: R knee Pain description: Sore Aggravating factors: walking a lot  Relieving factors: Deep  Blue Cream, rest  FALLS: Has patient fallen in last 6 months? No  LIVING ENVIRONMENT: Lives with: lives with their spouse Lives in: 2 level but pt resides on the first level Stairs: Yes: Internal: 3 steps; can reach both Has following equipment at home: Walker - 2 wheeled, wc, elevated toilet seat, grab bar at toilet, walk-in shower with shower stool, 3in1 commode for shower chair, multiple grab bars in the shower, bed rail   PLOF: Needs assistance with ADLs, Has caregiver services from [facility] 3 days a week, 4 hours each day.  They help with LB dressing, making breakfast, donning compression stockings, and household chores.  PATIENT GOALS: "Improve ability to use keyboard."   OBJECTIVE:   HAND DOMINANCE: Right  ADLs: Overall ADLs: caregiver assist for all ADLs Transfers/ambulation related to ADLs: supv/SBA with RW Eating: assist to cut food on occasion Grooming: set up, electric razor and toothbrush UB Dressing: some buttons are difficulty, extra time to manage clothing fasteners, including buttons and zippers LB Dressing: max A; can assist with hiking  Toileting: modified indep  Bathing: mod A Tub Shower transfers: SBA Equipment:  see above   IADLs: Shopping: dep Light  housekeeping: dep Meal Prep: dep Community mobility: dep Medication management: supv; spouse orders meds and pt sets them up in a pill organizer, spouse puts them in pudding Financial management: spouse manages Handwriting: 25% legible  MOBILITY STATUS: RW; transport chair for eval  ACTIVITY TOLERANCE: Activity tolerance: To be further assessed; [facility] for eval  FUNCTIONAL OUTCOME MEASURES: FOTO: 48; predicted 51  UPPER EXTREMITY ROM:  BUEs grossly WFL  UPPER EXTREMITY MMT:  BUEs grossly 5/5  HAND FUNCTION: Grip strength: Right: 55 lbs; Left: 50 lbs, Lateral pinch: Right: 17 lbs, Left: 17 lbs, and 3 point pinch: Right: 14 lbs, Left: 16 lbs  COORDINATION: Finger Nose Finger test: RUE mild ataxia,  LUE moderate 9 Hole Peg test: Right: 45 sec; Left: 58 sec SENSATION: Reports tingling in R thumb and IF after CVA in 2012  EDEMA: none  MUSCLE TONE: normal  COGNITION: Overall cognitive status: History of cognitive impairments - at baseline  VISION: Wears glasses  PRAXIS: Impaired: Motor planning  OBSERVATIONS: BUE tremor, L worse than R  TODAY'S TREATMENT:                                                                                                                              DATE:  11/16/22 Evaluation completed.  PATIENT EDUCATION: Education details: OT role, goals, poc Person educated: Patient and Spouse Education method: Explanation Education comprehension: verbalized understanding  HOME EXERCISE PROGRAM: To be initiated within the next 1-2 sessions.   GOALS: Goals reviewed with patient? Yes  SHORT TERM GOALS: Target date: 12/28/22 (6 weeks)  Pt will perform HEP for improving FMC/dexterity skills in BUEs with set up/supv. Baseline: Eval: not yet initiated Goal status: INITIAL  2.  Pt will identify 1-2 compensatory strategies to reduce UE tremors during daily tasks. Baseline: Eval: Educ not yet initiated Goal status: INITIAL  LONG TERM GOALS: Target date: 02/08/24 (12 weeks)  Pt will increase FOTO score to 51 or better to indicate clinically relevant improvement in self perceived performance of daily tasks. Baseline: Eval: 48 Goal status: INITIAL  2.  Pt will cut meat with set up/supv using adapted utensils as needed. Baseline: Eval: spouse cuts food Goal status: INITIAL  3.  Pt will improve handwriting legibility to 75% with use of adapted writing utensils as needed. Baseline: Eval: 25% legibility with standard pen Goal status: INITIAL  4.  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  Baseline: Eval: typing skills to be assessed with typing test next session Goal status:  INITIAL  ASSESSMENT:  CLINICAL IMPRESSION: Patient is an 82 y.o. male who was seen today for occupational therapy evaluation for functional decline related to worsening essential tremors.  Spouse reports that pt enjoys working on his computer doing genealogy, but has spent less time doing this d/t more difficulty with his typing.  Pt also endorses difficulty with writing, cutting food, and managing clothing  fasteners.  Pt has bilat DBS, though the LUE is less controlled as an increase in the R DBS causes worsening speech.  L DBS controls the RUE fairly well, per pt and spouse.  Pt will benefit from skilled OT for instruction in HEP for optimizing FMC/dexterity skills and ADL/AE training/compensatory training in order to maximize indep with ADLs and leisure activities to improve QOL.  PERFORMANCE DEFICITS: in functional skills including ADLs, coordination, dexterity, sensation, pain, Fine motor control, Gross motor control, body mechanics, decreased knowledge of use of DME, and UE functional use, and psychosocial skills including coping strategies and environmental adaptation.   IMPAIRMENTS: are limiting patient from ADLs, IADLs, and leisure.   CO-MORBIDITIES: has co-morbidities such as CVA  that affects occupational performance. Patient will benefit from skilled OT to address above impairments and improve overall function.  MODIFICATION OR ASSISTANCE TO COMPLETE EVALUATION: No modification of tasks or assist necessary to complete an evaluation.  OT OCCUPATIONAL PROFILE AND HISTORY: Problem focused assessment: Including review of records relating to presenting problem.  CLINICAL DECISION MAKING: Moderate - several treatment options, min-mod task modification necessary  REHAB POTENTIAL: Fair    EVALUATION COMPLEXITY: Moderate    PLAN:  OT FREQUENCY: 2x/week  OT DURATION: 12 weeks  PLANNED INTERVENTIONS: self care/ADL training, therapeutic exercise, therapeutic activity, neuromuscular  re-education, patient/family education, coping strategies training, and DME and/or AE instructions  RECOMMENDED OTHER SERVICES: none at this time  CONSULTED AND AGREED WITH PLAN OF CARE: Patient and family member/caregiver  PLAN FOR NEXT SESSION: see above  Leta Speller, MS, OTR/L  Darleene Cleaver, OT 11/20/2022, 4:38 PM

## 2022-11-18 ENCOUNTER — Ambulatory Visit (INDEPENDENT_AMBULATORY_CARE_PROVIDER_SITE_OTHER): Payer: Medicare Other | Admitting: Physician Assistant

## 2022-11-18 ENCOUNTER — Encounter: Payer: Self-pay | Admitting: Physician Assistant

## 2022-11-18 VITALS — BP 153/79 | HR 73

## 2022-11-18 DIAGNOSIS — Z466 Encounter for fitting and adjustment of urinary device: Secondary | ICD-10-CM

## 2022-11-18 DIAGNOSIS — R339 Retention of urine, unspecified: Secondary | ICD-10-CM

## 2022-11-18 NOTE — Progress Notes (Signed)
Cath Change/ Replacement  Patient is present today for a catheter change due to urinary retention.  21m of water was removed from the balloon, a 16FR coude foley cath was removed without difficulty.  Patient was cleaned and prepped in a sterile fashion with betadine and 2% lidocaine jelly was instilled into the urethra. A 16 FR coude foley cath was replaced into the bladder, no complications were noted. Urine return was noted 259mand urine was yellow in color. The balloon was filled with 108mf sterile water. A leg bag was attached for drainage.  Patient tolerated well.    Performed by: SamDebroah LoopA-C   Follow up: Return in about 4 weeks (around 12/16/2022) for Catheter exchange.

## 2022-11-23 ENCOUNTER — Encounter: Payer: Medicare Other | Admitting: Speech Pathology

## 2022-11-23 ENCOUNTER — Ambulatory Visit: Payer: Medicare Other

## 2022-11-23 DIAGNOSIS — R278 Other lack of coordination: Secondary | ICD-10-CM | POA: Diagnosis not present

## 2022-11-23 DIAGNOSIS — G250 Essential tremor: Secondary | ICD-10-CM

## 2022-11-23 NOTE — Therapy (Unsigned)
OUTPATIENT OCCUPATIONAL THERAPY NEURO TREATMENT  Patient Name: Mike Holloway MRN: [id number] DOB:1941/01/17, 82 y.o., male Today's Date: 11/23/2022  PCP: Dr. Thereasa Distance REFERRING PROVIDER: Gillermo Murdoch, PA  END OF SESSION:  OT End of Session - 11/23/22 1716     Visit Number 2    Number of Visits 24    Date for OT Re-Evaluation 02/08/23    Authorization Time Period Reporting period beginning 11/16/22    Progress Note Due on Visit 10    OT Start Time 1345    OT Stop Time 1430    OT Time Calculation (min) 45 min    Equipment Utilized During Treatment transport chair, RW    Activity Tolerance Patient tolerated treatment well    Behavior During Therapy WFL for tasks assessed/performed            Past Medical History:  Diagnosis Date   Arthritis    Atrial flutter ([place])    Diabetes mellitus ([place])    Essential tremor    Essential tremor    deep brain stimulator    Hypercholesteremia    Hypertension    Incontinence    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[place])    grew on the testical   SIADH (syndrome of inappropriate ADH production) ([place])    Sleep apnea    Stroke [facility])    Past Surgical History:  Procedure Laterality Date   ABLATION     APPENDECTOMY     CATARACT EXTRACTION, BILATERAL     COLONOSCOPY WITH PROPOFOL N/A 03/17/2018   Procedure: COLONOSCOPY WITH PROPOFOL;  Surgeon: Jonathon Bellows, MD;  Location: [facility] 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   Procedure: FECAL TRANSPLANT;  Surgeon: Jonathon Bellows, MD;  Location: [facility] ENDOSCOPY;  Service: Gastroenterology;  Laterality: N/A;   HEMORRHOID SURGERY     HERNIA REPAIR     HIP FRACTURE SURGERY     INTRAMEDULLARY (IM) NAIL INTERTROCHANTERIC Right 09/16/2018   Procedure: INTRAMEDULLARY (IM) NAIL INTERTROCHANTRIC;  Surgeon: Hessie Knows, MD;  Location: [facility] ORS;  Service: Orthopedics;  Laterality: Right;   IR CATHETER TUBE CHANGE  11/22/2018    NASAL SINUS SURGERY     ORCHIECTOMY     TONSILLECTOMY     Patient Active Problem List   Diagnosis Date Noted   DVT femoral (deep venous thrombosis) with thrombophlebitis, left ([place]) 02/06/2020   Lymphedema 04/15/2019   Bilateral leg edema 01/25/2019   Hyponatremia 12/24/2018   SIADH (syndrome of inappropriate ADH production) ([place]) 12/24/2018   Erosion of urethra due to catheterization of urinary tract ([place]) 10/27/2018   Generalized weakness 10/14/2018   Hip fracture ([place]) 09/15/2018   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   HLD (hyperlipidemia) 03/24/2018   Contusion of right knee 02/14/2018   Bradycardia 12/28/2017   Status post right unicompartmental knee replacement 11/03/2017   Tremor 09/04/2016   Chronic pain of right knee 08/10/2016   Right ankle pain 08/10/2016   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   Non-Hodgkin's lymphoma ([place]) 12/11/2015   Urinary retention 09/08/2015   Lymphoma, non-Hodgkin's ([place]) 04/03/2015   Breathlessness on exertion 11/21/2014   Breath shortness 11/21/2014   Arthropathy 11/07/2014   Atrial flutter, paroxysmal ([place]) 11/07/2014   Type 2 diabetes mellitus ([place]) 11/07/2014   Benign essential tremor 11/07/2014   Benign essential HTN 11/07/2014   Mixed incontinence 11/07/2014   Hypercholesterolemia without hypertriglyceridemia 11/07/2014  Apnea, sleep 11/07/2014   Controlled type 2 diabetes mellitus without complication ([place]) 0000000   Pure hypercholesterolemia 11/07/2014   Other abnormalities of gait and mobility 11/02/2011   Decreased mobility 11/02/2011   Abnormal gait 06/29/2011   Discoordination 06/29/2011   ONSET DATE: DBS revision April 2019  REFERRING DIAG: G25.0 (ICD-10-CM) - Essential tremorZ96.89 (ICD-10-CM) - Presence of other specified functional implantsR41.89 (ICD-10-CM) - Other symptoms and signs involving cognitive functions  and awareness  THERAPY DIAG: lack of coordination; essential tremor  Rationale for Evaluation and Treatment: Rehabilitation  SUBJECTIVE:  SUBJECTIVE STATEMENT: Pt reports that he sends a lot of emails at home and spouse states that pt is having more trouble with the typing and use of the mouse.  Pt accompanied by: significant other, (spouse, Pat)  PERTINENT HISTORY: Per chart: Initial History of Present Illness from 04/02/2015 from Dr. Maxine Glenn: Mr. Iwen has had tremor since a young age, though experienced a marked worsening around 2002 after treatment for NHL. He was treated with numerous medications without adequate benefit. In 2009 he underwent left brain thalamic deep brain stimulation. He had marked benefit for about 18 months after his initial surgery. He required a lead revision at which time he had a lead thalamotomy for the right side. 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 He has residual numbness around the mouth and in the right hand.   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  Interval History Since Last Visit: Mike Holloway is a 82 y.o. right handed male who returns to the [facility] at [facility] for follow up of essential tremor which began at a young age.  He was last seen in clinic on 04/07/2022. He is here with his wife [name] who helps provide the history. He continues to have severe tremor on the left that continues to progress over time. HE also continues to have difficulty with his speech. Right hand tremor reasonably well controlled.   Tremor control has been limited by dysarthria which worsens with increase in stimulation to left body.  PRECAUTIONS: Fall  WEIGHT BEARING RESTRICTIONS: No  PAIN:   Are you having pain? Yes: NPRS scale: 1/10 Pain location: R knee Pain description: Sore Aggravating factors: walking a lot  Relieving factors: Deep Blue Cream, rest  FALLS: Has patient fallen in last 6 months? No  LIVING ENVIRONMENT: Lives with: lives with their spouse Lives in: 2 level but pt resides on the first level Stairs: Yes: Internal: 3 steps; can reach both Has following equipment at home: Walker - 2 wheeled, wc, elevated toilet seat, grab bar at toilet, walk-in shower with shower stool, 3in1 commode for shower chair, multiple grab bars in the shower, bed rail   PLOF: Needs assistance with ADLs, Has caregiver services from [facility] 3 days a week, 4 hours each day.  They help with LB dressing, making breakfast, donning compression stockings, and household chores.  PATIENT GOALS: "Improve ability to use keyboard."   OBJECTIVE:   HAND DOMINANCE: Right  ADLs: Overall ADLs: caregiver assist for all ADLs Transfers/ambulation related to ADLs: supv/SBA with RW Eating: assist to cut food on occasion Grooming: set up, electric razor and toothbrush UB Dressing: some buttons are difficulty, extra time to manage clothing fasteners, including  buttons and zippers LB Dressing: max A; can assist with hiking  Toileting: modified indep  Bathing: mod A Tub Shower transfers: SBA Equipment:  see above   IADLs: Shopping: dep Light housekeeping: dep Meal Prep: dep Community mobility: dep Medication management: supv; spouse orders meds and pt sets them up in a pill organizer, spouse puts them in pudding Financial management: spouse manages Handwriting: 25% legible  MOBILITY STATUS: RW; transport chair for eval  ACTIVITY TOLERANCE: Activity tolerance: To be further assessed; [facility] for eval  FUNCTIONAL OUTCOME MEASURES: FOTO: 48; predicted 51  UPPER EXTREMITY ROM:  BUEs grossly WFL  UPPER EXTREMITY MMT:  BUEs grossly 5/5  HAND FUNCTION: Grip strength: Right: 55 lbs; Left: 50  lbs, Lateral pinch: Right: 17 lbs, Left: 17 lbs, and 3 point pinch: Right: 14 lbs, Left: 16 lbs  COORDINATION: Finger Nose Finger test: RUE mild ataxia, LUE moderate 9 Hole Peg test: Right: 45 sec; Left: 58 sec SENSATION: Reports tingling in R thumb and IF after CVA in 2012  EDEMA: none  MUSCLE TONE: normal  COGNITION: Overall cognitive status: History of cognitive impairments - at baseline  VISION: Wears glasses  PRAXIS: Impaired: Motor planning  OBSERVATIONS: BUE tremor, L worse than R  TODAY'S TREATMENT:                                                                                                                              DATE:  11/23/22 Self Care: 2 min typing test completed on laptop computer.  Pt uses a "point and peck" strategy at baseline.  Attempted with and without 1# bilat wrist weights, though pt removed weights part way through feeling like they were too cumbersome.  Pt averaged 4 wpm with 62% accuracy at best, though he terminated the test prematurely d/t discomfort with wrist weights.  Transitioned away from typing test d/t pt's frustration and practiced positional strategies while using mouse and typing a sentence formulated by OT.  Practiced WB into bilat forearms with elbows on table top, chair pushed closer to table to maximize support of arms on table.  Advised on placing pillow behind back at home in order to encourage the forward lean into elbows.  Advised on clearing desk at home to allow space to mimic this position for typing.  Practiced using L hand to stabilize the R wrist during use of mouse in the R hand.  Pt was more easily able to control mouse in this position, though pt felt it to be awkward.  Though, when not using L hand over R, pt's R forearm comes off the table and pt begins a posterior and L lateral lean away from the mouse d/t the effort it was taking while trying to control his tremor when using the mouse.  Encouraged pt practice these positional  strategies at home and encouraged pt that new positions can tend to feel awkward, but with practice they can become habit and feel more natural.  Spouse was in agreement that new positional strategies demonstrated today showed improvement in pt's typing accuracy and efficiency with mouse.   PATIENT EDUCATION: Education details: positional strategies at desktop for computer use Person educated: Patient and Spouse Education method: Explanation, demo Education comprehension: verbalized understanding, demonstrated understanding, further training needed  HOME EXERCISE PROGRAM: To be initiated within the next 1-2 sessions.   GOALS: Goals reviewed with patient? Yes  SHORT TERM GOALS: Target date: 12/28/22 (6 weeks)  Pt will perform HEP for improving FMC/dexterity skills in BUEs with set up/supv. Baseline: Eval: not yet initiated Goal status: INITIAL  2.  Pt will identify 1-2 compensatory strategies to reduce UE tremors during daily tasks. Baseline: Eval: Educ not yet initiated Goal status: INITIAL  LONG TERM GOALS: Target date: 02/08/24 (12 weeks)  Pt will increase FOTO score to 51 or better to indicate clinically relevant improvement in self perceived performance of daily tasks. Baseline: Eval: 48 Goal status: INITIAL  2.  Pt will cut meat with set up/supv using adapted utensils as needed. Baseline: Eval: spouse cuts food Goal status: INITIAL  3.  Pt will improve handwriting legibility to 75% with use of adapted writing utensils as needed. Baseline: Eval: 25% legibility with standard pen Goal status: INITIAL  4.  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  Baseline: Eval: typing skills to be assessed with typing test next session Goal status: INITIAL  ASSESSMENT:  CLINICAL IMPRESSION: Focus today on positional strategies while typing and mouse use.  Improved accuracy and efficiency with typing when cued to  rest bilat forearms on the table, including elbows.  Practiced using L hand over R wrist to stabilize R hand while using mouse, also noting improved efficiency for mouse use.  Tried 1# wrist weights while typing to minimize tremors, but pt felt these were too cumbersome.  Encouraged pt practice these positional strategies noted above at home and encouraged pt that new positions can tend to feel awkward, but with practice they can become habit and feel more natural.  Spouse was in agreement that new positional strategies demonstrated today showed improvement in pt's typing accuracy and efficiency with mouse.  Pt will benefit from continued skilled OT for instruction in HEP for optimizing FMC/dexterity skills and ADL/AE training/compensatory training in order to maximize indep with ADLs and leisure activities to improve QOL.  PERFORMANCE DEFICITS: in functional skills including ADLs, coordination, dexterity, sensation, pain, Fine motor control, Gross motor control, body mechanics, decreased knowledge of use of DME, and UE functional use, and psychosocial skills including coping strategies and environmental adaptation.   IMPAIRMENTS: are limiting patient from ADLs, IADLs, and leisure.   CO-MORBIDITIES: has co-morbidities such as CVA  that affects occupational performance. Patient will benefit from skilled OT to address above impairments and improve overall function.  MODIFICATION OR ASSISTANCE TO COMPLETE EVALUATION: No modification of tasks or assist necessary to complete an evaluation.  OT OCCUPATIONAL PROFILE AND HISTORY: Problem focused assessment: Including review of records relating to presenting problem.  CLINICAL DECISION MAKING: Moderate - several treatment options, min-mod task modification necessary  REHAB POTENTIAL: Fair    EVALUATION COMPLEXITY: Moderate    PLAN:  OT FREQUENCY: 2x/week  OT DURATION: 12 weeks  PLANNED INTERVENTIONS: self care/ADL training, therapeutic exercise,  therapeutic activity, neuromuscular re-education, patient/family education, coping strategies training, and DME and/or AE instructions  RECOMMENDED OTHER SERVICES: none at this time  CONSULTED AND AGREED WITH PLAN OF  CARE: Patient and family member/caregiver  PLAN FOR NEXT SESSION: see above  Leta Speller, MS, OTR/L  Darleene Cleaver, OT 11/23/2022, 5:17 PM

## 2022-11-25 ENCOUNTER — Ambulatory Visit: Payer: Medicare Other

## 2022-11-30 ENCOUNTER — Encounter: Payer: Medicare Other | Admitting: Speech Pathology

## 2022-11-30 ENCOUNTER — Ambulatory Visit: Payer: Medicare Other | Attending: Physician Assistant

## 2022-11-30 DIAGNOSIS — R278 Other lack of coordination: Secondary | ICD-10-CM | POA: Insufficient documentation

## 2022-11-30 DIAGNOSIS — G250 Essential tremor: Secondary | ICD-10-CM | POA: Insufficient documentation

## 2022-12-01 NOTE — Therapy (Signed)
OUTPATIENT OCCUPATIONAL THERAPY NEURO TREATMENT  Patient Name: Mike Holloway MRN: [id number] DOB:[birth year], 82 y.o., male Today's Date: 12/01/2022  PCP: Dr. Thereasa Holloway REFERRING PROVIDER: Gillermo Murdoch, PA  END OF SESSION:  OT End of Session - 12/01/22 1619     Visit Number 3    Number of Visits 24    Date for OT Re-Evaluation 02/08/23    Authorization Time Period Reporting period beginning 11/16/22    Progress Note Due on Visit 10    OT Start Time 1345    OT Stop Time 1430    OT Time Calculation (min) 45 min    Equipment Utilized During Treatment transport chair, RW    Activity Tolerance Patient tolerated treatment well    Behavior During Therapy WFL for tasks assessed/performed            Past Medical History:  Diagnosis Date   Arthritis    Atrial flutter ([place])    Diabetes mellitus ([place])    Essential tremor    Essential tremor    deep brain stimulator    Hypercholesteremia    Hypertension    Incontinence    Non-Hodgkin lymphoma ([place])    grew on the testical   SIADH (syndrome of inappropriate ADH production) ([place])    Sleep apnea    Stroke [facility])    Past Surgical History:  Procedure Laterality Date   ABLATION     APPENDECTOMY     CATARACT EXTRACTION, BILATERAL     COLONOSCOPY WITH PROPOFOL N/A 03/17/2018   Procedure: COLONOSCOPY WITH PROPOFOL;  Surgeon: Mike Bellows, MD;  Location: [facility] 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   Procedure: FECAL TRANSPLANT;  Surgeon: Mike Bellows, MD;  Location: [facility] ENDOSCOPY;  Service: Gastroenterology;  Laterality: N/A;   HEMORRHOID SURGERY     HERNIA REPAIR     HIP FRACTURE SURGERY     INTRAMEDULLARY (IM) NAIL INTERTROCHANTERIC Right 09/16/2018   Procedure: INTRAMEDULLARY (IM) NAIL INTERTROCHANTRIC;  Surgeon: Mike Knows, MD;  Location: [facility] ORS;  Service: Orthopedics;  Laterality: Right;   IR CATHETER TUBE CHANGE  11/22/2018    NASAL SINUS SURGERY     ORCHIECTOMY     TONSILLECTOMY     Patient Active Problem List   Diagnosis Date Noted   DVT femoral (deep venous thrombosis) with thrombophlebitis, left ([place]) 02/06/2020   Lymphedema 04/15/2019   Bilateral leg edema 01/25/2019   Hyponatremia 12/24/2018   SIADH (syndrome of inappropriate ADH production) ([place]) 12/24/2018   Erosion of urethra due to catheterization of urinary tract ([place]) 10/27/2018   Generalized weakness 10/14/2018   Hip fracture ([place]) 09/15/2018   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   HLD (hyperlipidemia) 03/24/2018   Contusion of right knee 02/14/2018   Bradycardia 12/28/2017   Status post right unicompartmental knee replacement 11/03/2017   Tremor 09/04/2016   Chronic pain of right knee 08/10/2016   Right ankle pain 08/10/2016   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   Non-Hodgkin's lymphoma ([place]) 12/11/2015   Urinary retention 09/08/2015   Lymphoma, non-Hodgkin's ([place]) 04/03/2015   Breathlessness on exertion 11/21/2014   Breath shortness 11/21/2014   Arthropathy 11/07/2014   Atrial flutter, paroxysmal ([place]) 11/07/2014   Type 2 diabetes mellitus ([place]) 11/07/2014   Benign essential tremor 11/07/2014   Benign essential HTN 11/07/2014   Mixed incontinence 11/07/2014   Hypercholesterolemia without hypertriglyceridemia 11/07/2014  Apnea, sleep 11/07/2014   Controlled type 2 diabetes mellitus without complication ([place]) 0000000   Pure hypercholesterolemia 11/07/2014   Other abnormalities of gait and mobility 11/02/2011   Decreased mobility 11/02/2011   Abnormal gait 06/29/2011   Discoordination 06/29/2011   ONSET DATE: DBS revision April 2019  REFERRING DIAG: G25.0 (ICD-10-CM) - Essential tremorZ96.89 (ICD-10-CM) - Presence of other specified functional implantsR41.89 (ICD-10-CM) - Other symptoms and signs involving cognitive functions  and awareness  THERAPY DIAG: lack of coordination; essential tremor  Rationale for Evaluation and Treatment: Rehabilitation  SUBJECTIVE:  SUBJECTIVE STATEMENT: Pt reports that he can see an improvement in his typing when he positions bilat forearms on the table and leans forward into them while typing. Pt accompanied by: significant other, (spouse, Mike Holloway)  PERTINENT HISTORY: Per chart: Initial History of Present Illness from 04/02/2015 from Dr. Maxine Holloway: Mr. Mike Holloway has had tremor since a young age, though experienced a marked worsening around 2002 after treatment for NHL. He was treated with numerous medications without adequate benefit. In 2009 he underwent left brain thalamic deep brain stimulation. He had marked benefit for about 18 months after his initial surgery. He required a lead revision at which time he had a lead thalamotomy for the right side. 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 He has residual numbness around the mouth and in the right hand.   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  Interval History Since Last Visit: Mike Holloway is a 82 y.o. right handed male who returns to the [facility] at [facility] for follow up of essential tremor which began at a young age.  He was last seen in clinic on 04/07/2022. He is here with his wife Mike Holloway who helps provide the history. He continues to have severe tremor on the left that continues to progress over time. HE also continues to have difficulty with his speech. Right hand tremor reasonably well controlled.   Tremor control has been limited by dysarthria which worsens with increase in stimulation to left body.  PRECAUTIONS: Fall  WEIGHT BEARING RESTRICTIONS:  No  PAIN:  Are you having pain? Yes: NPRS scale: 1/10 Pain location: R knee Pain description: Sore Aggravating factors: walking a lot  Relieving factors: Deep Blue Cream, rest  FALLS: Has patient fallen in last 6 months? No  LIVING ENVIRONMENT: Lives with: lives with their spouse Lives in: 2 level but pt resides on the first level Stairs: Yes: Internal: 3 steps; can reach both Has following equipment at home: Walker - 2 wheeled, wc, elevated toilet seat, grab bar at toilet, walk-in shower with shower stool, 3in1 commode for shower chair, multiple grab bars in the shower, bed rail   PLOF: Needs assistance with ADLs, Has caregiver services from [facility] 3 days a week, 4 hours each day.  They help with LB dressing, making breakfast, donning compression stockings, and household chores.  PATIENT GOALS: "Improve ability to use keyboard."   OBJECTIVE:   HAND DOMINANCE: Right  ADLs: Overall ADLs: caregiver assist for all ADLs Transfers/ambulation related to ADLs: supv/SBA with RW Eating: assist to cut food on occasion Grooming: set up, electric razor and toothbrush UB Dressing: some buttons are difficulty, extra time to manage clothing fasteners, including buttons and zippers  LB Dressing: max A; can assist with hiking  Toileting: modified indep  Bathing: mod A Tub Shower transfers: SBA Equipment:  see above   IADLs: Shopping: dep Light housekeeping: dep Meal Prep: dep Community mobility: dep Medication management: supv; spouse orders meds and pt sets them up in a pill organizer, spouse puts them in pudding Financial management: spouse manages Handwriting: 25% legible  MOBILITY STATUS: RW; transport chair for eval  ACTIVITY TOLERANCE: Activity tolerance: To be further assessed; [facility] for eval  FUNCTIONAL OUTCOME MEASURES: FOTO: 48; predicted 51  UPPER EXTREMITY ROM:  BUEs grossly WFL  UPPER EXTREMITY MMT:  BUEs grossly 5/5  HAND FUNCTION: Grip strength: Right: 55  lbs; Left: 50 lbs, Lateral pinch: Right: 17 lbs, Left: 17 lbs, and 3 point pinch: Right: 14 lbs, Left: 16 lbs  COORDINATION: Finger Nose Finger test: RUE mild ataxia, LUE moderate 9 Hole Peg test: Right: 45 sec; Left: 58 sec SENSATION: Reports tingling in R thumb and IF after CVA in 2012  EDEMA: none  MUSCLE TONE: normal  COGNITION: Overall cognitive status: History of cognitive impairments - at baseline  VISION: Wears glasses  PRAXIS: Impaired: Motor planning  OBSERVATIONS: BUE tremor, L worse than R  TODAY'S TREATMENT:                                                                                                                              DATE:  11/23/22 Self Care: Pt brought his own keyboard, mouse pad, and mouse from home and practiced typing using strategies learned last session with Wbing into bilat forearms while typing.  Alternated between supported arms and no support and pt was able to notice improvement with typing while Wbing into forearms.  Practiced typing with OT providing deep pressure proximally through the tops of shoulders for increasing distal control.  Pt still found most benefit through [place] into forearms on table top.  Practiced using L hand to stabilize the R wrist during use of mouse in the R hand, but pt continues to feel this to be too awkward and prefers to rely on WB through the forearm.  Position pillow behind pt to promote additional forward lean.  Pt reports he forgot to try this at home but will plan to try.    Provided various pen types and practiced filling out practice checks.  Pt tried a wide grip pen, a PenAgain, and a weighted pen and found most benefit from weighted pen.  OT provided cues for positioning both forearms on table up to the elbow to maximize distal stability while the L hand stabilized the paper and the R hand wrote.  Provided information on options to obtain weighted pen.  Spouse will have daughter order from online.   PATIENT  EDUCATION: Education details: positional strategies at desktop for computer use; adapted pens for writing Person educated: Patient and Spouse Education method: Explanation, demo Education comprehension: verbalized understanding, demonstrated understanding, further training needed  HOME EXERCISE  PROGRAM: To be initiated within the next 1-2 sessions.  GOALS: Goals reviewed with patient? Yes  SHORT TERM GOALS: Target date: 12/28/22 (6 weeks)  Pt will perform HEP for improving FMC/dexterity skills in BUEs with set up/supv. Baseline: Eval: not yet initiated Goal status: INITIAL  2.  Pt will identify 1-2 compensatory strategies to reduce UE tremors during daily tasks. Baseline: Eval: Educ not yet initiated Goal status: INITIAL  LONG TERM GOALS: Target date: 02/08/24 (12 weeks)  Pt will increase FOTO score to 51 or better to indicate clinically relevant improvement in self perceived performance of daily tasks. Baseline: Eval: 48 Goal status: INITIAL  2.  Pt will cut meat with set up/supv using adapted utensils as needed. Baseline: Eval: spouse cuts food Goal status: INITIAL  3.  Pt will improve handwriting legibility to 75% with use of adapted writing utensils as needed. Baseline: Eval: 25% legibility with standard pen Goal status: INITIAL  4.  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  Baseline: Eval: typing skills to be assessed with typing test next session Goal status: INITIAL  ASSESSMENT:  CLINICAL IMPRESSION: Continued focus on positional strategies for typing and mouse use.  Pt brought his own keyboard, mouse pad, and mouse from home and practiced typing with proximal deep pressure through the shoulders, practiced without arms supported on table top, and practiced Wbing through the forearms while typing; all 3 strategies tried for pt to assess any changes in his typing accuracy.  Pt found most benefit  from [place] through bilat forearms while typing, and he will continue to use this strategy at home.  Pt states that he was able to clear off his desk to allow this positioning at his computer at home.  Also focus on handwriting skills.  Pt tried a wide grip pen, a PenAgain, and a weighted pen and found most benefit from weighted pen.  OT provided cues for positioning both forearms on table up to the elbow to maximize distal stability while the L hand stabilized the paper and the R hand wrote.  Provided information on options to obtain weighted pen.  Spouse will have daughter order pen weights from online.  Pt will benefit from continued skilled OT for instruction in HEP for optimizing FMC/dexterity skills and ADL/AE training/compensatory training in order to maximize indep with ADLs and leisure activities to improve QOL.  PERFORMANCE DEFICITS: in functional skills including ADLs, coordination, dexterity, sensation, pain, Fine motor control, Gross motor control, body mechanics, decreased knowledge of use of DME, and UE functional use, and psychosocial skills including coping strategies and environmental adaptation.   IMPAIRMENTS: are limiting patient from ADLs, IADLs, and leisure.   CO-MORBIDITIES: has co-morbidities such as CVA  that affects occupational performance. Patient will benefit from skilled OT to address above impairments and improve overall function.  MODIFICATION OR ASSISTANCE TO COMPLETE EVALUATION: No modification of tasks or assist necessary to complete an evaluation.  OT OCCUPATIONAL PROFILE AND HISTORY: Problem focused assessment: Including review of records relating to presenting problem.  CLINICAL DECISION MAKING: Moderate - several treatment options, min-mod task modification necessary  REHAB POTENTIAL: Fair    EVALUATION COMPLEXITY: Moderate    PLAN:  OT FREQUENCY: 2x/week  OT DURATION: 12 weeks  PLANNED INTERVENTIONS: self care/ADL training, therapeutic exercise,  therapeutic activity, neuromuscular re-education, patient/family education, coping strategies training, and DME and/or AE instructions  RECOMMENDED OTHER SERVICES: none at this time  CONSULTED AND AGREED  WITH PLAN OF CARE: Patient and family member/caregiver  PLAN FOR NEXT SESSION: see above  Leta Speller, MS, OTR/L  Darleene Cleaver, OT 12/01/2022, 4:20 PM

## 2022-12-02 ENCOUNTER — Ambulatory Visit: Payer: Medicare Other

## 2022-12-02 DIAGNOSIS — R278 Other lack of coordination: Secondary | ICD-10-CM

## 2022-12-02 DIAGNOSIS — G250 Essential tremor: Secondary | ICD-10-CM

## 2022-12-05 NOTE — Therapy (Signed)
OUTPATIENT OCCUPATIONAL THERAPY NEURO TREATMENT  Patient Name: Mike Holloway MRN: [id number] DOB:1940-10-01, 82 y.o., male Today's Date: 12/05/2022  PCP: Dr. Thereasa Distance REFERRING PROVIDER: Gillermo Murdoch, PA  END OF SESSION:  OT End of Session - 12/05/22 2342     Visit Number 4    Number of Visits 24    Date for OT Re-Evaluation 02/08/23    Authorization Time Period Reporting period beginning 11/16/22    Progress Note Due on Visit 10    OT Start Time 1345    OT Stop Time 1430    OT Time Calculation (min) 45 min    Equipment Utilized During Treatment transport chair, RW    Activity Tolerance Patient tolerated treatment well    Behavior During Therapy WFL for tasks assessed/performed            Past Medical History:  Diagnosis Date   Arthritis    Atrial flutter ([place])    Diabetes mellitus ([place])    Essential tremor    Essential tremor    deep brain stimulator    Hypercholesteremia    Hypertension    Incontinence    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[place])    grew on the testical   SIADH (syndrome of inappropriate ADH production) ([place])    Sleep apnea    Stroke [facility])    Past Surgical History:  Procedure Laterality Date   ABLATION     APPENDECTOMY     CATARACT EXTRACTION, BILATERAL     COLONOSCOPY WITH PROPOFOL N/A 03/17/2018   Procedure: COLONOSCOPY WITH PROPOFOL;  Surgeon: Jonathon Bellows, MD;  Location: [facility] 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   Procedure: FECAL TRANSPLANT;  Surgeon: Jonathon Bellows, MD;  Location: [facility] ENDOSCOPY;  Service: Gastroenterology;  Laterality: N/A;   HEMORRHOID SURGERY     HERNIA REPAIR     HIP FRACTURE SURGERY     INTRAMEDULLARY (IM) NAIL INTERTROCHANTERIC Right 09/16/2018   Procedure: INTRAMEDULLARY (IM) NAIL INTERTROCHANTRIC;  Surgeon: Hessie Knows, MD;  Location: [facility] ORS;  Service: Orthopedics;  Laterality: Right;   IR CATHETER TUBE CHANGE  11/22/2018    NASAL SINUS SURGERY     ORCHIECTOMY     TONSILLECTOMY     Patient Active Problem List   Diagnosis Date Noted   DVT femoral (deep venous thrombosis) with thrombophlebitis, left ([place]) 02/06/2020   Lymphedema 04/15/2019   Bilateral leg edema 01/25/2019   Hyponatremia 12/24/2018   SIADH (syndrome of inappropriate ADH production) ([place]) 12/24/2018   Erosion of urethra due to catheterization of urinary tract ([place]) 10/27/2018   Generalized weakness 10/14/2018   Hip fracture ([place]) 09/15/2018   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   HLD (hyperlipidemia) 03/24/2018   Contusion of right knee 02/14/2018   Bradycardia 12/28/2017   Status post right unicompartmental knee replacement 11/03/2017   Tremor 09/04/2016   Chronic pain of right knee 08/10/2016   Right ankle pain 08/10/2016   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   Non-Hodgkin's lymphoma ([place]) 12/11/2015   Urinary retention 09/08/2015   Lymphoma, non-Hodgkin's ([place]) 04/03/2015   Breathlessness on exertion 11/21/2014   Breath shortness 11/21/2014   Arthropathy 11/07/2014   Atrial flutter, paroxysmal ([place]) 11/07/2014   Type 2 diabetes mellitus ([place]) 11/07/2014   Benign essential tremor 11/07/2014   Benign essential HTN 11/07/2014   Mixed incontinence 11/07/2014   Hypercholesterolemia without hypertriglyceridemia 11/07/2014  Apnea, sleep 11/07/2014   Controlled type 2 diabetes mellitus without complication ([place]) 0000000   Pure hypercholesterolemia 11/07/2014   Other abnormalities of gait and mobility 11/02/2011   Decreased mobility 11/02/2011   Abnormal gait 06/29/2011   Discoordination 06/29/2011   ONSET DATE: DBS revision April 2019  REFERRING DIAG: G25.0 (ICD-10-CM) - Essential tremorZ96.89 (ICD-10-CM) - Presence of other specified functional implantsR41.89 (ICD-10-CM) - Other symptoms and signs involving cognitive functions  and awareness  THERAPY DIAG: lack of coordination; essential tremor  Rationale for Evaluation and Treatment: Rehabilitation  SUBJECTIVE:  SUBJECTIVE STATEMENT: Pt reports doing well today.  He and spouse apologetic for arriving at the wrong tx time.  Pt was able to be accommodated with a different time slot. Pt accompanied by: significant other, (spouse, Mike Holloway)  PERTINENT HISTORY: Per chart: Initial History of Present Illness from 04/02/2015 from Dr. Maxine Glenn: Mike Holloway has had tremor since a young age, though experienced a marked worsening around 2002 after treatment for NHL. He was treated with numerous medications without adequate benefit. In 2009 he underwent left brain thalamic deep brain stimulation. He had marked benefit for about 18 months after his initial surgery. He required a lead revision at which time he had a lead thalamotomy for the right side. 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 He has residual numbness around the mouth and in the right hand.   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  Interval History Since Last Visit: Mike Holloway is a 82 y.o. right handed male who returns to the [facility] at [facility] for follow up of essential tremor which began at a young age.  He was last seen in clinic on 04/07/2022. He is here with his wife Mike Holloway who helps provide the history. He continues to have severe tremor on the left that continues to progress over time. HE also continues to have difficulty with his speech. Right hand tremor reasonably well controlled.   Tremor control has been limited by dysarthria which worsens with increase in stimulation to left body.  PRECAUTIONS: Fall  WEIGHT BEARING RESTRICTIONS:  No  PAIN:  Are you having pain? Yes: NPRS scale: 1/10 Pain location: R knee Pain description: Sore Aggravating factors: walking a lot  Relieving factors: Deep Blue Cream, rest  FALLS: Has patient fallen in last 6 months? No  LIVING ENVIRONMENT: Lives with: lives with their spouse Lives in: 2 level but pt resides on the first level Stairs: Yes: Internal: 3 steps; can reach both Has following equipment at home: Walker - 2 wheeled, wc, elevated toilet seat, grab bar at toilet, walk-in shower with shower stool, 3in1 commode for shower chair, multiple grab bars in the shower, bed rail   PLOF: Needs assistance with ADLs, Has caregiver services from [facility] 3 days a week, 4 hours each day.  They help with LB dressing, making breakfast, donning compression stockings, and household chores.  PATIENT GOALS: "Improve ability to use keyboard."   OBJECTIVE:   HAND DOMINANCE: Right  ADLs: Overall ADLs: caregiver assist for all ADLs Transfers/ambulation related to ADLs: supv/SBA with RW Eating: assist to cut food on occasion Grooming: set up, electric razor and toothbrush UB Dressing: some buttons are difficulty, extra time to manage clothing fasteners, including  buttons and zippers LB Dressing: max A; can assist with hiking  Toileting: modified indep  Bathing: mod A Tub Shower transfers: SBA Equipment:  see above   IADLs: Shopping: dep Light housekeeping: dep Meal Prep: dep Community mobility: dep Medication management: supv; spouse orders meds and pt sets them up in a pill organizer, spouse puts them in pudding Financial management: spouse manages Handwriting: 25% legible  MOBILITY STATUS: RW; transport chair for eval  ACTIVITY TOLERANCE: Activity tolerance: To be further assessed; [facility] for eval  FUNCTIONAL OUTCOME MEASURES: FOTO: 48; predicted 51  UPPER EXTREMITY ROM:  BUEs grossly WFL  UPPER EXTREMITY MMT:  BUEs grossly 5/5  HAND FUNCTION: Grip strength: Right: 55  lbs; Left: 50 lbs, Lateral pinch: Right: 17 lbs, Left: 17 lbs, and 3 point pinch: Right: 14 lbs, Left: 16 lbs  COORDINATION: Finger Nose Finger test: RUE mild ataxia, LUE moderate 9 Hole Peg test: Right: 45 sec; Left: 58 sec SENSATION: Reports tingling in R thumb and IF after CVA in 2012  EDEMA: none  MUSCLE TONE: normal  COGNITION: Overall cognitive status: History of cognitive impairments - at baseline  VISION: Wears glasses  PRAXIS: Impaired: Motor planning  OBSERVATIONS: BUE tremor, L worse than R  TODAY'S TREATMENT:                                                                                                                              DATE:  11/23/22 Self Care: Pt arrived with his new set of weights for pens.  Completed writing trials with 1 weight and then progressed to 2 weights on 1 pen.  Pt found most benefit from 2 weights per pen.  Min vc for positioning and WB through entire forearms on table top to provide more distal stability.  Practiced writing with increased letter spacing and letter height having pt write in only capital letters.  Letters tend to crowd with more repetition, but pt/spouse/OT all able to note significant improvement in writing when pt uses capital letters vs cursive writing with cues for increased spacing between letters and words.  Encouraged pt practice daily writing short sentences for himself or spouse.  PATIENT EDUCATION: Education details: writing strategies Person educated: Patient and Spouse Education method: Explanation, demo Education comprehension: verbalized understanding, demonstrated understanding, further training needed  HOME EXERCISE PROGRAM: Handwriting, computer use  GOALS: Goals reviewed with patient? Yes  SHORT TERM GOALS: Target date: 12/28/22 (6 weeks)  Pt will perform HEP for improving FMC/dexterity skills in BUEs with set up/supv. Baseline: Eval: not yet initiated Goal status: INITIAL  2.  Pt will identify 1-2  compensatory strategies to reduce UE tremors during daily tasks. Baseline: Eval: Educ not yet initiated Goal status: INITIAL  LONG TERM GOALS: Target date: 02/08/24 (12 weeks)  Pt will increase FOTO score to 51 or better to indicate clinically relevant improvement in self perceived performance of daily tasks. Baseline: Eval: 48 Goal status: INITIAL  2.  Pt will  cut meat with set up/supv using adapted utensils as needed. Baseline: Eval: spouse cuts food Goal status: INITIAL  3.  Pt will improve handwriting legibility to 75% with use of adapted writing utensils as needed. Baseline: Eval: 25% legibility with standard pen Goal status: INITIAL  4.  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  Baseline: Eval: typing skills to be assessed with typing test next session Goal status: INITIAL  ASSESSMENT:  CLINICAL IMPRESSION: Pt arrived with his new set of weights for pens.  Completed writing trials with 1 weight and then progressed to 2 weights on 1 pen.  Pt found most benefit from 2 weights per pen.  Min vc for positioning and WB through entire forearms on table top to provide more distal stability.  Practiced writing with increased letter spacing and letter height having pt write in only capital letters.  Letters tend to crowd with more repetition, but pt/spouse/OT all able to note significant improvement in writing when pt uses capital letters vs cursive writing with cues for increased spacing between letters and words.  Pt will benefit from continued skilled OT for instruction in HEP for optimizing FMC/dexterity skills and ADL/AE training/compensatory training in order to maximize indep with ADLs and leisure activities to improve QOL.  PERFORMANCE DEFICITS: in functional skills including ADLs, coordination, dexterity, sensation, pain, Fine motor control, Gross motor control, body mechanics, decreased knowledge of use of  DME, and UE functional use, and psychosocial skills including coping strategies and environmental adaptation.   IMPAIRMENTS: are limiting patient from ADLs, IADLs, and leisure.   CO-MORBIDITIES: has co-morbidities such as CVA  that affects occupational performance. Patient will benefit from skilled OT to address above impairments and improve overall function.  MODIFICATION OR ASSISTANCE TO COMPLETE EVALUATION: No modification of tasks or assist necessary to complete an evaluation.  OT OCCUPATIONAL PROFILE AND HISTORY: Problem focused assessment: Including review of records relating to presenting problem.  CLINICAL DECISION MAKING: Moderate - several treatment options, min-mod task modification necessary  REHAB POTENTIAL: Fair    EVALUATION COMPLEXITY: Moderate    PLAN:  OT FREQUENCY: 2x/week  OT DURATION: 12 weeks  PLANNED INTERVENTIONS: self care/ADL training, therapeutic exercise, therapeutic activity, neuromuscular re-education, patient/family education, coping strategies training, and DME and/or AE instructions  RECOMMENDED OTHER SERVICES: none at this time  CONSULTED AND AGREED WITH PLAN OF CARE: Patient and family member/caregiver  PLAN FOR NEXT SESSION: see above  Leta Speller, MS, OTR/L  Darleene Cleaver, OT 12/05/2022, 11:44 PM

## 2022-12-07 ENCOUNTER — Ambulatory Visit: Payer: Medicare Other

## 2022-12-07 ENCOUNTER — Encounter: Payer: Medicare Other | Admitting: Speech Pathology

## 2022-12-07 DIAGNOSIS — R278 Other lack of coordination: Secondary | ICD-10-CM | POA: Diagnosis not present

## 2022-12-07 DIAGNOSIS — G250 Essential tremor: Secondary | ICD-10-CM

## 2022-12-07 NOTE — Therapy (Signed)
OUTPATIENT OCCUPATIONAL THERAPY NEURO TREATMENT  Patient Name: Mike Holloway MRN: [id number] DOB:[birth year], 82 y.o., male Today's Date: 12/07/2022  PCP: Dr. Thereasa Distance REFERRING PROVIDER: Gillermo Murdoch, PA  END OF SESSION:  OT End of Session - 12/07/22 1449     Visit Number 5    Number of Visits 24    Date for OT Re-Evaluation 02/08/23    Authorization Time Period Reporting period beginning 11/16/22    Progress Note Due on Visit 10    OT Start Time 1345    OT Stop Time 1430    OT Time Calculation (min) 45 min    Equipment Utilized During Treatment transport chair, RW    Activity Tolerance Patient tolerated treatment well    Behavior During Therapy WFL for tasks assessed/performed            Past Medical History:  Diagnosis Date   Arthritis    Atrial flutter ([place])    Diabetes mellitus ([place])    Essential tremor    Essential tremor    deep brain stimulator    Hypercholesteremia    Hypertension    Incontinence    Non-Hodgkin lymphoma ([place])    grew on the testical   SIADH (syndrome of inappropriate ADH production) ([place])    Sleep apnea    Stroke [facility])    Past Surgical History:  Procedure Laterality Date   ABLATION     APPENDECTOMY     CATARACT EXTRACTION, BILATERAL     COLONOSCOPY WITH PROPOFOL N/A 03/17/2018   Procedure: COLONOSCOPY WITH PROPOFOL;  Surgeon: Jonathon Bellows, MD;  Location: [facility] 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   Procedure: FECAL TRANSPLANT;  Surgeon: Jonathon Bellows, MD;  Location: [facility] ENDOSCOPY;  Service: Gastroenterology;  Laterality: N/A;   HEMORRHOID SURGERY     HERNIA REPAIR     HIP FRACTURE SURGERY     INTRAMEDULLARY (IM) NAIL INTERTROCHANTERIC Right 09/16/2018   Procedure: INTRAMEDULLARY (IM) NAIL INTERTROCHANTRIC;  Surgeon: Hessie Knows, MD;  Location: [facility] ORS;  Service: Orthopedics;  Laterality: Right;   IR CATHETER TUBE CHANGE  11/22/2018    NASAL SINUS SURGERY     ORCHIECTOMY     TONSILLECTOMY     Patient Active Problem List   Diagnosis Date Noted   DVT femoral (deep venous thrombosis) with thrombophlebitis, left ([place]) 02/06/2020   Lymphedema 04/15/2019   Bilateral leg edema 01/25/2019   Hyponatremia 12/24/2018   SIADH (syndrome of inappropriate ADH production) ([place]) 12/24/2018   Erosion of urethra due to catheterization of urinary tract ([place]) 10/27/2018   Generalized weakness 10/14/2018   Hip fracture ([place]) 09/15/2018   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   HLD (hyperlipidemia) 03/24/2018   Contusion of right knee 02/14/2018   Bradycardia 12/28/2017   Status post right unicompartmental knee replacement 11/03/2017   Tremor 09/04/2016   Chronic pain of right knee 08/10/2016   Right ankle pain 08/10/2016   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   Non-Hodgkin's lymphoma ([place]) 12/11/2015   Urinary retention 09/08/2015   Lymphoma, non-Hodgkin's ([place]) 04/03/2015   Breathlessness on exertion 11/21/2014   Breath shortness 11/21/2014   Arthropathy 11/07/2014   Atrial flutter, paroxysmal ([place]) 11/07/2014   Type 2 diabetes mellitus ([place]) 11/07/2014   Benign essential tremor 11/07/2014   Benign essential HTN 11/07/2014   Mixed incontinence 11/07/2014   Hypercholesterolemia without hypertriglyceridemia 11/07/2014  Apnea, sleep 11/07/2014   Controlled type 2 diabetes mellitus without complication ([place]) 0000000   Pure hypercholesterolemia 11/07/2014   Other abnormalities of gait and mobility 11/02/2011   Decreased mobility 11/02/2011   Abnormal gait 06/29/2011   Discoordination 06/29/2011   ONSET DATE: DBS revision April 2019  REFERRING DIAG: G25.0 (ICD-10-CM) - Essential tremorZ96.89 (ICD-10-CM) - Presence of other specified functional implantsR41.89 (ICD-10-CM) - Other symptoms and signs involving cognitive functions  and awareness  THERAPY DIAG: lack of coordination; essential tremor  Rationale for Evaluation and Treatment: Rehabilitation  SUBJECTIVE:  SUBJECTIVE STATEMENT: Pt arrived with spouse.  They report they've had a busy week but doing well today. Pt accompanied by: significant other, (spouse, Mike Holloway)  PERTINENT HISTORY: Per chart: Initial History of Present Illness from 04/02/2015 from Dr. Maxine Glenn: Mike Holloway has had tremor since a young age, though experienced a marked worsening around 2002 after treatment for NHL. He was treated with numerous medications without adequate benefit. In 2009 he underwent left brain thalamic deep brain stimulation. He had marked benefit for about 18 months after his initial surgery. He required a lead revision at which time he had a lead thalamotomy for the right side. 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 He has residual numbness around the mouth and in the right hand.   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  Interval History Since Last Visit: Mike Holloway is a 82 y.o. right handed male who returns to the [facility] at [facility] for follow up of essential tremor which began at a young age.  He was last seen in clinic on 04/07/2022. He is here with his wife Mike Holloway who helps provide the history. He continues to have severe tremor on the left that continues to progress over time. HE also continues to have difficulty with his speech. Right hand tremor reasonably well controlled.   Tremor control has been limited by dysarthria which worsens with increase in stimulation to left body.  PRECAUTIONS: Fall  WEIGHT BEARING RESTRICTIONS: No  PAIN:  Are you having pain? Yes: NPRS scale: 1/10 Pain  location: R knee Pain description: Sore Aggravating factors: walking a lot  Relieving factors: Deep Blue Cream, rest  FALLS: Has patient fallen in last 6 months? No  LIVING ENVIRONMENT: Lives with: lives with their spouse Lives in: 2 level but pt resides on the first level Stairs: Yes: Internal: 3 steps; can reach both Has following equipment at home: Walker - 2 wheeled, wc, elevated toilet seat, grab bar at toilet, walk-in shower with shower stool, 3in1 commode for shower chair, multiple grab bars in the shower, bed rail   PLOF: Needs assistance with ADLs, Has caregiver services from [facility] 3 days a week, 4 hours each day.  They help with LB dressing, making breakfast, donning compression stockings, and household chores.  PATIENT GOALS: "Improve ability to use keyboard."   OBJECTIVE:   HAND DOMINANCE: Right  ADLs: Overall ADLs: caregiver assist for all ADLs Transfers/ambulation related to ADLs: supv/SBA with RW Eating: assist to cut food on occasion Grooming: set up, electric razor and toothbrush UB Dressing: some buttons are difficulty, extra time to manage clothing fasteners, including buttons and zippers LB Dressing: max A; can assist with hiking  Toileting:  modified indep  Bathing: mod A Tub Shower transfers: SBA Equipment:  see above   IADLs: Shopping: dep Light housekeeping: dep Meal Prep: dep Community mobility: dep Medication management: supv; spouse orders meds and pt sets them up in a pill organizer, spouse puts them in pudding Financial management: spouse manages Handwriting: 25% legible  MOBILITY STATUS: RW; transport chair for eval  ACTIVITY TOLERANCE: Activity tolerance: To be further assessed; [facility] for eval  FUNCTIONAL OUTCOME MEASURES: FOTO: 48; predicted 51  UPPER EXTREMITY ROM:  BUEs grossly WFL  UPPER EXTREMITY MMT:  BUEs grossly 5/5  HAND FUNCTION: Grip strength: Right: 55 lbs; Left: 50 lbs, Lateral pinch: Right: 17 lbs, Left: 17 lbs, and  3 point pinch: Right: 14 lbs, Left: 16 lbs  COORDINATION: Finger Nose Finger test: RUE mild ataxia, LUE moderate 9 Hole Peg test: Right: 45 sec; Left: 58 sec SENSATION: Reports tingling in R thumb and IF after CVA in 2012  EDEMA: none  MUSCLE TONE: normal  COGNITION: Overall cognitive status: History of cognitive impairments - at baseline  VISION: Wears glasses  PRAXIS: Impaired: Motor planning  OBSERVATIONS: BUE tremor, L worse than R  TODAY'S TREATMENT:                                                                                                                              Therapeutic Exercise: Issued medium, dark blue theraputty and instructed pt in strengthening and coordination exercises for R/L hands, including gross grasping, lateral/2 point/3 point pinching, digit abd/add, and digging coins out of putty.  Able to return demo with intermittent vc for technique to improve quality of movement, and min vc to follow handout issued.  Encouraged completion 10-20 min, 1-2x per day, 5-7 days a week as a general range for building strength and coordination in bilat hands.  Pt/spouse both verbalized understanding.  PATIENT EDUCATION: Education details: Theraputty exercises Person educated: Patient and Spouse Education method: Explanation, demo, handout issued Education comprehension: verbalized understanding, demonstrated understanding, further training needed  HOME EXERCISE PROGRAM: Handwriting, computer use, theraputty  GOALS: Goals reviewed with patient? Yes  SHORT TERM GOALS: Target date: 12/28/22 (6 weeks)  Pt will perform HEP for improving FMC/dexterity skills in BUEs with set up/supv. Baseline: Eval: not yet initiated Goal status: INITIAL  2.  Pt will identify 1-2 compensatory strategies to reduce UE tremors during daily tasks. Baseline: Eval: Educ not yet initiated Goal status: INITIAL  LONG TERM GOALS: Target date: 02/08/24 (12 weeks)  Pt will increase FOTO  score to 51 or better to indicate clinically relevant improvement in self perceived performance of daily tasks. Baseline: Eval: 48 Goal status: INITIAL  2.  Pt will cut meat with set up/supv using adapted utensils as needed. Baseline: Eval: spouse cuts food Goal status: INITIAL  3.  Pt will improve handwriting legibility to 75% with use of adapted writing utensils as needed. Baseline: Eval: 25% legibility with standard pen Goal status: INITIAL  4.  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  Baseline: Eval: typing skills to be assessed with typing test next session Goal status: INITIAL  ASSESSMENT:  CLINICAL IMPRESSION: Pt reports he hasn't practiced much writing this week as he and spouse have had a busy week.  Pt was issued and instructed in medium resistant, dark blue theraputty for increasing bilat hand strength and coordination skills for daily tasks.  Handout was issued and pt was able to return demo of all exercises with min vc to improve quality of movement and elicit correct movements.  Pt will benefit from continued skilled OT for instruction in HEP for optimizing FMC/dexterity skills and ADL/AE training/compensatory training in order to maximize indep with ADLs and leisure activities to improve QOL.  PERFORMANCE DEFICITS: in functional skills including ADLs, coordination, dexterity, sensation, pain, Fine motor control, Gross motor control, body mechanics, decreased knowledge of use of DME, and UE functional use, and psychosocial skills including coping strategies and environmental adaptation.   IMPAIRMENTS: are limiting patient from ADLs, IADLs, and leisure.   CO-MORBIDITIES: has co-morbidities such as CVA  that affects occupational performance. Patient will benefit from skilled OT to address above impairments and improve overall function.  MODIFICATION OR ASSISTANCE TO COMPLETE EVALUATION: No modification  of tasks or assist necessary to complete an evaluation.  OT OCCUPATIONAL PROFILE AND HISTORY: Problem focused assessment: Including review of records relating to presenting problem.  CLINICAL DECISION MAKING: Moderate - several treatment options, min-mod task modification necessary  REHAB POTENTIAL: Fair    EVALUATION COMPLEXITY: Moderate    PLAN:  OT FREQUENCY: 2x/week  OT DURATION: 12 weeks  PLANNED INTERVENTIONS: self care/ADL training, therapeutic exercise, therapeutic activity, neuromuscular re-education, patient/family education, coping strategies training, and DME and/or AE instructions  RECOMMENDED OTHER SERVICES: none at this time  CONSULTED AND AGREED WITH PLAN OF CARE: Patient and family member/caregiver  PLAN FOR NEXT SESSION: see above  Leta Speller, MS, OTR/L  Darleene Cleaver, OT 12/07/2022, 2:53 PM

## 2022-12-09 ENCOUNTER — Ambulatory Visit: Payer: Medicare Other

## 2022-12-09 DIAGNOSIS — R278 Other lack of coordination: Secondary | ICD-10-CM

## 2022-12-09 DIAGNOSIS — G250 Essential tremor: Secondary | ICD-10-CM

## 2022-12-09 NOTE — Therapy (Signed)
OUTPATIENT OCCUPATIONAL THERAPY NEURO TREATMENT  Patient Name: Mike Holloway MRN: [id number] DOB:[birth year], 82 y.o., male Today's Date: 12/09/2022  PCP: Dr. Thereasa Distance REFERRING PROVIDER: Gillermo Murdoch, PA  END OF SESSION:  OT End of Session - 12/09/22 1400     Visit Number 6    Number of Visits 24    Date for OT Re-Evaluation 02/08/23    Authorization Time Period Reporting period beginning 11/16/22    Progress Note Due on Visit 10    OT Start Time 1300    OT Stop Time 1345    OT Time Calculation (min) 45 min    Equipment Utilized During Treatment transport chair, RW    Activity Tolerance Patient tolerated treatment well    Behavior During Therapy WFL for tasks assessed/performed            Past Medical History:  Diagnosis Date   Arthritis    Atrial flutter ([place])    Diabetes mellitus ([place])    Essential tremor    Essential tremor    deep brain stimulator    Hypercholesteremia    Hypertension    Incontinence    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[place])    grew on the testical   SIADH (syndrome of inappropriate ADH production) ([place])    Sleep apnea    Stroke [facility])    Past Surgical History:  Procedure Laterality Date   ABLATION     APPENDECTOMY     CATARACT EXTRACTION, BILATERAL     COLONOSCOPY WITH PROPOFOL N/A 03/17/2018   Procedure: COLONOSCOPY WITH PROPOFOL;  Surgeon: Jonathon Bellows, MD;  Location: [facility] 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   Procedure: FECAL TRANSPLANT;  Surgeon: Jonathon Bellows, MD;  Location: [facility] ENDOSCOPY;  Service: Gastroenterology;  Laterality: N/A;   HEMORRHOID SURGERY     HERNIA REPAIR     HIP FRACTURE SURGERY     INTRAMEDULLARY (IM) NAIL INTERTROCHANTERIC Right 09/16/2018   Procedure: INTRAMEDULLARY (IM) NAIL INTERTROCHANTRIC;  Surgeon: Hessie Knows, MD;  Location: [facility] ORS;  Service: Orthopedics;  Laterality: Right;   IR CATHETER TUBE CHANGE  11/22/2018    NASAL SINUS SURGERY     ORCHIECTOMY     TONSILLECTOMY     Patient Active Problem List   Diagnosis Date Noted   DVT femoral (deep venous thrombosis) with thrombophlebitis, left ([place]) 02/06/2020   Lymphedema 04/15/2019   Bilateral leg edema 01/25/2019   Hyponatremia 12/24/2018   SIADH (syndrome of inappropriate ADH production) ([place]) 12/24/2018   Erosion of urethra due to catheterization of urinary tract ([place]) 10/27/2018   Generalized weakness 10/14/2018   Hip fracture ([place]) 09/15/2018   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   HLD (hyperlipidemia) 03/24/2018   Contusion of right knee 02/14/2018   Bradycardia 12/28/2017   Status post right unicompartmental knee replacement 11/03/2017   Tremor 09/04/2016   Chronic pain of right knee 08/10/2016   Right ankle pain 08/10/2016   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   Non-Hodgkin's lymphoma ([place]) 12/11/2015   Urinary retention 09/08/2015   Lymphoma, non-Hodgkin's ([place]) 04/03/2015   Breathlessness on exertion 11/21/2014   Breath shortness 11/21/2014   Arthropathy 11/07/2014   Atrial flutter, paroxysmal ([place]) 11/07/2014   Type 2 diabetes mellitus ([place]) 11/07/2014   Benign essential tremor 11/07/2014   Benign essential HTN 11/07/2014   Mixed incontinence 11/07/2014   Hypercholesterolemia without hypertriglyceridemia 11/07/2014  Apnea, sleep 11/07/2014   Controlled type 2 diabetes mellitus without complication ([place]) 0000000   Pure hypercholesterolemia 11/07/2014   Other abnormalities of gait and mobility 11/02/2011   Decreased mobility 11/02/2011   Abnormal gait 06/29/2011   Discoordination 06/29/2011   ONSET DATE: DBS revision April 2019  REFERRING DIAG: G25.0 (ICD-10-CM) - Essential tremorZ96.89 (ICD-10-CM) - Presence of other specified functional implantsR41.89 (ICD-10-CM) - Other symptoms and signs involving cognitive functions  and awareness  THERAPY DIAG: lack of coordination; essential tremor  Rationale for Evaluation and Treatment: Rehabilitation  SUBJECTIVE:  SUBJECTIVE STATEMENT: Pt brought his putty back for review of exercises.  Pt stated he didn't get a chance to use the putty yesterday. Pt accompanied by: significant other, (spouse, Mike Holloway)  PERTINENT HISTORY: Per chart: Initial History of Present Illness from 04/02/2015 from Dr. Maxine Glenn: [name] has had tremor since a young age, though experienced a marked worsening around 2002 after treatment for NHL. He was treated with numerous medications without adequate benefit. In 2009 he underwent left brain thalamic deep brain stimulation. He had marked benefit for about 18 months after his initial surgery. He required a lead revision at which time he had a lead thalamotomy for the right side. 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 He has residual numbness around the mouth and in the right hand.   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  Interval History Since Last Visit: Mike Holloway is a 82 y.o. right handed male who returns to the [facility] at [facility] for follow up of essential tremor which began at a young age.  He was last seen in clinic on 04/07/2022. He is here with his wife Mike Holloway who helps provide the history. He continues to have severe tremor on the left that continues to progress over time. HE also continues to have difficulty with his speech. Right hand tremor reasonably well controlled.   Tremor control has been limited by dysarthria which worsens with increase in stimulation to left body.  PRECAUTIONS: Fall  WEIGHT BEARING RESTRICTIONS: No  PAIN:  Are you having pain?  Yes: NPRS scale: 1/10 Pain location: R knee Pain description: Sore Aggravating factors: walking a lot  Relieving factors: Deep Blue Cream, rest  FALLS: Has patient fallen in last 6 months? No  LIVING ENVIRONMENT: Lives with: lives with their spouse Lives in: 2 level but pt resides on the first level Stairs: Yes: Internal: 3 steps; can reach both Has following equipment at home: Walker - 2 wheeled, wc, elevated toilet seat, grab bar at toilet, walk-in shower with shower stool, 3in1 commode for shower chair, multiple grab bars in the shower, bed rail   PLOF: Needs assistance with ADLs, Has caregiver services from [facility] 3 days a week, 4 hours each day.  They help with LB dressing, making breakfast, donning compression stockings, and household chores.  PATIENT GOALS: "Improve ability to use keyboard."   OBJECTIVE:   HAND DOMINANCE: Right  ADLs: Overall ADLs: caregiver assist for all ADLs Transfers/ambulation related to ADLs: supv/SBA with RW Eating: assist to cut food on occasion Grooming: set up, electric razor and toothbrush UB Dressing: some buttons are difficulty, extra time to manage clothing fasteners, including buttons and zippers LB Dressing: max A;  can assist with hiking  Toileting: modified indep  Bathing: mod A Tub Shower transfers: SBA Equipment:  see above   IADLs: Shopping: dep Light housekeeping: dep Meal Prep: dep Community mobility: dep Medication management: supv; spouse orders meds and pt sets them up in a pill organizer, spouse puts them in pudding Financial management: spouse manages Handwriting: 25% legible  MOBILITY STATUS: RW; transport chair for eval  ACTIVITY TOLERANCE: Activity tolerance: To be further assessed; [facility] for eval  FUNCTIONAL OUTCOME MEASURES: FOTO: 48; predicted 51  UPPER EXTREMITY ROM:  BUEs grossly WFL  UPPER EXTREMITY MMT:  BUEs grossly 5/5  HAND FUNCTION: Grip strength: Right: 55 lbs; Left: 50 lbs, Lateral pinch:  Right: 17 lbs, Left: 17 lbs, and 3 point pinch: Right: 14 lbs, Left: 16 lbs  COORDINATION: Finger Nose Finger test: RUE mild ataxia, LUE moderate 9 Hole Peg test: Right: 45 sec; Left: 58 sec SENSATION: Reports tingling in R thumb and IF after CVA in 2012  EDEMA: none  MUSCLE TONE: normal  COGNITION: Overall cognitive status: History of cognitive impairments - at baseline  VISION: Wears glasses  PRAXIS: Impaired: Motor planning  OBSERVATIONS: BUE tremor, L worse than R  TODAY'S TREATMENT:                                                                                                                              Therapeutic Exercise: Reviewed theraputty exercises for increasing strength and coordination in bilat hands, including gross grasping, lateral/2 point/3 point pinching, digit abd/add, and digging coins out of putty.  Pt required intermittent vc for technique to improve quality of movement and to follow handout accurately.    Self Care: Trial of various types of AE for grooming fingernails.  Pt practiced with nail clippers that were anchored onto a platform to allow pt to push clipper down with the palm of his hand and rest his opposite hand on the table while lining them up with the clipper.  OT provided intermittent pressure to increase WB through the forearm of the hand which was being trimmed in order to settle the tremor.  Also practiced modified positioning strategies, encouraging pt to scoot closer to table and bear weight through both forearms while resting hand flat while the other hand managed the modified clipper.  Pt required intermittent vc to maintain this positioning.  Practiced with wide grip holder for nail file, also with vc for WB through forearms on table top to control the file.  Made recommendation to try a piece of sand paper over dycem to avoid step of holding a file, but rather press finger into the file paper on the table top.  Pt receptive to all and  brainstormed ideas of making his own modified nail clipper with a large base as noted above.   PATIENT EDUCATION: Education details: AE for grooming Person educated: Patient and Spouse Education method: Explanation, demo, handout issued Education comprehension: verbalized understanding, demonstrated understanding, further training  needed  HOME EXERCISE PROGRAM: Handwriting, computer use, theraputty  GOALS: Goals reviewed with patient? Yes  SHORT TERM GOALS: Target date: 12/28/22 (6 weeks)  Pt will perform HEP for improving FMC/dexterity skills in BUEs with set up/supv. Baseline: Eval: not yet initiated Goal status: INITIAL  2.  Pt will identify 1-2 compensatory strategies to reduce UE tremors during daily tasks. Baseline: Eval: Educ not yet initiated Goal status: INITIAL  LONG TERM GOALS: Target date: 02/08/24 (12 weeks)  Pt will increase FOTO score to 51 or better to indicate clinically relevant improvement in self perceived performance of daily tasks. Baseline: Eval: 48 Goal status: INITIAL  2.  Pt will cut meat with set up/supv using adapted utensils as needed. Baseline: Eval: spouse cuts food Goal status: INITIAL  3.  Pt will improve handwriting legibility to 75% with use of adapted writing utensils as needed. Baseline: Eval: 25% legibility with standard pen Goal status: INITIAL  4.  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  Baseline: Eval: typing skills to be assessed with typing test next session Goal status: INITIAL  ASSESSMENT:  CLINICAL IMPRESSION: Putty exercises reviewed with pt, requiring intermittent min vc for technique and to accurately follow handout.  Pt was introduced to a variety of adapted nail grooming strategies, and found nail clippers on a wide base, as well as wide grip nail file holder effective for trimming and filing nails.  Pt receptive to all and brainstormed ideas of  making his own modified nail clipper with a large base as noted above.  OT provided intermittent pressure to increase WB through the forearm of the hand which was being trimmed in order to settle the tremor when clipping the L hand fingernails.  Pt did not require pressure through the R forearm when clipping the R hand fingernails.  Also practiced modified positioning strategies, encouraging pt to scoot closer to table and bear weight through both forearms while resting hand flat while the other hand managed the modified clipper.  Pt will benefit from continued skilled OT for instruction in HEP for optimizing FMC/dexterity skills and ADL/AE training/compensatory training in order to maximize indep with ADLs and leisure activities to improve QOL.  PERFORMANCE DEFICITS: in functional skills including ADLs, coordination, dexterity, sensation, pain, Fine motor control, Gross motor control, body mechanics, decreased knowledge of use of DME, and UE functional use, and psychosocial skills including coping strategies and environmental adaptation.   IMPAIRMENTS: are limiting patient from ADLs, IADLs, and leisure.   CO-MORBIDITIES: has co-morbidities such as CVA  that affects occupational performance. Patient will benefit from skilled OT to address above impairments and improve overall function.  MODIFICATION OR ASSISTANCE TO COMPLETE EVALUATION: No modification of tasks or assist necessary to complete an evaluation.  OT OCCUPATIONAL PROFILE AND HISTORY: Problem focused assessment: Including review of records relating to presenting problem.  CLINICAL DECISION MAKING: Moderate - several treatment options, min-mod task modification necessary  REHAB POTENTIAL: Fair    EVALUATION COMPLEXITY: Moderate    PLAN:  OT FREQUENCY: 2x/week  OT DURATION: 12 weeks  PLANNED INTERVENTIONS: self care/ADL training, therapeutic exercise, therapeutic activity, neuromuscular re-education, patient/family education, coping  strategies training, and DME and/or AE instructions  RECOMMENDED OTHER SERVICES: none at this time  CONSULTED AND AGREED WITH PLAN OF CARE: Patient and family member/caregiver  PLAN FOR NEXT SESSION: see above  Leta Speller, MS, OTR/L  Darleene Cleaver, OT 12/09/2022, 2:01 PM

## 2022-12-10 ENCOUNTER — Ambulatory Visit: Payer: Medicare Other | Admitting: Podiatry

## 2022-12-13 ENCOUNTER — Ambulatory Visit: Payer: Medicare Other | Admitting: Occupational Therapy

## 2022-12-14 ENCOUNTER — Encounter: Payer: Medicare Other | Admitting: Speech Pathology

## 2022-12-17 ENCOUNTER — Ambulatory Visit: Payer: Medicare Other | Admitting: Physician Assistant

## 2022-12-20 ENCOUNTER — Ambulatory Visit (INDEPENDENT_AMBULATORY_CARE_PROVIDER_SITE_OTHER): Payer: Medicare Other | Admitting: Physician Assistant

## 2022-12-20 DIAGNOSIS — R339 Retention of urine, unspecified: Secondary | ICD-10-CM | POA: Diagnosis not present

## 2022-12-20 DIAGNOSIS — N4889 Other specified disorders of penis: Secondary | ICD-10-CM

## 2022-12-20 DIAGNOSIS — N481 Balanitis: Secondary | ICD-10-CM

## 2022-12-20 DIAGNOSIS — Z466 Encounter for fitting and adjustment of urinary device: Secondary | ICD-10-CM | POA: Diagnosis not present

## 2022-12-20 MED ORDER — NYSTATIN-TRIAMCINOLONE 100000-0.1 UNIT/GM-% EX OINT: 1.0000 | TOPICAL_OINTMENT | Freq: Two times a day (BID) | CUTANEOUS | 2 refills | Status: DC

## 2022-12-20 NOTE — Progress Notes (Signed)
Cath Change/ Replacement  Patient is present today for a catheter change due to urinary retention.  47ml of water was removed from the balloon, a 16FR coude foley cath was removed without difficulty.  Patient was cleaned and prepped in a sterile fashion with betadine and 2% lidocaine jelly was instilled into the urethra. A 16 FR coude foley cath was replaced into the bladder, no complications were noted. Urine return was noted 57ml and urine was yellow in color. The balloon was filled with 27ml of sterile water. A leg bag was attached for drainage.  Patient tolerated well.    Performed by: Debroah Loop, PA-C   Additional notes: Small area of erythema on the proximal left lateral portion of the glans penis, suspect focal candidal vs moisture dermatitis. Prescribing Mycolog ointment for topical tx.  Follow up: Return in about 4 weeks (around 01/17/2023) for Catheter exchange.

## 2022-12-21 ENCOUNTER — Ambulatory Visit (INDEPENDENT_AMBULATORY_CARE_PROVIDER_SITE_OTHER): Payer: Medicare Other | Admitting: Podiatry

## 2022-12-21 ENCOUNTER — Encounter: Payer: Self-pay | Admitting: Podiatry

## 2022-12-21 ENCOUNTER — Encounter: Payer: Medicare Other | Admitting: Occupational Therapy

## 2022-12-21 ENCOUNTER — Encounter: Payer: Medicare Other | Admitting: Speech Pathology

## 2022-12-21 VITALS — BP 144/90 | HR 65

## 2022-12-21 DIAGNOSIS — M79675 Pain in left toe(s): Secondary | ICD-10-CM | POA: Diagnosis not present

## 2022-12-21 DIAGNOSIS — R2689 Other abnormalities of gait and mobility: Secondary | ICD-10-CM

## 2022-12-21 DIAGNOSIS — B351 Tinea unguium: Secondary | ICD-10-CM | POA: Diagnosis not present

## 2022-12-21 DIAGNOSIS — Z9181 History of falling: Secondary | ICD-10-CM

## 2022-12-21 DIAGNOSIS — M79674 Pain in right toe(s): Secondary | ICD-10-CM | POA: Diagnosis not present

## 2022-12-21 NOTE — Progress Notes (Signed)
  Subjective:  Patient ID: Mike Holloway, male    DOB: June 13, 1941,  MRN: [id number]  Chief Complaint  Patient presents with   Nail Problem    "We're inquiring about his big toe on his right foot.  All his toes have been extremely sensitive.  He got them cut about nine weeks ago.  They've been bothering him for about 15-20 years.  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 male returns for the above complaint.  Patient presents with thickened elongated dystrophic mycotic toenails x 10 mild pain on palpation worse with ambulation is with pressure he would like me to debride down is not able to do it himself.  He also has secondary complaint of multiple history of falling down due to extreme instability in the gait.  Patient states that the ankle and leg sometimes gives out.  He would like to discuss some balance bracing  Objective:   Vitals:   12/21/22 1551  BP: (!) 144/90  Pulse: 65   Podiatric Exam: Vascular: dorsalis pedis and posterior tibial pulses are palpable bilateral. Capillary return is immediate. Temperature gradient is WNL. Skin turgor WNL  Sensorium: Normal Semmes Weinstein monofilament test. Normal tactile sensation bilaterally. Nail Exam: Pt has thick disfigured discolored nails with subungual debris noted bilateral entire nail hallux through fifth toenails.  Pain on palpation to the nails. Ulcer Exam: There is no evidence of ulcer or pre-ulcerative changes or infection. Orthopedic Exam: Muscle tone and strength are WNL. No limitations in general ROM. No crepitus or effusions noted.  Antalgic gait noted to bilateral lower extremity with instability at the ankle level. Skin: No Porokeratosis. No infection or ulcers    Assessment & Plan:   1. History of fall   2. Pain due to onychomycosis of toenails of both feet   3. Antalgic gait     Patient was evaluated and treated and all questions answered.  Antalgic gait with history of fall -I explained to the patient the etiology of  antalgic gait various treatment options were discussed.  Given his multiple history of falling due to the ankle and leg giving out I believe patient will benefit from more than balance bracing and give him a prescription to obtain it from [place] [facility] he states understanding will do so  Onychomycosis with pain  -Nails palliatively debrided as below. -Educated on self-care  Procedure: Nail Debridement Rationale: pain  Type of Debridement: manual, sharp debridement. Instrumentation: Nail nipper, rotary burr. Number of Nails: 10  Procedures and Treatment: Consent by patient was obtained for treatment procedures. The patient understood the discussion of treatment and procedures well. All questions were answered thoroughly reviewed. Debridement of mycotic and hypertrophic toenails, 1 through 5 bilateral and clearing of subungual debris. No ulceration, no infection noted.  Return Visit-Office Procedure: Patient instructed to return to the office for a follow up visit 3 months for continued evaluation and treatment.  Boneta Lucks, DPM    No follow-ups on file.

## 2022-12-23 ENCOUNTER — Encounter: Payer: Medicare Other | Admitting: Occupational Therapy

## 2022-12-28 ENCOUNTER — Encounter: Payer: Medicare Other | Admitting: Speech Pathology

## 2022-12-28 ENCOUNTER — Ambulatory Visit: Payer: Medicare Other | Attending: Physician Assistant

## 2022-12-28 DIAGNOSIS — G250 Essential tremor: Secondary | ICD-10-CM | POA: Diagnosis present

## 2022-12-28 DIAGNOSIS — M6281 Muscle weakness (generalized): Secondary | ICD-10-CM | POA: Diagnosis present

## 2022-12-28 DIAGNOSIS — R278 Other lack of coordination: Secondary | ICD-10-CM | POA: Diagnosis present

## 2022-12-29 NOTE — Therapy (Signed)
OUTPATIENT OCCUPATIONAL THERAPY NEURO TREATMENT  Patient Name: Mike Holloway MRN: [id number] DOB:1941-06-09, 82 y.o., male Today's Date: 12/29/2022  PCP: Dr. Thereasa Distance REFERRING PROVIDER: Gillermo Murdoch, PA  END OF SESSION:  OT End of Session - 12/29/22 0830     Visit Number 7    Number of Visits 24    Date for OT Re-Evaluation 02/08/23    Authorization Time Period Reporting period beginning 11/16/22    Progress Note Due on Visit 10    OT Start Time 1300    OT Stop Time 1345    OT Time Calculation (min) 45 min    Equipment Utilized During Treatment transport chair, RW    Activity Tolerance Patient tolerated treatment well    Behavior During Therapy WFL for tasks assessed/performed            Past Medical History:  Diagnosis Date   Arthritis    Atrial flutter ([place])    Diabetes mellitus ([place])    Essential tremor    Essential tremor    deep brain stimulator    Hypercholesteremia    Hypertension    Incontinence    Non-Hodgkin lymphoma ([place])    grew on the testical   SIADH (syndrome of inappropriate ADH production) ([place])    Sleep apnea    Stroke [facility])    Past Surgical History:  Procedure Laterality Date   ABLATION     APPENDECTOMY     CATARACT EXTRACTION, BILATERAL     COLONOSCOPY WITH PROPOFOL N/A 03/17/2018   Procedure: COLONOSCOPY WITH PROPOFOL;  Surgeon: Jonathon Bellows, MD;  Location: [facility] 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   Procedure: FECAL TRANSPLANT;  Surgeon: Jonathon Bellows, MD;  Location: [facility] ENDOSCOPY;  Service: Gastroenterology;  Laterality: N/A;   HEMORRHOID SURGERY     HERNIA REPAIR     HIP FRACTURE SURGERY     INTRAMEDULLARY (IM) NAIL INTERTROCHANTERIC Right 09/16/2018   Procedure: INTRAMEDULLARY (IM) NAIL INTERTROCHANTRIC;  Surgeon: Hessie Knows, MD;  Location: [facility] ORS;  Service: Orthopedics;  Laterality: Right;   IR CATHETER TUBE CHANGE  11/22/2018    NASAL SINUS SURGERY     ORCHIECTOMY     TONSILLECTOMY     Patient Active Problem List   Diagnosis Date Noted   DVT femoral (deep venous thrombosis) with thrombophlebitis, left 02/06/2020   Lymphedema 04/15/2019   Bilateral leg edema 01/25/2019   Hyponatremia 12/24/2018   SIADH (syndrome of inappropriate ADH production) 12/24/2018   Erosion of urethra due to catheterization of urinary tract 10/27/2018   Generalized weakness 10/14/2018   Hip fracture 09/15/2018   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   HLD (hyperlipidemia) 03/24/2018   Contusion of right knee 02/14/2018   Bradycardia 12/28/2017   Status post right unicompartmental knee replacement 11/03/2017   Tremor 09/04/2016   Chronic pain of right knee 08/10/2016   Right ankle pain 08/10/2016   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   Non-Hodgkin's lymphoma 12/11/2015   Urinary retention 09/08/2015   Lymphoma, non-Hodgkin's 04/03/2015   Breathlessness on exertion 11/21/2014   Breath shortness 11/21/2014   Arthropathy 11/07/2014   Atrial flutter, paroxysmal ([place]) 11/07/2014   Type 2 diabetes mellitus ([place]) 11/07/2014   Benign essential tremor 11/07/2014   Benign essential HTN 11/07/2014   Mixed incontinence 11/07/2014   Hypercholesterolemia without hypertriglyceridemia 11/07/2014   Apnea, sleep 11/07/2014  Controlled type 2 diabetes mellitus without complication 0000000   Pure hypercholesterolemia 11/07/2014   Other abnormalities of gait and mobility 11/02/2011   Decreased mobility 11/02/2011   Abnormal gait 06/29/2011   Discoordination 06/29/2011   ONSET DATE: DBS revision April 2019  REFERRING DIAG: G25.0 (ICD-10-CM) - Essential tremorZ96.89 (ICD-10-CM) - Presence of other specified functional implantsR41.89 (ICD-10-CM) - Other symptoms and signs involving cognitive functions and awareness  THERAPY DIAG: lack of  coordination; essential tremor  Rationale for Evaluation and Treatment: Rehabilitation  SUBJECTIVE:  SUBJECTIVE STATEMENT: Pt returns today after being out for the last 2 weeks d/t the holiday and a respiratory illness.  Pt reports he's feeling much better. Pt accompanied by: significant other, (spouse, Pat)  PERTINENT HISTORY: Per chart: Initial History of Present Illness from 04/02/2015 from Dr. Maxine Glenn: Mike Holloway has had tremor since a young age, though experienced a marked worsening around 2002 after treatment for NHL. He was treated with numerous medications without adequate benefit. In 2009 he underwent left brain thalamic deep brain stimulation. He had marked benefit for about 18 months after his initial surgery. He required a lead revision at which time he had a lead thalamotomy for the right side. 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 He has residual numbness around the mouth and in the right hand.   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  Interval History Since Last Visit: OLUWATOBI Holloway is a 82 y.o. right handed male who returns to the [facility] Clinic at [facility] for follow up of essential tremor which began at a young age.  He was last seen in clinic on 04/07/2022. He is here with his wife [name] who helps provide the history. He continues to have severe tremor on the left that continues to progress over time. HE also continues to have difficulty with his speech. Right hand tremor reasonably well controlled.   Tremor control has been limited by dysarthria which worsens with increase in stimulation to left body.  PRECAUTIONS: Fall  WEIGHT BEARING RESTRICTIONS: No  PAIN:  Are you having pain? Yes: NPRS scale:  1/10 Pain location: R knee Pain description: Sore Aggravating factors: walking a lot  Relieving factors: Deep Blue Cream, rest  FALLS: Has patient fallen in last 6 months? No  LIVING ENVIRONMENT: Lives with: lives with their spouse Lives in: 2 level but pt resides on the first level Stairs: Yes: Internal: 3 steps; can reach both Has following equipment at home: Walker - 2 wheeled, wc, elevated toilet seat, grab bar at toilet, walk-in shower with shower stool, 3in1 commode for shower chair, multiple grab bars in the shower, bed rail   PLOF: Needs assistance with ADLs, Has caregiver services from [facility] 3 days a week, 4 hours each day.  They help with LB dressing, making breakfast, donning compression stockings, and household chores.  PATIENT GOALS: "Improve ability to use keyboard."   OBJECTIVE:   HAND DOMINANCE: Right  ADLs: Overall ADLs: caregiver assist for all ADLs Transfers/ambulation related to ADLs: supv/SBA with RW Eating: assist to cut food on occasion Grooming: set up, electric razor and toothbrush UB Dressing: some buttons are difficulty, extra time to manage clothing fasteners, including buttons and zippers LB Dressing: max A; can assist with  hiking  Toileting: modified indep  Bathing: mod A Tub Shower transfers: SBA Equipment:  see above   IADLs: Shopping: dep Light housekeeping: dep Meal Prep: dep Community mobility: dep Medication management: supv; spouse orders meds and pt sets them up in a pill organizer, spouse puts them in pudding Financial management: spouse manages Handwriting: 25% legible  MOBILITY STATUS: RW; transport chair for eval  ACTIVITY TOLERANCE: Activity tolerance: To be further assessed; [facility] for eval  FUNCTIONAL OUTCOME MEASURES: FOTO: 48; predicted 51  UPPER EXTREMITY ROM:  BUEs grossly WFL  UPPER EXTREMITY MMT:  BUEs grossly 5/5  HAND FUNCTION: Grip strength: Right: 55 lbs; Left: 50 lbs, Lateral pinch: Right: 17 lbs, Left:  17 lbs, and 3 point pinch: Right: 14 lbs, Left: 16 lbs  COORDINATION: Finger Nose Finger test: RUE mild ataxia, LUE moderate 9 Hole Peg test: Right: 45 sec; Left: 58 sec SENSATION: Reports tingling in R thumb and IF after CVA in 2012  EDEMA: none  MUSCLE TONE: normal  COGNITION: Overall cognitive status: History of cognitive impairments - at baseline  VISION: Wears glasses  PRAXIS: Impaired: Motor planning  OBSERVATIONS: BUE tremor, L worse than R  TODAY'S TREATMENT:                                                                                                                              Therapeutic Exercise: Reviewed theraputty exercises for increasing strength and coordination in bilat hands, including gross grasping, lateral/2 point/3 point pinching, digit abd/add, and digging coins out of putty.  Pt shows indep with exercises with good ability to follow written handout.  Encouraged pt continue to work on these exercises at home regularly and no need to review in clinic as pt shows indep.   Self Care: Continued focus on handwriting trials.  Pt required 1 vc today for positioning and WB through entire forearms on table top to provide more distal stability. Continued cues to increase letter spacing and letter height writing with capital letters only.  Encouraged pt try to utilize the lines on paper as a cue to increase letter height.  Letters tend to crowd with more repetition and pt verbalized hand fatigue, requiring frequent rest breaks.  OT provided cues to reduce pressure to pen on paper to lessen fatigue, though the pressure also helps to reduce his tremor.  Encouraged pt work to try and find that balance.  PATIENT EDUCATION: Education details: AE for grooming (1 handed nail clips and built up grip for nail file) Person educated: Patient and Spouse Education method: Explanation, demo, handout issued (provided link to obtain if desired) Education comprehension: verbalized  understanding, demonstrated understanding, further training needed  HOME EXERCISE PROGRAM: Handwriting, computer use, theraputty  GOALS: Goals reviewed with patient? Yes  SHORT TERM GOALS: Target date: 12/28/22 (6 weeks)  Pt will perform HEP for improving FMC/dexterity skills in BUEs with set up/supv. Baseline: Eval: not yet initiated Goal status: INITIAL  2.  Pt  will identify 1-2 compensatory strategies to reduce UE tremors during daily tasks. Baseline: Eval: Educ not yet initiated Goal status: INITIAL  LONG TERM GOALS: Target date: 02/08/24 (12 weeks)  Pt will increase FOTO score to 51 or better to indicate clinically relevant improvement in self perceived performance of daily tasks. Baseline: Eval: 48 Goal status: INITIAL  2.  Pt will cut meat with set up/supv using adapted utensils as needed. Baseline: Eval: spouse cuts food Goal status: INITIAL  3.  Pt will improve handwriting legibility to 75% with use of adapted writing utensils as needed. Baseline: Eval: 25% legibility with standard pen Goal status: INITIAL  4.  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  Baseline: Eval: typing skills to be assessed with typing test next session Goal status: INITIAL  ASSESSMENT:   CLINICAL IMPRESSION: Pt returns today after being out for the last 2 weeks d/t the holiday and a respiratory illness.  Pt reports he's feeling much better.  Theraputty HEP reviewed with pt showing indep to follow written handout and perform exercises accurately.  Continued focus on handwriting trials.  Pt required 1 vc today for positioning and WB through entire forearms on table top to provide more distal stability. Continued cues to increase letter spacing and letter height writing with capital letters only.  Encouraged pt try to utilize the lines on paper as a cue to increase letter height.  Letters tend to crowd with more repetition and  pt verbalized hand fatigue, requiring frequent rest breaks.  OT provided cues to reduce pressure to pen on paper to lessen fatigue, though the pressure also helps to reduce his tremor.  Encouraged pt work to try and find that balance.  Pt will benefit from continued skilled OT for optimizing FMC/dexterity skills and ADL/AE training/compensatory training in order to maximize indep with ADLs and leisure activities to improve QOL.  PERFORMANCE DEFICITS: in functional skills including ADLs, coordination, dexterity, sensation, pain, Fine motor control, Gross motor control, body mechanics, decreased knowledge of use of DME, and UE functional use, and psychosocial skills including coping strategies and environmental adaptation.   IMPAIRMENTS: are limiting patient from ADLs, IADLs, and leisure.   CO-MORBIDITIES: has co-morbidities such as CVA  that affects occupational performance. Patient will benefit from skilled OT to address above impairments and improve overall function.  MODIFICATION OR ASSISTANCE TO COMPLETE EVALUATION: No modification of tasks or assist necessary to complete an evaluation.  OT OCCUPATIONAL PROFILE AND HISTORY: Problem focused assessment: Including review of records relating to presenting problem.  CLINICAL DECISION MAKING: Moderate - several treatment options, min-mod task modification necessary  REHAB POTENTIAL: Fair    EVALUATION COMPLEXITY: Moderate    PLAN:  OT FREQUENCY: 2x/week  OT DURATION: 12 weeks  PLANNED INTERVENTIONS: self care/ADL training, therapeutic exercise, therapeutic activity, neuromuscular re-education, patient/family education, coping strategies training, and DME and/or AE instructions  RECOMMENDED OTHER SERVICES: none at this time  CONSULTED AND AGREED WITH PLAN OF CARE: Patient and family member/caregiver  PLAN FOR NEXT SESSION: see above  Leta Speller, MS, OTR/L  Darleene Cleaver, OT 12/29/2022, 8:32 AM

## 2022-12-30 ENCOUNTER — Ambulatory Visit: Payer: Medicare Other

## 2023-01-04 ENCOUNTER — Encounter: Payer: Medicare Other | Admitting: Speech Pathology

## 2023-01-04 ENCOUNTER — Ambulatory Visit: Payer: Medicare Other

## 2023-01-04 DIAGNOSIS — R278 Other lack of coordination: Secondary | ICD-10-CM

## 2023-01-04 DIAGNOSIS — G250 Essential tremor: Secondary | ICD-10-CM

## 2023-01-04 DIAGNOSIS — M6281 Muscle weakness (generalized): Secondary | ICD-10-CM | POA: Diagnosis not present

## 2023-01-05 NOTE — Therapy (Signed)
OUTPATIENT OCCUPATIONAL THERAPY NEURO TREATMENT  Patient Name: Mike Holloway MRN: [id number] DOB:1941/09/05, 82 y.o., male Today's Date: 01/05/2023  PCP: Dr. Maudie Holloway REFERRING PROVIDER: [name], PA  END OF SESSION:  OT End of Session - 01/05/23 1315     Visit Number 8    Number of Visits 24    Date for OT Re-Evaluation 02/08/23    Authorization Time Period Reporting period beginning 11/16/22    Progress Note Due on Visit 10    OT Start Time 1300    OT Stop Time 1345    OT Time Calculation (min) 45 min    Equipment Utilized During Treatment transport chair, RW    Activity Tolerance Patient tolerated treatment well    Behavior During Therapy WFL for tasks assessed/performed            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   SIADH (syndrome of inappropriate ADH production) (HCC)    Sleep apnea    Stroke [facility])    Past Surgical History:  Procedure Laterality Date   ABLATION     APPENDECTOMY     CATARACT EXTRACTION, BILATERAL     COLONOSCOPY WITH PROPOFOL N/A 03/17/2018   Procedure: COLONOSCOPY WITH PROPOFOL;  Surgeon: Mike Mood, MD;  Location: [facility] 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   Procedure: FECAL TRANSPLANT;  Surgeon: Mike Mood, MD;  Location: [facility] ENDOSCOPY;  Service: Gastroenterology;  Laterality: N/A;   HEMORRHOID SURGERY     HERNIA REPAIR     HIP FRACTURE SURGERY     INTRAMEDULLARY (IM) NAIL INTERTROCHANTERIC Right 09/16/2018   Procedure: INTRAMEDULLARY (IM) NAIL INTERTROCHANTRIC;  Surgeon: Mike Bucker, MD;  Location: [facility] ORS;  Service: Orthopedics;  Laterality: Right;   IR CATHETER TUBE CHANGE  11/22/2018    NASAL SINUS SURGERY     ORCHIECTOMY     TONSILLECTOMY     Patient Active Problem List   Diagnosis Date Noted   DVT femoral (deep venous thrombosis) with thrombophlebitis, left 02/06/2020   Lymphedema 04/15/2019   Bilateral leg edema 01/25/2019   Hyponatremia 12/24/2018   SIADH (syndrome of inappropriate ADH production) 12/24/2018   Erosion of urethra due to catheterization of urinary tract 10/27/2018   Generalized weakness 10/14/2018   Hip fracture 09/15/2018   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   HLD (hyperlipidemia) 03/24/2018   Contusion of right knee 02/14/2018   Bradycardia 12/28/2017   Status post right unicompartmental knee replacement 11/03/2017   Tremor 09/04/2016   Chronic pain of right knee 08/10/2016   Right ankle pain 08/10/2016   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   Non-Hodgkin's lymphoma 12/11/2015   Urinary retention 09/08/2015   Lymphoma, non-Hodgkin's 04/03/2015   Breathlessness on exertion 11/21/2014   Breath shortness 11/21/2014   Arthropathy 11/07/2014   Atrial flutter, paroxysmal (HCC) 11/07/2014   Type 2 diabetes mellitus (HCC) 11/07/2014   Benign essential tremor 11/07/2014   Benign essential HTN 11/07/2014   Mixed incontinence 11/07/2014   Hypercholesterolemia without hypertriglyceridemia 11/07/2014   Apnea, sleep 11/07/2014  Controlled type 2 diabetes mellitus without complication 11/07/2014   Pure hypercholesterolemia 11/07/2014   Other abnormalities of gait and mobility 11/02/2011   Decreased mobility 11/02/2011   Abnormal gait 06/29/2011   Discoordination 06/29/2011   ONSET DATE: DBS revision April 2019  REFERRING DIAG: G25.0 (ICD-10-CM) - Essential tremorZ96.89 (ICD-10-CM) - Presence of other specified functional implantsR41.89 (ICD-10-CM) - Other symptoms and signs involving cognitive functions and awareness  THERAPY DIAG: lack of  coordination; essential tremor  Rationale for Evaluation and Treatment: Rehabilitation  SUBJECTIVE:  SUBJECTIVE STATEMENT: Pt reports that daughter has helped him with ordering a nail file holder like the one he practiced with during a recent OT session, as well as adapted nail clippers, but the nail file holder will no longer be delivered as it is not in stock and the company did not offer alternative options.  (OT instead provided built up foam grip to try at home with nail file).   Pt accompanied by: significant other, (spouse, Mike Holloway)  PERTINENT HISTORY: Per chart: Initial History of Present Illness from 04/02/2015 from Dr. Ardyth Man: [name] has had tremor since a young age, though experienced a marked worsening around 2002 after treatment for NHL. He was treated with numerous medications without adequate benefit. In 2009 he underwent left brain thalamic deep brain stimulation. He had marked benefit for about 18 months after his initial surgery. He required a lead revision at which time he had a lead thalamotomy for the right side. 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 He has residual numbness around the mouth and in the right hand.   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  Interval History Since Last Visit: Mike Holloway is a 82 y.o. right handed male who returns to the [facility] at [facility] for follow up of essential tremor which began at a young age.  He was last seen in clinic on 04/07/2022. He is here with his wife Mike Holloway who helps provide the history. He continues to have severe tremor on the left that continues to progress over time. HE also continues to have difficulty with his speech. Right hand tremor  reasonably well controlled.   Tremor control has been limited by dysarthria which worsens with increase in stimulation to left body.  PRECAUTIONS: Fall  WEIGHT BEARING RESTRICTIONS: No  PAIN:  Are you having pain? Yes: NPRS scale: 1/10 Pain location: R knee Pain description: Sore Aggravating factors: walking a lot  Relieving factors: Deep Blue Cream, rest  FALLS: Has patient fallen in last 6 months? No  LIVING ENVIRONMENT: Lives with: lives with their spouse Lives in: 2 level but pt resides on the first level Stairs: Yes: Internal: 3 steps; can reach both Has following equipment at home: Walker - 2 wheeled, wc, elevated toilet seat, grab bar at toilet, walk-in shower with shower stool, 3in1 commode for shower chair, multiple grab bars in the shower, bed rail   PLOF: Needs assistance with ADLs, Has caregiver services from [facility] 3 days a week, 4 hours each day.  They help with LB dressing, making breakfast, donning compression stockings, and household chores.  PATIENT GOALS: "Improve ability to use keyboard."   OBJECTIVE:   HAND DOMINANCE: Right  ADLs: Overall ADLs: caregiver assist for  all ADLs Transfers/ambulation related to ADLs: supv/SBA with RW Eating: assist to cut food on occasion Grooming: set up, electric razor and toothbrush UB Dressing: some buttons are difficulty, extra time to manage clothing fasteners, including buttons and zippers LB Dressing: max A; can assist with hiking  Toileting: modified indep  Bathing: mod A Tub Shower transfers: SBA Equipment:  see above   IADLs: Shopping: dep Light housekeeping: dep Meal Prep: dep Community mobility: dep Medication management: supv; spouse orders meds and pt sets them up in a pill organizer, spouse puts them in pudding Financial management: spouse manages Handwriting: 25% legible  MOBILITY STATUS: RW; transport chair for eval  ACTIVITY TOLERANCE: Activity tolerance: To be further assessed; [facility] for  eval  FUNCTIONAL OUTCOME MEASURES: FOTO: 48; predicted 51  UPPER EXTREMITY ROM:  BUEs grossly WFL  UPPER EXTREMITY MMT:  BUEs grossly 5/5  HAND FUNCTION: Grip strength: Right: 55 lbs; Left: 50 lbs, Lateral pinch: Right: 17 lbs, Left: 17 lbs, and 3 point pinch: Right: 14 lbs, Left: 16 lbs  COORDINATION: Finger Nose Finger test: RUE mild ataxia, LUE moderate 9 Hole Peg test: Right: 45 sec; Left: 58 sec SENSATION: Reports tingling in R thumb and IF after CVA in 2012  EDEMA: none  MUSCLE TONE: normal  COGNITION: Overall cognitive status: History of cognitive impairments - at baseline  VISION: Wears glasses  PRAXIS: Impaired: Motor planning  OBSERVATIONS: BUE tremor, L worse than R  TODAY'S TREATMENT:                                                                                                                              Self Care: Continued focus on handwriting trials.  Pt demonstrated independence today with positioning and WB through entire forearms on table top to provide more distal stability.  Pt practiced handwriting via compiling a written list of strategies that pt has learned to optimize legibility and to provide a general review to assess recall of education.  Pt continues to require min vc for spacing his letters, but otherwise was able to verbalize, utilize, and write down all other strategies this date, including positioning, WB, and use of capital and printed letters when writing with his weighted pen, all with ~95% legibility.  Reviewed benefits of using reduced pressure to pen on paper to lessen hand fatigue, but still using enough pressure to reduce his tremor.  Continued encouragement provided for pt work to try and find that balance.  Issued built up foam handle for pt to use with his nail file at home.  Pt appreciative and will plan try this.  PATIENT EDUCATION: Education details: Adult nurse Person educated: Patient and Spouse Education method:  Explanation, demo Education comprehension: verbalized understanding, demonstrated understanding, further training needed  HOME EXERCISE PROGRAM: Handwriting, computer use, theraputty  GOALS: Goals reviewed with patient? Yes  SHORT TERM GOALS: Target date: 12/28/22 (6 weeks)  Pt will perform HEP for improving FMC/dexterity skills in BUEs with  set up/supv. Baseline: Eval: not yet initiated Goal status: INITIAL  2.  Pt will identify 1-2 compensatory strategies to reduce UE tremors during daily tasks. Baseline: Eval: Educ not yet initiated Goal status: INITIAL  LONG TERM GOALS: Target date: 02/08/24 (12 weeks)  Pt will increase FOTO score to 51 or better to indicate clinically relevant improvement in self perceived performance of daily tasks. Baseline: Eval: 48 Goal status: INITIAL  2.  Pt will cut meat with set up/supv using adapted utensils as needed. Baseline: Eval: spouse cuts food Goal status: INITIAL  3.  Pt will improve handwriting legibility to 75% with use of adapted writing utensils as needed. Baseline: Eval: 25% legibility with standard pen Goal status: INITIAL  4.  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  Baseline: Eval: typing skills to be assessed with typing test next session Goal status: INITIAL  ASSESSMENT:   CLINICAL IMPRESSION: Reviewed goals and poc with pt and spouse, with plan to d/c within the next 1-2 visits.  Both pt and spouse in agreement with plan.  Pt reviewed and implemented handwriting strategies today, and continues to show good carry over with compensatory strategies to minimize tremors, requiring min vc for increasing letter spacing.  Overall, handwriting legibility has improved to ~95% legibility when pt utilizes his compensatory strategies, and when given extra time and rest breaks.  Spouse reports that pt was able to sign his name on a legal document at the doctor's office  the other day, which he has not attempted in years, as spouse typically has to sign for him.  Remaining visits will continue to focus on ADL/AE training/compensatory strategies to manage tremors and maximize indep with daily tasks.    PERFORMANCE DEFICITS: in functional skills including ADLs, coordination, dexterity, sensation, pain, Fine motor control, Gross motor control, body mechanics, decreased knowledge of use of DME, and UE functional use, and psychosocial skills including coping strategies and environmental adaptation.   IMPAIRMENTS: are limiting patient from ADLs, IADLs, and leisure.   CO-MORBIDITIES: has co-morbidities such as CVA  that affects occupational performance. Patient will benefit from skilled OT to address above impairments and improve overall function.  MODIFICATION OR ASSISTANCE TO COMPLETE EVALUATION: No modification of tasks or assist necessary to complete an evaluation.  OT OCCUPATIONAL PROFILE AND HISTORY: Problem focused assessment: Including review of records relating to presenting problem.  CLINICAL DECISION MAKING: Moderate - several treatment options, min-mod task modification necessary  REHAB POTENTIAL: Fair    EVALUATION COMPLEXITY: Moderate    PLAN:  OT FREQUENCY: 2x/week  OT DURATION: 12 weeks  PLANNED INTERVENTIONS: self care/ADL training, therapeutic exercise, therapeutic activity, neuromuscular re-education, patient/family education, coping strategies training, and DME and/or AE instructions  RECOMMENDED OTHER SERVICES: none at this time  CONSULTED AND AGREED WITH PLAN OF CARE: Patient and family member/caregiver  PLAN FOR NEXT SESSION: see above  Danelle EarthlyNancy Ji Fairburn, MS, OTR/L  Otis DialsNancy K Emiley Digiacomo, OT 01/05/2023, 1:17 PM

## 2023-01-06 ENCOUNTER — Ambulatory Visit: Payer: Medicare Other

## 2023-01-06 DIAGNOSIS — R278 Other lack of coordination: Secondary | ICD-10-CM

## 2023-01-06 DIAGNOSIS — G250 Essential tremor: Secondary | ICD-10-CM

## 2023-01-06 DIAGNOSIS — M6281 Muscle weakness (generalized): Secondary | ICD-10-CM | POA: Diagnosis not present

## 2023-01-09 NOTE — Therapy (Signed)
OUTPATIENT OCCUPATIONAL THERAPY NEURO TREATMENT  Patient Name: Mike Holloway MRN: [id number] DOB:08-27-1941, 82 y.o., male Today's Date: 01/09/2023  PCP: Dr. [name] REFERRING PROVIDER: [name], PA  END OF SESSION:  OT End of Session - 01/09/23 1539     Visit Number 9    Number of Visits 24    Date for OT Re-Evaluation 02/08/23    Authorization Time Period Reporting period beginning 11/16/22    Progress Note Due on Visit 10    OT Start Time 1300    OT Stop Time 1345    OT Time Calculation (min) 45 min    Equipment Utilized During Treatment transport chair, RW    Activity Tolerance Patient tolerated treatment well    Behavior During Therapy WFL for tasks assessed/performed            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   SIADH (syndrome of inappropriate ADH production) (HCC)    Sleep apnea    Stroke [facility])    Past Surgical History:  Procedure Laterality Date   ABLATION     APPENDECTOMY     CATARACT EXTRACTION, BILATERAL     COLONOSCOPY WITH PROPOFOL N/A 03/17/2018   Procedure: COLONOSCOPY WITH PROPOFOL;  Surgeon: [name], MD;  Location: [facility] 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   Procedure: FECAL TRANSPLANT;  Surgeon: [name], MD;  Location: [facility] ENDOSCOPY;  Service: Gastroenterology;  Laterality: N/A;   HEMORRHOID SURGERY     HERNIA REPAIR     HIP FRACTURE SURGERY     INTRAMEDULLARY (IM) NAIL INTERTROCHANTERIC Right 09/16/2018   Procedure: INTRAMEDULLARY (IM) NAIL INTERTROCHANTRIC;  Surgeon: [name], MD;  Location: [facility] ORS;  Service: Orthopedics;  Laterality: Right;   IR CATHETER TUBE CHANGE  11/22/2018    NASAL SINUS SURGERY     ORCHIECTOMY     TONSILLECTOMY     Patient Active Problem List   Diagnosis Date Noted   DVT femoral (deep venous thrombosis) with thrombophlebitis, left 02/06/2020   Lymphedema 04/15/2019   Bilateral leg edema 01/25/2019   Hyponatremia 12/24/2018   SIADH (syndrome of inappropriate ADH production) 12/24/2018   Erosion of urethra due to catheterization of urinary tract 10/27/2018   Generalized weakness 10/14/2018   Hip fracture 09/15/2018   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   HLD (hyperlipidemia) 03/24/2018   Contusion of right knee 02/14/2018   Bradycardia 12/28/2017   Status post right unicompartmental knee replacement 11/03/2017   Tremor 09/04/2016   Chronic pain of right knee 08/10/2016   Right ankle pain 08/10/2016   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   Non-Hodgkin's lymphoma 12/11/2015   Urinary retention 09/08/2015   Lymphoma, non-Hodgkin's 04/03/2015   Breathlessness on exertion 11/21/2014   Breath shortness 11/21/2014   Arthropathy 11/07/2014   Atrial flutter, paroxysmal (HCC) 11/07/2014   Type 2 diabetes mellitus (HCC) 11/07/2014   Benign essential tremor 11/07/2014   Benign essential HTN 11/07/2014   Mixed incontinence 11/07/2014   Hypercholesterolemia without hypertriglyceridemia 11/07/2014   Apnea, sleep 11/07/2014  Controlled type 2 diabetes mellitus without complication 11/07/2014   Pure hypercholesterolemia 11/07/2014   Other abnormalities of gait and mobility 11/02/2011   Decreased mobility 11/02/2011   Abnormal gait 06/29/2011   Discoordination 06/29/2011   ONSET DATE: DBS revision April 2019  REFERRING DIAG: G25.0 (ICD-10-CM) - Essential tremorZ96.89 (ICD-10-CM) - Presence of other specified functional implantsR41.89 (ICD-10-CM) - Other symptoms and signs involving cognitive functions and awareness  THERAPY DIAG: lack of  coordination; essential tremor  Rationale for Evaluation and Treatment: Rehabilitation  SUBJECTIVE:  SUBJECTIVE STATEMENT: Pt reports doing well today. Pt accompanied by: significant other, (spouse, Pat)  PERTINENT HISTORY: Per chart: Initial History of Present Illness from 04/02/2015 from Dr. Ardyth Man: Mr. Baird has had tremor since a young age, though experienced a marked worsening around 2002 after treatment for NHL. He was treated with numerous medications without adequate benefit. In 2009 he underwent left brain thalamic deep brain stimulation. He had marked benefit for about 18 months after his initial surgery. He required a lead revision at which time he had a lead thalamotomy for the right side. 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 He has residual numbness around the mouth and in the right hand.   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  Interval History Since Last Visit: [name] is a 82 y.o. right handed male who returns to the [facility] at [facility] for follow up of essential tremor which began at a young age.  He was last seen in clinic on 04/07/2022. He is here with his wife Dennie Bible who helps provide the history. He continues to have severe tremor on the left that continues to progress over time. HE also continues to have difficulty with his speech. Right hand tremor reasonably well controlled.   Tremor control has been limited by dysarthria which worsens with increase in stimulation to left body.  PRECAUTIONS: Fall  WEIGHT BEARING RESTRICTIONS: No  PAIN:  Are you having pain? Yes: NPRS scale: 1/10 Pain location: R knee Pain description: Sore Aggravating factors: walking a lot  Relieving  factors: Deep Blue Cream, rest  FALLS: Has patient fallen in last 6 months? No  LIVING ENVIRONMENT: Lives with: lives with their spouse Lives in: 2 level but pt resides on the first level Stairs: Yes: Internal: 3 steps; can reach both Has following equipment at home: Walker - 2 wheeled, wc, elevated toilet seat, grab bar at toilet, walk-in shower with shower stool, 3in1 commode for shower chair, multiple grab bars in the shower, bed rail   PLOF: Needs assistance with ADLs, Has caregiver services from [facility] 3 days a week, 4 hours each day.  They help with LB dressing, making breakfast, donning compression stockings, and household chores.  PATIENT GOALS: "Improve ability to use keyboard."   OBJECTIVE:   HAND DOMINANCE: Right  ADLs: Overall ADLs: caregiver assist for all ADLs Transfers/ambulation related to ADLs: supv/SBA with RW Eating: assist to cut food on occasion Grooming: set up, electric razor and toothbrush UB Dressing: some buttons are difficulty, extra time to manage clothing fasteners, including buttons and zippers LB Dressing: max A; can assist with hiking  Toileting: modified indep  Bathing: mod A Tub Shower transfers: SBA Equipment:  see above   IADLs:  Shopping: dep Light housekeeping: dep Meal Prep: dep Community mobility: dep Medication management: supv; spouse orders meds and pt sets them up in a pill organizer, spouse puts them in pudding Financial management: spouse manages Handwriting: 25% legible  MOBILITY STATUS: RW; transport chair for eval  ACTIVITY TOLERANCE: Activity tolerance: To be further assessed; [facility] for eval  FUNCTIONAL OUTCOME MEASURES: FOTO: 48; predicted 51  UPPER EXTREMITY ROM:  BUEs grossly WFL  UPPER EXTREMITY MMT:  BUEs grossly 5/5  HAND FUNCTION: Grip strength: Right: 55 lbs; Left: 50 lbs, Lateral pinch: Right: 17 lbs, Left: 17 lbs, and 3 point pinch: Right: 14 lbs, Left: 16 lbs  COORDINATION: Finger Nose Finger test:  RUE mild ataxia, LUE moderate 9 Hole Peg test: Right: 45 sec; Left: 58 sec SENSATION: Reports tingling in R thumb and IF after CVA in 2012  EDEMA: none  MUSCLE TONE: normal  COGNITION: Overall cognitive status: History of cognitive impairments - at baseline  VISION: Wears glasses  PRAXIS: Impaired: Motor planning  OBSERVATIONS: BUE tremor, L worse than R  TODAY'S TREATMENT:                                                                                                                              Self Care: Continued focus on handwriting trials.  Pt demonstrated independence today with positioning and WB through entire forearms on table top to provide more distal stability.  Pt practiced handwriting with a double folded piece of paper to simulate greeting card size.  Pt practiced signing his name at the bottom of the card x4 trials, working within a confined space to simulate signing a holiday card for a family member.  Pt continues to require min vc for spacing his letters but found that he had increased comfort and control using a pencil rather than a pen.  Pen weights were transferred from pen to pencil.    Pt practiced with his new wide base nail clippers.  Practiced trials with hand on table top and hands against chest.  Pt found hands against chest to provide more stability than on table top.    PATIENT EDUCATION: Education details: Adult nurse Person educated: Patient and Spouse Education method: Explanation, demo Education comprehension: verbalized understanding, demonstrated understanding, further training needed  HOME EXERCISE PROGRAM: Handwriting, computer use, theraputty  GOALS: Goals reviewed with patient? Yes  SHORT TERM GOALS: Target date: 12/28/22 (6 weeks)  Pt will perform HEP for improving FMC/dexterity skills in BUEs with set up/supv. Baseline: Eval: not yet initiated Goal status: INITIAL  2.  Pt will identify 1-2 compensatory strategies to  reduce UE tremors during daily tasks. Baseline: Eval: Educ not yet initiated Goal status: INITIAL  LONG TERM GOALS: Target date: 02/08/24 (12 weeks)  Pt will increase FOTO score to 51 or better to indicate clinically relevant improvement in self perceived performance of daily tasks. Baseline: Eval: 48 Goal status: INITIAL  2.  Pt will cut meat with set  up/supv using adapted utensils as needed. Baseline: Eval: spouse cuts food Goal status: INITIAL  3.  Pt will improve handwriting legibility to 75% with use of adapted writing utensils as needed. Baseline: Eval: 25% legibility with standard pen Goal status: INITIAL  4.  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  Baseline: Eval: typing skills to be assessed with typing test next session Goal status: INITIAL  ASSESSMENT:   CLINICAL IMPRESSION: Continued focus on handwriting trials today.  Pt practiced signing his name at the bottom of a double folded piece of paper x4 trials, working within a confined space to simulate signing a holiday card for a family member.  Pt continues to require min vc for spacing his letters but found that he had increased comfort and control using a pencil rather than a pen.  Pen weights were transferred from pen to pencil.  Pt practiced with his new wide base nail clippers with trials with hand on table top and hands against chest.  Pt found hands against chest to provide more stability than on table top.  Pt also reports that he used his nail file over the weekend with the built up foam grip, which he found was helpful.  Planning discharge next session with review as needed of AE/adapted strategies to manage tremors.  Pt/spouse in agreement with poc.    PERFORMANCE DEFICITS: in functional skills including ADLs, coordination, dexterity, sensation, pain, Fine motor control, Gross motor control, body mechanics, decreased knowledge of use of DME,  and UE functional use, and psychosocial skills including coping strategies and environmental adaptation.   IMPAIRMENTS: are limiting patient from ADLs, IADLs, and leisure.   CO-MORBIDITIES: has co-morbidities such as CVA  that affects occupational performance. Patient will benefit from skilled OT to address above impairments and improve overall function.  MODIFICATION OR ASSISTANCE TO COMPLETE EVALUATION: No modification of tasks or assist necessary to complete an evaluation.  OT OCCUPATIONAL PROFILE AND HISTORY: Problem focused assessment: Including review of records relating to presenting problem.  CLINICAL DECISION MAKING: Moderate - several treatment options, min-mod task modification necessary  REHAB POTENTIAL: Fair    EVALUATION COMPLEXITY: Moderate    PLAN:  OT FREQUENCY: 2x/week  OT DURATION: 12 weeks  PLANNED INTERVENTIONS: self care/ADL training, therapeutic exercise, therapeutic activity, neuromuscular re-education, patient/family education, coping strategies training, and DME and/or AE instructions  RECOMMENDED OTHER SERVICES: none at this time  CONSULTED AND AGREED WITH PLAN OF CARE: Patient and family member/caregiver  PLAN FOR NEXT SESSION: see above  Danelle Earthly, MS, OTR/L  Otis Dials, OT 01/09/2023, 3:40 PM

## 2023-01-11 ENCOUNTER — Encounter: Payer: Medicare Other | Admitting: Speech Pathology

## 2023-01-11 ENCOUNTER — Ambulatory Visit: Payer: Medicare Other

## 2023-01-11 DIAGNOSIS — G250 Essential tremor: Secondary | ICD-10-CM

## 2023-01-11 DIAGNOSIS — M6281 Muscle weakness (generalized): Secondary | ICD-10-CM | POA: Diagnosis not present

## 2023-01-11 DIAGNOSIS — R278 Other lack of coordination: Secondary | ICD-10-CM

## 2023-01-11 NOTE — Therapy (Signed)
OUTPATIENT OCCUPATIONAL THERAPY NEURO TREATMENT  Patient Name: Mike Holloway MRN: [id number] DOB:08-27-1941, 82 y.o., male Today's Date: 01/09/2023  PCP: Dr. Maudie Holloway REFERRING PROVIDER: [name], PA  END OF SESSION:  OT End of Session - 01/09/23 1539     Visit Number 9    Number of Visits 24    Date for OT Re-Evaluation 02/08/23    Authorization Time Period Reporting period beginning 11/16/22    Progress Note Due on Visit 10    OT Start Time 1300    OT Stop Time 1345    OT Time Calculation (min) 45 min    Equipment Utilized During Treatment transport chair, RW    Activity Tolerance Patient tolerated treatment well    Behavior During Therapy WFL for tasks assessed/performed            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   SIADH (syndrome of inappropriate ADH production) (HCC)    Sleep apnea    Stroke [facility])    Past Surgical History:  Procedure Laterality Date   ABLATION     APPENDECTOMY     CATARACT EXTRACTION, BILATERAL     COLONOSCOPY WITH PROPOFOL N/A 03/17/2018   Procedure: COLONOSCOPY WITH PROPOFOL;  Surgeon: Mike Mood, MD;  Location: [facility] 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   Procedure: FECAL TRANSPLANT;  Surgeon: Mike Mood, MD;  Location: [facility] ENDOSCOPY;  Service: Gastroenterology;  Laterality: N/A;   HEMORRHOID SURGERY     HERNIA REPAIR     HIP FRACTURE SURGERY     INTRAMEDULLARY (IM) NAIL INTERTROCHANTERIC Right 09/16/2018   Procedure: INTRAMEDULLARY (IM) NAIL INTERTROCHANTRIC;  Surgeon: Mike Bucker, MD;  Location: [facility] ORS;  Service: Orthopedics;  Laterality: Right;   IR CATHETER TUBE CHANGE  11/22/2018    NASAL SINUS SURGERY     ORCHIECTOMY     TONSILLECTOMY     Patient Active Problem List   Diagnosis Date Noted   DVT femoral (deep venous thrombosis) with thrombophlebitis, left 02/06/2020   Lymphedema 04/15/2019   Bilateral leg edema 01/25/2019   Hyponatremia 12/24/2018   SIADH (syndrome of inappropriate ADH production) 12/24/2018   Erosion of urethra due to catheterization of urinary tract 10/27/2018   Generalized weakness 10/14/2018   Hip fracture 09/15/2018   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   HLD (hyperlipidemia) 03/24/2018   Contusion of right knee 02/14/2018   Bradycardia 12/28/2017   Status post right unicompartmental knee replacement 11/03/2017   Tremor 09/04/2016   Chronic pain of right knee 08/10/2016   Right ankle pain 08/10/2016   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   Non-Hodgkin's lymphoma 12/11/2015   Urinary retention 09/08/2015   Lymphoma, non-Hodgkin's 04/03/2015   Breathlessness on exertion 11/21/2014   Breath shortness 11/21/2014   Arthropathy 11/07/2014   Atrial flutter, paroxysmal (HCC) 11/07/2014   Type 2 diabetes mellitus (HCC) 11/07/2014   Benign essential tremor 11/07/2014   Benign essential HTN 11/07/2014   Mixed incontinence 11/07/2014   Hypercholesterolemia without hypertriglyceridemia 11/07/2014   Apnea, sleep 11/07/2014  Controlled type 2 diabetes mellitus without complication 11/07/2014   Pure hypercholesterolemia 11/07/2014   Other abnormalities of gait and mobility 11/02/2011   Decreased mobility 11/02/2011   Abnormal gait 06/29/2011   Discoordination 06/29/2011   ONSET DATE: DBS revision April 2019  REFERRING DIAG: G25.0 (ICD-10-CM) - Essential tremorZ96.89 (ICD-10-CM) - Presence of other specified functional implantsR41.89 (ICD-10-CM) - Other symptoms and signs involving cognitive functions and awareness  THERAPY DIAG: lack of  coordination; essential tremor  Rationale for Evaluation and Treatment: Rehabilitation  SUBJECTIVE:  SUBJECTIVE STATEMENT: Pt reports doing well today. Pt accompanied by: significant other, (spouse, Holloway)  PERTINENT HISTORY: Per chart: Initial History of Present Illness from 04/02/2015 from Dr. Ardyth Holloway: Mr. Mike Holloway has had tremor since a young age, though experienced a marked worsening around 2002 after treatment for NHL. He was treated with numerous medications without adequate benefit. In 2009 he underwent left brain thalamic deep brain stimulation. He had marked benefit for about 18 months after his initial surgery. He required a lead revision at which time he had a lead thalamotomy for the right side. 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 He has residual numbness around the mouth and in the right hand.   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  Interval History Since Last Visit: Mike Holloway is a 82 y.o. right handed male who returns to the [facility] Clinic at [facility] for follow up of essential tremor which began at a young age.  He was last seen in clinic on 04/07/2022. He is here with his wife Mike Bible who helps provide the history. He continues to have severe tremor on the left that continues to progress over time. HE also continues to have difficulty with his speech. Right hand tremor reasonably well controlled.   Tremor control has been limited by dysarthria which worsens with increase in stimulation to left body.  PRECAUTIONS: Fall  WEIGHT BEARING RESTRICTIONS: No  PAIN:  Are you having pain? Yes: NPRS scale: 1/10 Pain location: R knee Pain description: Sore Aggravating factors: walking a lot  Relieving  factors: Deep Blue Cream, rest  FALLS: Has patient fallen in last 6 months? No  LIVING ENVIRONMENT: Lives with: lives with their spouse Lives in: 2 level but pt resides on the first level Stairs: Yes: Internal: 3 steps; can reach both Has following equipment at home: Walker - 2 wheeled, wc, elevated toilet seat, grab bar at toilet, walk-in shower with shower stool, 3in1 commode for shower chair, multiple grab bars in the shower, bed rail   PLOF: Needs assistance with ADLs, Has caregiver services from [facility] 3 days a week, 4 hours each day.  They help with LB dressing, making breakfast, donning compression stockings, and household chores.  PATIENT GOALS: "Improve ability to use keyboard."   OBJECTIVE:   HAND DOMINANCE: Right  ADLs: Overall ADLs: caregiver assist for all ADLs Transfers/ambulation related to ADLs: supv/SBA with RW Eating: assist to cut food on occasion Grooming: set up, electric razor and toothbrush UB Dressing: some buttons are difficulty, extra time to manage clothing fasteners, including buttons and zippers LB Dressing: max A; can assist with hiking  Toileting: modified indep  Bathing: mod A Tub Shower transfers: SBA Equipment:  see above   IADLs:  Shopping: dep Light housekeeping: dep Meal Prep: dep Community mobility: dep Medication management: supv; spouse orders meds and pt sets them up in a pill organizer, spouse puts them in pudding Financial management: spouse manages Handwriting: 25% legible  MOBILITY STATUS: RW; transport chair for eval  ACTIVITY TOLERANCE: Activity tolerance: To be further assessed; [facility] for eval  FUNCTIONAL OUTCOME MEASURES: FOTO: 48; predicted 51  UPPER EXTREMITY ROM:  BUEs grossly WFL  UPPER EXTREMITY MMT:  BUEs grossly 5/5  HAND FUNCTION: Grip strength: Right: 55 lbs; Left: 50 lbs, Lateral pinch: Right: 17 lbs, Left: 17 lbs, and 3 point pinch: Right: 14 lbs, Left: 16 lbs Grip strength: Right 58; L 52 Lateral  pinch: right 17 lbs, left: 21 3 point pinch: right 15 lbs, Left: 18 lbs  COORDINATION: Finger Nose Finger test: RUE mild ataxia, LUE moderate 9 Hole Peg test: Right: 45 sec; Left: 58 sec 9 hole Peg test: Right: 43 sec, Left: 54 sec      SENSATION: Reports tingling in R thumb and IF after CVA in 2012  EDEMA: none  MUSCLE TONE: normal  COGNITION: Overall cognitive status: History of cognitive impairments - at baseline  VISION: Wears glasses  PRAXIS: Impaired: Motor planning  OBSERVATIONS: BUE tremor, L worse than R  TODAY'S TREATMENT:                                                                                                                              Self Care: Continued focus on handwriting trials.  Pt demonstrated independence today with positioning and WB through entire forearms on table top to provide more distal stability.  Pt practiced handwriting with a double folded piece of paper to simulate greeting card size.  Pt practiced signing his name at the bottom of the card x4 trials, working within a confined space to simulate signing a holiday card for a family member.  Pt continues to require min vc for spacing his letters but found that he had increased comfort and control using a pencil rather than a pen.  Pen weights were transferred from pen to pencil.    Pt practiced with his new wide base nail clippers.  Practiced trials with hand on table top and hands against chest.  Pt found hands against chest to provide more stability than on table top.    PATIENT EDUCATION: Education details: Adult nurse Person educated: Patient and Spouse Education method: Explanation, demo Education comprehension: verbalized understanding, demonstrated understanding, further training needed  HOME EXERCISE PROGRAM: Handwriting, computer use, theraputty  GOALS: Goals reviewed with patient? Yes  SHORT TERM GOALS: Target date: 12/28/22 (6 weeks)  Pt will perform HEP for  improving FMC/dexterity skills in BUEs with set up/supv. Baseline: Eval: not yet initiated Goal status: INITIAL  2.  Pt will identify 1-2 compensatory strategies to reduce UE tremors during daily tasks. Baseline: Eval: Educ not yet initiated Goal status: INITIAL  LONG TERM GOALS: Target date: 02/08/24 (12 weeks)  Pt will increase FOTO score to 51 or better to indicate clinically relevant improvement in self perceived performance of daily tasks. Baseline: Eval: 48 Goal status: INITIAL  2.  Pt will cut meat with set up/supv using adapted utensils as needed. Baseline: Eval: spouse cuts food Goal status: INITIAL  3.  Pt will improve handwriting legibility to 75% with use of adapted writing utensils as needed. Baseline: Eval: 25% legibility with standard pen Goal status: INITIAL  4.  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  Baseline: Eval: typing skills to be assessed with typing test next session Goal status: INITIAL  ASSESSMENT:   CLINICAL IMPRESSION: Continued focus on handwriting trials today.  Pt practiced signing his name at the bottom of a double folded piece of paper x4 trials, working within a confined space to simulate signing a holiday card for a family member.  Pt continues to require min vc for spacing his letters but found that he had increased comfort and control using a pencil rather than a pen.  Pen weights were transferred from pen to pencil.  Pt practiced with his new wide base nail clippers with trials with hand on table top and hands against chest.  Pt found hands against chest to provide more stability than on table top.  Pt also reports that he used his nail file over the weekend with the built up foam grip, which he found was helpful.  Planning discharge next session with review as needed of AE/adapted strategies to manage tremors.  Pt/spouse in agreement with poc.    PERFORMANCE DEFICITS: in  functional skills including ADLs, coordination, dexterity, sensation, pain, Fine motor control, Gross motor control, body mechanics, decreased knowledge of use of DME, and UE functional use, and psychosocial skills including coping strategies and environmental adaptation.   IMPAIRMENTS: are limiting patient from ADLs, IADLs, and leisure.   CO-MORBIDITIES: has co-morbidities such as CVA  that affects occupational performance. Patient will benefit from skilled OT to address above impairments and improve overall function.  MODIFICATION OR ASSISTANCE TO COMPLETE EVALUATION: No modification of tasks or assist necessary to complete an evaluation.  OT OCCUPATIONAL PROFILE AND HISTORY: Problem focused assessment: Including review of records relating to presenting problem.  CLINICAL DECISION MAKING: Moderate - several treatment options, min-mod task modification necessary  REHAB POTENTIAL: Fair    EVALUATION COMPLEXITY: Moderate    PLAN:  OT FREQUENCY: 2x/week  OT DURATION: 12 weeks  PLANNED INTERVENTIONS: self care/ADL training, therapeutic exercise, therapeutic activity, neuromuscular re-education, patient/family education, coping strategies training, and DME and/or AE instructions  RECOMMENDED OTHER SERVICES: none at this time  CONSULTED AND AGREED WITH PLAN OF CARE: Patient and family member/caregiver  PLAN FOR NEXT SESSION: see above  Danelle Earthly, MS, OTR/L  Otis Dials, OT 01/09/2023, 3:40 PM

## 2023-01-13 ENCOUNTER — Ambulatory Visit: Payer: Medicare Other

## 2023-01-18 ENCOUNTER — Ambulatory Visit: Payer: Medicare Other

## 2023-01-18 ENCOUNTER — Encounter: Payer: Medicare Other | Admitting: Speech Pathology

## 2023-01-20 ENCOUNTER — Ambulatory Visit (INDEPENDENT_AMBULATORY_CARE_PROVIDER_SITE_OTHER): Payer: Medicare Other | Admitting: Physician Assistant

## 2023-01-20 ENCOUNTER — Ambulatory Visit: Payer: Medicare Other

## 2023-01-20 VITALS — BP 157/85 | HR 73

## 2023-01-20 DIAGNOSIS — Z466 Encounter for fitting and adjustment of urinary device: Secondary | ICD-10-CM | POA: Diagnosis not present

## 2023-01-20 DIAGNOSIS — R339 Retention of urine, unspecified: Secondary | ICD-10-CM

## 2023-01-20 NOTE — Progress Notes (Signed)
Cath Change/ Replacement  Patient is present today for a catheter change due to urinary retention.  8ml of water was removed from the balloon, a 16FR coude foley cath was removed without difficulty.  Patient was cleaned and prepped in a sterile fashion with betadine and 2% lidocaine jelly was instilled into the urethra. A 16 FR coude foley cath was replaced into the bladder, no complications were noted. Urine return was noted 20ml and urine was yellow in color. The balloon was filled with 10ml of sterile water. A leg bag was attached for drainage.  Patient tolerated well.    Performed by: Carman Ching, PA-C   Follow up: Return in about 4 weeks (around 02/17/2023) for Catheter exchange.

## 2023-01-25 ENCOUNTER — Ambulatory Visit: Payer: Medicare Other

## 2023-01-25 ENCOUNTER — Encounter: Payer: Medicare Other | Admitting: Speech Pathology

## 2023-01-27 ENCOUNTER — Ambulatory Visit: Payer: Medicare Other

## 2023-02-01 ENCOUNTER — Ambulatory Visit: Payer: Medicare Other

## 2023-02-03 ENCOUNTER — Ambulatory Visit: Payer: Medicare Other

## 2023-02-08 ENCOUNTER — Ambulatory Visit: Payer: Medicare Other

## 2023-02-10 ENCOUNTER — Ambulatory Visit: Payer: Medicare Other

## 2023-02-15 ENCOUNTER — Ambulatory Visit: Payer: Medicare Other

## 2023-02-17 ENCOUNTER — Ambulatory Visit: Payer: Medicare Other

## 2023-02-17 ENCOUNTER — Ambulatory Visit (INDEPENDENT_AMBULATORY_CARE_PROVIDER_SITE_OTHER): Payer: Medicare Other | Admitting: Physician Assistant

## 2023-02-17 VITALS — BP 127/74 | HR 76

## 2023-02-17 DIAGNOSIS — Z466 Encounter for fitting and adjustment of urinary device: Secondary | ICD-10-CM | POA: Diagnosis not present

## 2023-02-17 DIAGNOSIS — R339 Retention of urine, unspecified: Secondary | ICD-10-CM | POA: Diagnosis not present

## 2023-02-17 NOTE — Progress Notes (Signed)
Cath Change/ Replacement  Patient is present today for a catheter change due to urinary retention.  8ml of water was removed from the balloon, a 16FR coude foley cath was removed without difficulty.  Patient was cleaned and prepped in a sterile fashion with betadine and 2% lidocaine jelly was instilled into the urethra. A 16 FR coude foley cath was replaced into the bladder, no complications were noted. Urine return was noted 30ml and urine was yellow in color. The balloon was filled with 10ml of sterile water. A leg bag was attached for drainage.  A night bag was also given to the patient. Patient tolerated well.    Performed by: Carman Ching, PA-C   Follow up: Return in about 4 weeks (around 03/17/2023) for Catheter exchange.

## 2023-03-09 ENCOUNTER — Other Ambulatory Visit: Payer: Self-pay | Admitting: Otolaryngology

## 2023-03-09 DIAGNOSIS — R633 Feeding difficulties, unspecified: Secondary | ICD-10-CM

## 2023-03-09 DIAGNOSIS — R1312 Dysphagia, oropharyngeal phase: Secondary | ICD-10-CM

## 2023-03-09 DIAGNOSIS — R471 Dysarthria and anarthria: Secondary | ICD-10-CM

## 2023-03-15 ENCOUNTER — Ambulatory Visit
Admission: RE | Admit: 2023-03-15 | Discharge: 2023-03-15 | Disposition: A | Discharge status: Home / Self Care | Payer: Medicare Other | Source: Ambulatory Visit | Attending: Otolaryngology | Admitting: Otolaryngology

## 2023-03-15 DIAGNOSIS — R633 Feeding difficulties, unspecified: Secondary | ICD-10-CM | POA: Insufficient documentation

## 2023-03-15 DIAGNOSIS — R1312 Dysphagia, oropharyngeal phase: Secondary | ICD-10-CM | POA: Diagnosis present

## 2023-03-15 DIAGNOSIS — R471 Dysarthria and anarthria: Secondary | ICD-10-CM | POA: Insufficient documentation

## 2023-03-15 NOTE — Progress Notes (Addendum)
Modified Barium Swallow Study  Patient Details  Name: Mike Holloway MRN: [id number] Date of Birth: 1941-04-25  Today's Date: 03/15/2023  Modified Barium Swallow completed.  Full report located under Chart Review in the Imaging Section.  History of Present Illness Pt is an 82 year old male with past medical history including essential tremor (since a young age), who underwent left thalamic DBS placement in 2009. Wife reported pt has Bilateral DBS placement currently. 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 Hx also includes DM, atrial flutter s/p ablation, HTN, non-hodgkin lymphoma, CKD, and arthritis. He worked with SLP in 2019 through [place], as well as during subsequent stays at [facility], [place] and with home health. He has been consuming regular diet with thin liquids, and takes pills whole in pudding; both pt and Wife report min difficulty w/ "certain" pills -- "getting them organized on his tongue to swallow them", per Wife.  Pt is having a f/u MBSS per ENT order to monitor pt's swallowing, per pt report. Both pt and Wife deny any consistent difficulty w/ swallowing but stated "he can just start coughing sometimes even when he is not eating a meal". The coughing "spell" is eleviated when she gives him something to eat.  Wife also reports min worsening dysarthria/intelligibility in recent months and that they have a f/u appt at [facility] for the DBS.  Wife also reports increased phlegm in pt's throat at rest(outside of meals) -- pt is on a PPI 2x daily per Wife's report.       DIAGNOSTIC FINDINGS: 1. MBS 07/05/2018: "Patient presents with mild-moderate oropharyngeal dysphagia.".  2. MBS 05/04/22: "Patient presents with mild-moderate oropharyngeal dysphagia consistent with prior objective testing.".   Clinical Impression Pt presents with what appears to be mild-moderate, chronic oropharyngeal dysphagia  with contributing factors of sensorimotor deficits, mild cognitive deficits(per Neurology/Wife), weakness, and potential age-related changes.  Oral phase is characterized by adequate containment of boluses in the oral cavity w/ fair lingual control though min posterior escape of thin liquids into the pharynx during multiple/challenged sips. Slower, less organized lingual/bolus transport of increased textured bolus trials noted(primarily solid). Trace residue lining oral structures occurred but w/ lingual sweeping and a f/u, Dry swallow, the oral cavity was cleared. Swallow initiation is delayed to the level of the posterior laryngeal surface of the epiglottis w/ thin liquids -- in Larger, consecutive/challenged sips. Noted swallow initiation of thin liquids at the Valleculae w/ Single sips(more controlled).  Pharyngeal phase is noted for mildly decreased base of tongue retraction/pharyngeal pressure and laryngeal excursion resulting in min-mod residue collection in the valleculae, pyriform sinuses, and aryepiglottic folds w/ a slight coating along BOT. Using the strategy of 1-2 f/u, Dry swallows was effective in reducing the pharyngeal residue significantly. PE segment opening was functional.  Shallow laryngeal penetration occured x2 w/ thin liquids(w/ trace coating along the underside of the epiglottis being 1/2 events); apparent clearing of the noted penetrated material/trial w/ completion of the swallow. No penetration occurred w/ any other consistency/trial. NO Aspiration occurred during this study. The presentation of laryngeal penetration as pt experienced during this study today can be commensurate w/ pt's age. OF NOTE: initial clearance noted of the Upper Cervial Esophagus w/ trace-min retrograde bolus activity noted x1 during study(thins). ANY Esophageal phase Dysmotility could impact the oropharyngeal phase of swallowing.    Reviewed study findings w/ pt and Wife using video images; strongly  suggested support  at meals to implement and perform compensatory strategies effectively(1-2 f/u Dry swallows; SINGLE sips of liquids). Wife and pt verbalized understanding of strategies, and general aspiration precautions. Recommend continue a regular/mechanical soft diet w/ cut, well-moistened meats/foods, thin liquids via Cup/bottle, and consider Pills Crushed in Puree for more controlled and safer swallowing -- seek advice of pt's Pharmacist for "Crushability" of his Pills. Wife agreed. Factors that may increase risk of adverse event in presence of aspiration [employer]ance C[employer]s 2021): Reduced cognitive function;Limited mobility;Frail or deconditioned (DBS/Essential tremors - Neurologic)   Swallow Evaluation Recommendations Recommendations: PO diet PO Diet Recommendation: Regular;Thin liquids (Level 0) (cut, moistened meats/foods) Liquid Administration via: Cup;No straw Medication Administration: Crushed with puree (for more organized, controlled swallowing) Supervision: Patient able to self-feed;Intermittent supervision/cueing for swallowing strategies;Set-up assistance for safety Swallowing strategies  : Minimize environmental distractions;Slow rate;Small bites/sips;Multiple dry swallows (1-2) after each bite/sip (Lingual Sweep) Postural changes: Position pt fully upright for meals;Stay upright 30-60 min after meals;Out of bed for meals Oral care recommendations: Oral care QID (4x/day);Pt independent with oral care (setup) Recommended consults: Consider GI consultation;Consider esophageal assessment;Consider dietitian consultation (for support)          Jerilynn Som, MS, CCC-SLP Speech Language Pathologist Rehab Services; [facility] [place] [phone] (ascom) Saydie Gerdts 03/15/2023,3:09 PM

## 2023-03-18 ENCOUNTER — Ambulatory Visit (INDEPENDENT_AMBULATORY_CARE_PROVIDER_SITE_OTHER): Payer: Medicare Other | Admitting: Physician Assistant

## 2023-03-18 VITALS — BP 171/92 | HR 68 | Ht 72.0 in

## 2023-03-18 DIAGNOSIS — R339 Retention of urine, unspecified: Secondary | ICD-10-CM | POA: Diagnosis not present

## 2023-03-18 DIAGNOSIS — Z466 Encounter for fitting and adjustment of urinary device: Secondary | ICD-10-CM | POA: Diagnosis not present

## 2023-03-18 NOTE — Progress Notes (Signed)
Cath Change/ Replacement  Patient is present today for a catheter change due to urinary retention.  8ml of water was removed from the balloon, a 16FR coude foley cath was removed without difficulty.  Patient was cleaned and prepped in a sterile fashion with betadine and 2% lidocaine jelly was instilled into the urethra. A 16 FR coude foley cath was replaced into the bladder, no complications were noted. Urine return was noted 20ml and urine was yellow in color. The balloon was filled with 10ml of sterile water. A leg bag was attached for drainage.  Patient tolerated well.    Performed by: Carman Ching, PA-C   Follow up: Return in about 4 weeks (around 04/15/2023) for Catheter exchange.

## 2023-03-24 ENCOUNTER — Ambulatory Visit: Payer: Medicare Other | Admitting: Podiatry

## 2023-03-29 ENCOUNTER — Ambulatory Visit (INDEPENDENT_AMBULATORY_CARE_PROVIDER_SITE_OTHER): Payer: Medicare Other | Admitting: Podiatry

## 2023-03-29 DIAGNOSIS — L603 Nail dystrophy: Secondary | ICD-10-CM | POA: Diagnosis not present

## 2023-03-29 NOTE — Progress Notes (Signed)
Subjective:  Patient ID: Mike Holloway, male    DOB: [birth year],  MRN: [id number]  Chief Complaint  Patient presents with   Nail Problem    82 y.o. male presents with the above complaint.  Patient presents with left hallux nail dystrophy.  Patient is painful to touch he would like to have it removed he denies any other acute complaints.  He would also like to make it permanent pes cavus for 10 dull aching nature hurts with ambulation   Review of Systems: Negative except as noted in the HPI. Denies N/V/F/Ch.  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 , Disp: , Rfl:    famotidine (PEPCID) 20 MG tablet, Take 20 mg by mouth 2 (two) times daily. , Disp: , Rfl: 3   fexofenadine (ALLEGRA) 180 MG tablet, TK 1 T PO ONCE D, Disp: , Rfl:    FLUoxetine (PROZAC) 10 MG capsule, Take 10 mg by mouth daily. , Disp: , Rfl:    guaiFENesin-dextromethorphan (ROBITUSSIN DM) 100-10 MG/5ML syrup, Take 5 mLs by mouth every 4 (four) hours as needed for cough., Disp: , Rfl:    Lactobacillus (ACIDOPHILUS PO), Take by mouth daily. , Disp: , Rfl:    Melatonin 5 MG TABS, Take 5 mg by mouth at bedtime. Takes 2 3 mg tablets, Disp: , Rfl:    Methylcellulose, Laxative, (CITRUCEL PO), Take by mouth. 1 packet daily, Disp: , Rfl:    MYRBETRIQ 25 MG TB24 tablet, TAKE 1 TABLET(25 MG) BY MOUTH DAILY, Disp: 30  tablet, Rfl: 11   primidone (MYSOLINE) 50 MG tablet, Take 2 tablets (100mg ) by mouth every morning and 1 tablet (50mg ) by mouth every night at bedtime, Disp: , Rfl:    propranolol ER (INDERAL LA) 80 MG 24 hr capsule, Take by mouth. 2 in the morning, Disp: , Rfl:    UNABLE TO FIND, APPLY FROM NECK DOWN DAILY, Disp: , Rfl:    valsartan (DIOVAN) 40 MG tablet, Take 40 mg by mouth daily., Disp: , Rfl:   Social History   Tobacco Use  Smoking Status Never   Passive exposure: Never  Smokeless Tobacco Never    Allergies  Allergen Reactions   Clindamycin Diarrhea    Contracted C. Diff x 2   Flagyl [Metronidazole] Swelling    Swollen tongue, excessive shaking,     Ambien  [Zolpidem]     Other reaction(s): Other (See Comments) Disoriented and moody Other reaction(s): Other (See Comments) Disoriented and moody   Penicillins Rash    Has patient had a PCN reaction causing immediate rash, facial/tongue/throat swelling, SOB or lightheadedness with hypotension: Unknown Has patient had a PCN reaction causing severe rash involving mucus membranes or skin necrosis: Unknown Has patient  had a PCN reaction that required hospitalization: No Has patient had a PCN reaction occurring within the last 10 years: No If all of the above answers are "NO", then may proceed with Cephalosporin use.   Sulfa Antibiotics Rash   Objective:  There were no vitals filed for this visit. There is no height or weight on file to calculate BMI. Constitutional Well developed. Well nourished.  Vascular Dorsalis pedis pulses palpable bilaterally. Posterior tibial pulses palpable bilaterally. Capillary refill normal to all digits.  No cyanosis or clubbing noted. Pedal hair growth normal.  Neurologic Normal speech. Oriented to person, place, and time. Epicritic sensation to light touch grossly present bilaterally.  Dermatologic Pain on palpation of the entire/total nail on 1st digit of the left No other open wounds. No  skin lesions.  Orthopedic: Normal joint ROM without pain or crepitus bilaterally. No visible deformities. No bony tenderness.   Radiographs: None Assessment:   1. Nail dystrophy    Plan:  Patient was evaluated and treated and all questions answered.  Nail contusion/dystrophy hallux, left -Patient elects to proceed with minor surgery to remove entire toenail today. Consent reviewed and signed by patient. -Entire/total nail excised. See procedure note. -Educated on post-procedure care including soaking. Written instructions provided and reviewed. -Patient to follow up in 2 weeks for nail check.  Procedure: Excision of entire/total nail with phenol matricectomy Location: Left 1st toe digit Anesthesia: Lidocaine 1% plain; 1.5 mL and Marcaine 0.5% plain; 1.5 mL, digital block. Skin Prep: Betadine. Dressing: Silvadene; telfa; dry, sterile, compression dressing. Technique: Following skin prep, the toe was exsanguinated and a tourniquet was secured at the base of the toe. The affected nail border was freed and excised. The tourniquet was then removed and sterile dressing applied. Disposition: Patient tolerated procedure well. Patient to return in 2 weeks for follow-up.   No follow-ups on file.

## 2023-04-11 ENCOUNTER — Ambulatory Visit: Payer: Medicare Other | Admitting: Physician Assistant

## 2023-04-11 ENCOUNTER — Encounter: Payer: Self-pay | Admitting: Physician Assistant

## 2023-04-11 VITALS — BP 137/74 | HR 73

## 2023-04-11 DIAGNOSIS — Z466 Encounter for fitting and adjustment of urinary device: Secondary | ICD-10-CM

## 2023-04-11 DIAGNOSIS — R339 Retention of urine, unspecified: Secondary | ICD-10-CM | POA: Diagnosis not present

## 2023-04-11 NOTE — Progress Notes (Signed)
Cath Change/ Replacement  Patient is present today for a catheter change due to urinary retention.  8ml of water was removed from the balloon, a 16FR coude foley cath was removed without difficulty.  Patient was cleaned and prepped in a sterile fashion with betadine and 2% lidocaine jelly was instilled into the urethra. A 16 FR coude foley cath was replaced into the bladder, no complications were noted. Urine return was noted 5ml and urine was yellow in color. The balloon was filled with 10ml of sterile water. A leg bag was attached for drainage.  Patient tolerated well.    Performed by: Carman Ching, PA-C   Follow up: Return in about 4 weeks (around 05/09/2023) for Catheter exchange.

## 2023-04-26 ENCOUNTER — Telehealth: Payer: Self-pay

## 2023-04-26 MED ORDER — DOXYCYCLINE HYCLATE 100 MG PO TABS
100.0000 mg | ORAL_TABLET | Freq: Two times a day (BID) | ORAL | 0 refills | Status: DC
Start: 2023-04-26 — End: 2023-06-16

## 2023-04-26 NOTE — Telephone Encounter (Signed)
Patient's wife called - had toenail removed on 03/29/23. It is still draining and "the skin is a little funky". Does he need antibiotics? Or should she bring him in to be seen. Please advise

## 2023-04-26 NOTE — Addendum Note (Signed)
Addended by: Nicholes Rough on: 04/26/2023 10:19 AM   Modules accepted: Orders

## 2023-04-30 ENCOUNTER — Ambulatory Visit (INDEPENDENT_AMBULATORY_CARE_PROVIDER_SITE_OTHER): Payer: Medicare Other

## 2023-04-30 ENCOUNTER — Ambulatory Visit
Admission: EM | Admit: 2023-04-30 | Discharge: 2023-04-30 | Disposition: A | Discharge status: Home / Self Care | Payer: Medicare Other | Attending: Internal Medicine | Admitting: Internal Medicine

## 2023-04-30 ENCOUNTER — Encounter: Payer: Self-pay | Admitting: Emergency Medicine

## 2023-04-30 DIAGNOSIS — L03031 Cellulitis of right toe: Secondary | ICD-10-CM

## 2023-04-30 DIAGNOSIS — L02611 Cutaneous abscess of right foot: Secondary | ICD-10-CM | POA: Diagnosis not present

## 2023-04-30 DIAGNOSIS — M79675 Pain in left toe(s): Secondary | ICD-10-CM

## 2023-04-30 MED ORDER — CEPHALEXIN 500 MG PO CAPS: 500.0000 mg | ORAL_CAPSULE | Freq: Three times a day (TID) | ORAL | 0 refills | Status: DC

## 2023-04-30 MED ORDER — CEFTRIAXONE SODIUM 1 G IJ SOLR
1.0000 g | Freq: Once | INTRAMUSCULAR | Status: AC
  Administered 2023-04-30: 1 g via INTRAMUSCULAR

## 2023-04-30 NOTE — ED Provider Notes (Signed)
[facility]    CSN: [id number] Arrival date & time: 04/30/23  1414      History   Chief Complaint Chief Complaint  Patient presents with   Toe Pain    HPI Mike Holloway is a 82 y.o. male who presents with L great toe nail pain and redness since 7/22 but did not have pain til last night. Had  the nail removed on 7/2 and on 7/30 called his podiatrist due to concern this was infected so was started on Doxycycline which he has had 3 days worth. His wife states his toe looks worse. He did not have any pain before even though it had been red below the nail bed. Has not had a fever or been confused.      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   SIADH (syndrome of inappropriate ADH production) (HCC)    Sleep apnea    Stroke [facility])     Patient Active Problem List   Diagnosis Date Noted   DVT femoral (deep venous thrombosis) with thrombophlebitis, left (HCC) 02/06/2020   Lymphedema 04/15/2019   Bilateral leg edema 01/25/2019   Hyponatremia 12/24/2018   SIADH (syndrome of inappropriate ADH production) (HCC) 12/24/2018   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) 10/27/2018   Generalized weakness 10/14/2018   Hip fracture (HCC) 09/15/2018   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   HLD (hyperlipidemia) 03/24/2018   Contusion of right knee 02/14/2018   Bradycardia 12/28/2017   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   Tremor 09/04/2016   Chronic pain of right knee 08/10/2016   Right ankle pain 08/10/2016   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   Non-Hodgkin's lymphoma (HCC) 12/11/2015    Urinary retention 09/08/2015   Lymphoma, non-Hodgkin's (HCC) 04/03/2015   Breathlessness on exertion 11/21/2014   Breath shortness 11/21/2014   Arthropathy 11/07/2014   Atrial flutter, paroxysmal (HCC) 11/07/2014   Type 2 diabetes mellitus (HCC) 11/07/2014   Benign essential tremor 11/07/2014   Benign essential HTN 11/07/2014   Mixed incontinence 11/07/2014   Hypercholesterolemia without hypertriglyceridemia 11/07/2014   Apnea, sleep 11/07/2014   Controlled type 2 diabetes mellitus without complication (HCC) 11/07/2014   Pure hypercholesterolemia 11/07/2014   Other abnormalities of gait and mobility 11/02/2011   Decreased mobility 11/02/2011   Abnormal gait 06/29/2011   Discoordination 06/29/2011    Past Surgical History:  Procedure Laterality Date   ABLATION     APPENDECTOMY     CATARACT EXTRACTION, BILATERAL     COLONOSCOPY WITH PROPOFOL N/A 03/17/2018   Procedure: COLONOSCOPY WITH PROPOFOL;  Surgeon: [name], MD;  Location: [facility] 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   Procedure: FECAL TRANSPLANT;  Surgeon: [name], MD;  Location: [facility] ENDOSCOPY;  Service: Gastroenterology;  Laterality: N/A;   HEMORRHOID SURGERY     HERNIA REPAIR     HIP FRACTURE SURGERY     INTRAMEDULLARY (IM) NAIL INTERTROCHANTERIC Right 09/16/2018   Procedure: INTRAMEDULLARY (IM) NAIL INTERTROCHANTRIC;  Surgeon: [name], MD;  Location: [facility] ORS;  Service: Orthopedics;  Laterality: Right;   IR CATHETER TUBE CHANGE  11/22/2018   NASAL SINUS SURGERY     ORCHIECTOMY     TONSILLECTOMY         Home Medications    Prior to Admission medications   Medication Sig Start Date End Date Taking? Authorizing Provider  cephALEXin (KEFLEX) 500 MG capsule Take 1 capsule (500 mg total) by mouth 3 (three) times daily. 04/30/23  Yes Rodriguez-Southworth, Nettie Elm, PA-C  acetaminophen (TYLENOL) 650 MG CR tablet Take 650 mg  by mouth at bedtime.    [provider]  aspirin 325 MG tablet Take 325 mg by mouth daily.    [provider]  cholecalciferol (VITAMIN D) 25 MCG (1000 UT) tablet Take 1,000 Units by mouth at bedtime.     [provider]  doxycycline (VIBRA-TABS) 100 MG tablet Take 1 tablet (100 mg total) by mouth 2 (two) times daily. 04/26/23   Candelaria Stagers, DPM  famotidine (PEPCID) 20 MG tablet Take 20 mg by mouth 2 (two) times daily.  05/20/18   [provider]  fexofenadine (ALLEGRA) 180 MG tablet TK 1 T PO ONCE D 03/15/19   [provider]  FLUoxetine (PROZAC) 10 MG capsule Take 10 mg by mouth daily.  09/09/18   [provider]  guaiFENesin-dextromethorphan (ROBITUSSIN DM) 100-10 MG/5ML syrup Take 5 mLs by mouth every 4 (four) hours as needed for cough.    [provider]  Lactobacillus (ACIDOPHILUS PO) Take by mouth daily.     [provider]  Melatonin 5 MG TABS Take 5 mg by mouth at bedtime. Takes 2 3 mg tablets    [provider]  Methylcellulose, Laxative, (CITRUCEL PO) Take by mouth. 1 packet daily    [provider]  MYRBETRIQ 25 MG TB24 tablet TAKE 1 TABLET(25 MG) BY MOUTH DAILY 06/10/22   Vaillancourt, Lelon Mast, PA-C  primidone (MYSOLINE) 50 MG tablet Take 2 tablets (100mg ) by mouth every morning and 1 tablet (50mg ) by mouth every night at bedtime    [provider]  propranolol ER (INDERAL LA) 80 MG 24 hr capsule Take by mouth. 2 in the morning 07/07/20   [provider]  UNABLE TO FIND APPLY FROM NECK DOWN DAILY 12/23/18   [provider]  valsartan (DIOVAN) 40 MG tablet Take 40 mg by mouth daily. 07/21/20   [provider]    Family History Family History  Problem Relation Age of Onset   Hematuria Father    Prostate cancer Father    Diabetes Father    Heart disease Mother    Diabetes Mother    Kidney disease Neg Hx    Bladder Cancer Neg Hx     Social History Social  History   Tobacco Use   Smoking status: Never    Passive exposure: Never   Smokeless tobacco: Never  Vaping Use   Vaping status: Never Used  Substance Use Topics   Alcohol use: No    Alcohol/week: 0.0 standard drinks of alcohol   Drug use: No     Allergies   Clindamycin, Flagyl [metronidazole], Ambien  [zolpidem], Penicillins, and Sulfa antibiotics   Review of Systems Review of Systems  As noted in HPI Physical Exam Triage Vital Signs ED Triage Vitals  Encounter Vitals Group     BP 04/30/23 1427 (!) 140/67     Systolic BP Percentile --      Diastolic BP Percentile --  Pulse Rate 04/30/23 1427 66     Resp 04/30/23 1427 15     Temp 04/30/23 1427 98.3 F (36.8 C)     Temp Source 04/30/23 1427 Oral     SpO2 04/30/23 1427 98 %     Weight 04/30/23 1425 223 lb 1.7 oz (101.2 kg)     Height 04/30/23 1425 6' (1.829 m)     Head Circumference --      Peak Flow --      Pain Score 04/30/23 1425 5     Pain Loc --      Pain Education --      Exclude from Growth Chart --    No data found.  Updated Vital Signs BP (!) 140/67 (BP Location: Left Arm)   Pulse 66   Temp 98.3 F (36.8 C) (Oral)   Resp 15   Ht 6' (1.829 m)   Wt 223 lb 1.7 oz (101.2 kg)   SpO2 98%   BMI 30.26 kg/m   Visual Acuity Right Eye Distance:   Left Eye Distance:   Bilateral Distance:    Right Eye Near:   Left Eye Near:    Bilateral Near:     Physical Exam Vitals and nursing note reviewed.  Constitutional:      General: He is not in acute distress.    Appearance: He is not toxic-appearing.  HENT:     Right Ear: External ear normal.     Left Ear: External ear normal.     Ears:     Comments: Wears hearing aids Eyes:     General: No scleral icterus.    Conjunctiva/sclera: Conjunctivae normal.  Pulmonary:     Effort: Pulmonary effort is normal.  Musculoskeletal:     Cervical back: Neck supple.     Comments: R GREAT TOE- with slight warmth and erythema distally til 1/2 cm below nail  bed. There is not streaking. There is a pin hole in the center with slight clear yellow drainage. I cleansed the nail bed with a wet Qtip and was guarded in pain.   Skin:    General: Skin is warm and dry.  Neurological:     Mental Status: He is alert and oriented to person, place, and time.     Gait: Gait abnormal.     Comments: Uses a walker  Psychiatric:        Mood and Affect: Mood normal.        Behavior: Behavior normal.        Thought Content: Thought content normal.        Judgment: Judgment normal.    UC Treatments / Results  Labs (all labs ordered are listed, but only abnormal results are displayed) Labs Reviewed - No data to display  EKG   Radiology DG Toe Great Left  Result Date: 04/30/2023 CLINICAL DATA:  Toe infection over the last month EXAM: LEFT GREAT TOE COMPARISON:  None Available. FINDINGS: Distal soft tissue swelling. No visible bone destructive change to suggest osteomyelitis. IMPRESSION: Distal soft tissue swelling. No visible bone destructive change to suggest osteomyelitis. Electronically Signed   By: Paulina Fusi M.D.   On: 04/30/2023 15:22    Procedures Procedures (including critical care time)  Medications Ordered in UC Medications  cefTRIAXone (ROCEPHIN) injection 1 g (1 g Intramuscular Given 04/30/23 1543)    Initial Impression / Assessment and Plan / UC Course  I have reviewed the triage vital signs and the nursing notes.  Pertinent imaging  results that were available during my care of the patient were reviewed by me and considered in my medical decision making (see chart for details).  Infected R great toe  He was given Rocephin 1G IM here and placed on Keflex as noted. May d/c the Doxy. He has taken Cephalosporin before just fine. Needs to FU with his podiatrist on Monday.    Final Clinical Impressions(s) / UC Diagnoses   Final diagnoses:  Abscess or cellulitis of toe, right     Discharge Instructions      The xray does not show  signs of infection in the bone Follow up with the foot doctor on Monday.  Stop the Doxycycline and start the Cephalexin today.      ED Prescriptions     Medication Sig Dispense Auth. Provider   cephALEXin (KEFLEX) 500 MG capsule Take 1 capsule (500 mg total) by mouth 3 (three) times daily. 21 capsule Rodriguez-Southworth, Nettie Elm, PA-C      PDMP not reviewed this encounter.   Garey Ham, PA-C 05/01/23 1211

## 2023-04-30 NOTE — ED Triage Notes (Signed)
Patient states that he had his toenail removed from his left 1st toe on 03/29/23.  Patient states that they contacted his Podiatrist on 04/26/23 because his toe looked like it was infected.  Patient was started on an antibiotic.  Patient has not followed up with his Podiatrist since the surgery. Patient denies fevers.

## 2023-04-30 NOTE — Discharge Instructions (Signed)
The xray does not show signs of infection in the bone Follow up with the foot doctor on Monday.  Stop the Doxycycline and start the Cephalexin today.

## 2023-05-03 ENCOUNTER — Ambulatory Visit (INDEPENDENT_AMBULATORY_CARE_PROVIDER_SITE_OTHER): Payer: Medicare Other | Admitting: Podiatry

## 2023-05-03 DIAGNOSIS — L603 Nail dystrophy: Secondary | ICD-10-CM | POA: Diagnosis not present

## 2023-05-03 NOTE — Progress Notes (Signed)
Subjective: Mike Holloway is a 82 y.o.  male returns to office today for follow up evaluation after having left 1st total nail avulsion performed. Patient has been soaking using epsom salt and applying topical antibiotic covered with bandaid daily. Patient denies fevers, chills, nausea, vomiting. Denies any calf pain, chest pain, SOB.   Objective:  Vitals: Reviewed  General: Well developed, nourished, in no acute distress, alert and oriented x3   Dermatology: Skin is warm, dry and supple bilateral. left 1st nail bed appears to be clean, dry, with mild granular tissue and surrounding scab. There is no surrounding erythema, edema, drainage/purulence. The remaining nails appear unremarkable at this time. There are no other lesions or other signs of infection present.  Neurovascular status: Intact. No lower extremity swelling; No pain with calf compression bilateral.  Musculoskeletal: Decreased tenderness to palpation of the left 1st nail bed. Muscular strength within normal limits bilateral.   Assesement and Plan: S/p total nail avulsion, doing well.   -Discontinue soaking in epsom salts twice a day followed by antibiotic ointment and a band-aid. Can leave uncovered at night. Continue this until completely healed.  -If the area has not healed in 2 weeks, call the office for follow-up appointment, or sooner if any problems arise.  -Discussed with the patient do Betadine wet-to-dry dressing for some residual nonhealing aspect of the toe -Monitor for any signs/symptoms of infection. Call the office immediately if any occur or go directly to the emergency room. Call with any questions/concerns.  Nicholes Rough, DPM

## 2023-05-17 ENCOUNTER — Ambulatory Visit (INDEPENDENT_AMBULATORY_CARE_PROVIDER_SITE_OTHER): Payer: Medicare Other | Admitting: Physician Assistant

## 2023-05-17 DIAGNOSIS — Z466 Encounter for fitting and adjustment of urinary device: Secondary | ICD-10-CM

## 2023-05-17 DIAGNOSIS — R339 Retention of urine, unspecified: Secondary | ICD-10-CM | POA: Diagnosis not present

## 2023-05-17 NOTE — Progress Notes (Signed)
Cath Change/ Replacement  Patient is present today for a catheter change due to urinary retention.  8ml of water was removed from the balloon, a 16FR coude foley cath was removed without difficulty.  Patient was cleaned and prepped in a sterile fashion with betadine and 2% lidocaine jelly was instilled into the urethra. A 16 FR coude foley cath was replaced into the bladder, no complications were noted. Urine return was noted 20ml and urine was yellow in color. The balloon was filled with 10ml of sterile water. A leg bag was attached for drainage.  Patient tolerated well.    Performed by: Carman Ching, PA-C   Follow up: Return in about 4 weeks (around 06/14/2023) for Catheter exchange.

## 2023-05-31 ENCOUNTER — Ambulatory Visit (INDEPENDENT_AMBULATORY_CARE_PROVIDER_SITE_OTHER): Payer: Medicare Other | Admitting: Podiatry

## 2023-05-31 DIAGNOSIS — L603 Nail dystrophy: Secondary | ICD-10-CM | POA: Diagnosis not present

## 2023-05-31 NOTE — Progress Notes (Signed)
 Subjective: Mike Holloway is a 82 y.o.  male returns to office today for follow up evaluation after having left 1st total nail avulsion performed. Patient has been soaking using epsom salt and applying topical antibiotic covered with bandaid daily. Patient denies fevers, chills, nausea, vomiting. Denies any calf pain, chest pain, SOB.   Objective:  Vitals: Reviewed  General: Well developed, nourished, in no acute distress, alert and oriented x3   Dermatology: Skin is warm, dry and supple bilateral. left 1st nail bed appears to be clean, dry, with mild granular tissue and surrounding scab. There is no surrounding erythema, edema, drainage/purulence. The remaining nails appear unremarkable at this time. There are no other lesions or other signs of infection present.  Neurovascular status: Intact. No lower extremity swelling; No pain with calf compression bilateral.  Musculoskeletal: Decreased tenderness to palpation of the left 1st nail bed. Muscular strength within normal limits bilateral.   Assesement and Plan: S/p total nail avulsion, doing well.   -Discontinue soaking in epsom salts twice a day followed by antibiotic ointment and a band-aid. Can leave uncovered at night. Continue this until completely healed.  -If the area has not healed in 2 weeks, call the office for follow-up appointment, or sooner if any problems arise.  -Discussed with the patient do Betadine wet-to-dry dressing for some residual nonhealing aspect of the toe -Monitor for any signs/symptoms of infection. Call the office immediately if any occur or go directly to the emergency room. Call with any questions/concerns.  Nicholes Rough, DPM

## 2023-06-16 ENCOUNTER — Ambulatory Visit (INDEPENDENT_AMBULATORY_CARE_PROVIDER_SITE_OTHER): Payer: Medicare Other | Admitting: Physician Assistant

## 2023-06-16 DIAGNOSIS — R339 Retention of urine, unspecified: Secondary | ICD-10-CM | POA: Diagnosis not present

## 2023-06-16 DIAGNOSIS — Z466 Encounter for fitting and adjustment of urinary device: Secondary | ICD-10-CM

## 2023-06-16 NOTE — Progress Notes (Signed)
Cath Change/ Replacement  Patient is present today for a catheter change due to urinary retention.  8ml of water was removed from the balloon, a 16FR coude foley cath was removed without difficulty.  Patient was cleaned and prepped in a sterile fashion with betadine and 2% lidocaine jelly was instilled into the urethra. A 16 FR coude foley cath was replaced into the bladder, no complications were noted. Urine return was noted 30ml and urine was yellow in color. The balloon was filled with 10ml of sterile water. A leg bag was attached for drainage.  Patient tolerated well.    Performed by: Carman Ching, PA-C   Follow up: Return in about 4 weeks (around 07/14/2023) for Catheter exchange.

## 2023-06-18 ENCOUNTER — Other Ambulatory Visit: Payer: Self-pay | Admitting: Physician Assistant

## 2023-06-18 DIAGNOSIS — N3289 Other specified disorders of bladder: Secondary | ICD-10-CM

## 2023-06-20 ENCOUNTER — Other Ambulatory Visit: Payer: Self-pay | Admitting: *Deleted

## 2023-06-20 DIAGNOSIS — N3289 Other specified disorders of bladder: Secondary | ICD-10-CM

## 2023-06-20 MED ORDER — MIRABEGRON ER 25 MG PO TB24
25.0000 mg | ORAL_TABLET | Freq: Every day | ORAL | 11 refills | Status: DC
Start: 2023-06-20 — End: 2023-06-21

## 2023-06-21 MED ORDER — MIRABEGRON ER 25 MG PO TB24
25.0000 mg | ORAL_TABLET | Freq: Every day | ORAL | 11 refills | Status: DC
Start: 2023-06-21 — End: 2024-07-02

## 2023-06-21 NOTE — Addendum Note (Signed)
Addended by: Consuella Lose on: 06/21/2023 11:41 AM   Modules accepted: Orders

## 2023-06-28 ENCOUNTER — Ambulatory Visit: Payer: Medicare Other | Admitting: Podiatry

## 2023-07-12 ENCOUNTER — Ambulatory Visit: Payer: Medicare Other | Attending: Physician Assistant | Admitting: Speech Pathology

## 2023-07-12 DIAGNOSIS — G250 Essential tremor: Secondary | ICD-10-CM | POA: Insufficient documentation

## 2023-07-12 DIAGNOSIS — R471 Dysarthria and anarthria: Secondary | ICD-10-CM | POA: Diagnosis present

## 2023-07-12 NOTE — Therapy (Unsigned)
OUTPATIENT SPEECH LANGUAGE PATHOLOGY  VOICE EVALUATION   Patient Name: Mike Holloway MRN: [id number] DOB:05-Nov-1940, 82 y.o., male Today's Date: 07/12/2023  PCP: [name], MD REFERRING PROVIDER: [name], PA   End of Session - 07/12/23 1534     Visit Number 1    Number of Visits 17    Date for SLP Re-Evaluation 09/06/23    Authorization Type Medicare A/Medicare B    Progress Note Due on Visit 10    SLP Start Time 1445    SLP Stop Time  1530    SLP Time Calculation (min) 45 min    Activity Tolerance Patient tolerated treatment well             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   SIADH (syndrome of inappropriate ADH production) (HCC)    Sleep apnea    Stroke [facility])    Past Surgical History:  Procedure Laterality Date   ABLATION     APPENDECTOMY     CATARACT EXTRACTION, BILATERAL     COLONOSCOPY WITH PROPOFOL N/A 03/17/2018   Procedure: COLONOSCOPY WITH PROPOFOL;  Surgeon: [name], MD;  Location: [facility] 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   Procedure: FECAL TRANSPLANT;  Surgeon: [name], MD;  Location: [facility] ENDOSCOPY;  Service: Gastroenterology;  Laterality: N/A;   HEMORRHOID SURGERY     HERNIA REPAIR     HIP FRACTURE SURGERY     INTRAMEDULLARY (IM) NAIL INTERTROCHANTERIC Right 09/16/2018   Procedure: INTRAMEDULLARY (IM) NAIL INTERTROCHANTRIC;  Surgeon: [name], MD;  Location: [facility] ORS;  Service: Orthopedics;  Laterality: Right;   IR CATHETER TUBE CHANGE  11/22/2018   NASAL SINUS SURGERY     ORCHIECTOMY     TONSILLECTOMY     Patient Active Problem List   Diagnosis Date Noted   DVT femoral (deep  venous thrombosis) with thrombophlebitis, left (HCC) 02/06/2020   Lymphedema 04/15/2019   Bilateral leg edema 01/25/2019   Hyponatremia 12/24/2018   SIADH (syndrome of inappropriate ADH production) (HCC) 12/24/2018   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) 10/27/2018   Generalized weakness 10/14/2018   Hip fracture (HCC) 09/15/2018   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   HLD (hyperlipidemia) 03/24/2018   Contusion of right knee 02/14/2018   Bradycardia 12/28/2017   Status post right unicompartmental knee replacement 11/03/2017   Tremor 09/04/2016   Chronic pain of right knee 08/10/2016   Right ankle pain 08/10/2016   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   Non-Hodgkin's lymphoma (HCC) 12/11/2015   Urinary retention 09/08/2015   Lymphoma, non-Hodgkin's (HCC) 04/03/2015   Breathlessness on exertion 11/21/2014   Breath shortness 11/21/2014   Arthropathy 11/07/2014   Atrial flutter, paroxysmal (HCC) 11/07/2014   Type 2 diabetes mellitus (HCC) 11/07/2014   Benign essential tremor 11/07/2014   Benign essential HTN 11/07/2014   Mixed incontinence 11/07/2014   Hypercholesterolemia without hypertriglyceridemia 11/07/2014   Apnea, sleep 11/07/2014   Controlled type 2 diabetes mellitus without complication (HCC) 11/07/2014   Pure hypercholesterolemia 11/07/2014  Other abnormalities of gait and mobility 11/02/2011   Decreased mobility 11/02/2011   Abnormal gait 06/29/2011   Discoordination 06/29/2011    ONSET DATE:  date of referral 07/08/2023; chronic diagnosis since 2017  REFERRING DIAG: G25.0 (ICD-10-CM) - Essential tremor  THERAPY DIAG:  Dysarthria and anarthria  Essential tremor  Rationale for Evaluation and Treatment Rehabilitation  SUBJECTIVE:   SUBJECTIVE STATEMENT: "In my head, my speech is loud enough" - he reports being at evaluation because  of "my doctor and her (pointing to his wife) Pt accompanied by: significant other  PERTINENT HISTORY: ***  DIAGNOSTIC FINDINGS: ***  PAIN:  Are you having pain? No   FALLS: Has patient fallen in last 6 months? 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 function: Impaired: Error awareness, Self-correction, and Comment: reasoning Behavior: decreased mental flexibility Functional deficits: see Neurocognitive Testing April 2024 -   SOCIAL HISTORY: Occupation: *** Water intake: {waterintake:27189} Caffeine/alcohol intake: {intakelevel:27191} Daily voice use: {intakelevel:27191} Environmental risks: {voiceenvironmentalrisks:27347} Occupational risks: {voiceoccupationalrisks:27348} Misuse: {voicemisuse:27345} Phonotraumatic behaviors: {phonotraumaticbehaviors:27346}  PERCEPTUAL VOICE ASSESSMENT: Voice quality: {VQL:27192} Vocal abuse: {VA:27193} Resonance: {resonance:27194} Respiratory function: {respbreathing:27195}  OBJECTIVE VOICE ASSESSMENT: {Voice Measurements:27784}  ORAL MOTOR EXAMINATION Facial : {OM Face:25663} Lingual: {OM Lingual:25664} Velum: {OM Velum:25665} Mandible: {OM mandible:25667} Cough: {OM Cough:25660} Voice: {OM Voice:25662}   PATIENT REPORTED OUTCOME MEASURES (PROM): {SLPPROM:27785}   TODAY'S TREATMENT:  ***   PATIENT EDUCATION: Education details: *** Person educated: {Person educated:25204} Education  method: {Education Method:25205} Education comprehension: {Education Comprehension:25206}   HOME EXERCISE PROGRAM: ***     GOALS: Goals reviewed with patient? {yes/no:20286}  SHORT TERM GOALS: Target date: 10 sessions  *** Baseline: Goal status: {GOALSTATUS:25110}  2.  *** Baseline:  Goal status: {GOALSTATUS:25110}  3.  *** Baseline:  Goal status: {GOALSTATUS:25110}  4.  *** Baseline:  Goal status: {GOALSTATUS:25110}  5.  *** Baseline:  Goal status: {GOALSTATUS:25110}  6.  *** Baseline:  Goal status: {GOALSTATUS:25110}  LONG TERM GOALS: Target date:   *** Baseline:  Goal status: {GOALSTATUS:25110}  2.  *** Baseline:  Goal status: {GOALSTATUS:25110}  3.  *** Baseline:  Goal status: {GOALSTATUS:25110}  4.  *** Baseline:  Goal status: {GOALSTATUS:25110}  5.  *** Baseline:  Goal status: {GOALSTATUS:25110}  6.  *** Baseline:  Goal status: {GOALSTATUS:25110}  ASSESSMENT:  CLINICAL IMPRESSION: Patient is a *** y.o. *** who was seen today for ***.   OBJECTIVE IMPAIRMENTS include {SLPOBJIMP:27107}. These impairments are limiting patient from {SLPLIMIT:27108}. Factors affecting potential to achieve goals and functional outcome are {SLP factors:25450}. Patient will benefit from skilled SLP services to address above impairments and improve overall function.  REHAB POTENTIAL: {rehabpotential:25112}  PLAN: SLP FREQUENCY: {rehab frequency:25116}  SLP DURATION: {rehab duration:25117}  PLANNED INTERVENTIONS: {SLP treatment/interventions:25449}    Pearse Shiffler B. Dreama Saa, M.S., CCC-SLP, Tree surgeon Certified Brain Injury Specialist [facility]  [facility] Rehabilitation Services Office [phone] Ascom [phone] Fax [phone]

## 2023-07-14 ENCOUNTER — Ambulatory Visit: Payer: Medicare Other | Admitting: Physician Assistant

## 2023-07-14 DIAGNOSIS — R339 Retention of urine, unspecified: Secondary | ICD-10-CM | POA: Diagnosis not present

## 2023-07-14 DIAGNOSIS — Z466 Encounter for fitting and adjustment of urinary device: Secondary | ICD-10-CM

## 2023-07-14 NOTE — Progress Notes (Signed)
Cath Change/ Replacement  Patient is present today for a catheter change due to urinary retention.  8ml of water was removed from the balloon, a 16FR coude foley cath was removed without difficulty.  Patient was cleaned and prepped in a sterile fashion with betadine and 2% lidocaine jelly was instilled into the urethra. A 16 FR coude foley cath was replaced into the bladder, no complications were noted. Urine return was noted 20ml and urine was yellow in color. The balloon was filled with 10ml of sterile water. A leg bag was attached for drainage.  Patient tolerated well.    Performed by: Carman Ching, PA-C   Follow up: Return in about 4 weeks (around 08/11/2023) for Catheter exchange.

## 2023-07-18 ENCOUNTER — Encounter: Payer: Medicare Other | Admitting: Speech Pathology

## 2023-07-19 ENCOUNTER — Ambulatory Visit: Payer: Medicare Other | Admitting: Speech Pathology

## 2023-07-19 DIAGNOSIS — R471 Dysarthria and anarthria: Secondary | ICD-10-CM

## 2023-07-19 DIAGNOSIS — G250 Essential tremor: Secondary | ICD-10-CM

## 2023-07-19 NOTE — Therapy (Signed)
OUTPATIENT SPEECH LANGUAGE PATHOLOGY  VOICE TREATMENT    Patient Name: Mike Holloway MRN: [id number] DOB:1941/09/11, 82 y.o., male Today's Date: 07/19/2023  PCP: [name], MD REFERRING PROVIDER: [name], PA   End of Session - 07/19/23 1443     Visit Number 2    Number of Visits 17    Date for SLP Re-Evaluation 09/06/23    Authorization Type Medicare A/Medicare B    Progress Note Due on Visit 10    SLP Start Time 1445    SLP Stop Time  1530    SLP Time Calculation (min) 45 min    Activity Tolerance Patient tolerated treatment well             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   SIADH (syndrome of inappropriate ADH production) (HCC)    Sleep apnea    Stroke [facility])    Past Surgical History:  Procedure Laterality Date   ABLATION     APPENDECTOMY     CATARACT EXTRACTION, BILATERAL     COLONOSCOPY WITH PROPOFOL N/A 03/17/2018   Procedure: COLONOSCOPY WITH PROPOFOL;  Surgeon: [name], MD;  Location: [facility] 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   Procedure: FECAL TRANSPLANT;  Surgeon: [name], MD;  Location: [facility] ENDOSCOPY;  Service: Gastroenterology;  Laterality: N/A;   HEMORRHOID SURGERY     HERNIA REPAIR     HIP FRACTURE SURGERY     INTRAMEDULLARY (IM) NAIL INTERTROCHANTERIC Right 09/16/2018   Procedure: INTRAMEDULLARY (IM) NAIL INTERTROCHANTRIC;  Surgeon: [name], MD;  Location: [facility] ORS;  Service: Orthopedics;  Laterality: Right;   IR CATHETER TUBE CHANGE  11/22/2018   NASAL SINUS SURGERY     ORCHIECTOMY     TONSILLECTOMY     Patient Active Problem List   Diagnosis Date Noted   DVT femoral (deep  venous thrombosis) with thrombophlebitis, left (HCC) 02/06/2020   Lymphedema 04/15/2019   Bilateral leg edema 01/25/2019   Hyponatremia 12/24/2018   SIADH (syndrome of inappropriate ADH production) (HCC) 12/24/2018   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) 10/27/2018   Generalized weakness 10/14/2018   Hip fracture (HCC) 09/15/2018   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   Recurrent Clostridium difficile diarrhea 03/24/2018   HLD (hyperlipidemia) 03/24/2018   Contusion of right knee 02/14/2018   Bradycardia 12/28/2017   Status post right unicompartmental knee replacement 11/03/2017   Tremor 09/04/2016   Chronic pain of right knee 08/10/2016   Right ankle pain 08/10/2016   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   Non-Hodgkin's lymphoma (HCC) 12/11/2015   Urinary retention 09/08/2015   Lymphoma, non-Hodgkin's (HCC) 04/03/2015   Breathlessness on exertion 11/21/2014   Breath shortness 11/21/2014   Arthropathy 11/07/2014   Atrial flutter, paroxysmal (HCC) 11/07/2014   Type 2 diabetes mellitus (HCC) 11/07/2014   Benign essential tremor 11/07/2014   Benign essential HTN 11/07/2014   Mixed incontinence 11/07/2014   Hypercholesterolemia without hypertriglyceridemia 11/07/2014   Apnea, sleep 11/07/2014   Controlled type 2 diabetes mellitus without complication (HCC) 11/07/2014   Pure hypercholesterolemia 11/07/2014  Other abnormalities of gait and mobility 11/02/2011   Decreased mobility 11/02/2011   Abnormal gait 06/29/2011   Discoordination 06/29/2011    ONSET DATE:  date of referral 07/08/2023; chronic diagnosis since 2017  REFERRING DIAG: G25.0 (ICD-10-CM) - Essential tremor  THERAPY DIAG:  Dysarthria and anarthria  Essential tremor  Rationale for Evaluation and Treatment Rehabilitation  SUBJECTIVE:   PERTINENT HISTORY and DIAGNOSTIC TESTING: Pt is an 82 year old male with long standing history of  essential tremor, 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 DBS and thalamotomy both provided some improvement in his tremor, though he continues to struggle with activities due to his action and postural tremor. 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 MBS 07/05/2018: "Patient presents with mild-moderate oropharyngeal dysphagia.". MBS 05/04/22: "Patient presents with mild-moderate oropharyngeal dysphagia consistent with prior objective testing.". MBS 03/15/2023 "Pt presents with what appears to be mild-moderate, chronic oropharyngeal dysphagia with contributing factors of sensorimotor deficits, mild cognitive deficits(per Neurology/Wife), weakness, and potential age-related changes."   PAIN:  Are you having pain? No   FALLS: Has patient fallen in last 6 months? No  LIVING ENVIRONMENT: Lives with: lives with their spouse Lives in: House/apartment  PLOF: Requires assistive device for independence, Needs assistance with ADLs, Needs assistance with gait, and Needs assistance with transfers  PATIENT GOALS    pt's wife would like to improve pt's word finding, speech intelligibility and improve caregiver education for pt support  SUBJECTIVE STATEMENT: "He has been very good about using his words"  they brought in the The breather (respiratory strengthening device) Pt accompanied by: significant other  OBJECTIVE:   PATIENT REPORTED OUTCOME MEASURES (PROM): To be completed within next 3 sessions   TODAY'S TREATMENT:  Skilled treatment session focused on pt's dysarthria goals. SLP facilitated session by providing the following interventions:  Respiratory Strengthening for improved phonation - currently pt's  Breather set to level 2 - at this setting, pt found 2 sets of 10 repetitions to be easy, therefore Breather was increased to 4 for inhalation and exhalation. Pt found 2 sets of 10 repetitions to be appropriately difficult. Pt able to perform task with appropriate sequence and rate.     WARM-UP   (using "May---Me---My---Moe---Moo") Smooth Connected Speech- Min A Every syllable with INTENT-  Min A Typically produced at 85-90 dB- Min A to achieve 81 dB   AH Mouth opened wide- Min A Duration (not more than 10 seconds)-  Moderate A to achieve 4.8 seconds Volume consistent throughout- Moderate A Clear vocal quality throughout- Moderate A Maximal A Typically produced at 85-90 dB- Moderate A to achieve 82 dB   GLIDES Mouth opened wide- Moderate A Maximal A Steady "ah," then glide up- Moderate A to achieve 160 Hz Steady "ah," then glide down-  Moderate A to achieve 111 Hz Clear vocal quality throughout- Moderate A Maximal A  Typically produced at 85-90dB- Moderate A to achieve 78 dB   COUNTING  Smooth and connected- Moderate A  Neither mono-pitch nor chanted- Moderate A  Projected up and forward- Min A  Every number exaggerated- Min A   Typically produced at 80-85 dB-  Min A  to achieve 83 dB   READING Be deliberate- Min A  Projected up and forward- Moderate A  Every  single word with INTENT- Min A  Typically produced at 75-85 dB- Min A  to achieve 82 dB   CONVERSATION  Pt with no carry over of increased vocal or over articulation into off-the-cuff- comments, pt reluctant to repeat comments at increased volume so he didn't despite cuing from SLP        PATIENT EDUCATION: Education details: worsening dysarthria Person educated: Patient and Spouse Education method: Explanation Education comprehension: needs further education   HOME EXERCISE PROGRAM: Complete exercises using The Breather Complete AH, glides counting and reading phrases     GOALS: Goals reviewed with  patient? Yes  SHORT TERM GOALS: Target date: 10 sessions  To determine optimal resistance levels for Respiratory Muscle Training (RMT) for improving increase hyolaryngeal elevation and strengthen cough for airway clearance, patient will participate in evaluation (and re-assessment as needed) of maximum expiratory pressure (MEP). Baseline: Goal status: INITIAL  2.  With Min A, patient will improve speech intelligibility for  sentence by controlling rate of speech, over-articulation, and increased loudness to achieve 90% intelligibility.  Baseline:  Goal status: INITIAL  LONG TERM GOALS: Target date: 09/06/2023  With minimal assistance, pt will utilize speech intelligibility strategies to achieve ~ 75% intelligibility with common functional phrases.  Baseline:  Goal status: INITIAL  2.  With Rare Min A, patient will demo HEP for motor speech accurately in 5/7 opportunities.  Baseline:  Goal status: INITIAL  ASSESSMENT:  CLINICAL IMPRESSION: Patient is a 82 y.o. male who was seen today for a voice evaluation. 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 His moderate dysarthria is c/b by hypophonia, breathiness, decreased articulatory precision  (specifically during the production of stop consonants), [place]-sa resonance disorder, short utterance length, habitual low pitch and dysprosody. As a result, his speech intelligibility is reduced to ~ 75% at the sentence level. Pt continues with decreased awareness and recall of speech intelligibility strategies as well as reluctance to continue HEP assigned from previous course  of Outpatient ST. Short course of skilled ST will be initiated but discontinued if HEP or progress isn't achieved.   While pt was eager to perform tasks within the session and he performed them well, he was not receptive to improving spontaneous conversation.    OBJECTIVE IMPAIRMENTS include dysarthria and voice disorder. These impairments are limiting patient from effectively communicating at home and in community. Factors affecting potential to achieve goals and functional outcome are ability to learn/carryover information, co-morbidities, cooperation/participation level, medical prognosis, previous level of function, and severity of impairments. Patient will benefit from skilled SLP services to address above impairments and improve overall function.  REHAB POTENTIAL: Fair pre-existing MCI, previous courses of ST, noncompliance with HEP  PLAN: SLP FREQUENCY: 1-2x/week  SLP DURATION: 8 weeks  PLANNED INTERVENTIONS: 92507 Treatment of speech (30 or 45 min) , Language facilitation, SLP instruction and feedback, Compensatory strategies, and Patient/family education    Alta Shober B. Dreama Saa, M.S., CCC-SLP, Tree surgeon Certified Brain Injury Specialist [facility]  [facility] Rehabilitation Services Office [phone] Ascom [phone] Fax [phone]

## 2023-07-20 ENCOUNTER — Encounter: Payer: Medicare Other | Admitting: Speech Pathology

## 2023-07-21 ENCOUNTER — Encounter: Payer: Medicare Other | Admitting: Speech Pathology

## 2023-07-25 ENCOUNTER — Ambulatory Visit: Payer: Medicare Other | Admitting: Speech Pathology

## 2023-07-25 DIAGNOSIS — R471 Dysarthria and anarthria: Secondary | ICD-10-CM

## 2023-07-25 DIAGNOSIS — G250 Essential tremor: Secondary | ICD-10-CM

## 2023-07-25 NOTE — Therapy (Signed)
OUTPATIENT SPEECH LANGUAGE PATHOLOGY  VOICE TREATMENT    Patient Name: Mike Holloway MRN: [id number] DOB:07-27-1941, 82 y.o., male Today's Date: 07/25/2023  PCP: [name], MD REFERRING PROVIDER: [name], PA   End of Session - 07/25/23 1352     Visit Number 3    Number of Visits 17    Date for SLP Re-Evaluation 09/06/23    Authorization Type Medicare A/Medicare B    Progress Note Due on Visit 10    SLP Start Time 1400    SLP Stop Time  1445    SLP Time Calculation (min) 45 min    Activity Tolerance Patient tolerated treatment well             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   SIADH (syndrome of inappropriate ADH production) (HCC)    Sleep apnea    Stroke [facility])    Past Surgical History:  Procedure Laterality Date   ABLATION     APPENDECTOMY     CATARACT EXTRACTION, BILATERAL     COLONOSCOPY WITH PROPOFOL N/A 03/17/2018   Procedure: COLONOSCOPY WITH PROPOFOL;  Surgeon: [name], MD;  Location: [facility] 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   Procedure: FECAL TRANSPLANT;  Surgeon: [name], MD;  Location: [facility] ENDOSCOPY;  Service: Gastroenterology;  Laterality: N/A;   HEMORRHOID SURGERY     HERNIA REPAIR     HIP FRACTURE SURGERY     INTRAMEDULLARY (IM) NAIL INTERTROCHANTERIC Right 09/16/2018   Procedure: INTRAMEDULLARY (IM) NAIL INTERTROCHANTRIC;  Surgeon: [name], MD;  Location: [facility] ORS;  Service: Orthopedics;  Laterality: Right;   IR CATHETER TUBE CHANGE  11/22/2018   NASAL SINUS SURGERY     ORCHIECTOMY     TONSILLECTOMY     Patient Active Problem List   Diagnosis Date Noted   DVT femoral (deep  venous thrombosis) with thrombophlebitis, left (HCC) 02/06/2020   Lymphedema 04/15/2019   Bilateral leg edema 01/25/2019   Hyponatremia 12/24/2018   SIADH (syndrome of inappropriate ADH production) (HCC) 12/24/2018   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) 10/27/2018   Generalized weakness 10/14/2018   Hip fracture (HCC) 09/15/2018   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   HLD (hyperlipidemia) 03/24/2018   Contusion of right knee 02/14/2018   Bradycardia 12/28/2017   Status post right unicompartmental knee replacement 11/03/2017   Tremor 09/04/2016   Chronic pain of right knee 08/10/2016   Right ankle pain 08/10/2016   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   Non-Hodgkin's lymphoma (HCC) 12/11/2015   Urinary retention 09/08/2015   Lymphoma, non-Hodgkin's (HCC) 04/03/2015   Breathlessness on exertion 11/21/2014   Breath shortness 11/21/2014   Arthropathy 11/07/2014   Atrial flutter, paroxysmal (HCC) 11/07/2014   Type 2 diabetes mellitus (HCC) 11/07/2014   Benign essential tremor 11/07/2014   Benign essential HTN 11/07/2014   Mixed incontinence 11/07/2014   Hypercholesterolemia without hypertriglyceridemia 11/07/2014   Apnea, sleep 11/07/2014   Controlled type 2 diabetes mellitus without complication (HCC) 11/07/2014   Pure hypercholesterolemia 11/07/2014  Other abnormalities of gait and mobility 11/02/2011   Decreased mobility 11/02/2011   Abnormal gait 06/29/2011   Discoordination 06/29/2011    ONSET DATE:  date of referral 07/08/2023; chronic diagnosis since 2017  REFERRING DIAG: G25.0 (ICD-10-CM) - Essential tremor  THERAPY DIAG:  Dysarthria and anarthria  Essential tremor  Rationale for Evaluation and Treatment Rehabilitation  SUBJECTIVE:   PERTINENT HISTORY and DIAGNOSTIC TESTING: Pt is an 82 year old male with long standing history of  essential tremor, 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 DBS and thalamotomy both provided some improvement in his tremor, though he continues to struggle with activities due to his action and postural tremor. 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 MBS 07/05/2018: "Patient presents with mild-moderate oropharyngeal dysphagia.". MBS 05/04/22: "Patient presents with mild-moderate oropharyngeal dysphagia consistent with prior objective testing.". MBS 03/15/2023 "Pt presents with what appears to be mild-moderate, chronic oropharyngeal dysphagia with contributing factors of sensorimotor deficits, mild cognitive deficits(per Neurology/Wife), weakness, and potential age-related changes."   PAIN:  Are you having pain? No   FALLS: Has patient fallen in last 6 months? No  LIVING ENVIRONMENT: Lives with: lives with their spouse Lives in: House/apartment  PLOF: Requires assistive device for independence, Needs assistance with ADLs, Needs assistance with gait, and Needs assistance with transfers  PATIENT GOALS    pt's wife would like to improve pt's word finding, speech intelligibility and improve caregiver education for pt support  SUBJECTIVE STATEMENT: "He has been very good about using his words"  they brought in the The breather (respiratory strengthening device) Pt accompanied by: significant other  OBJECTIVE:   PATIENT REPORTED OUTCOME MEASURES (PROM): To be completed within next 3 sessions   TODAY'S TREATMENT:  Skilled treatment session focused on pt's dysarthria goals. SLP facilitated session by providing the following interventions:  Respiratory Strengthening for improved phonation - currently pt's  Breather set to level 3 - pt's wife states they had to reduce from 4 to 3 and they are performing going thru the list and then repeating instead of doing 2 sets consecutively - Breather was increased to 4 for inhalation and exhalation for 1 set of 10 and then reduced to 3 for 2 sets of 10 repetitions.    AH Mouth opened wide- Min A Duration (not more than 10 seconds)-  Maximal A to achieve 4.8 seconds Volume consistent throughout- Moderate A Clear vocal quality throughout- Moderate A Maximal A Typically produced at 85-90 dB- Moderate A to achieve 82 dB   GLIDES -    COUNTING  Smooth and connected- Moderate A  Neither mono-pitch nor chanted- Moderate A  Projected up and forward- Min A  Every number exaggerated- Min A   Typically produced at 80-85 dB-  Min A  to achieve 83 dB   READING Be deliberate- Min A  Projected up and forward- Moderate A  Every single word with INTENT- Min A  Typically produced at 75-85 dB- Min A  to achieve 82 dB   CONVERSATION  Pt with no carry over of increased vocal or over articulation into off-the-cuff- comments, pt reluctant to repeat comments at increased volume so he didn't despite cuing from SLP        PATIENT EDUCATION: Education details: worsening dysarthria Person educated: Patient  and Spouse Education method: Explanation Education comprehension: needs further education   HOME EXERCISE PROGRAM: Complete exercises using The Breather Complete AH, glides counting and reading phrases     GOALS: Goals reviewed with patient? Yes  SHORT TERM GOALS: Target date: 10 sessions  To determine optimal resistance levels for Respiratory Muscle Training (RMT) for improving increase hyolaryngeal elevation and strengthen cough for airway clearance, patient will participate in evaluation (and re-assessment as needed) of maximum expiratory pressure (MEP). Baseline: Goal status: INITIAL  2.  With Min A, patient will improve speech intelligibility for   sentence by controlling rate of speech, over-articulation, and increased loudness to achieve 90% intelligibility.  Baseline:  Goal status: INITIAL  LONG TERM GOALS: Target date: 09/06/2023  With minimal assistance, pt will utilize speech intelligibility strategies to achieve ~ 75% intelligibility with common functional phrases.  Baseline:  Goal status: INITIAL  2.  With Rare Min A, patient will demo HEP for motor speech accurately in 5/7 opportunities.  Baseline:  Goal status: INITIAL  ASSESSMENT:  CLINICAL IMPRESSION: Patient is a 82 y.o. male who was seen today for a voice evaluation. 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 His moderate dysarthria is c/b by hypophonia, breathiness, decreased articulatory precision  (specifically during the production of stop consonants), cul-de-sa resonance disorder, short utterance length, habitual low pitch and dysprosody. As a result, his speech intelligibility is reduced to ~ 75% at the sentence level. Pt continues with decreased awareness and recall of speech intelligibility strategies as well as reluctance to continue HEP assigned from previous course of Outpatient ST. Short course of skilled ST will be initiated but discontinued if HEP or progress isn't achieved.   While pt was eager to perform tasks within the session and he performed them well, he was not receptive to improving spontaneous conversation.    OBJECTIVE IMPAIRMENTS include dysarthria and voice disorder. These impairments are limiting patient from effectively communicating at home and in community. Factors  affecting potential to achieve goals and functional outcome are ability to learn/carryover information, co-morbidities, cooperation/participation level, medical prognosis, previous level of function, and severity of impairments. Patient will benefit from skilled SLP services to address above impairments and improve overall function.  REHAB POTENTIAL: Fair pre-existing MCI, previous courses of ST, noncompliance with HEP  PLAN: SLP FREQUENCY: 1-2x/week  SLP DURATION: 8 weeks  PLANNED INTERVENTIONS: 92507 Treatment of speech (30 or 45 min) , Language facilitation, SLP instruction and feedback, Compensatory strategies, and Patient/family education    Telitha Plath B. Dreama Saa, M.S., CCC-SLP, Tree surgeon Certified Brain Injury Specialist [facility]  [facility] Rehabilitation Services Office [phone] Ascom [phone] Fax [phone]

## 2023-07-27 ENCOUNTER — Encounter: Payer: Medicare Other | Admitting: Speech Pathology

## 2023-07-28 ENCOUNTER — Ambulatory Visit: Payer: Medicare Other | Admitting: Speech Pathology

## 2023-07-28 DIAGNOSIS — G250 Essential tremor: Secondary | ICD-10-CM

## 2023-07-28 DIAGNOSIS — R471 Dysarthria and anarthria: Secondary | ICD-10-CM | POA: Diagnosis not present

## 2023-07-28 NOTE — Therapy (Signed)
OUTPATIENT SPEECH LANGUAGE PATHOLOGY  VOICE TREATMENT    Patient Name: Mike Holloway MRN: [id number] DOB:[birth year], 82 y.o., male Today's Date: 07/28/2023  PCP: [name], MD REFERRING PROVIDER: [name], PA   End of Session - 07/28/23 1350     Visit Number 4    Number of Visits 17    Date for SLP Re-Evaluation 09/06/23    Authorization Type Medicare A/Medicare B    Progress Note Due on Visit 10    SLP Start Time 1345    SLP Stop Time  1430    SLP Time Calculation (min) 45 min    Activity Tolerance Patient tolerated treatment well             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   SIADH (syndrome of inappropriate ADH production) (HCC)    Sleep apnea    Stroke [facility])    Past Surgical History:  Procedure Laterality Date   ABLATION     APPENDECTOMY     CATARACT EXTRACTION, BILATERAL     COLONOSCOPY WITH PROPOFOL N/A 03/17/2018   Procedure: COLONOSCOPY WITH PROPOFOL;  Surgeon: [name], MD;  Location: [facility] 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   Procedure: FECAL TRANSPLANT;  Surgeon: [name], MD;  Location: [facility] ENDOSCOPY;  Service: Gastroenterology;  Laterality: N/A;   HEMORRHOID SURGERY     HERNIA REPAIR     HIP FRACTURE SURGERY     INTRAMEDULLARY (IM) NAIL INTERTROCHANTERIC Right 09/16/2018   Procedure: INTRAMEDULLARY (IM) NAIL INTERTROCHANTRIC;  Surgeon: [name], MD;  Location: [facility] ORS;  Service: Orthopedics;  Laterality: Right;   IR CATHETER TUBE CHANGE  11/22/2018   NASAL SINUS SURGERY     ORCHIECTOMY     TONSILLECTOMY     Patient Active Problem List   Diagnosis Date Noted   DVT femoral (deep  venous thrombosis) with thrombophlebitis, left (HCC) 02/06/2020   Lymphedema 04/15/2019   Bilateral leg edema 01/25/2019   Hyponatremia 12/24/2018   SIADH (syndrome of inappropriate ADH production) (HCC) 12/24/2018   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) 10/27/2018   Generalized weakness 10/14/2018   Hip fracture (HCC) 09/15/2018   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   HLD (hyperlipidemia) 03/24/2018   Contusion of right knee 02/14/2018   Bradycardia 12/28/2017   Status post right unicompartmental knee replacement 11/03/2017   Tremor 09/04/2016   Chronic pain of right knee 08/10/2016   Right ankle pain 08/10/2016   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   Non-Hodgkin's lymphoma (HCC) 12/11/2015   Urinary retention 09/08/2015   Lymphoma, non-Hodgkin's (HCC) 04/03/2015   Breathlessness on exertion 11/21/2014   Breath shortness 11/21/2014   Arthropathy 11/07/2014   Atrial flutter, paroxysmal (HCC) 11/07/2014   Type 2 diabetes mellitus (HCC) 11/07/2014   Benign essential tremor 11/07/2014   Benign essential HTN 11/07/2014   Mixed incontinence 11/07/2014   Hypercholesterolemia without hypertriglyceridemia 11/07/2014   Apnea, sleep 11/07/2014   Controlled type 2 diabetes mellitus without complication (HCC) 11/07/2014   Pure hypercholesterolemia 11/07/2014  Other abnormalities of gait and mobility 11/02/2011   Decreased mobility 11/02/2011   Abnormal gait 06/29/2011   Discoordination 06/29/2011    ONSET DATE:  date of referral 07/08/2023; chronic diagnosis since 2017  REFERRING DIAG: G25.0 (ICD-10-CM) - Essential tremor  THERAPY DIAG:  Dysarthria and anarthria  Essential tremor  Rationale for Evaluation and Treatment Rehabilitation  SUBJECTIVE:   PERTINENT HISTORY and DIAGNOSTIC TESTING: Pt is an 82 year old male with long standing history of  essential tremor, 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 DBS and thalamotomy both provided some improvement in his tremor, though he continues to struggle with activities due to his action and postural tremor. 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 MBS 07/05/2018: "Patient presents with mild-moderate oropharyngeal dysphagia.". MBS 05/04/22: "Patient presents with mild-moderate oropharyngeal dysphagia consistent with prior objective testing.". MBS 03/15/2023 "Pt presents with what appears to be mild-moderate, chronic oropharyngeal dysphagia with contributing factors of sensorimotor deficits, mild cognitive deficits(per Neurology/Wife), weakness, and potential age-related changes."   PAIN:  Are you having pain? No   FALLS: Has patient fallen in last 6 months? No  LIVING ENVIRONMENT: Lives with: lives with their spouse Lives in: House/apartment  PLOF: Requires assistive device for independence, Needs assistance with ADLs, Needs assistance with gait, and Needs assistance with transfers  PATIENT GOALS    pt's wife would like to improve pt's word finding, speech intelligibility and improve caregiver education for pt support  SUBJECTIVE STATEMENT: Pt presents with continued decreased recall of HEP (written directions provided) Pt accompanied by: significant other  OBJECTIVE:   TODAY'S TREATMENT:  Skilled treatment session focused on pt's dysarthria goals. SLP facilitated session by providing the following interventions:  Respiratory Strengthening for improved phonation - currently pt's Breather set to level 3 -  3 sets of 10 repetitions     AH Mouth opened wide- Min A Duration (not more than 10 seconds)-   Maximal A to achieve 5.2 seconds Volume consistent throughout- Maximal A Clear vocal quality throughout- Moderate A Maximal A Typically produced at 85-90 dB- Maximal A to achieve 82 dB   GLIDES -    Mouth opened wide- Min A Clear vocal quality throughout- Max A Typically produced at 85-90dB- Max A to achieve 83 dB     COUNTING  Smooth and connected- Moderate A  Neither mono-pitch nor chanted- Moderate A  Projected up and forward- Maximal A  Every number exaggerated- Min A   Typically produced at 80-85 dB-  Maximal A  to achieve 84 dB   READING Be deliberate- Min A  Projected up and forward- Moderate A  Every single word with INTENT- Moderate A  Typically produced at 75-85 dB- Moderate A  to achieve 82 dB   CONVERSATION  Better vocal intensity with cues during carryover comments during today's session    PATIENT EDUCATION: Education details: see above Person educated: Patient and Spouse Education method: Explanation Education comprehension: needs further education   HOME EXERCISE PROGRAM: Complete exercises using The Breather Complete AH, glides counting and reading phrases     GOALS: Goals reviewed with patient? Yes  SHORT TERM GOALS: Target date: 10 sessions  To determine optimal resistance levels for Respiratory Muscle Training (RMT) for improving increase hyolaryngeal elevation and strengthen cough for airway  clearance, patient will participate in evaluation (and re-assessment as needed) of maximum expiratory pressure (MEP). Baseline: Goal status: INITIAL  2.  With Min A, patient will improve speech intelligibility for  sentence by controlling rate of speech, over-articulation, and increased loudness to achieve 90% intelligibility.  Baseline:  Goal status: INITIAL  LONG TERM GOALS: Target date: 09/06/2023  With minimal assistance, pt will utilize speech intelligibility strategies to achieve ~ 75% intelligibility with common functional phrases.  Baseline:   Goal status: INITIAL  2.  With Rare Min A, patient will demo HEP for motor speech accurately in 5/7 opportunities.  Baseline:  Goal status: INITIAL  ASSESSMENT:  CLINICAL IMPRESSION: Patient is a 82 y.o. male who was seen today for a voice evaluation. 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 His moderate dysarthria is c/b by hypophonia, breathiness, decreased articulatory precision  (specifically during the production of stop consonants), cul-de-sa resonance disorder, short utterance length, habitual low pitch and dysprosody. As a result, his speech intelligibility is reduced to ~ 75% at the sentence level. Pt continues with decreased awareness and recall of speech intelligibility strategies as well as reluctance to continue HEP assigned from previous course of Outpatient ST. Short course of skilled ST will be initiated but discontinued if HEP or progress isn't achieved.   Improvement noted in today's session. See the above treatment note for details.    OBJECTIVE IMPAIRMENTS include dysarthria and voice disorder. These impairments are limiting patient from effectively communicating at home and in community. Factors affecting potential to achieve goals and functional outcome are ability to learn/carryover information, co-morbidities, cooperation/participation level, medical prognosis, previous level of function, and severity of impairments. Patient will benefit from skilled SLP services to address above impairments and improve overall function.  REHAB POTENTIAL: Fair pre-existing MCI, previous courses of ST,  noncompliance with HEP  PLAN: SLP FREQUENCY: 1-2x/week  SLP DURATION: 8 weeks  PLANNED INTERVENTIONS: 92507 Treatment of speech (30 or 45 min) , Language facilitation, SLP instruction and feedback, Compensatory strategies, and Patient/family education    Lonni Dirden B. Dreama Saa, M.S., CCC-SLP, Tree surgeon Certified Brain Injury Specialist [facility]  [facility] Rehabilitation Services Office [phone] Ascom [phone] Fax [phone]

## 2023-08-01 ENCOUNTER — Ambulatory Visit: Payer: Medicare Other | Attending: Physician Assistant | Admitting: Speech Pathology

## 2023-08-01 DIAGNOSIS — R471 Dysarthria and anarthria: Secondary | ICD-10-CM | POA: Insufficient documentation

## 2023-08-01 DIAGNOSIS — G250 Essential tremor: Secondary | ICD-10-CM | POA: Insufficient documentation

## 2023-08-01 NOTE — Therapy (Unsigned)
OUTPATIENT SPEECH LANGUAGE PATHOLOGY  VOICE TREATMENT    Patient Name: Mike Holloway MRN: [id number] DOB:Dec 02, 1940, 82 y.o., male Today's Date: 08/01/2023  PCP: [name], MD REFERRING PROVIDER: [name], PA   End of Session - 08/01/23 1418     Visit Number 5    Number of Visits 17    Date for SLP Re-Evaluation 09/06/23    Authorization Type Medicare A/Medicare B    Progress Note Due on Visit 10    SLP Start Time 1400    SLP Stop Time  1445    SLP Time Calculation (min) 45 min    Activity Tolerance Patient tolerated treatment well             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   SIADH (syndrome of inappropriate ADH production) (HCC)    Sleep apnea    Stroke [facility])    Past Surgical History:  Procedure Laterality Date   ABLATION     APPENDECTOMY     CATARACT EXTRACTION, BILATERAL     COLONOSCOPY WITH PROPOFOL N/A 03/17/2018   Procedure: COLONOSCOPY WITH PROPOFOL;  Surgeon: [name], MD;  Location: [facility] 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   Procedure: FECAL TRANSPLANT;  Surgeon: [name], MD;  Location: [facility] ENDOSCOPY;  Service: Gastroenterology;  Laterality: N/A;   HEMORRHOID SURGERY     HERNIA REPAIR     HIP FRACTURE SURGERY     INTRAMEDULLARY (IM) NAIL INTERTROCHANTERIC Right 09/16/2018   Procedure: INTRAMEDULLARY (IM) NAIL INTERTROCHANTRIC;  Surgeon: [name], MD;  Location: [facility] ORS;  Service: Orthopedics;  Laterality: Right;   IR CATHETER TUBE CHANGE  11/22/2018   NASAL SINUS SURGERY     ORCHIECTOMY     TONSILLECTOMY     Patient Active Problem List   Diagnosis Date Noted   DVT femoral (deep  venous thrombosis) with thrombophlebitis, left (HCC) 02/06/2020   Lymphedema 04/15/2019   Bilateral leg edema 01/25/2019   Hyponatremia 12/24/2018   SIADH (syndrome of inappropriate ADH production) (HCC) 12/24/2018   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) 10/27/2018   Generalized weakness 10/14/2018   Hip fracture (HCC) 09/15/2018   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   HLD (hyperlipidemia) 03/24/2018   Contusion of right knee 02/14/2018   Bradycardia 12/28/2017   Status post right unicompartmental knee replacement 11/03/2017   Tremor 09/04/2016   Chronic pain of right knee 08/10/2016   Right ankle pain 08/10/2016   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   Non-Hodgkin's lymphoma (HCC) 12/11/2015   Urinary retention 09/08/2015   Lymphoma, non-Hodgkin's (HCC) 04/03/2015   Breathlessness on exertion 11/21/2014   Breath shortness 11/21/2014   Arthropathy 11/07/2014   Atrial flutter, paroxysmal (HCC) 11/07/2014   Type 2 diabetes mellitus (HCC) 11/07/2014   Benign essential tremor 11/07/2014   Benign essential HTN 11/07/2014   Mixed incontinence 11/07/2014   Hypercholesterolemia without hypertriglyceridemia 11/07/2014   Apnea, sleep 11/07/2014   Controlled type 2 diabetes mellitus without complication (HCC) 11/07/2014   Pure hypercholesterolemia 11/07/2014  Other abnormalities of gait and mobility 11/02/2011   Decreased mobility 11/02/2011   Abnormal gait 06/29/2011   Discoordination 06/29/2011    ONSET DATE:  date of referral 07/08/2023; chronic diagnosis since 2017  REFERRING DIAG: G25.0 (ICD-10-CM) - Essential tremor  THERAPY DIAG:  Dysarthria and anarthria  Essential tremor  Rationale for Evaluation and Treatment Rehabilitation  SUBJECTIVE:   PERTINENT HISTORY and DIAGNOSTIC TESTING: Pt is an 82 year old male with long standing history of  essential tremor, 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 DBS and thalamotomy both provided some improvement in his tremor, though he continues to struggle with activities due to his action and postural tremor. 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 MBS 07/05/2018: "Patient presents with mild-moderate oropharyngeal dysphagia.". MBS 05/04/22: "Patient presents with mild-moderate oropharyngeal dysphagia consistent with prior objective testing.". MBS 03/15/2023 "Pt presents with what appears to be mild-moderate, chronic oropharyngeal dysphagia with contributing factors of sensorimotor deficits, mild cognitive deficits(per Neurology/Wife), weakness, and potential age-related changes."   PAIN:  Are you having pain? No   FALLS: Has patient fallen in last 6 months? No  LIVING ENVIRONMENT: Lives with: lives with their spouse Lives in: House/apartment  PLOF: Requires assistive device for independence, Needs assistance with ADLs, Needs assistance with gait, and Needs assistance with transfers  PATIENT GOALS    pt's wife would like to improve pt's word finding, speech intelligibility and improve caregiver education for pt support  SUBJECTIVE STATEMENT: Pt's wife reports that pt is getting a headache when performing the respiratory training and loud ah Pt accompanied by: significant other  OBJECTIVE:   TODAY'S TREATMENT:  Skilled treatment session focused on pt's dysarthria goals. SLP facilitated session by providing the following interventions:  Pt and his wife report that he experiences increased headache when performing the breathing and Loud AH - monitored throughout the session and exercises changed  appropriately see below  Respiratory Strengthening for improved phonation - currently pt's Breather set to level 3 for exhalation and increased to 4 for inhalation-  3 sets of 6 repetitions     AH Mouth opened wide- Min A Duration (not more than 10 seconds)-  Maximal A to achieve 5.2 seconds Volume consistent throughout- Maximal A Clear vocal quality throughout- Min A Typically produced at 85-90 dB- Min A to achieve 82 dB   GLIDES -    Mouth opened wide- Min A Clear vocal quality throughout- Mod I Typically produced at 85-90dB- Mod I to achieve 83 dB     COUNTING  Smooth and connected- Min A  Neither mono-pitch nor chanted- Min A  Projected up and forward- Min A  Every number exaggerated- Min A   Typically produced at 80-85 dB-  Min A  to achieve 84 dB   READING - created list of functional phrases related to personal phrases Be deliberate- Min A  Projected up and forward- Min A  Every single word with INTENT- Min A  Typically produced at 75-85 dB- Min A  to achieve 82 dB   CONVERSATION  Better vocal intensity with cues during carryover comments during today's session    PATIENT EDUCATION: Education details: see above Person educated: Patient and Spouse Education method: Explanation Education comprehension: needs further education   HOME EXERCISE PROGRAM: Complete  exercises using The Breather Complete AH, glides counting and reading phrases Read list of personalized functional phrases     GOALS: Goals reviewed with patient? Yes  SHORT TERM GOALS: Target date: 10 sessions  To determine optimal resistance levels for Respiratory Muscle Training (RMT) for improving increase hyolaryngeal elevation and strengthen cough for airway clearance, patient will participate in evaluation (and re-assessment as needed) of maximum expiratory pressure (MEP). Baseline: Goal status: INITIAL  2.  With Min A, patient will improve speech intelligibility for  sentence by controlling  rate of speech, over-articulation, and increased loudness to achieve 90% intelligibility.  Baseline:  Goal status: INITIAL  LONG TERM GOALS: Target date: 09/06/2023  With minimal assistance, pt will utilize speech intelligibility strategies to achieve ~ 75% intelligibility with common functional phrases.  Baseline:  Goal status: INITIAL  2.  With Rare Min A, patient will demo HEP for motor speech accurately in 5/7 opportunities.  Baseline:  Goal status: INITIAL  ASSESSMENT:  CLINICAL IMPRESSION: Patient is a 82 y.o. male who was seen today for a voice evaluation. 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 His moderate dysarthria is c/b by hypophonia, breathiness, decreased articulatory precision  (specifically during the production of stop consonants), cul-de-sa resonance disorder, short utterance length, habitual low pitch and dysprosody. As a result, his speech intelligibility is reduced to ~ 75% at the sentence level. Pt continues with decreased awareness and recall of speech intelligibility strategies as well as reluctance to continue HEP assigned from previous course of Outpatient ST. Short course of skilled ST will be initiated but discontinued if HEP or progress isn't achieved.   Improvement noted in today's session. See the above treatment note for details.    OBJECTIVE IMPAIRMENTS include dysarthria and voice disorder. These impairments are limiting patient from effectively communicating at home and in community. Factors affecting potential to achieve goals and functional outcome are ability to learn/carryover  information, co-morbidities, cooperation/participation level, medical prognosis, previous level of function, and severity of impairments. Patient will benefit from skilled SLP services to address above impairments and improve overall function.  REHAB POTENTIAL: Fair pre-existing MCI, previous courses of ST, noncompliance with HEP  PLAN: SLP FREQUENCY: 1-2x/week  SLP DURATION: 8 weeks  PLANNED INTERVENTIONS: 92507 Treatment of speech (30 or 45 min) , Language facilitation, SLP instruction and feedback, Compensatory strategies, and Patient/family education    Waldron Ducre B. Dreama Saa, M.S., CCC-SLP, Tree surgeon Certified Brain Injury Specialist [facility]  [facility] Rehabilitation Services Office [phone] Ascom [phone] Fax [phone]

## 2023-08-03 ENCOUNTER — Ambulatory Visit: Payer: Medicare Other | Admitting: Speech Pathology

## 2023-08-04 ENCOUNTER — Ambulatory Visit (INDEPENDENT_AMBULATORY_CARE_PROVIDER_SITE_OTHER): Payer: Medicare Other | Admitting: Podiatry

## 2023-08-04 ENCOUNTER — Encounter: Payer: Self-pay | Admitting: Podiatry

## 2023-08-04 DIAGNOSIS — Z794 Long term (current) use of insulin: Secondary | ICD-10-CM | POA: Diagnosis not present

## 2023-08-04 DIAGNOSIS — M2042 Other hammer toe(s) (acquired), left foot: Secondary | ICD-10-CM | POA: Diagnosis not present

## 2023-08-04 DIAGNOSIS — M79675 Pain in left toe(s): Secondary | ICD-10-CM

## 2023-08-04 DIAGNOSIS — M2041 Other hammer toe(s) (acquired), right foot: Secondary | ICD-10-CM | POA: Diagnosis not present

## 2023-08-04 DIAGNOSIS — B351 Tinea unguium: Secondary | ICD-10-CM

## 2023-08-04 DIAGNOSIS — E119 Type 2 diabetes mellitus without complications: Secondary | ICD-10-CM

## 2023-08-04 DIAGNOSIS — M79674 Pain in right toe(s): Secondary | ICD-10-CM

## 2023-08-04 NOTE — Progress Notes (Signed)
  Subjective:  Patient ID: Mike Holloway, male    DOB: Oct 22, 1940,  MRN: [id number]  Chief Complaint  Patient presents with   Nail Problem    Wife stated,  "Trim his toenails and I want him to check the toenail that he removed."   82 y.o. male returns for the above complaint.  Patient presents with thickened elongated showing mycotic toenails x 10 mild pain on palpation ambulate for me to be done he is unable to do it himself.  He also has secondary complaint of getting diabetic shoes.  He is a diabetic with controlled A1c.  He would like to obtain diabetic shoes given his hammertoe contractures  Objective:  There were no vitals filed for this visit. Podiatric Exam: Vascular: dorsalis pedis and posterior tibial pulses are palpable bilateral. Capillary return is immediate. Temperature gradient is WNL. Skin turgor WNL  Sensorium: Normal Semmes Weinstein monofilament test. Normal tactile sensation bilaterally. Nail Exam: Pt has thick disfigured discolored nails with subungual debris noted bilateral entire nail hallux through fifth toenails.  Pain on palpation to the nails. Ulcer Exam: There is no evidence of ulcer or pre-ulcerative changes or infection. Orthopedic Exam: Muscle tone and strength are WNL. No limitations in general ROM. No crepitus or effusions noted.  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 No infection or ulcers    Assessment & Plan:   1. Controlled type 2 diabetes mellitus without complication, with long-term current use of insulin (HCC)   2. Hammertoe, bilateral   3. Pain due to onychomycosis of toenails of both feet     Patient was evaluated and treated and all questions answered.  Hammertoe bilateral with underlying diabetes -All questions and concerns were discussed with the patient in extensive detail -Given the deformity and the contracture of the  hammertoe in the setting of diabetes patient will benefit from diabetic shoes.  He will be scheduled to see Trish for diabetic shoes  Onychomycosis with pain  -Nails palliatively debrided as below. -Educated on self-care  Procedure: Nail Debridement Rationale: pain  Type of Debridement: manual, sharp debridement. Instrumentation: Nail nipper, rotary burr. Number of Nails: 10  Procedures and Treatment: Consent by patient was obtained for treatment procedures. The patient understood the discussion of treatment and procedures well. All questions were answered thoroughly reviewed. Debridement of mycotic and hypertrophic toenails, 1 through 5 bilateral and clearing of subungual debris. No ulceration, no infection noted.  Return Visit-Office Procedure: Patient instructed to return to the office for a follow up visit 3 months for continued evaluation and treatment.  Nicholes Rough, DPM    No follow-ups on file.

## 2023-08-08 ENCOUNTER — Ambulatory Visit: Payer: Medicare Other | Admitting: Speech Pathology

## 2023-08-09 ENCOUNTER — Ambulatory Visit (INDEPENDENT_AMBULATORY_CARE_PROVIDER_SITE_OTHER): Payer: Medicare Other | Admitting: Physician Assistant

## 2023-08-09 ENCOUNTER — Encounter: Payer: Self-pay | Admitting: Physician Assistant

## 2023-08-09 VITALS — BP 147/77 | HR 70

## 2023-08-09 DIAGNOSIS — R339 Retention of urine, unspecified: Secondary | ICD-10-CM | POA: Diagnosis not present

## 2023-08-09 DIAGNOSIS — Z466 Encounter for fitting and adjustment of urinary device: Secondary | ICD-10-CM

## 2023-08-09 NOTE — Progress Notes (Signed)
Cath Change/ Replacement  Patient is present today for a catheter change due to urinary retention.  8ml of water was removed from the balloon, a 16FR coude foley cath was removed without difficulty.  Patient was cleaned and prepped in a sterile fashion with betadine and 2% lidocaine jelly was instilled into the urethra. A 16 FR coude foley cath was replaced into the bladder, no complications were noted. Urine return was noted 20ml and urine was yellow in color. The balloon was filled with 10ml of sterile water. A leg bag was attached for drainage.  Patient tolerated well.    Performed by: Carman Ching, PA-C   Follow up: Return in about 4 weeks (around 09/06/2023) for Catheter exchange.

## 2023-08-10 ENCOUNTER — Encounter: Payer: Medicare Other | Admitting: Speech Pathology

## 2023-08-11 ENCOUNTER — Encounter: Payer: Medicare Other | Admitting: Speech Pathology

## 2023-08-16 ENCOUNTER — Encounter: Payer: Medicare Other | Admitting: Speech Pathology

## 2023-08-18 ENCOUNTER — Ambulatory Visit: Payer: Medicare Other | Admitting: Physician Assistant

## 2023-08-18 ENCOUNTER — Ambulatory Visit: Payer: Medicare Other | Admitting: Speech Pathology

## 2023-08-18 ENCOUNTER — Telehealth: Payer: Self-pay | Admitting: Physician Assistant

## 2023-08-18 DIAGNOSIS — R471 Dysarthria and anarthria: Secondary | ICD-10-CM

## 2023-08-18 DIAGNOSIS — G250 Essential tremor: Secondary | ICD-10-CM

## 2023-08-18 NOTE — Therapy (Signed)
OUTPATIENT SPEECH LANGUAGE PATHOLOGY  VOICE TREATMENT    Patient Name: Mike Holloway MRN: [id number] DOB:1941-02-22, 82 y.o., male Today's Date: 08/18/2023  PCP: [name], MD REFERRING PROVIDER: [name], PA   End of Session - 08/18/23 1357     Visit Number 6    Number of Visits 17    Date for SLP Re-Evaluation 09/06/23    Authorization Type Medicare A/Medicare B    Progress Note Due on Visit 10    SLP Start Time 1445    SLP Stop Time  1530    SLP Time Calculation (min) 45 min    Activity Tolerance Patient tolerated treatment well             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   SIADH (syndrome of inappropriate ADH production) (HCC)    Sleep apnea    Stroke [facility])    Past Surgical History:  Procedure Laterality Date   ABLATION     APPENDECTOMY     CATARACT EXTRACTION, BILATERAL     COLONOSCOPY WITH PROPOFOL N/A 03/17/2018   Procedure: COLONOSCOPY WITH PROPOFOL;  Surgeon: [name], MD;  Location: [facility] 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   Procedure: FECAL TRANSPLANT;  Surgeon: [name], MD;  Location: [facility] ENDOSCOPY;  Service: Gastroenterology;  Laterality: N/A;   HEMORRHOID SURGERY     HERNIA REPAIR     HIP FRACTURE SURGERY     INTRAMEDULLARY (IM) NAIL INTERTROCHANTERIC Right 09/16/2018   Procedure: INTRAMEDULLARY (IM) NAIL INTERTROCHANTRIC;  Surgeon: [name], MD;  Location: [facility] ORS;  Service: Orthopedics;  Laterality: Right;   IR CATHETER TUBE CHANGE  11/22/2018   NASAL SINUS SURGERY     ORCHIECTOMY     TONSILLECTOMY     Patient Active Problem List   Diagnosis Date Noted   DVT femoral (deep  venous thrombosis) with thrombophlebitis, left (HCC) 02/06/2020   Lymphedema 04/15/2019   Bilateral leg edema 01/25/2019   Hyponatremia 12/24/2018   SIADH (syndrome of inappropriate ADH production) (HCC) 12/24/2018   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) 10/27/2018   Generalized weakness 10/14/2018   Hip fracture (HCC) 09/15/2018   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   HLD (hyperlipidemia) 03/24/2018   Contusion of right knee 02/14/2018   Bradycardia 12/28/2017   Status post right unicompartmental knee replacement 11/03/2017   Tremor 09/04/2016   Chronic pain of right knee 08/10/2016   Right ankle pain 08/10/2016   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   Non-Hodgkin's lymphoma (HCC) 12/11/2015   Urinary retention 09/08/2015   Lymphoma, non-Hodgkin's (HCC) 04/03/2015   Breathlessness on exertion 11/21/2014   Breath shortness 11/21/2014   Arthropathy 11/07/2014   Atrial flutter, paroxysmal (HCC) 11/07/2014   Type 2 diabetes mellitus (HCC) 11/07/2014   Benign essential tremor 11/07/2014   Benign essential HTN 11/07/2014   Mixed incontinence 11/07/2014   Hypercholesterolemia without hypertriglyceridemia 11/07/2014   Apnea, sleep 11/07/2014   Controlled type 2 diabetes mellitus without complication (HCC) 11/07/2014   Pure hypercholesterolemia 11/07/2014  Other abnormalities of gait and mobility 11/02/2011   Decreased mobility 11/02/2011   Abnormal gait 06/29/2011   Discoordination 06/29/2011    ONSET DATE:  date of referral 07/08/2023; chronic diagnosis since 2017  REFERRING DIAG: G25.0 (ICD-10-CM) - Essential tremor  THERAPY DIAG:  Dysarthria and anarthria  Essential tremor  Rationale for Evaluation and Treatment Rehabilitation  SUBJECTIVE:   PERTINENT HISTORY and DIAGNOSTIC TESTING: Pt is an 82 year old male with long standing history of  essential tremor, 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 DBS and thalamotomy both provided some improvement in his tremor, though he continues to struggle with activities due to his action and postural tremor. 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 MBS 07/05/2018: "Patient presents with mild-moderate oropharyngeal dysphagia.". MBS 05/04/22: "Patient presents with mild-moderate oropharyngeal dysphagia consistent with prior objective testing.". MBS 03/15/2023 "Pt presents with what appears to be mild-moderate, chronic oropharyngeal dysphagia with contributing factors of sensorimotor deficits, mild cognitive deficits(per Neurology/Wife), weakness, and potential age-related changes."   PAIN:  Are you having pain? No   FALLS: Has patient fallen in last 6 months? No  LIVING ENVIRONMENT: Lives with: lives with their spouse Lives in: House/apartment  PLOF: Requires assistive device for independence, Needs assistance with ADLs, Needs assistance with gait, and Needs assistance with transfers  PATIENT GOALS    pt's wife would like to improve pt's word finding, speech intelligibility and improve caregiver education for pt support  SUBJECTIVE STATEMENT: Pt missed several sessions d/t appts, noticeably louder speech during casual conversation in the hallway Pt accompanied by: significant other  OBJECTIVE:   TODAY'S TREATMENT:  Skilled treatment session focused on pt's dysarthria goals. SLP facilitated session by providing the following interventions:  Pt continues to report developing headache while completing exercises using the breather and the loud ah's/glides.  During today's session, pt was able to complete 1  set of breather before getting a headache. When progressing ahead to counting and functional phrases pt with initial loudness of 73 dB and increased to 77 dB with cues. Extensive education provided to his wife on creating a motivation for pt to communicate. Encouraged her to cease anticipating his needs and have him express his desires. This will help encourage pt to be more verbal. At baseline, pt was not a conversationalist, therefore doubt he will become one thru ST. In addition extensive education provided to pt that eventually ST services might not be effective in remediating his speech. As pt ages, prognosis will decline.      PATIENT EDUCATION: Education details: see above Person educated: Patient and Spouse Education method: Explanation Education comprehension: needs further education   HOME EXERCISE PROGRAM: Complete exercises using The Breather Complete AH, glides counting and reading phrases Read list of personalized functional phrases     GOALS: Goals reviewed with patient? Yes  SHORT TERM GOALS: Target date: 10 sessions  To determine optimal resistance levels for Respiratory Muscle Training (RMT) for improving increase hyolaryngeal elevation and strengthen cough for airway clearance, patient will participate in evaluation (and re-assessment as needed) of maximum expiratory pressure (MEP). Baseline: Goal status: INITIAL  2.  With Min A, patient will improve speech intelligibility for  sentence by controlling rate of speech, over-articulation, and increased loudness to achieve 90% intelligibility.  Baseline:  Goal status: INITIAL  LONG TERM GOALS: Target date: 09/06/2023  With minimal assistance, pt will utilize speech intelligibility strategies to achieve ~ 75% intelligibility with common functional phrases.  Baseline:  Goal status: INITIAL  2.  With Rare Min A, patient will demo HEP for motor speech accurately in 5/7 opportunities.  Baseline:  Goal status:  INITIAL  ASSESSMENT:  CLINICAL IMPRESSION: Patient is a 82 y.o. male who was seen today for a voice evaluation. 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 His moderate dysarthria is c/b by hypophonia, breathiness, decreased articulatory precision  (specifically during the production of stop consonants), cul-de-sa resonance disorder, short utterance length, habitual low pitch and dysprosody. As a result, his speech intelligibility is reduced to ~ 75% at the sentence level. Pt continues with decreased awareness and recall of speech intelligibility strategies as well as reluctance to continue HEP assigned from previous course of Outpatient ST. Short course of skilled ST will be initiated but discontinued if HEP or progress isn't achieved.   Improvement noted in today's session. See the above treatment note for details.    OBJECTIVE IMPAIRMENTS include dysarthria and voice disorder. These impairments are limiting patient from effectively communicating at home and in community. Factors affecting potential to achieve goals and functional outcome are ability to learn/carryover information, co-morbidities, cooperation/participation level, medical prognosis, previous level of function, and severity of impairments. Patient will benefit from skilled SLP services to address above impairments and improve overall function.  REHAB POTENTIAL: Fair pre-existing MCI, previous courses of ST, noncompliance with HEP  PLAN: SLP FREQUENCY: 1-2x/week  SLP DURATION: 8 weeks  PLANNED INTERVENTIONS: 92507 Treatment of speech (30 or 45 min) ,  Language facilitation, SLP instruction and feedback, Compensatory strategies, and Patient/family education    Tilden Broz B. Dreama Saa, M.S., CCC-SLP, Tree surgeon Certified Brain Injury Specialist [facility]  [facility] Rehabilitation Services Office [phone] Ascom [phone] Fax [phone]

## 2023-08-18 NOTE — Telephone Encounter (Addendum)
Patient's wife dropped in office today. Patient was seen on 08/09/23 by Carman Ching, and she told them about a medication that he could try instead of Myrbetriq that started with a T. We didn't have any samples at the time, and patient's wife said we would call them when we got some. What medication, and do we have samples now? Please advise patient.

## 2023-08-22 NOTE — Telephone Encounter (Signed)
Gemtesa samples are at the front desk for them to pick up at their convenience.

## 2023-08-22 NOTE — Telephone Encounter (Signed)
Pt's wife informed, voiced understanding.

## 2023-08-23 ENCOUNTER — Ambulatory Visit: Payer: Medicare Other | Admitting: Speech Pathology

## 2023-08-23 DIAGNOSIS — R471 Dysarthria and anarthria: Secondary | ICD-10-CM

## 2023-08-23 DIAGNOSIS — G250 Essential tremor: Secondary | ICD-10-CM

## 2023-08-23 NOTE — Therapy (Signed)
OUTPATIENT SPEECH LANGUAGE PATHOLOGY  VOICE TREATMENT    Patient Name: Mike Holloway MRN: [id number] DOB:Feb 19, 1941, 82 y.o., male Today's Date: 08/23/2023  PCP: [name], MD REFERRING PROVIDER: [name], PA   End of Session - 08/23/23 1534     Visit Number 7    Number of Visits 17    Date for SLP Re-Evaluation 09/06/23    Authorization Type Medicare A/Medicare B    Progress Note Due on Visit 10    SLP Start Time 1530    SLP Stop Time  1615    SLP Time Calculation (min) 45 min    Activity Tolerance Patient tolerated treatment well             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   SIADH (syndrome of inappropriate ADH production) (HCC)    Sleep apnea    Stroke [facility])    Past Surgical History:  Procedure Laterality Date   ABLATION     APPENDECTOMY     CATARACT EXTRACTION, BILATERAL     COLONOSCOPY WITH PROPOFOL N/A 03/17/2018   Procedure: COLONOSCOPY WITH PROPOFOL;  Surgeon: [name], MD;  Location: [facility] 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   Procedure: FECAL TRANSPLANT;  Surgeon: [name], MD;  Location: [facility] ENDOSCOPY;  Service: Gastroenterology;  Laterality: N/A;   HEMORRHOID SURGERY     HERNIA REPAIR     HIP FRACTURE SURGERY     INTRAMEDULLARY (IM) NAIL INTERTROCHANTERIC Right 09/16/2018   Procedure: INTRAMEDULLARY (IM) NAIL INTERTROCHANTRIC;  Surgeon: [name], MD;  Location: [facility] ORS;  Service: Orthopedics;  Laterality: Right;   IR CATHETER TUBE CHANGE  11/22/2018   NASAL SINUS SURGERY     ORCHIECTOMY     TONSILLECTOMY     Patient Active Problem List   Diagnosis Date Noted   DVT femoral (deep  venous thrombosis) with thrombophlebitis, left (HCC) 02/06/2020   Lymphedema 04/15/2019   Bilateral leg edema 01/25/2019   Hyponatremia 12/24/2018   SIADH (syndrome of inappropriate ADH production) (HCC) 12/24/2018   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) 10/27/2018   Generalized weakness 10/14/2018   Hip fracture (HCC) 09/15/2018   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   HLD (hyperlipidemia) 03/24/2018   Contusion of right knee 02/14/2018   Bradycardia 12/28/2017   Status post right unicompartmental knee replacement 11/03/2017   Tremor 09/04/2016   Chronic pain of right knee 08/10/2016   Right ankle pain 08/10/2016   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   Non-Hodgkin's lymphoma (HCC) 12/11/2015   Urinary retention 09/08/2015   Lymphoma, non-Hodgkin's (HCC) 04/03/2015   Breathlessness on exertion 11/21/2014   Breath shortness 11/21/2014   Arthropathy 11/07/2014   Atrial flutter, paroxysmal (HCC) 11/07/2014   Type 2 diabetes mellitus (HCC) 11/07/2014   Benign essential tremor 11/07/2014   Benign essential HTN 11/07/2014   Mixed incontinence 11/07/2014   Hypercholesterolemia without hypertriglyceridemia 11/07/2014   Apnea, sleep 11/07/2014   Controlled type 2 diabetes mellitus without complication (HCC) 11/07/2014   Pure hypercholesterolemia 11/07/2014  Other abnormalities of gait and mobility 11/02/2011   Decreased mobility 11/02/2011   Abnormal gait 06/29/2011   Discoordination 06/29/2011    ONSET DATE:  date of referral 07/08/2023; chronic diagnosis since 2017  REFERRING DIAG: G25.0 (ICD-10-CM) - Essential tremor  THERAPY DIAG:  Dysarthria and anarthria  Essential tremor  Rationale for Evaluation and Treatment Rehabilitation  SUBJECTIVE:   PERTINENT HISTORY and DIAGNOSTIC TESTING: Pt is an 82 year old male with long standing history of  essential tremor, 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 DBS and thalamotomy both provided some improvement in his tremor, though he continues to struggle with activities due to his action and postural tremor. 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 MBS 07/05/2018: "Patient presents with mild-moderate oropharyngeal dysphagia.". MBS 05/04/22: "Patient presents with mild-moderate oropharyngeal dysphagia consistent with prior objective testing.". MBS 03/15/2023 "Pt presents with what appears to be mild-moderate, chronic oropharyngeal dysphagia with contributing factors of sensorimotor deficits, mild cognitive deficits(per Neurology/Wife), weakness, and potential age-related changes."   PAIN:  Are you having pain? No   FALLS: Has patient fallen in last 6 months? No  LIVING ENVIRONMENT: Lives with: lives with their spouse Lives in: House/apartment  PLOF: Requires assistive device for independence, Needs assistance with ADLs, Needs assistance with gait, and Needs assistance with transfers  PATIENT GOALS    pt's wife would like to improve pt's word finding, speech intelligibility and improve caregiver education for pt support  SUBJECTIVE STATEMENT: Pt and his wife appear in good spirits, she reports that she is making him request  Pt accompanied by: significant other  OBJECTIVE:   TODAY'S TREATMENT:  Skilled treatment session focused on pt's dysarthria goals. SLP facilitated session by providing the following interventions:  Session focused on conversational responses to conversation starters. Pt able to read each conversational starter question with ~ 70% speech intelligibility improving to 85% with Min A  cues for increased vocal intensity. When responding with unknown information, pt with 80% intelligibility.     PATIENT EDUCATION: Education details: see above Person educated: Patient and Spouse Education method: Explanation Education comprehension: needs further education   HOME EXERCISE PROGRAM: Complete exercises using The Breather Complete AH, glides counting and reading phrases Read list of personalized functional phrases     GOALS: Goals reviewed with patient? Yes  SHORT TERM GOALS: Target date: 10 sessions  To determine optimal resistance levels for Respiratory Muscle Training (RMT) for improving increase hyolaryngeal elevation and strengthen cough for airway clearance, patient will participate in evaluation (and re-assessment as needed) of maximum expiratory pressure (MEP). Baseline: Goal status: INITIAL  2.  With Min A, patient will improve speech intelligibility for  sentence by controlling rate of speech, over-articulation, and increased loudness to achieve 90% intelligibility.  Baseline:  Goal status: INITIAL  LONG TERM GOALS: Target date: 09/06/2023  With minimal assistance, pt will utilize speech intelligibility strategies to achieve ~ 75% intelligibility with common functional phrases.  Baseline:  Goal status: INITIAL  2.  With Rare Min A, patient will demo HEP for motor speech accurately in 5/7 opportunities.  Baseline:  Goal status: INITIAL  ASSESSMENT:  CLINICAL IMPRESSION: Patient is a 82 y.o. male who was seen today for a voice evaluation. 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 His moderate dysarthria is c/b by hypophonia, breathiness, decreased articulatory precision  (specifically during the production of stop consonants), cul-de-sa resonance disorder, short utterance length, habitual low pitch and dysprosody. As a result, his speech intelligibility is reduced to ~ 75% at the sentence level. Pt continues with decreased awareness and recall of speech intelligibility strategies as well as reluctance to continue HEP assigned from previous course of Outpatient ST. Short course of skilled ST will be initiated but discontinued if HEP or progress isn't achieved.   Improvement noted in today's session. See the above treatment note for details.    OBJECTIVE IMPAIRMENTS include dysarthria and voice disorder. These impairments are limiting patient from effectively communicating at home and in community. Factors affecting potential to achieve goals and functional outcome are ability to learn/carryover information, co-morbidities, cooperation/participation level, medical prognosis, previous level of function, and severity of impairments. Patient will benefit from skilled SLP services to address above impairments and improve overall function.  REHAB POTENTIAL: Fair pre-existing MCI, previous courses of ST, noncompliance with HEP  PLAN: SLP FREQUENCY: 1-2x/week  SLP DURATION: 8 weeks  PLANNED INTERVENTIONS: 92507 Treatment of speech (30 or 45 min) , Language facilitation, SLP instruction and feedback, Compensatory strategies, and Patient/family education    Suhaan Perleberg B. Dreama Saa, M.S., CCC-SLP, Tree surgeon Certified Brain Injury Specialist [facility]  [facility] Rehabilitation Services Office [phone] Ascom [phone] Fax [phone]

## 2023-08-30 ENCOUNTER — Encounter: Payer: Medicare Other | Admitting: Speech Pathology

## 2023-09-01 ENCOUNTER — Ambulatory Visit: Payer: Medicare Other | Attending: Physician Assistant | Admitting: Speech Pathology

## 2023-09-01 DIAGNOSIS — G250 Essential tremor: Secondary | ICD-10-CM | POA: Insufficient documentation

## 2023-09-01 DIAGNOSIS — R471 Dysarthria and anarthria: Secondary | ICD-10-CM | POA: Diagnosis present

## 2023-09-01 NOTE — Therapy (Signed)
OUTPATIENT SPEECH LANGUAGE PATHOLOGY  VOICE TREATMENT    Patient Name: Mike Holloway MRN: [id number] DOB:12/11/1940, 82 y.o., male Today's Date: 09/01/2023  PCP: [name], MD REFERRING PROVIDER: [name], PA   End of Session - 09/01/23 1446     Visit Number 8    Number of Visits 17    Date for SLP Re-Evaluation 09/06/23    Authorization Type Medicare A/Medicare B    Progress Note Due on Visit 10    SLP Start Time 1445    SLP Stop Time  1530    SLP Time Calculation (min) 45 min    Activity Tolerance Patient tolerated treatment well             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   SIADH (syndrome of inappropriate ADH production) (HCC)    Sleep apnea    Stroke [facility])    Past Surgical History:  Procedure Laterality Date   ABLATION     APPENDECTOMY     CATARACT EXTRACTION, BILATERAL     COLONOSCOPY WITH PROPOFOL N/A 03/17/2018   Procedure: COLONOSCOPY WITH PROPOFOL;  Surgeon: [name], MD;  Location: [facility] 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   Procedure: FECAL TRANSPLANT;  Surgeon: [name], MD;  Location: [facility] ENDOSCOPY;  Service: Gastroenterology;  Laterality: N/A;   HEMORRHOID SURGERY     HERNIA REPAIR     HIP FRACTURE SURGERY     INTRAMEDULLARY (IM) NAIL INTERTROCHANTERIC Right 09/16/2018   Procedure: INTRAMEDULLARY (IM) NAIL INTERTROCHANTRIC;  Surgeon: [name], MD;  Location: [facility] ORS;  Service: Orthopedics;  Laterality: Right;   IR CATHETER TUBE CHANGE  11/22/2018   NASAL SINUS SURGERY     ORCHIECTOMY     TONSILLECTOMY     Patient Active Problem List   Diagnosis Date Noted   DVT femoral (deep  venous thrombosis) with thrombophlebitis, left (HCC) 02/06/2020   Lymphedema 04/15/2019   Bilateral leg edema 01/25/2019   Hyponatremia 12/24/2018   SIADH (syndrome of inappropriate ADH production) (HCC) 12/24/2018   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) 10/27/2018   Generalized weakness 10/14/2018   Hip fracture (HCC) 09/15/2018   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   HLD (hyperlipidemia) 03/24/2018   Contusion of right knee 02/14/2018   Bradycardia 12/28/2017   Status post right unicompartmental knee replacement 11/03/2017   Tremor 09/04/2016   Chronic pain of right knee 08/10/2016   Right ankle pain 08/10/2016   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   Non-Hodgkin's lymphoma (HCC) 12/11/2015   Urinary retention 09/08/2015   Lymphoma, non-Hodgkin's (HCC) 04/03/2015   Breathlessness on exertion 11/21/2014   Breath shortness 11/21/2014   Arthropathy 11/07/2014   Atrial flutter, paroxysmal (HCC) 11/07/2014   Type 2 diabetes mellitus (HCC) 11/07/2014   Benign essential tremor 11/07/2014   Benign essential HTN 11/07/2014   Mixed incontinence 11/07/2014   Hypercholesterolemia without hypertriglyceridemia 11/07/2014   Apnea, sleep 11/07/2014   Controlled type 2 diabetes mellitus without complication (HCC) 11/07/2014   Pure hypercholesterolemia 11/07/2014  Other abnormalities of gait and mobility 11/02/2011   Decreased mobility 11/02/2011   Abnormal gait 06/29/2011   Discoordination 06/29/2011    ONSET DATE:  date of referral 07/08/2023; chronic diagnosis since 2017  REFERRING DIAG: G25.0 (ICD-10-CM) - Essential tremor  THERAPY DIAG:  Dysarthria and anarthria  Essential tremor  Rationale for Evaluation and Treatment Rehabilitation  SUBJECTIVE:   PERTINENT HISTORY and DIAGNOSTIC TESTING: Pt is an 82 year old male with long standing history of  essential tremor, 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 DBS and thalamotomy both provided some improvement in his tremor, though he continues to struggle with activities due to his action and postural tremor. 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 MBS 07/05/2018: "Patient presents with mild-moderate oropharyngeal dysphagia.". MBS 05/04/22: "Patient presents with mild-moderate oropharyngeal dysphagia consistent with prior objective testing.". MBS 03/15/2023 "Pt presents with what appears to be mild-moderate, chronic oropharyngeal dysphagia with contributing factors of sensorimotor deficits, mild cognitive deficits(per Neurology/Wife), weakness, and potential age-related changes."   PAIN:  Are you having pain? No   FALLS: Has patient fallen in last 6 months? No  LIVING ENVIRONMENT: Lives with: lives with their spouse Lives in: House/apartment  PLOF: Requires assistive device for independence, Needs assistance with ADLs, Needs assistance with gait, and Needs assistance with transfers  PATIENT GOALS    pt's wife would like to improve pt's word finding, speech intelligibility and improve caregiver education for pt support  SUBJECTIVE STATEMENT: Discussed current POC and discharge at next session Pt accompanied by: significant other  OBJECTIVE:   TODAY'S TREATMENT:  Skilled treatment session focused on pt's dysarthria goals. SLP facilitated session by providing the following interventions:  Session focused on conversational responses to conversation starters. When responding with unknown information, pt with ~ 60% intelligibility. With moderate cues to increase vocal intensity, pt able to improve to 80%.      PATIENT EDUCATION: Education details: see above Person educated: Patient and Spouse Education method: Explanation Education comprehension: needs further education   HOME EXERCISE PROGRAM: Complete exercises using The Breather Complete AH, glides counting and reading phrases Read list of personalized functional phrases     GOALS: Goals reviewed with patient? Yes  SHORT TERM GOALS: Target date: 10 sessions  To determine optimal resistance levels for Respiratory Muscle Training (RMT) for improving increase hyolaryngeal elevation and strengthen cough for airway clearance, patient will participate in evaluation (and re-assessment as needed) of maximum expiratory pressure (MEP). Baseline: Goal status: INITIAL  2.  With Min A, patient will improve speech intelligibility for  sentence by controlling rate of speech, over-articulation, and increased loudness to achieve 90% intelligibility.  Baseline:  Goal status: INITIAL  LONG TERM GOALS: Target date: 09/06/2023  With minimal assistance, pt will utilize speech intelligibility strategies to achieve ~ 75% intelligibility with common functional phrases.  Baseline:  Goal status: INITIAL  2.  With Rare Min A, patient will demo HEP for motor speech accurately in 5/7 opportunities.  Baseline:  Goal status: INITIAL  ASSESSMENT:  CLINICAL IMPRESSION: Patient is a 82 y.o. male who was seen today for a voice evaluation. 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 His moderate dysarthria is c/b by hypophonia, breathiness, decreased articulatory precision  (specifically  during the production of stop consonants), [place] resonance disorder, short utterance length, habitual low pitch and dysprosody. As a result, his speech intelligibility is reduced to ~ 75% at the sentence level. Pt continues with decreased awareness and recall of speech intelligibility strategies as well as reluctance to continue HEP assigned from previous course of Outpatient ST. Short course of skilled ST will be initiated but discontinued if HEP or progress isn't achieved.   Pt with intermittent decrease in speech intelligibility during today's session d/t vocal wetness and reduced vocal intensity.  See the above treatment note for details.    OBJECTIVE IMPAIRMENTS include dysarthria and voice disorder. These impairments are limiting patient from effectively communicating at home and in community. Factors affecting potential to achieve goals and functional outcome are ability to learn/carryover information, co-morbidities, cooperation/participation level, medical prognosis, previous level of function, and severity of impairments. Patient will benefit from skilled SLP services to address above impairments and improve overall function.  REHAB POTENTIAL: Fair pre-existing MCI, previous courses of ST, noncompliance with HEP  PLAN: SLP FREQUENCY: 1-2x/week  SLP DURATION: 8 weeks  PLANNED INTERVENTIONS: 92507 Treatment of speech (30 or 45 min) , Language facilitation, SLP instruction and feedback, Compensatory strategies, and Patient/family education    Leilani Cespedes B. Dreama Saa, M.S., CCC-SLP, Tree surgeon Certified Brain Injury Specialist [facility]  [facility] Rehabilitation Services Office [phone] Ascom [phone] Fax [phone]

## 2023-09-06 ENCOUNTER — Ambulatory Visit: Payer: Medicare Other | Admitting: Speech Pathology

## 2023-09-06 DIAGNOSIS — R471 Dysarthria and anarthria: Secondary | ICD-10-CM | POA: Diagnosis not present

## 2023-09-06 DIAGNOSIS — G250 Essential tremor: Secondary | ICD-10-CM

## 2023-09-06 NOTE — Therapy (Unsigned)
OUTPATIENT SPEECH LANGUAGE PATHOLOGY  VOICE TREATMENT DISCHARGE SUMMARY    Patient Name: Mike Holloway MRN: [id number] DOB:[birth year], 82 y.o., male Today's Date: 09/06/2023  PCP: [name], MD REFERRING PROVIDER: [name], PA   End of Session - 09/06/23 1557     Visit Number 9    Number of Visits 17    Date for SLP Re-Evaluation 09/06/23    Authorization Type Medicare A/Medicare B    Progress Note Due on Visit 10    SLP Start Time 1445    SLP Stop Time  1530    SLP Time Calculation (min) 45 min    Activity Tolerance Patient tolerated treatment well             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   SIADH (syndrome of inappropriate ADH production) (HCC)    Sleep apnea    Stroke [facility])    Past Surgical History:  Procedure Laterality Date   ABLATION     APPENDECTOMY     CATARACT EXTRACTION, BILATERAL     COLONOSCOPY WITH PROPOFOL N/A 03/17/2018   Procedure: COLONOSCOPY WITH PROPOFOL;  Surgeon: [name], MD;  Location: [facility] 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   Procedure: FECAL TRANSPLANT;  Surgeon: [name], MD;  Location: [facility] ENDOSCOPY;  Service: Gastroenterology;  Laterality: N/A;   HEMORRHOID SURGERY     HERNIA REPAIR     HIP FRACTURE SURGERY     INTRAMEDULLARY (IM) NAIL INTERTROCHANTERIC Right 09/16/2018   Procedure: INTRAMEDULLARY (IM) NAIL INTERTROCHANTRIC;  Surgeon: [name], MD;  Location: [facility] ORS;  Service: Orthopedics;  Laterality: Right;   IR CATHETER TUBE CHANGE  11/22/2018   NASAL SINUS SURGERY     ORCHIECTOMY     TONSILLECTOMY     Patient Active Problem List   Diagnosis Date Noted    DVT femoral (deep venous thrombosis) with thrombophlebitis, left (HCC) 02/06/2020   Lymphedema 04/15/2019   Bilateral leg edema 01/25/2019   Hyponatremia 12/24/2018   SIADH (syndrome of inappropriate ADH production) (HCC) 12/24/2018   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) 10/27/2018   Generalized weakness 10/14/2018   Hip fracture (HCC) 09/15/2018   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   HLD (hyperlipidemia) 03/24/2018   Contusion of right knee 02/14/2018   Bradycardia 12/28/2017   Status post right unicompartmental knee replacement 11/03/2017   Tremor 09/04/2016   Chronic pain of right knee 08/10/2016   Right ankle pain 08/10/2016   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   Non-Hodgkin's lymphoma (HCC) 12/11/2015   Urinary retention 09/08/2015   Lymphoma, non-Hodgkin's (HCC) 04/03/2015   Breathlessness on exertion 11/21/2014   Breath shortness 11/21/2014   Arthropathy 11/07/2014   Atrial flutter, paroxysmal (HCC) 11/07/2014   Type 2 diabetes mellitus (HCC) 11/07/2014   Benign essential tremor 11/07/2014   Benign essential HTN 11/07/2014   Mixed incontinence 11/07/2014   Hypercholesterolemia without hypertriglyceridemia 11/07/2014   Apnea, sleep 11/07/2014   Controlled type 2 diabetes mellitus without complication (HCC) 11/07/2014   Pure  hypercholesterolemia 11/07/2014   Other abnormalities of gait and mobility 11/02/2011   Decreased mobility 11/02/2011   Abnormal gait 06/29/2011   Discoordination 06/29/2011    ONSET DATE:  date of referral 07/08/2023; chronic diagnosis since 2017  REFERRING DIAG: G25.0 (ICD-10-CM) - Essential tremor  THERAPY DIAG:  Dysarthria and anarthria  Essential tremor  Rationale for Evaluation and Treatment Rehabilitation  SUBJECTIVE:   PERTINENT HISTORY and DIAGNOSTIC TESTING: Pt is an 82 year old male with long standing  history of essential tremor, 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 DBS and thalamotomy both provided some improvement in his tremor, though he continues to struggle with activities due to his action and postural tremor. 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 MBS 07/05/2018: "Patient presents with mild-moderate oropharyngeal dysphagia.". MBS 05/04/22: "Patient presents with mild-moderate oropharyngeal dysphagia consistent with prior objective testing.". MBS 03/15/2023 "Pt presents with what appears to be mild-moderate, chronic oropharyngeal dysphagia with contributing factors of sensorimotor deficits, mild cognitive deficits(per Neurology/Wife), weakness, and potential age-related changes."   PAIN:  Are you having pain? No   FALLS: Has patient fallen in last 6 months? No  LIVING ENVIRONMENT: Lives with: lives with their spouse Lives in: House/apartment  PLOF: Requires assistive device for independence, Needs assistance with ADLs, Needs assistance with gait, and Needs assistance with transfers  PATIENT GOALS    pt's wife would like to improve pt's word finding, speech intelligibility and improve caregiver education for pt support  SUBJECTIVE STATEMENT: Pt verbal and conversant during today's session Pt accompanied by: significant other  OBJECTIVE:   TODAY'S TREATMENT:  Skilled treatment session focused on pt's dysarthria goals. SLP facilitated session by providing the following interventions:  Session focused on conversational responses to conversation starters. When responding with unknown information, pt with ~ 85% intelligibility and with rare Min A cues pt able to increase to > 95% intelligibility.    Education provided on aspiration precautions, s/s of aspiration pneumonia. Pt and his wife voiced understanding.     PATIENT EDUCATION: Education details: see above Person educated: Patient and Spouse Education method: Explanation Education comprehension: needs further education   HOME EXERCISE PROGRAM: Complete exercises using The Breather Complete AH, glides counting and reading phrases Read list of personalized functional phrases     GOALS: Goals reviewed with patient? Yes  SHORT TERM GOALS: Target date: 10 sessions  To determine optimal resistance levels for Respiratory Muscle Training (RMT) for improving increase hyolaryngeal elevation and strengthen cough for airway clearance, patient will participate in evaluation (and re-assessment as needed) of maximum expiratory pressure (MEP). Baseline: Goal status: INITIAL - unable to meet d/t increased headaches  2.  With Min A, patient will improve speech intelligibility for  sentence by controlling rate of speech, over-articulation, and increased loudness to achieve 90% intelligibility.  Baseline:  Goal status: INITIAL- MET  LONG TERM GOALS: Target date: 09/06/2023  With minimal assistance, pt will utilize speech intelligibility strategies to achieve ~ 75% intelligibility with common functional phrases.  Baseline:  Goal status: INITIAL - MET  2.  With Rare Min A, patient will demo HEP for motor speech accurately in 5/7 opportunities.  Baseline:  Goal status: INITIAL _ MET  ASSESSMENT:  CLINICAL IMPRESSION: Pt has made great progress over the course of skilled  ST sessions and as a result, he has met his goals and is appropriate for discharge from services.    OBJECTIVE IMPAIRMENTS include dysarthria and voice disorder. These impairments are limiting patient from effectively communicating at home and in community. Factors affecting potential to achieve goals and functional outcome are ability to learn/carryover  information, co-morbidities, cooperation/participation level, medical prognosis, previous level of function, and severity of impairments. Patient will benefit from skilled SLP services to address above impairments and improve overall function.  REHAB POTENTIAL: Fair pre-existing MCI, previous courses of ST, noncompliance with HEP  PLAN: Discharge from services  Mike Holloway B. Dreama Saa, M.S., CCC-SLP, Tree surgeon Certified Brain Injury Specialist [facility]  [facility] Rehabilitation Services Office [phone] Ascom [phone] Fax [phone]

## 2023-09-07 ENCOUNTER — Ambulatory Visit: Payer: Medicare Other | Admitting: Physician Assistant

## 2023-09-08 ENCOUNTER — Ambulatory Visit: Payer: Medicare Other | Admitting: Speech Pathology

## 2023-09-09 ENCOUNTER — Ambulatory Visit (INDEPENDENT_AMBULATORY_CARE_PROVIDER_SITE_OTHER): Payer: Medicare Other | Admitting: Physician Assistant

## 2023-09-09 ENCOUNTER — Encounter: Payer: Self-pay | Admitting: Physician Assistant

## 2023-09-09 DIAGNOSIS — Z466 Encounter for fitting and adjustment of urinary device: Secondary | ICD-10-CM

## 2023-09-09 DIAGNOSIS — R339 Retention of urine, unspecified: Secondary | ICD-10-CM | POA: Diagnosis not present

## 2023-09-09 NOTE — Progress Notes (Signed)
Cath Change/ Replacement  Patient is present today for a catheter change due to urinary retention.  8ml of water was removed from the balloon, a 16FR coude foley cath was removed without difficulty.  Patient was cleaned and prepped in a sterile fashion with betadine and 2% lidocaine jelly was instilled into the urethra. A 16 FR coude foley cath was replaced into the bladder, no complications were noted. Urine return was noted 5ml and urine was yellow in color. The balloon was filled with 10ml of sterile water. A leg bag was attached for drainage.  Patient tolerated well.    Performed by: Carman Ching, PA-C   Follow up: Return in 4 weeks (on 10/07/2023) for cath change, Catheter exchange.

## 2023-09-13 ENCOUNTER — Ambulatory Visit: Payer: Medicare Other | Admitting: Speech Pathology

## 2023-09-15 ENCOUNTER — Ambulatory Visit: Payer: Medicare Other | Admitting: Speech Pathology

## 2023-09-27 ENCOUNTER — Encounter: Payer: Medicare Other | Admitting: Speech Pathology

## 2023-09-29 ENCOUNTER — Encounter: Payer: Medicare Other | Admitting: Speech Pathology

## 2023-10-04 ENCOUNTER — Ambulatory Visit: Payer: Medicare Other | Attending: Family Medicine | Admitting: Physical Therapy

## 2023-10-04 DIAGNOSIS — R269 Unspecified abnormalities of gait and mobility: Secondary | ICD-10-CM | POA: Insufficient documentation

## 2023-10-04 DIAGNOSIS — R2689 Other abnormalities of gait and mobility: Secondary | ICD-10-CM | POA: Diagnosis present

## 2023-10-04 DIAGNOSIS — R293 Abnormal posture: Secondary | ICD-10-CM | POA: Diagnosis present

## 2023-10-04 DIAGNOSIS — M6281 Muscle weakness (generalized): Secondary | ICD-10-CM | POA: Insufficient documentation

## 2023-10-06 ENCOUNTER — Ambulatory Visit: Payer: Medicare Other | Admitting: Physical Therapy

## 2023-10-06 ENCOUNTER — Ambulatory Visit (INDEPENDENT_AMBULATORY_CARE_PROVIDER_SITE_OTHER): Payer: Medicare Other | Admitting: Physician Assistant

## 2023-10-06 DIAGNOSIS — R310 Gross hematuria: Secondary | ICD-10-CM | POA: Diagnosis not present

## 2023-10-06 DIAGNOSIS — R269 Unspecified abnormalities of gait and mobility: Secondary | ICD-10-CM

## 2023-10-06 DIAGNOSIS — Z87898 Personal history of other specified conditions: Secondary | ICD-10-CM | POA: Diagnosis not present

## 2023-10-06 DIAGNOSIS — R293 Abnormal posture: Secondary | ICD-10-CM

## 2023-10-06 DIAGNOSIS — R2689 Other abnormalities of gait and mobility: Secondary | ICD-10-CM

## 2023-10-06 DIAGNOSIS — R339 Retention of urine, unspecified: Secondary | ICD-10-CM

## 2023-10-06 DIAGNOSIS — M6281 Muscle weakness (generalized): Secondary | ICD-10-CM

## 2023-10-06 DIAGNOSIS — Z466 Encounter for fitting and adjustment of urinary device: Secondary | ICD-10-CM

## 2023-10-06 NOTE — Progress Notes (Signed)
 Cath Change/ Replacement  Patient is present today for a catheter change due to urinary retention.  8ml of water was removed from the balloon, a 16FR coude foley cath was removed without difficulty.  Patient was cleaned and prepped in a sterile fashion with betadine and 2% lidocaine  jelly was instilled into the urethra. A 16 FR coude foley cath was replaced into the bladder, no complications were noted. Urine return was noted 2ml and urine was yellow in color. The balloon was filled with 10ml of sterile water. A leg bag was attached for drainage.  Patient tolerated well.    Performed by: Ekaterini Capitano, PA-C  Additional notes: He had some gross hematuria this month. While this is most likely 2/2 catheter irritation, will obtain CTU to rule out other causes. No fevers, not clinically infected. No GH today.  Follow up: Return in about 4 weeks (around 11/03/2023) for Catheter exchange and CTU results.

## 2023-10-07 NOTE — Therapy (Signed)
 OUTPATIENT PHYSICAL THERAPY LOWER EXTREMITY EVALUATION   Patient Name: Mike Holloway MRN: [id number] DOB:1941-06-09, 83 y.o., male Today's Date: 10/07/2023  END OF SESSION:  PT End of Session - 10/07/23 2004     Visit Number 1    Number of Visits 16    Date for PT Re-Evaluation 11/29/23    PT Start Time 1639    PT Stop Time 1735    PT Time Calculation (min) 56 min    Activity Tolerance Patient tolerated treatment well             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   SIADH (syndrome of inappropriate ADH production) (HCC)    Sleep apnea    Stroke [facility])    Past Surgical History:  Procedure Laterality Date   ABLATION     APPENDECTOMY     CATARACT EXTRACTION, BILATERAL     COLONOSCOPY WITH PROPOFOL  N/A 03/17/2018   Procedure: COLONOSCOPY WITH PROPOFOL ;  Surgeon: Therisa Bi, MD;  Location: 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   Procedure: FECAL TRANSPLANT;  Surgeon: Therisa Bi, MD;  Location: [facility] ENDOSCOPY;  Service: Gastroenterology;  Laterality: N/A;   HEMORRHOID SURGERY     HERNIA REPAIR     HIP FRACTURE SURGERY     INTRAMEDULLARY (IM) NAIL INTERTROCHANTERIC Right 09/16/2018   Procedure: INTRAMEDULLARY (IM) NAIL INTERTROCHANTRIC;  Surgeon: Kathlynn Sharper, MD;  Location: [facility] ORS;  Service: Orthopedics;  Laterality: Right;   IR CATHETER TUBE CHANGE  11/22/2018   NASAL SINUS SURGERY     ORCHIECTOMY     TONSILLECTOMY     Patient Active Problem List   Diagnosis Date Noted   DVT femoral (deep venous thrombosis) with thrombophlebitis, left (HCC) 02/06/2020   Lymphedema 04/15/2019   Bilateral leg edema 01/25/2019   Hyponatremia  12/24/2018   SIADH (syndrome of inappropriate ADH production) (HCC) 12/24/2018   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) 10/27/2018   Generalized weakness 10/14/2018   Hip fracture (HCC) 09/15/2018   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   HLD (hyperlipidemia) 03/24/2018   Contusion of right knee 02/14/2018   Bradycardia 12/28/2017   Status post right unicompartmental knee replacement 11/03/2017   Tremor 09/04/2016   Chronic pain of right knee 08/10/2016   Right ankle pain 08/10/2016   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   Non-Hodgkin's lymphoma (HCC) 12/11/2015   Urinary retention 09/08/2015   Lymphoma, non-Hodgkin's (HCC) 04/03/2015   Breathlessness on exertion 11/21/2014   Breath shortness 11/21/2014   Arthropathy 11/07/2014   Atrial flutter, paroxysmal (HCC) 11/07/2014   Type 2 diabetes mellitus (HCC) 11/07/2014   Benign essential tremor 11/07/2014   Benign essential HTN 11/07/2014   Mixed incontinence 11/07/2014   Hypercholesterolemia without hypertriglyceridemia 11/07/2014   Apnea, sleep 11/07/2014   Controlled type 2 diabetes mellitus without complication (HCC) 11/07/2014   Pure hypercholesterolemia 11/07/2014   Other abnormalities of gait and mobility 11/02/2011   Decreased mobility 11/02/2011   Abnormal gait 06/29/2011   Discoordination 06/29/2011  PCP: Jeffie Cheryl BRAVO, MD  REFERRING PROVIDER: Jeffie Cheryl BRAVO, MD  REFERRING DIAG: Gait instability  THERAPY DIAG:  Gait difficulty  Abnormal posture  Balance problem  Muscle weakness (generalized)  Rationale for Evaluation and Treatment: Rehabilitation  ONSET DATE: Chronic  SUBJECTIVE:   SUBJECTIVE STATEMENT: Pt. Known well to PT clinic.  Pt. Had a fall in August and requires assist to return to standing.  Pts. Wife reports pt. Is having increase difficulty with  standing/walking.  Pt. Using rollator for household mobility and reports limited tasks out of house.  Pt. C/o 1/10 R knee pain and no current treatment plan.  Pt. Has chronic h/o R knee pain and has tried injections/ brace in past with no benefit.  Pt. Able to walk into church with rollator but requires use of w/c when walking into [place] [employer].  Pt. Able to ascend stairs at son's house with assist and step to pattern.    PERTINENT HISTORY: See MD notes/ recent SLP and OT notes.    PAIN:  Are you having pain? Yes: NPRS scale: 1/10 Pain location: R knee Pain description: sharp/ aching Aggravating factors: standing/walking Relieving factors: sitting  PRECAUTIONS: Fall  RED FLAGS: Bowel or bladder incontinence: Yes:      WEIGHT BEARING RESTRICTIONS: No  FALLS:  Has patient fallen in last 6 months? Yes. Number of falls 1  LIVING ENVIRONMENT: Lives with: lives with their spouse Lives in: House/apartment Stairs: No Has following equipment at home: Environmental Consultant - 4 wheeled and Grab bars  OCCUPATION: Retired  PLOF: Requires assistive device for independence  PATIENT GOALS: Improve LE strength/ gait/ balance.  Decrease fall risk.    NEXT MD VISIT: PRN  OBJECTIVE:  Note: Objective measures were completed at Evaluation unless otherwise noted.  PATIENT SURVEYS:  FOTO initial 29/goal 18  COGNITION: Overall cognitive status: Impaired     SENSATION: Not tested  EDEMA:  NT  MUSCLE LENGTH: NT  POSTURE: rounded shoulders and forward head  PALPATION: R knee joint line tenderness  LOWER EXTREMITY ROM:   B UE/ LE AROM WFL.  Pt. Pain limited with R knee flexion/extension OP.   LOWER EXTREMITY MMT:  MMT Right eval Left eval  Hip flexion 4- 4-  Hip extension    Hip abduction    Hip adduction    Hip internal rotation    Hip external rotation    Knee flexion 5 5  Knee extension 4 4  Ankle dorsiflexion 4 4  Ankle plantarflexion    Ankle inversion    Ankle  eversion     (Blank rows = not tested)  B shoulder: flexion 5/5, abduction 4/5, tricep 4+/5, bicep 5/5.    LOWER EXTREMITY SPECIAL TESTS:  NT  FUNCTIONAL TESTS:  5 times sit to stand: TBD 6 minute walk test: TBD Berg Balance Scale: 25/56 (significant fall risk)  GAIT: Distance walked: in clinic Assistive device utilized: Walker - 4 wheeled Level of assistance: Modified independence and SBA Comments: Antalgic gait pattern with heavy UE assist/ use of brakes on rollator.  Increase c/o R knee pain and fearful of R knee buckling.  High fall risk.  TREATMENT DATE: 10/04/23  See evaluation/ discussed HEP/ walking    PATIENT EDUCATION:  Education details: HEP Person educated: Patient and Spouse Education method: Explanation and Demonstration Education comprehension: verbalized understanding and returned demonstration  HOME EXERCISE PROGRAM: Will issue handouts next tx.  ASSESSMENT:  CLINICAL IMPRESSION: Patient is a pleasant 83 y.o. male who was seen today for physical therapy evaluation and treatment for gait/ balance issues.  Pt. Presents with good B UE/LE AROM and strength deficits in B hip/knees.  Pt. Limited with chronic R knee pain which is exacerbated with wt. Bearing/ walking tasks.  Pt./ wife report a marked decrease in daily activity and difficulty with walking/ balance.  Pt. Will benefit from skilled PT services to increase standing tolerance/ walking household distances with improved balance/ decrease fall risk.    OBJECTIVE IMPAIRMENTS: Abnormal gait, decreased activity tolerance, decreased balance, decreased endurance, decreased mobility, difficulty walking, decreased strength, hypomobility, improper body mechanics, postural dysfunction, and pain.   ACTIVITY LIMITATIONS: standing, stairs, transfers, and locomotion level  PARTICIPATION LIMITATIONS: community  activity and church  PERSONAL FACTORS: Fitness and Past/current experiences are also affecting patient's functional outcome.   REHAB POTENTIAL: Good  CLINICAL DECISION MAKING: Evolving/moderate complexity  EVALUATION COMPLEXITY: Moderate   GOALS: Goals reviewed with patient? Yes  SHORT TERM GOALS: Target date: 11/01/23 Pt. Will increase B hip/LE muscle strength 1/2 muscle grade to improve transfers/ standing tolerance/   Baseline:  see above Goal status: INITIAL   LONG TERM GOALS: Target date: 11/29/23  Pt. Will increase FOTO to 41 to improve pain-free functional mobility.   Baseline: initial 29 Goal status: INITIAL  2.  Pt. Will increase Berg balance test score to >35/56 to decrease fall risk/ improve balance with walking.   Baseline: initial 25 (high fall risk) Goal status: INITIAL  3.  Pt. Able to ambulate from car into [facility] [employer] with use of rollator and SBA/mod. I safely to improve functional mobility.   Baseline: pt. Requires w/c with increase walking distances.  Goal status: INITIAL   PLAN:  PT FREQUENCY: 2x/week  PT DURATION: 8 weeks  PLANNED INTERVENTIONS: 97110-Therapeutic exercises, 97530- Therapeutic activity, [id number]- Neuromuscular re-education, 97535- Self Care, [id number]- Manual therapy, [phone]- Gait training, Patient/Family education, Balance training, Stair training, Joint mobilization, Cryotherapy, and Moist heat  PLAN FOR NEXT SESSION: Static/ dynamic balance tasks.  Assess R knee.    Mike Holloway, PT, DPT # [phone] 10/07/2023, 8:06 PM

## 2023-10-08 ENCOUNTER — Encounter: Payer: Self-pay | Admitting: Physical Therapy

## 2023-10-08 NOTE — Therapy (Signed)
 OUTPATIENT PHYSICAL THERAPY LOWER EXTREMITY EVALUATION   Patient Name: Mike Holloway MRN: [id number] DOB:[birth year], 83 y.o., male Today's Date: 10/06/2023  END OF SESSION:  PT End of Session - 10/08/23 0747     Visit Number 2    Number of Visits 16    Date for PT Re-Evaluation 11/29/23    PT Start Time 1630    PT Stop Time 1726    PT Time Calculation (min) 56 min    Activity Tolerance Patient tolerated treatment well             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   SIADH (syndrome of inappropriate ADH production) (HCC)    Sleep apnea    Stroke [facility])    Past Surgical History:  Procedure Laterality Date   ABLATION     APPENDECTOMY     CATARACT EXTRACTION, BILATERAL     COLONOSCOPY WITH PROPOFOL  N/A 03/17/2018   Procedure: COLONOSCOPY WITH PROPOFOL ;  Surgeon: Therisa Bi, MD;  Location: [facility] 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   Procedure: FECAL TRANSPLANT;  Surgeon: Therisa Bi, MD;  Location: [facility] ENDOSCOPY;  Service: Gastroenterology;  Laterality: N/A;   HEMORRHOID SURGERY     HERNIA REPAIR     HIP FRACTURE SURGERY     INTRAMEDULLARY (IM) NAIL INTERTROCHANTERIC Right 09/16/2018   Procedure: INTRAMEDULLARY (IM) NAIL INTERTROCHANTRIC;  Surgeon: Kathlynn Sharper, MD;  Location: [facility] ORS;  Service: Orthopedics;  Laterality: Right;   IR CATHETER TUBE CHANGE  11/22/2018   NASAL SINUS SURGERY     ORCHIECTOMY     TONSILLECTOMY     Patient Active Problem List   Diagnosis Date Noted   DVT femoral (deep venous thrombosis) with thrombophlebitis, left (HCC) 02/06/2020   Lymphedema 04/15/2019   Bilateral leg edema 01/25/2019   Hyponatremia  12/24/2018   SIADH (syndrome of inappropriate ADH production) (HCC) 12/24/2018   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) 10/27/2018   Generalized weakness 10/14/2018   Hip fracture (HCC) 09/15/2018   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   HLD (hyperlipidemia) 03/24/2018   Contusion of right knee 02/14/2018   Bradycardia 12/28/2017   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   Tremor 09/04/2016   Chronic pain of right knee 08/10/2016   Right ankle pain 08/10/2016   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   Non-Hodgkin's lymphoma (HCC) 12/11/2015   Urinary retention 09/08/2015   Lymphoma, non-Hodgkin's (HCC) 04/03/2015   Breathlessness on exertion 11/21/2014   Breath shortness 11/21/2014   Arthropathy 11/07/2014   Atrial flutter, paroxysmal (HCC) 11/07/2014   Type 2 diabetes mellitus (HCC) 11/07/2014   Benign essential tremor 11/07/2014   Benign essential HTN 11/07/2014   Mixed incontinence 11/07/2014   Hypercholesterolemia without hypertriglyceridemia 11/07/2014   Apnea, sleep 11/07/2014   Controlled type 2 diabetes mellitus without complication (HCC) 11/07/2014   Pure hypercholesterolemia 11/07/2014   Other abnormalities of gait and mobility 11/02/2011   Decreased mobility 11/02/2011   Abnormal gait 06/29/2011   Discoordination 06/29/2011  PCP: Jeffie Cheryl BRAVO, MD  REFERRING PROVIDER: Jeffie Cheryl BRAVO, MD  REFERRING DIAG: Gait instability  THERAPY DIAG:  Gait difficulty  Abnormal posture  Balance problem  Muscle weakness (generalized)  Rationale for Evaluation and Treatment: Rehabilitation  ONSET DATE: Chronic  SUBJECTIVE:   SUBJECTIVE STATEMENT: Pt. Known well to PT clinic.  Pt. Had a fall in August and requires assist to return to standing.  Pts. Wife reports pt. Is having increase difficulty with  standing/walking.  Pt. Using rollator for household mobility and reports limited tasks out of house.  Pt. C/o 1/10 R knee pain and no current treatment plan.  Pt. Has chronic h/o R knee pain and has tried injections/ brace in past with no benefit.  Pt. Able to walk into church with rollator but requires use of w/c when walking into [place] [employer].  Pt. Able to ascend stairs at son's house with assist and step to pattern.    PERTINENT HISTORY: See MD notes/ recent SLP and OT notes.    PAIN:  Are you having pain? Yes: NPRS scale: 1/10 Pain location: R knee Pain description: sharp/ aching Aggravating factors: standing/walking Relieving factors: sitting  PRECAUTIONS: Fall  RED FLAGS: Bowel or bladder incontinence: Yes:      WEIGHT BEARING RESTRICTIONS: No  FALLS:  Has patient fallen in last 6 months? Yes. Number of falls 1  LIVING ENVIRONMENT: Lives with: lives with their spouse Lives in: House/apartment Stairs: No Has following equipment at home: Environmental Consultant - 4 wheeled and Grab bars  OCCUPATION: Retired  PLOF: Requires assistive device for independence  PATIENT GOALS: Improve LE strength/ gait/ balance.  Decrease fall risk.    NEXT MD VISIT: PRN  OBJECTIVE:  Note: Objective measures were completed at Evaluation unless otherwise noted.  PATIENT SURVEYS:  FOTO initial 29/goal 68  COGNITION: Overall cognitive status: Impaired     SENSATION: Not tested  EDEMA:  NT  MUSCLE LENGTH: NT  POSTURE: rounded shoulders and forward head  PALPATION: R knee joint line tenderness  LOWER EXTREMITY ROM:   B UE/ LE AROM WFL.  Pt. Pain limited with R knee flexion/extension OP.   LOWER EXTREMITY MMT:  MMT Right eval Left eval  Hip flexion 4- 4-  Hip extension    Hip abduction    Hip adduction    Hip internal rotation    Hip external rotation    Knee flexion 5 5  Knee extension 4 4  Ankle dorsiflexion 4 4  Ankle plantarflexion    Ankle inversion    Ankle  eversion     (Blank rows = not tested)  B shoulder: flexion 5/5, abduction 4/5, tricep 4+/5, bicep 5/5.    LOWER EXTREMITY SPECIAL TESTS:  NT  FUNCTIONAL TESTS:  5 times sit to stand: TBD 6 minute walk test: TBD Berg Balance Scale: 25/56 (significant fall risk)  GAIT: Distance walked: in clinic Assistive device utilized: Walker - 4 wheeled Level of assistance: Modified independence and SBA Comments: Antalgic gait pattern with heavy UE assist/ use of brakes on rollator.  Increase c/o R knee pain and fearful of R knee buckling.  High fall risk.  TREATMENT DATE: 10/06/23  Subjective:  Pt. Reports no new complaints.  Pts. Wife states pt. Is less active during the day and discussed changes in memory/cognitive status.    There.ex.:  Walking into clinic from car to [place] with use of rollator and SBA/CGA for safety an cuing to increase upright posture.  C/o R knee discomfort.    [place] L4 10 min. B LE only.  Persistent R knee discomfort and PT moved seat back to decrease R knee flexion/ pain.  MH to low back during [place].    Seated marching/ LAQ 20x each.    Neuro:  Sit to stands from chair into //-bar.  Requires UE assist on armrests.    Walking in //-bars with marching/ cuing to increase BOS and upright posture with mirror feedback 3 laps.  Slight increase in R knee pain.    Blaze pods: standing with reaching in //-bars to Blaze pods on mirror (random light set up)- 1 min. X 2.  Seated rest break required.    Standing in front of chair: fishing task to increase standing tasks 2 min. 25 sec.  1 episode of sharp R knee pain requiring a short rest break to recover.  CGA for safety with all standing/ balance tasks.    Amb. From //-bars to car in front of building with CGA for safety and cuing to correct posture/ step pattern.      PATIENT EDUCATION:  Education details:  HEP Person educated: Patient and Spouse Education method: Medical Illustrator Education comprehension: verbalized understanding and returned demonstration  HOME EXERCISE PROGRAM: Will issue handouts next tx.  ASSESSMENT:  CLINICAL IMPRESSION: Pt. Motivated to work with PT to increase LE strength/ standing tolerance.  Pt. Limited with chronic R knee pain which is exacerbated with wt. Bearing/ walking tasks.  1 episode of sharp R knee pain during standing balance/ fishing task. Pt. Requires several short seated rest breaks and heavy use of B UE with sit to stands/ walking with rollator.  Pt. Will benefit from skilled PT services to increase standing tolerance/ walking household distances with improved balance/ decrease fall risk.    OBJECTIVE IMPAIRMENTS: Abnormal gait, decreased activity tolerance, decreased balance, decreased endurance, decreased mobility, difficulty walking, decreased strength, hypomobility, improper body mechanics, postural dysfunction, and pain.   ACTIVITY LIMITATIONS: standing, stairs, transfers, and locomotion level  PARTICIPATION LIMITATIONS: community activity and church  PERSONAL FACTORS: Fitness and Past/current experiences are also affecting patient's functional outcome.   REHAB POTENTIAL: Good  CLINICAL DECISION MAKING: Evolving/moderate complexity  EVALUATION COMPLEXITY: Moderate   GOALS: Goals reviewed with patient? Yes  SHORT TERM GOALS: Target date: 11/01/23 Pt. Will increase B hip/LE muscle strength 1/2 muscle grade to improve transfers/ standing tolerance/   Baseline:  see above Goal status: INITIAL   LONG TERM GOALS: Target date: 11/29/23  Pt. Will increase FOTO to 41 to improve pain-free functional mobility.   Baseline: initial 29 Goal status: INITIAL  2.  Pt. Will increase Berg balance test score to >35/56 to decrease fall risk/ improve balance with walking.   Baseline: initial 25 (high fall risk) Goal status: INITIAL  3.  Pt.  Able to ambulate from car into [facility] [employer] with use of rollator and SBA/mod. I safely to improve functional mobility.   Baseline: pt. Requires w/c with increase walking distances.  Goal status: INITIAL   PLAN:  PT FREQUENCY: 2x/week  PT DURATION: 8 weeks  PLANNED INTERVENTIONS: 97110-Therapeutic exercises, 97530- Therapeutic activity, [id number]- Neuromuscular re-education, 97535- Self  Care, 02859- Manual therapy, [id number]- Gait training, Patient/Family education, Balance training, Stair training, Joint mobilization, Cryotherapy, and Moist heat  PLAN FOR NEXT SESSION: Static/ dynamic balance tasks.  Assess R knee.    Ozell JAYSON Sero, PT, DPT # [phone] 10/08/2023, 7:49 AM

## 2023-10-11 ENCOUNTER — Encounter: Payer: Medicare Other | Admitting: Physical Therapy

## 2023-10-13 ENCOUNTER — Ambulatory Visit: Payer: Medicare Other | Admitting: Physical Therapy

## 2023-10-13 ENCOUNTER — Encounter: Payer: Self-pay | Admitting: Physical Therapy

## 2023-10-13 DIAGNOSIS — R269 Unspecified abnormalities of gait and mobility: Secondary | ICD-10-CM

## 2023-10-13 DIAGNOSIS — R2689 Other abnormalities of gait and mobility: Secondary | ICD-10-CM

## 2023-10-13 DIAGNOSIS — R293 Abnormal posture: Secondary | ICD-10-CM

## 2023-10-13 DIAGNOSIS — M6281 Muscle weakness (generalized): Secondary | ICD-10-CM

## 2023-10-13 NOTE — Therapy (Signed)
OUTPATIENT PHYSICAL THERAPY LOWER EXTREMITY TREATMENT  Patient Name: Mike Holloway MRN: [id number] DOB:[birth year], 83 y.o., male Today's Date: 10/13/2023  END OF SESSION:  PT End of Session - 10/13/23 1652     Visit Number 3    Number of Visits 16    Date for PT Re-Evaluation 11/29/23    PT Start Time 1642    Activity Tolerance Patient tolerated treatment well            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   SIADH (syndrome of inappropriate ADH production) (HCC)    Sleep apnea    Stroke [facility])    Past Surgical History:  Procedure Laterality Date   ABLATION     APPENDECTOMY     CATARACT EXTRACTION, BILATERAL     COLONOSCOPY WITH PROPOFOL N/A 03/17/2018   Procedure: COLONOSCOPY WITH PROPOFOL;  Surgeon: [name], MD;  Location: [facility] 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   Procedure: FECAL TRANSPLANT;  Surgeon: [name], MD;  Location: [facility] ENDOSCOPY;  Service: Gastroenterology;  Laterality: N/A;   HEMORRHOID SURGERY     HERNIA REPAIR     HIP FRACTURE SURGERY     INTRAMEDULLARY (IM) NAIL INTERTROCHANTERIC Right 09/16/2018   Procedure: INTRAMEDULLARY (IM) NAIL INTERTROCHANTRIC;  Surgeon: [name], MD;  Location: [facility] ORS;  Service: Orthopedics;  Laterality: Right;   IR CATHETER TUBE CHANGE  11/22/2018   NASAL SINUS SURGERY     ORCHIECTOMY     TONSILLECTOMY     Patient Active Problem List   Diagnosis Date Noted   DVT femoral (deep venous thrombosis) with thrombophlebitis, left (HCC) 02/06/2020   Lymphedema 04/15/2019   Bilateral leg edema 01/25/2019   Hyponatremia 12/24/2018   SIADH (syndrome of inappropriate ADH production)  (HCC) 12/24/2018   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) 10/27/2018   Generalized weakness 10/14/2018   Hip fracture (HCC) 09/15/2018   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   HLD (hyperlipidemia) 03/24/2018   Contusion of right knee 02/14/2018   Bradycardia 12/28/2017   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   Tremor 09/04/2016   Chronic pain of right knee 08/10/2016   Right ankle pain 08/10/2016   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   Non-Hodgkin's lymphoma (HCC) 12/11/2015   Urinary retention 09/08/2015   Lymphoma, non-Hodgkin's (HCC) 04/03/2015   Breathlessness on exertion 11/21/2014   Breath shortness 11/21/2014   Arthropathy 11/07/2014   Atrial flutter, paroxysmal (HCC) 11/07/2014   Type 2 diabetes mellitus (HCC) 11/07/2014   Benign essential tremor 11/07/2014   Benign essential HTN 11/07/2014   Mixed incontinence 11/07/2014   Hypercholesterolemia without hypertriglyceridemia 11/07/2014   Apnea, sleep 11/07/2014   Controlled type 2 diabetes mellitus without complication (HCC) 11/07/2014   Pure hypercholesterolemia 11/07/2014   Other abnormalities of gait and mobility 11/02/2011   Decreased mobility 11/02/2011   Abnormal gait 06/29/2011   Discoordination 06/29/2011    PCP: Marina Goodell, MD  REFERRING PROVIDER: Marina Goodell, MD  REFERRING DIAG: Gait instability  THERAPY DIAG:  Gait difficulty  Abnormal posture  Balance problem  Muscle weakness (generalized)  Rationale for Evaluation and Treatment: Rehabilitation  ONSET DATE: Chronic  SUBJECTIVE:   SUBJECTIVE STATEMENT: Pt. Known well to PT clinic.  Pt. Had a fall in August and requires assist to return to standing.  Pts. Wife reports pt. Is having increase difficulty with standing/walking.  Pt. Using rollator for household mobility and reports  limited tasks out of house.  Pt. C/o 1/10 R knee pain and no current treatment plan.  Pt. Has chronic h/o R knee pain and has tried injections/ brace in past with no benefit.  Pt. Able to walk into church with rollator but requires use of w/c when walking into [place] [employer].  Pt. Able to ascend stairs at son's house with assist and step to pattern.    PERTINENT HISTORY: See MD notes/ recent SLP and OT notes.    PAIN:  Are you having pain? Yes: NPRS scale: 1/10 Pain location: R knee Pain description: sharp/ aching Aggravating factors: standing/walking Relieving factors: sitting  PRECAUTIONS: Fall  RED FLAGS: Bowel or bladder incontinence: Yes:      WEIGHT BEARING RESTRICTIONS: No  FALLS:  Has patient fallen in last 6 months? Yes. Number of falls 1  LIVING ENVIRONMENT: Lives with: lives with their spouse Lives in: House/apartment Stairs: No Has following equipment at home: Environmental consultant - 4 wheeled and Grab bars  OCCUPATION: Retired  PLOF: Requires assistive device for independence  PATIENT GOALS: Improve LE strength/ gait/ balance.  Decrease fall risk.    NEXT MD VISIT: PRN  OBJECTIVE:  Note: Objective measures were completed at Evaluation unless otherwise noted.  PATIENT SURVEYS:  FOTO initial 29/goal 82  COGNITION: Overall cognitive status: Impaired     SENSATION: Not tested  EDEMA:  NT  MUSCLE LENGTH: NT  POSTURE: rounded shoulders and forward head  PALPATION: R knee joint line tenderness  LOWER EXTREMITY ROM:   B UE/ LE AROM WFL.  Pt. Pain limited with R knee flexion/extension OP.   LOWER EXTREMITY MMT:  MMT Right eval Left eval  Hip flexion 4- 4-  Hip extension    Hip abduction    Hip adduction    Hip internal rotation    Hip external rotation    Knee flexion 5 5  Knee extension 4 4  Ankle dorsiflexion 4 4  Ankle plantarflexion    Ankle inversion    Ankle eversion     (Blank rows = not tested)  B shoulder: flexion 5/5,  abduction 4/5, tricep 4+/5, bicep 5/5.    LOWER EXTREMITY SPECIAL TESTS:  NT  FUNCTIONAL TESTS:  5 times sit to stand: TBD 6 minute walk test: TBD Berg Balance Scale: 25/56 (significant fall risk)  GAIT: Distance walked: in clinic Assistive device utilized: Walker - 4 wheeled Level of assistance: Modified independence and SBA Comments: Antalgic gait pattern with heavy UE assist/ use of brakes on rollator.  Increase c/o R knee pain and fearful of R knee buckling.  High fall risk.  TREATMENT DATE: 10/13/2023  Subjective:  Pt. Reports R medial knee tenderness/ discomfort, esp. With standing/walking.  Pt. Entered PT with use of rollator and heavy UE assist for safety.    There.ex.:  Walking into clinic from car to [place] with use of rollator and SBA/CGA for safety an cuing to increase upright posture.  C/o R knee discomfort.    Nustep L4 10 min. B LE only.  MH to low back during Nustep.  Discussed daily activity/ upcoming weekend.    Seated marching/ LAQ 20x each.  Standing hip flexion/ abduction in //-bars with UE assist required.  No knee buckling.    Neuro:  Blaze pods: standing pods touches with L/R LE (random color FOCUS set up) 3 x 1 minute each.       Sit to stands from chair into //-bar.  Requires UE assist on armrests.    Walking in //-bars with 6" hurdle step overs with CGA for safety and  cuing to increase BOS and upright posture with mirror feedback 3 laps x 2.  Slight increase in R medial knee pain.    Standing in front of chair/ //-bars with CGA for safety with all standing/ balance tasks.  Fishing task to promote standing tolerance/ static balacne.  Amb. From //-bars to car in front of building with CGA for safety and cuing to correct posture/ step pattern.      PATIENT EDUCATION:  Education details: HEP Person educated: Patient and Spouse Education  method: Medical illustrator Education comprehension: verbalized understanding and returned demonstration  HOME EXERCISE PROGRAM: Will issue handouts next tx.  ASSESSMENT:  CLINICAL IMPRESSION: Pt. Motivated to work with PT to increase LE strength/ standing tolerance.  Pt. Limited with chronic R knee pain which is exacerbated with wt. Bearing/ walking tasks.  No episodes of sharp R knee pain during standing balance/ walking tasks today. Pt. Requires several short seated rest breaks and heavy use of B UE with sit to stands/ walking with rollator.  Pt. Will benefit from skilled PT services to increase standing tolerance/ walking household distances with improved balance/ decrease fall risk.    OBJECTIVE IMPAIRMENTS: Abnormal gait, decreased activity tolerance, decreased balance, decreased endurance, decreased mobility, difficulty walking, decreased strength, hypomobility, improper body mechanics, postural dysfunction, and pain.   ACTIVITY LIMITATIONS: standing, stairs, transfers, and locomotion level  PARTICIPATION LIMITATIONS: community activity and church  PERSONAL FACTORS: Fitness and Past/current experiences are also affecting patient's functional outcome.   REHAB POTENTIAL: Good  CLINICAL DECISION MAKING: Evolving/moderate complexity  EVALUATION COMPLEXITY: Moderate   GOALS: Goals reviewed with patient? Yes  SHORT TERM GOALS: Target date: 11/01/23 Pt. Will increase B hip/LE muscle strength 1/2 muscle grade to improve transfers/ standing tolerance/   Baseline:  see above Goal status: INITIAL   LONG TERM GOALS: Target date: 11/29/23  Pt. Will increase FOTO to 41 to improve pain-free functional mobility.   Baseline: initial 29 Goal status: INITIAL  2.  Pt. Will increase Berg balance test score to >35/56 to decrease fall risk/ improve balance with walking.   Baseline: initial 25 (high fall risk) Goal status: INITIAL  3.  Pt. Able to ambulate from car into [facility]  [employer] with use of rollator and SBA/mod. I safely to improve functional mobility.   Baseline: pt. Requires w/c with increase walking distances.  Goal status: INITIAL   PLAN:  PT FREQUENCY: 2x/week  PT DURATION: 8 weeks  PLANNED INTERVENTIONS: 97110-Therapeutic exercises, 97530- Therapeutic activity, [id number]- Neuromuscular re-education, 97535- Self Care,  16109- Manual therapy, [phone]- Gait training, Patient/Family education, Balance training, Stair training, Joint mobilization, Cryotherapy, and Moist heat  PLAN FOR NEXT SESSION: Static/ dynamic balance tasks.  Assess R knee.    Cammie Mcgee, PT, DPT # [phone] 10/13/2023, 4:53 PM

## 2023-10-18 ENCOUNTER — Encounter: Payer: Medicare Other | Admitting: Physical Therapy

## 2023-10-20 ENCOUNTER — Ambulatory Visit: Payer: Medicare Other | Admitting: Physical Therapy

## 2023-10-20 ENCOUNTER — Encounter: Payer: Self-pay | Admitting: Physical Therapy

## 2023-10-20 DIAGNOSIS — R269 Unspecified abnormalities of gait and mobility: Secondary | ICD-10-CM

## 2023-10-20 DIAGNOSIS — R293 Abnormal posture: Secondary | ICD-10-CM

## 2023-10-20 DIAGNOSIS — R2689 Other abnormalities of gait and mobility: Secondary | ICD-10-CM

## 2023-10-20 DIAGNOSIS — M6281 Muscle weakness (generalized): Secondary | ICD-10-CM

## 2023-10-20 NOTE — Therapy (Signed)
OUTPATIENT PHYSICAL THERAPY LOWER EXTREMITY TREATMENT  Patient Name: Mike Holloway MRN: [id number] DOB:[birth year], 83 y.o., male Today's Date: 10/20/2023  END OF SESSION:  PT End of Session - 10/20/23 1648     Visit Number 4    Number of Visits 16    Date for PT Re-Evaluation 11/29/23    PT Start Time 1643    Activity Tolerance Patient tolerated treatment well            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   SIADH (syndrome of inappropriate ADH production) (HCC)    Sleep apnea    Stroke [facility])    Past Surgical History:  Procedure Laterality Date   ABLATION     APPENDECTOMY     CATARACT EXTRACTION, BILATERAL     COLONOSCOPY WITH PROPOFOL N/A 03/17/2018   Procedure: COLONOSCOPY WITH PROPOFOL;  Surgeon: [name], MD;  Location: [facility] 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   Procedure: FECAL TRANSPLANT;  Surgeon: [name], MD;  Location: [facility] ENDOSCOPY;  Service: Gastroenterology;  Laterality: N/A;   HEMORRHOID SURGERY     HERNIA REPAIR     HIP FRACTURE SURGERY     INTRAMEDULLARY (IM) NAIL INTERTROCHANTERIC Right 09/16/2018   Procedure: INTRAMEDULLARY (IM) NAIL INTERTROCHANTRIC;  Surgeon: [name], MD;  Location: [facility] ORS;  Service: Orthopedics;  Laterality: Right;   IR CATHETER TUBE CHANGE  11/22/2018   NASAL SINUS SURGERY     ORCHIECTOMY     TONSILLECTOMY     Patient Active Problem List   Diagnosis Date Noted   DVT femoral (deep venous thrombosis) with thrombophlebitis, left (HCC) 02/06/2020   Lymphedema 04/15/2019   Bilateral leg edema 01/25/2019   Hyponatremia 12/24/2018   SIADH (syndrome of inappropriate ADH production)  (HCC) 12/24/2018   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) 10/27/2018   Generalized weakness 10/14/2018   Hip fracture (HCC) 09/15/2018   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   HLD (hyperlipidemia) 03/24/2018   Contusion of right knee 02/14/2018   Bradycardia 12/28/2017   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/03/2017   Tremor 09/04/2016   Chronic pain of right knee 08/10/2016   Right ankle pain 08/10/2016   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   Non-Hodgkin's lymphoma (HCC) 12/11/2015   Urinary retention 09/08/2015   Lymphoma, non-Hodgkin's (HCC) 04/03/2015   Breathlessness on exertion 11/21/2014   Breath shortness 11/21/2014   Arthropathy 11/07/2014   Atrial flutter, paroxysmal (HCC) 11/07/2014   Type 2 diabetes mellitus (HCC) 11/07/2014   Benign essential tremor 11/07/2014   Benign essential HTN 11/07/2014   Mixed incontinence 11/07/2014   Hypercholesterolemia without hypertriglyceridemia 11/07/2014   Apnea, sleep 11/07/2014   Controlled type 2 diabetes mellitus without complication (HCC) 11/07/2014   Pure hypercholesterolemia 11/07/2014   Other abnormalities of gait and mobility 11/02/2011   Decreased mobility 11/02/2011   Abnormal gait 06/29/2011   Discoordination 06/29/2011    PCP: Marina Goodell, MD  REFERRING PROVIDER: Marina Goodell, MD  REFERRING DIAG: Gait instability  THERAPY DIAG:  Gait difficulty  Abnormal posture  Balance problem  Muscle weakness (generalized)  Rationale for Evaluation and Treatment: Rehabilitation  ONSET DATE: Chronic  SUBJECTIVE:   SUBJECTIVE STATEMENT: Pt. Known well to PT clinic.  Pt. Had a fall in August and requires assist to return to standing.  Pts. Wife reports pt. Is having increase difficulty with standing/walking.  Pt. Using rollator for household mobility and reports  limited tasks out of house.  Pt. C/o 1/10 R knee pain and no current treatment plan.  Pt. Has chronic h/o R knee pain and has tried injections/ brace in past with no benefit.  Pt. Able to walk into church with rollator but requires use of w/c when walking into [place] [employer].  Pt. Able to ascend stairs at son's house with assist and step to pattern.    PERTINENT HISTORY: See MD notes/ recent SLP and OT notes.    PAIN:  Are you having pain? Yes: NPRS scale: 1/10 Pain location: R knee Pain description: sharp/ aching Aggravating factors: standing/walking Relieving factors: sitting  PRECAUTIONS: Fall  RED FLAGS: Bowel or bladder incontinence: Yes:      WEIGHT BEARING RESTRICTIONS: No  FALLS:  Has patient fallen in last 6 months? Yes. Number of falls 1  LIVING ENVIRONMENT: Lives with: lives with their spouse Lives in: House/apartment Stairs: No Has following equipment at home: Environmental consultant - 4 wheeled and Grab bars  OCCUPATION: Retired  PLOF: Requires assistive device for independence  PATIENT GOALS: Improve LE strength/ gait/ balance.  Decrease fall risk.    NEXT MD VISIT: PRN  OBJECTIVE:  Note: Objective measures were completed at Evaluation unless otherwise noted.  PATIENT SURVEYS:  FOTO initial 29/goal 57  COGNITION: Overall cognitive status: Impaired     SENSATION: Not tested  EDEMA:  NT  MUSCLE LENGTH: NT  POSTURE: rounded shoulders and forward head  PALPATION: R knee joint line tenderness  LOWER EXTREMITY ROM:   B UE/ LE AROM WFL.  Pt. Pain limited with R knee flexion/extension OP.   LOWER EXTREMITY MMT:  MMT Right eval Left eval  Hip flexion 4- 4-  Hip extension    Hip abduction    Hip adduction    Hip internal rotation    Hip external rotation    Knee flexion 5 5  Knee extension 4 4  Ankle dorsiflexion 4 4  Ankle plantarflexion    Ankle inversion    Ankle eversion     (Blank rows = not tested)  B shoulder: flexion 5/5,  abduction 4/5, tricep 4+/5, bicep 5/5.    LOWER EXTREMITY SPECIAL TESTS:  NT  FUNCTIONAL TESTS:  5 times sit to stand: TBD 6 minute walk test: TBD Berg Balance Scale: 25/56 (significant fall risk)  GAIT: Distance walked: in clinic Assistive device utilized: Walker - 4 wheeled Level of assistance: Modified independence and SBA Comments: Antalgic gait pattern with heavy UE assist/ use of brakes on rollator.  Increase c/o R knee pain and fearful of R knee buckling.  High fall risk.  TREATMENT DATE: 10/20/2023  Subjective:  Pt. Reports no back pain but 2/10 R medial knee pain esp. With standing/walking.  Pt. Entered PT with use of rollator and heavy UE assist for safety.    There.ex.:  Nustep L4 10 min. B LE only.  MH to low back during Nustep.  Discussed daily activity/ upcoming weekend.  Slight increase in R knee pain to 3/10 at 7 minute mark.     Walking into clinic from car to [place] with use of rollator and SBA/CGA for safety an cuing to increase upright posture.  C/o R knee discomfort.    Seated marching/ LAQ 20x each.  Standing hip flexion/ abduction in //-bars with UE assist required.  No knee buckling.    Neuro:  Walking in //-bars: forward/ lateral 2 laps each with UE assist and focus on hip flexion/ step length.  1 episode of increase L knee pain due to bumping L knee on //-bars.    Blaze pods: standing pods touches with UE (random color FOCUS set up) 3 x 1 minute each.       Sit to stands from chair into //-bar.  Requires UE assist on armrests.    Standing in front of chair/ //-bars with CGA for safety with all standing/ balance tasks.    Amb. From //-bars to car at side of building with CGA for safety and cuing to correct posture/ step pattern.   Managing doing down outside ramp to car.    PATIENT EDUCATION:  Education details: HEP Person educated: Patient  and Spouse Education method: Medical illustrator Education comprehension: verbalized understanding and returned demonstration  HOME EXERCISE PROGRAM: Will issue handouts next tx.  ASSESSMENT:  CLINICAL IMPRESSION: Pt. Motivated to work with PT to increase LE strength/ standing tolerance.  Pt. Limited with chronic R knee pain which is exacerbated with wt. Bearing/ walking tasks.  No episodes of sharp R knee pain during standing balance/ walking tasks today. Pt. Requires several short seated rest breaks and heavy use of B UE with sit to stands/ walking with rollator.  Pt. Able to ambulate increase distance today to side of building/ managing ramp with rollator.  Pt. Will benefit from skilled PT services to increase standing tolerance/ walking household distances with improved balance/ decrease fall risk.    OBJECTIVE IMPAIRMENTS: Abnormal gait, decreased activity tolerance, decreased balance, decreased endurance, decreased mobility, difficulty walking, decreased strength, hypomobility, improper body mechanics, postural dysfunction, and pain.   ACTIVITY LIMITATIONS: standing, stairs, transfers, and locomotion level  PARTICIPATION LIMITATIONS: community activity and church  PERSONAL FACTORS: Fitness and Past/current experiences are also affecting patient's functional outcome.   REHAB POTENTIAL: Good  CLINICAL DECISION MAKING: Evolving/moderate complexity  EVALUATION COMPLEXITY: Moderate   GOALS: Goals reviewed with patient? Yes  SHORT TERM GOALS: Target date: 11/01/23 Pt. Will increase B hip/LE muscle strength 1/2 muscle grade to improve transfers/ standing tolerance/   Baseline:  see above Goal status: INITIAL   LONG TERM GOALS: Target date: 11/29/23  Pt. Will increase FOTO to 41 to improve pain-free functional mobility.   Baseline: initial 29 Goal status: INITIAL  2.  Pt. Will increase Berg balance test score to >35/56 to decrease fall risk/ improve balance with walking.    Baseline: initial 25 (high fall risk) Goal status: INITIAL  3.  Pt. Able to ambulate from car into [facility] [employer] with use of rollator and SBA/mod. I safely to improve functional mobility.   Baseline: pt. Requires w/c with increase walking distances.  Goal status: INITIAL   PLAN:  PT FREQUENCY: 2x/week  PT DURATION: 8 weeks  PLANNED INTERVENTIONS: 97110-Therapeutic exercises, 97530- Therapeutic activity, [id number]- Neuromuscular re-education, 97535- Self Care, [id number]- Manual therapy, [phone]- Gait training, Patient/Family education, Balance training, Stair training, Joint mobilization, Cryotherapy, and Moist heat  PLAN FOR NEXT SESSION: Static/ dynamic balance tasks.  Assess R knee.    Cammie Mcgee, PT, DPT # [phone] 10/20/2023, 4:49 PM

## 2023-10-21 ENCOUNTER — Ambulatory Visit
Admission: RE | Admit: 2023-10-21 | Discharge: 2023-10-21 | Disposition: A | Discharge status: Home / Self Care | Payer: Medicare Other | Source: Ambulatory Visit | Attending: Physician Assistant

## 2023-10-21 DIAGNOSIS — R310 Gross hematuria: Secondary | ICD-10-CM | POA: Insufficient documentation

## 2023-10-21 LAB — POCT I-STAT CREATININE: Creatinine, Ser: 1.2 mg/dL (ref 0.61–1.24)

## 2023-10-21 MED ORDER — IOHEXOL 300 MG/ML  SOLN
100.0000 mL | Freq: Once | INTRAMUSCULAR | Status: AC | PRN
  Administered 2023-10-21: 100 mL via INTRAVENOUS

## 2023-10-25 ENCOUNTER — Encounter: Payer: Medicare Other | Admitting: Physical Therapy

## 2023-10-27 ENCOUNTER — Ambulatory Visit: Payer: Medicare Other | Admitting: Physical Therapy

## 2023-10-27 ENCOUNTER — Encounter: Payer: Self-pay | Admitting: Physical Therapy

## 2023-10-27 DIAGNOSIS — R2689 Other abnormalities of gait and mobility: Secondary | ICD-10-CM

## 2023-10-27 DIAGNOSIS — R293 Abnormal posture: Secondary | ICD-10-CM

## 2023-10-27 DIAGNOSIS — R269 Unspecified abnormalities of gait and mobility: Secondary | ICD-10-CM

## 2023-10-27 DIAGNOSIS — M6281 Muscle weakness (generalized): Secondary | ICD-10-CM

## 2023-10-27 NOTE — Therapy (Signed)
OUTPATIENT PHYSICAL THERAPY LOWER EXTREMITY TREATMENT  Patient Name: Mike Holloway MRN: [id number] DOB:Feb 12, 1941, 83 y.o., male Today's Date: 10/28/2023  END OF SESSION:  PT End of Session - 10/27/23 1659     Visit Number 5    Number of Visits 16    Date for PT Re-Evaluation 11/29/23    PT Start Time 1648    PT Stop Time 1739    PT Time Calculation (min) 51 min    Activity Tolerance Patient tolerated treatment well            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   SIADH (syndrome of inappropriate ADH production) (HCC)    Sleep apnea    Stroke [facility])    Past Surgical History:  Procedure Laterality Date   ABLATION     APPENDECTOMY     CATARACT EXTRACTION, BILATERAL     COLONOSCOPY WITH PROPOFOL N/A 03/17/2018   Procedure: COLONOSCOPY WITH PROPOFOL;  Surgeon: [name], MD;  Location: [facility] 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   Procedure: FECAL TRANSPLANT;  Surgeon: [name], MD;  Location: [facility] ENDOSCOPY;  Service: Gastroenterology;  Laterality: N/A;   HEMORRHOID SURGERY     HERNIA REPAIR     HIP FRACTURE SURGERY     INTRAMEDULLARY (IM) NAIL INTERTROCHANTERIC Right 09/16/2018   Procedure: INTRAMEDULLARY (IM) NAIL INTERTROCHANTRIC;  Surgeon: [name], MD;  Location: [facility] ORS;  Service: Orthopedics;  Laterality: Right;   IR CATHETER TUBE CHANGE  11/22/2018   NASAL SINUS SURGERY     ORCHIECTOMY     TONSILLECTOMY     Patient Active Problem List   Diagnosis Date Noted   DVT femoral (deep venous thrombosis) with thrombophlebitis, left (HCC) 02/06/2020   Lymphedema 04/15/2019   Bilateral leg edema 01/25/2019   Hyponatremia  12/24/2018   SIADH (syndrome of inappropriate ADH production) (HCC) 12/24/2018   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) 10/27/2018   Generalized weakness 10/14/2018   Hip fracture (HCC) 09/15/2018   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   Recurrent Clostridium difficile diarrhea 03/24/2018   HLD (hyperlipidemia) 03/24/2018   Contusion of right knee 02/14/2018   Bradycardia 12/28/2017   Status post right unicompartmental knee replacement 11/03/2017   Tremor 09/04/2016   Chronic pain of right knee 08/10/2016   Right ankle pain 08/10/2016   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   Non-Hodgkin's lymphoma (HCC) 12/11/2015   Urinary retention 09/08/2015   Lymphoma, non-Hodgkin's (HCC) 04/03/2015   Breathlessness on exertion 11/21/2014   Breath shortness 11/21/2014   Arthropathy 11/07/2014   Atrial flutter, paroxysmal (HCC) 11/07/2014   Type 2 diabetes mellitus (HCC) 11/07/2014   Benign essential tremor 11/07/2014   Benign essential HTN 11/07/2014   Mixed incontinence 11/07/2014   Hypercholesterolemia without hypertriglyceridemia 11/07/2014   Apnea, sleep 11/07/2014   Controlled type 2 diabetes mellitus without complication (HCC) 11/07/2014   Pure hypercholesterolemia 11/07/2014   Other abnormalities of gait and mobility 11/02/2011   Decreased mobility 11/02/2011   Abnormal gait 06/29/2011   Discoordination 06/29/2011    PCP: Maryjane Hurter,  Madaline Guthrie, MD  REFERRING PROVIDER: Marina Goodell, MD  REFERRING DIAG: Gait instability  THERAPY DIAG:  Gait difficulty  Abnormal posture  Balance problem  Muscle weakness (generalized)  Rationale for Evaluation and Treatment: Rehabilitation  ONSET DATE: Chronic  SUBJECTIVE:   SUBJECTIVE STATEMENT: Pt. Known well to PT clinic.  Pt. Had a fall in August and requires assist to return to standing.  Pts. Wife reports pt. Is having increase difficulty with  standing/walking.  Pt. Using rollator for household mobility and reports limited tasks out of house.  Pt. C/o 1/10 R knee pain and no current treatment plan.  Pt. Has chronic h/o R knee pain and has tried injections/ brace in past with no benefit.  Pt. Able to walk into church with rollator but requires use of w/c when walking into [place] [employer].  Pt. Able to ascend stairs at son's house with assist and step to pattern.    PERTINENT HISTORY: See MD notes/ recent SLP and OT notes.    PAIN:  Are you having pain? Yes: NPRS scale: 1/10 Pain location: R knee Pain description: sharp/ aching Aggravating factors: standing/walking Relieving factors: sitting  PRECAUTIONS: Fall  RED FLAGS: Bowel or bladder incontinence: Yes:      WEIGHT BEARING RESTRICTIONS: No  FALLS:  Has patient fallen in last 6 months? Yes. Number of falls 1  LIVING ENVIRONMENT: Lives with: lives with their spouse Lives in: House/apartment Stairs: No Has following equipment at home: Environmental consultant - 4 wheeled and Grab bars  OCCUPATION: Retired  PLOF: Requires assistive device for independence  PATIENT GOALS: Improve LE strength/ gait/ balance.  Decrease fall risk.    NEXT MD VISIT: PRN  OBJECTIVE:  Note: Objective measures were completed at Evaluation unless otherwise noted.  PATIENT SURVEYS:  FOTO initial 29/goal 26  COGNITION: Overall cognitive status: Impaired     SENSATION: Not tested  EDEMA:  NT  MUSCLE LENGTH: NT  POSTURE: rounded shoulders and forward head  PALPATION: R knee joint line tenderness  LOWER EXTREMITY ROM:   B UE/ LE AROM WFL.  Pt. Pain limited with R knee flexion/extension OP.   LOWER EXTREMITY MMT:  MMT Right eval Left eval  Hip flexion 4- 4-  Hip extension    Hip abduction    Hip adduction    Hip internal rotation    Hip external rotation    Knee flexion 5 5  Knee extension 4 4  Ankle dorsiflexion 4 4  Ankle plantarflexion    Ankle inversion    Ankle  eversion     (Blank rows = not tested)  B shoulder: flexion 5/5, abduction 4/5, tricep 4+/5, bicep 5/5.    LOWER EXTREMITY SPECIAL TESTS:  NT  FUNCTIONAL TESTS:  5 times sit to stand: TBD 6 minute walk test: TBD Berg Balance Scale: 25/56 (significant fall risk)  GAIT: Distance walked: in clinic Assistive device utilized: Walker - 4 wheeled Level of assistance: Modified independence and SBA Comments: Antalgic gait pattern with heavy UE assist/ use of brakes on rollator.  Increase c/o R knee pain and fearful of R knee buckling.  High fall risk.  TREATMENT DATE: 10/28/2023  Subjective:  Pt. Reports no back pain but 2/10 R medial knee pain esp. With standing/walking.  Pt. Entered PT with use of rollator and heavy UE assist for safety.    There.ex.:  Nustep L4 10 min. B LE only.  MH to low back during Nustep.  Discussed daily activity/ upcoming weekend.    Seated marching/ LAQ 20x each.  Standing hip flexion/ abduction in //-bars with UE assist required.  No knee buckling.       There.act.:  Walking into clinic from car to Nustep with use of rollator and SBA/CGA for safety an cuing to increase upright posture.  C/o R knee discomfort.    Standing 3" step overs in //-bars with focus on increase hip flexion/ step length/ heel strength.  No LOB.  3" step taps with 1 UE assist.  Pt. Requires CGA for safety/ unable to complete without UE assist.   Turning in hallway with use of rollator and in //-bars.  Functional reaching/ cross body cone pick ups.    Neuro:  Walking in //-bars: forward/ lateral 2 laps each with UE assist and focus on hip flexion/ step length.     Blaze pods: standing pods touches with UE (random color FOCUS set up) 3 x 1 minute each.  Increase low back symptoms requiring short seated rest breaks.      Standing in front of chair/ //-bars with CGA for safety  with all standing/ balance tasks.    Amb. From //-bars to car at side of building with CGA for safety and cuing to correct posture/ step pattern.   Managing doing down outside ramp to car.    PATIENT EDUCATION:  Education details: HEP Person educated: Patient and Spouse Education method: Medical illustrator Education comprehension: verbalized understanding and returned demonstration  HOME EXERCISE PROGRAM: Will issue handouts next tx.  ASSESSMENT:  CLINICAL IMPRESSION: Pt. Motivated to work with PT to increase LE strength/ standing tolerance.  Pt. Limited with chronic R knee pain which is exacerbated with wt. Bearing/ walking tasks.  Pt. Requires several short seated rest breaks and heavy use of B UE with sit to stands/ walking with rollator.  Pt. Able to ambulate increase distance (96 feet) today and to side of building/ managing ramp with rollator.  Pt. Will benefit from skilled PT services to increase standing tolerance/ walking household distances with improved balance/ decrease fall risk.    OBJECTIVE IMPAIRMENTS: Abnormal gait, decreased activity tolerance, decreased balance, decreased endurance, decreased mobility, difficulty walking, decreased strength, hypomobility, improper body mechanics, postural dysfunction, and pain.   ACTIVITY LIMITATIONS: standing, stairs, transfers, and locomotion level  PARTICIPATION LIMITATIONS: community activity and church  PERSONAL FACTORS: Fitness and Past/current experiences are also affecting patient's functional outcome.   REHAB POTENTIAL: Good  CLINICAL DECISION MAKING: Evolving/moderate complexity  EVALUATION COMPLEXITY: Moderate   GOALS: Goals reviewed with patient? Yes  SHORT TERM GOALS: Target date: 11/01/23 Pt. Will increase B hip/LE muscle strength 1/2 muscle grade to improve transfers/ standing tolerance/   Baseline:  see above Goal status: INITIAL   LONG TERM GOALS: Target date: 11/29/23  Pt. Will increase FOTO to  41 to improve pain-free functional mobility.   Baseline: initial 29 Goal status: INITIAL  2.  Pt. Will increase Berg balance test score to >35/56 to decrease fall risk/ improve balance with walking.   Baseline: initial 25 (high fall risk) Goal status: INITIAL  3.  Pt. Able to ambulate from car into [place] [employer] meeting  with use of rollator and SBA/mod. I safely to improve functional mobility.   Baseline: pt. Requires w/c with increase walking distances.  Goal status: INITIAL   PLAN:  PT FREQUENCY: 2x/week  PT DURATION: 8 weeks  PLANNED INTERVENTIONS: 97110-Therapeutic exercises, 97530- Therapeutic activity, [id number]- Neuromuscular re-education, 97535- Self Care, [id number]- Manual therapy, [phone]- Gait training, Patient/Family education, Balance training, Stair training, Joint mobilization, Cryotherapy, and Moist heat  PLAN FOR NEXT SESSION: Static/ dynamic balance tasks.  ISSUE HEP  Cammie Mcgee, PT, DPT # [phone] 10/28/2023, 1:32 PM

## 2023-10-31 ENCOUNTER — Ambulatory Visit: Payer: Medicare Other | Attending: Family Medicine | Admitting: Physical Therapy

## 2023-10-31 DIAGNOSIS — M6281 Muscle weakness (generalized): Secondary | ICD-10-CM | POA: Diagnosis present

## 2023-10-31 DIAGNOSIS — R2689 Other abnormalities of gait and mobility: Secondary | ICD-10-CM | POA: Insufficient documentation

## 2023-10-31 DIAGNOSIS — Z466 Encounter for fitting and adjustment of urinary device: Secondary | ICD-10-CM | POA: Insufficient documentation

## 2023-10-31 DIAGNOSIS — R269 Unspecified abnormalities of gait and mobility: Secondary | ICD-10-CM | POA: Insufficient documentation

## 2023-10-31 DIAGNOSIS — R278 Other lack of coordination: Secondary | ICD-10-CM | POA: Diagnosis present

## 2023-10-31 DIAGNOSIS — R293 Abnormal posture: Secondary | ICD-10-CM | POA: Insufficient documentation

## 2023-10-31 NOTE — Therapy (Signed)
OUTPATIENT PHYSICAL THERAPY LOWER EXTREMITY TREATMENT  Patient Name: Mike Holloway MRN: [id number] DOB:Sep 15, 1941, 83 y.o., male Today's Date: 10/31/2023  END OF SESSION:  PT End of Session - 10/31/23 1427     Visit Number 6    Number of Visits 16    Date for PT Re-Evaluation 11/29/23    PT Start Time 1427    PT Stop Time 1534    PT Time Calculation (min) 67 min    Activity Tolerance Patient tolerated treatment well            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   SIADH (syndrome of inappropriate ADH production) (HCC)    Sleep apnea    Stroke [facility])    Past Surgical History:  Procedure Laterality Date   ABLATION     APPENDECTOMY     CATARACT EXTRACTION, BILATERAL     COLONOSCOPY WITH PROPOFOL N/A 03/17/2018   Procedure: COLONOSCOPY WITH PROPOFOL;  Surgeon: [name], MD;  Location: [facility] 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   Procedure: FECAL TRANSPLANT;  Surgeon: [name], MD;  Location: [facility] ENDOSCOPY;  Service: Gastroenterology;  Laterality: N/A;   HEMORRHOID SURGERY     HERNIA REPAIR     HIP FRACTURE SURGERY     INTRAMEDULLARY (IM) NAIL INTERTROCHANTERIC Right 09/16/2018   Procedure: INTRAMEDULLARY (IM) NAIL INTERTROCHANTRIC;  Surgeon: [name], MD;  Location: [facility] ORS;  Service: Orthopedics;  Laterality: Right;   IR CATHETER TUBE CHANGE  11/22/2018   NASAL SINUS SURGERY     ORCHIECTOMY     TONSILLECTOMY     Patient Active Problem List   Diagnosis Date Noted   DVT femoral (deep venous thrombosis) with thrombophlebitis, left (HCC) 02/06/2020   Lymphedema 04/15/2019   Bilateral leg edema 01/25/2019   Hyponatremia  12/24/2018   SIADH (syndrome of inappropriate ADH production) (HCC) 12/24/2018   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) 10/27/2018   Generalized weakness 10/14/2018   Hip fracture (HCC) 09/15/2018   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   HLD (hyperlipidemia) 03/24/2018   Contusion of right knee 02/14/2018   Bradycardia 12/28/2017   Status post right unicompartmental knee replacement 11/03/2017   Tremor 09/04/2016   Chronic pain of right knee 08/10/2016   Right ankle pain 08/10/2016   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   Non-Hodgkin's lymphoma (HCC) 12/11/2015   Urinary retention 09/08/2015   Lymphoma, non-Hodgkin's (HCC) 04/03/2015   Breathlessness on exertion 11/21/2014   Breath shortness 11/21/2014   Arthropathy 11/07/2014   Atrial flutter, paroxysmal (HCC) 11/07/2014   Type 2 diabetes mellitus (HCC) 11/07/2014   Benign essential tremor 11/07/2014   Benign essential HTN 11/07/2014   Mixed incontinence 11/07/2014   Hypercholesterolemia without hypertriglyceridemia 11/07/2014   Apnea, sleep 11/07/2014   Controlled type 2 diabetes mellitus without complication (HCC) 11/07/2014   Pure hypercholesterolemia 11/07/2014   Other abnormalities of gait and mobility 11/02/2011   Decreased mobility 11/02/2011   Abnormal gait 06/29/2011   Discoordination 06/29/2011    PCP: Maryjane Hurter,  Madaline Guthrie, MD  REFERRING PROVIDER: Marina Goodell, MD  REFERRING DIAG: Gait instability  THERAPY DIAG:  Gait difficulty  Abnormal posture  Balance problem  Muscle weakness (generalized)  Rationale for Evaluation and Treatment: Rehabilitation  ONSET DATE: Chronic  SUBJECTIVE:   SUBJECTIVE STATEMENT: Pt. Known well to PT clinic.  Pt. Had a fall in August and requires assist to return to standing.  Pts. Wife reports pt. Is having increase difficulty with  standing/walking.  Pt. Using rollator for household mobility and reports limited tasks out of house.  Pt. C/o 1/10 R knee pain and no current treatment plan.  Pt. Has chronic h/o R knee pain and has tried injections/ brace in past with no benefit.  Pt. Able to walk into church with rollator but requires use of w/c when walking into [employer] [employer].  Pt. Able to ascend stairs at son's house with assist and step to pattern.    PERTINENT HISTORY: See MD notes/ recent SLP and OT notes.    PAIN:  Are you having pain? Yes: NPRS scale: 1/10 Pain location: R knee Pain description: sharp/ aching Aggravating factors: standing/walking Relieving factors: sitting  PRECAUTIONS: Fall  RED FLAGS: Bowel or bladder incontinence: Yes:      WEIGHT BEARING RESTRICTIONS: No  FALLS:  Has patient fallen in last 6 months? Yes. Number of falls 1  LIVING ENVIRONMENT: Lives with: lives with their spouse Lives in: House/apartment Stairs: No Has following equipment at home: Environmental consultant - 4 wheeled and Grab bars  OCCUPATION: Retired  PLOF: Requires assistive device for independence  PATIENT GOALS: Improve LE strength/ gait/ balance.  Decrease fall risk.    NEXT MD VISIT: PRN  OBJECTIVE:  Note: Objective measures were completed at Evaluation unless otherwise noted.  PATIENT SURVEYS:  FOTO initial 29/goal 83  COGNITION: Overall cognitive status: Impaired     SENSATION: Not tested  EDEMA:  NT  MUSCLE LENGTH: NT  POSTURE: rounded shoulders and forward head  PALPATION: R knee joint line tenderness  LOWER EXTREMITY ROM:   B UE/ LE AROM WFL.  Pt. Pain limited with R knee flexion/extension OP.   LOWER EXTREMITY MMT:  MMT Right eval Left eval  Hip flexion 4- 4-  Hip extension    Hip abduction    Hip adduction    Hip internal rotation    Hip external rotation    Knee flexion 5 5  Knee extension 4 4  Ankle dorsiflexion 4 4  Ankle plantarflexion    Ankle inversion    Ankle  eversion     (Blank rows = not tested)  B shoulder: flexion 5/5, abduction 4/5, tricep 4+/5, bicep 5/5.    LOWER EXTREMITY SPECIAL TESTS:  NT  FUNCTIONAL TESTS:  5 times sit to stand: TBD 6 minute walk test: TBD Berg Balance Scale: 25/56 (significant fall risk)  GAIT: Distance walked: in clinic Assistive device utilized: Walker - 4 wheeled Level of assistance: Modified independence and SBA Comments: Antalgic gait pattern with heavy UE assist/ use of brakes on rollator.  Increase c/o R knee pain and fearful of R knee buckling.  High fall risk.  TREATMENT DATE: 10/31/2023  Subjective:  Pt. Reports no back pain but 2/10 R medial knee pain esp. With standing/walking.  Pt. Entered PT with use of rollator and heavy UE assist for safety.  Pt. Went to [employer] party this past weekend and walked into party with rollator/ out of party with w/c.    There.ex.:  Nustep L4 10 min. B LE only.  MH to low back during Nustep.  Discussed daily activity/ past weekend.  R knee increased to 5/10 at end of Nustep.    Seated marching/ LAQ 20x each.  Standing hip flexion/ abduction in //-bars with UE assist required.  No knee buckling.          There.act.:  Walking into clinic from car to Nustep with use of rollator and SBA/CGA for safety an cuing to increase upright posture.  C/o R knee discomfort.    Blaze pods: standing pods touches (hip abduction/ flexion/ extension) with LE (random color FOCUS set up) 3 x 1 minute each.  Increase R knee symptoms requiring short seated rest breaks.   Walking over 6"/ 12" hurdles in //-bars with focus on hip/knee flexion to clear hurdle.    Standing 3" step overs in //-bars with focus on increase hip flexion/ step length/ heel strength.  No LOB.  3" step taps with 1 UE assist.  Pt. Requires CGA for safety/ unable to complete without UE assist.   Turning in  hallway with use of rollator and in //-bars.  Functional reaching/ cross body cone pick ups.    Neuro:  Walking in //-bars: forward/ lateral 2 laps each with UE assist and focus on hip flexion/ step length.     Amb. From //-bars to car at side of building with CGA for safety and cuing to correct posture/ step pattern.   Managing doing down outside ramp to car.    PATIENT EDUCATION:  Education details: HEP Person educated: Patient and Spouse Education method: Medical illustrator Education comprehension: verbalized understanding and returned demonstration  HOME EXERCISE PROGRAM: Will issue handouts next tx.  ASSESSMENT:  CLINICAL IMPRESSION: Pt. Motivated to work with PT to increase LE strength/ standing tolerance.  Pt. Limited with chronic R knee pain which is exacerbated with wt. Bearing/ walking tasks.  Pt. Requires several short seated rest breaks and heavy use of B UE with sit to stands/ walking with rollator.  Pt. Able to ambulate increase distance (96 feet) today and to side of building/ managing ramp with rollator.  Pt. Will benefit from skilled PT services to increase standing tolerance/ walking household distances with improved balance/ decrease fall risk.    OBJECTIVE IMPAIRMENTS: Abnormal gait, decreased activity tolerance, decreased balance, decreased endurance, decreased mobility, difficulty walking, decreased strength, hypomobility, improper body mechanics, postural dysfunction, and pain.   ACTIVITY LIMITATIONS: standing, stairs, transfers, and locomotion level  PARTICIPATION LIMITATIONS: community activity and church  PERSONAL FACTORS: Fitness and Past/current experiences are also affecting patient's functional outcome.   REHAB POTENTIAL: Good  CLINICAL DECISION MAKING: Evolving/moderate complexity  EVALUATION COMPLEXITY: Moderate   GOALS: Goals reviewed with patient? Yes  SHORT TERM GOALS: Target date: 11/01/23 Pt. Will increase B hip/LE muscle  strength 1/2 muscle grade to improve transfers/ standing tolerance/   Baseline:  see above Goal status: INITIAL   LONG TERM GOALS: Target date: 11/29/23  Pt. Will increase FOTO to 41 to improve pain-free functional mobility.   Baseline: initial 29 Goal status: INITIAL  2.  Pt. Will increase Berg balance test score to >  35/56 to decrease fall risk/ improve balance with walking.   Baseline: initial 25 (high fall risk) Goal status: INITIAL  3.  Pt. Able to ambulate from car into [place] with use of rollator and SBA/mod. I safely to improve functional mobility.   Baseline: pt. Requires w/c with increase walking distances.  Goal status: INITIAL   PLAN:  PT FREQUENCY: 2x/week  PT DURATION: 8 weeks  PLANNED INTERVENTIONS: 97110-Therapeutic exercises, 97530- Therapeutic activity, [id number]- Neuromuscular re-education, 97535- Self Care, 16109- Manual therapy, [phone]- Gait training, Patient/Family education, Balance training, Stair training, Joint mobilization, Cryotherapy, and Moist heat  PLAN FOR NEXT SESSION: Static/ dynamic balance tasks.  ISSUE HEP  Cammie Mcgee, PT, DPT # [id number] 10/31/2023, 3:44 PM

## 2023-11-01 ENCOUNTER — Encounter: Payer: Self-pay | Admitting: Physical Therapy

## 2023-11-01 ENCOUNTER — Encounter: Payer: Medicare Other | Admitting: Physical Therapy

## 2023-11-03 ENCOUNTER — Encounter: Payer: Medicare Other | Admitting: Physical Therapy

## 2023-11-04 ENCOUNTER — Encounter: Payer: Self-pay | Admitting: Physician Assistant

## 2023-11-04 ENCOUNTER — Ambulatory Visit (INDEPENDENT_AMBULATORY_CARE_PROVIDER_SITE_OTHER): Payer: Medicare Other | Admitting: Physician Assistant

## 2023-11-04 VITALS — BP 130/70 | Ht 68.0 in | Wt 214.0 lb

## 2023-11-04 DIAGNOSIS — R319 Hematuria, unspecified: Secondary | ICD-10-CM

## 2023-11-04 DIAGNOSIS — Z466 Encounter for fitting and adjustment of urinary device: Secondary | ICD-10-CM

## 2023-11-04 DIAGNOSIS — R339 Retention of urine, unspecified: Secondary | ICD-10-CM | POA: Diagnosis not present

## 2023-11-04 NOTE — Progress Notes (Signed)
 Cath Change/ Replacement  Patient is present today for a catheter change due to urinary retention.  8ml of water was removed from the balloon, a 16FR coude foley cath was removed without difficulty.  Patient was cleaned and prepped in a sterile fashion with betadine and 2% lidocaine  jelly was instilled into the urethra. A 16 FR coude foley cath was replaced into the bladder, no complications were noted. Urine return was noted 10ml and urine was yellow in color. The balloon was filled with 10ml of sterile water. A leg bag was attached for drainage. Patient tolerated well.    Performed by: Ambermarie Honeyman, PA-C   Additional notes: No significant urologic findings on CTU. I offered them cystoscopy for further evaluation of the bladder, but in shared decision making we elected to defer this unless his hematuria worsens/becomes more persistent, especially since he had benign cysto in October 2023.  He is on doxycycline  per his PCP for possible UTI with urine culture growing multiple species. Symptom was trance/AMS and Dr. Jeffie has recommended neurology follow up for possible seizure. Agree with neuro involvement. Complete Doxy given symptom improvement/resolution.  Follow up: Return in about 4 weeks (around 12/02/2023) for Catheter exchange.

## 2023-11-07 ENCOUNTER — Ambulatory Visit: Payer: Medicare Other | Admitting: Physical Therapy

## 2023-11-07 DIAGNOSIS — R269 Unspecified abnormalities of gait and mobility: Secondary | ICD-10-CM

## 2023-11-07 DIAGNOSIS — R293 Abnormal posture: Secondary | ICD-10-CM

## 2023-11-07 DIAGNOSIS — M6281 Muscle weakness (generalized): Secondary | ICD-10-CM

## 2023-11-07 DIAGNOSIS — R2689 Other abnormalities of gait and mobility: Secondary | ICD-10-CM

## 2023-11-07 NOTE — Therapy (Signed)
OUTPATIENT PHYSICAL THERAPY LOWER EXTREMITY TREATMENT  Patient Name: Mike Holloway MRN: [id number] DOB:[birth year], 83 y.o., male Today's Date: 11/07/2023  END OF SESSION:  PT End of Session - 11/07/23 1253     Visit Number 7    Number of Visits 16    Date for PT Re-Evaluation 11/29/23    PT Start Time 1248    PT Stop Time 1345    PT Time Calculation (min) 57 min    Activity Tolerance Patient tolerated treatment well            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   SIADH (syndrome of inappropriate ADH production) (HCC)    Sleep apnea    Stroke [facility])    Past Surgical History:  Procedure Laterality Date   ABLATION     APPENDECTOMY     CATARACT EXTRACTION, BILATERAL     COLONOSCOPY WITH PROPOFOL N/A 03/17/2018   Procedure: COLONOSCOPY WITH PROPOFOL;  Surgeon: [name], MD;  Location: [facility] 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   Procedure: FECAL TRANSPLANT;  Surgeon: [name], MD;  Location: [facility] ENDOSCOPY;  Service: Gastroenterology;  Laterality: N/A;   HEMORRHOID SURGERY     HERNIA REPAIR     HIP FRACTURE SURGERY     INTRAMEDULLARY (IM) NAIL INTERTROCHANTERIC Right 09/16/2018   Procedure: INTRAMEDULLARY (IM) NAIL INTERTROCHANTRIC;  Surgeon: [name], MD;  Location: [facility] ORS;  Service: Orthopedics;  Laterality: Right;   IR CATHETER TUBE CHANGE  11/22/2018   NASAL SINUS SURGERY     ORCHIECTOMY     TONSILLECTOMY     Patient Active Problem List   Diagnosis Date Noted   DVT femoral (deep venous thrombosis) with thrombophlebitis, left (HCC) 02/06/2020   Lymphedema 04/15/2019   Bilateral leg edema 01/25/2019   Hyponatremia  12/24/2018   SIADH (syndrome of inappropriate ADH production) (HCC) 12/24/2018   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) 10/27/2018   Generalized weakness 10/14/2018   Hip fracture (HCC) 09/15/2018   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   HLD (hyperlipidemia) 03/24/2018   Contusion of right knee 02/14/2018   Bradycardia 12/28/2017   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/03/2017   Tremor 09/04/2016   Chronic pain of right knee 08/10/2016   Right ankle pain 08/10/2016   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   Non-Hodgkin's lymphoma (HCC) 12/11/2015   Urinary retention 09/08/2015   Lymphoma, non-Hodgkin's (HCC) 04/03/2015   Breathlessness on exertion 11/21/2014   Breath shortness 11/21/2014   Arthropathy 11/07/2014   Atrial flutter, paroxysmal (HCC) 11/07/2014   Type 2 diabetes mellitus (HCC) 11/07/2014   Benign essential tremor 11/07/2014   Benign essential HTN 11/07/2014   Mixed incontinence 11/07/2014   Hypercholesterolemia without hypertriglyceridemia 11/07/2014   Apnea, sleep 11/07/2014   Controlled type 2 diabetes mellitus without complication (HCC) 11/07/2014   Pure hypercholesterolemia 11/07/2014   Other abnormalities of gait and mobility 11/02/2011   Decreased mobility 11/02/2011   Abnormal gait 06/29/2011   Discoordination 06/29/2011    PCP: Maryjane Hurter,  Madaline Guthrie, MD  REFERRING PROVIDER: Marina Goodell, MD  REFERRING DIAG: Gait instability  THERAPY DIAG:  Abnormal posture  Gait difficulty  Balance problem  Muscle weakness (generalized)  Rationale for Evaluation and Treatment: Rehabilitation  ONSET DATE: Chronic  SUBJECTIVE:   SUBJECTIVE STATEMENT: Pt. Known well to PT clinic.  Pt. Had a fall in August and requires assist to return to standing.  Pts. Wife reports pt. Is having increase difficulty with  standing/walking.  Pt. Using rollator for household mobility and reports limited tasks out of house.  Pt. C/o 1/10 R knee pain and no current treatment plan.  Pt. Has chronic h/o R knee pain and has tried injections/ brace in past with no benefit.  Pt. Able to walk into church with rollator but requires use of w/c when walking into [place] [employer].  Pt. Able to ascend stairs at son's house with assist and step to pattern.    PERTINENT HISTORY: See MD notes/ recent SLP and OT notes.    PAIN:  Are you having pain? Yes: NPRS scale: 1/10 Pain location: R knee Pain description: sharp/ aching Aggravating factors: standing/walking Relieving factors: sitting  PRECAUTIONS: Fall  RED FLAGS: Bowel or bladder incontinence: Yes:      WEIGHT BEARING RESTRICTIONS: No  FALLS:  Has patient fallen in last 6 months? Yes. Number of falls 1  LIVING ENVIRONMENT: Lives with: lives with their spouse Lives in: House/apartment Stairs: No Has following equipment at home: Environmental consultant - 4 wheeled and Grab bars  OCCUPATION: Retired  PLOF: Requires assistive device for independence  PATIENT GOALS: Improve LE strength/ gait/ balance.  Decrease fall risk.    NEXT MD VISIT: PRN  OBJECTIVE:  Note: Objective measures were completed at Evaluation unless otherwise noted.  PATIENT SURVEYS:  FOTO initial 29/goal 5  COGNITION: Overall cognitive status: Impaired     SENSATION: Not tested  EDEMA:  NT  MUSCLE LENGTH: NT  POSTURE: rounded shoulders and forward head  PALPATION: R knee joint line tenderness  LOWER EXTREMITY ROM:   B UE/ LE AROM WFL.  Pt. Pain limited with R knee flexion/extension OP.   LOWER EXTREMITY MMT:  MMT Right eval Left eval  Hip flexion 4- 4-  Hip extension    Hip abduction    Hip adduction    Hip internal rotation    Hip external rotation    Knee flexion 5 5  Knee extension 4 4  Ankle dorsiflexion 4 4  Ankle plantarflexion    Ankle inversion    Ankle  eversion     (Blank rows = not tested)  B shoulder: flexion 5/5, abduction 4/5, tricep 4+/5, bicep 5/5.    LOWER EXTREMITY SPECIAL TESTS:  NT  FUNCTIONAL TESTS:  5 times sit to stand: TBD 6 minute walk test: TBD Berg Balance Scale: 25/56 (significant fall risk)  GAIT: Distance walked: in clinic Assistive device utilized: Walker - 4 wheeled Level of assistance: Modified independence and SBA Comments: Antalgic gait pattern with heavy UE assist/ use of brakes on rollator.  Increase c/o R knee pain and fearful of R knee buckling.  High fall risk.  TREATMENT DATE: 11/07/2023  Subjective:    Pt. Reports no back pain but 2/10 R medial knee pain esp. With standing/walking.  Pt. Entered PT with use of rollator and heavy UE assist for safety.  Pt. Expresses that they went to church and were able to safely move around using their Rolator walker. Pt. Exclaims that their knee pain has calmed down some since their last visit and that they are looking to get a new Rolator. Pt. Spouse reported a potential seizure episode on 11/01/2023 while using restroom.  There.ex.:  Nustep L4 10 min. B LE only.  MH to low back during Nustep.  Discussed daily activity/ past weekend.  R knee increased to 3/10 at end of Nustep. RPE 5/10  Seated marching/ LAQ 20x each.  Standing hip flexion/ abduction in //-bars with UE assist required.  No knee buckling.          Standing lumbar AROM (flexion/extension/rotn.)- limited  Discussed/reviewed HEP  There.act.:  Walking into clinic from car to Nustep with use of rollator and SBA/CGA for safety an cuing to increase upright posture.  C/o R knee discomfort.    Blaze pods: standing pods touches (hip abduction/ flexion/ extension) with LE (random color FOCUS set up) 3 x 1 minute each.  Increase R knee symptoms requiring short seated rest breaks.   Walking over 6"/  12" hurdles in //-bars with focus on hip/knee flexion to clear hurdle.    //-Bar Standing boxing activity. 2 x 1 minute continuously. PT mod. Assist via gait belt for safety. RPE 8/10 and NPS 6/10 LBP.  Turning in hallway with use of rollator and in //-bars.    Amb. From //-bars to car at side of building with CGA for safety and cuing to correct posture/ step pattern.   Managing doing down outside ramp to car.    PATIENT EDUCATION:  Education details: HEP Person educated: Patient and Spouse Education method: Medical illustrator Education comprehension: verbalized understanding and returned demonstration  HOME EXERCISE PROGRAM: See handouts  ASSESSMENT:  CLINICAL IMPRESSION: Pt. Motivated to work with PT to increase LE strength/ standing tolerance. Today's treatment session focused on maintaining balance during dynamic free-standing activities. Pt. Is challenged by exercises limiting unilateral UE assistance including blaze pods forward reaching and //-bar boxing. Pt. Limited with chronic R knee pain which is exacerbated with wt. Bearing/ walking tasks.  Pt. Requires several short seated rest breaks and heavy use of B UE with sit to stands/ walking with rollator.  Pt. continues to ambulate increase distances during hallway tasks and while going to side of building/ managing ramp with rollator. Pt. Will benefit from skilled PT services to increase standing tolerance/ walking household distances with improved balance/ decrease fall risk.    OBJECTIVE IMPAIRMENTS: Abnormal gait, decreased activity tolerance, decreased balance, decreased endurance, decreased mobility, difficulty walking, decreased strength, hypomobility, improper body mechanics, postural dysfunction, and pain.   ACTIVITY LIMITATIONS: standing, stairs, transfers, and locomotion level  PARTICIPATION LIMITATIONS: community activity and church  PERSONAL FACTORS: Fitness and Past/current experiences are also affecting  patient's functional outcome.   REHAB POTENTIAL: Good  CLINICAL DECISION MAKING: Evolving/moderate complexity  EVALUATION COMPLEXITY: Moderate   GOALS: Goals reviewed with patient? Yes  SHORT TERM GOALS: Target date: 11/01/23 Pt. Will increase B hip/LE muscle strength 1/2 muscle grade to improve transfers/ standing tolerance/   Baseline:  see above Goal status: On-going   LONG TERM GOALS: Target date: 11/29/23  Pt. Will increase FOTO to 41 to improve pain-free functional  mobility.   Baseline: initial 29 Goal status: INITIAL  2.  Pt. Will increase Berg balance test score to >35/56 to decrease fall risk/ improve balance with walking.   Baseline: initial 25 (high fall risk) Goal status: INITIAL  3.  Pt. Able to ambulate from car into [facility] [employer] with use of rollator and SBA/mod. I safely to improve functional mobility.   Baseline: pt. Requires w/c with increase walking distances.  Goal status: INITIAL   PLAN:  PT FREQUENCY: 2x/week  PT DURATION: 8 weeks  PLANNED INTERVENTIONS: 97110-Therapeutic exercises, 97530- Therapeutic activity, [id number]- Neuromuscular re-education, 97535- Self Care, [id number]- Manual therapy, [phone]- Gait training, Patient/Family education, Balance training, Stair training, Joint mobilization, Cryotherapy, and Moist heat  PLAN FOR NEXT SESSION:   ISSUE REVISED HEP  Cammie Mcgee, PT, DPT # [phone] 11/07/2023, 1:45 PM   Milford Cage, SPT

## 2023-11-08 ENCOUNTER — Encounter: Payer: Self-pay | Admitting: Physical Therapy

## 2023-11-08 ENCOUNTER — Encounter: Payer: Medicare Other | Admitting: Physical Therapy

## 2023-11-08 ENCOUNTER — Ambulatory Visit (INDEPENDENT_AMBULATORY_CARE_PROVIDER_SITE_OTHER): Payer: Medicare Other | Admitting: Podiatry

## 2023-11-08 ENCOUNTER — Encounter: Payer: Self-pay | Admitting: Podiatry

## 2023-11-08 VITALS — Ht 68.0 in | Wt 214.0 lb

## 2023-11-08 DIAGNOSIS — M79674 Pain in right toe(s): Secondary | ICD-10-CM

## 2023-11-08 DIAGNOSIS — Z794 Long term (current) use of insulin: Secondary | ICD-10-CM

## 2023-11-08 DIAGNOSIS — B351 Tinea unguium: Secondary | ICD-10-CM | POA: Diagnosis not present

## 2023-11-08 DIAGNOSIS — M2041 Other hammer toe(s) (acquired), right foot: Secondary | ICD-10-CM | POA: Diagnosis not present

## 2023-11-08 DIAGNOSIS — M2042 Other hammer toe(s) (acquired), left foot: Secondary | ICD-10-CM | POA: Diagnosis not present

## 2023-11-08 DIAGNOSIS — E119 Type 2 diabetes mellitus without complications: Secondary | ICD-10-CM | POA: Diagnosis not present

## 2023-11-08 DIAGNOSIS — M79675 Pain in left toe(s): Secondary | ICD-10-CM

## 2023-11-08 NOTE — Progress Notes (Signed)
  Subjective:  Patient ID: Mike Holloway, male    DOB: 1941-01-12,  MRN: [id number]  Chief Complaint  Patient presents with   Nail Problem    Pt is here for [facility].   83 y.o. male returns for the above complaint.  Patient presents with thickened elongated showing mycotic toenails x 10 mild pain on palpation ambulate for me to be done he is unable to do it himself.  He also has secondary complaint of getting diabetic shoes.  He is a diabetic with controlled A1c.  He would like to obtain diabetic shoes given his hammertoe contractures  Objective:  There were no vitals filed for this visit. Podiatric Exam: Vascular: dorsalis pedis and posterior tibial pulses are palpable bilateral. Capillary return is immediate. Temperature gradient is WNL. Skin turgor WNL  Sensorium: Normal Semmes Weinstein monofilament test. Normal tactile sensation bilaterally. Nail Exam: Pt has thick disfigured discolored nails with subungual debris noted bilateral entire nail hallux through fifth toenails.  Pain on palpation to the nails. Ulcer Exam: There is no evidence of ulcer or pre-ulcerative changes or infection. Orthopedic Exam: Muscle tone and strength are WNL. No limitations in general ROM. No crepitus or effusions noted.  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 No infection or ulcers    Assessment & Plan:   No diagnosis found.   Patient was evaluated and treated and all questions answered.  Hammertoe bilateral with underlying diabetes -All questions and concerns were discussed with the patient in extensive detail -Given the deformity and the contracture of the hammertoe in the setting of diabetes patient will benefit from diabetic shoes.  He will be scheduled to see Trish for diabetic shoes  Onychomycosis with pain  -Nails palliatively debrided as below. -Educated on self-care  Procedure: Nail  Debridement Rationale: pain  Type of Debridement: manual, sharp debridement. Instrumentation: Nail nipper, rotary burr. Number of Nails: 10  Procedures and Treatment: Consent by patient was obtained for treatment procedures. The patient understood the discussion of treatment and procedures well. All questions were answered thoroughly reviewed. Debridement of mycotic and hypertrophic toenails, 1 through 5 bilateral and clearing of subungual debris. No ulceration, no infection noted.  Return Visit-Office Procedure: Patient instructed to return to the office for a follow up visit 3 months for continued evaluation and treatment.  Nicholes Rough, DPM    No follow-ups on file.

## 2023-11-10 ENCOUNTER — Ambulatory Visit: Payer: Medicare Other | Admitting: Physical Therapy

## 2023-11-10 ENCOUNTER — Encounter: Payer: Self-pay | Admitting: Physical Therapy

## 2023-11-10 DIAGNOSIS — M6281 Muscle weakness (generalized): Secondary | ICD-10-CM

## 2023-11-10 DIAGNOSIS — R269 Unspecified abnormalities of gait and mobility: Secondary | ICD-10-CM | POA: Diagnosis not present

## 2023-11-10 DIAGNOSIS — R2689 Other abnormalities of gait and mobility: Secondary | ICD-10-CM

## 2023-11-10 DIAGNOSIS — R293 Abnormal posture: Secondary | ICD-10-CM

## 2023-11-15 NOTE — Therapy (Signed)
OUTPATIENT PHYSICAL THERAPY LOWER EXTREMITY TREATMENT  Patient Name: Mike Holloway MRN: [id number] DOB:[birth year], 83 y.o., male Today's Date: 11/10/2023  END OF SESSION:   PT End of Session - 11/11/23 1259       Visit Number 8     Number of Visits 16     Date for PT Re-Evaluation 11/29/23     PT Start Time 1646     PT Stop Time 1737     PT Time Calculation (min) 51 min     Activity Tolerance Patient tolerated treatment well      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   SIADH (syndrome of inappropriate ADH production) (HCC)    Sleep apnea    Stroke [facility])    Past Surgical History:  Procedure Laterality Date   ABLATION     APPENDECTOMY     CATARACT EXTRACTION, BILATERAL     COLONOSCOPY WITH PROPOFOL N/A 03/17/2018   Procedure: COLONOSCOPY WITH PROPOFOL;  Surgeon: [name], MD;  Location: [facility] 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   Procedure: FECAL TRANSPLANT;  Surgeon: [name], MD;  Location: [facility] ENDOSCOPY;  Service: Gastroenterology;  Laterality: N/A;   HEMORRHOID SURGERY     HERNIA REPAIR     HIP FRACTURE SURGERY     INTRAMEDULLARY (IM) NAIL INTERTROCHANTERIC Right 09/16/2018   Procedure: INTRAMEDULLARY (IM) NAIL INTERTROCHANTRIC;  Surgeon: [name], MD;  Location: [facility] ORS;  Service: Orthopedics;  Laterality: Right;   IR CATHETER TUBE CHANGE  11/22/2018   NASAL SINUS SURGERY     ORCHIECTOMY     TONSILLECTOMY     Patient Active Problem List   Diagnosis Date Noted   DVT femoral (deep venous thrombosis) with thrombophlebitis, left (HCC) 02/06/2020   Lymphedema 04/15/2019   Bilateral leg edema 01/25/2019   Hyponatremia  12/24/2018   SIADH (syndrome of inappropriate ADH production) (HCC) 12/24/2018   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) 10/27/2018   Generalized weakness 10/14/2018   Hip fracture (HCC) 09/15/2018   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   HLD (hyperlipidemia) 03/24/2018   Contusion of right knee 02/14/2018   Bradycardia 12/28/2017   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   Tremor 09/04/2016   Chronic pain of right knee 08/10/2016   Right ankle pain 08/10/2016   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   Non-Hodgkin's lymphoma (HCC) 12/11/2015   Urinary retention 09/08/2015   Lymphoma, non-Hodgkin's (HCC) 04/03/2015   Breathlessness on exertion 11/21/2014   Breath shortness 11/21/2014   Arthropathy 11/07/2014   Atrial flutter, paroxysmal (HCC) 11/07/2014   Type 2 diabetes mellitus (HCC) 11/07/2014   Benign essential tremor 11/07/2014   Benign essential HTN 11/07/2014   Mixed incontinence 11/07/2014   Hypercholesterolemia without hypertriglyceridemia 11/07/2014   Apnea, sleep 11/07/2014   Controlled type 2 diabetes mellitus without complication (HCC) 11/07/2014   Pure hypercholesterolemia 11/07/2014   Other abnormalities of gait and mobility 11/02/2011   Decreased mobility 11/02/2011   Abnormal gait 06/29/2011   Discoordination 06/29/2011  PCP: Marina Goodell, MD  REFERRING PROVIDER: Marina Goodell, MD  REFERRING DIAG: Gait instability  THERAPY DIAG:  Abnormal posture  Gait difficulty  Balance problem  Muscle weakness (generalized)  Rationale for Evaluation and Treatment: Rehabilitation  ONSET DATE: Chronic  SUBJECTIVE:   SUBJECTIVE STATEMENT: Pt. Known well to PT clinic.  Pt. Had a fall in August and requires assist to return to standing.  Pts. Wife reports pt. Is having increase difficulty with  standing/walking.  Pt. Using rollator for household mobility and reports limited tasks out of house.  Pt. C/o 1/10 R knee pain and no current treatment plan.  Pt. Has chronic h/o R knee pain and has tried injections/ brace in past with no benefit.  Pt. Able to walk into church with rollator but requires use of w/c when walking into [place] [employer].  Pt. Able to ascend stairs at son's house with assist and step to pattern.    PERTINENT HISTORY: See MD notes/ recent SLP and OT notes.    PAIN:  Are you having pain? Yes: NPRS scale: 1/10 Pain location: R knee Pain description: sharp/ aching Aggravating factors: standing/walking Relieving factors: sitting  PRECAUTIONS: Fall  RED FLAGS: Bowel or bladder incontinence: Yes:      WEIGHT BEARING RESTRICTIONS: No  FALLS:  Has patient fallen in last 6 months? Yes. Number of falls 1  LIVING ENVIRONMENT: Lives with: lives with their spouse Lives in: House/apartment Stairs: No Has following equipment at home: Environmental consultant - 4 wheeled and Grab bars  OCCUPATION: Retired  PLOF: Requires assistive device for independence  PATIENT GOALS: Improve LE strength/ gait/ balance.  Decrease fall risk.    NEXT MD VISIT: PRN  OBJECTIVE:  Note: Objective measures were completed at Evaluation unless otherwise noted.  PATIENT SURVEYS:  FOTO initial 29/goal 93  COGNITION: Overall cognitive status: Impaired     SENSATION: Not tested  EDEMA:  NT  MUSCLE LENGTH: NT  POSTURE: rounded shoulders and forward head  PALPATION: R knee joint line tenderness  LOWER EXTREMITY ROM:   B UE/ LE AROM WFL.  Pt. Pain limited with R knee flexion/extension OP.   LOWER EXTREMITY MMT:  MMT Right eval Left eval  Hip flexion 4- 4-  Hip extension    Hip abduction    Hip adduction    Hip internal rotation    Hip external rotation    Knee flexion 5 5  Knee extension 4 4  Ankle dorsiflexion 4 4  Ankle plantarflexion    Ankle inversion    Ankle  eversion     (Blank rows = not tested)  B shoulder: flexion 5/5, abduction 4/5, tricep 4+/5, bicep 5/5.    LOWER EXTREMITY SPECIAL TESTS:  NT  FUNCTIONAL TESTS:  5 times sit to stand: TBD 6 minute walk test: TBD Berg Balance Scale: 25/56 (significant fall risk)  GAIT: Distance walked: in clinic Assistive device utilized: Walker - 4 wheeled Level of assistance: Modified independence and SBA Comments: Antalgic gait pattern with heavy UE assist/ use of brakes on rollator.  Increase c/o R knee pain and fearful of R knee buckling.  High fall risk.  TREATMENT DATE: 11/10/2023  Subjective:    Pt. Reports no back pain but 2/10 R medial knee pain esp. With standing/walking.  Pt. Entered PT with use of rollator and heavy UE assist for safety.  Pt. Expresses that they went to church and were able to safely move around using their Rolator walker. Pt. Exclaims that their knee pain has calmed down some since their last visit and that they are looking to get a new Rolator. Pt. Spouse reported a potential seizure episode on 11/01/2023 while using restroom.  There.ex.:  Nustep L4 10 min. B LE only.  MH to low back during Nustep.  Discussed daily activity/ past weekend.  R knee increased to 3/10 at end of Nustep. RPE 5/10  Seated marching/ LAQ 20x each.  Standing hip flexion/ abduction in //-bars with UE assist required.  No knee buckling.          Standing lumbar AROM (flexion/extension/rotn.)- limited  Discussed/reviewed HEP  There.act.:  Walking into clinic from car to [place] with use of rollator and SBA/CGA for safety an cuing to increase upright posture.  C/o R knee discomfort.    Blaze pods: standing pods touches (hip abduction/ flexion/ extension) with LE (random color FOCUS set up) 3 x 1 minute each.  Increase R knee symptoms requiring short seated rest breaks.   Walking over 6"/  12" hurdles in //-bars with focus on hip/knee flexion to clear hurdle.    //-Bar Standing boxing activity. 2 x 1 minute continuously. PT mod. Assist via gait belt for safety. RPE 8/10 and NPS 6/10 LBP.  Turning in hallway with use of rollator and in //-bars.    Amb. From //-bars to car at side of building with CGA for safety and cuing to correct posture/ step pattern.   Managing doing down outside ramp to car.    PATIENT EDUCATION:  Education details: HEP Person educated: Patient and Spouse Education method: Medical illustrator Education comprehension: verbalized understanding and returned demonstration  HOME EXERCISE PROGRAM: See handouts  ASSESSMENT:  CLINICAL IMPRESSION: Pt. Motivated to work with PT to increase LE strength/ standing tolerance. Today's treatment session focused on maintaining balance during dynamic free-standing activities. Pt. Is challenged by exercises limiting unilateral UE assistance including blaze pods forward reaching and //-bar boxing. Pt. Limited with chronic R knee pain which is exacerbated with wt. Bearing/ walking tasks.  Pt. Requires several short seated rest breaks and heavy use of B UE with sit to stands/ walking with rollator.  Pt. continues to ambulate increase distances during hallway tasks and while going to side of building/ managing ramp with rollator. Pt. Will benefit from skilled PT services to increase standing tolerance/ walking household distances with improved balance/ decrease fall risk.    OBJECTIVE IMPAIRMENTS: Abnormal gait, decreased activity tolerance, decreased balance, decreased endurance, decreased mobility, difficulty walking, decreased strength, hypomobility, improper body mechanics, postural dysfunction, and pain.   ACTIVITY LIMITATIONS: standing, stairs, transfers, and locomotion level  PARTICIPATION LIMITATIONS: community activity and church  PERSONAL FACTORS: Fitness and Past/current experiences are also affecting  patient's functional outcome.   REHAB POTENTIAL: Good  CLINICAL DECISION MAKING: Evolving/moderate complexity  EVALUATION COMPLEXITY: Moderate   GOALS: Goals reviewed with patient? Yes  SHORT TERM GOALS: Target date: 11/01/23 Pt. Will increase B hip/LE muscle strength 1/2 muscle grade to improve transfers/ standing tolerance/   Baseline:  see above Goal status: On-going   LONG TERM GOALS: Target date: 11/29/23  Pt. Will increase FOTO to 41 to improve pain-free functional  mobility.   Baseline: initial 29 Goal status: INITIAL  2.  Pt. Will increase Berg balance test score to >35/56 to decrease fall risk/ improve balance with walking.   Baseline: initial 25 (high fall risk) Goal status: INITIAL  3.  Pt. Able to ambulate from car into [facility] [employer] with use of rollator and SBA/mod. I safely to improve functional mobility.   Baseline: pt. Requires w/c with increase walking distances.  Goal status: INITIAL   PLAN:  PT FREQUENCY: 2x/week  PT DURATION: 8 weeks  PLANNED INTERVENTIONS: 97110-Therapeutic exercises, 97530- Therapeutic activity, [id number]- Neuromuscular re-education, 97535- Self Care, 21308- Manual therapy, [phone]- Gait training, Patient/Family education, Balance training, Stair training, Joint mobilization, Cryotherapy, and Moist heat  PLAN FOR NEXT SESSION:   ISSUE REVISED HEP  Cammie Mcgee, PT, DPT # [phone] 11/15/2023, 7:57 AM   Milford Cage, SPT

## 2023-11-17 ENCOUNTER — Ambulatory Visit: Payer: Medicare Other | Admitting: Physical Therapy

## 2023-11-21 ENCOUNTER — Ambulatory Visit: Payer: Medicare Other | Admitting: Physical Therapy

## 2023-11-21 ENCOUNTER — Encounter: Payer: Self-pay | Admitting: Physical Therapy

## 2023-11-21 DIAGNOSIS — R293 Abnormal posture: Secondary | ICD-10-CM

## 2023-11-21 DIAGNOSIS — R269 Unspecified abnormalities of gait and mobility: Secondary | ICD-10-CM

## 2023-11-21 DIAGNOSIS — M6281 Muscle weakness (generalized): Secondary | ICD-10-CM

## 2023-11-21 DIAGNOSIS — R2689 Other abnormalities of gait and mobility: Secondary | ICD-10-CM

## 2023-11-21 NOTE — Therapy (Signed)
 OUTPATIENT PHYSICAL THERAPY LOWER EXTREMITY TREATMENT  Patient Name: Mike Holloway MRN: [id number] DOB:1940-11-15, 83 y.o., male Today's Date: 11/21/2023  END OF SESSION:  PT End of Session - 11/21/23 1442     Visit Number 9    Number of Visits 16    Date for PT Re-Evaluation 11/29/23    PT Start Time 1428    PT Stop Time 1526    PT Time Calculation (min) 58 min    Activity Tolerance Patient tolerated treatment well             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   SIADH (syndrome of inappropriate ADH production) (HCC)    Sleep apnea    Stroke [facility])    Past Surgical History:  Procedure Laterality Date   ABLATION     APPENDECTOMY     CATARACT EXTRACTION, BILATERAL     COLONOSCOPY WITH PROPOFOL N/A 03/17/2018   Procedure: COLONOSCOPY WITH PROPOFOL;  Surgeon: [name], MD;  Location: [facility] 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   Procedure: FECAL TRANSPLANT;  Surgeon: [name], MD;  Location: [facility] ENDOSCOPY;  Service: Gastroenterology;  Laterality: N/A;   HEMORRHOID SURGERY     HERNIA REPAIR     HIP FRACTURE SURGERY     INTRAMEDULLARY (IM) NAIL INTERTROCHANTERIC Right 09/16/2018   Procedure: INTRAMEDULLARY (IM) NAIL INTERTROCHANTRIC;  Surgeon: [name], MD;  Location: [facility] ORS;  Service: Orthopedics;  Laterality: Right;   IR CATHETER TUBE CHANGE  11/22/2018   NASAL SINUS SURGERY     ORCHIECTOMY     TONSILLECTOMY     Patient Active Problem List   Diagnosis Date Noted   DVT femoral (deep venous thrombosis) with thrombophlebitis, left (HCC) 02/06/2020   Lymphedema 04/15/2019   Bilateral leg edema 01/25/2019   Hyponatremia  12/24/2018   SIADH (syndrome of inappropriate ADH production) (HCC) 12/24/2018   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) 10/27/2018   Generalized weakness 10/14/2018   Hip fracture (HCC) 09/15/2018   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   HLD (hyperlipidemia) 03/24/2018   Contusion of right knee 02/14/2018   Bradycardia 12/28/2017   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   Tremor 09/04/2016   Chronic pain of right knee 08/10/2016   Right ankle pain 08/10/2016   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   Non-Hodgkin's lymphoma (HCC) 12/11/2015   Urinary retention 09/08/2015   Lymphoma, non-Hodgkin's (HCC) 04/03/2015   Breathlessness on exertion 11/21/2014   Breath shortness 11/21/2014   Arthropathy 11/07/2014   Atrial flutter, paroxysmal (HCC) 11/07/2014   Type 2 diabetes mellitus (HCC) 11/07/2014   Benign essential tremor 11/07/2014   Benign essential HTN 11/07/2014   Mixed incontinence 11/07/2014   Hypercholesterolemia without hypertriglyceridemia 11/07/2014   Apnea, sleep 11/07/2014   Controlled type 2 diabetes mellitus without complication (HCC) 11/07/2014   Pure hypercholesterolemia 11/07/2014   Other abnormalities of gait and mobility 11/02/2011   Decreased mobility 11/02/2011   Abnormal gait 06/29/2011   Discoordination 06/29/2011   PCP: Maryjane Hurter,  Madaline Guthrie, MD  REFERRING PROVIDER: Marina Goodell, MD  REFERRING DIAG: Gait instability  THERAPY DIAG:  Abnormal posture  Gait difficulty  Balance problem  Muscle weakness (generalized)  Rationale for Evaluation and Treatment: Rehabilitation  ONSET DATE: Chronic  SUBJECTIVE:   SUBJECTIVE STATEMENT: Pt. Known well to PT clinic.  Pt. Had a fall in August and requires assist to return to standing.  Pts. Wife reports pt. Is having increase difficulty with  standing/walking.  Pt. Using rollator for household mobility and reports limited tasks out of house.  Pt. C/o 1/10 R knee pain and no current treatment plan.  Pt. Has chronic h/o R knee pain and has tried injections/ brace in past with no benefit.  Pt. Able to walk into church with rollator but requires use of w/c when walking into [place] [employer].  Pt. Able to ascend stairs at son's house with assist and step to pattern.    PERTINENT HISTORY: See MD notes/ recent SLP and OT notes.    PAIN:  Are you having pain? Yes: NPRS scale: 1/10 Pain location: R knee Pain description: sharp/ aching Aggravating factors: standing/walking Relieving factors: sitting  PRECAUTIONS: Fall  RED FLAGS: Bowel or bladder incontinence: Yes:      WEIGHT BEARING RESTRICTIONS: No  FALLS:  Has patient fallen in last 6 months? Yes. Number of falls 1  LIVING ENVIRONMENT: Lives with: lives with their spouse Lives in: House/apartment Stairs: No Has following equipment at home: Environmental consultant - 4 wheeled and Grab bars  OCCUPATION: Retired  PLOF: Requires assistive device for independence  PATIENT GOALS: Improve LE strength/ gait/ balance.  Decrease fall risk.    NEXT MD VISIT: PRN  OBJECTIVE:  Note: Objective measures were completed at Evaluation unless otherwise noted.  PATIENT SURVEYS:  FOTO initial 29/goal 55  COGNITION: Overall cognitive status: Impaired     SENSATION: Not tested  EDEMA:  NT  MUSCLE LENGTH: NT  POSTURE: rounded shoulders and forward head  PALPATION: R knee joint line tenderness  LOWER EXTREMITY ROM:   B UE/ LE AROM WFL.  Pt. Pain limited with R knee flexion/extension OP.   LOWER EXTREMITY MMT:  MMT Right eval Left eval  Hip flexion 4- 4-  Hip extension    Hip abduction    Hip adduction    Hip internal rotation    Hip external rotation    Knee flexion 5 5  Knee extension 4 4  Ankle dorsiflexion 4 4  Ankle plantarflexion    Ankle inversion    Ankle  eversion     (Blank rows = not tested)  B shoulder: flexion 5/5, abduction 4/5, tricep 4+/5, bicep 5/5.    LOWER EXTREMITY SPECIAL TESTS:  NT  FUNCTIONAL TESTS:  5 times sit to stand: TBD 6 minute walk test: TBD Berg Balance Scale: 25/56 (significant fall risk)  GAIT: Distance walked: in clinic Assistive device utilized: Walker - 4 wheeled Level of assistance: Modified independence and SBA Comments: Antalgic gait pattern with heavy UE assist/ use of brakes on rollator.  Increase c/o R knee pain and fearful of R knee buckling.  High fall risk.  TREATMENT DATE: 11/21/2023  Subjective:    Pt. Entered PT with use of rollator and heavy UE assist for safety.  PT discussed various types of rollators with pt./ wife to improve pts. Mobility (increase wheel size, light wt., higher frame).   There.ex.: No charge  Nustep L4 10 min. B LE only.  MH to low back during Nustep.  Discussed daily activity/ R knee and low back symptoms.    Neuro:   Standing wt. Shifting in //-bars with min. To no UE assist.   Sit to stands from chair with use of arm rests 10x.    [employer] step ups/ downs in //-bars (UE assist/ SBA for safety).  Tandem staggered stance on 2 [employer] pads.    Discussed/reviewed HEP  There.act.:  Walking into clinic from car to [employer] with use of rollator and SBA/CGA for safety an cuing to increase upright posture.  C/o R knee discomfort.    Blaze pods: standing pods UE touches/ boxing gloves with random color FOCUS set up 2 x 2 minute each.  Increase fatigue requiring short seated rest breaks.  SBA for safety in //-bars.   Amb. From //-bars to car at side of building with CGA for safety and cuing to correct posture/ step pattern.   Managing doing down outside ramp to car.    PATIENT EDUCATION:  Education details: HEP Person educated: Patient and Spouse Education method:  Medical illustrator Education comprehension: verbalized understanding and returned demonstration  HOME EXERCISE PROGRAM: See handouts  ASSESSMENT:  CLINICAL IMPRESSION: Pt. Motivated to work with PT to increase LE strength/ standing tolerance. Today's treatment session focused on maintaining balance during dynamic free-standing activities. Pt. Is challenged by exercises limiting unilateral UE assistance including blaze pods forward reaching and wt. shifting. Pt. Limited with chronic R knee pain which is exacerbated with wt. Bearing/ walking tasks.  Pt. Requires several short seated rest breaks and heavy use of B UE with sit to stands/ walking with rollator.  Pt. continues to ambulate increase distances during hallway tasks and while going to side of building/ managing ramp with rollator. Pt. Will benefit from skilled PT services to increase standing tolerance/ walking household distances with improved balance/ decrease fall risk.    OBJECTIVE IMPAIRMENTS: Abnormal gait, decreased activity tolerance, decreased balance, decreased endurance, decreased mobility, difficulty walking, decreased strength, hypomobility, improper body mechanics, postural dysfunction, and pain.   ACTIVITY LIMITATIONS: standing, stairs, transfers, and locomotion level  PARTICIPATION LIMITATIONS: community activity and church  PERSONAL FACTORS: Fitness and Past/current experiences are also affecting patient's functional outcome.   REHAB POTENTIAL: Good  CLINICAL DECISION MAKING: Evolving/moderate complexity  EVALUATION COMPLEXITY: Moderate   GOALS: Goals reviewed with patient? Yes  SHORT TERM GOALS: Target date: 11/01/23 Pt. Will increase B hip/LE muscle strength 1/2 muscle grade to improve transfers/ standing tolerance/   Baseline:  see above Goal status: On-going   LONG TERM GOALS: Target date: 11/29/23  Pt. Will increase FOTO to 41 to improve pain-free functional mobility.   Baseline: initial  29 Goal status: INITIAL  2.  Pt. Will increase Berg balance test score to >35/56 to decrease fall risk/ improve balance with walking.   Baseline: initial 25 (high fall risk) Goal status: INITIAL  3.  Pt. Able to ambulate from car into [facility] [employer] with use of rollator and SBA/mod. I safely to improve functional mobility.   Baseline: pt. Requires w/c with increase walking distances.  Goal status: INITIAL   PLAN:  PT FREQUENCY: 2x/week  PT DURATION: 8 weeks  PLANNED INTERVENTIONS: 97110-Therapeutic exercises, 97530- Therapeutic activity, [id number]- Neuromuscular re-education, 97535- Self Care, 16109- Manual therapy, [phone]- Gait training, Patient/Family education, Balance training, Stair training, Joint mobilization, Cryotherapy, and Moist heat  PLAN FOR NEXT SESSION:   ISSUE REVISED HEP  Cammie Mcgee, PT, DPT # [phone] 11/21/2023, 3:37 PM   Milford Cage, SPT

## 2023-11-24 ENCOUNTER — Ambulatory Visit: Payer: Medicare Other | Admitting: Physical Therapy

## 2023-11-24 DIAGNOSIS — R2689 Other abnormalities of gait and mobility: Secondary | ICD-10-CM

## 2023-11-24 DIAGNOSIS — R269 Unspecified abnormalities of gait and mobility: Secondary | ICD-10-CM | POA: Diagnosis not present

## 2023-11-24 DIAGNOSIS — R293 Abnormal posture: Secondary | ICD-10-CM

## 2023-11-24 DIAGNOSIS — M6281 Muscle weakness (generalized): Secondary | ICD-10-CM

## 2023-11-24 DIAGNOSIS — R278 Other lack of coordination: Secondary | ICD-10-CM

## 2023-11-24 NOTE — Therapy (Unsigned)
 OUTPATIENT PHYSICAL THERAPY LOWER EXTREMITY TREATMENT Physical Therapy Progress Note  Dates of reporting period  10/04/23   to   11/24/23   Patient Name: Mike Holloway MRN: [id number] DOB:September 02, 1941, 83 y.o., male Today's Date: 11/24/2023  END OF SESSION:  PT End of Session - 11/24/23 1439     Visit Number 10    Number of Visits 16    Date for PT Re-Evaluation 11/29/23    PT Start Time 1428    PT Stop Time 1518    PT Time Calculation (min) 50 min    Activity Tolerance Patient tolerated treatment well             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   SIADH (syndrome of inappropriate ADH production) (HCC)    Sleep apnea    Stroke [facility])    Past Surgical History:  Procedure Laterality Date   ABLATION     APPENDECTOMY     CATARACT EXTRACTION, BILATERAL     COLONOSCOPY WITH PROPOFOL N/A 03/17/2018   Procedure: COLONOSCOPY WITH PROPOFOL;  Surgeon: [name], MD;  Location: [facility] 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   Procedure: FECAL TRANSPLANT;  Surgeon: [name], MD;  Location: [facility] ENDOSCOPY;  Service: Gastroenterology;  Laterality: N/A;   HEMORRHOID SURGERY     HERNIA REPAIR     HIP FRACTURE SURGERY     INTRAMEDULLARY (IM) NAIL INTERTROCHANTERIC Right 09/16/2018   Procedure: INTRAMEDULLARY (IM) NAIL INTERTROCHANTRIC;  Surgeon: [name], MD;  Location: [facility] ORS;  Service: Orthopedics;  Laterality: Right;   IR CATHETER TUBE CHANGE  11/22/2018   NASAL SINUS SURGERY     ORCHIECTOMY     TONSILLECTOMY     Patient Active Problem List   Diagnosis Date Noted   DVT femoral (deep venous thrombosis) with thrombophlebitis, left (HCC)  02/06/2020   Lymphedema 04/15/2019   Bilateral leg edema 01/25/2019   Hyponatremia 12/24/2018   SIADH (syndrome of inappropriate ADH production) (HCC) 12/24/2018   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) 10/27/2018   Generalized weakness 10/14/2018   Hip fracture (HCC) 09/15/2018   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   HLD (hyperlipidemia) 03/24/2018   Contusion of right knee 02/14/2018   Bradycardia 12/28/2017   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/03/2017   Tremor 09/04/2016   Chronic pain of right knee 08/10/2016   Right ankle pain 08/10/2016   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   Non-Hodgkin's lymphoma (HCC) 12/11/2015   Urinary retention 09/08/2015   Lymphoma, non-Hodgkin's (HCC) 04/03/2015   Breathlessness on exertion 11/21/2014   Breath shortness 11/21/2014   Arthropathy 11/07/2014   Atrial flutter, paroxysmal (HCC) 11/07/2014   Type 2 diabetes mellitus (HCC) 11/07/2014   Benign essential tremor 11/07/2014   Benign essential HTN 11/07/2014   Mixed incontinence 11/07/2014   Hypercholesterolemia without hypertriglyceridemia 11/07/2014   Apnea, sleep 11/07/2014   Controlled type 2 diabetes mellitus without complication (HCC) 11/07/2014   Pure hypercholesterolemia 11/07/2014   Other abnormalities of gait and mobility 11/02/2011  Decreased mobility 11/02/2011   Abnormal gait 06/29/2011   Discoordination 06/29/2011   PCP: Marina Goodell, MD  REFERRING PROVIDER: Marina Goodell, MD  REFERRING DIAG: Gait instability  THERAPY DIAG:  Abnormal posture  Gait difficulty  Balance problem  Muscle weakness (generalized)  Other lack of coordination  Rationale for Evaluation and Treatment: Rehabilitation  ONSET DATE: Chronic  SUBJECTIVE:   SUBJECTIVE STATEMENT: Pt. Known well to PT clinic.  Pt. Had a fall in August  and requires assist to return to standing.  Pts. Wife reports pt. Is having increase difficulty with standing/walking.  Pt. Using rollator for household mobility and reports limited tasks out of house.  Pt. C/o 1/10 R knee pain and no current treatment plan.  Pt. Has chronic h/o R knee pain and has tried injections/ brace in past with no benefit.  Pt. Able to walk into church with rollator but requires use of w/c when walking into [employer].  Pt. Able to ascend stairs at son's house with assist and step to pattern.    PERTINENT HISTORY: See MD notes/ recent SLP and OT notes.    PAIN:  Are you having pain? Yes: NPRS scale: 1/10 Pain location: R knee Pain description: sharp/ aching Aggravating factors: standing/walking Relieving factors: sitting  PRECAUTIONS: Fall  RED FLAGS: Bowel or bladder incontinence: Yes:      WEIGHT BEARING RESTRICTIONS: No  FALLS:  Has patient fallen in last 6 months? Yes. Number of falls 1  LIVING ENVIRONMENT: Lives with: lives with their spouse Lives in: House/apartment Stairs: No Has following equipment at home: Environmental consultant - 4 wheeled and Grab bars  OCCUPATION: Retired  PLOF: Requires assistive device for independence  PATIENT GOALS: Improve LE strength/ gait/ balance.  Decrease fall risk.    NEXT MD VISIT: PRN  OBJECTIVE:  Note: Objective measures were completed at Evaluation unless otherwise noted.  PATIENT SURVEYS:  FOTO initial 29/goal 57  COGNITION: Overall cognitive status: Impaired     SENSATION: Not tested  EDEMA:  NT  MUSCLE LENGTH: NT  POSTURE: rounded shoulders and forward head  PALPATION: R knee joint line tenderness  LOWER EXTREMITY ROM:   B UE/ LE AROM WFL.  Pt. Pain limited with R knee flexion/extension OP.   LOWER EXTREMITY MMT:  MMT Right eval Left eval  Hip flexion 4- 4-  Hip extension    Hip abduction    Hip adduction    Hip internal rotation    Hip external rotation    Knee flexion 5  5  Knee extension 4 4  Ankle dorsiflexion 4 4  Ankle plantarflexion    Ankle inversion    Ankle eversion     (Blank rows = not tested)  B shoulder: flexion 5/5, abduction 4/5, tricep 4+/5, bicep 5/5.    LOWER EXTREMITY SPECIAL TESTS:  NT  FUNCTIONAL TESTS:  5 times sit to stand: TBD 6 minute walk test: TBD Berg Balance Scale: 25/56 (significant fall risk)  GAIT: Distance walked: in clinic Assistive device utilized: Walker - 4 wheeled Level of assistance: Modified independence and SBA Comments: Antalgic gait pattern with heavy UE assist/ use of brakes on rollator.  Increase c/o R knee pain and fearful of R knee buckling.  High fall risk.  TREATMENT DATE: 11/24/2023  Subjective:    Pt. Entered PT with use of rollator and heavy UE assist for safety.  PT put pts. New rollator together and sized to highest position and adjusted brakes  There.ex.:   5# ankle wts.: walking in //-bars forward/ backwards/ lateral 2 laps each.   Seated LAQ/ heel and toe raises 20x each.    Nustep L4 10 min. B LE only.  MH to low back during Nustep.  Discussed daily activity/ R knee and low back symptoms.    There.act.:  Standing tolerance (fishing) inside of //-bars with no UE assist on //-bars until end of task (1x).   Increase c/o back discomfort with standing >3 minutes.    Blaze pods: standing pods UE touches (blue on L/ red on R) with random color FOCUS set up 2 minute each.  No rest break between sets. SBA for safety in //-bars.   Standing in //-bars: yellow ball toss with turning to R/L.    Standing basketball shots in //-bars (hoop on mirror).    Walking into clinic from car to //-bares with use of new rollator and SBA/CGA for safety an cuing to increase upright posture.  C/o R knee discomfort.    Amb. From Nustep to car with CGA for safety and cuing to correct posture/ step  pattern.   Managing doing down outside ramp to car.   PATIENT EDUCATION:  Education details: HEP Person educated: Patient and Spouse Education method: Medical illustrator Education comprehension: verbalized understanding and returned demonstration  HOME EXERCISE PROGRAM: See handouts  ASSESSMENT:  CLINICAL IMPRESSION: Pt. Motivated to work with PT to increase LE strength/ standing tolerance. Today's treatment session focused on maintaining balance during dynamic free-standing activities. Pt. Is challenged by exercises limiting unilateral UE assistance including blaze pods forward reaching and wt. shifting.  Pt. Requires several short seated rest breaks and heavy use of B UE with sit to stands/ walking with rollator.  Pt. continues to ambulate increase distances during hallway tasks and while going to side of building/ managing ramp with rollator. Pt. Will benefit from skilled PT services to increase standing tolerance/ walking household distances with improved balance/ decrease fall risk.    OBJECTIVE IMPAIRMENTS: Abnormal gait, decreased activity tolerance, decreased balance, decreased endurance, decreased mobility, difficulty walking, decreased strength, hypomobility, improper body mechanics, postural dysfunction, and pain.   ACTIVITY LIMITATIONS: standing, stairs, transfers, and locomotion level  PARTICIPATION LIMITATIONS: community activity and church  PERSONAL FACTORS: Fitness and Past/current experiences are also affecting patient's functional outcome.   REHAB POTENTIAL: Good  CLINICAL DECISION MAKING: Evolving/moderate complexity  EVALUATION COMPLEXITY: Moderate   GOALS: Goals reviewed with patient? Yes  SHORT TERM GOALS: Target date: 11/01/23 Pt. Will increase B hip/LE muscle strength 1/2 muscle grade to improve transfers/ standing tolerance/   Baseline:  see above Goal status: On-going   LONG TERM GOALS: Target date: 11/29/23  Pt. Will increase FOTO to 41 to  improve pain-free functional mobility.   Baseline: initial 29 Goal status: Not able to assess  2.  Pt. Will increase Berg balance test score to >35/56 to decrease fall risk/ improve balance with walking.   Baseline: initial 25 (high fall risk) Goal status: On-going  3.  Pt. Able to ambulate from car into [facility] [employer] with use of rollator and SBA/mod. I safely to improve functional mobility.   Baseline: pt. Requires w/c with increase walking distances.  Goal status: Not met   PLAN:  PT FREQUENCY: 2x/week  PT DURATION: 8 weeks  PLANNED INTERVENTIONS: 97110-Therapeutic exercises, 97530- Therapeutic activity, [id number]- Neuromuscular re-education, 97535- Self Care, 16109- Manual therapy, [phone]- Gait training, Patient/Family education, Balance training, Stair training, Joint mobilization, Cryotherapy, and Moist heat  PLAN FOR NEXT SESSION:  Discuss POC  Cammie Mcgee, PT, DPT # [phone] 11/24/2023, 2:41 PM   Milford Cage, SPT

## 2023-11-30 ENCOUNTER — Telehealth: Payer: Self-pay

## 2023-11-30 NOTE — Telephone Encounter (Signed)
 Left message with Care taker, she said his wife will call back so we can reschedule

## 2023-12-01 ENCOUNTER — Encounter: Payer: Self-pay | Admitting: Physical Therapy

## 2023-12-01 ENCOUNTER — Ambulatory Visit: Payer: Medicare Other | Attending: Family Medicine | Admitting: Physical Therapy

## 2023-12-01 DIAGNOSIS — M6281 Muscle weakness (generalized): Secondary | ICD-10-CM | POA: Diagnosis present

## 2023-12-01 DIAGNOSIS — R278 Other lack of coordination: Secondary | ICD-10-CM | POA: Insufficient documentation

## 2023-12-01 DIAGNOSIS — R269 Unspecified abnormalities of gait and mobility: Secondary | ICD-10-CM | POA: Insufficient documentation

## 2023-12-01 DIAGNOSIS — R2689 Other abnormalities of gait and mobility: Secondary | ICD-10-CM | POA: Insufficient documentation

## 2023-12-01 DIAGNOSIS — R293 Abnormal posture: Secondary | ICD-10-CM | POA: Insufficient documentation

## 2023-12-01 NOTE — Therapy (Signed)
 OUTPATIENT PHYSICAL THERAPY LOWER EXTREMITY TREATMENT/ RECERTIFICATION  Patient Name: Mike Holloway MRN: [id number] DOB:1941-05-19, 83 y.o., male Today's Date: 12/01/2023  END OF SESSION:  PT End of Session - 12/01/23 1355     Visit Number 11    Number of Visits 19    Date for PT Re-Evaluation 01/26/24    PT Start Time 1347    PT Stop Time 1446    PT Time Calculation (min) 59 min    Activity Tolerance Patient tolerated treatment well             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   SIADH (syndrome of inappropriate ADH production) (HCC)    Sleep apnea    Stroke [facility])    Past Surgical History:  Procedure Laterality Date   ABLATION     APPENDECTOMY     CATARACT EXTRACTION, BILATERAL     COLONOSCOPY WITH PROPOFOL N/A 03/17/2018   Procedure: COLONOSCOPY WITH PROPOFOL;  Surgeon: [name], MD;  Location: [facility] 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   Procedure: FECAL TRANSPLANT;  Surgeon: [name], MD;  Location: [facility] ENDOSCOPY;  Service: Gastroenterology;  Laterality: N/A;   HEMORRHOID SURGERY     HERNIA REPAIR     HIP FRACTURE SURGERY     INTRAMEDULLARY (IM) NAIL INTERTROCHANTERIC Right 09/16/2018   Procedure: INTRAMEDULLARY (IM) NAIL INTERTROCHANTRIC;  Surgeon: [name], MD;  Location: [facility] ORS;  Service: Orthopedics;  Laterality: Right;   IR CATHETER TUBE CHANGE  11/22/2018   NASAL SINUS SURGERY     ORCHIECTOMY     TONSILLECTOMY     Patient Active Problem List   Diagnosis Date Noted   DVT femoral (deep venous thrombosis) with thrombophlebitis, left (HCC) 02/06/2020   Lymphedema 04/15/2019   Bilateral leg edema 01/25/2019    Hyponatremia 12/24/2018   SIADH (syndrome of inappropriate ADH production) (HCC) 12/24/2018   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) 10/27/2018   Generalized weakness 10/14/2018   Hip fracture (HCC) 09/15/2018   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   Recurrent Clostridium difficile diarrhea 03/24/2018   HLD (hyperlipidemia) 03/24/2018   Contusion of right knee 02/14/2018   Bradycardia 12/28/2017   Status post right unicompartmental knee replacement 11/03/2017   Tremor 09/04/2016   Chronic pain of right knee 08/10/2016   Right ankle pain 08/10/2016   Chronic venous insufficiency 05/13/2016   Non-Hodgkin's lymphoma (HCC) 12/11/2015   Urinary retention 09/08/2015   Lymphoma, non-Hodgkin's (HCC) 04/03/2015   Breathlessness on exertion 11/21/2014   Breath shortness 11/21/2014   Arthropathy 11/07/2014   Atrial flutter, paroxysmal (HCC) 11/07/2014   Type 2 diabetes mellitus (HCC) 11/07/2014   Benign essential tremor 11/07/2014   Benign essential HTN 11/07/2014   Mixed incontinence 11/07/2014   Hypercholesterolemia without hypertriglyceridemia 11/07/2014   Apnea, sleep 11/07/2014   Controlled type 2 diabetes mellitus without complication (HCC) 11/07/2014   Pure hypercholesterolemia 11/07/2014   Other abnormalities of gait and mobility 11/02/2011   Decreased mobility 11/02/2011   Abnormal gait 06/29/2011   Discoordination 06/29/2011   PCP:  Marina Goodell, MD  REFERRING PROVIDER: Marina Goodell, MD  REFERRING DIAG: Gait instability  THERAPY DIAG:  Abnormal posture  Gait difficulty  Balance problem  Muscle weakness (generalized)  Other lack of coordination  Rationale for Evaluation and Treatment: Rehabilitation  ONSET DATE: Chronic  SUBJECTIVE:   SUBJECTIVE STATEMENT: Pt. Known well to PT clinic.  Pt. Had a fall in August and requires assist to return to standing.  Pts. Wife reports pt. Is  having increase difficulty with standing/walking.  Pt. Using rollator for household mobility and reports limited tasks out of house.  Pt. C/o 1/10 R knee pain and no current treatment plan.  Pt. Has chronic h/o R knee pain and has tried injections/ brace in past with no benefit.  Pt. Able to walk into church with rollator but requires use of w/c when walking into [employer].  Pt. Able to ascend stairs at son's house with assist and step to pattern.    PERTINENT HISTORY: See MD notes/ recent SLP and OT notes.    PAIN:  Are you having pain? Yes: NPRS scale: 1/10 Pain location: R knee Pain description: sharp/ aching Aggravating factors: standing/walking Relieving factors: sitting  PRECAUTIONS: Fall  RED FLAGS: Bowel or bladder incontinence: Yes:      WEIGHT BEARING RESTRICTIONS: No  FALLS:  Has patient fallen in last 6 months? Yes. Number of falls 1  LIVING ENVIRONMENT: Lives with: lives with their spouse Lives in: House/apartment Stairs: No Has following equipment at home: Environmental consultant - 4 wheeled and Grab bars  OCCUPATION: Retired  PLOF: Requires assistive device for independence  PATIENT GOALS: Improve LE strength/ gait/ balance.  Decrease fall risk.    NEXT MD VISIT: PRN  OBJECTIVE:  Note: Objective measures were completed at Evaluation unless otherwise noted.  PATIENT SURVEYS:  FOTO initial 29/goal 31  COGNITION: Overall cognitive status: Impaired     SENSATION: Not tested  EDEMA:  NT  MUSCLE LENGTH: NT  POSTURE: rounded shoulders and forward head  PALPATION: R knee joint line tenderness  LOWER EXTREMITY ROM:   B UE/ LE AROM WFL.  Pt. Pain limited with R knee flexion/extension OP.   LOWER EXTREMITY MMT:  MMT Right eval Left eval  Hip flexion 4- 4-  Hip extension    Hip abduction    Hip adduction    Hip internal rotation    Hip external rotation    Knee flexion 5 5  Knee extension 4 4  Ankle dorsiflexion 4 4  Ankle  plantarflexion    Ankle inversion    Ankle eversion     (Blank rows = not tested)  B shoulder: flexion 5/5, abduction 4/5, tricep 4+/5, bicep 5/5.    LOWER EXTREMITY SPECIAL TESTS:  NT  FUNCTIONAL TESTS:  5 times sit to stand: TBD 6 minute walk test: TBD Berg Balance Scale: 25/56 (significant fall risk)  GAIT: Distance walked: in clinic Assistive device utilized: Walker - 4 wheeled Level of assistance: Modified independence and SBA Comments: Antalgic gait pattern with heavy UE assist/ use of brakes on rollator.  Increase c/o R knee pain and fearful of R knee buckling.  High fall risk.  TREATMENT DATE: 12/01/2023  Subjective:    Pt. Entered PT with use of rollator and heavy UE assist for safety.  Pt. Ordered new rollator on [employer] and it will arrive tomorrow.  Pt. Had another episode of "spacing out" while sitting on edge of bed last night (?seizure).  Pt. Has f/u with Dr. Maryjane Hurter this afternoon.    There.ex.:   Nustep L3 10 min. B LE only (0.5 miles).  MH to low back during Nustep.  Discussed daily activity/ R knee and low back symptoms.    5# ankle wts.: Seated LAQ/ marching (fatigue) 20x each.  Walking in //-bars 5 laps each with cuing to increase hip flexion/ step pattern.       There.act.:  Blaze pods: standing pods LE touches in //-bars (Random) for 3 min x 2  (82 hits and 99 hits).  Seated rest break between sets.      Amb. In clinic with 5# ankle wts. Around PT clinic with use of rollator and focus on consistent hip flexion/ heel strike.  No increase c/o knee pain.    Amb. From //-bars to car with CGA for safety and cuing to correct posture/ step pattern.   Managing doing down outside ramp to car.   PATIENT EDUCATION:  Education details: HEP Person educated: Patient and Spouse Education method: Medical illustrator Education comprehension:  verbalized understanding and returned demonstration  HOME EXERCISE PROGRAM: See handouts  ASSESSMENT:  CLINICAL IMPRESSION: Pt. Motivated to work with PT to increase LE strength/ standing tolerance. Today's treatment session focused on maintaining balance during dynamic free-standing activities. Pt. Is challenged by exercises limiting unilateral UE assistance including blaze pods wt. shifting.  Increase standing tolerance during Blaze pods/ wt. Shifting tasks in //-bars today with no increase c/o back pain.  Pt. Requires several short seated rest breaks and heavy use of B UE with sit to stands/ walking with rollator.  Pt. continues to ambulate increase distances during hallway tasks and while going to side of building/ managing ramp with rollator. Pt. Will benefit from skilled PT services to increase standing tolerance/ walking household distances with improved balance/ decrease fall risk.    OBJECTIVE IMPAIRMENTS: Abnormal gait, decreased activity tolerance, decreased balance, decreased endurance, decreased mobility, difficulty walking, decreased strength, hypomobility, improper body mechanics, postural dysfunction, and pain.   ACTIVITY LIMITATIONS: standing, stairs, transfers, and locomotion level  PARTICIPATION LIMITATIONS: community activity and church  PERSONAL FACTORS: Fitness and Past/current experiences are also affecting patient's functional outcome.   REHAB POTENTIAL: Good  CLINICAL DECISION MAKING: Evolving/moderate complexity  EVALUATION COMPLEXITY: Moderate   GOALS: Goals reviewed with patient? Yes  SHORT TERM GOALS: Target date: 12/22/23 Pt. Will increase B hip/LE muscle strength 1/2 muscle grade to improve transfers/ standing tolerance/   Baseline:  see above Goal status: Partially met   LONG TERM GOALS: Target date: 01/26/24  Pt. Standing from standard height chair in rollator with 1 attempts and use of 1 UE assist safely. Baseline:  Extra time/ B UE assist.  Prefers  pulling up Goal status: Initial  2.  Pt. Will increase Berg balance test score to >35/56 to decrease fall risk/ improve balance with walking.   Baseline: initial 25 (high fall risk) Goal status: On-going  3.  Pt. Able to ambulate from car into [facility] [employer] with use of rollator and SBA/mod. I safely to improve functional mobility.   Baseline: pt. Requires w/c with increase walking distances.  Goal status: Not met  PLAN:  PT FREQUENCY:  1x/week  PT DURATION: 8 weeks  PLANNED INTERVENTIONS: 97110-Therapeutic exercises, 97530- Therapeutic activity, [id number]- Neuromuscular re-education, 97535- Self Care, [id number]- Manual therapy, [phone]- Gait training, Patient/Family education, Balance training, Stair training, Joint mobilization, Cryotherapy, and Moist heat  PLAN FOR NEXT SESSION:  Increase standing tolerance  Cammie Mcgee, PT, DPT # [phone] 12/01/2023, 2:48 PM   Milford Cage, SPT

## 2023-12-02 ENCOUNTER — Encounter: Payer: Self-pay | Admitting: Physician Assistant

## 2023-12-02 ENCOUNTER — Ambulatory Visit (INDEPENDENT_AMBULATORY_CARE_PROVIDER_SITE_OTHER): Payer: Medicare Other | Admitting: Physician Assistant

## 2023-12-02 VITALS — BP 147/80 | HR 73 | Ht 68.0 in | Wt 210.0 lb

## 2023-12-02 DIAGNOSIS — R339 Retention of urine, unspecified: Secondary | ICD-10-CM | POA: Diagnosis not present

## 2023-12-02 DIAGNOSIS — R4182 Altered mental status, unspecified: Secondary | ICD-10-CM

## 2023-12-02 DIAGNOSIS — Z466 Encounter for fitting and adjustment of urinary device: Secondary | ICD-10-CM

## 2023-12-02 NOTE — Progress Notes (Signed)
 Cath Change/ Replacement  Patient is present today for a catheter change due to urinary retention.  8ml of water was removed from the balloon, a 16FR coude foley cath was removed without difficulty.  Patient was cleaned and prepped in a sterile fashion with betadine and 2% lidocaine jelly was instilled into the urethra. A 16 FR foley cath was replaced into the bladder, no complications were noted. Urine return was noted 5ml and urine was yellow in color. The balloon was filled with 10ml of sterile water. A leg bag was attached for drainage.  Patient tolerated well.    Performed by: Carman Ching, PA-C   Additional notes: He continues to have occasional episodes of AMS lasting up to 15-20 minutes. Scheduled with neurology in April. Unclear if UTI related. Urine sample obtained from new catheter today for culture; ok to start empiric Doxy in the meantime prescribed by PCP.  Follow up: Return in about 4 weeks (around 12/30/2023) for Catheter exchange.

## 2023-12-07 LAB — CULTURE, URINE COMPREHENSIVE

## 2023-12-09 ENCOUNTER — Other Ambulatory Visit: Payer: Self-pay

## 2023-12-09 MED ORDER — CEFDINIR 300 MG PO CAPS: 300.0000 mg | ORAL_CAPSULE | Freq: Two times a day (BID) | ORAL | 0 refills | Status: AC

## 2023-12-12 ENCOUNTER — Ambulatory Visit: Payer: Medicare Other | Admitting: Physical Therapy

## 2023-12-12 ENCOUNTER — Encounter: Payer: Self-pay | Admitting: Physical Therapy

## 2023-12-12 DIAGNOSIS — M6281 Muscle weakness (generalized): Secondary | ICD-10-CM

## 2023-12-12 DIAGNOSIS — R278 Other lack of coordination: Secondary | ICD-10-CM

## 2023-12-12 DIAGNOSIS — R293 Abnormal posture: Secondary | ICD-10-CM

## 2023-12-12 DIAGNOSIS — R269 Unspecified abnormalities of gait and mobility: Secondary | ICD-10-CM

## 2023-12-12 DIAGNOSIS — R2689 Other abnormalities of gait and mobility: Secondary | ICD-10-CM

## 2023-12-12 NOTE — Therapy (Signed)
 OUTPATIENT PHYSICAL THERAPY LOWER EXTREMITY TREATMENT  Patient Name: Mike Holloway MRN: [id number] DOB:03-26-1941, 83 y.o., male Today's Date: 12/12/2023  END OF SESSION:  PT End of Session - 12/12/23 1343     Visit Number 12    Number of Visits 19    Date for PT Re-Evaluation 01/26/24    PT Start Time 1343    PT Stop Time 1436    PT Time Calculation (min) 53 min    Activity Tolerance Patient tolerated treatment well             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   SIADH (syndrome of inappropriate ADH production) (HCC)    Sleep apnea    Stroke [facility])    Past Surgical History:  Procedure Laterality Date   ABLATION     APPENDECTOMY     CATARACT EXTRACTION, BILATERAL     COLONOSCOPY WITH PROPOFOL N/A 03/17/2018   Procedure: COLONOSCOPY WITH PROPOFOL;  Surgeon: [name], MD;  Location: [facility] 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   Procedure: FECAL TRANSPLANT;  Surgeon: [name], MD;  Location: [facility] ENDOSCOPY;  Service: Gastroenterology;  Laterality: N/A;   HEMORRHOID SURGERY     HERNIA REPAIR     HIP FRACTURE SURGERY     INTRAMEDULLARY (IM) NAIL INTERTROCHANTERIC Right 09/16/2018   Procedure: INTRAMEDULLARY (IM) NAIL INTERTROCHANTRIC;  Surgeon: [name], MD;  Location: [facility] ORS;  Service: Orthopedics;  Laterality: Right;   IR CATHETER TUBE CHANGE  11/22/2018   NASAL SINUS SURGERY     ORCHIECTOMY     TONSILLECTOMY     Patient Active Problem List   Diagnosis Date Noted   DVT femoral (deep venous thrombosis) with thrombophlebitis, left (HCC) 02/06/2020   Lymphedema 04/15/2019   Bilateral leg edema 01/25/2019   Hyponatremia  12/24/2018   SIADH (syndrome of inappropriate ADH production) (HCC) 12/24/2018   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) 10/27/2018   Generalized weakness 10/14/2018   Hip fracture (HCC) 09/15/2018   Moderate mitral insufficiency 08/16/2018   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   HLD (hyperlipidemia) 03/24/2018   Contusion of right knee 02/14/2018   Bradycardia 12/28/2017   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/03/2017   Tremor 09/04/2016   Chronic pain of right knee 08/10/2016   Right ankle pain 08/10/2016   Chronic venous insufficiency 05/13/2016   Non-Hodgkin's lymphoma (HCC) 12/11/2015   Urinary retention 09/08/2015   Lymphoma, non-Hodgkin's (HCC) 04/03/2015   Breathlessness on exertion 11/21/2014   Breath shortness 11/21/2014   Arthropathy 11/07/2014   Atrial flutter, paroxysmal (HCC) 11/07/2014   Type 2 diabetes mellitus (HCC) 11/07/2014   Benign essential tremor 11/07/2014   Benign essential HTN 11/07/2014   Mixed incontinence 11/07/2014   Hypercholesterolemia without hypertriglyceridemia 11/07/2014   Apnea, sleep 11/07/2014   Controlled type 2 diabetes mellitus without complication (HCC) 11/07/2014   Pure hypercholesterolemia 11/07/2014   Other abnormalities of gait and mobility 11/02/2011   Decreased mobility 11/02/2011   Abnormal gait 06/29/2011   Discoordination 06/29/2011   PCP: Maryjane Hurter,  Madaline Guthrie, MD  REFERRING PROVIDER: Marina Goodell, MD  REFERRING DIAG: Gait instability  THERAPY DIAG:  Abnormal posture  Gait difficulty  Balance problem  Muscle weakness (generalized)  Other lack of coordination  Rationale for Evaluation and Treatment: Rehabilitation  ONSET DATE: Chronic  SUBJECTIVE:   SUBJECTIVE STATEMENT: Pt. Known well to PT clinic.  Pt. Had a fall in August and requires assist to return to standing.  Pts. Wife reports pt. Is having increase  difficulty with standing/walking.  Pt. Using rollator for household mobility and reports limited tasks out of house.  Pt. C/o 1/10 R knee pain and no current treatment plan.  Pt. Has chronic h/o R knee pain and has tried injections/ brace in past with no benefit.  Pt. Able to walk into church with rollator but requires use of w/c when walking into [place] [employer].  Pt. Able to ascend stairs at son's house with assist and step to pattern.    PERTINENT HISTORY: See MD notes/ recent SLP and OT notes.    PAIN:  Are you having pain? Yes: NPRS scale: 1/10 Pain location: R knee Pain description: sharp/ aching Aggravating factors: standing/walking Relieving factors: sitting  PRECAUTIONS: Fall  RED FLAGS: Bowel or bladder incontinence: Yes:      WEIGHT BEARING RESTRICTIONS: No  FALLS:  Has patient fallen in last 6 months? Yes. Number of falls 1  LIVING ENVIRONMENT: Lives with: lives with their spouse Lives in: House/apartment Stairs: No Has following equipment at home: Environmental consultant - 4 wheeled and Grab bars  OCCUPATION: Retired  PLOF: Requires assistive device for independence  PATIENT GOALS: Improve LE strength/ gait/ balance.  Decrease fall risk.    NEXT MD VISIT: PRN  OBJECTIVE:  Note: Objective measures were completed at Evaluation unless otherwise noted.  PATIENT SURVEYS:  FOTO initial 29/goal 95  COGNITION: Overall cognitive status: Impaired     SENSATION: Not tested  EDEMA:  NT  MUSCLE LENGTH: NT  POSTURE: rounded shoulders and forward head  PALPATION: R knee joint line tenderness  LOWER EXTREMITY ROM:   B UE/ LE AROM WFL.  Pt. Pain limited with R knee flexion/extension OP.   LOWER EXTREMITY MMT:  MMT Right eval Left eval  Hip flexion 4- 4-  Hip extension    Hip abduction    Hip adduction    Hip internal rotation    Hip external rotation    Knee flexion 5 5  Knee extension 4 4  Ankle dorsiflexion 4 4  Ankle plantarflexion    Ankle  inversion    Ankle eversion     (Blank rows = not tested)  B shoulder: flexion 5/5, abduction 4/5, tricep 4+/5, bicep 5/5.    LOWER EXTREMITY SPECIAL TESTS:  NT  FUNCTIONAL TESTS:  5 times sit to stand: TBD 6 minute walk test: TBD Berg Balance Scale: 25/56 (significant fall risk)  GAIT: Distance walked: in clinic Assistive device utilized: Walker - 4 wheeled Level of assistance: Modified independence and SBA Comments: Antalgic gait pattern with heavy UE assist/ use of brakes on rollator.  Increase c/o R knee pain and fearful of R knee buckling.  High fall risk.  TREATMENT DATE: 12/12/2023  Subjective:    Pt. Entered PT with new rollator and heavy UE assist for safety.  PT Inspects pts. L tip of pinky finger (swollen/ limited flexion) since Blaze pod ex. 3 weeks ago.  Pain limited with DIP flexion and bruising noted on palmar aspect.     There.ex.:   Nustep L5-4 10 min. B LE only (0.5 miles).  MH to low back during Nustep.  Discussed daily activity/ R knee and low back symptoms.    5# ankle wts.: Seated LAQ/ marching (limited with R hip flexion as compared to L) 20x each.  Standing wt. Shifting with 5# ankle wts. On and light UE assist.  STS from gray chair into //-bars.  Heavy UE assist and use of arm rests.     There.act.:  5# ankle wts.: Airex step ups/ downs with B UE assist on //-bars.  Attempted 1 UE only but limited L knee/quad muscle control during step up.    5# ankle wts.: forward/ backwards walking in //-bars (increase hip flexion/ step length)- 3 laps.  Lateral walking 3 laps with ankle wts.    Amb. In clinic with 5# ankle wts. Around PT clinic with use of rollator and focus on consistent hip flexion/ heel strike.  No increase c/o knee pain.    Amb. From //-bars to car with CGA for safety and cuing to correct posture/ step pattern.   Managing doing down  outside ramp to car with new rollator.  Instructed pts. Wife on proper opening/ closing of rollator.    PATIENT EDUCATION:  Education details: HEP Person educated: Patient and Spouse Education method: Medical illustrator Education comprehension: verbalized understanding and returned demonstration  HOME EXERCISE PROGRAM: See handouts  ASSESSMENT:  CLINICAL IMPRESSION: Pt. Motivated to work with PT to increase LE strength/ standing tolerance. Today's treatment session focused on maintaining balance during dynamic free-standing activities. Pt. Is challenged by exercises limiting unilateral UE assistance.   Pt. Requires several short seated rest breaks and heavy use of B UE with sit to stands/ walking with rollator.  Pt. continues to ambulate increase distances during hallway tasks and while going to side of building/ managing ramp with rollator. Pt. Will benefit from skilled PT services to increase standing tolerance/ walking household distances with improved balance/ decrease fall risk.    OBJECTIVE IMPAIRMENTS: Abnormal gait, decreased activity tolerance, decreased balance, decreased endurance, decreased mobility, difficulty walking, decreased strength, hypomobility, improper body mechanics, postural dysfunction, and pain.   ACTIVITY LIMITATIONS: standing, stairs, transfers, and locomotion level  PARTICIPATION LIMITATIONS: community activity and church  PERSONAL FACTORS: Fitness and Past/current experiences are also affecting patient's functional outcome.   REHAB POTENTIAL: Good  CLINICAL DECISION MAKING: Evolving/moderate complexity  EVALUATION COMPLEXITY: Moderate   GOALS: Goals reviewed with patient? Yes  SHORT TERM GOALS: Target date: 12/22/23 Pt. Will increase B hip/LE muscle strength 1/2 muscle grade to improve transfers/ standing tolerance/   Baseline:  see above Goal status: Partially met   LONG TERM GOALS: Target date: 01/26/24  Pt. Standing from standard  height chair in rollator with 1 attempts and use of 1 UE assist safely. Baseline:  Extra time/ B UE assist.  Prefers pulling up Goal status: Initial  2.  Pt. Will increase Berg balance test score to >35/56 to decrease fall risk/ improve balance with walking.   Baseline: initial 25 (high fall risk) Goal status: On-going  3.  Pt. Able to ambulate from car into [place] [employer] with use of  rollator and SBA/mod. I safely to improve functional mobility.   Baseline: pt. Requires w/c with increase walking distances.  Goal status: Not met  PLAN:  PT FREQUENCY: 1x/week  PT DURATION: 8 weeks  PLANNED INTERVENTIONS: 97110-Therapeutic exercises, 97530- Therapeutic activity, [id number]- Neuromuscular re-education, 97535- Self Care, 16109- Manual therapy, [phone]- Gait training, Patient/Family education, Balance training, Stair training, Joint mobilization, Cryotherapy, and Moist heat  PLAN FOR NEXT SESSION:  Increase standing tolerance  Cammie Mcgee, PT, DPT # [phone] 12/12/2023, 5:47 PM

## 2023-12-22 ENCOUNTER — Encounter: Payer: Self-pay | Admitting: Physical Therapy

## 2023-12-22 ENCOUNTER — Ambulatory Visit: Payer: Medicare Other | Admitting: Physical Therapy

## 2023-12-22 DIAGNOSIS — R269 Unspecified abnormalities of gait and mobility: Secondary | ICD-10-CM

## 2023-12-22 DIAGNOSIS — R278 Other lack of coordination: Secondary | ICD-10-CM

## 2023-12-22 DIAGNOSIS — R2689 Other abnormalities of gait and mobility: Secondary | ICD-10-CM

## 2023-12-22 DIAGNOSIS — R293 Abnormal posture: Secondary | ICD-10-CM

## 2023-12-22 DIAGNOSIS — M6281 Muscle weakness (generalized): Secondary | ICD-10-CM

## 2023-12-22 NOTE — Therapy (Signed)
 OUTPATIENT PHYSICAL THERAPY LOWER EXTREMITY TREATMENT  Patient Name: Mike Holloway MRN: [id number] DOB:[birth year], 83 y.o., male Today's Date: 12/22/2023  END OF SESSION:  PT End of Session - 12/22/23 1349     Visit Number 13    Number of Visits 19    Date for PT Re-Evaluation 01/26/24    PT Start Time 1349    PT Stop Time 1454    PT Time Calculation (min) 65 min    Activity Tolerance Patient tolerated treatment well             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   SIADH (syndrome of inappropriate ADH production) (HCC)    Sleep apnea    Stroke [facility])    Past Surgical History:  Procedure Laterality Date   ABLATION     APPENDECTOMY     CATARACT EXTRACTION, BILATERAL     COLONOSCOPY WITH PROPOFOL N/A 03/17/2018   Procedure: COLONOSCOPY WITH PROPOFOL;  Surgeon: [name], MD;  Location: [facility] 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   Procedure: FECAL TRANSPLANT;  Surgeon: [name], MD;  Location: [facility] ENDOSCOPY;  Service: Gastroenterology;  Laterality: N/A;   HEMORRHOID SURGERY     HERNIA REPAIR     HIP FRACTURE SURGERY     INTRAMEDULLARY (IM) NAIL INTERTROCHANTERIC Right 09/16/2018   Procedure: INTRAMEDULLARY (IM) NAIL INTERTROCHANTRIC;  Surgeon: [name], MD;  Location: [facility] ORS;  Service: Orthopedics;  Laterality: Right;   IR CATHETER TUBE CHANGE  11/22/2018   NASAL SINUS SURGERY     ORCHIECTOMY     TONSILLECTOMY     Patient Active Problem List   Diagnosis Date Noted   DVT femoral (deep venous thrombosis) with thrombophlebitis, left (HCC) 02/06/2020   Lymphedema 04/15/2019   Bilateral leg edema 01/25/2019   Hyponatremia  12/24/2018   SIADH (syndrome of inappropriate ADH production) (HCC) 12/24/2018   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) 10/27/2018   Generalized weakness 10/14/2018   Hip fracture (HCC) 09/15/2018   Moderate mitral insufficiency 08/16/2018   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   Recurrent Clostridium difficile diarrhea 03/24/2018   HLD (hyperlipidemia) 03/24/2018   Contusion of right knee 02/14/2018   Bradycardia 12/28/2017   Status post right unicompartmental knee replacement 11/03/2017   Tremor 09/04/2016   Chronic pain of right knee 08/10/2016   Right ankle pain 08/10/2016   Chronic venous insufficiency 05/13/2016   Non-Hodgkin's lymphoma (HCC) 12/11/2015   Urinary retention 09/08/2015   Lymphoma, non-Hodgkin's (HCC) 04/03/2015   Breathlessness on exertion 11/21/2014   Breath shortness 11/21/2014   Arthropathy 11/07/2014   Atrial flutter, paroxysmal (HCC) 11/07/2014   Type 2 diabetes mellitus (HCC) 11/07/2014   Benign essential tremor 11/07/2014   Benign essential HTN 11/07/2014   Mixed incontinence 11/07/2014   Hypercholesterolemia without hypertriglyceridemia 11/07/2014   Apnea, sleep 11/07/2014   Controlled type 2 diabetes mellitus without complication (HCC) 11/07/2014   Pure hypercholesterolemia 11/07/2014   Other abnormalities of gait and mobility 11/02/2011   Decreased mobility 11/02/2011   Abnormal gait 06/29/2011   Discoordination 06/29/2011   PCP: Maryjane Hurter,  Madaline Guthrie, MD  REFERRING PROVIDER: Marina Goodell, MD  REFERRING DIAG: Gait instability  THERAPY DIAG:  Abnormal posture  Gait difficulty  Balance problem  Muscle weakness (generalized)  Other lack of coordination  Rationale for Evaluation and Treatment: Rehabilitation  ONSET DATE: Chronic  SUBJECTIVE:   SUBJECTIVE STATEMENT: Pt. Known well to PT clinic.  Pt. Had a fall in August and requires assist to return to standing.  Pts. Wife reports pt. Is having increase  difficulty with standing/walking.  Pt. Using rollator for household mobility and reports limited tasks out of house.  Pt. C/o 1/10 R knee pain and no current treatment plan.  Pt. Has chronic h/o R knee pain and has tried injections/ brace in past with no benefit.  Pt. Able to walk into church with rollator but requires use of w/c when walking into [place] [employer].  Pt. Able to ascend stairs at son's house with assist and step to pattern.    PERTINENT HISTORY: See MD notes/ recent SLP and OT notes.    PAIN:  Are you having pain? Yes: NPRS scale: 1/10 Pain location: R knee Pain description: sharp/ aching Aggravating factors: standing/walking Relieving factors: sitting  PRECAUTIONS: Fall  RED FLAGS: Bowel or bladder incontinence: Yes:      WEIGHT BEARING RESTRICTIONS: No  FALLS:  Has patient fallen in last 6 months? Yes. Number of falls 1  LIVING ENVIRONMENT: Lives with: lives with their spouse Lives in: House/apartment Stairs: No Has following equipment at home: Environmental consultant - 4 wheeled and Grab bars  OCCUPATION: Retired  PLOF: Requires assistive device for independence  PATIENT GOALS: Improve LE strength/ gait/ balance.  Decrease fall risk.    NEXT MD VISIT: PRN  OBJECTIVE:  Note: Objective measures were completed at Evaluation unless otherwise noted.  PATIENT SURVEYS:  FOTO initial 29/goal 72  COGNITION: Overall cognitive status: Impaired     SENSATION: Not tested  EDEMA:  NT  MUSCLE LENGTH: NT  POSTURE: rounded shoulders and forward head  PALPATION: R knee joint line tenderness  LOWER EXTREMITY ROM:   B UE/ LE AROM WFL.  Pt. Pain limited with R knee flexion/extension OP.   LOWER EXTREMITY MMT:  MMT Right eval Left eval  Hip flexion 4- 4-  Hip extension    Hip abduction    Hip adduction    Hip internal rotation    Hip external rotation    Knee flexion 5 5  Knee extension 4 4  Ankle dorsiflexion 4 4  Ankle plantarflexion    Ankle  inversion    Ankle eversion     (Blank rows = not tested)  B shoulder: flexion 5/5, abduction 4/5, tricep 4+/5, bicep 5/5.    LOWER EXTREMITY SPECIAL TESTS:  NT  FUNCTIONAL TESTS:  5 times sit to stand: TBD 6 minute walk test: TBD Berg Balance Scale: 25/56 (significant fall risk)  GAIT: Distance walked: in clinic Assistive device utilized: Walker - 4 wheeled Level of assistance: Modified independence and SBA Comments: Antalgic gait pattern with heavy UE assist/ use of brakes on rollator.  Increase c/o R knee pain and fearful of R knee buckling.  High fall risk.  TREATMENT DATE: 12/22/2023  Subjective:    Pt. Entered PT with rollator and heavy UE assist for safety.  No new complaints or falls.  Pt. Reports no LBP but R knee remains pain limited with prolonged standing/walking.  Pts. Wife discussed recent imaging results to PT (see imaging).    There.ex.:   Nustep L5-4 10 min. B LE only (0.5 miles).  MH to low back during Nustep.  Discussed daily activity/ R knee and low back symptoms.  Increase c/o R knee pain after standing from Nustep.    Seated LAQ (reassessment of patellar tracking/ joint line tenderness).  Seated marching/ heel and toe raises.    Discussed HEP with pt./ wife.  There.act.:  Walking in //-bars with added Airex pad for step ups/overs (4 laps forward/ 2 laps lateral with heavy UE assist on //-bars).    Standing wt. Shifting with no UE assist.   Standing turning CW/CCW with light UE assist.  Posture correction/ SBA for safety.  Increase LBP with prolonged standing.    Forward walking with marching and backwards walking with focus on hip extension 3 laps.    Amb. In clinic around PT clinic with use of rollator and focus on consistent hip flexion/ heel strike.  Decrease R knee pain since start of PT tx. Session but slight increase in back symptoms with  prolonged standing.    Amb. From //-bars to car with CGA for safety and cuing to correct posture/ step pattern.   Managing doing down outside ramp to car with new rollator.  Instructed pts. Wife on proper opening/ closing of rollator.  Pts. Wife demonstrates ability to push down on chair to snap it into place.     NOT TODAY 5# ankle wts.: Seated LAQ/ marching (limited with R hip flexion as compared to L) 20x each.  Standing wt. Shifting with 5# ankle wts. On and light UE assist.  STS from gray chair into //-bars.  Heavy UE assist and use of arm rests.  5# ankle wts.: Airex step ups/ downs with B UE assist on //-bars.  Attempted 1 UE only but limited L knee/quad muscle control during step up.    5# ankle wts.: forward/ backwards walking in //-bars (increase hip flexion/ step length)- 3 laps.  Lateral walking 3 laps with ankle wts.      PATIENT EDUCATION:  Education details: HEP Person educated: Patient and Spouse Education method: Medical illustrator Education comprehension: verbalized understanding and returned demonstration  HOME EXERCISE PROGRAM: See handouts  ASSESSMENT:  CLINICAL IMPRESSION: Today's treatment session focused on maintaining balance during dynamic free-standing activities. Pt. Is challenged by exercises limiting unilateral UE assistance.   Pt. Requires several short seated rest breaks and heavy use of B UE with sit to stands/ walking with rollator.  Increase c/o R knee pain after Nustep and initial steps after standing.  Pt. continues to ambulate increase distances during hallway tasks and while going to side of building/ managing ramp with rollator. Pt. Will benefit from skilled PT services to increase standing tolerance/ walking household distances with improved balance/ decrease fall risk.    OBJECTIVE IMPAIRMENTS: Abnormal gait, decreased activity tolerance, decreased balance, decreased endurance, decreased mobility, difficulty walking, decreased strength,  hypomobility, improper body mechanics, postural dysfunction, and pain.   ACTIVITY LIMITATIONS: standing, stairs, transfers, and locomotion level  PARTICIPATION LIMITATIONS: community activity and church  PERSONAL FACTORS: Fitness and Past/current experiences are also affecting patient's functional outcome.   REHAB POTENTIAL: Good  CLINICAL DECISION MAKING: Evolving/moderate  complexity  EVALUATION COMPLEXITY: Moderate   GOALS: Goals reviewed with patient? Yes  SHORT TERM GOALS: Target date: 12/22/23 Pt. Will increase B hip/LE muscle strength 1/2 muscle grade to improve transfers/ standing tolerance/   Baseline:  see above Goal status: Partially met   LONG TERM GOALS: Target date: 01/26/24  Pt. Standing from standard height chair in rollator with 1 attempts and use of 1 UE assist safely. Baseline:  Extra time/ B UE assist.  Prefers pulling up Goal status: Initial  2.  Pt. Will increase Berg balance test score to >35/56 to decrease fall risk/ improve balance with walking.   Baseline: initial 25 (high fall risk) Goal status: On-going  3.  Pt. Able to ambulate from car into [facility] [employer] with use of rollator and SBA/mod. I safely to improve functional mobility.   Baseline: pt. Requires w/c with increase walking distances.  Goal status: Not met  PLAN:  PT FREQUENCY: 1x/week  PT DURATION: 8 weeks  PLANNED INTERVENTIONS: 97110-Therapeutic exercises, 97530- Therapeutic activity, [id number]- Neuromuscular re-education, 97535- Self Care, [id number]- Manual therapy, [phone]- Gait training, Patient/Family education, Balance training, Stair training, Joint mobilization, Cryotherapy, and Moist heat  PLAN FOR NEXT SESSION:  Increase standing tolerance  Cammie Mcgee, PT, DPT # [phone] 12/22/2023, 2:56 PM

## 2023-12-29 ENCOUNTER — Ambulatory Visit: Payer: Medicare Other | Attending: Family Medicine | Admitting: Physical Therapy

## 2023-12-29 ENCOUNTER — Encounter: Payer: Self-pay | Admitting: Physical Therapy

## 2023-12-29 DIAGNOSIS — M6281 Muscle weakness (generalized): Secondary | ICD-10-CM | POA: Insufficient documentation

## 2023-12-29 DIAGNOSIS — R293 Abnormal posture: Secondary | ICD-10-CM | POA: Diagnosis present

## 2023-12-29 DIAGNOSIS — Z466 Encounter for fitting and adjustment of urinary device: Secondary | ICD-10-CM | POA: Insufficient documentation

## 2023-12-29 DIAGNOSIS — R269 Unspecified abnormalities of gait and mobility: Secondary | ICD-10-CM | POA: Insufficient documentation

## 2023-12-29 DIAGNOSIS — R278 Other lack of coordination: Secondary | ICD-10-CM | POA: Diagnosis present

## 2023-12-29 DIAGNOSIS — R2689 Other abnormalities of gait and mobility: Secondary | ICD-10-CM | POA: Insufficient documentation

## 2023-12-29 NOTE — Therapy (Signed)
 OUTPATIENT PHYSICAL THERAPY LOWER EXTREMITY TREATMENT  Patient Name: Mike Holloway MRN: [id number] DOB:1940/11/13, 83 y.o., male Today's Date: 12/29/2023  END OF SESSION:  PT End of Session - 12/29/23 1341     Visit Number 14    Number of Visits 19    Date for PT Re-Evaluation 01/26/24    PT Start Time 1341    PT Stop Time 1432    PT Time Calculation (min) 51 min    Activity Tolerance Patient tolerated treatment well             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   SIADH (syndrome of inappropriate ADH production) (HCC)    Sleep apnea    Stroke [facility])    Past Surgical History:  Procedure Laterality Date   ABLATION     APPENDECTOMY     CATARACT EXTRACTION, BILATERAL     COLONOSCOPY WITH PROPOFOL N/A 03/17/2018   Procedure: COLONOSCOPY WITH PROPOFOL;  Surgeon: [name], MD;  Location: [facility] 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   Procedure: FECAL TRANSPLANT;  Surgeon: [name], MD;  Location: [facility] ENDOSCOPY;  Service: Gastroenterology;  Laterality: N/A;   HEMORRHOID SURGERY     HERNIA REPAIR     HIP FRACTURE SURGERY     INTRAMEDULLARY (IM) NAIL INTERTROCHANTERIC Right 09/16/2018   Procedure: INTRAMEDULLARY (IM) NAIL INTERTROCHANTRIC;  Surgeon: [name], MD;  Location: [facility] ORS;  Service: Orthopedics;  Laterality: Right;   IR CATHETER TUBE CHANGE  11/22/2018   NASAL SINUS SURGERY     ORCHIECTOMY     TONSILLECTOMY     Patient Active Problem List   Diagnosis Date Noted   DVT femoral (deep venous thrombosis) with thrombophlebitis, left (HCC) 02/06/2020   Lymphedema 04/15/2019   Bilateral leg edema 01/25/2019   Hyponatremia  12/24/2018   SIADH (syndrome of inappropriate ADH production) (HCC) 12/24/2018   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) 10/27/2018   Generalized weakness 10/14/2018   Hip fracture (HCC) 09/15/2018   Moderate mitral insufficiency 08/16/2018   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   HLD (hyperlipidemia) 03/24/2018   Contusion of right knee 02/14/2018   Bradycardia 12/28/2017   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/03/2017   Tremor 09/04/2016   Chronic pain of right knee 08/10/2016   Right ankle pain 08/10/2016   Chronic venous insufficiency 05/13/2016   Non-Hodgkin's lymphoma (HCC) 12/11/2015   Urinary retention 09/08/2015   Lymphoma, non-Hodgkin's (HCC) 04/03/2015   Breathlessness on exertion 11/21/2014   Breath shortness 11/21/2014   Arthropathy 11/07/2014   Atrial flutter, paroxysmal (HCC) 11/07/2014   Type 2 diabetes mellitus (HCC) 11/07/2014   Benign essential tremor 11/07/2014   Benign essential HTN 11/07/2014   Mixed incontinence 11/07/2014   Hypercholesterolemia without hypertriglyceridemia 11/07/2014   Apnea, sleep 11/07/2014   Controlled type 2 diabetes mellitus without complication (HCC) 11/07/2014   Pure hypercholesterolemia 11/07/2014   Other abnormalities of gait and mobility 11/02/2011   Decreased mobility 11/02/2011   Abnormal gait 06/29/2011   Discoordination 06/29/2011   PCP: Maryjane Hurter,  Madaline Guthrie, MD  REFERRING PROVIDER: Marina Goodell, MD  REFERRING DIAG: Gait instability  THERAPY DIAG:  Abnormal posture  Gait difficulty  Balance problem  Muscle weakness (generalized)  Other lack of coordination  Rationale for Evaluation and Treatment: Rehabilitation  ONSET DATE: Chronic  SUBJECTIVE:   SUBJECTIVE STATEMENT: Pt. Known well to PT clinic.  Pt. Had a fall in August and requires assist to return to standing.  Pts. Wife reports pt. Is having increase  difficulty with standing/walking.  Pt. Using rollator for household mobility and reports limited tasks out of house.  Pt. C/o 1/10 R knee pain and no current treatment plan.  Pt. Has chronic h/o R knee pain and has tried injections/ brace in past with no benefit.  Pt. Able to walk into church with rollator but requires use of w/c when walking into [place] [employer].  Pt. Able to ascend stairs at son's house with assist and step to pattern.    PERTINENT HISTORY: See MD notes/ recent SLP and OT notes.    PAIN:  Are you having pain? Yes: NPRS scale: 1/10 Pain location: R knee Pain description: sharp/ aching Aggravating factors: standing/walking Relieving factors: sitting  PRECAUTIONS: Fall  RED FLAGS: Bowel or bladder incontinence: Yes:      WEIGHT BEARING RESTRICTIONS: No  FALLS:  Has patient fallen in last 6 months? Yes. Number of falls 1  LIVING ENVIRONMENT: Lives with: lives with their spouse Lives in: House/apartment Stairs: No Has following equipment at home: Environmental consultant - 4 wheeled and Grab bars  OCCUPATION: Retired  PLOF: Requires assistive device for independence  PATIENT GOALS: Improve LE strength/ gait/ balance.  Decrease fall risk.    NEXT MD VISIT: PRN  OBJECTIVE:  Note: Objective measures were completed at Evaluation unless otherwise noted.  PATIENT SURVEYS:  FOTO initial 29/goal 1  COGNITION: Overall cognitive status: Impaired     SENSATION: Not tested  EDEMA:  NT  MUSCLE LENGTH: NT  POSTURE: rounded shoulders and forward head  PALPATION: R knee joint line tenderness  LOWER EXTREMITY ROM:   B UE/ LE AROM WFL.  Pt. Pain limited with R knee flexion/extension OP.   LOWER EXTREMITY MMT:  MMT Right eval Left eval  Hip flexion 4- 4-  Hip extension    Hip abduction    Hip adduction    Hip internal rotation    Hip external rotation    Knee flexion 5 5  Knee extension 4 4  Ankle dorsiflexion 4 4  Ankle plantarflexion    Ankle  inversion    Ankle eversion     (Blank rows = not tested)  B shoulder: flexion 5/5, abduction 4/5, tricep 4+/5, bicep 5/5.    LOWER EXTREMITY SPECIAL TESTS:  NT  FUNCTIONAL TESTS:  5 times sit to stand: TBD 6 minute walk test: TBD Berg Balance Scale: 25/56 (significant fall risk)  GAIT: Distance walked: in clinic Assistive device utilized: Walker - 4 wheeled Level of assistance: Modified independence and SBA Comments: Antalgic gait pattern with heavy UE assist/ use of brakes on rollator.  Increase c/o R knee pain and fearful of R knee buckling.  High fall risk.  TREATMENT DATE: 12/29/2023  Subjective:    Pt. Entered PT with rollator and heavy UE assist for safety.  No new complaints or falls.  Pts. Wife explained upcoming care for pt. When she goes out of town in a few weeks.  No back pain prior to tx.  Using Biofreeze with 10% Lidocaine (?) on R knee for pain relief.    There.ex.: No charge  Nustep L4 11+ min. B LE only (0.5 miles).  MH to low back during Nustep.  Discussed upcoming weekend.    Seated marching/ LAQ (reassessment of R knee ROM).    Discussed HEP with pt./ wife.  There.act.:  Sit to stands on blue mat table (varying heights) with no UE assist.    Walking in //-bars with added Airex pad/ 6" hurdles for step ups/overs (4 laps forward/ 3 laps lateral with heavy UE assist on //-bars).  Banker.    Standing turning CW/CCW with light UE assist.  Posture correction/ SBA for safety.  Increase LBP with prolonged standing.    Standing tolerance: fishing (4 min. 33 sec. Before requested seated rest break).    Forward walking with marching and backwards walking with focus on hip extension 3 laps.    Amb. From //-bars to car with CGA for safety and cuing to correct posture/ step pattern.   Managing doing down outside ramp to car with new rollator.   Instructed pts. Wife on proper opening/ closing of rollator.  Pts. Wife demonstrates ability to push down on chair to snap it into place.     NOT TODAY 5# ankle wts.: Seated LAQ/ marching (limited with R hip flexion as compared to L) 20x each.  Standing wt. Shifting with 5# ankle wts. On and light UE assist.  STS from gray chair into //-bars.  Heavy UE assist and use of arm rests.  5# ankle wts.: Airex step ups/ downs with B UE assist on //-bars.  Attempted 1 UE only but limited L knee/quad muscle control during step up.    5# ankle wts.: forward/ backwards walking in //-bars (increase hip flexion/ step length)- 3 laps.  Lateral walking 3 laps with ankle wts.      PATIENT EDUCATION:  Education details: HEP Person educated: Patient and Spouse Education method: Medical illustrator Education comprehension: verbalized understanding and returned demonstration  HOME EXERCISE PROGRAM: See handouts  ASSESSMENT:  CLINICAL IMPRESSION: Today's treatment session focused on maintaining balance during dynamic free-standing activities. Pt. Is challenged by exercises limiting unilateral UE assistance.   Pt. Requires several short seated rest breaks and heavy use of B UE with sit to stands/ walking with rollator.  Increase c/o low back pain after standing tolerance tasks.  Pt. continues to ambulate increase distances during hallway tasks and while going to side of building/ managing ramp with rollator. Pt. Will benefit from skilled PT services to increase standing tolerance/ walking household distances with improved balance/ decrease fall risk.    OBJECTIVE IMPAIRMENTS: Abnormal gait, decreased activity tolerance, decreased balance, decreased endurance, decreased mobility, difficulty walking, decreased strength, hypomobility, improper body mechanics, postural dysfunction, and pain.   ACTIVITY LIMITATIONS: standing, stairs, transfers, and locomotion level  PARTICIPATION LIMITATIONS: community  activity and church  PERSONAL FACTORS: Fitness and Past/current experiences are also affecting patient's functional outcome.   REHAB POTENTIAL: Good  CLINICAL DECISION MAKING: Evolving/moderate complexity  EVALUATION COMPLEXITY: Moderate   GOALS: Goals reviewed with patient? Yes  SHORT TERM GOALS: Target date: 12/22/23 Pt. Will increase B hip/LE muscle strength  1/2 muscle grade to improve transfers/ standing tolerance/   Baseline:  see above Goal status: Partially met   LONG TERM GOALS: Target date: 01/26/24  Pt. Standing from standard height chair in rollator with 1 attempts and use of 1 UE assist safely. Baseline:  Extra time/ B UE assist.  Prefers pulling up Goal status: Initial  2.  Pt. Will increase Berg balance test score to >35/56 to decrease fall risk/ improve balance with walking.   Baseline: initial 25 (high fall risk) Goal status: On-going  3.  Pt. Able to ambulate from car into [facility] [employer] with use of rollator and SBA/mod. I safely to improve functional mobility.   Baseline: pt. Requires w/c with increase walking distances.  Goal status: Not met  PLAN:  PT FREQUENCY: 1x/week  PT DURATION: 8 weeks  PLANNED INTERVENTIONS: 97110-Therapeutic exercises, 97530- Therapeutic activity, [id number]- Neuromuscular re-education, 97535- Self Care, 16109- Manual therapy, [phone]- Gait training, Patient/Family education, Balance training, Stair training, Joint mobilization, Cryotherapy, and Moist heat  PLAN FOR NEXT SESSION:  Increase standing tolerance  Cammie Mcgee, PT, DPT # [phone] 12/29/2023, 8:35 PM

## 2024-01-02 ENCOUNTER — Ambulatory Visit (INDEPENDENT_AMBULATORY_CARE_PROVIDER_SITE_OTHER): Admitting: Physician Assistant

## 2024-01-02 DIAGNOSIS — Z466 Encounter for fitting and adjustment of urinary device: Secondary | ICD-10-CM

## 2024-01-02 NOTE — Progress Notes (Signed)
 Cath Change/ Replacement  Patient is present today for a catheter change due to urinary retention.  8ml of water was removed from the balloon, a 16FR coude foley cath was removed without difficulty.  Patient was cleaned and prepped in a sterile fashion with betadine and 2% lidocaine jelly was instilled into the urethra. A 16 FR coude foley cath was replaced into the bladder, no complications were noted. Urine return was noted 10ml and urine was yellow in color. The balloon was filled with 10ml of sterile water. A leg bag was attached for drainage.  Patient tolerated well.    Performed by: Carman Ching, PA-C   Follow up: Return in about 4 weeks (around 01/30/2024) for Catheter exchange.

## 2024-01-05 ENCOUNTER — Encounter: Payer: Self-pay | Admitting: Physical Therapy

## 2024-01-05 ENCOUNTER — Ambulatory Visit: Payer: Medicare Other | Admitting: Physical Therapy

## 2024-01-05 DIAGNOSIS — R293 Abnormal posture: Secondary | ICD-10-CM | POA: Diagnosis not present

## 2024-01-05 DIAGNOSIS — M6281 Muscle weakness (generalized): Secondary | ICD-10-CM

## 2024-01-05 DIAGNOSIS — R2689 Other abnormalities of gait and mobility: Secondary | ICD-10-CM

## 2024-01-05 DIAGNOSIS — R269 Unspecified abnormalities of gait and mobility: Secondary | ICD-10-CM

## 2024-01-05 DIAGNOSIS — R278 Other lack of coordination: Secondary | ICD-10-CM

## 2024-01-05 NOTE — Therapy (Signed)
 OUTPATIENT PHYSICAL THERAPY LOWER EXTREMITY TREATMENT  Patient Name: Mike Holloway MRN: [id number] DOB:[birth year], 83 y.o., male Today's Date: 01/05/2024  END OF SESSION:  PT End of Session - 01/05/24 1340     Visit Number 15    Number of Visits 19    Date for PT Re-Evaluation 01/26/24    PT Start Time 1340    PT Stop Time 1436    PT Time Calculation (min) 56 min    Activity Tolerance Patient tolerated treatment well             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   SIADH (syndrome of inappropriate ADH production) (HCC)    Sleep apnea    Stroke [facility])    Past Surgical History:  Procedure Laterality Date   ABLATION     APPENDECTOMY     CATARACT EXTRACTION, BILATERAL     COLONOSCOPY WITH PROPOFOL N/A 03/17/2018   Procedure: COLONOSCOPY WITH PROPOFOL;  Surgeon: [name], MD;  Location: [facility] 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   Procedure: FECAL TRANSPLANT;  Surgeon: [name], MD;  Location: [facility] ENDOSCOPY;  Service: Gastroenterology;  Laterality: N/A;   HEMORRHOID SURGERY     HERNIA REPAIR     HIP FRACTURE SURGERY     INTRAMEDULLARY (IM) NAIL INTERTROCHANTERIC Right 09/16/2018   Procedure: INTRAMEDULLARY (IM) NAIL INTERTROCHANTRIC;  Surgeon: [name], MD;  Location: [facility] ORS;  Service: Orthopedics;  Laterality: Right;   IR CATHETER TUBE CHANGE  11/22/2018   NASAL SINUS SURGERY     ORCHIECTOMY     TONSILLECTOMY     Patient Active Problem List   Diagnosis Date Noted   DVT femoral (deep venous thrombosis) with thrombophlebitis, left (HCC) 02/06/2020   Lymphedema 04/15/2019   Bilateral leg edema 01/25/2019   Hyponatremia  12/24/2018   SIADH (syndrome of inappropriate ADH production) (HCC) 12/24/2018   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) 10/27/2018   Generalized weakness 10/14/2018   Hip fracture (HCC) 09/15/2018   Moderate mitral insufficiency 08/16/2018   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   HLD (hyperlipidemia) 03/24/2018   Contusion of right knee 02/14/2018   Bradycardia 12/28/2017   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/03/2017   Tremor 09/04/2016   Chronic pain of right knee 08/10/2016   Right ankle pain 08/10/2016   Chronic venous insufficiency 05/13/2016   Non-Hodgkin's lymphoma (HCC) 12/11/2015   Urinary retention 09/08/2015   Lymphoma, non-Hodgkin's (HCC) 04/03/2015   Breathlessness on exertion 11/21/2014   Breath shortness 11/21/2014   Arthropathy 11/07/2014   Atrial flutter, paroxysmal (HCC) 11/07/2014   Type 2 diabetes mellitus (HCC) 11/07/2014   Benign essential tremor 11/07/2014   Benign essential HTN 11/07/2014   Mixed incontinence 11/07/2014   Hypercholesterolemia without hypertriglyceridemia 11/07/2014   Apnea, sleep 11/07/2014   Controlled type 2 diabetes mellitus without complication (HCC) 11/07/2014   Pure hypercholesterolemia 11/07/2014   Other abnormalities of gait and mobility 11/02/2011   Decreased mobility 11/02/2011   Abnormal gait 06/29/2011   Discoordination 06/29/2011   PCP: Maryjane Hurter,  Madaline Guthrie, MD  REFERRING PROVIDER: Marina Goodell, MD  REFERRING DIAG: Gait instability  THERAPY DIAG:  Abnormal posture  Gait difficulty  Balance problem  Muscle weakness (generalized)  Other lack of coordination  Rationale for Evaluation and Treatment: Rehabilitation  ONSET DATE: Chronic  SUBJECTIVE:   SUBJECTIVE STATEMENT: Pt. Known well to PT clinic.  Pt. Had a fall in August and requires assist to return to standing.  Pts. Wife reports pt. Is having increase  difficulty with standing/walking.  Pt. Using rollator for household mobility and reports limited tasks out of house.  Pt. C/o 1/10 R knee pain and no current treatment plan.  Pt. Has chronic h/o R knee pain and has tried injections/ brace in past with no benefit.  Pt. Able to walk into church with rollator but requires use of w/c when walking into [place] [employer].  Pt. Able to ascend stairs at son's house with assist and step to pattern.    PERTINENT HISTORY: See MD notes/ recent SLP and OT notes.    PAIN:  Are you having pain? Yes: NPRS scale: 1/10 Pain location: R knee Pain description: sharp/ aching Aggravating factors: standing/walking Relieving factors: sitting  PRECAUTIONS: Fall  RED FLAGS: Bowel or bladder incontinence: Yes:      WEIGHT BEARING RESTRICTIONS: No  FALLS:  Has patient fallen in last 6 months? Yes. Number of falls 1  LIVING ENVIRONMENT: Lives with: lives with their spouse Lives in: House/apartment Stairs: No Has following equipment at home: Environmental consultant - 4 wheeled and Grab bars  OCCUPATION: Retired  PLOF: Requires assistive device for independence  PATIENT GOALS: Improve LE strength/ gait/ balance.  Decrease fall risk.    NEXT MD VISIT: PRN  OBJECTIVE:  Note: Objective measures were completed at Evaluation unless otherwise noted.  PATIENT SURVEYS:  FOTO initial 29/goal 81  COGNITION: Overall cognitive status: Impaired     SENSATION: Not tested  EDEMA:  NT  MUSCLE LENGTH: NT  POSTURE: rounded shoulders and forward head  PALPATION: R knee joint line tenderness  LOWER EXTREMITY ROM:   B UE/ LE AROM WFL.  Pt. Pain limited with R knee flexion/extension OP.   LOWER EXTREMITY MMT:  MMT Right eval Left eval  Hip flexion 4- 4-  Hip extension    Hip abduction    Hip adduction    Hip internal rotation    Hip external rotation    Knee flexion 5 5  Knee extension 4 4  Ankle dorsiflexion 4 4  Ankle plantarflexion    Ankle  inversion    Ankle eversion     (Blank rows = not tested)  B shoulder: flexion 5/5, abduction 4/5, tricep 4+/5, bicep 5/5.    LOWER EXTREMITY SPECIAL TESTS:  NT  FUNCTIONAL TESTS:  5 times sit to stand: TBD 6 minute walk test: TBD Berg Balance Scale: 25/56 (significant fall risk)  GAIT: Distance walked: in clinic Assistive device utilized: Walker - 4 wheeled Level of assistance: Modified independence and SBA Comments: Antalgic gait pattern with heavy UE assist/ use of brakes on rollator.  Increase c/o R knee pain and fearful of R knee buckling.  High fall risk.  TREATMENT DATE: 01/05/2024  Subjective:    Pt. Entered PT with rollator and heavy UE assist for safety.  No back pain prior to tx.  Pt. Clarified that the Biofreeze has 10% Menthol, not Lidocaine.  Pt. Applied to R lateral knee prior to tx. Session.    There.ex.:   Nustep L4 10 min. B LE only.    MH to low back in chair after Nustep.  Discussed upcoming weekend with dtr./ grandson visiting.  Seated marching/ LAQ (reassessment of R knee ROM/ MMT).    Discussed HEP with pt./ wife.  There.act.:  Sit to stands in gray chair with Airex pad with no UE assist 10x.  R knee pain/limitations.     Walking in //-bars with added Airex pad/ 6" hurdles for step ups/overs (6 laps forward/ 3 laps lateral with heavy UE assist on //-bars).  Banker.  Standing turning CW/CCW with light UE assist.  Posture correction/ SBA for safety.  Increase LBP with prolonged standing.    Amb. From //-bars to car with CGA for safety and cuing to correct posture/ step pattern.   Managing doing down outside ramp to car with new rollator.  Instructed pts. Wife on proper opening/ closing of rollator.  Pts. Wife demonstrates ability to push down on chair to snap it into place.     PATIENT EDUCATION:  Education details: HEP Person educated:  Patient and Spouse Education method: Medical illustrator Education comprehension: verbalized understanding and returned demonstration  HOME EXERCISE PROGRAM: See handouts  ASSESSMENT:  CLINICAL IMPRESSION: Today's treatment session focused on maintaining balance during dynamic free-standing activities. Pt. Is challenged by exercises limiting unilateral UE assistance.   Pt. Requires several short seated rest breaks and heavy use of B UE with sit to stands/ walking with rollator.  No c/o low back pain during standing tolerance tasks.  Pt. continues to ambulate increase distances during hallway tasks and while going to side of building/ managing ramp with rollator. Pt. Will benefit from skilled PT services to increase standing tolerance/ walking household distances with improved balance/ decrease fall risk.    OBJECTIVE IMPAIRMENTS: Abnormal gait, decreased activity tolerance, decreased balance, decreased endurance, decreased mobility, difficulty walking, decreased strength, hypomobility, improper body mechanics, postural dysfunction, and pain.   ACTIVITY LIMITATIONS: standing, stairs, transfers, and locomotion level  PARTICIPATION LIMITATIONS: community activity and church  PERSONAL FACTORS: Fitness and Past/current experiences are also affecting patient's functional outcome.   REHAB POTENTIAL: Good  CLINICAL DECISION MAKING: Evolving/moderate complexity  EVALUATION COMPLEXITY: Moderate   GOALS: Goals reviewed with patient? Yes  SHORT TERM GOALS: Target date: 12/22/23 Pt. Will increase B hip/LE muscle strength 1/2 muscle grade to improve transfers/ standing tolerance/   Baseline:  see above Goal status: Partially met   LONG TERM GOALS: Target date: 01/26/24  Pt. Standing from standard height chair in rollator with 1 attempts and use of 1 UE assist safely. Baseline:  Extra time/ B UE assist.  Prefers pulling up Goal status: Initial  2.  Pt. Will increase Berg balance  test score to >35/56 to decrease fall risk/ improve balance with walking.   Baseline: initial 25 (high fall risk) Goal status: On-going  3.  Pt. Able to ambulate from car into [facility] [employer] with use of rollator and SBA/mod. I safely to improve functional mobility.   Baseline: pt. Requires w/c with increase walking distances.  Goal status: Not met  PLAN:  PT FREQUENCY: 1x/week  PT DURATION: 8 weeks  PLANNED INTERVENTIONS: 97110-Therapeutic  exercises, 97530- Therapeutic activity, [id number]- Neuromuscular re-education, [id number]- Self Care, 13086- Manual therapy, [phone]- Gait training, Patient/Family education, Balance training, Stair training, Joint mobilization, Cryotherapy, and Moist heat  PLAN FOR NEXT SESSION:  Increase standing tolerance/ check R lateral knee  Cammie Mcgee, PT, DPT # [phone] 01/05/2024, 2:37 PM

## 2024-01-06 ENCOUNTER — Ambulatory Visit: Admitting: Physician Assistant

## 2024-01-12 ENCOUNTER — Encounter: Payer: Self-pay | Admitting: Physical Therapy

## 2024-01-12 ENCOUNTER — Ambulatory Visit: Payer: Medicare Other | Admitting: Physical Therapy

## 2024-01-12 DIAGNOSIS — R293 Abnormal posture: Secondary | ICD-10-CM | POA: Diagnosis not present

## 2024-01-12 DIAGNOSIS — R269 Unspecified abnormalities of gait and mobility: Secondary | ICD-10-CM

## 2024-01-12 DIAGNOSIS — R2689 Other abnormalities of gait and mobility: Secondary | ICD-10-CM

## 2024-01-12 DIAGNOSIS — R278 Other lack of coordination: Secondary | ICD-10-CM

## 2024-01-12 DIAGNOSIS — M6281 Muscle weakness (generalized): Secondary | ICD-10-CM

## 2024-01-12 NOTE — Therapy (Signed)
 OUTPATIENT PHYSICAL THERAPY LOWER EXTREMITY TREATMENT  Patient Name: Mike Holloway MRN: [id number] DOB:22-Feb-1941, 83 y.o., male Today's Date: 01/13/2024  END OF SESSION:  PT End of Session - 01/12/24 1351     Visit Number 16    Number of Visits 19    Date for PT Re-Evaluation 01/26/24    PT Start Time 1344    PT Stop Time 1437    PT Time Calculation (min) 53 min    Activity Tolerance Patient tolerated treatment well             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   SIADH (syndrome of inappropriate ADH production) (HCC)    Sleep apnea    Stroke [facility])    Past Surgical History:  Procedure Laterality Date   ABLATION     APPENDECTOMY     CATARACT EXTRACTION, BILATERAL     COLONOSCOPY WITH PROPOFOL  N/A 03/17/2018   Procedure: COLONOSCOPY WITH PROPOFOL ;  Surgeon: Luke Salaam, MD;  Location: [facility] 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   Procedure: FECAL TRANSPLANT;  Surgeon: Luke Salaam, MD;  Location: [facility] ENDOSCOPY;  Service: Gastroenterology;  Laterality: N/A;   HEMORRHOID SURGERY     HERNIA REPAIR     HIP FRACTURE SURGERY     INTRAMEDULLARY (IM) NAIL INTERTROCHANTERIC Right 09/16/2018   Procedure: INTRAMEDULLARY (IM) NAIL INTERTROCHANTRIC;  Surgeon: Molli Angelucci, MD;  Location: [facility] ORS;  Service: Orthopedics;  Laterality: Right;   IR CATHETER TUBE CHANGE  11/22/2018   NASAL SINUS SURGERY     ORCHIECTOMY     TONSILLECTOMY     Patient Active Problem List   Diagnosis Date Noted   DVT femoral (deep venous thrombosis) with thrombophlebitis, left (HCC) 02/06/2020   Lymphedema 04/15/2019   Bilateral leg edema 01/25/2019   Hyponatremia  12/24/2018   SIADH (syndrome of inappropriate ADH production) (HCC) 12/24/2018   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) 10/27/2018   Generalized weakness 10/14/2018   Hip fracture (HCC) 09/15/2018   Moderate mitral insufficiency 08/16/2018   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   HLD (hyperlipidemia) 03/24/2018   Contusion of right knee 02/14/2018   Bradycardia 12/28/2017   Status post right unicompartmental knee replacement 11/03/2017   Tremor 09/04/2016   Chronic pain of right knee 08/10/2016   Right ankle pain 08/10/2016   Chronic venous insufficiency 05/13/2016   Non-Hodgkin's lymphoma (HCC) 12/11/2015   Urinary retention 09/08/2015   Lymphoma, non-Hodgkin's (HCC) 04/03/2015   Breathlessness on exertion 11/21/2014   Breath shortness 11/21/2014   Arthropathy 11/07/2014   Atrial flutter, paroxysmal (HCC) 11/07/2014   Type 2 diabetes mellitus (HCC) 11/07/2014   Benign essential tremor 11/07/2014   Benign essential HTN 11/07/2014   Mixed incontinence 11/07/2014   Hypercholesterolemia without hypertriglyceridemia 11/07/2014   Apnea, sleep 11/07/2014   Controlled type 2 diabetes mellitus without complication (HCC) 11/07/2014   Pure hypercholesterolemia 11/07/2014   Other abnormalities of gait and mobility 11/02/2011   Decreased mobility 11/02/2011   Abnormal gait 06/29/2011   Discoordination 06/29/2011   PCP: Samule Crofts,  Genetta Kenning, MD  REFERRING PROVIDER: Lorrie Rothman, MD  REFERRING DIAG: Gait instability  THERAPY DIAG:  Abnormal posture  Gait difficulty  Balance problem  Muscle weakness (generalized)  Other lack of coordination  Rationale for Evaluation and Treatment: Rehabilitation  ONSET DATE: Chronic  SUBJECTIVE:   SUBJECTIVE STATEMENT: Pt. Known well to PT clinic.  Pt. Had a fall in August and requires assist to return to standing.  Pts. Wife reports pt. Is having increase  difficulty with standing/walking.  Pt. Using rollator for household mobility and reports limited tasks out of house.  Pt. C/o 1/10 R knee pain and no current treatment plan.  Pt. Has chronic h/o R knee pain and has tried injections/ brace in past with no benefit.  Pt. Able to walk into church with rollator but requires use of w/c when walking into [employer].  Pt. Able to ascend stairs at son's house with assist and step to pattern.    PERTINENT HISTORY: See MD notes/ recent SLP and OT notes.    PAIN:  Are you having pain? Yes: NPRS scale: 1/10 Pain location: R knee Pain description: sharp/ aching Aggravating factors: standing/walking Relieving factors: sitting  PRECAUTIONS: Fall  RED FLAGS: Bowel or bladder incontinence: Yes:      WEIGHT BEARING RESTRICTIONS: No  FALLS:  Has patient fallen in last 6 months? Yes. Number of falls 1  LIVING ENVIRONMENT: Lives with: lives with their spouse Lives in: House/apartment Stairs: No Has following equipment at home: Environmental consultant - 4 wheeled and Grab bars  OCCUPATION: Retired  PLOF: Requires assistive device for independence  PATIENT GOALS: Improve LE strength/ gait/ balance.  Decrease fall risk.    NEXT MD VISIT: PRN  OBJECTIVE:  Note: Objective measures were completed at Evaluation unless otherwise noted.  PATIENT SURVEYS:  FOTO initial 29/goal 6  COGNITION: Overall cognitive status: Impaired     SENSATION: Not tested  EDEMA:  NT  MUSCLE LENGTH: NT  POSTURE: rounded shoulders and forward head  PALPATION: R knee joint line tenderness  LOWER EXTREMITY ROM:   B UE/ LE AROM WFL.  Pt. Pain limited with R knee flexion/extension OP.   LOWER EXTREMITY MMT:  MMT Right eval Left eval  Hip flexion 4- 4-  Hip extension    Hip abduction    Hip adduction    Hip internal rotation    Hip external rotation    Knee flexion 5 5  Knee extension 4 4  Ankle dorsiflexion 4 4  Ankle plantarflexion    Ankle  inversion    Ankle eversion     (Blank rows = not tested)  B shoulder: flexion 5/5, abduction 4/5, tricep 4+/5, bicep 5/5.    LOWER EXTREMITY SPECIAL TESTS:  NT  FUNCTIONAL TESTS:  5 times sit to stand: TBD 6 minute walk test: TBD Berg Balance Scale: 25/56 (significant fall risk)  GAIT: Distance walked: in clinic Assistive device utilized: Walker - 4 wheeled Level of assistance: Modified independence and SBA Comments: Antalgic gait pattern with heavy UE assist/ use of brakes on rollator.  Increase c/o R knee pain and fearful of R knee buckling.  High fall risk.  TREATMENT DATE: 01/13/2024  Subjective:    Pt. Entered PT with rollator and heavy UE assist for safety.  No back pain prior to tx.  Pt. Continues to be limited by R lateral knee pain with increase activity.  Pts. Dtr. Has been visiting this week and pts. Wife reports pt. Has been more active/ sitting at dining table.    There.ex.:   Nustep L4 10 min. B LE only (MH to low back).  Discussed upcoming weekend/ walking.    Seated marching/ LAQ 20x.     Standing 6"/12" step touches at stairs with UE assist on handrails.  Cuing to decrease UE assist.  20x before seated rest break.  6" step ups  Discussed HEP with pt./ wife.  There.act.:  Sit to stands from gray chair with Airex pad with min. To no UE assist 10x.  R knee pain/limitations.     Sit to stands from elevated blue mat table (working on increasing wt. Bearing on R LE and eccentric quad control during sitting).    Amb. From gym to car (at side of building) with CGA for safety and cuing to correct posture/ step pattern.   Managing going down outside ramp/ navigating sidewalk to car with rollator.  Pt. Requires extra time and cuing to increase recip. Step pattern, esp. While descending ramp.    PATIENT EDUCATION:  Education details: HEP Person educated:  Patient and Spouse Education method: Medical illustrator Education comprehension: verbalized understanding and returned demonstration  HOME EXERCISE PROGRAM: See handouts  ASSESSMENT:  CLINICAL IMPRESSION: Today's treatment session focused on maintaining balance during dynamic free-standing activities. Pt. Is challenged by exercises limiting unilateral UE assistance.   Pt. Requires several short seated rest breaks and heavy use of B UE with sit to stands/ step ups.  No c/o low back pain during standing tolerance tasks.  Pt. continues to ambulate increase distances during hallway tasks and while going to side of building/ managing ramp with rollator. Pt. Will benefit from skilled PT services to increase standing tolerance/ walking household distances with improved balance/ decrease fall risk.    OBJECTIVE IMPAIRMENTS: Abnormal gait, decreased activity tolerance, decreased balance, decreased endurance, decreased mobility, difficulty walking, decreased strength, hypomobility, improper body mechanics, postural dysfunction, and pain.   ACTIVITY LIMITATIONS: standing, stairs, transfers, and locomotion level  PARTICIPATION LIMITATIONS: community activity and church  PERSONAL FACTORS: Fitness and Past/current experiences are also affecting patient's functional outcome.   REHAB POTENTIAL: Good  CLINICAL DECISION MAKING: Evolving/moderate complexity  EVALUATION COMPLEXITY: Moderate   GOALS: Goals reviewed with patient? Yes  SHORT TERM GOALS: Target date: 12/22/23 Pt. Will increase B hip/LE muscle strength 1/2 muscle grade to improve transfers/ standing tolerance/   Baseline:  see above Goal status: Partially met   LONG TERM GOALS: Target date: 01/26/24  Pt. Standing from standard height chair in rollator with 1 attempts and use of 1 UE assist safely. Baseline:  Extra time/ B UE assist.  Prefers pulling up Goal status: Initial  2.  Pt. Will increase Berg balance test score to  >35/56 to decrease fall risk/ improve balance with walking.   Baseline: initial 25 (high fall risk) Goal status: On-going  3.  Pt. Able to ambulate from car into [employer] with use of rollator and SBA/mod. I safely to improve functional mobility.   Baseline: pt. Requires w/c with increase walking distances.  Goal status: Not met  PLAN:  PT FREQUENCY: 1x/week  PT DURATION: 8 weeks  PLANNED INTERVENTIONS: 97110-Therapeutic  exercises, 97530- Therapeutic activity, [id number]- Neuromuscular re-education, [phone]- Self Care, [id number]- Manual therapy, [phone]- Gait training, Patient/Family education, Balance training, Stair training, Joint mobilization, Cryotherapy, and Moist heat  PLAN FOR NEXT SESSION:  Increase standing tolerance/ CHECK SCHEDULE  Lendell Quarry, PT, DPT # [phone] 01/13/2024, 10:30 AM

## 2024-01-13 ENCOUNTER — Other Ambulatory Visit: Payer: Medicare Other

## 2024-01-14 ENCOUNTER — Emergency Department
Admission: EM | Admit: 2024-01-14 | Discharge: 2024-01-14 | Disposition: A | Discharge status: Home / Self Care | Attending: Emergency Medicine | Admitting: Emergency Medicine

## 2024-01-14 ENCOUNTER — Other Ambulatory Visit: Payer: Self-pay

## 2024-01-14 ENCOUNTER — Emergency Department

## 2024-01-14 DIAGNOSIS — R519 Headache, unspecified: Secondary | ICD-10-CM | POA: Diagnosis not present

## 2024-01-14 DIAGNOSIS — E119 Type 2 diabetes mellitus without complications: Secondary | ICD-10-CM | POA: Insufficient documentation

## 2024-01-14 DIAGNOSIS — W109XXA Fall (on) (from) unspecified stairs and steps, initial encounter: Secondary | ICD-10-CM | POA: Insufficient documentation

## 2024-01-14 DIAGNOSIS — S99911A Unspecified injury of right ankle, initial encounter: Secondary | ICD-10-CM | POA: Diagnosis present

## 2024-01-14 DIAGNOSIS — W19XXXA Unspecified fall, initial encounter: Secondary | ICD-10-CM

## 2024-01-14 DIAGNOSIS — S93401A Sprain of unspecified ligament of right ankle, initial encounter: Secondary | ICD-10-CM | POA: Insufficient documentation

## 2024-01-14 DIAGNOSIS — I10 Essential (primary) hypertension: Secondary | ICD-10-CM | POA: Insufficient documentation

## 2024-01-14 NOTE — ED Triage Notes (Signed)
 BIB [facility] from home for a fall. Pt is caox4. mechanical fall. hit his head. no thinners. ankle pain. denies loc. Pt has tremors chronic. Vitals wnl. 18 G LAC

## 2024-01-14 NOTE — ED Notes (Signed)
 Pt Aox4 cleared and discharged. All discharge teachings given to pt. Pt verbalizes back understanding of teachings. Departed via wheelchair with walker, with family

## 2024-01-14 NOTE — ED Provider Notes (Signed)
 [facility] Provider Note   Event Date/Time   First MD Initiated Contact with Patient 01/14/24 2021     (approximate) History  Fall  HPI Mike Holloway is a 83 y.o. male with a past medical history of type 2 diabetes, hypertension, essential tremor, and hypercholesterolemia who presents after mechanical fall down 1 step.  Patient states that as he was trying to descend steps he lost his balance falling backwards.  Patient states he felt like he twisted his right ankle as well as struck the back of his head.  Patient now complains of posterior headache and pain over the anterior lateral aspect of the right ankle. ROS: Patient currently denies any vision changes, tinnitus, difficulty speaking, facial droop, sore throat, chest pain, shortness of breath, abdominal pain, nausea/vomiting/diarrhea, dysuria, or weakness/numbness/paresthesias in any extremity   Physical Exam  Triage Vital Signs: ED Triage Vitals  Encounter Vitals Group     BP 01/14/24 2021 (!) 151/72     Systolic BP Percentile --      Diastolic BP Percentile --      Pulse Rate 01/14/24 2020 66     Resp 01/14/24 2020 16     Temp 01/14/24 2020 98 F (36.7 C)     Temp Source 01/14/24 2020 Oral     SpO2 01/14/24 2020 98 %     Weight --      Height 01/14/24 2018 5\' 8"  (1.727 m)     Head Circumference --      Peak Flow --      Pain Score 01/14/24 2018 0     Pain Loc --      Pain Education --      Exclude from Growth Chart --    Most recent vital signs: Vitals:   01/14/24 2021 01/14/24 2257  BP: (!) 151/72 (!) 160/84  Pulse: 69 75  Resp: 18 17  Temp: 98.4 F (36.9 C) 98 F (36.7 C)  SpO2: 97% 98%   General: Awake, oriented x4. CV:  Good peripheral perfusion.  Resp:  Normal effort.  Abd:  No distention.  Other:  Elderly overweight Caucasian male resting comfortably in no acute distress.  Benign generalized tremor.  Mild tenderness to palpation over the ATFL on the right ankle ED Results  / Procedures / Treatments  RADIOLOGY ED MD interpretation: CT of the head without contrast interpreted by me shows no evidence of acute abnormalities including no intracerebral hemorrhage, obvious masses, or significant edema CT of the cervical spine interpreted by me does not show any evidence of acute abnormalities including no acute fracture, malalignment, height loss, or dislocation X-ray of the right ankle independently interpreted and shows no evidence of fracture or dislocation -Agree with radiology assessment Official radiology report(s): No results found. PROCEDURES: Critical Care performed: No Procedures MEDICATIONS ORDERED IN ED: Medications - No data to display IMPRESSION / MDM / ASSESSMENT AND PLAN / ED COURSE  I reviewed the triage vital signs and the nursing notes.                             The patient is on the cardiac monitor to evaluate for evidence of arrhythmia and/or significant heart rate changes. Patient's presentation is most consistent with acute presentation with potential threat to life or bodily function. Presenting after a fall that occurred just prior to arrival, resulting in injury to the right ankle. The mechanism of injury was a  mechanical ground level fall without syncope or near-syncope. The current level of pain is moderate. There was no loss of consciousness, confusion, seizure, or memory impairment. There is not a laceration associated with the injury. Denies neck pain. The patient does take blood thinner medications. Denies vomiting, numbness/weakness, fever  Dispo: Discharge with PCP follow-up       FINAL CLINICAL IMPRESSION(S) / ED DIAGNOSES   Final diagnoses:  Fall, initial encounter  Sprain of right ankle, unspecified ligament, initial encounter   Rx / DC Orders   ED Discharge Orders     None      Note:  This document was prepared using Dragon voice recognition software and may include unintentional dictation errors.    Marian Meneely K, MD 01/16/24 [phone]

## 2024-01-16 ENCOUNTER — Encounter: Payer: Self-pay | Admitting: Physical Therapy

## 2024-01-16 ENCOUNTER — Ambulatory Visit: Admitting: Physical Therapy

## 2024-01-16 DIAGNOSIS — R278 Other lack of coordination: Secondary | ICD-10-CM

## 2024-01-16 DIAGNOSIS — R293 Abnormal posture: Secondary | ICD-10-CM | POA: Diagnosis not present

## 2024-01-16 DIAGNOSIS — R269 Unspecified abnormalities of gait and mobility: Secondary | ICD-10-CM

## 2024-01-16 DIAGNOSIS — R2689 Other abnormalities of gait and mobility: Secondary | ICD-10-CM

## 2024-01-16 DIAGNOSIS — M6281 Muscle weakness (generalized): Secondary | ICD-10-CM

## 2024-01-16 NOTE — Therapy (Signed)
 OUTPATIENT PHYSICAL THERAPY LOWER EXTREMITY TREATMENT  Patient Name: Mike Holloway MRN: [id number] DOB:[birth year], 83 y.o., male Today's Date: 01/16/2024  END OF SESSION:  PT End of Session - 01/16/24 1426     Visit Number 17    Number of Visits 19    Date for PT Re-Evaluation 01/26/24    PT Start Time 1426    PT Stop Time 1520    PT Time Calculation (min) 54 min    Activity Tolerance Patient tolerated treatment well             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   SIADH (syndrome of inappropriate ADH production) (HCC)    Sleep apnea    Stroke [facility])    Past Surgical History:  Procedure Laterality Date   ABLATION     APPENDECTOMY     CATARACT EXTRACTION, BILATERAL     COLONOSCOPY WITH PROPOFOL  N/A 03/17/2018   Procedure: COLONOSCOPY WITH PROPOFOL ;  Surgeon: Luke Salaam, MD;  Location: [facility] 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   Procedure: FECAL TRANSPLANT;  Surgeon: Luke Salaam, MD;  Location: [facility] ENDOSCOPY;  Service: Gastroenterology;  Laterality: N/A;   HEMORRHOID SURGERY     HERNIA REPAIR     HIP FRACTURE SURGERY     INTRAMEDULLARY (IM) NAIL INTERTROCHANTERIC Right 09/16/2018   Procedure: INTRAMEDULLARY (IM) NAIL INTERTROCHANTRIC;  Surgeon: Molli Angelucci, MD;  Location: [facility] ORS;  Service: Orthopedics;  Laterality: Right;   IR CATHETER TUBE CHANGE  11/22/2018   NASAL SINUS SURGERY     ORCHIECTOMY     TONSILLECTOMY     Patient Active Problem List   Diagnosis Date Noted   DVT femoral (deep venous thrombosis) with thrombophlebitis, left (HCC) 02/06/2020   Lymphedema 04/15/2019   Bilateral leg edema 01/25/2019   Hyponatremia  12/24/2018   SIADH (syndrome of inappropriate ADH production) (HCC) 12/24/2018   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) 10/27/2018   Generalized weakness 10/14/2018   Hip fracture (HCC) 09/15/2018   Moderate mitral insufficiency 08/16/2018   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   HLD (hyperlipidemia) 03/24/2018   Contusion of right knee 02/14/2018   Bradycardia 12/28/2017   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   Tremor 09/04/2016   Chronic pain of right knee 08/10/2016   Right ankle pain 08/10/2016   Chronic venous insufficiency 05/13/2016   Non-Hodgkin's lymphoma (HCC) 12/11/2015   Urinary retention 09/08/2015   Lymphoma, non-Hodgkin's (HCC) 04/03/2015   Breathlessness on exertion 11/21/2014   Breath shortness 11/21/2014   Arthropathy 11/07/2014   Atrial flutter, paroxysmal (HCC) 11/07/2014   Type 2 diabetes mellitus (HCC) 11/07/2014   Benign essential tremor 11/07/2014   Benign essential HTN 11/07/2014   Mixed incontinence 11/07/2014   Hypercholesterolemia without hypertriglyceridemia 11/07/2014   Apnea, sleep 11/07/2014   Controlled type 2 diabetes mellitus without complication (HCC) 11/07/2014   Pure hypercholesterolemia 11/07/2014   Other abnormalities of gait and mobility 11/02/2011   Decreased mobility 11/02/2011   Abnormal gait 06/29/2011   Discoordination 06/29/2011   PCP: Samule Crofts,  Genetta Kenning, MD  REFERRING PROVIDER: Lorrie Rothman, MD  REFERRING DIAG: Gait instability  THERAPY DIAG:  Abnormal posture  Gait difficulty  Balance problem  Muscle weakness (generalized)  Other lack of coordination  Rationale for Evaluation and Treatment: Rehabilitation  ONSET DATE: Chronic  SUBJECTIVE:   SUBJECTIVE STATEMENT: Pt. Known well to PT clinic.  Pt. Had a fall in August and requires assist to return to standing.  Pts. Wife reports pt. Is having increase  difficulty with standing/walking.  Pt. Using rollator for household mobility and reports limited tasks out of house.  Pt. C/o 1/10 R knee pain and no current treatment plan.  Pt. Has chronic h/o R knee pain and has tried injections/ brace in past with no benefit.  Pt. Able to walk into church with rollator but requires use of w/c when walking into [place] [employer].  Pt. Able to ascend stairs at son's house with assist and step to pattern.    PERTINENT HISTORY: See MD notes/ recent SLP and OT notes.    PAIN:  Are you having pain? Yes: NPRS scale: 1/10 Pain location: R knee Pain description: sharp/ aching Aggravating factors: standing/walking Relieving factors: sitting  PRECAUTIONS: Fall  RED FLAGS: Bowel or bladder incontinence: Yes:      WEIGHT BEARING RESTRICTIONS: No  FALLS:  Has patient fallen in last 6 months? Yes. Number of falls 1  LIVING ENVIRONMENT: Lives with: lives with their spouse Lives in: House/apartment Stairs: No Has following equipment at home: Environmental consultant - 4 wheeled and Grab bars  OCCUPATION: Retired  PLOF: Requires assistive device for independence  PATIENT GOALS: Improve LE strength/ gait/ balance.  Decrease fall risk.    NEXT MD VISIT: PRN  OBJECTIVE:  Note: Objective measures were completed at Evaluation unless otherwise noted.  PATIENT SURVEYS:  FOTO initial 29/goal 78  COGNITION: Overall cognitive status: Impaired     SENSATION: Not tested  EDEMA:  NT  MUSCLE LENGTH: NT  POSTURE: rounded shoulders and forward head  PALPATION: R knee joint line tenderness  LOWER EXTREMITY ROM:   B UE/ LE AROM WFL.  Pt. Pain limited with R knee flexion/extension OP.   LOWER EXTREMITY MMT:  MMT Right eval Left eval  Hip flexion 4- 4-  Hip extension    Hip abduction    Hip adduction    Hip internal rotation    Hip external rotation    Knee flexion 5 5  Knee extension 4 4  Ankle dorsiflexion 4 4  Ankle plantarflexion    Ankle  inversion    Ankle eversion     (Blank rows = not tested)  B shoulder: flexion 5/5, abduction 4/5, tricep 4+/5, bicep 5/5.    LOWER EXTREMITY SPECIAL TESTS:  NT  FUNCTIONAL TESTS:  5 times sit to stand: TBD 6 minute walk test: TBD Berg Balance Scale: 25/56 (significant fall risk)  GAIT: Distance walked: in clinic Assistive device utilized: Walker - 4 wheeled Level of assistance: Modified independence and SBA Comments: Antalgic gait pattern with heavy UE assist/ use of brakes on rollator.  Increase c/o R knee pain and fearful of R knee buckling.  High fall risk.  TREATMENT DATE: 01/16/2024  Subjective:    Pt. Entered PT with rollator and heavy UE assist for safety.  No back pain prior to tx.  Pt. Continues to be limited by R lateral knee pain with increase activity.  Pt. Had a fall on Saturday (see ER note)- PT assessed R ankle (no swelling/ good ROM).   There.ex.:   Nustep L4 12 min. B LE only (MH to low back).  Discussed recent fall/ PT assessed R ankle.    Seated marching/ LAQ 20x.     Standing hamstring curls 20x.  Standing high marching 20x.   Discussed HEP with pt./ wife.  There.act.:  Walking in //-bars with 6" step ups/downs with SBA for cuing.  Recip. Gait pattern with focus on L quad eccentric control during descending.  Cuing to correct posture  Standing tolerance in //-bars with no UE assist (wt. Shifting)- verbal cuing for posture/ head correction.    Amb. From gym to car (at side of building) with CGA for safety and cuing to correct posture/ step pattern.   Managing going down outside ramp/ navigating sidewalk to car with rollator.  Pt. Requires extra time and cuing to increase recip. Step pattern, esp. While descending ramp.    PATIENT EDUCATION:  Education details: HEP Person educated: Patient and Spouse Education method: Software engineer Education comprehension: verbalized understanding and returned demonstration  HOME EXERCISE PROGRAM: See handouts  ASSESSMENT:  CLINICAL IMPRESSION: Today's treatment session focused on maintaining balance during dynamic free-standing activities. Pt. Is challenged by exercises limiting unilateral UE assistance.   Pt. Requires several short seated rest breaks and heavy use of B UE with sit to stands/ step ups.  No c/o low back pain during standing tolerance tasks.  PT screened pts. R ankle secondary to recent fall at home.   Pt. Will benefit from skilled PT services to increase standing tolerance/ walking household distances with improved balance/ decrease fall risk.    OBJECTIVE IMPAIRMENTS: Abnormal gait, decreased activity tolerance, decreased balance, decreased endurance, decreased mobility, difficulty walking, decreased strength, hypomobility, improper body mechanics, postural dysfunction, and pain.   ACTIVITY LIMITATIONS: standing, stairs, transfers, and locomotion level  PARTICIPATION LIMITATIONS: community activity and church  PERSONAL FACTORS: Fitness and Past/current experiences are also affecting patient's functional outcome.   REHAB POTENTIAL: Good  CLINICAL DECISION MAKING: Evolving/moderate complexity  EVALUATION COMPLEXITY: Moderate   GOALS: Goals reviewed with patient? Yes  SHORT TERM GOALS: Target date: 12/22/23 Pt. Will increase B hip/LE muscle strength 1/2 muscle grade to improve transfers/ standing tolerance/   Baseline:  see above Goal status: Partially met   LONG TERM GOALS: Target date: 01/26/24  Pt. Standing from standard height chair in rollator with 1 attempts and use of 1 UE assist safely. Baseline:  Extra time/ B UE assist.  Prefers pulling up Goal status: Initial  2.  Pt. Will increase Berg balance test score to >35/56 to decrease fall risk/ improve balance with walking.   Baseline: initial 25 (high fall risk) Goal status:  On-going  3.  Pt. Able to ambulate from car into [facility] [employer] with use of rollator and SBA/mod. I safely to improve functional mobility.   Baseline: pt. Requires w/c with increase walking distances.  Goal status: Not met  PLAN:  PT FREQUENCY: 1x/week  PT DURATION: 8 weeks  PLANNED INTERVENTIONS: 97110-Therapeutic exercises, 97530- Therapeutic activity, 97112- Neuromuscular re-education, 97535- Self Care, 16109- Manual therapy, [id number]- Gait training, Patient/Family education, Balance training, Stair training, Joint mobilization, Cryotherapy,  and Moist heat  PLAN FOR NEXT SESSION:  Increase standing tolerance.  CHECK GOALS/ RECERT  Lendell Quarry, PT, DPT # [phone] 01/16/2024, 5:56 PM

## 2024-01-19 ENCOUNTER — Encounter: Payer: Medicare Other | Admitting: Physical Therapy

## 2024-01-30 ENCOUNTER — Ambulatory Visit (INDEPENDENT_AMBULATORY_CARE_PROVIDER_SITE_OTHER): Admitting: Physician Assistant

## 2024-01-30 DIAGNOSIS — Z466 Encounter for fitting and adjustment of urinary device: Secondary | ICD-10-CM | POA: Diagnosis not present

## 2024-01-30 NOTE — Progress Notes (Signed)
 Cath Change/ Replacement  Patient is present today for a catheter change due to urinary retention.  8ml of water was removed from the balloon, a 16FR coude foley cath was removed without difficulty.  Patient was cleaned and prepped in a sterile fashion with betadine and 2% lidocaine  jelly was instilled into the urethra. A 16 FR coude foley cath was replaced into the bladder, no complications were noted. Urine return was noted 20ml and urine was yellow in color. The balloon was filled with 10ml of sterile water. A leg bag was attached for drainage.  Patient tolerated well.    Performed by: Prabhleen Montemayor, PA-C   Follow up: Return in about 4 weeks (around 02/27/2024) for Catheter exchange.

## 2024-02-02 ENCOUNTER — Ambulatory Visit: Attending: Family Medicine | Admitting: Physical Therapy

## 2024-02-02 ENCOUNTER — Encounter: Payer: Self-pay | Admitting: Physical Therapy

## 2024-02-02 DIAGNOSIS — R278 Other lack of coordination: Secondary | ICD-10-CM | POA: Insufficient documentation

## 2024-02-02 DIAGNOSIS — R269 Unspecified abnormalities of gait and mobility: Secondary | ICD-10-CM | POA: Insufficient documentation

## 2024-02-02 DIAGNOSIS — R2689 Other abnormalities of gait and mobility: Secondary | ICD-10-CM | POA: Diagnosis present

## 2024-02-02 DIAGNOSIS — R293 Abnormal posture: Secondary | ICD-10-CM | POA: Diagnosis present

## 2024-02-02 DIAGNOSIS — M6281 Muscle weakness (generalized): Secondary | ICD-10-CM | POA: Diagnosis present

## 2024-02-02 NOTE — Therapy (Addendum)
 OUTPATIENT PHYSICAL THERAPY LOWER EXTREMITY TREATMENT/ RECERTIFICATION  Patient Name: Mike Holloway MRN: [id number] DOB:07-22-1941, 83 y.o., male Today's Date: 02/02/2024  END OF SESSION:  PT End of Session - 02/02/24 1354     Visit Number 18    Number of Visits 26    Date for PT Re-Evaluation 03/29/24    PT Start Time 1347    PT Stop Time 1437    PT Time Calculation (min) 50 min    Activity Tolerance Patient tolerated treatment well             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   SIADH (syndrome of inappropriate ADH production) (HCC)    Sleep apnea    Stroke [facility])    Past Surgical History:  Procedure Laterality Date   ABLATION     APPENDECTOMY     CATARACT EXTRACTION, BILATERAL     COLONOSCOPY WITH PROPOFOL  N/A 03/17/2018   Procedure: COLONOSCOPY WITH PROPOFOL ;  Surgeon: Luke Salaam, MD;  Location: [facility] 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   Procedure: FECAL TRANSPLANT;  Surgeon: Luke Salaam, MD;  Location: [facility] ENDOSCOPY;  Service: Gastroenterology;  Laterality: N/A;   HEMORRHOID SURGERY     HERNIA REPAIR     HIP FRACTURE SURGERY     INTRAMEDULLARY (IM) NAIL INTERTROCHANTERIC Right 09/16/2018   Procedure: INTRAMEDULLARY (IM) NAIL INTERTROCHANTRIC;  Surgeon: Molli Angelucci, MD;  Location: [facility] ORS;  Service: Orthopedics;  Laterality: Right;   IR CATHETER TUBE CHANGE  11/22/2018   NASAL SINUS SURGERY     ORCHIECTOMY     TONSILLECTOMY     Patient Active Problem List   Diagnosis Date Noted   DVT femoral (deep venous thrombosis) with thrombophlebitis, left (HCC) 02/06/2020   Lymphedema 04/15/2019   Bilateral leg edema 01/25/2019    Hyponatremia 12/24/2018   SIADH (syndrome of inappropriate ADH production) (HCC) 12/24/2018   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) 10/27/2018   Generalized weakness 10/14/2018   Hip fracture (HCC) 09/15/2018   Moderate mitral insufficiency 08/16/2018   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   HLD (hyperlipidemia) 03/24/2018   Contusion of right knee 02/14/2018   Bradycardia 12/28/2017   Status post right unicompartmental knee replacement 11/03/2017   Tremor 09/04/2016   Chronic pain of right knee 08/10/2016   Right ankle pain 08/10/2016   Chronic venous insufficiency 05/13/2016   Non-Hodgkin's lymphoma (HCC) 12/11/2015   Urinary retention 09/08/2015   Lymphoma, non-Hodgkin's (HCC) 04/03/2015   Breathlessness on exertion 11/21/2014   Breath shortness 11/21/2014   Arthropathy 11/07/2014   Atrial flutter, paroxysmal (HCC) 11/07/2014   Type 2 diabetes mellitus (HCC) 11/07/2014   Benign essential tremor 11/07/2014   Benign essential HTN 11/07/2014   Mixed incontinence 11/07/2014   Hypercholesterolemia without hypertriglyceridemia 11/07/2014   Apnea, sleep 11/07/2014   Controlled type 2 diabetes mellitus without complication (HCC) 11/07/2014   Pure hypercholesterolemia 11/07/2014   Other abnormalities of gait and mobility 11/02/2011   Decreased mobility 11/02/2011   Abnormal gait 06/29/2011   Discoordination 06/29/2011   PCP:  Lorrie Rothman, MD  REFERRING PROVIDER: Lorrie Rothman, MD  REFERRING DIAG: Gait instability  THERAPY DIAG:  Abnormal posture  Gait difficulty  Balance problem  Muscle weakness (generalized)  Other lack of coordination  Rationale for Evaluation and Treatment: Rehabilitation  ONSET DATE: Chronic  SUBJECTIVE:   SUBJECTIVE STATEMENT: Pt. Known well to PT clinic.  Pt. Had a fall in August and requires assist to return to standing.  Pts. Wife reports pt. Is  having increase difficulty with standing/walking.  Pt. Using rollator for household mobility and reports limited tasks out of house.  Pt. C/o 1/10 R knee pain and no current treatment plan.  Pt. Has chronic h/o R knee pain and has tried injections/ brace in past with no benefit.  Pt. Able to walk into church with rollator but requires use of w/c when walking into [place] [employer].  Pt. Able to ascend stairs at son's house with assist and step to pattern.    PERTINENT HISTORY: See MD notes/ recent SLP and OT notes.    PAIN:  Are you having pain? Yes: NPRS scale: 1/10 Pain location: R knee Pain description: sharp/ aching Aggravating factors: standing/walking Relieving factors: sitting  PRECAUTIONS: Fall  RED FLAGS: Bowel or bladder incontinence: Yes:     WEIGHT BEARING RESTRICTIONS: No  FALLS:  Has patient fallen in last 6 months? Yes. Number of falls 1  LIVING ENVIRONMENT: Lives with: lives with their spouse Lives in: House/apartment Stairs: No Has following equipment at home: Environmental consultant - 4 wheeled and Grab bars  OCCUPATION: Retired  PLOF: Requires assistive device for independence  PATIENT GOALS: Improve LE strength/ gait/ balance.  Decrease fall risk.    NEXT MD VISIT: PRN  OBJECTIVE:  Note: Objective measures were completed at Evaluation unless otherwise noted.  PATIENT SURVEYS:  FOTO initial 29/goal 9  COGNITION: Overall cognitive status: Impaired     SENSATION: Not tested  EDEMA:  NT  MUSCLE LENGTH: NT  POSTURE: rounded shoulders and forward head  PALPATION: R knee joint line tenderness  LOWER EXTREMITY ROM:   B UE/ LE AROM WFL.  Pt. Pain limited with R knee flexion/extension OP.   LOWER EXTREMITY MMT:  MMT Right eval Left eval  Hip flexion 4- 4-  Hip extension    Hip abduction    Hip adduction    Hip internal rotation    Hip external rotation    Knee flexion 5 5  Knee extension 4 4  Ankle dorsiflexion 4 4  Ankle plantarflexion     Ankle inversion    Ankle eversion     (Blank rows = not tested)  B shoulder: flexion 5/5, abduction 4/5, tricep 4+/5, bicep 5/5.    LOWER EXTREMITY SPECIAL TESTS:  NT  FUNCTIONAL TESTS:  5 times sit to stand: TBD 6 minute walk test: TBD Berg Balance Scale: 25/56 (significant fall risk)  GAIT: Distance walked: in clinic Assistive device utilized: Walker - 4 wheeled Level of assistance: Modified independence and SBA Comments: Antalgic gait pattern with heavy UE assist/ use of brakes on rollator.  Increase c/o R knee pain and fearful of R knee buckling.  High fall risk.  TREATMENT DATE: 02/02/2024  Subjective:    Pt. Entered PT with rollator and heavy UE assist for safety.  No back pain prior to tx.  Pt. Continues to be limited by R lateral knee pain with increase activity.  Pts. Wife was out of town the previous week and pt. Stayed with brother-in-law.  No issues or falls.    There.ex.:   Nustep L5 10 min. B LE only (0.34 miles)   Discussed pts. Week while wife was out of town.    Seated 5#: marching/ LAQ 20x.  Walking in clinic with rollator.   Walking in //-bars with 5# ankle wts. With focus on consistent hip flexion/ step pattern/ heel strike 3 laps.    Standing 5#: hamstring curls 20x.  Standing high marching 20x.   Step lunges in //-bars (5# ankle wts.) with B UE assist on handrails.    Discussed HEP with pt./ wife.  There.act.:  Walking in //-bars (5# ankle wts.) with 6" hurdles with SBA for cuing.  Recip. Gait pattern with focus on L quad eccentric control during descending.  Cuing to correct posture.  Standing tolerance in //-bars with no UE assist (wt. Shifting)- verbal cuing for posture/ head correction.    Functional reaching: cones on hospital table 9 cones x 2.    Amb. From gym to car (at side of building) with CGA for safety and cuing to correct  posture/ step pattern.   Managing going down outside ramp/ navigating sidewalk to car with rollator.  Pt. Requires extra time and cuing to increase recip. Step pattern, esp. While descending ramp.    PATIENT EDUCATION:  Education details: HEP Person educated: Patient and Spouse Education method: Medical illustrator Education comprehension: verbalized understanding and returned demonstration  HOME EXERCISE PROGRAM: See handouts  ASSESSMENT:  CLINICAL IMPRESSION: Today's treatment session focused on maintaining balance during dynamic free-standing activities. Pt. Is challenged by exercises limiting unilateral UE assistance.   Pt. Requires several short seated rest breaks and heavy use of B UE with sit to stands/ step ups.  1 episode of increase low back pain during standing functional reaching task (pt. Sat for a few minutes and pain resolved).  Pt. Will benefit from skilled PT services to increase standing tolerance/ walking household distances with improved balance/ decrease fall risk.    OBJECTIVE IMPAIRMENTS: Abnormal gait, decreased activity tolerance, decreased balance, decreased endurance, decreased mobility, difficulty walking, decreased strength, hypomobility, improper body mechanics, postural dysfunction, and pain.   ACTIVITY LIMITATIONS: standing, stairs, transfers, and locomotion level  PARTICIPATION LIMITATIONS: community activity and church  PERSONAL FACTORS: Fitness and Past/current experiences are also affecting patient's functional outcome.   REHAB POTENTIAL: Good  CLINICAL DECISION MAKING: Evolving/moderate complexity  EVALUATION COMPLEXITY: Moderate   GOALS: Goals reviewed with patient? Yes  LONG TERM GOALS: Target date: 03/29/24  Pt. Will increase B hip/LE muscle strength 1/2 muscle grade to improve transfers/ standing tolerance/   Baseline:  see above Goal status: Partially met  2.  Pt. Standing from standard height chair in rollator with 1 attempts  and use of 1 UE assist safely. Baseline:  Extra time/ B UE assist.  Prefers pulling up Goal status: Partially met  3.  Pt. Will increase Berg balance test score to >35/56 to decrease fall risk/ improve balance with walking.   Baseline: initial 25 (high fall risk) Goal status: On-going  4.  Pt. Able to ambulate from car into [facility] [employer] with use of rollator and SBA/mod. I safely to  improve functional mobility.   Baseline: pt. Requires w/c with increase walking distances.  Goal status: Not met  PLAN:  PT FREQUENCY: 1x/week  PT DURATION: 8 weeks  PLANNED INTERVENTIONS: 97110-Therapeutic exercises, 97530- Therapeutic activity, [id number]- Neuromuscular re-education, 97535- Self Care, 16109- Manual therapy, [phone]- Gait training, Patient/Family education, Balance training, Stair training, Joint mobilization, Cryotherapy, and Moist heat  PLAN FOR NEXT SESSION:  Increase standing tolerance.    Lendell Quarry, PT, DPT # [phone] 02/02/2024, 8:50 PM

## 2024-02-07 ENCOUNTER — Ambulatory Visit (INDEPENDENT_AMBULATORY_CARE_PROVIDER_SITE_OTHER): Payer: Medicare Other | Admitting: Podiatry

## 2024-02-07 DIAGNOSIS — B351 Tinea unguium: Secondary | ICD-10-CM

## 2024-02-07 DIAGNOSIS — M79674 Pain in right toe(s): Secondary | ICD-10-CM

## 2024-02-07 DIAGNOSIS — E119 Type 2 diabetes mellitus without complications: Secondary | ICD-10-CM | POA: Diagnosis not present

## 2024-02-07 DIAGNOSIS — Z794 Long term (current) use of insulin: Secondary | ICD-10-CM

## 2024-02-07 DIAGNOSIS — M79675 Pain in left toe(s): Secondary | ICD-10-CM

## 2024-02-07 NOTE — Progress Notes (Signed)
  Subjective:  Patient ID: Mike Holloway, male    DOB: 04-19-1941,  MRN: [id number]  Chief Complaint  Patient presents with   Nail Problem    Nail trim   83 y.o. male returns for the above complaint.  Patient presents with thickened elongated showing mycotic toenails x 10 mild pain on palpation ambulate for me to be done he is unable to do it himself.  He also has secondary complaint of getting diabetic shoes.  He is a diabetic with controlled A1c.  He would like to obtain diabetic shoes given his hammertoe contractures  Objective:  There were no vitals filed for this visit. Podiatric Exam: Vascular: dorsalis pedis and posterior tibial pulses are palpable bilateral. Capillary return is immediate. Temperature gradient is WNL. Skin turgor WNL  Sensorium: Normal Semmes Weinstein monofilament test. Normal tactile sensation bilaterally. Nail Exam: Pt has thick disfigured discolored nails with subungual debris noted bilateral entire nail hallux through fifth toenails.  Pain on palpation to the nails. Ulcer Exam: There is no evidence of ulcer or pre-ulcerative changes or infection. Orthopedic Exam: Muscle tone and strength are WNL. No limitations in general ROM. No crepitus or effusions noted.  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 No infection or ulcers    Assessment & Plan:   No diagnosis found.   Patient was evaluated and treated and all questions answered.  Hammertoe bilateral with underlying diabetes -All questions and concerns were discussed with the patient in extensive detail -Given the deformity and the contracture of the hammertoe in the setting of diabetes patient will benefit from diabetic shoes.  He will be scheduled to see Trish for diabetic shoes  Onychomycosis with pain  -Nails palliatively debrided as below. -Educated on self-care  Procedure: Nail  Debridement Rationale: pain  Type of Debridement: manual, sharp debridement. Instrumentation: Nail nipper, rotary burr. Number of Nails: 10  Procedures and Treatment: Consent by patient was obtained for treatment procedures. The patient understood the discussion of treatment and procedures well. All questions were answered thoroughly reviewed. Debridement of mycotic and hypertrophic toenails, 1 through 5 bilateral and clearing of subungual debris. No ulceration, no infection noted.  Return Visit-Office Procedure: Patient instructed to return to the office for a follow up visit 3 months for continued evaluation and treatment.  Tinnie Forehand, DPM    No follow-ups on file.

## 2024-02-09 ENCOUNTER — Ambulatory Visit: Admitting: Physical Therapy

## 2024-02-10 ENCOUNTER — Other Ambulatory Visit

## 2024-02-14 ENCOUNTER — Ambulatory Visit: Admitting: Physical Therapy

## 2024-02-14 ENCOUNTER — Encounter: Payer: Self-pay | Admitting: Physical Therapy

## 2024-02-14 DIAGNOSIS — R293 Abnormal posture: Secondary | ICD-10-CM | POA: Diagnosis not present

## 2024-02-14 DIAGNOSIS — M6281 Muscle weakness (generalized): Secondary | ICD-10-CM

## 2024-02-14 DIAGNOSIS — R2689 Other abnormalities of gait and mobility: Secondary | ICD-10-CM

## 2024-02-14 DIAGNOSIS — R269 Unspecified abnormalities of gait and mobility: Secondary | ICD-10-CM

## 2024-02-14 DIAGNOSIS — R278 Other lack of coordination: Secondary | ICD-10-CM

## 2024-02-14 NOTE — Therapy (Addendum)
 OUTPATIENT PHYSICAL THERAPY LOWER EXTREMITY TREATMENT  Patient Name: Mike Holloway MRN: [id number] DOB:[birth year], 83 y.o., male Today's Date: 02/14/2024  END OF SESSION:  PT End of Session - 02/14/24 1343     Visit Number 19    Number of Visits 26    Date for PT Re-Evaluation 03/29/24    PT Start Time 1343    PT Stop Time 1435    PT Time Calculation (min) 52 min    Activity Tolerance Patient tolerated treatment well             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   SIADH (syndrome of inappropriate ADH production) (HCC)    Sleep apnea    Stroke [facility])    Past Surgical History:  Procedure Laterality Date   ABLATION     APPENDECTOMY     CATARACT EXTRACTION, BILATERAL     COLONOSCOPY WITH PROPOFOL  N/A 03/17/2018   Procedure: COLONOSCOPY WITH PROPOFOL ;  Surgeon: Luke Salaam, MD;  Location: [facility] 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   Procedure: FECAL TRANSPLANT;  Surgeon: Luke Salaam, MD;  Location: [facility] ENDOSCOPY;  Service: Gastroenterology;  Laterality: N/A;   HEMORRHOID SURGERY     HERNIA REPAIR     HIP FRACTURE SURGERY     INTRAMEDULLARY (IM) NAIL INTERTROCHANTERIC Right 09/16/2018   Procedure: INTRAMEDULLARY (IM) NAIL INTERTROCHANTRIC;  Surgeon: Molli Angelucci, MD;  Location: [facility] ORS;  Service: Orthopedics;  Laterality: Right;   IR CATHETER TUBE CHANGE  11/22/2018   NASAL SINUS SURGERY     ORCHIECTOMY     TONSILLECTOMY     Patient Active Problem List   Diagnosis Date Noted   DVT femoral (deep venous thrombosis) with thrombophlebitis, left (HCC) 02/06/2020   Lymphedema 04/15/2019   Bilateral leg edema 01/25/2019   Hyponatremia  12/24/2018   SIADH (syndrome of inappropriate ADH production) (HCC) 12/24/2018   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) 10/27/2018   Generalized weakness 10/14/2018   Hip fracture (HCC) 09/15/2018   Moderate mitral insufficiency 08/16/2018   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   HLD (hyperlipidemia) 03/24/2018   Contusion of right knee 02/14/2018   Bradycardia 12/28/2017   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   Tremor 09/04/2016   Chronic pain of right knee 08/10/2016   Right ankle pain 08/10/2016   Chronic venous insufficiency 05/13/2016   Non-Hodgkin's lymphoma (HCC) 12/11/2015   Urinary retention 09/08/2015   Lymphoma, non-Hodgkin's (HCC) 04/03/2015   Breathlessness on exertion 11/21/2014   Breath shortness 11/21/2014   Arthropathy 11/07/2014   Atrial flutter, paroxysmal (HCC) 11/07/2014   Type 2 diabetes mellitus (HCC) 11/07/2014   Benign essential tremor 11/07/2014   Benign essential HTN 11/07/2014   Mixed incontinence 11/07/2014   Hypercholesterolemia without hypertriglyceridemia 11/07/2014   Apnea, sleep 11/07/2014   Controlled type 2 diabetes mellitus without complication (HCC) 11/07/2014   Pure hypercholesterolemia 11/07/2014   Other abnormalities of gait and mobility 11/02/2011   Decreased mobility 11/02/2011   Abnormal gait 06/29/2011   Discoordination 06/29/2011   PCP: Samule Crofts,  Genetta Kenning, MD  REFERRING PROVIDER: Lorrie Rothman, MD  REFERRING DIAG: Gait instability  THERAPY DIAG:  Abnormal posture  Gait difficulty  Balance problem  Muscle weakness (generalized)  Other lack of coordination  Rationale for Evaluation and Treatment: Rehabilitation  ONSET DATE: Chronic  SUBJECTIVE:   SUBJECTIVE STATEMENT: Pt. Known well to PT clinic.  Pt. Had a fall in August and requires assist to return to standing.  Pts. Wife reports pt. Is having increase  difficulty with standing/walking.  Pt. Using rollator for household mobility and reports limited tasks out of house.  Pt. C/o 1/10 R knee pain and no current treatment plan.  Pt. Has chronic h/o R knee pain and has tried injections/ brace in past with no benefit.  Pt. Able to walk into church with rollator but requires use of w/c when walking into [place] [employer].  Pt. Able to ascend stairs at son's house with assist and step to pattern.    PERTINENT HISTORY: See MD notes/ recent SLP and OT notes.    PAIN:  Are you having pain? Yes: NPRS scale: 1/10 Pain location: R knee Pain description: sharp/ aching Aggravating factors: standing/walking Relieving factors: sitting  PRECAUTIONS: Fall  RED FLAGS: Bowel or bladder incontinence: Yes:     WEIGHT BEARING RESTRICTIONS: No  FALLS:  Has patient fallen in last 6 months? Yes. Number of falls 1  LIVING ENVIRONMENT: Lives with: lives with their spouse Lives in: House/apartment Stairs: No Has following equipment at home: Environmental consultant - 4 wheeled and Grab bars  OCCUPATION: Retired  PLOF: Requires assistive device for independence  PATIENT GOALS: Improve LE strength/ gait/ balance.  Decrease fall risk.    NEXT MD VISIT: PRN  OBJECTIVE:  Note: Objective measures were completed at Evaluation unless otherwise noted.  PATIENT SURVEYS:  FOTO initial 29/goal 36  COGNITION: Overall cognitive status: Impaired     SENSATION: Not tested  EDEMA:  NT  MUSCLE LENGTH: NT  POSTURE: rounded shoulders and forward head  PALPATION: R knee joint line tenderness  LOWER EXTREMITY ROM:   B UE/ LE AROM WFL.  Pt. Pain limited with R knee flexion/extension OP.   LOWER EXTREMITY MMT:  MMT Right eval Left eval  Hip flexion 4- 4-  Hip extension    Hip abduction    Hip adduction    Hip internal rotation    Hip external rotation    Knee flexion 5 5  Knee extension 4 4  Ankle dorsiflexion 4 4  Ankle plantarflexion    Ankle  inversion    Ankle eversion     (Blank rows = not tested)  B shoulder: flexion 5/5, abduction 4/5, tricep 4+/5, bicep 5/5.    LOWER EXTREMITY SPECIAL TESTS:  NT  FUNCTIONAL TESTS:  5 times sit to stand: TBD 6 minute walk test: TBD Berg Balance Scale: 25/56 (significant fall risk)  GAIT: Distance walked: in clinic Assistive device utilized: Walker - 4 wheeled Level of assistance: Modified independence and SBA Comments: Antalgic gait pattern with heavy UE assist/ use of brakes on rollator.  Increase c/o R knee pain and fearful of R knee buckling.  High fall risk.  TREATMENT DATE: 02/14/2024  Subjective:    Pt. Continues to be limited by R lateral knee pain with increase activity. Pt. States he is walking more inside home.   PT encouraged pt. To walk outside with wife or son.   Pt. Had an EEG last week but results were compromised due to deep brain stimulator.     There.ex.:   Nustep L5 10 min. B LE only (0.35 miles)   Discussed pts. Walking/ activity at home.    Seated 5#: marching/ LAQ 20x.  Walking in clinic with rollator.   Walking in //-bars with 5# ankle wts. With focus on consistent hip flexion/ step pattern/ heel strike 3 laps.  Lateral walking in //-bars 3 laps.  Rest break required between forward/ lateral walking laps.    Standing 5#: hamstring curls 20x.  Standing high marching 20x.   Step lunges in //-bars (5# ankle wts.) with B UE assist on handrails.    There.act.:  Walking in gym with rollator 2 laps.  Walking at agility ladder with use of blue marks for consistent recip. Step pattern 4 laps.  Heavy UE assist on rollator.  Moderate during to prevent stopping rollator and focus on recip. Pattern/ cadence.   Standing tolerance in //-bars with no UE assist (wt. Shifting)- verbal cuing for posture/ head correction.   Increase low back pain with prolonged  standing.    Amb. From gym to car (at side of building) with CGA for safety and cuing to correct posture/ step pattern.   Managing going down outside ramp/ navigating sidewalk to car with rollator.  Pt. Requires extra time and cuing to increase recip. Step pattern, esp. While descending ramp.    PATIENT EDUCATION:  Education details: HEP Person educated: Patient and Spouse Education method: Medical illustrator Education comprehension: verbalized understanding and returned demonstration  HOME EXERCISE PROGRAM: See handouts  ASSESSMENT:  CLINICAL IMPRESSION: Pt. Works hard during tx. Session and requires B UE assist with all standing/ walking tasks. Pt. Requires several short seated rest breaks between standing there.ex./ walking in //-bars with ankle wts.  1 episode of increase low back pain during wt.shifting in //-bars.  Pt. Encouraged to walk more at home and walk outside with wife/son assist.  Pt. Will benefit from skilled PT services to increase standing tolerance/ walking household distances with improved balance/ decrease fall risk.    OBJECTIVE IMPAIRMENTS: Abnormal gait, decreased activity tolerance, decreased balance, decreased endurance, decreased mobility, difficulty walking, decreased strength, hypomobility, improper body mechanics, postural dysfunction, and pain.   ACTIVITY LIMITATIONS: standing, stairs, transfers, and locomotion level  PARTICIPATION LIMITATIONS: community activity and church  PERSONAL FACTORS: Fitness and Past/current experiences are also affecting patient's functional outcome.   REHAB POTENTIAL: Good  CLINICAL DECISION MAKING: Evolving/moderate complexity  EVALUATION COMPLEXITY: Moderate   GOALS: Goals reviewed with patient? Yes  LONG TERM GOALS: Target date: 03/29/24  Pt. Will increase B hip/LE muscle strength 1/2 muscle grade to improve transfers/ standing tolerance/   Baseline:  see above Goal status: Partially met  2.  Pt. Standing  from standard height chair in rollator with 1 attempts and use of 1 UE assist safely. Baseline:  Extra time/ B UE assist.  Prefers pulling up Goal status: Partially met  3.  Pt. Will increase Berg balance test score to >35/56 to decrease fall risk/ improve balance with walking.   Baseline: initial 25 (high fall risk) Goal status: On-going  4.  Pt. Able to ambulate from car into [place] of [employer]  meeting with use of rollator and SBA/mod. I safely to improve functional mobility.   Baseline: pt. Requires w/c with increase walking distances.  Goal status: Not met  PLAN:  PT FREQUENCY: 1x/week  PT DURATION: 8 weeks  PLANNED INTERVENTIONS: 97110-Therapeutic exercises, 97530- Therapeutic activity, [id number]- Neuromuscular re-education, 97535- Self Care, 16109- Manual therapy, [phone]- Gait training, Patient/Family education, Balance training, Stair training, Joint mobilization, Cryotherapy, and Moist heat  PLAN FOR NEXT SESSION:  Increase standing tolerance.    Lendell Quarry, PT, DPT # [phone] 02/14/2024, 6:33 PM

## 2024-02-23 ENCOUNTER — Ambulatory Visit: Admitting: Physical Therapy

## 2024-02-23 ENCOUNTER — Encounter: Payer: Self-pay | Admitting: Physical Therapy

## 2024-02-23 DIAGNOSIS — R293 Abnormal posture: Secondary | ICD-10-CM

## 2024-02-23 DIAGNOSIS — R269 Unspecified abnormalities of gait and mobility: Secondary | ICD-10-CM

## 2024-02-23 DIAGNOSIS — M6281 Muscle weakness (generalized): Secondary | ICD-10-CM

## 2024-02-23 DIAGNOSIS — R2689 Other abnormalities of gait and mobility: Secondary | ICD-10-CM

## 2024-02-23 DIAGNOSIS — R278 Other lack of coordination: Secondary | ICD-10-CM

## 2024-02-23 NOTE — Therapy (Signed)
 OUTPATIENT PHYSICAL THERAPY LOWER EXTREMITY TREATMENT Physical Therapy Progress Note  Dates of reporting period  12/01/23   to   02/23/24   Patient Name: Mike Holloway MRN: [id number] DOB:[birth year], 83 y.o., male Today's Date: 02/23/2024  END OF SESSION:  PT End of Session - 02/23/24 1349     Visit Number 20    Number of Visits 26    Date for PT Re-Evaluation 03/29/24    PT Start Time 1343    PT Stop Time 1438    PT Time Calculation (min) 55 min    Activity Tolerance Patient tolerated treatment well             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   SIADH (syndrome of inappropriate ADH production) (HCC)    Sleep apnea    Stroke [facility])    Past Surgical History:  Procedure Laterality Date   ABLATION     APPENDECTOMY     CATARACT EXTRACTION, BILATERAL     COLONOSCOPY WITH PROPOFOL  N/A 03/17/2018   Procedure: COLONOSCOPY WITH PROPOFOL ;  Surgeon: Luke Salaam, MD;  Location: [facility] 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   Procedure: FECAL TRANSPLANT;  Surgeon: Luke Salaam, MD;  Location: [facility] ENDOSCOPY;  Service: Gastroenterology;  Laterality: N/A;   HEMORRHOID SURGERY     HERNIA REPAIR     HIP FRACTURE SURGERY     INTRAMEDULLARY (IM) NAIL INTERTROCHANTERIC Right 09/16/2018   Procedure: INTRAMEDULLARY (IM) NAIL INTERTROCHANTRIC;  Surgeon: Molli Angelucci, MD;  Location: [facility] ORS;  Service: Orthopedics;  Laterality: Right;   IR CATHETER TUBE CHANGE  11/22/2018   NASAL SINUS SURGERY     ORCHIECTOMY     TONSILLECTOMY     Patient Active Problem List   Diagnosis Date Noted   DVT femoral (deep venous thrombosis) with thrombophlebitis, left (HCC)  02/06/2020   Lymphedema 04/15/2019   Bilateral leg edema 01/25/2019   Hyponatremia 12/24/2018   SIADH (syndrome of inappropriate ADH production) (HCC) 12/24/2018   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) 10/27/2018   Generalized weakness 10/14/2018   Hip fracture (HCC) 09/15/2018   Moderate mitral insufficiency 08/16/2018   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   HLD (hyperlipidemia) 03/24/2018   Contusion of right knee 02/14/2018   Bradycardia 12/28/2017   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/03/2017   Tremor 09/04/2016   Chronic pain of right knee 08/10/2016   Right ankle pain 08/10/2016   Chronic venous insufficiency 05/13/2016   Non-Hodgkin's lymphoma (HCC) 12/11/2015   Urinary retention 09/08/2015   Lymphoma, non-Hodgkin's (HCC) 04/03/2015   Breathlessness on exertion 11/21/2014   Breath shortness 11/21/2014   Arthropathy 11/07/2014   Atrial flutter, paroxysmal (HCC) 11/07/2014   Type 2 diabetes mellitus (HCC) 11/07/2014   Benign essential tremor 11/07/2014   Benign essential HTN 11/07/2014   Mixed incontinence 11/07/2014   Hypercholesterolemia without hypertriglyceridemia 11/07/2014   Apnea, sleep 11/07/2014   Controlled type 2 diabetes mellitus without complication (HCC) 11/07/2014   Pure hypercholesterolemia 11/07/2014   Other abnormalities of gait and mobility 11/02/2011  Decreased mobility 11/02/2011   Abnormal gait 06/29/2011   Discoordination 06/29/2011   PCP: Lorrie Rothman, MD  REFERRING PROVIDER: Lorrie Rothman, MD  REFERRING DIAG: Gait instability  THERAPY DIAG:  Abnormal posture  Gait difficulty  Balance problem  Muscle weakness (generalized)  Other lack of coordination  Rationale for Evaluation and Treatment: Rehabilitation  ONSET DATE: Chronic  SUBJECTIVE:   SUBJECTIVE STATEMENT: Pt. Known well to PT clinic.  Pt. Had a fall in August  and requires assist to return to standing.  Pts. Wife reports pt. Is having increase difficulty with standing/walking.  Pt. Using rollator for household mobility and reports limited tasks out of house.  Pt. C/o 1/10 R knee pain and no current treatment plan.  Pt. Has chronic h/o R knee pain and has tried injections/ brace in past with no benefit.  Pt. Able to walk into church with rollator but requires use of w/c when walking into [place] [employer].  Pt. Able to ascend stairs at son's house with assist and step to pattern.    PERTINENT HISTORY: See MD notes/ recent SLP and OT notes.    PAIN:  Are you having pain? Yes: NPRS scale: 1/10 Pain location: R knee Pain description: sharp/ aching Aggravating factors: standing/walking Relieving factors: sitting  PRECAUTIONS: Fall  RED FLAGS: Bowel or bladder incontinence: Yes:     WEIGHT BEARING RESTRICTIONS: No  FALLS:  Has patient fallen in last 6 months? Yes. Number of falls 1  LIVING ENVIRONMENT: Lives with: lives with their spouse Lives in: House/apartment Stairs: No Has following equipment at home: Environmental consultant - 4 wheeled and Grab bars  OCCUPATION: Retired  PLOF: Requires assistive device for independence  PATIENT GOALS: Improve LE strength/ gait/ balance.  Decrease fall risk.    NEXT MD VISIT: PRN  OBJECTIVE:  Note: Objective measures were completed at Evaluation unless otherwise noted.  PATIENT SURVEYS:  FOTO initial 29/goal 14  COGNITION: Overall cognitive status: Impaired     SENSATION: Not tested  EDEMA:  NT  MUSCLE LENGTH: NT  POSTURE: rounded shoulders and forward head  PALPATION: R knee joint line tenderness  LOWER EXTREMITY ROM:   B UE/ LE AROM WFL.  Pt. Pain limited with R knee flexion/extension OP.   LOWER EXTREMITY MMT:  MMT Right eval Left eval  Hip flexion 4- 4-  Hip extension    Hip abduction    Hip adduction    Hip internal rotation    Hip external rotation    Knee flexion 5 5   Knee extension 4 4  Ankle dorsiflexion 4 4  Ankle plantarflexion    Ankle inversion    Ankle eversion     (Blank rows = not tested)  B shoulder: flexion 5/5, abduction 4/5, tricep 4+/5, bicep 5/5.    LOWER EXTREMITY SPECIAL TESTS:  NT  FUNCTIONAL TESTS:  5 times sit to stand: TBD 6 minute walk test: TBD Berg Balance Scale: 25/56 (significant fall risk)  GAIT: Distance walked: in clinic Assistive device utilized: Walker - 4 wheeled Level of assistance: Modified independence and SBA Comments: Antalgic gait pattern with heavy UE assist/ use of brakes on rollator.  Increase c/o R knee pain and fearful of R knee buckling.  High fall risk.  TREATMENT DATE: 02/23/2024  Subjective:  Pt. Reports no new issues.  Pt. Brought in RW from home for PT to reassess.  PT adjusted height and recommends for home use.  There.ex.:   Nustep L5 10 min. B LE only (0.35 miles)   Discussed pts. Walking/ activity at home.    Seated 5#: marching/ LAQ 20x.  Walking in clinic with rollator.    Standing hip flexion with 5# ankle wts. 20x.    There.act.:  Walking in gym with rollator 2 laps (5# ankle wts).  Pt assess pts. Personal RW for home use for bathroom/ getting from car to house.  Walking at agility ladder with use of blue marks for consistent recip. Step pattern 4 laps.  Heavy UE assist on rollator.  Moderate cuing to prevent stopping rollator and focus on recip. Pattern/ cadence.   Standing at stairs: wt. Shifting with no UE assist.  6" step touches with 5# ankle wts. (Recip. Step touches).  UE assist required.    Standing tolerance in //-bars with no UE assist (wt. Shifting)- verbal cuing for posture/ head correction.   Increase low back pain with prolonged standing.    Walking in //-bars: lateral steps L/R 3 laps.    Amb. From gym to car (at side of building) with CGA for safety and  cuing to correct posture/ step pattern.   Managing going down outside ramp/ navigating sidewalk to car with rollator.  Pt. Requires extra time and cuing to increase recip. Step pattern, esp. While descending ramp.    PATIENT EDUCATION:  Education details: HEP Person educated: Patient and Spouse Education method: Medical illustrator Education comprehension: verbalized understanding and returned demonstration  HOME EXERCISE PROGRAM: See handouts  ASSESSMENT:  CLINICAL IMPRESSION: Pt. Works hard during tx. Session and requires B UE assist with all standing/ walking tasks. Pt. Requires several short seated rest breaks between standing there.ex./ walking in //-bars with ankle wts.  No increase low back pain today and pt. Able to tolerate 5 minutes standing/ wt. Shifting with no UE assist.  Pt. Encouraged to walk more at home and walk outside with wife/son assist.  Pt. Will benefit from skilled PT services to increase standing tolerance/ walking household distances with improved balance/ decrease fall risk.    OBJECTIVE IMPAIRMENTS: Abnormal gait, decreased activity tolerance, decreased balance, decreased endurance, decreased mobility, difficulty walking, decreased strength, hypomobility, improper body mechanics, postural dysfunction, and pain.   ACTIVITY LIMITATIONS: standing, stairs, transfers, and locomotion level  PARTICIPATION LIMITATIONS: community activity and church  PERSONAL FACTORS: Fitness and Past/current experiences are also affecting patient's functional outcome.   REHAB POTENTIAL: Good  CLINICAL DECISION MAKING: Evolving/moderate complexity  EVALUATION COMPLEXITY: Moderate   GOALS: Goals reviewed with patient? Yes  LONG TERM GOALS: Target date: 03/29/24  Pt. Will increase B hip/LE muscle strength 1/2 muscle grade to improve transfers/ standing tolerance/   Baseline:  see above Goal status: Partially met  2.  Pt. Standing from standard height chair in rollator  with 1 attempts and use of 1 UE assist safely. Baseline:  Extra time/ B UE assist.  Prefers pulling up Goal status: Partially met  3.  Pt. Will increase Berg balance test score to >35/56 to decrease fall risk/ improve balance with walking.   Baseline: initial 25 (high fall risk) Goal status: On-going  4.  Pt. Able to ambulate from car into [facility] [employer] with use of rollator and SBA/mod. I safely to improve functional mobility.   Baseline: pt. Requires  w/c with increase walking distances.  Goal status: Not met  PLAN:  PT FREQUENCY: 1x/week  PT DURATION: 8 weeks  PLANNED INTERVENTIONS: 97110-Therapeutic exercises, 97530- Therapeutic activity, [id number]- Neuromuscular re-education, 97535- Self Care, [id number]- Manual therapy, [phone]- Gait training, Patient/Family education, Balance training, Stair training, Joint mobilization, Cryotherapy, and Moist heat  PLAN FOR NEXT SESSION:  Increase standing tolerance.    Lendell Quarry, PT, DPT # [phone] 02/23/2024, 8:41 PM

## 2024-02-27 ENCOUNTER — Encounter: Payer: Self-pay | Admitting: Physical Therapy

## 2024-02-27 ENCOUNTER — Ambulatory Visit: Attending: Family Medicine | Admitting: Physical Therapy

## 2024-02-27 DIAGNOSIS — Z466 Encounter for fitting and adjustment of urinary device: Secondary | ICD-10-CM | POA: Insufficient documentation

## 2024-02-27 DIAGNOSIS — R278 Other lack of coordination: Secondary | ICD-10-CM | POA: Insufficient documentation

## 2024-02-27 DIAGNOSIS — R2689 Other abnormalities of gait and mobility: Secondary | ICD-10-CM | POA: Insufficient documentation

## 2024-02-27 DIAGNOSIS — M6281 Muscle weakness (generalized): Secondary | ICD-10-CM | POA: Insufficient documentation

## 2024-02-27 DIAGNOSIS — R269 Unspecified abnormalities of gait and mobility: Secondary | ICD-10-CM | POA: Insufficient documentation

## 2024-02-27 DIAGNOSIS — R293 Abnormal posture: Secondary | ICD-10-CM | POA: Insufficient documentation

## 2024-02-27 NOTE — Therapy (Signed)
 OUTPATIENT PHYSICAL THERAPY LOWER EXTREMITY TREATMENT  Patient Name: Mike Holloway MRN: [id number] DOB:1941-03-10, 83 y.o., male Today's Date: 02/27/2024  END OF SESSION:  PT End of Session - 02/27/24 1309     Visit Number 21    Number of Visits 26    Date for PT Re-Evaluation 03/29/24    PT Start Time 1258    PT Stop Time 1357    PT Time Calculation (min) 59 min    Activity Tolerance Patient tolerated treatment well             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   SIADH (syndrome of inappropriate ADH production) (HCC)    Sleep apnea    Stroke [facility])    Past Surgical History:  Procedure Laterality Date   ABLATION     APPENDECTOMY     CATARACT EXTRACTION, BILATERAL     COLONOSCOPY WITH PROPOFOL  N/A 03/17/2018   Procedure: COLONOSCOPY WITH PROPOFOL ;  Surgeon: Luke Salaam, MD;  Location: [facility] 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   Procedure: FECAL TRANSPLANT;  Surgeon: Luke Salaam, MD;  Location: [facility] ENDOSCOPY;  Service: Gastroenterology;  Laterality: N/A;   HEMORRHOID SURGERY     HERNIA REPAIR     HIP FRACTURE SURGERY     INTRAMEDULLARY (IM) NAIL INTERTROCHANTERIC Right 09/16/2018   Procedure: INTRAMEDULLARY (IM) NAIL INTERTROCHANTRIC;  Surgeon: Molli Angelucci, MD;  Location: [facility] ORS;  Service: Orthopedics;  Laterality: Right;   IR CATHETER TUBE CHANGE  11/22/2018   NASAL SINUS SURGERY     ORCHIECTOMY     TONSILLECTOMY     Patient Active Problem List   Diagnosis Date Noted   DVT femoral (deep venous thrombosis) with thrombophlebitis, left (HCC) 02/06/2020   Lymphedema 04/15/2019   Bilateral leg edema 01/25/2019   Hyponatremia  12/24/2018   SIADH (syndrome of inappropriate ADH production) (HCC) 12/24/2018   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) 10/27/2018   Generalized weakness 10/14/2018   Hip fracture (HCC) 09/15/2018   Moderate mitral insufficiency 08/16/2018   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   HLD (hyperlipidemia) 03/24/2018   Contusion of right knee 02/14/2018   Bradycardia 12/28/2017   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/03/2017   Tremor 09/04/2016   Chronic pain of right knee 08/10/2016   Right ankle pain 08/10/2016   Chronic venous insufficiency 05/13/2016   Non-Hodgkin's lymphoma (HCC) 12/11/2015   Urinary retention 09/08/2015   Lymphoma, non-Hodgkin's (HCC) 04/03/2015   Breathlessness on exertion 11/21/2014   Breath shortness 11/21/2014   Arthropathy 11/07/2014   Atrial flutter, paroxysmal (HCC) 11/07/2014   Type 2 diabetes mellitus (HCC) 11/07/2014   Benign essential tremor 11/07/2014   Benign essential HTN 11/07/2014   Mixed incontinence 11/07/2014   Hypercholesterolemia without hypertriglyceridemia 11/07/2014   Apnea, sleep 11/07/2014   Controlled type 2 diabetes mellitus without complication (HCC) 11/07/2014   Pure hypercholesterolemia 11/07/2014   Other abnormalities of gait and mobility 11/02/2011   Decreased mobility 11/02/2011   Abnormal gait 06/29/2011   Discoordination 06/29/2011   PCP: Samule Crofts,  Genetta Kenning, MD  REFERRING PROVIDER: Lorrie Rothman, MD  REFERRING DIAG: Gait instability  THERAPY DIAG:  Abnormal posture  Gait difficulty  Balance problem  Muscle weakness (generalized)  Other lack of coordination  Rationale for Evaluation and Treatment: Rehabilitation  ONSET DATE: Chronic  SUBJECTIVE:   SUBJECTIVE STATEMENT: Pt. Known well to PT clinic.  Pt. Had a fall in August and requires assist to return to standing.  Pts. Wife reports pt. Is having increase  difficulty with standing/walking.  Pt. Using rollator for household mobility and reports limited tasks out of house.  Pt. C/o 1/10 R knee pain and no current treatment plan.  Pt. Has chronic h/o R knee pain and has tried injections/ brace in past with no benefit.  Pt. Able to walk into church with rollator but requires use of w/c when walking into [place] [employer].  Pt. Able to ascend stairs at son's house with assist and step to pattern.    PERTINENT HISTORY: See MD notes/ recent SLP and OT notes.    PAIN:  Are you having pain? Yes: NPRS scale: 1/10 Pain location: R knee Pain description: sharp/ aching Aggravating factors: standing/walking Relieving factors: sitting  PRECAUTIONS: Fall  RED FLAGS: Bowel or bladder incontinence: Yes:     WEIGHT BEARING RESTRICTIONS: No  FALLS:  Has patient fallen in last 6 months? Yes. Number of falls 1  LIVING ENVIRONMENT: Lives with: lives with their spouse Lives in: House/apartment Stairs: No Has following equipment at home: Environmental consultant - 4 wheeled and Grab bars  OCCUPATION: Retired  PLOF: Requires assistive device for independence  PATIENT GOALS: Improve LE strength/ gait/ balance.  Decrease fall risk.    NEXT MD VISIT: PRN  OBJECTIVE:  Note: Objective measures were completed at Evaluation unless otherwise noted.  PATIENT SURVEYS:  FOTO initial 29/goal 46  COGNITION: Overall cognitive status: Impaired     SENSATION: Not tested  EDEMA:  NT  MUSCLE LENGTH: NT  POSTURE: rounded shoulders and forward head  PALPATION: R knee joint line tenderness  LOWER EXTREMITY ROM:   B UE/ LE AROM WFL.  Pt. Pain limited with R knee flexion/extension OP.   LOWER EXTREMITY MMT:  MMT Right eval Left eval  Hip flexion 4- 4-  Hip extension    Hip abduction    Hip adduction    Hip internal rotation    Hip external rotation    Knee flexion 5 5  Knee extension 4 4  Ankle dorsiflexion 4 4  Ankle plantarflexion    Ankle  inversion    Ankle eversion     (Blank rows = not tested)  B shoulder: flexion 5/5, abduction 4/5, tricep 4+/5, bicep 5/5.    LOWER EXTREMITY SPECIAL TESTS:  NT  FUNCTIONAL TESTS:  5 times sit to stand: TBD 6 minute walk test: TBD Berg Balance Scale: 25/56 (significant fall risk)  GAIT: Distance walked: in clinic Assistive device utilized: Walker - 4 wheeled Level of assistance: Modified independence and SBA Comments: Antalgic gait pattern with heavy UE assist/ use of brakes on rollator.  Increase c/o R knee pain and fearful of R knee buckling.  High fall risk.  TREATMENT DATE: 02/27/2024  Subjective:  Pt. Reports no new issues.  Pt. Entered PT with new sneakers with added lift.  Pts. Wife states orthotic mentioned shoes may be too big.      There.ex.:   Nustep L5 10 min. B LE only (0.38 miles)   Discussed pts. Walking/ activity at home.  Slight increase in R knee pain (3-4/10).  1/10 LBP.    Seated 5#: marching (challenged with R hip flexion)/ LAQ (R knee pain)/ heel raises 20x.    Sit to stands from gray chair in //-bars.  Heavy UE assist required.     There.act.:  Walking in gym with rollator 2 laps (5# ankle wts).  Recip. Gait pattern and consistent heel strike with new sneakers today.  Pt. Did well with new sneakers and supportive, even through shoes are big for pt. Pt. Prefers larger toe box secondary to toe/ forefoot discomfort.    Standing hip flexion with 5# ankle wts. At 6" steps 20x.  Heel strike/ toe strike.    Standing tolerance in //-bars with no UE assist (wt. Shifting)- verbal cuing for posture/ head correction.    Standing open lunges on L/R with shoulder flexion (single UE assist on //-bars for safety/ balance).  Pt. Pain limited in R knee with R LE lunge.     Amb. From gym to car (at side of building) with CGA for safety and cuing to correct posture/  step pattern.   Managing going down outside ramp/ navigating sidewalk to car with rollator.  Pt. Requires extra time and cuing to increase recip. Step pattern, esp. While descending ramp.    NOT TODAY: Walking at agility ladder with use of blue marks for consistent recip. Step pattern 4 laps.  Heavy UE assist on rollator.  Moderate cuing to prevent stopping rollator and focus on recip. Pattern/ cadence.   Walking in //-bars: lateral steps L/R 3 laps.  PATIENT EDUCATION:  Education details: HEP Person educated: Patient and Spouse Education method: Medical illustrator Education comprehension: verbalized understanding and returned demonstration  HOME EXERCISE PROGRAM: See handouts  ASSESSMENT:  CLINICAL IMPRESSION: Pt. Requires several short seated rest breaks between standing there.ex./ walking in //-bars with ankle wts.  No increase low back pain today but moderate R knee pain with step lunges/ sit to stands/ LAQ with ankle wts.  No LOB during tx. And good quad control during standing ex. In //-bars.   Pt. Encouraged to walk more at home and walk outside with wife/son assist.  Pt. Will benefit from skilled PT services to increase standing tolerance/ walking household distances with improved balance/ decrease fall risk.    OBJECTIVE IMPAIRMENTS: Abnormal gait, decreased activity tolerance, decreased balance, decreased endurance, decreased mobility, difficulty walking, decreased strength, hypomobility, improper body mechanics, postural dysfunction, and pain.   ACTIVITY LIMITATIONS: standing, stairs, transfers, and locomotion level  PARTICIPATION LIMITATIONS: community activity and church  PERSONAL FACTORS: Fitness and Past/current experiences are also affecting patient's functional outcome.   REHAB POTENTIAL: Good  CLINICAL DECISION MAKING: Evolving/moderate complexity  EVALUATION COMPLEXITY: Moderate   GOALS: Goals reviewed with patient? Yes  LONG TERM GOALS: Target  date: 03/29/24  Pt. Will increase B hip/LE muscle strength 1/2 muscle grade to improve transfers/ standing tolerance/   Baseline:  see above Goal status: Partially met  2.  Pt. Standing from standard height chair in rollator with 1 attempts and use of 1 UE assist safely. Baseline:  Extra time/ B UE assist.  Prefers pulling up Goal status:  Partially met  3.  Pt. Will increase Berg balance test score to >35/56 to decrease fall risk/ improve balance with walking.   Baseline: initial 25 (high fall risk) Goal status: On-going  4.  Pt. Able to ambulate from car into [facility] [employer] with use of rollator and SBA/mod. I safely to improve functional mobility.   Baseline: pt. Requires w/c with increase walking distances.  Goal status: Not met  PLAN:  PT FREQUENCY: 1x/week  PT DURATION: 8 weeks  PLANNED INTERVENTIONS: 97110-Therapeutic exercises, 97530- Therapeutic activity, [id number]- Neuromuscular re-education, 97535- Self Care, 16109- Manual therapy, [phone]- Gait training, Patient/Family education, Balance training, Stair training, Joint mobilization, Cryotherapy, and Moist heat  PLAN FOR NEXT SESSION:  Increase standing tolerance.    Lendell Quarry, PT, DPT # [phone] 02/27/2024, 6:49 PM

## 2024-03-01 ENCOUNTER — Ambulatory Visit (INDEPENDENT_AMBULATORY_CARE_PROVIDER_SITE_OTHER): Admitting: Physician Assistant

## 2024-03-01 DIAGNOSIS — Z466 Encounter for fitting and adjustment of urinary device: Secondary | ICD-10-CM | POA: Diagnosis not present

## 2024-03-01 NOTE — Progress Notes (Signed)
 Cath Change/ Replacement  Patient is present today for a catheter change due to urinary retention.  8ml of water was removed from the balloon, a 16FR coude foley cath was removed without difficulty.  Patient was cleaned and prepped in a sterile fashion with betadine and 2% lidocaine  jelly was instilled into the urethra. A 16 FR coude foley cath was replaced into the bladder, no complications were noted. Urine return was noted 3ml and urine was yellow in color. The balloon was filled with 10ml of sterile water. A leg bag was attached for drainage.  Patient tolerated well.    Performed by: Pinkie Manger, PA-C   Follow up: Return in about 4 weeks (around 03/29/2024) for Catheter exchange.

## 2024-03-09 ENCOUNTER — Other Ambulatory Visit

## 2024-03-12 ENCOUNTER — Ambulatory Visit: Admitting: Physical Therapy

## 2024-03-12 ENCOUNTER — Encounter: Payer: Self-pay | Admitting: Physical Therapy

## 2024-03-12 DIAGNOSIS — R2689 Other abnormalities of gait and mobility: Secondary | ICD-10-CM

## 2024-03-12 DIAGNOSIS — M6281 Muscle weakness (generalized): Secondary | ICD-10-CM

## 2024-03-12 DIAGNOSIS — R269 Unspecified abnormalities of gait and mobility: Secondary | ICD-10-CM

## 2024-03-12 DIAGNOSIS — R278 Other lack of coordination: Secondary | ICD-10-CM

## 2024-03-12 DIAGNOSIS — R293 Abnormal posture: Secondary | ICD-10-CM

## 2024-03-12 NOTE — Therapy (Signed)
 OUTPATIENT PHYSICAL THERAPY LOWER EXTREMITY TREATMENT  Patient Name: Mike Holloway MRN: [id number] DOB:02-23-1941, 83 y.o., male Today's Date: 03/12/2024  END OF SESSION:  PT End of Session - 03/12/24 1311     Visit Number 22    Number of Visits 26    Date for PT Re-Evaluation 03/29/24    PT Start Time 1302    PT Stop Time 1411    PT Time Calculation (min) 69 min    Activity Tolerance Patient tolerated treatment well          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   SIADH (syndrome of inappropriate ADH production) (HCC)    Sleep apnea    Stroke [facility])    Past Surgical History:  Procedure Laterality Date   ABLATION     APPENDECTOMY     CATARACT EXTRACTION, BILATERAL     COLONOSCOPY WITH PROPOFOL  N/A 03/17/2018   Procedure: COLONOSCOPY WITH PROPOFOL ;  Surgeon: Luke Salaam, MD;  Location: [facility] 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   Procedure: FECAL TRANSPLANT;  Surgeon: Luke Salaam, MD;  Location: [facility] ENDOSCOPY;  Service: Gastroenterology;  Laterality: N/A;   HEMORRHOID SURGERY     HERNIA REPAIR     HIP FRACTURE SURGERY     INTRAMEDULLARY (IM) NAIL INTERTROCHANTERIC Right 09/16/2018   Procedure: INTRAMEDULLARY (IM) NAIL INTERTROCHANTRIC;  Surgeon: Molli Angelucci, MD;  Location: [facility] ORS;  Service: Orthopedics;  Laterality: Right;   IR CATHETER TUBE CHANGE  11/22/2018   NASAL SINUS SURGERY     ORCHIECTOMY     TONSILLECTOMY     Patient Active Problem List   Diagnosis Date Noted   DVT femoral (deep venous thrombosis) with thrombophlebitis, left (HCC) 02/06/2020   Lymphedema 04/15/2019   Bilateral leg edema 01/25/2019   Hyponatremia  12/24/2018   SIADH (syndrome of inappropriate ADH production) (HCC) 12/24/2018   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) 10/27/2018   Generalized weakness 10/14/2018   Hip fracture (HCC) 09/15/2018   Moderate mitral insufficiency 08/16/2018   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   Recurrent Clostridium difficile diarrhea 03/24/2018   HLD (hyperlipidemia) 03/24/2018   Contusion of right knee 02/14/2018   Bradycardia 12/28/2017   Status post right unicompartmental knee replacement 11/03/2017   Tremor 09/04/2016   Chronic pain of right knee 08/10/2016   Right ankle pain 08/10/2016   Chronic venous insufficiency 05/13/2016   Non-Hodgkin's lymphoma (HCC) 12/11/2015   Urinary retention 09/08/2015   Lymphoma, non-Hodgkin's (HCC) 04/03/2015   Breathlessness on exertion 11/21/2014   Breath shortness 11/21/2014   Arthropathy 11/07/2014   Atrial flutter, paroxysmal (HCC) 11/07/2014   Type 2 diabetes mellitus (HCC) 11/07/2014   Benign essential tremor 11/07/2014   Benign essential HTN 11/07/2014   Mixed incontinence 11/07/2014   Hypercholesterolemia without hypertriglyceridemia 11/07/2014   Apnea, sleep 11/07/2014   Controlled type 2 diabetes mellitus without complication (HCC) 11/07/2014   Pure hypercholesterolemia 11/07/2014   Other abnormalities of gait and mobility 11/02/2011   Decreased mobility 11/02/2011   Abnormal gait 06/29/2011   Discoordination 06/29/2011   PCP: Lorrie Rothman, MD  REFERRING PROVIDER: Lorrie Rothman, MD  REFERRING DIAG: Gait instability  THERAPY DIAG:  Abnormal posture  Gait difficulty  Balance problem  Muscle weakness (generalized)  Other lack of coordination  Rationale for Evaluation and Treatment: Rehabilitation  ONSET DATE: Chronic  SUBJECTIVE:   SUBJECTIVE STATEMENT: Pt. Known well to PT clinic.  Pt. Had a fall in August and requires assist to return to standing.  Pts. Wife reports pt. Is having increase  difficulty with standing/walking.  Pt. Using rollator for household mobility and reports limited tasks out of house.  Pt. C/o 1/10 R knee pain and no current treatment plan.  Pt. Has chronic h/o R knee pain and has tried injections/ brace in past with no benefit.  Pt. Able to walk into church with rollator but requires use of w/c when walking into [place] [employer].  Pt. Able to ascend stairs at son's house with assist and step to pattern.    PERTINENT HISTORY: See MD notes/ recent SLP and OT notes.    PAIN:  Are you having pain? Yes: NPRS scale: 1/10 Pain location: R knee Pain description: sharp/ aching Aggravating factors: standing/walking Relieving factors: sitting  PRECAUTIONS: Fall  RED FLAGS: Bowel or bladder incontinence: Yes:     WEIGHT BEARING RESTRICTIONS: No  FALLS:  Has patient fallen in last 6 months? Yes. Number of falls 1  LIVING ENVIRONMENT: Lives with: lives with their spouse Lives in: House/apartment Stairs: No Has following equipment at home: Environmental consultant - 4 wheeled and Grab bars  OCCUPATION: Retired  PLOF: Requires assistive device for independence  PATIENT GOALS: Improve LE strength/ gait/ balance.  Decrease fall risk.    NEXT MD VISIT: PRN  OBJECTIVE:  Note: Objective measures were completed at Evaluation unless otherwise noted.  PATIENT SURVEYS:  FOTO initial 29/goal 67  COGNITION: Overall cognitive status: Impaired     SENSATION: Not tested  EDEMA:  NT  MUSCLE LENGTH: NT  POSTURE: rounded shoulders and forward head  PALPATION: R knee joint line tenderness  LOWER EXTREMITY ROM:   B UE/ LE AROM WFL.  Pt. Pain limited with R knee flexion/extension OP.   LOWER EXTREMITY MMT:  MMT Right eval Left eval  Hip flexion 4- 4-  Hip extension    Hip abduction    Hip adduction    Hip internal rotation    Hip external rotation    Knee flexion 5 5  Knee extension 4 4  Ankle dorsiflexion 4 4  Ankle plantarflexion    Ankle  inversion    Ankle eversion     (Blank rows = not tested)  B shoulder: flexion 5/5, abduction 4/5, tricep 4+/5, bicep 5/5.    LOWER EXTREMITY SPECIAL TESTS:  NT  FUNCTIONAL TESTS:  5 times sit to stand: TBD 6 minute walk test: TBD Berg Balance Scale: 25/56 (significant fall risk)  GAIT: Distance walked: in clinic Assistive device utilized: Walker - 4 wheeled Level of assistance: Modified independence and SBA Comments: Antalgic gait pattern with heavy UE assist/ use of brakes on rollator.  Increase c/o R knee pain and fearful of R knee buckling.  High fall risk.  TREATMENT DATE: 03/12/2024  Subjective:  Pt. C/o 1/10 R knee discomfort while walking into PT clinic.  No falls or LOB reported.  Pt. Had a nice Father's Day weekend and went to son's house yesterday.  Pt. Was able to ascend/descend stairs safely with use of handrail and son's assist.    There.ex.:   Nustep L5 10 min. B LE only (0.36 miles).  Warm-up/ discussed weekend activities and walking.    Seated 5#: marching (challenged with R hip flexion)/ LAQ (R knee pain)/ heel raises 20x.  Standing marching with focus on R hip flexion (requires UE assist).    Sit to stands from gray chair in //-bars.  Heavy UE assist required.  Step lunges on L/R in //-bars 5x each.       There.act.:  Walking in gym with rollator 2 laps (5# ankle wts).  Recip. Gait pattern and consistent heel strike with new sneakers today.  Good foot clearance while walking.  Using agility ladder for consistent step length/ heel strike.    Standing tolerance in //-bars with no UE assist (wt. Shifting)- verbal cuing for posture/ head correction.  Reaching for cones cross body.  Several small LOB requiring CGA and occasional contact with //-bars for safety.   Walking at agility ladder with use of blue marks for consistent recip. Step pattern 4 laps.   Heavy UE assist on rollator.  Moderate cuing to prevent stopping rollator and focus on recip. Pattern/ cadence.   Amb. In hallway with focus on upright posture and lighter UE assist on rollator.  Pt. Has difficulty relaxing UE and tends to elevated B UT (moderate cuing for correction).    Amb. From gym to car (at side of building) with CGA for safety and cuing to correct posture/ step pattern.   Managing going down outside ramp/ navigating sidewalk to car with rollator.  Pt. Requires extra time and cuing to increase recip. Step pattern, esp. While descending ramp.  No LOB and cuing to prevent stopping and focus on consistent gait.   PATIENT EDUCATION:  Education details: HEP Person educated: Patient and Spouse Education method: Medical illustrator Education comprehension: verbalized understanding and returned demonstration  HOME EXERCISE PROGRAM: See handouts  ASSESSMENT:  CLINICAL IMPRESSION: Pt. Requires several short seated rest breaks between between walking/ standing tolerance tasks in //-bars.  No increase low back pain today but moderate R knee pain with use of ankle wts. On LE with LAQ.  No LOB during tx. And good quad control during standing ex. In //-bars.   Pt. Encouraged to walk more at home and walk outside with wife/son assist.  Pt. Will benefit from skilled PT services to increase standing tolerance/ walking household distances with improved balance/ decrease fall risk.    OBJECTIVE IMPAIRMENTS: Abnormal gait, decreased activity tolerance, decreased balance, decreased endurance, decreased mobility, difficulty walking, decreased strength, hypomobility, improper body mechanics, postural dysfunction, and pain.   ACTIVITY LIMITATIONS: standing, stairs, transfers, and locomotion level  PARTICIPATION LIMITATIONS: community activity and church  PERSONAL FACTORS: Fitness and Past/current experiences are also affecting patient's functional outcome.   REHAB POTENTIAL:  Good  CLINICAL DECISION MAKING: Evolving/moderate complexity  EVALUATION COMPLEXITY: Moderate   GOALS: Goals reviewed with patient? Yes  LONG TERM GOALS: Target date: 03/29/24  Pt. Will increase B hip/LE muscle strength 1/2 muscle grade to improve transfers/ standing tolerance/   Baseline:  see above Goal status: Partially met  2.  Pt. Standing from standard height chair in rollator with 1 attempts and  use of 1 UE assist safely. Baseline:  Extra time/ B UE assist.  Prefers pulling up Goal status: Partially met  3.  Pt. Will increase Berg balance test score to >35/56 to decrease fall risk/ improve balance with walking.   Baseline: initial 25 (high fall risk) Goal status: On-going  4.  Pt. Able to ambulate from car into [facility] [employer] with use of rollator and SBA/mod. I safely to improve functional mobility.   Baseline: pt. Requires w/c with increase walking distances.  Goal status: Not met  PLAN:  PT FREQUENCY: 1x/week  PT DURATION: 8 weeks  PLANNED INTERVENTIONS: 97110-Therapeutic exercises, 97530- Therapeutic activity, [id number]- Neuromuscular re-education, 97535- Self Care, 16109- Manual therapy, [phone]- Gait training, Patient/Family education, Balance training, Stair training, Joint mobilization, Cryotherapy, and Moist heat  PLAN FOR NEXT SESSION:  Increase standing tolerance.    Lendell Quarry, PT, DPT # [phone] 03/12/2024, 2:27 PM

## 2024-03-16 ENCOUNTER — Other Ambulatory Visit

## 2024-03-16 ENCOUNTER — Ambulatory Visit

## 2024-03-16 DIAGNOSIS — M2141 Flat foot [pes planus] (acquired), right foot: Secondary | ICD-10-CM

## 2024-03-16 DIAGNOSIS — M2041 Other hammer toe(s) (acquired), right foot: Secondary | ICD-10-CM

## 2024-03-16 DIAGNOSIS — E119 Type 2 diabetes mellitus without complications: Secondary | ICD-10-CM

## 2024-03-19 ENCOUNTER — Ambulatory Visit: Admitting: Physical Therapy

## 2024-03-19 ENCOUNTER — Encounter: Payer: Self-pay | Admitting: Physical Therapy

## 2024-03-19 DIAGNOSIS — R269 Unspecified abnormalities of gait and mobility: Secondary | ICD-10-CM

## 2024-03-19 DIAGNOSIS — R293 Abnormal posture: Secondary | ICD-10-CM

## 2024-03-19 DIAGNOSIS — R278 Other lack of coordination: Secondary | ICD-10-CM

## 2024-03-19 DIAGNOSIS — R2689 Other abnormalities of gait and mobility: Secondary | ICD-10-CM

## 2024-03-19 DIAGNOSIS — M6281 Muscle weakness (generalized): Secondary | ICD-10-CM

## 2024-03-19 NOTE — Therapy (Signed)
 OUTPATIENT PHYSICAL THERAPY LOWER EXTREMITY TREATMENT  Patient Name: Mike Holloway MRN: [id number] DOB:08-17-1941, 83 y.o., male Today's Date: 03/20/2024  END OF SESSION:  PT End of Session - 03/19/24 1400     Visit Number 23    Number of Visits 26    Date for PT Re-Evaluation 03/29/24    PT Start Time 1428    PT Stop Time 1519    PT Time Calculation (min) 51 min    Activity Tolerance Patient tolerated treatment well          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   SIADH (syndrome of inappropriate ADH production) (HCC)    Sleep apnea    Stroke [facility])    Past Surgical History:  Procedure Laterality Date   ABLATION     APPENDECTOMY     CATARACT EXTRACTION, BILATERAL     COLONOSCOPY WITH PROPOFOL  N/A 03/17/2018   Procedure: COLONOSCOPY WITH PROPOFOL ;  Surgeon: Therisa Bi, MD;  Location: [facility] 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   Procedure: FECAL TRANSPLANT;  Surgeon: Therisa Bi, MD;  Location: [facility] ENDOSCOPY;  Service: Gastroenterology;  Laterality: N/A;   HEMORRHOID SURGERY     HERNIA REPAIR     HIP FRACTURE SURGERY     INTRAMEDULLARY (IM) NAIL INTERTROCHANTERIC Right 09/16/2018   Procedure: INTRAMEDULLARY (IM) NAIL INTERTROCHANTRIC;  Surgeon: Kathlynn Sharper, MD;  Location: [facility] ORS;  Service: Orthopedics;  Laterality: Right;   IR CATHETER TUBE CHANGE  11/22/2018   NASAL SINUS SURGERY     ORCHIECTOMY     TONSILLECTOMY     Patient Active Problem List   Diagnosis Date Noted   DVT femoral (deep venous thrombosis) with thrombophlebitis, left (HCC) 02/06/2020   Lymphedema 04/15/2019   Bilateral leg edema 01/25/2019   Hyponatremia  12/24/2018   SIADH (syndrome of inappropriate ADH production) (HCC) 12/24/2018   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) 10/27/2018   Generalized weakness 10/14/2018   Hip fracture (HCC) 09/15/2018   Moderate mitral insufficiency 08/16/2018   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   HLD (hyperlipidemia) 03/24/2018   Contusion of right knee 02/14/2018   Bradycardia 12/28/2017   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/03/2017   Tremor 09/04/2016   Chronic pain of right knee 08/10/2016   Right ankle pain 08/10/2016   Chronic venous insufficiency 05/13/2016   Non-Hodgkin's lymphoma (HCC) 12/11/2015   Urinary retention 09/08/2015   Lymphoma, non-Hodgkin's (HCC) 04/03/2015   Breathlessness on exertion 11/21/2014   Breath shortness 11/21/2014   Arthropathy 11/07/2014   Atrial flutter, paroxysmal (HCC) 11/07/2014   Type 2 diabetes mellitus (HCC) 11/07/2014   Benign essential tremor 11/07/2014   Benign essential HTN 11/07/2014   Mixed incontinence 11/07/2014   Hypercholesterolemia without hypertriglyceridemia 11/07/2014   Apnea, sleep 11/07/2014   Controlled type 2 diabetes mellitus without complication (HCC) 11/07/2014   Pure hypercholesterolemia 11/07/2014   Other abnormalities of gait and mobility 11/02/2011   Decreased mobility 11/02/2011   Abnormal gait 06/29/2011   Discoordination 06/29/2011   PCP: Jeffie Cheryl BRAVO, MD  REFERRING PROVIDER: Jeffie Cheryl BRAVO, MD  REFERRING DIAG: Gait instability  THERAPY DIAG:  Abnormal posture  Gait difficulty  Balance problem  Muscle weakness (generalized)  Other lack of coordination  Rationale for Evaluation and Treatment: Rehabilitation  ONSET DATE: Chronic  SUBJECTIVE:   SUBJECTIVE STATEMENT: Pt. Known well to PT clinic.  Pt. Had a fall in August and requires assist to return to standing.  Pts. Wife reports pt. Is having increase  difficulty with standing/walking.  Pt. Using rollator for household mobility and reports limited tasks out of house.  Pt. C/o 1/10 R knee pain and no current treatment plan.  Pt. Has chronic h/o R knee pain and has tried injections/ brace in past with no benefit.  Pt. Able to walk into church with rollator but requires use of w/c when walking into [place].  Pt. Able to ascend stairs at son's house with assist and step to pattern.    PERTINENT HISTORY: See MD notes/ recent SLP and OT notes.    PAIN:  Are you having pain? Yes: NPRS scale: 1/10 Pain location: R knee Pain description: sharp/ aching Aggravating factors: standing/walking Relieving factors: sitting  PRECAUTIONS: Fall  RED FLAGS: Bowel or bladder incontinence: Yes:     WEIGHT BEARING RESTRICTIONS: No  FALLS:  Has patient fallen in last 6 months? Yes. Number of falls 1  LIVING ENVIRONMENT: Lives with: lives with their spouse Lives in: House/apartment Stairs: No Has following equipment at home: Environmental consultant - 4 wheeled and Grab bars  OCCUPATION: Retired  PLOF: Requires assistive device for independence  PATIENT GOALS: Improve LE strength/ gait/ balance.  Decrease fall risk.    NEXT MD VISIT: PRN  OBJECTIVE:  Note: Objective measures were completed at Evaluation unless otherwise noted.  PATIENT SURVEYS:  FOTO initial 29/goal 5  COGNITION: Overall cognitive status: Impaired     SENSATION: Not tested  EDEMA:  NT  MUSCLE LENGTH: NT  POSTURE: rounded shoulders and forward head  PALPATION: R knee joint line tenderness  LOWER EXTREMITY ROM:   B UE/ LE AROM WFL.  Pt. Pain limited with R knee flexion/extension OP.   LOWER EXTREMITY MMT:  MMT Right eval Left eval  Hip flexion 4- 4-  Hip extension    Hip abduction    Hip adduction    Hip internal rotation    Hip external rotation    Knee flexion 5 5  Knee extension 4 4  Ankle dorsiflexion 4 4  Ankle plantarflexion    Ankle  inversion    Ankle eversion     (Blank rows = not tested)  B shoulder: flexion 5/5, abduction 4/5, tricep 4+/5, bicep 5/5.    LOWER EXTREMITY SPECIAL TESTS:  NT  FUNCTIONAL TESTS:  5 times sit to stand: TBD 6 minute walk test: TBD Berg Balance Scale: 25/56 (significant fall risk)  GAIT: Distance walked: in clinic Assistive device utilized: Walker - 4 wheeled Level of assistance: Modified independence and SBA Comments: Antalgic gait pattern with heavy UE assist/ use of brakes on rollator.  Increase c/o R knee pain and fearful of R knee buckling.  High fall risk.  TREATMENT DATE: 03/20/2024  Subjective:  Pt. C/o of R knee discomfort while walking into PT clinic/ using Nustep (no subjective pain score given).  No falls or LOB reported.   Discussed pts. Walking around house with pt./wife.    There.ex.:   Nustep L5 12 min. B LE only (0.45 miles).  Warm-up/ discussed weekend activities and walking.    Seated 5#: marching/ LAQ (R knee pain)/ heel and toe raises 20x.  Standing marching (good step length) with focus on R hip flexion (requires UE assist in //-bars).    Sit to stands from gray chair in //-bars.  Heavy UE assist required.   There.act.:  Walking cone taps in //-bars with heavy UE assist 2 laps (increase step length)- challenged with R hip flexion/ proprioception.      Standing tolerance with fishing 4 min. 30 sec.  (Increase LBP)  Standing step touches at 6 curb (5# ankle wts)- at stairs.    Walking in gym with rollator 2 laps (5# ankle wts).  Recip. Gait pattern and consistent heel strike with new sneakers today.  Good foot clearance while walking.  Using agility ladder for consistent step length/ heel strike.    Standing tolerance in //-bars with no UE assist (wt. Shifting)- verbal cuing for posture/ head correction.  1 LOB requiring UE assist on handrails to  self-correct.    Amb. In hallway with focus on upright posture and lighter UE assist on rollator.  Pt. Has difficulty relaxing UE and tends to elevated B UT (moderate cuing for correction).    Amb. From gym to car (at side of building) with CGA for safety and cuing to correct posture/ step pattern.   Managing going down outside ramp/ navigating sidewalk to car with rollator.  Pt. Requires extra time and cuing to increase recip. Step pattern, esp. While descending ramp.  No LOB and cuing to prevent stopping and focus on consistent gait.   PATIENT EDUCATION:  Education details: HEP Person educated: Patient and Spouse Education method: Medical illustrator Education comprehension: verbalized understanding and returned demonstration  HOME EXERCISE PROGRAM: See handouts  ASSESSMENT:  CLINICAL IMPRESSION: Pt. Requires several short seated rest breaks between between walking/ standing tolerance tasks in //-bars.  Slight increase in low back pain today with standing wt. Shifting/ prolonged standing tasks.  1 LOB during tx. And able to self-correct with handrail assist at stairs.  Pt. Encouraged to walk more at home and walk outside with wife/son assist.  Pt. Will benefit from skilled PT services to increase standing tolerance/ walking household distances with improved balance/ decrease fall risk.    OBJECTIVE IMPAIRMENTS: Abnormal gait, decreased activity tolerance, decreased balance, decreased endurance, decreased mobility, difficulty walking, decreased strength, hypomobility, improper body mechanics, postural dysfunction, and pain.   ACTIVITY LIMITATIONS: standing, stairs, transfers, and locomotion level  PARTICIPATION LIMITATIONS: community activity and church  PERSONAL FACTORS: Fitness and Past/current experiences are also affecting patient's functional outcome.   REHAB POTENTIAL: Good  CLINICAL DECISION MAKING: Evolving/moderate complexity  EVALUATION COMPLEXITY:  Moderate   GOALS: Goals reviewed with patient? Yes  LONG TERM GOALS: Target date: 03/29/24  Pt. Will increase B hip/LE muscle strength 1/2 muscle grade to improve transfers/ standing tolerance/   Baseline:  see above Goal status: Partially met  2.  Pt. Standing from standard height chair in rollator with 1 attempts and use of 1 UE assist safely. Baseline:  Extra time/ B UE assist.  Prefers pulling up Goal status: Partially met  3.  Pt.  Will increase Berg balance test score to >35/56 to decrease fall risk/ improve balance with walking.   Baseline: initial 25 (high fall risk) Goal status: On-going  4.  Pt. Able to ambulate from car into [facility] [employer] with use of rollator and SBA/mod. I safely to improve functional mobility.   Baseline: pt. Requires w/c with increase walking distances.  Goal status: Not met  PLAN:  PT FREQUENCY: 1x/week  PT DURATION: 8 weeks  PLANNED INTERVENTIONS: 97110-Therapeutic exercises, 97530- Therapeutic activity, [id number]- Neuromuscular re-education, 97535- Self Care, [id number]- Manual therapy, [phone]- Gait training, Patient/Family education, Balance training, Stair training, Joint mobilization, Cryotherapy, and Moist heat  PLAN FOR NEXT SESSION:  Increase standing tolerance.  CHECK SCHEDULE  Mike Holloway, PT, DPT # [phone] 03/20/2024, 8:32 AM

## 2024-03-26 ENCOUNTER — Encounter: Admitting: Physical Therapy

## 2024-03-27 ENCOUNTER — Ambulatory Visit: Attending: Family Medicine | Admitting: Physical Therapy

## 2024-03-27 DIAGNOSIS — M6281 Muscle weakness (generalized): Secondary | ICD-10-CM | POA: Insufficient documentation

## 2024-03-27 DIAGNOSIS — R269 Unspecified abnormalities of gait and mobility: Secondary | ICD-10-CM | POA: Insufficient documentation

## 2024-03-27 DIAGNOSIS — R293 Abnormal posture: Secondary | ICD-10-CM | POA: Insufficient documentation

## 2024-03-27 DIAGNOSIS — R2689 Other abnormalities of gait and mobility: Secondary | ICD-10-CM | POA: Insufficient documentation

## 2024-03-27 DIAGNOSIS — G250 Essential tremor: Secondary | ICD-10-CM | POA: Insufficient documentation

## 2024-03-27 DIAGNOSIS — R278 Other lack of coordination: Secondary | ICD-10-CM | POA: Insufficient documentation

## 2024-03-27 DIAGNOSIS — Z466 Encounter for fitting and adjustment of urinary device: Secondary | ICD-10-CM | POA: Insufficient documentation

## 2024-03-27 NOTE — Progress Notes (Signed)
 Shoe order waiting for family to call back with shoe choice  Heat moldable inserts 11.5 WD shoe ABN in my try on desk in [place]

## 2024-03-27 NOTE — Therapy (Signed)
 OUTPATIENT PHYSICAL THERAPY LOWER EXTREMITY TREATMENT  Patient Name: Mike Holloway MRN: [id number] DOB:Jun 29, 1941, 83 y.o., male Today's Date: 03/28/2024  END OF SESSION:  PT End of Session - 03/27/24 1612     Visit Number 24    Number of Visits 26    Date for PT Re-Evaluation 03/29/24    PT Start Time 1600    PT Stop Time 1653    PT Time Calculation (min) 53 min    Activity Tolerance Patient tolerated treatment well          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   SIADH (syndrome of inappropriate ADH production) (HCC)    Sleep apnea    Stroke [facility])    Past Surgical History:  Procedure Laterality Date   ABLATION     APPENDECTOMY     CATARACT EXTRACTION, BILATERAL     COLONOSCOPY WITH PROPOFOL  N/A 03/17/2018   Procedure: COLONOSCOPY WITH PROPOFOL ;  Surgeon: Therisa Bi, MD;  Location: [facility] 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   Procedure: FECAL TRANSPLANT;  Surgeon: Therisa Bi, MD;  Location: [facility] ENDOSCOPY;  Service: Gastroenterology;  Laterality: N/A;   HEMORRHOID SURGERY     HERNIA REPAIR     HIP FRACTURE SURGERY     INTRAMEDULLARY (IM) NAIL INTERTROCHANTERIC Right 09/16/2018   Procedure: INTRAMEDULLARY (IM) NAIL INTERTROCHANTRIC;  Surgeon: Kathlynn Sharper, MD;  Location: [facility] ORS;  Service: Orthopedics;  Laterality: Right;   IR CATHETER TUBE CHANGE  11/22/2018   NASAL SINUS SURGERY     ORCHIECTOMY     TONSILLECTOMY     Patient Active Problem List   Diagnosis Date Noted   DVT femoral (deep venous thrombosis) with thrombophlebitis, left (HCC) 02/06/2020   Lymphedema 04/15/2019   Bilateral leg edema 01/25/2019   Hyponatremia 12/24/2018    SIADH (syndrome of inappropriate ADH production) (HCC) 12/24/2018   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) 10/27/2018   Generalized weakness 10/14/2018   Hip fracture (HCC) 09/15/2018   Moderate mitral insufficiency 08/16/2018   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   HLD (hyperlipidemia) 03/24/2018   Contusion of right knee 02/14/2018   Bradycardia 12/28/2017   Status post right unicompartmental knee replacement 11/03/2017   Tremor 09/04/2016   Chronic pain of right knee 08/10/2016   Right ankle pain 08/10/2016   Chronic venous insufficiency 05/13/2016   Non-Hodgkin's lymphoma (HCC) 12/11/2015   Urinary retention 09/08/2015   Lymphoma, non-Hodgkin's (HCC) 04/03/2015   Breathlessness on exertion 11/21/2014   Breath shortness 11/21/2014   Arthropathy 11/07/2014   Atrial flutter, paroxysmal (HCC) 11/07/2014   Type 2 diabetes mellitus (HCC) 11/07/2014   Benign essential tremor 11/07/2014   Benign essential HTN 11/07/2014   Mixed incontinence 11/07/2014   Hypercholesterolemia without hypertriglyceridemia 11/07/2014   Apnea, sleep 11/07/2014   Controlled type 2 diabetes mellitus without complication (HCC) 11/07/2014   Pure hypercholesterolemia 11/07/2014   Other abnormalities of gait and mobility 11/02/2011   Decreased mobility 11/02/2011   Abnormal gait 06/29/2011   Discoordination 06/29/2011   PCP: Jeffie Cheryl BRAVO, MD  REFERRING PROVIDER: Jeffie Cheryl BRAVO, MD  REFERRING DIAG: Gait instability  THERAPY DIAG:  Abnormal posture  Gait difficulty  Balance problem  Muscle weakness (generalized)  Other lack of coordination  Rationale for Evaluation and Treatment: Rehabilitation  ONSET DATE: Chronic  SUBJECTIVE:   SUBJECTIVE STATEMENT: Pt. Known well to PT clinic.  Pt. Had a fall in August and requires assist to return to standing.  Pts. Wife reports pt. Is having increase difficulty  with standing/walking.  Pt. Using rollator for household mobility and reports limited tasks out of house.  Pt. C/o 1/10 R knee pain and no current treatment plan.  Pt. Has chronic h/o R knee pain and has tried injections/ brace in past with no benefit.  Pt. Able to walk into church with rollator but requires use of w/c when walking into [employer].  Pt. Able to ascend stairs at son's house with assist and step to pattern.    PERTINENT HISTORY: See MD notes/ recent SLP and OT notes.    PAIN:  Are you having pain? Yes: NPRS scale: 1/10 Pain location: R knee Pain description: sharp/ aching Aggravating factors: standing/walking Relieving factors: sitting  PRECAUTIONS: Fall  RED FLAGS: Bowel or bladder incontinence: Yes:     WEIGHT BEARING RESTRICTIONS: No  FALLS:  Has patient fallen in last 6 months? Yes. Number of falls 1  LIVING ENVIRONMENT: Lives with: lives with their spouse Lives in: House/apartment Stairs: No Has following equipment at home: Environmental consultant - 4 wheeled and Grab bars  OCCUPATION: Retired  PLOF: Requires assistive device for independence  PATIENT GOALS: Improve LE strength/ gait/ balance.  Decrease fall risk.    NEXT MD VISIT: PRN  OBJECTIVE:  Note: Objective measures were completed at Evaluation unless otherwise noted.  PATIENT SURVEYS:  FOTO initial 29/goal 30  COGNITION: Overall cognitive status: Impaired     SENSATION: Not tested  EDEMA:  NT  MUSCLE LENGTH: NT  POSTURE: rounded shoulders and forward head  PALPATION: R knee joint line tenderness  LOWER EXTREMITY ROM:   B UE/ LE AROM WFL.  Pt. Pain limited with R knee flexion/extension OP.   LOWER EXTREMITY MMT:  MMT Right eval Left eval  Hip flexion 4- 4-  Hip extension    Hip abduction    Hip adduction    Hip internal rotation    Hip external rotation    Knee flexion 5 5  Knee extension 4 4  Ankle dorsiflexion 4 4  Ankle plantarflexion    Ankle inversion     Ankle eversion     (Blank rows = not tested)  B shoulder: flexion 5/5, abduction 4/5, tricep 4+/5, bicep 5/5.    LOWER EXTREMITY SPECIAL TESTS:  NT  FUNCTIONAL TESTS:  5 times sit to stand: TBD 6 minute walk test: TBD Berg Balance Scale: 25/56 (significant fall risk)  GAIT: Distance walked: in clinic Assistive device utilized: Walker - 4 wheeled Level of assistance: Modified independence and SBA Comments: Antalgic gait pattern with heavy UE assist/ use of brakes on rollator.  Increase c/o R knee pain and fearful of R knee buckling.  High fall risk.  TREATMENT DATE: 03/28/2024  Subjective:  Pt. Reports no new complaints.  Pt. Has MD f/u with Dr. Jeffie coming up and scheduled to see Dr. Edie to assess R knee.    There.ex.:   Nustep L5 10 min. B LE only.  Warm-up/ discussed weekend activities and upcoming MD f/u appts.    Sit to stands from gray chair in //-bars.  Heavy UE assist required.   There.act.:  Standing wt. Shifting 10x with 1 UE assist (CGA for safety).  Pt. Unable to let go of //-bars without LOB and requires 1 UE assist to correct.    Walking in //-bars 5x with B UE assist with focus on increase step length/ heel strike.  Mirror feedback and cuing to relax B UT/ shoulder elevation  Resisted gait 2BTB 6x forward/backwards with UE assist required.  Cuing to correct upright posture and use mirror feedback.  Standing step touches at 6 curb with 1 UE assist and CGA for safety.  Heel and toe taps.  Challenged with toe taps.      Walking in gym with rollator and recip. Gait pattern with consistent heel strike.  Good foot clearance while walking.  Using agility ladder for consistent step length/ heel strike.    Standing tolerance in //-bars with no UE assist (wt. Shifting)- verbal cuing for posture/ head correction.  1 LOB requiring UE assist on handrails to  self-correct.    Amb. In hallway with focus on upright posture and lighter UE assist on rollator.  Pt. Has difficulty relaxing UE and tends to elevated B UT (moderate cuing for correction).    Amb. From gym to car (at side of building) with CGA for safety and cuing to correct posture/ step pattern.   Managing going down outside ramp/ navigating sidewalk to car with rollator.  Pt. Requires extra time and cuing to increase recip. Step pattern, esp. While descending ramp.  No LOB and cuing to prevent stopping and focus on consistent gait.   PATIENT EDUCATION:  Education details: HEP Person educated: Patient and Spouse Education method: Medical illustrator Education comprehension: verbalized understanding and returned demonstration  HOME EXERCISE PROGRAM: See handouts  ASSESSMENT:  CLINICAL IMPRESSION: Pt. Requires several short seated rest breaks between between walking/ standing tolerance tasks in //-bars.  1 LOB during wt. Shifting task And able to self-correct with handrail assist at stairs.  Pt. Encouraged to walk more at home and walk outside with wife/son assist.  Pt. Will benefit from skilled PT services to increase standing tolerance/ walking household distances with improved balance/ decrease fall risk.    OBJECTIVE IMPAIRMENTS: Abnormal gait, decreased activity tolerance, decreased balance, decreased endurance, decreased mobility, difficulty walking, decreased strength, hypomobility, improper body mechanics, postural dysfunction, and pain.   ACTIVITY LIMITATIONS: standing, stairs, transfers, and locomotion level  PARTICIPATION LIMITATIONS: community activity and church  PERSONAL FACTORS: Fitness and Past/current experiences are also affecting patient's functional outcome.   REHAB POTENTIAL: Good  CLINICAL DECISION MAKING: Evolving/moderate complexity  EVALUATION COMPLEXITY: Moderate   GOALS: Goals reviewed with patient? Yes  LONG TERM GOALS: Target date:  03/29/24  Pt. Will increase B hip/LE muscle strength 1/2 muscle grade to improve transfers/ standing tolerance/   Baseline:  see above Goal status: Partially met  2.  Pt. Standing from standard height chair in rollator with 1 attempts and use of 1 UE assist safely. Baseline:  Extra time/ B UE assist.  Prefers pulling up Goal status: Partially met  3.  Pt. Will increase Berg balance  test score to >35/56 to decrease fall risk/ improve balance with walking.   Baseline: initial 25 (high fall risk) Goal status: On-going  4.  Pt. Able to ambulate from car into [facility] [employer] with use of rollator and SBA/mod. I safely to improve functional mobility.   Baseline: pt. Requires w/c with increase walking distances.  Goal status: Not met  PLAN:  PT FREQUENCY: 1x/week  PT DURATION: 8 weeks  PLANNED INTERVENTIONS: 97110-Therapeutic exercises, 97530- Therapeutic activity, [id number]- Neuromuscular re-education, 97535- Self Care, 02859- Manual therapy, [phone]- Gait training, Patient/Family education, Balance training, Stair training, Joint mobilization, Cryotherapy, and Moist heat  PLAN FOR NEXT SESSION:  Increase standing tolerance.  RECERT/ CHECK GOALS next tx.    Ozell JAYSON Sero, PT, DPT # [phone] 03/28/2024, 6:38 PM

## 2024-04-02 ENCOUNTER — Ambulatory Visit: Admitting: Physician Assistant

## 2024-04-04 ENCOUNTER — Encounter: Admitting: Physical Therapy

## 2024-04-10 ENCOUNTER — Encounter: Payer: Self-pay | Admitting: Physical Therapy

## 2024-04-10 ENCOUNTER — Ambulatory Visit

## 2024-04-10 DIAGNOSIS — M6281 Muscle weakness (generalized): Secondary | ICD-10-CM

## 2024-04-10 DIAGNOSIS — R269 Unspecified abnormalities of gait and mobility: Secondary | ICD-10-CM

## 2024-04-10 DIAGNOSIS — R278 Other lack of coordination: Secondary | ICD-10-CM

## 2024-04-10 DIAGNOSIS — R2689 Other abnormalities of gait and mobility: Secondary | ICD-10-CM

## 2024-04-10 DIAGNOSIS — R293 Abnormal posture: Secondary | ICD-10-CM

## 2024-04-10 NOTE — Therapy (Signed)
 OUTPATIENT PHYSICAL THERAPY LOWER EXTREMITY TREATMENT & RE-CERTIFICATION  Patient Name: Mike Holloway MRN: [id number] DOB:23-Nov-1940, 83 y.o., male Today's Date: 04/10/2024  END OF SESSION:  PT End of Session - 04/10/24 1555     Visit Number 25    Number of Visits 26    Date for PT Re-Evaluation 05/22/24    PT Start Time 1600    PT Stop Time 1641    PT Time Calculation (min) 41 min    Activity Tolerance Patient tolerated treatment well           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   SIADH (syndrome of inappropriate ADH production) (HCC)    Sleep apnea    Stroke [facility])    Past Surgical History:  Procedure Laterality Date   ABLATION     APPENDECTOMY     CATARACT EXTRACTION, BILATERAL     COLONOSCOPY WITH PROPOFOL  N/A 03/17/2018   Procedure: COLONOSCOPY WITH PROPOFOL ;  Surgeon: Therisa Bi, MD;  Location: [facility] 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   Procedure: FECAL TRANSPLANT;  Surgeon: Therisa Bi, MD;  Location: [facility] ENDOSCOPY;  Service: Gastroenterology;  Laterality: N/A;   HEMORRHOID SURGERY     HERNIA REPAIR     HIP FRACTURE SURGERY     INTRAMEDULLARY (IM) NAIL INTERTROCHANTERIC Right 09/16/2018   Procedure: INTRAMEDULLARY (IM) NAIL INTERTROCHANTRIC;  Surgeon: Kathlynn Sharper, MD;  Location: [facility] ORS;  Service: Orthopedics;  Laterality: Right;   IR CATHETER TUBE CHANGE  11/22/2018   NASAL SINUS SURGERY     ORCHIECTOMY     TONSILLECTOMY     Patient Active Problem List   Diagnosis Date Noted   DVT femoral (deep venous thrombosis) with thrombophlebitis, left (HCC) 02/06/2020   Lymphedema 04/15/2019   Bilateral leg edema 01/25/2019    Hyponatremia 12/24/2018   SIADH (syndrome of inappropriate ADH production) (HCC) 12/24/2018   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) 10/27/2018   Generalized weakness 10/14/2018   Hip fracture (HCC) 09/15/2018   Moderate mitral insufficiency 08/16/2018   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   HLD (hyperlipidemia) 03/24/2018   Contusion of right knee 02/14/2018   Bradycardia 12/28/2017   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/03/2017   Tremor 09/04/2016   Chronic pain of right knee 08/10/2016   Right ankle pain 08/10/2016   Chronic venous insufficiency 05/13/2016   Non-Hodgkin's lymphoma (HCC) 12/11/2015   Urinary retention 09/08/2015   Lymphoma, non-Hodgkin's (HCC) 04/03/2015   Breathlessness on exertion 11/21/2014   Breath shortness 11/21/2014   Arthropathy 11/07/2014   Atrial flutter, paroxysmal (HCC) 11/07/2014   Type 2 diabetes mellitus (HCC) 11/07/2014   Benign essential tremor 11/07/2014   Benign essential HTN 11/07/2014   Mixed incontinence 11/07/2014   Hypercholesterolemia without hypertriglyceridemia 11/07/2014   Apnea, sleep 11/07/2014   Controlled type 2 diabetes mellitus without complication (HCC) 11/07/2014   Pure hypercholesterolemia 11/07/2014   Other abnormalities of gait and mobility 11/02/2011   Decreased mobility 11/02/2011   Abnormal gait 06/29/2011   Discoordination 06/29/2011   PCP: Jeffie,  Cheryl BRAVO, MD  REFERRING PROVIDER: Jeffie Cheryl BRAVO, MD  REFERRING DIAG: Gait instability  THERAPY DIAG:  Abnormal posture  Gait difficulty  Balance problem  Other lack of coordination  Muscle weakness (generalized)  Rationale for Evaluation and Treatment: Rehabilitation  ONSET DATE: Chronic  SUBJECTIVE:   SUBJECTIVE STATEMENT: Pt. Known well to PT clinic.  Pt. Had a fall in August and requires assist to return to standing.  Pts. Wife reports pt. Is  having increase difficulty with standing/walking.  Pt. Using rollator for household mobility and reports limited tasks out of house.  Pt. C/o 1/10 R knee pain and no current treatment plan.  Pt. Has chronic h/o R knee pain and has tried injections/ brace in past with no benefit.  Pt. Able to walk into church with rollator but requires use of w/c when walking into [place] [employer].  Pt. Able to ascend stairs at son's house with assist and step to pattern.    PERTINENT HISTORY: See MD notes/ recent SLP and OT notes.    PAIN:  Are you having pain? Yes: NPRS scale: 1/10 Pain location: R knee Pain description: sharp/ aching Aggravating factors: standing/walking Relieving factors: sitting  PRECAUTIONS: Fall  RED FLAGS: Bowel or bladder incontinence: Yes:     WEIGHT BEARING RESTRICTIONS: No  FALLS:  Has patient fallen in last 6 months? Yes. Number of falls 1  LIVING ENVIRONMENT: Lives with: lives with their spouse Lives in: House/apartment Stairs: No Has following equipment at home: Environmental consultant - 4 wheeled and Grab bars  OCCUPATION: Retired  PLOF: Requires assistive device for independence  PATIENT GOALS: Improve LE strength/ gait/ balance.  Decrease fall risk.    NEXT MD VISIT: PRN  OBJECTIVE:  Note: Objective measures were completed at Evaluation unless otherwise noted.  PATIENT SURVEYS:  FOTO initial 29/goal 31  COGNITION: Overall cognitive status: Impaired     SENSATION: Not tested  EDEMA:  NT  MUSCLE LENGTH: NT  POSTURE: rounded shoulders and forward head  PALPATION: R knee joint line tenderness  LOWER EXTREMITY ROM:   B UE/ LE AROM WFL.  Pt. Pain limited with R knee flexion/extension OP.   LOWER EXTREMITY MMT:  MMT Right eval Left eval  Hip flexion 4- 4-  Hip extension    Hip abduction    Hip adduction    Hip internal rotation    Hip external rotation    Knee flexion 5 5  Knee extension 4 4  Ankle dorsiflexion 4 4  Ankle plantarflexion     Ankle inversion    Ankle eversion     (Blank rows = not tested)  B shoulder: flexion 5/5, abduction 4/5, tricep 4+/5, bicep 5/5.    LOWER EXTREMITY SPECIAL TESTS:  NT  FUNCTIONAL TESTS:  5 times sit to stand: TBD 6 minute walk test: TBD Berg Balance Scale: 25/56 (significant fall risk)  GAIT: Distance walked: in clinic Assistive device utilized: Trai Ells - 4 wheeled Level of assistance: Modified independence and SBA Comments: Antalgic gait pattern with heavy UE assist/ use of brakes on rollator.  Increase c/o R knee pain and fearful of R knee buckling.  High fall risk.  TREATMENT DATE: 04/10/2024   Subjective:  Pt. Reports no new complaints.  Pt. Has MD f/u with Dr. Jeffie coming up and scheduled to see Dr. Edie to assess R knee.    Physical therapy treatment session today consisted of completing assessment of goals and administration of testing as demonstrated and documented in flow sheet, treatment, and goals section of this note. Addition treatments may be found below.   BLE MMT:   Hip flexion R/L: 3/3+  Knee ext R/L: 4/4+  Hip abd R/L: 5/5  Hip add R/L: 4+/4+  BERG: 19/56  There.ex.:   Nustep L5 8 min. B LE only.  Warm-up/ discussed weekend activities and upcoming MD f/u appts.    Sit to stands from gray chair with heavy B UE assist required    There.act.:  Standing wt. Shifting 10x with 1 UE assist (CGA for safety).  Pt. Unable to let go of //-bars without LOB and requires 1 UE assist to correct.    Walking in //-bars 5x with B UE assist with focus on increase step length/ heel strike.  Mirror feedback and cuing to relax B UT/ shoulder elevation  Resisted gait 2BTB 6x forward/backwards with UE assist required.  Cuing to correct upright posture and use mirror feedback.  Standing step touches at 6 curb with 1 UE assist and CGA for safety.  Heel and  toe taps.  Challenged with toe taps.      Walking in gym with rollator and recip. Gait pattern with consistent heel strike.  Good foot clearance while walking.  Using agility ladder for consistent step length/ heel strike.    Standing tolerance in //-bars with no UE assist (wt. Shifting)- verbal cuing for posture/ head correction.  1 LOB requiring UE assist on handrails to self-correct.    Amb. In hallway with focus on upright posture and lighter UE assist on rollator.  Pt. Has difficulty relaxing UE and tends to elevated B UT (moderate cuing for correction).    Amb. From gym to car (at side of building) with CGA for safety and cuing to correct posture/ step pattern.   Managing going down outside ramp/ navigating sidewalk to car with rollator.  Pt. Requires extra time and cuing to increase recip. Step pattern, esp. While descending ramp.  No LOB and cuing to prevent stopping and focus on consistent gait.   PATIENT EDUCATION:  Education details: HEP Person educated: Patient and Spouse Education method: Medical illustrator Education comprehension: verbalized understanding and returned demonstration  HOME EXERCISE PROGRAM: See handouts  ASSESSMENT:  CLINICAL IMPRESSION:   Patient's condition has the potential to improve in response to therapy. Maximum improvement is yet to be obtained. The anticipated improvement is attainable and reasonable in a generally predictable time. Patient with 6 point decrease in score on BERG 19/56 with difficulty with any tasks without UE support. Has improved his ability to ambulate with rollator into his [address] with 1 person assist. Pt. Will benefit from skilled PT services to increase standing tolerance/ walking household distances with improved balance/ decrease fall risk.    OBJECTIVE IMPAIRMENTS: Abnormal gait, decreased activity tolerance, decreased balance, decreased endurance, decreased mobility, difficulty walking, decreased  strength, hypomobility, improper body mechanics, postural dysfunction, and pain.   ACTIVITY LIMITATIONS: standing, stairs, transfers, and locomotion level  PARTICIPATION LIMITATIONS: community activity and church  PERSONAL FACTORS: Fitness and Past/current experiences are also affecting patient's functional outcome.   REHAB POTENTIAL: Good  CLINICAL DECISION MAKING: Evolving/moderate complexity  EVALUATION COMPLEXITY:  Moderate   GOALS: Goals reviewed with patient? Yes  LONG TERM GOALS: Target date: 05/22/24  Pt. Will increase B hip/LE muscle strength 1/2 muscle grade to improve transfers/ standing tolerance/   Baseline:  see above; 7/15: see above  Goal status: Partially met  2.  Pt. Standing from standard height chair in rollator with 1 attempts and use of 1 UE assist safely. Baseline:  Extra time/ B UE assist.  Prefers pulling up; 7/15: requires BUE support to stand  Goal status: Partially met  3.  Pt. Will increase Berg balance test score to >35/56 to decrease fall risk/ improve balance with walking.   Baseline: initial 25 (high fall risk); 7/15: 19/56 Goal status: On-going  4.  Pt. Able to ambulate from car into [facility] [employer] with use of rollator and SBA/mod. I safely to improve functional mobility.   Baseline: pt. Requires w/c with increase walking distances.;  7/15: Requires 1 person assist to ambulate with rollator into meeting Goal status: Ongoing  PLAN:  PT FREQUENCY: 1x/week  PT DURATION: 6 weeks  PLANNED INTERVENTIONS: 97110-Therapeutic exercises, 97530- Therapeutic activity, 97112- Neuromuscular re-education, 97535- Self Care, 02859- Manual therapy, [phone]- Gait training, Patient/Family education, Balance training, Stair training, Joint mobilization, Cryotherapy, and Moist heat  PLAN FOR NEXT SESSION:  Increase standing tolerance.     Maryanne Finder, PT, DPT Physical Therapist - [facility] 04/10/2024, 3:56 PM

## 2024-04-17 ENCOUNTER — Ambulatory Visit: Admitting: Physical Therapy

## 2024-04-17 DIAGNOSIS — R278 Other lack of coordination: Secondary | ICD-10-CM

## 2024-04-17 DIAGNOSIS — G250 Essential tremor: Secondary | ICD-10-CM

## 2024-04-17 DIAGNOSIS — R269 Unspecified abnormalities of gait and mobility: Secondary | ICD-10-CM

## 2024-04-17 DIAGNOSIS — R293 Abnormal posture: Secondary | ICD-10-CM

## 2024-04-17 DIAGNOSIS — R2689 Other abnormalities of gait and mobility: Secondary | ICD-10-CM

## 2024-04-17 DIAGNOSIS — M6281 Muscle weakness (generalized): Secondary | ICD-10-CM

## 2024-04-17 NOTE — Therapy (Unsigned)
 OUTPATIENT PHYSICAL THERAPY LOWER EXTREMITY TREATMENT   Patient Name: Mike Holloway MRN: [id number] DOB:[birth year], 83 y.o., male Today's Date: 04/17/2024   END OF SESSION:  PT End of Session - 04/17/24 1444     Visit Number 26    Number of Visits 40    Date for PT Re-Evaluation 05/22/24    PT Stop Time 1518    Activity Tolerance Patient tolerated treatment well                Past Medical History:  Diagnosis Date   Arthritis     Atrial flutter (HCC)     Diabetes mellitus (HCC)     Essential tremor     Essential tremor      deep brain stimulator    Hypercholesteremia     Hypertension     Incontinence     Non-Hodgkin lymphoma (HCC)      grew on the testical   SIADH (syndrome of inappropriate ADH production) (HCC)     Sleep apnea     Stroke [facility])               Past Surgical History:  Procedure Laterality Date   ABLATION       APPENDECTOMY       CATARACT EXTRACTION, BILATERAL       COLONOSCOPY WITH PROPOFOL  N/A 03/17/2018    Procedure: COLONOSCOPY WITH PROPOFOL ;  Surgeon: Therisa Bi, MD;  Location: [facility] ENDOSCOPY;  Service: Gastroenterology;  Laterality: N/A;   DEEP BRAIN STIMULATOR PLACEMENT       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    Procedure: FECAL TRANSPLANT;  Surgeon: Therisa Bi, MD;  Location: [facility] ENDOSCOPY;  Service: Gastroenterology;  Laterality: N/A;   HEMORRHOID SURGERY       HERNIA REPAIR       HIP FRACTURE SURGERY       INTRAMEDULLARY (IM) NAIL INTERTROCHANTERIC Right 09/16/2018    Procedure: INTRAMEDULLARY (IM) NAIL INTERTROCHANTRIC;  Surgeon: Kathlynn Sharper, MD;  Location: [facility] ORS;  Service: Orthopedics;  Laterality: Right;   IR CATHETER TUBE CHANGE   11/22/2018   NASAL SINUS SURGERY       ORCHIECTOMY       TONSILLECTOMY                Patient Active Problem List    Diagnosis Date Noted   DVT femoral (deep venous thrombosis) with thrombophlebitis, left (HCC) 02/06/2020   Lymphedema 04/15/2019   Bilateral leg edema 01/25/2019    Hyponatremia 12/24/2018   SIADH (syndrome of inappropriate ADH production) (HCC) 12/24/2018   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) 10/27/2018   Generalized weakness 10/14/2018   Hip fracture (HCC) 09/15/2018   Moderate mitral insufficiency 08/16/2018   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   HLD (hyperlipidemia) 03/24/2018   Contusion of right knee 02/14/2018   Bradycardia 12/28/2017   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/03/2017   Tremor 09/04/2016   Chronic pain of right knee 08/10/2016   Right ankle pain 08/10/2016   Chronic venous insufficiency 05/13/2016   Non-Hodgkin's lymphoma (HCC) 12/11/2015   Urinary retention 09/08/2015   Lymphoma, non-Hodgkin's (HCC) 04/03/2015   Breathlessness on exertion 11/21/2014   Breath shortness 11/21/2014   Arthropathy 11/07/2014   Atrial flutter, paroxysmal (HCC) 11/07/2014   Type 2 diabetes mellitus (HCC) 11/07/2014   Benign essential tremor 11/07/2014   Benign essential HTN 11/07/2014   Mixed incontinence 11/07/2014   Hypercholesterolemia without hypertriglyceridemia 11/07/2014  Apnea, sleep 11/07/2014   Controlled type 2 diabetes mellitus without complication (HCC) 11/07/2014   Pure hypercholesterolemia 11/07/2014   Other abnormalities of gait and mobility 11/02/2011   Decreased mobility 11/02/2011   Abnormal gait 06/29/2011   Discoordination 06/29/2011    PCP: Jeffie Cheryl BRAVO, MD   REFERRING PROVIDER: Jeffie Cheryl BRAVO, MD   REFERRING DIAG: Gait instability   THERAPY DIAG:  Abnormal posture   Gait difficulty   Balance problem   Other lack of coordination   Muscle weakness (generalized)   Rationale for Evaluation and Treatment: Rehabilitation   ONSET DATE: Chronic   SUBJECTIVE:    SUBJECTIVE STATEMENT: Pt. Known well to PT clinic.  Pt. Had a fall in August and requires assist to return to standing.  Pts. Wife reports  pt. Is having increase difficulty with standing/walking.  Pt. Using rollator for household mobility and reports limited tasks out of house.  Pt. C/o 1/10 R knee pain and no current treatment plan.  Pt. Has chronic h/o R knee pain and has tried injections/ brace in past with no benefit.  Pt. Able to walk into church with rollator but requires use of w/c when walking into [place] [employer].  Pt. Able to ascend stairs at son's house with assist and step to pattern.     PERTINENT HISTORY: See MD notes/ recent SLP and OT notes.     PAIN:  Are you having pain? Yes: NPRS scale: 1/10 Pain location: R knee Pain description: sharp/ aching Aggravating factors: standing/walking Relieving factors: sitting   PRECAUTIONS: Fall   RED FLAGS: Bowel or bladder incontinence: Yes:           WEIGHT BEARING RESTRICTIONS: No   FALLS:  Has patient fallen in last 6 months? Yes. Number of falls 1   LIVING ENVIRONMENT: Lives with: lives with their spouse Lives in: House/apartment Stairs: No Has following equipment at home: Environmental consultant - 4 wheeled and Grab bars   OCCUPATION: Retired   PLOF: Requires assistive device for independence   PATIENT GOALS: Improve LE strength/ gait/ balance.  Decrease fall risk.     NEXT MD VISIT: PRN   OBJECTIVE:  Note: Objective measures were completed at Evaluation unless otherwise noted.   PATIENT SURVEYS:  FOTO initial 29/goal 60   COGNITION: Overall cognitive status: Impaired                                  SENSATION: Not tested   EDEMA:  NT   MUSCLE LENGTH: NT   POSTURE: rounded shoulders and forward head   PALPATION: R knee joint line tenderness   LOWER EXTREMITY ROM:              B UE/ LE AROM WFL.  Pt. Pain limited with R knee flexion/extension OP.    LOWER EXTREMITY MMT:   MMT Right eval Left eval  Hip flexion 4- 4-  Hip extension      Hip abduction      Hip adduction      Hip internal rotation      Hip external rotation       Knee flexion 5 5  Knee extension 4 4  Ankle dorsiflexion 4 4  Ankle plantarflexion      Ankle inversion      Ankle eversion       (Blank rows = not tested)   B shoulder: flexion 5/5, abduction 4/5, tricep  4+/5, bicep 5/5.     LOWER EXTREMITY SPECIAL TESTS:  NT   FUNCTIONAL TESTS:  5 times sit to stand: TBD 6 minute walk test: TBD Berg Balance Scale: 25/56 (significant fall risk)   GAIT: Distance walked: in clinic Assistive device utilized: Walker - 4 wheeled Level of assistance: Modified independence and SBA Comments: Antalgic gait pattern with heavy UE assist/ use of brakes on rollator.  Increase c/o R knee pain and fearful of R knee buckling.  High fall risk.  BLE MMT:            Hip flexion R/L: 3/3+           Knee ext R/L: 4/4+           Hip abd R/L: 5/5           Hip add R/L: 4+/4+   BERG: 19/56                                                                                                               TREATMENT DATE: 04/17/2024   Subjective:  Pt. Reports no increased LBP today.   There.ex.:  Nustep L5 10 min. B LE only, moist hot pack on lower back.  Warm-up/ discussed weekend activities and upcoming MD f/u appts.    Theract: 5x Sit to stands from 23.5 in mat tabel w/o UE support, SBA 3x Sit to stands from 22.5 in mat tabel w/o UE support, SBA  5x B large amplitude forward stepping in // bars with BUE support , CGA   PT walked with pt to his car over non-level surface, SBA. Pt did not experience any LOB, though he walked at a slowed pace and with increased caution when walking on a decline.     PATIENT EDUCATION:  Education details: HEP Person educated: Patient and Spouse Education method: Medical illustrator Education comprehension: verbalized understanding and returned demonstration   HOME EXERCISE PROGRAM: See handouts   ASSESSMENT:   CLINICAL IMPRESSION:   Patient's condition has the potential to improve in response to therapy. Maximum  improvement is yet to be obtained. The anticipated improvement is attainable and reasonable in a generally predictable time. Pt demonstrated difficulty performing STS without UE support, even from an elevated mat table. Pt was fearful of leaning forward to stand, appeared to have difficulty extending B hips and knees in a closed-chain position. Pt required BUE support to perform large amplitude forward stepping, though he was able to take 1UE off the // bars to reach out. He required BUE support to step backwards. He was also limited by pain in his R knee for forward stepping on the R side. Pt. Will benefit from skilled PT services to increase standing tolerance/ walking household distances with improved balance/ decrease fall risk.     OBJECTIVE IMPAIRMENTS: Abnormal gait, decreased activity tolerance, decreased balance, decreased endurance, decreased mobility, difficulty walking, decreased strength, hypomobility, improper body mechanics, postural dysfunction, and pain.    ACTIVITY LIMITATIONS: standing, stairs, transfers, and locomotion level   PARTICIPATION  LIMITATIONS: community activity and church   PERSONAL FACTORS: Fitness and Past/current experiences are also affecting patient's functional outcome.    REHAB POTENTIAL: Good   CLINICAL DECISION MAKING: Evolving/moderate complexity   EVALUATION COMPLEXITY: Moderate     GOALS: Goals reviewed with patient? Yes   LONG TERM GOALS: Target date: 05/22/24   Pt. Will increase B hip/LE muscle strength 1/2 muscle grade to improve transfers/ standing tolerance/   Baseline:  see above; 7/15: see above  Goal status: Partially met   2.  Pt. Standing from standard height chair in rollator with 1 attempts and use of 1 UE assist safely. Baseline:  Extra time/ B UE assist.  Prefers pulling up; 7/15: requires BUE support to stand  Goal status: Partially met   3.  Pt. Will increase Berg balance test score to >35/56 to decrease fall risk/ improve  balance with walking.   Baseline: initial 25 (high fall risk); 7/15: 19/56 Goal status: On-going   4.  Pt. Able to ambulate from car into [facility] [employer] with use of rollator and SBA/mod. I safely to improve functional mobility.   Baseline: pt. Requires w/c with increase walking distances.;  7/15: Requires 1 person assist to ambulate with rollator into meeting Goal status: Ongoing   PLAN:   PT FREQUENCY: 1x/week   PT DURATION: 6 weeks   PLANNED INTERVENTIONS: 97110-Therapeutic exercises, 97530- Therapeutic activity, 97112- Neuromuscular re-education, 97535- Self Care, 02859- Manual therapy, [phone]- Gait training, Patient/Family education, Balance training, Stair training, Joint mobilization, Cryotherapy, and Moist heat   PLAN FOR NEXT SESSION:  Increase standing tolerance.      Maryanne Finder, PT, DPT Physical Therapist - [facility] 04/10/2024, 3:56 PM

## 2024-04-23 ENCOUNTER — Ambulatory Visit (INDEPENDENT_AMBULATORY_CARE_PROVIDER_SITE_OTHER): Admitting: Physician Assistant

## 2024-04-23 DIAGNOSIS — Z466 Encounter for fitting and adjustment of urinary device: Secondary | ICD-10-CM

## 2024-04-23 NOTE — Progress Notes (Signed)
 Cath Change/ Replacement  Patient is present today for a catheter change due to urinary retention.  8ml of water was removed from the balloon, a 16FR coude foley cath was removed without difficulty.  Patient was cleaned and prepped in a sterile fashion with betadine and 2% lidocaine  jelly was instilled into the urethra. A 16 FR coude foley cath was replaced into the bladder, no complications were noted. Urine return was noted <60ml and urine was yellow in color. The balloon was filled with 10ml of sterile water. A leg bag was attached for drainage.  Patient tolerated well.    Performed by: Mohannad Olivero, PA-C   Follow up: Return in about 4 weeks (around 05/21/2024) for Catheter exchange.

## 2024-04-24 ENCOUNTER — Encounter: Payer: Self-pay | Admitting: Physical Therapy

## 2024-04-24 ENCOUNTER — Ambulatory Visit: Admitting: Physical Therapy

## 2024-04-24 DIAGNOSIS — R278 Other lack of coordination: Secondary | ICD-10-CM

## 2024-04-24 DIAGNOSIS — R293 Abnormal posture: Secondary | ICD-10-CM

## 2024-04-24 DIAGNOSIS — M6281 Muscle weakness (generalized): Secondary | ICD-10-CM

## 2024-04-24 DIAGNOSIS — G250 Essential tremor: Secondary | ICD-10-CM

## 2024-04-24 DIAGNOSIS — R2689 Other abnormalities of gait and mobility: Secondary | ICD-10-CM

## 2024-04-24 NOTE — Therapy (Signed)
 OUTPATIENT PHYSICAL THERAPY LOWER EXTREMITY TREATMENT   Patient Name: Mike Holloway MRN: [id number] DOB:Feb 06, 1941, 83 y.o., male Today's Date: 04/24/2024   END OF SESSION:  PT End of Session - 04/24/24 1437     Visit Number 27    Number of Visits 40    Date for PT Re-Evaluation 05/22/24    PT Start Time 1431    PT Stop Time 1535    PT Time Calculation (min) 64 min                Past Medical History:  Diagnosis Date   Arthritis     Atrial flutter (HCC)     Diabetes mellitus (HCC)     Essential tremor     Essential tremor      deep brain stimulator    Hypercholesteremia     Hypertension     Incontinence     Non-Hodgkin lymphoma (HCC)      grew on the testical   SIADH (syndrome of inappropriate ADH production) (HCC)     Sleep apnea     Stroke [facility])               Past Surgical History:  Procedure Laterality Date   ABLATION       APPENDECTOMY       CATARACT EXTRACTION, BILATERAL       COLONOSCOPY WITH PROPOFOL  N/A 03/17/2018    Procedure: COLONOSCOPY WITH PROPOFOL ;  Surgeon: Therisa Bi, MD;  Location: [facility] ENDOSCOPY;  Service: Gastroenterology;  Laterality: N/A;   DEEP BRAIN STIMULATOR PLACEMENT       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    Procedure: FECAL TRANSPLANT;  Surgeon: Therisa Bi, MD;  Location: [facility] ENDOSCOPY;  Service: Gastroenterology;  Laterality: N/A;   HEMORRHOID SURGERY       HERNIA REPAIR       HIP FRACTURE SURGERY       INTRAMEDULLARY (IM) NAIL INTERTROCHANTERIC Right 09/16/2018    Procedure: INTRAMEDULLARY (IM) NAIL INTERTROCHANTRIC;  Surgeon: Kathlynn Sharper, MD;  Location: [facility] ORS;  Service: Orthopedics;  Laterality: Right;   IR CATHETER TUBE CHANGE   11/22/2018   NASAL SINUS SURGERY       ORCHIECTOMY       TONSILLECTOMY                Patient Active Problem List    Diagnosis Date Noted   DVT femoral (deep venous thrombosis) with thrombophlebitis, left (HCC) 02/06/2020   Lymphedema 04/15/2019   Bilateral leg edema 01/25/2019    Hyponatremia 12/24/2018   SIADH (syndrome of inappropriate ADH production) (HCC) 12/24/2018   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) 10/27/2018   Generalized weakness 10/14/2018   Hip fracture (HCC) 09/15/2018   Moderate mitral insufficiency 08/16/2018   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   HLD (hyperlipidemia) 03/24/2018   Contusion of right knee 02/14/2018   Bradycardia 12/28/2017   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   Tremor 09/04/2016   Chronic pain of right knee 08/10/2016   Right ankle pain 08/10/2016   Chronic venous insufficiency 05/13/2016   Non-Hodgkin's lymphoma (HCC) 12/11/2015   Urinary retention 09/08/2015   Lymphoma, non-Hodgkin's (HCC) 04/03/2015   Breathlessness on exertion 11/21/2014   Breath shortness 11/21/2014   Arthropathy 11/07/2014   Atrial flutter, paroxysmal (HCC) 11/07/2014   Type 2 diabetes mellitus (HCC) 11/07/2014   Benign essential tremor 11/07/2014   Benign essential HTN 11/07/2014   Mixed incontinence 11/07/2014  Hypercholesterolemia without hypertriglyceridemia 11/07/2014   Apnea, sleep 11/07/2014   Controlled type 2 diabetes mellitus without complication (HCC) 11/07/2014   Pure hypercholesterolemia 11/07/2014   Other abnormalities of gait and mobility 11/02/2011   Decreased mobility 11/02/2011   Abnormal gait 06/29/2011   Discoordination 06/29/2011    PCP: Jeffie Cheryl BRAVO, MD   REFERRING PROVIDER: Jeffie Cheryl BRAVO, MD   REFERRING DIAG: Gait instability   THERAPY DIAG:  Abnormal posture   Gait difficulty   Balance problem   Other lack of coordination   Muscle weakness (generalized)   Rationale for Evaluation and Treatment: Rehabilitation   ONSET DATE: Chronic   SUBJECTIVE:    SUBJECTIVE STATEMENT: Pt. Known well to PT clinic.  Pt. Had a fall in August and requires assist to return to standing.  Pts. Wife reports  pt. Is having increase difficulty with standing/walking.  Pt. Using rollator for household mobility and reports limited tasks out of house.  Pt. C/o 1/10 R knee pain and no current treatment plan.  Pt. Has chronic h/o R knee pain and has tried injections/ brace in past with no benefit.  Pt. Able to walk into church with rollator but requires use of w/c when walking into [place] [employer].  Pt. Able to ascend stairs at son's house with assist and step to pattern.     PERTINENT HISTORY: See MD notes/ recent SLP and OT notes.     PAIN:  Are you having pain? Yes: NPRS scale: 1/10 Pain location: R knee Pain description: sharp/ aching Aggravating factors: standing/walking Relieving factors: sitting   PRECAUTIONS: Fall   RED FLAGS: Bowel or bladder incontinence: Yes:           WEIGHT BEARING RESTRICTIONS: No   FALLS:  Has patient fallen in last 6 months? Yes. Number of falls 1   LIVING ENVIRONMENT: Lives with: lives with their spouse Lives in: House/apartment Stairs: No Has following equipment at home: Environmental consultant - 4 wheeled and Grab bars   OCCUPATION: Retired   PLOF: Requires assistive device for independence   PATIENT GOALS: Improve LE strength/ gait/ balance.  Decrease fall risk.     NEXT MD VISIT: PRN   OBJECTIVE:  Note: Objective measures were completed at Evaluation unless otherwise noted.   PATIENT SURVEYS:  FOTO initial 29/goal 14   COGNITION: Overall cognitive status: Impaired                                  SENSATION: Not tested   EDEMA:  NT   MUSCLE LENGTH: NT   POSTURE: rounded shoulders and forward head   PALPATION: R knee joint line tenderness   LOWER EXTREMITY ROM:              B UE/ LE AROM WFL.  Pt. Pain limited with R knee flexion/extension OP.    LOWER EXTREMITY MMT:   MMT Right eval Left eval  Hip flexion 4- 4-  Hip extension      Hip abduction      Hip adduction      Hip internal rotation      Hip external rotation       Knee flexion 5 5  Knee extension 4 4  Ankle dorsiflexion 4 4  Ankle plantarflexion      Ankle inversion      Ankle eversion       (Blank rows = not tested)   B  shoulder: flexion 5/5, abduction 4/5, tricep 4+/5, bicep 5/5.     LOWER EXTREMITY SPECIAL TESTS:  NT   FUNCTIONAL TESTS:  5 times sit to stand: TBD 6 minute walk test: TBD Berg Balance Scale: 25/56 (significant fall risk)   GAIT: Distance walked: in clinic Assistive device utilized: Walker - 4 wheeled Level of assistance: Modified independence and SBA Comments: Antalgic gait pattern with heavy UE assist/ use of brakes on rollator.  Increase c/o R knee pain and fearful of R knee buckling.  High fall risk.  BLE MMT:            Hip flexion R/L: 3/3+           Knee ext R/L: 4/4+           Hip abd R/L: 5/5           Hip add R/L: 4+/4+   BERG: 19/56                                                                                                               TREATMENT DATE: 04/24/2024   Subjective:  Pt. Reports no new issues.  Pts. Wife reports pt. Was able to walk outside to watch installation of hot water heater.     There.ex.:  No charge Nustep L5 10 min. B LE only, moist hot pack on lower back.  Warm-up/ discussed walking at home.   Theract:  Walking in clinic in with rollator and focus on lighter grasp on rollator to decrease B UT elevation/ shoulder pain  Ascending/ descending stairs with step to gait 2x.  Heavy B UT assist on handrails.  Mod. Independence with stairs today/ good heel clearance while descending.     3x Sit to stands from 23.5 in mat tabel w/o UE support, SBA 3x Sit to stands from 22.5 in mat tabel w/o UE support, SBA  (extra time to complete/ increase R knee pain).    Standing wt. Shifting/ tolerance in front of blue mat table with no UE assist.  SBA for safety and 1 episode of pt. Having to reaching for rollator to correct sway/ LOB.    PT walked with pt to his car over non-level surface,  SBA. Pt did not experience any LOB, though he walked at a slowed pace and with increased caution when walking on a decline.     PATIENT EDUCATION:  Education details: HEP Person educated: Patient and Spouse Education method: Medical illustrator Education comprehension: verbalized understanding and returned demonstration   HOME EXERCISE PROGRAM: See handouts   ASSESSMENT:   CLINICAL IMPRESSION:   Pt demonstrated difficulty performing STS without UE support, even from an elevated mat table.  Extra time to complete STS from 22 inches and limited by R knee pain.  Pt. Wants to have a MD appt. With Dr. Edie to discuss all options to manage knee pain.   Pt was fearful of leaning forward to stand, appeared to have difficulty extending B hips and knees in a closed-chain position. Pt. Unable  to relax B UT/shoulder while using rollator in hallway.  Pt. remains limited by pain in his R knee for forward stepping during gait. Pt. Will benefit from skilled PT services to increase standing tolerance/ walking household distances with improved balance/ decrease fall risk.     OBJECTIVE IMPAIRMENTS: Abnormal gait, decreased activity tolerance, decreased balance, decreased endurance, decreased mobility, difficulty walking, decreased strength, hypomobility, improper body mechanics, postural dysfunction, and pain.    ACTIVITY LIMITATIONS: standing, stairs, transfers, and locomotion level   PARTICIPATION LIMITATIONS: community activity and church   PERSONAL FACTORS: Fitness and Past/current experiences are also affecting patient's functional outcome.    REHAB POTENTIAL: Good   CLINICAL DECISION MAKING: Evolving/moderate complexity   EVALUATION COMPLEXITY: Moderate     GOALS: Goals reviewed with patient? Yes   LONG TERM GOALS: Target date: 05/22/24   Pt. Will increase B hip/LE muscle strength 1/2 muscle grade to improve transfers/ standing tolerance/   Baseline:  see above; 7/15: see above   Goal status: Partially met   2.  Pt. Standing from standard height chair in rollator with 1 attempts and use of 1 UE assist safely. Baseline:  Extra time/ B UE assist.  Prefers pulling up; 7/15: requires BUE support to stand  Goal status: Partially met   3.  Pt. Will increase Berg balance test score to >35/56 to decrease fall risk/ improve balance with walking.   Baseline: initial 25 (high fall risk); 7/15: 19/56 Goal status: On-going   4.  Pt. Able to ambulate from car into [facility] [employer] with use of rollator and SBA/mod. I safely to improve functional mobility.   Baseline: pt. Requires w/c with increase walking distances.;  7/15: Requires 1 person assist to ambulate with rollator into meeting Goal status: Ongoing   PLAN:   PT FREQUENCY: 1x/week   PT DURATION: 6 weeks   PLANNED INTERVENTIONS: 97110-Therapeutic exercises, 97530- Therapeutic activity, 97112- Neuromuscular re-education, 97535- Self Care, 02859- Manual therapy, [phone]- Gait training, Patient/Family education, Balance training, Stair training, Joint mobilization, Cryotherapy, and Moist heat   PLAN FOR NEXT SESSION:  Increase standing tolerance.   Discuss referral to Dr. Edie after MD f/u with Dr. Jeffie.   Ozell JAYSON Sero, PT, DPT # [phone] Physical Therapist - [facility] 04/17/2024, 3:51 PM

## 2024-05-01 ENCOUNTER — Encounter: Payer: Self-pay | Admitting: Physical Therapy

## 2024-05-01 ENCOUNTER — Ambulatory Visit: Attending: Family Medicine | Admitting: Physical Therapy

## 2024-05-01 DIAGNOSIS — R293 Abnormal posture: Secondary | ICD-10-CM | POA: Insufficient documentation

## 2024-05-01 DIAGNOSIS — R269 Unspecified abnormalities of gait and mobility: Secondary | ICD-10-CM | POA: Insufficient documentation

## 2024-05-01 DIAGNOSIS — Z466 Encounter for fitting and adjustment of urinary device: Secondary | ICD-10-CM | POA: Insufficient documentation

## 2024-05-01 DIAGNOSIS — M6281 Muscle weakness (generalized): Secondary | ICD-10-CM | POA: Insufficient documentation

## 2024-05-01 DIAGNOSIS — R2689 Other abnormalities of gait and mobility: Secondary | ICD-10-CM | POA: Insufficient documentation

## 2024-05-01 DIAGNOSIS — R278 Other lack of coordination: Secondary | ICD-10-CM | POA: Insufficient documentation

## 2024-05-01 NOTE — Therapy (Signed)
 OUTPATIENT PHYSICAL THERAPY LOWER EXTREMITY TREATMENT   Patient Name: Mike Holloway MRN: [id number] DOB:Oct 29, 1940, 83 y.o., male Today's Date: 05/01/2024   END OF SESSION:  PT End of Session - 05/01/24 1311     Visit Number 28    Number of Visits 40    Date for PT Re-Evaluation 05/22/24    PT Start Time 1302    PT Stop Time 1350    PT Time Calculation (min) 48 min                Past Medical History:  Diagnosis Date   Arthritis     Atrial flutter (HCC)     Diabetes mellitus (HCC)     Essential tremor     Essential tremor      deep brain stimulator    Hypercholesteremia     Hypertension     Incontinence     Non-Hodgkin lymphoma (HCC)      grew on the testical   SIADH (syndrome of inappropriate ADH production) (HCC)     Sleep apnea     Stroke [facility])               Past Surgical History:  Procedure Laterality Date   ABLATION       APPENDECTOMY       CATARACT EXTRACTION, BILATERAL       COLONOSCOPY WITH PROPOFOL  N/A 03/17/2018    Procedure: COLONOSCOPY WITH PROPOFOL ;  Surgeon: Therisa Bi, MD;  Location: [facility] ENDOSCOPY;  Service: Gastroenterology;  Laterality: N/A;   DEEP BRAIN STIMULATOR PLACEMENT       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    Procedure: FECAL TRANSPLANT;  Surgeon: Therisa Bi, MD;  Location: [facility] ENDOSCOPY;  Service: Gastroenterology;  Laterality: N/A;   HEMORRHOID SURGERY       HERNIA REPAIR       HIP FRACTURE SURGERY       INTRAMEDULLARY (IM) NAIL INTERTROCHANTERIC Right 09/16/2018    Procedure: INTRAMEDULLARY (IM) NAIL INTERTROCHANTRIC;  Surgeon: Kathlynn Sharper, MD;  Location: [facility] ORS;  Service: Orthopedics;  Laterality: Right;   IR CATHETER TUBE CHANGE   11/22/2018   NASAL SINUS SURGERY       ORCHIECTOMY       TONSILLECTOMY                Patient Active Problem List    Diagnosis Date Noted   DVT femoral (deep venous thrombosis) with thrombophlebitis, left (HCC) 02/06/2020   Lymphedema 04/15/2019   Bilateral leg edema 01/25/2019    Hyponatremia 12/24/2018   SIADH (syndrome of inappropriate ADH production) (HCC) 12/24/2018   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) 10/27/2018   Generalized weakness 10/14/2018   Hip fracture (HCC) 09/15/2018   Moderate mitral insufficiency 08/16/2018   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   HLD (hyperlipidemia) 03/24/2018   Contusion of right knee 02/14/2018   Bradycardia 12/28/2017   Status post right unicompartmental knee replacement 11/03/2017   Tremor 09/04/2016   Chronic pain of right knee 08/10/2016   Right ankle pain 08/10/2016   Chronic venous insufficiency 05/13/2016   Non-Hodgkin's lymphoma (HCC) 12/11/2015   Urinary retention 09/08/2015   Lymphoma, non-Hodgkin's (HCC) 04/03/2015   Breathlessness on exertion 11/21/2014   Breath shortness 11/21/2014   Arthropathy 11/07/2014   Atrial flutter, paroxysmal (HCC) 11/07/2014   Type 2 diabetes mellitus (HCC) 11/07/2014   Benign essential tremor 11/07/2014   Benign essential HTN 11/07/2014   Mixed incontinence 11/07/2014  Hypercholesterolemia without hypertriglyceridemia 11/07/2014   Apnea, sleep 11/07/2014   Controlled type 2 diabetes mellitus without complication (HCC) 11/07/2014   Pure hypercholesterolemia 11/07/2014   Other abnormalities of gait and mobility 11/02/2011   Decreased mobility 11/02/2011   Abnormal gait 06/29/2011   Discoordination 06/29/2011    PCP: Jeffie Cheryl BRAVO, MD   REFERRING PROVIDER: Jeffie Cheryl BRAVO, MD   REFERRING DIAG: Gait instability   THERAPY DIAG:  Abnormal posture   Gait difficulty   Balance problem   Other lack of coordination   Muscle weakness (generalized)   Rationale for Evaluation and Treatment: Rehabilitation   ONSET DATE: Chronic   SUBJECTIVE:    SUBJECTIVE STATEMENT: Pt. Known well to PT clinic.  Pt. Had a fall in August and requires assist to return to standing.  Pts. Wife reports  pt. Is having increase difficulty with standing/walking.  Pt. Using rollator for household mobility and reports limited tasks out of house.  Pt. C/o 1/10 R knee pain and no current treatment plan.  Pt. Has chronic h/o R knee pain and has tried injections/ brace in past with no benefit.  Pt. Able to walk into church with rollator but requires use of w/c when walking into [place] [employer].  Pt. Able to ascend stairs at son's house with assist and step to pattern.     PERTINENT HISTORY: See MD notes/ recent SLP and OT notes.     PAIN:  Are you having pain? Yes: NPRS scale: 1/10 Pain location: R knee Pain description: sharp/ aching Aggravating factors: standing/walking Relieving factors: sitting   PRECAUTIONS: Fall   RED FLAGS: Bowel or bladder incontinence: Yes:           WEIGHT BEARING RESTRICTIONS: No   FALLS:  Has patient fallen in last 6 months? Yes. Number of falls 1   LIVING ENVIRONMENT: Lives with: lives with their spouse Lives in: House/apartment Stairs: No Has following equipment at home: Environmental consultant - 4 wheeled and Grab bars   OCCUPATION: Retired   PLOF: Requires assistive device for independence   PATIENT GOALS: Improve LE strength/ gait/ balance.  Decrease fall risk.     NEXT MD VISIT: PRN   OBJECTIVE:  Note: Objective measures were completed at Evaluation unless otherwise noted.   PATIENT SURVEYS:  FOTO initial 29/goal 50   COGNITION: Overall cognitive status: Impaired                                  SENSATION: Not tested   EDEMA:  NT   MUSCLE LENGTH: NT   POSTURE: rounded shoulders and forward head   PALPATION: R knee joint line tenderness   LOWER EXTREMITY ROM:              B UE/ LE AROM WFL.  Pt. Pain limited with R knee flexion/extension OP.    LOWER EXTREMITY MMT:   MMT Right eval Left eval  Hip flexion 4- 4-  Hip extension      Hip abduction      Hip adduction      Hip internal rotation      Hip external rotation       Knee flexion 5 5  Knee extension 4 4  Ankle dorsiflexion 4 4  Ankle plantarflexion      Ankle inversion      Ankle eversion       (Blank rows = not tested)   B  shoulder: flexion 5/5, abduction 4/5, tricep 4+/5, bicep 5/5.     LOWER EXTREMITY SPECIAL TESTS:  NT   FUNCTIONAL TESTS:  5 times sit to stand: TBD 6 minute walk test: TBD Berg Balance Scale: 25/56 (significant fall risk)   GAIT: Distance walked: in clinic Assistive device utilized: Walker - 4 wheeled Level of assistance: Modified independence and SBA Comments: Antalgic gait pattern with heavy UE assist/ use of brakes on rollator.  Increase c/o R knee pain and fearful of R knee buckling.  High fall risk.  BLE MMT:            Hip flexion R/L: 3/3+           Knee ext R/L: 4/4+           Hip abd R/L: 5/5           Hip add R/L: 4+/4+   BERG: 19/56                                                                                                               TREATMENT DATE: 05/01/2024   Subjective:  Pt. Reports 3-4/10 R knee pain with walking into clinic today.  No c/o back pain reported.      There.ex.:  No charge Nustep L5-3 10 min. B LE only, moist hot pack on lower back.  Warm-up/ discussed walking at home.   Increase R knee pain reported.    Theract:  Walking in clinic in with rollator and focus on lighter grasp on rollator to decrease B UT elevation/ shoulder pain  High marching in //-bars 3 laps/ lateral walking in //-bars 2 laps.  Heavy UE assist secondary to R knee pain.    Standing wt. Shifting at //-bars (mirror feedback)- added functional reaching with cones and UE assist on //-bars (SBA from PT for safety).  Added B shoulder flexion.    Standing 6 recip. Step touches in front of //-bars.    PT walked with pt to his car over non-level surface, SBA. Pt did not experience any LOB, though he walked at a slowed pace and with increased caution when walking on a decline.     PATIENT EDUCATION:  Education  details: HEP Person educated: Patient and Spouse Education method: Medical illustrator Education comprehension: verbalized understanding and returned demonstration   HOME EXERCISE PROGRAM: See handouts   ASSESSMENT:   CLINICAL IMPRESSION:   Tx. Limited by an increase in R knee pain with all wt. Bearing/ standing tasks.  Pt. Had to decrease resistance on Nustep today secondary to an increase in R knee pain.   Pt. Unable to relax B UT/shoulder while using rollator and //-bars in clinic due to fear of falling/ R knee pain.  Pt. remains limited by pain in his R knee for forward stepping during gait. Pt. Will benefit from skilled PT services to increase standing tolerance/ walking household distances with improved balance/ decrease fall risk.     OBJECTIVE IMPAIRMENTS: Abnormal gait, decreased activity tolerance, decreased balance, decreased endurance, decreased  mobility, difficulty walking, decreased strength, hypomobility, improper body mechanics, postural dysfunction, and pain.    ACTIVITY LIMITATIONS: standing, stairs, transfers, and locomotion level   PARTICIPATION LIMITATIONS: community activity and church   PERSONAL FACTORS: Fitness and Past/current experiences are also affecting patient's functional outcome.    REHAB POTENTIAL: Good   CLINICAL DECISION MAKING: Evolving/moderate complexity   EVALUATION COMPLEXITY: Moderate     GOALS: Goals reviewed with patient? Yes   LONG TERM GOALS: Target date: 05/22/24   Pt. Will increase B hip/LE muscle strength 1/2 muscle grade to improve transfers/ standing tolerance/   Baseline:  see above; 7/15: see above  Goal status: Partially met   2.  Pt. Standing from standard height chair in rollator with 1 attempts and use of 1 UE assist safely. Baseline:  Extra time/ B UE assist.  Prefers pulling up; 7/15: requires BUE support to stand  Goal status: Partially met   3.  Pt. Will increase Berg balance test score to >35/56 to decrease  fall risk/ improve balance with walking.   Baseline: initial 25 (high fall risk); 7/15: 19/56 Goal status: On-going   4.  Pt. Able to ambulate from car into [facility] [employer] with use of rollator and SBA/mod. I safely to improve functional mobility.   Baseline: pt. Requires w/c with increase walking distances.;  7/15: Requires 1 person assist to ambulate with rollator into meeting Goal status: Ongoing   PLAN:   PT FREQUENCY: 1x/week   PT DURATION: 6 weeks   PLANNED INTERVENTIONS: 97110-Therapeutic exercises, 97530- Therapeutic activity, 97112- Neuromuscular re-education, 97535- Self Care, 02859- Manual therapy, [phone]- Gait training, Patient/Family education, Balance training, Stair training, Joint mobilization, Cryotherapy, and Moist heat   PLAN FOR NEXT SESSION:  Increase standing tolerance/ check R knee   Ozell JAYSON Sero, PT, DPT # [phone] Physical Therapist - [facility] 04/17/2024, 3:51 PM

## 2024-05-08 ENCOUNTER — Encounter: Admitting: Physical Therapy

## 2024-05-09 ENCOUNTER — Ambulatory Visit: Admitting: Physical Therapy

## 2024-05-09 ENCOUNTER — Encounter: Payer: Self-pay | Admitting: Physical Therapy

## 2024-05-09 DIAGNOSIS — R293 Abnormal posture: Secondary | ICD-10-CM

## 2024-05-09 DIAGNOSIS — R278 Other lack of coordination: Secondary | ICD-10-CM

## 2024-05-09 DIAGNOSIS — R269 Unspecified abnormalities of gait and mobility: Secondary | ICD-10-CM

## 2024-05-09 DIAGNOSIS — R2689 Other abnormalities of gait and mobility: Secondary | ICD-10-CM

## 2024-05-09 DIAGNOSIS — M6281 Muscle weakness (generalized): Secondary | ICD-10-CM

## 2024-05-09 NOTE — Therapy (Signed)
 OUTPATIENT PHYSICAL THERAPY LOWER EXTREMITY TREATMENT   Patient Name: Mike Holloway MRN: [id number] DOB:April 28, 1941, 83 y.o., male Today's Date: 05/09/2024   END OF SESSION:  PT End of Session - 05/09/24 1349     Visit Number 29    Number of Visits 40    Date for PT Re-Evaluation 05/22/24    PT Start Time 0145    PT Stop Time 0236    PT Time Calculation (min) 51 min    Equipment Utilized During Treatment Gait belt    Activity Tolerance Patient limited by fatigue;Patient tolerated treatment well    Behavior During Therapy WFL for tasks assessed/performed                Past Medical History:  Diagnosis Date   Arthritis     Atrial flutter (HCC)     Diabetes mellitus (HCC)     Essential tremor     Essential tremor      deep brain stimulator    Hypercholesteremia     Hypertension     Incontinence     Non-Hodgkin lymphoma (HCC)      grew on the testical   SIADH (syndrome of inappropriate ADH production) (HCC)     Sleep apnea     Stroke [facility])               Past Surgical History:  Procedure Laterality Date   ABLATION       APPENDECTOMY       CATARACT EXTRACTION, BILATERAL       COLONOSCOPY WITH PROPOFOL  N/A 03/17/2018    Procedure: COLONOSCOPY WITH PROPOFOL ;  Surgeon: Therisa Bi, MD;  Location: [facility] ENDOSCOPY;  Service: Gastroenterology;  Laterality: N/A;   DEEP BRAIN STIMULATOR PLACEMENT       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 ENDOSCOPY;  Service: Gastroenterology;  Laterality: N/A;   HEMORRHOID SURGERY       HERNIA REPAIR       HIP FRACTURE SURGERY       INTRAMEDULLARY (IM) NAIL INTERTROCHANTERIC Right 09/16/2018    Procedure: INTRAMEDULLARY (IM) NAIL INTERTROCHANTRIC;  Surgeon: Kathlynn Sharper, MD;  Location: [facility] ORS;  Service: Orthopedics;  Laterality: Right;   IR CATHETER TUBE CHANGE   11/22/2018   NASAL SINUS SURGERY       ORCHIECTOMY       TONSILLECTOMY                Patient  Active Problem List    Diagnosis Date Noted   DVT femoral (deep venous thrombosis) with thrombophlebitis, left (HCC) 02/06/2020   Lymphedema 04/15/2019   Bilateral leg edema 01/25/2019   Hyponatremia 12/24/2018   SIADH (syndrome of inappropriate ADH production) (HCC) 12/24/2018   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) 10/27/2018   Generalized weakness 10/14/2018   Hip fracture (HCC) 09/15/2018   Moderate mitral insufficiency 08/16/2018   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   HLD (hyperlipidemia) 03/24/2018   Contusion of right knee 02/14/2018   Bradycardia 12/28/2017   Status post right unicompartmental knee replacement 11/03/2017   Tremor 09/04/2016   Chronic pain of right knee 08/10/2016   Right ankle pain 08/10/2016   Chronic venous insufficiency 05/13/2016   Non-Hodgkin's lymphoma (HCC) 12/11/2015   Urinary retention 09/08/2015   Lymphoma, non-Hodgkin's (HCC) 04/03/2015   Breathlessness on exertion 11/21/2014   Breath shortness 11/21/2014   Arthropathy 11/07/2014  Atrial flutter, paroxysmal (HCC) 11/07/2014   Type 2 diabetes mellitus (HCC) 11/07/2014   Benign essential tremor 11/07/2014   Benign essential HTN 11/07/2014   Mixed incontinence 11/07/2014   Hypercholesterolemia without hypertriglyceridemia 11/07/2014   Apnea, sleep 11/07/2014   Controlled type 2 diabetes mellitus without complication (HCC) 11/07/2014   Pure hypercholesterolemia 11/07/2014   Other abnormalities of gait and mobility 11/02/2011   Decreased mobility 11/02/2011   Abnormal gait 06/29/2011   Discoordination 06/29/2011    PCP: Jeffie Cheryl BRAVO, MD   REFERRING PROVIDER: Jeffie Cheryl BRAVO, MD   REFERRING DIAG: Gait instability   THERAPY DIAG:  Abnormal posture   Gait difficulty   Balance problem   Other lack of coordination   Muscle weakness (generalized)   Rationale for Evaluation and Treatment:  Rehabilitation   ONSET DATE: Chronic   SUBJECTIVE:    SUBJECTIVE STATEMENT: Pt. Known well to PT clinic.  Pt. Had a fall in August and requires assist to return to standing.  Pts. Wife reports pt. Is having increase difficulty with standing/walking.  Pt. Using rollator for household mobility and reports limited tasks out of house.  Pt. C/o 1/10 R knee pain and no current treatment plan.  Pt. Has chronic h/o R knee pain and has tried injections/ brace in past with no benefit.  Pt. Able to walk into church with rollator but requires use of w/c when walking into [place] [employer].  Pt. Able to ascend stairs at son's house with assist and step to pattern.     PERTINENT HISTORY: See MD notes/ recent SLP and OT notes.     PAIN:  Are you having pain? Yes: NPRS scale: 1/10 Pain location: R knee Pain description: sharp/ aching Aggravating factors: standing/walking Relieving factors: sitting   PRECAUTIONS: Fall   RED FLAGS: Bowel or bladder incontinence: Yes:           WEIGHT BEARING RESTRICTIONS: No   FALLS:  Has patient fallen in last 6 months? Yes. Number of falls 1   LIVING ENVIRONMENT: Lives with: lives with their spouse Lives in: House/apartment Stairs: No Has following equipment at home: Environmental consultant - 4 wheeled and Grab bars   OCCUPATION: Retired   PLOF: Requires assistive device for independence   PATIENT GOALS: Improve LE strength/ gait/ balance.  Decrease fall risk.     NEXT MD VISIT: PRN   OBJECTIVE:  Note: Objective measures were completed at Evaluation unless otherwise noted.   PATIENT SURVEYS:  FOTO initial 29/goal 69   COGNITION: Overall cognitive status: Impaired                                  SENSATION: Not tested   EDEMA:  NT   MUSCLE LENGTH: NT   POSTURE: rounded shoulders and forward head   PALPATION: R knee joint line tenderness   LOWER EXTREMITY ROM:              B UE/ LE AROM WFL.  Pt. Pain limited with R knee flexion/extension  OP.    LOWER EXTREMITY MMT:   MMT Right eval Left eval  Hip flexion 4- 4-  Hip extension      Hip abduction      Hip adduction      Hip internal rotation      Hip external rotation      Knee flexion 5 5  Knee extension 4 4  Ankle dorsiflexion 4  4  Ankle plantarflexion      Ankle inversion      Ankle eversion       (Blank rows = not tested)   B shoulder: flexion 5/5, abduction 4/5, tricep 4+/5, bicep 5/5.     LOWER EXTREMITY SPECIAL TESTS:  NT   FUNCTIONAL TESTS:  5 times sit to stand: TBD 6 minute walk test: TBD Berg Balance Scale: 25/56 (significant fall risk)   GAIT: Distance walked: in clinic Assistive device utilized: Walker - 4 wheeled Level of assistance: Modified independence and SBA Comments: Antalgic gait pattern with heavy UE assist/ use of brakes on rollator.  Increase c/o R knee pain and fearful of R knee buckling.  High fall risk.  BLE MMT:            Hip flexion R/L: 3/3+           Knee ext R/L: 4/4+           Hip abd R/L: 5/5           Hip add R/L: 4+/4+   BERG: 19/56                                                                                                               TREATMENT DATE: 05/09/2024   Subjective:  Pt. Reports 1-2/10 R knee pain while walking into clinic today.  No c/o back pain reported.  Pts. Brother is coming to visit for the next 9 days.     There.ex.:  No charge Nustep L4 10 min. B LE only, moist hot pack on lower back.  Warm-up/ discussed walking at home.   Theract:  High marching in //-bars 4 laps/ lateral walking in //-bars 3 laps. UE assist required secondary to R knee pain/gross LE strength deficits.  Standing wt. Shifting at //-bars (mirror feedback)- added functional reaching outside BOS on //-bars (SBA from PT for safety).  Added B shoulder flexion.    Standing 6 recip. Step touches in front of //-bars. 2 sets of 2 minutes   PT walked with pt to his car with rollator with focus on consistent recip. Step pattern  and SBA. Pt did not experience any LOB, though he walked at a slowed pace and with increased caution when walking over carpet at front of clinic.     PATIENT EDUCATION:  Education details: HEP Person educated: Patient and Spouse Education method: Medical illustrator Education comprehension: verbalized understanding and returned demonstration   HOME EXERCISE PROGRAM: See handouts   ASSESSMENT:   CLINICAL IMPRESSION:   Focus of today's tx. Session to increase pt. Confidence with standing and ambulating with less UE support. Pt. Able to decrease bilateral grip strength for UE support during taps on 6 inch step on this day with verbal cueing. Pt. Required CGA for all standing and dynamic activities for safety but did require min A on one occasion during standing with reaching outside BOS in // bars to prevent LOB anteriorly. Pt. Did not report an increase in knee pain at  the end of today's tx. Session. Will benefit from skilled PT services to increase standing tolerance/ walking household distances with improved balance/ decrease fall risk.     OBJECTIVE IMPAIRMENTS: Abnormal gait, decreased activity tolerance, decreased balance, decreased endurance, decreased mobility, difficulty walking, decreased strength, hypomobility, improper body mechanics, postural dysfunction, and pain.    ACTIVITY LIMITATIONS: standing, stairs, transfers, and locomotion level   PARTICIPATION LIMITATIONS: community activity and church   PERSONAL FACTORS: Fitness and Past/current experiences are also affecting patient's functional outcome.    REHAB POTENTIAL: Good   CLINICAL DECISION MAKING: Evolving/moderate complexity   EVALUATION COMPLEXITY: Moderate     GOALS: Goals reviewed with patient? Yes   LONG TERM GOALS: Target date: 05/22/24   Pt. Will increase B hip/LE muscle strength 1/2 muscle grade to improve transfers/ standing tolerance/   Baseline:  see above; 7/15: see above  Goal status:  Partially met   2.  Pt. Standing from standard height chair in rollator with 1 attempts and use of 1 UE assist safely. Baseline:  Extra time/ B UE assist.  Prefers pulling up; 7/15: requires BUE support to stand  Goal status: Partially met   3.  Pt. Will increase Berg balance test score to >35/56 to decrease fall risk/ improve balance with walking.   Baseline: initial 25 (high fall risk); 7/15: 19/56 Goal status: On-going   4.  Pt. Able to ambulate from car into [facility] [employer] with use of rollator and SBA/mod. I safely to improve functional mobility.   Baseline: pt. Requires w/c with increase walking distances.;  7/15: Requires 1 person assist to ambulate with rollator into meeting Goal status: Ongoing   PLAN:   PT FREQUENCY: 1x/week   PT DURATION: 6 weeks   PLANNED INTERVENTIONS: 97110-Therapeutic exercises, 97530- Therapeutic activity, 97112- Neuromuscular re-education, 97535- Self Care, [id number]- Manual therapy, [phone]- Gait training, Patient/Family education, Balance training, Stair training, Joint mobilization, Cryotherapy, and Moist heat   PLAN FOR NEXT SESSION:  Increase tolerance with walking and dynamic standing tasks outside BOS.    Ozell JAYSON Sero, PT, DPT # [id number] Curtistine Bracket, SPT Physical Therapist - [facility] 04/17/2024, 3:51 PM

## 2024-05-10 ENCOUNTER — Ambulatory Visit (INDEPENDENT_AMBULATORY_CARE_PROVIDER_SITE_OTHER): Admitting: Podiatry

## 2024-05-10 DIAGNOSIS — M79674 Pain in right toe(s): Secondary | ICD-10-CM | POA: Diagnosis not present

## 2024-05-10 DIAGNOSIS — Z794 Long term (current) use of insulin: Secondary | ICD-10-CM

## 2024-05-10 DIAGNOSIS — E119 Type 2 diabetes mellitus without complications: Secondary | ICD-10-CM

## 2024-05-10 DIAGNOSIS — M79675 Pain in left toe(s): Secondary | ICD-10-CM

## 2024-05-10 DIAGNOSIS — B351 Tinea unguium: Secondary | ICD-10-CM

## 2024-05-10 NOTE — Progress Notes (Unsigned)
  Subjective:  Patient ID: Essam Lowdermilk, male    DOB: 27-Dec-1940,  MRN: [id number]  Chief Complaint  Patient presents with   Nail Problem   83 y.o. male returns for the above complaint.  Patient presents with thickened elongated showing mycotic toenails x 10 mild pain on palpation ambulate for me to be done he is unable to do it himself.  He also has secondary complaint of getting diabetic shoes.  He is a diabetic with controlled A1c.  He would like to obtain diabetic shoes given his hammertoe contractures  Objective:  There were no vitals filed for this visit. Podiatric Exam: Vascular: dorsalis pedis and posterior tibial pulses are palpable bilateral. Capillary return is immediate. Temperature gradient is WNL. Skin turgor WNL  Sensorium: Normal Semmes Weinstein monofilament test. Normal tactile sensation bilaterally. Nail Exam: Pt has thick disfigured discolored nails with subungual debris noted bilateral entire nail hallux through fifth toenails.  Pain on palpation to the nails. Ulcer Exam: There is no evidence of ulcer or pre-ulcerative changes or infection. Orthopedic Exam: Muscle tone and strength are WNL. No limitations in general ROM. No crepitus or effusions noted.  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 No infection or ulcers    Assessment & Plan:   No diagnosis found.   Patient was evaluated and treated and all questions answered.  Hammertoe bilateral with underlying diabetes -All questions and concerns were discussed with the patient in extensive detail -Given the deformity and the contracture of the hammertoe in the setting of diabetes patient will benefit from diabetic shoes.  He will be scheduled to see Trish for diabetic shoes  Onychomycosis with pain  -Nails palliatively debrided as below. -Educated on self-care  Procedure: Nail Debridement Rationale:  pain  Type of Debridement: manual, sharp debridement. Instrumentation: Nail nipper, rotary burr. Number of Nails: 10  Procedures and Treatment: Consent by patient was obtained for treatment procedures. The patient understood the discussion of treatment and procedures well. All questions were answered thoroughly reviewed. Debridement of mycotic and hypertrophic toenails, 1 through 5 bilateral and clearing of subungual debris. No ulceration, no infection noted.  Return Visit-Office Procedure: Patient instructed to return to the office for a follow up visit 3 months for continued evaluation and treatment.  Franky Blanch, DPM    No follow-ups on file.

## 2024-05-14 ENCOUNTER — Encounter: Payer: Self-pay | Admitting: Physical Therapy

## 2024-05-14 ENCOUNTER — Ambulatory Visit: Admitting: Physical Therapy

## 2024-05-14 DIAGNOSIS — R278 Other lack of coordination: Secondary | ICD-10-CM

## 2024-05-14 DIAGNOSIS — R2689 Other abnormalities of gait and mobility: Secondary | ICD-10-CM

## 2024-05-14 DIAGNOSIS — R269 Unspecified abnormalities of gait and mobility: Secondary | ICD-10-CM

## 2024-05-14 DIAGNOSIS — M6281 Muscle weakness (generalized): Secondary | ICD-10-CM

## 2024-05-14 DIAGNOSIS — R293 Abnormal posture: Secondary | ICD-10-CM

## 2024-05-14 NOTE — Therapy (Signed)
 OUTPATIENT PHYSICAL THERAPY LOWER EXTREMITY TREATMENT Physical Therapy Progress Note  Dates of reporting period  02/27/24   to   05/14/24    Patient Name: Mike Holloway MRN: [id number] DOB:[birth year], 83 y.o., male Today's Date: 05/14/2024   END OF SESSION:  PT End of Session - 05/14/24 1455     Visit Number 30    Number of Visits 40    Date for PT Re-Evaluation 05/22/24    PT Start Time 1300    PT Stop Time 1401    PT Time Calculation (min) 61 min    Equipment Utilized During Treatment Gait belt    Activity Tolerance Patient limited by fatigue;Patient tolerated treatment well    Behavior During Therapy WFL for tasks assessed/performed                Past Medical History:  Diagnosis Date   Arthritis     Atrial flutter (HCC)     Diabetes mellitus (HCC)     Essential tremor     Essential tremor      deep brain stimulator    Hypercholesteremia     Hypertension     Incontinence     Non-Hodgkin lymphoma (HCC)      grew on the testical   SIADH (syndrome of inappropriate ADH production) (HCC)     Sleep apnea     Stroke [facility])               Past Surgical History:  Procedure Laterality Date   ABLATION       APPENDECTOMY       CATARACT EXTRACTION, BILATERAL       COLONOSCOPY WITH PROPOFOL  N/A 03/17/2018    Procedure: COLONOSCOPY WITH PROPOFOL ;  Surgeon: Therisa Bi, MD;  Location: [facility] ENDOSCOPY;  Service: Gastroenterology;  Laterality: N/A;   DEEP BRAIN STIMULATOR PLACEMENT       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    Procedure: FECAL TRANSPLANT;  Surgeon: Therisa Bi, MD;  Location: [facility] ENDOSCOPY;  Service: Gastroenterology;  Laterality: N/A;   HEMORRHOID SURGERY       HERNIA REPAIR       HIP FRACTURE SURGERY       INTRAMEDULLARY (IM) NAIL INTERTROCHANTERIC Right 09/16/2018    Procedure: INTRAMEDULLARY (IM) NAIL INTERTROCHANTRIC;  Surgeon: Kathlynn Sharper, MD;  Location: [facility] ORS;  Service: Orthopedics;  Laterality: Right;   IR CATHETER TUBE CHANGE   11/22/2018   NASAL  SINUS SURGERY       ORCHIECTOMY       TONSILLECTOMY                Patient Active Problem List    Diagnosis Date Noted   DVT femoral (deep venous thrombosis) with thrombophlebitis, left (HCC) 02/06/2020   Lymphedema 04/15/2019   Bilateral leg edema 01/25/2019   Hyponatremia 12/24/2018   SIADH (syndrome of inappropriate ADH production) (HCC) 12/24/2018   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) 10/27/2018   Generalized weakness 10/14/2018   Hip fracture (HCC) 09/15/2018   Moderate mitral insufficiency 08/16/2018   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   HLD (hyperlipidemia) 03/24/2018   Contusion of right knee 02/14/2018   Bradycardia 12/28/2017   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   Tremor 09/04/2016   Chronic pain of right knee 08/10/2016   Right ankle pain 08/10/2016   Chronic venous insufficiency 05/13/2016   Non-Hodgkin's lymphoma (HCC) 12/11/2015   Urinary retention 09/08/2015   Lymphoma, non-Hodgkin's (  HCC) 04/03/2015   Breathlessness on exertion 11/21/2014   Breath shortness 11/21/2014   Arthropathy 11/07/2014   Atrial flutter, paroxysmal (HCC) 11/07/2014   Type 2 diabetes mellitus (HCC) 11/07/2014   Benign essential tremor 11/07/2014   Benign essential HTN 11/07/2014   Mixed incontinence 11/07/2014   Hypercholesterolemia without hypertriglyceridemia 11/07/2014   Apnea, sleep 11/07/2014   Controlled type 2 diabetes mellitus without complication (HCC) 11/07/2014   Pure hypercholesterolemia 11/07/2014   Other abnormalities of gait and mobility 11/02/2011   Decreased mobility 11/02/2011   Abnormal gait 06/29/2011   Discoordination 06/29/2011    PCP: Jeffie Cheryl BRAVO, MD   REFERRING PROVIDER: Jeffie Cheryl BRAVO, MD   REFERRING DIAG: Gait instability   THERAPY DIAG:  Abnormal posture   Gait difficulty   Balance problem   Other lack of coordination    Muscle weakness (generalized)   Rationale for Evaluation and Treatment: Rehabilitation   ONSET DATE: Chronic   SUBJECTIVE:    SUBJECTIVE STATEMENT: Pt. Known well to PT clinic.  Pt. Had a fall in August and requires assist to return to standing.  Pts. Wife reports pt. Is having increase difficulty with standing/walking.  Pt. Using rollator for household mobility and reports limited tasks out of house.  Pt. C/o 1/10 R knee pain and no current treatment plan.  Pt. Has chronic h/o R knee pain and has tried injections/ brace in past with no benefit.  Pt. Able to walk into church with rollator but requires use of w/c when walking into [place] [employer].  Pt. Able to ascend stairs at son's house with assist and step to pattern.     PERTINENT HISTORY: See MD notes/ recent SLP and OT notes.     PAIN:  Are you having pain? Yes: NPRS scale: 1/10 Pain location: R knee Pain description: sharp/ aching Aggravating factors: standing/walking Relieving factors: sitting   PRECAUTIONS: Fall   RED FLAGS: Bowel or bladder incontinence: Yes:           WEIGHT BEARING RESTRICTIONS: No   FALLS:  Has patient fallen in last 6 months? Yes. Number of falls 1   LIVING ENVIRONMENT: Lives with: lives with their spouse Lives in: House/apartment Stairs: No Has following equipment at home: Environmental consultant - 4 wheeled and Grab bars   OCCUPATION: Retired   PLOF: Requires assistive device for independence   PATIENT GOALS: Improve LE strength/ gait/ balance.  Decrease fall risk.     NEXT MD VISIT: PRN   OBJECTIVE:  Note: Objective measures were completed at Evaluation unless otherwise noted.   PATIENT SURVEYS:  FOTO initial 29/goal 81   COGNITION: Overall cognitive status: Impaired                                  SENSATION: Not tested   EDEMA:  NT   MUSCLE LENGTH: NT   POSTURE: rounded shoulders and forward head   PALPATION: R knee joint line tenderness   LOWER EXTREMITY ROM:               B UE/ LE AROM WFL.  Pt. Pain limited with R knee flexion/extension OP.    LOWER EXTREMITY MMT:   MMT Right eval Left eval  Hip flexion 4- 4-  Hip extension      Hip abduction      Hip adduction      Hip internal rotation      Hip external  rotation      Knee flexion 5 5  Knee extension 4 4  Ankle dorsiflexion 4 4  Ankle plantarflexion      Ankle inversion      Ankle eversion       (Blank rows = not tested)   B shoulder: flexion 5/5, abduction 4/5, tricep 4+/5, bicep 5/5.     LOWER EXTREMITY SPECIAL TESTS:  NT   FUNCTIONAL TESTS:  5 times sit to stand: TBD 6 minute walk test: TBD Berg Balance Scale: 25/56 (significant fall risk)   GAIT: Distance walked: in clinic Assistive device utilized: Walker - 4 wheeled Level of assistance: Modified independence and SBA Comments: Antalgic gait pattern with heavy UE assist/ use of brakes on rollator.  Increase c/o R knee pain and fearful of R knee buckling.  High fall risk.  BLE MMT:            Hip flexion R/L: 3/3+           Knee ext R/L: 4/4+           Hip abd R/L: 5/5           Hip add R/L: 4+/4+   BERG: 19/56                                                                                                               TREATMENT DATE: 05/14/2024   Subjective:  Pt. Reports 1-2/10 R knee/ low back pain while walking into clinic today.  No recent fall/ no new complaints.  Pts. Brother is currently visiting pt. Until this weekend.     There.ex.:  No charge Nustep L4 10 min. B LE only, moist hot pack on lower back.  Warm-up/ discussed walking at home.   Theract:  High marching in //-bars 4 laps/ lateral walking in //-bars 3 laps. UE assist required secondary to R knee pain/gross LE strength deficits.  Lateral walking in //-bars 3 laps.  Tandem gait in //-bars with heavy UE assist (improved hip control).    Sit to stands from chair in //-bars with UE assist required.  Increase c/o low back pain with prolonged standing.     Seated marching/ LAQ (reassessment of patellar tracking)/ heel raises 20x.  R knee pain reported.   Walking in gym/ hallway with rollator and consistent cadence.  Focus on walking for >5 minutes before rest break.  Limited by increase back pain.     PT walked with pt to his car with rollator with focus on consistent recip. Step pattern and SBA. Pt did not experience any LOB, though he walked at a slowed pace and with increased caution when walking over carpet at front of clinic.    Reviewed importance of daily walking in home/ outside.      PATIENT EDUCATION:  Education details: HEP Person educated: Patient and Spouse Education method: Medical illustrator Education comprehension: verbalized understanding and returned demonstration   HOME EXERCISE PROGRAM: See handouts   ASSESSMENT:   CLINICAL IMPRESSION:   Focus of  today's tx. Session to increase pt. confidence with standing and ambulating with less UE support.  Pt. Required SBA for all standing and dynamic activities for safety and cuing to correct posture/ decrease UT activation.  Pt. Had continued back/ knee pain with all prolonged standing/ walking tasks.  Pt. will benefit from skilled PT services to increase standing tolerance/ walking household distances with improved balance/ decrease fall risk.     OBJECTIVE IMPAIRMENTS: Abnormal gait, decreased activity tolerance, decreased balance, decreased endurance, decreased mobility, difficulty walking, decreased strength, hypomobility, improper body mechanics, postural dysfunction, and pain.    ACTIVITY LIMITATIONS: standing, stairs, transfers, and locomotion level   PARTICIPATION LIMITATIONS: community activity and church   PERSONAL FACTORS: Fitness and Past/current experiences are also affecting patient's functional outcome.    REHAB POTENTIAL: Good   CLINICAL DECISION MAKING: Evolving/moderate complexity   EVALUATION COMPLEXITY: Moderate     GOALS: Goals reviewed  with patient? Yes   LONG TERM GOALS: Target date: 05/22/24   Pt. Will increase B hip/LE muscle strength 1/2 muscle grade to improve transfers/ standing tolerance/   Baseline:  see above; 7/15: see above  Goal status: Partially met   2.  Pt. Standing from standard height chair in rollator with 1 attempts and use of 1 UE assist safely. Baseline:  Extra time/ B UE assist.  Prefers pulling up; 7/15: requires BUE support to stand  Goal status: Partially met   3.  Pt. Will increase Berg balance test score to >35/56 to decrease fall risk/ improve balance with walking.   Baseline: initial 25 (high fall risk); 7/15: 19/56 Goal status: On-going   4.  Pt. Able to ambulate from car into [facility] [employer] with use of rollator and SBA/mod. I safely to improve functional mobility.   Baseline: pt. Requires w/c with increase walking distances.;  7/15: Requires 1 person assist to ambulate with rollator into meeting Goal status: Ongoing   PLAN:   PT FREQUENCY: 1x/week   PT DURATION: 6 weeks   PLANNED INTERVENTIONS: 97110-Therapeutic exercises, 97530- Therapeutic activity, 97112- Neuromuscular re-education, 97535- Self Care, [id number]- Manual therapy, [phone]- Gait training, Patient/Family education, Balance training, Stair training, Joint mobilization, Cryotherapy, and Moist heat   PLAN FOR NEXT SESSION:  1 more tx. Session/ Discuss walking and HEP.     Ozell JAYSON Sero, PT, DPT # [id number] Curtistine Bracket, SPT Physical Therapist - [facility]  [facility] 05/14/2024

## 2024-05-15 ENCOUNTER — Encounter: Admitting: Physical Therapy

## 2024-05-22 ENCOUNTER — Encounter: Admitting: Physical Therapy

## 2024-05-22 NOTE — Progress Notes (Addendum)
 Patient presents to the office today for diabetic shoe and insole measuring.  Patient was measured with brannock device to determine size and width for 1 pair of extra depth shoes 2pr totoal contact Lift to Right shoe at 1/2 for LLD   Documentation of medical necessity will be sent to patient's treating diabetic doctor to verify and sign.   Patient's diabetic provider: Cheryl Jericho MD   Shoes and insoles will be ordered at that time and patient will be notified for an appointment for fitting when they arrive.   Shoe size (per patient): 11.5 Patient shoe selection-  Shoe choice:   410   Shoe size ordered: 11.5

## 2024-05-23 ENCOUNTER — Encounter: Payer: Self-pay | Admitting: Physician Assistant

## 2024-05-23 ENCOUNTER — Ambulatory Visit: Admitting: Physical Therapy

## 2024-05-23 ENCOUNTER — Ambulatory Visit (INDEPENDENT_AMBULATORY_CARE_PROVIDER_SITE_OTHER): Admitting: Physician Assistant

## 2024-05-23 VITALS — BP 166/77 | HR 69 | Ht 68.0 in | Wt 202.2 lb

## 2024-05-23 DIAGNOSIS — R278 Other lack of coordination: Secondary | ICD-10-CM

## 2024-05-23 DIAGNOSIS — R2689 Other abnormalities of gait and mobility: Secondary | ICD-10-CM

## 2024-05-23 DIAGNOSIS — Z466 Encounter for fitting and adjustment of urinary device: Secondary | ICD-10-CM

## 2024-05-23 DIAGNOSIS — M6281 Muscle weakness (generalized): Secondary | ICD-10-CM

## 2024-05-23 DIAGNOSIS — R293 Abnormal posture: Secondary | ICD-10-CM

## 2024-05-23 DIAGNOSIS — R269 Unspecified abnormalities of gait and mobility: Secondary | ICD-10-CM

## 2024-05-23 NOTE — Progress Notes (Signed)
 Cath Change/ Replacement  Patient is present today for a catheter change due to urinary retention.  8ml of water was removed from the balloon, a 16FR coude foley cath was removed without difficulty.  Patient was cleaned and prepped in a sterile fashion with betadine and 2% lidocaine  jelly was instilled into the urethra. A 16 FR coude foley cath was replaced into the bladder, no complications were noted. Urine return was noted 10ml and urine was yellow in color. The balloon was filled with 10ml of sterile water. A leg bag was attached for drainage.  Patient tolerated well.    Performed by: Lucie Hones, PA-C   Follow up: Return in about 4 weeks (around 06/20/2024) for Catheter exchange.

## 2024-05-23 NOTE — Therapy (Unsigned)
 OUTPATIENT PHYSICAL THERAPY LOWER EXTREMITY TREATMENT/DISCHARGE Physical Therapy Progress Note  Dates of reporting period  02/27/24   to   05/14/24    Patient Name: Mike Holloway MRN: [id number] DOB:1941/03/06, 83 y.o., male Today's Date: 05/23/2024   END OF SESSION:  PT End of Session - 05/23/24 1727     Visit Number 31    Number of Visits 40    Date for PT Re-Evaluation 05/22/24    PT Start Time 1600    PT Stop Time 1654    PT Time Calculation (min) 54 min    Equipment Utilized During Treatment Gait belt    Activity Tolerance Patient limited by fatigue;Patient tolerated treatment well    Behavior During Therapy WFL for tasks assessed/performed                Past Medical History:  Diagnosis Date   Arthritis     Atrial flutter (HCC)     Diabetes mellitus (HCC)     Essential tremor     Essential tremor      deep brain stimulator    Hypercholesteremia     Hypertension     Incontinence     Non-Hodgkin lymphoma (HCC)      grew on the testical   SIADH (syndrome of inappropriate ADH production) (HCC)     Sleep apnea     Stroke [facility])               Past Surgical History:  Procedure Laterality Date   ABLATION       APPENDECTOMY       CATARACT EXTRACTION, BILATERAL       COLONOSCOPY WITH PROPOFOL  N/A 03/17/2018    Procedure: COLONOSCOPY WITH PROPOFOL ;  Surgeon: Therisa Bi, MD;  Location: 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    Procedure: FECAL TRANSPLANT;  Surgeon: Therisa Bi, MD;  Location: [facility] ENDOSCOPY;  Service: Gastroenterology;  Laterality: N/A;   HEMORRHOID SURGERY       HERNIA REPAIR       HIP FRACTURE SURGERY       INTRAMEDULLARY (IM) NAIL INTERTROCHANTERIC Right 09/16/2018    Procedure: INTRAMEDULLARY (IM) NAIL INTERTROCHANTRIC;  Surgeon: Kathlynn Sharper, MD;  Location: [facility] ORS;  Service: Orthopedics;  Laterality: Right;   IR CATHETER TUBE CHANGE   11/22/2018    NASAL SINUS SURGERY       ORCHIECTOMY       TONSILLECTOMY                Patient Active Problem List    Diagnosis Date Noted   DVT femoral (deep venous thrombosis) with thrombophlebitis, left (HCC) 02/06/2020   Lymphedema 04/15/2019   Bilateral leg edema 01/25/2019   Hyponatremia 12/24/2018   SIADH (syndrome of inappropriate ADH production) (HCC) 12/24/2018   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) 10/27/2018   Generalized weakness 10/14/2018   Hip fracture (HCC) 09/15/2018   Moderate mitral insufficiency 08/16/2018   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   HLD (hyperlipidemia) 03/24/2018   Contusion of right knee 02/14/2018   Bradycardia 12/28/2017   Status post right unicompartmental knee replacement 11/03/2017   Tremor 09/04/2016   Chronic pain of right knee 08/10/2016   Right ankle pain 08/10/2016   Chronic venous insufficiency 05/13/2016   Non-Hodgkin's lymphoma (HCC) 12/11/2015   Urinary retention 09/08/2015   Lymphoma, non-Hodgkin's (  HCC) 04/03/2015   Breathlessness on exertion 11/21/2014   Breath shortness 11/21/2014   Arthropathy 11/07/2014   Atrial flutter, paroxysmal (HCC) 11/07/2014   Type 2 diabetes mellitus (HCC) 11/07/2014   Benign essential tremor 11/07/2014   Benign essential HTN 11/07/2014   Mixed incontinence 11/07/2014   Hypercholesterolemia without hypertriglyceridemia 11/07/2014   Apnea, sleep 11/07/2014   Controlled type 2 diabetes mellitus without complication (HCC) 11/07/2014   Pure hypercholesterolemia 11/07/2014   Other abnormalities of gait and mobility 11/02/2011   Decreased mobility 11/02/2011   Abnormal gait 06/29/2011   Discoordination 06/29/2011    PCP: Jeffie Cheryl BRAVO, MD   REFERRING PROVIDER: Jeffie Cheryl BRAVO, MD   REFERRING DIAG: Gait instability   THERAPY DIAG:  Abnormal posture   Gait difficulty   Balance problem   Other lack of  coordination   Muscle weakness (generalized)   Rationale for Evaluation and Treatment: Rehabilitation   ONSET DATE: Chronic   SUBJECTIVE:    SUBJECTIVE STATEMENT: Pt. Known well to PT clinic.  Pt. Had a fall in August and requires assist to return to standing.  Pts. Wife reports pt. Is having increase difficulty with standing/walking.  Pt. Using rollator for household mobility and reports limited tasks out of house.  Pt. C/o 1/10 R knee pain and no current treatment plan.  Pt. Has chronic h/o R knee pain and has tried injections/ brace in past with no benefit.  Pt. Able to walk into church with rollator but requires use of w/c when walking into [employer].  Pt. Able to ascend stairs at son's house with assist and step to pattern.     PERTINENT HISTORY: See MD notes/ recent SLP and OT notes.     PAIN:  Are you having pain? Yes: NPRS scale: 1/10 Pain location: R knee Pain description: sharp/ aching Aggravating factors: standing/walking Relieving factors: sitting   PRECAUTIONS: Fall   RED FLAGS: Bowel or bladder incontinence: Yes:           WEIGHT BEARING RESTRICTIONS: No   FALLS:  Has patient fallen in last 6 months? Yes. Number of falls 1   LIVING ENVIRONMENT: Lives with: lives with their spouse Lives in: House/apartment Stairs: No Has following equipment at home: Environmental consultant - 4 wheeled and Grab bars   OCCUPATION: Retired   PLOF: Requires assistive device for independence   PATIENT GOALS: Improve LE strength/ gait/ balance.  Decrease fall risk.     NEXT MD VISIT: PRN   OBJECTIVE:  Note: Objective measures were completed at Evaluation unless otherwise noted.   PATIENT SURVEYS:  FOTO initial 29/goal 33   COGNITION: Overall cognitive status: Impaired                                  SENSATION: Not tested   EDEMA:  NT   MUSCLE LENGTH: NT   POSTURE: rounded shoulders and forward head   PALPATION: R knee joint line tenderness   LOWER  EXTREMITY ROM:              B UE/ LE AROM WFL.  Pt. Pain limited with R knee flexion/extension OP.    LOWER EXTREMITY MMT:   MMT Right eval Left eval  Hip flexion 4- 4-  Hip extension      Hip abduction      Hip adduction      Hip internal rotation      Hip external  rotation      Knee flexion 5 5  Knee extension 4 4  Ankle dorsiflexion 4 4  Ankle plantarflexion      Ankle inversion      Ankle eversion       (Blank rows = not tested)   B shoulder: flexion 5/5, abduction 4/5, tricep 4+/5, bicep 5/5.     LOWER EXTREMITY SPECIAL TESTS:  NT   FUNCTIONAL TESTS:  5 times sit to stand: TBD 6 minute walk test: TBD Berg Balance Scale: 25/56 (significant fall risk)   GAIT: Distance walked: in clinic Assistive device utilized: Walker - 4 wheeled Level of assistance: Modified independence and SBA Comments: Antalgic gait pattern with heavy UE assist/ use of brakes on rollator.  Increase c/o R knee pain and fearful of R knee buckling.  High fall risk.  BLE MMT:            Hip flexion R/L: 3/3+           Knee ext R/L: 4/4+           Hip abd R/L: 5/5           Hip add R/L: 4+/4+   BERG: 19/56                                                                                                               TREATMENT DATE: 05/23/2024   Subjective:  Pt. Reports 2/10 R knee pain at start of today's session. Pt. Reports no new falls but states that he is tired at start of tx. session due to coming directly from Urology appointment where he had to do a lot of walking. Pt. 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    There.ex.:  No charge Nustep L5 10 min. B LE only, moist hot pack on lower back.  Warm-up/ discussed knee/low back symptoms.   Theract: Sit to stands from various chair heights with and without UE assist.  Increase c/o low back pain with prolonged standing and right knee pain  with concentric portion of exercise.  Walking in gym/ hallway with rollator and consistent cadence. ~90 ft. before requiring seated rest break.  Limited by increase right knee pain.     PT walked with pt to his car with rollator with focus on reciprocal step pattern and SBA. Pt did not experience any LOB, though he walked at a decreased gait speed and with increased caution when going over slight decline on outside sidewalk.   Reviewed importance of daily walking in home/ outside and HEP   Reassess LE MMTs  MMT Right eval Left eval  Hip flexion 3+ * 4-  Hip extension      Hip abduction  4+ 4+   Hip adduction 4 + 5   Hip internal rotation      Hip external rotation      Knee flexion 5 5  Knee extension 4 * 5  Ankle dorsiflexion 4 4  Ankle plantarflexion      Ankle inversion      Ankle eversion        PATIENT EDUCATION:  Education details: HEP Person educated: Patient and Spouse Education method: Medical illustrator Education comprehension: verbalized understanding and returned demonstration   HOME EXERCISE PROGRAM: See handouts   ASSESSMENT:   CLINICAL IMPRESSION:   Pt. Required CGA for all standing and dynamic activities for safety and verbal cuing to maintain upright posture. Pt. Completed sit to stands from various heights off blue mat table with CGA for safety and balance. Pt. Required one UE to assist to initiate power to stand at 20 inch seat height and no UE assistance required for 25 inch seat height. Pt. Required min A for controlled descent during stand to sit transfers due to current deficits in eccentric quad control. Pt. Required seated rest break after 90 ft. Of ambulation with rollator in gym/hallway secondary to fatigue. Pt. Reported increased fatigue in bilateral LEs at end of session.    Patient has demonstrated improvement in gross LE strength and improvements with dynamic functional balance with less falls reported from him and his wife. Pt. Is  still limited by right knee and low back pain, decreased tolerance to long ambulation distances, and gross bilateral hip/knee strength which affects his ability to complete functional transfers and ambulate household distances without pain. Pt. To be discharged from physical therapy services at this time after meeting desired balance improvements and decreasing fall risk.     OBJECTIVE IMPAIRMENTS: Abnormal gait, decreased activity tolerance, decreased balance, decreased endurance, decreased mobility, difficulty walking, decreased strength, hypomobility, improper body mechanics, postural dysfunction, and pain.    ACTIVITY LIMITATIONS: standing, stairs, transfers, and locomotion level   PARTICIPATION LIMITATIONS: community activity and church   PERSONAL FACTORS: Fitness and Past/current experiences are also affecting patient's functional outcome.    REHAB POTENTIAL: Good   CLINICAL DECISION MAKING: Evolving/moderate complexity   EVALUATION COMPLEXITY: Moderate     GOALS: Goals reviewed with patient? Yes   LONG TERM GOALS: Target date: 05/22/24   Pt. Will increase B hip/LE muscle strength 1/2 muscle grade to improve transfers/ standing tolerance/   Baseline:  see above; 7/15: see above 8/27: see above Goal status: Goal met   2.  Pt. Standing from standard height chair in rollator with 1 attempts and use of 1 UE assist safely. Baseline:  Extra time/ B UE assist.  Prefers pulling up; 7/15: requires BUE support to stand 8/27: one UE required at 20 inch seat height, no UE assist required for 25 inch seat height Goal status: Partially met   3.  Pt. Will increase Berg balance test score to >35/56 to decrease fall risk/ improve balance with walking.   Baseline: initial 25 (high fall risk); 7/15: 19/56 Goal status: Not met   4.  Pt. Able to ambulate from car into [facility] [employer] with use of rollator and SBA/mod. I safely to improve functional mobility.   Baseline: pt. Requires w/c  with increase walking distances.;  7/15: Requires 1 person assist to ambulate with rollator into meeting 08/27: Requires 1 person assist to ambulate with rollator Goal status: Not met    PLAN:   PT FREQUENCY: 1x/week   PT DURATION: 6 weeks   PLANNED INTERVENTIONS: 97110-Therapeutic exercises, 97530- Therapeutic activity, 97112- Neuromuscular re-education, 97535- Self Care, 02859- Manual therapy, [id number]- Gait training, Patient/Family education, Balance training, Stair training, Joint mobilization, Cryotherapy, and Moist heat  PLAN FOR NEXT SESSION:  Patient to be discharged from physical therapy at this time.     Ozell JAYSON Sero, PT, DPT # [id number] Curtistine Bracket, SPT Physical Therapist - [facility]  [facility] 05/23/2024

## 2024-05-30 ENCOUNTER — Encounter: Admitting: Physical Therapy

## 2024-06-08 ENCOUNTER — Telehealth: Payer: Self-pay

## 2024-06-08 NOTE — Telephone Encounter (Signed)
 Pts wife called and LVM. She's asked about the status of [name] dm shoes. She states his treating doctors office signed the [facility]care ppwk and faxed it back to us . I dont see the ppwk  anywhere. The fax number in safestep is different from the fax number in the website. Faxing to [phone]

## 2024-06-21 ENCOUNTER — Ambulatory Visit (INDEPENDENT_AMBULATORY_CARE_PROVIDER_SITE_OTHER): Admitting: Physician Assistant

## 2024-06-21 DIAGNOSIS — Z466 Encounter for fitting and adjustment of urinary device: Secondary | ICD-10-CM | POA: Diagnosis not present

## 2024-06-21 DIAGNOSIS — N481 Balanitis: Secondary | ICD-10-CM

## 2024-06-21 MED ORDER — NYSTATIN-TRIAMCINOLONE 100000-0.1 UNIT/GM-% EX OINT: 1.0000 | TOPICAL_OINTMENT | Freq: Two times a day (BID) | CUTANEOUS | 2 refills | Status: AC

## 2024-06-21 NOTE — Addendum Note (Signed)
 Addended by: Ronesha Heenan P on: 06/21/2024 05:08 PM   Modules accepted: Orders

## 2024-06-21 NOTE — Progress Notes (Signed)
 Cath Change/ Replacement  Patient is present today for a catheter change due to urinary retention.  8ml of water was removed from the balloon, a 16FR coude foley cath was removed without difficulty.  Patient was cleaned and prepped in a sterile fashion with betadine and 2% lidocaine  jelly was instilled into the urethra. A 16 FR coude foley cath was replaced into the bladder, no complications were noted. Urine return was noted 2ml and urine was yellow in color. The balloon was filled with 10ml of sterile water. A leg bag was attached for drainage.  Patient tolerated well.    Performed by: Lucie Hones, PA-C   Follow up: Return in about 4 weeks (around 07/19/2024) for Catheter exchange.

## 2024-07-02 ENCOUNTER — Other Ambulatory Visit: Payer: Self-pay | Admitting: Physician Assistant

## 2024-07-02 DIAGNOSIS — N3289 Other specified disorders of bladder: Secondary | ICD-10-CM

## 2024-07-16 ENCOUNTER — Other Ambulatory Visit: Payer: Self-pay | Admitting: Physician Assistant

## 2024-07-16 DIAGNOSIS — N3289 Other specified disorders of bladder: Secondary | ICD-10-CM

## 2024-07-19 ENCOUNTER — Ambulatory Visit (INDEPENDENT_AMBULATORY_CARE_PROVIDER_SITE_OTHER): Admitting: Physician Assistant

## 2024-07-19 VITALS — BP 154/87 | HR 76

## 2024-07-19 DIAGNOSIS — Z466 Encounter for fitting and adjustment of urinary device: Secondary | ICD-10-CM | POA: Diagnosis not present

## 2024-07-19 NOTE — Progress Notes (Signed)
 Cath Change/ Replacement  Patient is present today for a catheter change due to urinary retention.  8ml of water was removed from the balloon, a 16FR coude foley cath was removed without difficulty.  Patient was cleaned and prepped in a sterile fashion with betadine and 2% lidocaine  jelly was instilled into the urethra. A 16 FR coude foley cath was replaced into the bladder, no complications were noted. Urine return was noted 3ml and urine was yellow in color. The balloon was filled with 10ml of sterile water. A leg bag was attached for drainage.  Patient tolerated well.    Performed by: Ireta Pullman, PA-C   Follow up: Return in about 4 weeks (around 08/16/2024) for Catheter exchange.

## 2024-07-25 ENCOUNTER — Telehealth: Payer: Self-pay

## 2024-07-25 ENCOUNTER — Other Ambulatory Visit: Payer: Self-pay

## 2024-07-25 MED ORDER — CIPROFLOXACIN HCL 250 MG PO TABS: 250.0000 mg | ORAL_TABLET | Freq: Two times a day (BID) | ORAL | 0 refills | Status: DC

## 2024-07-25 NOTE — Telephone Encounter (Signed)
 Pts wife LM on triage line  She states pt  is acting very strange mentally. He's loopy. At times does not know her name. She thinks he may have a UTI.   LV 10/23 cath exchange with SV on 10/23.   Per SV can call in cipro  250 bid x 7.   Pts wife aware.  Med exred to [facility]. If no improvement in 4-5 days she will contact office.

## 2024-07-27 ENCOUNTER — Ambulatory Visit (INDEPENDENT_AMBULATORY_CARE_PROVIDER_SITE_OTHER): Admitting: Podiatry

## 2024-07-27 DIAGNOSIS — M2141 Flat foot [pes planus] (acquired), right foot: Secondary | ICD-10-CM

## 2024-07-27 DIAGNOSIS — M2142 Flat foot [pes planus] (acquired), left foot: Secondary | ICD-10-CM

## 2024-07-27 DIAGNOSIS — M2042 Other hammer toe(s) (acquired), left foot: Secondary | ICD-10-CM

## 2024-07-27 DIAGNOSIS — M2041 Other hammer toe(s) (acquired), right foot: Secondary | ICD-10-CM

## 2024-07-27 DIAGNOSIS — Z794 Long term (current) use of insulin: Secondary | ICD-10-CM

## 2024-07-27 DIAGNOSIS — E119 Type 2 diabetes mellitus without complications: Secondary | ICD-10-CM

## 2024-07-27 NOTE — Progress Notes (Signed)
 Patient of Dr. Tobie comes in today to pick up his diabetic shoes. There is supposed to be a half inch lift in the right shoe. The lift is not in the shoe. Otherwise the shoes fit well and are very comfortable for the patient.    The shoes were not dispensed today.  Mike Holloway is already scheduled with Dr. Tobie on Tuesday 08/14/24. His wife is 31 and has to drive from [place] for his appointments with us . I assured the patient and his wife that I would do my very best to get the shoes fitted with the appropriate lift and have them back in [place] to be dispensed on 08/14/24. They will call with any further questions or concerns.

## 2024-07-31 ENCOUNTER — Telehealth: Payer: Self-pay

## 2024-07-31 NOTE — Telephone Encounter (Signed)
 Patient came in Friday 07/27/24 to pick up diabetic shoes  There is supposed to be a half inch lift in the right shoe. The lift is not in the shoe. Otherwise the shoes fit well and are very comfortable for the patient.     The shoes were not dispensed Friday.   Mike Holloway is already scheduled with Dr. Tobie on Tuesday 08/14/24. His wife is 75 and has to drive from [place] for his appointments with us . I assured the patient and his wife that I would do my very best to get the shoes fitted with the appropriate lift and have them back in [place] to be dispensed on 08/14/24. I will send the shoes to [facility] with [facility]. Do you think you will be able to add the lift in the right shoe before his appointment on 11/18?

## 2024-08-01 ENCOUNTER — Telehealth: Payer: Self-pay

## 2024-08-01 NOTE — Telephone Encounter (Signed)
 Right shoe sent to [employer] to have 1 lift added  Shoe box and bag is in [place] office will need to send back to [facility] when back and patient can pick up  Ppw will need to be signed and sent to [facility]

## 2024-08-14 ENCOUNTER — Ambulatory Visit (INDEPENDENT_AMBULATORY_CARE_PROVIDER_SITE_OTHER): Admitting: Podiatry

## 2024-08-14 DIAGNOSIS — E119 Type 2 diabetes mellitus without complications: Secondary | ICD-10-CM

## 2024-08-14 DIAGNOSIS — Z794 Long term (current) use of insulin: Secondary | ICD-10-CM

## 2024-08-14 DIAGNOSIS — M79674 Pain in right toe(s): Secondary | ICD-10-CM

## 2024-08-14 DIAGNOSIS — M79675 Pain in left toe(s): Secondary | ICD-10-CM

## 2024-08-14 DIAGNOSIS — B351 Tinea unguium: Secondary | ICD-10-CM

## 2024-08-14 NOTE — Progress Notes (Signed)
  Subjective:  Patient ID: Mike Holloway, male    DOB: [birth year],  MRN: [id number]  Chief Complaint  Patient presents with   Nail Problem    Nail trim    83 y.o. male returns for the above complaint.  Patient presents with thickened elongated showing mycotic toenails x 10 mild pain on palpation ambulate for me to be done he is unable to do it himself.  He also has secondary complaint of getting diabetic shoes.  He is a diabetic with controlled A1c.  He would like to obtain diabetic shoes given his hammertoe contractures  Objective:  There were no vitals filed for this visit. Podiatric Exam: Vascular: dorsalis pedis and posterior tibial pulses are palpable bilateral. Capillary return is immediate. Temperature gradient is WNL. Skin turgor WNL  Sensorium: Normal Semmes Weinstein monofilament test. Normal tactile sensation bilaterally. Nail Exam: Pt has thick disfigured discolored nails with subungual debris noted bilateral entire nail hallux through fifth toenails.  Pain on palpation to the nails. Ulcer Exam: There is no evidence of ulcer or pre-ulcerative changes or infection. Orthopedic Exam: Muscle tone and strength are WNL. No limitations in general ROM. No crepitus or effusions noted.  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 No infection or ulcers    Assessment & Plan:   1. Controlled type 2 diabetes mellitus without complication, with long-term current use of insulin  (HCC)   2. Pain due to onychomycosis of toenails of both feet [B35.1, M79.675, M79.674]      Patient was evaluated and treated and all questions answered.  Hammertoe bilateral with underlying diabetes -All questions and concerns were discussed with the patient in extensive detail -Given the deformity and the contracture of the hammertoe in the setting of diabetes patient will benefit from diabetic shoes.   He will be scheduled to see Trish for diabetic shoes  Onychomycosis with pain  -Nails palliatively debrided as below. -Educated on self-care  Procedure: Nail Debridement Rationale: pain  Type of Debridement: manual, sharp debridement. Instrumentation: Nail nipper, rotary burr. Number of Nails: 10  Procedures and Treatment: Consent by patient was obtained for treatment procedures. The patient understood the discussion of treatment and procedures well. All questions were answered thoroughly reviewed. Debridement of mycotic and hypertrophic toenails, 1 through 5 bilateral and clearing of subungual debris. No ulceration, no infection noted.  Return Visit-Office Procedure: Patient instructed to return to the office for a follow up visit 3 months for continued evaluation and treatment.  Franky Blanch, DPM    No follow-ups on file.

## 2024-08-16 ENCOUNTER — Ambulatory Visit (INDEPENDENT_AMBULATORY_CARE_PROVIDER_SITE_OTHER): Admitting: Physician Assistant

## 2024-08-16 DIAGNOSIS — N3000 Acute cystitis without hematuria: Secondary | ICD-10-CM | POA: Diagnosis not present

## 2024-08-16 MED ORDER — FOSFOMYCIN TROMETHAMINE 3 G PO PACK: 3.0000 g | PACK | Freq: Once | ORAL | 0 refills | Status: AC

## 2024-08-16 NOTE — Progress Notes (Signed)
 Cath Change/ Replacement  Patient is present today for a catheter change due to urinary retention.  8ml of water was removed from the balloon, a 16FR coude foley cath was removed without difficulty.  Patient was cleaned and prepped in a sterile fashion with betadine and 2% lidocaine  jelly was instilled into the urethra. A 16 FR coude foley cath was replaced into the bladder, no complications were noted. Urine return was noted 15ml and urine was yellow in color. The balloon was filled with 10ml of sterile water. A leg bag was attached for drainage.  Patient tolerated well.    Performed by: Sueellen Kayes, PA-C  Additional notes: More confusion this week, concern for possible UTI. They are very reasonable in their expectations and understand it may be unrelated but his culture will be positive regardless. PCP prescribed culture appropriate Cipro  per recent culture, however he is unable to swallow pills so has not taken it.  Will send in empiric fosfomycin as an alternative.  Follow up: Return in about 4 weeks (around 09/13/2024) for Catheter exchange.

## 2024-08-18 ENCOUNTER — Other Ambulatory Visit: Payer: Self-pay | Admitting: Physician Assistant

## 2024-08-18 DIAGNOSIS — N3289 Other specified disorders of bladder: Secondary | ICD-10-CM

## 2024-08-21 ENCOUNTER — Telehealth: Payer: Self-pay

## 2024-08-21 NOTE — Telephone Encounter (Signed)
 Any fevers? Ok to bring him in for UA/CX, but I'm not convinced this is urologic either.

## 2024-08-21 NOTE — Telephone Encounter (Signed)
 Pt's wife called the triage line stating pt is not in his right mind, states he's not remembering who family members are or how to do help with his leg bag changes. Wife is concerned because this is something they do daily.  Pt took a dose of fosfomycin last Thursday. Pt's wife wanted to know if he needs to come in and give a another UA/UCX? Please advise.   Pt's wife states she talked with their PCP and neurologist and states this isn't a memory issue.

## 2024-08-22 ENCOUNTER — Ambulatory Visit: Payer: Self-pay | Admitting: Physician Assistant

## 2024-08-22 ENCOUNTER — Other Ambulatory Visit: Payer: Self-pay

## 2024-08-22 ENCOUNTER — Ambulatory Visit (INDEPENDENT_AMBULATORY_CARE_PROVIDER_SITE_OTHER): Admitting: Physician Assistant

## 2024-08-22 DIAGNOSIS — N3000 Acute cystitis without hematuria: Secondary | ICD-10-CM

## 2024-08-22 DIAGNOSIS — R3989 Other symptoms and signs involving the genitourinary system: Secondary | ICD-10-CM

## 2024-08-22 LAB — MICROSCOPIC EXAMINATION: WBC, UA: >30 /HPF — AB (ref 0–5)

## 2024-08-22 LAB — URINALYSIS, COMPLETE
Bilirubin, UA: NEGATIVE
Ketones, UA: NEGATIVE
Nitrite, UA: NEGATIVE
Specific Gravity, UA: 1.02 (ref 1.005–1.030)
Urobilinogen, Ur: 0.2 mg/dL (ref 0.2–1.0)
pH, UA: 6 (ref 5.0–7.5)

## 2024-08-22 NOTE — Telephone Encounter (Signed)
 Spoke with wife Mike Holloway and she denies any fevers chills nausea or vomiting. Office visit offered for UA/CX for 11/26 at 1:40 pm patient wife voiced understanding and accepted appointment

## 2024-08-22 NOTE — Addendum Note (Signed)
 Addended by: Cornella Emmer P on: 08/22/2024 03:15 PM   Modules accepted: Orders

## 2024-08-22 NOTE — Progress Notes (Signed)
 Pt present today for UA and culture. I was able to plug patients catheter and got sterile urine sample.

## 2024-08-27 LAB — CULTURE, URINE COMPREHENSIVE

## 2024-08-29 ENCOUNTER — Other Ambulatory Visit: Payer: Self-pay

## 2024-08-29 MED ORDER — CIPRO 250 MG/5ML (5%) PO SUSR: 250.0000 mg | Freq: Two times a day (BID) | ORAL | 0 refills | Status: AC

## 2024-08-29 NOTE — Progress Notes (Signed)
 Error

## 2024-08-29 NOTE — Telephone Encounter (Signed)
 Pts wife lm on triage line. She missed our call.   Pat aware of liquid cipro  sent to pharmacy. She was very appreciative of call.   She is requesting a recheck of his urine after completion of ATB. Aware I will ask SV and call her back.   Pls advise.

## 2024-08-29 NOTE — Telephone Encounter (Signed)
 Thank you for reminding me.  I sent in a prescription for Cipro  suspension as an alternative, so he may take this liquid instead.  He may skip the pills.  I sent the prescription to [facility] in [place].

## 2024-09-10 ENCOUNTER — Ambulatory Visit (INDEPENDENT_AMBULATORY_CARE_PROVIDER_SITE_OTHER): Admitting: Physician Assistant

## 2024-09-10 DIAGNOSIS — Z466 Encounter for fitting and adjustment of urinary device: Secondary | ICD-10-CM | POA: Diagnosis not present

## 2024-09-10 NOTE — Progress Notes (Signed)
 Cath Change/ Replacement  Patient is present today for a catheter change due to urinary retention.  8ml of water was removed from the balloon, a 16FR coude foley cath was removed without difficulty.  Patient was cleaned and prepped in a sterile fashion with betadine and 2% lidocaine  jelly was instilled into the urethra. A 16 FR coude foley cath was replaced into the bladder, no complications were noted. Urine return was noted 5ml and urine was yellow in color. The balloon was filled with 10ml of sterile water. A leg bag was attached for drainage.  Patient tolerated well.    Performed by: Ellese Julius, PA-C   Additional notes: He completed culture appropriate Cipro  suspension 4 days ago.  His confusion episodes have resolved.  Mrs. Deis requested urine rechecked today.  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  Additionally, with his history of C. difficile, he is going to be at risk for recurrence if we overmedicate him.  We discussed that I am happy to treat him for an infection when he is symptomatic, but it is not appropriate for us  to check the urine when he is asymptomatic.  Follow up: Return in about 4 weeks (around 10/08/2024) for Catheter exchange.

## 2024-09-11 ENCOUNTER — Ambulatory Visit: Admitting: Physician Assistant

## 2024-09-17 ENCOUNTER — Other Ambulatory Visit: Payer: Self-pay | Admitting: Physician Assistant

## 2024-09-17 DIAGNOSIS — N3289 Other specified disorders of bladder: Secondary | ICD-10-CM

## 2024-10-11 ENCOUNTER — Ambulatory Visit (INDEPENDENT_AMBULATORY_CARE_PROVIDER_SITE_OTHER): Admitting: Physician Assistant

## 2024-10-11 DIAGNOSIS — Z466 Encounter for fitting and adjustment of urinary device: Secondary | ICD-10-CM

## 2024-10-11 NOTE — Progress Notes (Addendum)
 Cath Change/ Replacement  Patient is present today for a catheter change due to urinary retention.  8ml of water was removed from the balloon, a 16FR coude foley cath was removed without difficulty.  Patient was cleaned and prepped in a sterile fashion with betadine and 2% lidocaine  jelly was instilled into the urethra. A 16 FR coude foley cath was replaced into the bladder, no complications were noted. Urine return was not noted. The balloon was filled with 10ml of sterile water. A leg bag was attached for drainage.  Patient tolerated well.    Performed by: Emiliana Blaize, PA-C and Gabrielle Aiden, PA-C  Follow up: Return in about 4 weeks (around 11/08/2024) for Catheter exchange.

## 2024-10-17 NOTE — Telephone Encounter (Signed)
 Shoe lift has been added and being sent to the [place] office. Mike Holloway has an appointment on 11/15/2024 with Dr. Tobie.

## 2024-11-02 ENCOUNTER — Other Ambulatory Visit: Payer: Self-pay | Admitting: Physician Assistant

## 2024-11-02 ENCOUNTER — Telehealth: Payer: Self-pay

## 2024-11-02 DIAGNOSIS — N3000 Acute cystitis without hematuria: Secondary | ICD-10-CM

## 2024-11-02 NOTE — Telephone Encounter (Signed)
 Pts wife Pat LM on triage line-   Pt was seen in ED at [facility]- dx with UTI. Bruna is requesting SV send in liquid cipro .   Per SV- provider at [facility] who ordered the ucx will need to order ATB.   Pat voiced understanding.

## 2024-11-08 ENCOUNTER — Ambulatory Visit: Admitting: Physician Assistant

## 2024-11-15 ENCOUNTER — Ambulatory Visit: Admitting: Podiatry

## 2024-12-06 ENCOUNTER — Ambulatory Visit: Admitting: Physician Assistant
